# Patient Record
Sex: Male | Born: 1970 | Race: Black or African American | Hispanic: No | Marital: Single | Smoking: Current some day smoker
Health system: Southern US, Community
[De-identification: ages and names within clinical notes are randomized; demographics above are authoritative.]

## PROBLEM LIST (undated history)

## (undated) DIAGNOSIS — F32A Depression, unspecified: Secondary | ICD-10-CM

## (undated) DIAGNOSIS — F329 Major depressive disorder, single episode, unspecified: Secondary | ICD-10-CM

## (undated) DIAGNOSIS — Z789 Other specified health status: Secondary | ICD-10-CM

## (undated) DIAGNOSIS — F1910 Other psychoactive substance abuse, uncomplicated: Secondary | ICD-10-CM

## (undated) DIAGNOSIS — F209 Schizophrenia, unspecified: Secondary | ICD-10-CM

## (undated) DIAGNOSIS — I4891 Unspecified atrial fibrillation: Secondary | ICD-10-CM

## (undated) DIAGNOSIS — F319 Bipolar disorder, unspecified: Secondary | ICD-10-CM

## (undated) DIAGNOSIS — J449 Chronic obstructive pulmonary disease, unspecified: Secondary | ICD-10-CM

## (undated) DIAGNOSIS — F419 Anxiety disorder, unspecified: Secondary | ICD-10-CM

## (undated) HISTORY — PX: NO PAST SURGERIES: SHX2092

---

## 2006-06-27 ENCOUNTER — Emergency Department: Payer: Self-pay | Admitting: Internal Medicine

## 2006-09-30 ENCOUNTER — Other Ambulatory Visit: Payer: Self-pay

## 2006-09-30 ENCOUNTER — Emergency Department: Payer: Self-pay | Admitting: Emergency Medicine

## 2008-07-23 ENCOUNTER — Emergency Department (HOSPITAL_COMMUNITY): Admission: EM | Admit: 2008-07-23 | Discharge: 2008-07-23 | Payer: Self-pay | Admitting: Emergency Medicine

## 2008-10-29 ENCOUNTER — Emergency Department (HOSPITAL_COMMUNITY): Admission: EM | Admit: 2008-10-29 | Discharge: 2008-10-29 | Payer: Self-pay | Admitting: Emergency Medicine

## 2008-12-30 ENCOUNTER — Emergency Department (HOSPITAL_COMMUNITY): Admission: EM | Admit: 2008-12-30 | Discharge: 2008-12-30 | Payer: Self-pay | Admitting: Emergency Medicine

## 2009-02-02 ENCOUNTER — Ambulatory Visit: Payer: Self-pay | Admitting: Infectious Diseases

## 2009-02-02 ENCOUNTER — Observation Stay (HOSPITAL_COMMUNITY): Admission: EM | Admit: 2009-02-02 | Discharge: 2009-02-03 | Payer: Self-pay | Admitting: Emergency Medicine

## 2009-02-20 ENCOUNTER — Ambulatory Visit: Payer: Self-pay | Admitting: Psychiatry

## 2009-02-20 ENCOUNTER — Emergency Department (HOSPITAL_COMMUNITY): Admission: EM | Admit: 2009-02-20 | Discharge: 2009-02-20 | Payer: Self-pay | Admitting: Emergency Medicine

## 2009-02-20 ENCOUNTER — Inpatient Hospital Stay (HOSPITAL_COMMUNITY): Admission: RE | Admit: 2009-02-20 | Discharge: 2009-02-24 | Payer: Self-pay | Admitting: Psychiatry

## 2009-04-30 ENCOUNTER — Emergency Department (HOSPITAL_COMMUNITY): Admission: EM | Admit: 2009-04-30 | Discharge: 2009-04-30 | Payer: Self-pay | Admitting: Emergency Medicine

## 2009-05-06 ENCOUNTER — Emergency Department (HOSPITAL_COMMUNITY): Admission: EM | Admit: 2009-05-06 | Discharge: 2009-05-06 | Payer: Self-pay | Admitting: Emergency Medicine

## 2009-07-24 ENCOUNTER — Emergency Department (HOSPITAL_COMMUNITY): Admission: EM | Admit: 2009-07-24 | Discharge: 2009-07-25 | Payer: Self-pay | Admitting: Emergency Medicine

## 2009-07-25 ENCOUNTER — Inpatient Hospital Stay (HOSPITAL_COMMUNITY): Admission: EM | Admit: 2009-07-25 | Discharge: 2009-07-28 | Payer: Self-pay | Admitting: Psychiatry

## 2009-07-25 ENCOUNTER — Ambulatory Visit: Payer: Self-pay | Admitting: Psychiatry

## 2009-08-02 ENCOUNTER — Emergency Department: Payer: Self-pay | Admitting: Internal Medicine

## 2009-08-02 ENCOUNTER — Other Ambulatory Visit: Payer: Self-pay

## 2009-08-13 ENCOUNTER — Emergency Department (HOSPITAL_COMMUNITY): Admission: EM | Admit: 2009-08-13 | Discharge: 2009-08-14 | Payer: Self-pay | Admitting: Emergency Medicine

## 2009-08-14 ENCOUNTER — Inpatient Hospital Stay (HOSPITAL_COMMUNITY): Admission: AD | Admit: 2009-08-14 | Discharge: 2009-08-18 | Payer: Self-pay | Admitting: Psychiatry

## 2009-09-22 ENCOUNTER — Emergency Department: Payer: Self-pay | Admitting: Unknown Physician Specialty

## 2009-10-04 ENCOUNTER — Inpatient Hospital Stay (HOSPITAL_COMMUNITY): Admission: AD | Admit: 2009-10-04 | Discharge: 2009-10-07 | Payer: Self-pay | Admitting: Psychiatry

## 2009-10-04 ENCOUNTER — Ambulatory Visit: Payer: Self-pay | Admitting: Psychiatry

## 2009-10-04 ENCOUNTER — Emergency Department (HOSPITAL_COMMUNITY): Admission: EM | Admit: 2009-10-04 | Discharge: 2009-10-04 | Payer: Self-pay | Admitting: Emergency Medicine

## 2009-11-12 ENCOUNTER — Ambulatory Visit: Payer: Self-pay | Admitting: Psychiatry

## 2009-11-12 ENCOUNTER — Emergency Department (HOSPITAL_COMMUNITY): Admission: EM | Admit: 2009-11-12 | Discharge: 2009-11-12 | Payer: Self-pay | Admitting: Emergency Medicine

## 2009-11-12 ENCOUNTER — Inpatient Hospital Stay (HOSPITAL_COMMUNITY): Admission: EM | Admit: 2009-11-12 | Discharge: 2009-11-17 | Payer: Self-pay | Admitting: Psychiatry

## 2009-12-18 ENCOUNTER — Emergency Department (HOSPITAL_COMMUNITY)
Admission: EM | Admit: 2009-12-18 | Discharge: 2009-12-19 | Payer: Self-pay | Source: Home / Self Care | Admitting: Emergency Medicine

## 2009-12-30 ENCOUNTER — Inpatient Hospital Stay: Payer: Self-pay | Admitting: Psychiatry

## 2010-01-31 ENCOUNTER — Inpatient Hospital Stay: Payer: Self-pay | Admitting: Psychiatry

## 2010-02-17 ENCOUNTER — Emergency Department (HOSPITAL_COMMUNITY)
Admission: EM | Admit: 2010-02-17 | Discharge: 2010-02-18 | Disposition: A | Discharge status: Other Acute Inpatient Hospital | Payer: Self-pay | Attending: Emergency Medicine | Admitting: Emergency Medicine

## 2010-02-17 DIAGNOSIS — R45851 Suicidal ideations: Secondary | ICD-10-CM | POA: Insufficient documentation

## 2010-02-17 LAB — URINALYSIS, ROUTINE W REFLEX MICROSCOPIC
Bilirubin Urine: NEGATIVE
Hgb urine dipstick: NEGATIVE
Nitrite: NEGATIVE
Protein, ur: NEGATIVE mg/dL
Specific Gravity, Urine: 1.02 (ref 1.005–1.030)
Urine Glucose, Fasting: NEGATIVE mg/dL
Urobilinogen, UA: 1 mg/dL (ref 0.0–1.0)
pH: 5 (ref 5.0–8.0)

## 2010-02-17 LAB — CBC
HCT: 40.8 % (ref 39.0–52.0)
Hemoglobin: 14.3 g/dL (ref 13.0–17.0)
MCH: 29.9 pg (ref 26.0–34.0)
MCHC: 35 g/dL (ref 30.0–36.0)
MCV: 85.2 fL (ref 78.0–100.0)
Platelets: 336 10*3/uL (ref 150–400)
RBC: 4.79 MIL/uL (ref 4.22–5.81)
RDW: 14.5 % (ref 11.5–15.5)
WBC: 8.3 10*3/uL (ref 4.0–10.5)

## 2010-02-17 LAB — DIFFERENTIAL
Basophils Absolute: 0.1 10*3/uL (ref 0.0–0.1)
Basophils Relative: 1 % (ref 0–1)
Eosinophils Absolute: 0.8 10*3/uL — ABNORMAL HIGH (ref 0.0–0.7)
Eosinophils Relative: 10 % — ABNORMAL HIGH (ref 0–5)
Lymphocytes Relative: 34 % (ref 12–46)
Lymphs Abs: 2.8 10*3/uL (ref 0.7–4.0)
Monocytes Absolute: 0.5 10*3/uL (ref 0.1–1.0)
Monocytes Relative: 6 % (ref 3–12)
Neutro Abs: 4 10*3/uL (ref 1.7–7.7)
Neutrophils Relative %: 49 % (ref 43–77)

## 2010-02-17 LAB — BASIC METABOLIC PANEL
BUN: 13 mg/dL (ref 6–23)
CO2: 25 mEq/L (ref 19–32)
Calcium: 9 mg/dL (ref 8.4–10.5)
Chloride: 104 mEq/L (ref 96–112)
Creatinine, Ser: 0.83 mg/dL (ref 0.4–1.5)
GFR calc Af Amer: >60 mL/min (ref >60)
GFR calc non Af Amer: >60 mL/min (ref >60)
Glucose, Bld: 79 mg/dL (ref 70–99)
Potassium: 3.9 mEq/L (ref 3.5–5.1)
Sodium: 141 mEq/L (ref 135–145)

## 2010-02-17 LAB — RAPID URINE DRUG SCREEN, HOSP PERFORMED
Amphetamines: NOT DETECTED
Barbiturates: NOT DETECTED
Benzodiazepines: NOT DETECTED
Cocaine: POSITIVE — AB
Opiates: NOT DETECTED
Tetrahydrocannabinol: POSITIVE — AB

## 2010-02-17 LAB — ETHANOL: Alcohol, Ethyl (B): 134 mg/dL — ABNORMAL HIGH (ref 0–10)

## 2010-02-18 ENCOUNTER — Inpatient Hospital Stay (HOSPITAL_COMMUNITY)
Admission: AD | Admit: 2010-02-18 | Discharge: 2010-02-23 | DRG: 897 | Disposition: A | Discharge status: Home / Self Care | Payer: PRIVATE HEALTH INSURANCE | Source: Ambulatory Visit | Attending: Psychiatry | Admitting: Psychiatry

## 2010-02-18 DIAGNOSIS — F29 Unspecified psychosis not due to a substance or known physiological condition: Secondary | ICD-10-CM

## 2010-02-18 DIAGNOSIS — R45851 Suicidal ideations: Secondary | ICD-10-CM

## 2010-02-18 DIAGNOSIS — F1020 Alcohol dependence, uncomplicated: Secondary | ICD-10-CM

## 2010-02-18 DIAGNOSIS — Z5900 Homelessness unspecified: Secondary | ICD-10-CM

## 2010-02-18 DIAGNOSIS — F319 Bipolar disorder, unspecified: Secondary | ICD-10-CM

## 2010-02-18 DIAGNOSIS — F1410 Cocaine abuse, uncomplicated: Secondary | ICD-10-CM

## 2010-02-18 DIAGNOSIS — F1994 Other psychoactive substance use, unspecified with psychoactive substance-induced mood disorder: Secondary | ICD-10-CM

## 2010-02-19 DIAGNOSIS — F1020 Alcohol dependence, uncomplicated: Secondary | ICD-10-CM

## 2010-02-24 NOTE — H&P (Signed)
NAMEJACQUISE, RARICK               ACCOUNT NO.:  [id number]  MEDICAL RECORD NO.:  [id number]           PATIENT TYPE:  I  LOCATION:  0305                          FACILITY:  [facility]  PHYSICIAN:  Anselm Jungling, MD  DATE OF BIRTH:  [birth year]  DATE OF ADMISSION:  02/18/2010 DATE OF DISCHARGE:                      PSYCHIATRIC ADMISSION ASSESSMENT   IDENTIFYING INFORMATION:  A 40 year old African American male, voluntary admission.  HISTORY OF PRESENT ILLNESS:  This is one of several [facility] admissions for Skylor, who was last with [place] in November of 2011, this is his fifth admission this year, presents having suicidal thoughts and auditory hallucinations, which he is attributing to the cocaine that he has been using.  He initially presented in the emergency room with some chest fullness and was medically cleared with no acute cardiac findings.  He endorses using cocaine and last used about an hour before presentation. He reports that he has been feeling suicidal thoughts about overdosing on a bottle of Seroquel that he has at home.  He also used alcohol this morning and has been drinking fairly regularly.  He reports drinking about a 12-pack of beer and using 100 dollars worth of crack cocaine several days a week for several weeks.  He had also stopped all of his medications for at least a month, which included previous prescriptions for Seroquel and Lexapro.  PAST PSYCHIATRIC HISTORY:  The fifth or so [facility] admission within the past year, persistent use of alcohol and cocaine.  Has been referred in the past to [facility].  SOCIAL HISTORY:  This is a single [place] American male, who had previously been living with his family.  No known legal problems.  He is unemployed.  A previous discharge plan of going to an [facility], but it is not clear if he followed through.  He does endorse a history of suicide attempts, which he will not detail.  FAMILY HISTORY:  Negative  for mental illness or substance abuse.  MEDICAL HISTORY:  No regular primary care provider.  MEDICAL PROBLEMS:  Chest pain associated with cocaine abuse with negative medical assessment.  MEDICATIONS:  None.  DRUG ALLERGIES:  None.  Physical exam was done in the emergency room and as noted in the record. Notably, his alcohol level was 134 mg/dL.  Basic metabolic panel normal, BUN 13, creatinine 0.83.  CBC is normal with a hemoglobin of 14.3. Urine drug screen positive for cocaine.  MENTAL STATUS EXAM:  Fully alert male, pleasant, cooperative, in full contact with reality today.  Endorsing auditory hallucinations associated with the cocaine.  He is disheveled and tremulous in appearance.  No overt evidence of a thought disorder or psychosis, asking for help, memory intact, fully oriented.  DIAGNOSES:  AXIS I: 1. Alcohol dependence. 2. Cocaine abuse, rule out dependence. 3. Rule out substance-induced psychosis.  AXIS II:  No diagnosis.  AXIS III:  No diagnosis.  AXIS IV:  Deferred. AXIS V:  Current 40, past year not known.  Plan is to voluntarily admit him with the goal of a safe detox and alleviating his psychotic symptoms.  We have placed him on  Risperdal 1 mg t.i.d. to address his auditory hallucinations and he is on a Librium detox protocol.  He is assigned to our detox unit.     Margaret A. Lorin Picket, N.P.   ______________________________ Anselm Jungling, MD    MAS/MEDQ  D:  02/19/2010  T:  02/19/2010  Job:  [id number]  Electronically Signed by Kari Baars N.P. on 02/23/2010 12:11:57 PM Electronically Signed by Geralyn Flash MD on 02/24/2010 08:31:27 AM

## 2010-02-25 NOTE — Discharge Summary (Signed)
  [name], [name]               ACCOUNT NO.:  [id number]  MEDICAL RECORD NO.:  [id number]           PATIENT TYPE:  I  LOCATION:  0305                          FACILITY:  BH  PHYSICIAN:  Franklin Jungling, MD  DATE OF BIRTH:  Oct 12, 1970  DATE OF ADMISSION:  02/18/2010 DATE OF DISCHARGE:  02/23/2010                              DISCHARGE SUMMARY   IDENTIFYING DATA AND REASON FOR ADMISSION:  This was one of several [facility] admissions for Franklin [name], a 40 year old single, homeless African American male.  He was admitted for treatment of alcohol dependence and cocaine abuse.  Please refer to the admission note for further details pertaining to the symptoms, circumstances, and history that led to his hospitalization.  He was given an initial Axis I diagnosis of alcohol dependence, cocaine abuse, and substance-induced mood disorder.  MEDICAL AND LABORATORY:  He was medically and physically assessed by the psychiatric nurse practitioner.  He was in good health without any active or chronic medical problems.  There were no significant medical issues during his stay.  HOSPITAL COURSE:  The patient was admitted to the Adult Inpatient Psychiatric Service.  He presented as a well-nourished, normally- developed adult male who was in alcohol withdrawal.  He was detoxified using standard alcohol withdrawal protocol based upon a Librium taper. He had previously done well on a regimen of Geodon, and this was restarted at 40 mg b.i.d.  He participated in therapeutic groups and activities, including those geared towards 12-step recovery.  He participated in discharge and after-care planning in conjunction with the case manager.  He completed his detoxification protocol, was absent any suicide risk factors, and on the sixth hospital day was appropriate for discharge. He agreed to the following after-care plan.  AFTER-CARE:  The patient was to follow up at [facility], and was also  referred to the __________  Program.  His plan was to stay with his mother in the interim while these treatment options were being accessed.  DISCHARGE MEDICATIONS:  Geodon 40 mg b.i.d.  DISCHARGE DIAGNOSES:  Axis I:  1.  Alcohol dependence.  2.  Cocaine abuse.  3.  Substance-induced mood disorder.  Rule out bipolar disorder not otherwise specified. Axis II:  Deferred. Axis III:  No acute or chronic illnesses. Axis IV:  Stressors severe. Axis V:  Global assessment of functioning on discharge 50.     Franklin Jungling, MD     SPB/MEDQ  D:  02/24/2010  T:  02/24/2010  Job:  [id number]  Electronically Signed by Franklin Flash MD on 02/25/2010 01:24:35 PM

## 2010-03-15 ENCOUNTER — Emergency Department (HOSPITAL_COMMUNITY)
Admission: EM | Admit: 2010-03-15 | Discharge: 2010-03-17 | Disposition: A | Discharge status: Home / Self Care | Payer: PRIVATE HEALTH INSURANCE | Attending: Emergency Medicine | Admitting: Emergency Medicine

## 2010-03-15 DIAGNOSIS — R45851 Suicidal ideations: Secondary | ICD-10-CM | POA: Insufficient documentation

## 2010-03-15 DIAGNOSIS — F1910 Other psychoactive substance abuse, uncomplicated: Secondary | ICD-10-CM | POA: Insufficient documentation

## 2010-03-15 DIAGNOSIS — F29 Unspecified psychosis not due to a substance or known physiological condition: Secondary | ICD-10-CM | POA: Insufficient documentation

## 2010-03-15 DIAGNOSIS — F3289 Other specified depressive episodes: Secondary | ICD-10-CM | POA: Insufficient documentation

## 2010-03-15 DIAGNOSIS — F329 Major depressive disorder, single episode, unspecified: Secondary | ICD-10-CM | POA: Insufficient documentation

## 2010-03-15 LAB — COMPREHENSIVE METABOLIC PANEL
ALT: 23 U/L (ref 0–53)
AST: 22 U/L (ref 0–37)
Albumin: 4.4 g/dL (ref 3.5–5.2)
Alkaline Phosphatase: 60 U/L (ref 39–117)
BUN: 6 mg/dL (ref 6–23)
CO2: 30 mEq/L (ref 19–32)
Calcium: 9.7 mg/dL (ref 8.4–10.5)
Chloride: 101 mEq/L (ref 96–112)
Creatinine, Ser: 0.9 mg/dL (ref 0.4–1.5)
GFR calc Af Amer: >60 mL/min (ref >60)
GFR calc non Af Amer: >60 mL/min (ref >60)
Glucose, Bld: 111 mg/dL — ABNORMAL HIGH (ref 70–99)
Potassium: 4.1 mEq/L (ref 3.5–5.1)
Sodium: 141 mEq/L (ref 135–145)
Total Bilirubin: 1.1 mg/dL (ref 0.3–1.2)
Total Protein: 7.8 g/dL (ref 6.0–8.3)

## 2010-03-15 LAB — DIFFERENTIAL
Basophils Absolute: 0.1 10*3/uL (ref 0.0–0.1)
Basophils Relative: 1 % (ref 0–1)
Eosinophils Absolute: 0.1 10*3/uL (ref 0.0–0.7)
Eosinophils Relative: 1 % (ref 0–5)
Lymphocytes Relative: 19 % (ref 12–46)
Lymphs Abs: 1.6 10*3/uL (ref 0.7–4.0)
Monocytes Absolute: 0.5 10*3/uL (ref 0.1–1.0)
Monocytes Relative: 6 % (ref 3–12)
Neutro Abs: 6 10*3/uL (ref 1.7–7.7)
Neutrophils Relative %: 73 % (ref 43–77)

## 2010-03-15 LAB — URINALYSIS, ROUTINE W REFLEX MICROSCOPIC
Bilirubin Urine: NEGATIVE
Glucose, UA: NEGATIVE mg/dL
Hgb urine dipstick: NEGATIVE
Ketones, ur: NEGATIVE mg/dL
Nitrite: NEGATIVE
Protein, ur: NEGATIVE mg/dL
Specific Gravity, Urine: 1.015 (ref 1.005–1.030)
Urobilinogen, UA: 0.2 mg/dL (ref 0.0–1.0)
pH: 6.5 (ref 5.0–8.0)

## 2010-03-15 LAB — CBC
HCT: 43.6 % (ref 39.0–52.0)
Hemoglobin: 14.3 g/dL (ref 13.0–17.0)
MCH: 29 pg (ref 26.0–34.0)
MCHC: 32.8 g/dL (ref 30.0–36.0)
MCV: 88.4 fL (ref 78.0–100.0)
Platelets: 321 10*3/uL (ref 150–400)
RBC: 4.93 MIL/uL (ref 4.22–5.81)
RDW: 14.5 % (ref 11.5–15.5)
WBC: 8.2 10*3/uL (ref 4.0–10.5)

## 2010-03-15 LAB — RAPID URINE DRUG SCREEN, HOSP PERFORMED
Amphetamines: NOT DETECTED
Barbiturates: NOT DETECTED
Benzodiazepines: NOT DETECTED
Cocaine: POSITIVE — AB
Opiates: NOT DETECTED
Tetrahydrocannabinol: NOT DETECTED

## 2010-03-15 LAB — ACETAMINOPHEN LEVEL: Acetaminophen (Tylenol), Serum: <10 ug/mL — ABNORMAL LOW (ref 10–30)

## 2010-03-15 LAB — SALICYLATE LEVEL: Salicylate Lvl: <4 mg/dL (ref 2.8–20.0)

## 2010-03-15 LAB — ETHANOL: Alcohol, Ethyl (B): 58 mg/dL — ABNORMAL HIGH (ref 0–10)

## 2010-03-16 DIAGNOSIS — F329 Major depressive disorder, single episode, unspecified: Secondary | ICD-10-CM

## 2010-03-16 LAB — RAPID URINE DRUG SCREEN, HOSP PERFORMED
Amphetamines: NOT DETECTED
Barbiturates: NOT DETECTED
Benzodiazepines: NOT DETECTED
Cocaine: POSITIVE — AB
Opiates: NOT DETECTED
Tetrahydrocannabinol: NOT DETECTED

## 2010-03-16 LAB — DIFFERENTIAL
Basophils Absolute: 0.1 10*3/uL (ref 0.0–0.1)
Basophils Relative: 1 % (ref 0–1)
Eosinophils Absolute: 1.1 10*3/uL — ABNORMAL HIGH (ref 0.0–0.7)
Eosinophils Relative: 11 % — ABNORMAL HIGH (ref 0–5)
Lymphocytes Relative: 25 % (ref 12–46)
Lymphs Abs: 2.3 10*3/uL (ref 0.7–4.0)
Monocytes Absolute: 0.7 10*3/uL (ref 0.1–1.0)
Monocytes Relative: 8 % (ref 3–12)
Neutro Abs: 5.1 10*3/uL (ref 1.7–7.7)
Neutrophils Relative %: 55 % (ref 43–77)

## 2010-03-16 LAB — CBC
HCT: 44.8 % (ref 39.0–52.0)
Hemoglobin: 15.3 g/dL (ref 13.0–17.0)
MCH: 29.9 pg (ref 26.0–34.0)
MCHC: 34.2 g/dL (ref 30.0–36.0)
MCV: 87.5 fL (ref 78.0–100.0)
Platelets: 308 10*3/uL (ref 150–400)
RBC: 5.12 MIL/uL (ref 4.22–5.81)
RDW: 13.6 % (ref 11.5–15.5)
WBC: 9.3 10*3/uL (ref 4.0–10.5)

## 2010-03-16 LAB — POCT CARDIAC MARKERS
CKMB, poc: <1 ng/mL — ABNORMAL LOW (ref 1.0–8.0)
Myoglobin, poc: 41.5 ng/mL (ref 12–200)
Troponin i, poc: <0.05 ng/mL (ref 0.00–0.09)

## 2010-03-16 LAB — BASIC METABOLIC PANEL
BUN: 4 mg/dL — ABNORMAL LOW (ref 6–23)
CO2: 29 mEq/L (ref 19–32)
Calcium: 9.9 mg/dL (ref 8.4–10.5)
Chloride: 102 mEq/L (ref 96–112)
Creatinine, Ser: 0.83 mg/dL (ref 0.4–1.5)
GFR calc Af Amer: >60 mL/min (ref >60)
GFR calc non Af Amer: >60 mL/min (ref >60)
Glucose, Bld: 80 mg/dL (ref 70–99)
Potassium: 4.3 mEq/L (ref 3.5–5.1)
Sodium: 140 mEq/L (ref 135–145)

## 2010-03-16 LAB — TRICYCLICS SCREEN, URINE: TCA Scrn: NOT DETECTED

## 2010-03-16 LAB — ETHANOL: Alcohol, Ethyl (B): 45 mg/dL — ABNORMAL HIGH (ref 0–10)

## 2010-03-17 DIAGNOSIS — F329 Major depressive disorder, single episode, unspecified: Secondary | ICD-10-CM

## 2010-03-17 LAB — CBC
HCT: 41.6 % (ref 39.0–52.0)
Hemoglobin: 14.1 g/dL (ref 13.0–17.0)
MCH: 30.7 pg (ref 26.0–34.0)
MCHC: 33.9 g/dL (ref 30.0–36.0)
MCV: 90.4 fL (ref 78.0–100.0)
Platelets: 278 10*3/uL (ref 150–400)
RBC: 4.6 MIL/uL (ref 4.22–5.81)
RDW: 14.6 % (ref 11.5–15.5)
WBC: 6.8 10*3/uL (ref 4.0–10.5)

## 2010-03-17 LAB — BASIC METABOLIC PANEL
BUN: 7 mg/dL (ref 6–23)
CO2: 27 mEq/L (ref 19–32)
Calcium: 9.4 mg/dL (ref 8.4–10.5)
Chloride: 105 mEq/L (ref 96–112)
Creatinine, Ser: 1.01 mg/dL (ref 0.4–1.5)
GFR calc Af Amer: >60 mL/min (ref >60)
GFR calc non Af Amer: >60 mL/min (ref >60)
Glucose, Bld: 73 mg/dL (ref 70–99)
Potassium: 4 mEq/L (ref 3.5–5.1)
Sodium: 142 mEq/L (ref 135–145)

## 2010-03-17 LAB — RAPID URINE DRUG SCREEN, HOSP PERFORMED
Amphetamines: NOT DETECTED
Barbiturates: NOT DETECTED
Benzodiazepines: NOT DETECTED
Cocaine: POSITIVE — AB
Opiates: NOT DETECTED
Tetrahydrocannabinol: POSITIVE — AB

## 2010-03-17 LAB — ETHANOL: Alcohol, Ethyl (B): <5 mg/dL (ref 0–10)

## 2010-03-17 LAB — DIFFERENTIAL
Basophils Absolute: 0.1 10*3/uL (ref 0.0–0.1)
Basophils Relative: 1 % (ref 0–1)
Eosinophils Absolute: 0.5 10*3/uL (ref 0.0–0.7)
Eosinophils Relative: 7 % — ABNORMAL HIGH (ref 0–5)
Lymphocytes Relative: 24 % (ref 12–46)
Lymphs Abs: 1.6 10*3/uL (ref 0.7–4.0)
Monocytes Absolute: 0.4 10*3/uL (ref 0.1–1.0)
Monocytes Relative: 6 % (ref 3–12)
Neutro Abs: 4.2 10*3/uL (ref 1.7–7.7)
Neutrophils Relative %: 62 % (ref 43–77)

## 2010-03-17 LAB — TRICYCLICS SCREEN, URINE: TCA Scrn: NOT DETECTED

## 2010-03-19 LAB — CBC
HCT: 43.6 % (ref 39.0–52.0)
Hemoglobin: 14.8 g/dL (ref 13.0–17.0)
MCH: 30.4 pg (ref 26.0–34.0)
MCHC: 33.9 g/dL (ref 30.0–36.0)
MCV: 89.7 fL (ref 78.0–100.0)
Platelets: 274 10*3/uL (ref 150–400)
RBC: 4.86 MIL/uL (ref 4.22–5.81)
RDW: 16.3 % — ABNORMAL HIGH (ref 11.5–15.5)
WBC: 5.2 10*3/uL (ref 4.0–10.5)

## 2010-03-19 LAB — COMPREHENSIVE METABOLIC PANEL
ALT: 20 U/L (ref 0–53)
AST: 28 U/L (ref 0–37)
Albumin: 4.2 g/dL (ref 3.5–5.2)
Alkaline Phosphatase: 68 U/L (ref 39–117)
BUN: 6 mg/dL (ref 6–23)
CO2: 31 mEq/L (ref 19–32)
Calcium: 9.4 mg/dL (ref 8.4–10.5)
Chloride: 103 mEq/L (ref 96–112)
Creatinine, Ser: 0.86 mg/dL (ref 0.4–1.5)
GFR calc Af Amer: >60 mL/min (ref >60)
GFR calc non Af Amer: >60 mL/min (ref >60)
Glucose, Bld: 109 mg/dL — ABNORMAL HIGH (ref 70–99)
Potassium: 4 mEq/L (ref 3.5–5.1)
Sodium: 142 mEq/L (ref 135–145)
Total Bilirubin: 0.6 mg/dL (ref 0.3–1.2)
Total Protein: 7.5 g/dL (ref 6.0–8.3)

## 2010-03-19 LAB — RAPID URINE DRUG SCREEN, HOSP PERFORMED
Amphetamines: NOT DETECTED
Barbiturates: NOT DETECTED
Benzodiazepines: POSITIVE — AB
Cocaine: POSITIVE — AB
Opiates: NOT DETECTED
Tetrahydrocannabinol: NOT DETECTED

## 2010-03-19 LAB — DIFFERENTIAL
Basophils Absolute: 0 10*3/uL (ref 0.0–0.1)
Basophils Relative: 1 % (ref 0–1)
Eosinophils Absolute: 0.3 10*3/uL (ref 0.0–0.7)
Eosinophils Relative: 6 % — ABNORMAL HIGH (ref 0–5)
Lymphocytes Relative: 27 % (ref 12–46)
Lymphs Abs: 1.4 10*3/uL (ref 0.7–4.0)
Monocytes Absolute: 0.4 10*3/uL (ref 0.1–1.0)
Monocytes Relative: 7 % (ref 3–12)
Neutro Abs: 3.1 10*3/uL (ref 1.7–7.7)
Neutrophils Relative %: 60 % (ref 43–77)

## 2010-03-19 LAB — ETHANOL: Alcohol, Ethyl (B): 9 mg/dL (ref 0–10)

## 2010-03-20 LAB — URINALYSIS, ROUTINE W REFLEX MICROSCOPIC
Bilirubin Urine: NEGATIVE
Glucose, UA: NEGATIVE mg/dL
Hgb urine dipstick: NEGATIVE
Ketones, ur: NEGATIVE mg/dL
Nitrite: NEGATIVE
Protein, ur: NEGATIVE mg/dL
Specific Gravity, Urine: 1.024 (ref 1.005–1.030)
Urobilinogen, UA: 1 mg/dL (ref 0.0–1.0)
pH: 5 (ref 5.0–8.0)

## 2010-03-20 LAB — CBC
HCT: 40 % (ref 39.0–52.0)
Hemoglobin: 13.8 g/dL (ref 13.0–17.0)
MCH: 30.8 pg (ref 26.0–34.0)
MCHC: 34.5 g/dL (ref 30.0–36.0)
MCV: 89.1 fL (ref 78.0–100.0)
Platelets: 338 10*3/uL (ref 150–400)
RBC: 4.49 MIL/uL (ref 4.22–5.81)
RDW: 14.6 % (ref 11.5–15.5)
WBC: 7.6 10*3/uL (ref 4.0–10.5)

## 2010-03-20 LAB — RAPID URINE DRUG SCREEN, HOSP PERFORMED
Amphetamines: NOT DETECTED
Barbiturates: NOT DETECTED
Benzodiazepines: NOT DETECTED
Cocaine: POSITIVE — AB
Opiates: NOT DETECTED
Tetrahydrocannabinol: NOT DETECTED

## 2010-03-20 LAB — COMPREHENSIVE METABOLIC PANEL
ALT: 19 U/L (ref 0–53)
AST: 22 U/L (ref 0–37)
Albumin: 4.1 g/dL (ref 3.5–5.2)
Alkaline Phosphatase: 60 U/L (ref 39–117)
BUN: 8 mg/dL (ref 6–23)
CO2: 25 mEq/L (ref 19–32)
Calcium: 9.2 mg/dL (ref 8.4–10.5)
Chloride: 103 mEq/L (ref 96–112)
Creatinine, Ser: 0.87 mg/dL (ref 0.4–1.5)
GFR calc Af Amer: >60 mL/min (ref >60)
GFR calc non Af Amer: >60 mL/min (ref >60)
Glucose, Bld: 144 mg/dL — ABNORMAL HIGH (ref 70–99)
Potassium: 3.4 mEq/L — ABNORMAL LOW (ref 3.5–5.1)
Sodium: 139 mEq/L (ref 135–145)
Total Bilirubin: 1.5 mg/dL — ABNORMAL HIGH (ref 0.3–1.2)
Total Protein: 6.8 g/dL (ref 6.0–8.3)

## 2010-03-20 LAB — DIFFERENTIAL
Basophils Absolute: 0.1 K/uL (ref 0.0–0.1)
Basophils Relative: 1 % (ref 0–1)
Eosinophils Absolute: 0.4 K/uL (ref 0.0–0.7)
Eosinophils Relative: 5 % (ref 0–5)
Lymphocytes Relative: 39 % (ref 12–46)
Lymphs Abs: 2.9 K/uL (ref 0.7–4.0)
Monocytes Absolute: 0.5 K/uL (ref 0.1–1.0)
Monocytes Relative: 6 % (ref 3–12)
Neutro Abs: 3.8 K/uL (ref 1.7–7.7)
Neutrophils Relative %: 49 % (ref 43–77)

## 2010-03-20 LAB — POCT CARDIAC MARKERS
CKMB, poc: <1 ng/mL — ABNORMAL LOW (ref 1.0–8.0)
Myoglobin, poc: 27.5 ng/mL (ref 12–200)
Troponin i, poc: <0.05 ng/mL (ref 0.00–0.09)

## 2010-03-20 LAB — CK TOTAL AND CKMB (NOT AT ARMC)
CK, MB: 2.9 ng/mL (ref 0.3–4.0)
Relative Index: 1.5 (ref 0.0–2.5)
Total CK: 197 U/L (ref 7–232)

## 2010-03-20 LAB — TROPONIN I: Troponin I: 0.02 ng/mL (ref 0.00–0.06)

## 2010-03-20 LAB — ETHANOL

## 2010-03-21 LAB — LIPID PANEL
Cholesterol: 172 mg/dL (ref 0–200)
HDL: 58 mg/dL (ref >39)
LDL Cholesterol: 83 mg/dL (ref 0–99)
Total CHOL/HDL Ratio: 3 RATIO
Triglycerides: 155 mg/dL — ABNORMAL HIGH (ref <150)
VLDL: 31 mg/dL (ref 0–40)

## 2010-03-21 LAB — CBC
HCT: 43.4 % (ref 39.0–52.0)
Hemoglobin: 14.8 g/dL (ref 13.0–17.0)
MCH: 30.3 pg (ref 26.0–34.0)
MCHC: 34.1 g/dL (ref 30.0–36.0)
MCV: 89 fL (ref 78.0–100.0)
Platelets: 297 10*3/uL (ref 150–400)
RBC: 4.88 MIL/uL (ref 4.22–5.81)
RDW: 13.9 % (ref 11.5–15.5)
WBC: 5.6 10*3/uL (ref 4.0–10.5)

## 2010-03-21 LAB — RAPID URINE DRUG SCREEN, HOSP PERFORMED
Amphetamines: NOT DETECTED
Barbiturates: NOT DETECTED
Benzodiazepines: NOT DETECTED
Cocaine: POSITIVE — AB
Opiates: NOT DETECTED
Tetrahydrocannabinol: NOT DETECTED

## 2010-03-21 LAB — DIFFERENTIAL
Basophils Absolute: 0.1 10*3/uL (ref 0.0–0.1)
Basophils Relative: 2 % — ABNORMAL HIGH (ref 0–1)
Eosinophils Absolute: 0.2 10*3/uL (ref 0.0–0.7)
Eosinophils Relative: 3 % (ref 0–5)
Lymphocytes Relative: 31 % (ref 12–46)
Lymphs Abs: 1.8 10*3/uL (ref 0.7–4.0)
Monocytes Absolute: 0.4 10*3/uL (ref 0.1–1.0)
Monocytes Relative: 8 % (ref 3–12)
Neutro Abs: 3.2 10*3/uL (ref 1.7–7.7)
Neutrophils Relative %: 56 % (ref 43–77)

## 2010-03-21 LAB — BASIC METABOLIC PANEL
BUN: 9 mg/dL (ref 6–23)
CO2: 30 mEq/L (ref 19–32)
Calcium: 9.1 mg/dL (ref 8.4–10.5)
Chloride: 107 mEq/L (ref 96–112)
Creatinine, Ser: 1.07 mg/dL (ref 0.4–1.5)
GFR calc Af Amer: >60 mL/min (ref >60)
GFR calc non Af Amer: >60 mL/min (ref >60)
Glucose, Bld: 105 mg/dL — ABNORMAL HIGH (ref 70–99)
Potassium: 4.1 mEq/L (ref 3.5–5.1)
Sodium: 142 mEq/L (ref 135–145)

## 2010-03-21 LAB — HEPATIC FUNCTION PANEL
ALT: 16 U/L (ref 0–53)
AST: 23 U/L (ref 0–37)
Albumin: 3.4 g/dL — ABNORMAL LOW (ref 3.5–5.2)
Alkaline Phosphatase: 54 U/L (ref 39–117)
Bilirubin, Direct: 0.1 mg/dL (ref 0.0–0.3)
Indirect Bilirubin: 0.6 mg/dL (ref 0.3–0.9)
Total Bilirubin: 0.7 mg/dL (ref 0.3–1.2)
Total Protein: 6 g/dL (ref 6.0–8.3)

## 2010-03-21 LAB — TSH: TSH: 2.677 u[IU]/mL (ref 0.350–4.500)

## 2010-03-21 LAB — ETHANOL: Alcohol, Ethyl (B): 6 mg/dL (ref 0–10)

## 2010-03-21 LAB — TRICYCLICS SCREEN, URINE: TCA Scrn: NOT DETECTED

## 2010-03-22 LAB — BASIC METABOLIC PANEL
BUN: 11 mg/dL (ref 6–23)
BUN: 14 mg/dL (ref 6–23)
CO2: 25 mEq/L (ref 19–32)
CO2: 30 mEq/L (ref 19–32)
Calcium: 9 mg/dL (ref 8.4–10.5)
Calcium: 9.1 mg/dL (ref 8.4–10.5)
Chloride: 101 mEq/L (ref 96–112)
Chloride: 105 mEq/L (ref 96–112)
Creatinine, Ser: 0.91 mg/dL (ref 0.4–1.5)
Creatinine, Ser: 1.01 mg/dL (ref 0.4–1.5)
GFR calc Af Amer: >60 mL/min (ref >60)
GFR calc Af Amer: >60 mL/min (ref >60)
GFR calc non Af Amer: >60 mL/min (ref >60)
GFR calc non Af Amer: >60 mL/min (ref >60)
Glucose, Bld: 111 mg/dL — ABNORMAL HIGH (ref 70–99)
Glucose, Bld: 87 mg/dL (ref 70–99)
Potassium: 3.7 mEq/L (ref 3.5–5.1)
Potassium: 3.7 mEq/L (ref 3.5–5.1)
Sodium: 136 mEq/L (ref 135–145)
Sodium: 141 mEq/L (ref 135–145)

## 2010-03-22 LAB — COMPREHENSIVE METABOLIC PANEL
ALT: 15 U/L (ref 0–53)
AST: 18 U/L (ref 0–37)
Albumin: 4 g/dL (ref 3.5–5.2)
Alkaline Phosphatase: 55 U/L (ref 39–117)
BUN: 8 mg/dL (ref 6–23)
CO2: 26 mEq/L (ref 19–32)
Calcium: 8.5 mg/dL (ref 8.4–10.5)
Chloride: 100 mEq/L (ref 96–112)
Creatinine, Ser: 0.73 mg/dL (ref 0.4–1.5)
GFR calc Af Amer: >60 mL/min (ref >60)
GFR calc non Af Amer: >60 mL/min (ref >60)
Glucose, Bld: 78 mg/dL (ref 70–99)
Potassium: 3.8 mEq/L (ref 3.5–5.1)
Sodium: 135 mEq/L (ref 135–145)
Total Bilirubin: 0.7 mg/dL (ref 0.3–1.2)
Total Protein: 6.5 g/dL (ref 6.0–8.3)

## 2010-03-22 LAB — CBC
HCT: 41 % (ref 39.0–52.0)
HCT: 45.6 % (ref 39.0–52.0)
Hemoglobin: 13.8 g/dL (ref 13.0–17.0)
Hemoglobin: 15.3 g/dL (ref 13.0–17.0)
MCHC: 33.6 g/dL (ref 30.0–36.0)
MCHC: 33.7 g/dL (ref 30.0–36.0)
MCV: 92.8 fL (ref 78.0–100.0)
MCV: 92.9 fL (ref 78.0–100.0)
Platelets: 232 10*3/uL (ref 150–400)
Platelets: 284 10*3/uL (ref 150–400)
RBC: 4.42 MIL/uL (ref 4.22–5.81)
RBC: 4.91 MIL/uL (ref 4.22–5.81)
RDW: 13.7 % (ref 11.5–15.5)
RDW: 14 % (ref 11.5–15.5)
WBC: 5.8 10*3/uL (ref 4.0–10.5)
WBC: 7.6 10*3/uL (ref 4.0–10.5)

## 2010-03-22 LAB — LIPID PANEL
Cholesterol: 133 mg/dL (ref 0–200)
HDL: 62 mg/dL (ref >39)
LDL Cholesterol: 60 mg/dL (ref 0–99)
Total CHOL/HDL Ratio: 2.1 RATIO
Triglycerides: 53 mg/dL (ref <150)
VLDL: 11 mg/dL (ref 0–40)

## 2010-03-22 LAB — DIFFERENTIAL
Basophils Absolute: 0 10*3/uL (ref 0.0–0.1)
Basophils Relative: 0 % (ref 0–1)
Eosinophils Absolute: 0.1 10*3/uL (ref 0.0–0.7)
Eosinophils Relative: 1 % (ref 0–5)
Lymphocytes Relative: 33 % (ref 12–46)
Lymphs Abs: 2.5 10*3/uL (ref 0.7–4.0)
Monocytes Absolute: 0.4 10*3/uL (ref 0.1–1.0)
Monocytes Relative: 5 % (ref 3–12)
Neutro Abs: 4.7 10*3/uL (ref 1.7–7.7)
Neutrophils Relative %: 61 % (ref 43–77)

## 2010-03-22 LAB — CARDIAC PANEL(CRET KIN+CKTOT+MB+TROPI)
CK, MB: 0.9 ng/mL (ref 0.3–4.0)
CK, MB: 1.3 ng/mL (ref 0.3–4.0)
Relative Index: INVALID (ref 0.0–2.5)
Relative Index: INVALID (ref 0.0–2.5)
Total CK: 56 U/L (ref 7–232)
Total CK: 67 U/L (ref 7–232)
Troponin I: 0.02 ng/mL (ref 0.00–0.06)
Troponin I: <0.01 ng/mL (ref 0.00–0.06)

## 2010-03-22 LAB — CK TOTAL AND CKMB (NOT AT ARMC)
CK, MB: 1.7 ng/mL (ref 0.3–4.0)
Relative Index: INVALID (ref 0.0–2.5)
Total CK: 79 U/L (ref 7–232)

## 2010-03-22 LAB — TSH: TSH: 0.801 u[IU]/mL (ref 0.350–4.500)

## 2010-03-22 LAB — RAPID URINE DRUG SCREEN, HOSP PERFORMED
Amphetamines: NOT DETECTED
Barbiturates: NOT DETECTED
Benzodiazepines: NOT DETECTED
Cocaine: POSITIVE — AB
Opiates: NOT DETECTED
Tetrahydrocannabinol: NOT DETECTED

## 2010-03-22 LAB — HEMOGLOBIN A1C
Hgb A1c MFr Bld: 5.7 % (ref 4.6–6.1)
Mean Plasma Glucose: 117 mg/dL

## 2010-03-22 LAB — POCT CARDIAC MARKERS
CKMB, poc: <1 ng/mL — ABNORMAL LOW (ref 1.0–8.0)
Myoglobin, poc: 20.7 ng/mL (ref 12–200)
Troponin i, poc: <0.05 ng/mL (ref 0.00–0.09)

## 2010-03-22 LAB — HEPATITIS C ANTIBODY: HCV Ab: NEGATIVE

## 2010-03-22 LAB — PROTIME-INR
INR: 1 (ref 0.00–1.49)
Prothrombin Time: 13.1 seconds (ref 11.6–15.2)

## 2010-03-22 LAB — APTT: aPTT: 26 seconds (ref 24–37)

## 2010-03-22 LAB — TROPONIN I: Troponin I: <0.01 ng/mL (ref 0.00–0.06)

## 2010-03-22 LAB — HEPATITIS B SURFACE ANTIBODY,QUALITATIVE: Hep B S Ab: NEGATIVE

## 2010-03-22 LAB — RPR: RPR Ser Ql: NONREACTIVE

## 2010-03-22 LAB — MAGNESIUM: Magnesium: 2.1 mg/dL (ref 1.5–2.5)

## 2010-03-22 LAB — ETHANOL: Alcohol, Ethyl (B): 71 mg/dL — ABNORMAL HIGH (ref 0–10)

## 2010-03-25 LAB — URINALYSIS, ROUTINE W REFLEX MICROSCOPIC
Bilirubin Urine: NEGATIVE
Glucose, UA: NEGATIVE mg/dL
Hgb urine dipstick: NEGATIVE
Nitrite: NEGATIVE
Protein, ur: NEGATIVE mg/dL
Specific Gravity, Urine: 1.022 (ref 1.005–1.030)
Urobilinogen, UA: 0.2 mg/dL (ref 0.0–1.0)
pH: 5.5 (ref 5.0–8.0)

## 2010-03-25 LAB — CBC
HCT: 41.1 % (ref 39.0–52.0)
Hemoglobin: 14.1 g/dL (ref 13.0–17.0)
MCHC: 34.2 g/dL (ref 30.0–36.0)
MCV: 89.5 fL (ref 78.0–100.0)
Platelets: 293 10*3/uL (ref 150–400)
RBC: 4.59 MIL/uL (ref 4.22–5.81)
RDW: 14 % (ref 11.5–15.5)
WBC: 6.9 10*3/uL (ref 4.0–10.5)

## 2010-03-25 LAB — DIFFERENTIAL
Basophils Absolute: 0 10*3/uL (ref 0.0–0.1)
Basophils Relative: 1 % (ref 0–1)
Eosinophils Absolute: 0.2 10*3/uL (ref 0.0–0.7)
Eosinophils Relative: 2 % (ref 0–5)
Lymphocytes Relative: 38 % (ref 12–46)
Lymphs Abs: 2.6 10*3/uL (ref 0.7–4.0)
Monocytes Absolute: 0.5 10*3/uL (ref 0.1–1.0)
Monocytes Relative: 8 % (ref 3–12)
Neutro Abs: 3.6 10*3/uL (ref 1.7–7.7)
Neutrophils Relative %: 51 % (ref 43–77)

## 2010-03-25 LAB — COMPREHENSIVE METABOLIC PANEL
ALT: 17 U/L (ref 0–53)
AST: 21 U/L (ref 0–37)
Albumin: 3.9 g/dL (ref 3.5–5.2)
Alkaline Phosphatase: 60 U/L (ref 39–117)
BUN: 10 mg/dL (ref 6–23)
CO2: 26 mEq/L (ref 19–32)
Calcium: 9.1 mg/dL (ref 8.4–10.5)
Chloride: 105 mEq/L (ref 96–112)
Creatinine, Ser: 0.77 mg/dL (ref 0.4–1.5)
GFR calc Af Amer: >60 mL/min (ref >60)
GFR calc non Af Amer: >60 mL/min (ref >60)
Glucose, Bld: 84 mg/dL (ref 70–99)
Potassium: 3.5 mEq/L (ref 3.5–5.1)
Sodium: 139 mEq/L (ref 135–145)
Total Bilirubin: 1 mg/dL (ref 0.3–1.2)
Total Protein: 6.7 g/dL (ref 6.0–8.3)

## 2010-03-25 LAB — RAPID URINE DRUG SCREEN, HOSP PERFORMED
Amphetamines: NOT DETECTED
Barbiturates: NOT DETECTED
Benzodiazepines: POSITIVE — AB
Cocaine: POSITIVE — AB
Opiates: NOT DETECTED
Tetrahydrocannabinol: NOT DETECTED

## 2010-03-25 LAB — ETHANOL: Alcohol, Ethyl (B): 10 mg/dL (ref 0–10)

## 2010-04-06 LAB — CBC
HCT: 44.3 % (ref 39.0–52.0)
Hemoglobin: 14.9 g/dL (ref 13.0–17.0)
MCHC: 33.6 g/dL (ref 30.0–36.0)
MCV: 91 fL (ref 78.0–100.0)
Platelets: 237 10*3/uL (ref 150–400)
RBC: 4.86 MIL/uL (ref 4.22–5.81)
RDW: 13.9 % (ref 11.5–15.5)
WBC: 10.1 10*3/uL (ref 4.0–10.5)

## 2010-04-06 LAB — DIFFERENTIAL
Basophils Absolute: 0 10*3/uL (ref 0.0–0.1)
Basophils Relative: 0 % (ref 0–1)
Eosinophils Absolute: 0.1 10*3/uL (ref 0.0–0.7)
Eosinophils Relative: 1 % (ref 0–5)
Lymphocytes Relative: 24 % (ref 12–46)
Lymphs Abs: 2.4 10*3/uL (ref 0.7–4.0)
Monocytes Absolute: 0.6 10*3/uL (ref 0.1–1.0)
Monocytes Relative: 6 % (ref 3–12)
Neutro Abs: 6.8 10*3/uL (ref 1.7–7.7)
Neutrophils Relative %: 68 % (ref 43–77)

## 2010-04-06 LAB — BASIC METABOLIC PANEL
BUN: 9 mg/dL (ref 6–23)
CO2: 29 mEq/L (ref 19–32)
Calcium: 9.4 mg/dL (ref 8.4–10.5)
Chloride: 101 mEq/L (ref 96–112)
Creatinine, Ser: 0.85 mg/dL (ref 0.4–1.5)
GFR calc Af Amer: >60 mL/min (ref >60)
GFR calc non Af Amer: >60 mL/min (ref >60)
Glucose, Bld: 94 mg/dL (ref 70–99)
Potassium: 4 mEq/L (ref 3.5–5.1)
Sodium: 137 mEq/L (ref 135–145)

## 2010-04-06 LAB — POCT CARDIAC MARKERS
CKMB, poc: 1.9 ng/mL (ref 1.0–8.0)
CKMB, poc: 2 ng/mL (ref 1.0–8.0)
Myoglobin, poc: 90.9 ng/mL (ref 12–200)
Myoglobin, poc: 93.6 ng/mL (ref 12–200)
Troponin i, poc: <0.05 ng/mL (ref 0.00–0.09)
Troponin i, poc: <0.05 ng/mL (ref 0.00–0.09)

## 2010-04-09 ENCOUNTER — Emergency Department (HOSPITAL_COMMUNITY): Payer: Self-pay

## 2010-04-09 ENCOUNTER — Emergency Department (HOSPITAL_COMMUNITY)
Admission: EM | Admit: 2010-04-09 | Discharge: 2010-04-09 | Disposition: A | Discharge status: Other Acute Inpatient Hospital | Payer: Self-pay | Attending: Emergency Medicine | Admitting: Emergency Medicine

## 2010-04-09 ENCOUNTER — Inpatient Hospital Stay (HOSPITAL_COMMUNITY)
Admission: AD | Admit: 2010-04-09 | Discharge: 2010-04-13 | DRG: 897 | Disposition: A | Discharge status: Home / Self Care | Payer: PRIVATE HEALTH INSURANCE | Source: Ambulatory Visit | Attending: Psychiatry | Admitting: Psychiatry

## 2010-04-09 DIAGNOSIS — F341 Dysthymic disorder: Secondary | ICD-10-CM | POA: Insufficient documentation

## 2010-04-09 DIAGNOSIS — F1010 Alcohol abuse, uncomplicated: Secondary | ICD-10-CM | POA: Insufficient documentation

## 2010-04-09 DIAGNOSIS — F1410 Cocaine abuse, uncomplicated: Secondary | ICD-10-CM

## 2010-04-09 DIAGNOSIS — R45851 Suicidal ideations: Secondary | ICD-10-CM

## 2010-04-09 DIAGNOSIS — F1910 Other psychoactive substance abuse, uncomplicated: Secondary | ICD-10-CM

## 2010-04-09 DIAGNOSIS — F329 Major depressive disorder, single episode, unspecified: Secondary | ICD-10-CM

## 2010-04-09 DIAGNOSIS — R079 Chest pain, unspecified: Secondary | ICD-10-CM | POA: Insufficient documentation

## 2010-04-09 DIAGNOSIS — F3289 Other specified depressive episodes: Secondary | ICD-10-CM

## 2010-04-09 DIAGNOSIS — F1020 Alcohol dependence, uncomplicated: Principal | ICD-10-CM

## 2010-04-09 LAB — DIFFERENTIAL
Basophils Absolute: 0.1 10*3/uL (ref 0.0–0.1)
Basophils Absolute: 0 10*3/uL (ref 0.0–0.1)
Basophils Relative: 1 % (ref 0–1)
Basophils Relative: 1 % (ref 0–1)
Eosinophils Absolute: 0.4 10*3/uL (ref 0.0–0.7)
Eosinophils Absolute: 0.1 10*3/uL (ref 0.0–0.7)
Eosinophils Relative: 4 % (ref 0–5)
Eosinophils Relative: 2 % (ref 0–5)
Lymphocytes Relative: 18 % (ref 12–46)
Lymphocytes Relative: 37 % (ref 12–46)
Lymphs Abs: 1.9 10*3/uL (ref 0.7–4.0)
Lymphs Abs: 2.7 10*3/uL (ref 0.7–4.0)
Monocytes Absolute: 0.9 10*3/uL (ref 0.1–1.0)
Monocytes Absolute: 0.5 10*3/uL (ref 0.1–1.0)
Monocytes Relative: 8 % (ref 3–12)
Monocytes Relative: 7 % (ref 3–12)
Neutro Abs: 7 10*3/uL (ref 1.7–7.7)
Neutro Abs: 3.9 10*3/uL (ref 1.7–7.7)
Neutrophils Relative %: 69 % (ref 43–77)
Neutrophils Relative %: 54 % (ref 43–77)

## 2010-04-09 LAB — RAPID URINE DRUG SCREEN, HOSP PERFORMED
Amphetamines: NOT DETECTED
Amphetamines: NOT DETECTED
Barbiturates: NOT DETECTED
Barbiturates: NOT DETECTED
Benzodiazepines: NOT DETECTED
Benzodiazepines: NOT DETECTED
Cocaine: POSITIVE — AB
Cocaine: POSITIVE — AB
Opiates: NOT DETECTED
Opiates: NOT DETECTED
Tetrahydrocannabinol: POSITIVE — AB
Tetrahydrocannabinol: POSITIVE — AB

## 2010-04-09 LAB — BASIC METABOLIC PANEL
BUN: 11 mg/dL (ref 6–23)
BUN: 7 mg/dL (ref 6–23)
CO2: 26 mEq/L (ref 19–32)
CO2: 29 mEq/L (ref 19–32)
Calcium: 9.1 mg/dL (ref 8.4–10.5)
Calcium: 9.5 mg/dL (ref 8.4–10.5)
Chloride: 105 mEq/L (ref 96–112)
Chloride: 100 mEq/L (ref 96–112)
Creatinine, Ser: 0.92 mg/dL (ref 0.4–1.5)
Creatinine, Ser: 0.88 mg/dL (ref 0.4–1.5)
GFR calc Af Amer: >60 mL/min (ref >60)
GFR calc Af Amer: >60 mL/min (ref >60)
GFR calc non Af Amer: >60 mL/min (ref >60)
GFR calc non Af Amer: >60 mL/min (ref >60)
Glucose, Bld: 92 mg/dL (ref 70–99)
Glucose, Bld: 96 mg/dL (ref 70–99)
Potassium: 4.2 mEq/L (ref 3.5–5.1)
Potassium: 3.4 mEq/L — ABNORMAL LOW (ref 3.5–5.1)
Sodium: 138 mEq/L (ref 135–145)
Sodium: 137 mEq/L (ref 135–145)

## 2010-04-09 LAB — URINALYSIS, ROUTINE W REFLEX MICROSCOPIC
Bilirubin Urine: NEGATIVE
Glucose, UA: NEGATIVE mg/dL
Hgb urine dipstick: NEGATIVE
Ketones, ur: NEGATIVE mg/dL
Nitrite: NEGATIVE
Protein, ur: NEGATIVE mg/dL
Specific Gravity, Urine: 1.021 (ref 1.005–1.030)
Urobilinogen, UA: 1 mg/dL (ref 0.0–1.0)
pH: 6 (ref 5.0–8.0)

## 2010-04-09 LAB — CBC
HCT: 43.2 % (ref 39.0–52.0)
HCT: 44.8 % (ref 39.0–52.0)
Hemoglobin: 14.7 g/dL (ref 13.0–17.0)
Hemoglobin: 15.1 g/dL (ref 13.0–17.0)
MCH: 30.1 pg (ref 26.0–34.0)
MCHC: 34 g/dL (ref 30.0–36.0)
MCHC: 33.6 g/dL (ref 30.0–36.0)
MCV: 88.5 fL (ref 78.0–100.0)
MCV: 91.3 fL (ref 78.0–100.0)
Platelets: 290 10*3/uL (ref 150–400)
Platelets: 261 10*3/uL (ref 150–400)
RBC: 4.88 MIL/uL (ref 4.22–5.81)
RBC: 4.9 MIL/uL (ref 4.22–5.81)
RDW: 13.8 % (ref 11.5–15.5)
RDW: 14.7 % (ref 11.5–15.5)
WBC: 10.2 10*3/uL (ref 4.0–10.5)
WBC: 7.3 10*3/uL (ref 4.0–10.5)

## 2010-04-09 LAB — POCT CARDIAC MARKERS
CKMB, poc: 2.4 ng/mL (ref 1.0–8.0)
CKMB, poc: 2.2 ng/mL (ref 1.0–8.0)
Myoglobin, poc: 60.4 ng/mL (ref 12–200)
Myoglobin, poc: 34.8 ng/mL (ref 12–200)
Troponin i, poc: <0.05 ng/mL (ref 0.00–0.09)
Troponin i, poc: <0.05 ng/mL (ref 0.00–0.09)

## 2010-04-09 LAB — ETHANOL
Alcohol, Ethyl (B): 29 mg/dL — ABNORMAL HIGH (ref 0–10)
Alcohol, Ethyl (B): <5 mg/dL (ref 0–10)

## 2010-04-10 DIAGNOSIS — F1994 Other psychoactive substance use, unspecified with psychoactive substance-induced mood disorder: Secondary | ICD-10-CM

## 2010-04-10 DIAGNOSIS — F1920 Other psychoactive substance dependence, uncomplicated: Secondary | ICD-10-CM

## 2010-04-12 LAB — POCT I-STAT, CHEM 8
BUN: 6 mg/dL (ref 6–23)
Calcium, Ion: 1.18 mmol/L (ref 1.12–1.32)
Chloride: 104 mEq/L (ref 96–112)
Creatinine, Ser: 1 mg/dL (ref 0.4–1.5)
Glucose, Bld: 124 mg/dL — ABNORMAL HIGH (ref 70–99)
HCT: 50 % (ref 39.0–52.0)
Hemoglobin: 17 g/dL (ref 13.0–17.0)
Potassium: 3.9 mEq/L (ref 3.5–5.1)
Sodium: 141 mEq/L (ref 135–145)
TCO2: 26 mmol/L (ref 0–100)

## 2010-04-12 LAB — CBC
HCT: 46.8 % (ref 39.0–52.0)
Hemoglobin: 15.6 g/dL (ref 13.0–17.0)
MCHC: 33.3 g/dL (ref 30.0–36.0)
MCV: 90.8 fL (ref 78.0–100.0)
Platelets: 275 10*3/uL (ref 150–400)
RBC: 5.16 MIL/uL (ref 4.22–5.81)
RDW: 13.8 % (ref 11.5–15.5)
WBC: 5.9 10*3/uL (ref 4.0–10.5)

## 2010-04-12 LAB — DIFFERENTIAL
Basophils Absolute: 0 10*3/uL (ref 0.0–0.1)
Basophils Relative: 0 % (ref 0–1)
Eosinophils Absolute: 0.1 10*3/uL (ref 0.0–0.7)
Eosinophils Relative: 1 % (ref 0–5)
Lymphocytes Relative: 23 % (ref 12–46)
Lymphs Abs: 1.3 10*3/uL (ref 0.7–4.0)
Monocytes Absolute: 0.4 10*3/uL (ref 0.1–1.0)
Monocytes Relative: 7 % (ref 3–12)
Neutro Abs: 4 10*3/uL (ref 1.7–7.7)
Neutrophils Relative %: 69 % (ref 43–77)

## 2010-04-12 LAB — URINE MICROSCOPIC-ADD ON

## 2010-04-12 LAB — RAPID URINE DRUG SCREEN, HOSP PERFORMED
Amphetamines: NOT DETECTED
Barbiturates: NOT DETECTED
Benzodiazepines: NOT DETECTED
Cocaine: POSITIVE — AB
Opiates: NOT DETECTED
Tetrahydrocannabinol: NOT DETECTED

## 2010-04-12 LAB — URINALYSIS, ROUTINE W REFLEX MICROSCOPIC
Glucose, UA: NEGATIVE mg/dL
Ketones, ur: NEGATIVE mg/dL
Leukocytes, UA: NEGATIVE
Nitrite: NEGATIVE
Protein, ur: NEGATIVE mg/dL
Specific Gravity, Urine: 1.026 (ref 1.005–1.030)
Urobilinogen, UA: 1 mg/dL (ref 0.0–1.0)
pH: 5.5 (ref 5.0–8.0)

## 2010-04-12 LAB — ETHANOL: Alcohol, Ethyl (B): <5 mg/dL (ref 0–10)

## 2010-04-24 NOTE — H&P (Signed)
NAMEMARKELL, [name] NO.:  [id number]  MEDICAL RECORD NO.:  [id number]           PATIENT TYPE:  I  LOCATION:  0304                          FACILITY:  BH  PHYSICIAN:  Franklin Jungling, MD  DATE OF BIRTH:  June 20, 1970  DATE OF ADMISSION:  04/09/2010 DATE OF DISCHARGE:                      PSYCHIATRIC ADMISSION ASSESSMENT   The patient is a 40 year old African American male who presented to the [facility] emergency room requesting detox.  The patient states that he had been drinking a case of beer a day for the last 2-3 weeks as well as using a gram of crack cocaine for the last 2-3 weeks.  The patient was recently admitted to [facility] and discharged in February of this year.  The patient states he relapsed.  He has had multiple admissions in the past, this is one of many currently.  He lives with his mother.  He is single.  He has had 2 years of college, is unemployed, and he has a court date this month currently pending for larceny.  ALCOHOL HISTORY:  Again, 1 case of beer a day for the last 2-3 weeks and at least 1 gram of crack every day for the last 2-3 weeks.  Currently he has no primary care provider and denies any ongoing health problems.  MEDICATIONS ARE:  Lexapro and Geodon.  He has no known drug allergies.  He was evaluated in the emergency room and review of systems was negative.  Listed medications included trazodone, Geodon and Lexapro. Examination was unremarkable done by Dr. Emeline [name].  EKG and cardiac markers are well within normal limits.  Chest x-ray:  No evidence of acute pulmonary disease.  CBC with differential unremarkable.  Alcohol:  Blood alcohol level was 29.  Basic metabolic profile was normal.  The patient was given an urine drug screen, which was positive for cocaine and cannabis.  CURRENT PSYCHIATRIC EVALUATION:  He is alert and oriented x3.  He is covered up in bed, stating that he does not feel well and  is suffering from alcohol withdrawal.  Says he has occasional suicidal ideation on and off.  He has last attempted suicide a year ago by hanging.  He is a well-developed well-nourished 40 year old African American male covered up in bed.  Speech is clear.  Fair eye contact.  Mood is depressed. Affect is congruent.  Thought process is linear without auditory or visual hallucinations.  Cognition is average.  ASSESSMENT:  Axis I:  Polysubstance abuse with alcohol dependency. Axis II:  None. Axis III:  None. Axis IV:  Legal issue. Axis V:  Current GAF 45, past year unknown.  PLAN:  The patient will be detoxed via the Librium protocol.  He will be restarted on previous psychiatric medications including Geodon and Lexapro.  Careful watchful waiting.  Expected length of stay 2 to 3 days.    ______________________________ Franklin Spurr, PA   ______________________________ Franklin Jungling, MD    [name]  D:  04/10/2010  T:  04/10/2010  Job:  [id number]  Electronically Signed by Franklin [name]  on 04/20/2010 04:02:41 PM Electronically Signed by Franklin Flash MD  on 04/24/2010 11:37:48 AM

## 2010-06-13 NOTE — Discharge Summary (Signed)
  Franklin, [name]               ACCOUNT NO.:  [id number]  MEDICAL RECORD NO.:  [id number]           PATIENT TYPE:  I  LOCATION:  0304                          FACILITY:  BH  PHYSICIAN:  Anselm Jungling, MD  DATE OF BIRTH:  Jan 16, 1970  DATE OF ADMISSION:  04/09/2010 DATE OF DISCHARGE:  04/13/2010                              DISCHARGE SUMMARY   IDENTIFYING INFORMATION:  This is a 40 year old African American male. This is a voluntary admission.  HISTORY OF PRESENT ILLNESS:  Franklin [name] presented by way of our emergency room requesting detox from alcohol, drinking about a case of beer most days of the week for the last 2-3 weeks as well as using a gram of cocaine for the last 2-3 weeks.  He had been at [facility] previously in February of this year and had been sober for an unclear period of time and subsequently relapsed.  He is not currently involved in any outpatient treatment or in AA support groups.  MEDICAL EVALUATION AND DIAGNOSTIC STUDIES:  He was medically cleared in our emergency room. An  EKG and cardiac markers were within normal limits.  Chest x-ray negative.  CBC unremarkable.  Chemistries normal with normal renal function.  Blood alcohol level 29 mg/dL. A urine drug screen positive for cocaine and cannabis.  COURSE OF HOSPITALIZATION:  He was admitted to our detox unit and gradually assimilated into the milieu.  He was started on a Librium protocol with a goal of a safe detox from alcohol and substances within 4 days.  He was started back on his Lexapro 10 mg daily for depression and Geodon 40 mg b.i.d. for augmentation of his antidepressant.  Group participation was good.  His interaction with peers and staff was appropriate while here.  He identified the [facility] as a resource that helped him to maintain sobriety and expressed his desire to return there.  His detox was marked by withdrawal symptoms of nausea which gradually subsided without complication.  He  gave [place] permission to speak with his family who accepted suicide prevention information.  By the 9th he had no dangerous thoughts,  no further withdrawal symptoms and felt ready to return with his mother home with a follow-up at the [facility].  PLAN:  Discharge plan was to follow up at the [facility] on April 9 at 2:11 a.m.  DISCHARGE MEDICATIONS:  Geodon 40 mg b.i.d. Lexapro 20 mg daily, trazodone 100 mg bedtime  p.r.n. insomnia.  DISCHARGE DIAGNOSIS:  AXIS I:  Polysubstance abuse and dependence. Depressive disorder NOS, rule out substance-induced mood disorder. AXIS II: No diagnosis. AXIS III: No diagnosis. AXIS IV: Deferred. AXIS V: Current 58. Past year not known.  DISCHARGE CONDITION:  Stable.     Margaret A. Lorin Picket, N.P.   ______________________________ Anselm Jungling, MD    MAS/MEDQ  D:  06/05/2010  T:  06/05/2010  Job:  [id number]  Electronically Signed by Kari Baars N.P. on 06/05/2010 12:06:14 PM Electronically Signed by Eulogio Ditch  on 06/13/2010 05:53:40 PM

## 2011-01-05 IMAGING — CR DG CHEST 2V
2 series · 2 of 2 positions shown · non-contrast
Comparison: None

CLINICAL DATA: Chest pain, shortness of breath, smoker

CHEST - 2 VIEW

[w chest pa]
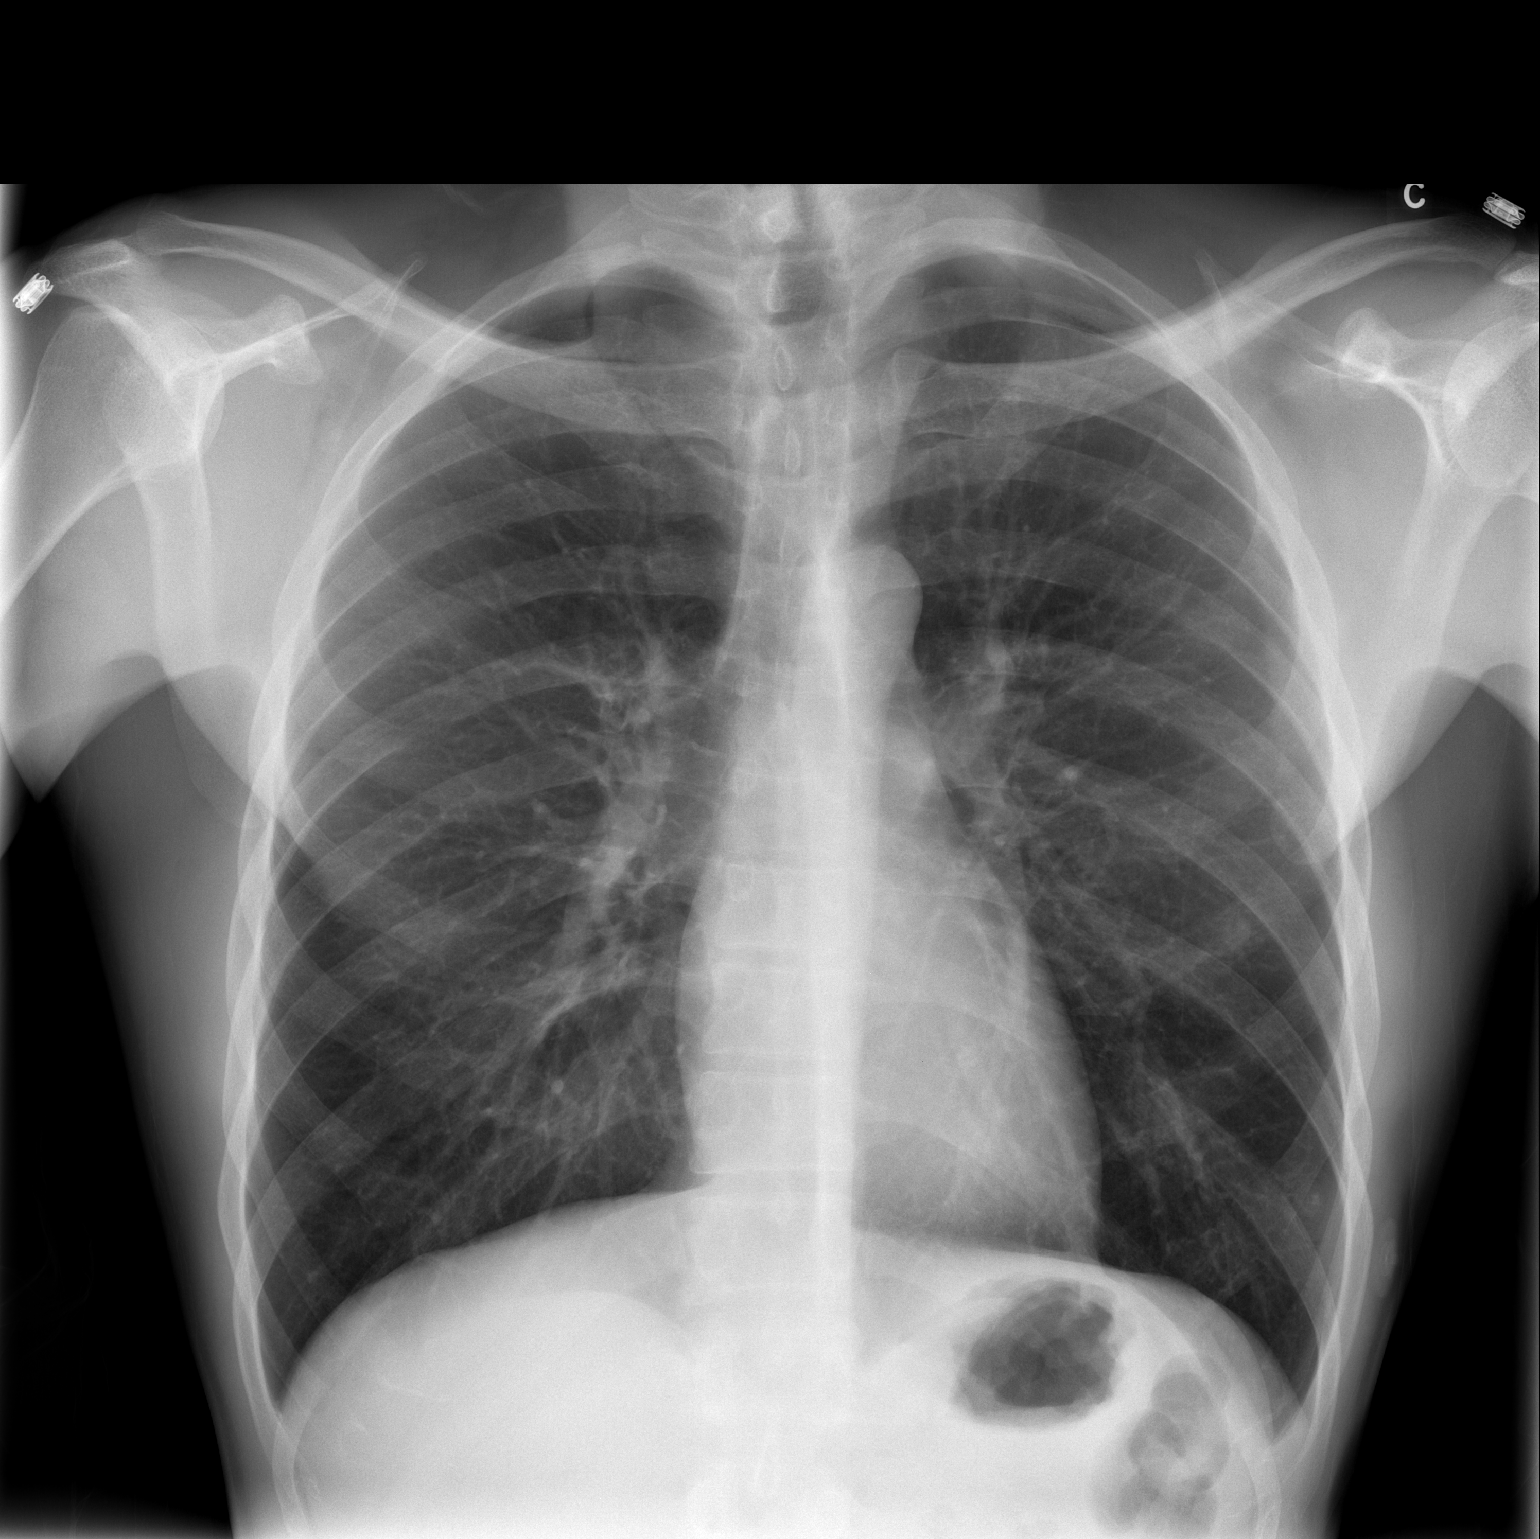

[w chest lat]
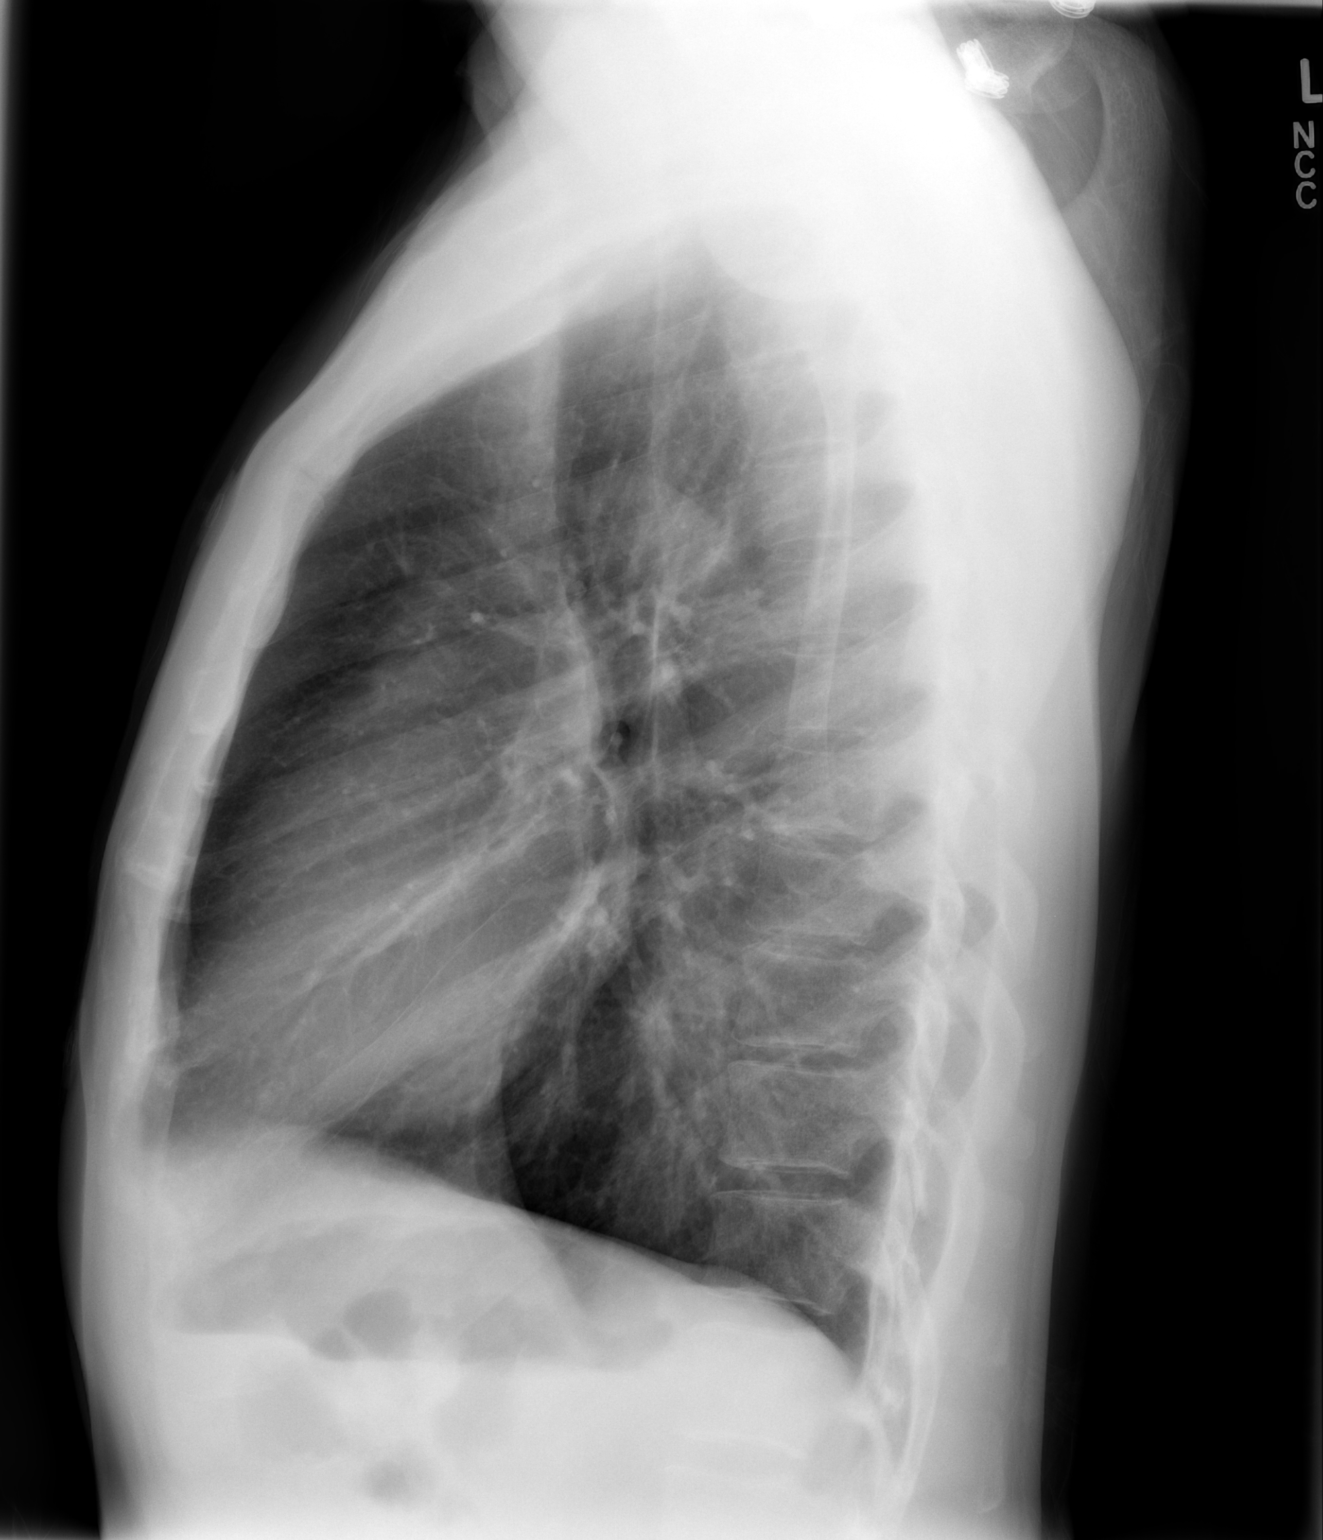

[2 of 2 positions shown; findings below may reference images not displayed]

FINDINGS: Normal heart size, mediastinal contours, and pulmonary vascularity.
Hyperexpanded lungs with minimal peribronchial thickening, question
asthma.
Potential left nipple shadow.
Lungs otherwise clear.
Bones unremarkable.
IMPRESSION: Peribronchial thickening and hyperexpanded lungs question asthma.
Question left nipple shadow, repeat PA chest radiograph nipple
markers recommended to exclude pulmonary nodule.

## 2011-03-23 ENCOUNTER — Emergency Department (HOSPITAL_COMMUNITY)
Admission: EM | Admit: 2011-03-23 | Discharge: 2011-03-23 | Disposition: A | Discharge status: Other Acute Inpatient Hospital | Payer: Self-pay | Attending: Emergency Medicine | Admitting: Emergency Medicine

## 2011-03-23 ENCOUNTER — Encounter (HOSPITAL_COMMUNITY): Payer: Self-pay | Admitting: Emergency Medicine

## 2011-03-23 ENCOUNTER — Encounter (HOSPITAL_COMMUNITY): Payer: Self-pay | Admitting: *Deleted

## 2011-03-23 ENCOUNTER — Inpatient Hospital Stay (HOSPITAL_COMMUNITY)
Admission: AD | Admit: 2011-03-23 | Discharge: 2011-03-27 | DRG: 897 | Disposition: A | Discharge status: Home / Self Care | Payer: PRIVATE HEALTH INSURANCE | Source: Ambulatory Visit | Attending: Psychiatry | Admitting: Psychiatry

## 2011-03-23 DIAGNOSIS — T510X4A Toxic effect of ethanol, undetermined, initial encounter: Secondary | ICD-10-CM

## 2011-03-23 DIAGNOSIS — F1320 Sedative, hypnotic or anxiolytic dependence, uncomplicated: Secondary | ICD-10-CM

## 2011-03-23 DIAGNOSIS — F1010 Alcohol abuse, uncomplicated: Secondary | ICD-10-CM

## 2011-03-23 DIAGNOSIS — R45851 Suicidal ideations: Secondary | ICD-10-CM | POA: Insufficient documentation

## 2011-03-23 DIAGNOSIS — T405X1A Poisoning by cocaine, accidental (unintentional), initial encounter: Secondary | ICD-10-CM

## 2011-03-23 DIAGNOSIS — T6592XA Toxic effect of unspecified substance, intentional self-harm, initial encounter: Secondary | ICD-10-CM

## 2011-03-23 DIAGNOSIS — F319 Bipolar disorder, unspecified: Secondary | ICD-10-CM | POA: Insufficient documentation

## 2011-03-23 DIAGNOSIS — F1020 Alcohol dependence, uncomplicated: Principal | ICD-10-CM

## 2011-03-23 DIAGNOSIS — F411 Generalized anxiety disorder: Secondary | ICD-10-CM

## 2011-03-23 DIAGNOSIS — F19921 Other psychoactive substance use, unspecified with intoxication with delirium: Secondary | ICD-10-CM

## 2011-03-23 DIAGNOSIS — T50992A Poisoning by other drugs, medicaments and biological substances, intentional self-harm, initial encounter: Secondary | ICD-10-CM

## 2011-03-23 DIAGNOSIS — F1910 Other psychoactive substance abuse, uncomplicated: Secondary | ICD-10-CM

## 2011-03-23 DIAGNOSIS — Z79899 Other long term (current) drug therapy: Secondary | ICD-10-CM

## 2011-03-23 DIAGNOSIS — R454 Irritability and anger: Secondary | ICD-10-CM

## 2011-03-23 DIAGNOSIS — F39 Unspecified mood [affective] disorder: Secondary | ICD-10-CM

## 2011-03-23 HISTORY — DX: Depression, unspecified: F32.A

## 2011-03-23 HISTORY — DX: Major depressive disorder, single episode, unspecified: F32.9

## 2011-03-23 HISTORY — DX: Other specified health status: Z78.9

## 2011-03-23 HISTORY — DX: Anxiety disorder, unspecified: F41.9

## 2011-03-23 HISTORY — DX: Bipolar disorder, unspecified: F31.9

## 2011-03-23 LAB — URINALYSIS, ROUTINE W REFLEX MICROSCOPIC
Bilirubin Urine: NEGATIVE
Glucose, UA: NEGATIVE mg/dL
Hgb urine dipstick: NEGATIVE
Ketones, ur: NEGATIVE mg/dL
Leukocytes, UA: NEGATIVE
Nitrite: NEGATIVE
Protein, ur: NEGATIVE mg/dL
Specific Gravity, Urine: 1.018 (ref 1.005–1.030)
Urobilinogen, UA: 0.2 mg/dL (ref 0.0–1.0)
pH: 5.5 (ref 5.0–8.0)

## 2011-03-23 LAB — DIFFERENTIAL
Basophils Absolute: 0.1 10*3/uL (ref 0.0–0.1)
Basophils Relative: 1 % (ref 0–1)
Eosinophils Absolute: 0.3 10*3/uL (ref 0.0–0.7)
Eosinophils Relative: 3 % (ref 0–5)
Lymphocytes Relative: 22 % (ref 12–46)
Lymphs Abs: 1.9 10*3/uL (ref 0.7–4.0)
Monocytes Absolute: 0.7 10*3/uL (ref 0.1–1.0)
Monocytes Relative: 8 % (ref 3–12)
Neutro Abs: 5.9 10*3/uL (ref 1.7–7.7)
Neutrophils Relative %: 66 % (ref 43–77)

## 2011-03-23 LAB — COMPREHENSIVE METABOLIC PANEL
ALT: 18 U/L (ref 0–53)
AST: 23 U/L (ref 0–37)
Albumin: 4.4 g/dL (ref 3.5–5.2)
Alkaline Phosphatase: 70 U/L (ref 39–117)
BUN: 7 mg/dL (ref 6–23)
CO2: 29 mEq/L (ref 19–32)
Calcium: 9.9 mg/dL (ref 8.4–10.5)
Chloride: 97 mEq/L (ref 96–112)
Creatinine, Ser: 0.91 mg/dL (ref 0.50–1.35)
GFR calc Af Amer: >90 mL/min (ref >90)
GFR calc non Af Amer: >90 mL/min (ref >90)
Glucose, Bld: 94 mg/dL (ref 70–99)
Potassium: 3.7 mEq/L (ref 3.5–5.1)
Sodium: 137 mEq/L (ref 135–145)
Total Bilirubin: 0.8 mg/dL (ref 0.3–1.2)
Total Protein: 8.5 g/dL — ABNORMAL HIGH (ref 6.0–8.3)

## 2011-03-23 LAB — CBC
HCT: 42.6 % (ref 39.0–52.0)
Hemoglobin: 14.4 g/dL (ref 13.0–17.0)
MCH: 28.7 pg (ref 26.0–34.0)
MCHC: 33.8 g/dL (ref 30.0–36.0)
MCV: 85 fL (ref 78.0–100.0)
Platelets: 320 10*3/uL (ref 150–400)
RBC: 5.01 MIL/uL (ref 4.22–5.81)
RDW: 13.5 % (ref 11.5–15.5)
WBC: 8.9 10*3/uL (ref 4.0–10.5)

## 2011-03-23 LAB — RAPID URINE DRUG SCREEN, HOSP PERFORMED
Amphetamines: NOT DETECTED
Barbiturates: NOT DETECTED
Benzodiazepines: NOT DETECTED
Cocaine: POSITIVE — AB
Opiates: NOT DETECTED
Tetrahydrocannabinol: NOT DETECTED

## 2011-03-23 LAB — ETHANOL: Alcohol, Ethyl (B): <11 mg/dL (ref 0–11)

## 2011-03-23 MED ORDER — HYDROXYZINE HCL 25 MG PO TABS
25.0000 mg | ORAL_TABLET | Freq: Four times a day (QID) | ORAL | Status: DC | PRN
  Administered 2011-03-23: 25 mg via ORAL

## 2011-03-23 MED ORDER — ZOLPIDEM TARTRATE 5 MG PO TABS: 5.0000 mg | ORAL_TABLET | Freq: Every evening | ORAL | Status: DC | PRN

## 2011-03-23 MED ORDER — CHLORDIAZEPOXIDE HCL 25 MG PO CAPS: 25.0000 mg | ORAL_CAPSULE | Freq: Every day | ORAL | Status: DC

## 2011-03-23 MED ORDER — IBUPROFEN 600 MG PO TABS: 600.0000 mg | ORAL_TABLET | Freq: Three times a day (TID) | ORAL | Status: DC | PRN

## 2011-03-23 MED ORDER — CHLORDIAZEPOXIDE HCL 25 MG PO CAPS
25.0000 mg | ORAL_CAPSULE | ORAL | Status: AC
  Administered 2011-03-26 – 2011-03-27 (×2): 25 mg via ORAL
  Filled 2011-03-23 (×2): qty 1

## 2011-03-23 MED ORDER — CHLORDIAZEPOXIDE HCL 25 MG PO CAPS: 25.0000 mg | ORAL_CAPSULE | Freq: Four times a day (QID) | ORAL | Status: DC | PRN

## 2011-03-23 MED ORDER — CHLORDIAZEPOXIDE HCL 25 MG PO CAPS
25.0000 mg | ORAL_CAPSULE | Freq: Four times a day (QID) | ORAL | Status: AC
  Administered 2011-03-24 – 2011-03-25 (×4): 25 mg via ORAL
  Filled 2011-03-23 (×5): qty 1

## 2011-03-23 MED ORDER — ACETAMINOPHEN 325 MG PO TABS: 650.0000 mg | ORAL_TABLET | Freq: Four times a day (QID) | ORAL | Status: DC | PRN

## 2011-03-23 MED ORDER — VITAMIN B-1 100 MG PO TABS
100.0000 mg | ORAL_TABLET | Freq: Every day | ORAL | Status: DC
  Administered 2011-03-24 – 2011-03-27 (×4): 100 mg via ORAL
  Filled 2011-03-23 (×6): qty 1

## 2011-03-23 MED ORDER — LOPERAMIDE HCL 2 MG PO CAPS: 2.0000 mg | ORAL_CAPSULE | ORAL | Status: AC | PRN

## 2011-03-23 MED ORDER — THIAMINE HCL 100 MG/ML IJ SOLN: 100.0000 mg | Freq: Once | INTRAMUSCULAR | Status: DC

## 2011-03-23 MED ORDER — ONDANSETRON HCL 4 MG PO TABS: 4.0000 mg | ORAL_TABLET | Freq: Three times a day (TID) | ORAL | Status: DC | PRN

## 2011-03-23 MED ORDER — ACETAMINOPHEN 325 MG PO TABS: 650.0000 mg | ORAL_TABLET | ORAL | Status: DC | PRN

## 2011-03-23 MED ORDER — CHLORDIAZEPOXIDE HCL 25 MG PO CAPS
50.0000 mg | ORAL_CAPSULE | Freq: Once | ORAL | Status: AC
  Administered 2011-03-23: 50 mg via ORAL
  Filled 2011-03-23: qty 2

## 2011-03-23 MED ORDER — NICOTINE 21 MG/24HR TD PT24: 21.0000 mg | MEDICATED_PATCH | Freq: Every day | TRANSDERMAL | Status: DC | PRN

## 2011-03-23 MED ORDER — ONDANSETRON 4 MG PO TBDP
4.0000 mg | ORAL_TABLET | Freq: Four times a day (QID) | ORAL | Status: AC | PRN
Start: 2011-03-23 — End: 2011-03-26
  Administered 2011-03-24: 4 mg via ORAL

## 2011-03-23 MED ORDER — ALUM & MAG HYDROXIDE-SIMETH 200-200-20 MG/5ML PO SUSP: 30.0000 mL | ORAL | Status: DC | PRN

## 2011-03-23 MED ORDER — CHLORDIAZEPOXIDE HCL 25 MG PO CAPS
25.0000 mg | ORAL_CAPSULE | Freq: Three times a day (TID) | ORAL | Status: AC
  Administered 2011-03-25 – 2011-03-26 (×3): 25 mg via ORAL
  Filled 2011-03-23 (×3): qty 1

## 2011-03-23 MED ORDER — MAGNESIUM HYDROXIDE 400 MG/5ML PO SUSP: 30.0000 mL | Freq: Every day | ORAL | Status: DC | PRN

## 2011-03-23 MED ORDER — ADULT MULTIVITAMIN W/MINERALS CH
1.0000 | ORAL_TABLET | Freq: Every day | ORAL | Status: DC
  Administered 2011-03-23 – 2011-03-27 (×5): 1 via ORAL
  Filled 2011-03-23 (×7): qty 1

## 2011-03-23 NOTE — [facility] Counselor (Signed)
Dr. Wonda Olds requested a telepsych. The telepsych was completed and treatment recommendations state: Admit to Psych Unit.

## 2011-03-23 NOTE — Tx Team (Signed)
Initial Interdisciplinary Treatment Plan  PATIENT STRENGTHS: (choose at least two) Average or above average intelligence Communication skills Physical Health Supportive family/friends  PATIENT STRESSORS: Medication change or noncompliance Substance abuse   PROBLEM LIST: Problem List/Patient Goals Date to be addressed Date deferred Reason deferred Estimated date of resolution  Substance Abuse 03/24/11     Medication non compliance 03/24/11     Suicidal Thoughts 03/24/11                                          DISCHARGE CRITERIA:  Ability to meet basic life and health needs Adequate post-discharge living arrangements Improved stabilization in mood, thinking, and/or behavior Motivation to continue treatment in a less acute level of care Verbal commitment to aftercare and medication compliance Withdrawal symptoms are absent or subacute and managed without 24-hour nursing intervention  PRELIMINARY DISCHARGE PLAN: Attend aftercare/continuing care group Attend PHP/IOP Attend 12-step recovery group Outpatient therapy Participate in family therapy Placement in alternative living arrangements  PATIENT/FAMIILY INVOLVEMENT: This treatment plan has been presented to and reviewed with the patient, Franklin [name], and/or family member.  The patient and family have been given the opportunity to ask questions and make suggestions.  Franklin [name] [facility] 03/23/2011, 7:39 PM

## 2011-03-23 NOTE — ED Provider Notes (Addendum)
History     CSN: [id number]  Arrival date & time 03/23/11  0430   First MD Initiated Contact with Patient 03/23/11 [id number]      Chief Complaint  Patient presents with  . Medical Clearance    (Consider location/radiation/quality/duration/timing/severity/associated sxs/prior treatment) HPI Comments: Doc Mandala is a 41 y.o. male who presents with suicidal ideation. He was attempted to take some pills and sisters when she removed them from him so he did not take them. He thinks his depression is what is causing him to feel suicidal. He is off his Geodon and Seroquel for several months by his choice. Instead, using cocaine. He complains of anxiousness. He lives with his mother and apparently they're getting along well. His psychiatric care is through the [facility]. He has no medical illnesses including fever, chills, nausea, vomiting, cough, chest pain, back pain, weakness, dizziness.  The history is provided by the patient.    Past Medical History  Diagnosis Date  . Depression   . Anxiety   . Bipolar 1 disorder   . No pertinent past medical history     Past Surgical History  Procedure Date  . No past surgeries     Family History  Problem Relation Age of Onset  . Hypertension Other   . Diabetes Other     History  Substance Use Topics  . Smoking status: Current Everyday Smoker -- 0.5 packs/day    Types: Cigarettes  . Smokeless tobacco: Not on file  . Alcohol Use: 7.2 oz/week    12 Cans of beer per week     heavy 12 pk/day      Review of Systems  All other systems reviewed and are negative.    Allergies  Review of patient's allergies indicates no known allergies.  Home Medications  No current outpatient prescriptions on file.  BP 133/66  Pulse 91  Temp(Src) 99.1 F (37.3 C) (Oral)  Resp 16  SpO2 96%  Physical Exam  Nursing note and vitals reviewed. Constitutional: He is oriented to person, place, and time. He appears well-developed and  well-nourished.  HENT:  Head: Normocephalic and atraumatic.  Right Ear: External ear normal.  Left Ear: External ear normal.  Eyes: Conjunctivae and EOM are normal. Pupils are equal, round, and reactive to light.  Neck: Normal range of motion and phonation normal. Neck supple.  Cardiovascular: Normal rate, regular rhythm, normal heart sounds and intact distal pulses.   Pulmonary/Chest: Effort normal and breath sounds normal. He exhibits no bony tenderness.  Abdominal: Soft. Normal appearance. There is no tenderness.  Musculoskeletal: Normal range of motion.  Neurological: He is alert and oriented to person, place, and time. He has normal strength. No cranial nerve deficit or sensory deficit. He exhibits normal muscle tone. Coordination normal.  Skin: Skin is warm, dry and intact.  Psychiatric: He has a normal mood and affect. His behavior is normal. Judgment and thought content normal.    ED Course  Procedures (including critical care time)  We'll initiate a psychiatric consult- 07:30  Labs Reviewed  COMPREHENSIVE METABOLIC PANEL - Abnormal; Notable for the following:    Total Protein 8.5 (*)    All other components within normal limits  URINE RAPID DRUG SCREEN (HOSP PERFORMED) - Abnormal; Notable for the following:    Cocaine POSITIVE (*)    All other components within normal limits  CBC  DIFFERENTIAL  URINALYSIS, ROUTINE W REFLEX MICROSCOPIC  ETHANOL  LAB REPORT - SCANNED   No results found.  1. Suicidal ideations       MDM  Suicidal ideation; cough medicines for depression and psychosis. Medically cleared for psychiatric treatment.        Flint Melter, MD 03/24/11 1914  Flint Melter, MD 03/24/11 [phone]

## 2011-03-23 NOTE — ED Notes (Signed)
Pt states he has been depressed and not taking his meds x several months.  Says he has SI on and off with plan to OD.  Denies HI.

## 2011-03-23 NOTE — ED Notes (Signed)
Pt states he has been having audible hallucinations and has been under a lot of stress lately  Pt states tonight he was going to take a bunch of pills but his sister took them away from him  Pt states he is feeling suicidal  Pt states he has been off all of his meds for the past 3 to 4 months   Pt has hx of depression and bipolar

## 2011-03-23 NOTE — [facility] Counselor (Signed)
Patient accepted to [facility].  **Dr. Wallis Mart to Dr. Audry Pili. Room 302-2 at 6pm.

## 2011-03-23 NOTE — Tx Team (Signed)
Initial Interdisciplinary Treatment Plan  PATIENT STRENGTHS: (choose at least two) Average or above average intelligence Communication skills Motivation for treatment/growth Physical Health Supportive family/friends  PATIENT STRESSORS: Medication change or noncompliance Substance abuse   PROBLEM LIST: Problem List/Patient Goals Date to be addressed Date deferred Reason deferred Estimated date of resolution  Substance Abuse 03/24/11     Suicidal thoughts 03/24/11     Medication non compliant 03/24/11                                          DISCHARGE CRITERIA:  Ability to meet basic life and health needs Adequate post-discharge living arrangements Improved stabilization in mood, thinking, and/or behavior Medical problems require only outpatient monitoring Motivation to continue treatment in a less acute level of care Need for constant or close observation no longer present Verbal commitment to aftercare and medication compliance Withdrawal symptoms are absent or subacute and managed without 24-hour nursing intervention  PRELIMINARY DISCHARGE PLAN:   PATIENT/FAMIILY INVOLVEMENT: This treatment plan has been presented to and reviewed with the patient, Franklin [name], and/or family member.  The patient and family have been given the opportunity to ask questions and make suggestions.  Franklin [name] [facility] 03/23/2011, 7:32 PM

## 2011-03-23 NOTE — Progress Notes (Signed)
This is a 41 years old African American male admitted to the unit for Substance Abuse. Patient reported having suicide thoughts. Patient tearfully stated; "I have so many stressors"; but unable to discuss what his stressors were". He reported that he has been drinking and abusing cocaine since he was 41 years old. Stated  he drinks 12 cans of beer daily and 1 gram off Cocaine every other day. His mood and affect sad and depressed. He contracted for safety. Patient denied any medical problem, endorsed medication non- compliance for about 6 months. Blood pressure and pulse, slightly elevated on admission. He also has rash; (hives ) on left hand. He requested for Benadryl for that. Patient oriented to the unit and Q 15 minute check initiated.

## 2011-03-23 NOTE — [facility] Counselor (Signed)
Dr Effie Shy requesting a telepsych for patient. Writer initiated the telepsych. Pending completion the telepsych at this time.

## 2011-03-23 NOTE — BH Assessment (Signed)
Assessment Note   Franklin [name] is an 41 y.o. male who presents with suicidal ideations. He attempted to take some pills and sister stopped him before he was able to OD. He complains of increased depression and anxiety.  He thinks his depression is what is causing him to feel suicidal. Patients has no stressors and sts his suicidal gesture/attempt was triggered by nothing other than depression. He has a history of 1 prior attempt 1 yr ago and he tried to hang himself. Patient hospitalized 2x's at [facility] 1 yr ago and the yr prior. He has been off his Geodon and Seroquel for several months by his choice.  Instead, using cocaine. He reports cocaine use 2-3x's per week. Patient also drinking a 12 pack of beer daily. His psychiatric care is typically through the [facility]. Patient also reports auditory hallucinations. Sts he often hears voices telling him to harm himself. He says that the "voices told me to kill myself last night". Patient is unable to contract for safety and does not feel safe to discharge home. Patient referred to [facility] for in-pt psychiatric treatment.  Dr. Wonda Olds requested a telepsych. The telepsych was completed and treatment recommendations state: Admit to Psych Unit.   Axis I: Major Depression, Recurrent severe with psychotic feature; alcohol dependence; cocaine dependence Axis II: Deferred Axis III:  Past Medical History  Diagnosis Date  . Depression   . Anxiety   . Bipolar 1 disorder    Axis IV: other psychosocial or environmental problems, problems related to social environment and problems with access to health care services Axis V: 31-40 impairment in reality testing  Past Medical History:  Past Medical History  Diagnosis Date  . Depression   . Anxiety   . Bipolar 1 disorder     History reviewed. No pertinent past surgical history.  Family History:  Family History  Problem Relation Age of Onset  . Hypertension Other   . Diabetes Other     Social  History:  reports that he has been smoking Cigarettes.  He does not have any smokeless tobacco history on file. He reports that he drinks alcohol. He reports that he uses illicit drugs (Cocaine).  Additional Social History:  Alcohol / Drug Use Pain Medications: see home meds or ED notes  Prescriptions: see home meds or ED notes  Over the Counter: see home meds or ED notes  History of alcohol / drug use?: Yes Substance #1 Name of Substance 1: Alcohol 1 - Age of First Use: 41 yrs old 1 - Amount (size/oz): 12 pack per day 1 - Frequency: daily 1 - Duration: on-going 1 - Last Use / Amount: 03/22/2011; 12 pack of beer Substance #2 Name of Substance 2: Cocaine 2 - Age of First Use: 41 yrs old 2 - Amount (size/oz): 1 gram 2 - Frequency: 2-3x's per month 2 - Duration: on-going 2 - Last Use / Amount: "2 days ago" Allergies: No Known Allergies  Home Medications:  Medications Prior to Admission  Medication Dose Route Frequency Provider Last Rate Last Dose  . acetaminophen (TYLENOL) tablet 650 mg  650 mg Oral Q4H PRN Flint Melter, MD      . ibuprofen (ADVIL,MOTRIN) tablet 600 mg  600 mg Oral Q8H PRN Flint Melter, MD      . nicotine (NICODERM CQ - dosed in mg/24 hours) patch 21 mg  21 mg Transdermal Daily PRN Flint Melter, MD      . ondansetron [facility]) tablet 4 mg  4 mg Oral Q8H PRN Flint Melter, MD      . zolpidem [facility]) tablet 5 mg  5 mg Oral QHS PRN Flint Melter, MD       No current outpatient prescriptions on file as of 03/23/2011.    OB/GYN Status:  No LMP for male patient.  General Assessment Data Location of Assessment: [facility] ED ACT Assessment:  (Yes) Living Arrangements:  (lives with mother) Can pt return to current living arrangement?: Yes Admission Status: Voluntary Is patient capable of signing voluntary admission?: Yes Transfer from: Acute Hospital Referral Source: Self/Family/Friend  Education Status Is patient currently in school?: No  Risk to  self Suicidal Ideation: Yes-Currently Present Suicidal Intent: Yes-Currently Present Is patient at risk for suicide?: Yes Suicidal Plan?: Yes-Currently Present Specify Current Suicidal Plan:  (overdose is pt's plan; attempted but stopped by sister) Access to Means: Yes Specify Access to Suicidal Means:  (pills) What has been your use of drugs/alcohol within the last 12 months?:  (cocaine and alcohol use) Previous Attempts/Gestures: Yes How many times?:  (1x pt attempted to hang self and overdose) Other Self Harm Risks:  (none reported) Triggers for Past Attempts:  (no triggers notes) Intentional Self Injurious Behavior: None (none reported) Family Suicide History: Unknown Recent stressful life event(s):  (pt does not identify any stressors) Persecutory voices/beliefs?: No Depression: Yes Depression Symptoms: Despondent;Fatigue;Loss of interest in usual pleasures;Feeling worthless/self pity Substance abuse history and/or treatment for substance abuse?: Yes Suicide prevention information given to non-admitted patients: Not applicable  Risk to Others Homicidal Ideation: No Thoughts of Harm to Others: No Current Homicidal Intent: No Current Homicidal Plan: No Access to Homicidal Means: No Identified Victim:  (n/a) History of harm to others?: No Assessment of Violence: None Noted Violent Behavior Description:  (pt calm and cooperative at this time) Does patient have access to weapons?: No Criminal Charges Pending?: No Does patient have a court date: No  Psychosis Hallucinations: None noted Delusions: None noted  Mental Status Report Appear/Hygiene: Disheveled Eye Contact: Poor Motor Activity: Unremarkable;Freedom of movement Speech: Logical/coherent Level of Consciousness: Alert Mood: Depressed;Anxious;Helpless;Preoccupied;Sad Affect: Appropriate to circumstance Anxiety Level: Moderate Thought Processes: Relevant Judgement: Unimpaired Orientation:  Person;Place;Time;Situation Obsessive Compulsive Thoughts/Behaviors: None  Cognitive Functioning Concentration: Decreased Memory: Recent Intact;Remote Intact IQ: Average Insight: Poor Impulse Control: Poor Appetite: Good Weight Loss:  (0) Weight Gain:  (0) Sleep: No Change Total Hours of Sleep:  (6-7 hrs per night) Vegetative Symptoms: None  Prior Inpatient Therapy Prior Inpatient Therapy: Yes Prior Therapy Dates:  ([facility]-1 yr ago; [facility]2 yrs ago) Prior Therapy Facilty/Provider(s):  [facility]) Reason for Treatment:  (depression, suicidal, psychosis, substance abuse)  Prior Outpatient Therapy Prior Outpatient Therapy: Yes Prior Therapy Dates:  (current) Prior Therapy Facilty/Provider(s):  (psychiatrist and med management) Reason for Treatment:  (depression)  ADL Screening (condition at time of admission) Patient's cognitive ability adequate to safely complete daily activities?: Yes Patient able to express need for assistance with ADLs?: Yes Independently performs ADLs?: Yes Weakness of Legs: None Weakness of Arms/Hands: None  Home Assistive Devices/Equipment Home Assistive Devices/Equipment: None    Abuse/Neglect Assessment (Assessment to be complete while patient is alone) Physical Abuse: Denies Verbal Abuse: Denies Sexual Abuse: Denies Exploitation of patient/patient's resources: Denies Self-Neglect: Denies Values / Beliefs Cultural Requests During Hospitalization: None Spiritual Requests During Hospitalization: None   Advance Directives (For Healthcare) Advance Directive: Patient does not have advance directive Nutrition Screen Diet: Regular Unintentional weight loss greater than 10lbs within the last month: No Problems chewing  or swallowing foods and/or liquids: No Home Tube Feeding or Total Parenteral Nutrition (TPN): No Patient appears severely malnourished: No  Additional Information 1:1 In Past 12 Months?: No CIRT Risk: No Elopement Risk: No Does patient  have medical clearance?: Yes     Disposition:  Disposition Disposition of Patient: Inpatient treatment program Type of inpatient treatment program: Adult The telepsych was completed and treatment recommendations state: Admit to Psych Unit.  On Site Evaluation by:   Reviewed with Physician:     Melynda Ripple [facility] 03/23/2011 10:46 AM

## 2011-03-24 MED ORDER — MIRTAZAPINE 15 MG PO TABS
15.0000 mg | ORAL_TABLET | Freq: Every day | ORAL | Status: DC
  Administered 2011-03-25 – 2011-03-26 (×2): 15 mg via ORAL
  Filled 2011-03-24 (×2): qty 1
  Filled 2011-03-24: qty 14
  Filled 2011-03-24 (×2): qty 1

## 2011-03-24 MED ORDER — MIRTAZAPINE 15 MG PO TABS
7.5000 mg | ORAL_TABLET | Freq: Every day | ORAL | Status: DC
  Filled 2011-03-24 (×2): qty 0.5

## 2011-03-24 MED ORDER — QUETIAPINE FUMARATE 50 MG PO TABS
50.0000 mg | ORAL_TABLET | Freq: Three times a day (TID) | ORAL | Status: DC
  Administered 2011-03-24 – 2011-03-25 (×3): 50 mg via ORAL
  Filled 2011-03-24 (×9): qty 1

## 2011-03-24 NOTE — Discharge Planning (Signed)
New patient came to AM group even though he was feeling bad.  Admitted to drug and alcohol use, staying with mom and working here and there.  States he has heard of a program at the [facility] in [place] and would like more information about getting in there.  Will get that for him.

## 2011-03-24 NOTE — Progress Notes (Signed)
Pt was in bed upon first assessment and he was not very forth-coming at that time.  He was irritable and was getting agitated at the time asking for "godson"  He did get some zofran at [phone] with his am meds but he would not get up and come to the medication window.  Encouraged him to stay up for the first group which is discharge planning group.  Informed him that group was very important so the case manager would know what he needed for aftercare and what his intentions were for coming into the hospital.  He did go but left after his turn.  He remained in bed until lunch.  He did fill out his self-inventory and rated his depression a 9 hopelessness an 8 and anxiety an 8 too.  He denied any A/V hallucinations but stated,"not now but I have in the past"  He also was having some passive S/I without any plans or intentions and could contract to come to staff.  He was started on seroquel 50 mg TID with the first dose due at noon.  By 1700 med pass, he was questioning the dosage of the seroquel and was upset that he was prescribed 50 mg.  Informed him he had the right to refuse any medications but was encouraged to speak with the doctor and maybe she would decrease his dosage.  Again, he asked about the godson and we discussed that he would be able to talk with the doctor about that tomorrow and was given a journal to write down things he wanted to discuss with them tomorrow.  Pt's mood much better by 1700 and he was even apologetic about his earlier behaviors.  He denies he is homeless and stated,"I can live with my mom if I can't get into treatment from here"  He did say he wanted to go to the [facility] in [place] and he would be there from 6 months to a year.  We discussed the fact that he may have problems sleeping tonight since he slept most of the day.  He was ordered remeron 15 mg at bedtime for sleep and voiced understanding.

## 2011-03-24 NOTE — [facility] Suicide Risk Assessment (Signed)
Suicide Risk Assessment  Admission Assessment     Demographic factors:  See chart.  Current Mental Status:  Current Mental Status: Suicidal ideation indicated by patient;Self-harm thoughts per nursing upon admission.  Patient seen and evaluated in his room.  Pt c/o N/V. Chart reviewed. Patient stated that his mood was "not good".  C/o anxiety and difficulty sleeping. His affect was mood congruent and anxious. He denied any current thoughts of self injurious behavior, suicidal ideation or homicidal ideation. Contracted for safety on the unit.  There were no auditory or visual hallucinations reported at this time, paranoia, or delusional thought processes noted.  Thought process was linear and goal directed.  No psychomotor agitation or retardation was noted. His speech was normal rate, tone and volume. Eye contact was good. Judgment and insight are fair.  No acute safety concerns reported from team.  Loss Factors:  Loss Factors:  (Denies)  Historical Factors:  Historical Factors: Prior suicide attempts; attempted OD prior to admission; Hx reported depressive and anxiety s/s; Hx trying to hang himself; currently off Geodon and Seroquel  Risk Reduction Factors:  Risk Reduction Factors: Positive therapeutic relationship (supportive sister); willing to restart meds  CLINICAL FACTORS: Alcohol Dependence and W/D; Cocaine Abuse v. Dependence; Mood Disorder NOS; Hx MDD with psychotic features vs. BPAD   COGNITIVE FEATURES THAT CONTRIBUTE TO RISK: limited insight; impulsivity.  SUICIDE RISK: Pt viewed as a chronic increased risk of harm to self in light of his past hx and risk factors.  No acute safety concerns on the unit.  Pt contracting for safety and in need of crisis stabilization, detox & Tx.  PLAN OF CARE: Pt admitted for crisis stabilization and treatment.  Please see orders.   Medications reviewed with pt and medication education provided.  Pt willing to restart meds, collateral pending. Will  continue q15 minute checks per unit protocol.  No clinical indication for one on one level of observation at this time.  Pt contracting for safety.  Mental health treatment, medication management and continued sobriety will mitigate against the increased risk of harm to self and/or others.  Discussed the importance of recovery with pt, as well as, tools to move forward in a healthy & safe manner.  Pt agreeable with the plan.  Discussed with the team.   Lupe Carney 03/24/2011, 9:48 AM

## 2011-03-24 NOTE — Progress Notes (Signed)
Sleeping at long intervals.  Exhibiting normal sleep behavior.  Safety maintained via Q 15 minute safety checks.  No withdrawal symptome reported or observed.
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
## 2011-03-24 NOTE — Progress Notes (Signed)
[facility] Group Notes:  (Counselor/Nursing/MHT/Case Management/Adjunct)  03/24/2011 12:39 PM  Type of Therapy:  Group Therapy at 11 and 1:15  Participation Level:  Did Not Attend  Franklin [name] 03/24/2011, 4:48 PM

## 2011-03-25 MED ORDER — DIVALPROEX SODIUM ER 500 MG PO TB24
500.0000 mg | ORAL_TABLET | Freq: Every day | ORAL | Status: DC
  Administered 2011-03-25 – 2011-03-27 (×3): 500 mg via ORAL
  Filled 2011-03-25 (×2): qty 1
  Filled 2011-03-25: qty 14
  Filled 2011-03-25 (×2): qty 1

## 2011-03-25 MED ORDER — METHOCARBAMOL 500 MG PO TABS
500.0000 mg | ORAL_TABLET | Freq: Three times a day (TID) | ORAL | Status: DC | PRN
  Administered 2011-03-26: 500 mg via ORAL
  Filled 2011-03-25: qty 1

## 2011-03-25 MED ORDER — QUETIAPINE FUMARATE 25 MG PO TABS
25.0000 mg | ORAL_TABLET | Freq: Two times a day (BID) | ORAL | Status: DC
  Administered 2011-03-26: 25 mg via ORAL
  Filled 2011-03-25 (×3): qty 1

## 2011-03-25 NOTE — Progress Notes (Signed)
[facility] Group Notes:  (Counselor/Nursing/MHT/Case Management/Adjunct)  03/25/2011 12:45 PM  Type of Therapy:  Group Therapy  Participation Level:  Active  Participation Quality:  Appropriate, Attentive and Sharing  Affect:  Appropriate  Cognitive:  Alert and Appropriate  Insight:  Good  Engagement in Group:  Good  Engagement in Therapy:  Good  Modes of Intervention:  Clarification, Education, Support and Exploration  Summary of Progress/Problems: Patient reported he often finds it difficult to prioritize his responsibilities. He stated he understands that he can be self-centered and he has learned that being self-centered has effected him and the others around him. Patient seemed to relate well to the group discussing of life and balance and recognizing what constitutes and imbalanced life.    Franklin [name], Franklin [name] 03/25/2011, 12:45 PM

## 2011-03-25 NOTE — Progress Notes (Signed)
Patient ID: Franklin [name], male   DOB: 10/30/1970, 41 y.o.   MRN: [id number] Pt. In bed, avoids eye contact, says "they ain't got my meds right." pt. Reports "my  Seroquel."  Writer encouraged pt. To speak with physician in am. Pt. Reports depression as "8" out of 10. He denies SHI. Pt. Refuses group. Staff will continue to monitor q76min for safety.

## 2011-03-25 NOTE — Progress Notes (Signed)
Pt. Pleasant and cooperative.  Attended Karaoke.  Reports that he is feeling better today.  No issues voiced.

## 2011-03-25 NOTE — Progress Notes (Signed)
[facility] Group Notes:  (Counselor/Nursing/MHT/Case Management/Adjunct)  03/25/2011 7:20 PM  Type of Therapy:  Group Therapy at 1:15  Participation Level:  Did Not Attend  Clide Dales 03/25/2011, 7:20 PM

## 2011-03-25 NOTE — Progress Notes (Signed)
Pt reports mild anxiety with no hallucinations. He refused morning seroquel stating that it makes him too sleepy. MD aware. Offered support, encouragement, medications and 15 minute checks. Safety maintained on unit.

## 2011-03-25 NOTE — Progress Notes (Signed)
Signed for team.

## 2011-03-25 NOTE — Treatment Plan (Addendum)
Interdisciplinary Treatment Plan Update (Adult)  Date: 03/25/2011  Time Reviewed: 8:00 AM   Progress in Treatment: Attending groups: Yes Participating in groups: Yes Taking medication as prescribed: Yes Tolerating medication: Yes   Family/Significant other contact made: None identified  Patient understands diagnosis:  Yes  As evidenced by asking for help with substance abuse Discussing patient identified problems/goals with staff:  Yes  See below Medical problems stabilized or resolved:  Yes Denies suicidal/homicidal ideation: Yes  On self inventory and in tx team Issues/concerns per patient self-inventory:  Depression and hopelessness are 5s,  Chilling and dizziness Other:  New problem(s) identified: N/A  Reason for Continuation of Hospitalization: Depression Medication stabilization Withdrawal symptoms  Interventions implemented related to continuation of hospitalization: Medication tweaks and stabilization,  Monitor for side effects and effectiveness, encourage group attendance and participation  Additional comments:  Estimated length of stay: 2-3 days  Discharge Plan:Move to [place], live in [place]  New goal(s): N/A  Review of initial/current patient goals per problem list:   1.  Goal(s): Safely detox from alcohol  Met:  No  Target date:3/24  As evidenced by: Decrease in CIWA to 0, stable vitals  2.  Goal (s): Eliminate AH  Met:  No  Target date:3/23  As evidenced YN:WGNF report  3.  Goal(s): Identify comprehensive sobriety plan  Met:  Yes  Target date:3/21  As evidenced AO:ZHYQM to [facility] in [place], meetings, mental health  4.  Goal(s):Eliminate SI  Met:  Yes  Target date:3/21  As evidenced VH:QION report in treatment team  Attendees: Patient:  [place] [name] 03/25/2011 8:00 AM  Family:     Physician:  Lupe Carney 03/25/2011 8:00 AM   Nursing: Waynetta Sandy   03/25/2011 8:00 AM   Case Manager:  Richelle Ito, LCSW 03/25/2011  8:00 AM   Counselor:  Ronda Fairly, LCSWA 03/25/2011 8:00 AM   Other:     Other:     Other:     Other:      Scribe for Treatment Team:   Daryel Gerald B, 03/25/2011 8:00 AM

## 2011-03-25 NOTE — Progress Notes (Signed)
[facility] MD Progress Note  03/25/2011 2:42 PM  S/O: Current Mental Status: Patient seen and evaluated in treatment team.  Chart reviewed. Patient stated that his mood was "better". C/o anxiety, irritability, mood swings and difficulty sleeping. His affect was mood congruent and anxious. He denied any current thoughts of self injurious behavior, suicidal ideation or homicidal ideation. Contracted for safety on the unit. There were no auditory or visual hallucinations reported at this time, paranoia, or delusional thought processes noted. Thought process was linear and goal directed. No psychomotor agitation or retardation was noted. His speech was normal rate, tone and volume. Eye contact was good. Judgment and insight are fair. No acute safety concerns reported from team. Sedation reported with 50 tid Seroquel.  Sleep:  Number of Hours: 6.5    Vital Signs:Blood pressure 130/70, pulse 108, temperature 98.5 F (36.9 C), temperature source Oral, resp. rate 20, height 5\' 10"  (1.778 m), weight 85.73 kg (189 lb).  Lab Results: No results found for this or any previous visit (from the past 48 hour(s)).  Physical Findings: CIWA:  CIWA-Ar Total: 0   A/P: Alcohol Dependence and W/D; Cocaine Abuse v. 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  Open to trying mood stabilizer for c/o irritability, isolation and mood swings. Medication education completed.  Pros, cons, risks, potential side effects and benefits were discussed with pt.  Pt agreeable with the plan.  See orders.  Initiated Depakote for mood stability, decreased Seroquel s/p sedation, and started Robaxin prn for c/o pain and muscle spasms.  Pt used Flexeril in past. Discussed with team.  Lupe Carney 03/25/2011, 2:42 PM

## 2011-03-25 NOTE — Discharge Planning (Signed)
Pt present in morning group, good participation. Pt plans to relocate to [place] after discharge and hopes to get into an [facility] house there. Pt given list of [facility] houses to call. Pt states that his probation officer is supportive of him moving to [place] and they both feel it will be beneficial for him to be away from familiar surroundings. Pt scheduled for follow up for mental health in [place] with [facility]. Pt reports that his mother and sister are good supports and will be able to provide transportation for him to [place].

## 2011-03-25 NOTE — BHH Counselor (Signed)
Adult Comprehensive Assessment  Patient ID: [place] [name], male   DOB: November 23, 1970, 41 y.o.   MRN: [id number]  Information Source: Information source: Patient  Current Stressors:  Educational / Learning stressors: no porblems reported Employment / Job issues: unemployed Family Relationships: fair support from family Surveyor, quantity / Lack of resources (include bankruptcy): no income Housing / Lack of housing: living with mother Physical health (include injuries & life threatening diseases): no problems reported Social relationships: lacks social support Substance abuse: alcohol 12 pk daily, crack-1 gram daily Bereavement / Loss: none reported  Living/Environment/Situation:  Living Arrangements: Family members (mother) Living conditions (as described by patient or guardian): okay, positive How long has patient lived in current situation?: 1 year What is atmosphere in current home: Comfortable  Family History:  Marital status: Single Does patient have children?: Yes How many children?: 1  (son age 50) How is patient's relationship with their children?: good  Childhood History:  By whom was/is the patient raised?: Both parents Description of patient's relationship with caregiver when they were a child: positive with both Patient's description of current relationship with people who raised him/her: positive with mother, father deceased in 51. Does patient have siblings?: Yes Number of Siblings: 9  (8 sisters and 1 brother) Description of patient's current relationship with siblings: good Did patient suffer any verbal/emotional/physical/sexual abuse as a child?: No Did patient suffer from severe childhood neglect?: No Has patient ever been sexually abused/assaulted/raped as an adolescent or adult?: No Was the patient ever a victim of a crime or a disaster?: No Witnessed domestic violence?: No Has patient been effected by domestic violence as an adult?: No  Education:  Highest grade of  school patient has completed: 2 years of college at [employer] in Religion and Philsophy Currently a student?: No Learning disability?: No  Employment/Work Situation:   Employment situation: Unemployed Patient's job has been impacted by current illness: Yes Describe how patient's job has been implacted: hasn't been able to work in 3-4 years What is the longest time patient has a held a job?: 2 years 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 Has patient ever been in the [employer]?: No Has patient ever served in Buyer, retail?: No  Financial Resources:   Financial resources: No income Does patient have a Lawyer or guardian?: No  Alcohol/Substance Abuse:   What has been your use of drugs/alcohol within the last 12 months?: alcohol - 12 pkg daily, crack - 1 gram daily If attempted suicide, did drugs/alcohol play a role in this?: No Alcohol/Substance Abuse Treatment Hx: Past Tx, Inpatient If yes, describe treatment: [facility] Has alcohol/substance abuse ever caused legal problems?: Yes (shoplifting)  Social Support System:   Patient's Community Support System: Fair Museum/gallery exhibitions officer System: mother, sister Type of faith/religion: none How does patient's faith help to cope with current illness?: N/A  Leisure/Recreation:   Leisure and Hobbies: nothing, isolates  Strengths/Needs:   What things does the patient do well?: none identified In what areas does patient struggle / problems for patient: everything  Discharge Plan:   Does patient have access to transportation?: Yes (sister) Will patient be returning to same living situation after discharge?: Yes (mother's) Currently receiving community mental health services: Yes (From Whom) (mental health in [place] but hasn't seen in 4-5 months) If no, would patient like referral for services when discharged?: Yes (What county[place] Medical sales representative ) Does patient have financial barriers related  to discharge medications?: Yes Patient description of barriers related  to discharge medications: No income  Summary/Recommendations:   Summary and Recommendations (to be completed by the evaluator): Patient is 41 YO single unemployed [place] American male admitted with diagnosis of Major Depression, Recurrent severe with psychotic feature; alcohol dependence; cocaine dependence. Patient reports after being off psychotropic medications for several months he attempted to overdose prior to admission and was stopped by sister. Patient will benefit from crisis stabilization,  medication evaluation, group therapy and psychoeducation in addition to case management for discharge planning.   Clide Dales. 03/25/2011

## 2011-03-26 DIAGNOSIS — F339 Major depressive disorder, recurrent, unspecified: Secondary | ICD-10-CM

## 2011-03-26 MED ORDER — QUETIAPINE FUMARATE 25 MG PO TABS
12.5000 mg | ORAL_TABLET | Freq: Two times a day (BID) | ORAL | Status: DC
  Administered 2011-03-26: 12.5 mg via ORAL
  Filled 2011-03-26 (×4): qty 1
  Filled 2011-03-26 (×2): qty 7

## 2011-03-26 MED ORDER — CITALOPRAM HYDROBROMIDE 20 MG PO TABS
20.0000 mg | ORAL_TABLET | Freq: Every day | ORAL | Status: DC
  Administered 2011-03-26 – 2011-03-27 (×3): 20 mg via ORAL
  Filled 2011-03-26 (×3): qty 1
  Filled 2011-03-26: qty 14
  Filled 2011-03-26: qty 1

## 2011-03-26 NOTE — Progress Notes (Signed)
Pt laying in bed resting with eyes closed. Respirations even and unlabored. No distress noted.  

## 2011-03-26 NOTE — Progress Notes (Signed)
Pt restless interacting on unit. Pt reports that Seroquel made him too sleepy this morning and he continues to report anxiety. Pt requested information on depakote. Gave pt mosby notes on depakote. Offered support and encouragement. Safety maintained on unit.

## 2011-03-26 NOTE — Progress Notes (Signed)
[facility] Group Notes:  (Counselor/Nursing/MHT/Case Management/Adjunct)  03/26/2011 3:50 PM  Type of Therapy:  1:15PM Group Therapy  Participation Level:  Active  Participation Quality:  Appropriate, Attentive and Sharing  Affect:  Appropriate  Cognitive:  Alert and Appropriate  Insight:  Good  Engagement in Group:  Good  Engagement in Therapy:  Good  Modes of Intervention:  Clarification, Education, Support and Exploration  Summary of Progress/Problems: Patient reported having a difficult time being able to radically accept the past and try to move forward by living in the present moment. Patient seemed to relate well with peers in discussing radical acceptance. Patient stated in recognizing his triggers often motivates him to use substances. He stated he believes "triggers" are overrated, and recognizing them does not typically work for him in seeking recovery. Patient actively engaged in group discussion.   [name], [name] 03/26/2011, 3:50 PM

## 2011-03-26 NOTE — Discharge Planning (Signed)
Pt present in morning group. Pt verbalized the importance of being able to leave the hospital today. Pt concerned about not being able to get in touch with his probation officer and fears that this will result in his arrest when he leaves the hospital. Pt was eventually able to get in touch with his probation officer and get approval to finish his treatment in the hospital. Pt agreed to stay an extra day to get stabilized on meds. Will f/u with [facility] in [place] for meds and plans to interview with an [facility] in [place] on Sunday. Pt is scheduled to d/c tomorrow 3/22.

## 2011-03-26 NOTE — Progress Notes (Signed)
[facility] Adult Inpatient Family/Significant Other Suicide Prevention Education  Suicide Prevention Education:  Education Completed; Ms [place] [name], sister, at [phone] has been identified by the patient as the family member who will aid the patient in the event of a mental health crisis (suicidal ideations/suicide attempt).  With written consent from the patient, the family member/significant other has been provided the following suicide prevention education, prior to the and/or following the discharge of the patient.  The suicide prevention education provided includes the following:  Suicide risk factors  Suicide prevention and interventions  National Suicide Hotline telephone number  [facility] assessment telephone number  [facility] Emergency Assistance 911  [facility] and/or Residential Mobile Crisis Unit telephone number  Request made of family/significant other to:  Remove weapons (e.g., guns, rifles, knives), all items previously/currently identified as safety concern.    Remove drugs/medications (over-the-counter, prescriptions, illicit drugs), all items previously/currently identified as a safety concern.  Ms [place] [name] states the patient will have no access to narcotics nor firearms in the event he comes to families home before making move to [place], [place]  The family member/significant other verbalizes understanding of the suicide prevention education information provided.  The family member/significant other agrees to remove the items of safety concern listed above.  Clide Dales 03/26/2011, 6:23 PM

## 2011-03-26 NOTE — Progress Notes (Signed)
[facility] MD Progress Note  03/26/2011 2:33 PM  S/O: Patient seen and evaluated in treatment team.  Chart reviewed. Patient stated that his mood was "better". C/o anxiety, irritability, mood swings and difficulty sleeping. His affect was mood congruent and anxious. Originally, wanted to go home but agreed to stay another day for continued Tx. He denied any current thoughts of self injurious behavior, suicidal ideation or homicidal ideation. Contracted for safety on the unit. There were no auditory or visual hallucinations reported at this time, paranoia, or delusional thought processes noted. Thought process was linear and goal directed. No psychomotor agitation or retardation was noted. His speech was normal rate, tone and volume. Eye contact was good. Judgment and insight are fair. No acute safety concerns reported from team.   Sleep:  Number of Hours: 6    Vital Signs:Blood pressure 121/73, pulse 78, temperature 97 F (36.1 C), temperature source Oral, resp. rate 18, height 5\' 10"  (1.778 m), weight 85.73 kg (189 lb).  Lab Results: No results found for this or any previous visit (from the past 48 hour(s)).  Physical Findings: CIWA:  CIWA-Ar Total: 0   A/P: Alcohol Dependence and W/D; Cocaine Abuse v. Dependence; Mood Disorder NOS; r/o BPAD with co-morbid Anxiety  Pt on Seroquel, Geodon and Celexa in past.  Open to trying mood stabilizer for c/o irritability, isolation and mood swings. Depakote started yesterday.  Sedation with 25 mg bid of Seroquel, decreased to 12.5 mg bid today. Added back citalopram for c/o continued anxiety s/s.  Medication education completed.  Pros, cons, risks, potential side effects and benefits were discussed with pt.  Pt agreeable with the plan.  See orders.  Discussed with team.  [place] [name] 03/26/2011, 2:33 PM

## 2011-03-26 NOTE — Progress Notes (Signed)
[facility] Group Notes:  (Counselor/Nursing/MHT/Case Management/Adjunct)  03/26/2011 5:31 PM  Type of Therapy:  Group Therapy at 11:00  Participation Level:  Did Not Attend   Clide Dales 03/26/2011, 5:31 PM

## 2011-03-26 NOTE — Progress Notes (Signed)
Cosigned by Carney Bern, LCSWA 3/22/20136:53 PM

## 2011-03-27 MED ORDER — QUETIAPINE 12.5 MG HALF TABLET: 12.5000 mg | ORAL_TABLET | Freq: Two times a day (BID) | ORAL | Status: DC

## 2011-03-27 MED ORDER — DIVALPROEX SODIUM ER 500 MG PO TB24: 500.0000 mg | ORAL_TABLET | Freq: Every day | ORAL | Status: DC

## 2011-03-27 MED ORDER — CITALOPRAM HYDROBROMIDE 20 MG PO TABS: 20.0000 mg | ORAL_TABLET | Freq: Every day | ORAL | Status: DC

## 2011-03-27 MED ORDER — MIRTAZAPINE 15 MG PO TABS: 15.0000 mg | ORAL_TABLET | Freq: Every day | ORAL | Status: AC

## 2011-03-27 NOTE — BHH Suicide Risk Assessment (Signed)
Suicide Risk Assessment  Discharge Assessment     Demographic factors:  Male;Low socioeconomic status;Unemployed    Current Mental Status Per Nursing Assessment::   On Admission:  Suicidal ideation indicated by patient;Self-harm thoughts At Discharge:     Current Mental Status Per Physician:  Loss Factors:  (Denies)  Historical Factors: Prior suicide attempts  Risk Reduction Factors:      Continued Clinical Symptoms:  Depression:   Recent sense of peace/wellbeing  Discharge Diagnoses:   AXIS I:  Depressive Disorder NOS AXIS II:  deferred AXIS III:   Past Medical History  Diagnosis Date  . Depression   . Anxiety   . Bipolar 1 disorder   . No pertinent past medical history    AXIS IV:  economic problems, housing problems, other psychosocial or environmental problems and problems related to social environment AXIS V:  51-60 moderate symptoms  Cognitive Features That Contribute To Risk:  Loss of executive function    Suicide Risk:  Minimal: No identifiable suicidal ideation.  Patients presenting with no risk factors but with morbid ruminations; may be classified as minimal risk based on the severity of the depressive symptoms  Plan Of Care/Follow-up recommendations:  Activity:  Unrestricted Diet:  Regular Other:  Referral to AA meetings, referral to psychiatrist and therapy  Zulay Corrie 03/27/2011, 10:28 AM

## 2011-03-27 NOTE — Progress Notes (Signed)
[facility] Case Management Discharge Plan:  Will you be returning to the same living situation after discharge: No. pt is going to his mothers home tonight and then to  [place] to interview at an [facility] Sunday. His sister will transport him to [employer]. At discharge, do you have transportation home?:Yes,  Pt was given a bus ticket to get to [facility] his mother will pick him up there tonight. Do you have the ability to pay for your medications:Yes,    Interagency Information:     Release of information consent forms completed and in the chart;  Patient's signature needed at discharge.  Patient to Follow up at:  Follow-up Information    Follow up with [facility]. (Next week: Walk-in clinic between 9:00am and 3:00pm. Should arrive as early as possible to decrease prolonged wait time)    Contact information:   [address], [place], [place] [address] [phone]         Patient denies SI/HI:   Yes,      Safety Planning and Suicide Prevention discussed:  Yes,  with pt and with family  Barrier to discharge identified:No.  Summary and Recommendations:Pt was cleared for D/C and denies any and all S/I and H/I.  Pt will go to [facility] today and get his records transferred to [place]. He will go to [place] tomorrow via his sister to interview with an [facility] house.   [facility] 03/27/2011, 9:36 AM

## 2011-03-27 NOTE — Discharge Summary (Signed)
Physician Discharge Summary Note  Patient:  [place] [name] is an 41 y.o., male MRN:  [id number] DOB:  [birth year] Patient phone:  [phone] (home)  Patient address:   [address] [place] [address],   Date of Admission:  03/23/2011 Date of Discharge: 03/27/2011 Reason for Admission: Suicide attempt by OD, with substance abuse, (cocaine) and alcohol.  Discharge Diagnoses: Active Problems:  * No active hospital problems. *    Axis Diagnosis:  Axis l. Alcohol Dependence and W/D; Cocaine Abuse v. 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 Deferred Axis lll.  Axis lV. Axis V.  Level of Care:  OP  Hospital Course:  [place] [name] was admitted for crisis management and stabilization due to his recent increase in depression and suicidal ideation related to his alcohol and cocaine abuse.  He was treated with the standard Librium protocol for alcohol withdrawal.  He was restarted on a lower dose of seroquel for anxiety and then depakote for mood stabilization and management of his anger issues as well.  [name] met with the treatment team and planned to follow up with the [facility] center for his meds upon discharge.  He also had a great deal of anxiety related to missing an appointment with his probation officer and our staff attempted contact with the officer to alleviate some of his anxiety.  [place] [name] did well and responded quickly to the medication and support he received on the unit.  He attended groups and met with the therapist.  It was felt that he could benefit by completing the detox before being discharged and he agreed to do so.   Consults:  None  Significant Diagnostic Studies:  None  Discharge Vitals:   Blood pressure 153/98, pulse 99, temperature 97.5 F (36.4 C), temperature source Oral, resp. rate 18, height 5\' 10"  (1.778 m), weight 85.73 kg (189 lb).  Mental Status Exam: See Mental Status Examination and Suicide Risk Assessment completed by  Attending Physician prior to discharge.  Discharge destination:  Home  Is patient on multiple antipsychotic therapies at discharge:  No   Has Patient had three or more failed trials of antipsychotic monotherapy by history:  No  Recommended Plan for Multiple Antipsychotic Therapies: Not applicable Discharge Orders    Future Orders Please Complete By Expires   Diet - low sodium heart healthy      Increase activity slowly      Discharge instructions      Comments:   Take all medication as prescribed.  Avoid all alcohol.  Keep follow up appointments as scheduled.     Medication List  As of 03/27/2011 10:12 AM   TAKE these medications      Indication    citalopram 20 MG tablet   Commonly known as: CELEXA   Take 1 tablet (20 mg total) by mouth daily.    Indication: Depression, Social Anxiety Disorder      divalproex 500 MG 24 hr tablet   Commonly known as: DEPAKOTE ER   Take 1 tablet (500 mg total) by mouth daily.    Indication: mood stabilization      mirtazapine 15 MG tablet   Commonly known as: REMERON   Take 1 tablet (15 mg total) by mouth at bedtime.    Indication: Trouble Sleeping      QUEtiapine 12.5 mg Tabs   Commonly known as: SEROQUEL   Take 0.5 tablets (12.5 mg total) by mouth 2 (two) times daily.    Indication:  anxiety           Follow-up Information    Follow up with [facility]. (Next week: Walk-in clinic between 9:00am and 3:00pm. Should arrive as early as possible to decrease prolonged wait time)    Contact information:   [address], [place], [place] [address] [phone]         Follow-up recommendations:  Activity:  as tolerated. Diet:  heart healthy Tests:  follow up with [facility] as scheduled.  Comments:  Keep all scheduled appointments, avoid all alcohol and recreational drugs.  Signed: Rona Ravens. Brinn Westby PAC For Dr. Lupe Carney 03/27/2011, 10:12 AM

## 2011-03-27 NOTE — Progress Notes (Signed)
Patient ID: [place] [name], male   DOB: 22-Jun-1970, 41 y.o.   MRN: [id number] The patient is pleasant and cooperative. He attended the evening substance abuse group. Retired to bed early. Stated that he is probably being discharged tomorrow and wanted a good night sleep. No symptoms of withdrawal.

## 2011-03-27 NOTE — Progress Notes (Addendum)
03-27-11 @ 1143 nursing discharge note; pt denied any si/hi and a/v hallucinations. He stated he was ready for d/c and understood his d/c instructions. He was given his personal belongings, bus tickets, sample meds and prescriptions. He was escorted to the lobby. He stated he would be meeting his family.

## 2011-03-27 NOTE — Progress Notes (Signed)
Patient ID: [name], male   DOB: [birth year], 41 y.o.   MRN: [id number]  [facility] Group Notes:  (Counselor/Nursing/MHT/Case Management/Adjunct)  03/27/2011 1:15 PM  Type of Therapy:  Group Therapy, Dance/Movement Therapy   Participation Level:  Minimal  Participation Quality:  Appropriate  Affect:  Appropriate  Cognitive:  Appropriate  Insight:  Limited  Engagement in Group:  Limited  Engagement in Therapy:  Limited  Modes of Intervention:  Clarification, Problem-solving, Role-play, Socialization and Support  Summary of Progress/Problems:  Therapist discussed with group the term self-sabotage and enabling. Group identified reasons for turning to self sabotaging behaviors. Therapist processed with group ways to develop positive and healthy alternatives.  Therapist asked group to reflect on one positive characteristic about themselves in order to take the place of negative thoughts which can lead to self-sabotaging behaviors.     Rhunette Croft

## 2011-04-01 NOTE — Progress Notes (Signed)
Patient Discharge Instructions:  Psychiatric Admission Assessment Note Provided,  03/31/2011 After Visit Summary (AVS) Provided,  03/31/2011 Face Sheet Provided, 03/31/2011 Faxed/Sent to the Next Level Care provider:  03/31/2011 Sent Suicide Risk Assessment - Discharge Assessment 03/31/2011  Faxed to [facility]) @ [phone]  Wandra Scot, 04/01/2011, 10:10 AM

## 2011-06-28 ENCOUNTER — Emergency Department (HOSPITAL_COMMUNITY)
Admission: EM | Admit: 2011-06-28 | Discharge: 2011-06-28 | Discharge status: Against Medical Advice | Payer: Self-pay | Attending: Emergency Medicine | Admitting: Emergency Medicine

## 2011-06-28 DIAGNOSIS — Z0389 Encounter for observation for other suspected diseases and conditions ruled out: Secondary | ICD-10-CM | POA: Insufficient documentation

## 2011-06-28 NOTE — ED Notes (Signed)
Pt called for triage  Pt in triage room 2 and refused to be triaged until his sister comes here and brings him some clothes  Pt back to lobby to wait for his sister

## 2011-06-28 NOTE — ED Notes (Signed)
Pt never returned for triage

## 2012-03-11 IMAGING — CR DG CHEST 2V
2 series · 2 of 2 positions shown · non-contrast
Comparison: 12/18/2009

CLINICAL DATA: Chest pain

CHEST - 2 VIEW

[w chest pa]
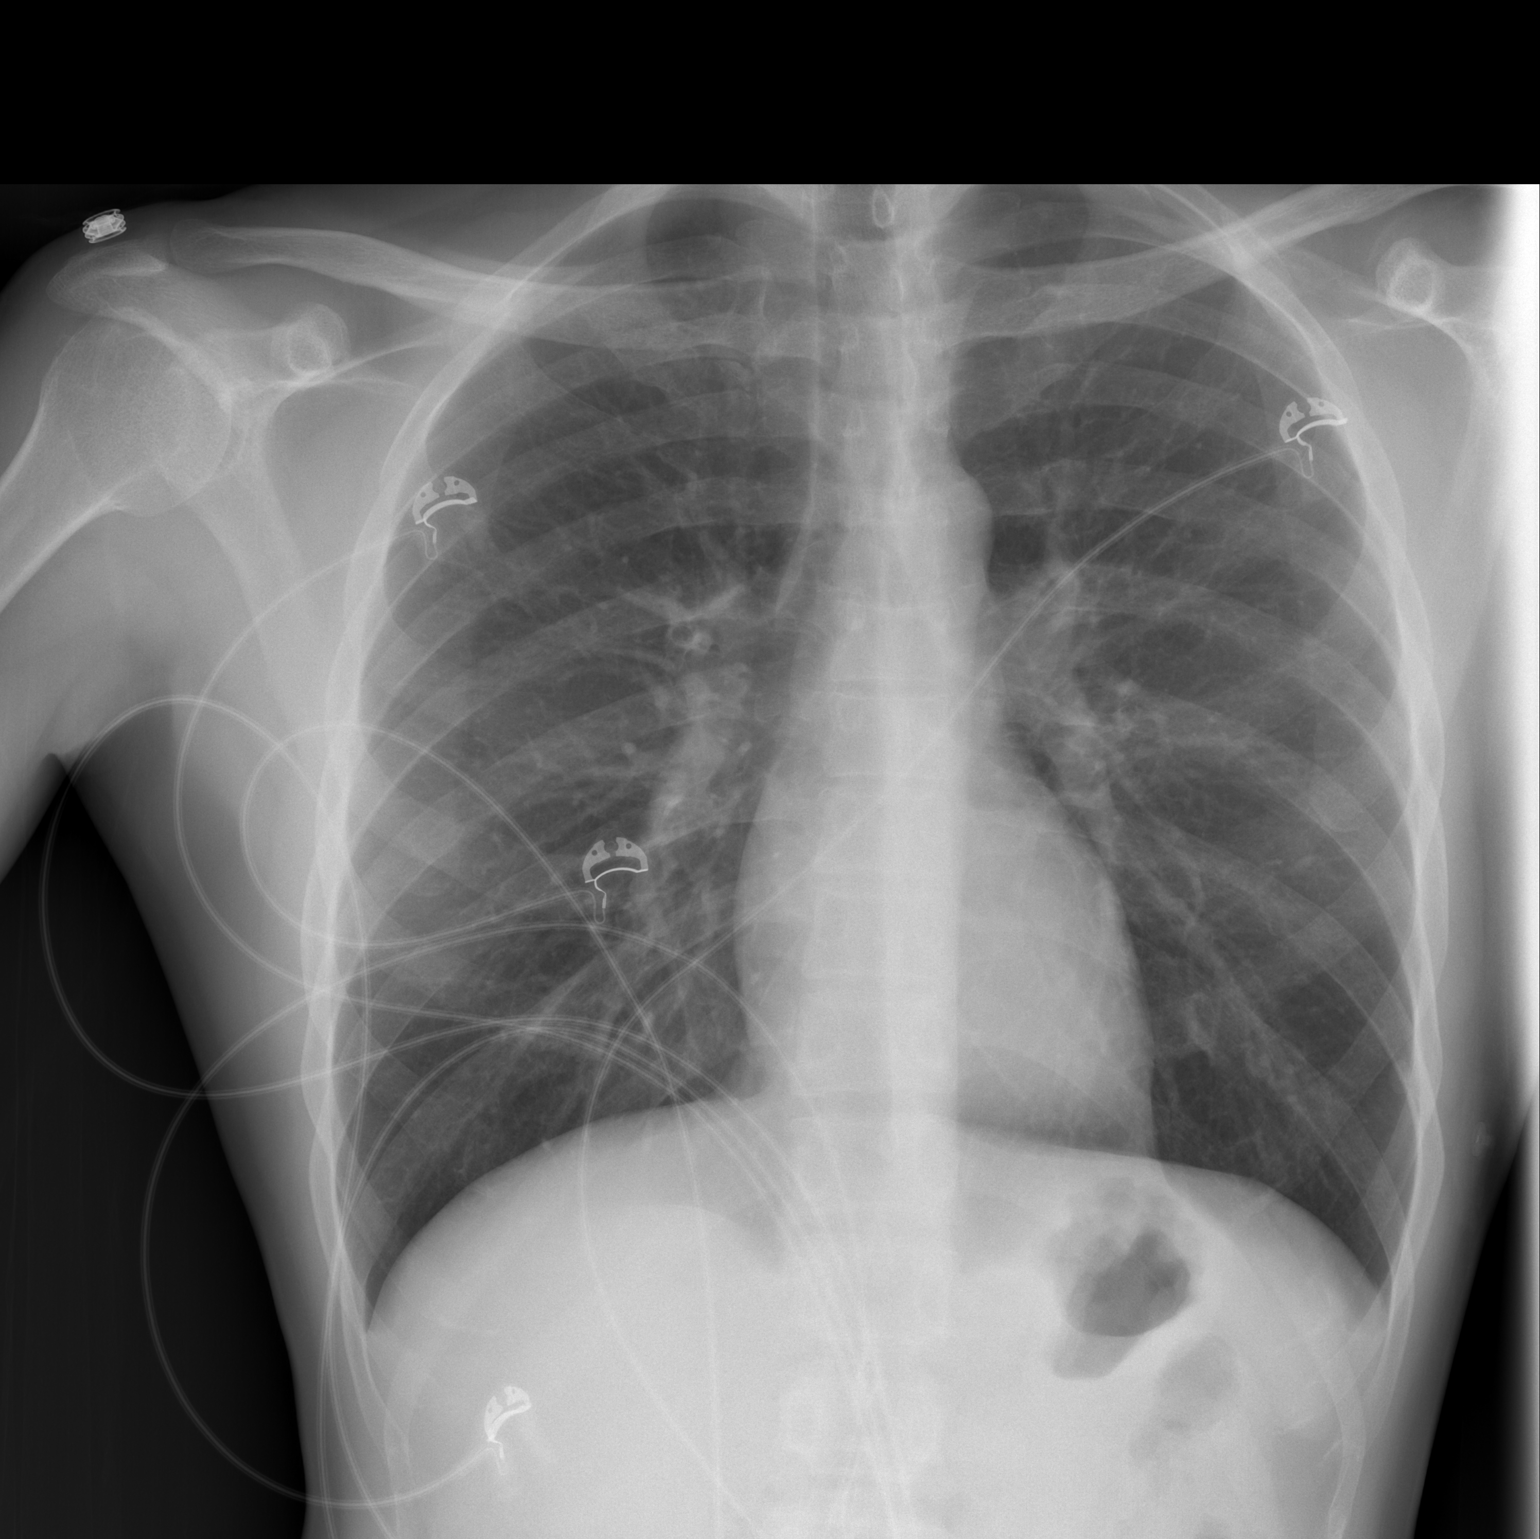

[w chest lat]
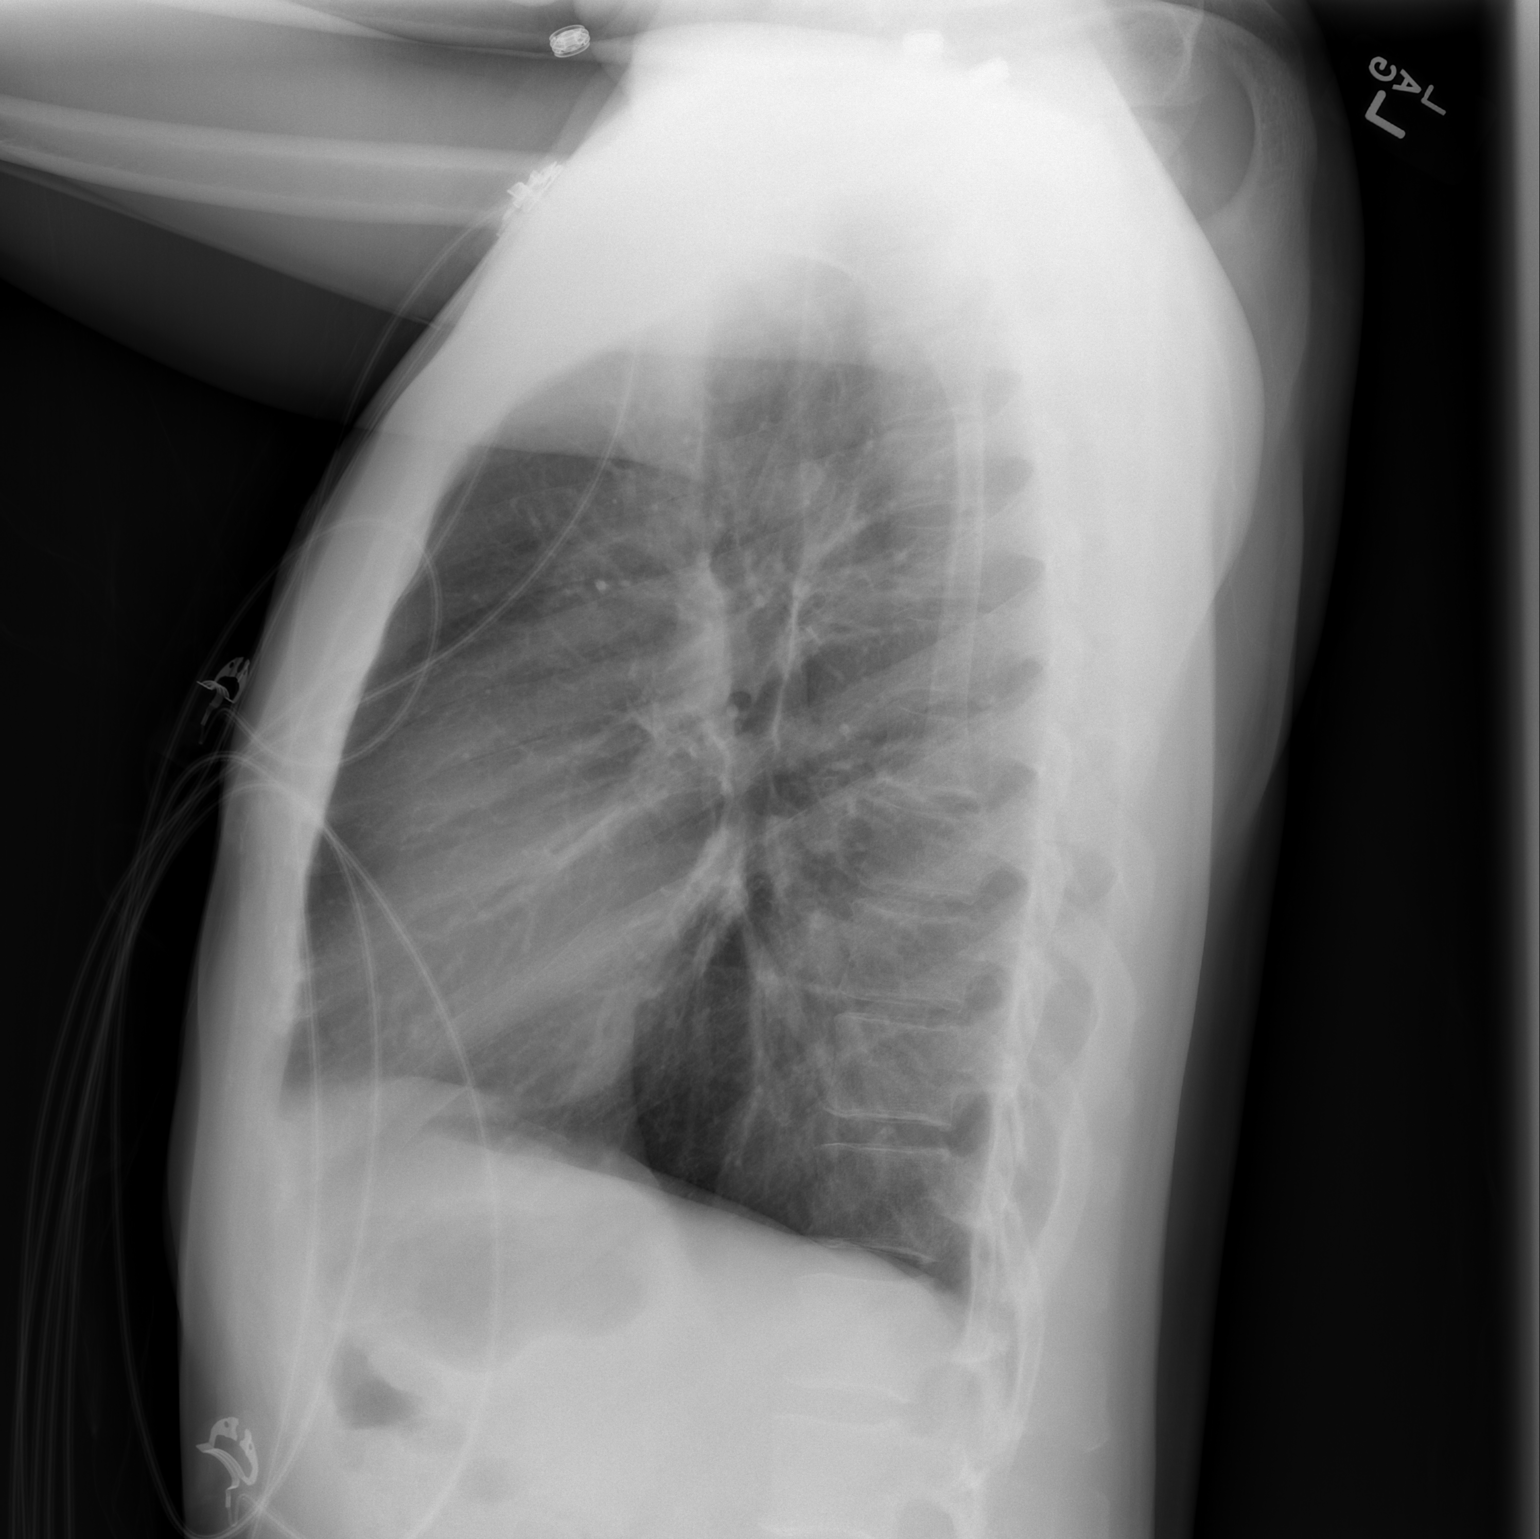

[2 of 2 positions shown; findings below may reference images not displayed]

FINDINGS: The heart size and pulmonary vascularity are normal. The
lungs appear clear and expanded without focal air space disease or
consolidation. No blunting of the costophrenic angles.
IMPRESSION: No evidence of active pulmonary disease.  No significant change
since previous study.

## 2012-03-26 ENCOUNTER — Emergency Department (HOSPITAL_COMMUNITY)
Admission: EM | Admit: 2012-03-26 | Discharge: 2012-03-27 | Disposition: A | Discharge status: Home / Self Care | Payer: Self-pay | Attending: Emergency Medicine | Admitting: Emergency Medicine

## 2012-03-26 ENCOUNTER — Encounter (HOSPITAL_COMMUNITY): Payer: Self-pay | Admitting: Emergency Medicine

## 2012-03-26 DIAGNOSIS — F319 Bipolar disorder, unspecified: Secondary | ICD-10-CM | POA: Insufficient documentation

## 2012-03-26 DIAGNOSIS — F1010 Alcohol abuse, uncomplicated: Secondary | ICD-10-CM | POA: Insufficient documentation

## 2012-03-26 DIAGNOSIS — F17200 Nicotine dependence, unspecified, uncomplicated: Secondary | ICD-10-CM | POA: Insufficient documentation

## 2012-03-26 DIAGNOSIS — Z8659 Personal history of other mental and behavioral disorders: Secondary | ICD-10-CM | POA: Insufficient documentation

## 2012-03-26 DIAGNOSIS — F1410 Cocaine abuse, uncomplicated: Secondary | ICD-10-CM | POA: Insufficient documentation

## 2012-03-26 DIAGNOSIS — F1910 Other psychoactive substance abuse, uncomplicated: Secondary | ICD-10-CM

## 2012-03-26 DIAGNOSIS — R443 Hallucinations, unspecified: Secondary | ICD-10-CM

## 2012-03-26 LAB — CBC
HCT: 46.5 % (ref 39.0–52.0)
Hemoglobin: 16.2 g/dL (ref 13.0–17.0)
MCH: 29.9 pg (ref 26.0–34.0)
MCHC: 34.8 g/dL (ref 30.0–36.0)
MCV: 86 fL (ref 78.0–100.0)
Platelets: 330 10*3/uL (ref 150–400)
RBC: 5.41 MIL/uL (ref 4.22–5.81)
RDW: 14.1 % (ref 11.5–15.5)
WBC: 9 10*3/uL (ref 4.0–10.5)

## 2012-03-26 LAB — COMPREHENSIVE METABOLIC PANEL
ALT: 28 U/L (ref 0–53)
AST: 32 U/L (ref 0–37)
Albumin: 4.1 g/dL (ref 3.5–5.2)
Alkaline Phosphatase: 72 U/L (ref 39–117)
BUN: 12 mg/dL (ref 6–23)
CO2: 27 mEq/L (ref 19–32)
Calcium: 9.7 mg/dL (ref 8.4–10.5)
Chloride: 97 mEq/L (ref 96–112)
Creatinine, Ser: 0.92 mg/dL (ref 0.50–1.35)
GFR calc Af Amer: >90 mL/min (ref >90)
GFR calc non Af Amer: >90 mL/min (ref >90)
Glucose, Bld: 83 mg/dL (ref 70–99)
Potassium: 3.9 mEq/L (ref 3.5–5.1)
Sodium: 135 mEq/L (ref 135–145)
Total Bilirubin: 0.8 mg/dL (ref 0.3–1.2)
Total Protein: 7.8 g/dL (ref 6.0–8.3)

## 2012-03-26 LAB — RAPID URINE DRUG SCREEN, HOSP PERFORMED
Amphetamines: NOT DETECTED
Barbiturates: NOT DETECTED
Benzodiazepines: NOT DETECTED
Cocaine: POSITIVE — AB
Opiates: NOT DETECTED
Tetrahydrocannabinol: NOT DETECTED

## 2012-03-26 LAB — ETHANOL: Alcohol, Ethyl (B): 11 mg/dL (ref 0–11)

## 2012-03-26 LAB — ACETAMINOPHEN LEVEL: Acetaminophen (Tylenol), Serum: <15 ug/mL (ref 10–30)

## 2012-03-26 LAB — SALICYLATE LEVEL: Salicylate Lvl: <2 mg/dL — ABNORMAL LOW (ref 2.8–20.0)

## 2012-03-26 MED ORDER — IBUPROFEN 600 MG PO TABS: 600.0000 mg | ORAL_TABLET | Freq: Three times a day (TID) | ORAL | Status: DC | PRN

## 2012-03-26 MED ORDER — ACETAMINOPHEN 325 MG PO TABS: 650.0000 mg | ORAL_TABLET | ORAL | Status: DC | PRN

## 2012-03-26 MED ORDER — LORAZEPAM 2 MG/ML IJ SOLN: 1.0000 mg | Freq: Four times a day (QID) | INTRAMUSCULAR | Status: DC | PRN

## 2012-03-26 MED ORDER — THIAMINE HCL 100 MG/ML IJ SOLN: 100.0000 mg | Freq: Every day | INTRAMUSCULAR | Status: DC

## 2012-03-26 MED ORDER — ONDANSETRON HCL 4 MG PO TABS: 4.0000 mg | ORAL_TABLET | Freq: Three times a day (TID) | ORAL | Status: DC | PRN

## 2012-03-26 MED ORDER — FOLIC ACID 1 MG PO TABS
1.0000 mg | ORAL_TABLET | Freq: Every day | ORAL | Status: DC
  Administered 2012-03-26 – 2012-03-27 (×2): 1 mg via ORAL
  Filled 2012-03-26 (×2): qty 1

## 2012-03-26 MED ORDER — LORAZEPAM 1 MG PO TABS: 1.0000 mg | ORAL_TABLET | Freq: Four times a day (QID) | ORAL | Status: DC | PRN

## 2012-03-26 MED ORDER — VITAMIN B-1 100 MG PO TABS
100.0000 mg | ORAL_TABLET | Freq: Every day | ORAL | Status: DC
  Administered 2012-03-26 – 2012-03-27 (×2): 100 mg via ORAL
  Filled 2012-03-26 (×2): qty 1

## 2012-03-26 MED ORDER — ADULT MULTIVITAMIN W/MINERALS CH
1.0000 | ORAL_TABLET | Freq: Every day | ORAL | Status: DC
  Administered 2012-03-26 – 2012-03-27 (×2): 1 via ORAL
  Filled 2012-03-26 (×2): qty 1

## 2012-03-26 NOTE — ED Notes (Signed)
ACT in w/ pt 

## 2012-03-26 NOTE — BH Assessment (Signed)
Assessment Note   Franklin [name] is an 42 y.o. male.   Pt is a cooperative and well mannered pt he presents in ED with desire for tx for depression, AVH and SA.  Pt has hx of schizophrenia, has been off meds, relapsed using alcohol 4 days ago and verbalizes depression with life stressors  Pt has put together 10 mths of sobriety prior to this relapse and 3 years before that.  Pt has ability to recover.  Pt has plan to hang self.  Pt reports he has tried this before and believes it is the best way to end his life.  Pt has had prior inptx placement with the most recent 2012 with [facility].    Pt made fair eye contact, judgement impaired, speech soft, affect flat, tearful, cooperative.  Recommend inptx placement.  Pt can't contract for safety.  He has voices given commands but not to hurt self or others. Pt has active plan to end life.  Axis I: Schizoaffective Disorder and alcohol dep Axis II: Deferred Axis III:  Past Medical History  Diagnosis Date  . Depression   . Anxiety   . Bipolar 1 disorder   . No pertinent past medical history    Axis IV: educational problems, housing problems, occupational problems, other psychosocial or environmental problems, problems related to social environment and problems with primary support group Axis V: 31-40 impairment in reality testing  Past Medical History:  Past Medical History  Diagnosis Date  . Depression   . Anxiety   . Bipolar 1 disorder   . No pertinent past medical history     Past Surgical History  Procedure Laterality Date  . No past surgeries      Family History:  Family History  Problem Relation Age of Onset  . Hypertension Other   . Diabetes Other     Social History:  reports that he has been smoking Cigarettes.  He has been smoking about 0.50 packs per day. He does not have any smokeless tobacco history on file. He reports that he drinks about 7.2 ounces of alcohol per week. He reports that he uses illicit drugs  (Cocaine).  Additional Social History:  Alcohol / Drug Use Pain Medications: na Prescriptions: na Over the Counter: na History of alcohol / drug use?: Yes Longest period of sobriety (when/how long): 3 yrs Negative Consequences of Use: Financial;Personal relationships;Work / Mining engineer #1 Name of Substance 1: alcohol 1 - Age of First Use: teen 1 - Amount (size/oz): case of beer 1 - Frequency: daily 1 - Duration: 4 day binge 1 - Last Use / Amount: 03-26-12 Substance #2 Name of Substance 2: crack cocaine 2 - Age of First Use: 20s 2 - Amount (size/oz): varies 2 - Frequency: daily 2 - Duration: 4 day binge 2 - Last Use / Amount: 03-26-12  CIWA: CIWA-Ar BP: 125/75 mmHg Pulse Rate: 90 Nausea and Vomiting: no nausea and no vomiting Tactile Disturbances: none Tremor: no tremor Auditory Disturbances: not present Paroxysmal Sweats: no sweat visible Visual Disturbances: not present Anxiety: no anxiety, at ease Headache, Fullness in Head: none present Agitation: normal activity Orientation and Clouding of Sensorium: oriented and can do serial additions CIWA-Ar Total: 0 COWS:    Allergies: No Known Allergies  Home Medications:  (Not in a hospital admission)  OB/GYN Status:  No LMP for male patient.  General Assessment Data [facility] of [facility]: [facility] Living Arrangements: Parent Can pt return to current living arrangement?: Yes Admission Status: Voluntary Is patient  capable of signing voluntary admission?: Yes Transfer from: Acute Hospital Referral Source: MD  Education Status Is patient currently in school?: No  Risk to self Suicidal Ideation: Yes-Currently Present Suicidal Intent: Yes-Currently Present Is patient at risk for suicide?: Yes Suicidal Plan?: Yes-Currently Present Specify Current Suicidal Plan: hang self - have tried it before per pt Access to Means: Yes Specify Access to Suicidal Means: has rope, tree or options, past experience What has been  your use of drugs/alcohol within the last 12 months?: yes Previous Attempts/Gestures: Yes How many times?: 3 Other Self Harm Risks: none Triggers for Past Attempts: Unpredictable Intentional Self Injurious Behavior: None Family Suicide History: No Recent stressful life event(s): Job Loss;Financial Problems (sa use) Persecutory voices/beliefs?: Yes Depression: Yes Depression Symptoms: Tearfulness;Isolating;Fatigue;Guilt;Loss of interest in usual pleasures;Feeling worthless/self pity;Feeling angry/irritable Substance abuse history and/or treatment for substance abuse?: Yes Suicide prevention information given to non-admitted patients: Not applicable  Risk to Others Homicidal Ideation: No Thoughts of Harm to Others: No Current Homicidal Intent: No Current Homicidal Plan: No Access to Homicidal Means: No Identified Victim: na History of harm to others?: No Assessment of Violence: None Noted Violent Behavior Description: cooperative Does patient have access to weapons?: No Criminal Charges Pending?: No Does patient have a court date: No  Psychosis Hallucinations: Auditory;With command ("I am a bad person") Delusions: None noted  Mental Status Report Appear/Hygiene: Disheveled Eye Contact: Fair Motor Activity: Unremarkable Speech: Logical/coherent Level of Consciousness: Alert Mood: Depressed;Anxious;Suspicious;Worthless, low self-esteem Affect: Anxious;Depressed Anxiety Level: Severe Thought Processes: Tangential;Coherent Judgement: Impaired Orientation: Person;Situation Obsessive Compulsive Thoughts/Behaviors: Moderate  Cognitive Functioning Concentration: Decreased Memory: Recent Intact;Remote Intact IQ: Average Insight: Poor Impulse Control: Poor Appetite: Fair Weight Loss: 0 Weight Gain: 0 Sleep: Decreased Total Hours of Sleep: 3 Vegetative Symptoms: None  ADLScreening [name] Assessment Services) Patient's cognitive ability adequate to safely complete daily  activities?: Yes Patient able to express need for assistance with ADLs?: Yes Independently performs ADLs?: Yes (appropriate for developmental age)  Abuse/Neglect [facility]) Physical Abuse: Denies Verbal Abuse: Denies Sexual Abuse: Denies  Prior Inpatient Therapy Prior Inpatient Therapy: Yes Prior Therapy Dates: 2012 Prior Therapy Facilty/Provider(s): [facility] Reason for Treatment: SI and SA  Prior Outpatient Therapy Prior Outpatient Therapy: No Prior Therapy Dates: na Prior Therapy Facilty/Provider(s): na Reason for Treatment: na  ADL Screening (condition at time of admission) Patient's cognitive ability adequate to safely complete daily activities?: Yes Patient able to express need for assistance with ADLs?: Yes Independently performs ADLs?: Yes (appropriate for developmental age) Weakness of Legs: None Weakness of Arms/Hands: None  Home Assistive Devices/Equipment Home Assistive Devices/Equipment: None  Therapy Consults (therapy consults require a physician order) PT Evaluation Needed: No OT Evalulation Needed: No SLP Evaluation Needed: No Abuse/Neglect Assessment (Assessment to be complete while patient is alone) Physical Abuse: Denies Verbal Abuse: Denies Sexual Abuse: Denies Exploitation of patient/patient's resources: Denies Self-Neglect: Denies Values / Beliefs Cultural Requests During Hospitalization: None Spiritual Requests During Hospitalization: None Consults Spiritual Care Consult Needed: No Social Work Consult Needed: No Merchant navy officer (For Healthcare) Advance Directive: Patient does not have advance directive Pre-existing out of facility DNR order (yellow form or pink MOST form): No Nutrition Screen- [facility] Patient's home diet: NPO Have you recently lost weight without trying?: No Have you been eating poorly because of a decreased appetite?: No Malnutrition Screening Tool Score: 0  Additional Information 1:1 In Past 12 Months?: No CIRT Risk:  No Elopement Risk: No Does patient have medical clearance?: Yes     Disposition:  Disposition Initial Assessment Completed for this Encounter: Yes Disposition of Patient: Inpatient treatment program Type of inpatient treatment program: Adult  On Site Evaluation by:   Reviewed with Physician:     Titus Mould, Eppie [name] 03/26/2012 2:38 PM

## 2012-03-26 NOTE — ED Provider Notes (Signed)
History     CSN: [id number]  Arrival date & time 03/26/12  5621   First MD Initiated Contact with Patient 03/26/12 1008      Chief Complaint  Patient presents with  . Medical Clearance    (Consider location/radiation/quality/duration/timing/severity/associated sxs/prior treatment) HPI Comments: Patient presents with complaint of hearing voices. He states he's heard the voices for about last to 3 weeks. At times the voices tell him to hurt himself. He has a history of prior alcohol and crack cocaine abuse however he recently started using again 3 days ago. He states his last alcohol intake was last night about midnight and his last crack cocaine use was yesterday. He denies any other drug use. He denies any physical complaints or recent illnesses. He does have a history of bipolar disorder and has had hallucinations in the past. He states he's been off his medicines for about 6 months.   Past Medical History  Diagnosis Date  . Depression   . Anxiety   . Bipolar 1 disorder   . No pertinent past medical history     Past Surgical History  Procedure Laterality Date  . No past surgeries      Family History  Problem Relation Age of Onset  . Hypertension Other   . Diabetes Other     History  Substance Use Topics  . Smoking status: Current Every Day Smoker -- 0.50 packs/day    Types: Cigarettes  . Smokeless tobacco: Not on file  . Alcohol Use: 7.2 oz/week    12 Cans of beer per week     Comment: heavy 12 pk/day      Review of Systems  Constitutional: Negative for fever, chills, diaphoresis and fatigue.  HENT: Negative for congestion, rhinorrhea and sneezing.   Eyes: Negative.   Respiratory: Negative for cough, chest tightness and shortness of breath.   Cardiovascular: Negative for chest pain and leg swelling.  Gastrointestinal: Negative for nausea, vomiting, abdominal pain, diarrhea and blood in stool.  Genitourinary: Negative for frequency, hematuria, flank pain and  difficulty urinating.  Musculoskeletal: Negative for back pain and arthralgias.  Skin: Negative for rash.  Neurological: Negative for dizziness, speech difficulty, weakness, numbness and headaches.  Psychiatric/Behavioral: Positive for suicidal ideas and hallucinations. The patient is nervous/anxious.     Allergies  Review of patient's allergies indicates no known allergies.  Home Medications  No current outpatient prescriptions on file.  BP 142/78  Pulse 94  Temp(Src) 98.3 F (36.8 C) (Oral)  Resp 18  SpO2 98%  Physical Exam  Constitutional: He is oriented to person, place, and time. He appears well-developed and well-nourished.  HENT:  Head: Normocephalic and atraumatic.  Eyes: Pupils are equal, round, and reactive to light.  Neck: Normal range of motion. Neck supple.  Cardiovascular: Normal rate, regular rhythm and normal heart sounds.   Pulmonary/Chest: Effort normal and breath sounds normal. No respiratory distress. He has no wheezes. He has no rales. He exhibits no tenderness.  Abdominal: Soft. Bowel sounds are normal. There is no tenderness. There is no rebound and no guarding.  Musculoskeletal: Normal range of motion. He exhibits no edema.  Lymphadenopathy:    He has no cervical adenopathy.  Neurological: He is alert and oriented to person, place, and time.  Skin: Skin is warm and dry. No rash noted.  Psychiatric: He has a normal mood and affect.    ED Course  Procedures (including critical care time)  Results for orders placed during the hospital encounter of  03/26/12  ACETAMINOPHEN LEVEL      Result Value Range   Acetaminophen (Tylenol), Serum <15.0  10 - 30 ug/mL  CBC      Result Value Range   WBC 9.0  4.0 - 10.5 K/uL   RBC 5.41  4.22 - 5.81 MIL/uL   Hemoglobin 16.2  13.0 - 17.0 g/dL   HCT 16.1  09.6 - 04.5 %   MCV 86.0  78.0 - 100.0 fL   MCH 29.9  26.0 - 34.0 pg   MCHC 34.8  30.0 - 36.0 g/dL   RDW 40.9  81.1 - 91.4 %   Platelets 330  150 - 400 K/uL   COMPREHENSIVE METABOLIC PANEL      Result Value Range   Sodium 135  135 - 145 mEq/L   Potassium 3.9  3.5 - 5.1 mEq/L   Chloride 97  96 - 112 mEq/L   CO2 27  19 - 32 mEq/L   Glucose, Bld 83  70 - 99 mg/dL   BUN 12  6 - 23 mg/dL   Creatinine, Ser 7.82  0.50 - 1.35 mg/dL   Calcium 9.7  8.4 - 95.6 mg/dL   Total Protein 7.8  6.0 - 8.3 g/dL   Albumin 4.1  3.5 - 5.2 g/dL   AST 32  0 - 37 U/L   ALT 28  0 - 53 U/L   Alkaline Phosphatase 72  39 - 117 U/L   Total Bilirubin 0.8  0.3 - 1.2 mg/dL   GFR calc non Af Amer >90  >90 mL/min   GFR calc Af Amer >90  >90 mL/min  ETHANOL      Result Value Range   Alcohol, Ethyl (B) 11  0 - 11 mg/dL  SALICYLATE LEVEL      Result Value Range   Salicylate Lvl <2.0 (*) 2.8 - 20.0 mg/dL  URINE RAPID DRUG SCREEN (HOSP PERFORMED)      Result Value Range   Opiates NONE DETECTED  NONE DETECTED   Cocaine POSITIVE (*) NONE DETECTED   Benzodiazepines NONE DETECTED  NONE DETECTED   Amphetamines NONE DETECTED  NONE DETECTED   Tetrahydrocannabinol NONE DETECTED  NONE DETECTED   Barbiturates NONE DETECTED  NONE DETECTED   No results found.    1. Hallucination   2. Substance abuse       MDM  Pt moved back to psych ed, will have ACT assess.        Rolan Bucco, MD 03/26/12 1110

## 2012-03-26 NOTE — BHH Counselor (Signed)
Franklin [name], assessment counselor or [facility], submitted Pt for admission to [facility]. Per Franklin [name], [facility] there are no beds available. Franklin Found, FNP reviewed clinical information and accepted Pt to the service of M. Akintayo, MD pending 400-hall bed availability.  Franklin [name], LPC, [facility] Assessment Counselor

## 2012-03-26 NOTE — ED Notes (Signed)
Pt states he has been hearing voices x 1 week telling him to hurt himself. States he has been sober for 10 months and started using crack and ETOH on Thursday.

## 2012-03-26 NOTE — ED Notes (Signed)
Up to the bathroom 

## 2012-03-26 NOTE — ED Notes (Signed)
Sandwich/soda given 

## 2012-03-26 NOTE — ED Notes (Signed)
Report given to Marcelino Duster, RN in psych ED.

## 2012-03-26 NOTE — ED Notes (Signed)
Patient withdrawn and with dull, flat affect. No inappropriate behaviors.

## 2012-03-27 NOTE — BHH Counselor (Signed)
Patient accepted to [facility] by Dr. Forrestine Him. The call report # is [phone].

## 2012-03-27 NOTE — ED Provider Notes (Addendum)
Filed Vitals:   03/27/12 0652  BP: 135/66  Pulse: 71  Temp: 98.2 F (36.8 C)  Resp: 18   Pt seen and assessed. Cooperative. Pending placement possibly at [facility].   Raeford Razor, MD 03/27/12 [phone]   1:37 PM Pt accepted at [facility]. He does not want to go there. He only wants to go to [facility] which currently does not have beds. He would rather be discharged. He is here voluntarily.   Raeford Razor, MD 03/27/12 [phone]

## 2012-03-27 NOTE — BHH Counselor (Signed)
Writer met with patient to inform him that he was accepted to [facility]. Patient stated, "I don't want to go anywhere but [facility]". Writer informed patient that [facility] did not have a bed available for him at this time. Patient stated, "I want to go home". Writer informed patient that this information would be relayed to the EDP-Dr. Juleen China. Writer informed Dr. Juleen China that patient was refusing to go to [facility] and would need to be made IVC or discharged home. Dr. Juleen China was ok with discharging patient home. Writer met with patient and he sts that he is under the care of [facility] and will follow up. Writer also provided patient with several additional referrals for [facility], therapist, support groups, etc.

## 2012-04-07 ENCOUNTER — Inpatient Hospital Stay (HOSPITAL_COMMUNITY)
Admission: AD | Admit: 2012-04-07 | Discharge: 2012-04-10 | DRG: 885 | Disposition: A | Discharge status: Home / Self Care | Payer: No Typology Code available for payment source | Source: Intra-hospital | Attending: Psychiatry | Admitting: Psychiatry

## 2012-04-07 ENCOUNTER — Emergency Department (HOSPITAL_COMMUNITY)
Admission: EM | Admit: 2012-04-07 | Discharge: 2012-04-07 | Disposition: A | Discharge status: Other Acute Inpatient Hospital | Payer: Self-pay | Attending: Emergency Medicine | Admitting: Emergency Medicine

## 2012-04-07 ENCOUNTER — Encounter (HOSPITAL_COMMUNITY): Payer: Self-pay | Admitting: *Deleted

## 2012-04-07 DIAGNOSIS — F1910 Other psychoactive substance abuse, uncomplicated: Secondary | ICD-10-CM

## 2012-04-07 DIAGNOSIS — R45851 Suicidal ideations: Secondary | ICD-10-CM

## 2012-04-07 DIAGNOSIS — Z0289 Encounter for other administrative examinations: Secondary | ICD-10-CM | POA: Insufficient documentation

## 2012-04-07 DIAGNOSIS — F17200 Nicotine dependence, unspecified, uncomplicated: Secondary | ICD-10-CM | POA: Insufficient documentation

## 2012-04-07 DIAGNOSIS — R443 Hallucinations, unspecified: Secondary | ICD-10-CM | POA: Insufficient documentation

## 2012-04-07 DIAGNOSIS — F319 Bipolar disorder, unspecified: Secondary | ICD-10-CM | POA: Insufficient documentation

## 2012-04-07 DIAGNOSIS — F1010 Alcohol abuse, uncomplicated: Secondary | ICD-10-CM | POA: Insufficient documentation

## 2012-04-07 DIAGNOSIS — F1410 Cocaine abuse, uncomplicated: Secondary | ICD-10-CM | POA: Insufficient documentation

## 2012-04-07 DIAGNOSIS — F3130 Bipolar disorder, current episode depressed, mild or moderate severity, unspecified: Principal | ICD-10-CM | POA: Diagnosis present

## 2012-04-07 LAB — COMPREHENSIVE METABOLIC PANEL
ALT: 19 U/L (ref 0–53)
AST: 21 U/L (ref 0–37)
Albumin: 4.1 g/dL (ref 3.5–5.2)
Alkaline Phosphatase: 60 U/L (ref 39–117)
BUN: 9 mg/dL (ref 6–23)
CO2: 27 mEq/L (ref 19–32)
Calcium: 9.5 mg/dL (ref 8.4–10.5)
Chloride: 101 mEq/L (ref 96–112)
Creatinine, Ser: 0.87 mg/dL (ref 0.50–1.35)
GFR calc Af Amer: >90 mL/min (ref >90)
GFR calc non Af Amer: >90 mL/min (ref >90)
Glucose, Bld: 80 mg/dL (ref 70–99)
Potassium: 3.9 mEq/L (ref 3.5–5.1)
Sodium: 139 mEq/L (ref 135–145)
Total Bilirubin: 0.8 mg/dL (ref 0.3–1.2)
Total Protein: 7.5 g/dL (ref 6.0–8.3)

## 2012-04-07 LAB — CBC WITH DIFFERENTIAL/PLATELET
Basophils Absolute: 0.1 10*3/uL (ref 0.0–0.1)
Basophils Relative: 2 % — ABNORMAL HIGH (ref 0–1)
Eosinophils Absolute: 0.5 10*3/uL (ref 0.0–0.7)
Eosinophils Relative: 8 % — ABNORMAL HIGH (ref 0–5)
HCT: 43.5 % (ref 39.0–52.0)
Hemoglobin: 15 g/dL (ref 13.0–17.0)
Lymphocytes Relative: 39 % (ref 12–46)
Lymphs Abs: 2.6 10*3/uL (ref 0.7–4.0)
MCH: 29.7 pg (ref 26.0–34.0)
MCHC: 34.5 g/dL (ref 30.0–36.0)
MCV: 86.1 fL (ref 78.0–100.0)
Monocytes Absolute: 0.4 10*3/uL (ref 0.1–1.0)
Monocytes Relative: 6 % (ref 3–12)
Neutro Abs: 3 10*3/uL (ref 1.7–7.7)
Neutrophils Relative %: 45 % (ref 43–77)
Platelets: 360 10*3/uL (ref 150–400)
RBC: 5.05 MIL/uL (ref 4.22–5.81)
RDW: 14 % (ref 11.5–15.5)
WBC: 6.6 10*3/uL (ref 4.0–10.5)

## 2012-04-07 LAB — RAPID URINE DRUG SCREEN, HOSP PERFORMED
Amphetamines: NOT DETECTED
Barbiturates: NOT DETECTED
Benzodiazepines: NOT DETECTED
Cocaine: POSITIVE — AB
Opiates: NOT DETECTED
Tetrahydrocannabinol: POSITIVE — AB

## 2012-04-07 LAB — ETHANOL: Alcohol, Ethyl (B): 50 mg/dL — ABNORMAL HIGH (ref 0–11)

## 2012-04-07 MED ORDER — ADULT MULTIVITAMIN W/MINERALS CH
1.0000 | ORAL_TABLET | Freq: Every day | ORAL | Status: DC
  Administered 2012-04-07: 1 via ORAL
  Filled 2012-04-07: qty 1

## 2012-04-07 MED ORDER — CHLORDIAZEPOXIDE HCL 25 MG PO CAPS
25.0000 mg | ORAL_CAPSULE | Freq: Four times a day (QID) | ORAL | Status: DC | PRN
  Administered 2012-04-08: 25 mg via ORAL
  Filled 2012-04-07: qty 1

## 2012-04-07 MED ORDER — ONDANSETRON HCL 4 MG PO TABS
4.0000 mg | ORAL_TABLET | Freq: Three times a day (TID) | ORAL | Status: DC | PRN
  Administered 2012-04-07: 4 mg via ORAL
  Filled 2012-04-07: qty 1

## 2012-04-07 MED ORDER — LORAZEPAM 1 MG PO TABS
0.0000 mg | ORAL_TABLET | Freq: Four times a day (QID) | ORAL | Status: DC
  Administered 2012-04-07 (×2): 1 mg via ORAL
  Filled 2012-04-07 (×2): qty 1

## 2012-04-07 MED ORDER — MAGNESIUM HYDROXIDE 400 MG/5ML PO SUSP: 30.0000 mL | Freq: Every day | ORAL | Status: DC | PRN

## 2012-04-07 MED ORDER — LOPERAMIDE HCL 2 MG PO CAPS: 2.0000 mg | ORAL_CAPSULE | ORAL | Status: DC | PRN

## 2012-04-07 MED ORDER — ALUM & MAG HYDROXIDE-SIMETH 200-200-20 MG/5ML PO SUSP: 30.0000 mL | ORAL | Status: DC | PRN

## 2012-04-07 MED ORDER — THIAMINE HCL 100 MG/ML IJ SOLN
100.0000 mg | Freq: Once | INTRAMUSCULAR | Status: AC
  Administered 2012-04-07: 100 mg via INTRAMUSCULAR

## 2012-04-07 MED ORDER — ADULT MULTIVITAMIN W/MINERALS CH
1.0000 | ORAL_TABLET | Freq: Every day | ORAL | Status: DC
  Administered 2012-04-08 – 2012-04-09 (×2): 1 via ORAL
  Filled 2012-04-07 (×4): qty 1

## 2012-04-07 MED ORDER — ACETAMINOPHEN 325 MG PO TABS: 650.0000 mg | ORAL_TABLET | ORAL | Status: DC | PRN

## 2012-04-07 MED ORDER — HYDROXYZINE HCL 25 MG PO TABS
25.0000 mg | ORAL_TABLET | Freq: Four times a day (QID) | ORAL | Status: DC | PRN
  Administered 2012-04-08 – 2012-04-09 (×2): 25 mg via ORAL
  Filled 2012-04-07: qty 1

## 2012-04-07 MED ORDER — THIAMINE HCL 100 MG/ML IJ SOLN: 100.0000 mg | Freq: Every day | INTRAMUSCULAR | Status: DC

## 2012-04-07 MED ORDER — VITAMIN B-1 100 MG PO TABS
100.0000 mg | ORAL_TABLET | Freq: Every day | ORAL | Status: DC
  Administered 2012-04-08 – 2012-04-09 (×2): 100 mg via ORAL
  Filled 2012-04-07 (×4): qty 1

## 2012-04-07 MED ORDER — ACETAMINOPHEN 325 MG PO TABS: 650.0000 mg | ORAL_TABLET | Freq: Four times a day (QID) | ORAL | Status: DC | PRN

## 2012-04-07 MED ORDER — CHLORDIAZEPOXIDE HCL 25 MG PO CAPS: 25.0000 mg | ORAL_CAPSULE | Freq: Every day | ORAL | Status: DC

## 2012-04-07 MED ORDER — VITAMIN B-1 100 MG PO TABS
100.0000 mg | ORAL_TABLET | Freq: Every day | ORAL | Status: DC
  Administered 2012-04-07: 100 mg via ORAL
  Filled 2012-04-07: qty 1

## 2012-04-07 MED ORDER — ONDANSETRON 4 MG PO TBDP: 4.0000 mg | ORAL_TABLET | Freq: Four times a day (QID) | ORAL | Status: DC | PRN

## 2012-04-07 MED ORDER — CHLORDIAZEPOXIDE HCL 25 MG PO CAPS
25.0000 mg | ORAL_CAPSULE | ORAL | Status: DC
  Filled 2012-04-07: qty 1

## 2012-04-07 MED ORDER — LORAZEPAM 1 MG PO TABS: 0.0000 mg | ORAL_TABLET | Freq: Two times a day (BID) | ORAL | Status: DC

## 2012-04-07 MED ORDER — FOLIC ACID 1 MG PO TABS
1.0000 mg | ORAL_TABLET | Freq: Every day | ORAL | Status: DC
  Administered 2012-04-07: 1 mg via ORAL
  Filled 2012-04-07: qty 1

## 2012-04-07 MED ORDER — CHLORDIAZEPOXIDE HCL 25 MG PO CAPS
25.0000 mg | ORAL_CAPSULE | Freq: Three times a day (TID) | ORAL | Status: AC
  Administered 2012-04-09 (×3): 25 mg via ORAL
  Filled 2012-04-07 (×3): qty 1

## 2012-04-07 MED ORDER — CHLORDIAZEPOXIDE HCL 25 MG PO CAPS
25.0000 mg | ORAL_CAPSULE | Freq: Four times a day (QID) | ORAL | Status: AC
  Administered 2012-04-07 – 2012-04-08 (×4): 25 mg via ORAL
  Filled 2012-04-07 (×3): qty 1

## 2012-04-07 MED ORDER — IBUPROFEN 600 MG PO TABS: 600.0000 mg | ORAL_TABLET | Freq: Three times a day (TID) | ORAL | Status: DC | PRN

## 2012-04-07 MED ORDER — CHLORDIAZEPOXIDE HCL 25 MG PO CAPS
25.0000 mg | ORAL_CAPSULE | Freq: Once | ORAL | Status: AC
  Administered 2012-04-07: 25 mg via ORAL
  Filled 2012-04-07: qty 1

## 2012-04-07 NOTE — ED Notes (Signed)
Patient has 1 bag of belongings at nurses station.

## 2012-04-07 NOTE — ED Provider Notes (Signed)
History     CSN: [id number]  Arrival date & time 04/07/12  0816   First MD Initiated Contact with Patient 04/07/12 [phone]      Chief Complaint  Patient presents with  . Medical Clearance    (Consider location/radiation/quality/duration/timing/severity/associated sxs/prior treatment) HPI Patient is requesting detox off of alcohol and cocaine. He states he drinks about a fifth of liquor and a 12 pack a day. He states he does about $50 of crack 3 times a week. He last used yesterday. He also is having hallucinations. He states the voices sometimes telling him to kill himself. He states he is mildly suicidal. He was in the ER a week or 2 ago and was [facility] however he stated he did not want to go anywhere except behavioral health. Patient now states he feels he needs help and will go wherever he needs to go. He denies other complaints.   Past Medical History  Diagnosis Date  . Depression   . Anxiety   . Bipolar 1 disorder   . No pertinent past medical history     Past Surgical History  Procedure Laterality Date  . No past surgeries      Family History  Problem Relation Age of Onset  . Hypertension Other   . Diabetes Other     History  Substance Use Topics  . Smoking status: Current Every Day Smoker -- 0.50 packs/day    Types: Cigarettes  . Smokeless tobacco: Never Used  . Alcohol Use: 7.2 oz/week    12 Cans of beer per week     Comment: heavy 12 pk/day      Review of Systems  Constitutional: Negative for activity change and appetite change.  HENT: Negative for neck stiffness.   Eyes: Negative for pain.  Respiratory: Negative for chest tightness and shortness of breath.   Cardiovascular: Negative for chest pain and leg swelling.  Gastrointestinal: Negative for nausea, vomiting, abdominal pain and diarrhea.  Genitourinary: Negative for flank pain.  Musculoskeletal: Negative for back pain.  Skin: Negative for rash.  Neurological: Negative for  weakness, numbness and headaches.  Psychiatric/Behavioral: Positive for suicidal ideas and hallucinations. Negative for behavioral problems.    Allergies  Review of patient's allergies indicates no known allergies.  Home Medications  No current outpatient prescriptions on file.  BP 114/57  Pulse 92  Temp(Src) 98.5 F (36.9 C) (Oral)  Resp 16  SpO2 95%  Physical Exam  Nursing note and vitals reviewed. Constitutional: He is oriented to person, place, and time. He appears well-developed and well-nourished.  HENT:  Head: Normocephalic and atraumatic.  Eyes: EOM are normal. Pupils are equal, round, and reactive to light.  Neck: Normal range of motion. Neck supple.  Cardiovascular: Normal rate, regular rhythm and normal heart sounds.   No murmur heard. Pulmonary/Chest: Effort normal and breath sounds normal.  Abdominal: Soft. Bowel sounds are normal. He exhibits no distension and no mass. There is no tenderness. There is no rebound and no guarding.  Musculoskeletal: Normal range of motion. He exhibits no edema.  Neurological: He is alert and oriented to person, place, and time. No cranial nerve deficit.  Skin: Skin is warm and dry.  Psychiatric: He has a normal mood and affect.    ED Course  Procedures (including critical care time)  Labs Reviewed  CBC WITH DIFFERENTIAL - Abnormal; Notable for the following:    Eosinophils Relative 8 (*)    Basophils Relative 2 (*)    All  other components within normal limits  URINE RAPID DRUG SCREEN (HOSP PERFORMED) - Abnormal; Notable for the following:    Cocaine POSITIVE (*)    Tetrahydrocannabinol POSITIVE (*)    All other components within normal limits  ETHANOL - Abnormal; Notable for the following:    Alcohol, Ethyl (B) 50 (*)    All other components within normal limits  COMPREHENSIVE METABOLIC PANEL   No results found.   1. Substance abuse   2. Suicidal ideations       MDM  Patient presents with substance abuse and  suicidal ideations. He appears to medically cleared at this time and will be seen by ACT team.        Juliet Rude. Rubin Payor, MD 04/07/12 [phone]

## 2012-04-07 NOTE — ED Notes (Signed)
Patient in blue scrubs and red socks. Patient and belongings both wanded by security.  

## 2012-04-07 NOTE — BH Assessment (Signed)
Assessment Note   Franklin [name] is an 42 y.o. male who presents in the ED requesting detox from alcohol and crack. CSW met with pt at bedside to complete [facility] assessment. Pt reports Suicidal ideations. Pt reports always looking to see where he could hang himself from. Pt denies HI. Pt reports auditory hallucinations, voices telling him to kill himself. Patient denies any VH. Pt UDS positive for cocaine and thc. Pt BAL 50, with last drink last night in which he drank 1/5 of liquor.   Pt reports that he has been drinking a case and 1/5 daily for the past 2/3 weeks. Patient reports he also has been using $50 worth of cocaine 2 or 3 times per week. Pt also reprots occasional marijuana use.   Pt has history of bipolar disorder and states, "I'm supposed to be on meds but I dont knw what they are." Pt reports he hasn't seen a psychiatrist in over a year. Pt reports [facility] to [facility] int he past. Pt states, he doesn't know why he just stopped going and taking his meds.   Pt reports sympstoms of depression including: despondent, increased sleep (14 hrs per day or more), fluctuating appetite, feelings of worthlessness, feeling increased anger and irritability, guilt, and isolating self.   Axis I: Bipolar, Depressed, with psychotic features , poly substance abuse Axis II: Deferred Axis III:  Past Medical History  Diagnosis Date  . Depression   . Anxiety   . Bipolar 1 disorder   . No pertinent past medical history    Axis IV: other psychosocial or environmental problems, problems related to social environment, problems with access to health care services and problems with primary support group Axis V: 21-30 behavior considerably influenced by delusions or hallucinations OR serious impairment in judgment, communication OR inability to function in almost all areas  Past Medical History:  Past Medical History  Diagnosis Date  . Depression   . Anxiety   . Bipolar 1 disorder   . No pertinent past medical  history     Past Surgical History  Procedure Laterality Date  . No past surgeries      Family History:  Family History  Problem Relation Age of Onset  . Hypertension Other   . Diabetes Other     Social History:  reports that he has been smoking Cigarettes.  He has been smoking about 0.50 packs per day. He has never used smokeless tobacco. He reports that he drinks about 7.2 ounces of alcohol per week. He reports that he uses illicit drugs (Cocaine and Marijuana).  Additional Social History:  Alcohol / Drug Use History of alcohol / drug use?: Yes Substance #1 Name of Substance 1: Etoh  1 - Age of First Use: 17 1 - Amount (size/oz): case and 1/5  1 - Frequency: daily  1 - Duration: months 1 - Last Use / Amount: last night 1/5 of liquor  Substance #2 Name of Substance 2: thc  2 - Age of First Use: 17  2 - Amount (size/oz): unknown 2 - Frequency: 2/3 months  2 - Duration: years 2 - Last Use / Amount: 2 weeks Substance #3 Name of Substance 3: crack cocaine 3 - Age of First Use: 17 3 - Amount (size/oz): $50  3 - Frequency: 2/3 per week  3 - Duration: years 3 - Last Use / Amount: last night   CIWA: CIWA-Ar BP: 114/57 mmHg Pulse Rate: 92 Nausea and Vomiting: 2 Tactile Disturbances: none Tremor: not visible, but  can be felt fingertip to fingertip Auditory Disturbances: not present Paroxysmal Sweats: no sweat visible Visual Disturbances: not present Anxiety: mildly anxious Headache, Fullness in Head: very mild Agitation: normal activity Orientation and Clouding of Sensorium: oriented and can do serial additions CIWA-Ar Total: 5 COWS:    Allergies: No Known Allergies  Home Medications:  (Not in a hospital admission)  OB/GYN Status:  No LMP for male patient.  General Assessment Data Location of Assessment: [facility] Living Arrangements: Parent Can pt return to current living arrangement?: Yes Admission Status: Voluntary Is patient capable of signing voluntary  admission?: Yes Transfer from: Home Referral Source: Self/Family/Friend  Education Status Is patient currently in school?: No  Risk to self Suicidal Ideation: Yes-Currently Present Suicidal Intent: Yes-Currently Present Is patient at risk for suicide?: Yes Suicidal Plan?: Yes-Currently Present Specify Current Suicidal Plan: hang self  Access to Means: Yes Specify Access to Suicidal Means: rope  What has been your use of drugs/alcohol within the last 12 months?: yes Previous Attempts/Gestures: Yes How many times?: 1 Other Self Harm Risks: none Triggers for Past Attempts: Unpredictable Intentional Self Injurious Behavior: None Family Suicide History: No Recent stressful life event(s): Other (Comment) (pt states IDK) Persecutory voices/beliefs?: No Depression: Yes Depression Symptoms: Despondent;Isolating;Guilt;Loss of interest in usual pleasures;Feeling worthless/self pity;Feeling angry/irritable Substance abuse history and/or treatment for substance abuse?: Yes  Risk to Others Homicidal Ideation: No Thoughts of Harm to Others: No Current Homicidal Intent: No Current Homicidal Plan: No Access to Homicidal Means: No Identified Victim: n/a  History of harm to others?: No Assessment of Violence: None Noted Violent Behavior Description: none Does patient have access to weapons?: No Criminal Charges Pending?: No Does patient have a court date: No  Psychosis Hallucinations: Auditory;With command Delusions: None noted  Mental Status Report Appear/Hygiene: Disheveled Eye Contact: Fair Motor Activity: Freedom of movement Speech: Logical/coherent Level of Consciousness: Alert Mood: Irritable Affect: Irritable Thought Processes: Coherent;Relevant Judgement: Impaired Orientation: Person;Place;Time;Situation Obsessive Compulsive Thoughts/Behaviors: None  Cognitive Functioning Concentration: Decreased Memory: Recent Intact;Remote Intact IQ: Average Insight:  Poor Impulse Control: Poor Appetite: Fair Sleep: Increased Total Hours of Sleep: 14 Vegetative Symptoms: None  ADLScreening [facility] Assessment Services) Patient's cognitive ability adequate to safely complete daily activities?: Yes Patient able to express need for assistance with ADLs?: Yes Independently performs ADLs?: Yes (appropriate for developmental age)  Abuse/Neglect [facility]) Physical Abuse: Denies Verbal Abuse: Denies Sexual Abuse: Denies  Prior Inpatient Therapy Prior Inpatient Therapy: Yes Prior Therapy Dates: 2012  [facility]Provider([facility] Reason for Treatment: Si and SA  Prior Outpatient Therapy Prior Outpatient Therapy: No Prior Therapy Facilty/Provider(s): [facility]  Reason for Treatment: bipolar depression  ADL Screening (condition at time of admission) Patient's cognitive ability adequate to safely complete daily activities?: Yes Patient able to express need for assistance with ADLs?: Yes Independently performs ADLs?: Yes (appropriate for developmental age)       Abuse/Neglect Assessment (Assessment to be complete while patient is alone) Physical Abuse: Denies Verbal Abuse: Denies Sexual Abuse: Denies Values / Beliefs Cultural Requests During Hospitalization: None Spiritual Requests During Hospitalization: None     Nutrition Screen- [facility] Patient's home diet: Regular  Additional Information 1:1 In Past 12 Months?: No CIRT Risk: No Elopement Risk: No Does patient have medical clearance?: No     Disposition:  Disposition Initial Assessment Completed for this Encounter: Yes Disposition of Patient: Inpatient treatment program Type of inpatient treatment program: Adult  On Site Evaluation by:   Reviewed with Physician:  Catha Gosselin A 04/07/2012 3:54 PM

## 2012-04-07 NOTE — ED Notes (Signed)
Pt from home with reports of SI as well as requesting detox from crack/cocaine and alcohol. Pt reports last use of alcohol and drugs was yesterday. Pt also endorses hearing voices but denies hallucinations.

## 2012-04-08 ENCOUNTER — Encounter (HOSPITAL_COMMUNITY): Payer: Self-pay | Admitting: Registered Nurse

## 2012-04-08 DIAGNOSIS — F1910 Other psychoactive substance abuse, uncomplicated: Secondary | ICD-10-CM | POA: Diagnosis present

## 2012-04-08 DIAGNOSIS — R45851 Suicidal ideations: Secondary | ICD-10-CM

## 2012-04-08 DIAGNOSIS — F3130 Bipolar disorder, current episode depressed, mild or moderate severity, unspecified: Secondary | ICD-10-CM | POA: Diagnosis present

## 2012-04-08 MED ORDER — TRAZODONE HCL 50 MG PO TABS
50.0000 mg | ORAL_TABLET | Freq: Every evening | ORAL | Status: DC | PRN
  Administered 2012-04-09: 50 mg via ORAL
  Filled 2012-04-08: qty 1

## 2012-04-08 NOTE — [facility] Suicide Risk Assessment (Signed)
Suicide Risk Assessment  Admission Assessment     Nursing information obtained from:  Patient Demographic factors:  Male;Unemployed;Access to firearms Current Mental Status:  Suicidal ideation indicated by patient Loss Factors:  Decrease in vocational status;Financial problems / change in socioeconomic status Historical Factors:  Prior suicide attempts;Family history of mental illness or substance abuse Risk Reduction Factors:  Living with another person, especially a relative  CLINICAL FACTORS:   Alcohol/Substance Abuse/Dependencies  COGNITIVE FEATURES THAT CONTRIBUTE TO RISK:  Closed-mindedness    SUICIDE RISK:   Moderate:  Frequent suicidal ideation with limited intensity, and duration, some specificity in terms of plans, no associated intent, good self-control, limited dysphoria/symptomatology, some risk factors present, and identifiable protective factors, including available and accessible social support.  PLAN OF CARE:  Continue detox  I certify that inpatient services furnished can reasonably be expected to improve the patient's condition.  Wonda Cerise 04/08/2012, 3:43 PM
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
## 2012-04-08 NOTE — Progress Notes (Signed)
D.  Pt pleasant on approach, states that he is experiencing some anxiety related to withdrawal.  Positive for evening group, interacting appropriately within milieu.  Denies SI/HI/hallucinations at this time.  A.  Support and encouragement offered, medication given as ordered for withdrawal  R.  Pt remains safe on unit, will continue to monitor.

## 2012-04-08 NOTE — [facility] Suicide Risk Assessment (Deleted)
Suicide Risk Assessment  Admission Assessment     Nursing information obtained from:  Patient Demographic factors:  Male;Unemployed;Access to firearms Current Mental Status:  Suicidal ideation indicated by patient Loss Factors:  Decrease in vocational status;Financial problems / change in socioeconomic status Historical Factors:  Prior suicide attempts;Family history of mental illness or substance abuse Risk Reduction Factors:  Living with another person, especially a relative  CLINICAL FACTORS:   Alcohol/Substance Abuse/Dependencies  COGNITIVE FEATURES THAT CONTRIBUTE TO RISK:  Closed-mindedness    SUICIDE RISK:   Moderate:  Frequent suicidal ideation with limited intensity, and duration, some specificity in terms of plans, no associated intent, good self-control, limited dysphoria/symptomatology, some risk factors present, and identifiable protective factors, including available and accessible social support.  PLAN OF CARE:   Continue detox. Will consider other if needed later  I certify that inpatient services furnished can reasonably be expected to improve the patient's condition.  Wonda Cerise 04/08/2012, 5:44 PM

## 2012-04-08 NOTE — [facility] Group Notes (Signed)
[facility] LCSW Group Therapy  04/08/2012  11:15 AM  Did not attend  Sarina Ser 04/08/2012, 12:14 PM

## 2012-04-08 NOTE — Progress Notes (Signed)
Patient ID: Franklin [name], male   DOB: [birth year], 42 y.o.   MRN: [id number] This is a 42 y.o. S/AA/M with a Dx of Bipolar D/O and Polysubstance Abuse. The patient reports that he stopped taking his medications about 6 months ago. He has auditory hallucinations and self medicates with alcohol, drinking about 1/5 th of alcohol per day or a 12 pack of beer. He reports that he also does about $50 of crack cocaine weekly and occasionally THC. Admits that the voices usually get worse with the drugs and wants to detox to get clean and sober.  States that he is depressed and feeling suicidal. Prior h/o of hanging himself in a suicide attempt. He is able to verbally contract for safety at this time. Denies any significant medical history. Fall risk plan reviewed with the patient.

## 2012-04-08 NOTE — H&P (Signed)
  Pt was seen by me today and I agree with the key elements documented in H&P.  

## 2012-04-08 NOTE — Progress Notes (Signed)
D    Pt has been cooperative but irritable and demanding   He is requesting to be moved to the 300 hall for treatment of his substance abuse issues    And has been determined to be more appropriate for 300 hall programing and placement in room   He has been in his room in bed most of the day and has very little interaction with others   He has suicidal ideation but contracts for safety A   Verbal support given  Medications administered and effectiveness monitored  Q 15 min checks  Pt moved to 300 hall for more effective treatment  R   Pt safe at present

## 2012-04-08 NOTE — Tx Team (Signed)
Initial Interdisciplinary Treatment Plan  PATIENT STRENGTHS: (choose at least two) Average or above average intelligence General fund of knowledge Physical Health Supportive family/friends  PATIENT STRESSORS: Financial difficulties Medication change or noncompliance Substance abuse   PROBLEM LIST: Problem List/Patient Goals Date to be addressed Date deferred Reason deferred Estimated date of resolution  Substance abuse 04/07/12     Depression with suicidal ideation 04/07/12     Auditory hallucinations 04/07/12                                          DISCHARGE CRITERIA:  Improved stabilization in mood, thinking, and/or behavior Need for constant or close observation no longer present Verbal commitment to aftercare and medication compliance Withdrawal symptoms are absent or subacute and managed without 24-hour nursing intervention  PRELIMINARY DISCHARGE PLAN: Attend 12-step recovery group Outpatient therapy Return to previous living arrangement  PATIENT/FAMIILY INVOLVEMENT: This treatment plan has been presented to and reviewed with the patient, Franklin [name], Franklin [name] 04/08/2012, 12:38 AM

## 2012-04-09 DIAGNOSIS — F1910 Other psychoactive substance abuse, uncomplicated: Secondary | ICD-10-CM

## 2012-04-09 DIAGNOSIS — F3130 Bipolar disorder, current episode depressed, mild or moderate severity, unspecified: Principal | ICD-10-CM

## 2012-04-09 MED ORDER — BENZTROPINE MESYLATE 0.5 MG PO TABS: 0.5000 mg | ORAL_TABLET | Freq: Two times a day (BID) | ORAL | Status: DC | PRN

## 2012-04-09 MED ORDER — FLUOXETINE HCL 20 MG PO CAPS
20.0000 mg | ORAL_CAPSULE | Freq: Every day | ORAL | Status: DC
  Filled 2012-04-09 (×5): qty 1

## 2012-04-09 MED ORDER — HALOPERIDOL 1 MG PO TABS
1.0000 mg | ORAL_TABLET | Freq: Every day | ORAL | Status: DC
  Filled 2012-04-09 (×3): qty 1

## 2012-04-09 NOTE — Progress Notes (Signed)
[facility] Group Notes:  (Nursing/MHT/Case Management/Adjunct)  Date:  04/08/2012  Time:  2100  Type of Therapy:  wrap up group  Participation Level:  Minimal  Participation Quality:  Resistant  Affect:  Flat  Cognitive:  Appropriate  Insight:  Appropriate  Engagement in Group:  Lacking  Modes of Intervention:  Clarification, Education and Support  Summary of Progress/Problems: Pt entered towards the end of group.  Pt did answer this writers questions but not forthcoming. Group encouraged pt to speak and pt gave short nondescript answers. Pt stated "yall ask too many questions".  Shelah Lewandowsky 04/09/2012, 3:52 AM

## 2012-04-09 NOTE — Progress Notes (Signed)
D.  Pt anxious about medication change, refused to take Prozac and Haldol that was scheduled for tonight.  PT states that no one discussed this with him and he will speak to the doctor before taking anything new.  Pt states that he is not experiencing any signs or symptoms of withdrawal and that he would like to be discharged as soon as possible.  Denies SI/HI/hallucinations at this time, although it was reported early that he had been having auditory hallucinations.  Pt did not attend evening AA group.  A.  Support and encouragement offered  R.  Pt remains safe on unit, will continue to monitor.

## 2012-04-09 NOTE — Progress Notes (Signed)
[facility] MD Progress Note  04/09/2012 2:55 PM Franklin [name]  MRN:  [id number]  Subjective:  "I don't feel good. I'm detoxing from alcohol and crack. I have headaches, stomach cramps and I'm depressed too. I need depression medicine".  Diagnosis:   Axis I: Bipolar I disorder, most recent episode (or current) depressed, unspecified, Polysubstance absue Axis II: Deferred Axis III:  Past Medical History  Diagnosis Date  . Depression   . Anxiety   . Bipolar 1 disorder   . No pertinent past medical history    Axis IV: other psychosocial or environmental problems Axis V: 41-50 serious symptoms  ADL's:  Intact  Sleep: Good  Appetite:  Good  Suicidal Ideation:  Yes, without plans and intent Homicidal Ideation:  NA  AEB (as evidenced by):  Psychiatric Specialty Exam: Review of Systems  Constitutional: Negative.   HENT: Negative.   Eyes: Negative.   Respiratory: Negative.   Cardiovascular: Negative.   Gastrointestinal: Negative.   Genitourinary: Negative.   Musculoskeletal: Negative.   Skin: Negative.   Neurological: Negative.   Endo/Heme/Allergies: Negative.   Psychiatric/Behavioral: Positive for depression, suicidal ideas and substance abuse. Negative for hallucinations and memory loss. The patient has insomnia. The patient is not nervous/anxious.     Blood pressure 136/75, pulse 80, temperature 98 F (36.7 C), temperature source Oral, resp. rate 20, height 5\' 10"  (1.778 m), weight 79.379 kg (175 lb).Body mass index is 25.11 kg/(m^2).  General Appearance: Casual  Eye Contact::  Good  Speech:  Clear and Coherent  Volume:  Normal  Mood:  Depressed  Affect:  Flat  Thought Process:  Coherent and Intact  Orientation:  Full (Time, Place, and Person)  Thought Content:  Hallucinations: Auditory and Rumination  Suicidal Thoughts:  Yes.  without intent/plan  Homicidal Thoughts:  No  Memory:  Immediate;   Good Recent;   Good Remote;   Good  Judgement:  Fair  Insight:  Fair   Psychomotor Activity:  Normal  Concentration:  Good  Recall:  Good  Akathisia:  No  Handed:  Right  AIMS (if indicated):     Assets:  Desire for Improvement  Sleep:  Number of Hours: 6   Current Medications: Current Facility-Administered Medications  Medication Dose Route Frequency Provider Last Rate Last Dose  . acetaminophen (TYLENOL) tablet 650 mg  650 mg Oral Q6H PRN Shuvon Rankin, NP      . alum & mag hydroxide-simeth (MAALOX/MYLANTA) 200-200-20 MG/5ML suspension 30 mL  30 mL Oral Q4H PRN Shuvon Rankin, NP      . chlordiazePOXIDE (LIBRIUM) capsule 25 mg  25 mg Oral Q6H PRN Shuvon Rankin, NP   25 mg at 04/08/12 2118  . chlordiazePOXIDE (LIBRIUM) capsule 25 mg  25 mg Oral TID Shuvon Rankin, NP   25 mg at 04/09/12 1412   Followed by  . [START ON 04/10/2012] chlordiazePOXIDE (LIBRIUM) capsule 25 mg  25 mg Oral BH-qamhs Shuvon Rankin, NP       Followed by  . [START ON 04/11/2012] chlordiazePOXIDE (LIBRIUM) capsule 25 mg  25 mg Oral Daily Shuvon Rankin, NP      . hydrOXYzine (ATARAX/VISTARIL) tablet 25 mg  25 mg Oral Q6H PRN Shuvon Rankin, NP   25 mg at 04/08/12 1204  . loperamide (IMODIUM) capsule 2-4 mg  2-4 mg Oral PRN Shuvon Rankin, NP      . magnesium hydroxide (MILK OF MAGNESIA) suspension 30 mL  30 mL Oral Daily PRN Shuvon Rankin, NP      .  multivitamin with minerals tablet 1 tablet  1 tablet Oral Daily Shuvon Rankin, NP   1 tablet at 04/09/12 0730  . ondansetron (ZOFRAN-ODT) disintegrating tablet 4 mg  4 mg Oral Q6H PRN Shuvon Rankin, NP      . thiamine (VITAMIN B-1) tablet 100 mg  100 mg Oral Daily Shuvon Rankin, NP   100 mg at 04/09/12 0730  . traZODone (DESYREL) tablet 50 mg  50 mg Oral QHS PRN,MR X 1 [name], NP        Lab Results: No results found for this or any previous visit (from the past 48 hour(s)).  Physical Findings: AIMS: Facial and Oral Movements Muscles of Facial Expression: None, normal Lips and Perioral Area: None, normal Jaw: None, normal Tongue:  None, normal,Extremity Movements Upper (arms, wrists, hands, fingers): None, normal Lower (legs, knees, ankles, toes): None, normal, Trunk Movements Neck, shoulders, hips: None, normal, Overall Severity Severity of abnormal movements (highest score from questions above): None, normal Incapacitation due to abnormal movements: None, normal Patient's awareness of abnormal movements (rate only patient's report): No Awareness, Dental Status Current problems with teeth and/or dentures?: No Does patient usually wear dentures?: No  CIWA:  CIWA-Ar Total: 3 COWS:  COWS Total Score: 2  Treatment Plan Summary: Daily contact with patient to assess and evaluate symptoms and progress in treatment Medication management  Plan: Supportive approach/coping skills/relapse prevention. Will initiate Fluoxetine 10 mg daily for symptoms of depression. Add haldol 1 mg daily for hallucinations. Encouraged out of room, participation in group sessions and application of coping skills when distressed. Will continue to monitor response to/adverse effects of medications in use to assure effectiveness. Continue to monitor mood, behavior and interaction with staff and other patients. Continue current plan of care.  Medical Decision Making Problem Points:  Established problem, worsening (2) Data Points:  Review of medication regiment & side effects (2) Review of new medications or change in dosage (2)  I certify that inpatient services furnished can reasonably be expected to improve the patient's condition.   [name] I 04/09/2012, 2:55 PM

## 2012-04-09 NOTE — Progress Notes (Signed)
D Pt is seen out in the halls, pacing and talking about " when can I go home". HE does not want to attend his groups, he is not engaged in his recovery and he says he is " ready to go home".   A He refused 1 dose of librium today, but took the other 2 dose. He told the NP that he is hearing voices, but refused to take prn medication for this. HE will be started on prozac 20 mg today, per NP instruction.   R Safety is in place and POC contd.

## 2012-04-09 NOTE — [facility] Group Notes (Signed)
[facility] LCSW Group Therapy  04/09/2012 10am  Did not attend - came to group, but left before it started.  Sarina Ser 04/09/2012, 12:19 PM

## 2012-04-09 NOTE — Progress Notes (Signed)
Psychoeducational Group Note  Date:  04/09/2012 Time: 1015 Group Topic/Focus:  Making Healthy Choices:   The focus of this group is to help patients identify negative/unhealthy choices they were using prior to admission and identify positive/healthier coping strategies to replace them upon discharge.  Participation Level:  Active  Participation Quality:  Appropriate  Affect:  Depressed  Cognitive:  Appropriate  Insight:  Engaged  Engagement in Group:  Engaged  Additional Comments:    Rich Brave 4:14 PM. 04/09/2012

## 2012-04-09 NOTE — Progress Notes (Signed)
Thankfulness  Group Note  Date: 04/09/2012 Time: 0900  Group Topic/Focus:  Thankfulness Group :   The focus of this group is to help patients identify  Things/ people / events in their lives they are thankful for..  Participation Level:  Did Not Attend  PAdditional Comments:    04/09/2012,9:41 AM Rich Brave

## 2012-04-09 NOTE — Progress Notes (Signed)
Thankfulness  Group Note  Date:  04/09/2012 Time:  0900 Group Topic/Focus:  Identifying things to be thankful for :   The focus of this group is to help patients identify  People / things / events patients are thankful for..  Participation Level:  Did Not Attend   Additional Comments:    Rich Brave 9:39 AM. 04/09/2012

## 2012-04-10 MED ORDER — NICOTINE 21 MG/24HR TD PT24
21.0000 mg | MEDICATED_PATCH | Freq: Every day | TRANSDERMAL | Status: DC
  Filled 2012-04-10: qty 1

## 2012-04-10 NOTE — Tx Team (Signed)
Interdisciplinary Treatment Plan Update (Adult)  Date: 04/10/2012  Time Reviewed: 9:30 AM   Progress in Treatment: Attending groups: Yes Participating in groups: Yes Taking medication as prescribed:  Yes Tolerating medication:  Yes Family/Significant othe contact made: No Patient understands diagnosis: Yes Discussing patient identified problems/goals with staff: Yes Medical problems stabilized or resolved:  Yes Denies suicidal/homicidal ideation: Yes Patient has not harmed self or Others: Yes  New problem(s) identified: None Identified  Discharge Plan or Barriers:  Patient will return to halfway house in [place] and [employer]; has contacted sponsor whom he plans to see tonight  Additional comments: N/A  Reason for Continuation of Hospitalization: NA  Estimated length of stay: Discharging now  For review of initial/current patient goals, please see plan of care.  Attendees: Patient:     Family:     Physician:  Franklin [name] 04/10/2012 9:30 AM  Nursing:      Clinical Social Worker Franklin [name] 04/10/2012 9:30 AM               Scribe for Treatment Team:   Franklin [name], LCSWA  04/10/2012 9:30 AM

## 2012-04-10 NOTE — [facility] Suicide Risk Assessment (Signed)
[facility] INPATIENT:  Family/Significant Other Suicide Prevention Education  Suicide Prevention Education:  Patient Refusal for Family/Significant Other Suicide Prevention Education: The patient Franklin [name] has refused to provide written consent for family/significant other to be provided Family/Significant Other Suicide Prevention Education during admission and/or prior to discharge.  Physician notified.  Writer provided suicide prevention education directly to patient; conversation included risk factors, warning signs and resources to contact for help. Mobile crisis services explained and contact card placed in chart for pt to receive at discharge.  Clide Dales 04/10/2012, 9:48 AM

## 2012-04-10 NOTE — [facility] Group Notes (Signed)
[facility] LCSW Aftercare Discharge Planning Group Note   04/10/2012  8:45 AM  Participation Quality:  Appropriate  Mood/Affect:  Appropriate  Depression Rating:  2  Anxiety Rating:  2  Thoughts of Suicide: None  Will you contract for safety?   NA  Current AVH: Negative  Plan for Discharge/Comments:  Patient reports psychiatrist will be discharging him today as soon as possible. Patient\'s plan is to return to [place] via bus and return to [employer] he currently lives in. Patient refuses followup appointment.   Transportation Means: Bus voucher to [place] where patient will use his own funds to get to [place] via bus.   Supports: [employer], sponsor and AA friends in addition to family member  Franklin [name], Franklin [name]

## 2012-04-10 NOTE — Progress Notes (Signed)
Patient did not  attend the evening speaker AA meeting. Pt was notified that group was starting and pt remained in bed.

## 2012-04-10 NOTE — Discharge Summary (Signed)
Physician Discharge Summary Note  Patient:  Franklin [name] is an 42 y.o., male MRN:  [id number] DOB:  25-Jun-1970 Patient phone:  [phone] (home)  Patient address:   [address] [place] [address],   Date of Admission:  04/07/2012 Date of Discharge: 04/10/2012  Reason for Admission:  Depression with suicidal ideations, alcohol/cocaine detox  Discharge Diagnoses: Principal Problem:   Bipolar I disorder, most recent episode (or current) depressed, unspecified Active Problems:   Polysubstance abuse  Review of Systems  Constitutional: Negative.   HENT: Negative.   Eyes: Negative.   Respiratory: Negative.   Cardiovascular: Negative.   Gastrointestinal: Negative.   Genitourinary: Negative.   Musculoskeletal: Negative.   Skin: Negative.   Neurological: Negative.   Endo/Heme/Allergies: Negative.   Psychiatric/Behavioral: Positive for substance abuse.   Axis Diagnosis:   AXIS I:  Bipolar, Depressed and Substance Abuse AXIS II:  Deferred AXIS III:   Past Medical History  Diagnosis Date  . Depression   . Anxiety   . Bipolar 1 disorder   . No pertinent past medical history    AXIS IV:  other psychosocial or environmental problems, problems related to social environment and problems with primary support group AXIS V:  61-70 mild symptoms  Level of Care:  OP  Hospital Course:  On admission:  He presented in the ED requesting detox from alcohol and crack. CSW met with pt at bedside to complete [facility] assessment. Pt reports Suicidal ideations. Pt reports always looking to see where he could hang himself from. Pt denies HI. Pt reports auditory hallucinations, voices telling him to kill himself. Patient denies any VH. Pt UDS positive for cocaine and thc. Pt BAL 50, with last drink last night in which he drank 1/5 of liquor. Pt reports that he has been drinking a case and 1/5 daily for the past 2/3 weeks. Patient reports he also has been using $50 worth of cocaine 2 or 3 times per week. Pt  also reprots occasional marijuana use. Pt has history of bipolar disorder and states, "I'm supposed to be on meds but I dont knw what they are." Pt reports he hasn't seen a psychiatrist in over a year. Pt reports going to [facility] int he past. Pt states, he doesn't know why he just stopped going and taking his meds. Pt reports sympstoms of depression including: despondent, increased sleep (14 hrs per day or more), fluctuating appetite, feelings of worthlessness, feeling increased anger and irritability, guilt, and isolating self.   During hospitalization:  He was not engaged in groups, attended a couple of groups with minimal participation.  Franklin [name] refused most of his medications.  He remained irritable and demanding at times.  Franklin [name] is not vested for treatment and stated he feels his detox is completed and wants to return to his half-way house in [place] today, wants to be on the bus by 12 noon.  Patient denied suicidal/homicidal ideations and auditory/visual hallucinations, follow-up appointments encouraged to attend, outside support groups encouraged also.  He will return to his half-way house and continue his psychiatric care at [facility] in [place].  Franklin [name] is mentally and physically stable for discharge.  Consults:  None  Significant Diagnostic Studies:  labs: Completed and reviewed, stable  Discharge Vitals:   Blood pressure 124/53, pulse 108, temperature 97.7 F (36.5 C), temperature source Oral, resp. rate 20, height 5\' 10"  (1.778 m), weight 79.379 kg (175 lb). Body mass index is 25.11 kg/(m^2). Lab Results:   Results for orders placed during the hospital  encounter of 04/07/12 (from the past 72 hour(s))  CBC WITH DIFFERENTIAL     Status: Abnormal   Collection Time    04/07/12  8:51 AM      Result Value Range   WBC 6.6  4.0 - 10.5 K/uL   RBC 5.05  4.22 - 5.81 MIL/uL   Hemoglobin 15.0  13.0 - 17.0 g/dL   HCT 16.1  09.6 - 04.5 %   MCV 86.1  78.0 - 100.0 fL   MCH 29.7  26.0 - 34.0 pg    MCHC 34.5  30.0 - 36.0 g/dL   RDW 40.9  81.1 - 91.4 %   Platelets 360  150 - 400 K/uL   Neutrophils Relative 45  43 - 77 %   Neutro Abs 3.0  1.7 - 7.7 K/uL   Lymphocytes Relative 39  12 - 46 %   Lymphs Abs 2.6  0.7 - 4.0 K/uL   Monocytes Relative 6  3 - 12 %   Monocytes Absolute 0.4  0.1 - 1.0 K/uL   Eosinophils Relative 8 (*) 0 - 5 %   Eosinophils Absolute 0.5  0.0 - 0.7 K/uL   Basophils Relative 2 (*) 0 - 1 %   Basophils Absolute 0.1  0.0 - 0.1 K/uL  COMPREHENSIVE METABOLIC PANEL     Status: None   Collection Time    04/07/12  8:51 AM      Result Value Range   Sodium 139  135 - 145 mEq/L   Potassium 3.9  3.5 - 5.1 mEq/L   Chloride 101  96 - 112 mEq/L   CO2 27  19 - 32 mEq/L   Glucose, Bld 80  70 - 99 mg/dL   BUN 9  6 - 23 mg/dL   Creatinine, Ser 7.82  0.50 - 1.35 mg/dL   Calcium 9.5  8.4 - 95.6 mg/dL   Total Protein 7.5  6.0 - 8.3 g/dL   Albumin 4.1  3.5 - 5.2 g/dL   AST 21  0 - 37 U/L   ALT 19  0 - 53 U/L   Alkaline Phosphatase 60  39 - 117 U/L   Total Bilirubin 0.8  0.3 - 1.2 mg/dL   GFR calc non Af Amer >90  >90 mL/min   GFR calc Af Amer >90  >90 mL/min   Comment:            The eGFR has been calculated     using the CKD EPI equation.     This calculation has not been     validated in all clinical     situations.     eGFR's persistently     <90 mL/min signify     possible Chronic Kidney Disease.  ETHANOL     Status: Abnormal   Collection Time    04/07/12  8:51 AM      Result Value Range   Alcohol, Ethyl (B) 50 (*) 0 - 11 mg/dL   Comment:            LOWEST DETECTABLE LIMIT FOR     SERUM ALCOHOL IS 11 mg/dL     FOR MEDICAL PURPOSES ONLY  URINE RAPID DRUG SCREEN (HOSP PERFORMED)     Status: Abnormal   Collection Time    04/07/12  9:07 AM      Result Value Range   Opiates NONE DETECTED  NONE DETECTED   Cocaine POSITIVE (*) NONE DETECTED   Benzodiazepines NONE DETECTED  NONE DETECTED  Amphetamines NONE DETECTED  NONE DETECTED   Tetrahydrocannabinol POSITIVE  (*) NONE DETECTED   Barbiturates NONE DETECTED  NONE DETECTED   Comment:            DRUG SCREEN FOR MEDICAL PURPOSES     ONLY.  IF CONFIRMATION IS NEEDED     FOR ANY PURPOSE, NOTIFY LAB     WITHIN 5 DAYS.                LOWEST DETECTABLE LIMITS     FOR URINE DRUG SCREEN     Drug Class       Cutoff (ng/mL)     Amphetamine      1000     Barbiturate      200     Benzodiazepine   200     Tricyclics       300     Opiates          300     Cocaine          300     THC              50    Physical Findings: AIMS: Facial and Oral Movements Muscles of Facial Expression: None, normal Lips and Perioral Area: None, normal Jaw: None, normal Tongue: None, normal,Extremity Movements Upper (arms, wrists, hands, fingers): None, normal Lower (legs, knees, ankles, toes): None, normal, Trunk Movements Neck, shoulders, hips: None, normal, Overall Severity Severity of abnormal movements (highest score from questions above): None, normal Incapacitation due to abnormal movements: None, normal Patient's awareness of abnormal movements (rate only patient's report): No Awareness, Dental Status Current problems with teeth and/or dentures?: No Does patient usually wear dentures?: No  CIWA:  CIWA-Ar Total: 0 COWS:  COWS Total Score: 2  Psychiatric Specialty Exam: See Psychiatric Specialty Exam and Suicide Risk Assessment completed by Attending Physician prior to discharge.  Discharge destination:  Other:  Half-way house  Is patient on multiple antipsychotic therapies at discharge:  No   Has Patient had three or more failed trials of antipsychotic monotherapy by history:  No Recommended Plan for Multiple Antipsychotic Therapies:  N/A  Discharge Orders   Future Orders Complete By Expires     Activity as tolerated - No restrictions  As directed     Diet - low sodium heart healthy  As directed         Medication List     As of 04/10/2012  8:47 AM    Notice      You have not been prescribed any  medications.         Follow-up recommendations:  Activity:  As tolerated Diet:  Low-sodium heart healthy diet Continue to work a relapse prevention plan Comments:  Patient will return to his half-way house and continue his care at [facility].  Total Discharge Time:  Less than 30 minutes.  SignedNanine Means, PMH-NP 04/10/2012, 8:47 AM

## 2012-04-10 NOTE — Progress Notes (Signed)
Patient ID: Franklin [name], male   DOB: [birth year], 42 y.o.   MRN: [id number] He has been discharge and stated he was going to [place] to a shelter . He denies thoughts of SI and all his belonging were taken home with him.

## 2012-04-10 NOTE — Progress Notes (Signed)
Patient ID: Franklin [name], male   DOB: 1970-02-03, 42 y.o.   MRN: [id number] Patient's admit date was noted as  Friday 04/07/12.  It appears he came on unit at or soon after midnight.  Patient's PSA was not completed over weekend or today before discharge as CSW was informed of his discharge 30 minutes before he was discharged today.  Carney Bern, LCSWA  04/10/2012

## 2012-04-10 NOTE — [facility] Suicide Risk Assessment (Signed)
Suicide Risk Assessment  Discharge Assessment     Demographic Factors:  Male  Mental Status Per Nursing Assessment::   On Admission:  Suicidal ideation indicated by patient  Current Mental Status by Physician: In full contact with reality. There are no suicidal ideas, plans or intent. States he had maintained sobriety living at  a half way house in [place]. States he relapsed after going to court and his son's mother talking bad about him about how he was an addict, and that he was in out of the son's life. He was not granted visitation as he requested but the case will be seen again in six months. States that his symptoms are drug related. States that the 10 months he was sober his mood was stable. Does not want to take medications as says he does not need them and he has multiple side effects.He stated al his symptoms are secondary to his drug use. His mood is euthymic, his affect is appropriate. States he needs to leave this AM as he could loose his bed at the half way house if he is not there by 2 PM Claris Gower) Loss Factors: Loss of significant relationship  Historical Factors: NA  Risk Reduction Factors:   Sense of responsibility to family, Employed, Living with another person, especially a relative and Positive social support  Continued Clinical Symptoms:  Alcohol/Substance Abuse/Dependencies  Cognitive Features That Contribute To Risk: None identified   Suicide Risk:  Minimal: No identifiable suicidal ideation.  Patients presenting with no risk factors but with morbid ruminations; may be classified as minimal risk based on the severity of the depressive symptoms  Discharge Diagnoses:   AXIS I:  Alcohol, Cocaine Dependence, Marijuana Abuse, Substance Induced Mood Disorder AXIS II:  Deferred AXIS III:   Past Medical History  Diagnosis Date  . Depression   . Anxiety   . Bipolar 1 disorder   . No pertinent past medical history    AXIS IV:  problems with primary  support group AXIS V:  61-70 mild symptoms  Plan Of Care/Follow-up recommendations:  Activity:  As tolerated Diet:  regular Go back to the half way house, go to AA/stay in touch with sponsor Is patient on multiple antipsychotic therapies at discharge:  No   Has Patient had three or more failed trials of antipsychotic monotherapy by history:  No  Recommended Plan for Multiple Antipsychotic Therapies: N/A   Miyonna Ormiston A 04/10/2012, 8:43 AM

## 2012-04-10 NOTE — Progress Notes (Signed)
[facility] Adult Case Management Discharge Plan :  Will you be returning to the same living situation after discharge: Yes,  Halfway house in [place] At discharge, do you have transportation home?:Yes,  has fare for bus to [place]; given bus voucher to [place] Do you have the ability to pay for your medications:Not applicable  Release of information consent forms completed and in the chart;  Patient's signature needed at discharge.  Patient to Follow up at: Follow-up Information   Follow up with None - Patient refused.   Contact information:   No followup w threapy nor medication. Patient intends to follow uyp with AA and NA Meetings in [place] where he lives in a halfway house      Patient denies SI/HI:   Yes,  denies both    Aeronautical engineer and Suicide Prevention discussed:  Yes,  with patient  Clide Dales 04/10/2012, 9:44 AM

## 2012-04-13 NOTE — Progress Notes (Signed)
Patient Discharge Instructions:  No documentation sent.  The patient refused to sign the ROI.  Franklin [name], 04/13/2012, 2:57 PM

## 2012-08-02 ENCOUNTER — Emergency Department (HOSPITAL_COMMUNITY)
Admission: EM | Admit: 2012-08-02 | Discharge: 2012-08-03 | Disposition: A | Discharge status: Other Acute Inpatient Hospital | Payer: Self-pay | Attending: Emergency Medicine | Admitting: Emergency Medicine

## 2012-08-02 ENCOUNTER — Encounter (HOSPITAL_COMMUNITY): Payer: Self-pay | Admitting: *Deleted

## 2012-08-02 DIAGNOSIS — R443 Hallucinations, unspecified: Secondary | ICD-10-CM | POA: Insufficient documentation

## 2012-08-02 DIAGNOSIS — F411 Generalized anxiety disorder: Secondary | ICD-10-CM

## 2012-08-02 DIAGNOSIS — F1020 Alcohol dependence, uncomplicated: Secondary | ICD-10-CM

## 2012-08-02 DIAGNOSIS — Z8659 Personal history of other mental and behavioral disorders: Secondary | ICD-10-CM | POA: Insufficient documentation

## 2012-08-02 DIAGNOSIS — G479 Sleep disorder, unspecified: Secondary | ICD-10-CM | POA: Insufficient documentation

## 2012-08-02 DIAGNOSIS — F32A Depression, unspecified: Secondary | ICD-10-CM

## 2012-08-02 DIAGNOSIS — F329 Major depressive disorder, single episode, unspecified: Secondary | ICD-10-CM

## 2012-08-02 DIAGNOSIS — F419 Anxiety disorder, unspecified: Secondary | ICD-10-CM

## 2012-08-02 DIAGNOSIS — F3289 Other specified depressive episodes: Secondary | ICD-10-CM

## 2012-08-02 DIAGNOSIS — R45851 Suicidal ideations: Secondary | ICD-10-CM | POA: Insufficient documentation

## 2012-08-02 DIAGNOSIS — F319 Bipolar disorder, unspecified: Secondary | ICD-10-CM | POA: Insufficient documentation

## 2012-08-02 DIAGNOSIS — F1420 Cocaine dependence, uncomplicated: Secondary | ICD-10-CM

## 2012-08-02 DIAGNOSIS — F17200 Nicotine dependence, unspecified, uncomplicated: Secondary | ICD-10-CM | POA: Insufficient documentation

## 2012-08-02 DIAGNOSIS — F1910 Other psychoactive substance abuse, uncomplicated: Secondary | ICD-10-CM

## 2012-08-02 DIAGNOSIS — R450 Nervousness: Secondary | ICD-10-CM | POA: Insufficient documentation

## 2012-08-02 DIAGNOSIS — F919 Conduct disorder, unspecified: Secondary | ICD-10-CM | POA: Insufficient documentation

## 2012-08-02 HISTORY — DX: Schizophrenia, unspecified: F20.9

## 2012-08-02 LAB — RAPID URINE DRUG SCREEN, HOSP PERFORMED
Amphetamines: NOT DETECTED
Barbiturates: NOT DETECTED
Benzodiazepines: NOT DETECTED
Cocaine: POSITIVE — AB
Opiates: NOT DETECTED
Tetrahydrocannabinol: NOT DETECTED

## 2012-08-02 LAB — CBC
HCT: 45.3 % (ref 39.0–52.0)
Hemoglobin: 15.4 g/dL (ref 13.0–17.0)
MCH: 29.2 pg (ref 26.0–34.0)
MCHC: 34 g/dL (ref 30.0–36.0)
MCV: 85.8 fL (ref 78.0–100.0)
Platelets: 330 10*3/uL (ref 150–400)
RBC: 5.28 MIL/uL (ref 4.22–5.81)
RDW: 14 % (ref 11.5–15.5)
WBC: 7.3 10*3/uL (ref 4.0–10.5)

## 2012-08-02 LAB — COMPREHENSIVE METABOLIC PANEL
ALT: 14 U/L (ref 0–53)
AST: 22 U/L (ref 0–37)
Albumin: 4 g/dL (ref 3.5–5.2)
Alkaline Phosphatase: 70 U/L (ref 39–117)
BUN: 10 mg/dL (ref 6–23)
CO2: 26 mEq/L (ref 19–32)
Calcium: 9.6 mg/dL (ref 8.4–10.5)
Chloride: 100 mEq/L (ref 96–112)
Creatinine, Ser: 0.95 mg/dL (ref 0.50–1.35)
GFR calc Af Amer: >90 mL/min (ref >90)
GFR calc non Af Amer: >90 mL/min (ref >90)
Glucose, Bld: 141 mg/dL — ABNORMAL HIGH (ref 70–99)
Potassium: 4.2 mEq/L (ref 3.5–5.1)
Sodium: 141 mEq/L (ref 135–145)
Total Bilirubin: 1.4 mg/dL — ABNORMAL HIGH (ref 0.3–1.2)
Total Protein: 7.2 g/dL (ref 6.0–8.3)

## 2012-08-02 LAB — SALICYLATE LEVEL: Salicylate Lvl: <2 mg/dL — ABNORMAL LOW (ref 2.8–20.0)

## 2012-08-02 LAB — ETHANOL: Alcohol, Ethyl (B): <11 mg/dL (ref 0–11)

## 2012-08-02 LAB — ACETAMINOPHEN LEVEL: Acetaminophen (Tylenol), Serum: <15 ug/mL (ref 10–30)

## 2012-08-02 MED ORDER — LORAZEPAM 1 MG PO TABS: 0.0000 mg | ORAL_TABLET | Freq: Two times a day (BID) | ORAL | Status: DC

## 2012-08-02 MED ORDER — LORAZEPAM 1 MG PO TABS
0.0000 mg | ORAL_TABLET | Freq: Four times a day (QID) | ORAL | Status: DC
  Administered 2012-08-02 – 2012-08-03 (×2): 1 mg via ORAL
  Filled 2012-08-02 (×2): qty 1

## 2012-08-02 NOTE — ED Provider Notes (Signed)
CSN: [id number]     Arrival date & time 08/02/12  1211 History     First MD Initiated Contact with Patient 08/02/12 1220     Chief Complaint  Patient presents with  . Medical Clearance   (Consider location/radiation/quality/duration/timing/severity/associated sxs/prior Treatment) HPI Franklin [name] is a 42 y/o M with PMHx of depression, anxiety, bipolar disorder presenting to the ED with increased anxiety and depression over the past couple of weeks - denied be on medication for control. Patient reported that he has been hearing voices throughout the day, reported that these voices are telling him to hurt himself, suicidal voices. Patient reported that he has been having suicidal ideation without a plan for the past couple of weeks - patient stated that he has history of suicidal attempt with attempt approximately a year ago when patient tried to hang himself. Stated that he has been having trouble sleeping, decreased appetite. Stated that he has been resorting to alcohol, drank a lot this weekend - reported that he drank approximately 20-30 beers yesterday, denied use of liquor. Patient reported that he has been resorting to cocaine, approximately $500 per day - smoke the cocaine from Friday to yesterday. Denied fever, chills, shakes, tremors, gi symptoms, headaches, dizziness. HI.  PCP none    Past Medical History  Diagnosis Date  . Depression   . Anxiety   . Bipolar 1 disorder   . No pertinent past medical history   . Schizophrenia    Past Surgical History  Procedure Laterality Date  . No past surgeries     Family History  Problem Relation Age of Onset  . Hypertension Other   . Diabetes Other    History  Substance Use Topics  . Smoking status: Current Every Day Smoker -- 1.00 packs/day    Types: Cigarettes  . Smokeless tobacco: Never Used  . Alcohol Use: 7.2 oz/week    12 Cans of beer per week     Comment: heavy 12 pk/day    Review of Systems  Constitutional: Negative for  fever and chills.  Respiratory: Negative for chest tightness and shortness of breath.   Cardiovascular: Negative for chest pain.  Gastrointestinal: Negative for nausea, vomiting, abdominal pain and diarrhea.  Neurological: Negative for dizziness, weakness and headaches.  Psychiatric/Behavioral: Positive for suicidal ideas, hallucinations (auditory), behavioral problems, sleep disturbance and dysphoric mood. Negative for self-injury. The patient is nervous/anxious.   All other systems reviewed and are negative.    Allergies  Review of patient's allergies indicates no known allergies.  Home Medications  No current outpatient prescriptions on file. BP 147/77  Pulse 72  Temp(Src) 98.2 F (36.8 C) (Oral)  Resp 16  SpO2 97% Physical Exam  Nursing note and vitals reviewed. Constitutional: He is oriented to person, place, and time. He appears well-developed and well-nourished. No distress.  HENT:  Head: Normocephalic and atraumatic.  Mouth/Throat: Oropharynx is clear and moist. No oropharyngeal exudate.  Eyes: Conjunctivae and EOM are normal. Pupils are equal, round, and reactive to light. Right eye exhibits no discharge. Left eye exhibits no discharge.  Neck: Normal range of motion. Neck supple.  Cardiovascular: Normal rate, regular rhythm and normal heart sounds.  Exam reveals no friction rub.   No murmur heard. Pulses:      Radial pulses are 2+ on the right side, and 2+ on the left side.       Dorsalis pedis pulses are 2+ on the right side, and 2+ on the left side.  Pulmonary/Chest: Effort normal  and breath sounds normal. No respiratory distress. He has no wheezes. He has no rales.  Musculoskeletal: Normal range of motion. He exhibits no edema and no tenderness.  Lymphadenopathy:    He has no cervical adenopathy.  Neurological: He is alert and oriented to person, place, and time. No cranial nerve deficit. He exhibits normal muscle tone.  Cranial nerves III-XII grossly  intact Strength 5+/5+ with resistance  Skin: Skin is warm and dry. No rash noted. He is not diaphoretic. No erythema.  Psychiatric: He has a normal mood and affect. His behavior is normal. Thought content normal.    ED Course   Procedures (including critical care time)  Spoke with Taika at 1:22PM - ACT consult performed.   Labs Reviewed  COMPREHENSIVE METABOLIC PANEL - Abnormal; Notable for the following:    Glucose, Bld 141 (*)    Total Bilirubin 1.4 (*)    All other components within normal limits  SALICYLATE LEVEL - Abnormal; Notable for the following:    Salicylate Lvl <2.0 (*)    All other components within normal limits  URINE RAPID DRUG SCREEN (HOSP PERFORMED) - Abnormal; Notable for the following:    Cocaine POSITIVE (*)    All other components within normal limits  ACETAMINOPHEN LEVEL  CBC  ETHANOL   No results found. 1. Suicidal ideation   2. Hallucination   3. Depression   4. Anxiety   5. Bipolar 1 disorder   6. Polysubstance abuse     MDM  Patient presenting to the ED with medical clearance for increased depression and anxiety, auditory hallucinations with a suicidal theme, SI without a plan, increased alcohol and cocaine use. Patient has history of suicidal attempts with attempting suicide one year ago - tried to hang himself. Stated that he is not currently taking medications for maintenance.  Patient medically cleared. Psych orders placed. Telepsych and ACT consult ordered. Moved to psych ED. Discussed case with Dr. Marylen Ponto - transfer of care to Dr. Marylen Ponto at change in shift.   Raymon Mutton, PA-C 08/02/12 2238

## 2012-08-02 NOTE — ED Notes (Signed)
Pt reports hx of bipolar, depression, anxiety, schizophrenia. Pt reports SI attempt 2 years ago by hanging. Reports he has had SI thoughts and heard voices telling him SI in the last few weeks. Pt at present does not take any medications for schizophrenia or bipolar. Pt knows when he starts hearing voices with SI that he needs to get help because that is when it is getting bad. Pt drinks 12 beers/ day. Last ETOH use yesterday, used cocaine yesterday. Pt denies pain at present. Pt calm and cooperative.

## 2012-08-02 NOTE — Consult Note (Addendum)
Reason for Consult:  Suicidal ideations, cocaine and alcohol abuse Referring Physician: Dr. Joeseph [name] is an 42 y.o. male.  HPI: Patient requesting assistance with his depression, anxiety, cocaine and alcohol abuse.  He states he is suicidal but does not have a plan, denies homicidal ideation.  Franklin [name] states he hears voices to kill himself but does not appear to be responding to internal stimuli, denies visual hallucinations.  His last drink was yesterday, 12 beers daily-no withdrawal symptoms noted; abused cocaine over the week-end.  Franklin [name] was unable to state any recent stressors and has not been taking any medications.  He has been to multiple hospitals and [address] [facility] [facility], [place], [place],.[facility]).  Past Medical History  Diagnosis Date  . Depression   . Anxiety   . Bipolar 1 disorder   . No pertinent past medical history   . Schizophrenia     Past Surgical History  Procedure Laterality Date  . No past surgeries      Family History  Problem Relation Age of Onset  . Hypertension Other   . Diabetes Other     Social History:  reports that he has been smoking Cigarettes.  He has been smoking about 1.00 pack per day. He has never used smokeless tobacco. He reports that he drinks about 7.2 ounces of alcohol per week. He reports that he uses illicit drugs (Marijuana and "Crack" cocaine).  Allergies: No Known Allergies  Medications: I have reviewed the patient's current medications.  Results for orders placed during the hospital encounter of 08/02/12 (from the past 48 hour(s))  ACETAMINOPHEN LEVEL     Status: None   Collection Time    08/02/12 12:30 PM      Result Value Range   Acetaminophen (Tylenol), Serum <15.0  10 - 30 ug/mL   Comment:            THERAPEUTIC CONCENTRATIONS VARY     SIGNIFICANTLY. A RANGE OF 10-30     ug/mL MAY BE AN EFFECTIVE     CONCENTRATION FOR MANY PATIENTS.     HOWEVER, SOME ARE BEST TREATED     AT CONCENTRATIONS OUTSIDE THIS     RANGE.      ACETAMINOPHEN CONCENTRATIONS     >150 ug/mL AT 4 HOURS AFTER     INGESTION AND >50 ug/mL AT 12     HOURS AFTER INGESTION ARE     OFTEN ASSOCIATED WITH TOXIC     REACTIONS.  CBC     Status: None   Collection Time    08/02/12 12:30 PM      Result Value Range   WBC 7.3  4.0 - 10.5 K/uL   RBC 5.28  4.22 - 5.81 MIL/uL   Hemoglobin 15.4  13.0 - 17.0 g/dL   HCT 78.2  95.6 - 21.3 %   MCV 85.8  78.0 - 100.0 fL   MCH 29.2  26.0 - 34.0 pg   MCHC 34.0  30.0 - 36.0 g/dL   RDW 08.6  57.8 - 46.9 %   Platelets 330  150 - 400 K/uL  COMPREHENSIVE METABOLIC PANEL     Status: Abnormal   Collection Time    08/02/12 12:30 PM      Result Value Range   Sodium 141  135 - 145 mEq/L   Potassium 4.2  3.5 - 5.1 mEq/L   Chloride 100  96 - 112 mEq/L   CO2 26  19 - 32 mEq/L   Glucose, Bld 141 (*)  70 - 99 mg/dL   BUN 10  6 - 23 mg/dL   Creatinine, Ser 1.61  0.50 - 1.35 mg/dL   Calcium 9.6  8.4 - 09.6 mg/dL   Total Protein 7.2  6.0 - 8.3 g/dL   Albumin 4.0  3.5 - 5.2 g/dL   AST 22  0 - 37 U/L   ALT 14  0 - 53 U/L   Alkaline Phosphatase 70  39 - 117 U/L   Total Bilirubin 1.4 (*) 0.3 - 1.2 mg/dL   GFR calc non Af Amer >90  >90 mL/min   GFR calc Af Amer >90  >90 mL/min   Comment:            The eGFR has been calculated     using the CKD EPI equation.     This calculation has not been     validated in all clinical     situations.     eGFR's persistently     <90 mL/min signify     possible Chronic Kidney Disease.  ETHANOL     Status: None   Collection Time    08/02/12 12:30 PM      Result Value Range   Alcohol, Ethyl (B) <11  0 - 11 mg/dL   Comment:            LOWEST DETECTABLE LIMIT FOR     SERUM ALCOHOL IS 11 mg/dL     FOR MEDICAL PURPOSES ONLY  SALICYLATE LEVEL     Status: Abnormal   Collection Time    08/02/12 12:30 PM      Result Value Range   Salicylate Lvl <2.0 (*) 2.8 - 20.0 mg/dL  URINE RAPID DRUG SCREEN (HOSP PERFORMED)     Status: Abnormal   Collection Time    08/02/12 12:56 PM       Result Value Range   Opiates NONE DETECTED  NONE DETECTED   Cocaine POSITIVE (*) NONE DETECTED   Benzodiazepines NONE DETECTED  NONE DETECTED   Amphetamines NONE DETECTED  NONE DETECTED   Tetrahydrocannabinol NONE DETECTED  NONE DETECTED   Barbiturates NONE DETECTED  NONE DETECTED   Comment:            DRUG SCREEN FOR MEDICAL PURPOSES     ONLY.  IF CONFIRMATION IS NEEDED     FOR ANY PURPOSE, NOTIFY LAB     WITHIN 5 DAYS.                LOWEST DETECTABLE LIMITS     FOR URINE DRUG SCREEN     Drug Class       Cutoff (ng/mL)     Amphetamine      1000     Barbiturate      200     Benzodiazepine   200     Tricyclics       300     Opiates          300     Cocaine          300     THC              50    No results found.  Review of Systems  Constitutional: Negative.   HENT: Negative.   Eyes: Negative.   Respiratory: Negative.   Cardiovascular: Negative.   Gastrointestinal: Negative.   Genitourinary: Negative.   Musculoskeletal: Negative.   Skin: Negative.   Neurological: Negative.   Endo/Heme/Allergies: Negative.  Psychiatric/Behavioral: Positive for depression, suicidal ideas and substance abuse. The patient is nervous/anxious.    Blood pressure 147/77, pulse 72, temperature 98.2 F (36.8 C), temperature source Oral, resp. rate 16, SpO2 97.00%. Physical Exam Completed by primary MD, reviewed  Family History:  No family history on file.  Mental Status Examination/Evaluation: Patient is well-groomed and irritable with congruent affect.  He answers questions but does not elaborate on the answers, vague.  Suicidal ideations but no plan, denies homicidal ideations, hearing voices but does not appear to be responding to internal stimuli.  His thoughts are organized and answers questions appropriately.  There is no paranoia or delusions present at this time. His attention and concentration are fair.  His insight and judgment are fair, impulse control intact.  DIAGNOSIS:   AXIS I  Alcohol detox/dependency, Cocaine abuse, depression, anxiety  AXIS II  Deferred   AXIS III  None  AXIS IV  other psychosocial or environmental problems, homeless, unemployed-financial issues, problems with access to health care services and problems with primary support group   AXIS V  45   Assessment/Plan:  Patient's story is inconsistent at this point.  He told his nurse two hours after this assessment that he was not suicidal/homicidal and no hallucinations.  Further monitoring will continue before further plans made.  Nanine Means, PMH-NP 08/02/2012, 1:57 PM    I agreed with the findings, treatment plan and disposition of this patient. Kathryne Sharper, MD  08/03/2012  Franklin [name] says he is still suicidal.  Quit his job because he is "overwhelmed".  Relapsed on alcohol and cocaine.  Stopped meds 6 months ago to see if he could do without them,he said and did OK until a few weeks ago.  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  The voices he thinks he can deal with.  Consequently, recommend inpatient admission to [facility] .

## 2012-08-02 NOTE — BH Assessment (Signed)
Assessment Note   Franklin [name] is an 42 y.o. male who presents to the ED with SI with auditory voices with command. CSW and NP met with pt at bedside to complete assessment. Patient reports SI, with voices telling patient, "You might as well die, you should try again". Patient reports history of SI attempt by hanging self 2 years ago. Patient reports that sometimes he has an plan of harming himself but other times he doesn't. Patient reports thinking about hanging himself to try again. Pt reports that he wants to get back on medication. Patient reports that he would like to go to an inpatient stay and participate in treatment and get back on medication. Patient unable to contract for safety at this time. Pt referred to further assessment and evaluation. Patient reports that he is feeling overwhelmed and lost his job due to feeling overwhelmed. Patient unable to identify current stressors besides feeling overwhelmed. Patient reports he currently lives with his mother, and has a strong support system in mother and sister. Pt reports drinking 12 beers per day for the past few months, and cocaine $200-$300 per day over the weekend. Patient reports that he has not taken medications and not followed up with [facility].   Axis I: Psychotic disorder nos, alcohol abuse, cocaine abuse  Axis II: Deferred Axis III:  Past Medical History  Diagnosis Date  . Depression   . Anxiety   . Bipolar 1 disorder   . No pertinent past medical history   . Schizophrenia    Axis IV: occupational problems, other psychosocial or environmental problems, problems related to social environment, problems with access to health care services and problems with primary support group Axis V: 21-30 behavior considerably influenced by delusions or hallucinations OR serious impairment in judgment, communication OR inability to function in almost all areas  Past Medical History:  Past Medical History  Diagnosis Date  . Depression   .  Anxiety   . Bipolar 1 disorder   . No pertinent past medical history   . Schizophrenia     Past Surgical History  Procedure Laterality Date  . No past surgeries      Family History:  Family History  Problem Relation Age of Onset  . Hypertension Other   . Diabetes Other     Social History:  reports that he has been smoking Cigarettes.  He has been smoking about 1.00 pack per day. He has never used smokeless tobacco. He reports that he drinks about 7.2 ounces of alcohol per week. He reports that he uses illicit drugs (Marijuana and "Crack" cocaine).  Additional Social History:  Alcohol / Drug Use History of alcohol / drug use?: Yes Substance #1 Name of Substance 1: alcohol  1 - Age of First Use: 15 1 - Amount (size/oz): 12 beers  1 - Frequency: daily  1 - Duration: months 1 - Last Use / Amount: yesterday 12 beers Substance #2 Name of Substance 2: cocaine 2 - Age of First Use: teens 2 - Amount (size/oz): 200-300 per day 2 - Frequency: daily  2 - Duration: this weekend 2 - Last Use / Amount: yesterday   CIWA: CIWA-Ar BP: 147/77 mmHg Pulse Rate: 72 COWS:    Allergies: No Known Allergies  Home Medications:  (Not in a hospital admission)  OB/GYN Status:  No LMP for male patient.  General Assessment Data Location of Assessment: [facility] Living Arrangements: Parent Can pt return to current living arrangement?: Yes Admission Status: Voluntary Is patient capable  of signing voluntary admission?: Yes Transfer from: Home Referral Source: Self/Family/Friend  Education Status Is patient currently in school?: No Highest grade of school patient has completed: 3 years college  Risk to self Suicidal Ideation: Yes-Currently Present Suicidal Intent: No Is patient at risk for suicide?: Yes Suicidal Plan?: No Access to Means: Yes Specify Access to Suicidal Means: history of attempting to hang self, access to rope What has been your use of drugs/alcohol within the last 12  months?: alcohol and cocaine Previous Attempts/Gestures: Yes How many times?: 1 Other Self Harm Risks: no Triggers for Past Attempts: Unknown Intentional Self Injurious Behavior: None Family Suicide History: No Recent stressful life event(s):  (pt reports feeling overwhelmed) Persecutory voices/beliefs?: No Depression: Yes Depression Symptoms: Despondent Substance abuse history and/or treatment for substance abuse?: Yes  Risk to Others Homicidal Ideation: No Thoughts of Harm to Others: No Current Homicidal Intent: No Current Homicidal Plan: No Access to Homicidal Means: No Identified Victim: n/a History of harm to others?: No Assessment of Violence: None Noted Violent Behavior Description: none Does patient have access to weapons?: No Criminal Charges Pending?: No Does patient have a court date: No  Psychosis Hallucinations: Auditory;With command Delusions: None noted  Mental Status Report Appear/Hygiene:  (calm and casual) Eye Contact: Good Motor Activity: Freedom of movement Speech: Logical/coherent Level of Consciousness: Alert Mood: Ambivalent;Irritable Affect: Irritable Anxiety Level: None Thought Processes: Coherent;Relevant Judgement: Unimpaired Orientation: Person;Place;Time;Situation Obsessive Compulsive Thoughts/Behaviors: None  Cognitive Functioning Concentration: Normal Memory: Recent Intact;Remote Intact IQ: Average Insight: Fair Impulse Control: Fair Appetite: Poor Sleep: Decreased Vegetative Symptoms: None  ADLScreening [facility]) Patient's cognitive ability adequate to safely complete daily activities?: Yes Patient able to express need for assistance with ADLs?: Yes Independently performs ADLs?: Yes (appropriate for developmental age)  Abuse/Neglect [facility]) Physical Abuse: Denies Verbal Abuse: Denies Sexual Abuse: Denies  Prior Inpatient Therapy Prior Inpatient Therapy: Yes Prior Therapy Dates: 2014 Prior Therapy  Facilty[facility]Provider(s): [facility] Reason for Treatment: depression, substance abuse   Prior Outpatient Therapy Prior Outpatient Therapy: No  ADL Screening (condition at time of admission) Patient's cognitive ability adequate to safely complete daily activities?: Yes Patient able to express need for assistance with ADLs?: Yes Independently performs ADLs?: Yes (appropriate for developmental age)       Abuse/Neglect Assessment (Assessment to be complete while patient is alone) Physical Abuse: Denies Verbal Abuse: Denies Sexual Abuse: Denies Values / Beliefs Cultural Requests During Hospitalization: None Spiritual Requests During Hospitalization: None        Additional Information 1:1 In Past 12 Months?: No CIRT Risk: No Elopement Risk: No Does patient have medical clearance?: Yes     Disposition:  Disposition Initial Assessment Completed for this Encounter: Yes Disposition of Patient: Inpatient treatment program;Outpatient treatment Type of inpatient treatment program: Adult Type of outpatient treatment: Adult  On Site Evaluation by:   Reviewed with Physician:     Catha Gosselin A 08/02/2012 1:58 PM

## 2012-08-03 ENCOUNTER — Encounter (HOSPITAL_COMMUNITY): Payer: Self-pay | Admitting: Intensive Care

## 2012-08-03 ENCOUNTER — Inpatient Hospital Stay (HOSPITAL_COMMUNITY)
Admission: AD | Admit: 2012-08-03 | Discharge: 2012-08-04 | DRG: 897 | Disposition: A | Discharge status: Home / Self Care | Payer: No Typology Code available for payment source | Source: Intra-hospital | Attending: Psychiatry | Admitting: Psychiatry

## 2012-08-03 DIAGNOSIS — F1910 Other psychoactive substance abuse, uncomplicated: Secondary | ICD-10-CM

## 2012-08-03 DIAGNOSIS — F3130 Bipolar disorder, current episode depressed, mild or moderate severity, unspecified: Secondary | ICD-10-CM | POA: Diagnosis present

## 2012-08-03 DIAGNOSIS — F1920 Other psychoactive substance dependence, uncomplicated: Principal | ICD-10-CM | POA: Diagnosis present

## 2012-08-03 DIAGNOSIS — F209 Schizophrenia, unspecified: Secondary | ICD-10-CM | POA: Diagnosis present

## 2012-08-03 DIAGNOSIS — Z79899 Other long term (current) drug therapy: Secondary | ICD-10-CM

## 2012-08-03 DIAGNOSIS — F411 Generalized anxiety disorder: Secondary | ICD-10-CM | POA: Diagnosis present

## 2012-08-03 MED ORDER — GABAPENTIN 100 MG PO CAPS
200.0000 mg | ORAL_CAPSULE | Freq: Three times a day (TID) | ORAL | Status: DC
  Administered 2012-08-03 – 2012-08-04 (×2): 200 mg via ORAL
  Filled 2012-08-03: qty 84
  Filled 2012-08-03 (×3): qty 2
  Filled 2012-08-03: qty 84
  Filled 2012-08-03: qty 2
  Filled 2012-08-03: qty 84
  Filled 2012-08-03: qty 2

## 2012-08-03 MED ORDER — ACETAMINOPHEN 325 MG PO TABS: 650.0000 mg | ORAL_TABLET | Freq: Four times a day (QID) | ORAL | Status: DC | PRN

## 2012-08-03 MED ORDER — CHLORDIAZEPOXIDE HCL 25 MG PO CAPS
25.0000 mg | ORAL_CAPSULE | Freq: Once | ORAL | Status: AC
  Administered 2012-08-03: 25 mg via ORAL

## 2012-08-03 MED ORDER — BUPROPION HCL ER (XL) 150 MG PO TB24
150.0000 mg | ORAL_TABLET | Freq: Every day | ORAL | Status: DC
  Filled 2012-08-03 (×2): qty 1

## 2012-08-03 MED ORDER — NICOTINE 21 MG/24HR TD PT24
21.0000 mg | MEDICATED_PATCH | Freq: Every day | TRANSDERMAL | Status: DC
  Administered 2012-08-04: 21 mg via TRANSDERMAL
  Filled 2012-08-03 (×4): qty 1

## 2012-08-03 MED ORDER — ADULT MULTIVITAMIN W/MINERALS CH
1.0000 | ORAL_TABLET | Freq: Every day | ORAL | Status: DC
  Administered 2012-08-03 – 2012-08-04 (×2): 1 via ORAL
  Filled 2012-08-03 (×4): qty 1

## 2012-08-03 MED ORDER — ONDANSETRON 4 MG PO TBDP: 4.0000 mg | ORAL_TABLET | Freq: Four times a day (QID) | ORAL | Status: DC | PRN

## 2012-08-03 MED ORDER — CHLORDIAZEPOXIDE HCL 25 MG PO CAPS: 25.0000 mg | ORAL_CAPSULE | Freq: Every day | ORAL | Status: DC

## 2012-08-03 MED ORDER — ALUM & MAG HYDROXIDE-SIMETH 200-200-20 MG/5ML PO SUSP: 30.0000 mL | ORAL | Status: DC | PRN

## 2012-08-03 MED ORDER — CHLORDIAZEPOXIDE HCL 25 MG PO CAPS
25.0000 mg | ORAL_CAPSULE | Freq: Four times a day (QID) | ORAL | Status: DC
  Administered 2012-08-03 – 2012-08-04 (×3): 25 mg via ORAL
  Filled 2012-08-03 (×4): qty 1

## 2012-08-03 MED ORDER — THIAMINE HCL 100 MG/ML IJ SOLN
100.0000 mg | Freq: Once | INTRAMUSCULAR | Status: AC
  Administered 2012-08-03: 100 mg via INTRAMUSCULAR

## 2012-08-03 MED ORDER — LOPERAMIDE HCL 2 MG PO CAPS: 2.0000 mg | ORAL_CAPSULE | ORAL | Status: DC | PRN

## 2012-08-03 MED ORDER — CHLORDIAZEPOXIDE HCL 25 MG PO CAPS: 25.0000 mg | ORAL_CAPSULE | ORAL | Status: DC

## 2012-08-03 MED ORDER — TRAZODONE HCL 100 MG PO TABS
100.0000 mg | ORAL_TABLET | Freq: Every evening | ORAL | Status: DC | PRN
  Filled 2012-08-03: qty 14

## 2012-08-03 MED ORDER — CHLORDIAZEPOXIDE HCL 25 MG PO CAPS: 25.0000 mg | ORAL_CAPSULE | Freq: Four times a day (QID) | ORAL | Status: DC | PRN

## 2012-08-03 MED ORDER — HYDROXYZINE HCL 25 MG PO TABS: 25.0000 mg | ORAL_TABLET | Freq: Four times a day (QID) | ORAL | Status: DC | PRN

## 2012-08-03 MED ORDER — VITAMIN B-1 100 MG PO TABS
100.0000 mg | ORAL_TABLET | Freq: Every day | ORAL | Status: DC
  Administered 2012-08-04: 100 mg via ORAL
  Filled 2012-08-03 (×3): qty 1

## 2012-08-03 MED ORDER — CHLORDIAZEPOXIDE HCL 25 MG PO CAPS: 25.0000 mg | ORAL_CAPSULE | Freq: Three times a day (TID) | ORAL | Status: DC

## 2012-08-03 MED ORDER — BUPROPION HCL ER (XL) 150 MG PO TB24
150.0000 mg | ORAL_TABLET | Freq: Every day | ORAL | Status: DC
  Administered 2012-08-04: 150 mg via ORAL
  Filled 2012-08-03: qty 14
  Filled 2012-08-03 (×2): qty 1

## 2012-08-03 MED ORDER — MAGNESIUM HYDROXIDE 400 MG/5ML PO SUSP: 30.0000 mL | Freq: Every day | ORAL | Status: DC | PRN

## 2012-08-03 NOTE — Progress Notes (Signed)
[facility] provided patient with a [employer] Orange Card application, highlighting [facility].  °

## 2012-08-03 NOTE — Progress Notes (Signed)
Patient admitted to [facility] from [facility]. Patient states that he is here for alcohol detox. Patient reports drinking a "12 pack of beer daily" and also states that he uses about "$200-300 worth of cocaine per day." The patient presents in no distress and speaks calmly but states that he is "doing bad." The patient is requesting meals to to be brought to his room and states that he has "been here before" and that he can "do what he wants." The patient was given education regarding unit rules and regulations. The patient was given education regarding his fall risk status (low). The patient was escorted to unit by RN and MHT. Patient currently denies SI/HI and auditory and visual hallucinations. NP Nwoko was mad aware about need for orders. Will continue to monitor patient for safety.

## 2012-08-03 NOTE — ED Provider Notes (Signed)
Patient accepted at [facility] for suicidal ideation and hallucinations. Stable for transfer.   Richardean Canal, MD 08/03/12 [phone]

## 2012-08-03 NOTE — ED Notes (Signed)
Pt is pleasant and cooperative report given to [facility] RN Lupita Leash. discarged vitals BP123/72 temp 97.4 67 HR RR 16 unlabored Escorted via [employer] and [facility] transported to [facility]. Safety monitor and maintained.

## 2012-08-03 NOTE — H&P (Signed)
Psychiatric Admission Assessment Adult  Patient Identification:  Franklin [name] Date of Evaluation:  08/03/2012 Chief Complaint:  ETOH DEPENDENCE; COCAINE ABUSE, DEPRESSIVE DISRODER  ANXIETY MIXED History of Present Illness:: 42 Y/O male who was admitted to our unit in April of this year. States after he left he did well for a while. 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 "just colapsed" Got overhwlemed, with loneliness and the depression, got worst ,anxiety kicked in started hearing voices. Voices told him to hurt himself. He states this past Friday he got so down with the voices that he relapsed on crack. Using every day. Also drinking 12 to 20 beers a day. States bieng n the truck by himself did not help. He was not going to meetings the only interaction was with hs son over the phone. Elements:  Location:  in patient. Quality:  unable to fucntion. Severity:  severe. Timing:  every day. Duration:  building up for the last several weeks. Context:  recurrence of the depression with psychotic features with relapse on alcohol, cocaine. Associated Signs/Synptoms: Depression Symptoms:  depressed mood, anhedonia, insomnia, fatigue, feelings of worthlessness/guilt, difficulty concentrating, suicidal thoughts without plan, anxiety, panic attacks, loss of energy/fatigue, disturbed sleep, decreased appetite, (Hypo) Manic Symptoms:  Hallucinations, Impulsivity, Irritable Mood, Labiality of Mood, Anxiety Symptoms:  Excessive Worry, Panic Symptoms, Psychotic Symptoms:  Hallucinations: Auditory Command:  kill himself Paranoia, PTSD Symptoms: Had a traumatic exposure:  1993-1997 in prison for something he did not do Re-experiencing:  Flashbacks Intrusive Thoughts Nightmares  Psychiatric Specialty Exam: Physical Exam  Review of Systems  Constitutional: Positive for malaise/fatigue and diaphoresis.  Eyes: Negative.   Respiratory: Negative.   Cardiovascular: Negative.    Gastrointestinal: Positive for nausea.  Genitourinary: Negative.   Musculoskeletal: Negative.   Skin: Negative.   Neurological: Positive for tremors and weakness.  Endo/Heme/Allergies: Negative.   Psychiatric/Behavioral: Positive for depression, suicidal ideas, hallucinations and substance abuse. The patient is nervous/anxious and has insomnia.     Blood pressure 122/84, pulse 92, temperature 98.2 F (36.8 C), temperature source Oral, resp. rate 18, height 5\' 10"  (1.778 m), weight 72.576 kg (160 lb), SpO2 94.00%.Body mass index is 22.96 kg/(m^2).  General Appearance: Disheveled  Eye Solicitor::  Fair  Speech:  Clear and Coherent, Slow and not spontaneous  Volume:  fluctuates  Mood:  Anxious, Depressed and worried  Affect:  Restricted  Thought Process:  Coherent and Goal Directed  Orientation:  Full (Time, Place, and Person)  Thought Content:  Hallucinations: Auditory Command:  hurt himself and negative comments  Suicidal Thoughts:  Yes.  without intent/plan  Homicidal Thoughts:  No  Memory:  Immediate;   Fair Recent;   Fair Remote;   Fair  Judgement:  Fair  Insight:  Present  Psychomotor Activity:  Restlessness  Concentration:  Fair  Recall:  Fair  Akathisia:  No  Handed:  Right  AIMS (if indicated):     Assets:  Desire for Improvement Vocational/Educational  Sleep:       Past Psychiatric History: Diagnosis:  Hospitalizations: [facility], [facility] ( 3 years ago) [place] (2 years ago)  Outpatient Care:  Substance Abuse Care: [facility], [facility], [facility], [facility]  Self-Mutilation: Denies  Suicidal Attempts: Yes  Violent Behaviors: Denies   Past Medical History:   Past Medical History  Diagnosis Date  . Depression   . Anxiety   . Bipolar 1 disorder   . No pertinent past medical history   . Schizophrenia     Allergies:  No Known Allergies PTA Medications: No prescriptions prior to admission    Previous Psychotropic Medications:  Medication/Dose  States he is not taking  any. 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  He has been smoking about 1.00 pack per day. He has never used smokeless tobacco. He reports that he drinks about 7.2 ounces of alcohol per week. He reports that he uses illicit drugs (Marijuana and "Crack" cocaine). 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 Legal History: 4 years in prison, for something he did not do Hobbies/Interests:  Family History:   Family History  Problem Relation Age of Onset  . Hypertension Other   . Diabetes Other     Results for orders placed during the hospital encounter of 08/02/12 (from the past 72 hour(s))  ACETAMINOPHEN LEVEL     Status: None   Collection Time    08/02/12 12:30 PM      Result Value Range   Acetaminophen (Tylenol), Serum <15.0  10 - 30 ug/mL   Comment:            THERAPEUTIC CONCENTRATIONS VARY     SIGNIFICANTLY. A RANGE OF 10-30     ug/mL MAY BE AN EFFECTIVE     CONCENTRATION FOR MANY PATIENTS.     HOWEVER, SOME ARE BEST TREATED     AT CONCENTRATIONS OUTSIDE THIS     RANGE.     ACETAMINOPHEN CONCENTRATIONS     >150 ug/mL AT 4 HOURS AFTER     INGESTION AND >50 ug/mL AT 12     HOURS AFTER INGESTION ARE     OFTEN ASSOCIATED WITH  TOXIC     REACTIONS.  CBC     Status: None   Collection Time    08/02/12 12:30 PM      Result Value Range   WBC 7.3  4.0 - 10.5 K/uL   RBC 5.28  4.22 - 5.81 MIL/uL   Hemoglobin 15.4  13.0 - 17.0 g/dL   HCT 45.4  09.8 - 11.9 %   MCV 85.8  78.0 - 100.0 fL   MCH 29.2  26.0 - 34.0 pg   MCHC 34.0  30.0 - 36.0 g/dL   RDW 14.7  82.9 - 56.2 %   Platelets 330  150 - 400 K/uL  COMPREHENSIVE METABOLIC PANEL     Status: Abnormal   Collection Time    08/02/12 12:30 PM      Result Value Range   Sodium 141  135 - 145 mEq/L   Potassium 4.2  3.5 - 5.1 mEq/L   Chloride 100  96 - 112 mEq/L   CO2 26  19 - 32 mEq/L   Glucose, Bld  141 (*) 70 - 99 mg/dL   BUN 10  6 - 23 mg/dL   Creatinine, Ser 8.29  0.50 - 1.35 mg/dL   Calcium 9.6  8.4 - 56.2 mg/dL   Total Protein 7.2  6.0 - 8.3 g/dL   Albumin 4.0  3.5 - 5.2 g/dL   AST 22  0 - 37 U/L   ALT 14  0 - 53 U/L   Alkaline Phosphatase 70  39 - 117 U/L   Total Bilirubin 1.4 (*) 0.3 - 1.2 mg/dL   GFR calc non Af Amer >90  >90 mL/min   GFR calc Af Amer >90  >90 mL/min   Comment:            The eGFR has been calculated     using the CKD EPI equation.     This calculation has not been     validated in all clinical     situations.     eGFR's persistently     <90 mL/min signify     possible Chronic Kidney Disease.  ETHANOL     Status: None   Collection Time    08/02/12 12:30 PM      Result Value Range   Alcohol, Ethyl (B) <11  0 - 11 mg/dL   Comment:            LOWEST DETECTABLE LIMIT FOR     SERUM ALCOHOL IS 11 mg/dL     FOR MEDICAL PURPOSES ONLY  SALICYLATE LEVEL     Status: Abnormal   Collection Time    08/02/12 12:30 PM      Result Value Range   Salicylate Lvl <2.0 (*) 2.8 - 20.0 mg/dL  URINE RAPID DRUG SCREEN (HOSP PERFORMED)     Status: Abnormal   Collection Time    08/02/12 12:56 PM      Result Value Range   Opiates NONE DETECTED  NONE DETECTED   Cocaine POSITIVE (*) NONE DETECTED   Benzodiazepines NONE DETECTED  NONE DETECTED    Amphetamines NONE DETECTED  NONE DETECTED   Tetrahydrocannabinol NONE DETECTED  NONE DETECTED   Barbiturates NONE DETECTED  NONE DETECTED   Comment:            DRUG SCREEN FOR MEDICAL PURPOSES     ONLY.  IF CONFIRMATION IS NEEDED     FOR ANY PURPOSE, NOTIFY LAB     WITHIN 5 DAYS.                LOWEST DETECTABLE LIMITS     FOR URINE DRUG SCREEN     Drug Class       Cutoff (ng/mL)     Amphetamine      1000     Barbiturate      200     Benzodiazepine   200     Tricyclics       300     Opiates          300     Cocaine          300     THC              50   Psychological Evaluations:  Assessment:   AXIS I:  Major Depression with psychotic features vs Bipolar Depression, Alcohol, Cocaine Dependence AXIS II:  Deferred AXIS III:   Past Medical History  Diagnosis Date  . Depression   . Anxiety   . Bipolar 1 disorder   . No  pertinent past medical history   . Schizophrenia    AXIS IV:  occupational problems, other psychosocial or environmental problems and problems with primary support group AXIS V:  41-50 serious symptoms  Treatment Plan/Recommendations:  Supportive approach/coping skills/relapse prevention                                                                 Reassess and address the co morbidities  Treatment Plan Summary: Daily contact with patient to assess and evaluate symptoms and progress in treatment Medication management Current Medications:  Current Facility-Administered Medications  Medication Dose Route Frequency Provider Last Rate Last Dose  . acetaminophen (TYLENOL) tablet 650 mg  650 mg Oral Q6H PRN [name], NP      . alum & mag hydroxide-simeth (MAALOX/MYLANTA) 200-200-20 MG/5ML suspension 30 mL  30 mL Oral Q4H PRN [name], NP      . magnesium hydroxide (MILK OF MAGNESIA) suspension 30 mL  30 mL Oral Daily PRN [name], NP      . [name] ON 08/04/2012] nicotine (NICODERM CQ - dosed in mg/24 hours) patch 21 mg  21 mg Transdermal Q0600  [name], NP      . traZODone (DESYREL) tablet 100 mg  100 mg Oral QHS PRN [name], NP        Observation Level/Precautions:  15 minute checks  Laboratory:  As per the ED  Psychotherapy:  Individual/group  Medications:  Librium detox/Wellbutrin/Neurontin  Consultations:    Discharge Concerns:    Estimated LOS: 3-5 days  Other:     I certify that inpatient services furnished can reasonably be expected to improve the patient's condition.   Deyani Hegarty A 7/31/20142:03 PM

## 2012-08-03 NOTE — Progress Notes (Addendum)
Addendum: Patient accepted to [facility] to Dr. Dub Mikes [phone]. Patient to be transported by security as patient is voluntary. EDP and RN informed of patient transfer to [facility].   Patient reassessed by Dr. [name] who is accepting patient to [facility] pending bed.   Catha Gosselin, LCSWA  [phone] .08/03/2012 9:27am

## 2012-08-03 NOTE — ED Provider Notes (Signed)
  Medical screening examination/treatment/procedure(s) were performed by non-physician practitioner and as supervising physician I was immediately available for consultation/collaboration.   Jabes Primo, MD 08/03/12 0722 

## 2012-08-03 NOTE — Progress Notes (Signed)
Patient did not attend the evening karaoke group. Pt remained in bed during group.  

## 2012-08-03 NOTE — ED Notes (Signed)
Pt is awake and alert, denies HI or SI. Pt is pleasant and cooperative. Pt is medication compliant will continue to monitor for safety.

## 2012-08-03 NOTE — [facility] Group Notes (Signed)
[facility] LCSW Group Therapy  08/03/2012 4:54 PM  Type of Therapy:  Group Therapy  Participation Level:  Did Not Attend  [name], Franklin Benegas 08/03/2012, 4:54 PM  

## 2012-08-03 NOTE — [facility] Suicide Risk Assessment (Signed)
Suicide Risk Assessment  Admission Assessment     Nursing information obtained from:    Demographic factors:    Current Mental Status:    Loss Factors:    Historical Factors:    Risk Reduction Factors:     CLINICAL FACTORS:   Bipolar Disorder:   Depressive phase Alcohol/Substance Abuse/Dependencies  COGNITIVE FEATURES THAT CONTRIBUTE TO RISK:  Closed-mindedness Polarized thinking Thought constriction (tunnel vision)    SUICIDE RISK:   Moderate:  Frequent suicidal ideation with limited intensity, and duration, some specificity in terms of plans, no associated intent, good self-control, limited dysphoria/symptomatology, some risk factors present, and identifiable protective factors, including available and accessible social support.  PLAN OF CARE: Supportive approach/coping skills/relapse prevention                               Librium Detox protocol                               Reassess and address the co morbidities  I certify that inpatient services furnished can reasonably be expected to improve the patient's condition.  Nova Schmuhl A 08/03/2012, 2:41 PM

## 2012-08-03 NOTE — Progress Notes (Signed)
Recreation Therapy Notes   Date: 07.31.2014 Time: 3:00pm Location: [address] Dayroom  Group Topic: Leisure Education  Goal Area(s) Addresses:  Patient will verbalize activity of interest by end of group session. Patient will verbalize the ability to use positive leisure/recreation as a coping mechanism.  Behavioral Response: Did not attend.  Marykay Lex Makaylah Oddo, LRT/CTRS  Jearl Klinefelter 08/03/2012 4:13 PM

## 2012-08-04 DIAGNOSIS — F3130 Bipolar disorder, current episode depressed, mild or moderate severity, unspecified: Secondary | ICD-10-CM

## 2012-08-04 MED ORDER — TRAZODONE HCL 100 MG PO TABS: 100.0000 mg | ORAL_TABLET | Freq: Every evening | ORAL | Status: DC | PRN

## 2012-08-04 MED ORDER — BUPROPION HCL ER (XL) 150 MG PO TB24: 150.0000 mg | ORAL_TABLET | Freq: Every day | ORAL | Status: DC

## 2012-08-04 MED ORDER — GABAPENTIN 100 MG PO CAPS: 200.0000 mg | ORAL_CAPSULE | Freq: Three times a day (TID) | ORAL | Status: DC

## 2012-08-04 NOTE — [facility] Group Notes (Signed)
[facility] LCSW Aftercare Discharge Planning Group Note   08/04/2012 9:57 AM  Participation Quality:  Appropriate  Mood/Affect:  Anxious  Depression Rating:  0  Anxiety Rating:  5  Thoughts of Suicide:  No Will you contract for safety?   NA  Current AVH:  No  Plan for Discharge/Comments:  Pt plans to follow up at [facility] for med management and therpay. He currently sees therapist once monthly. Pt is adamant that he must d/c today or he risks going to jail. "I have my boss's car keys and work keys. He is giving me until 1PM to get them back to him in [place]. Pt scheduled to d/c today. No PSA needed. Pt at [facility] less than 24 hours. PA/MD notified that pt must d/c by 10:30AM.   Transportation Means: sister  Supports: sister/some Radio producer at [employer], [employer]

## 2012-08-04 NOTE — Progress Notes (Signed)
Pt is resting in bed with eyes closed. RR WNL, even and unlabored. Level III obs in place. Pt remains safe. Franklin [name]

## 2012-08-04 NOTE — Progress Notes (Signed)
D) Pt being discharged to his home accompanied by his girlfriend. Pt was pressured to leave so that he could return keys to his boss by 1300 today. Affect and mood are appropriate. Denies SI and HI A) Belongings returned to Pt. Pt given support, reassurance and praise. Is aware of his follow up appointments at [facility]. All medications explained to Pt. R) Denies SI and HI. States "I am leaving in here in the best shape I have ever left in. I am glad for my medications. I need to be on them".

## 2012-08-04 NOTE — Progress Notes (Signed)
[facility] Adult Case Management Discharge Plan :  Will you be returning to the same living situation after discharge: Yes,  home with sister At discharge, do you have transportation home?:Yes,  sister Do you have the ability to pay for your medications:Yes,  mental health  Release of information consent forms completed and in the chart;  Patient's signature needed at discharge.  Patient to Follow up at: Follow-up Information   Follow up with [facility]. (Walk in between 8am-9am for hospital followup/medication management.)    Contact information:   [address], [place] [address] phone: [phone] fax: [phone]      Patient denies SI/HI:   Yes,  in group/self report.    Safety Planning and Suicide Prevention discussed:  Yes,  SPE completed with pt's sister. SPI pamphlet provided to pt and he was encouraged to ask questions/talk about any concerns.   Smart, Ulis Kaps 08/04/2012, 10:15 AM

## 2012-08-04 NOTE — Progress Notes (Signed)
Patient ID: Franklin [name], male   DOB: [birth year], 42 y.o.   MRN: [id number] D: Patient has been in his room most of the evening. Pt stated he is "doing well". Pt was disoriented when he came by to get his medications. Pt mood/affect is depressed with a flat affect. Pt denies any s/s of withdrawals. Pt denies SI/HI/AVH and pain. Pt attended evening karaoke group and was supportive of peers. Pt denies any needs or concerns.  Cooperative with assessment. No acute distressed noted at this time.   A: Met with pt 1:1. Medications administered as prescribed. Writer encouraged pt to discuss feelings. Pt encouraged to come to staff with any question or concerns. 15 minutes checks for safety.  R: Patient remains safe. He is complaint with medications and denies any adverse reaction. Continue current POC.

## 2012-08-04 NOTE — [facility] Suicide Risk Assessment (Signed)
Suicide Risk Assessment  Discharge Assessment     Demographic Factors:  Male, Adolescent or young adult, Low socioeconomic status and Unemployed  Mental Status Per Nursing Assessment::   On Admission:     Current Mental Status by Physician: NA  Loss Factors: Financial problems/change in socioeconomic status  Historical Factors: Family history of mental illness or substance abuse and Impulsivity  Risk Reduction Factors:   Sense of responsibility to family, Religious beliefs about death, Living with another person, especially a relative, Positive social support and Positive therapeutic relationship  Continued Clinical Symptoms:  Alcohol/Substance Abuse/Dependencies  Cognitive Features That Contribute To Risk:  Closed-mindedness Polarized thinking    Suicide Risk:  Minimal: No identifiable suicidal ideation.  Patients presenting with no risk factors but with morbid ruminations; may be classified as minimal risk based on the severity of the depressive symptoms  Discharge Diagnoses:   AXIS I:  Substance Induced Mood Disorder and Polysubstance dependence AXIS II:  Deferred AXIS III:   Past Medical History  Diagnosis Date  . Depression   . Anxiety   . Bipolar 1 disorder   . No pertinent past medical history   . Schizophrenia    AXIS IV:  economic problems, occupational problems, other psychosocial or environmental problems and problems related to social environment AXIS V:  61-70 mild symptoms  Plan Of Care/Follow-up recommendations:  Activity:  as tolerated Diet:  regular  Is patient on multiple antipsychotic therapies at discharge:  No   Has Patient had three or more failed trials of antipsychotic monotherapy by history:  No  Recommended Plan for Multiple Antipsychotic Therapies: Not applicable  Neema Barreira,JANARDHAHA R. 08/04/2012, 9:49 AM

## 2012-08-04 NOTE — Tx Team (Signed)
Interdisciplinary Treatment Plan Update (Adult)  Date: 08/04/2012   Time Reviewed: 10:12 AM  Progress in Treatment:  Attending groups: Yes Participating in groups:   Yes Taking medication as prescribed: Yes  Tolerating medication: Yes  Family/Significant othe contact made: Yes, SPE completed with pt's sister.  Patient understands diagnosis: Yes, AEB seeking treatment for SI, AH, ETOH detox, and medication stabilization.  Discussing patient identified problems/goals with staff: Yes  Medical problems stabilized or resolved: Yes  Denies suicidal/homicidal ideation: Yes, in group/self report. Pt no longer endorsing AH.  Patient has not harmed self or Others: Yes  New problem(s) identified: n/a  Discharge Plan or Barriers: Pt plans to followup at [facility] for med management and therapy (once monthly). He is d/cing today in order to go to [place] for work obligations. Per MD/PA, pt stable for d/c.  Additional comments: Franklin [name] is an 42 y.o. male who presents to the ED with SI with auditory voices with command. CSW and NP met with pt at bedside to complete assessment. Patient reports SI, with voices telling patient, "You might as well die, you should try again". Patient reports history of SI attempt by hanging self 2 years ago. Patient reports that sometimes he has an plan of harming himself but other times he doesn't. Patient reports thinking about hanging himself to try again. Pt reports that he wants to get back on medication. Patient reports that he would like to go to an inpatient stay and participate in treatment and get back on medication. Patient unable to contract for safety at this time. Pt referred to further assessment and evaluation. Patient reports that he is feeling overwhelmed and lost his job due to feeling overwhelmed. Patient unable to identify current stressors besides feeling overwhelmed. Patient reports he currently lives with his mother, and has a strong support system in mother  and sister. Pt reports drinking 12 beers per day for the past few months, and cocaine $200-$300 per day over the weekend. Patient reports that he has not taken medications and not followed up with [facility].  Reason for Continuation of Hospitalization: none. d/c today Estimated length of stay: d/c today For review of initial/current patient goals, please see plan of care.  Attendees:  Patient:    Family:    Physician: Aggie PA 08/04/2012 10:12 AM   Nursing: Philippa Chester RN 08/04/2012 10:11 AM   Clinical Social Worker [employer], LCSWA  08/04/2012 10:12 AM   Other:    Other:    Other: Darden Dates Nurse CM 08/04/2012 10:12 AM   Other:    Scribe for Treatment Team:  [employer] LCSWA 08/04/2012 10:12 AM

## 2012-08-04 NOTE — [facility] Suicide Risk Assessment (Signed)
[facility] INPATIENT:  Family/Significant Other Suicide Prevention Education  Suicide Prevention Education:  Education Completed; Seward Grater (patient's sister) has been identified by the patient as the family member/significant other with whom the patient will be residing, and identified as the person(s) who will aid the patient in the event of a mental health crisis (suicidal ideations/suicide attempt).  With written consent from the patient, the family member/significant other has been provided the following suicide prevention education, prior to the and/or following the discharge of the patient.  The suicide prevention education provided includes the following:  Suicide risk factors  Suicide prevention and interventions  National Suicide Hotline telephone number  [facility] assessment telephone number  [facility] Emergency Assistance 911  [facility] and/or Residential Mobile Crisis Unit telephone number  Request made of family/significant other to:  Remove weapons (e.g., guns, rifles, knives), all items previously/currently identified as safety concern.    Remove drugs/medications (over-the-counter, prescriptions, illicit drugs), all items previously/currently identified as a safety concern.  The family member/significant other verbalizes understanding of the suicide prevention education information provided.  The family member/significant other agrees to remove the items of safety concern listed above.  Smart, Rasheedah Reis 08/04/2012, 10:08 AM

## 2012-08-04 NOTE — Discharge Summary (Signed)
Physician Discharge Summary Note  Patient:  Franklin [name] is an 42 y.o., male MRN:  [id number] DOB:  [birth year] Patient phone:  [phone] (home)  Patient address:   [address] [place] [address],   Date of Admission:  08/03/2012 Date of Discharge: 08/04/12  Reason for Admission:  Hallucinations, increased depression  Discharge Diagnoses: Active Problems:   * No active hospital problems. *  Review of Systems  Constitutional: Negative.   HENT: Negative.   Eyes: Negative.   Respiratory: Negative.   Cardiovascular: Negative.   Gastrointestinal: Negative.   Genitourinary: Negative.   Musculoskeletal: Negative.   Skin: Negative.   Neurological: Negative.   Endo/Heme/Allergies: Negative.   Psychiatric/Behavioral: Positive for depression (Currently on medication to stabilize) and substance abuse (Polysbstance abuse/dependence). Negative for suicidal ideas, hallucinations and memory loss. The patient is nervous/anxious (Currently on medication to stabilize symptoms) and has insomnia (Currently on medication to stabilize).    Axis Diagnosis:   AXIS I:  Polysubstance dependence, Bipolar I disorder, most recent episode (or current) depressed, unspecified AXIS II:  Deferred AXIS III:   Past Medical History  Diagnosis Date  . Depression   . Anxiety   . Bipolar 1 disorder   . No pertinent past medical history   . Schizophrenia    AXIS IV:  Substance dependence, chronic mental illness AXIS V:  60  Level of Care:  OP  Hospital Course:  42 Y/O male who was admitted to our unit in April of this year. States after he left he did well for a while. 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 "just colapsed" Got overhwlemed, with loneliness and the depression, got worst ,anxiety kicked in started hearing voices. Voices told him to hurt himself. He states this past Friday he got so down with the voices that he relapsed on crack. Using every day. Also drinking 12 to 20 beers a day.  States bieng n the truck by himself did not help. He was not going to meetings the only interaction was with Franklin [name] over the phone.  Franklin [name] stay in this hospital was rather very brief. Although with toxicology reports showing blood alcohol level of 0 and (+) cocaine on the UDS reports, patient was showing some withdrawal symptoms upon arrival on the unit . As a result, he was ordered to receive Librium detoxification treatment protocol to combat any withdrawal symptoms being presented. However, he did require mood stabilization and crisis management to bring him back to his most possible stable state. He was then ordered and received, Wellbutrin XL 150 mg daily for depression, Neurontin 100 mg three time daily for anxiety and Trazodone 100 mg Q bedtime for sleep. He presented no other medical issues and or concerns that require management and or monitoring.  Patient came to the providers this am and asked to be discharged to his home. He was informed that he is yet to complete his detoxification treatment protocol. Adds adds that she is feeling better emotionally and physically, and stable to be discharged to his home. He denies any withdrawal symptoms of alcohol and or any other substances. He adamantly denies any suicidal homicidal ideations, auditory, visual hallucinations, delusional thoughts and or or feelings of paranoia. He will follow-up at the [facility] here in [place], [place]. He is instructed that this is a walk-in appointment between the hours of 08:00 and 09:00 am. The address, date, time and contact information for this clinic provided for patient in writing. He left [facility]  with all personal belongings family transport in no apparent distress.   Consults:  psychiatry  Significant Diagnostic Studies:  labs: CBC with diff, CMP, UDS, Toxicology tests, U/A  Discharge Vitals:   Blood pressure 124/86, pulse 71, temperature 97.2 F (36.2 C), temperature source Oral, resp. rate 18, height 5'  10" (1.778 m), weight 72.576 kg (160 lb), SpO2 94.00%. Body mass index is 22.96 kg/(m^2). Lab Results:   No results found for this or any previous visit (from the past 72 hour(s)).  Physical Findings: AIMS: Facial and Oral Movements Muscles of Facial Expression: None, normal Lips and Perioral Area: None, normal Jaw: None, normal Tongue: None, normal,Extremity Movements Upper (arms, wrists, hands, fingers): None, normal Lower (legs, knees, ankles, toes): None, normal, Trunk Movements Neck, shoulders, hips: None, normal, Overall Severity Severity of abnormal movements (highest score from questions above): None, normal Incapacitation due to abnormal movements: None, normal Patient's awareness of abnormal movements (rate only patient's report): No Awareness, Dental Status Current problems with teeth and/or dentures?: No Does patient usually wear dentures?: No  CIWA:  CIWA-Ar Total: 1 COWS:  COWS Total Score: 2  Psychiatric Specialty Exam: See Psychiatric Specialty Exam and Suicide Risk Assessment completed by Attending Physician prior to discharge.  Discharge destination:  Home  Is patient on multiple antipsychotic therapies at discharge:  No   Has Patient had three or more failed trials of antipsychotic monotherapy by history:  No  Recommended Plan for Multiple Antipsychotic Therapies: NA     Medication List       Indication   buPROPion 150 MG 24 hr tablet  Commonly known as:  WELLBUTRIN XL  Take 1 tablet (150 mg total) by mouth daily. For depression   Indication:  Major Depressive Disorder     gabapentin 100 MG capsule  Commonly known as:  NEURONTIN  Take 2 capsules (200 mg total) by mouth 3 (three) times daily. For anxiety/pain management   Indication:  Agitation, Alcohol Withdrawal Syndrome, Pain, Anxiety     traZODone 100 MG tablet  Commonly known as:  DESYREL  Take 1 tablet (100 mg total) by mouth at bedtime as needed for sleep. For sleep   Indication:  Trouble  Sleeping       Follow-up Information   Follow up with [facility]. (Walk in between 8am-9am for hospital followup/medication management.)    Contact information:   [address], [place] [address] phone: [phone] fax: [phone]    Follow-up recommendations: Activity:  As tolerated Diet: As recommended by your primary care doctor. Keep all scheduled follow-up appointments as recommended.  Comments:  Take all your medications as prescribed by your mental healthcare provider. Report any adverse effects and or reactions from your medicines to your outpatient provider promptly. Patient is instructed and cautioned to not engage in alcohol and or illegal drug use while on prescription medicines. In the event of worsening symptoms, patient is instructed to call the crisis hotline, 911 and or go to the nearest ED for appropriate evaluation and treatment of symptoms. Follow-up with your primary care provider for your other medical issues, concerns and or health care needs.   Total Discharge Time:  Greater than 30 minutes.  SignedSanjuana Kava, PMHNP-BC 08/06/2012, 3:24 PM  Patient was seen and evaluated for suicide risk assessment, formulated discharge plans and case discussed with physician extender.Reviewed the information documented and agree with the treatment plan.  Nehemiah Settle., M.D. 08/08/2012 12:41 PM

## 2012-08-09 NOTE — Progress Notes (Signed)
Patient Discharge Instructions:  After Visit Summary (AVS):   Faxed to:  08/09/12 Discharge Summary Note:   Faxed to:  08/09/12 Psychiatric Admission Assessment Note:   Faxed to:  08/09/12 Suicide Risk Assessment - Discharge Assessment:   Faxed to:  08/09/12 Faxed/Sent to the Next Level Care provider:  08/09/12 Faxed to [facility] @ [phone]  Jerelene Redden, 08/09/2012, 3:14 PM

## 2012-08-25 ENCOUNTER — Emergency Department: Payer: Self-pay | Admitting: Emergency Medicine

## 2012-08-25 LAB — CBC
HCT: 45.6 % (ref 40.0–52.0)
HGB: 15.3 g/dL (ref 13.0–18.0)
MCH: 30 pg (ref 26.0–34.0)
MCHC: 33.5 g/dL (ref 32.0–36.0)
MCV: 89 fL (ref 80–100)
Platelet: 320 10*3/uL (ref 150–440)
RBC: 5.1 10*6/uL (ref 4.40–5.90)
RDW: 15 % — ABNORMAL HIGH (ref 11.5–14.5)
WBC: 6.3 10*3/uL (ref 3.8–10.6)

## 2012-08-25 LAB — COMPREHENSIVE METABOLIC PANEL
Albumin: 4.2 g/dL (ref 3.4–5.0)
Alkaline Phosphatase: 81 U/L (ref 50–136)
Anion Gap: 4 — ABNORMAL LOW (ref 7–16)
BUN: 10 mg/dL (ref 7–18)
Bilirubin,Total: 0.7 mg/dL (ref 0.2–1.0)
Calcium, Total: 8.9 mg/dL (ref 8.5–10.1)
Chloride: 104 mmol/L (ref 98–107)
Co2: 30 mmol/L (ref 21–32)
Creatinine: 0.83 mg/dL (ref 0.60–1.30)
EGFR (African American): >60
EGFR (Non-African Amer.): >60
Glucose: 127 mg/dL — ABNORMAL HIGH (ref 65–99)
Osmolality: 276 (ref 275–301)
Potassium: 3.7 mmol/L (ref 3.5–5.1)
SGOT(AST): 34 U/L (ref 15–37)
SGPT (ALT): 30 U/L (ref 12–78)
Sodium: 138 mmol/L (ref 136–145)
Total Protein: 7.7 g/dL (ref 6.4–8.2)

## 2012-08-25 LAB — URINALYSIS, COMPLETE
Bilirubin,UR: NEGATIVE
Blood: NEGATIVE
Glucose,UR: NEGATIVE mg/dL (ref 0–75)
Leukocyte Esterase: NEGATIVE
Nitrite: NEGATIVE
Ph: 5 (ref 4.5–8.0)
Protein: 30
RBC,UR: 17 /HPF (ref 0–5)
Specific Gravity: 1.025 (ref 1.003–1.030)
Squamous Epithelial: NONE SEEN
WBC UR: 1 /HPF (ref 0–5)

## 2012-08-25 LAB — DRUG SCREEN, URINE
Amphetamines, Ur Screen: NEGATIVE (ref ?–1000)
Barbiturates, Ur Screen: NEGATIVE (ref ?–200)
Benzodiazepine, Ur Scrn: NEGATIVE (ref ?–200)
Cannabinoid 50 Ng, Ur ~~LOC~~: NEGATIVE (ref ?–50)
Cocaine Metabolite,Ur ~~LOC~~: POSITIVE (ref ?–300)
MDMA (Ecstasy)Ur Screen: NEGATIVE (ref ?–500)
Methadone, Ur Screen: NEGATIVE (ref ?–300)
Opiate, Ur Screen: NEGATIVE (ref ?–300)
Phencyclidine (PCP) Ur S: NEGATIVE (ref ?–25)
Tricyclic, Ur Screen: NEGATIVE (ref ?–1000)

## 2012-08-25 LAB — ETHANOL
Ethanol %: 0.019 % (ref 0.000–0.080)
Ethanol: 19 mg/dL

## 2012-08-25 LAB — TSH: Thyroid Stimulating Horm: 0.776 u[IU]/mL

## 2013-03-13 ENCOUNTER — Emergency Department (HOSPITAL_COMMUNITY)
Admission: EM | Admit: 2013-03-13 | Discharge: 2013-03-14 | Disposition: A | Discharge status: Other Acute Inpatient Hospital | Payer: Self-pay | Attending: Emergency Medicine | Admitting: Emergency Medicine

## 2013-03-13 ENCOUNTER — Encounter (HOSPITAL_COMMUNITY): Payer: Self-pay | Admitting: Emergency Medicine

## 2013-03-13 DIAGNOSIS — N489 Disorder of penis, unspecified: Secondary | ICD-10-CM | POA: Insufficient documentation

## 2013-03-13 DIAGNOSIS — F3130 Bipolar disorder, current episode depressed, mild or moderate severity, unspecified: Secondary | ICD-10-CM | POA: Insufficient documentation

## 2013-03-13 DIAGNOSIS — R109 Unspecified abdominal pain: Secondary | ICD-10-CM | POA: Insufficient documentation

## 2013-03-13 DIAGNOSIS — Z79899 Other long term (current) drug therapy: Secondary | ICD-10-CM | POA: Insufficient documentation

## 2013-03-13 DIAGNOSIS — R443 Hallucinations, unspecified: Secondary | ICD-10-CM | POA: Insufficient documentation

## 2013-03-13 DIAGNOSIS — F1010 Alcohol abuse, uncomplicated: Secondary | ICD-10-CM | POA: Insufficient documentation

## 2013-03-13 DIAGNOSIS — F17200 Nicotine dependence, unspecified, uncomplicated: Secondary | ICD-10-CM | POA: Insufficient documentation

## 2013-03-13 DIAGNOSIS — F1910 Other psychoactive substance abuse, uncomplicated: Secondary | ICD-10-CM

## 2013-03-13 DIAGNOSIS — R32 Unspecified urinary incontinence: Secondary | ICD-10-CM | POA: Insufficient documentation

## 2013-03-13 DIAGNOSIS — Z8659 Personal history of other mental and behavioral disorders: Secondary | ICD-10-CM | POA: Insufficient documentation

## 2013-03-13 DIAGNOSIS — F1410 Cocaine abuse, uncomplicated: Secondary | ICD-10-CM | POA: Insufficient documentation

## 2013-03-13 DIAGNOSIS — F411 Generalized anxiety disorder: Secondary | ICD-10-CM | POA: Insufficient documentation

## 2013-03-13 LAB — URINALYSIS, ROUTINE W REFLEX MICROSCOPIC
Glucose, UA: NEGATIVE mg/dL
Hgb urine dipstick: NEGATIVE
Ketones, ur: NEGATIVE mg/dL
Leukocytes, UA: NEGATIVE
Nitrite: NEGATIVE
Protein, ur: NEGATIVE mg/dL
Specific Gravity, Urine: 1.026 (ref 1.005–1.030)
Urobilinogen, UA: 0.2 mg/dL (ref 0.0–1.0)
pH: 5 (ref 5.0–8.0)

## 2013-03-13 LAB — COMPREHENSIVE METABOLIC PANEL
ALT: 16 U/L (ref 0–53)
AST: 24 U/L (ref 0–37)
Albumin: 4 g/dL (ref 3.5–5.2)
Alkaline Phosphatase: 85 U/L (ref 39–117)
BUN: 9 mg/dL (ref 6–23)
CO2: 24 mEq/L (ref 19–32)
Calcium: 9.3 mg/dL (ref 8.4–10.5)
Chloride: 99 mEq/L (ref 96–112)
Creatinine, Ser: 0.82 mg/dL (ref 0.50–1.35)
GFR calc Af Amer: >90 mL/min (ref >90)
GFR calc non Af Amer: >90 mL/min (ref >90)
Glucose, Bld: 180 mg/dL — ABNORMAL HIGH (ref 70–99)
Potassium: 4.3 mEq/L (ref 3.7–5.3)
Sodium: 140 mEq/L (ref 137–147)
Total Bilirubin: 0.3 mg/dL (ref 0.3–1.2)
Total Protein: 7.4 g/dL (ref 6.0–8.3)

## 2013-03-13 LAB — RAPID URINE DRUG SCREEN, HOSP PERFORMED
Amphetamines: NOT DETECTED
Barbiturates: NOT DETECTED
Benzodiazepines: NOT DETECTED
Cocaine: POSITIVE — AB
Opiates: NOT DETECTED
Tetrahydrocannabinol: NOT DETECTED

## 2013-03-13 LAB — ETHANOL: Alcohol, Ethyl (B): 11 mg/dL (ref 0–11)

## 2013-03-13 LAB — CBC
HCT: 46.3 % (ref 39.0–52.0)
Hemoglobin: 16.5 g/dL (ref 13.0–17.0)
MCH: 30.4 pg (ref 26.0–34.0)
MCHC: 35.6 g/dL (ref 30.0–36.0)
MCV: 85.3 fL (ref 78.0–100.0)
Platelets: 309 10*3/uL (ref 150–400)
RBC: 5.43 MIL/uL (ref 4.22–5.81)
RDW: 14.1 % (ref 11.5–15.5)
WBC: 5.8 10*3/uL (ref 4.0–10.5)

## 2013-03-13 MED ORDER — VITAMIN B-1 100 MG PO TABS
100.0000 mg | ORAL_TABLET | Freq: Every day | ORAL | Status: DC
  Administered 2013-03-13: 100 mg via ORAL
  Filled 2013-03-13: qty 1

## 2013-03-13 MED ORDER — LORAZEPAM 1 MG PO TABS: 0.0000 mg | ORAL_TABLET | Freq: Two times a day (BID) | ORAL | Status: DC

## 2013-03-13 MED ORDER — ONDANSETRON HCL 4 MG PO TABS
4.0000 mg | ORAL_TABLET | Freq: Three times a day (TID) | ORAL | Status: DC | PRN
  Administered 2013-03-13: 4 mg via ORAL
  Filled 2013-03-13: qty 1

## 2013-03-13 MED ORDER — NICOTINE 21 MG/24HR TD PT24
21.0000 mg | MEDICATED_PATCH | Freq: Every day | TRANSDERMAL | Status: DC
  Administered 2013-03-13: 21 mg via TRANSDERMAL
  Filled 2013-03-13: qty 1

## 2013-03-13 MED ORDER — THIAMINE HCL 100 MG/ML IJ SOLN: 100.0000 mg | Freq: Every day | INTRAMUSCULAR | Status: DC

## 2013-03-13 MED ORDER — IBUPROFEN 200 MG PO TABS
600.0000 mg | ORAL_TABLET | Freq: Three times a day (TID) | ORAL | Status: DC | PRN
  Administered 2013-03-13: 600 mg via ORAL
  Filled 2013-03-13: qty 3

## 2013-03-13 MED ORDER — LORAZEPAM 1 MG PO TABS
0.0000 mg | ORAL_TABLET | Freq: Four times a day (QID) | ORAL | Status: DC
Start: 2013-03-13 — End: 2013-03-14
  Administered 2013-03-13: 2 mg via ORAL
  Filled 2013-03-13: qty 2

## 2013-03-13 MED ORDER — ALUM & MAG HYDROXIDE-SIMETH 200-200-20 MG/5ML PO SUSP: 30.0000 mL | ORAL | Status: DC | PRN

## 2013-03-13 NOTE — ED Notes (Addendum)
Pt requesting detox from etoh and crack.  States he is hearing voices and this happens when he goes through detox.  No hx of detox seizures.  Last drink was about 0600 this morning.  In a normal day, pt drinks a 1/5 of liquor and a case of beer.  Last did crack about 0600 this morning.  Denies other drugs.  Denies SI/HI.  Also c/o low abd pain, penis pain.  States it hurts when he urinates x 1 month.  States that sometimes "there's tissue in it".

## 2013-03-13 NOTE — ED Provider Notes (Signed)
CSN: [id number]     Arrival date & time 03/13/13  1332 History  This chart was scribed for non-physician practitioner working with Ethelda Chick, MD by Ashley Jacobs, ED scribe. This patient was seen in room WTR4/WLPT4 and the patient's care was started at 2:05 PM.   First MD Initiated Contact with Patient 03/13/13 1403     Chief Complaint  Patient presents with  . Medical Clearance     (Consider location/radiation/quality/duration/timing/severity/associated sxs/prior Treatment) The history is provided by the patient and medical records. No language interpreter was used.   HPI Comments: Franklin [name] is a 43 y.o. male who presents to the Emergency Department for Medical Clearance. Pt requests detox from alcohol and crack cocaine. He uses drugs intermittently and recently started using again. He typically drinks 1/5 of liquor and 12 pk of beer per day. In addition he uses $100 worth of crack cocaine a day. Denies vomiting and diarrhea. His last use was this morning. He complains of penis/abdominal pain and urinary incontinence when he urinates ("unable to turn it off"). Denies penile discharge and dysuria. Pt is currently sexually active. He states that he is hearing voices but it is non directive. Pt has mental "cloudiness" and reports feeling depressed. He has hx of depression and "mood disorder". This is not pt first time undergoing detox. Denies hx of seizures with detox. Denies SI/HI.   Past Medical History  Diagnosis Date  . Depression   . Anxiety   . Bipolar 1 disorder   . No pertinent past medical history   . Schizophrenia    Past Surgical History  Procedure Laterality Date  . No past surgeries     Family History  Problem Relation Age of Onset  . Hypertension Other   . Diabetes Other    History  Substance Use Topics  . Smoking status: Current Every Day Smoker -- 1.00 packs/day    Types: Cigarettes  . Smokeless tobacco: Never Used  . Alcohol Use: 7.2 oz/week    12  Cans of beer per week     Comment: heavy 12 pk/day    Review of Systems  Gastrointestinal: Positive for abdominal pain. Negative for vomiting and diarrhea.  Genitourinary: Negative for dysuria, discharge and penile pain.       Urinary incontinence    Neurological: Negative for seizures.  Psychiatric/Behavioral: Positive for hallucinations (hearing voices) and dysphoric mood.      Allergies  Review of patient's allergies indicates no known allergies.  Home Medications   Current Outpatient Rx  Name  Route  Sig  Dispense  Refill  . buPROPion (WELLBUTRIN XL) 150 MG 24 hr tablet   Oral   Take 150 mg by mouth daily.         Marland Kitchen gabapentin (NEURONTIN) 100 MG capsule   Oral   Take 2 capsules (200 mg total) by mouth 3 (three) times daily. For anxiety/pain management   180 capsule   0   . traZODone (DESYREL) 100 MG tablet   Oral   Take 1 tablet (100 mg total) by mouth at bedtime as needed for sleep. For sleep   30 tablet   0    BP 122/79  Pulse 99  Temp(Src) 98.4 F (36.9 C) (Oral)  Resp 16  SpO2 100% Physical Exam  Nursing note and vitals reviewed. Constitutional: He appears well-developed and well-nourished.  HENT:  Head: Normocephalic and atraumatic.  Eyes: Conjunctivae are normal. Right eye exhibits no discharge. Left eye exhibits no discharge.  Neck: Normal range of motion. Neck supple.  Cardiovascular: Normal rate, regular rhythm and normal heart sounds.   Pulmonary/Chest: Effort normal and breath sounds normal.  Abdominal: Soft. There is no tenderness.  Neurological: He is alert.  Skin: Skin is warm and dry.  Psychiatric: He exhibits a depressed mood.    ED Course  Procedures (including critical care time) DIAGNOSTIC STUDIES: Oxygen Saturation is 100% on room air, normal by my interpretation.    COORDINATION OF CARE:  2:11 PM Discussed course of care with pt . Pt understands and agrees.   Labs Review Labs Reviewed  COMPREHENSIVE METABOLIC PANEL -  Abnormal; Notable for the following:    Glucose, Bld 180 (*)    All other components within normal limits  URINE RAPID DRUG SCREEN (HOSP PERFORMED) - Abnormal; Notable for the following:    Cocaine POSITIVE (*)    All other components within normal limits  URINALYSIS, ROUTINE W REFLEX MICROSCOPIC - Abnormal; Notable for the following:    Bilirubin Urine SMALL (*)    All other components within normal limits  GC/CHLAMYDIA PROBE AMP  CBC  ETHANOL   Imaging Review No results found.   EKG Interpretation None      Vital signs reviewed and are as follows: Filed Vitals:   03/13/13 1759  BP: 115/68  Pulse: 100  Temp:   Resp:   BP 115/68  Pulse 100  Temp(Src) 98 F (36.7 C) (Oral)  Resp 16  SpO2 99%  Holding orders completed. CIWA ordered. Awaiting TTS eval.   Pt is medically cleared. UA neg. Urine sent for GC/chlamydia probe.   MDM   Final diagnoses:  Polysubstance abuse    Pending TTS eval.   I personally performed the services described in this documentation, which was scribed in my presence. The recorded information has been reviewed and is accurate.     Renne CriglerJoshua Maclean Foister, PA-C 03/13/13 2027

## 2013-03-13 NOTE — Progress Notes (Signed)
[facility] by Karleen HampshireSpencer PA to the services of Dr. Addison NaegeliJonalagadda; room 506.2.

## 2013-03-13 NOTE — ED Notes (Signed)
Called EDP r/t pt.'s b/p.  EDP to order pt.'s home b/p meds.

## 2013-03-13 NOTE — Progress Notes (Signed)
[facility] provided pt with a [facility] Orange Card application, highlighting [facility], to help patient establish primary care.  °

## 2013-03-13 NOTE — ED Notes (Signed)
Patient observed resting in bed. Denies SI, HI, AVH. Reports anxiety and depression around 8/10. States that he is having abdominal pain/cramping with clamminess.  Encouragement offered. Given Motrin, Zofran, and Ativan.  Patient safety maintained, Q 15 checks continue.

## 2013-03-13 NOTE — ED Notes (Signed)
Pt. States that he came to ED for ETOH/drug detox, states that he drank a 5th and 1 case of beer last night and last used cocaine last night.  Pt. Also states that he is having prostrate problems with urine freq at night. When asked if pt. Has a hx. Of seizures with detox, he states that he does not.

## 2013-03-13 NOTE — BH Assessment (Signed)
Assessment Note  Franklin [name] is an 43 y.o. male. Patient presents to [facility] seeking detox from alcohol and help with his cocaine abuse, increasing depression and to be restarted on medications.  Patient states that he has been sober for about a year until he fell off the wagon 2 weeks ago due to the increased pressures of "life." Patient endorses passive SI with feelings of hopelessness, feelings of worthlessness, self-pity, insomnia, decreased appetite and guilt. Patient has a history of suicide attempt via hanging several years ago and continues to have bouts of depression with SI. Patient also states that he needs to be started back on medications to help him with his auditory hallucinations (currently mumbling). Patient has been off medications for 2 months.  Patient is in need of inpatient hospitalization.  Axis I: Alcohol Abuse, Bipolar, Depressed and Cocaine Abuse Axis II: Deferred Axis III:  Past Medical History  Diagnosis Date  . Depression   . Anxiety   . Bipolar 1 disorder   . No pertinent past medical history   . Schizophrenia    Axis IV: economic problems, housing problems, other psychosocial or environmental problems and problems related to social environment Axis V: 41-50 serious symptoms  Past Medical History:  Past Medical History  Diagnosis Date  . Depression   . Anxiety   . Bipolar 1 disorder   . No pertinent past medical history   . Schizophrenia     Past Surgical History  Procedure Laterality Date  . No past surgeries      Family History:  Family History  Problem Relation Age of Onset  . Hypertension Other   . Diabetes Other     Social History:  reports that he has been smoking Cigarettes.  He has been smoking about 1.00 pack per day. He has never used smokeless tobacco. He reports that he drinks about 7.2 ounces of alcohol per week. He reports that he uses illicit drugs (Marijuana and "Crack" cocaine).  Additional Social History:  Alcohol / Drug  Use Pain Medications: Na Prescriptions: Na Over the Counter: Na History of alcohol / drug use?: Yes Longest period of sobriety (when/how long): 7 years of sobriety 5 years ago Negative Consequences of Use: Financial;Legal;Personal relationships;Work / School Withdrawal Symptoms: Anorexia;Irritability;Tremors;Change in blood pressure;Patient aware of relationship between substance abuse and physical/medical complications Substance #1 Name of Substance 1: Alcohol 1 - Age of First Use: 15 1 - Amount (size/oz): 1/5th of liquor/ Case of beer 1 - Frequency: Daily 1 - Duration: 2 weeks 1 - Last Use / Amount: 0600 this AM/ 1/5th liquor Substance #2 Name of Substance 2: Cocaine 2 - Age of First Use: 19 2 - Amount (size/oz): $100 2 - Frequency: Daily 2 - Duration: 2 weeks 2 - Last Use / Amount: This AM  CIWA: CIWA-Ar BP: 115/68 mmHg Pulse Rate: 100 (apical) Nausea and Vomiting: mild nausea with no vomiting Tactile Disturbances: none Tremor: no tremor Auditory Disturbances: not present Paroxysmal Sweats: barely perceptible sweating, palms moist Visual Disturbances: not present Anxiety: no anxiety, at ease Headache, Fullness in Head: none present Agitation: normal activity Orientation and Clouding of Sensorium: oriented and can do serial additions CIWA-Ar Total: 2 COWS:    Allergies: No Known Allergies  Home Medications:  (Not in a hospital admission)  OB/GYN Status:  No LMP for male patient.  General Assessment Data Location of Assessment: [facility] ED Is this a Tele or Face-to-Face Assessment?: Face-to-Face Is this an Initial Assessment or a Re-assessment for this encounter?:  Initial Assessment Living Arrangements: Parent (Mother) Can pt return to current living arrangement?: Yes Admission Status: Voluntary Is patient capable of signing voluntary admission?: Yes Transfer from: Home Referral Source: MD  Medical Screening Exam [facility] Walk-in ONLY) Medical Exam completed:  Yes  [facility] Crisis Care Plan Living Arrangements: Parent (Mother) Name of Psychiatrist: None Name of Therapist: None  Education Status Is patient currently in school?: No Current Grade: Na Highest grade of school patient has completed: 2 years college Name of school: [employer] person: [place] Plan Mother  Risk to self Suicidal Ideation: No Suicidal Intent: No Is patient at risk for suicide?: No Suicidal Plan?: No Access to Means: No What has been your use of drugs/alcohol within the last 12 months?: Daily Previous Attempts/Gestures: Yes How many times?:  (Once) Other Self Harm Risks:  (None) Triggers for Past Attempts: Other (Comment) (Depression) Intentional Self Injurious Behavior: None Family Suicide History: No Recent stressful life event(s): Other (Comment) ("Life") Persecutory voices/beliefs?: No Depression: Yes Depression Symptoms: Insomnia;Feeling worthless/self pity (hopelessness) Substance abuse history and/or treatment for substance abuse?: Yes Suicide prevention information given to non-admitted patients: Not applicable  Risk to Others Homicidal Ideation: No Thoughts of Harm to Others: No Current Homicidal Intent: No Current Homicidal Plan: No Access to Homicidal Means: No Identified Victim: Na History of harm to others?: No Assessment of Violence: None Noted Violent Behavior Description: Na Does patient have access to weapons?: No Criminal Charges Pending?: No Does patient have a court date: No  Psychosis Hallucinations: Auditory (mumbling) Delusions: None noted  Mental Status Report Appear/Hygiene: Other (Comment) (WNL) Eye Contact: Good Motor Activity: Freedom of movement;Unremarkable Speech: Logical/coherent Level of Consciousness: Alert Mood: Depressed;Helpless;Worthless, low self-esteem Affect: Appropriate to circumstance Anxiety Level: None Thought Processes: Coherent;Relevant Judgement: Unimpaired Orientation:  Person;Place;Time;Situation Obsessive Compulsive Thoughts/Behaviors: None  Cognitive Functioning Concentration: Normal Memory: Recent Intact;Remote Intact IQ: Average Insight: Good Impulse Control: Fair Appetite: Fair Weight Loss:  (None oted) Weight Gain:  (None noted) Sleep: No Change Total Hours of Sleep:  (4 hours) Vegetative Symptoms: None  ADLScreening [facility] Assessment Services) Patient's cognitive ability adequate to safely complete daily activities?: Yes Patient able to express need for assistance with ADLs?: Yes Independently performs ADLs?: Yes (appropriate for developmental age)  Prior Inpatient Therapy Prior Inpatient Therapy: Yes Prior Therapy Dates: 7/14 Prior Therapy Facilty/Provider([facility]): [facility] Reason for Treatment: SI; Detox  Prior Outpatient Therapy Prior Outpatient Therapy: No Prior Therapy Dates: Na Prior Therapy Facilty/Provider(s): Na Reason for Treatment: Na  ADL Screening (condition at time of admission) Patient's cognitive ability adequate to safely complete daily activities?: Yes Is the patient deaf or have difficulty hearing?: No Does the patient have difficulty seeing, even when wearing glasses/contacts?: No Does the patient have difficulty concentrating, remembering, or making decisions?: No Patient able to express need for assistance with ADLs?: Yes Does the patient have difficulty dressing or bathing?: No Independently performs ADLs?: Yes (appropriate for developmental age) Does the patient have difficulty walking or climbing stairs?: No Weakness of Legs: None Weakness of Arms/Hands: None  Home Assistive Devices/Equipment Home Assistive Devices/Equipment: None  Therapy Consults (therapy consults require a physician order) PT Evaluation Needed: No OT Evalulation Needed: No SLP Evaluation Needed: No Abuse/Neglect Assessment (Assessment to be complete while patient is alone) Physical Abuse: Denies Verbal Abuse: Denies Sexual Abuse:  Denies Exploitation of patient/patient's resources: Denies Self-Neglect: Denies Values / Beliefs Cultural Requests During Hospitalization: None Spiritual Requests During Hospitalization: None Consults Spiritual Care Consult Needed: No Social Work Consult Needed: No Advance  Directives (For Healthcare) Advance Directive: Patient does not have advance directive;Patient would not like information    Additional Information 1:1 In Past 12 Months?: No CIRT Risk: No Elopement Risk: No Does patient have medical clearance?: Yes     Disposition:  Disposition Initial Assessment Completed for this Encounter: Yes Disposition of Patient: Inpatient treatment program Type of inpatient treatment program: Adult  On Site Evaluation by:   Reviewed with Physician:    Rudi CocoMILLER, Casimiro Lienhard M 03/13/2013 8:40 PM

## 2013-03-14 ENCOUNTER — Inpatient Hospital Stay (HOSPITAL_COMMUNITY)
Admission: AD | Admit: 2013-03-14 | Discharge: 2013-03-18 | DRG: 885 | Disposition: A | Discharge status: Home / Self Care | Payer: Federal, State, Local not specified - Other | Source: Intra-hospital | Attending: Psychiatry | Admitting: Psychiatry

## 2013-03-14 ENCOUNTER — Encounter (HOSPITAL_COMMUNITY): Payer: Self-pay | Admitting: Behavioral Health

## 2013-03-14 DIAGNOSIS — R45851 Suicidal ideations: Secondary | ICD-10-CM

## 2013-03-14 DIAGNOSIS — F209 Schizophrenia, unspecified: Secondary | ICD-10-CM | POA: Diagnosis present

## 2013-03-14 DIAGNOSIS — F1410 Cocaine abuse, uncomplicated: Secondary | ICD-10-CM | POA: Diagnosis present

## 2013-03-14 DIAGNOSIS — F1210 Cannabis abuse, uncomplicated: Secondary | ICD-10-CM | POA: Diagnosis present

## 2013-03-14 DIAGNOSIS — F411 Generalized anxiety disorder: Secondary | ICD-10-CM | POA: Diagnosis present

## 2013-03-14 DIAGNOSIS — F3130 Bipolar disorder, current episode depressed, mild or moderate severity, unspecified: Principal | ICD-10-CM | POA: Diagnosis present

## 2013-03-14 DIAGNOSIS — G4700 Insomnia, unspecified: Secondary | ICD-10-CM | POA: Diagnosis present

## 2013-03-14 DIAGNOSIS — F1910 Other psychoactive substance abuse, uncomplicated: Secondary | ICD-10-CM

## 2013-03-14 DIAGNOSIS — F1994 Other psychoactive substance use, unspecified with psychoactive substance-induced mood disorder: Secondary | ICD-10-CM | POA: Diagnosis present

## 2013-03-14 DIAGNOSIS — Z833 Family history of diabetes mellitus: Secondary | ICD-10-CM

## 2013-03-14 DIAGNOSIS — Z9119 Patient's noncompliance with other medical treatment and regimen: Secondary | ICD-10-CM

## 2013-03-14 DIAGNOSIS — F17200 Nicotine dependence, unspecified, uncomplicated: Secondary | ICD-10-CM | POA: Diagnosis present

## 2013-03-14 DIAGNOSIS — F1020 Alcohol dependence, uncomplicated: Secondary | ICD-10-CM | POA: Diagnosis present

## 2013-03-14 DIAGNOSIS — Z91199 Patient's noncompliance with other medical treatment and regimen due to unspecified reason: Secondary | ICD-10-CM

## 2013-03-14 MED ORDER — BUPROPION HCL ER (XL) 150 MG PO TB24
150.0000 mg | ORAL_TABLET | Freq: Every day | ORAL | Status: DC
  Administered 2013-03-15 – 2013-03-18 (×4): 150 mg via ORAL
  Filled 2013-03-14 (×7): qty 1

## 2013-03-14 MED ORDER — ALUM & MAG HYDROXIDE-SIMETH 200-200-20 MG/5ML PO SUSP: 30.0000 mL | ORAL | Status: DC | PRN

## 2013-03-14 MED ORDER — TRAZODONE HCL 100 MG PO TABS
100.0000 mg | ORAL_TABLET | Freq: Every evening | ORAL | Status: DC | PRN
  Administered 2013-03-16: 100 mg via ORAL
  Filled 2013-03-14: qty 1

## 2013-03-14 MED ORDER — CHLORDIAZEPOXIDE HCL 25 MG PO CAPS
25.0000 mg | ORAL_CAPSULE | Freq: Three times a day (TID) | ORAL | Status: DC | PRN
  Administered 2013-03-14 (×2): 25 mg via ORAL
  Filled 2013-03-14 (×2): qty 1

## 2013-03-14 MED ORDER — HYDROXYZINE HCL 25 MG PO TABS
25.0000 mg | ORAL_TABLET | Freq: Four times a day (QID) | ORAL | Status: DC | PRN
  Administered 2013-03-14: 25 mg via ORAL
  Filled 2013-03-14: qty 1

## 2013-03-14 MED ORDER — ACETAMINOPHEN 325 MG PO TABS
650.0000 mg | ORAL_TABLET | Freq: Four times a day (QID) | ORAL | Status: DC | PRN
  Administered 2013-03-14 – 2013-03-16 (×3): 650 mg via ORAL
  Filled 2013-03-14 (×3): qty 2

## 2013-03-14 MED ORDER — NICOTINE 21 MG/24HR TD PT24
21.0000 mg | MEDICATED_PATCH | Freq: Every day | TRANSDERMAL | Status: DC
  Administered 2013-03-15 – 2013-03-16 (×2): 21 mg via TRANSDERMAL
  Filled 2013-03-14 (×7): qty 1

## 2013-03-14 MED ORDER — MAGNESIUM HYDROXIDE 400 MG/5ML PO SUSP
30.0000 mL | Freq: Every day | ORAL | Status: DC | PRN
Start: 2013-03-14 — End: 2013-03-18

## 2013-03-14 MED ORDER — GABAPENTIN 100 MG PO CAPS
200.0000 mg | ORAL_CAPSULE | Freq: Three times a day (TID) | ORAL | Status: DC
  Administered 2013-03-14 – 2013-03-18 (×13): 200 mg via ORAL
  Filled 2013-03-14 (×19): qty 2

## 2013-03-14 NOTE — Progress Notes (Signed)
D: Patient in the bed on approach.  Patient states he ahs been in bed all day.  Patient states he is going though alcohol withdrawal.  Patient states he thinks he will be able to get up tomorrow.  Patient states he us depressed.  Patient denies SI/HI and denies AVH. A: Staff to monitor Q 15 mins for safety.  Encouragement and support offered.  No scheduled medications administered per orders.  Librium administered prn for withdrawal symptoms. R: Patient remains safe on the unit.  Patient did not attend group tonight.  Patient taking administered medications.  Patient not visible on the unit and not interacting with peers.

## 2013-03-14 NOTE — Progress Notes (Signed)
D:Pt is in his bed refusing groups today and refusing to go to cafeteria. Pt reports that he has generalized pain with detox and mild nausea. Pt reports that he plans to go to groups tomorrow when he feels better. Pt reports that he has auditory hallucination and cannot understand what they are saying. A:Offered support and 15 minute checks. Gave Gingerale for nausea and prn tylenol for generalized pain. Dinner being served to pt on the unit. R:Pt denies si and hi. Safety maintained on the unit.

## 2013-03-14 NOTE — Progress Notes (Signed)
Adult Psychoeducational Group Note  Date:  03/14/2013 Time:  10:00am Group Topic/Focus:  Personal Choices and Values:   The focus of this group is to help patients assess and explore the importance of values in their lives, how their values affect their decisions, how they express their values and what opposes their expression.  Participation Level:  Did Not Attend  Participation Quality:    Affect:   Cognitive:    Insight:   Engagement in Group:    Modes of Intervention:    Additional Comments:  Pt did not attend  Pryor CuriaGarner, Avaree Gilberti D 03/14/2013, 6:54 PM

## 2013-03-14 NOTE — Tx Team (Signed)
Interdisciplinary Treatment Plan Update   Date Reviewed:  03/14/2013  Time Reviewed:  9:31 AM  Progress in Treatment:   Attending groups: Yes Participating in groups: Yes Taking medication as prescribed: Yes  Tolerating medication: Yes Family/Significant other contact made:  No, but will ask patient for consent for collateral contact Patient understands diagnosis: Yes  Discussing patient identified problems/goals with staff: Yes Medical problems stabilized or resolved: Yes Denies suicidal/homicidal ideation: Yes Patient has not harmed self or others: Yes  For review of initial/current patient goals, please see plan of care.  Estimated Length of Stay:  3-5 days  Reasons for Continued Hospitalization:  Anxiety Depression Medication stabilization Suicidal ideation  New Problems/Goals identified:    Discharge Plan or Barriers:   Home with outpatient follow up to be determined  Additional Comments:  Franklin [name] is an 43 y.o. male. [facility] detox from alcohol and help with his cocaine abuse, increasing depression and to be restarted on medications. Patient states that he has been sober for about a year until he fell off the wagon 2 weeks ago due to the increased pressures of "life." Patient endorses passive SI with feelings of hopelessness, feelings of worthlessness, self-pity, insomnia, decreased appetite and guilt. Patient has a history of suicide attempt via hanging several years ago and continues to have bouts of depression with SI. Patient also states that he needs to be started back on medications to help him with his auditory hallucinations (currently mumbling). Patient has been off medications for 2 months.   Attendees:  Patient:  03/14/2013 9:31 AM   Signature: Mervyn GayJ. Jonnalagadda, MD 03/14/2013 9:31 AM  Signature:   03/14/2013 9:31 AM  Signature:  Claudette Headonrad Withrow, NP 03/14/2013 9:31 AM  Signature:   03/14/2013 9:31 AM  Signature:  Robbie LouisVivian Kent, RN 03/14/2013  9:31 AM  Signature:  Juline PatchQuylle Armon Orvis, LCSW 03/14/2013 9:31 AM  Signature:  Reyes Ivanhelsea Horton, LCSW 03/14/2013 9:31 AM  Signature:  Leisa LenzValerie Enoch, Care Coordinator [facility] 03/14/2013 9:31 AM  Signature:  03/14/2013 9:31 AM  Signature: Leighton ParodyBritney Tyson, RN 03/14/2013  9:31 AM  Signature:   Onnie BoerJennifer Clark, RN [facility] 03/14/2013  9:31 AM  Signature:   03/14/2013  9:31 AM    Scribe for Treatment Team:   Juline PatchQuylle Tamirah George,  03/14/2013 9:31 AM

## 2013-03-14 NOTE — Progress Notes (Signed)
Patient states that he is upset to be aroused at an early hour to be transferred. Reports this is the first time in two weeks he has been able to get sleep. States that he feels it rude to make transfers at this time.  Encouragement offered. Patient informed that he needs to be transferred and assessed at this time. Lavena StanfordKeneshia, [facility] made aware of patient concerns.  Patient safety maintained, Q 15 checks continue.

## 2013-03-14 NOTE — H&P (Signed)
Psychiatric Admission Assessment Adult  Patient Identification:  Franklin [name] Date of Evaluation:  03/14/2013 Chief Complaint:  BIPOLAR DISORDER  History of Present Illness: Patient was seen and chart reviewed. Patient has multiple psychiatric hospitalization for substance abuse including alcohol and cocaine and bipolar disorder. 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 Patient requested detox from alcohol and help with his cocaine abuse, increasing depression and to be restarted on medications which he has been discontinued more than 6 months ago since he was discharged from the lithium at the hospital. Patient reported he did not have any followup with her medical provider. Patient states that he has been sober for about a year until he fell off the wagon 2 weeks ago due to the increased pressures of "life." Patient endorses passive SI with feelings of hopelessness, feelings of worthlessness, self-pity, insomnia, decreased appetite and guilt. Patient has a history of suicide attempt via hanging several years ago and continues to have bouts of depression with suicide ideation. Patient has been seeking for medication management for detox treatment and depression and anxiety. Patient reported he has been having auditory hallucinations (currently mumbling) secondary to cocaine intoxication.   Elements:  Location:  Depression, hallucinations and substance abuse. Quality:  Suicidal ideation. Severity:  Noncompliant with medication and relapsed after drug of abuse. Timing:  2 weeks. Duration:  Unable to tolerate his life stressors. Associated Signs/Synptoms: Depression Symptoms:  depressed mood, anhedonia, psychomotor retardation, fatigue, feelings of worthlessness/guilt, hopelessness, recurrent thoughts of death, anxiety, loss of  energy/fatigue, decreased labido, decreased appetite, (Hypo) Manic Symptoms:  Distractibility, Impulsivity, Irritable Mood, Anxiety Symptoms:  Excessive Worry, Psychotic Symptoms:  Hallucinations: Auditory PTSD Symptoms: NA Total Time spent with patient: 45 minutes  Psychiatric Specialty Exam: Physical Exam Full physical performed in Emergency Department. I have reviewed this assessment and concur with its findings.   Review of Systems  Musculoskeletal: Positive for myalgias.  Psychiatric/Behavioral: Positive for depression, suicidal ideas and substance abuse. The patient is nervous/anxious and has insomnia.   All other systems reviewed and are negative.    Blood pressure 112/68, pulse 66, temperature 97.5 F (36.4 C), temperature source Oral, resp. rate 18, height $RemoveBe'5\' 10"'QVixPzoUP$  (1.778 m), weight 75.297 kg (166 lb), SpO2 98.00%.Body mass index is 23.82 kg/(m^2).  General Appearance: Guarded  Eye Contact::  Fair  Speech:  Clear and Coherent  Volume:  Decreased  Mood:  Anxious, Depressed, Hopeless, Irritable and Worthless  Affect:  Depressed and Flat  Thought Process:  Goal Directed and Intact  Orientation:  Full (Time, Place, and Person)  Thought Content:  Hallucinations: Auditory, Paranoid Ideation and Rumination  Suicidal Thoughts:  Yes.  without intent/plan  Homicidal Thoughts:  No  Memory:  Immediate;   Fair  Judgement:  Fair  Insight:  Fair  Psychomotor Activity:  Psychomotor Retardation and Restlessness  Concentration:  NA  Recall:  Good  Fund of Knowledge:Fair  Language: Good  Akathisia:  NA  Handed:  Right  AIMS (if indicated):     Assets:  Communication Skills Desire for Improvement Financial Resources/Insurance Leisure Time Physical Health Resilience Social Support Transportation  Sleep:  Number of Hours: 4.5    Musculoskeletal: Strength & Muscle Tone: within normal limits Gait & Station: normal Patient leans: N/A  Past Psychiatric History: Diagnosis:   Hospitalizations:  Outpatient Care:  Substance Abuse Care:  Self-Mutilation:  Suicidal Attempts:  Violent  Behaviors:   Past Medical History:   Past Medical History  Diagnosis Date  . Depression   . Anxiety   . Bipolar 1 disorder   . No pertinent past medical history   . Schizophrenia    None. Allergies:  No Known Allergies PTA Medications: No prescriptions prior to admission    Previous Psychotropic Medications:  Medication/Dose                 Substance Abuse History in the last 12 months:  yes  Consequences of Substance Abuse: NA  Social History:  reports that he has been smoking Cigarettes.  He has been smoking about 1.00 pack per day. He has never used smokeless tobacco. He reports that he drinks about 7.2 ounces of alcohol per week. He reports that he uses illicit drugs (Marijuana and "Crack" cocaine). Additional Social History: Pain Medications: na Prescriptions: na Over the Counter: na History of alcohol / drug use?: Yes Longest period of sobriety (when/how long): 7 years  Negative Consequences of Use: Legal;Financial;Work / School;Personal relationships Withdrawal Symptoms: Tremors;Patient aware of relationship between substance abuse and physical/medical complications;Cramps Name of Substance 1: etoh 1 - Age of First Use: 15 1 - Amount (size/oz): i case of beer and 1/5 of liquor 1 - Frequency: daily 1 - Duration: 2 weeks 1 - Last Use / Amount: 03/13/2013 at 0600 1/5 of liquor and a 12 pack of beer Name of Substance 2: cocaine 2 - Age of First Use: 19 2 - Amount (size/oz): 100 dollars worth 2 - Frequency: daily 2 - Duration: 2 weeks 2 - Last Use / Amount: 03/13/2013 at 0600                Current Place of Residence:   Place of Birth:   Family Members: Marital Status:  Single Children:  Sons:  Daughters: Relationships: Education:  [employer] Problems/Performance: Religious Beliefs/Practices: History of Abuse  (Emotional/Phsycial/Sexual) Occupational Experiences; Military History:  None. Legal History: Hobbies/Interests:  Family History:   Family History  Problem Relation Age of Onset  . Hypertension Other   . Diabetes Other     Results for orders placed during the hospital encounter of 03/13/13 (from the past 72 hour(s))  CBC     Status: None   Collection Time    03/13/13  1:51 PM      Result Value Ref Range   WBC 5.8  4.0 - 10.5 K/uL   RBC 5.43  4.22 - 5.81 MIL/uL   Hemoglobin 16.5  13.0 - 17.0 g/dL   HCT 09.2  88.0 - 13.2 %   MCV 85.3  78.0 - 100.0 fL   MCH 30.4  26.0 - 34.0 pg   MCHC 35.6  30.0 - 36.0 g/dL   RDW 72.0  50.8 - 46.1 %   Platelets 309  150 - 400 K/uL  COMPREHENSIVE METABOLIC PANEL     Status: Abnormal   Collection Time    03/13/13  1:51 PM      Result Value Ref Range   Sodium 140  137 - 147 mEq/L   Potassium 4.3  3.7 - 5.3 mEq/L   Chloride 99  96 - 112 mEq/L   CO2 24  19 - 32 mEq/L   Glucose, Bld 180 (*) 70 - 99 mg/dL   BUN 9  6 - 23 mg/dL   Creatinine, Ser 7.82  0.50 - 1.35 mg/dL   Calcium 9.3  8.4 - 09.3 mg/dL   Total Protein 7.4  6.0 - 8.3 g/dL   Albumin 4.0  3.5 - 5.2 g/dL   AST 24  0 - 37 U/L   ALT 16  0 - 53 U/L   Alkaline Phosphatase 85  39 - 117 U/L   Total Bilirubin 0.3  0.3 - 1.2 mg/dL   GFR calc non Af Amer >90  >90 mL/min   GFR calc Af Amer >90  >90 mL/min   Comment: (NOTE)     The eGFR has been calculated using the CKD EPI equation.     This calculation has not been validated in all clinical situations.     eGFR's persistently <90 mL/min signify possible Chronic Kidney     Disease.  ETHANOL     Status: None   Collection Time    03/13/13  1:51 PM      Result Value Ref Range   Alcohol, Ethyl (B) 11  0 - 11 mg/dL   Comment:            LOWEST DETECTABLE LIMIT FOR     SERUM ALCOHOL IS 11 mg/dL     FOR MEDICAL PURPOSES ONLY  URINE RAPID DRUG SCREEN (HOSP PERFORMED)     Status: Abnormal   Collection Time    03/13/13  1:59 PM      Result  Value Ref Range   Opiates NONE DETECTED  NONE DETECTED   Cocaine POSITIVE (*) NONE DETECTED   Benzodiazepines NONE DETECTED  NONE DETECTED   Amphetamines NONE DETECTED  NONE DETECTED   Tetrahydrocannabinol NONE DETECTED  NONE DETECTED   Barbiturates NONE DETECTED  NONE DETECTED   Comment:            DRUG SCREEN FOR MEDICAL PURPOSES     ONLY.  IF CONFIRMATION IS NEEDED     FOR ANY PURPOSE, NOTIFY LAB     WITHIN 5 DAYS.                LOWEST DETECTABLE LIMITS     FOR URINE DRUG SCREEN     Drug Class       Cutoff (ng/mL)     Amphetamine      1000     Barbiturate      200     Benzodiazepine   476     Tricyclics       546     Opiates          300     Cocaine          300     THC              50  URINALYSIS, ROUTINE W REFLEX MICROSCOPIC     Status: Abnormal   Collection Time    03/13/13  2:20 PM      Result Value Ref Range   Color, Urine YELLOW  YELLOW   APPearance CLEAR  CLEAR   Specific Gravity, Urine 1.026  1.005 - 1.030   pH 5.0  5.0 - 8.0   Glucose, UA NEGATIVE  NEGATIVE mg/dL   Hgb urine dipstick NEGATIVE  NEGATIVE   Bilirubin Urine SMALL (*) NEGATIVE   Ketones, ur NEGATIVE  NEGATIVE mg/dL   Protein, ur NEGATIVE  NEGATIVE mg/dL   Urobilinogen, UA 0.2  0.0 - 1.0 mg/dL   Nitrite NEGATIVE  NEGATIVE   Leukocytes, UA NEGATIVE  NEGATIVE   Comment: MICROSCOPIC NOT DONE ON URINES WITH NEGATIVE PROTEIN, BLOOD, LEUKOCYTES, NITRITE, OR GLUCOSE <1000 mg/dL.   Psychological Evaluations:  Assessment:   DSM5:  Schizophrenia Disorders:   Obsessive-Compulsive Disorders:   Trauma-Stressor Disorders:   Substance/Addictive Disorders:  Alcohol Related Disorder - Severe (303.90) Depressive Disorders:  Major Depressive Disorder - Unspecified (296.20)  AXIS I:  Bipolar, Depressed, Substance Induced Mood Disorder and Cocaine intoxication and alcohol abuse AXIS II:  Deferred AXIS III:   Past Medical History  Diagnosis Date  . Depression   . Anxiety   . Bipolar 1 disorder   . No  pertinent past medical history   . Schizophrenia    AXIS IV:  other psychosocial or environmental problems, problems related to social environment and problems with primary support group AXIS V:  41-50 serious symptoms  Treatment Plan/Recommendations:   admitted for crisis stabilization in the safety monitoring and medication management   Treatment Plan Summary: Daily contact with patient to assess and evaluate symptoms and progress in treatment Medication management Current Medications:  Current Facility-Administered Medications  Medication Dose Route Frequency Provider Last Rate Last Dose  . acetaminophen (TYLENOL) tablet 650 mg  650 mg Oral Q6H PRN Laverle Hobby, PA-C   650 mg at 03/14/13 0603  . alum & mag hydroxide-simeth (MAALOX/MYLANTA) 200-200-20 MG/5ML suspension 30 mL  30 mL Oral Q4H PRN Laverle Hobby, PA-C      . buPROPion (WELLBUTRIN XL) 24 hr tablet 150 mg  150 mg Oral Daily Laverle Hobby, PA-C      . chlordiazePOXIDE (LIBRIUM) capsule 25 mg  25 mg Oral TID PRN Durward Parcel, MD      . gabapentin (NEURONTIN) capsule 200 mg  200 mg Oral TID Laverle Hobby, PA-C      . hydrOXYzine (ATARAX/VISTARIL) tablet 25 mg  25 mg Oral Q6H PRN Laverle Hobby, PA-C   25 mg at 03/14/13 0603  . magnesium hydroxide (MILK OF MAGNESIA) suspension 30 mL  30 mL Oral Daily PRN Laverle Hobby, PA-C      . nicotine (NICODERM CQ - dosed in mg/24 hours) patch 21 mg  21 mg Transdermal Daily Durward Parcel, MD      . traZODone (DESYREL) tablet 100 mg  100 mg Oral QHS PRN Laverle Hobby, PA-C        Observation Level/Precautions:  15 minute checks  Laboratory:  Review admission labs  Psychotherapy:  substance abuse counseling, supportive therapy and cognitive behavior therapy and a skill building training.    Medications:  Wellbutrin XL 150 mg daily for depression and trazodone 100 mg at bedtime for insomnia and hydroxyzine 25 mg every 6 hours for when necessary for anxiety  and Librium 25 mg 3 times a day as needed for alcohol withdrawal symptoms as per CIWA protocol    Consultations:   none   Discharge Concerns:   safety concerns  Estimated LOS: 4-7 days   Other:     I certify that inpatient services furnished can reasonably be expected to improve the patient's condition.   Jayleah Garbers,JANARDHAHA R. 3/11/201512:13 PM

## 2013-03-14 NOTE — Progress Notes (Signed)
Pt has been in bed all day today.  He refused his 0800 medications but was able to get up around 1230 took his gabapentin and requested a prn librium for his anxiety.  He rated his depression 9 hopelessness 9 and his anxiety 9 on his self-inventory.  Pt did not fill out his self-inventory but did answer the questions verbally.  He denied any S/H ideation or V/H he does admit to hearing voices "I don't know what they are saying"

## 2013-03-14 NOTE — ED Provider Notes (Signed)
Medical screening examination/treatment/procedure(s) were performed by non-physician practitioner and as supervising physician I was immediately available for consultation/collaboration.   EKG Interpretation None       Janathan Bribiesca K Linker, MD 03/14/13 1509 

## 2013-03-14 NOTE — Progress Notes (Signed)
43 y/o male who presents for depression, SI with no plan,insomnia and alcohol detox.  Patient states his last drink was 03/13/2013 at 0600.  Patient states drank (1) 12 pack of beer and 1/5th of liquor.  Patient state she lost his job 2 weeks ago and has been drinking heavily.  Patient states he was drunk before going to the hospital but states he slept out in the parking lot to be respectful and not to come into the hospital drunk. Patient states he is currently homeless living out of his car or staying with people at time.  Patient states he is passive SI but verbally contracts for safety.  Patient denies HI.  Patient states he has auditory hallucinations but states it is mumbles. Patient states he has a history of bipolar, schizophrenia.  Patient states he has been noncompliant with his medications for months.  Skin assessed and patient has tattoos on his neck.  Consents obtained, fall safety plan explained and patient verbalized understanding.  Patient escorted and oriented to the unit.  Fluids offered and patient accepted. Patient offered no additional questions or concerns.

## 2013-03-14 NOTE — [facility] Suicide Risk Assessment (Signed)
   Nursing information obtained from:  Patient Demographic factors:  Male;Unemployed;Low socioeconomic status Current Mental Status:  Suicidal ideation indicated by patient Loss Factors:  Financial problems / change in socioeconomic status;Legal issues Historical Factors:  Prior suicide attempts Risk Reduction Factors:  NA Total Time spent with patient: 45 minutes  CLINICAL FACTORS:   Depression:   Anhedonia Comorbid alcohol abuse/dependence Hopelessness Impulsivity Insomnia Recent sense of peace/wellbeing Severe Alcohol/Substance Abuse/Dependencies Previous Psychiatric Diagnoses and Treatments  Psychiatric Specialty Exam: Physical Exam  ROS  Blood pressure 112/68, pulse 66, temperature 97.5 F (36.4 C), temperature source Oral, resp. rate 18, height 5\' 10"  (1.778 m), weight 75.297 kg (166 lb), SpO2 98.00%.Body mass index is 23.82 kg/(m^2).  General Appearance: Disheveled  Eye SolicitorContact::  Fair  Speech:  Clear and Coherent  Volume:  Decreased  Mood:  Anxious, Depressed, Hopeless, Irritable and Worthless  Affect:  Depressed  Thought Process:  Goal Directed and Intact  Orientation:  Full (Time, Place, and Person)  Thought Content:  Hallucinations: Auditory  Suicidal Thoughts:  Yes.  without intent/plan  Homicidal Thoughts:  No  Memory:  Immediate;   Fair  Judgement:  Impaired  Insight:  Lacking  Psychomotor Activity:  Psychomotor Retardation and Restlessness  Concentration:  Fair  Recall:  F[employer]ervFair  Fund of Knowledge:Fair  Language: Good  Akathisia:  NA  Handed:  Right  AIMS (if indicated):     Assets:  Communication Skills Desire for Improvement Financial Resources/Insurance Housing Leisure Time Physical Health Resilience Social Support Transportation  Sleep:  Number of Hours: 4.5   Musculoskeletal: Strength & Muscle Tone: within normal limits Gait & Station: normal Patient leans: N/A  COGNITIVE FEATURES THAT CONTRIBUTE TO RISK:  Closed-mindedness Loss of  executive function Polarized thinking Thought constriction (tunnel vision)    SUICIDE RISK:   Moderate:  Frequent suicidal ideation with limited intensity, and duration, some specificity in terms of plans, no associated intent, good self-control, limited dysphoria/symptomatology, some risk factors present, and identifiable protective factors, including available and accessible social support.  PLAN OF CARE: Admitted for substance induced mood disorder with suicidal ideation, cocaine intoxication with auditory hallucinations and a history of bipolar disorder required crisis stabilization, safety monitoring and medication management  I certify that inpatient services furnished can reasonably be expected to improve the patient's condition.  Devorah Givhan,JANARDHAHA R. 03/14/2013, 12:10 PM

## 2013-03-14 NOTE — [facility] Group Notes (Signed)
[facility]   03/14/2013 10:36 AM  Participation Quality:  Did not attend group.  Hodnett, Franklin [name]

## 2013-03-14 NOTE — Progress Notes (Signed)
Psychoeducational Group Note  Date:  03/14/2013 Time:  8:00 p.m.   Group Topic/Focus:  Wrap-Up Group:   The focus of this group is to help patients review their daily goal of treatment and discuss progress on daily workbooks.  Participation Level: Did Not Attend  Participation Quality:  Not Applicable  Affect:  Not Applicable  Cognitive:  Not Applicable  Insight:  Not Applicable  Engagement in Group: Not Applicable  Additional Comments:  The patient refused to attend group this evening.   Hazle CocaGOODMAN, Marce Charlesworth S 03/14/2013, 10:54 PM

## 2013-03-14 NOTE — Tx Team (Signed)
Initial Interdisciplinary Treatment Plan  PATIENT STRENGTHS: (choose at least two) Communication skills General fund of knowledge  PATIENT STRESSORS: Financial difficulties Occupational concerns Substance abuse   PROBLEM LIST: Problem List/Patient Goals Date to be addressed Date deferred Reason deferred Estimated date of resolution  Substance abuse etoh, crack cocaine      Depression      Medication noncompliance                                           DISCHARGE CRITERIA:    PRELIMINARY DISCHARGE PLAN: Attend aftercare/continuing care group Placement in alternative living arrangements  PATIENT/FAMIILY INVOLVEMENT: This treatment plan has been presented to and reviewed with the patient, Prince SolianSteven Hietala, .  The patient and family have been given the opportunity to ask questions and make suggestions.  Angeline SlimHill, Theran Vandergrift M 03/14/2013, 6:16 AM

## 2013-03-14 NOTE — Clinical Social Work Note (Signed)
Writer attempted to meet with patient to complete PSA.  Patient was sleeping and asked if writer could follow up with him later.

## 2013-03-15 DIAGNOSIS — R45851 Suicidal ideations: Secondary | ICD-10-CM

## 2013-03-15 DIAGNOSIS — F1994 Other psychoactive substance use, unspecified with psychoactive substance-induced mood disorder: Secondary | ICD-10-CM

## 2013-03-15 DIAGNOSIS — F1410 Cocaine abuse, uncomplicated: Secondary | ICD-10-CM

## 2013-03-15 LAB — GC/CHLAMYDIA PROBE AMP
CT Probe RNA: NEGATIVE
GC Probe RNA: NEGATIVE

## 2013-03-15 NOTE — Progress Notes (Signed)
[facility] MD Progress Note  03/15/2013 2:11 PM Franklin [name]  MRN:  [id number] Subjective:  Patient has multiple psychiatric hospitalization for substance abuse including alcohol and cocaine and bipolar disorder.   Patient has been minimizing his symptoms of depression anxiety and substance abuse today. Patient reported he has been taking medication and has been participating in the unit activities including group therapies. Patient currently minimizes suicidal and homicidal ideation. Patient does not endorses again with the symptoms or craving for drugs. Patient does not have a disposition plan I'm not able to spend time with the case manager regarding psychosocial evaluation at this time. Patient has a history of suicide attempt via hanging several years ago and continues to have bouts of depression with suicide ideation. Patient has been seeking for medication management for detox treatment and depression and anxiety. Patient reported he has been having auditory hallucinations (currently mumbling) secondary to cocaine intoxication.   Diagnosis:   DSM5: Schizophrenia Disorders:   Obsessive-Compulsive Disorders:   Trauma-Stressor Disorders:   Substance/Addictive Disorders:   Depressive Disorders:   Total Time spent with patient: 30 minutes  Axis I: Bipolar, Depressed, Substance Induced Mood Disorder and Cocaine intoxication and alcohol abuse  ADL's:  Intact  Sleep: Fair  Appetite:  Fair  Suicidal Ideation:  Patient endorses suicidal ideation but contracts for safety while in the hospital Homicidal Ideation:  Denied AEB (as evidenced by):  Psychiatric Specialty Exam: Physical Exam  ROS  Blood pressure 128/86, pulse 94, temperature 97.9 F (36.6 C), temperature source Oral, resp. rate 20, height 5\' 10"  (1.778 m), weight 75.297 kg (166 lb), SpO2 98.00%.Body mass index is 23.82 kg/(m^2).  General Appearance: Guarded  Eye Contact::  Fair  Speech:  Clear and Coherent  Volume:  Decreased   Mood:  Anxious and Depressed  Affect:  Depressed and Flat  Thought Process:  Goal Directed and Intact  Orientation:  Full (Time, Place, and Person)  Thought Content:  Rumination  Suicidal Thoughts:  Yes.  without intent/plan  Homicidal Thoughts:  No  Memory:  Immediate;   Fair  Judgement:  Impaired  Insight:  Lacking  Psychomotor Activity:  Psychomotor Retardation  Concentration:  Fair  Recall:  Fair  Fund of Knowledge:Fair  Language: Good  Akathisia:  NA  Handed:  Right  AIMS (if indicated):     Assets:  Communication Skills Desire for Improvement Physical Health Resilience Social Support  Sleep:  Number of Hours: 6.25   Musculoskeletal: Strength & Muscle Tone: within normal limits Gait & Station: normal Patient leans: N/A  Current Medications: Current Facility-Administered Medications  Medication Dose Route Frequency Provider Last Rate Last Dose  . acetaminophen (TYLENOL) tablet 650 mg  650 mg Oral Q6H PRN Kerry Hough, PA-C   650 mg at 03/14/13 1716  . alum & mag hydroxide-simeth (MAALOX/MYLANTA) 200-200-20 MG/5ML suspension 30 mL  30 mL Oral Q4H PRN Kerry Hough, PA-C      . buPROPion (WELLBUTRIN XL) 24 hr tablet 150 mg  150 mg Oral Daily Kerry Hough, PA-C   150 mg at 03/15/13 0829  . chlordiazePOXIDE (LIBRIUM) capsule 25 mg  25 mg Oral TID PRN Nehemiah Settle, MD   25 mg at 03/14/13 2257  . gabapentin (NEURONTIN) capsule 200 mg  200 mg Oral TID Kerry Hough, PA-C   200 mg at 03/15/13 1152  . hydrOXYzine (ATARAX/VISTARIL) tablet 25 mg  25 mg Oral Q6H PRN Kerry Hough, PA-C   25 mg at 03/14/13 0603  .  magnesium hydroxide (MILK OF MAGNESIA) suspension 30 mL  30 mL Oral Daily PRN Kerry HoughSpencer E Simon, PA-C      . nicotine (NICODERM CQ - dosed in mg/24 hours) patch 21 mg  21 mg Transdermal Daily Nehemiah SettleJanardhaha R Chauntae Hults, MD   21 mg at 03/15/13 0827  . traZODone (DESYREL) tablet 100 mg  100 mg Oral QHS PRN Kerry HoughSpencer E Simon, PA-C        Lab Results:   Results for orders placed during the hospital encounter of 03/13/13 (from the past 48 hour(s))  URINALYSIS, ROUTINE W REFLEX MICROSCOPIC     Status: Abnormal   Collection Time    03/13/13  2:20 PM      Result Value Ref Range   Color, Urine YELLOW  YELLOW   APPearance CLEAR  CLEAR   Specific Gravity, Urine 1.026  1.005 - 1.030   pH 5.0  5.0 - 8.0   Glucose, UA NEGATIVE  NEGATIVE mg/dL   Hgb urine dipstick NEGATIVE  NEGATIVE   Bilirubin Urine SMALL (*) NEGATIVE   Ketones, ur NEGATIVE  NEGATIVE mg/dL   Protein, ur NEGATIVE  NEGATIVE mg/dL   Urobilinogen, UA 0.2  0.0 - 1.0 mg/dL   Nitrite NEGATIVE  NEGATIVE   Leukocytes, UA NEGATIVE  NEGATIVE   Comment: MICROSCOPIC NOT DONE ON URINES WITH NEGATIVE PROTEIN, BLOOD, LEUKOCYTES, NITRITE, OR GLUCOSE <1000 mg/dL.  GC/CHLAMYDIA PROBE AMP     Status: None   Collection Time    03/13/13  2:32 PM      Result Value Ref Range   CT Probe RNA NEGATIVE  NEGATIVE   GC Probe RNA NEGATIVE  NEGATIVE   Comment: (NOTE)                                                                                               **Normal Reference Range: Negative**          Assay performed using the Gen-Probe APTIMA COMBO2 (R) Assay.     Acceptable specimen types for this assay include APTIMA Swabs (Unisex,     endocervical, urethral, or vaginal), first void urine, and ThinPrep     liquid based cytology samples.     Performed at [facility]    Physical Findings: AIMS: Facial and Oral Movements Muscles of Facial Expression: None, normal Lips and Perioral Area: None, normal Jaw: None, normal Tongue: None, normal,Extremity Movements Upper (arms, wrists, hands, fingers): None, normal Lower (legs, knees, ankles, toes): None, normal, Trunk Movements Neck, shoulders, hips: None, normal, Overall Severity Severity of abnormal movements (highest score from questions above): None, normal Incapacitation due to abnormal movements: None, normal Patient's awareness of  abnormal movements (rate only patient's report): No Awareness, Dental Status Current problems with teeth and/or dentures?: No Does patient usually wear dentures?: No  CIWA:  CIWA-Ar Total: 1 COWS:  COWS Total Score: 1  Treatment Plan Summary: Daily contact with patient to assess and evaluate symptoms and progress in treatment Medication management  Plan: Treatment Plan/Recommendations:   1. Admit for crisis management and stabilization. 2. Medication management to reduce current symptoms to base line and improve  the patient's overall level of functioning. Continue Wellbutrin XL 150 mg daily for depression Continue Neurontin 200 mg 3 times daily for mood stabilization Continue nicotine patch 21 mg daily for tobacco smoking 3. Treat health problems as indicated. 4. Develop treatment plan to decrease risk of relapse upon discharge and to reduce the need for readmission. 5. Psycho-social education regarding relapse prevention and self care. 6. Health care follow up as needed for medical problems. 7. Restart home medications where appropriate. 8. this case will be discussed with in the morning with the treatment team and disposition plans will be made   Medical Decision Making Problem Points:  Established problem, worsening (2), New problem, with no additional work-up planned (3), Review of psycho-social stressors (1) and Self-limited or minor (1) Data Points:  Review or order clinical lab tests (1) Review or order medicine tests (1) Review of medication regiment & side effects (2) Review of new medications or change in dosage (2)  I certify that inpatient services furnished can reasonably be expected to improve the patient's condition.   Tonatiuh Mallon,JANARDHAHA R. 03/15/2013, 2:11 PM

## 2013-03-15 NOTE — Progress Notes (Signed)
D:  Patient's self inventory sheet, patient sleeps well, good appetite, normal energy level, good attention span.  Rated depression and anxiety #6, hopeless #3.  Denied withdrawals.   Denied SI.  Denied physical problems.  Plans to discharge home.  [facility]. A:  Medications administered per MD orders.  Emotional support and encouragement given patient. R:  Denied SI and HI.  Denied A/V hallucinations.  Will continue to monitor patient for safety with 15 minute checks.  Safety maintained.

## 2013-03-15 NOTE — [facility] Suicide Risk Assessment (Signed)
[facility] INPATIENT:  Family/Significant Other Suicide Prevention Education  Suicide Prevention Education:  Patient Refusal for Family/Significant Other Suicide Prevention Education: The patient Franklin [name] has refused to provide written consent for family/significant other to be provided Family/Significant Other Suicide Prevention Education during admission and/or prior to discharge.  Physician notified.  Wynn BankerHodnett, Dajana Gehrig Hairston 03/15/2013, 10:33 AM

## 2013-03-15 NOTE — [facility] Group Notes (Signed)
[facility]  Living a Balanced Life 1:15 - 2:30 PM  03/15/2013 4:17 PM  Type of Therapy:  Group Therapy  Participation Level:  Did Not Attend   Wynn BankerHodnett, Kaelan Amble Hairston 03/15/2013, 4:17 PM

## 2013-03-15 NOTE — Progress Notes (Signed)
Adult Psychoeducational Group Note  Date:  03/15/2013 Time:  10:21 AM  Group Topic/Focus:  Self Esteem Action Plan:   The focus of this group is to help patients create a plan to continue to build self-esteem after discharge.  Participation Level:  Did Not Attend  Elijio MilesMercer, Gayatri Teasdale N 03/15/2013, 10:21 AM

## 2013-03-15 NOTE — Progress Notes (Signed)
Patient ID: Franklin [name], male   DOB: [birth year], 43 y.o.   MRN: [id number] D: pt. In bed most this evening, except to make a phone call to sister. Pt. Reports the reason he's here "I fell back" (making reference to drinking). Pt. Notes he had been sober for a year before relapse. 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 Argumentative when encouraged to attend group. 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 Encouraged pt. To make the best of his stay by attending groups in hopes of gaining information that will help him to maintain sobriety. Staff will monitor q7515min for safety. R: Pt. Is safe on the unit, he did not attend group. Pt. Isolates to his room.

## 2013-03-15 NOTE — Progress Notes (Signed)
The focus of this group is to educate the patient on the purpose and policies of crisis stabilization and provide a format to answer questions about their admission.  The group details unit policies and expectations of patients while admitted.  Patient attended 0900 nurse education orientation group this morning.  Patient actively participated, appropriate affect, alert, appropriate insight and engagement.  Today patient will work on 3 goals for discharge.  

## 2013-03-15 NOTE — [facility] Counselor (Signed)
Adult Comprehensive Assessment  Patient ID: Franklin [name], male   DOB: [birth year], 43 y.o.   MRN: [id number]  Information Source: Information source: Patient  Current Stressors:  Educational / Learning stressors: None Employment / Job issues: None Family Relationships: None Surveyor, quantityinancial / Lack of resources (include bankruptcy): None Housing / Lack of housing: None Physical health (include injuries & life threatening diseases): None Social relationships: None Substance abuse: Patient reports going on alcohol and crack cocaine binges Bereavement / Loss: None  Living/Environment/Situation:  Living Arrangements: Alone Living conditions (as described by patient or guardian): Good How long has patient lived in current situation?: one year What is atmosphere in current home: Comfortable  Family History:  Marital status: Single Does patient have children?: Yes How many children?: 1 How is patient's relationship with their children?: Patient advised of having a good relationship with his 10843 year old soh  Childhood History:  By whom was/is the patient raised?: Both parents Additional childhood history information: Patient reports have a good childhood Description of patient's relationship with caregiver when they were a child: Good Patient's description of current relationship with people who raised him/her: Good with mother.  Father is deceased Does patient have siblings?: Yes Number of Siblings: 439 Description of patient's current relationship with siblings: Good relationships Did patient suffer any verbal/emotional/physical/sexual abuse as a child?: No Did patient suffer from severe childhood neglect?: No Has patient ever been sexually abused/assaulted/raped as an adolescent or adult?: No Was the patient ever a victim of a crime or a disaster?: No Witnessed domestic violence?: No Has patient been effected by domestic violence as an adult?: No  Education:  Learning disability?:  No  Employment/Work Situation:   Employment situation: Employed Where is patient currently employed?: [employer] How long has patient been employed?: Eight months Patient's job has been impacted by current illness: No What is the longest time patient has a held a job?: Two years 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  He reports a two week binge of drinking 12 pack of beer and a fifth of liquor daily.  He also reports using up to a gram of cocaine. If attempted suicide, did drugs/alcohol play a role in this?: No Alcohol/Substance Abuse Treatment Hx: Denies past history;Past Tx, Inpatient If yes, describe treatment: [facility] [place]  Has alcohol/substance abuse ever caused legal problems?: No  Social Support System:   Forensic psychologistatient's Community Support System: None Describe Community Support System: N/A Type of faith/religion: Ephriam KnucklesChristian How does patient's faith help to cope with current illness?: Chief Operating Officerrays  Leisure/Recreation:   Leisure and Hobbies: Time with son  Strengths/Needs:   What things does the patient do well?: Treats people the way he wants to be treated In what areas does patient struggle / problems for patient: Responsibility  Discharge Plan:   Does patient have access to transportation?: Yes Will patient be returning to same living situation after discharge?: Yes Currently receiving community mental health services: Yes (From Whom) Vesta Mixer([facility]  [place]) If no, would patient like referral for services when discharged?: No Does patient have financial barriers related to discharge  medications?: No  Summary/Recommendations:  Franklin [name] is a 43 year old African American male admitted with Substance Induced Mood Disorder.  He will benefit from crisis stabilization, evaluation for medication, psycho-education groups for coping skills development, group therapy and case management for discharge planning.     Franklin [name], Franklin [name]

## 2013-03-16 MED ORDER — ADULT MULTIVITAMIN W/MINERALS CH
1.0000 | ORAL_TABLET | Freq: Every day | ORAL | Status: DC
  Administered 2013-03-16 – 2013-03-18 (×3): 1 via ORAL
  Filled 2013-03-16 (×5): qty 1

## 2013-03-16 MED ORDER — ENSURE COMPLETE PO LIQD
237.0000 mL | Freq: Two times a day (BID) | ORAL | Status: DC
  Administered 2013-03-16 – 2013-03-18 (×2): 237 mL via ORAL

## 2013-03-16 NOTE — [facility] Group Notes (Signed)
[facility] LCSW Group Therapy  Feelings Around Relapse 1:15 - 2:30 PM  03/16/2013 12:57 PM  Type of Therapy:  Group Therapy  Participation Level:  Did Not Attend

## 2013-03-16 NOTE — [facility] Group Notes (Signed)
[facility]   03/16/2013 9:42 AM    Participation Quality:  Appropraite  Mood/Affect:  Appropriate  Depression Rating:  3  Anxiety Rating:  3  Thoughts of Suicide:  No  Will you contract for safety?   NA  Current AVH:  No  Plan for Discharge/Comments:  Patient attended discharge planning group and actively participated in group.  [facility]. CSW provided all participants with daily workbook.   Transportation Means: Patient has transportation.   Supports:  Patient has a support system.   Rylyn Zawistowski, Joesph JulyQuylle Hairston

## 2013-03-16 NOTE — Progress Notes (Signed)
Patient has been up in the dayroom watching tv and interacting with select peers. He attended group this evening and his only complaint was not being discharged today. He is concerned about his job and possibly losing it d/t being here Patient currently denies having pain, -si/hi/a/v hall. Support and encouragement offered, safety maintained on unit, will continue to monitor.

## 2013-03-16 NOTE — Tx Team (Addendum)
Interdisciplinary Treatment Plan Update   Date Reviewed:  03/16/2013  Time Reviewed:  8:35 AM  Progress in Treatment:   Attending groups: Yes Participating in groups: Yes Taking medication as prescribed: Yes  Tolerating medication: Yes Family/Significant other contact made:  No, patient declined collateral contact Patient understands diagnosis: Yes  Discussing patient identified problems/goals with staff: Yes Medical problems stabilized or resolved: Yes Denies suicidal/homicidal ideation: Yes Patient has not harmed self or others: Yes  For review of initial/current patient goals, please see plan of care.  Estimated Length of Stay:  2 - days  Discharge Sunday  Reasons for Continued Hospitalization:   New Problems/Goals identified:    Discharge Plan or Barriers:   Home with [facility]  Additional Comments:  Patient to program on [address]  Attendees:  Patient:  03/16/2013 8:35 AM   Signature: Mervyn GayJ. Jonnalagadda, MD 03/16/2013 8:35 AM  Signature:   03/16/2013 8:35 AM  Signature:  Lamount Crankerhris Judge, RN  03/16/2013 8:35 AM  Signature:   03/16/2013 8:35 AM  Signature:  03/16/2013 8:35 AM  Signature:  Juline PatchQuylle Maisen Schmit, LCSW 03/16/2013 8:35 AM  Signature:  Reyes Ivanhelsea Horton, LCSW 03/16/2013 8:35 AM  Signature:   03/16/2013 8:35 AM  Signature:  03/16/2013 8:35 AM  Signature:  03/16/2013  8:35 AM  Signature:    03/16/2013  8:35 AM  Signature:   03/16/2013  8:35 AM    Scribe for Treatment Team:   Juline PatchQuylle Amar Sippel,  03/16/2013 8:35 AM

## 2013-03-16 NOTE — Progress Notes (Signed)
NUTRITION ASSESSMENT  Pt identified as at risk on the Malnutrition Screen Tool  INTERVENTION: 1. Educated patient on the importance of nutrition and encouraged intake of food and beverages. 2. Multivitamin 1 tablet PO daily 3. Supplements: Ensure Complete BID  NUTRITION DIAGNOSIS: Altered GI function related to alcohol abuse as evidenced by pt report  Goal: Pt to meet >/= 90% of their estimated nutrition needs.  Monitor:  PO intake  Assessment:  Patient has multiple psychiatric hospitalization for substance abuse including alcohol and cocaine and bipolar disorder. 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 Patient requested detox from alcohol and help with his cocaine abuse and increasing depression.  Met with pt who reports fair appetite PTA, eating 2 meals/day. States his weight has been stable. Nausea he had earlier in admission has resolved. Requested Ensure Complete.    43 y.o. male  Height: Ht Readings from Last 1 Encounters:  03/14/13 _0  (1.778 m)    Weight: Wt Readings from Last 1 Encounters:  03/14/13 166 lb (75.297 kg)    Weight Hx: Wt Readings from Last 10 Encounters:  03/14/13 166 lb (75.297 kg)  08/03/12 160 lb (72.576 kg)  04/07/12 175 lb (79.379 kg)  03/23/11 189 lb (85.73 kg)    BMI:  Body mass index is 23.82 kg/(m^2). Pt meets criteria for normal based on current BMI.  Estimated Nutritional Needs: Kcal: 25-30 kcal/kg Protein: > 1 gram protein/kg Fluid: 1 ml/kcal  Diet Order: General Pt is also offered choice of unit snacks mid-morning and mid-afternoon.  Pt is eating as desired.   Lab results and medications reviewed.   Mikey College MS, [place], [employer] Pager [phone] After Hours Pager

## 2013-03-16 NOTE — Progress Notes (Signed)
Patient ID: Franklin [name], male   DOB: 1970-07-23, 43 y.o.   MRN: [id number] [facility] Note  03/16/2013 11:43 AM Franklin [name]  MRN:  [id number] Subjective:  Patient has multiple psychiatric hospitalization for substance abuse including alcohol and cocaine and bipolar disorder.   Patient has minimum participation in the unit and did not participate in group therapies and counseling. Patient asking to be discharged but not able to show clinical improvement. Patient seems to be craving for drugs and he has a high chance of relapsing prematurity discharged. Patient showing less motivation to work with his abuse problems and defiant with his treatment program. Patient was advised his treatment program includes inpatient groups and counseling for substance abuse. Patient will be programming and substance abuse unit for this weekend. Patient has been minimizing his symptoms of depression, anxiety and substance abuse today. Patient currently minimizes suicidal and homicidal ideation. Patient does not have a disposition plan I'm not able to spend time with the case manager regarding psychosocial evaluation at this time. Patient has a history of suicide attempt via hanging several years ago and continues to have bouts of depression with suicide ideation. Patient has been seeking for medication management for detox treatment and depression and anxiety. Patient reported he has been having auditory hallucinations (currently mumbling) secondary to cocaine intoxication.   Diagnosis:   DSM5: Schizophrenia Disorders:   Obsessive-Compulsive Disorders:   Trauma-Stressor Disorders:   Substance/Addictive Disorders:   Depressive Disorders:   Total Time spent with patient: 30 minutes  Axis I: Bipolar, Depressed, Substance Induced Mood Disorder and Cocaine intoxication and alcohol abuse  ADL's:  Intact  Sleep: Fair  Appetite:  Fair  Suicidal Ideation:  Patient endorses suicidal ideation but contracts for  safety while in the hospital Homicidal Ideation:  Denied AEB (as evidenced by):  Psychiatric Specialty Exam: Physical Exam  ROS  Blood pressure 112/78, pulse 96, temperature 97.6 F (36.4 C), temperature source Oral, resp. rate 16, height 5\' 10"  (1.778 m), weight 75.297 kg (166 lb), SpO2 98.00%.Body mass index is 23.82 kg/(m^2).  General Appearance: Guarded  Eye Contact::  Fair  Speech:  Clear and Coherent  Volume:  Decreased  Mood:  Anxious and Depressed  Affect:  Depressed and Flat  Thought Process:  Goal Directed and Intact  Orientation:  Full (Time, Place, and Person)  Thought Content:  Rumination  Suicidal Thoughts:  Yes.  without intent/plan  Homicidal Thoughts:  No  Memory:  Immediate;   Fair  Judgement:  Impaired  Insight:  Lacking  Psychomotor Activity:  Psychomotor Retardation  Concentration:  Fair  Recall:  Fair  Fund of Knowledge:Fair  Language: Good  Akathisia:  NA  Handed:  Right  AIMS (if indicated):     Assets:  Communication Skills Desire for Improvement Physical Health Resilience Social Support  Sleep:  Number of Hours: 6.25   Musculoskeletal: Strength & Muscle Tone: within normal limits Gait & Station: normal Patient leans: N/A  Current Medications: Current Facility-Administered Medications  Medication Dose Route Frequency Provider Last Rate Last Dose  . acetaminophen (TYLENOL) tablet 650 mg  650 mg Oral Q6H PRN Kerry HoughSpencer E Simon, PA-C   650 mg at 03/14/13 1716  . alum & mag hydroxide-simeth (MAALOX/MYLANTA) 200-200-20 MG/5ML suspension 30 mL  30 mL Oral Q4H PRN Kerry HoughSpencer E Simon, PA-C      . buPROPion (WELLBUTRIN XL) 24 hr tablet 150 mg  150 mg Oral Daily Kerry HoughSpencer E Simon, PA-C   150 mg at 03/16/13 [id number]  .  chlordiazePOXIDE (LIBRIUM) capsule 25 mg  25 mg Oral TID PRN Nehemiah Settle, MD   25 mg at 03/14/13 2257  . gabapentin (NEURONTIN) capsule 200 mg  200 mg Oral TID Kerry Hough, PA-C   200 mg at 03/16/13 0856  . hydrOXYzine  (ATARAX/VISTARIL) tablet 25 mg  25 mg Oral Q6H PRN Kerry Hough, PA-C   25 mg at 03/14/13 0603  . magnesium hydroxide (MILK OF MAGNESIA) suspension 30 mL  30 mL Oral Daily PRN Kerry Hough, PA-C      . nicotine (NICODERM CQ - dosed in mg/24 hours) patch 21 mg  21 mg Transdermal Daily Nehemiah Settle, MD   21 mg at 03/16/13 0857  . traZODone (DESYREL) tablet 100 mg  100 mg Oral QHS PRN Kerry Hough, PA-C        Lab Results:  No results found for this or any previous visit (from the past 48 hour(s)).  Physical Findings: AIMS: Facial and Oral Movements Muscles of Facial Expression: None, normal Lips and Perioral Area: None, normal Jaw: None, normal Tongue: None, normal,Extremity Movements Upper (arms, wrists, hands, fingers): None, normal Lower (legs, knees, ankles, toes): None, normal, Trunk Movements Neck, shoulders, hips: None, normal, Overall Severity Severity of abnormal movements (highest score from questions above): None, normal Incapacitation due to abnormal movements: None, normal Patient's awareness of abnormal movements (rate only patient's report): No Awareness, Dental Status Current problems with teeth and/or dentures?: No Does patient usually wear dentures?: No  CIWA:  CIWA-Ar Total: 1 COWS:  COWS Total Score: 1  Treatment Plan Summary: Daily contact with patient to assess and evaluate symptoms and progress in treatment Medication management  Plan: Treatment Plan/Recommendations:  1. Patient was encouraged to participate in unit program for substance abuse counseling at [address]  2. Medication management to reduce current symptoms to base line and improve the patient's overall level of functioning. Continue Wellbutrin XL 150 mg daily for depression Continue Neurontin 200 mg 3 times daily for mood stabilization Continue nicotine patch 21 mg daily for tobacco smoking 3. Treat health problems as indicated. 4. Develop treatment plan to decrease risk of  relapse upon discharge and to reduce the need for readmission. 5. Psycho-social education regarding relapse prevention and self care. 6. Health care follow up as needed for medical problems. 7. Restart home medications where appropriate. 8. Disposition plans are in progress and case discussed with the treatment team and encouraged patient to make use of substance abuse counseling and with medication management before may be appropriate disposition plan. Patient may discharged on Sunday if he continued to show clinical improvement and contracts for safety.   Medical Decision Making Problem Points:  Established problem, worsening (2), New problem, with no additional work-up planned (3), Review of psycho-social stressors (1) and Self-limited or minor (1) Data Points:  Review or order clinical lab tests (1) Review or order medicine tests (1) Review of medication regiment & side effects (2) Review of new medications or change in dosage (2)  I certify that inpatient services furnished can reasonably be expected to improve the patient's condition.   Krikor Willet,JANARDHAHA R. 03/16/2013, 11:43 AM

## 2013-03-16 NOTE — Progress Notes (Signed)
D) Pt upset today, due to not being allowed to go home. Has a job that he is working on on Monday and doesnot want anyone or anything to interfere with his job. Pt states "I have been herer a couple of times and do not, and will not go to the groups". Defiant with Dr. Shela CommonsJ today. States that only reason he comes here is to detox. "Detox to me is resting and not taking any drugs. I don't want to go to groups and I won't go. Denies SI and HI. Rates his depression and hopelessness both at a 5. A) Encouraged Pt to attend a group if he seems able to do it. R) Pt denies SI and HI. States that "I have detoxed before. I do not want to go to ay groups, because I have been to the groups in the past, I just want to be able to rest and eat my meals. I have done that and now I am ready to leave.

## 2013-03-17 NOTE — Progress Notes (Signed)
Psychoeducational Group Note  Date: 03/17/2013 Time:  1015  Group Topic/Focus:  Identifying Needs:   The focus of this group is to help patients identify their personal needs that have been historically problematic and identify healthy behaviors to address their needs.  Participation Level:  Did not attend Alexzandra Bilton A  

## 2013-03-17 NOTE — Progress Notes (Addendum)
.  Psychoeducational Group Note    Date: 03/17/2013 Time:  0930  Goal Setting Purpose of Group: To be able to set [name] goal that is measurable and that can be accomplished in one day Participation Level:  Did not attend Franklin [name] 

## 2013-03-17 NOTE — Progress Notes (Signed)
D) Pt continues to stay in his room and in his bed sleeping much of the day. Will not attend the programs and comes out of his room after the groups are done. Pt states, "when I detox I sleep. I don't go to  The groups because I have been to the groups many times. What I need to do is sleep and leave here so I can go to my job. Rates his depression at a 4 and his hopelessness at a 3. Denies SI and HI A) Given support. Encouraged to come to the groups just to try them again. Praised when appropriate. R) Denies SI and HI. Wants to be discharged so that he can go to his job on Monday morning.

## 2013-03-17 NOTE — Progress Notes (Addendum)
Patient ID: Franklin [name], male   DOB: 22-Oct-1970, 43 y.o.   MRN: [id number] [facility] MD Progress Note  03/17/2013 2:01 PM Franklin [name]  MRN:  [id number] Subjective:  Patient has multiple psychiatric hospitalization for substance abuse including alcohol and cocaine and bipolar disorder.   Patient continues to not participate in groups, assessment done at bedside.  He verbalizes his constant craving for drugs, "I'm always at risk for relaspe", "I was clean for severn years and relapsed"  He tells me his paln once dc is to go to [place] when he has a better connection to AA/NA, and is considering a program  Called [facility]?/his sister is looking into it.  He is encouraged to talk with SW here at [facility] to assist with options  Rates depression 4/10 Anxiety 4/10  Appetite is good  Diagnosis:   DSM5: Schizophrenia Disorders:   Obsessive-Compulsive Disorders:   Trauma-Stressor Disorders:   Substance/Addictive Disorders:   Depressive Disorders:   Total Time spent with patient: 30 minutes  Axis I: Bipolar, Depressed, Substance Induced Mood Disorder and Cocaine intoxication and alcohol abuse  ADL's:  Intact  Sleep: Fair  Appetite:  Fair  Suicidal Ideation:  Patient endorses suicidal ideation but contracts for safety while in the hospital Homicidal Ideation:  Denied AEB (as evidenced by):  Psychiatric Specialty Exam: Physical Exam  ROS  Blood pressure 133/82, pulse 89, temperature 98.2 F (36.8 C), temperature source Oral, resp. rate 16, height 5\' 10"  (1.778 m), weight 75.297 kg (166 lb), SpO2 98.00%.Body mass index is 23.82 kg/(m^2).  General Appearance: Casual, in bed  Eye Contact::  Good  Speech:  Clear and Coherent  Volume:  Decreased  Mood:  Calm, cooperative  Affect:  Deprressed  Thought Process:  Goal Directed and Intact  Orientation:  Full (Time, Place, and Person)  Thought Content:  Rumination  Suicidal Thoughts:  Yes.  without intent/plan  Homicidal Thoughts:  No  Memory:   Immediate;   Fair  Judgement:  Impaired  Insight:  Lacking  Psychomotor Activity:  Psychomotor Retardation  Concentration:  Fair  Recall:  Fair  Fund of Knowledge:Fair  Language: Good  Akathisia:  NA  Handed:  Right  AIMS (if indicated):     Assets:  Communication Skills Desire for Improvement Physical Health Resilience Social Support  Sleep:  Number of Hours: 6   Musculoskeletal: Strength & Muscle Tone: within normal limits Gait & Station: normal Patient leans: N/A  Current Medications: Current Facility-Administered Medications  Medication Dose Route Frequency Provider Last Rate Last Dose  . acetaminophen (TYLENOL) tablet 650 mg  650 mg Oral Q6H PRN Kerry Hough, PA-C   650 mg at 03/16/13 2238  . alum & mag hydroxide-simeth (MAALOX/MYLANTA) 200-200-20 MG/5ML suspension 30 mL  30 mL Oral Q4H PRN Kerry Hough, PA-C      . buPROPion (WELLBUTRIN XL) 24 hr tablet 150 mg  150 mg Oral Daily Kerry Hough, PA-C   150 mg at 03/17/13 0914  . chlordiazePOXIDE (LIBRIUM) capsule 25 mg  25 mg Oral TID PRN Nehemiah Settle, MD   25 mg at 03/14/13 2257  . feeding supplement (ENSURE COMPLETE) (ENSURE COMPLETE) liquid 237 mL  237 mL Oral BID BM Lavena Bullion, RD   237 mL at 03/16/13 1327  . gabapentin (NEURONTIN) capsule 200 mg  200 mg Oral TID Kerry Hough, PA-C   200 mg at 03/17/13 5621  . hydrOXYzine (ATARAX/VISTARIL) tablet 25 mg  25 mg Oral Q6H PRN Kerry Hough,  PA-C   25 mg at 03/14/13 0603  . magnesium hydroxide (MILK OF MAGNESIA) suspension 30 mL  30 mL Oral Daily PRN Kerry HoughSpencer E Simon, PA-C      . multivitamin with minerals tablet 1 tablet  1 tablet Oral Daily Lavena BullionHeather W Baron, RD   1 tablet at 03/17/13 0914  . nicotine (NICODERM CQ - dosed in mg/24 hours) patch 21 mg  21 mg Transdermal Daily Nehemiah SettleJanardhaha R Jonnalagadda, MD   21 mg at 03/16/13 0857  . traZODone (DESYREL) tablet 100 mg  100 mg Oral QHS PRN Kerry HoughSpencer E Simon, PA-C   100 mg at 03/16/13 2238    Lab  Results:  No results found for this or any previous visit (from the past 48 hour(s)).  Physical Findings: AIMS: Facial and Oral Movements Muscles of Facial Expression: None, normal Lips and Perioral Area: None, normal Jaw: None, normal Tongue: None, normal,Extremity Movements Upper (arms, wrists, hands, fingers): None, normal Lower (legs, knees, ankles, toes): None, normal, Trunk Movements Neck, shoulders, hips: None, normal, Overall Severity Severity of abnormal movements (highest score from questions above): None, normal Incapacitation due to abnormal movements: None, normal Patient's awareness of abnormal movements (rate only patient's report): No Awareness, Dental Status Current problems with teeth and/or dentures?: No Does patient usually wear dentures?: No  CIWA:  CIWA-Ar Total: 1 COWS:  COWS Total Score: 1  Treatment Plan Summary: Daily contact with patient to assess and evaluate symptoms and progress in treatment Medication management  Plan: Treatment Plan/Recommendations:  1. Patient was encouraged to participate in unit program for substance abuse counseling at [address]  2. Medication management to reduce current symptoms to base line and improve the patient's overall level of functioning. Continue Wellbutrin XL 150 mg daily for depression Continue Neurontin 200 mg 3 times daily for mood stabilization Continue nicotine patch 21 mg daily for tobacco smoking 3. Treat health problems as indicated. 4. Develop treatment plan to decrease risk of relapse upon discharge and to reduce the need for readmission. 5. Psycho-social education regarding relapse prevention and self care. **discussed alternative ways of coping and self responsibility in healthcare 6. Health care follow up as needed for medical problems. 7. Restart home medications where appropriate. 8. Disposition plans are in progress and case discussed with the treatment team and encouraged patient to make use of  substance abuse counseling and with medication management before may be appropriate disposition plan. Patient may discharged on Sunday if he continued to show clinical improvement and contracts for safety.   Medical Decision Making Problem Points:  Established problem, worsening (2), New problem, with no additional work-up planned (3), Review of psycho-social stressors (1) and Self-limited or minor (1) Data Points:  Review or order clinical lab tests (1) Review or order medicine tests (1) Review of medication regiment & side effects (2) Review of new medications or change in dosage (2)  I certify that inpatient services furnished can reasonably be expected to improve the patient's condition.   Lorinda CreedLARACH, MARY PMHNP 03/17/2013, 2:01 PM  Patient seen, evaluated and I agree with notes by Nurse Practitioner. Thedore MinsMojeed Akaya Proffit, MD

## 2013-03-17 NOTE — Progress Notes (Signed)
Adult Psychoeducational Group Note  Date:  03/17/2013 Time:  6:57 PM  Group Topic/Focus:  Healthy Communication:   The focus of this group is to discuss communication, barriers to communication, as well as healthy ways to communicate with others.  Participation Level:  Active  Participation Quality:  Appropriate and Attentive  Affect:  Appropriate  Cognitive:  Appropriate  Insight: Appropriate  Engagement in Group:  Engaged  Modes of Intervention:  Activity and Discussion    Franklin MilesMercer, Franklin [name] N 03/17/2013, 6:57 PM

## 2013-03-17 NOTE — Progress Notes (Signed)
[facility] Group Notes:  (Nursing/MHT/Case Management/Adjunct)  Date:  03/17/2013  Time:  12:18 AM  Type of Therapy:  Group Therapy  Participation Level:  Minimal  Participation Quality:  Appropriate  Affect:  Appropriate  Cognitive:  Appropriate  Insight:  Appropriate  Engagement in Group:  Engaged  Modes of Intervention:  Socialization and Support  Summary of Progress/Problems: Pt. Participated in group discussion on relapse prevention.  Pt. Stated he would use his support system as a coping skill to prevent relapse.   [name], Desira Alessandrini J 03/17/2013, 12:18 AM

## 2013-03-17 NOTE — Progress Notes (Signed)
Writer observed patient up in the dayroom interacting appropriately with peers. He attended group this evening and writer spoke with him 1:1. He voiced no complaints and is looking forward to discharge hopefully tomorrow. He currently denies si/hi/a/v hallucinations. Safety maintained with 15 min checks.

## 2013-03-17 NOTE — [facility] Group Notes (Signed)
[facility] Group Notes: (Clinical Social Work)   03/17/2013      Type of Therapy:  Group Therapy   Participation Level:  Did Not Attend    Ambrose MantleMareida Grossman-Orr, LCSW 03/17/2013, 6:00 PM

## 2013-03-18 DIAGNOSIS — F3130 Bipolar disorder, current episode depressed, mild or moderate severity, unspecified: Principal | ICD-10-CM

## 2013-03-18 DIAGNOSIS — F1010 Alcohol abuse, uncomplicated: Secondary | ICD-10-CM

## 2013-03-18 MED ORDER — BUPROPION HCL ER (XL) 150 MG PO TB24: 150.0000 mg | ORAL_TABLET | Freq: Every day | ORAL | Status: DC

## 2013-03-18 MED ORDER — GABAPENTIN 100 MG PO CAPS: 200.0000 mg | ORAL_CAPSULE | Freq: Three times a day (TID) | ORAL | Status: DC

## 2013-03-18 MED ORDER — TRAZODONE HCL 100 MG PO TABS: 100.0000 mg | ORAL_TABLET | Freq: Every evening | ORAL | Status: DC | PRN

## 2013-03-18 NOTE — Discharge Summary (Signed)
Physician Discharge Summary Note  Patient:  Franklin [name] is an 43 y.o., male MRN:  [id number] DOB:  Dec 21, 1970 Patient phone:  [phone] (home)  Patient address:   [address] [address],  Total Time spent with patient: 30 minutes  Date of Admission:  03/14/2013 Date of Discharge: 03-18-13  Reason for Admission:  This was another  hospitalization for substance abuse including alcohol and cocaine and bipolar disorder. He has had multiple.  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 Patient requested detox from alcohol and help with his cocaine abuse, increasing depression and to be restarted on medications which he has been discontinued more than 6 months ago since he was discharged from the lithium at the hospital. Patient reported he did not have any followup with her medical provider. Patient states that he has been sober for about a year until he fell off the wagon 2 weeks prior to admission due to the increased pressures of "life." Patient endorses passive SI with feelings of hopelessness, feelings of worthlessness, self-pity, insomnia, decreased appetite and guilt. Patient has a history of suicide attempt via hanging several years ago and continues to have bouts of depression with suicide ideation. Patient has been seeking for medication management for detox treatment and depression and anxiety. Patient reported he has been having auditory hallucinations (currently mumbling) secondary to cocaine intoxication.   Patient was started on Wellbutrin XL 150 mg po daily                                       Neurontin 200 mg po tid                                       Trazadone 100 mg qhs prn                                       Librium 25 mg po prn will not be continued on discharge  He denies SI/HI, hallucinations.  Reports good  sleep and appetite   Patient has not participated fullt in groups during his hospital stay.  He reports he plans to visit with his son once discharged and get back to work.  Long term hopes to continue his sobriety and treatment plan closer to [name] area where he has more support. He speaks to a long term program named [facility].   We discussed self responsibility in his treatment plan and its success   Discharge Diagnoses: Principal Problem:   Bipolar I disorder, most recent episode (or current) depressed, unspecified Active Problems:   Substance induced mood disorder   Psychiatric Specialty Exam: Physical Exam  Constitutional: He is oriented to person, place, and time. He appears well-developed and well-nourished.  HENT:  Head: Normocephalic.  Neck: Normal range of motion. Neck supple.  Respiratory: Effort normal.  Musculoskeletal: Normal range of motion.  Neurological: He is alert and oriented to person, place, and time.  Skin: Skin is warm and dry.    ROS  Blood pressure 111/81, pulse 74, temperature 97.4 F (36.3 C), temperature source Oral, resp. rate 17, height 5'  10" (1.778 m), weight 75.297 kg (166 lb), SpO2 98.00%.Body mass index is 23.82 kg/(m^2).  General Appearance: Casual  Eye Contact::  Good  Speech:  Clear and Coherent  Volume:  Normal  Mood:  Euthymic  Affect:  Appropriate  Thought Process:  Negative  Orientation:  Full (Time, Place, and Person)  Thought Content:  Negative  Suicidal Thoughts:  No  Homicidal Thoughts:  No  Memory:  Immediate;   Good Recent;   Good Remote;   Good  Judgement:  Fair  Insight:  Fair  Psychomotor Activity:  Normal  Concentration:  Good  Recall:  Good  Fund of Knowledge:Good  Language: Good  Akathisia:  No  Handed:  Right  AIMS (if indicated):     Assets:  Communication Skills Desire for Improvement Resilience  Sleep:  Number of Hours: 6    Past Psychiatric History: Diagnosis:  Hospitalizations:  Outpatient Care:   Substance Abuse Care:  Self-Mutilation:  Suicidal Attempts:  Violent Behaviors:   Musculoskeletal: Strength & Muscle Tone: within normal limits Gait & Station: normal Patient leans: N/A  DSM5:  Substance/Addictive Disorders:  Alcohol Related Disorder - Severe (303.90) Depressive Disorders:  Major Depressive Disorder - Severe (296.23)  Axis Diagnosis:   AXIS I:  Alcohol Abuse, Bipolar, Depressed and Major Depression, Recurrent severe AXIS II:  Deferred AXIS III:   Past Medical History  Diagnosis Date  . Depression   . Anxiety   . Bipolar 1 disorder   . No pertinent past medical history   . Schizophrenia    AXIS IV:  other psychosocial or environmental problems and problems with primary support group AXIS V:  61-70 mild symptoms  Level of Care:  OP  Hospital Course:  Patient was started on Wellbutrin XL 150 mg po daily                                       Neurontin 200 mg po tid                                       Trazadone 100 mg qhs prn                                       Librium 25 mg po prn will not be continued on discharge Patient has not participated in groups during his hospital stay.  He reports he plans to visit with his son once discharged and hopes to continue his sobriety and treatmetn plan closer to [place] area where he has more support.  We discussed self responsibility his his treatment plan and success    Consults:  None  Significant Diagnostic Studies:  None  Discharge Vitals:   Blood pressure 111/81, pulse 74, temperature 97.4 F (36.3 C), temperature source Oral, resp. rate 17, height 5\' 10"  (1.778 m), weight 75.297 kg (166 lb), SpO2 98.00%. Body mass index is 23.82 kg/(m^2). Lab Results:   No results found for this or any previous visit (from the past 72 hour(s)).  Physical Findings: AIMS: Facial and Oral Movements Muscles of Facial Expression: None, normal Lips and Perioral Area: None, normal Jaw: None, normal Tongue: None,  normal,Extremity Movements Upper (arms, wrists, hands, fingers): None, normal Lower (legs, knees, ankles,  toes): None, normal, Trunk Movements Neck, shoulders, hips: None, normal, Overall Severity Severity of abnormal movements (highest score from questions above): None, normal Incapacitation due to abnormal movements: None, normal Patient's awareness of abnormal movements (rate only patient's report): No Awareness, Dental Status Current problems with teeth and/or dentures?: No Does patient usually wear dentures?: No  CIWA:  CIWA-Ar Total: 1 COWS:  COWS Total Score: 1  Psychiatric Specialty Exam: See Psychiatric Specialty Exam and Suicide Risk Assessment completed by Attending Physician prior to discharge.  Discharge destination:  Other:  [facility] he is already connected  Is patient on multiple antipsychotic therapies at discharge:  No   Has Patient had three or more failed trials of antipsychotic monotherapy by history:  No  Recommended Plan for Multiple Antipsychotic Therapies: NA     Medication List       Indication   buPROPion 150 MG 24 hr tablet  Commonly known as:  WELLBUTRIN XL  Take 1 tablet (150 mg total) by mouth daily.   Indication:  Major Depressive Disorder     gabapentin 100 MG capsule  Commonly known as:  NEURONTIN  Take 2 capsules (200 mg total) by mouth 3 (three) times daily.   Indication:  Agitation, Alcohol Withdrawal Syndrome, Pain, Anxiety     traZODone 100 MG tablet  Commonly known as:  DESYREL  Take 1 tablet (100 mg total) by mouth at bedtime as needed for sleep.   Indication:  Trouble Sleeping           Follow-up Information   Follow up with [facility] On 03/20/2013. (Please go to [facility]'s walk in clinic on Tuesday, March 20, 2013 or any weekday between 8AM - 3PM.)    Contact information:   [address] [place], [place]   [address]  [phone]      Follow-up recommendations:  Activity:  as tolreated Diet:  Heart healthy  Comments:   Patient has his own transplantation and is ready to go when discharge complete  Total Discharge Time:  Greater than 30 minutes.  SignedLorinda Creed: LARACH, MARY  PMHNP 03/18/2013, 10:27 AM  Patient seen, evaluated and I agree with notes by Nurse Practitioner. Thedore MinsMojeed Jacek Colson, MD

## 2013-03-18 NOTE — Progress Notes (Signed)
[facility] :  Will you be returning to the same living situation after discharge: Yes,  home At discharge, do you have transportation home?:Yes,  coming to pick up Do you have the ability to pay for your medications:Yes,  no issues indicated  Release of information consent forms completed and in the chart;  Patient's signature needed at discharge.  Patient to Follow up at: Follow-up Information   Follow up with [facility] On 03/20/2013. (Please go to [facility]'s walk in clinic on Tuesday, March 20, 2013 or any weekday between 8AM - 3PM.)    Contact information:   [address] [place], [place]   [address]  [phone]      Patient denies SI/HI:   Yes,  per MD assessment    Safety Planning and Suicide Prevention discussed:  Yes,  per CSW handoff  Sarina SerGrossman-Orr, Emberlyn Burlison Jo 03/18/2013, 5:41 PM

## 2013-03-18 NOTE — [facility] Group Notes (Signed)
Psychoeducational Group Note  Date:  03/18/2013 Time:  0930  Group Topic/Focus:  Making Healthy Choices:   The focus of this group is to help patients identify negative/unhealthy choices they were using prior to admission and identify positive/healthier coping strategies to replace them upon discharge.  Participation Level: Did Not Attend  Participation Quality:  Not Applicable  Affect:  Not Applicable  Cognitive:  Not Applicable  Insight:  Not Applicable  Engagement in Group: Not Applicable  Additional Comments:    Jule SerKent, Eban Weick Gail 03/18/2013, 11:24 AM

## 2013-03-18 NOTE — Progress Notes (Signed)
[facility] Group Notes:  (Nursing/MHT/Case Management/Adjunct)  Date:  03/18/2013  Time:  12:12 AM  Type of Therapy:  Group Therapy  Participation Level:  Active  Participation Quality:  Appropriate  Affect:  Appropriate  Cognitive:  Appropriate  Insight:  Appropriate  Engagement in Group:  Engaged  Modes of Intervention:  Socialization and Support  Summary of Progress/Problems: Pt. Rated his energy level as a 6.  Pt. Stated he was admitted because of a relapse in drugs and alcohol.  Pt. Stated he plans to go to AA meetings after he is discharged.  [name], [name] 03/18/2013, 12:12 AM

## 2013-03-18 NOTE — Discharge Instructions (Signed)
Drug Abuse and Addiction in Sports  There are many types of drugs that one may become addicted to including illegal drugs (marijuana, cocaine, amphetamines, hallucinogens, and narcotics), prescription drugs (hydrocodone, codeine, and alprazolam), and other chemicals such as alcohol or nicotine.  Two types of addiction exist: physical and emotional. Physical addiction usually occurs after prolonged use of a drug. However, some drugs may only take a couple uses before addiction can occur. Physical addiction is marked by withdrawal symptoms, in which the person experiences negative symptoms such as sweat, anxiety, tremors, hallucinations, or cravings in the absence of using the drug. Emotional dependence is the psychological desire for the "high" that the drugs produce when taken.  SYMPTOMS   · Inattentiveness.  · Negligence.  · Forgetfulness.  · Insomnia.  · Mood swings.  RISK INCREASES WITH:   · Family history of addiction.  · Personal history of addictive personality.  Studies have shown that risktakers, which many athletes are, have a higher risk of addiction.  PREVENTION  The only adequate prevention of drug abuse is abstinence from drugs.  TREATMENT   The first step in quitting substance abuse is recognizing the problem and realizing that one has the power to change. Quitting requires a plan and support from others. It is often necessary to seek medical assistance. Caregivers are available to offer counseling, and for certain cases, medicine to diminish the physical symptoms of withdrawal. Many organizations exist such as Alcoholics Anonymous, Narcotics Anonymous, or the National Council on Alcoholism that offer support for individuals who have chosen to quit their habits.  Document Released: 12/21/2004 Document Revised: 03/15/2011 Document Reviewed: 04/04/2008  ExitCare® Patient Information ©2014 [employer].

## 2013-03-18 NOTE — Progress Notes (Signed)
Psychoeducational Group Note  Date:  03/18/2013 Time:  1015  Group Topic/Focus:  Making Healthy Choices:   The focus of this group is to help patients identify negative/unhealthy choices they were using prior to admission and identify positive/healthier coping strategies to replace them upon discharge.  Participation Level:  Did not attend Additional Comments:    Dione HousekeeperJudge, Kenderick Kobler A 03/18/2013

## 2013-03-18 NOTE — Progress Notes (Signed)
D) Pt being discharged to home. Mood and affect are appropriate. Pt denies SI and HI. Feels that he is doing well and states "I will come back here if I need to. I really needed to come this time". Pt rates his depression and hopelessness both at a 1. A) Pt given support, reassurance and praise. Encouraged to follow his plan of discharge and to take the medication as planned. All belongings returned to Pt. Medications explained along with medications. R) Pt denies SI and HI.

## 2013-03-18 NOTE — [facility] Suicide Risk Assessment (Signed)
   Demographic Factors:  Male and employed  Total Time spent with patient: 20 minutes  Psychiatric Specialty Exam: Physical Exam  Psychiatric: He has a normal mood and affect. His speech is normal and behavior is normal. Judgment and thought content normal. Cognition and memory are normal.    Review of Systems  Constitutional: Negative.   HENT: Negative.   Eyes: Negative.   Respiratory: Negative.   Cardiovascular: Negative.   Gastrointestinal: Negative.   Genitourinary: Negative.   Musculoskeletal: Negative.   Skin: Negative.   Neurological: Negative.   Endo/Heme/Allergies: Negative.   Psychiatric/Behavioral: Negative.     Blood pressure 111/81, pulse 74, temperature 97.4 F (36.3 C), temperature source Oral, resp. rate 17, height 5\' 10"  (1.778 m), weight 75.297 kg (166 lb), SpO2 98.00%.Body mass index is 23.82 kg/(m^2).  General Appearance: Fairly Groomed  Patent attorneyye Contact::  Good  Speech:  Clear and Coherent and Normal Rate  Volume:  Normal  Mood:  Euthymic  Affect:  Appropriate  Thought Process:  Goal Directed  Orientation:  Full (Time, Place, and Person)  Thought Content:  Negative  Suicidal Thoughts:  No  Homicidal Thoughts:  No  Memory:  Immediate;   Fair Recent;   Fair Remote;   Fair  Judgement:  Fair  Insight:  Fair  Psychomotor Activity:  Normal  Concentration:  Fair  Recall:  FiservFair  Fund of Knowledge:Fair  Language: Good  Akathisia:  No  Handed:  Right  AIMS (if indicated):     Assets:  Communication Skills Desire for Improvement Physical Health  Sleep:  Number of Hours: 6    Musculoskeletal: Strength & Muscle Tone: within normal limits Gait & Station: normal Patient leans: N/A   Mental Status Per Nursing Assessment::   On Admission:  Suicidal ideation indicated by patient  Current Mental Status by Physician: patient denies suicidal ideation,intent or plan  Loss Factors: NA  Historical Factors: Impulsivity  Risk Reduction Factors:   Sense  of responsibility to family, Living with another person, especially a relative and Positive social support  Continued Clinical Symptoms:  Alcohol/Substance Abuse/Dependencies  Cognitive Features That Contribute To Risk:  Closed-mindedness    Suicide Risk:  Minimal: No identifiable suicidal ideation.  Patients presenting with no risk factors but with morbid ruminations; may be classified as minimal risk based on the severity of the depressive symptoms  Discharge Diagnoses:   AXIS I:  Bipolar I disorder, most recent episode (or current) depressed, unspecified              Polysubstance abuse AXIS II:  Deferred AXIS III:   Past Medical History  Diagnosis Date  . No pertinent past medical history    AXIS IV:  other psychosocial or environmental problems and problems related to social environment AXIS V:  61-70 mild symptoms  Plan Of Care/Follow-up recommendations:  Activity:  as tolerated Diet:  healthy Tests:  routine Other:  patient to keep his after care appointment  Is patient on multiple antipsychotic therapies at discharge:  No   Has Patient had three or more failed trials of antipsychotic monotherapy by history:  No  Recommended Plan for Multiple Antipsychotic Therapies: NA    Thedore MinsAkintayo, Lelon Ikard, MD 03/18/2013, 9:18 AM

## 2013-03-23 NOTE — Progress Notes (Signed)
Patient Discharge Instructions:  After Visit Summary (AVS):   Faxed to:  03/23/13 Discharge Summary Note:   Faxed to:  03/23/13 Psychiatric Admission Assessment Note:   Faxed to:  03/23/13 Suicide Risk Assessment - Discharge Assessment:   Faxed to:  03/23/13 Faxed/Sent to the Next Level Care provider:  03/23/13 [facility] @ [phone]  Jerelene ReddenSheena E [place], 03/23/2013, 1:31 PM

## 2013-05-21 ENCOUNTER — Encounter (HOSPITAL_COMMUNITY): Payer: Self-pay | Admitting: Emergency Medicine

## 2013-05-21 ENCOUNTER — Emergency Department (HOSPITAL_COMMUNITY)
Admission: EM | Admit: 2013-05-21 | Discharge: 2013-05-22 | Disposition: A | Discharge status: Home / Self Care | Payer: Self-pay | Attending: Emergency Medicine | Admitting: Emergency Medicine

## 2013-05-21 DIAGNOSIS — F329 Major depressive disorder, single episode, unspecified: Secondary | ICD-10-CM

## 2013-05-21 DIAGNOSIS — F1410 Cocaine abuse, uncomplicated: Secondary | ICD-10-CM | POA: Insufficient documentation

## 2013-05-21 DIAGNOSIS — F10988 Alcohol use, unspecified with other alcohol-induced disorder: Secondary | ICD-10-CM | POA: Insufficient documentation

## 2013-05-21 DIAGNOSIS — Z8659 Personal history of other mental and behavioral disorders: Secondary | ICD-10-CM | POA: Insufficient documentation

## 2013-05-21 DIAGNOSIS — F17200 Nicotine dependence, unspecified, uncomplicated: Secondary | ICD-10-CM | POA: Insufficient documentation

## 2013-05-21 DIAGNOSIS — F1020 Alcohol dependence, uncomplicated: Secondary | ICD-10-CM | POA: Insufficient documentation

## 2013-05-21 DIAGNOSIS — F1029 Alcohol dependence with unspecified alcohol-induced disorder: Secondary | ICD-10-CM | POA: Diagnosis present

## 2013-05-21 DIAGNOSIS — F339 Major depressive disorder, recurrent, unspecified: Secondary | ICD-10-CM | POA: Insufficient documentation

## 2013-05-21 DIAGNOSIS — F1010 Alcohol abuse, uncomplicated: Secondary | ICD-10-CM

## 2013-05-21 DIAGNOSIS — F3130 Bipolar disorder, current episode depressed, mild or moderate severity, unspecified: Secondary | ICD-10-CM | POA: Diagnosis present

## 2013-05-21 DIAGNOSIS — F1994 Other psychoactive substance use, unspecified with psychoactive substance-induced mood disorder: Secondary | ICD-10-CM | POA: Diagnosis present

## 2013-05-21 DIAGNOSIS — F1910 Other psychoactive substance abuse, uncomplicated: Secondary | ICD-10-CM | POA: Diagnosis present

## 2013-05-21 LAB — COMPREHENSIVE METABOLIC PANEL
ALT: 15 U/L (ref 0–53)
AST: 20 U/L (ref 0–37)
Albumin: 4.1 g/dL (ref 3.5–5.2)
Alkaline Phosphatase: 67 U/L (ref 39–117)
BUN: 8 mg/dL (ref 6–23)
CO2: 25 mEq/L (ref 19–32)
Calcium: 9.6 mg/dL (ref 8.4–10.5)
Chloride: 99 mEq/L (ref 96–112)
Creatinine, Ser: 0.86 mg/dL (ref 0.50–1.35)
GFR calc Af Amer: >90 mL/min (ref >90)
GFR calc non Af Amer: >90 mL/min (ref >90)
Glucose, Bld: 112 mg/dL — ABNORMAL HIGH (ref 70–99)
Potassium: 3.7 mEq/L (ref 3.7–5.3)
Sodium: 140 mEq/L (ref 137–147)
Total Bilirubin: 1 mg/dL (ref 0.3–1.2)
Total Protein: 7.4 g/dL (ref 6.0–8.3)

## 2013-05-21 LAB — RAPID URINE DRUG SCREEN, HOSP PERFORMED
Amphetamines: NOT DETECTED
Barbiturates: NOT DETECTED
Benzodiazepines: NOT DETECTED
Cocaine: POSITIVE — AB
Opiates: NOT DETECTED
Tetrahydrocannabinol: NOT DETECTED

## 2013-05-21 LAB — CBC
HCT: 43 % (ref 39.0–52.0)
Hemoglobin: 15.1 g/dL (ref 13.0–17.0)
MCH: 30.1 pg (ref 26.0–34.0)
MCHC: 35.1 g/dL (ref 30.0–36.0)
MCV: 85.8 fL (ref 78.0–100.0)
Platelets: 278 10*3/uL (ref 150–400)
RBC: 5.01 MIL/uL (ref 4.22–5.81)
RDW: 14.3 % (ref 11.5–15.5)
WBC: 7.4 10*3/uL (ref 4.0–10.5)

## 2013-05-21 LAB — ETHANOL: Alcohol, Ethyl (B): 18 mg/dL — ABNORMAL HIGH (ref 0–11)

## 2013-05-21 MED ORDER — BUPROPION HCL 75 MG PO TABS: 150.0000 mg | ORAL_TABLET | Freq: Every day | ORAL | Status: DC

## 2013-05-21 MED ORDER — LORAZEPAM 1 MG PO TABS
0.0000 mg | ORAL_TABLET | Freq: Four times a day (QID) | ORAL | Status: DC
  Administered 2013-05-21: 4 mg via ORAL
  Filled 2013-05-21: qty 4

## 2013-05-21 MED ORDER — BUPROPION HCL ER (XL) 150 MG PO TB24
150.0000 mg | ORAL_TABLET | Freq: Every day | ORAL | Status: DC
Start: 2013-05-21 — End: 2013-05-22
  Administered 2013-05-22: 150 mg via ORAL
  Filled 2013-05-21 (×2): qty 1

## 2013-05-21 MED ORDER — GABAPENTIN 100 MG PO CAPS
200.0000 mg | ORAL_CAPSULE | Freq: Three times a day (TID) | ORAL | Status: DC
  Administered 2013-05-22: 200 mg via ORAL
  Filled 2013-05-21 (×5): qty 2

## 2013-05-21 MED ORDER — LORAZEPAM 1 MG PO TABS: 0.0000 mg | ORAL_TABLET | Freq: Two times a day (BID) | ORAL | Status: DC

## 2013-05-21 MED ORDER — TRAZODONE HCL 100 MG PO TABS
100.0000 mg | ORAL_TABLET | Freq: Every day | ORAL | Status: DC
  Filled 2013-05-21: qty 1

## 2013-05-21 NOTE — ED Notes (Signed)
In to assess patient .  On stretcher with eyes closed and resp even and unlabored.

## 2013-05-21 NOTE — ED Notes (Signed)
Pt given sandwich and drink.

## 2013-05-21 NOTE — ED Notes (Signed)
Pt refused his meds.

## 2013-05-21 NOTE — BH Assessment (Signed)
Assessment Note  Franklin [name] is an 43 y.o. male presenting to [facility] ED requesting detox from alcohol and crack. Pt stated "I have poor coping skills". Pt reported that this is the first time that he has been"drinking and drugging in 3-4 months". Pt is sleeping on and off however he is responsive to his name. Pt denies SI, HI, AH and VH at the present time.  Pt reported that he had a suicide attempt about 5 years ago when he attempted to hang himself. Pt also reported that he has completed detox in the past however he has "poor coping skills".  Pt reported Pt is endorsing depressive symptoms such as isolation, fatigue, loss of interest in usual pleasures, feeling worthless and feeling irritable. Pt did not report any issues with his appetite; however he did report issues with his sleep. Pt reported that he gets approximately 4-5 hours of sleep at night. Pt reported that he has been using crack/cocaine since the age of 53 and uses and "8 ball". Pt also reported that he has been drinking alcohol since the age 70 and now he currently drinks a 12 pack to a case a day. Pt denies any withdrawal symptoms at the present time other than a headache. Pt denied any physical, sexual or emotional abuse at the present time. Pt reported that he lives with his mother and counts on his mother and sisters for support.   Axis I: Bipolar, Depressed and Polysubstance abuse Axis II: Deferred Axis III:  Past Medical History  Diagnosis Date  . Depression   . Anxiety   . Bipolar 1 disorder   . No pertinent past medical history   . Schizophrenia    Axis IV: other psychosocial or environmental problems Axis V: 41-50 serious symptoms  Past Medical History:  Past Medical History  Diagnosis Date  . Depression   . Anxiety   . Bipolar 1 disorder   . No pertinent past medical history   . Schizophrenia     Past Surgical History  Procedure Laterality Date  . No past surgeries      Family History:  Family History   Problem Relation Age of Onset  . Hypertension Other   . Diabetes Other     Social History:  reports that he has been smoking Cigarettes.  He has been smoking about 1.00 pack per day. He has never used smokeless tobacco. He reports that he drinks about 7.2 ounces of alcohol per week. He reports that he uses illicit drugs ("Crack" cocaine).  Additional Social History:  Alcohol / Drug Use Pain Medications: denies abuse  Prescriptions: denies abuse  Over the Counter: denies abuse  History of alcohol / drug use?: Yes Negative Consequences of Use: Financial Substance #1 Name of Substance 1: Crack/cocaine  1 - Age of First Use: 19  1 - Amount (size/oz): 8 ball 1 - Frequency: daily  1 - Duration: ongoing  1 - Last Use / Amount: 05-20-13 8 ball Substance #2 Name of Substance 2: Alcohol  2 - Age of First Use: 15 2 - Amount (size/oz): 12 pk to a case 2 - Frequency: 5 days a week 2 - Duration: ongoing  2 - Last Use / Amount: 05-20-13 12 pk   CIWA: CIWA-Ar BP: 120/71 mmHg Pulse Rate: 95 COWS:    Allergies: No Known Allergies  Home Medications:  (Not in a hospital admission)  OB/GYN Status:  No LMP for male patient.  General Assessment Data Location of Assessment: [facility] ED Is  this a Tele or Face-to-Face Assessment?: Face-to-Face Is this an Initial Assessment or a Re-assessment for this encounter?: Initial Assessment Living Arrangements: Parent Can pt return to current living arrangement?: Yes Admission Status: Voluntary Is patient capable of signing voluntary admission?: Yes Transfer from: Acute Hospital Referral Source: [facility]Family/Friend     [facility] Living Arrangements: Parent  Education Status Is patient currently in school?: No  Risk to self Suicidal Ideation: No Suicidal Intent: No Is patient at risk for suicide?: No Suicidal Plan?: No Access to Means: No What has been your use of drugs/alcohol within the last 12 months?: daily  Previous  Attempts/Gestures: Yes How many times?: 1 Other Self Harm Risks: none identified at this time Triggers for Past Attempts: Unknown Intentional Self Injurious Behavior: None Family Suicide History: No Recent stressful life event(s): Other (Comment) (Work related stress) Persecutory voices/beliefs?: No Depression: Yes Depression Symptoms: Isolating;Fatigue;Loss of interest in usual pleasures;Feeling worthless/self pity;Feeling angry/irritable Substance abuse history and/or treatment for substance abuse?: Yes Suicide prevention information given to non-admitted patients: Not applicable  Risk to Others Homicidal Ideation: No Thoughts of Harm to Others: No Current Homicidal Intent: No Current Homicidal Plan: No Access to Homicidal Means: No Identified Victim: N/A History of harm to others?: No Assessment of Violence: None Noted Violent Behavior Description: No violent behavior reported Does patient have access to weapons?: No Criminal Charges Pending?: No Does patient have a court date: No  Psychosis Hallucinations: None noted Delusions: None noted  Mental Status Report Appear/Hygiene: Other (Comment) (Appropriate) Eye Contact: Fair Motor Activity: Freedom of movement Speech: Logical/coherent Level of Consciousness: Quiet/awake Mood: Depressed Affect: Appropriate to circumstance Anxiety Level: None Thought Processes: Coherent;Relevant Judgement: Unimpaired Orientation: Appropriate for developmental age Obsessive Compulsive Thoughts/Behaviors: None  Cognitive Functioning Concentration: Normal Memory: Recent Intact;Remote Intact IQ: Average Insight: Fair Impulse Control: Fair Appetite: Good Weight Loss: 0 Weight Gain: 0 Sleep: No Change Total Hours of Sleep: 5 Vegetative Symptoms: None  ADLScreening [facility]) Patient's cognitive ability adequate to safely complete daily activities?: Yes Patient able to express need for assistance with ADLs?:  Yes Independently performs ADLs?: Yes (appropriate for developmental age)  Prior Inpatient Therapy Prior Inpatient Therapy: Yes Prior Therapy Dates: 2015 [facility] Reason for Treatment: Detox, SI  Prior Outpatient Therapy Prior Outpatient Therapy: No  ADL Screening (condition at time of admission) Patient's cognitive ability adequate to safely complete daily activities?: Yes Is the patient deaf or have difficulty hearing?: No Does the patient have difficulty seeing, even when wearing glasses/contacts?: No Does the patient have difficulty concentrating, remembering, or making decisions?: No Patient able to express need for assistance with ADLs?: Yes Does the patient have difficulty dressing or bathing?: No Independently performs ADLs?: Yes (appropriate for developmental age) Does the patient have difficulty walking or climbing stairs?: No       Abuse/Neglect Assessment (Assessment to be complete while patient is alone) Physical Abuse: Denies Verbal Abuse: Denies Sexual Abuse: Denies Exploitation of patient/patient's resources: Denies Self-Neglect: Denies Values / Beliefs Cultural Requests During Hospitalization: None Spiritual Requests During Hospitalization: None        Additional Information 1:1 In Past 12 Months?: No CIRT Risk: No Elopement Risk: No Does patient have medical clearance?: Yes     Disposition: Consulted with Franklin SamFran Hobson, NP who agrees that patient meets inpatient criteria. Dr. Wilkie AyeHorton has been notified of recommendations.  Disposition Initial Assessment Completed for this Encounter: Yes Disposition of Patient: Inpatient treatment program Type of inpatient treatment program: Adult  On Site Evaluation by:   Reviewed with Physician:    Lahoma RockerLaquesta S Sacha [name] 05/21/2013 6:40 AM

## 2013-05-21 NOTE — BH Assessment (Signed)
Assessment completed. Consulted with Alberteen SamFran Hobson, NP who agrees that patient meets inpatient criteria. Dr. Wilkie AyeHorton was notified about recommendation. [facility] at capacity. TTS will seek treatment at other facilities.

## 2013-05-21 NOTE — ED Provider Notes (Signed)
CSN: [id number]     Arrival date & time 05/21/13  0424 History   First MD Initiated Contact with Patient 05/21/13 [phone]     Chief Complaint  Patient presents with  . Medical Clearance     (Consider location/radiation/quality/duration/timing/severity/associated sxs/prior Treatment) HPI  This is a 43 year old male with a history of depression, bipolar disorder, schizophrenia, alcohol abuse, and crack cocaine use who presents requesting detox. He states "I've got an alcohol problem."  He reports daily drinking; 1 case of beer daily. Last alcohol was approximately 2 hours prior to arrival. He reports crack cocaine use 3-4 hours prior to arrival. He denies any heroine, marijuana, or other illicit drug use. He reports history of alcohol withdrawal symptoms including DTs. He denies any physical complaints at this time.  Past Medical History  Diagnosis Date  . Depression   . Anxiety   . Bipolar 1 disorder   . No pertinent past medical history   . Schizophrenia    Past Surgical History  Procedure Laterality Date  . No past surgeries     Family History  Problem Relation Age of Onset  . Hypertension Other   . Diabetes Other    History  Substance Use Topics  . Smoking status: Current Every Day Smoker -- 1.00 packs/day    Types: Cigarettes  . Smokeless tobacco: Never Used  . Alcohol Use: 7.2 oz/week    12 Cans of beer per week     Comment: heavy 12 pk/day    Review of Systems  Constitutional: Negative.  Negative for fever.  Respiratory: Negative.  Negative for chest tightness and shortness of breath.   Cardiovascular: Negative.  Negative for chest pain.  Gastrointestinal: Negative.  Negative for abdominal pain.  All other systems reviewed and are negative.     Allergies  Review of patient's allergies indicates no known allergies.  Home Medications   Prior to Admission medications   Not on File   BP 120/71  Pulse 95  Temp(Src) 98.5 F (36.9 C) (Oral)  Resp 22  Ht 5'  10" (1.778 m)  Wt 170 lb (77.111 kg)  BMI 24.39 kg/m2  SpO2 96% Physical Exam  Nursing note and vitals reviewed. Constitutional: He is oriented to person, place, and time. No distress.  HENT:  Head: Normocephalic and atraumatic.  Eyes: Pupils are equal, round, and reactive to light.  Cardiovascular: Normal rate and regular rhythm.   Pulmonary/Chest: Effort normal. No respiratory distress.  Neurological: He is alert and oriented to person, place, and time.  Skin: Skin is warm and dry.  Psychiatric: He has a normal mood and affect.    ED Course  Procedures (including critical care time) Labs Review Labs Reviewed  COMPREHENSIVE METABOLIC PANEL - Abnormal; Notable for the following:    Glucose, Bld 112 (*)    All other components within normal limits  ETHANOL - Abnormal; Notable for the following:    Alcohol, Ethyl (B) 18 (*)    All other components within normal limits  URINE RAPID DRUG SCREEN (HOSP PERFORMED) - Abnormal; Notable for the following:    Cocaine POSITIVE (*)    All other components within normal limits  CBC    Imaging Review No results found.   EKG Interpretation None      MDM   Final diagnoses:  None    Patient presents requesting detox from alcohol and crack cocaine. He is nontoxic on exam. He has no physical complaints. Psych labs ordered. Patient placed on CIWA protocol.  TTS to evaluate.    Shon Batonourtney F Horton, MD 05/21/13 [phone]

## 2013-05-21 NOTE — Consult Note (Signed)
[facility] Face-to-Face Psychiatry Consult   Reason for Consult:  Alcohol/cocaine relapse Referring Physician:  EDP  Franklin [name] is an 43 y.o. male. Total Time spent with patient: 20 minutes  Assessment: AXIS I:  Alcohol Abuse, Substance Abuse and Substance Induced Mood Disorder AXIS II:  Deferred AXIS III:   Past Medical History  Diagnosis Date  . Depression   . Anxiety   . Bipolar 1 disorder   . No pertinent past medical history   . Schizophrenia    AXIS IV:  other psychosocial or environmental problems, problems related to social environment and problems with primary support group AXIS V:  51-60 moderate symptoms  Plan:  Supportive therapy provided about ongoing stressors.  Observation until tomorrow and discharge per Dr. De Nurse.  Subjective:   Franklin [name] is a 44 y.o. male patient does not warrant admission.  HPI:  Patient came to the ED after lapsing on alcohol and cocaine Saturday.  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  Denies suicidal/homicidal ideations and hallucinations.  Rx given to get him to his next appointment and observe over night. HPI Elements:   Location:  generalized. Quality:  acute. Severity:  moderate. Timing:  constant. Duration:  two days. Context:  stressors.  Past Psychiatric History: Past Medical History  Diagnosis Date  . Depression   . Anxiety   . Bipolar 1 disorder   . No pertinent past medical history   . Schizophrenia     reports that he has been smoking Cigarettes.  He has been smoking about 1.00 pack per day. He has never used smokeless tobacco. He reports that he drinks about 7.2 ounces of alcohol per week. He reports that he uses illicit drugs ("Crack" cocaine). Family History  Problem Relation Age of Onset  . Hypertension Other   . Diabetes Other    Family History Substance Abuse:  (Sisters) Family Supports:  Yes, List: (Mother and sisters) Living Arrangements: Parent Can pt return to current living arrangement?: Yes Abuse/Neglect [facility]) Physical Abuse: Denies Verbal Abuse: Denies Sexual Abuse: Denies Allergies:  No Known Allergies  ACT Assessment Complete:  Yes:    Educational Status    Risk to Self: Risk to self Suicidal Ideation: No Suicidal Intent: No Is patient at risk for suicide?: No Suicidal Plan?: No Access to Means: No What has been your use of drugs/alcohol within the last 12 months?: daily  Previous Attempts/Gestures: Yes How many times?: 1 Other Self Harm Risks: none identified at this time Triggers for Past Attempts: Unknown Intentional Self Injurious Behavior: None Family Suicide History: No Recent stressful life event(s): Other (Comment) (Work related stress) Persecutory voices/beliefs?: No Depression: Yes Depression Symptoms: Isolating;Fatigue;Loss of interest in usual pleasures;Feeling worthless/self pity;Feeling angry/irritable Substance abuse history and/or treatment for substance abuse?: Yes Suicide prevention information given to non-admitted patients: Not applicable  Risk to Others: Risk to Others Homicidal Ideation: No Thoughts of Harm to Others: No Current Homicidal Intent: No Current Homicidal Plan: No Access to Homicidal Means: No Identified Victim: N/A History of harm to others?: No Assessment of Violence: None Noted Violent Behavior Description: No violent behavior reported Does patient have access to weapons?: No Criminal Charges Pending?: No Does patient have a court date: No  Abuse: Abuse/Neglect Assessment (Assessment to be complete while patient is alone) Physical Abuse: Denies Verbal Abuse: Denies Sexual Abuse: Denies Exploitation of patient/patient's resources: Denies Self-Neglect: Denies  Prior Inpatient Therapy:  Prior Inpatient Therapy Prior Inpatient Therapy: Yes Prior Therapy Dates: 2015 Prior Therapy Facilty/Provider(s):  [facility] Reason for Treatment: Detox, SI  Prior Outpatient Therapy: Prior Outpatient Therapy Prior Outpatient Therapy: No  Additional Information: Additional Information 1:1 In Past 12 Months?: No CIRT Risk: No Elopement Risk: No Does patient have medical clearance?: Yes                  Objective: Blood pressure 126/78, pulse 82, temperature 98.5 F (36.9 C), temperature source Oral, resp. rate 20, height $RemoveBe'5\' 10"'EYSfjtvBF$  (1.778 m), weight 170 lb (77.111 kg), SpO2 98.00%.Body mass index is 24.39 kg/(m^2). Results for orders placed during the hospital encounter of 05/21/13 (from the past 72 hour(s))  URINE RAPID DRUG SCREEN (HOSP PERFORMED)     Status: Abnormal   Collection Time    05/21/13  4:47 AM      Result Value Ref Range   Opiates NONE DETECTED  NONE DETECTED   Cocaine POSITIVE (*) NONE DETECTED   Benzodiazepines NONE DETECTED  NONE DETECTED   Amphetamines NONE DETECTED  NONE DETECTED   Tetrahydrocannabinol NONE DETECTED  NONE DETECTED   Barbiturates NONE DETECTED  NONE DETECTED   Comment:            DRUG SCREEN FOR MEDICAL PURPOSES     ONLY.  IF CONFIRMATION IS NEEDED     FOR ANY PURPOSE, NOTIFY LAB     WITHIN 5 DAYS.                LOWEST DETECTABLE LIMITS     FOR URINE DRUG SCREEN     Drug Class       Cutoff (ng/mL)     Amphetamine      1000     Barbiturate      200     Benzodiazepine   017     Tricyclics       793     Opiates          300     Cocaine          300     THC              50  CBC     Status: None   Collection Time    05/21/13  5:00 AM      Result Value Ref Range   WBC 7.4  4.0 - 10.5 K/uL   RBC 5.01  4.22 - 5.81 MIL/uL   Hemoglobin 15.1  13.0 - 17.0 g/dL   HCT 43.0  39.0 - 52.0 %   MCV 85.8  78.0 - 100.0 fL   MCH 30.1  26.0 - 34.0 pg   MCHC 35.1  30.0 - 36.0 g/dL   RDW 14.3  11.5 - 15.5 %   Platelets 278  150 - 400 K/uL  COMPREHENSIVE METABOLIC PANEL     Status: Abnormal   Collection Time    05/21/13  5:00 AM      Result Value Ref Range    Sodium 140  137 - 147 mEq/L   Potassium 3.7  3.7 - 5.3 mEq/L   Chloride 99  96 - 112 mEq/L   CO2 25  19 - 32 mEq/L   Glucose, Bld 112 (*) 70 - 99 mg/dL   BUN 8  6 - 23 mg/dL   Creatinine, Ser 0.86  0.50 - 1.35 mg/dL   Calcium 9.6  8.4 - 10.5 mg/dL   Total Protein 7.4  6.0 - 8.3  g/dL   Albumin 4.1  3.5 - 5.2 g/dL   AST 20  0 - 37 U/L   ALT 15  0 - 53 U/L   Alkaline Phosphatase 67  39 - 117 U/L   Total Bilirubin 1.0  0.3 - 1.2 mg/dL   GFR calc non Af Amer >90  >90 mL/min   GFR calc Af Amer >90  >90 mL/min   Comment: (NOTE)     The eGFR has been calculated using the CKD EPI equation.     This calculation has not been validated in all clinical situations.     eGFR's persistently <90 mL/min signify possible Chronic Kidney     Disease.  ETHANOL     Status: Abnormal   Collection Time    05/21/13  5:00 AM      Result Value Ref Range   Alcohol, Ethyl (B) 18 (*) 0 - 11 mg/dL   Comment:            LOWEST DETECTABLE LIMIT FOR     SERUM ALCOHOL IS 11 mg/dL     FOR MEDICAL PURPOSES ONLY   Labs are reviewed and are pertinent for no medical issues noted.  Current Facility-Administered Medications  Medication Dose Route Frequency Provider Last Rate Last Dose  . buPROPion (WELLBUTRIN XL) 24 hr tablet 150 mg  150 mg Oral Daily Waylan Boga, NP      . gabapentin (NEURONTIN) capsule 200 mg  200 mg Oral TID Waylan Boga, NP      . LORazepam (ATIVAN) tablet 0-4 mg  0-4 mg Oral 4 times per day Merryl Hacker, MD   4 mg at 05/21/13 1200   Followed by  . [START ON 05/23/2013] LORazepam (ATIVAN) tablet 0-4 mg  0-4 mg Oral Q12H Merryl Hacker, MD      . traZODone (DESYREL) tablet 100 mg  100 mg Oral QHS Waylan Boga, NP       No current outpatient prescriptions on file.    Psychiatric Specialty Exam:     Blood pressure 126/78, pulse 82, temperature 98.5 F (36.9 C), temperature source Oral, resp. rate 20, height $RemoveBe'5\' 10"'sgZceuNez$  (1.778 m), weight 170 lb (77.111 kg), SpO2 98.00%.Body mass index is  24.39 kg/(m^2).  General Appearance: Casual  Eye Contact::  Good  Speech:  Normal Rate  Volume:  Normal  Mood:  Euthymic  Affect:  Congruent  Thought Process:  Coherent  Orientation:  Full (Time, Place, and Person)  Thought Content:  WDL  Suicidal Thoughts:  No  Homicidal Thoughts:  No  Memory:  Immediate;   Good Recent;   Good Remote;   Good  Judgement:  Fair  Insight:  Fair  Psychomotor Activity:  Normal  Concentration:  Good  Recall:  Good  Fund of Knowledge:Good  Language: Good  Akathisia:  No  Handed:  Right  AIMS (if indicated):     Assets:  Financial Resources/Insurance Physical Health Resilience Social Support Vocational/Educational  Sleep:      Musculoskeletal: Strength & Muscle Tone: within normal limits Gait & Station: normal Patient leans: N/A  Treatment Plan Summary: Observe over night and manage withdrawal, discharge with Rx to get to his next appointment.  Waylan Boga, PMH-NP 05/21/2013 3:36 PM I have personally seen the patient and agreed with the findings and involved in the treatment plan. Monitor vitals and withdrawals. Merian Capron, MD

## 2013-05-21 NOTE — ED Notes (Signed)
Pt states he is here for detox off of alcohol and drugs  Pt last used alcohol about 1 hr ago and last used crack about 3 to 4 hrs ago  Pt states he normally drinks about a 12 pk to a case a day  Pt states he used crack this weekend for the first time in about 2 mths

## 2013-05-21 NOTE — ED Notes (Signed)
Charge spoke to [facility], waiting for placement 

## 2013-05-21 NOTE — Progress Notes (Signed)
[facility] provided pt with a list of primary care resources and a [facility] [employer] Card application, highlighting [facility], to help patient establish primary care.  °

## 2013-05-21 NOTE — ED Notes (Signed)
Belongings in Locker 30 

## 2013-05-21 NOTE — ED Notes (Signed)
Patient has a pair of jeans,collared shirt, pair of flip flops, and underclothing in locker 32

## 2013-05-21 NOTE — ED Notes (Signed)
Bed: [id number] Expected date:  Expected time:  Means of arrival:  Comments: [place]

## 2013-05-21 NOTE — BH Assessment (Signed)
Spoke with Dr. Wilkie AyeHorton who stated that patient is requesting detox. Patient has a history of alcohol abuse and drinks a case of beer daily. Pt used crack/cocaine several days ago. UDS positive for cocaine. No SI/HI. Assessment will be initiated.

## 2013-05-22 ENCOUNTER — Encounter (HOSPITAL_COMMUNITY): Payer: Self-pay | Admitting: Psychiatry

## 2013-05-22 DIAGNOSIS — F411 Generalized anxiety disorder: Secondary | ICD-10-CM

## 2013-05-22 DIAGNOSIS — F1029 Alcohol dependence with unspecified alcohol-induced disorder: Secondary | ICD-10-CM | POA: Diagnosis present

## 2013-05-22 MED ORDER — HYDROXYZINE HCL 25 MG PO TABS: 25.0000 mg | ORAL_TABLET | Freq: Three times a day (TID) | ORAL | Status: DC | PRN

## 2013-05-22 MED ORDER — BUPROPION HCL ER (XL) 150 MG PO TB24: 150.0000 mg | ORAL_TABLET | Freq: Every day | ORAL | Status: DC

## 2013-05-22 MED ORDER — HYDROXYZINE PAMOATE 25 MG PO CAPS: 25.0000 mg | ORAL_CAPSULE | Freq: Three times a day (TID) | ORAL | Status: DC | PRN

## 2013-05-22 MED ORDER — GABAPENTIN 100 MG PO CAPS: 200.0000 mg | ORAL_CAPSULE | Freq: Three times a day (TID) | ORAL | Status: DC

## 2013-05-22 NOTE — Consult Note (Signed)
[facility] Face-to-Face Psychiatry Consult   Reason for Consult:  Alcohol/cocaine relapse Referring Physician:  EDP  Franklin [name] is an 43 y.o. male. Total Time spent with patient: 20 minutes  Assessment: AXIS I:  Alcohol Abuse, Substance Abuse and Substance Induced Mood Disorder, anxiety AXIS II:  Deferred AXIS III:   Past Medical History  Diagnosis Date  . Depression   . Anxiety   . Bipolar 1 disorder   . No pertinent past medical history   . Schizophrenia    AXIS IV:  other psychosocial or environmental problems, problems related to social environment and problems with primary support group AXIS V:  70; mild symptoms  Plan:  Discharge home with follow-up with AA support groups and [facility].  Dr. [name] assessed the patient and concurs with the plan.  Subjective:   Franklin [name] is a 43 y.o. male patient does not warrant admission.  HPI:  Patient is ready to go home and get back to work.  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  He has decided to plan better in the future to maintain his appointments.  [name] also plans to attend AA support groups when he is in town to help him stay away from alcohol and drugs. HPI Elements:   Location:  generalized. Quality:  acute. Severity:  moderate. Timing:  constant. Duration:  two days. Context:  stressors.  Past Psychiatric History: Past Medical History  Diagnosis Date  . Depression   . Anxiety   . Bipolar 1 disorder   . No pertinent past medical history   . Schizophrenia     reports that he has been smoking Cigarettes.  He has been smoking about 1.00 pack per day. He has never used smokeless tobacco. He reports that he drinks about 7.2 ounces of alcohol per week. He reports that he uses illicit drugs ("Crack" cocaine). Family History  Problem Relation Age of Onset  . Hypertension Other   . Diabetes Other    Family History Substance  Abuse:  (Sisters) Family Supports: Yes, List: (Mother and sisters) Living Arrangements: Parent Can pt return to current living arrangement?: Yes Abuse/Neglect [facility]) Physical Abuse: Denies Verbal Abuse: Denies Sexual Abuse: Denies Allergies:  No Known Allergies  ACT Assessment Complete:  Yes:    Educational Status    Risk to Self: Risk to self Suicidal Ideation: No Suicidal Intent: No Is patient at risk for suicide?: No Suicidal Plan?: No Access to Means: No What has been your use of drugs/alcohol within the last 12 months?: daily  Previous Attempts/Gestures: Yes How many times?: 1 Other Self Harm Risks: none identified at this time Triggers for Past Attempts: Unknown Intentional Self Injurious Behavior: None Family Suicide History: No Recent stressful life event(s): Other (Comment) (Work related stress) Persecutory voices/beliefs?: No Depression: Yes Depression Symptoms: Isolating;Fatigue;Loss of interest in usual pleasures;Feeling worthless/self pity;Feeling angry/irritable Substance abuse history and/or treatment for substance abuse?: Yes Suicide prevention information given to non-admitted patients: Not applicable  Risk to Others: Risk to Others Homicidal Ideation: No Thoughts of Harm to Others: No Current Homicidal Intent: No Current Homicidal Plan: No Access to Homicidal Means: No Identified Victim: N/A History of harm to others?: No Assessment of Violence: None Noted Violent Behavior Description: No violent behavior reported Does patient have access to weapons?: No Criminal Charges Pending?: No Does patient have a court date: No  Abuse: Abuse/Neglect Assessment (Assessment to be complete while patient is  alone) Physical Abuse: Denies Verbal Abuse: Denies Sexual Abuse: Denies Exploitation of patient/patient's resources: Denies Self-Neglect: Denies  Prior Inpatient Therapy: Prior Inpatient Therapy Prior Inpatient Therapy: Yes Prior Therapy Dates: 2015 Prior  Therapy Facilty/Provider(s): [facility] Reason for Treatment: Detox, SI  Prior Outpatient Therapy: Prior Outpatient Therapy Prior Outpatient Therapy: No  Additional Information: Additional Information 1:1 In Past 12 Months?: No CIRT Risk: No Elopement Risk: No Does patient have medical clearance?: Yes                  Objective: Blood pressure 108/66, pulse 74, temperature 97.8 F (36.6 C), temperature source Oral, resp. rate 18, height $RemoveBe'5\' 10"'HvbimTunQ$  (1.778 m), weight 170 lb (77.111 kg), SpO2 97.00%.Body mass index is 24.39 kg/(m^2). Results for orders placed during the hospital encounter of 05/21/13 (from the past 72 hour(s))  URINE RAPID DRUG SCREEN (HOSP PERFORMED)     Status: Abnormal   Collection Time    05/21/13  4:47 AM      Result Value Ref Range   Opiates NONE DETECTED  NONE DETECTED   Cocaine POSITIVE (*) NONE DETECTED   Benzodiazepines NONE DETECTED  NONE DETECTED   Amphetamines NONE DETECTED  NONE DETECTED   Tetrahydrocannabinol NONE DETECTED  NONE DETECTED   Barbiturates NONE DETECTED  NONE DETECTED   Comment:            DRUG SCREEN FOR MEDICAL PURPOSES     ONLY.  IF CONFIRMATION IS NEEDED     FOR ANY PURPOSE, NOTIFY LAB     WITHIN 5 DAYS.                LOWEST DETECTABLE LIMITS     FOR URINE DRUG SCREEN     Drug Class       Cutoff (ng/mL)     Amphetamine      1000     Barbiturate      200     Benzodiazepine   638     Tricyclics       466     Opiates          300     Cocaine          300     THC              50  CBC     Status: None   Collection Time    05/21/13  5:00 AM      Result Value Ref Range   WBC 7.4  4.0 - 10.5 K/uL   RBC 5.01  4.22 - 5.81 MIL/uL   Hemoglobin 15.1  13.0 - 17.0 g/dL   HCT 43.0  39.0 - 52.0 %   MCV 85.8  78.0 - 100.0 fL   MCH 30.1  26.0 - 34.0 pg   MCHC 35.1  30.0 - 36.0 g/dL   RDW 14.3  11.5 - 15.5 %   Platelets 278  150 - 400 K/uL  COMPREHENSIVE METABOLIC PANEL     Status: Abnormal   Collection Time    05/21/13  5:00 AM       Result Value Ref Range   Sodium 140  137 - 147 mEq/L   Potassium 3.7  3.7 - 5.3 mEq/L   Chloride 99  96 - 112 mEq/L   CO2 25  19 - 32 mEq/L   Glucose, Bld 112 (*) 70 - 99 mg/dL   BUN 8  6 - 23 mg/dL   Creatinine, Ser 0.86  0.50 -  1.35 mg/dL   Calcium 9.6  8.4 - 10.5 mg/dL   Total Protein 7.4  6.0 - 8.3 g/dL   Albumin 4.1  3.5 - 5.2 g/dL   AST 20  0 - 37 U/L   ALT 15  0 - 53 U/L   Alkaline Phosphatase 67  39 - 117 U/L   Total Bilirubin 1.0  0.3 - 1.2 mg/dL   GFR calc non Af Amer >90  >90 mL/min   GFR calc Af Amer >90  >90 mL/min   Comment: (NOTE)     The eGFR has been calculated using the CKD EPI equation.     This calculation has not been validated in all clinical situations.     eGFR's persistently <90 mL/min signify possible Chronic Kidney     Disease.  ETHANOL     Status: Abnormal   Collection Time    05/21/13  5:00 AM      Result Value Ref Range   Alcohol, Ethyl (B) 18 (*) 0 - 11 mg/dL   Comment:            LOWEST DETECTABLE LIMIT FOR     SERUM ALCOHOL IS 11 mg/dL     FOR MEDICAL PURPOSES ONLY   Labs are reviewed and are pertinent for no medical issues noted.  Current Facility-Administered Medications  Medication Dose Route Frequency Provider Last Rate Last Dose  . buPROPion (WELLBUTRIN XL) 24 hr tablet 150 mg  150 mg Oral Daily Waylan Boga, NP      . gabapentin (NEURONTIN) capsule 200 mg  200 mg Oral TID Waylan Boga, NP      . hydrOXYzine (ATARAX/VISTARIL) tablet 25 mg  25 mg Oral TID PRN Waylan Boga, NP      . LORazepam (ATIVAN) tablet 0-4 mg  0-4 mg Oral 4 times per day Merryl Hacker, MD   4 mg at 05/21/13 1200   Followed by  . [START ON 05/23/2013] LORazepam (ATIVAN) tablet 0-4 mg  0-4 mg Oral Q12H Merryl Hacker, MD       Current Outpatient Prescriptions  Medication Sig Dispense Refill  . buPROPion (WELLBUTRIN XL) 150 MG 24 hr tablet Take 1 tablet (150 mg total) by mouth daily.  30 tablet  1  . gabapentin (NEURONTIN) 100 MG capsule Take 2 capsules  (200 mg total) by mouth 3 (three) times daily.  180 capsule  1  . hydrOXYzine (ATARAX/VISTARIL) 25 MG tablet Take 1 tablet (25 mg total) by mouth 3 (three) times daily as needed for anxiety.  30 tablet  0  . hydrOXYzine (VISTARIL) 25 MG capsule Take 1 capsule (25 mg total) by mouth 3 (three) times daily as needed.  30 capsule  0    Psychiatric Specialty Exam:     Blood pressure 108/66, pulse 74, temperature 97.8 F (36.6 C), temperature source Oral, resp. rate 18, height $RemoveBe'5\' 10"'cUUnAoaYc$  (1.778 m), weight 170 lb (77.111 kg), SpO2 97.00%.Body mass index is 24.39 kg/(m^2).  General Appearance: Casual  Eye Contact::  Good  Speech:  Normal Rate  Volume:  Normal  Mood:  Euthymic  Affect:  Congruent  Thought Process:  Coherent  Orientation:  Full (Time, Place, and Person)  Thought Content:  WDL  Suicidal Thoughts:  No  Homicidal Thoughts:  No  Memory:  Immediate;   Good Recent;   Good Remote;   Good  Judgement:  Fair  Insight:  Fair  Psychomotor Activity:  Normal  Concentration:  Good  Recall:  Good  Fund of Knowledge:Good  Language: Good  Akathisia:  No  Handed:  Right  AIMS (if indicated):     Assets:  Financial Resources/Insurance Physical Health Resilience Social Support Vocational/Educational  Sleep:      Musculoskeletal: Strength & Muscle Tone: within normal limits Gait & Station: normal Patient leans: N/A  Treatment Plan Summary: Discharge home with follow-up with AA support groups and [facility].  Waylan Boga, PMH-NP 05/22/2013 9:26 AM

## 2013-05-22 NOTE — Consult Note (Signed)
Face to face evaluation and I agree with this note 

## 2013-05-22 NOTE — [facility] Suicide Risk Assessment (Signed)
Suicide Risk Assessment  Discharge Assessment     Demographic Factors:  Male and Living alone  Total Time spent with patient: 15 minutes  Psychiatric Specialty Exam:     Blood pressure 108/66, pulse 74, temperature 97.8 F (36.6 C), temperature source Oral, resp. rate 18, height 5\' 10"  (1.778 m), weight 170 lb (77.111 kg), SpO2 97.00%.Body mass index is 24.39 kg/(m^2).  General Appearance: Casual  Eye Contact::  Good  Speech:  Normal Rate  Volume:  Normal  Mood:  Euthymic  Affect:  Congruent  Thought Process:  Coherent  Orientation:  Full (Time, Place, and Person)  Thought Content:  WDL  Suicidal Thoughts:  No  Homicidal Thoughts:  No  Memory:  Immediate;   Good Recent;   Good Remote;   Good  Judgement:  Fair  Insight:  Fair  Psychomotor Activity:  Normal  Concentration:  Good  Recall:  Good  Fund of Knowledge:Good  Language: Good  Akathisia:  No  Handed:  Right  AIMS (if indicated):     Assets:  Financial Resources/Insurance Physical Health Resilience Social Support Vocational/Educational  Sleep:      Musculoskeletal: Strength & Muscle Tone: within normal limits Gait & Station: normal Patient leans: N/A  Mental Status Per Nursing Assessment::   On Admission:   Patient relapsed on alcohol and cocaine on Saturday and came for assistance with detox.  Current Mental Status by Physician: NA  Loss Factors: NA  Historical Factors: NA  Risk Reduction Factors:   Sense of responsibility to family, Employed, Positive social support and Positive coping skills or problem solving skills  Continued Clinical Symptoms:  Alcohol dependency, Major depression  Cognitive Features That Contribute To Risk:  None  Suicide Risk:  Minimal: No identifiable suicidal ideation.  Patients presenting with no risk factors but with morbid ruminations; may be classified as minimal risk based on the severity of the depressive symptoms  Discharge Diagnoses:   AXIS I:  Alcohol  Abuse, Anxiety Disorder NOS, Substance Abuse and Substance Induced Mood Disorder AXIS II:  Deferred AXIS III:   Past Medical History  Diagnosis Date  . Depression   . Anxiety   . Bipolar 1 disorder   . No pertinent past medical history   . Schizophrenia    AXIS IV:  other psychosocial or environmental problems, problems related to social environment and problems with primary support group AXIS V:  61-70 mild symptoms  Plan Of Care/Follow-up recommendations:  Activity:  as tolerated Diet:  low-sodium heart healthy diet  Is patient on multiple antipsychotic therapies at discharge:  No   Has Patient had three or more failed trials of antipsychotic monotherapy by history:  No  Recommended Plan for Multiple Antipsychotic Therapies: NA    Nanine MeansJamison Danai Gotto, PMH-NP 05/22/2013, 9:32 AM

## 2013-12-16 ENCOUNTER — Emergency Department: Payer: Self-pay | Admitting: Emergency Medicine

## 2013-12-22 ENCOUNTER — Encounter (HOSPITAL_COMMUNITY): Payer: Self-pay | Admitting: *Deleted

## 2013-12-22 ENCOUNTER — Emergency Department (HOSPITAL_COMMUNITY)
Admission: EM | Admit: 2013-12-22 | Discharge: 2013-12-22 | Disposition: A | Discharge status: Home / Self Care | Payer: Self-pay | Attending: Emergency Medicine | Admitting: Emergency Medicine

## 2013-12-22 DIAGNOSIS — Z79899 Other long term (current) drug therapy: Secondary | ICD-10-CM | POA: Insufficient documentation

## 2013-12-22 DIAGNOSIS — Z792 Long term (current) use of antibiotics: Secondary | ICD-10-CM | POA: Insufficient documentation

## 2013-12-22 DIAGNOSIS — F209 Schizophrenia, unspecified: Secondary | ICD-10-CM | POA: Insufficient documentation

## 2013-12-22 DIAGNOSIS — L0201 Cutaneous abscess of face: Secondary | ICD-10-CM | POA: Insufficient documentation

## 2013-12-22 DIAGNOSIS — F419 Anxiety disorder, unspecified: Secondary | ICD-10-CM | POA: Insufficient documentation

## 2013-12-22 DIAGNOSIS — Z7689 Persons encountering health services in other specified circumstances: Secondary | ICD-10-CM

## 2013-12-22 DIAGNOSIS — F319 Bipolar disorder, unspecified: Secondary | ICD-10-CM | POA: Insufficient documentation

## 2013-12-22 DIAGNOSIS — Z720 Tobacco use: Secondary | ICD-10-CM | POA: Insufficient documentation

## 2013-12-22 DIAGNOSIS — Z0189 Encounter for other specified special examinations: Secondary | ICD-10-CM

## 2013-12-22 MED ORDER — DOXYCYCLINE HYCLATE 100 MG PO TABS
100.0000 mg | ORAL_TABLET | Freq: Once | ORAL | Status: AC
  Administered 2013-12-22: 100 mg via ORAL
  Filled 2013-12-22: qty 1

## 2013-12-22 MED ORDER — LIDOCAINE-EPINEPHRINE 2 %-1:100000 IJ SOLN
20.0000 mL | Freq: Once | INTRAMUSCULAR | Status: AC
  Administered 2013-12-22: 20 mL via INTRADERMAL
  Filled 2013-12-22: qty 1

## 2013-12-22 MED ORDER — DOXYCYCLINE HYCLATE 100 MG PO CAPS: 100.0000 mg | ORAL_CAPSULE | Freq: Two times a day (BID) | ORAL | Status: DC

## 2013-12-22 NOTE — ED Notes (Signed)
Pt reports cyst to R cheek x2 weeks. [facility]. Did not drain, given abx x1 week, minimal improvement.

## 2013-12-22 NOTE — ED Provider Notes (Signed)
CSN: [id number]     Arrival date & time 12/22/13  0803 History   First MD Initiated Contact with Patient 12/22/13 [phone]     Chief Complaint  Patient presents with  . Cyst     (Consider location/radiation/quality/duration/timing/severity/associated sxs/prior Treatment) HPI... Abscess on right cheek for approximately 2 weeks. Patient was initially placed on a unknown antibiotic.  Abscess has worsened. No fever or chills. No stiff neck. Patient has no chronic medical problems. He does have a psychiatric history that is noted.   Past Medical History  Diagnosis Date  . Depression   . Anxiety   . Bipolar 1 disorder   . No pertinent past medical history   . Schizophrenia    Past Surgical History  Procedure Laterality Date  . No past surgeries     Family History  Problem Relation Age of Onset  . Hypertension Other   . Diabetes Other    History  Substance Use Topics  . Smoking status: Current Every Day Smoker -- 1.00 packs/day    Types: Cigarettes  . Smokeless tobacco: Never Used  . Alcohol Use: 7.2 oz/week    12 Cans of beer per week     Comment: heavy 12 pk/day- as of 12/22/13, nothing in a month    Review of Systems  All other systems reviewed and are negative.     Allergies  Review of patient's allergies indicates no known allergies.  Home Medications   Prior to Admission medications   Medication Sig Start Date End Date Taking? Authorizing Provider  Multiple Vitamin (MULTIVITAMIN WITH MINERALS) TABS tablet Take 1 tablet by mouth daily.   Yes Historical Provider, MD  buPROPion (WELLBUTRIN XL) 150 MG 24 hr tablet Take 1 tablet (150 mg total) by mouth daily. Patient not taking: Reported on 12/22/2013 05/22/13   Nanine MeansJamison Lord, NP  doxycycline (VIBRAMYCIN) 100 MG capsule Take 1 capsule (100 mg total) by mouth 2 (two) times daily. One po bid x 7 days 12/22/13   Donnetta HutchingBrian Mycala Warshawsky, MD  gabapentin (NEURONTIN) 100 MG capsule Take 2 capsules (200 mg total) by mouth 3 (three) times  daily. Patient not taking: Reported on 12/22/2013 05/22/13   Nanine MeansJamison Lord, NP  hydrOXYzine (ATARAX/VISTARIL) 25 MG tablet Take 1 tablet (25 mg total) by mouth 3 (three) times daily as needed for anxiety. Patient not taking: Reported on 12/22/2013 05/22/13   Nanine MeansJamison Lord, NP  hydrOXYzine (VISTARIL) 25 MG capsule Take 1 capsule (25 mg total) by mouth 3 (three) times daily as needed. Patient not taking: Reported on 12/22/2013 05/22/13   Nanine MeansJamison Lord, NP   BP 140/87 mmHg  Pulse 64  Temp(Src) 97.8 F (36.6 C) (Oral)  Resp 15  SpO2 100% Physical Exam  Constitutional: He is oriented to person, place, and time. He appears well-nourished.  HENT:  Head: Normocephalic and atraumatic.  Pulmonary/Chest: Effort normal.  Neurological: He is alert and oriented to person, place, and time.  Skin:  Right cheek:  2.5 cm abscess protruding from cheek  Psychiatric: He has a normal mood and affect. His behavior is normal.  Nursing note and vitals reviewed.   ED Course  INCISION AND DRAINAGE Date/Time: 12/22/2013 11:04 AM Performed by: Donnetta HutchingOOK, Daniah Zaldivar Authorized by: Donnetta HutchingOOK, Marymargaret Kirker Comments: Wound was anesthetized with 2% Xylocaine with epinephrine via a field block. #11 stab blade made to central core of abscess. Small incision made. Copious amount of pus expressed. Wound was irrigated. 1/4 inch iodoform gauze place. Sterile dressing.   (including critical care time) Labs Review  Labs Reviewed - No data to display  Imaging Review No results found.   EKG Interpretation None      MDM   Final diagnoses:  Encounter for incision and drainage procedure    Incision and drainage of right cheek abscess with good success. Patient tolerated procedure well. Discharge medication doxycycline 100 mg twice a day.    Donnetta HutchingBrian Beau Vanduzer, MD 12/22/13 1106

## 2013-12-22 NOTE — ED Notes (Signed)
Bed: [facility] Expected date:  Expected time:  Means of arrival:  Comments: 

## 2013-12-22 NOTE — Discharge Instructions (Signed)
Recheck in 2 days. If packing falls out, that is okay. Can take a shower. Rinse with soap and water. Simple dressing. Prescription for antibiotic.

## 2014-04-26 NOTE — Consult Note (Signed)
Brief Consult Note: Diagnosis: alcohol and cocaine dep, depression nos.   Patient was seen by consultant.   Consult note dictated.   Recommend further assessment or treatment.   Orders entered.   Comments: Psychiatry: Patient completed detox. Vital signs are stable. He was restarted on [facility] as [facility] and tolerated them well. he is not suicidal or homicidal.   PLAN: 1. The patient does not meet criteria for IVC. Please discharge as appropriate.  2. He will continue Bupriprion, Neurontin and Trazodone for sleep/depresion. Rx given.  3. [facility].  4. His sister will pick him up. He will stay with his mother.  Electronic Signatures: Kristine LineaPucilowska, Chesnee Floren (MD)  (Signed 25-Aug-14 10:55)  Authored: Brief Consult Note   Last Updated: 25-Aug-14 10:55 by Kristine LineaPucilowska, Titilayo Hagans (MD)

## 2014-04-26 NOTE — Consult Note (Signed)
Brief Consult Note: Diagnosis: alcohol and cocaine dep, depression nos.   Patient was seen by consultant.   Consult note dictated.   Recommend further assessment or treatment.   Orders entered.   Comments: Psychiatry: Patient seen. Alcohol and cocaine dependence and depression. In need of detox treatment. No current beds availible on the unit. PAtient aware. CIWA orders done. Restarted antidepresssants. Will follow.  Electronic Signatures: Audery Amellapacs, John T (MD)  (Signed 22-Aug-14 18:27)  Authored: Brief Consult Note   Last Updated: 22-Aug-14 18:27 by Audery Amellapacs, John T (MD)

## 2014-04-26 NOTE — Consult Note (Signed)
PATIENT NAME:  Franklin [name], Franklin MR#:  [id number] DATE OF BIRTH:  10-Mar-1970  DATE OF CONSULTATION:  08/25/2012  REFERRING PHYSICIAN:   CONSULTING PHYSICIAN:  Audery AmelJohn T. Clapacs, MD  IDENTIFYING INFORMATION AND REASON FOR CONSULTATION:  A 44 year old man who came to the Emergency Room stating "anxiety and depression pretty bad."  Also abusing alcohol and cocaine and requesting detox.  Consultation for psychiatric treatment.   HISTORY OF PRESENT ILLNESS:  Information obtained from the patient and the chart.  He came to the Emergency Room stating that he has been feeling anxious and depressed and his depression has been getting worse and worse.  His mood feels down most of the time.  Feels very negative about himself.  Feels sedated much of the time.  Feels a lot of guilt and has started to have thoughts about wishing he would die.  He relapsed into using cocaine and alcohol about a month ago.  He says he has been drinking about a case a day of beer and using up to $100 a day of cocaine.  He has been unable to stop on his own.  He is not currently taking medication for depression or anxiety.  He lives with his sister.  Not working.   PAST PSYCHIATRIC HISTORY:  The patient has had previous inpatient admissions at our hospital at [facility] and at [facility].  He has been in substance abuse treatment several times in the past.  He says that he has been able to maintain sobriety sometimes for up to years at a time, but recently has had a hard time getting sober.  He has some paperwork with him from a previous hospitalization that indicates his prior medicine was Wellbutrin, Neurontin and trazodone.  He does not remember any other medication, but he thinks those were helpful for his mood.  The patient says that he did try to kill himself by hanging a couple years ago.   PAST MEDICAL HISTORY:  He has no significant known ongoing medical problems.   SOCIAL HISTORY:  Lives with his sister.  Not working.  No income.   Separated and estranged from his children.  Minimal support.   FAMILY HISTORY:  He says that his grandmother and several aunts had schizophrenia diagnoses.   CURRENT MEDICATIONS:  None.   ALLERGIES:  No known drug allergies.   SUBSTANCE ABUSE HISTORY:  He denies ever having had a seizure from withdrawal and is vague about whether he has had DTs.  Primarily uses crack cocaine and alcohol.  Infrequently uses any other drugs of abuse by his claim.   REVIEW OF SYSTEMS:  Depression, low mood, negative thinking about himself.  Fatigue.  Lack of interest in usual activities.  Suicidal ideation.  Denies psychotic symptoms.  Denies any specific physical symptoms currently.   MENTAL STATUS EXAMINATION:  Reasonably well-groomed gentleman, looks his stated age.  Passive, but cooperative with the interview.  Eye contact adequate.  Psychomotor activity slow and sluggish.  He stays lying down on his back for most of the interview.  Speech is quiet and decreased in amount.  Affect blunted and dysphoric, but not tearful.  Mood stated as being depressed.  Thoughts lucid, but slow.  No sign of delusional thinking or loosening of associations.  Denies suicidal or homicidal intent, but says he has recently had some thoughts of wishing he were dead.  Insight and judgment adequate to the situation.  Normal intelligence.  Alert and oriented x 4.   PHYSICAL EXAMINATION: Full physical  not completed for the consult, but the patient does not appear to be in any acute physical distress.  He was able to stand and walk normally and appeared to have full range of motion at all extremities.  He denied any acute physical symptoms.  Stated that his eyesight was adequate and symmetric.  His most recent vital signs are documented as a temperature of 97.3, pulse 65, respirations 18, blood pressure 113/57.   LABORATORY RESULTS:  Drug screen positive for cocaine.  Urinalysis positive for red blood, but does not appear infected.  Positive  for ketones.  TSH normal.  Alcohol level was 19.  Chemistry panel, slightly elevated glucose 127.  CBC normal.   ASSESSMENT:  A 44 year old man with alcohol and cocaine dependence and symptoms of depression and anxiety.  Multiple social stresses, poor functioning.  He was offered the opportunity of going to [facility] from the Emergency Room, but declines it stating he has had bad experiences there in the past.  Because of his suicidal ideation and possible history of delirium tremens as well as multiple stresses, is appropriate for inpatient level treatment.   TREATMENT PLAN:  Currently, the behavioral health unit is full and beds are not anticipated to open up this weekend.  The patient was educated about this.  He will be put on the standard alcohol withdrawal protocol.  He will be monitored regularly for signs of withdrawal.  I went ahead and put him back on bupropion 150 mg extended release a day as well as trazodone and Neurontin 300 mg 3 times a day.  The patient eventually could be admitted to the hospital or if his condition improves could ultimately be discharged from the Emergency Room if appropriate.  Right now he is under IVC, I believe.   DIAGNOSIS, PRINCIPAL AND PRIMARY:  AXIS I:  Alcohol dependence.   SECONDARY DIAGNOSES: AXIS I:   1.  Cocaine dependence.  2.  Depression, not otherwise specified, rule out substance-induced mood disorder versus major depression.  AXIS II:  Deferred.  AXIS III:  No diagnosis.  AXIS IV:  Severe from poor social functioning, lack of resources.  AXIS V:  Functioning at time of evaluation 35.     ____________________________ Audery Amel, MD jtc:ea D: 08/25/2012 23:50:44 ET T: 08/26/2012 00:25:04 ET JOB#: [id number]  cc: Audery Amel, MD, <Dictator> Audery Amel MD ELECTRONICALLY SIGNED 08/28/2012 9:25

## 2014-06-17 ENCOUNTER — Emergency Department (HOSPITAL_COMMUNITY)
Admission: EM | Admit: 2014-06-17 | Discharge: 2014-06-18 | Disposition: A | Discharge status: Other Acute Inpatient Hospital | Payer: Self-pay | Attending: Emergency Medicine | Admitting: Emergency Medicine

## 2014-06-17 ENCOUNTER — Encounter (HOSPITAL_COMMUNITY): Payer: Self-pay | Admitting: Emergency Medicine

## 2014-06-17 DIAGNOSIS — F209 Schizophrenia, unspecified: Secondary | ICD-10-CM | POA: Insufficient documentation

## 2014-06-17 DIAGNOSIS — R45851 Suicidal ideations: Secondary | ICD-10-CM

## 2014-06-17 DIAGNOSIS — F1910 Other psychoactive substance abuse, uncomplicated: Secondary | ICD-10-CM

## 2014-06-17 DIAGNOSIS — Z720 Tobacco use: Secondary | ICD-10-CM | POA: Insufficient documentation

## 2014-06-17 DIAGNOSIS — F329 Major depressive disorder, single episode, unspecified: Secondary | ICD-10-CM

## 2014-06-17 DIAGNOSIS — F419 Anxiety disorder, unspecified: Secondary | ICD-10-CM | POA: Insufficient documentation

## 2014-06-17 DIAGNOSIS — F1210 Cannabis abuse, uncomplicated: Secondary | ICD-10-CM | POA: Insufficient documentation

## 2014-06-17 DIAGNOSIS — F32A Depression, unspecified: Secondary | ICD-10-CM

## 2014-06-17 DIAGNOSIS — F1410 Cocaine abuse, uncomplicated: Secondary | ICD-10-CM | POA: Insufficient documentation

## 2014-06-17 DIAGNOSIS — R443 Hallucinations, unspecified: Secondary | ICD-10-CM

## 2014-06-17 DIAGNOSIS — Z9119 Patient's noncompliance with other medical treatment and regimen: Secondary | ICD-10-CM | POA: Insufficient documentation

## 2014-06-17 DIAGNOSIS — F319 Bipolar disorder, unspecified: Secondary | ICD-10-CM | POA: Insufficient documentation

## 2014-06-17 DIAGNOSIS — Z79899 Other long term (current) drug therapy: Secondary | ICD-10-CM | POA: Insufficient documentation

## 2014-06-17 LAB — ETHANOL: Alcohol, Ethyl (B): <5 mg/dL (ref <5)

## 2014-06-17 LAB — RAPID URINE DRUG SCREEN, HOSP PERFORMED
Amphetamines: NOT DETECTED
Barbiturates: NOT DETECTED
Benzodiazepines: NOT DETECTED
Cocaine: POSITIVE — AB
Opiates: NOT DETECTED
Tetrahydrocannabinol: POSITIVE — AB

## 2014-06-17 LAB — CBC
HCT: 46.6 % (ref 39.0–52.0)
Hemoglobin: 16 g/dL (ref 13.0–17.0)
MCH: 30.6 pg (ref 26.0–34.0)
MCHC: 34.3 g/dL (ref 30.0–36.0)
MCV: 89.1 fL (ref 78.0–100.0)
Platelets: 333 10*3/uL (ref 150–400)
RBC: 5.23 MIL/uL (ref 4.22–5.81)
RDW: 14 % (ref 11.5–15.5)
WBC: 6.9 10*3/uL (ref 4.0–10.5)

## 2014-06-17 LAB — SALICYLATE LEVEL: Salicylate Lvl: <4 mg/dL (ref 2.8–30.0)

## 2014-06-17 LAB — COMPREHENSIVE METABOLIC PANEL
ALT: 29 U/L (ref 17–63)
AST: 25 U/L (ref 15–41)
Albumin: 4.6 g/dL (ref 3.5–5.0)
Alkaline Phosphatase: 60 U/L (ref 38–126)
Anion gap: 8 (ref 5–15)
BUN: 10 mg/dL (ref 6–20)
CO2: 26 mmol/L (ref 22–32)
Calcium: 9.4 mg/dL (ref 8.9–10.3)
Chloride: 102 mmol/L (ref 101–111)
Creatinine, Ser: 0.91 mg/dL (ref 0.61–1.24)
GFR calc Af Amer: >60 mL/min (ref >60)
GFR calc non Af Amer: >60 mL/min (ref >60)
Glucose, Bld: 181 mg/dL — ABNORMAL HIGH (ref 65–99)
Potassium: 3.6 mmol/L (ref 3.5–5.1)
Sodium: 136 mmol/L (ref 135–145)
Total Bilirubin: 1.8 mg/dL — ABNORMAL HIGH (ref 0.3–1.2)
Total Protein: 8.1 g/dL (ref 6.5–8.1)

## 2014-06-17 LAB — ACETAMINOPHEN LEVEL: Acetaminophen (Tylenol), Serum: <10 ug/mL — ABNORMAL LOW (ref 10–30)

## 2014-06-17 MED ORDER — LORAZEPAM 1 MG PO TABS
1.0000 mg | ORAL_TABLET | Freq: Three times a day (TID) | ORAL | Status: DC | PRN
  Administered 2014-06-17 – 2014-06-18 (×3): 1 mg via ORAL
  Filled 2014-06-17 (×3): qty 1

## 2014-06-17 MED ORDER — ONDANSETRON HCL 4 MG PO TABS
4.0000 mg | ORAL_TABLET | Freq: Three times a day (TID) | ORAL | Status: DC | PRN
  Administered 2014-06-17: 4 mg via ORAL
  Filled 2014-06-17: qty 1

## 2014-06-17 MED ORDER — HYDROXYZINE HCL 25 MG PO TABS
25.0000 mg | ORAL_TABLET | Freq: Three times a day (TID) | ORAL | Status: DC | PRN
  Administered 2014-06-18 (×2): 25 mg via ORAL
  Filled 2014-06-17 (×2): qty 1

## 2014-06-17 MED ORDER — ZOLPIDEM TARTRATE 5 MG PO TABS: 5.0000 mg | ORAL_TABLET | Freq: Every evening | ORAL | Status: DC | PRN

## 2014-06-17 MED ORDER — ALUM & MAG HYDROXIDE-SIMETH 200-200-20 MG/5ML PO SUSP: 30.0000 mL | ORAL | Status: DC | PRN

## 2014-06-17 MED ORDER — ACETAMINOPHEN 325 MG PO TABS: 650.0000 mg | ORAL_TABLET | ORAL | Status: DC | PRN

## 2014-06-17 MED ORDER — IBUPROFEN 200 MG PO TABS: 600.0000 mg | ORAL_TABLET | Freq: Three times a day (TID) | ORAL | Status: DC | PRN

## 2014-06-17 MED ORDER — NICOTINE 21 MG/24HR TD PT24
21.0000 mg | MEDICATED_PATCH | Freq: Every day | TRANSDERMAL | Status: DC
  Administered 2014-06-17 – 2014-06-18 (×2): 21 mg via TRANSDERMAL
  Filled 2014-06-17 (×2): qty 1

## 2014-06-17 MED ORDER — ADULT MULTIVITAMIN W/MINERALS CH
1.0000 | ORAL_TABLET | Freq: Every day | ORAL | Status: DC
  Administered 2014-06-17 – 2014-06-18 (×2): 1 via ORAL
  Filled 2014-06-17 (×2): qty 1

## 2014-06-17 NOTE — ED Notes (Signed)
Pt belongings bag placed under nurses station outside of rm 4

## 2014-06-17 NOTE — Progress Notes (Signed)
Pt noted to be in room with bathroom door closed and not willing to have door remain open per [facility]/TCU rules  Security present

## 2014-06-17 NOTE — BH Assessment (Signed)
Franklin [name] (TTS Therapist) has been informed of the consult.

## 2014-06-17 NOTE — ED Notes (Signed)
Bed: WA27 Expected date:  Expected time:  Means of arrival:  Comments: Margo Aye A @3p 

## 2014-06-17 NOTE — BH Assessment (Addendum)
Assessment Note   Franklin [name] is an 44 y.o. male who came to the Emergency Department with thoughts of suicide by hanging self. Pt states that he attempted to hang himself 5 years ago but the branch broke. He states that he has been dealing with depression for years and has been inpatient several times with the last admission being in 2015. He states that he has also been using crack/cocaine and alcohol since he was in his early 20's. He states that he is using almost a case of beer daily and is using around $200 worth of crack every other day. He reports having nausea and anxiety in the ED from withdrawal. Pt states that he has been off his medication for over 6 months but was receiving services from [facility]. He endorses depression and states he has been isolating and sleeping all day lately. When he gets up he goes on crack binges and stays up for days. Pt states that he is hearing voices but "can not tell what they are saying". He states that he is also having some mild visual hallucinations of "lines of light". Pt is paranoid and asks "why is that nurse staring at me". He states that he is becoming more paranoid and feels that he needs to get back on his medications. Pt reports having a history of being in prison for a few years in his early 20's which was a traumatic experience for him. No childhood abuse noted. He states that his grandfather committed suicide when he was younger and has a family history of substance abuse and mental health issues. He is currently living with his mom in [place] but feels that he needs to be closer to resources and a bus route. Pt denies HI at this time.   Disposition: Per Claudette Head NP inpatient recommended.   Axis I: 304.20 Cocaine use disorder severe, 303.90 Alcohol Use Disorder severe, 296.33 Major Depressive Disorder, Recurrent Severe  Axis II: Deferred Axis III:  Past Medical History  Diagnosis Date  . Depression   . Anxiety   . Bipolar 1 disorder    . No pertinent past medical history   . Schizophrenia    Axis IV: economic problems and other psychosocial or environmental problems Axis V: 31-40 impairment in reality testing  Past Medical History:  Past Medical History  Diagnosis Date  . Depression   . Anxiety   . Bipolar 1 disorder   . No pertinent past medical history   . Schizophrenia     Past Surgical History  Procedure Laterality Date  . No past surgeries      Family History:  Family History  Problem Relation Age of Onset  . Hypertension Other   . Diabetes Other     Social History:  reports that he has been smoking Cigarettes.  He has been smoking about 1.00 pack per day. He has never used smokeless tobacco. He reports that he drinks about 7.2 oz of alcohol per week. He reports that he uses illicit drugs ("Crack" cocaine).  Additional Social History:  Alcohol / Drug Use History of alcohol / drug use?: Yes Substance #1 Name of Substance 1: Alcohol  1 - Age of First Use: 15 1 - Amount (size/oz): 24 beers  1 - Frequency: daily  1 - Duration: off and on since 15 1 - Last Use / Amount: 3:00am this morning  Substance #2 Name of Substance 2: Crack/Cocaine 2 - Age of First Use: 20 2 - Amount (size/oz): $200.00 at  a time 2 - Frequency: every 3 days  2 - Duration: unspecified 2 - Last Use / Amount: yesterday   CIWA: CIWA-Ar BP: 157/81 mmHg Pulse Rate: 69 COWS:    PATIENT STRENGTHS: (choose at least two) Average or above average intelligence General fund of knowledge Physical Health  Allergies: No Known Allergies  Home Medications:  (Not in a hospital admission)  OB/GYN Status:  No LMP for male patient.  General Assessment Data Location of Assessment: [facility] TTS Assessment: In system Is this a Tele or Face-to-Face Assessment?: Face-to-Face Is this an Initial Assessment or a Re-assessment for this encounter?: Initial Assessment Marital status: Single Is patient pregnant?: No Pregnancy Status:  No Living Arrangements: Parent Can pt return to current living arrangement?: Yes Admission Status: Voluntary Is patient capable of signing voluntary admission?: No Referral Source: Self/Family/Friend Insurance type:  (None)     Crisis Care Plan Living Arrangements: Parent Name of Psychiatrist: Vesta Mixer Name of Therapist: None  Education Status Is patient currently in school?: No Highest grade of school patient has completed: 12th  Risk to self with the past 6 months Suicidal Ideation: Yes-Currently Present Has patient been a risk to self within the past 6 months prior to admission? : Yes Suicidal Intent: Yes-Currently Present Has patient had any suicidal intent within the past 6 months prior to admission? : Yes Is patient at risk for suicide?: Yes Suicidal Plan?: Yes-Currently Present Has patient had any suicidal plan within the past 6 months prior to admission? : Yes Specify Current Suicidal Plan: hang self- history of attempting to hang self Access to Means: Yes What has been your use of drugs/alcohol within the last 12 months?: uses crack and alcohol  Previous Attempts/Gestures: Yes How many times?: 1 Other Self Harm Risks:  (drug use) Triggers for Past Attempts: Unpredictable Intentional Self Injurious Behavior: None Family Suicide History: Yes (grandfather) Recent stressful life event(s): Other (Comment) (decline in mental health, depression) Persecutory voices/beliefs?: Yes Depression: Yes Depression Symptoms: Despondent, Isolating, Feeling worthless/self pity Substance abuse history and/or treatment for substance abuse?: Yes Suicide prevention information given to non-admitted patients: Not applicable  Risk to Others within the past 6 months Homicidal Ideation: No Does patient have any lifetime risk of violence toward others beyond the six months prior to admission? : No Thoughts of Harm to Others: No Current Homicidal Intent: No Current Homicidal Plan:  No Access to Homicidal Means: No Identified Victim:  (None) History of harm to others?: No Assessment of Violence: None Noted Violent Behavior Description: none Does patient have access to weapons?: No Criminal Charges Pending?: No Does patient have a court date: No Is patient on probation?: No  Psychosis Hallucinations: Auditory, Visual Delusions: Unspecified  Mental Status Report Appearance/Hygiene: In scrubs Eye Contact: Good Motor Activity: Freedom of movement Speech: Logical/coherent Level of Consciousness: Quiet/awake Mood: Suspicious Affect: Appropriate to circumstance Anxiety Level: Moderate Thought Processes: Coherent Judgement: Partial Orientation: Person, Place, Time, Situation Obsessive Compulsive Thoughts/Behaviors: None  Cognitive Functioning Concentration: Decreased Memory: Recent Intact, Remote Intact IQ: Average Insight: Fair Impulse Control: Fair Appetite: Fair Weight Loss: 0 Weight Gain: 0 Sleep:  ("up and down") Total Hours of Sleep:  (unspecified) Vegetative Symptoms: None  ADLScreening [facility] Assessment Services) Patient's cognitive ability adequate to safely complete daily activities?: Yes Patient able to express need for assistance with ADLs?: Yes Independently performs ADLs?: Yes (appropriate for developmental age)  Prior Inpatient Therapy Prior Inpatient Therapy: Yes Prior Therapy Dates: 2015 Prior Therapy Facilty/Provider(s): [facility] Reason for Treatment: Depression  Prior Outpatient Therapy Prior Outpatient Therapy: Yes Prior Therapy Dates: ongoing Prior Therapy Facilty/Provider(s): [facility] Reason for Treatment: Depression, substance abuse Does patient have an ACCT team?: No Does patient have Intensive In-House Services?  : No Does patient have [facility] services? : Yes Does patient have P4[facility] services?: No  ADL Screening (condition at time of admission) Patient's cognitive ability adequate to safely complete daily activities?:  Yes Is the patient deaf or have difficulty hearing?: No Does the patient have difficulty seeing, even when wearing glasses/contacts?: No Does the patient have difficulty concentrating, remembering, or making decisions?: No Patient able to express need for assistance with ADLs?: Yes Does the patient have difficulty dressing or bathing?: No Independently performs ADLs?: Yes (appropriate for developmental age) Does the patient have difficulty walking or climbing stairs?: No Weakness of Legs: None Weakness of Arms/Hands: None  Home Assistive Devices/Equipment Home Assistive Devices/Equipment: None  Therapy Consults (therapy consults require a physician order) PT Evaluation Needed: No OT Evalulation Needed: No SLP Evaluation Needed: No Abuse/Neglect Assessment (Assessment to be complete while patient is alone) Physical Abuse: Denies Verbal Abuse: Denies Sexual Abuse: Denies Exploitation of patient/patient's resources: Denies Self-Neglect: Denies Values / Beliefs Cultural Requests During Hospitalization: None Spiritual Requests During Hospitalization: None Consults Spiritual Care Consult Needed: No Social Work Consult Needed: No Merchant navy officer (For Healthcare) Does patient have an advance directive?: No Would patient like information on creating an advanced directive?: No - patient declined information    Additional Information 1:1 In Past 12 Months?: No CIRT Risk: No Elopement Risk: No Does patient have medical clearance?: Yes     Disposition:  Disposition Initial Assessment Completed for this Encounter: Yes Disposition of Patient: Inpatient treatment program Type of inpatient treatment program: Adult  Arlisha Patalano 06/17/2014 3:29 PM

## 2014-06-17 NOTE — ED Notes (Addendum)
Pt wants detox from etoh and crack, last use yesterday. Pt has thought of SI with plan of hanging self.

## 2014-06-17 NOTE — ED Provider Notes (Signed)
CSN: [id number]     Arrival date & time 06/17/14  1244 History   First MD Initiated Contact with Patient 06/17/14 1331     Chief Complaint  Patient presents with  . detox   . Suicidal     (Consider location/radiation/quality/duration/timing/severity/associated sxs/prior Treatment) HPI Comments: Franklin [name] is a 44 y.o. male with a PMHx of depression, anxiety, bipolar 1, and schizophrenia, who presents to the ED with complaints of suicidal ideations with a plan to hang himself, auditory and visual hallucinations, and crack and alcohol use yesterday. He has a history of prior suicide attempts "many years ago" by hanging, and multiple prior hospitalizations for mental health. He reports that he is hallucinating hearing voices without direct messages, and seeing human outlines. He drink a 24 pack of beer yesterday, last sip at 3 AM, and endorses crack use yesterday but none today. He has not been on any psychoactive medications in more than one month, previously on Wellbutrin and gabapentin as well as Vistaril. He smokes 1 pack per day. He is here voluntarily. He denies any medical complaints at this time.  Patient is a 44 y.o. male presenting with mental health disorder. The history is provided by the patient. No language interpreter was used.  Mental Health Problem Presenting symptoms: depression, hallucinations and suicidal thoughts   Presenting symptoms: no homicidal ideas   Patient accompanied by:  Law enforcement Onset quality:  Gradual Timing:  Constant Progression:  Unchanged Chronicity:  Chronic Context: noncompliance   Treatment compliance:  Untreated Time since last psychoactive medication taken:  1 month Relieved by:  None tried Worsened by:  Drugs and alcohol Ineffective treatments:  None tried Associated symptoms: no abdominal pain and no chest pain   Risk factors: hx of mental illness and hx of suicide attempts     Past Medical History  Diagnosis Date  . Depression   .  Anxiety   . Bipolar 1 disorder   . No pertinent past medical history   . Schizophrenia    Past Surgical History  Procedure Laterality Date  . No past surgeries     Family History  Problem Relation Age of Onset  . Hypertension Other   . Diabetes Other    History  Substance Use Topics  . Smoking status: Current Every Day Smoker -- 1.00 packs/day    Types: Cigarettes  . Smokeless tobacco: Never Used  . Alcohol Use: 7.2 oz/week    12 Cans of beer per week     Comment: heavy 12 pk/day- as of 12/22/13, nothing in a month    Review of Systems  Constitutional: Negative for fever and chills.  Respiratory: Negative for shortness of breath.   Cardiovascular: Negative for chest pain.  Gastrointestinal: Negative for nausea, vomiting, abdominal pain, diarrhea and constipation.  Genitourinary: Negative for dysuria and hematuria.  Musculoskeletal: Negative for myalgias and arthralgias.  Skin: Negative for rash.  Allergic/Immunologic: Negative for immunocompromised state.  Neurological: Negative for weakness and numbness.  Psychiatric/Behavioral: Positive for suicidal ideas and hallucinations. Negative for homicidal ideas and confusion.   10 Systems reviewed and are negative for acute change except as noted in the HPI.    Allergies  Review of patient's allergies indicates no known allergies.  Home Medications   Prior to Admission medications   Medication Sig Start Date End Date Taking? Authorizing Provider  buPROPion (WELLBUTRIN XL) 150 MG 24 hr tablet Take 1 tablet (150 mg total) by mouth daily. Patient not taking: Reported on 12/22/2013 05/22/13  Charm Rings, NP  gabapentin (NEURONTIN) 100 MG capsule Take 2 capsules (200 mg total) by mouth 3 (three) times daily. Patient not taking: Reported on 12/22/2013 05/22/13   Charm Rings, NP  hydrOXYzine (ATARAX/VISTARIL) 25 MG tablet Take 1 tablet (25 mg total) by mouth 3 (three) times daily as needed for anxiety. Patient not taking:  Reported on 12/22/2013 05/22/13   Charm Rings, NP  Multiple Vitamin (MULTIVITAMIN WITH MINERALS) TABS tablet Take 1 tablet by mouth daily.    Historical Provider, MD   BP 154/83 mmHg  Pulse 70  Temp(Src) 98.2 F (36.8 C) (Oral)  Resp 16  SpO2 97% Physical Exam  Constitutional: He is oriented to person, place, and time. Vital signs are normal. He appears well-developed and well-nourished.  Non-toxic appearance. No distress.  Afebrile, nontoxic, NAD  HENT:  Head: Normocephalic and atraumatic.  Mouth/Throat: Oropharynx is clear and moist and mucous membranes are normal.  Eyes: Conjunctivae and EOM are normal. Right eye exhibits no discharge. Left eye exhibits no discharge.  Neck: Normal range of motion. Neck supple.  Cardiovascular: Normal rate, regular rhythm, normal heart sounds and intact distal pulses.  Exam reveals no gallop and no friction rub.   No murmur heard. Pulmonary/Chest: Effort normal and breath sounds normal. No respiratory distress. He has no decreased breath sounds. He has no wheezes. He has no rhonchi. He has no rales.  Abdominal: Soft. Normal appearance and bowel sounds are normal. He exhibits no distension. There is no tenderness. There is no rigidity, no rebound, no guarding, no CVA tenderness, no tenderness at McBurney's point and negative Murphy's sign.  Musculoskeletal: Normal range of motion.  Neurological: He is alert and oriented to person, place, and time. He has normal strength. No sensory deficit. Gait normal.  Goal oriented speech, gait steady, no tremors, no focal neuro deficits  Skin: Skin is warm, dry and intact. No rash noted.  Psychiatric: He is actively hallucinating. He exhibits a depressed mood. He expresses suicidal ideation. He expresses no homicidal ideation. He expresses suicidal plans. He expresses no homicidal plans.  Endorsing SI with plan, and auditory and visual hallucinations. Depressed affect.  Nursing note and vitals reviewed.   ED  Course  Procedures (including critical care time) Labs Review Labs Reviewed  ACETAMINOPHEN LEVEL - Abnormal; Notable for the following:    Acetaminophen (Tylenol), Serum <10 (*)    All other components within normal limits  COMPREHENSIVE METABOLIC PANEL - Abnormal; Notable for the following:    Glucose, Bld 181 (*)    Total Bilirubin 1.8 (*)    All other components within normal limits  CBC  ETHANOL  SALICYLATE LEVEL  URINE RAPID DRUG SCREEN, HOSP PERFORMED    Imaging Review No results found.   EKG Interpretation None      MDM   Final diagnoses:  Depression  Suicidal ideations  Polysubstance abuse  Hallucinations    44 y.o. male here with SI with a plan to hang himself. Prior suicide attempt many yrs ago. EtOH yesterday, and crack yesterday. No medical complaints. +Auditory and visual hallucinations. Noncompliant on meds for >1 month. Labs showing APAP level WNL, CBC WNL, CMP with mildly elevated glucose and bili which have been present before. EtOH level <5. ASA level WNL. Awaiting UDS. Medically cleared otherwise. Will consult TTS and place psych hold orders. Reordered home meds except wellbutrin and gabapentin in order to avoid severe side effects from starting them back at prior dosing, will let psych team evaluate  his need for meds. Pt here voluntarily, if he attempted to leave he would need IVC papers. Please see [facility] notes for ongoing documentation of care.   BP 154/83 mmHg  Pulse 70  Temp(Src) 98.2 F (36.8 C) (Oral)  Resp 16  SpO2 97%  Meds ordered this encounter  Medications  . hydrOXYzine (ATARAX/VISTARIL) tablet 25 mg    Sig:   . multivitamin with minerals tablet 1 tablet    Sig:   . alum & mag hydroxide-simeth (MAALOX/MYLANTA) 200-200-20 MG/5ML suspension 30 mL    Sig:   . ondansetron (ZOFRAN) tablet 4 mg    Sig:   . nicotine (NICODERM CQ - dosed in mg/24 hours) patch 21 mg    Sig:   . zolpidem (AMBIEN) tablet 5 mg    Sig:   . ibuprofen  (ADVIL,MOTRIN) tablet 600 mg    Sig:   . acetaminophen (TYLENOL) tablet 650 mg    Sig:   . LORazepam (ATIVAN) tablet 1 mg    Sig:      Allen Derry, PA-C 06/17/14 1417  Richardean Canal, MD 06/17/14 [phone]

## 2014-06-18 ENCOUNTER — Inpatient Hospital Stay (HOSPITAL_COMMUNITY)
Admission: AD | Admit: 2014-06-18 | Discharge: 2014-06-19 | DRG: 885 | Disposition: A | Discharge status: Home / Self Care | Payer: Federal, State, Local not specified - Other | Source: Intra-hospital | Attending: Psychiatry | Admitting: Psychiatry

## 2014-06-18 ENCOUNTER — Encounter (HOSPITAL_COMMUNITY): Payer: Self-pay | Admitting: *Deleted

## 2014-06-18 DIAGNOSIS — R45851 Suicidal ideations: Secondary | ICD-10-CM | POA: Diagnosis present

## 2014-06-18 DIAGNOSIS — Z833 Family history of diabetes mellitus: Secondary | ICD-10-CM | POA: Diagnosis not present

## 2014-06-18 DIAGNOSIS — G4700 Insomnia, unspecified: Secondary | ICD-10-CM | POA: Diagnosis present

## 2014-06-18 DIAGNOSIS — F332 Major depressive disorder, recurrent severe without psychotic features: Principal | ICD-10-CM | POA: Insufficient documentation

## 2014-06-18 DIAGNOSIS — F17210 Nicotine dependence, cigarettes, uncomplicated: Secondary | ICD-10-CM | POA: Diagnosis present

## 2014-06-18 DIAGNOSIS — F410 Panic disorder [episodic paroxysmal anxiety] without agoraphobia: Secondary | ICD-10-CM | POA: Diagnosis present

## 2014-06-18 DIAGNOSIS — F1490 Cocaine use, unspecified, uncomplicated: Secondary | ICD-10-CM | POA: Diagnosis not present

## 2014-06-18 DIAGNOSIS — Z8249 Family history of ischemic heart disease and other diseases of the circulatory system: Secondary | ICD-10-CM | POA: Diagnosis not present

## 2014-06-18 DIAGNOSIS — F1410 Cocaine abuse, uncomplicated: Secondary | ICD-10-CM | POA: Diagnosis present

## 2014-06-18 DIAGNOSIS — F1420 Cocaine dependence, uncomplicated: Secondary | ICD-10-CM | POA: Diagnosis not present

## 2014-06-18 DIAGNOSIS — F1099 Alcohol use, unspecified with unspecified alcohol-induced disorder: Secondary | ICD-10-CM

## 2014-06-18 DIAGNOSIS — F1020 Alcohol dependence, uncomplicated: Secondary | ICD-10-CM | POA: Diagnosis present

## 2014-06-18 DIAGNOSIS — F1994 Other psychoactive substance use, unspecified with psychoactive substance-induced mood disorder: Secondary | ICD-10-CM | POA: Diagnosis present

## 2014-06-18 MED ORDER — HYDROXYZINE HCL 25 MG PO TABS
25.0000 mg | ORAL_TABLET | Freq: Three times a day (TID) | ORAL | Status: DC | PRN
  Filled 2014-06-18: qty 10

## 2014-06-18 MED ORDER — THIAMINE HCL 100 MG/ML IJ SOLN: 100.0000 mg | Freq: Once | INTRAMUSCULAR | Status: DC

## 2014-06-18 MED ORDER — LORAZEPAM 1 MG PO TABS: 1.0000 mg | ORAL_TABLET | Freq: Two times a day (BID) | ORAL | Status: DC

## 2014-06-18 MED ORDER — LORAZEPAM 1 MG PO TABS: 1.0000 mg | ORAL_TABLET | Freq: Every day | ORAL | Status: DC

## 2014-06-18 MED ORDER — ALUM & MAG HYDROXIDE-SIMETH 200-200-20 MG/5ML PO SUSP: 30.0000 mL | ORAL | Status: DC | PRN

## 2014-06-18 MED ORDER — ZOLPIDEM TARTRATE 5 MG PO TABS: 5.0000 mg | ORAL_TABLET | Freq: Every evening | ORAL | Status: DC | PRN

## 2014-06-18 MED ORDER — ZOLPIDEM TARTRATE 10 MG PO TABS
10.0000 mg | ORAL_TABLET | Freq: Every evening | ORAL | Status: DC | PRN
  Administered 2014-06-18: 10 mg via ORAL
  Filled 2014-06-18: qty 1

## 2014-06-18 MED ORDER — IBUPROFEN 600 MG PO TABS
600.0000 mg | ORAL_TABLET | Freq: Three times a day (TID) | ORAL | Status: DC | PRN
  Administered 2014-06-18: 600 mg via ORAL
  Filled 2014-06-18: qty 1

## 2014-06-18 MED ORDER — NICOTINE 21 MG/24HR TD PT24
21.0000 mg | MEDICATED_PATCH | Freq: Every day | TRANSDERMAL | Status: DC
  Administered 2014-06-18 – 2014-06-19 (×2): 21 mg via TRANSDERMAL
  Filled 2014-06-18: qty 1
  Filled 2014-06-18: qty 14
  Filled 2014-06-18 (×3): qty 1

## 2014-06-18 MED ORDER — ONDANSETRON 4 MG PO TBDP: 4.0000 mg | ORAL_TABLET | Freq: Four times a day (QID) | ORAL | Status: DC | PRN

## 2014-06-18 MED ORDER — ADULT MULTIVITAMIN W/MINERALS CH
1.0000 | ORAL_TABLET | Freq: Every day | ORAL | Status: DC
  Administered 2014-06-19: 1 via ORAL
  Filled 2014-06-18 (×2): qty 1
  Filled 2014-06-18: qty 14
  Filled 2014-06-18 (×2): qty 1

## 2014-06-18 MED ORDER — VITAMIN B-1 100 MG PO TABS
100.0000 mg | ORAL_TABLET | Freq: Every day | ORAL | Status: DC
  Administered 2014-06-19: 100 mg via ORAL
  Filled 2014-06-18 (×3): qty 1

## 2014-06-18 MED ORDER — ACETAMINOPHEN 325 MG PO TABS: 650.0000 mg | ORAL_TABLET | ORAL | Status: DC | PRN

## 2014-06-18 MED ORDER — ONDANSETRON HCL 4 MG PO TABS: 4.0000 mg | ORAL_TABLET | Freq: Three times a day (TID) | ORAL | Status: DC | PRN

## 2014-06-18 MED ORDER — LOPERAMIDE HCL 2 MG PO CAPS: 2.0000 mg | ORAL_CAPSULE | ORAL | Status: DC | PRN

## 2014-06-18 MED ORDER — LORAZEPAM 1 MG PO TABS: 1.0000 mg | ORAL_TABLET | Freq: Four times a day (QID) | ORAL | Status: DC | PRN

## 2014-06-18 MED ORDER — LORAZEPAM 1 MG PO TABS
1.0000 mg | ORAL_TABLET | Freq: Four times a day (QID) | ORAL | Status: DC
  Administered 2014-06-18 – 2014-06-19 (×4): 1 mg via ORAL
  Filled 2014-06-18 (×3): qty 1

## 2014-06-18 MED ORDER — LORAZEPAM 1 MG PO TABS: 1.0000 mg | ORAL_TABLET | Freq: Three times a day (TID) | ORAL | Status: DC | PRN

## 2014-06-18 MED ORDER — LORAZEPAM 1 MG PO TABS
1.0000 mg | ORAL_TABLET | Freq: Three times a day (TID) | ORAL | Status: DC
  Filled 2014-06-18: qty 1
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
## 2014-06-18 NOTE — BHH Suicide Risk Assessment (Signed)
[facility] Admission Suicide Risk Assessment   Nursing information obtained from:  Patient Demographic factors:  Male Current Mental Status:  Suicidal ideation indicated by patient Loss Factors:  Legal issues Historical Factors:  Prior suicide attempts Risk Reduction Factors:  Responsible for children under 45 years of age, Employed, Living with another person, especially a relative Total Time spent with patient: 45 minutes Principal Problem: Alcohol use disorder, severe, dependence Diagnosis:   Patient Active Problem List   Diagnosis Date Noted  . Alcohol use disorder, severe, dependence [F10.20] 06/18/2014  . Alcohol dependence with alcohol-induced disorder [F10.29] 05/22/2013  . Major depression [F32.2] 05/22/2013  . Substance induced mood disorder [F19.94] 03/14/2013  . Polysubstance abuse [F19.10] 04/08/2012     Continued Clinical Symptoms:  Alcohol Use Disorder Identification Test Final Score (AUDIT): 20 The "Alcohol Use Disorders Identification Test", Guidelines for Use in Primary Care, Second Edition.  World Science writer [facility]). Score between 0-7:  no or low risk or alcohol related problems. Score between 8-15:  moderate risk of alcohol related problems. Score between 16-19:  high risk of alcohol related problems. Score 20 or above:  warrants further diagnostic evaluation for alcohol dependence and treatment.   CLINICAL FACTORS:   Depression:   Comorbid alcohol abuse/dependence Impulsivity Alcohol/Substance Abuse/Dependencies   Musculoskeletal:  Psychiatric Specialty Exam: Physical Exam  ROS  Blood pressure 127/85, pulse 102, temperature 98 F (36.7 C), temperature source Oral, resp. rate 18, height 5' 9.5" (1.765 m).There is no weight on file to calculate BMI.   COGNITIVE FEATURES THAT CONTRIBUTE TO RISK:  Closed-mindedness, Polarized thinking and Thought constriction (tunnel vision)    SUICIDE RISK:   Moderate:  Frequent suicidal ideation with limited  intensity, and duration, some specificity in terms of plans, no associated intent, good self-control, limited dysphoria/symptomatology, some risk factors present, and identifiable protective factors, including available and accessible social support.  PLAN OF CARE: Supportive approach/coping skills                               Alcohol Dependence/cocaine abuse; detox accordingly work a relapse prevention plan                              Mood disorder reassess the nature of the disorder and determine need for psychotropic agents                               Explore residential treatment options  Medical Decision Making:  Review of Psycho-Social Stressors (1), Review or order clinical lab tests (1), Review of Medication Regimen & Side Effects (2) and Review of New Medication or Change in Dosage (2)  I certify that inpatient services furnished can reasonably be expected to improve the patient's condition.   Franklin [name] A 06/18/2014, 6:25 PM

## 2014-06-18 NOTE — Progress Notes (Signed)
Recreation Therapy Notes  Animal-Assisted Activity (AAA) Program Checklist/Progress Notes Patient Eligibility Criteria Checklist & Daily Group note for Rec Tx Intervention  Date: 06.14.16 Time: 2:30 pm Location: [address] Dayroom   AAA/T Program Assumption of Risk Form signed by Patient/ or Parent Legal Guardian yes  Patient is free of allergies or sever asthma yes  Patient reports no fear of animals yes  Patient reports no history of cruelty to animals yes  Patient understands his/her participation is voluntary yes  Patient washes hands before animal contact yes  Patient washes hands after animal contact yes  Education: Hand Washing, Appropriate Animal Interaction   Education Outcome: Acknowledges understanding/In group clarification offered/Needs additional education.   Clinical Observations/Feedback:  Patient did not attend group.   Jayleah Garbers, LRT/CTRS         Chay Mazzoni A 06/18/2014 3:58 PM 

## 2014-06-18 NOTE — BHH Group Notes (Addendum)
[facility] Group Therapy 06/18/2014 1:15pm   Pt did not attend, sleeping in room; experiencing withdrawal symptoms.   Chad Cordial, LCSWA 06/18/2014 1:25 PM

## 2014-06-18 NOTE — BH Assessment (Signed)
[facility] Assessment Progress Note  Thurman Coyer, RN, [facility] reports that pt has been accepted to [facility], Rm 303-2.  Pt has signed Voluntary Admission and Consent for Treatment, as well as Consent to Release Information, and a notification call has been placed.  Signed forms have been faxed to [facility].  Pt's nurse, Marylu Lund, has been notified, and agrees to send original paperwork along with pt via Juel Burrow, and to call report to [phone].  Doylene Canning, MA Triage Specialist [phone]

## 2014-06-18 NOTE — ED Notes (Addendum)
Pt is asleep with resp regular and nonlabored. He does not appear in any distress at this time. Will continue to monitor closely. Pt does appear comfortable with call bell within reach. 9:30am- Pt stated he still feels suicidal and wants to be admitted to [facility] to get his medications squared away Per social worker pt will be admitted to [facility]. Pt ate 100% of his breakfast . He was given 1mg  of ativan and 25 mg of visteral for his nerves. He requested nicotine patch after he showers. He does contract for safety at this time. Phoned [facility] -pt will be transported to room 303-bed 2. Report given

## 2014-06-18 NOTE — Discharge Instructions (Signed)
Pt will go to room 303-bed 2.

## 2014-06-18 NOTE — Progress Notes (Signed)
Pt admitted voluntary and reports that he wants help with depression, anxiety and drug/alcohol addiction. Pt has had si thoughts to hang himself. He denies thoughts at this current time. Pt sees images that are "floating" and "outlines." Pt reports that he drinks approx 12 beer a day and uses $200 crack cocaine daily. Pt has a prior hx of attempt to hang 5 yrs ago. He lives with his mother part of the time and on the streets. Pt reports that he was in prison for four years in his twenties. He went to college and [employer] school. 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 Pt has a 45 yr old son that lives with his mother. Pt rates depression as a 10 and anxiety as an 8 on 1-10 scale with 10 being the most. Pt oriented to unit.

## 2014-06-19 ENCOUNTER — Encounter (HOSPITAL_COMMUNITY): Payer: Self-pay | Admitting: Registered Nurse

## 2014-06-19 DIAGNOSIS — F1420 Cocaine dependence, uncomplicated: Secondary | ICD-10-CM

## 2014-06-19 DIAGNOSIS — F332 Major depressive disorder, recurrent severe without psychotic features: Secondary | ICD-10-CM | POA: Insufficient documentation

## 2014-06-19 MED ORDER — ZOLPIDEM TARTRATE 10 MG PO TABS: 10.0000 mg | ORAL_TABLET | Freq: Every evening | ORAL | Status: DC | PRN

## 2014-06-19 MED ORDER — ADULT MULTIVITAMIN W/MINERALS CH: 1.0000 | ORAL_TABLET | Freq: Every day | ORAL | Status: DC

## 2014-06-19 MED ORDER — BUPROPION HCL ER (XL) 150 MG PO TB24
150.0000 mg | ORAL_TABLET | Freq: Every day | ORAL | Status: DC
  Administered 2014-06-19: 150 mg via ORAL
  Filled 2014-06-19: qty 1
  Filled 2014-06-19: qty 14
  Filled 2014-06-19 (×3): qty 1

## 2014-06-19 MED ORDER — GABAPENTIN 100 MG PO CAPS
200.0000 mg | ORAL_CAPSULE | Freq: Three times a day (TID) | ORAL | Status: DC
  Administered 2014-06-19: 200 mg via ORAL
  Filled 2014-06-19 (×3): qty 2
  Filled 2014-06-19 (×2): qty 84
  Filled 2014-06-19 (×4): qty 2
  Filled 2014-06-19: qty 84

## 2014-06-19 MED ORDER — BUPROPION HCL ER (XL) 150 MG PO TB24: 150.0000 mg | ORAL_TABLET | Freq: Every day | ORAL | Status: DC

## 2014-06-19 MED ORDER — HYDROXYZINE HCL 25 MG PO TABS: 25.0000 mg | ORAL_TABLET | Freq: Three times a day (TID) | ORAL | Status: DC | PRN

## 2014-06-19 MED ORDER — GABAPENTIN 100 MG PO CAPS: 200.0000 mg | ORAL_CAPSULE | Freq: Three times a day (TID) | ORAL | Status: DC

## 2014-06-19 NOTE — Discharge Summary (Signed)
Physician Discharge Summary Note  Patient:  Franklin [name] is an 44 y.o., male MRN:  [id number] DOB:  [birth year] Patient phone:  [phone] (home)  Patient address:   [address] [address],  Total Time spent with patient: Greater than 30 minutes  Date of Admission:  06/18/2014 Date of Discharge: 06/19/2014  Reason for Admission:  Per H&P Admission:  44 Y/o male who states he stop taking his medications. States he ha started having mood swings, started drinking and using drugs about a year ago. A case a day every 3 days, crack 3 times a week. 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 States that a lot of his episodes are not alcohol or drug induced. States things have gotten to a point he has been thinking about killing himself. The initial assessment is as follows: Franklin [name] is an 44 y.o. male who came to the Emergency Department with thoughts of suicide by hanging self. Pt states that he attempted to hang himself 5 years ago but the branch broke. He states that he has been dealing with depression for years and has been inpatient several times with the last admission being in 2015. He states that he has also been using crack/cocaine and alcohol since he was in his early 20's. He states that he is using almost a case of beer daily and is using around $200 worth of crack every other day. He reports having nausea and anxiety in the ED from withdrawal. Pt states that he has been off his medication for over 6 months but was receiving services from [facility]. He endorses depression and states he has been isolating and sleeping all day lately. When he gets up he goes on crack binges and stays up for days. Pt states that he is hearing voices but "can not tell what they are saying". He states that he is also having some mild visual  hallucinations of "lines of light". Pt is paranoid and asks "why is that nurse staring at me". He states that he is becoming more paranoid and feels that he needs to get back on his medications. Pt reports having a history of being in prison for a few years in his early 20's which was a traumatic experience for him. No childhood abuse noted. He states that his grandfather committed suicide when he was younger and has a family history of substance abuse and mental health issues.  Principal Problem: Alcohol use disorder, severe, dependence Discharge Diagnoses: Patient Active Problem List   Diagnosis Date Noted  . Major depressive disorder, recurrent, severe without psychotic features [F33.2]   . Alcohol use disorder, severe, dependence [F10.20] 06/18/2014  . Alcohol dependence with alcohol-induced disorder [F10.29] 05/22/2013  . Major depression [F32.2] 05/22/2013  . Substance induced mood disorder [F19.94] 03/14/2013  . Polysubstance abuse [F19.10] 04/08/2012    Musculoskeletal: Strength & Muscle Tone: within normal limits Gait & Station: normal Patient leans: N/A  Psychiatric Specialty Exam:  See Suicide Risk Assessment Physical Exam  Nursing note and vitals reviewed. Constitutional: He is oriented to person, place, and time.  Neck: Normal range of motion.  Respiratory: Effort normal.  Musculoskeletal: Normal range of motion.  Neurological: He is oriented to person, place, and time.    Review of Systems  Psychiatric/Behavioral: Negative  for suicidal ideas and hallucinations. Depression: Stable. Nervous/anxious: Stable. Insomnia: Stable.   All other systems reviewed and are negative.   Blood pressure 129/92, pulse 94, temperature 97.8 F (36.6 C), temperature source Oral, resp. rate 18, height 5' 9.5" (1.765 m).There is no weight on file to calculate BMI.  Have you used any form of tobacco in the last 30 days? (Cigarettes, Smokeless Tobacco, Cigars, and/or Pipes): Yes  Has this  patient used any form of tobacco in the last 30 days? (Cigarettes, Smokeless Tobacco, Cigars, and/or Pipes) Yes, A prescription for an FDA-approved tobacco cessation medication was offered at discharge and the patient refused  Past Medical History:  Past Medical History  Diagnosis Date  . Depression   . Anxiety   . Bipolar 1 disorder   . No pertinent past medical history   . Schizophrenia     Past Surgical History  Procedure Laterality Date  . No past surgeries     Family History:  Family History  Problem Relation Age of Onset  . Hypertension Other   . Diabetes Other    Social History:  History  Alcohol Use  . 7.2 oz/week  . 12 Cans of beer per week    Comment: heavy 12 pk/day- as of 12/22/13, nothing in a month     History  Drug Use  . Yes  . Special: "Crack" cocaine    Comment: crack- as of 12/22/13, clean for a month    History   Social History  . Marital Status: Single    Spouse Name: N/A  . Number of Children: N/A  . Years of Education: N/A   Social History Main Topics  . Smoking status: Current Every Day Smoker -- 1.00 packs/day    Types: Cigarettes  . Smokeless tobacco: Never Used  . Alcohol Use: 7.2 oz/week    12 Cans of beer per week     Comment: heavy 12 pk/day- as of 12/22/13, nothing in a month  . Drug Use: Yes    Special: "Crack" cocaine     Comment: crack- as of 12/22/13, clean for a month  . Sexual Activity: Yes   Other Topics Concern  . None   Social History Narrative   Risk to Self: Is patient at risk for suicide?: Yes Risk to Others:   Prior Inpatient Therapy:   Prior Outpatient Therapy:    Level of Care:  OP  Hospital Course:  Dawud Mays was admitted for Alcohol use disorder, severe, dependence and crisis management.  He was treated discharged with the medications listed below under Medication List.  Medical problems were identified and treated as needed.  Home medications were restarted as appropriate.  Improvement was  monitored by observation and Scot Dock daily report of symptom reduction.  Emotional and mental status was monitored by daily self-inventory reports completed by Scot Dock and clinical staff.         Scot Dock was evaluated by the treatment team for stability and plans for continued recovery upon discharge.  Scot Dock motivation was an integral factor for scheduling further treatment.  Employment, transportation, bed availability, health status, family support, and any pending legal issues were also considered during his hospital stay.  He was offered further treatment options upon discharge including but not limited to Residential, Intensive Outpatient, and Outpatient treatment.  Tracker Mance will follow up with the services as listed below under Follow Up Information.     Upon completion of this admission the patient was both mentally  and medically stable for discharge denying suicidal/homicidal ideation, auditory/visual/tactile hallucinations, delusional thoughts and paranoia.      Consults:  psychiatry  Significant Diagnostic Studies:  labs: UDS, ETOH, CBC, CMET  Discharge Vitals:   Blood pressure 129/92, pulse 94, temperature 97.8 F (36.6 C), temperature source Oral, resp. rate 18, height 5' 9.5" (1.765 m). There is no weight on file to calculate BMI. Lab Results:   Results for orders placed or performed during the hospital encounter of 06/17/14 (from the past 72 hour(s))  Acetaminophen level     Status: Abnormal   Collection Time: 06/17/14  1:13 PM  Result Value Ref Range   Acetaminophen (Tylenol), Serum <10 (L) 10 - 30 ug/mL    Comment:        THERAPEUTIC CONCENTRATIONS VARY SIGNIFICANTLY. A RANGE OF 10-30 ug/mL MAY BE AN EFFECTIVE CONCENTRATION FOR MANY PATIENTS. HOWEVER, SOME ARE BEST TREATED AT CONCENTRATIONS OUTSIDE THIS RANGE. ACETAMINOPHEN CONCENTRATIONS >150 ug/mL AT 4 HOURS AFTER INGESTION AND >50 ug/mL AT 12 HOURS AFTER INGESTION ARE OFTEN ASSOCIATED  WITH TOXIC REACTIONS.   CBC     Status: None   Collection Time: 06/17/14  1:13 PM  Result Value Ref Range   WBC 6.9 4.0 - 10.5 K/uL   RBC 5.23 4.22 - 5.81 MIL/uL   Hemoglobin 16.0 13.0 - 17.0 g/dL   HCT 46.6 39.0 - 52.0 %   MCV 89.1 78.0 - 100.0 fL   MCH 30.6 26.0 - 34.0 pg   MCHC 34.3 30.0 - 36.0 g/dL   RDW 14.0 11.5 - 15.5 %   Platelets 333 150 - 400 K/uL  Comprehensive metabolic panel     Status: Abnormal   Collection Time: 06/17/14  1:13 PM  Result Value Ref Range   Sodium 136 135 - 145 mmol/L   Potassium 3.6 3.5 - 5.1 mmol/L   Chloride 102 101 - 111 mmol/L   CO2 26 22 - 32 mmol/L   Glucose, Bld 181 (H) 65 - 99 mg/dL   BUN 10 6 - 20 mg/dL   Creatinine, Ser 0.91 0.61 - 1.24 mg/dL   Calcium 9.4 8.9 - 10.3 mg/dL   Total Protein 8.1 6.5 - 8.1 g/dL   Albumin 4.6 3.5 - 5.0 g/dL   AST 25 15 - 41 U/L   ALT 29 17 - 63 U/L   Alkaline Phosphatase 60 38 - 126 U/L   Total Bilirubin 1.8 (H) 0.3 - 1.2 mg/dL   GFR calc non Af Amer >60 >60 mL/min   GFR calc Af Amer >60 >60 mL/min    Comment: (NOTE) The eGFR has been calculated using the CKD EPI equation. This calculation has not been validated in all clinical situations. eGFR's persistently <60 mL/min signify possible Chronic Kidney Disease.    Anion gap 8 5 - 15  Ethanol (ETOH)     Status: None   Collection Time: 06/17/14  1:13 PM  Result Value Ref Range   Alcohol, Ethyl (B) <5 <5 mg/dL    Comment:        LOWEST DETECTABLE LIMIT FOR SERUM ALCOHOL IS 5 mg/dL FOR MEDICAL PURPOSES ONLY   Salicylate level     Status: None   Collection Time: 06/17/14  1:13 PM  Result Value Ref Range   Salicylate Lvl <9.2 2.8 - 30.0 mg/dL  Urine rapid drug screen (hosp performed)not at [facility]     Status: Abnormal   Collection Time: 06/17/14  1:57 PM  Result Value Ref Range   Opiates NONE DETECTED  NONE DETECTED   Cocaine POSITIVE (A) NONE DETECTED   Benzodiazepines NONE DETECTED NONE DETECTED   Amphetamines NONE DETECTED NONE DETECTED    Tetrahydrocannabinol POSITIVE (A) NONE DETECTED   Barbiturates NONE DETECTED NONE DETECTED    Comment:        DRUG SCREEN FOR MEDICAL PURPOSES ONLY.  IF CONFIRMATION IS NEEDED FOR ANY PURPOSE, NOTIFY LAB WITHIN 5 DAYS.        LOWEST DETECTABLE LIMITS FOR URINE DRUG SCREEN Drug Class       Cutoff (ng/mL) Amphetamine      1000 Barbiturate      200 Benzodiazepine   259 Tricyclics       563 Opiates          300 Cocaine          300 THC              50     Physical Findings: AIMS: Facial and Oral Movements Muscles of Facial Expression: None, normal Lips and Perioral Area: None, normal Jaw: None, normal Tongue: None, normal,Extremity Movements Upper (arms, wrists, hands, fingers): None, normal Lower (legs, knees, ankles, toes): None, normal, Trunk Movements Neck, shoulders, hips: None, normal, Overall Severity Severity of abnormal movements (highest score from questions above): None, normal Incapacitation due to abnormal movements: None, normal Patient's awareness of abnormal movements (rate only patient's report): No Awareness,    CIWA:  CIWA-Ar Total: 6 COWS:      See Psychiatric Specialty Exam and Suicide Risk Assessment completed by Attending Physician prior to discharge.  Discharge destination:  Home  Is patient on multiple antipsychotic therapies at discharge:  No   Has Patient had three or more failed trials of antipsychotic monotherapy by history:  No    Recommended Plan for Multiple Antipsychotic Therapies: NA      Discharge Instructions    Activity as tolerated - No restrictions    Complete by:  As directed      Diet - low sodium heart healthy    Complete by:  As directed      Discharge instructions    Complete by:  As directed   Take all of you medications as prescribed by your mental healthcare provider.  Report any adverse effects and reactions from your medications to your outpatient provider promptly. Do not engage in alcohol and or illegal drug use  while on prescription medicines. In the event of worsening symptoms call the crisis hotline, 911, and or go to the nearest emergency department for appropriate evaluation and treatment of symptoms. Follow-up with your primary care provider for your medical issues, concerns and or health care needs.   Keep all scheduled appointments.  If you are unable to keep an appointment call to reschedule.  Let the nurse know if you will need medications before next scheduled appointment.            Medication List    TAKE these medications      Indication   buPROPion 150 MG 24 hr tablet  Commonly known as:  WELLBUTRIN XL  Take 1 tablet (150 mg total) by mouth daily.   Indication:  Major Depressive Disorder     gabapentin 100 MG capsule  Commonly known as:  NEURONTIN  Take 2 capsules (200 mg total) by mouth 3 (three) times daily.   Indication:  Neuropathic Pain, neuropathic pain     hydrOXYzine 25 MG tablet  Commonly known as:  ATARAX/VISTARIL  Take 1 tablet (25 mg total) by mouth  3 (three) times daily as needed for anxiety.   Indication:  anxiety     multivitamin with minerals Tabs tablet  Take 1 tablet by mouth daily.   Indication:  Nutritional Support     zolpidem 10 MG tablet  Commonly known as:  AMBIEN  Take 1 tablet (10 mg total) by mouth at bedtime as needed for sleep.   Indication:  Trouble Sleeping       Follow-up Information    Follow up with [facility].   Specialty:  Behavioral Health   Why:  You may walk-in between 8am-3pm Monday-Friday to be seen by a doctor and a therapist.    Contact information:   [place] [address] [phone]       Follow-up recommendations:  Activity:  As tolerated Diet:  As tolerated  Comments:   Patient has been instructed to take medications as prescribed; and report adverse effects to outpatient provider.  Follow up with primary doctor for any medical issues and If symptoms recur report to nearest emergency or crisis hot  line.    Total Discharge Time: Greater than 30 minutes  Signed: Earleen Newport, FNP-BC 06/19/2014, 4:24 PM  I personally assessed the patient and formulated the plan Geralyn Flash A. Sabra Heck, M.D.

## 2014-06-19 NOTE — BHH Suicide Risk Assessment (Signed)
[facility] Discharge Suicide Risk Assessment   Demographic Factors:  Male  Total Time spent with patient: 30 minutes  Musculoskeletal: Strength & Muscle Tone: within normal limits Gait & Station: normal Patient leans: N/A  Psychiatric Specialty Exam: Physical Exam  Review of Systems  Constitutional: Negative.   HENT: Negative.   Eyes: Negative.   Respiratory: Negative.   Cardiovascular: Negative.   Gastrointestinal: Negative.   Genitourinary: Negative.   Musculoskeletal: Negative.   Skin: Negative.   Endo/Heme/Allergies: Negative.   Psychiatric/Behavioral: Positive for depression and substance abuse. The patient is nervous/anxious.     Blood pressure 129/92, pulse 94, temperature 97.8 F (36.6 C), temperature source Oral, resp. rate 18, height 5' 9.5" (1.765 m).There is no weight on file to calculate BMI.  General Appearance: Fairly Groomed  Patent attorney::  Fair  Speech:  Clear and Coherent409  Volume:  Normal  Mood:  Euthymic  Affect:  Appropriate  Thought Process:  Coherent and Goal Directed  Orientation:  Full (Time, Place, and Person)  Thought Content:  plans as he moves on relapse prevention plan  Suicidal Thoughts:  No  Homicidal Thoughts:  No  Memory:  Immediate;   Fair Recent;   Fair Remote;   Fair  Judgement:  Fair  Insight:  Present  Psychomotor Activity:  Restlessness  Concentration:  Fair  Recall:  [employer] of Knowledge:Fair  Language: Fair  Akathisia:  No  Handed:  Right  AIMS (if indicated):     Assets:  Desire for Improvement Social Support  Sleep:     Cognition: WNL  ADL's:  Intact   Have you used any form of tobacco in the last 30 days? (Cigarettes, Smokeless Tobacco, Cigars, and/or Pipes): Yes  Has this patient used any form of tobacco in the last 30 days? (Cigarettes, Smokeless Tobacco, Cigars, and/or Pipes) Yes, Prescription not provided because: will be given nicotine patches  Mental Status Per Nursing Assessment::   On Admission:   Suicidal ideation indicated by patient  Current Mental Status by Physician: In full contact with reality. There are no active S/S of withdrawal. There are no active SI plans or intent. He states he was wanting to be back on his medications and have the follow up appointments. He wants to be D/C today. Her sister is going to pick him up. He states he wants to initiate the disability application. States that due to how unpredictable his mood is he has not been able to keep a job.    Loss Factors: Financial problems/change in socioeconomic status  Historical Factors: Impulsivity  Risk Reduction Factors:   Sense of responsibility to family and Positive social support  Continued Clinical Symptoms:  Depression:   Comorbid alcohol abuse/dependence Alcohol/Substance Abuse/Dependencies  Cognitive Features That Contribute To Risk:  Closed-mindedness, Polarized thinking and Thought constriction (tunnel vision)    Suicide Risk:  Minimal: No identifiable suicidal ideation.  Patients presenting with no risk factors but with morbid ruminations; may be classified as minimal risk based on the severity of the depressive symptoms  Principal Problem: Alcohol use disorder, severe, dependence Discharge Diagnoses:  Patient Active Problem List   Diagnosis Date Noted  . Alcohol use disorder, severe, dependence [F10.20] 06/18/2014  . Alcohol dependence with alcohol-induced disorder [F10.29] 05/22/2013  . Major depression [F32.2] 05/22/2013  . Substance induced mood disorder [F19.94] 03/14/2013  . Polysubstance abuse [F19.10] 04/08/2012    Follow-up Information    Follow up with [facility]   Specialty:  Behavioral Health   Why:  You may walk-in between 8am-3pm Monday-Friday to be seen by a doctor and a therapist.    Contact information:   [address] [place] [address] [phone]       Plan Of Care/Follow-up recommendations:  Activity:  as tolerated Diet:  regular Follow up [facility] as  above/AA Is patient on multiple antipsychotic therapies at discharge:  No   Has Patient had three or more failed trials of antipsychotic monotherapy by history:  No  Recommended Plan for Multiple Antipsychotic Therapies: NA    Mariadelaluz Guggenheim A 06/19/2014, 12:54 PM

## 2014-06-19 NOTE — Progress Notes (Signed)
Pt discharged within 24 hours of admission; PSA not completed.  Chad Cordial, Theresia Majors [phone]

## 2014-06-19 NOTE — BHH Group Notes (Signed)
[facility] LCSW Aftercare Discharge Planning Group Note  06/19/2014 8:45 AM  Participation Quality: Alert, Appropriate and Oriented  Mood/Affect: Appropriate  Depression Rating: 7  Anxiety Rating: 8  Thoughts of Suicide: Pt denies SI/HI  Will you contract for safety? Yes  Current AVH: Pt denies  Plan for Discharge/Comments: Pt attended discharge planning group and actively participated in group. CSW discussed suicide prevention education with the group and encouraged them to discuss discharge planning and any relevant barriers. Pt stayed briefly in group, requesting disability application and housing; when informed that disability is completed through social services and the social security administration, Pt stated, "I've come to the wrong place." Pt then left group.  Transportation Means: Pt reports access to transportation  Supports: No supports mentioned at this time  Chad Cordial, Theresia Majors 06/19/2014 9:39 AM

## 2014-06-19 NOTE — BHH Suicide Risk Assessment (Signed)
[facility] INPATIENT:  Family/Significant Other Suicide Prevention Education  Suicide Prevention Education:  Patient Refusal for Family/Significant Other Suicide Prevention Education: The patient Franklin [name] has refused to provide written consent for family/significant other to be provided Family/Significant Other Suicide Prevention Education during admission and/or prior to discharge.  Physician notified.  Elaina Hoops 06/19/2014, 2:40 PM

## 2014-06-19 NOTE — Progress Notes (Signed)
  [facility] Adult Case Management Discharge Plan :  Will you be returning to the same living situation after discharge:  Yes,  will return to mother's house At discharge, do you have transportation home?: Yes,  Pt provided with bus pass Do you have the ability to pay for your medications: Yes,  Pt provided with medication supply and prescriptions  Release of information consent forms completed and in the chart;  Patient's signature needed at discharge.  Patient to Follow up at: Follow-up Information    Follow up with [facility].   Specialty:  Behavioral Health   Why:  You may walk-in between 8am-3pm Monday-Friday to be seen by a doctor and a therapist.    Contact information:   [address] [place] [address] [phone]       Patient denies SI/HI: Yes,  Pt denies    Safety Planning and Suicide Prevention discussed: Yes,  with Pt. Declined family contact  Have you used any form of tobacco in the last 30 days? (Cigarettes, Smokeless Tobacco, Cigars, and/or Pipes): Yes  Has patient been referred to the Quitline?: Patient refused referral  Elaina Hoops 06/19/2014, 2:41 PM

## 2014-06-19 NOTE — Progress Notes (Signed)
Pt was asked at the beginning of group if he would be attending the NA meeting, pt stated he wasn't going and remained in his room asleep through the duration of group.   Payslie Mccaig, MHT  

## 2014-06-19 NOTE — Progress Notes (Signed)
D. Pt had been in bed much of the evening, did not attend evening group activity. Pt reported feeling tired and depressed and needing to get some rest tonight and also complained of anxiety. Pt did mention about wanting to get back on his medications and did receive evening medications without incident. A. Support and encouragement provided. R. Safety maintained, will continue to monitor.

## 2014-06-19 NOTE — Progress Notes (Signed)
Pt is D/C home. Pt denies SI/HI/AV. Pt follow-up and medications were reviewed and pt verbalized understanding. Pt pleasant and cooperative. Pt belongings were returned.   

## 2014-06-19 NOTE — Tx Team (Signed)
Initial Interdisciplinary Treatment Plan   PATIENT STRESSORS: Medication change or noncompliance Substance abuse   PATIENT STRENGTHS: Ability for insight Average or above average intelligence Capable of independent living General fund of knowledge Motivation for treatment/growth   PROBLEM LIST: Problem List/Patient Goals Date to be addressed Date deferred Reason deferred Estimated date of resolution  "been depressed" 06/18/14     Substance Abuse 06/18/14     Suicidal Ideation 06/18/14                                          DISCHARGE CRITERIA:  Ability to meet basic life and health needs Improved stabilization in mood, thinking, and/or behavior Verbal commitment to aftercare and medication compliance Withdrawal symptoms are absent or subacute and managed without 24-hour nursing intervention  PRELIMINARY DISCHARGE PLAN: Attend aftercare/continuing care group  PATIENT/FAMIILY INVOLVEMENT: This treatment plan has been presented to and reviewed with the patient, Franklin [name], and/or family member, .  The patient and family have been given the opportunity to ask questions and make suggestions.  Daryl Beehler, [place] 06/19/2014, 3:12 AM

## 2014-06-19 NOTE — Progress Notes (Signed)
Recreation Therapy Notes  Date: 06.15.16 Time: 9:30 am Location: 300 Hall Group Room  Group Topic: Stress Management  Goal Area(s) Addresses:  Patient will verbalize importance of using healthy stress management.  Patient will identify positive emotions associated with healthy stress management.   Intervention: Stress Management  Activity :  Guided Imagery.  LRT introduced and educated patients on stress management technique of guided imagery.  A script was used to deliver the technique to patients.  Patients were asked to follow the script read aloud by LRT to engage in practicing the technique.  Education:  Stress Management, Discharge Planning.   Clinical Observations/Feedback: Patient did not attend group.  Caroll Rancher, LRT/CTRS         Lillia Abed, Malayja Freund A 06/19/2014 2:16 PM

## 2014-08-11 ENCOUNTER — Encounter (HOSPITAL_COMMUNITY): Payer: Self-pay

## 2014-08-11 ENCOUNTER — Emergency Department (HOSPITAL_COMMUNITY)
Admission: EM | Admit: 2014-08-11 | Discharge: 2014-08-11 | Disposition: A | Discharge status: Home / Self Care | Payer: Self-pay | Attending: Emergency Medicine | Admitting: Emergency Medicine

## 2014-08-11 DIAGNOSIS — Z79899 Other long term (current) drug therapy: Secondary | ICD-10-CM | POA: Insufficient documentation

## 2014-08-11 DIAGNOSIS — Z720 Tobacco use: Secondary | ICD-10-CM | POA: Insufficient documentation

## 2014-08-11 DIAGNOSIS — F1210 Cannabis abuse, uncomplicated: Secondary | ICD-10-CM | POA: Insufficient documentation

## 2014-08-11 DIAGNOSIS — F32A Depression, unspecified: Secondary | ICD-10-CM

## 2014-08-11 DIAGNOSIS — F419 Anxiety disorder, unspecified: Secondary | ICD-10-CM | POA: Insufficient documentation

## 2014-08-11 DIAGNOSIS — F1910 Other psychoactive substance abuse, uncomplicated: Secondary | ICD-10-CM

## 2014-08-11 DIAGNOSIS — F1410 Cocaine abuse, uncomplicated: Secondary | ICD-10-CM | POA: Insufficient documentation

## 2014-08-11 DIAGNOSIS — F209 Schizophrenia, unspecified: Secondary | ICD-10-CM | POA: Insufficient documentation

## 2014-08-11 DIAGNOSIS — F329 Major depressive disorder, single episode, unspecified: Secondary | ICD-10-CM | POA: Insufficient documentation

## 2014-08-11 LAB — COMPREHENSIVE METABOLIC PANEL
ALT: 32 U/L (ref 17–63)
AST: 30 U/L (ref 15–41)
Albumin: 4.8 g/dL (ref 3.5–5.0)
Alkaline Phosphatase: 53 U/L (ref 38–126)
Anion gap: 11 (ref 5–15)
BUN: 14 mg/dL (ref 6–20)
CO2: 23 mmol/L (ref 22–32)
Calcium: 9.2 mg/dL (ref 8.9–10.3)
Chloride: 102 mmol/L (ref 101–111)
Creatinine, Ser: 0.98 mg/dL (ref 0.61–1.24)
GFR calc Af Amer: >60 mL/min (ref >60)
GFR calc non Af Amer: >60 mL/min (ref >60)
Glucose, Bld: 73 mg/dL (ref 65–99)
Potassium: 4.2 mmol/L (ref 3.5–5.1)
Sodium: 136 mmol/L (ref 135–145)
Total Bilirubin: 1.6 mg/dL — ABNORMAL HIGH (ref 0.3–1.2)
Total Protein: 8.2 g/dL — ABNORMAL HIGH (ref 6.5–8.1)

## 2014-08-11 LAB — CBC
HCT: 44.8 % (ref 39.0–52.0)
Hemoglobin: 15.5 g/dL (ref 13.0–17.0)
MCH: 30.8 pg (ref 26.0–34.0)
MCHC: 34.6 g/dL (ref 30.0–36.0)
MCV: 88.9 fL (ref 78.0–100.0)
Platelets: 317 10*3/uL (ref 150–400)
RBC: 5.04 MIL/uL (ref 4.22–5.81)
RDW: 14.1 % (ref 11.5–15.5)
WBC: 7.8 10*3/uL (ref 4.0–10.5)

## 2014-08-11 LAB — RAPID URINE DRUG SCREEN, HOSP PERFORMED
Amphetamines: NOT DETECTED
Barbiturates: NOT DETECTED
Benzodiazepines: NOT DETECTED
Cocaine: POSITIVE — AB
Opiates: NOT DETECTED
Tetrahydrocannabinol: POSITIVE — AB

## 2014-08-11 LAB — ETHANOL: Alcohol, Ethyl (B): <5 mg/dL (ref <5)

## 2014-08-11 MED ORDER — ZOLPIDEM TARTRATE 5 MG PO TABS: 5.0000 mg | ORAL_TABLET | Freq: Every evening | ORAL | Status: DC | PRN

## 2014-08-11 MED ORDER — IBUPROFEN 200 MG PO TABS: 600.0000 mg | ORAL_TABLET | Freq: Three times a day (TID) | ORAL | Status: DC | PRN

## 2014-08-11 MED ORDER — ONDANSETRON HCL 4 MG PO TABS: 4.0000 mg | ORAL_TABLET | Freq: Three times a day (TID) | ORAL | Status: DC | PRN

## 2014-08-11 MED ORDER — ACETAMINOPHEN 325 MG PO TABS: 650.0000 mg | ORAL_TABLET | ORAL | Status: DC | PRN

## 2014-08-11 NOTE — ED Notes (Signed)
Patient belongings are jeans, shoes, socks, red shirt. Items are in 1 patient belonging bag. Patient has been wanded by security.

## 2014-08-11 NOTE — ED Notes (Addendum)
Pt stated he went back to [place] to live with his mother. He stated he tool all the samples we gave him and then ran out of his medication. Pt has been on a drinking binge times one week and did cocaine at 5am today. He does appear a little anxious and nervous. Pt does contract for safety and denies Si and HI. Report to [place] Pt for discharge.
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
## 2014-08-11 NOTE — ED Notes (Signed)
Pt is awake and alert, denies SI/HI.  family at bedside. Pt is requesting to speak with Social worker regarding discharge. AVS printed. Escorted to lobby without incident. Safety monitored and maintained. tlewis RN

## 2014-08-11 NOTE — ED Provider Notes (Signed)
CSN: [id number]     Arrival date & time 08/11/14  0902 History   First MD Initiated Contact with Patient 08/11/14 1005     Chief Complaint  Patient presents with  . Hallucinations  . Polysubstance Abuse      (Consider location/radiation/quality/duration/timing/severity/associated sxs/prior Treatment) HPI Franklin [name] is a 44 y.o. male With history of depression, schizophrenia, bipolar disorder, presents to emergency department complaining of hearing voices, worsening depression, polysubstance abuse. Patient was seen a month ago for the same. States he spent a few days at behavioral health. Since then he has not been able to fill his prescriptions so he has not been taking his medications. He states he relapsed a week ago and started drinking and using cocaine again. He states his voices and depression is worsening. He is requesting long-term treatment. He last used cocaine and alcohol at 5 AM this morning. Patient denies suicidal or homicidal ideations. He denies any medical problems or medical complaints.  Past Medical History  Diagnosis Date  . Depression   . Anxiety   . Bipolar 1 disorder   . No pertinent past medical history   . Schizophrenia    Past Surgical History  Procedure Laterality Date  . No past surgeries     Family History  Problem Relation Age of Onset  . Hypertension Other   . Diabetes Other    History  Substance Use Topics  . Smoking status: Current Every Day Smoker -- 1.00 packs/day    Types: Cigarettes  . Smokeless tobacco: Never Used  . Alcohol Use: 7.2 oz/week    12 Cans of beer per week     Comment: heavy 12 pk/day- as of 12/22/13    Review of Systems  Constitutional: Negative for fever and chills.  Respiratory: Negative for cough, chest tightness and shortness of breath.   Cardiovascular: Negative for chest pain, palpitations and leg swelling.  Musculoskeletal: Negative for myalgias, arthralgias, neck pain and neck stiffness.  Skin: Negative for  rash.  Allergic/Immunologic: Negative for immunocompromised state.  Neurological: Negative for dizziness, weakness, light-headedness, numbness and headaches.  Psychiatric/Behavioral: Positive for hallucinations. Negative for suicidal ideas, confusion and self-injury. The patient is nervous/anxious.   All other systems reviewed and are negative.     Allergies  Review of patient's allergies indicates no known allergies.  Home Medications   Prior to Admission medications   Medication Sig Start Date End Date Taking? Authorizing Provider  buPROPion (WELLBUTRIN XL) 150 MG 24 hr tablet Take 1 tablet (150 mg total) by mouth daily. 06/19/14  Yes Shuvon B Rankin, NP  gabapentin (NEURONTIN) 100 MG capsule Take 2 capsules (200 mg total) by mouth 3 (three) times daily. 06/19/14  Yes Shuvon B Rankin, NP  hydrOXYzine (ATARAX/VISTARIL) 25 MG tablet Take 1 tablet (25 mg total) by mouth 3 (three) times daily as needed for anxiety. 06/19/14  Yes Shuvon B Rankin, NP  Multiple Vitamin (MULTIVITAMIN WITH MINERALS) TABS tablet Take 1 tablet by mouth daily. 06/19/14  Yes Shuvon B Rankin, NP  zolpidem (AMBIEN) 10 MG tablet Take 1 tablet (10 mg total) by mouth at bedtime as needed for sleep. 06/19/14 07/19/14  Shuvon B Rankin, NP   BP 151/85 mmHg  Pulse 80  Temp(Src) 97.5 F (36.4 C) (Oral)  Resp 18  SpO2 100% Physical Exam  Constitutional: He appears well-developed and well-nourished. No distress.  HENT:  Head: Normocephalic and atraumatic.  Eyes: Conjunctivae are normal.  Neck: Neck supple.  Cardiovascular: Normal rate, regular rhythm and normal  heart sounds.   Pulmonary/Chest: Effort normal. No respiratory distress. He has no wheezes. He has no rales.  Musculoskeletal: He exhibits no edema.  Neurological: He is alert.  Skin: Skin is warm and dry.  Psychiatric:  Calm, cooperative  Nursing note and vitals reviewed.   ED Course  Procedures (including critical care time) Labs Review Labs Reviewed   COMPREHENSIVE METABOLIC PANEL - Abnormal; Notable for the following:    Total Protein 8.2 (*)    Total Bilirubin 1.6 (*)    All other components within normal limits  URINE RAPID DRUG SCREEN, HOSP PERFORMED - Abnormal; Notable for the following:    Cocaine POSITIVE (*)    Tetrahydrocannabinol POSITIVE (*)    All other components within normal limits  ETHANOL  CBC    Imaging Review No results found.   EKG Interpretation None      MDM   Final diagnoses:  Depression  Polysubstance abuse    Patient with history of schizophrenia presents with hallucinations and relapsed on cocaine and alcohol. Will get labs for medical clearance. Patient's vital signs are normal other than mild hypertension. He is in no distress, calm and cooperative.He denies SI or HI.  12:10 PM Pt medically cleared. Waiting for TTS assessment.   Filed Vitals:   08/11/14 0915  BP: 151/85  Pulse: 80  Temp: 97.5 F (36.4 C)  Resp: 18     Patient was evaluated by TTS. He does not meet admission criteria.He was cleared for discharge, he was given outpatient resources for follow-up. We'll discharge him home.  Filed Vitals:   08/11/14 0915 08/11/14 1343 08/11/14 1802 08/11/14 1934  BP: 151/85 127/77 128/71 153/73  Pulse: 80 76 66 73  Temp: 97.5 F (36.4 C) 97.6 F (36.4 C) 98.6 F (37 C) 98.5 F (36.9 C)  TempSrc: Oral Oral Oral Oral  Resp: SpO2: 100%  99% 100%     Jaynie Crumble, PA-C 08/11/14 1948  Vanetta Mulders, MD 08/15/14 [phone]

## 2014-08-11 NOTE — ED Notes (Signed)
Pt has been changed into scrubs and wanded by security.  

## 2014-08-11 NOTE — ED Notes (Signed)
Pt c/o auditory hallucinations x 2 weeks and ETOH and cocaine abuse x 1 week.  Pt reports that he is a newly diagnosed schizophrenic and recently spent several days at [facility].  Sts "the voices just wont stop talking."  Pt sts he lives in [place] and was unable to get to the pharmacy to get his medications filled.  Last substance use, 1 case of beer and $50 worth of cocaine, around 0500.  Denies SI/HI.

## 2014-08-11 NOTE — Discharge Instructions (Signed)
Please follow up with resources provided. Avoid using illicit substances.    Polysubstance Abuse When people abuse more than one drug or type of drug it is called polysubstance or polydrug abuse. For example, many smokers also drink alcohol. This is one form of polydrug abuse. Polydrug abuse also refers to the use of a drug to counteract an unpleasant effect produced by another drug. It may also be used to help with withdrawal from another drug. People who take stimulants may become agitated. Sometimes this agitation is countered with a tranquilizer. This helps protect against the unpleasant side effects. Polydrug abuse also refers to the use of different drugs at the same time.  Anytime drug use is interfering with normal living activities, it has become abuse. This includes problems with family and friends. Psychological dependence has developed when your mind tells you that the drug is needed. This is usually followed by physical dependence which has developed when continuing increases of drug are required to get the same feeling or "high". This is known as addiction or chemical dependency. A person's risk is much higher if there is a history of chemical dependency in the family. SIGNS OF CHEMICAL DEPENDENCY  You have been told by friends or family that drugs have become a problem.  You fight when using drugs.  You are having blackouts (not remembering what you do while using).  You feel sick from using drugs but continue using.  You lie about use or amounts of drugs (chemicals) used.  You need chemicals to get you going.  You are suffering in work performance or in school because of drug use.  You get sick from use of drugs but continue to use anyway.  You need drugs to relate to people or feel comfortable in social situations.  You use drugs to forget problems. "Yes" answered to any of the above signs of chemical dependency indicates there are problems. The longer the use of drugs  continues, the greater the problems will become. If there is a family history of drug or alcohol use, it is best not to experiment with these drugs. Continual use leads to tolerance. After tolerance develops more of the drug is needed to get the same feeling. This is followed by addiction. With addiction, drugs become the most important part of life. It becomes more important to take drugs than participate in the other usual activities of life. This includes relating to friends and family. Addiction is followed by dependency. Dependency is a condition where drugs are now needed not just to get high, but to feel normal. Addiction cannot be cured but it can be stopped. This often requires outside help and the care of professionals. Treatment centers are listed in the yellow pages under: Cocaine, Narcotics, and Alcoholics Anonymous. Most hospitals and clinics can refer you to a specialized care center. Talk to your caregiver if you need help. Document Released: 08/12/2004 Document Revised: 03/15/2011 Document Reviewed: 12/21/2004 [facility] Patient Information 2015 [place], [place]. This information is not intended to replace advice given to you by your health care provider. Make sure you discuss any questions you have with your health care provider.

## 2014-10-01 ENCOUNTER — Encounter (HOSPITAL_COMMUNITY): Payer: Self-pay | Admitting: Behavioral Health

## 2014-10-01 ENCOUNTER — Observation Stay (HOSPITAL_COMMUNITY)
Admission: AD | Admit: 2014-10-01 | Discharge: 2014-10-02 | Disposition: A | Discharge status: Home / Self Care | Payer: Federal, State, Local not specified - Other | Source: Intra-hospital | Attending: Psychiatry | Admitting: Psychiatry

## 2014-10-01 ENCOUNTER — Encounter (HOSPITAL_COMMUNITY): Payer: Self-pay | Admitting: Emergency Medicine

## 2014-10-01 ENCOUNTER — Emergency Department (HOSPITAL_COMMUNITY)
Admission: EM | Admit: 2014-10-01 | Discharge: 2014-10-01 | Disposition: A | Discharge status: Other Acute Inpatient Hospital | Payer: Self-pay | Attending: Emergency Medicine | Admitting: Emergency Medicine

## 2014-10-01 DIAGNOSIS — F1414 Cocaine abuse with cocaine-induced mood disorder: Principal | ICD-10-CM | POA: Insufficient documentation

## 2014-10-01 DIAGNOSIS — F1420 Cocaine dependence, uncomplicated: Secondary | ICD-10-CM | POA: Diagnosis present

## 2014-10-01 DIAGNOSIS — F1410 Cocaine abuse, uncomplicated: Secondary | ICD-10-CM

## 2014-10-01 DIAGNOSIS — F1994 Other psychoactive substance use, unspecified with psychoactive substance-induced mood disorder: Secondary | ICD-10-CM | POA: Diagnosis present

## 2014-10-01 DIAGNOSIS — F419 Anxiety disorder, unspecified: Secondary | ICD-10-CM | POA: Insufficient documentation

## 2014-10-01 DIAGNOSIS — F332 Major depressive disorder, recurrent severe without psychotic features: Secondary | ICD-10-CM | POA: Insufficient documentation

## 2014-10-01 DIAGNOSIS — F250 Schizoaffective disorder, bipolar type: Secondary | ICD-10-CM | POA: Insufficient documentation

## 2014-10-01 DIAGNOSIS — F329 Major depressive disorder, single episode, unspecified: Secondary | ICD-10-CM | POA: Insufficient documentation

## 2014-10-01 DIAGNOSIS — F17210 Nicotine dependence, cigarettes, uncomplicated: Secondary | ICD-10-CM | POA: Insufficient documentation

## 2014-10-01 DIAGNOSIS — F32A Depression, unspecified: Secondary | ICD-10-CM

## 2014-10-01 DIAGNOSIS — Z720 Tobacco use: Secondary | ICD-10-CM | POA: Insufficient documentation

## 2014-10-01 DIAGNOSIS — F1020 Alcohol dependence, uncomplicated: Secondary | ICD-10-CM | POA: Insufficient documentation

## 2014-10-01 LAB — ETHANOL: Alcohol, Ethyl (B): <5 mg/dL (ref <5)

## 2014-10-01 LAB — COMPREHENSIVE METABOLIC PANEL
ALT: 21 U/L (ref 17–63)
AST: 22 U/L (ref 15–41)
Albumin: 4.7 g/dL (ref 3.5–5.0)
Alkaline Phosphatase: 54 U/L (ref 38–126)
Anion gap: 11 (ref 5–15)
BUN: 16 mg/dL (ref 6–20)
CO2: 25 mmol/L (ref 22–32)
Calcium: 9.5 mg/dL (ref 8.9–10.3)
Chloride: 99 mmol/L — ABNORMAL LOW (ref 101–111)
Creatinine, Ser: 1.2 mg/dL (ref 0.61–1.24)
GFR calc Af Amer: >60 mL/min (ref >60)
GFR calc non Af Amer: >60 mL/min (ref >60)
Glucose, Bld: 98 mg/dL (ref 65–99)
Potassium: 3.7 mmol/L (ref 3.5–5.1)
Sodium: 135 mmol/L (ref 135–145)
Total Bilirubin: 1.4 mg/dL — ABNORMAL HIGH (ref 0.3–1.2)
Total Protein: 7.8 g/dL (ref 6.5–8.1)

## 2014-10-01 LAB — RAPID URINE DRUG SCREEN, HOSP PERFORMED
Amphetamines: NOT DETECTED
Barbiturates: NOT DETECTED
Benzodiazepines: NOT DETECTED
Cocaine: POSITIVE — AB
Opiates: NOT DETECTED
Tetrahydrocannabinol: NOT DETECTED

## 2014-10-01 LAB — CBC
HCT: 44.3 % (ref 39.0–52.0)
Hemoglobin: 15.2 g/dL (ref 13.0–17.0)
MCH: 30.3 pg (ref 26.0–34.0)
MCHC: 34.3 g/dL (ref 30.0–36.0)
MCV: 88.4 fL (ref 78.0–100.0)
Platelets: 295 10*3/uL (ref 150–400)
RBC: 5.01 MIL/uL (ref 4.22–5.81)
RDW: 14.1 % (ref 11.5–15.5)
WBC: 9.4 10*3/uL (ref 4.0–10.5)

## 2014-10-01 LAB — ACETAMINOPHEN LEVEL: Acetaminophen (Tylenol), Serum: <10 ug/mL — ABNORMAL LOW (ref 10–30)

## 2014-10-01 LAB — SALICYLATE LEVEL: Salicylate Lvl: <4 mg/dL (ref 2.8–30.0)

## 2014-10-01 MED ORDER — LOPERAMIDE HCL 2 MG PO CAPS
4.0000 mg | ORAL_CAPSULE | Freq: Once | ORAL | Status: AC
  Administered 2014-10-01: 4 mg via ORAL
  Filled 2014-10-01: qty 2

## 2014-10-01 MED ORDER — IBUPROFEN 200 MG PO TABS: 600.0000 mg | ORAL_TABLET | Freq: Three times a day (TID) | ORAL | Status: DC | PRN

## 2014-10-01 MED ORDER — ADULT MULTIVITAMIN W/MINERALS CH
1.0000 | ORAL_TABLET | Freq: Every day | ORAL | Status: DC
  Administered 2014-10-02: 1 via ORAL
  Filled 2014-10-01: qty 1

## 2014-10-01 MED ORDER — GABAPENTIN 100 MG PO CAPS
200.0000 mg | ORAL_CAPSULE | Freq: Three times a day (TID) | ORAL | Status: DC
  Administered 2014-10-02 (×3): 200 mg via ORAL
  Filled 2014-10-01 (×4): qty 2

## 2014-10-01 MED ORDER — NICOTINE 21 MG/24HR TD PT24
21.0000 mg | MEDICATED_PATCH | Freq: Every day | TRANSDERMAL | Status: DC
  Administered 2014-10-01: 21 mg via TRANSDERMAL
  Filled 2014-10-01: qty 1

## 2014-10-01 MED ORDER — NICOTINE 21 MG/24HR TD PT24
21.0000 mg | MEDICATED_PATCH | Freq: Every day | TRANSDERMAL | Status: DC
Start: 2014-10-02 — End: 2014-10-02
  Filled 2014-10-01: qty 1

## 2014-10-01 MED ORDER — ADULT MULTIVITAMIN W/MINERALS CH
1.0000 | ORAL_TABLET | Freq: Every day | ORAL | Status: DC
  Administered 2014-10-01: 1 via ORAL
  Filled 2014-10-01: qty 1

## 2014-10-01 MED ORDER — PANTOPRAZOLE SODIUM 40 MG PO TBEC
40.0000 mg | DELAYED_RELEASE_TABLET | Freq: Every day | ORAL | Status: DC
  Administered 2014-10-02: 40 mg via ORAL
  Filled 2014-10-01: qty 1

## 2014-10-01 MED ORDER — LORAZEPAM 1 MG PO TABS: 0.0000 mg | ORAL_TABLET | Freq: Two times a day (BID) | ORAL | Status: DC

## 2014-10-01 MED ORDER — HYDROXYZINE HCL 25 MG PO TABS: 25.0000 mg | ORAL_TABLET | Freq: Three times a day (TID) | ORAL | Status: DC | PRN

## 2014-10-01 MED ORDER — PANTOPRAZOLE SODIUM 40 MG PO TBEC
40.0000 mg | DELAYED_RELEASE_TABLET | Freq: Every day | ORAL | Status: DC
  Administered 2014-10-01: 40 mg via ORAL
  Filled 2014-10-01: qty 1

## 2014-10-01 MED ORDER — ACETAMINOPHEN 325 MG PO TABS: 650.0000 mg | ORAL_TABLET | ORAL | Status: DC | PRN

## 2014-10-01 MED ORDER — ACETAMINOPHEN 325 MG PO TABS
650.0000 mg | ORAL_TABLET | ORAL | Status: DC | PRN
Start: 2014-10-01 — End: 2014-10-02

## 2014-10-01 MED ORDER — GABAPENTIN 100 MG PO CAPS
200.0000 mg | ORAL_CAPSULE | Freq: Three times a day (TID) | ORAL | Status: DC
  Administered 2014-10-01: 200 mg via ORAL
  Filled 2014-10-01: qty 2

## 2014-10-01 MED ORDER — LORAZEPAM 1 MG PO TABS: 0.0000 mg | ORAL_TABLET | Freq: Four times a day (QID) | ORAL | Status: DC

## 2014-10-01 MED ORDER — HYDROXYZINE HCL 25 MG PO TABS
25.0000 mg | ORAL_TABLET | Freq: Three times a day (TID) | ORAL | Status: DC | PRN
  Administered 2014-10-01: 25 mg via ORAL
  Filled 2014-10-01: qty 1

## 2014-10-01 MED ORDER — BUPROPION HCL ER (XL) 150 MG PO TB24
150.0000 mg | ORAL_TABLET | Freq: Every day | ORAL | Status: DC
  Administered 2014-10-02: 150 mg via ORAL
  Filled 2014-10-01: qty 1

## 2014-10-01 MED ORDER — BUPROPION HCL ER (XL) 150 MG PO TB24
150.0000 mg | ORAL_TABLET | Freq: Every day | ORAL | Status: DC
Start: 2014-10-01 — End: 2014-10-01
  Administered 2014-10-01: 150 mg via ORAL
  Filled 2014-10-01: qty 1

## 2014-10-01 MED ORDER — LORAZEPAM 1 MG PO TABS
0.0000 mg | ORAL_TABLET | Freq: Four times a day (QID) | ORAL | Status: DC
  Administered 2014-10-01: 1 mg via ORAL
  Filled 2014-10-01: qty 1

## 2014-10-01 MED ORDER — IBUPROFEN 600 MG PO TABS: 600.0000 mg | ORAL_TABLET | Freq: Three times a day (TID) | ORAL | Status: DC | PRN
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
## 2014-10-01 NOTE — ED Provider Notes (Signed)
CSN: [id number]     Arrival date & time 10/01/14  0507 History   First MD Initiated Contact with Patient 10/01/14 0700     Chief Complaint  Patient presents with  . Suicidal     (Consider location/radiation/quality/duration/timing/severity/associated sxs/prior Treatment) HPI  44 year old male with a history of depression, bipolar disorder, and schizophrenia presents with increasing auditory hallucination/voices and suicidal thoughts over the past 1 week. He states he has had these symptoms for "a while" but it is definitely worsening. Used to be on psychiatric medicines but has not taken them in at least 2 months. Patient states the voices are not his own and tell him to kill himself. He does have increasing thoughts of suicide and has attempted suicide in the past, several years ago. No homicidal thoughts. Patient denies a other illness such as fever, cough, chest pain, or vomiting.  Past Medical History  Diagnosis Date  . Depression   . Anxiety   . Bipolar 1 disorder   . No pertinent past medical history   . Schizophrenia    Past Surgical History  Procedure Laterality Date  . No past surgeries     Family History  Problem Relation Age of Onset  . Hypertension Other   . Diabetes Other    Social History  Substance Use Topics  . Smoking status: Current Every Day Smoker -- 1.00 packs/day    Types: Cigarettes  . Smokeless tobacco: Never Used  . Alcohol Use: 7.2 oz/week    12 Cans of beer per week     Comment: heavy 12 pk/day- as of 12/22/13    Review of Systems  Constitutional: Negative for fever.  Respiratory: Negative for cough.   Gastrointestinal: Negative for vomiting.  Psychiatric/Behavioral: Positive for suicidal ideas, hallucinations and dysphoric mood.  All other systems reviewed and are negative.     Allergies  Review of patient's allergies indicates no known allergies.  Home Medications   Prior to Admission medications   Medication Sig Start Date End Date  Taking? Authorizing Provider  acetaminophen (TYLENOL) 325 MG tablet Take 650 mg by mouth every 6 (six) hours as needed for mild pain.   Yes Historical Provider, MD  buPROPion (WELLBUTRIN XL) 150 MG 24 hr tablet Take 1 tablet (150 mg total) by mouth daily. Patient not taking: Reported on 10/01/2014 06/19/14   Shuvon B Rankin, NP  gabapentin (NEURONTIN) 100 MG capsule Take 2 capsules (200 mg total) by mouth 3 (three) times daily. Patient not taking: Reported on 10/01/2014 06/19/14   Shuvon B Rankin, NP  hydrOXYzine (ATARAX/VISTARIL) 25 MG tablet Take 1 tablet (25 mg total) by mouth 3 (three) times daily as needed for anxiety. Patient not taking: Reported on 10/01/2014 06/19/14   Shuvon B Rankin, NP  Multiple Vitamin (MULTIVITAMIN WITH MINERALS) TABS tablet Take 1 tablet by mouth daily. Patient not taking: Reported on 10/01/2014 06/19/14   Shuvon B Rankin, NP  zolpidem (AMBIEN) 10 MG tablet Take 1 tablet (10 mg total) by mouth at bedtime as needed for sleep. 06/19/14 07/19/14  Shuvon B Rankin, NP   BP 143/86 mmHg  Pulse 97  Temp(Src) 98.5 F (36.9 C) (Oral)  Resp 20  Ht  (1.778 m)  Wt 185 lb (83.915 kg)  BMI 26.54 kg/m2  SpO2 99% Physical Exam  Constitutional: He is oriented to person, place, and time. He appears well-developed and well-nourished. No distress.  HENT:  Head: Normocephalic and atraumatic.  Right Ear: External ear normal.  Left Ear: External ear  normal.  Nose: Nose normal.  Eyes: Right eye exhibits no discharge. Left eye exhibits no discharge.  Neck: Neck supple.  Cardiovascular: Normal rate, regular rhythm, normal heart sounds and intact distal pulses.   Pulmonary/Chest: Effort normal and breath sounds normal.  Abdominal: Soft. There is no tenderness.  Musculoskeletal: He exhibits no edema.  Neurological: He is alert and oriented to person, place, and time.  Skin: Skin is warm and dry. He is not diaphoretic.  Psychiatric: He has a normal mood and affect. His speech is  normal and behavior is normal. He expresses suicidal ideation.  Calm, goal oriented. Does not appear to be responding to internal stimuli.   Nursing note and vitals reviewed.   ED Course  Procedures (including critical care time) Labs Review Labs Reviewed  COMPREHENSIVE METABOLIC PANEL - Abnormal; Notable for the following:    Chloride 99 (*)    Total Bilirubin 1.4 (*)    All other components within normal limits  ACETAMINOPHEN LEVEL - Abnormal; Notable for the following:    Acetaminophen (Tylenol), Serum <10 (*)    All other components within normal limits  URINE RAPID DRUG SCREEN, HOSP PERFORMED - Abnormal; Notable for the following:    Cocaine POSITIVE (*)    All other components within normal limits  ETHANOL  SALICYLATE LEVEL  CBC    Imaging Review No results found. I have personally reviewed and evaluated these images and lab results as part of my medical decision-making.   EKG Interpretation None      MDM   Final diagnoses:  Depression    Patient has been medically cleared. He is well-appearing and his vital signs show no significant abnormalities. Plan to consult psychiatry and await their disposition.    Pricilla Loveless, MD 10/01/14 [phone]

## 2014-10-01 NOTE — ED Notes (Signed)
Pt states he is having suicidal thoughts, needs detox from alcohol and crack, and having hallucinations  Pt states his plan is to hang himself and states he has the means to do so  Pt states he uses drugs and alcohol daily and last used both last night  PT states he has not been on his meds for the past 3 to 4 months  Pt states he lives in [place] and does not have the means for transportation to go to counceling  Pt states he is sometimes homeless and sometimes stays with his mother

## 2014-10-01 NOTE — Progress Notes (Addendum)
CM spoke with pt who confirms uninsured [employer] resident with no pcp.  CM discussed and provided written information for uninsured accepting pcps, discussed the importance of pcp vs EDP services for f/u care, www.needymeds.org, www.goodrx.com, discounted pharmacies and other [employer] such as [facility] , [employer], affordable care act, financial assistance, uninsured dental services,  med assist, DSS and  health department  Reviewed resources for [employer] uninsured accepting pcps like Franklin [name], family medicine at [facility], [facility], [facility], local urgent care centers, [facility], [facility] family practice, general medical clinics, family services of the [place], [facility] urgent care plus others, medication resources, [facility] out patient pharmacies and housing Pt voiced understanding and appreciation of resources provided   Provided [facility] contact information Pt seen by [facility] clinical liason

## 2014-10-01 NOTE — Progress Notes (Signed)
Pt A & O X 4. Pt presents to [facility] with flat affect and depressed mood.  Denied SI, HI, AVH and pain when assessed on initial contact, stated "I need help with getting sober from drugs and ETOH". Per report and as confirmed by pt he's been having SI with plan to hang himself as well as AH. Pt has been noncompliant with ordered medications X 4 months and has been using ETOH and cocaine daily. Pt reported that he last used cocaine and ETOH last night prior to this admission. Pt states he lives in [place] and has no means of transportation to his appointments. Vitals WNL at time of assessment and pt denied active withdrawal symptoms at this time. Q 15 minutes checks maintained for safety without gestures or event of self injurious behavior to report at present.

## 2014-10-01 NOTE — Progress Notes (Addendum)
Pt is scheduled to be d/c to [facility] (Observation unit) after 8:00 pm. Will inform oncoming shift via nursing report. Q 15 minutes checks maintained for safety as ordered. No distress evident thus far this shift.

## 2014-10-01 NOTE — Progress Notes (Signed)
[facility] INPATIENT:  Family/Significant Other Suicide Prevention Education  Suicide Prevention Education:  Patient Refusal for Family/Significant Other Suicide Prevention Education: The patient Franklin [name] has refused to provide written consent for family/significant other to be provided Family/Significant Other Suicide Prevention Education during admission and/or prior to discharge.    Angeline Slim M 10/01/2014, 9:54 PM

## 2014-10-01 NOTE — Progress Notes (Signed)
[facility] Observation Crisis Plan  Reason for Crisis Plan:  Crisis Stabilization   Plan of Care:  Referral for Substance Abuse  Family Support:      Current Living Environment:  Living Arrangements: Parent  Insurance:   Hospital Account    Name Acct ID Class Status Primary Coverage   Franklin [name], Franklin [name] [id number] BEHAVIORAL HEALTH OBSERVATION [facility] FOR MH/DD/SAS - [place] [address]        Guarantor Account (for Hospital Account [id number])    Name Relation to Pt Service Area Active? Acct Type   Franklin [name] Self [facility] Yes Behavioral Health   Address Phone       [address] [place], [place] [address] [phone](H)          Coverage Information (for Hospital Account [id number])    F/O Payor/Plan Precert #   [facility] FOR MH/DD/SAS/[place] [address]    Subscriber Subscriber #   Franklin [name] [id number]   Address Phone   [address] [place], [place] [address] [phone]      Legal Guardian:     Primary Care Provider:  No PCP Per Patient  Current Outpatient Providers:  Franklin [name]  Psychiatrist:     Counselor/Therapist:     Compliant with Medications:  No  Additional Information:   Franklin [name] 9/27/201610:29 PM
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
## 2014-10-01 NOTE — Consult Note (Signed)
[facility] Face-to-Face Psychiatry Consult     Patient Identification: Franklin [name] MRN:  [id number] Principal Diagnosis: Bipolar one schizoaffective type with substance induced mood disorder Patient Active Problem List   Diagnosis Date Noted  . Major depressive disorder, recurrent, severe without psychotic features [F33.2]   . Alcohol use disorder, severe, dependence [F10.20] 06/18/2014  . Alcohol dependence with alcohol-induced disorder [F10.29] 05/22/2013  . Major depression [F32.2] 05/22/2013  . Substance induced mood disorder [F19.94] 03/14/2013  . Polysubstance abuse [F19.10] 04/08/2012    Total Time spent with patient: 45 minutes  Subjective:   Franklin [name] is a 44 y.o. male patient  HPI:  Patient 44 y/o AAM with long standing hx of polysubstance abuse and schizoaffective disorder. Patient was doing well after his admission back in June 2016 and had been doing well, until 1 1/2 months ago when he ran out of his medications. Patient is endorsing SI in setting of increasing derogatory comments via [place] and seeing shadowy figures. Patient is also endorsing anhedonia, internal stimuli and racing thoughts. Patient is denying any paranoia and or delusional thoughts at this current time. Patient has a history of prior SA.  Past Psychiatric History: GAD, Bipolar 1, Schizophrenia  Risk to Self:  yes Risk to Others:  no Prior Inpatient Therapy:  yes Prior Outpatient Therapy:  yes  Past Medical History:  Past Medical History  Diagnosis Date  . Depression   . Anxiety   . Bipolar 1 disorder   . No pertinent past medical history   . Schizophrenia     Past Surgical History  Procedure Laterality Date  . No past surgeries     Family History:  Family History  Problem Relation Age of Onset  . Hypertension Other   . Diabetes Other    Family Psychiatric  History: unknown Social History:  History  Alcohol Use  . 7.2 oz/week  . 12 Cans of beer per week    Comment: heavy 12 pk/day- as  of 12/22/13     History  Drug Use  . Yes  . Special: "Crack" cocaine, Cocaine    Comment: crack    Social History   Social History  . Marital Status: Single    Spouse Name: N/A  . Number of Children: N/A  . Years of Education: N/A   Social History Main Topics  . Smoking status: Current Every Day Smoker -- 1.00 packs/day    Types: Cigarettes  . Smokeless tobacco: Never Used  . Alcohol Use: 7.2 oz/week    12 Cans of beer per week     Comment: heavy 12 pk/day- as of 12/22/13  . Drug Use: Yes    Special: "Crack" cocaine, Cocaine     Comment: crack  . Sexual Activity: Yes   Other Topics Concern  . None   Social History Narrative   Additional Social History:    Pain Medications: pt denies abuse - see PTA meds list Prescriptions: pt denies abuse - see PTA meds list Over the Counter: pt denies abuse - see PTA meds list Negative Consequences of Use: Financial, Personal relationships, Scientist, research (physical sciences), Work / School Name of Substance 1: alcohol 1 - Age of First Use: 15 1 - Amount (size/oz): twenty four 12 oz beers 1 - Frequency: daily 1 - Duration: for past month 1 - Last Use / Amount: 09/30/14 - finished case of beer  Name of Substance 2: crack cocaine 2 - Age of First Use: 20 2 - Amount (size/oz): $100 2 - Frequency: twice  a week 2 - Duration: for  a month 2 - Last Use / Amount: 09/30/14 - $100                 Allergies:  No Known Allergies  Labs:  Results for orders placed or performed during the hospital encounter of 10/01/14 (from the past 48 hour(s))  Urine rapid drug screen (hosp performed) (Not at [facility])     Status: Abnormal   Collection Time: 10/01/14  5:49 AM  Result Value Ref Range   Opiates NONE DETECTED NONE DETECTED   Cocaine POSITIVE (A) NONE DETECTED   Benzodiazepines NONE DETECTED NONE DETECTED   Amphetamines NONE DETECTED NONE DETECTED   Tetrahydrocannabinol NONE DETECTED NONE DETECTED   Barbiturates NONE DETECTED NONE DETECTED    Comment:         DRUG SCREEN FOR MEDICAL PURPOSES ONLY.  IF CONFIRMATION IS NEEDED FOR ANY PURPOSE, NOTIFY LAB WITHIN 5 DAYS.        LOWEST DETECTABLE LIMITS FOR URINE DRUG SCREEN Drug Class       Cutoff (ng/mL) Amphetamine      1000 Barbiturate      200 Benzodiazepine   580 Tricyclics       998 Opiates          300 Cocaine          300 THC              50   Comprehensive metabolic panel     Status: Abnormal   Collection Time: 10/01/14  5:58 AM  Result Value Ref Range   Sodium 135 135 - 145 mmol/L   Potassium 3.7 3.5 - 5.1 mmol/L   Chloride 99 (L) 101 - 111 mmol/L   CO2 25 22 - 32 mmol/L   Glucose, Bld 98 65 - 99 mg/dL   BUN 16 6 - 20 mg/dL   Creatinine, Ser 1.20 0.61 - 1.24 mg/dL   Calcium 9.5 8.9 - 10.3 mg/dL   Total Protein 7.8 6.5 - 8.1 g/dL   Albumin 4.7 3.5 - 5.0 g/dL   AST 22 15 - 41 U/L   ALT 21 17 - 63 U/L   Alkaline Phosphatase 54 38 - 126 U/L   Total Bilirubin 1.4 (H) 0.3 - 1.2 mg/dL   GFR calc non Af Amer >60 >60 mL/min   GFR calc Af Amer >60 >60 mL/min    Comment: (NOTE) The eGFR has been calculated using the CKD EPI equation. This calculation has not been validated in all clinical situations. eGFR's persistently <60 mL/min signify possible Chronic Kidney Disease.    Anion gap 11 5 - 15  Ethanol (ETOH)     Status: None   Collection Time: 10/01/14  5:58 AM  Result Value Ref Range   Alcohol, Ethyl (B) <5 <5 mg/dL    Comment:        LOWEST DETECTABLE LIMIT FOR SERUM ALCOHOL IS 5 mg/dL FOR MEDICAL PURPOSES ONLY   Salicylate level     Status: None   Collection Time: 10/01/14  5:58 AM  Result Value Ref Range   Salicylate Lvl <3.3 2.8 - 30.0 mg/dL  Acetaminophen level     Status: Abnormal   Collection Time: 10/01/14  5:58 AM  Result Value Ref Range   Acetaminophen (Tylenol), Serum <10 (L) 10 - 30 ug/mL    Comment:        THERAPEUTIC CONCENTRATIONS VARY SIGNIFICANTLY. A RANGE OF 10-30 ug/mL MAY BE AN EFFECTIVE CONCENTRATION FOR MANY PATIENTS.  HOWEVER, SOME ARE  BEST TREATED AT CONCENTRATIONS OUTSIDE THIS RANGE. ACETAMINOPHEN CONCENTRATIONS >150 ug/mL AT 4 HOURS AFTER INGESTION AND >50 ug/mL AT 12 HOURS AFTER INGESTION ARE OFTEN ASSOCIATED WITH TOXIC REACTIONS.   CBC     Status: None   Collection Time: 10/01/14  5:58 AM  Result Value Ref Range   WBC 9.4 4.0 - 10.5 K/uL   RBC 5.01 4.22 - 5.81 MIL/uL   Hemoglobin 15.2 13.0 - 17.0 g/dL   HCT 44.3 39.0 - 52.0 %   MCV 88.4 78.0 - 100.0 fL   MCH 30.3 26.0 - 34.0 pg   MCHC 34.3 30.0 - 36.0 g/dL   RDW 14.1 11.5 - 15.5 %   Platelets 295 150 - 400 K/uL    Current Facility-Administered Medications  Medication Dose Route Frequency Provider Last Rate Last Dose  . acetaminophen (TYLENOL) tablet 650 mg  650 mg Oral Q4H PRN Delfin Gant, NP      . Derrill Memo ON 10/02/2014] buPROPion (WELLBUTRIN XL) 24 hr tablet 150 mg  150 mg Oral Daily Delfin Gant, NP      . Derrill Memo ON 10/02/2014] gabapentin (NEURONTIN) capsule 200 mg  200 mg Oral TID Delfin Gant, NP      . hydrOXYzine (ATARAX/VISTARIL) tablet 25 mg  25 mg Oral TID PRN Delfin Gant, NP   25 mg at 10/01/14 2332  . ibuprofen (ADVIL,MOTRIN) tablet 600 mg  600 mg Oral Q8H PRN Delfin Gant, NP      . Derrill Memo ON 10/02/2014] LORazepam (ATIVAN) tablet 0-4 mg  0-4 mg Oral 4 times per day Delfin Gant, NP   0 mg at 10/01/14 2348   Followed by  . [START ON 10/03/2014] LORazepam (ATIVAN) tablet 0-4 mg  0-4 mg Oral Q12H Delfin Gant, NP      . Derrill Memo ON 10/02/2014] multivitamin with minerals tablet 1 tablet  1 tablet Oral Daily Delfin Gant, NP      . Derrill Memo ON 10/02/2014] nicotine (NICODERM CQ - dosed in mg/24 hours) patch 21 mg  21 mg Transdermal Daily Delfin Gant, NP      . Derrill Memo ON 10/02/2014] pantoprazole (PROTONIX) EC tablet 40 mg  40 mg Oral Daily Delfin Gant, NP        Musculoskeletal: Strength & Muscle Tone: within normal limits Gait & Station: normal Patient leans: N/A  Psychiatric Specialty  Exam: Review of Systems  Unable to perform ROS   Blood pressure 130/95, pulse 77, temperature 98.3 F (36.8 C), temperature source Oral, resp. rate 20, height $RemoveBe'5\' 10"'[id number]$  (1.778 m), weight 77.111 kg (170 lb), SpO2 100 %.Body mass index is 24.39 kg/(m^2).  General Appearance: Disheveled  Eye Contact::  Good  Speech:  Clear and Coherent  Volume:  Normal  Mood:  Anxious and Hopeless  Affect:  Congruent  Thought Process:  Circumstantial  Orientation:  Full (Time, Place, and Person)  Thought Content:  Negative  Suicidal Thoughts:  Yes.  with intent/plan  Homicidal Thoughts:  No  Memory:  Immediate;   Good  Judgement:  Poor  Insight:  Fair  Psychomotor Activity:  Negative  Concentration:  Good  Recall:  Good  Fund of Knowledge:Fair  Language: Good  Akathisia:  Negative  Handed:  Right  AIMS (if indicated):     Assets:  Desire for Improvement  ADL's:  Intact  Cognition: WNL  Sleep:      Treatment Plan Summary: Plan Continue observation status overnight, seek placement as  in-patient status for continued mgmt i.e.  deescalation, stabilization and psychoptropic intervention of dual diagnosis bipolar schizoaffective disorder and polysubstance abuse.  Disposition: Transfer to in-patient bed   SIMON,SPENCER E 10/01/2014 11:56 PM

## 2014-10-01 NOTE — ED Notes (Signed)
Pt has sandals, shirt, jeans, underwear, hat and a pack of cigarettes.  Pt seen and wanded by security.

## 2014-10-01 NOTE — BH Assessment (Signed)
[facility] Assessment Progress Note  Per Thedore Mins, MD, this pt would benefit from admission to the [facility] Observation Unit at this time.  Berneice Heinrich, RN, [facility] has assigned pt to Obs 6.  Pt has signed Voluntary Admission and Consent for Treatment, as well as Consent to Release Information to no one, and signed forms have been faxed to [facility].  Pt's nurse, Lincoln Maxin, has been notified, and agrees to send original paperwork along with pt via Juel Burrow, and to call report to [phone] or [phone].  Doylene Canning, MA Triage Specialist [phone]

## 2014-10-02 DIAGNOSIS — F1994 Other psychoactive substance use, unspecified with psychoactive substance-induced mood disorder: Secondary | ICD-10-CM | POA: Diagnosis not present

## 2014-10-02 DIAGNOSIS — F1410 Cocaine abuse, uncomplicated: Secondary | ICD-10-CM | POA: Diagnosis not present

## 2014-10-02 DIAGNOSIS — F1420 Cocaine dependence, uncomplicated: Secondary | ICD-10-CM | POA: Diagnosis present

## 2014-10-02 MED ORDER — ADULT MULTIVITAMIN W/MINERALS CH: 1.0000 | ORAL_TABLET | Freq: Every day | ORAL | Status: DC

## 2014-10-02 MED ORDER — GABAPENTIN 100 MG PO CAPS: 200.0000 mg | ORAL_CAPSULE | Freq: Three times a day (TID) | ORAL | Status: DC

## 2014-10-02 MED ORDER — LORAZEPAM 1 MG PO TABS: 1.0000 mg | ORAL_TABLET | Freq: Four times a day (QID) | ORAL | Status: DC | PRN

## 2014-10-02 MED ORDER — HYDROXYZINE HCL 25 MG PO TABS: 25.0000 mg | ORAL_TABLET | Freq: Three times a day (TID) | ORAL | Status: DC | PRN

## 2014-10-02 MED ORDER — BUPROPION HCL ER (XL) 150 MG PO TB24: 150.0000 mg | ORAL_TABLET | Freq: Every day | ORAL | Status: DC

## 2014-10-02 NOTE — Progress Notes (Signed)
NSG D/C Note:Pt denies si/hi at this time. States that he will comply with outpt services and take his meds as prescribed. D/C to lobby with [employer]. . 

## 2014-10-02 NOTE — Progress Notes (Signed)
Nursing DAR Note:  Pateint is resting in bed with eyes closed mostly, is cooperative when awakened and compliant with medications. Patient denies any SI/HI/AVH, and states "not right now" when specifically answering question of AH. Nursing providing continuous q 15 minute checks for safety and support and therapeutic communication. Patient remains safe on unit.

## 2014-10-02 NOTE — BHH Counselor (Addendum)
[facility] Assessment Progress Note  Per Nanine Means, DNP, pt will be d/c with OP resources. Counselor spoke with pt who indicated that he wants to go to [facility] on Friday, 9/30th, after his court date for treatment and then relocate to [facility] or [place]. He requested counselor provide a list of ACTT teams in those regions. Counselor constructed the list to give to pt upon d/c.  Pt called [facility] and left message w/ [name], about pt possibly being admitted there on Friday, 9/30. Waiting on call back.   Franklin Shock. [name], MS, NCC, [facility] Counselor     [phone] Couselor called [facility] back and spoke to [name] concerning pt going there for treatment. [name] indicated that due to pt being there "several times", she would have to staff his case with her supervisor. She advised that pt call her after his court date on Friday and she would know something then.

## 2014-10-02 NOTE — BHH Counselor (Signed)
Pt reports that he is currently homeless and has no where to go. Pt was provided with a list of homeless men's shelter's located here in the [place] area. Pt was receptive of the information provided and will be reassessed by an AM extender. This Clinical research associate will continue to provide pt with encouragement and support. Pt could benefit from being linked up with a local ACTT team upon discharge to further assist him with becoming mentally stable. It is recommended by Donell Sievert, PA that the pt needs to be observed throughout the night, seek placement as in-patient status for continued mgmt i.e. deescalation, stabilization and psychoptropic intervention of dual diagnosis bipolar schizoaffective disorder and polysubstance abuse.  Zenaida Deed OBS Counselor

## 2014-10-02 NOTE — H&P (Signed)
[facility] Observation Unit Admission  Patient Identification: Franklin [name] MRN:  [id number] Date of Evaluation:  10/02/2014 Chief Complaint:  COCAINE USE DISORDER,SEVERE ALCOHOL USE DISORDER,SEVERE Principal Diagnosis: Substance induced mood disorder Diagnosis:   Patient Active Problem List   Diagnosis Date Noted  . Cocaine abuse [F14.10] 10/02/2014    Priority: High  . Alcohol dependence with alcohol-induced disorder [F10.29] 05/22/2013    Priority: High  . Major depression [F32.2] 05/22/2013    Priority: High  . Substance induced mood disorder [F19.94] 03/14/2013    Priority: High  . Polysubstance abuse [F19.10] 04/08/2012    Priority: High  . Major depressive disorder, recurrent, severe without psychotic features [F33.2]   . Alcohol use disorder, severe, dependence [F10.20] 06/18/2014   History of Present Illness:: 44 yo male presented to the hospital with some suicidal ideations and auditory hallucinations, no suicide plan--well known to this facility and providers.  History of depression, alcohol and cocaine abuse.  He currently is living with his mother but this is "not going so well."  Stated on admission to the ED that he was hearing voices, most likely cocaine induced.  He is not responding to internal stimuli today.  Reports drinking a case of beer prior to admission but negative for alcohol.  Reports using cocaine and positive on labs, no withdrawal symptoms.  Franklin [name] has a court date on Friday for a DWI and then would like to go to [name].  He has had multiple psychiatric and substance abuse rehab admissions.   Associated Signs/Symptoms: Depression Symptoms:  depressed mood, (Hypo) Manic Symptoms:  none Anxiety Symptoms:  none Psychotic Symptoms:  none PTSD Symptoms: none Total Time spent with patient: 45 minutes  Past Psychiatric History: Major depression, alcohol and cocaine abuse  Risk to Self:  None Risk to Others:  None Prior Inpatient Therapy:  Multiple admissions to  [facility] and [facility] and rehabs Prior Outpatient Therapy:  [facility]  Alcohol Screening: 1. How often do you have a drink containing alcohol?: 4 or more times a week 2. How many drinks containing alcohol do you have on a typical day when you are drinking?: 10 or more 3. How often do you have six or more drinks on one occasion?: Daily or almost daily Preliminary Score: 8 4. How often during the last year have you found that you were not able to stop drinking once you had started?: Daily or almost daily 5. How often during the last year have you failed to do what was normally expected from you becasue of drinking?: Weekly 6. How often during the last year have you needed a first drink in the morning to get yourself going after a heavy drinking session?: Weekly 7. How often during the last year have you had a feeling of guilt of remorse after drinking?: Weekly 8. How often during the last year have you been unable to remember what happened the night before because you had been drinking?: Weekly 9. Have you or someone else been injured as a result of your drinking?: Yes, but not in the last year 10. Has a relative or friend or a doctor or another health worker been concerned about your drinking or suggested you cut down?: Yes, during the last year Alcohol Use Disorder Identification Test Final Score (AUDIT): 34 Substance Abuse History in the last 12 months:  Yes.   Consequences of Substance Abuse: Legal Consequences:  DWI charges, court on Friday Previous Psychotropic Medications: No  Psychological Evaluations: Yes  Past  Medical History:  Past Medical History  Diagnosis Date  . Depression   . Anxiety   . Bipolar 1 disorder   . No pertinent past medical history   . Schizophrenia     Past Surgical History  Procedure Laterality Date  . No past surgeries     Family History:  Family History  Problem Relation Age of Onset  . Hypertension Other   . Diabetes Other     Family Psychiatric  History: Depression Social History:  History  Alcohol Use  . 7.2 oz/week  . 12 Cans of beer per week    Comment: heavy 12 pk/day- as of 12/22/13     History  Drug Use  . Yes  . Special: "Crack" cocaine, Cocaine    Comment: crack    Social History   Social History  . Marital Status: Single    Spouse Name: N/A  . Number of Children: N/A  . Years of Education: N/A   Social History [name] Topics  . Smoking status: Current Every Day Smoker -- 1.00 packs/day    Types: Cigarettes  . Smokeless tobacco: Never Used  . Alcohol Use: 7.2 oz/week    12 Cans of beer per week     Comment: heavy 12 pk/day- as of 12/22/13  . Drug Use: Yes    Special: "Crack" cocaine, Cocaine     Comment: crack  . Sexual Activity: Yes   Other Topics Concern  . None   Social History Narrative   Additional Social History:    Pain Medications: pt denies abuse - see PTA meds list Prescriptions: pt denies abuse - see PTA meds list Over the Counter: pt denies abuse - see PTA meds list Negative Consequences of Use: Financial, Personal relationships, Scientist, research (physical sciences), Work / School Name of Substance 1: alcohol 1 - Age of First Use: 15 1 - Amount (size/oz): twenty four 12 oz beers 1 - Frequency: daily 1 - Duration: for past month 1 - Last Use / Amount: 09/30/14 - finished case of beer  Name of Substance 2: crack cocaine 2 - Age of First Use: 20 2 - Amount (size/oz): $100 2 - Frequency: twice a week 2 - Duration: for  a month 2 - Last Use / Amount: 09/30/14 - $100                Allergies:  No Known Allergies Lab Results:  Results for orders placed or performed during the hospital encounter of 10/01/14 (from the past 48 hour(s))  Urine rapid drug screen (hosp performed) (Not at [facility]     Status: Abnormal   Collection Time: 10/01/14  5:49 AM  Result Value Ref Range   Opiates NONE DETECTED NONE DETECTED   Cocaine POSITIVE (A) NONE DETECTED   Benzodiazepines NONE DETECTED NONE  DETECTED   Amphetamines NONE DETECTED NONE DETECTED   Tetrahydrocannabinol NONE DETECTED NONE DETECTED   Barbiturates NONE DETECTED NONE DETECTED    Comment:        DRUG SCREEN FOR MEDICAL PURPOSES ONLY.  IF CONFIRMATION IS NEEDED FOR ANY PURPOSE, NOTIFY LAB WITHIN 5 DAYS.        LOWEST DETECTABLE LIMITS FOR URINE DRUG SCREEN Drug Class       Cutoff (ng/mL) Amphetamine      1000 Barbiturate      200 Benzodiazepine   767 Tricyclics       209 Opiates          300 Cocaine  300 THC              50   Comprehensive metabolic panel     Status: Abnormal   Collection Time: 10/01/14  5:58 AM  Result Value Ref Range   Sodium 135 135 - 145 mmol/L   Potassium 3.7 3.5 - 5.1 mmol/L   Chloride 99 (L) 101 - 111 mmol/L   CO2 25 22 - 32 mmol/L   Glucose, Bld 98 65 - 99 mg/dL   BUN 16 6 - 20 mg/dL   Creatinine, Ser 1.20 0.61 - 1.24 mg/dL   Calcium 9.5 8.9 - 10.3 mg/dL   Total Protein 7.8 6.5 - 8.1 g/dL   Albumin 4.7 3.5 - 5.0 g/dL   AST 22 15 - 41 U/L   ALT 21 17 - 63 U/L   Alkaline Phosphatase 54 38 - 126 U/L   Total Bilirubin 1.4 (H) 0.3 - 1.2 mg/dL   GFR calc non Af Amer >60 >60 mL/min   GFR calc Af Amer >60 >60 mL/min    Comment: (NOTE) The eGFR has been calculated using the CKD EPI equation. This calculation has not been validated in all clinical situations. eGFR's persistently <60 mL/min signify possible Chronic Kidney Disease.    Anion gap 11 5 - 15  Ethanol (ETOH)     Status: None   Collection Time: 10/01/14  5:58 AM  Result Value Ref Range   Alcohol, Ethyl (B) <5 <5 mg/dL    Comment:        LOWEST DETECTABLE LIMIT FOR SERUM ALCOHOL IS 5 mg/dL FOR MEDICAL PURPOSES ONLY   Salicylate level     Status: None   Collection Time: 10/01/14  5:58 AM  Result Value Ref Range   Salicylate Lvl <4.9 2.8 - 30.0 mg/dL  Acetaminophen level     Status: Abnormal   Collection Time: 10/01/14  5:58 AM  Result Value Ref Range   Acetaminophen (Tylenol), Serum <10 (L) 10 - 30 ug/mL     Comment:        THERAPEUTIC CONCENTRATIONS VARY SIGNIFICANTLY. A RANGE OF 10-30 ug/mL MAY BE AN EFFECTIVE CONCENTRATION FOR MANY PATIENTS. HOWEVER, SOME ARE BEST TREATED AT CONCENTRATIONS OUTSIDE THIS RANGE. ACETAMINOPHEN CONCENTRATIONS >150 ug/mL AT 4 HOURS AFTER INGESTION AND >50 ug/mL AT 12 HOURS AFTER INGESTION ARE OFTEN ASSOCIATED WITH TOXIC REACTIONS.   CBC     Status: None   Collection Time: 10/01/14  5:58 AM  Result Value Ref Range   WBC 9.4 4.0 - 10.5 K/uL   RBC 5.01 4.22 - 5.81 MIL/uL   Hemoglobin 15.2 13.0 - 17.0 g/dL   HCT 44.3 39.0 - 52.0 %   MCV 88.4 78.0 - 100.0 fL   MCH 30.3 26.0 - 34.0 pg   MCHC 34.3 30.0 - 36.0 g/dL   RDW 14.1 11.5 - 15.5 %   Platelets 295 150 - 201 K/uL    Metabolic Disorder Labs:  Lab Results  Component Value Date   HGBA1C  02/02/2009    5.7 (NOTE) The ADA recommends the following therapeutic goal for glycemic control related to Hgb A1c measurement: Goal of therapy: <6.5 Hgb A1c  Reference: American Diabetes Association: Clinical Practice Recommendations 2010, Diabetes Care, 2010, 33: (Suppl  1).   MPG 117 02/02/2009   No results found for: PROLACTIN Lab Results  Component Value Date   CHOL  07/27/2009    172        ATP III CLASSIFICATION:  <200     mg/dL  Desirable  200-239  mg/dL   Borderline High  >=240    mg/dL   High          TRIG 155* 07/27/2009   HDL 58 07/27/2009   CHOLHDL 3.0 07/27/2009   VLDL 31 07/27/2009   LDLCALC  07/27/2009    83        Total Cholesterol/HDL:CHD Risk Coronary Heart Disease Risk Table                     Men   Women  1/2 Average Risk   3.4   3.3  Average Risk       5.0   4.4  2 X Average Risk   9.6   7.1  3 X Average Risk  23.4   11.0        Use the calculated Patient Ratio above and the CHD Risk Table to determine the patient's CHD Risk.        ATP III CLASSIFICATION (LDL):  <100     mg/dL   Optimal  100-129  mg/dL   Near or Above                    Optimal  130-159  mg/dL    Borderline  160-189  mg/dL   High  >190     mg/dL   Very High   LDLCALC  02/02/2009    60        Total Cholesterol/HDL:CHD Risk Coronary Heart Disease Risk Table                     Men   Women  1/2 Average Risk   3.4   3.3  Average Risk       5.0   4.4  2 X Average Risk   9.6   7.1  3 X Average Risk  23.4   11.0        Use the calculated Patient Ratio above and the CHD Risk Table to determine the patient's CHD Risk.        ATP III CLASSIFICATION (LDL):  <100     mg/dL   Optimal  100-129  mg/dL   Near or Above                    Optimal  130-159  mg/dL   Borderline  160-189  mg/dL   High  >190     mg/dL   Very High    Current Medications: Current Facility-Administered Medications  Medication Dose Route Frequency Provider Last Rate Last Dose  . acetaminophen (TYLENOL) tablet 650 mg  650 mg Oral Q4H PRN Delfin Gant, NP      . buPROPion (WELLBUTRIN XL) 24 hr tablet 150 mg  150 mg Oral Daily Delfin Gant, NP   150 mg at 10/02/14 7017  . gabapentin (NEURONTIN) capsule 200 mg  200 mg Oral TID Delfin Gant, NP   200 mg at 10/02/14 7939  . hydrOXYzine (ATARAX/VISTARIL) tablet 25 mg  25 mg Oral TID PRN Delfin Gant, NP   25 mg at 10/01/14 2332  . ibuprofen (ADVIL,MOTRIN) tablet 600 mg  600 mg Oral Q8H PRN Delfin Gant, NP      . LORazepam (ATIVAN) tablet 0-4 mg  0-4 mg Oral 4 times per day Delfin Gant, NP   0 mg at 10/01/14 2348   Followed by  . [START ON 10/03/2014] LORazepam (ATIVAN) tablet 0-4  mg  0-4 mg Oral Q12H Delfin Gant, NP      . multivitamin with minerals tablet 1 tablet  1 tablet Oral Daily Delfin Gant, NP   1 tablet at 10/02/14 [phone]  . nicotine (NICODERM CQ - dosed in mg/24 hours) patch 21 mg  21 mg Transdermal Daily Delfin Gant, NP   21 mg at 10/02/14 0833  . pantoprazole (PROTONIX) EC tablet 40 mg  40 mg Oral Daily Delfin Gant, NP   40 mg at 10/02/14 0626   PTA Medications: Prescriptions prior to  admission  Medication Sig Dispense Refill Last Dose  . buPROPion (WELLBUTRIN XL) 150 MG 24 hr tablet Take 1 tablet (150 mg total) by mouth daily. 30 tablet 0 10/01/2014 at Unknown time  . gabapentin (NEURONTIN) 100 MG capsule Take 2 capsules (200 mg total) by mouth 3 (three) times daily. 180 capsule 1 10/01/2014 at Unknown time  . Multiple Vitamin (MULTIVITAMIN WITH MINERALS) TABS tablet Take 1 tablet by mouth daily. 30 tablet 0 10/01/2014 at Unknown time  . acetaminophen (TYLENOL) 325 MG tablet Take 650 mg by mouth every 6 (six) hours as needed for mild pain.   Past Month at Unknown time  . hydrOXYzine (ATARAX/VISTARIL) 25 MG tablet Take 1 tablet (25 mg total) by mouth 3 (three) times daily as needed for anxiety. (Patient not taking: Reported on 10/01/2014) 40 tablet 0 Not Taking at Unknown time  . zolpidem (AMBIEN) 10 MG tablet Take 1 tablet (10 mg total) by mouth at bedtime as needed for sleep. 30 tablet 0     Musculoskeletal: Strength & Muscle Tone: within normal limits Gait & Station: normal Patient leans: N/A  Psychiatric Specialty Exam: Physical Exam  Review of Systems  Constitutional: Negative.   HENT: Negative.   Eyes: Negative.   Respiratory: Negative.   Cardiovascular: Negative.   Gastrointestinal: Negative.   Genitourinary: Negative.   Musculoskeletal: Negative.   Skin: Negative.   Neurological: Negative.   Endo/Heme/Allergies: Negative.   Psychiatric/Behavioral: Positive for depression and substance abuse.    Blood pressure 130/95, pulse 77, temperature 98.3 F (36.8 C), temperature source Oral, resp. rate 20, height _0  (1.778 m), weight 77.111 kg (170 lb), SpO2 100 %.Body mass index is 24.39 kg/(m^2).  General Appearance: Casual  Eye Contact::  Good  Speech:  Normal Rate  Volume:  Normal  Mood:  Depressed  Affect:  Congruent  Thought Process:  Coherent  Orientation:  Full (Time, Place, and Person)  Thought Content:  WDL  Suicidal Thoughts:  No but suicidal  ideations on admission to ED  Homicidal Thoughts:  No  Memory:  Immediate;   Fair Recent;   Fair Remote;   Fair  Judgement:  Good  Insight:  Fair  Psychomotor Activity:  Normal  Concentration:  Fair  Recall:  [employer] of Knowledge:Fair  Language: Good  Akathisia:  No  Handed:  Right  AIMS (if indicated):     Assets:  Housing Leisure Time Physical Health Resilience Social Support  ADL's:  Intact  Cognition: WNL  Sleep:  Number of Hours: 4.5     Treatment Plan Summary: Daily contact with patient to assess and evaluate symptoms and progress in treatment, Medication management and Plan Substance induced mood disorder  Observation Level/Precautions:  Detox 15 minute checks  Laboratory:  completed, reviewed, stable  Psychotherapy:  Individual therapy  Medications:  Wellbutrin, vistaril  Consultations:  None  Discharge Concerns:  None  Estimated LOS:  Less than  24 hours  Other:       Waylan Boga, PMH-NP 9/28/201610:16 AM

## 2014-10-02 NOTE — [facility] Suicide Risk Assessment (Addendum)
Suicide Risk Assessment  Discharge Assessment   [facility] Discharge Suicide Risk Assessment   Demographic Factors:  Male  Total Time spent with patient: 30 minutes    Musculoskeletal: Strength & Muscle Tone: within normal limits Gait & Station: normal Patient leans: N/A  Psychiatric Specialty Exam: Physical Exam  Review of Systems  Constitutional: Negative.  HENT: Negative.  Eyes: Negative.  Respiratory: Negative.  Cardiovascular: Negative.  Gastrointestinal: Negative.  Genitourinary: Negative.  Musculoskeletal: Negative.  Skin: Negative.  Neurological: Negative.  Endo/Heme/Allergies: Negative.  Psychiatric/Behavioral: Positive for depression and substance abuse.    Blood pressure 130/95, pulse 77, temperature 98.3 F (36.8 C), temperature source Oral, resp. rate 20, height  (1.778 m), weight 77.111 kg (170 lb), SpO2 100 %.Body mass index is 24.39 kg/(m^2).  General Appearance: Casual  Eye Contact:: Good  Speech: Normal Rate  Volume: Normal  Mood: Depressed, low level  Affect: Congruent  Thought Process: Coherent  Orientation: Full (Time, Place, and Person)  Thought Content: WDL  Suicidal Thoughts: No  Homicidal Thoughts: No  Memory: Immediate; Good Recent; Good Remote; Good  Judgement: Good  Insight: Fair  Psychomotor Activity: Normal  Concentration: Good  Recall: Good  Fund of Knowledge:Fair  Language: Good  Akathisia: No  Handed: Right  AIMS (if indicated):    Assets: Housing Leisure Time Physical Health Resilience Social Support  ADL's: Intact  Cognition: WNL  Sleep: Number of Hours: 4.5     Have you used any form of tobacco in the last 30 days? (Cigarettes, Smokeless Tobacco, Cigars, and/or Pipes): Yes  Has this patient used any form of tobacco in the last 30 days? (Cigarettes, Smokeless Tobacco, Cigars, and/or Pipes) Yes, A prescription for an FDA-approved tobacco cessation  medication was offered at discharge and the patient refused  Mental Status Per Nursing Assessment::   On Admission:  Suicidal ideation indicated by patient, Self-harm thoughts  Current Mental Status by Physician: NA  Loss Factors: NA  Historical Factors: NA  Risk Reduction Factors:   Sense of responsibility to family, Living with another person, especially a relative and Positive social support  Continued Clinical Symptoms:  Depression, mild  Cognitive Features That Contribute To Risk:  None    Suicide Risk:  Minimal: No identifiable suicidal ideation.  Patients presenting with no risk factors but with morbid ruminations; may be classified as minimal risk based on the severity of the depressive symptoms  Principal Problem: Substance induced mood disorder Discharge Diagnoses:  Patient Active Problem List   Diagnosis Date Noted  . Cocaine abuse [F14.10] 10/02/2014    Priority: High  . Alcohol dependence with alcohol-induced disorder [F10.29] 05/22/2013    Priority: High  . Major depression [F32.2] 05/22/2013    Priority: High  . Substance induced mood disorder [F19.94] 03/14/2013    Priority: High  . Polysubstance abuse [F19.10] 04/08/2012    Priority: High  . Major depressive disorder, recurrent, severe without psychotic features [F33.2]   . Alcohol use disorder, severe, dependence [F10.20] 06/18/2014      Plan Of Care/Follow-up recommendations:  Activity:  as tolerated Diet:  heart healthy diet  Is patient on multiple antipsychotic therapies at discharge:  No   Has Patient had three or more failed trials of antipsychotic monotherapy by history:  No  Recommended Plan for Multiple Antipsychotic Therapies: NA    LORD, JAMISON, PMH-NP 10/02/2014, 1:20 PM

## 2014-10-02 NOTE — Progress Notes (Signed)
D: Patient resting in bed with eyes closed.  Respirations even and unlabored.  Patient appears to be in no apparent distress. A: Staff to monitor Q 15 mins for safety.   R:Patient remains safe on the unit.  

## 2014-10-02 NOTE — Discharge Instructions (Signed)
Cocaine °Cocaine stimulates the central nervous system. As a stimulant, cocaine has the ability to improve athletic performance through increasing speed, endurance, and concentration, as well as decreasing fatigue. Although cocaine may seem to be beneficial for athletics, it is highly addicting and has many debilitating side effects. Cocaine has caused the deaths of many athletes, and its use is banned by every major athletic organization in the world. The clinical effect of cocaine (the high) is very short in duration. Cocaine works in the brain by altering the normal concentrations of chemicals that stimulate the brain cells.  °WHY ATHLETES USE IT  °Many athletes use cocaine for its central nervous system stimulating properties. It is also used as a recreational drug due to the euphoric felling it produces.  °ADVERSE EFFECTS  °· Sleep disturbances. °· Abnormal heart rhythms. °· Stroke. °· Heart attack. °· Seizures. °· Elevated blood pressure. °· Death. °· Paranoia (feeling that people want to hurt you). °· Panic attacks (sudden feelings of anxiety or shortness of breath). °· Suicidal behavior (wanting to kill yourself). °· Homicidal behavior (wanting to kill other people). °· Depression (feeling very sad, having decreased energy for activities). °· Poor athletic performance. °PHARMACOLOGY  °Cocaine acts on the body for a short period of time; the clinical effects may last less than1 hour. Since most athletic competitions last for more than 1 hour, cocaine use may not improve athletic performance. The use of cocaine makes individuals much more susceptible for serious conditions such as seizures, arrhythmia (irregular heart beat), and strokes. Even a single dose of cocaine can be detected on a drug test for up to about 30 hours.  °PREVENTION °Most athletes use cocaine as a recreational drug and not for the purpose of enhancing athletic performance. To prevent the use of cocaine, athletes must be educated on its side  effects and the risk of addiction. If an athlete is found using cocaine, counseling and treatment are almost always required.  °Document Released: 12/21/2004 Document Revised: 03/15/2011 Document Reviewed: 04/04/2008 °[employer]® Patient Information ©2015 [employer]. This information is not intended to replace advice given to you by your health care provider. Make sure you discuss any questions you have with your health care provider. ° °

## 2014-10-02 NOTE — Discharge Summary (Signed)
[facility] Observation Unit Discharge Summary *Due to short LOS, only evaluated today  Patient Identification: Franklin [name] MRN: [id number] Date of Evaluation: 10/02/2014 Chief Complaint: COCAINE USE DISORDER,SEVERE ALCOHOL USE DISORDER,SEVERE Principal Diagnosis: Substance induced mood disorder Diagnosis:  Patient Active Problem List   Diagnosis Date Noted  . Cocaine abuse [F14.10] 10/02/2014    Priority: High  . Alcohol dependence with alcohol-induced disorder [F10.29] 05/22/2013    Priority: High  . Major depression [F32.2] 05/22/2013    Priority: High  . Substance induced mood disorder [F19.94] 03/14/2013    Priority: High  . Polysubstance abuse [F19.10] 04/08/2012    Priority: High  . Major depressive disorder, recurrent, severe without psychotic features [F33.2]   . Alcohol use disorder, severe, dependence [F10.20] 06/18/2014   Subjective:  Franklin [name] was admitted to the [facility] Observation Unit, rested quietly and had no issues.  Individual therapy provided with connection to resources.  Patient has stabilized and had no auditory hallucinations, cocaine appears to have cleared his system.  No withdrawal symptoms.  No suicidal/homicidal ideations.  History of Present Illness on Admission:: 44 yo male presented to the hospital with some suicidal ideations and auditory hallucinations, no suicide plan--well known to this facility and providers. History of depression, alcohol and cocaine abuse. He currently is living with his mother but this is "not going so well." Stated on admission to the ED that he was hearing voices, most likely cocaine induced. He is not responding to internal stimuli today. Reports drinking a case of beer prior to admission but negative for alcohol. Reports using cocaine and positive on labs, no withdrawal symptoms. Franklin [name] has a court date on Friday for a DWI and then would like to go to [facility]. He has had multiple psychiatric  and substance abuse rehab admissions.    Associated Signs/Symptoms: Depression Symptoms: depressed mood, (Hypo) Manic Symptoms: none Anxiety Symptoms: none Psychotic Symptoms: none PTSD Symptoms: none Total Time spent with patient: 30 minutes  Past Psychiatric History: Major depression, alcohol and cocaine abuse  Risk to Self:  None Risk to Others:  None Prior Inpatient Therapy:  Multiple admissions to [facility] and rehabs Prior Outpatient Therapy:  [facility]  Alcohol Screening: 1. How often do you have a drink containing alcohol?: 4 or more times a week 2. How many drinks containing alcohol do you have on a typical day when you are drinking?: 10 or more 3. How often do you have six or more drinks on one occasion?: Daily or almost daily Preliminary Score: 8 4. How often during the last year have you found that you were not able to stop drinking once you had started?: Daily or almost daily 5. How often during the last year have you failed to do what was normally expected from you becasue of drinking?: Weekly 6. How often during the last year have you needed a first drink in the morning to get yourself going after a heavy drinking session?: Weekly 7. How often during the last year have you had a feeling of guilt of remorse after drinking?: Weekly 8. How often during the last year have you been unable to remember what happened the night before because you had been drinking?: Weekly 9. Have you or someone else been injured as a result of your drinking?: Yes, but not in the last year 10. Has a relative or friend or a doctor or another health worker been concerned about your drinking or suggested you cut down?: Yes, during the last  year Alcohol Use Disorder Identification Test Final Score (AUDIT): 34 Substance Abuse History in the last 12 months: Yes.  Consequences of Substance Abuse: Legal Consequences: DWI charges, court on Friday Previous  Psychotropic Medications: No  Psychological Evaluations: Yes  Past Medical History:  Past Medical History  Diagnosis Date  . Depression   . Anxiety   . Bipolar 1 disorder   . No pertinent past medical history   . Schizophrenia     Past Surgical History  Procedure Laterality Date  . No past surgeries     Family History:  Family History  Problem Relation Age of Onset  . Hypertension Other   . Diabetes Other    Family Psychiatric History: Depression Social History:  History  Alcohol Use  . 7.2 oz/week  . 12 Cans of beer per week    Comment: heavy 12 pk/day- as of 12/22/13    History  Drug Use  . Yes  . Special: "Crack" cocaine, Cocaine    Comment: crack    Social History   Social History  . Marital Status: Single    Spouse Name: N/A  . Number of Children: N/A  . Years of Education: N/A   Social History [name] Topics  . Smoking status: Current Every Day Smoker -- 1.00 packs/day    Types: Cigarettes  . Smokeless tobacco: Never Used  . Alcohol Use: 7.2 oz/week    12 Cans of beer per week     Comment: heavy 12 pk/day- as of 12/22/13  . Drug Use: Yes    Special: "Crack" cocaine, Cocaine     Comment: crack  . Sexual Activity: Yes   Other Topics Concern  . None   Social History Narrative   Additional Social History:   Pain Medications: pt denies abuse - see PTA meds list Prescriptions: pt denies abuse - see PTA meds list Over the Counter: pt denies abuse - see PTA meds list Negative Consequences of Use: Financial, Personal relationships, [employer] forces operational [employer], Work / School Name of Substance 1: alcohol 1 - Age of First Use: 15 1 - Amount (size/oz): twenty four 12 oz beers 1 - Frequency: daily 1 - Duration: for past month 1 - Last Use / Amount: 09/30/14 - finished case of beer  Name of Substance 2: crack cocaine 2 - Age of First Use: 20 2 - Amount  (size/oz): $100 2 - Frequency: twice a week 2 - Duration: for a month 2 - Last Use / Amount: 09/30/14 - $100                Allergies: No Known Allergies Lab Results:   Lab Results Last 48 Hours    Results for orders placed or performed during the hospital encounter of 10/01/14 (from the past 48 hour(s))  Urine rapid drug screen (hosp performed) (Not at [facility]) Status: Abnormal   Collection Time: 10/01/14 5:49 AM  Result Value Ref Range   Opiates NONE DETECTED NONE DETECTED   Cocaine POSITIVE (A) NONE DETECTED   Benzodiazepines NONE DETECTED NONE DETECTED   Amphetamines NONE DETECTED NONE DETECTED   Tetrahydrocannabinol NONE DETECTED NONE DETECTED   Barbiturates NONE DETECTED NONE DETECTED    Comment:   DRUG SCREEN FOR MEDICAL PURPOSES ONLY. IF CONFIRMATION IS NEEDED FOR ANY PURPOSE, NOTIFY LAB WITHIN 5 DAYS.   LOWEST DETECTABLE LIMITS FOR URINE DRUG SCREEN Drug Class Cutoff (ng/mL) Amphetamine 1000 Barbiturate 200 Benzodiazepine 200 Tricyclics 300 Opiates 300 Cocaine 300 THC 50   Comprehensive metabolic panel Status: Abnormal  Collection Time: 10/01/14 5:58 AM  Result Value Ref Range   Sodium 135 135 - 145 mmol/L   Potassium 3.7 3.5 - 5.1 mmol/L   Chloride 99 (L) 101 - 111 mmol/L   CO2 25 22 - 32 mmol/L   Glucose, Bld 98 65 - 99 mg/dL   BUN 16 6 - 20 mg/dL   Creatinine, Ser 1.20 0.61 - 1.24 mg/dL   Calcium 9.5 8.9 - 10.3 mg/dL   Total Protein 7.8 6.5 - 8.1 g/dL   Albumin 4.7 3.5 - 5.0 g/dL   AST 22 15 - 41 U/L   ALT 21 17 - 63 U/L   Alkaline Phosphatase 54 38 - 126 U/L   Total Bilirubin 1.4 (H) 0.3 - 1.2 mg/dL   GFR calc non Af Amer >60 >60 mL/min   GFR calc Af Amer >60 >60 mL/min    Comment: (NOTE) The eGFR has been calculated using the CKD EPI equation. This  calculation has not been validated in all clinical situations. eGFR's persistently <60 mL/min signify possible Chronic Kidney Disease.    Anion gap 11 5 - 15  Ethanol (ETOH) Status: None   Collection Time: 10/01/14 5:58 AM  Result Value Ref Range   Alcohol, Ethyl (B) <5 <5 mg/dL    Comment:   LOWEST DETECTABLE LIMIT FOR SERUM ALCOHOL IS 5 mg/dL FOR MEDICAL PURPOSES ONLY   Salicylate level Status: None   Collection Time: 10/01/14 5:58 AM  Result Value Ref Range   Salicylate Lvl <3.0 2.8 - 30.0 mg/dL  Acetaminophen level Status: Abnormal   Collection Time: 10/01/14 5:58 AM  Result Value Ref Range   Acetaminophen (Tylenol), Serum <10 (L) 10 - 30 ug/mL    Comment:   THERAPEUTIC CONCENTRATIONS VARY SIGNIFICANTLY. A RANGE OF 10-30 ug/mL MAY BE AN EFFECTIVE CONCENTRATION FOR MANY PATIENTS. HOWEVER, SOME ARE BEST TREATED AT CONCENTRATIONS OUTSIDE THIS RANGE. ACETAMINOPHEN CONCENTRATIONS >150 ug/mL AT 4 HOURS AFTER INGESTION AND >50 ug/mL AT 12 HOURS AFTER INGESTION ARE OFTEN ASSOCIATED WITH TOXIC REACTIONS.   CBC Status: None   Collection Time: 10/01/14 5:58 AM  Result Value Ref Range   WBC 9.4 4.0 - 10.5 K/uL   RBC 5.01 4.22 - 5.81 MIL/uL   Hemoglobin 15.2 13.0 - 17.0 g/dL   HCT 44.3 39.0 - 52.0 %   MCV 88.4 78.0 - 100.0 fL   MCH 30.3 26.0 - 34.0 pg   MCHC 34.3 30.0 - 36.0 g/dL   RDW 14.1 11.5 - 15.5 %   Platelets 295 150 - 400 K/uL      Metabolic Disorder Labs:   Recent Labs    Lab Results  Component Value Date   HGBA1C  02/02/2009    5.7 (NOTE) The ADA recommends the following therapeutic goal for glycemic control related to Hgb A1c measurement: Goal of therapy: <6.5 Hgb A1c Reference: American Diabetes Association: Clinical Practice Recommendations 2010, Diabetes Care, 2010, 33: (Suppl 1).   MPG 117 02/02/2009      Recent  Labs    No results found for: PROLACTIN    Recent Labs    Lab Results  Component Value Date   CHOL  07/27/2009    172  ATP III CLASSIFICATION: <200 mg/dL Desirable 200-239 mg/dL Borderline High >=240 mg/dL High     TRIG 155* 07/27/2009   HDL 58 07/27/2009   CHOLHDL 3.0 07/27/2009   VLDL 31 07/27/2009   LDLCALC  07/27/2009    83  Total Cholesterol/HDL:CHD Risk Coronary Heart Disease Risk Table  Men Women 1/2 Average Risk 3.4 3.3 Average Risk 5.0 4.4 2 X Average Risk 9.6 7.1 3 X Average Risk 23.4 11.0   Use the calculated Patient Ratio above and the CHD Risk Table to determine the patient's CHD Risk.   ATP III CLASSIFICATION (LDL): <100 mg/dL Optimal 100-129 mg/dL Near or Above  Optimal 130-159 mg/dL Borderline 160-189 mg/dL High >190 mg/dL Very High   LDLCALC  02/02/2009    60  Total Cholesterol/HDL:CHD Risk Coronary Heart Disease Risk Table  Men Women 1/2 Average Risk 3.4 3.3 Average Risk 5.0 4.4 2 X Average Risk 9.6 7.1 3 X Average Risk 23.4 11.0   Use the calculated Patient Ratio above and the CHD Risk Table to determine the patient's CHD Risk.   ATP III CLASSIFICATION (LDL): <100 mg/dL Optimal 100-129 mg/dL Near or Above  Optimal 130-159 mg/dL Borderline 160-189 mg/dL High >190 mg/dL Very High      Current Medications: Current Facility-Administered Medications  Medication Dose Route Frequency Provider Last Rate Last Dose  . acetaminophen (TYLENOL) tablet 650 mg 650 mg Oral Q4H PRN Delfin Gant, NP    . buPROPion (WELLBUTRIN XL) 24 hr tablet 150 mg 150 mg Oral Daily Delfin Gant, NP  150 mg at 10/02/14 0932  . gabapentin  (NEURONTIN) capsule 200 mg 200 mg Oral TID Delfin Gant, NP  200 mg at 10/02/14 3557  . hydrOXYzine (ATARAX/VISTARIL) tablet 25 mg 25 mg Oral TID PRN Delfin Gant, NP  25 mg at 10/01/14 2332  . ibuprofen (ADVIL,MOTRIN) tablet 600 mg 600 mg Oral Q8H PRN Delfin Gant, NP    . LORazepam (ATIVAN) tablet 0-4 mg 0-4 mg Oral 4 times per day Delfin Gant, NP  0 mg at 10/01/14 2348   Followed by  . [START ON 10/03/2014] LORazepam (ATIVAN) tablet 0-4 mg 0-4 mg Oral Q12H Delfin Gant, NP    . multivitamin with minerals tablet 1 tablet 1 tablet Oral Daily Delfin Gant, NP  1 tablet at 10/02/14 [phone]  . nicotine (NICODERM CQ - dosed in mg/24 hours) patch 21 mg 21 mg Transdermal Daily Delfin Gant, NP  21 mg at 10/02/14 0833  . pantoprazole (PROTONIX) EC tablet 40 mg 40 mg Oral Daily Delfin Gant, NP  40 mg at 10/02/14 2542   PTA Medications: Prescriptions prior to admission  Medication Sig Dispense Refill Last Dose  . buPROPion (WELLBUTRIN XL) 150 MG 24 hr tablet Take 1 tablet (150 mg total) by mouth daily. 30 tablet 0 10/01/2014 at Unknown time  . gabapentin (NEURONTIN) 100 MG capsule Take 2 capsules (200 mg total) by mouth 3 (three) times daily. 180 capsule 1 10/01/2014 at Unknown time  . Multiple Vitamin (MULTIVITAMIN WITH MINERALS) TABS tablet Take 1 tablet by mouth daily. 30 tablet 0 10/01/2014 at Unknown time  . acetaminophen (TYLENOL) 325 MG tablet Take 650 mg by mouth every 6 (six) hours as needed for mild pain.   Past Month at Unknown time  . hydrOXYzine (ATARAX/VISTARIL) 25 MG tablet Take 1 tablet (25 mg total) by mouth 3 (three) times daily as needed for anxiety. (Patient not taking: Reported on 10/01/2014) 40 tablet 0 Not Taking at Unknown time  . zolpidem (AMBIEN) 10 MG tablet Take 1 tablet (10 mg total) by mouth at bedtime as needed for  sleep. 30 tablet 0     Musculoskeletal: Strength & Muscle Tone: within normal limits Gait & Station: normal Patient leans: N/A  Psychiatric Specialty Exam:  Physical Exam  Review of Systems  Constitutional: Negative.  HENT: Negative.  Eyes: Negative.  Respiratory: Negative.  Cardiovascular: Negative.  Gastrointestinal: Negative.  Genitourinary: Negative.  Musculoskeletal: Negative.  Skin: Negative.  Neurological: Negative.  Endo/Heme/Allergies: Negative.  Psychiatric/Behavioral: Positive for depression and substance abuse.    Blood pressure 130/95, pulse 77, temperature 98.3 F (36.8 C), temperature source Oral, resp. rate 20, height $RemoveBe'5\' 10"'[id number]$  (1.778 m), weight 77.111 kg (170 lb), SpO2 100 %.Body mass index is 24.39 kg/(m^2).  General Appearance: Casual  Eye Contact:: Good  Speech: Normal Rate  Volume: Normal  Mood: Depressed, low level  Affect: Congruent  Thought Process: Coherent  Orientation: Full (Time, Place, and Person)  Thought Content: WDL  Suicidal Thoughts: No  Homicidal Thoughts: No  Memory: Immediate; Good Recent; Good Remote; Good  Judgement: Good  Insight: Fair  Psychomotor Activity: Normal  Concentration: Good  Recall: Good  Fund of Knowledge:Fair  Language: Good  Akathisia: No  Handed: Right  AIMS (if indicated):    Assets: Housing Leisure Time Physical Health Resilience Social Support  ADL's: Intact  Cognition: WNL  Sleep: Number of Hours: 4.5    Treatment Plan Summary: Daily contact with patient to assess and evaluate symptoms and progress in treatment, Medication management and Plan Substance induced mood disorder -Stabilized -Resources provided  Observation Level/Precautions: Detox 15 minute checks  Laboratory: completed, reviewed, stable  Psychotherapy: Individual therapy  Medications: Wellbutrin, vistaril  Consultations: None  Discharge Concerns:  None  Estimated LOS: Less than 24 hours  Other:     Waylan Boga, PMH-NP 9/28/201610:16 AM

## 2014-10-05 ENCOUNTER — Encounter: Payer: Self-pay | Admitting: Emergency Medicine

## 2014-10-05 ENCOUNTER — Emergency Department
Admission: EM | Admit: 2014-10-05 | Discharge: 2014-10-05 | Disposition: A | Discharge status: Other Acute Inpatient Hospital | Payer: Self-pay | Attending: Emergency Medicine | Admitting: Emergency Medicine

## 2014-10-05 ENCOUNTER — Inpatient Hospital Stay
Admission: EM | Admit: 2014-10-05 | Discharge: 2014-10-09 | DRG: 885 | Disposition: A | Discharge status: Home / Self Care | Payer: No Typology Code available for payment source | Source: Intra-hospital | Attending: Psychiatry | Admitting: Psychiatry

## 2014-10-05 ENCOUNTER — Encounter: Payer: Self-pay | Admitting: *Deleted

## 2014-10-05 DIAGNOSIS — F333 Major depressive disorder, recurrent, severe with psychotic symptoms: Secondary | ICD-10-CM | POA: Diagnosis present

## 2014-10-05 DIAGNOSIS — F411 Generalized anxiety disorder: Secondary | ICD-10-CM | POA: Diagnosis present

## 2014-10-05 DIAGNOSIS — J449 Chronic obstructive pulmonary disease, unspecified: Secondary | ICD-10-CM | POA: Diagnosis present

## 2014-10-05 DIAGNOSIS — F1420 Cocaine dependence, uncomplicated: Secondary | ICD-10-CM | POA: Diagnosis present

## 2014-10-05 DIAGNOSIS — Z833 Family history of diabetes mellitus: Secondary | ICD-10-CM

## 2014-10-05 DIAGNOSIS — Z9119 Patient's noncompliance with other medical treatment and regimen: Secondary | ICD-10-CM

## 2014-10-05 DIAGNOSIS — Z915 Personal history of self-harm: Secondary | ICD-10-CM

## 2014-10-05 DIAGNOSIS — Z818 Family history of other mental and behavioral disorders: Secondary | ICD-10-CM | POA: Diagnosis not present

## 2014-10-05 DIAGNOSIS — F1020 Alcohol dependence, uncomplicated: Secondary | ICD-10-CM | POA: Diagnosis present

## 2014-10-05 DIAGNOSIS — F332 Major depressive disorder, recurrent severe without psychotic features: Principal | ICD-10-CM | POA: Diagnosis present

## 2014-10-05 DIAGNOSIS — Z8249 Family history of ischemic heart disease and other diseases of the circulatory system: Secondary | ICD-10-CM | POA: Diagnosis not present

## 2014-10-05 DIAGNOSIS — F17200 Nicotine dependence, unspecified, uncomplicated: Secondary | ICD-10-CM | POA: Diagnosis present

## 2014-10-05 DIAGNOSIS — Z79899 Other long term (current) drug therapy: Secondary | ICD-10-CM | POA: Insufficient documentation

## 2014-10-05 DIAGNOSIS — F32A Depression, unspecified: Secondary | ICD-10-CM

## 2014-10-05 DIAGNOSIS — R45851 Suicidal ideations: Secondary | ICD-10-CM | POA: Diagnosis present

## 2014-10-05 DIAGNOSIS — E781 Pure hyperglyceridemia: Secondary | ICD-10-CM | POA: Diagnosis present

## 2014-10-05 DIAGNOSIS — F1410 Cocaine abuse, uncomplicated: Secondary | ICD-10-CM | POA: Insufficient documentation

## 2014-10-05 DIAGNOSIS — F1994 Other psychoactive substance use, unspecified with psychoactive substance-induced mood disorder: Secondary | ICD-10-CM | POA: Diagnosis present

## 2014-10-05 DIAGNOSIS — G4700 Insomnia, unspecified: Secondary | ICD-10-CM | POA: Diagnosis present

## 2014-10-05 DIAGNOSIS — Z720 Tobacco use: Secondary | ICD-10-CM | POA: Diagnosis present

## 2014-10-05 DIAGNOSIS — K219 Gastro-esophageal reflux disease without esophagitis: Secondary | ICD-10-CM | POA: Diagnosis present

## 2014-10-05 DIAGNOSIS — Z9049 Acquired absence of other specified parts of digestive tract: Secondary | ICD-10-CM

## 2014-10-05 DIAGNOSIS — Z590 Homelessness: Secondary | ICD-10-CM | POA: Diagnosis not present

## 2014-10-05 DIAGNOSIS — F1010 Alcohol abuse, uncomplicated: Secondary | ICD-10-CM | POA: Insufficient documentation

## 2014-10-05 DIAGNOSIS — F1290 Cannabis use, unspecified, uncomplicated: Secondary | ICD-10-CM | POA: Diagnosis present

## 2014-10-05 DIAGNOSIS — F329 Major depressive disorder, single episode, unspecified: Secondary | ICD-10-CM | POA: Insufficient documentation

## 2014-10-05 LAB — COMPREHENSIVE METABOLIC PANEL
ALT: 39 U/L (ref 17–63)
AST: 37 U/L (ref 15–41)
Albumin: 4.6 g/dL (ref 3.5–5.0)
Alkaline Phosphatase: 60 U/L (ref 38–126)
Anion gap: 7 (ref 5–15)
BUN: 15 mg/dL (ref 6–20)
CO2: 26 mmol/L (ref 22–32)
Calcium: 9.1 mg/dL (ref 8.9–10.3)
Chloride: 103 mmol/L (ref 101–111)
Creatinine, Ser: 0.98 mg/dL (ref 0.61–1.24)
GFR calc Af Amer: >60 mL/min (ref >60)
GFR calc non Af Amer: >60 mL/min (ref >60)
Glucose, Bld: 149 mg/dL — ABNORMAL HIGH (ref 65–99)
Potassium: 3.6 mmol/L (ref 3.5–5.1)
Sodium: 136 mmol/L (ref 135–145)
Total Bilirubin: 1.9 mg/dL — ABNORMAL HIGH (ref 0.3–1.2)
Total Protein: 7.8 g/dL (ref 6.5–8.1)

## 2014-10-05 LAB — CBC
HCT: 47.3 % (ref 40.0–52.0)
Hemoglobin: 16 g/dL (ref 13.0–18.0)
MCH: 29.7 pg (ref 26.0–34.0)
MCHC: 33.8 g/dL (ref 32.0–36.0)
MCV: 87.7 fL (ref 80.0–100.0)
Platelets: 313 10*3/uL (ref 150–440)
RBC: 5.4 MIL/uL (ref 4.40–5.90)
RDW: 13.8 % (ref 11.5–14.5)
WBC: 8.2 10*3/uL (ref 3.8–10.6)

## 2014-10-05 LAB — ETHANOL: Alcohol, Ethyl (B): <5 mg/dL (ref <5)

## 2014-10-05 LAB — ACETAMINOPHEN LEVEL: Acetaminophen (Tylenol), Serum: <10 ug/mL — ABNORMAL LOW (ref 10–30)

## 2014-10-05 LAB — SALICYLATE LEVEL: Salicylate Lvl: <4 mg/dL (ref 2.8–30.0)

## 2014-10-05 MED ORDER — BUPROPION HCL ER (XL) 150 MG PO TB24
150.0000 mg | ORAL_TABLET | Freq: Every day | ORAL | Status: DC
  Administered 2014-10-05: 150 mg via ORAL
  Filled 2014-10-05: qty 1

## 2014-10-05 MED ORDER — LORAZEPAM 2 MG/ML IJ SOLN
0.0000 mg | Freq: Four times a day (QID) | INTRAMUSCULAR | Status: DC
Start: 2014-10-05 — End: 2014-10-05

## 2014-10-05 MED ORDER — MAGNESIUM HYDROXIDE 400 MG/5ML PO SUSP: 30.0000 mL | Freq: Every day | ORAL | Status: DC | PRN

## 2014-10-05 MED ORDER — TRAZODONE HCL 50 MG PO TABS
50.0000 mg | ORAL_TABLET | Freq: Every evening | ORAL | Status: DC | PRN
  Filled 2014-10-05: qty 1

## 2014-10-05 MED ORDER — LORAZEPAM 2 MG/ML IJ SOLN: 0.0000 mg | Freq: Two times a day (BID) | INTRAMUSCULAR | Status: DC

## 2014-10-05 MED ORDER — VITAMIN B-1 100 MG PO TABS
100.0000 mg | ORAL_TABLET | Freq: Every day | ORAL | Status: DC
  Administered 2014-10-05: 100 mg via ORAL
  Filled 2014-10-05: qty 1

## 2014-10-05 MED ORDER — HYDROXYZINE HCL 50 MG/ML IM SOLN
50.0000 mg | Freq: Four times a day (QID) | INTRAMUSCULAR | Status: DC | PRN
  Filled 2014-10-05: qty 1

## 2014-10-05 MED ORDER — LORAZEPAM 2 MG PO TABS: 0.0000 mg | ORAL_TABLET | Freq: Four times a day (QID) | ORAL | Status: DC

## 2014-10-05 MED ORDER — GABAPENTIN 600 MG PO TABS
300.0000 mg | ORAL_TABLET | Freq: Three times a day (TID) | ORAL | Status: DC
  Administered 2014-10-05: 300 mg via ORAL
  Filled 2014-10-05: qty 1

## 2014-10-05 MED ORDER — ALUM & MAG HYDROXIDE-SIMETH 200-200-20 MG/5ML PO SUSP: 30.0000 mL | ORAL | Status: DC | PRN

## 2014-10-05 MED ORDER — LORAZEPAM 2 MG PO TABS: 0.0000 mg | ORAL_TABLET | Freq: Two times a day (BID) | ORAL | Status: DC

## 2014-10-05 MED ORDER — THIAMINE HCL 100 MG/ML IJ SOLN: 100.0000 mg | Freq: Every day | INTRAMUSCULAR | Status: DC

## 2014-10-05 MED ORDER — ACETAMINOPHEN 325 MG PO TABS
650.0000 mg | ORAL_TABLET | Freq: Four times a day (QID) | ORAL | Status: DC | PRN
  Administered 2014-10-06: 650 mg via ORAL
  Filled 2014-10-05: qty 2

## 2014-10-05 NOTE — BH Assessment (Signed)
Assessment Note  Franklin [name] is an 44 y.o. male who presents to the ER via his sister due to voicing SI with a plan of hanging himself and reporting he was having A/H. Patient states, he has an going history of psychosis. Patient states, he doesn't feel safe at this time and unsure of what he may he was at home. Patient was recently at [facility] ER and transferred to [facility], discharged on 10/01/2014. Patient states, he was unable to follow up with his outpatient appointment due to a lack of transportation. Patient lives in [place], [place] with his mother. Due to having no insurance, he qualifies for [employer] and [employer] is the agency with the contract for [facility], . Patient is unable to get to [facility], for his ongoing medication management.  Per the report of the patient, he went approximately a month without his medications. He received it while at [facility]. He was their for approximately 24 hours.  He admits to Alcohol, Cocaine & THC use. He has insight that his ongoing use increase, his mental health symptoms. Prior to moving back home with his mother, he lived in [place] for two years. During that time, he was living in an [place] house and taking his medications, as prescribed. He's been with his mother for approximately 4 months.  Patient currently denies HI and V/H. He also denies any current involvement with the legal system.  He endorses SI with plan and intent.  Patient reports of having no history of violence and aggressive behaviors.   Diagnosis: Major Depression, Recurrent Severe, Substance Induced Mood Disorder &Unspecified schizophrenia spectrum and other psychotic disorder  Past Medical History:  Past Medical History  Diagnosis Date  . Depression   . Anxiety   . Bipolar 1 disorder (HCC)   . No pertinent past medical history   . Schizophrenia [facility])     Past Surgical History  Procedure Laterality Date  . No past surgeries      Family History:  Family  History  Problem Relation Age of Onset  . Hypertension Other   . Diabetes Other     Social History:  reports that he has been smoking Cigarettes.  He has been smoking about 1.00 pack per day. He has never used smokeless tobacco. He reports that he drinks about 7.2 oz of alcohol per week. He reports that he uses illicit drugs ("Crack" cocaine and Cocaine).  Additional Social History:  Alcohol / Drug Use Pain Medications: Patient denies use Prescriptions: Patient denies use Over the Counter: Patient denies use History of alcohol / drug use?: Yes Longest period of sobriety (when/how long):  (None reported) Negative Consequences of Use: Personal relationships Withdrawal Symptoms: Sweats, Nausea / Vomiting, Weakness Substance #1 Name of Substance 1: Alcohol  1 - Age of First Use: 6 1 - Amount (size/oz): 12, 12oz beers 1 - Frequency: Daily 1 - Duration: 2 months 1 - Last Use / Amount: 10/04/2014 Substance #2 Name of Substance 2: Crack Cocaine 2 - Age of First Use: 22 2 - Amount (size/oz): $50-$100 2 - Frequency: 2x a week 2 - Duration: for  a month 2 - Last Use / Amount: 09/30/14 - $100 Substance #3 Name of Substance 3: THC 3 - Age of First Use: 15 3 - Amount (size/oz): "A joint" 3 - Frequency: 2x Month 3 - Duration: Several Years 3 - Last Use / Amount: 09/23/2014  CIWA: CIWA-Ar BP: (!) 157/83 mmHg Pulse Rate: 80 Nausea and Vomiting: no nausea  and no vomiting Tactile Disturbances: none Tremor: no tremor Auditory Disturbances: not present Paroxysmal Sweats: no sweat visible Visual Disturbances: not present Anxiety: no anxiety, at ease Headache, Fullness in Head: none present Agitation: normal activity Orientation and Clouding of Sensorium: oriented and can do serial additions CIWA-Ar Total: 0 COWS:    Allergies: No Known Allergies  Home Medications:  (Not in a hospital admission)  OB/GYN Status:  No LMP for male patient.  General Assessment Data Location of  Assessment: [facility] TTS Assessment: In system Is this a Tele or Face-to-Face Assessment?: Face-to-Face Is this an Initial Assessment or a Re-assessment for this encounter?: Initial Assessment Marital status: Single Maiden name: n/a Is patient pregnant?: No Pregnancy Status: No Living Arrangements: Parent Can pt return to current living arrangement?: Yes Admission Status: Voluntary Is patient capable of signing voluntary admission?: Yes Referral Source: Self/Family/Friend Insurance type: IPRS  Medical Screening Exam [facility] Walk-in ONLY) Medical Exam completed: Yes  Crisis Care Plan Living Arrangements: Parent Name of Psychiatrist: Transport planner Name of Therapist: [facility]  Education Status Is patient currently in school?: No Current Grade: n/a Highest grade of school patient has completed: Some College Name of school: n/a Contact person: n/a  Risk to self with the past 6 months Suicidal Ideation: Yes-Currently Present Has patient been a risk to self within the past 6 months prior to admission? : Yes Suicidal Intent: No-Not Currently/Within Last 6 Months Has patient had any suicidal intent within the past 6 months prior to admission? : Yes Is patient at risk for suicide?: Yes Suicidal Plan?: Yes-Currently Present Has patient had any suicidal plan within the past 6 months prior to admission? : Yes Specify Current Suicidal Plan: Hang self Access to Means: Yes Specify Access to Suicidal Means: Have access to rope What has been your use of drugs/alcohol within the last 12 months?: Alcohol, Crack Cocaine & THC Previous Attempts/Gestures: Yes How many times?: 1 ((2012)) Other Self Harm Risks: None Reported Triggers for Past Attempts: Hallucinations, Other (Comment) (Off of medictions) Intentional Self Injurious Behavior: None Family Suicide History: Yes (grandfather committed suicide) Recent stressful life event(s): Financial Problems, Job Loss, Trauma (Comment) Persecutory  voices/beliefs?: Yes Depression: Yes Depression Symptoms: Insomnia, Tearfulness, Isolating, Fatigue, Guilt, Loss of interest in usual pleasures, Feeling worthless/self pity Substance abuse history and/or treatment for substance abuse?: Yes (Alcohol, Crack Cocaine & THC) Suicide prevention information given to non-admitted patients: Yes  Risk to Others within the past 6 months Homicidal Ideation: No Does patient have any lifetime risk of violence toward others beyond the six months prior to admission? : No Thoughts of Harm to Others: No Current Homicidal Intent: No Current Homicidal Plan: No Access to Homicidal Means: No Identified Victim: None Reported History of harm to others?: No Assessment of Violence: None Noted Violent Behavior Description: None Reported Does patient have access to weapons?: No Criminal Charges Pending?: No Describe Pending Criminal Charges: None Reported Does patient have a court date: No Court Date:  (None Reported) Is patient on probation?: No  Psychosis Hallucinations: Auditory Delusions: Persecutory  Mental Status Report Appearance/Hygiene: In scrubs, Unremarkable, In hospital gown Eye Contact: Fair Motor Activity: Freedom of movement, Unremarkable Speech: Logical/coherent, Soft Level of Consciousness: Alert Mood: Depressed, Anxious, Suspicious, Helpless, Sad, Pleasant Affect: Appropriate to circumstance, Anxious, Sad Anxiety Level: Moderate Thought Processes: Coherent, Relevant Judgement: Impaired Orientation: Person, Place, Time, Situation, Appropriate for developmental age Obsessive Compulsive Thoughts/Behaviors: None  Cognitive Functioning Concentration: Normal Memory: Recent Intact, Remote Intact IQ: Average Insight: Fair Impulse Control:  Poor Appetite: Fair Weight Loss: 0 Weight Gain: 0 Sleep: No Change Total Hours of Sleep: 8 Vegetative Symptoms: None  ADLScreening [facility] Assessment Services) Patient's cognitive ability adequate  to safely complete daily activities?: Yes Patient able to express need for assistance with ADLs?: Yes Independently performs ADLs?: Yes (appropriate for developmental age)  Prior Inpatient Therapy Prior Inpatient Therapy: Yes Prior Therapy Dates: [facility](2011-2016), other years Prior Therapy Facilty/Provider(s): [facility], [facility], [facility] in [place], [facility] in [place], [place], [place] Reason for Treatment: MDD, substance abuse  Prior Outpatient Therapy Prior Outpatient Therapy: Yes Prior Therapy Dates: Currently Prior Therapy Facilty/Provider(s): [facility] Reason for Treatment: med management Does patient have an ACCT team?: No Does patient have Intensive In-House Services?  : No Does patient have [facility] services? : Yes Does patient have P4[facility] services?: No  ADL Screening (condition at time of admission) Patient's cognitive ability adequate to safely complete daily activities?: Yes Patient able to express need for assistance with ADLs?: Yes Independently performs ADLs?: Yes (appropriate for developmental age)       Abuse/Neglect Assessment (Assessment to be complete while patient is alone) Physical Abuse: Denies Verbal Abuse: Denies Sexual Abuse: Denies Exploitation of patient/patient's resources: Denies Self-Neglect: Denies Values / Beliefs Cultural Requests During Hospitalization: None Spiritual Requests During Hospitalization: None Consults Spiritual Care Consult Needed: No Social Work Consult Needed: No Merchant navy officer (For Healthcare) Does patient have an advance directive?: No Would patient like information on creating an advanced directive?: No - patient declined information    Additional Information 1:1 In Past 12 Months?: No CIRT Risk: No Elopement Risk: No Does patient have medical clearance?: Yes  Child/Adolescent Assessment Running Away Risk: Denies (Patient is an adult)  Disposition:  Disposition Initial Assessment Completed for this Encounter:  Yes Disposition of Patient: Inpatient treatment program Type of inpatient treatment program: Adult Type of outpatient treatment: Adult  On Site Evaluation by:   Reviewed with Physician:    [name], MS, LCAS, LPC, NCC, CCSI 10/05/2014 2:31 PM

## 2014-10-05 NOTE — Consult Note (Signed)
[facility] Consult   Reason for Consult:  Follow up Referring Physician:  Er Patient Identification: Franklin [name] MRN:  [id number] Principal Diagnosis: MDD recurrent with SI and alcohol and crack abuse in relapse Diagnosis:   Patient Active Problem List   Diagnosis Date Noted  . Cocaine abuse [F14.10] 10/02/2014  . Major depressive disorder, recurrent, severe without psychotic features [F33.2]   . Alcohol use disorder, severe, dependence [F10.20] 06/18/2014  . Alcohol dependence with alcohol-induced disorder [F10.29] 05/22/2013  . Major depression [F32.9] 05/22/2013  . Substance induced mood disorder [F19.94] 03/14/2013  . Polysubstance abuse [F19.10] 04/08/2012    Total Time spent with patient: 1 hour  Subjective:   Franklin [name] is a 44 y.o. male patient admitted with a long H/O depression and Poly substance abuse - alcohol and crack.  HPI:  Pt reports that he was non complaint with his meds for depression for no reason and started drinking 12 beers a day and smoking crack after being sober for 2 yrs for no reason. Started having suicidal thoughts and wanting to hang himself and called sister who brought him here for help.  Past Psychiatric History: H/O Inpt to psychiatry about 3 times so far for similar problems of depression and substance abuse.  Risk to Self: Is patient at risk for suicide?: Yes Risk to Others:   Prior Inpatient Therapy:   Prior Outpatient Therapy:    Past Medical History:  Past Medical History  Diagnosis Date  . Depression   . Anxiety   . Bipolar 1 disorder ([place])   . No pertinent past medical history   . Schizophrenia [facility])     Past Surgical History  Procedure Laterality Date  . No past surgeries     Family History:  Family History  Problem Relation Age of Onset  . Hypertension Other   . Diabetes Other    Family Psychiatric  History: None known Social History:  History  Alcohol Use  . 7.2 oz/week  . 12 Cans of beer per  week    Comment: heavy 12 pk/day- as of 12/22/13     History  Drug Use  . Yes  . Special: "Crack" cocaine, Cocaine    Comment: crack    Social History   Social History  . Marital Status: Single    Spouse Name: N/A  . Number of Children: N/A  . Years of Education: N/A   Social History Main Topics  . Smoking status: Current Every Day Smoker -- 1.00 packs/day    Types: Cigarettes  . Smokeless tobacco: Never Used  . Alcohol Use: 7.2 oz/week    12 Cans of beer per week     Comment: heavy 12 pk/day- as of 12/22/13  . Drug Use: Yes    Special: "Crack" cocaine, Cocaine     Comment: crack  . Sexual Activity: Yes   Other Topics Concern  . None   Social History Narrative   Additional Social History:                          Allergies:  No Known Allergies  Labs:  Results for orders placed or performed during the hospital encounter of 10/05/14 (from the past 48 hour(s))  Ethanol (ETOH)     Status: None   Collection Time: 10/05/14  9:20 AM  Result Value Ref Range   Alcohol, Ethyl (B) <5 <5 mg/dL    Comment:        LOWEST  DETECTABLE LIMIT FOR SERUM ALCOHOL IS 5 mg/dL FOR MEDICAL PURPOSES ONLY   Salicylate level     Status: None   Collection Time: 10/05/14  9:20 AM  Result Value Ref Range   Salicylate Lvl <3.9 2.8 - 30.0 mg/dL  Acetaminophen level     Status: Abnormal   Collection Time: 10/05/14  9:20 AM  Result Value Ref Range   Acetaminophen (Tylenol), Serum <10 (L) 10 - 30 ug/mL    Comment:        THERAPEUTIC CONCENTRATIONS VARY SIGNIFICANTLY. A RANGE OF 10-30 ug/mL MAY BE AN EFFECTIVE CONCENTRATION FOR MANY PATIENTS. HOWEVER, SOME ARE BEST TREATED AT CONCENTRATIONS OUTSIDE THIS RANGE. ACETAMINOPHEN CONCENTRATIONS >150 ug/mL AT 4 HOURS AFTER INGESTION AND >50 ug/mL AT 12 HOURS AFTER INGESTION ARE OFTEN ASSOCIATED WITH TOXIC REACTIONS.   CBC     Status: None   Collection Time: 10/05/14  9:20 AM  Result Value Ref Range   WBC 8.2 3.8 - 10.6 K/uL    RBC 5.40 4.40 - 5.90 MIL/uL   Hemoglobin 16.0 13.0 - 18.0 g/dL   HCT 47.3 40.0 - 52.0 %   MCV 87.7 80.0 - 100.0 fL   MCH 29.7 26.0 - 34.0 pg   MCHC 33.8 32.0 - 36.0 g/dL   RDW 13.8 11.5 - 14.5 %   Platelets 313 150 - 440 K/uL  Comprehensive metabolic panel     Status: Abnormal   Collection Time: 10/05/14  9:20 AM  Result Value Ref Range   Sodium 136 135 - 145 mmol/L   Potassium 3.6 3.5 - 5.1 mmol/L   Chloride 103 101 - 111 mmol/L   CO2 26 22 - 32 mmol/L   Glucose, Bld 149 (H) 65 - 99 mg/dL   BUN 15 6 - 20 mg/dL   Creatinine, Ser 0.98 0.61 - 1.24 mg/dL   Calcium 9.1 8.9 - 10.3 mg/dL   Total Protein 7.8 6.5 - 8.1 g/dL   Albumin 4.6 3.5 - 5.0 g/dL   AST 37 15 - 41 U/L   ALT 39 17 - 63 U/L   Alkaline Phosphatase 60 38 - 126 U/L   Total Bilirubin 1.9 (H) 0.3 - 1.2 mg/dL   GFR calc non Af Amer >60 >60 mL/min   GFR calc Af Amer >60 >60 mL/min    Comment: (NOTE) The eGFR has been calculated using the CKD EPI equation. This calculation has not been validated in all clinical situations. eGFR's persistently <60 mL/min signify possible Chronic Kidney Disease.    Anion gap 7 5 - 15    Current Facility-Administered Medications  Medication Dose Route Frequency Provider Last Rate Last Dose  . LORazepam (ATIVAN) injection 0-4 mg  0-4 mg Intravenous 4 times per day Schuyler Amor, MD      . LORazepam (ATIVAN) injection 0-4 mg  0-4 mg Intravenous Q12H Schuyler Amor, MD   0 mg at 10/05/14 1207  . LORazepam (ATIVAN) tablet 0-4 mg  0-4 mg Oral 4 times per day Schuyler Amor, MD      . LORazepam (ATIVAN) tablet 0-4 mg  0-4 mg Oral Q12H Schuyler Amor, MD   0 mg at 10/05/14 1206  . thiamine (B-1) injection 100 mg  100 mg Intravenous Daily Schuyler Amor, MD   100 mg at 10/05/14 1206  . thiamine (VITAMIN B-1) tablet 100 mg  100 mg Oral Daily Schuyler Amor, MD   100 mg at 10/05/14 1205   Current Outpatient Prescriptions  Medication Sig  Dispense Refill  . acetaminophen (TYLENOL) 325 MG  tablet Take 650 mg by mouth every 6 (six) hours as needed for mild pain.    Marland Kitchen buPROPion (WELLBUTRIN XL) 150 MG 24 hr tablet Take 1 tablet (150 mg total) by mouth daily. 30 tablet 0  . gabapentin (NEURONTIN) 100 MG capsule Take 2 capsules (200 mg total) by mouth 3 (three) times daily. 180 capsule 1  . hydrOXYzine (ATARAX/VISTARIL) 25 MG tablet Take 1 tablet (25 mg total) by mouth 3 (three) times daily as needed for anxiety. 40 tablet 0  . Multiple Vitamin (MULTIVITAMIN WITH MINERALS) TABS tablet Take 1 tablet by mouth daily. 30 tablet 0    Musculoskeletal: Strength & Muscle Tone: within normal limits Gait & Station: normal Patient leans: N/A  Psychiatric Specialty Exam: Review of Systems  All other systems reviewed and are negative.   Blood pressure 157/83, pulse 80, temperature 98.5 F (36.9 C), temperature source Oral, resp. rate 18, height _0  (1.778 m), weight 165 lb (74.844 kg), SpO2 95 %.Body mass index is 23.68 kg/(m^2).  General Appearance: Casual  Eye Contact::  Fair  Speech:  Clear and Coherent  Volume:  Normal  Mood:  Anxious, Depressed, Dysphoric, Hopeless and low and down  Affect:  Constricted  Thought Process:  Circumstantial  Orientation:  Full (Time, Place, and Person)  Thought Content:  Rumination  Suicidal Thoughts:  Yes.  without intent/plan  Homicidal Thoughts:  No  Memory:  Immediate;   Fair Recent;   Fair Remote;   Fair adequate  Judgement:  Fair  Insight:  Fair  Psychomotor Activity:  Normal  Concentration:  Fair  Recall:  [employer] of Knowledge:Fair  Language: Fair  Akathisia:  No  Handed:  Right  AIMS (if indicated):     Assets:  Communication Skills Housing Social Support Others:  sister  ADL's:  Intact  Cognition: WNL  Sleep:      Treatment Plan Summary: Plan npt to [facility] after being medically cleared and stable and bed is available  Disposition: Recommend psychiatric Inpatient admission when medically cleared.  Camie Patience  K 10/05/2014 1:47 PM

## 2014-10-05 NOTE — ED Notes (Signed)
Per dr Mathews Argyle pt changed from 1v1 sitter to q 15 min checks.

## 2014-10-05 NOTE — ED Notes (Signed)
BEHAVIORAL HEALTH ROUNDING Patient sleeping: No. Patient alert and oriented: yes Behavior appropriate: Yes.  ;  Nutrition and fluids offered: Yes  Toileting and hygiene offered: Yes  Sitter present: yes Law enforcement present: Yes  

## 2014-10-05 NOTE — ED Notes (Signed)
Pt states that he is here for alcohol and crack detox. Pt states that he is having auditory hallucination that are just noises and not distinguishable. Pt states that he has not been taking his psychiatric medications. Pt states that he is also having suicidal ideation with a plan to hang himself. Pt calm and cooperative with staff at this time.

## 2014-10-05 NOTE — ED Notes (Signed)
BEHAVIORAL HEALTH ROUNDING Patient sleeping: No. Patient alert and oriented: yes Behavior appropriate: Yes.  ; If no, describe: cooperative Nutrition and fluids offered: Yes  Toileting and hygiene offered: Yes  Sitter present: yes Law enforcement present: Yes  

## 2014-10-05 NOTE — ED Notes (Signed)
BEHAVIORAL HEALTH ROUNDING Patient sleeping: No. Patient alert and oriented: yes Behavior appropriate: Yes.  ; If no, describe:  Nutrition and fluids offered: Yes  Toileting and hygiene offered: Yes  Sitter present: yes Law enforcement present: Yes  

## 2014-10-05 NOTE — ED Notes (Signed)
BEHAVIORAL HEALTH ROUNDING Patient sleeping: No. Patient alert and oriented: yes Behavior appropriate: Yes.  ; If no, describe: calm, cooperative, friendly to staff Nutrition and fluids offered: Yes  Toileting and hygiene offered: Yes  Sitter present: yes Law enforcement present: Yes

## 2014-10-05 NOTE — ED Notes (Signed)

## 2014-10-05 NOTE — ED Provider Notes (Signed)
[facility] Emergency Department Provider Note  ____________________________________________   I have reviewed the triage vital signs and the nursing notes.   HISTORY  Chief Complaint Suicidal and Alcohol Problem    HPI Geno Sydnor is a 44 y.o. male with a history of what he describes as schizophrenia, as well as alcohol and polysubstance abuse including cocaine. Has had prior suicide attempt. Has a prior alcohol withdrawal. Presents today complaining of suicidal thoughts and desire for rehabilitation. Has been taking cocaine as well as alcohol. Last alcohol was yesterday. No detox symptoms at this time. He has not actually tried to hurt himself this time. He has not taken an overdose. He is however not taking his psychiatric medications. He has chronic auditory hallucinations are getting worse. He cannot hear any command hallucinations. He denies HI.  Past Medical History  Diagnosis Date  . Depression   . Anxiety   . Bipolar 1 disorder (HCC)   . No pertinent past medical history   . Schizophrenia [facility])     Patient Active Problem List   Diagnosis Date Noted  . Cocaine abuse 10/02/2014  . Major depressive disorder, recurrent, severe without psychotic features   . Alcohol use disorder, severe, dependence 06/18/2014  . Alcohol dependence with alcohol-induced disorder 05/22/2013  . Major depression 05/22/2013  . Substance induced mood disorder 03/14/2013  . Polysubstance abuse 04/08/2012    Past Surgical History  Procedure Laterality Date  . No past surgeries      Current Outpatient Rx  Name  Route  Sig  Dispense  Refill  . acetaminophen (TYLENOL) 325 MG tablet   Oral   Take 650 mg by mouth every 6 (six) hours as needed for mild pain.         Marland Kitchen buPROPion (WELLBUTRIN XL) 150 MG 24 hr tablet   Oral   Take 1 tablet (150 mg total) by mouth daily.   30 tablet   0   . gabapentin (NEURONTIN) 100 MG capsule   Oral   Take 2 capsules (200 mg  total) by mouth 3 (three) times daily.   180 capsule   1   . hydrOXYzine (ATARAX/VISTARIL) 25 MG tablet   Oral   Take 1 tablet (25 mg total) by mouth 3 (three) times daily as needed for anxiety.   40 tablet   0   . Multiple Vitamin (MULTIVITAMIN WITH MINERALS) TABS tablet   Oral   Take 1 tablet by mouth daily.   30 tablet   0     May purchase over the counter     Allergies Review of patient's allergies indicates no known allergies.  Family History  Problem Relation Age of Onset  . Hypertension Other   . Diabetes Other     Social History Social History  Substance Use Topics  . Smoking status: Current Every Day Smoker -- 1.00 packs/day    Types: Cigarettes  . Smokeless tobacco: Never Used  . Alcohol Use: 7.2 oz/week    12 Cans of beer per week     Comment: heavy 12 pk/day- as of 12/22/13    Review of Systems Constitutional: No fever/chills Eyes: No visual changes. ENT: No sore throat. No stiff neck no neck pain Cardiovascular: Denies chest pain. Respiratory: Denies shortness of breath. Gastrointestinal:   no vomiting.  No diarrhea.  No constipation. Genitourinary: Negative for dysuria. Musculoskeletal: Negative lower extremity swelling Skin: Negative for rash. Neurological: Negative for headaches, focal weakness or numbness. 10-point ROS otherwise negative.  ____________________________________________   PHYSICAL EXAM:  VITAL SIGNS: ED Triage Vitals  Enc Vitals Group     BP 10/05/14 0913 145/84 mmHg     Pulse Rate 10/05/14 0913 91     Resp 10/05/14 0913 18     Temp 10/05/14 0913 98.5 F (36.9 C)     Temp Source 10/05/14 0913 Oral     SpO2 10/05/14 0913 95 %     Weight 10/05/14 0913 165 lb (74.844 kg)     Height 10/05/14 0913  (1.778 m)     Head Cir --      Peak Flow --      Pain Score --      Pain Loc --      Pain Edu? --      Excl. in GC? --     Constitutional: Alert and oriented. Well appearing and in no acute distress. Eyes:  Conjunctivae are normal. PERRL. EOMI. Head: Atraumatic. Nose: No congestion/rhinnorhea. Mouth/Throat: Mucous membranes are moist.  Oropharynx non-erythematous. Neck: No stridor.   Nontender with no meningismus Cardiovascular: Normal rate, regular rhythm. Grossly normal heart sounds.  Good peripheral circulation. Respiratory: Normal respiratory effort.  No retractions. Lungs CTAB. Gastrointestinal: Soft and nontender. No distention. No guarding no rebound Back:  There is no focal tenderness or step off there is no midline tenderness there are no lesions noted. there is no CVA tenderness  Musculoskeletal: No lower extremity tenderness. No joint effusions, no DVT signs strong distal pulses no edema Neurologic:  Normal speech and language. No gross focal neurologic deficits are appreciated.  Skin:  Skin is warm, dry and intact. No rash noted. Psychiatric: Mood and affect are somewhat flat. Speech and behavior are normal.  ____________________________________________   LABS (all labs ordered are listed, but only abnormal results are displayed)  Labs Reviewed  ACETAMINOPHEN LEVEL - Abnormal; Notable for the following:    Acetaminophen (Tylenol), Serum <10 (*)    All other components within normal limits  COMPREHENSIVE METABOLIC PANEL - Abnormal; Notable for the following:    Glucose, Bld 149 (*)    Total Bilirubin 1.9 (*)    All other components within normal limits  ETHANOL  SALICYLATE LEVEL  CBC  URINE DRUG SCREEN, QUALITATIVE ([facility] ONLY)   ____________________________________________  EKG   ____________________________________________  RADIOLOGY   ____________________________________________   PROCEDURES  Procedure(s) performed: None  Critical Care performed: None  ____________________________________________   INITIAL IMPRESSION / ASSESSMENT AND PLAN / ED COURSE  Pertinent labs & imaging results that were available during my care of the patient were reviewed by  me and considered in my medical decision making (see chart for details).  Patient here for medical clearance for psychiatric reasons has a history of SI and mental illness with depression, not taking his medications, has thoughts of harming himself, also ongoing substance abuse. Likely require IVC. ____________________________________________   FINAL CLINICAL IMPRESSION(S) / ED DIAGNOSES  Final diagnoses:  None     Jeanmarie Plant, MD 10/05/14 1035

## 2014-10-05 NOTE — Progress Notes (Signed)
Patient admitted to behavioral med unit from ED. Skin assessment completed. No contraband found. Patient states he is here because of problems with "alcohol, drugs, hearing voices and suicidal." States his goal for treatment is to "get regulated on my medicine and find a long-term treatment program." He reports that he was discharged from [facility] on Wednesday. Had prescriptions in his pocket but had not been able to get them filled. States he drinks a 12 pack daily and last drink was yesterday. Also reports smoking about 2 grams of cocaine twice a week. Oriented to unit. Continue to monitor.

## 2014-10-05 NOTE — Tx Team (Signed)
Initial Interdisciplinary Treatment Plan   PATIENT STRESSORS: Financial difficulties Substance abuse   PATIENT STRENGTHS: Wellsite geologist fund of knowledge   PROBLEM LIST: Problem List/Patient Goals Date to be addressed Date deferred Reason deferred Estimated date of resolution  Suicidal thoughts      Substance abuse                                                 DISCHARGE CRITERIA:  Improved stabilization in mood, thinking, and/or behavior Reduction of life-threatening or endangering symptoms to within safe limits  PRELIMINARY DISCHARGE PLAN: Attend aftercare/continuing care group Attend 12-step recovery group  PATIENT/FAMIILY INVOLVEMENT: This treatment plan has been presented to and reviewed with the patient, Franklin [name].  The patient and family have been given the opportunity to ask questions and make suggestions.  Franklin [name], Sarajane Marek 10/05/2014, 7:02 PM

## 2014-10-05 NOTE — Plan of Care (Signed)
Problem: Consults Goal: [facility] Treatment Patient Education Outcome: Progressing Oriented to unit policies and procedures.

## 2014-10-05 NOTE — [facility] Counselor (Signed)
Pt. is to be admitted to [facility] by Dr. Guss Bunde. Attending Physician will be Dr. Jennet Maduro.  Pt. has been assigned to room 320, by [facility] Charge Nurse Cliff P.  Intake Paper Work has been signed and placed on pt. chart. ER staff [facility].; Dr. Lenard Lance, ER MD; Darl Pikes Patient's Nurse & Ellyn Hack Patient Access) have been made aware of the admission.

## 2014-10-05 NOTE — B[facility] Counselor (Signed)
Authorization Request for Psych Inpt. Treatment from [employer] completed and submitted. [id number].

## 2014-10-06 ENCOUNTER — Encounter: Payer: Self-pay | Admitting: Psychiatry

## 2014-10-06 DIAGNOSIS — F17200 Nicotine dependence, unspecified, uncomplicated: Secondary | ICD-10-CM | POA: Diagnosis present

## 2014-10-06 DIAGNOSIS — Z720 Tobacco use: Secondary | ICD-10-CM | POA: Diagnosis present

## 2014-10-06 MED ORDER — QUETIAPINE FUMARATE 25 MG PO TABS
50.0000 mg | ORAL_TABLET | Freq: Every day | ORAL | Status: DC
  Administered 2014-10-06: 50 mg via ORAL
  Filled 2014-10-06: qty 2

## 2014-10-06 MED ORDER — HYDROXYZINE HCL 25 MG PO TABS
25.0000 mg | ORAL_TABLET | Freq: Three times a day (TID) | ORAL | Status: DC | PRN
  Administered 2014-10-06 – 2014-10-08 (×3): 25 mg via ORAL
  Filled 2014-10-06 (×3): qty 1

## 2014-10-06 MED ORDER — THIAMINE HCL 100 MG/ML IJ SOLN
100.0000 mg | Freq: Once | INTRAMUSCULAR | Status: AC
  Administered 2014-10-06: 100 mg via INTRAMUSCULAR
  Filled 2014-10-06: qty 2

## 2014-10-06 MED ORDER — ADULT MULTIVITAMIN W/MINERALS CH
1.0000 | ORAL_TABLET | Freq: Every day | ORAL | Status: DC
  Administered 2014-10-06 – 2014-10-09 (×4): 1 via ORAL
  Filled 2014-10-06 (×4): qty 1

## 2014-10-06 MED ORDER — LORAZEPAM 1 MG PO TABS: 1.0000 mg | ORAL_TABLET | Freq: Four times a day (QID) | ORAL | Status: DC | PRN

## 2014-10-06 MED ORDER — LOPERAMIDE HCL 2 MG PO CAPS: 2.0000 mg | ORAL_CAPSULE | ORAL | Status: DC | PRN

## 2014-10-06 MED ORDER — PANTOPRAZOLE SODIUM 40 MG PO TBEC
40.0000 mg | DELAYED_RELEASE_TABLET | Freq: Every day | ORAL | Status: DC
  Administered 2014-10-06 – 2014-10-09 (×4): 40 mg via ORAL
  Filled 2014-10-06 (×4): qty 1

## 2014-10-06 MED ORDER — BUPROPION HCL ER (XL) 150 MG PO TB24
150.0000 mg | ORAL_TABLET | Freq: Every day | ORAL | Status: DC
  Administered 2014-10-07 – 2014-10-09 (×3): 150 mg via ORAL
  Filled 2014-10-06 (×3): qty 1

## 2014-10-06 MED ORDER — VITAMIN B-1 100 MG PO TABS
100.0000 mg | ORAL_TABLET | Freq: Every day | ORAL | Status: DC
  Administered 2014-10-07 – 2014-10-09 (×3): 100 mg via ORAL
  Filled 2014-10-06 (×3): qty 1

## 2014-10-06 MED ORDER — HALOPERIDOL 2 MG PO TABS: 2.0000 mg | ORAL_TABLET | Freq: Four times a day (QID) | ORAL | Status: DC | PRN

## 2014-10-06 MED ORDER — ONDANSETRON 4 MG PO TBDP
4.0000 mg | ORAL_TABLET | Freq: Four times a day (QID) | ORAL | Status: DC | PRN
  Filled 2014-10-06: qty 1

## 2014-10-06 NOTE — [facility] Group Notes (Signed)
[facility] LCSW Group Therapy  10/06/2014 2:43 PM  Type of Therapy:  Group Therapy  Participation Level:  Active   Participation Quality:  Attentive  Affect:  Depressed   Cognitive:  Alert  Insight:  Improving  Engagement in Therapy:  Improving  Modes of Intervention:  Discussion, Education, Socialization and Support  Summary of Progress/Problems: Pt will identify unhealthy thoughts and how they impact their emotions and behavior. Pt will be encouraged to discuss these thoughts, emotions and behaviors with the group. Pt attended group and stayed the entire time. He discussed thoughts of suicide and feeling hopeless and guilty.   [employer] MSW, LCSWA  10/06/2014, 2:43 PM

## 2014-10-06 NOTE — Plan of Care (Signed)
Problem: Ineffective individual coping Goal: LTG: Patient will report a decrease in negative feelings Outcome: Progressing Patient reports no complaints and  Is interactive in the milieu.

## 2014-10-06 NOTE — Plan of Care (Signed)
Problem: Alteration in mood Goal: LTG-Pt's behavior demonstrates decreased signs of depression (Patient's behavior demonstrates decreased signs of depression to the point the patient is safe to return home and continue treatment in an outpatient setting)  Outcome: Progressing Patient states that he is wanting to interact with staff and other patients to stay positive.
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
## 2014-10-06 NOTE — [facility] Suicide Risk Assessment (Signed)
[facility] Admission Suicide Risk Assessment   Nursing information obtained from:  Patient Demographic factors:  Male, Low socioeconomic status, Unemployed Current Mental Status:  Suicidal ideation indicated by patient, Self-harm thoughts Loss Factors:  Legal issues, Financial problems / change in socioeconomic status Historical Factors:  NA Risk Reduction Factors:  Responsible for children under 45 years of age, Religious beliefs about death, Positive social support Total Time spent with patient: 1 hour Principal Problem:  Substance Induced Mood Disorder    Diagnosis:   Patient Active Problem List   Diagnosis Date Noted  . MDD (major depressive disorder), recurrent episode, severe (HCC) [F33.2] 10/05/2014  . Cocaine abuse [F14.10] 10/02/2014  . 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 Alcohol use disorder, severe, dependence (HCC) [F10.20] 06/18/2014  . Alcohol dependence with alcohol-induced disorder (HCC) [F10.29] 05/22/2013  . Major depression (HCC) [F32.9] 05/22/2013  . Substance induced mood disorder (HCC) [F19.94] 03/14/2013  . Polysubstance abuse [F19.10] 04/08/2012     Continued Clinical Symptoms:  Alcohol Use Disorder Identification Test Final Score (AUDIT): 33 The "Alcohol Use Disorders Identification Test", Guidelines for Use in Primary Care, Second Edition.  World Science writer [facility]). Score between 0-7:  no or low risk or alcohol related problems. Score between 8-15:  moderate risk of alcohol related problems. Score between 16-19:  high risk of alcohol related problems. Score 20 or above:  warrants further diagnostic evaluation for alcohol dependence and treatment.   CLINICAL FACTORS:   Depression:   Hopelessness   Musculoskeletal: Strength & Muscle Tone: within normal limits Gait & Station: normal Patient leans: N/A  Psychiatric Specialty Exam: Physical Exam  Constitutional: He is oriented to person, place,  and time. No distress.  Thin appearing  HENT:  Head: Normocephalic and atraumatic.  Nose: Nose normal.  Mouth/Throat: Oropharynx is clear and moist.  Eyes: Conjunctivae and EOM are normal. Pupils are equal, round, and reactive to light.  Neck: Normal range of motion. Neck supple. No tracheal deviation present. No thyromegaly present.  Cardiovascular: Normal rate, regular rhythm and normal heart sounds.  Exam reveals no gallop and no friction rub.   No murmur heard. Respiratory: Effort normal and breath sounds normal. No respiratory distress. He has no wheezes. He exhibits no tenderness.  GI: Soft. Bowel sounds are normal. He exhibits no distension and no mass. There is no tenderness. There is no rebound.  Musculoskeletal: Normal range of motion. He exhibits no edema or tenderness.  Lymphadenopathy:    He has no cervical adenopathy.  Neurological: He is alert and oriented to person, place, and time. He has normal reflexes. He displays normal reflexes. No cranial nerve deficit. He exhibits normal muscle tone. Coordination normal.  Skin: Skin is warm and dry. No rash noted. He is not diaphoretic. No erythema.    Review of Systems  Constitutional: Negative for fever, chills, weight loss, malaise/fatigue and diaphoresis.  HENT: Negative for congestion, ear discharge, hearing loss, sore throat and tinnitus.   Eyes: Negative for blurred vision, double vision, photophobia and pain.  Respiratory: Negative for cough, hemoptysis, shortness of breath and wheezing.   Cardiovascular: Negative for chest pain, palpitations and leg swelling.  Gastrointestinal: Negative for heartburn, nausea, vomiting, abdominal pain, diarrhea and constipation.  Genitourinary: Negative for dysuria, urgency, frequency and hematuria.  Musculoskeletal: Negative for myalgias, back pain, joint pain and neck pain.  Skin: Negative for itching and rash.  Neurological: Negative for dizziness, tremors, sensory change, focal  weakness, seizures, weakness and headaches.  Endo/Heme/Allergies: Does not bruise/bleed easily.    Blood pressure 151/90, pulse 64, temperature 98.3 F (36.8 C), temperature source Oral, resp. rate 20, height  (1.778 m), weight 72.576 kg (160 lb).Body mass index is 22.96 kg/(m^2).  General Appearance: Casual  Eye Contact::  Good  Speech:  Clear and Coherent  Volume:  Normal  Mood:  Depressed  Affect:  Depressed  Thought Process:  Goal Directed, Linear and Logical  Orientation:  Full (Time, Place, and Person)  Thought Content:  Negative  Suicidal Thoughts:  Yes, passive suicidal thoughts, no intent or plan  Homicidal Thoughts:  No  Memory:  Immediate;   Fair Recent;   Fair Remote;   Fair  Judgement:  Poor  Insight:  Fair  Psychomotor Activity:  Negative  Concentration:  Fair  Recall:  Negative  Fund of Knowledge:Good  Language: Negative  Akathisia:  Negative  Handed:  Right  AIMS (if indicated):     Assets:  Communication Skills Housing  Sleep:  Number of Hours: 7  Cognition: Intact  ADL's:  Intact     COGNITIVE FEATURES THAT CONTRIBUTE TO RISK:  None    SUICIDE RISK:   Mild:  Suicidal ideation of limited frequency, intensity, duration, and specificity.  There are no identifiable plans, no associated intent, mild dysphoria and related symptoms, good self-control (both objective and subjective assessment), few other risk factors, and identifiable protective factors, including available and accessible social support.  PLAN OF CARE:   Diagnosis Substance-induced mood disorder Alcohol use disorder, severe Cocaine use disorder, moderate  Franklin [name] is a 44 year old single African-American male with a history of recurrent depression and polysubstance use as well as a substance-induced mood disorder. It is unclear whether not psychotic symptoms are secondary to substance use or true psychosis associated with depression. The patient was endorsing passive suicidal  thoughts in the context of using cocaine.   Substance-induced mood disorder: The patient wants to remain on Wellbutrin XL 150 mg by mouth daily as he says this helped with him staying clean and sober for 2 years in the past. We'll start Seroquel 50 mg by mouth nightly for psychosis and depression. We'll need to check hemoglobin A1c and lipid panel. The patient also has hydroxyzine 25 mg by mouth 3 times a day for anxiety. Will avoid all benzodiazepine Su to history of alcohol and cocaine use.   Alcohol use disorder, severe, cocaine use disorder, moderate: The patient was advised to abstain from alcohol and all illicit drugs as they may worsen mood symptoms. Will place the patient on Ativan per CIWA. Alcohol level was less than 5 and admission and the patient was just at [facility] 2 days ago. He will most likely not need a longer detox. At this time, the patient is interested in residential substance abuse treatment.   Will continue supportive psychotherapy on the unit and he was encouraged to attend groups on a regular basis  Disposition: The patient wishes to seek residential substance abuse treatment. He does have a stable living situation with his mother currently. Will need psychotropic medication management follow-up appointment after discharge but has no psychiatrist currently and no insurance.  Medical Decision Making:  Review of Psycho-Social Stressors (1), Review or order clinical lab tests (1), Order AIMS Test (2), Review or order medicine tests (1) and Review of Medication Regimen & Side Effects (2)  I certify that inpatient services furnished can reasonably be expected to improve the patient's condition.   Franklin [name] 10/06/2014, 1:37 PM

## 2014-10-06 NOTE — Progress Notes (Signed)
D: patient still experiencing withdrawals.  Patient states that anxiety level is high and depression is high.  Offered patient anxiety medications and patient denied.  Patient seen in the milieu several times.  Patient denies any SI or HI at this time.  Patient denies any auditory or visual hallucinations at this time.  Patient in scrubs.  Patient compliant with medications.  Patient in no distress at this time. A: encouragement and support provided.  Medications given as prescribed.  Encouraged patient went to let staff know if symptoms worsen.   R: patient receptive of care.

## 2014-10-07 DIAGNOSIS — F333 Major depressive disorder, recurrent, severe with psychotic symptoms: Secondary | ICD-10-CM

## 2014-10-07 MED ORDER — PANTOPRAZOLE SODIUM 40 MG PO TBEC: 40.0000 mg | DELAYED_RELEASE_TABLET | Freq: Every day | ORAL | Status: DC

## 2014-10-07 MED ORDER — GABAPENTIN 100 MG PO CAPS: 200.0000 mg | ORAL_CAPSULE | Freq: Three times a day (TID) | ORAL | Status: DC

## 2014-10-07 MED ORDER — HYDROXYZINE HCL 25 MG PO TABS: 25.0000 mg | ORAL_TABLET | Freq: Three times a day (TID) | ORAL | Status: DC | PRN

## 2014-10-07 MED ORDER — QUETIAPINE FUMARATE 100 MG PO TABS
100.0000 mg | ORAL_TABLET | Freq: Every day | ORAL | Status: DC
  Administered 2014-10-07 – 2014-10-08 (×2): 100 mg via ORAL
  Filled 2014-10-07 (×2): qty 1

## 2014-10-07 MED ORDER — QUETIAPINE FUMARATE 100 MG PO TABS: 100.0000 mg | ORAL_TABLET | Freq: Every day | ORAL | Status: DC

## 2014-10-07 MED ORDER — BUPROPION HCL ER (XL) 150 MG PO TB24: 150.0000 mg | ORAL_TABLET | Freq: Every day | ORAL | Status: DC

## 2014-10-07 NOTE — Progress Notes (Signed)
D: Pt denies SI/HI/AV. Pt is pleasant and cooperative. Pt stated he was doing better. Pt observed interacting on the unit for a little while.   A: Pt was offered support and encouragement. Pt was given scheduled medications. Pt was encourage to attend groups. Q 15 minute checks were done for safety.   R:Pt attends groups and interacts well with peers and staff. Pt is taking medication. Pt has no complaints at this time .Pt receptive to treatment and safety maintained on unit.

## 2014-10-07 NOTE — [facility] Group Notes (Signed)
[facility] LCSW Aftercare Discharge Planning Group Note   10/07/2014 3:11 PM  Participation Quality:  Did Not Attend  Mood/Affect:  Did Not Attend  Depression Rating:    Anxiety Rating:    Thoughts of Suicide:  Did Not Attend Will you contract for safety?   Did Not Attend  Current AVH:  Did Not Attend  Plan for Discharge/Comments:    Transportation Means:   Supports:  [name]

## 2014-10-07 NOTE — Plan of Care (Signed)
Problem: Ineffective individual coping Goal: LTG: Patient will report a decrease in negative feelings Outcome: Not Progressing Patient reports being anxious and concerned about the changes coming to his life and his ability to make the appropriate decisions over the next week.  Goal: STG: Pt will be able to identify effective and ineffective STG: Pt will be able to identify effective and ineffective coping patterns  Outcome: Progressing Med compliant, interactive in the milieu, willing to participate in the assessment process.

## 2014-10-07 NOTE — [facility] Group Notes (Signed)
[facility] LCSW Group Therapy  10/07/2014 2:29 PM  Type of Therapy:  Group Therapy  Participation Level:  Minimal  Participation Quality:  Appropriate and Attentive  Affect:  Appropriate  Cognitive:  Alert, Appropriate and Oriented  Insight:  Engaged  Engagement in Therapy:  Engaged  Modes of Intervention:  Discussion, Socialization and Support  Summary of Progress/Problems: Patient attended group and introduced himself and sharing that he likes "fishing" as a self care activity. Patient shared that his sister does not listen to him and related to other group members that she cares about him but he is "never right in her eyes". Patient talked about how this brings about feelings of insignificance.   Lulu Riding, MSW, LCSWA 10/07/2014, 2:29 PM

## 2014-10-07 NOTE — Progress Notes (Signed)
D: patient remained in the bed for most of the shift. Went to 1 group this shift. Patient denies any SI of HI at this time.  Patient energy level is low. Appetite is good. Patient in no distress at this time. Patient remains compliant with medications  A: medications given as prescribed. Support and encouragement given. Encouraged patient to attend groups.  R: patient receptive of care.

## 2014-10-07 NOTE — Progress Notes (Signed)
[facility] MD Progress Note  10/07/2014 10:18 AM Franklin [name]  MRN:  [id number]  Subjective:  Franklin [name] still feels very depressed, anxious, and suicidal. He still hallucinates but is unable to make the voices out. He is really disappointed in himself as she relapsed on substances after 2 years of sobriety when he stayed at the [place] [facility]. He hopes to go to [facility] for substance abuse treatment and then go back to [facility]. He seems to tolerate medications well. He has no somatic complaints. Sleep and appetite are good. This morning he was in bed not participating in activities. Of note he was recently discharged from [facility] after a brief stay. He was discharge to attend his court for DUI.  Principal Problem: <principal problem not specified> Diagnosis:   Patient Active Problem List   Diagnosis Date Noted  . Tobacco use disorder [F17.200] 10/06/2014  . Major depressive disorder, recurrent episode, severe, with psychotic behavior (HCC) [F33.3] 10/05/2014  . Cocaine use disorder, severe, dependence (HCC) [F14.20] 10/02/2014  . 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 Alcohol use disorder, severe, dependence (HCC) [F10.20] 06/18/2014  . Alcohol dependence with alcohol-induced disorder (HCC) [F10.29] 05/22/2013  . Substance induced mood disorder (HCC) [F19.94] 03/14/2013  . Polysubstance abuse [F19.10] 04/08/2012   Total Time spent with patient: 20 minutes  Past Psychiatric History: There is a long history of depression and substance use with multiple hospitalizations and suicide attempts.  Past Medical History:  Past Medical History  Diagnosis Date  . Depression   . Anxiety   . Bipolar 1 disorder (HCC)   . No pertinent past medical history   . Schizophrenia [facility])     Past Surgical History  Procedure Laterality Date  . No past surgeries     Family History:  Family History  Problem Relation Age of Onset  . Hypertension  Other   . Diabetes Other    Family Psychiatric  History: None reported.  Social History:  History  Alcohol Use  . 7.2 oz/week  . 12 Cans of beer per week    Comment: He drinks one 12 pack of beer per day. There is a long history of alcohol use.     History  Drug Use  . 2.00 per week  . Special: "Crack" cocaine, Cocaine    Comment: He smokes cocaine twice a week    Social History   Social History  . Marital Status: Single    Spouse Name: N/A  . Number of Children: N/A  . Years of Education: N/A   Social History Main Topics  . Smoking status: Current Every Day Smoker -- 1.00 packs/day    Types: Cigarettes  . Smokeless tobacco: Never Used  . Alcohol Use: 7.2 oz/week    12 Cans of beer per week     Comment: He drinks one 12 pack of beer per day. There is a long history of alcohol use.  . Drug Use: 2.00 per week    Special: "Crack" cocaine, Cocaine     Comment: He smokes cocaine twice a week  . Sexual Activity: Yes   Other Topics Concern  . None   Social History Narrative   The patient was born and raised in with that by both his biological parents. His father's past way but his mother still living. He denies any history of any physical or sexual abuse. He says he completed 2 years of college at [employer]. 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 He has never been married but has a 38 year old son who lives with his mother. He says he does get to see his son and has a relationship with him. He is not currently dating or in a relationship.      The patient does have a history of a DUI and had a court date last Friday for a DUI. He denies any other pending charges.               Additional Social History:                         Sleep: Good  Appetite:  Good  Current Medications: Current Facility-Administered Medications  Medication Dose Route Frequency Provider Last Rate Last  Dose  . acetaminophen (TYLENOL) tablet 650 mg  650 mg Oral Q6H PRN Beau Fanny, MD   650 mg at 10/06/14 1258  . alum & mag hydroxide-simeth (MAALOX/MYLANTA) 200-200-20 MG/5ML suspension 30 mL  30 mL Oral Q4H PRN Beau Fanny, MD      . buPROPion (WELLBUTRIN XL) 24 hr tablet 150 mg  150 mg Oral Daily Darliss Ridgel, MD   150 mg at 10/07/14 0946  . haloperidol (HALDOL) tablet 2 mg  2 mg Oral Q6H PRN Darliss Ridgel, MD      . hydrOXYzine (ATARAX/VISTARIL) tablet 25 mg  25 mg Oral TID PRN Darliss Ridgel, MD   25 mg at 10/06/14 2131  . loperamide (IMODIUM) capsule 2-4 mg  2-4 mg Oral PRN Darliss Ridgel, MD      . LORazepam (ATIVAN) tablet 1 mg  1 mg Oral Q6H PRN Darliss Ridgel, MD      . magnesium hydroxide (MILK OF MAGNESIA) suspension 30 mL  30 mL Oral Daily PRN Beau Fanny, MD      . multivitamin with minerals tablet 1 tablet  1 tablet Oral Daily Darliss Ridgel, MD   1 tablet at 10/07/14 0946  . ondansetron (ZOFRAN-ODT) disintegrating tablet 4 mg  4 mg Oral Q6H PRN Darliss Ridgel, MD      . pantoprazole (PROTONIX) EC tablet 40 mg  40 mg Oral QAC breakfast Darliss Ridgel, MD   40 mg at 10/07/14 0946  . QUEtiapine (SEROQUEL) tablet 50 mg  50 mg Oral QHS Darliss Ridgel, MD   50 mg at 10/06/14 2130  . thiamine (VITAMIN B-1) tablet 100 mg  100 mg Oral Daily Darliss Ridgel, MD   100 mg at 10/07/14 4098    Lab Results: No results found for this or any previous visit (from the past 48 hour(s)).  Physical Findings: AIMS: Facial and Oral Movements Muscles of Facial Expression: None, normal Lips and Perioral Area: None, normal Jaw: None, normal Tongue: None, normal,Extremity Movements Upper (arms, wrists, hands, fingers): None, normal Lower (legs, knees, ankles, toes): None, normal, Trunk Movements Neck, shoulders, hips: None, normal, Overall Severity Severity of abnormal movements (highest score from questions above): None, normal Incapacitation due to abnormal movements: None, normal Patient's  awareness of abnormal movements (rate only patient's report): No Awareness, Dental Status Current problems with teeth and/or dentures?: No Does patient usually wear dentures?: No  CIWA:  CIWA-Ar Total: 0 COWS:     Musculoskeletal: Strength & Muscle Tone: within normal limits Gait & Station: normal Patient leans: N/A  Psychiatric Specialty Exam: Review  of Systems  All other systems reviewed and are negative.   Blood pressure 121/85, pulse 67, temperature 98.3 F (36.8 C), temperature source Oral, resp. rate 20, height  (1.778 m), weight 72.576 kg (160 lb).Body mass index is 22.96 kg/(m^2).  General Appearance: Disheveled  Eye Solicitor::  Fair  Speech:  Clear and Coherent  Volume:  Normal  Mood:  Dysphoric  Affect:  Flat  Thought Process:  Goal Directed  Orientation:  Full (Time, Place, and Person)  Thought Content:  Hallucinations: Auditory  Suicidal Thoughts:  Yes.  with intent/plan  Homicidal Thoughts:  No  Memory:  Immediate;   Fair Recent;   Fair Remote;   Fair  Judgement:  Impaired  Insight:  Shallow  Psychomotor Activity:  Decreased  Concentration:  Fair  Recall:  [employer] of Knowledge:Fair  Language: Fair  Akathisia:  No  Handed:  Right  AIMS (if indicated):     Assets:  Communication Skills Desire for Improvement Physical Health Resilience Social Support  ADL's:  Intact  Cognition: WNL  Sleep:  Number of Hours: 7.45   Treatment Plan Summary: Daily contact with patient to assess and evaluate symptoms and progress in treatment and Medication management   Franklin [name] is a 44 year old single African-American male with a history of recurrent depression and polysubstance use as well as a substance-induced mood disorder. It is unclear whether or not psychotic symptoms are secondary to substance use or true psychosis associated with depression. The patient was endorsing passive suicidal thoughts in the context of using cocaine.   1. Suicidal ideation. The  patient is still suicidal and hopeless in the face of multiple social stressors.   2. Mood. The patient wants to remain on Wellbutrin XL for depression as he says this helped with him staying clean and sober for 2 years in the past.   3. Psychosis. We started Seroquel for psychosis and depression. We'll need to check hemoglobin A1c and lipid panel.  4. Anxiety. The patient also has hydroxyzine for anxiety. Will avoid all benzodiazepine due to history of alcohol and cocaine use.  5. Alcohol use disorder, severe, cocaine use disorder, moderate: The patient was advised to abstain from alcohol and all illicit drugs as they may worsen mood symptoms. Will  monitor for symptoms of withdrawal.  6. Substance abuse treatment. The patient wants to go to [facility] for residential substance abuse treatment.  7. GERD. We'll continue Protonix.  8. Smoking. Nicotine patch is available.  9. Disposition. To be established.   Will continue supportive psychotherapy on the unit and he was encouraged to attend groups on a regular basis   Franklin [name] 10/07/2014, 10:18 AM

## 2014-10-08 LAB — LIPID PANEL
Cholesterol: 159 mg/dL (ref 0–200)
HDL: 45 mg/dL (ref >40)
LDL Cholesterol: 63 mg/dL (ref 0–99)
Total CHOL/HDL Ratio: 3.5 RATIO
Triglycerides: 255 mg/dL — ABNORMAL HIGH (ref <150)
VLDL: 51 mg/dL — ABNORMAL HIGH (ref 0–40)

## 2014-10-08 LAB — HEMOGLOBIN A1C: Hgb A1c MFr Bld: 5.5 % (ref 4.0–6.0)

## 2014-10-08 LAB — TSH: TSH: 0.837 u[IU]/mL (ref 0.350–4.500)

## 2014-10-08 NOTE — Progress Notes (Signed)
D: Patient's mood has been calm and cooperative today. Rates depression 6/10 and anxiety 4/10. Denies SI/HI/AVH. No evidence of alcohol withdrawal. Reports good appetite but low energy level. A: On q 15 minute checks, given meds, offered emotional support. R: No acute distress.

## 2014-10-08 NOTE — Progress Notes (Signed)
[facility] MD Progress Note  10/08/2014 11:36 AM Franklin [name]  MRN:  [id number]  Subjective:  Franklin [name] still feels very depressed and anxious. He is disappointed that there are no beds available at [facility] for substance abuse treatment. He reports suicidal ideation but is able to contract for safety in the hospital. He still has auditory hallucinations but has not been able to make them out. He has no somatic complaints. Sleep and appetite are okay. He does not have symptoms of alcohol withdrawal. He tolerates medications well. He participates in programming.  Principal Problem: Major depressive disorder, recurrent episode, severe, with psychotic behavior (HCC) Diagnosis:   Patient Active Problem List   Diagnosis Date Noted  . Tobacco use disorder [F17.200] 10/06/2014  . Major depressive disorder, recurrent episode, severe, with psychotic behavior (HCC) [F33.3] 10/05/2014  . Cocaine use disorder, severe, dependence (HCC) [F14.20] 10/02/2014  . 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 Alcohol use disorder, severe, dependence (HCC) [F10.20] 06/18/2014  . Alcohol dependence with alcohol-induced disorder (HCC) [F10.29] 05/22/2013  . Substance induced mood disorder (HCC) [F19.94] 03/14/2013  . Polysubstance abuse [F19.10] 04/08/2012   Total Time spent with patient: 20 minutes  Past Psychiatric History: Long history of depression and mood instability and substance use.  Past Medical History:  Past Medical History  Diagnosis Date  . Depression   . Anxiety   . Bipolar 1 disorder (HCC)   . No pertinent past medical history   . Schizophrenia [facility])     Past Surgical History  Procedure Laterality Date  . No past surgeries     Family History:  Family History  Problem Relation Age of Onset  . Hypertension Other   . Diabetes Other    Family Psychiatric  History: None reported. Social History:  History  Alcohol Use  . 7.2 oz/week  . 12 Cans of  beer per week    Comment: He drinks one 12 pack of beer per day. There is a long history of alcohol use.     History  Drug Use  . 2.00 per week  . Special: "Crack" cocaine, Cocaine    Comment: He smokes cocaine twice a week    Social History   Social History  . Marital Status: Single    Spouse Name: N/A  . Number of Children: N/A  . Years of Education: N/A   Social History Main Topics  . Smoking status: Current Every Day Smoker -- 1.00 packs/day    Types: Cigarettes  . Smokeless tobacco: Never Used  . Alcohol Use: 7.2 oz/week    12 Cans of beer per week     Comment: He drinks one 12 pack of beer per day. There is a long history of alcohol use.  . Drug Use: 2.00 per week    Special: "Crack" cocaine, Cocaine     Comment: He smokes cocaine twice a week  . Sexual Activity: Yes   Other Topics Concern  . None   Social History Narrative   The patient was born and raised in with that by both his biological parents. His father's past way but his mother still living. He denies any history of any physical or sexual abuse. He says he completed 2 years of college at [employer]. 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 He has never been married but has a 63 year old son who lives with his mother.  He says he does get to see his son and has a relationship with him. He is not currently dating or in a relationship.      The patient does have a history of a DUI and had a court date last Friday for a DUI. He denies any other pending charges.               Additional Social History:                         Sleep: Good  Appetite:  Good  Current Medications: Current Facility-Administered Medications  Medication Dose Route Frequency Provider Last Rate Last Dose  . acetaminophen (TYLENOL) tablet 650 mg  650 mg Oral Q6H PRN Beau Fanny, MD   650 mg at 10/06/14 1258  . buPROPion (WELLBUTRIN XL) 24 hr  tablet 150 mg  150 mg Oral Daily Darliss Ridgel, MD   150 mg at 10/08/14 0940  . hydrOXYzine (ATARAX/VISTARIL) tablet 25 mg  25 mg Oral TID PRN Darliss Ridgel, MD   25 mg at 10/07/14 2127  . magnesium hydroxide (MILK OF MAGNESIA) suspension 30 mL  30 mL Oral Daily PRN Beau Fanny, MD      . multivitamin with minerals tablet 1 tablet  1 tablet Oral Daily Darliss Ridgel, MD   1 tablet at 10/08/14 0940  . pantoprazole (PROTONIX) EC tablet 40 mg  40 mg Oral QAC breakfast Darliss Ridgel, MD   40 mg at 10/08/14 0940  . QUEtiapine (SEROQUEL) tablet 100 mg  100 mg Oral QHS Shari Prows, MD   100 mg at 10/07/14 2126  . thiamine (VITAMIN B-1) tablet 100 mg  100 mg Oral Daily Darliss Ridgel, MD   100 mg at 10/08/14 0940    Lab Results:  Results for orders placed or performed during the hospital encounter of 10/05/14 (from the past 48 hour(s))  Lipid panel     Status: Abnormal   Collection Time: 10/08/14  9:40 AM  Result Value Ref Range   Cholesterol 159 0 - 200 mg/dL   Triglycerides 161 (H) <150 mg/dL   HDL 45 >09 mg/dL   Total CHOL/HDL Ratio 3.5 RATIO   VLDL 51 (H) 0 - 40 mg/dL   LDL Cholesterol 63 0 - 99 mg/dL    Comment:        Total Cholesterol/HDL:CHD Risk Coronary Heart Disease Risk Table                     Men   Women  1/2 Average Risk   3.4   3.3  Average Risk       5.0   4.4  2 X Average Risk   9.6   7.1  3 X Average Risk  23.4   11.0        Use the calculated Patient Ratio above and the CHD Risk Table to determine the patient's CHD Risk.        ATP III CLASSIFICATION (LDL):  <100     mg/dL   Optimal  604-540  mg/dL   Near or Above                    Optimal  130-159  mg/dL   Borderline  981-191  mg/dL   High  >478     mg/dL   Very High   TSH     Status: None  Collection Time: 10/08/14  9:40 AM  Result Value Ref Range   TSH 0.837 0.350 - 4.500 uIU/mL    Physical Findings: AIMS: Facial and Oral Movements Muscles of Facial Expression: None, normal Lips and  Perioral Area: None, normal Jaw: None, normal Tongue: None, normal,Extremity Movements Upper (arms, wrists, hands, fingers): None, normal Lower (legs, knees, ankles, toes): None, normal, Trunk Movements Neck, shoulders, hips: None, normal, Overall Severity Severity of abnormal movements (highest score from questions above): None, normal Incapacitation due to abnormal movements: None, normal Patient's awareness of abnormal movements (rate only patient's report): No Awareness, Dental Status Current problems with teeth and/or dentures?: No Does patient usually wear dentures?: No  CIWA:  CIWA-Ar Total: 0 COWS:     Musculoskeletal: Strength & Muscle Tone: within normal limits Gait & Station: normal Patient leans: N/A  Psychiatric Specialty Exam: Review of Systems  All other systems reviewed and are negative.   Blood pressure 133/85, pulse 60, temperature 98.3 F (36.8 C), temperature source Oral, resp. rate 20, height 5\' 10"  (1.778 m), weight 72.576 kg (160 lb), SpO2 98 %.Body mass index is 22.96 kg/(m^2).  General Appearance: Casual  Eye Contact::  Good  Speech:  Clear and Coherent  Volume:  Normal  Mood:  Depressed  Affect:  Flat  Thought Process:  Goal Directed  Orientation:  Full (Time, Place, and Person)  Thought Content:  Hallucinations: Auditory  Suicidal Thoughts:  Yes.  with intent/plan  Homicidal Thoughts:  No  Memory:  Immediate;   Fair Recent;   Fair Remote;   Fair  Judgement:  Impaired  Insight:  Fair and Shallow  Psychomotor Activity:  Normal  Concentration:  Fair  Recall:  [employer] of Knowledge:Fair  Language: Fair  Akathisia:  No  Handed:  Right  AIMS (if indicated):     Assets:  Communication Skills Desire for Improvement Physical Health Resilience Social Support  ADL's:  Intact  Cognition: WNL  Sleep:  Number of Hours: 7.25   Treatment Plan Summary: Daily contact with patient to assess and evaluate symptoms and progress in treatment and  Medication management   Franklin [name] is a 44 year old single African-American male with a history of recurrent depression and polysubstance use as well as a substance-induced mood disorder. It is unclear whether or not psychotic symptoms are secondary to substance use or true psychosis associated with depression. The patient was endorsing passive suicidal thoughts in the context of using cocaine.  1. Suicidal ideation. The patient is still suicidal and hopeless in the face of multiple social stressors.   2. Mood. The patient wants to remain on Wellbutrin XL for depression as he says this helped with him staying clean and sober for 2 years in the past.   3. Psychosis. We started Seroquel for psychosis and depression. Cholesterol 159, triglycerides are elevated. Hemoglobin A1c pending.   4. Anxiety. The patient also has hydroxyzine for anxiety. Will avoid all benzodiazepine due to history of alcohol and cocaine use.  5. Alcohol use disorder, severe, cocaine use disorder, moderate: The patient was advised to abstain from alcohol and all illicit drugs as they may worsen mood symptoms. Will monitor for symptoms of withdrawal.  6. Substance abuse treatment. The patient desires residential substance abuse treatment.  7. GERD. We'll continue Protonix.  8. Smoking. Nicotine patch is available.  9. Disposition. To be established.    Simcha Speir 10/08/2014, 11:36 AM

## 2014-10-08 NOTE — Plan of Care (Signed)
Problem: Alteration in mood Goal: STG-Pt Able to Identify Plan For Continuing Care at D/C Pt. Will be able to identify a plan for continuing care at discharge  Outcome: Progressing Patient hoping to go to [facility] or substance abuse center in [place].

## 2014-10-08 NOTE — Plan of Care (Signed)
Problem: Alteration in mood Goal: LTG-Patient reports reduction in suicidal thoughts (Patient reports reduction in suicidal thoughts and is able to verbalize a safety plan for whenever patient is feeling suicidal)  Outcome: Progressing Pt denies SI at this time     

## 2014-10-08 NOTE — BHH Counselor (Signed)
Adult Comprehensive Assessment  Patient ID: Franklin [name], male   DOB: 06-12-1970, 44 y.o.   MRN: [id number]  Information Source: Information source: Patient  Current Stressors:  Employment / Job issues: unemployed Housing / Lack of housing: homeless Social relationships: limited due to drug use Substance abuse: cocaine and alcohol  Living/Environment/Situation:  Living Arrangements: Parent (mother or sister) Living conditions (as described by patient or guardian): homeless right now How long has patient lived in current situation?: since May after leaving [facility] due to relapse.  Family History:  Marital status: Single Does patient have children?: Yes How many children?: 1 How is patient's relationship with their children?: 79 yo son, good relationship, lives with his mother and pt gets to see him.  Childhood History:  By whom was/is the patient raised?: Both parents Description of patient's relationship with caregiver when they were a child: good Patient's description of current relationship with people who raised him/her: good Does patient have siblings?: Yes Number of Siblings: 9 Description of patient's current relationship with siblings: 8 sisters and 1 brother Did patient suffer any verbal/emotional/physical/sexual abuse as a child?: No Did patient suffer from severe childhood neglect?: No Has patient ever been sexually abused/assaulted/raped as an adolescent or adult?: No Was the patient ever a victim of a crime or a disaster?: No Witnessed domestic violence?: No Has patient been effected by domestic violence as an adult?: No  Education:  Highest grade of school patient has completed: 12th and some college, 2 years at [employer] studying sociology Currently a Consulting civil engineer?: No Learning disability?: No  Employment/Work Situation:   Employment situation: Unemployed Patient's job has been impacted by current illness: Yes Describe how patient's job has been  impacted: substance use has prevented gaining employment and maintaining  What is the longest time patient has a held a job?: 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  Some college education.  Supportive sister.  Has history of 2 yrs sober before relapsing 4 months ago.  Discharge Plan:   Does patient have access to transportation?: Yes Will patient be returning to same living situation after discharge?: Yes Plan for living situation after discharge: with sister until can get into transitional housing, find work and then get [facility] Currently receiving community mental health services: No If no, would patient like referral for services when discharged?: Yes (What county?) (depends on where he goes, he is uninsured so will need IPRS provider.) Does patient have financial  barriers related to discharge medications?: Yes Patient description of barriers related to discharge  medications: unemployed  Summary/Recommendations:    Pt verbalizes history of alcohol and drug use being at the heart of depressive symptoms.  Describes wanting to get life on track and plans to do so by going to treatment for a period to also save money in order to save for initial fee for [facility].  Pt seems to have some insight into his addiction and realistic expectations.  Recommend residential SA treatment/or transitional sober housing program and 12 step support group.  While on the unit, Pt will have the opportunity to participate in groups and therapeutic milieu.  He will have medications managed and assistance with discharge planning.  Pt will follow up with [facility] if [facility] or [facility] are unavailable.  [name] 10/08/2014, MSW LCSW

## 2014-10-08 NOTE — BHH Group Notes (Signed)
BHH Group Notes:  (Nursing/MHT/Case Management/Adjunct)  Date:  10/08/2014  Time:  4:57 PM  Type of Therapy:  Psychoeducational Skills  Participation Level:  Active  Participation Quality:  Appropriate, Attentive and Sharing  Affect:  Appropriate  Cognitive:  Alert and Appropriate  Insight:  Appropriate and Good  Engagement in Group:  Engaged  Modes of Intervention:  Discussion, Education and Support  Summary of Progress/Problems:  Lynelle Smoke Neco Kling 10/08/2014, 4:57 PM

## 2014-10-09 NOTE — BHH Suicide Risk Assessment (Signed)
[facility] Discharge Suicide Risk Assessment   Demographic Factors:  Male, Low socioeconomic status and Unemployed  Total Time spent with patient: 30 minutes  Musculoskeletal: Strength & Muscle Tone: within normal limits Gait & Station: normal Patient leans: N/A  Psychiatric Specialty Exam: Physical Exam  Nursing note and vitals reviewed.   Review of Systems  All other systems reviewed and are negative.   Blood pressure 150/85, pulse 52, temperature 98.3 F (36.8 C), temperature source Oral, resp. rate 20, height  (1.778 m), weight 72.576 kg (160 lb), SpO2 98 %.Body mass index is 22.96 kg/(m^2).  General Appearance: Casual  Eye Contact::  Good  Speech:  Clear and Coherent409  Volume:  Normal  Mood:  Euthymic  Affect:  Appropriate  Thought Process:  Goal Directed  Orientation:  Full (Time, Place, and Person)  Thought Content:  WDL  Suicidal Thoughts:  No  Homicidal Thoughts:  No  Memory:  Immediate;   Fair Recent;   Fair Remote;   Fair  Judgement:  Fair  Insight:  Fair  Psychomotor Activity:  Normal  Concentration:  Fair  Recall:  [employer] of Knowledge:Fair  Language: Fair  Akathisia:  No  Handed:  Right  AIMS (if indicated):     Assets:  Communication Skills Desire for Improvement Physical Health Resilience Social Support  Sleep:  Number of Hours: 7.75  Cognition: WNL  ADL's:  Intact   Have you used any form of tobacco in the last 30 days? (Cigarettes, Smokeless Tobacco, Cigars, and/or Pipes): Yes  Has this patient used any form of tobacco in the last 30 days? (Cigarettes, Smokeless Tobacco, Cigars, and/or Pipes) Yes, A prescription for an FDA-approved tobacco cessation medication was offered at discharge and the patient refused  Mental Status Per Nursing Assessment::   On Admission:  Suicidal ideation indicated by patient, Self-harm thoughts  Current Mental Status by Physician: NA  Loss Factors: Financial problems/change in socioeconomic  status  Historical Factors: Impulsivity  Risk Reduction Factors:   Sense of responsibility to family and Positive social support  Continued Clinical Symptoms:  Depression:   Comorbid alcohol abuse/dependence Severe Alcohol/Substance Abuse/Dependencies  Cognitive Features That Contribute To Risk:  None    Suicide Risk:  Minimal: No identifiable suicidal ideation.  Patients presenting with no risk factors but with morbid ruminations; may be classified as minimal risk based on the severity of the depressive symptoms  Principal Problem: Major depressive disorder, recurrent episode, severe, with psychotic behavior [facility]) Discharge Diagnoses:  Patient Active Problem List   Diagnosis Date Noted  . Tobacco use disorder [F17.200] 10/06/2014  . Major depressive disorder, recurrent episode, severe, with psychotic behavior (HCC) [F33.3] 10/05/2014  . Cocaine use disorder, severe, dependence (HCC) [F14.20] 10/02/2014  . 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 Alcohol use disorder, severe, dependence (HCC) [F10.20] 06/18/2014  . Alcohol dependence with alcohol-induced disorder (HCC) [F10.29] 05/22/2013  . Substance induced mood disorder (HCC) [F19.94] 03/14/2013  . Polysubstance abuse [F19.10] 04/08/2012      Plan Of Care/Follow-up recommendations:  Activity:  As tolerated. Diet:  Low sodium heart healthy. Other:  Keep follow-up appointments.  Is patient on multiple antipsychotic therapies at discharge:  No   Has Patient had three or more failed trials of antipsychotic monotherapy by history:  No  Recommended Plan for Multiple Antipsychotic Therapies: NA    Bodhi Moradi 10/09/2014, 10:44 AM

## 2014-10-09 NOTE — Progress Notes (Signed)
D:Patient aware of discharge this shift . Patient returning home . Patient received all belonging locked up . Patient denies  Suicidal  And homicidal ideations  .  A: Writer instructed on discharge criteria  . Informed of 7 day supply  Of medication  prescriptions  given to patient  . Aware  Of follow up appointment . R: Patient left unit with no questions  Or concerns  With sister

## 2014-10-09 NOTE — Discharge Summary (Signed)
Physician Discharge Summary Note  Patient:  Franklin [name] is an 44 y.o., male MRN:  [id number] DOB:  1970/04/15 Patient phone:  [phone] (home)  Patient address:   [address] [place] [address],  Total Time spent with patient: 30 minutes  Date of Admission:  10/05/2014 Date of Discharge: 10/09/2014  Reason for Admission:  Suicidal ideation.  Identifying data. Franklin [name] is a 44 year old male with a history of depression, mood instability and substance abuse.  Chief complaint. "I am trying to prevent suicide."  History of present illness. Franklin [name] is a long history of depression and substance use. He reports having been clean alcohol and cocaine for the past 2 years. He relapsed recently. He became depressed with poor sleep, decreased appetite, anhedonia, feeling of guilt and hopelessness worthlessness, poor energy and concentration, social isolation, crying spells, and suicidal thinking. His plan was to use cocaine to kill himself. His anxiety has increased. He also reports auditory command hallucinations urging him to hurt himself. He denies symptoms suggestive of bipolar mania. He reports heavy alcohol and cocaine use. Of note the patient was just discharged from [facility] following a brief hospitalization.   Past psychiatric history. Multiple hospital admission for depression and substance. He reports prior suicide attempts.   Family psychiatric history none reported.  Social history. He is homeless, unemployed and uninsured. He has supportive sister.  Principal Problem: Major depressive disorder, recurrent episode, severe, with psychotic behavior [facility]) Discharge Diagnoses: Patient Active Problem List   Diagnosis Date Noted  . Tobacco use disorder [F17.200] 10/06/2014  . Major depressive disorder, recurrent episode, severe, with psychotic behavior (HCC) [F33.3] 10/05/2014  . Cocaine use disorder, severe, dependence (HCC) [F14.20] 10/02/2014  . Major depressive disorder,  recurrent, severe without psychotic features (HCC) [F33.2]   . Alcohol use disorder, severe, dependence (HCC) [F10.20] 06/18/2014  . Alcohol dependence with alcohol-induced disorder (HCC) [F10.29] 05/22/2013  . Substance induced mood disorder (HCC) [F19.94] 03/14/2013  . Polysubstance abuse [F19.10] 04/08/2012    Musculoskeletal: Strength & Muscle Tone: within normal limits Gait & Station: normal Patient leans: N/A  Psychiatric Specialty Exam: Physical Exam  Nursing note and vitals reviewed.   Review of Systems  All other systems reviewed and are negative.   Blood pressure 150/85, pulse 52, temperature 98.3 F (36.8 C), temperature source Oral, resp. rate 20, height  (1.778 m), weight 72.576 kg (160 lb), SpO2 98 %.Body mass index is 22.96 kg/(m^2).  See SRA.                                                  Sleep:  Number of Hours: 7.75   Have you used any form of tobacco in the last 30 days? (Cigarettes, Smokeless Tobacco, Cigars, and/or Pipes): Yes  Has this patient used any form of tobacco in the last 30 days? (Cigarettes, Smokeless Tobacco, Cigars, and/or Pipes) Yes, A prescription for an FDA-approved tobacco cessation medication was offered at discharge and the patient refused  Past Medical History:  Past Medical History  Diagnosis Date  . Depression   . Anxiety   . Bipolar 1 disorder (HCC)   . No pertinent past medical history   . Schizophrenia [facility])     Past Surgical History  Procedure Laterality Date  . No past surgeries     Family History:  Family History  Problem  Relation Age of Onset  . Hypertension Other   . Diabetes Other    Social History:  History  Alcohol Use  . 7.2 oz/week  . 12 Cans of beer per week    Comment: He drinks one 12 pack of beer per day. There is a long history of alcohol use.     History  Drug Use  . 2.00 per week  . Special: "Crack" cocaine, Cocaine    Comment: He smokes cocaine twice a week     Social History   Social History  . Marital Status: Single    Spouse Name: N/A  . Number of Children: N/A  . Years of Education: N/A   Social History Main Topics  . Smoking status: Current Every Day Smoker -- 1.00 packs/day    Types: Cigarettes  . Smokeless tobacco: Never Used  . Alcohol Use: 7.2 oz/week    12 Cans of beer per week     Comment: He drinks one 12 pack of beer per day. There is a long history of alcohol use.  . Drug Use: 2.00 per week    Special: "Crack" cocaine, Cocaine     Comment: He smokes cocaine twice a week  . Sexual Activity: Yes   Other Topics Concern  . None   Social History Narrative   The patient was born and raised in with that by both his biological parents. His father's past way but his mother still living. He denies any history of any physical or sexual abuse. He says he completed 2 years of college at [employer]. 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 He has never been married but has a 69 year old son who lives with his mother. He says he does get to see his son and has a relationship with him. He is not currently dating or in a relationship.      The patient does have a history of a DUI and had a court date last Friday for a DUI. He denies any other pending charges.                Past Psychiatric History: Hospitalizations:  Outpatient Care:  Substance Abuse Care:  Self-Mutilation:  Suicidal Attempts:  Violent Behaviors:   Risk to Self: Is patient at risk for suicide?: Yes What has been your use of drugs/alcohol within the last 12 months?: cocaine and alcohol-alcohol daily and cocaine 2x week, marajuana Risk to Others:   Prior Inpatient Therapy:   Prior Outpatient Therapy:    Level of Care:  OP  Hospital Course:    Franklin [name] is a 44 year old single African-American male with a history of recurrent depression and polysubstance use as well as a  substance-induced mood disorder. It is unclear whether or not psychotic symptoms are secondary to substance use or true psychosis associated with depression. The patient was endorsing passive suicidal thoughts in the context of using cocaine.  1. Suicidal ideation. This has resolved. The patient is able to contract for safety.    2. Mood. The patient wants to remain on Wellbutrin XL for depression as he says this helped with him staying clean and sober for 2 years in the past.   3. Psychosis. We started Seroquel for psychosis and depression. Cholesterol 159, triglycerides are elevated. Hemoglobin A1c pending.   4. Anxiety. We started hydroxyzine for anxiety.  5. Substance abuse. The patient did not require alcohol detox. Vital  signs were stable. The patient was advised to abstain from alcohol and all illicit drugs as they may worsen mood symptoms.   6. Substance abuse treatment. The patient desires residential substance abuse treatment. He was referred to [facility].  7. GERD. We continued Protonix.  8. Smoking. Nicotine patch was available.  9. Disposition. He was discharged to a homeless shelter. He will follow up with [facility].     Consults:  None  Significant Diagnostic Studies:  None  Discharge Vitals:   Blood pressure 150/85, pulse 52, temperature 98.3 F (36.8 C), temperature source Oral, resp. rate 20, height 5\' 10"  (1.778 m), weight 72.576 kg (160 lb), SpO2 98 %. Body mass index is 22.96 kg/(m^2). Lab Results:   Results for orders placed or performed during the hospital encounter of 10/05/14 (from the past 72 hour(s))  Lipid panel     Status: Abnormal   Collection Time: 10/08/14  9:40 AM  Result Value Ref Range   Cholesterol 159 0 - 200 mg/dL   Triglycerides 960 (H) <150 mg/dL   HDL 45 >45 mg/dL   Total CHOL/HDL Ratio 3.5 RATIO   VLDL 51 (H) 0 - 40 mg/dL   LDL Cholesterol 63 0 - 99 mg/dL    Comment:        Total Cholesterol/HDL:CHD Risk Coronary Heart Disease Risk  Table                     Men   Women  1/2 Average Risk   3.4   3.3  Average Risk       5.0   4.4  2 X Average Risk   9.6   7.1  3 X Average Risk  23.4   11.0        Use the calculated Patient Ratio above and the CHD Risk Table to determine the patient's CHD Risk.        ATP III CLASSIFICATION (LDL):  <100     mg/dL   Optimal  409-811  mg/dL   Near or Above                    Optimal  130-159  mg/dL   Borderline  914-782  mg/dL   High  >956     mg/dL   Very High   Hemoglobin A1c     Status: None   Collection Time: 10/08/14  9:40 AM  Result Value Ref Range   Hgb A1c MFr Bld 5.5 4.0 - 6.0 %  TSH     Status: None   Collection Time: 10/08/14  9:40 AM  Result Value Ref Range   TSH 0.837 0.350 - 4.500 uIU/mL    Physical Findings: AIMS: Facial and Oral Movements Muscles of Facial Expression: None, normal Lips and Perioral Area: None, normal Jaw: None, normal Tongue: None, normal,Extremity Movements Upper (arms, wrists, hands, fingers): None, normal Lower (legs, knees, ankles, toes): None, normal, Trunk Movements Neck, shoulders, hips: None, normal, Overall Severity Severity of abnormal movements (highest score from questions above): None, normal Incapacitation due to abnormal movements: None, normal Patient's awareness of abnormal movements (rate only patient's report): No Awareness, Dental Status Current problems with teeth and/or dentures?: No Does patient usually wear dentures?: No  CIWA:  CIWA-Ar Total: 0 COWS:      See Psychiatric Specialty Exam and Suicide Risk Assessment completed by Attending Physician prior to discharge.  Discharge destination:  Home  Is patient on multiple antipsychotic therapies at discharge:  No  Has Patient had three or more failed trials of antipsychotic monotherapy by history:  No    Recommended Plan for Multiple Antipsychotic Therapies: NA  Discharge Instructions    Diet - low sodium heart healthy    Complete by:  As directed       Increase activity slowly    Complete by:  As directed             Medication List    TAKE these medications      Indication   acetaminophen 325 MG tablet  Commonly known as:  TYLENOL  Take 650 mg by mouth every 6 (six) hours as needed for mild pain.      buPROPion 150 MG 24 hr tablet  Commonly known as:  WELLBUTRIN XL  Take 1 tablet (150 mg total) by mouth daily.   Indication:  Major Depressive Disorder     gabapentin 100 MG capsule  Commonly known as:  NEURONTIN  Take 2 capsules (200 mg total) by mouth 3 (three) times daily.   Indication:  Neuropathic Pain, neuropathic pain     hydrOXYzine 25 MG tablet  Commonly known as:  ATARAX/VISTARIL  Take 1 tablet (25 mg total) by mouth 3 (three) times daily as needed for anxiety.   Indication:  Anxiety Neurosis     multivitamin with minerals Tabs tablet  Take 1 tablet by mouth daily.   Indication:  Nutritional Support     pantoprazole 40 MG tablet  Commonly known as:  PROTONIX  Take 1 tablet (40 mg total) by mouth daily before breakfast.   Indication:  Gastroesophageal Reflux Disease     QUEtiapine 100 MG tablet  Commonly known as:  SEROQUEL  Take 1 tablet (100 mg total) by mouth at bedtime.   Indication:  psychosis         Follow-up recommendations:  Activity:  As tolerated. Diet:  Low sodium heart healthy. Other:  Keep follow-up appointments.  Comments:    Total Discharge Time: 35 min.  Signed: Tyrell Brereton 10/09/2014, 10:48 AM

## 2014-10-09 NOTE — Progress Notes (Signed)
  [facility] Adult Case Management Discharge Plan :  Will you be returning to the same living situation after discharge:  No. At discharge, do you have transportation home?: Yes,  Sister  Do you have the ability to pay for your medications: Yes,  Mental health   Release of information consent forms completed and in the chart;  Patient's signature needed at discharge.  Patient to Follow up at: Follow-up Information    Follow up with [facility] on 10/11/2014.   Why:  For hospital follow-up appt on Friday 10/11/14 between 8:00 AM-10:00 AM during walk-in hours   Contact information:   [address] [place], [place]  Ph [phone] Fax [phone]      Patient denies SI/HI: Yes,  Yes    Safety Planning and Suicide Prevention discussed: Yes,  With patient.   Have you used any form of tobacco in the last 30 days? (Cigarettes, Smokeless Tobacco, Cigars, and/or Pipes): Yes  Has patient been referred to the Quitline?: Patient refused referral  Rondall Allegra MSW, LCSWA  10/09/2014, 2:37 PM

## 2014-10-09 NOTE — [facility] Group Notes (Signed)
[facility] Group Notes:  (Nursing/MHT/Case Management/Adjunct)  Date:  10/09/2014  Time:  2:18 PM  Type of Therapy:  Psychoeducational Skills  Participation Level:  Active  Participation Quality:  Appropriate  Affect:  Appropriate  Cognitive:  Appropriate  Insight:  Appropriate  Engagement in Group:  Engaged  Modes of Intervention:  Discussion, Education and Support  Summary of Progress/Problems:  Darrow Bussing 10/09/2014, 2:18 PM

## 2014-10-09 NOTE — [facility] Group Notes (Signed)
[facility] Group Notes:  (Nursing/MHT/Case Management/Adjunct)  Date:  10/09/2014  Time:  5:14 AM  Type of Therapy:  Group Therapy  Participation Level:  Active  Participation Quality:  Appropriate  Affect:  Appropriate  Cognitive:  Appropriate  Insight:  Appropriate  Engagement in Group:  Engaged  Modes of Intervention:  n/a  Summary of Progress/Problems:  Franklin [name] 10/09/2014, 5:14 AM

## 2014-10-09 NOTE — [facility] Group Notes (Signed)
[facility] LCSW Aftercare Discharge Planning Group Note   10/09/2014 11:29 AM  Participation Quality:  Patient attended, introduced himself and shared his SMART is to "go to [facility] and get treatment". Patient was attentive throughout group discussion AEB by body language and eye contact.   Mood/Affect:  Appropriate   Thoughts of Suicide:  No Will you contract for safety?   NA  Current AVH:  No  Plan for Discharge/Comments:  Patient plans to go to [facility] for treatment    Lulu Riding, MSW, [employer]

## 2014-10-09 NOTE — Plan of Care (Signed)
Problem: Ineffective individual coping Goal: STG: Patient will remain free from self harm Outcome: Progressing Medications (Seroquel 100 mg) administered as ordered  by the physician, medications Therapeutic Effects, SEs and Adverse effects discussed, questions encouraged; Hydroxyzine 25 mg PO PRN given for anxiety, labs reviewed per patient request, 15 minute checks maintained for safety, clinical and moral support provided, patient encouraged to continue to express feelings and demonstrate safe care. Patient remain free from harm, will continue to monitor.

## 2014-10-09 NOTE — Progress Notes (Signed)
A&Ox3, denied pain, denied SIB/SI/HI, denied AV/H, interacting well with peers, no behavioral problems, will continue with POC. 

## 2014-10-10 NOTE — Tx Team (Signed)
Interdisciplinary Treatment Plan Update (Adult)  Date:  10/10/2014 (Late entry for 10/09/2014)  Time Reviewed:  8:48 AM  Progress in Treatment: Attending groups: Yes. Participating in groups:  Yes. Taking medication as prescribed:  Yes. Tolerating medication:  Yes. Family/Significant othe contact made:  No, will contact:  Pt refused  Patient understands diagnosis:  Yes. Discussing patient identified problems/goals with staff:  Yes. Medical problems stabilized or resolved:  Yes. Denies suicidal/homicidal ideation: Yes. Issues/concerns per patient self-inventory:  Yes. Other:  New problem(s) identified: No, Describe:  NA  Discharge Plan or Barriers: Pt plan to stay with sister until he can get into the [facility] program. He plans to follow up with outpatient,   Reason for Continuation of Hospitalization: Depression Medication stabilization Suicidal ideation Withdrawal symptoms  Comments: Mr. Vanderloop is a long history of depression and substance use. He reports having been clean alcohol and cocaine for the past 2 years. He relapsed recently. He became depressed with poor sleep, decreased appetite, anhedonia, feeling of guilt and hopelessness worthlessness, poor energy and concentration, social isolation, crying spells, and suicidal thinking. His plan was to use cocaine to kill himself. His anxiety has increased. He also reports auditory command hallucinations urging him to hurt himself. He denies symptoms suggestive of bipolar mania. He reports heavy alcohol and cocaine use.  Estimated length of stay: Pt will discharge 10/09/2014  New goal(s):  Review of initial/current patient goals per problem list:   1.  Goal(s): Patient will participate in aftercare plan * Met:  * Target date: at discharge * As evidenced by: Patient will participate within aftercare plan AEB aftercare provider and housing plan at discharge being identified.   2.  Goal (s): Patient will exhibit decreased depressive  symptoms and suicidal ideations. * Met:  *  Target date: at discharge * As evidenced by: Patient will utilize self rating of depression at 3 or below and demonstrate decreased signs of depression or be deemed stable for discharge by MD.   3.  Goal(s): Patient will demonstrate decreased signs and symptoms of anxiety. * Met:  * Target date: at discharge * As evidenced by: Patient will utilize self rating of anxiety at 3 or below and demonstrated decreased signs of anxiety, or be deemed stable for discharge by MD   4.  Goal(s): Patient will demonstrate decreased signs of withdrawal due to substance abuse * Met:  * Target date: at discharge * As evidenced by: Patient will produce a CIWA/COWS score of 0, have stable vitals signs, and no symptoms of withdrawal.  Attendees: Patient:  Franklin [name] 10/6/20168:48 AM  Family:   10/6/20168:48 AM  Physician:  Dr. Pucilowska  10/6/20168:48 AM  Nursing:    10/6/20168:48 AM  Case Manager:   10/6/20168:48 AM  Counselor:   10/6/20168:48 AM  Other:  Wray Kearns, LCSWA 10/6/20168:48 AM  Other:   10/6/20168:48 AM  Other:   10/6/20168:48 AM  Other:  10/6/20168:48 AM  Other:  10/6/20168:48 AM  Other:  10/6/20168:48 AM  Other:  10/6/20168:48 AM  Other:  10/6/20168:48 AM  Other:  10/6/20168:48 AM  Other:   10/6/20168:48 AM   Scribe for Treatment Team:   Wray Kearns MSW, [place] , 10/10/2014, 8:48 AM

## 2014-10-10 NOTE — [facility] Suicide Risk Assessment (Signed)
[facility] INPATIENT:  Family/Significant Other Suicide Prevention Education  Suicide Prevention Education:  Patient Refusal for Family/Significant Other Suicide Prevention Education: The patient Franklin [name] has refused to provide written consent for family/significant other to be provided Family/Significant Other Suicide Prevention Education during admission and/or prior to discharge.  Physician notified.  SPE completed with pt and he was encouraged to share information provided in SPI pamphlet with support network, ask questions, and talk about any concerns relating to SPE. Pt denies SI/HI/AVH and verbalized understanding of information presented.    Ahava Kissoon L Shadasia Oldfield MSW, LCSWA  10/10/2014, 8:47 AM

## 2015-03-22 ENCOUNTER — Encounter (HOSPITAL_COMMUNITY): Payer: Self-pay

## 2015-03-22 ENCOUNTER — Emergency Department (HOSPITAL_COMMUNITY)
Admission: EM | Admit: 2015-03-22 | Discharge: 2015-03-22 | Disposition: A | Discharge status: Other Acute Inpatient Hospital | Payer: Self-pay | Attending: Physician Assistant | Admitting: Physician Assistant

## 2015-03-22 ENCOUNTER — Observation Stay (HOSPITAL_COMMUNITY)
Admission: EM | Admit: 2015-03-22 | Discharge: 2015-03-24 | Disposition: A | Discharge status: Home / Self Care | Payer: Federal, State, Local not specified - Other | Source: Intra-hospital | Attending: Psychiatry | Admitting: Psychiatry

## 2015-03-22 DIAGNOSIS — F411 Generalized anxiety disorder: Secondary | ICD-10-CM | POA: Diagnosis not present

## 2015-03-22 DIAGNOSIS — G629 Polyneuropathy, unspecified: Secondary | ICD-10-CM | POA: Diagnosis not present

## 2015-03-22 DIAGNOSIS — F1910 Other psychoactive substance abuse, uncomplicated: Secondary | ICD-10-CM | POA: Diagnosis present

## 2015-03-22 DIAGNOSIS — F17210 Nicotine dependence, cigarettes, uncomplicated: Secondary | ICD-10-CM | POA: Insufficient documentation

## 2015-03-22 DIAGNOSIS — G4700 Insomnia, unspecified: Secondary | ICD-10-CM | POA: Diagnosis not present

## 2015-03-22 DIAGNOSIS — F332 Major depressive disorder, recurrent severe without psychotic features: Principal | ICD-10-CM | POA: Diagnosis present

## 2015-03-22 DIAGNOSIS — F410 Panic disorder [episodic paroxysmal anxiety] without agoraphobia: Secondary | ICD-10-CM | POA: Insufficient documentation

## 2015-03-22 DIAGNOSIS — F1494 Cocaine use, unspecified with cocaine-induced mood disorder: Secondary | ICD-10-CM | POA: Diagnosis not present

## 2015-03-22 DIAGNOSIS — F209 Schizophrenia, unspecified: Secondary | ICD-10-CM | POA: Diagnosis not present

## 2015-03-22 DIAGNOSIS — F319 Bipolar disorder, unspecified: Secondary | ICD-10-CM | POA: Insufficient documentation

## 2015-03-22 DIAGNOSIS — Z79899 Other long term (current) drug therapy: Secondary | ICD-10-CM | POA: Insufficient documentation

## 2015-03-22 DIAGNOSIS — F1410 Cocaine abuse, uncomplicated: Secondary | ICD-10-CM | POA: Insufficient documentation

## 2015-03-22 DIAGNOSIS — K219 Gastro-esophageal reflux disease without esophagitis: Secondary | ICD-10-CM | POA: Insufficient documentation

## 2015-03-22 DIAGNOSIS — F419 Anxiety disorder, unspecified: Secondary | ICD-10-CM | POA: Insufficient documentation

## 2015-03-22 DIAGNOSIS — F1210 Cannabis abuse, uncomplicated: Secondary | ICD-10-CM | POA: Insufficient documentation

## 2015-03-22 DIAGNOSIS — F1994 Other psychoactive substance use, unspecified with psychoactive substance-induced mood disorder: Secondary | ICD-10-CM | POA: Diagnosis present

## 2015-03-22 DIAGNOSIS — R45851 Suicidal ideations: Secondary | ICD-10-CM

## 2015-03-22 LAB — COMPREHENSIVE METABOLIC PANEL
ALT: 14 U/L — ABNORMAL LOW (ref 17–63)
AST: 16 U/L (ref 15–41)
Albumin: 3.8 g/dL (ref 3.5–5.0)
Alkaline Phosphatase: 43 U/L (ref 38–126)
Anion gap: 11 (ref 5–15)
BUN: 8 mg/dL (ref 6–20)
CO2: 24 mmol/L (ref 22–32)
Calcium: 9.1 mg/dL (ref 8.9–10.3)
Chloride: 105 mmol/L (ref 101–111)
Creatinine, Ser: 0.85 mg/dL (ref 0.61–1.24)
GFR calc Af Amer: >60 mL/min (ref >60)
GFR calc non Af Amer: >60 mL/min (ref >60)
Glucose, Bld: 147 mg/dL — ABNORMAL HIGH (ref 65–99)
Potassium: 3.9 mmol/L (ref 3.5–5.1)
Sodium: 140 mmol/L (ref 135–145)
Total Bilirubin: 1.4 mg/dL — ABNORMAL HIGH (ref 0.3–1.2)
Total Protein: 6.3 g/dL — ABNORMAL LOW (ref 6.5–8.1)

## 2015-03-22 LAB — RAPID URINE DRUG SCREEN, HOSP PERFORMED
Amphetamines: NOT DETECTED
Barbiturates: NOT DETECTED
Benzodiazepines: NOT DETECTED
Cocaine: POSITIVE — AB
Opiates: NOT DETECTED
Tetrahydrocannabinol: POSITIVE — AB

## 2015-03-22 LAB — CBC WITH DIFFERENTIAL/PLATELET
Basophils Absolute: 0.1 10*3/uL (ref 0.0–0.1)
Basophils Relative: 1 %
Eosinophils Absolute: 0.2 10*3/uL (ref 0.0–0.7)
Eosinophils Relative: 3 %
HCT: 43.3 % (ref 39.0–52.0)
Hemoglobin: 15.3 g/dL (ref 13.0–17.0)
Lymphocytes Relative: 20 %
Lymphs Abs: 1.5 10*3/uL (ref 0.7–4.0)
MCH: 29.7 pg (ref 26.0–34.0)
MCHC: 35.3 g/dL (ref 30.0–36.0)
MCV: 83.9 fL (ref 78.0–100.0)
Monocytes Absolute: 0.6 10*3/uL (ref 0.1–1.0)
Monocytes Relative: 8 %
Neutro Abs: 5.2 10*3/uL (ref 1.7–7.7)
Neutrophils Relative %: 68 %
Platelets: 251 10*3/uL (ref 150–400)
RBC: 5.16 MIL/uL (ref 4.22–5.81)
RDW: 13.7 % (ref 11.5–15.5)
WBC: 7.5 10*3/uL (ref 4.0–10.5)

## 2015-03-22 LAB — ETHANOL: Alcohol, Ethyl (B): <5 mg/dL (ref <5)

## 2015-03-22 MED ORDER — HYDROXYZINE HCL 25 MG PO TABS
25.0000 mg | ORAL_TABLET | Freq: Three times a day (TID) | ORAL | Status: DC | PRN
  Administered 2015-03-23 – 2015-03-24 (×2): 25 mg via ORAL
  Filled 2015-03-22 (×2): qty 1
  Filled 2015-03-22: qty 10

## 2015-03-22 MED ORDER — ADULT MULTIVITAMIN W/MINERALS CH
1.0000 | ORAL_TABLET | Freq: Every day | ORAL | Status: DC
  Administered 2015-03-22 – 2015-03-24 (×3): 1 via ORAL
  Filled 2015-03-22: qty 1
  Filled 2015-03-22: qty 7
  Filled 2015-03-22 (×2): qty 1

## 2015-03-22 MED ORDER — QUETIAPINE FUMARATE 100 MG PO TABS
100.0000 mg | ORAL_TABLET | Freq: Every day | ORAL | Status: DC
  Administered 2015-03-23: 100 mg via ORAL
  Filled 2015-03-22: qty 1
  Filled 2015-03-22: qty 7

## 2015-03-22 MED ORDER — GABAPENTIN 100 MG PO CAPS
200.0000 mg | ORAL_CAPSULE | Freq: Three times a day (TID) | ORAL | Status: DC
  Administered 2015-03-22 – 2015-03-24 (×6): 200 mg via ORAL
  Filled 2015-03-22: qty 42
  Filled 2015-03-22 (×7): qty 2

## 2015-03-22 MED ORDER — PANTOPRAZOLE SODIUM 40 MG PO TBEC
40.0000 mg | DELAYED_RELEASE_TABLET | Freq: Every day | ORAL | Status: DC
  Administered 2015-03-23 – 2015-03-24 (×2): 40 mg via ORAL
  Filled 2015-03-22 (×2): qty 1

## 2015-03-22 MED ORDER — MAGNESIUM HYDROXIDE 400 MG/5ML PO SUSP: 30.0000 mL | Freq: Every day | ORAL | Status: DC | PRN

## 2015-03-22 MED ORDER — ALUM & MAG HYDROXIDE-SIMETH 200-200-20 MG/5ML PO SUSP: 30.0000 mL | ORAL | Status: DC | PRN

## 2015-03-22 MED ORDER — ACETAMINOPHEN 325 MG PO TABS: 650.0000 mg | ORAL_TABLET | Freq: Four times a day (QID) | ORAL | Status: DC | PRN

## 2015-03-22 NOTE — ED Notes (Signed)
Belongings given to pelham.

## 2015-03-22 NOTE — Progress Notes (Addendum)
Admission note: Franklin [name] was admitted to obs unit because he cannot go back to his sober living house unless he has proof that he has detoxed. He reports last using crack and pot yesterday. UDS positive for same. Pt also was sober from alcohol for five months before drinking a six pack yesterday. Pt expressed SI without a plan but contracts for safety while on unit. He denies HI/AVH/pain. Pt is polite and cooperative upon admission. Skin assessment unremarkable. Pt's belongings inventoried and pt was oriented to obs unit. Pt expressed remorse for relapse but said he was hopeful that he could again find sobriety. Will continue to monitor for needs/safety.

## 2015-03-22 NOTE — BH Assessment (Signed)
Consulted with Dr. Jannifer FranklinAkintayo and Dahlia ByesJosephine Onuoha, PMH-NP who recommend treatment in observation at this time. Contacted Donnald GarreShaletta Forrest, RN, [facility] to request [facility] bed.   Davina PokeJoVea Teleshia Lemere, LCSW Therapeutic Triage Specialist [facility] 03/22/2015 1:01 PM

## 2015-03-22 NOTE — ED Provider Notes (Addendum)
CSN: [id number]     Arrival date & time 03/22/15  1003 History   First MD Initiated Contact with Patient 03/22/15 1032     Chief Complaint  Patient presents with  . detox    . Suicidal     (Consider location/radiation/quality/duration/timing/severity/associated sxs/prior Treatment) HPI   She is a 45 year old male presenting here for detox. Patient has been clean since October. He reports 1 day of drinking and using crack cocaine. He has no somatic symptoms at this time. Patient is here for detox.  When told that we do not offer detox he said "you calling me a liar?". He says he got detox here previously. I informed him that it was likely because he was suicidal or depressed and her required inpatient admission for that. He states now that "I will be suicidal depressed if not able to get detox". Patient requesting to see psychiatry now. I talked to patient about how he is only been non-sober for 1 day so he is not likely withdrawl will likely not need with treatment for withdrawal, detox. He states that his program in [place] we'll let him back in.  Past Medical History  Diagnosis Date  . Depression   . Anxiety   . Bipolar 1 disorder (HCC)   . No pertinent past medical history   . Schizophrenia [facility])    Past Surgical History  Procedure Laterality Date  . No past surgeries     Family History  Problem Relation Age of Onset  . Hypertension Other   . Diabetes Other    Social History  Substance Use Topics  . Smoking status: Current Every Day Smoker -- 1.00 packs/day    Types: Cigarettes  . Smokeless tobacco: Never Used  . Alcohol Use: 7.2 oz/week    12 Cans of beer per week     Comment: He drinks one 12 pack of beer per day. There is a long history of alcohol use.    Review of Systems  Constitutional: Negative for activity change.  HENT: Negative for congestion.   Respiratory: Negative for shortness of breath.   Cardiovascular: Negative for chest pain.  Gastrointestinal:  Negative for abdominal pain.  Psychiatric/Behavioral: Positive for agitation. The patient is nervous/anxious.       Allergies  Review of patient's allergies indicates no known allergies.  Home Medications   Prior to Admission medications   Medication Sig Start Date End Date Taking? Authorizing Provider  acetaminophen (TYLENOL) 325 MG tablet Take 650 mg by mouth every 6 (six) hours as needed for mild pain.   Yes Historical Provider, MD  buPROPion (WELLBUTRIN XL) 150 MG 24 hr tablet Take 1 tablet (150 mg total) by mouth daily. Patient not taking: Reported on 03/22/2015 10/07/14   Shari Prows, MD  gabapentin (NEURONTIN) 100 MG capsule Take 2 capsules (200 mg total) by mouth 3 (three) times daily. 10/07/14   Shari Prows, MD  hydrOXYzine (ATARAX/VISTARIL) 25 MG tablet Take 1 tablet (25 mg total) by mouth 3 (three) times daily as needed for anxiety. 10/07/14   Shari Prows, MD  Multiple Vitamin (MULTIVITAMIN WITH MINERALS) TABS tablet Take 1 tablet by mouth daily. Patient not taking: Reported on 03/22/2015 10/02/14   Charm Rings, NP  pantoprazole (PROTONIX) 40 MG tablet Take 1 tablet (40 mg total) by mouth daily before breakfast. 10/07/14   Shari Prows, MD  QUEtiapine (SEROQUEL) 100 MG tablet Take 1 tablet (100 mg total) by mouth at bedtime. 10/07/14   Jolanta  B Pucilowska, MD   BP 147/87 mmHg  Pulse 88  Temp(Src) 97.7 F (36.5 C) (Oral)  Resp 16  SpO2 98% Physical Exam  Constitutional: He is oriented to person, place, and time. He appears well-nourished.  HENT:  Head: Normocephalic.  Mouth/Throat: Oropharynx is clear and moist.  Eyes: Conjunctivae are normal.  Neck: No tracheal deviation present.  Cardiovascular: Normal rate.   Pulmonary/Chest: Effort normal. No stridor. No respiratory distress.  Abdominal: Soft. There is no tenderness. There is no guarding.  Musculoskeletal: Normal range of motion. He exhibits no edema.  Neurological: He is oriented  to person, place, and time. No cranial nerve deficit.  Skin: Skin is warm and dry. No rash noted. He is not diaphoretic.  Psychiatric:  Endorsing suicide ideation.  Nursing note and vitals reviewed.   ED Course  Procedures (including critical care time) Labs Review Labs Reviewed  CBC WITH DIFFERENTIAL/PLATELET  COMPREHENSIVE METABOLIC PANEL  ETHANOL  URINE RAPID DRUG SCREEN, HOSP PERFORMED    Imaging Review No results found. I have personally reviewed and evaluated these images and lab results as part of my medical decision-making.   EKG Interpretation   Date/Time:  Saturday March 22 2015 11:04:39 EDT Ventricular Rate:  77 PR Interval:  143 QRS Duration: 87 QT Interval:  363 QTC Calculation: 411 R Axis:   75 Text Interpretation:  Sinus rhythm No significant change since last  tracing Confirmed by Kandis MannanMACKUEN, COURTNEY ([id number]) on 03/22/2015 11:14:53 AM      MDM   Final diagnoses:  None    Patient is a 45 year old male presenting here for detox. As discussed in history of present illness he is told to detox is not offered here. He now reports that he might be suicidal. We'll consult TTS. Likely patient could be given outpatient resources however now endorsing SI.  He has had serious SI in the past, so I want to take his endorsement seriously.  Will consult TTS.   Bradly Sangiovanni Randall AnLyn Klint Lezcano, MD 03/22/15 1039  Chasity Outten Randall AnLyn Taura Lamarre, MD 03/22/15 1115

## 2015-03-22 NOTE — ED Notes (Signed)
Pt reports [facility] has [facility] [place] for his etoh abuse. Since he relapsed, they told him to get detox again.

## 2015-03-22 NOTE — BH Assessment (Signed)
Patient has been accepted to Observation per Simonne ComeLeo. Informed EDp contact nurse for report at [phone] or [phone].  Davina PokeJoVea Ticara Waner, LCSW Therapeutic Triage Specialist [facility] 03/22/2015 1:30 PM

## 2015-03-22 NOTE — BH Assessment (Signed)
[facility] Assessment Progress Note  Patient was seen on admission by Mahati Vajda LCAS. Patient stated they have been residing in a sober living house since October in [place] and came up to [place] yesterday to visit a girlfriend. Patient stated upon arrival the person he was coming to visit decided not to see patient. Patient stated he was so frustrated that he relapsed on cocaine. Patient reported using over $200.00 dollars on crack/cocaine. Patient is very remorseful in reference to relapsing. Patient reports some thoughts of self-harm but no plan. [facility] with the last one on 10/05/14. Patient is not currently on any MH medications but is open to medication interventions to reduce depression. Patient will be seen later this date by extender on call

## 2015-03-22 NOTE — ED Notes (Signed)
MD at bedside. 

## 2015-03-22 NOTE — Progress Notes (Signed)
[facility] INPATIENT:  Family/Significant Other Suicide Prevention Education  Suicide Prevention Education:  Patient Refusal for Family/Significant Other Suicide Prevention Education: The patient Franklin [name] has refused to provide written consent for family/significant other to be provided Family/Significant Other Suicide Prevention Education during admission and/or prior to discharge.  Physician notified.  Franklin [name], Franklin [name] M 03/22/2015, 5:19 PM

## 2015-03-22 NOTE — ED Notes (Signed)
EDP went in to speak with pt regarding detox, pt states he has only drinking one day, when EDP explained the detox program he proceeded to state he is suicidal without a plan. EDP ordering TTS.

## 2015-03-22 NOTE — [facility] Counselor (Signed)
Patient reviewed and signed voluntary consent to treat and ROI to no one. Patient denies any questions or concerns. [facility]. Patients paperwork given to nurse for transport.   Davina PokeJoVea Karthik Whittinghill, LCSW Therapeutic Triage Specialist [facility] 03/22/2015 2:45 PM

## 2015-03-22 NOTE — BH Assessment (Addendum)
Assessment Note  Franklin SolianSteven [name] is an 45 y.o. male presenting to [facility] for detox. Patient states that he lives [facility] which is a sober living house in [place]. Patient states that he has lived there since October but relapsed yesterday. Patient states that he is not able to go back unless he has proof that he has had detox.  Patient endorses Si with no plan after the EDP told him that we no longer offer detox.  Patient states that he is suicidal with no plan during the assessment. Patient denies self-injurious behavior. Patient states that he has attempted suicide in the past and the last time was about two years ago. Patient will not disclose his recent stressors but states that he is "stressed." Patient endorses symptoms of depression as insomnia, fatigue, loss of interest in pleasurable activities, guilt, and isolating. Patient denies HI and history of being violent towards others.  Patient denies AVH but states that he has "heard random voices" in the past. Patient states that he is not hearing voices at this time.  Patient states that he uses "about $100" of "crack" two to three times per week and last used yesterday.  He states that he was sober from alcohol for five months and relapsed yesterday drinking a six pack. Patient states that he smokes THC "from time to time" and last smoked yesterday.  Patient UDS + cocaine and THC. BAL <5.    Consulted with Dr. Jannifer FranklinAkintayo who recommends treatment in the observation unit at this time.    Diagnosis: Cocaine-induced depressive disorder, With mild use disorder  Past Medical History:  Past Medical History  Diagnosis Date  . Depression   . Anxiety   . Bipolar 1 disorder (HCC)   . No pertinent past medical history   . Schizophrenia [facility](HCC)     Past Surgical History  Procedure Laterality Date  . No past surgeries      Family History:  Family History  Problem Relation Age of Onset  . Hypertension Other   . Diabetes Other     Social History:   reports that he has been smoking Cigarettes.  He has been smoking about 1.00 pack per day. He has never used smokeless tobacco. He reports that he drinks about 7.2 oz of alcohol per week. He reports that he uses illicit drugs ("Crack" cocaine and Cocaine) about twice per week.  Additional Social History:  Alcohol / Drug Use Pain Medications: See PTA Prescriptions: See PTA Over the Counter: See PTA History of alcohol / drug use?: Yes Longest period of sobriety (when/how long): 5 months Substance #1 Name of Substance 1: "crack" 1 - Age of First Use: 18 1 - Amount (size/oz): $100 worth 1 - Frequency: 2-3 times per week 1 - Duration: ongoing 1 - Last Use / Amount: yesterday $100 Substance #2 Name of Substance 2: Alcohol 2 - Age of First Use: 18 2 - Amount (size/oz): 1 case of beer 2 - Frequency: yesterday (sober for five months) 2 - Duration: relapsed yesterday after 5 months 2 - Last Use / Amount: yesterday Substance #3 Name of Substance 3: THC 3 - Age of First Use: 18 3 - Amount (size/oz): "not a lot" 3 - Frequency: "not often" 3 - Duration: ongoing 3 - Last Use / Amount: yesterday one blunt  CIWA: CIWA-Ar BP: 147/87 mmHg Pulse Rate: 88 Nausea and Vomiting: mild nausea with no vomiting Tactile Disturbances: none Tremor: no tremor Auditory Disturbances: very mild harshness or ability to frighten Paroxysmal Sweats:  no sweat visible Visual Disturbances: very mild sensitivity Anxiety: two Headache, Fullness in Head: none present Agitation: normal activity Orientation and Clouding of Sensorium: oriented and can do serial additions CIWA-Ar Total: 5 COWS:    Allergies: No Known Allergies  Home Medications:  (Not in a hospital admission)  OB/GYN Status:  No LMP for male patient.  General Assessment Data [facility] of [facility]: [facility] TTS Assessment: In system Is this a Tele or Face-to-Face Assessment?: Face-to-Face Is this an Initial Assessment or a Re-assessment for  this encounter?: Initial Assessment Marital status: Single Is patient pregnant?: No Pregnancy Status: No Living Arrangements: Non-relatives/Friends (treatment center roommates) Can pt return to current living arrangement?: Yes ("but I have to have proof of detox") Admission Status: Voluntary Is patient capable of signing voluntary admission?: Yes Referral Source: Self/Family/Friend     Crisis Care Plan Living Arrangements: Non-relatives/Friends (treatment center roommates) Name of Psychiatrist: None Name of Therapist: None  Education Status Is patient currently in school?: No Highest grade of school patient has completed: 2 years of college  Risk to self with the past 6 months Suicidal Ideation: Yes-Currently Present Has patient been a risk to self within the past 6 months prior to admission? : No Suicidal Intent: No Has patient had any suicidal intent within the past 6 months prior to admission? : No Is patient at risk for suicide?: No Suicidal Plan?: No Has patient had any suicidal plan within the past 6 months prior to admission? : No Access to Means: No What has been your use of drugs/alcohol within the last 12 months?: Alcohol, THC, Crack Previous Attempts/Gestures: Yes How many times?: 1 (2 years ago) Other Self Harm Risks: Denies Triggers for Past Attempts: Other (Comment) ("depression") Intentional Self Injurious Behavior: None Family Suicide History: No Recent stressful life event(s): Other (Comment) ("just a lot") Persecutory voices/beliefs?: No Depression: Yes Depression Symptoms: Insomnia, Isolating, Fatigue, Guilt, Loss of interest in usual pleasures Substance abuse history and/or treatment for substance abuse?: Yes (last year) Suicide prevention information given to non-admitted patients: Not applicable  Risk to Others within the past 6 months Homicidal Ideation: No Does patient have any lifetime risk of violence toward others beyond the six months prior to  admission? : No Thoughts of Harm to Others: No Current Homicidal Intent: No Current Homicidal Plan: No Access to Homicidal Means: No Identified Victim: Denies History of harm to others?: No Assessment of Violence: None Noted Violent Behavior Description: Denies Does patient have access to weapons?: No Criminal Charges Pending?: No Does patient have a court date: No Is patient on probation?: No  Psychosis Hallucinations:  (voices "random stuff" in the past _it varies) Delusions: None noted  Mental Status Report Appearance/Hygiene: Unremarkable Eye Contact: Fair Motor Activity: Unable to assess Speech: Logical/coherent Level of Consciousness: Alert Mood: Pleasant Affect: Appropriate to circumstance Anxiety Level: None Thought Processes: Coherent, Relevant Judgement: Unimpaired Orientation: Person, Place, Time, Situation, Appropriate for developmental age Obsessive Compulsive Thoughts/Behaviors: None  Cognitive Functioning Concentration: Decreased Memory: Recent Intact, Remote Intact IQ: Average Insight: Fair Impulse Control: Fair Appetite:  ("it varies") Sleep: Decreased Total Hours of Sleep:  ("it varies")  ADLScreening [facility]) Patient's cognitive ability adequate to safely complete daily activities?: Yes Patient able to express need for assistance with ADLs?: Yes Independently performs ADLs?: Yes (appropriate for developmental age)  Prior Inpatient Therapy Prior Inpatient Therapy: Yes Prior Therapy Dates: 2016 Prior Therapy Facilty/Provider(s): [facility] Reason for Treatment: Depression  Prior Outpatient Therapy Prior Outpatient Therapy: Yes Prior Therapy  Dates: 2016 Prior Therapy Facilty/Provider(s): [facility] Reason for Treatment: Depression Does patient have an ACCT team?: No Does patient have Intensive In-House Services?  : No Does patient have [facility] services? : No Does patient have P4CC services?: No  ADL Screening (condition at time of  admission) Patient's cognitive ability adequate to safely complete daily activities?: Yes Is the patient deaf or have difficulty hearing?: No Does the patient have difficulty seeing, even when wearing glasses/contacts?: No Does the patient have difficulty concentrating, remembering, or making decisions?: No Patient able to express need for assistance with ADLs?: Yes Does the patient have difficulty dressing or bathing?: No Independently performs ADLs?: Yes (appropriate for developmental age) Does the patient have difficulty walking or climbing stairs?: No Weakness of Legs: None Weakness of Arms/Hands: None  Home Assistive Devices/Equipment Home Assistive Devices/Equipment: None    Abuse/Neglect Assessment (Assessment to be complete while patient is alone) Physical Abuse: Denies Verbal Abuse: Denies Sexual Abuse: Denies Exploitation of patient/patient's resources: Denies Self-Neglect: Denies Values / Beliefs Cultural Requests During Hospitalization: None Spiritual Requests During Hospitalization: None Consults Spiritual Care Consult Needed: No Social Work Consult Needed: No Merchant navy officer (For Healthcare) Does patient have an advance directive?: No Would patient like information on creating an advanced directive?: No - patient declined information    Additional Information 1:1 In Past 12 Months?: No CIRT Risk: No Elopement Risk: No Does patient have medical clearance?: Yes     Disposition:  Disposition Initial Assessment Completed for this Encounter: Yes Disposition of Patient: Other dispositions (Observation unit per Dr. Jannifer Franklin) Other disposition(s): Other (Comment) (Observation unit per Dr. Jannifer Franklin)  On Site Evaluation by:   Reviewed with Physician:    Janaiyah Blackard 03/22/2015 1:09 PM

## 2015-03-22 NOTE — ED Notes (Signed)
Pt requests detox from etoh and drugs. Last drink etoh last night. Pt takes crack and marijuana. Last use of crack yesterday. Denies SI/HI.

## 2015-03-23 ENCOUNTER — Encounter (HOSPITAL_COMMUNITY): Payer: Self-pay | Admitting: Registered Nurse

## 2015-03-23 DIAGNOSIS — F411 Generalized anxiety disorder: Secondary | ICD-10-CM | POA: Diagnosis not present

## 2015-03-23 DIAGNOSIS — F1994 Other psychoactive substance use, unspecified with psychoactive substance-induced mood disorder: Secondary | ICD-10-CM

## 2015-03-23 DIAGNOSIS — F332 Major depressive disorder, recurrent severe without psychotic features: Secondary | ICD-10-CM | POA: Diagnosis not present

## 2015-03-23 MED ORDER — NICOTINE POLACRILEX 2 MG MT GUM
2.0000 mg | CHEWING_GUM | OROMUCOSAL | Status: DC | PRN
  Administered 2015-03-23: 2 mg via ORAL
  Filled 2015-03-23: qty 1

## 2015-03-23 MED ORDER — SERTRALINE HCL 25 MG PO TABS
25.0000 mg | ORAL_TABLET | Freq: Every day | ORAL | Status: DC
  Administered 2015-03-23 – 2015-03-24 (×2): 25 mg via ORAL
  Filled 2015-03-23: qty 1
  Filled 2015-03-23: qty 7
  Filled 2015-03-23: qty 1

## 2015-03-23 NOTE — H&P (Signed)
Psychiatric Admission Assessment Adult  Patient Identification: Franklin [name] MRN:  [id number] Date of Evaluation:  03/23/2015 Chief Complaint:  Cocaine Induced Depressive Disorder Principal Diagnosis: Major depressive disorder, recurrent, severe without psychotic features ([place]) Diagnosis:   Patient Active Problem List   Diagnosis Date Noted  . Substance abuse [F19.10] 03/22/2015  . Tobacco use disorder [F17.200] 10/06/2014  . Major depressive disorder, recurrent episode, severe, with psychotic behavior ([place]) [F33.3] 10/05/2014  . Cocaine use disorder, severe, dependence ([place]) [F14.20] 10/02/2014  . Major depressive disorder, recurrent, severe without psychotic features ([place]) [F33.2]   . Alcohol use disorder, severe, dependence ([place]) [F10.20] 06/18/2014  . Alcohol dependence with alcohol-induced disorder ([place]) [F10.29] 05/22/2013  . Substance induced mood disorder ([place]) [F19.94] 03/14/2013  . Polysubstance abuse [F19.10] 04/08/2012   History of Present Illness:: Patient admitted to [facility] Observation unit after his presentation to [facility] requesting detox and complaints of worsening anxiety and depression.  Patient states that he resides in the [facility] but is able to come and go.  States that he has a history of depression but has always neglected to take medications; reports that he has been doing well since he has been in the [facility] but at all times has the recurring thoughts of death or feeling that things would be better if he wasn't here; "on a daily basis I have to fight to see life; at first I thought it was because I was on drugs and stuff but now I have a place to stay, car, and job; but I'm still having the same feelings."  Patient also reports that he wants to start medication now for his depression/anxiety.  At this time patient denies suicidal/homicidal ideation, psychosis, and paranoia.  States that he will be going back to the [facility].   Informed that we would start on  medication; observer over night and discharge in the morning to follow up with [facility] who also offers therapy and can arrange medication management.   Associated Signs/Symptoms: Depression Symptoms:  depressed mood, anhedonia, fatigue, recurrent thoughts of death, anxiety, (Hypo) Manic Symptoms:  Distractibility, Impulsivity, Anxiety Symptoms:  Excessive Worry, Psychotic Symptoms:  Denies PTSD Symptoms: Denies Total Time spent with patient: 30 minutes  Past Psychiatric History: Prior history of Depression, Anxiety, Substance abuse.  Has been prescribed psychotropics in past but states that he never really took them Wellbutrin; Neurontin; and Vistaril.  Currently living at the [facility] in [place].    Is the patient at risk to self? No.  Has the patient been a risk to self in the past 6 months? No.  Has the patient been a risk to self within the distant past? No.  Is the patient a risk to others? No.  Has the patient been a risk to others in the past 6 months? No.  Has the patient been a risk to others within the distant past? No.   Prior Inpatient Therapy:  Last [facility] 06/16 inpatient and OBS 09/2015; [facility] 10/16 Prior Outpatient Therapy:  yes  Alcohol Screening: 1. How often do you have a drink containing alcohol?: 2 to 4 times a month 2. How many drinks containing alcohol do you have on a typical day when you are drinking?: 5 or 6 3. How often do you have six or more drinks on one occasion?: Weekly Preliminary Score: 5 4. How often during the last year have you found that you were not able to stop drinking once you had started?: Daily or almost  daily 5. How often during the last year have you failed to do what was normally expected from you becasue of drinking?: Weekly 6. How often during the last year have you needed a first drink in the morning to get yourself going after a heavy drinking session?: Weekly 7. How often during the last year have you had a feeling of guilt  of remorse after drinking?: Weekly 8. How often during the last year have you been unable to remember what happened the night before because you had been drinking?: Weekly 9. Have you or someone else been injured as a result of your drinking?: No 10. Has a relative or friend or a doctor or another health worker been concerned about your drinking or suggested you cut down?: Yes, but not in the last year Alcohol Use Disorder Identification Test Final Score (AUDIT): 25 Brief Intervention: Patient declined brief intervention Substance Abuse History in the last 12 months:  No. Consequences of Substance Abuse: Family Consequences:  Family discord Withdrawal Symptoms:   Diaphoresis Nausea Tremors Previous Psychotropic Medications: Yes  Psychological Evaluations: No  Past Medical History:  Past Medical History  Diagnosis Date  . Depression   . Anxiety   . Bipolar 1 disorder ([place])   . No pertinent past medical history   . Schizophrenia [facility])     Past Surgical History  Procedure Laterality Date  . No past surgeries     Family History:  Family History  Problem Relation Age of Onset  . Hypertension Other   . Diabetes Other    Family Psychiatric  History: Denies Tobacco Screening: _0 [phone])::1)@ Social History:  History  Alcohol Use  . 7.2 oz/week  . 12 Cans of beer per week    Comment: He drinks one 12 pack of beer per day. There is a long history of alcohol use.     History  Drug Use  . 2.00 per week  . Special: "Crack" cocaine, Cocaine    Comment: He smokes cocaine twice a week    Additional Social History:  Allergies:  No Known Allergies Lab Results:  Results for orders placed or performed during the hospital encounter of 03/22/15 (from the past 48 hour(s))  Comprehensive metabolic panel     Status: Abnormal   Collection Time: 03/22/15 10:47 AM  Result Value Ref Range   Sodium 140 135 - 145 mmol/L   Potassium 3.9 3.5 - 5.1 mmol/L   Chloride 105 101 - 111  mmol/L   CO2 24 22 - 32 mmol/L   Glucose, Bld 147 (H) 65 - 99 mg/dL   BUN 8 6 - 20 mg/dL   Creatinine, Ser 0.85 0.61 - 1.24 mg/dL   Calcium 9.1 8.9 - 10.3 mg/dL   Total Protein 6.3 (L) 6.5 - 8.1 g/dL   Albumin 3.8 3.5 - 5.0 g/dL   AST 16 15 - 41 U/L   ALT 14 (L) 17 - 63 U/L   Alkaline Phosphatase 43 38 - 126 U/L   Total Bilirubin 1.4 (H) 0.3 - 1.2 mg/dL   GFR calc non Af Amer >60 >60 mL/min   GFR calc Af Amer >60 >60 mL/min    Comment: (NOTE) The eGFR has been calculated using the CKD EPI equation. This calculation has not been validated in all clinical situations. eGFR's persistently <60 mL/min signify possible Chronic Kidney Disease.    Anion gap 11 5 - 15  Ethanol     Status: None   Collection Time: 03/22/15 10:47 AM  Result  Value Ref Range   Alcohol, Ethyl (B) <5 <5 mg/dL    Comment:        LOWEST DETECTABLE LIMIT FOR SERUM ALCOHOL IS 5 mg/dL FOR MEDICAL PURPOSES ONLY   CBC with Diff     Status: None   Collection Time: 03/22/15 10:47 AM  Result Value Ref Range   WBC 7.5 4.0 - 10.5 K/uL   RBC 5.16 4.22 - 5.81 MIL/uL   Hemoglobin 15.3 13.0 - 17.0 g/dL   HCT 43.3 39.0 - 52.0 %   MCV 83.9 78.0 - 100.0 fL   MCH 29.7 26.0 - 34.0 pg   MCHC 35.3 30.0 - 36.0 g/dL   RDW 13.7 11.5 - 15.5 %   Platelets 251 150 - 400 K/uL   Neutrophils Relative % 68 %   Neutro Abs 5.2 1.7 - 7.7 K/uL   Lymphocytes Relative 20 %   Lymphs Abs 1.5 0.7 - 4.0 K/uL   Monocytes Relative 8 %   Monocytes Absolute 0.6 0.1 - 1.0 K/uL   Eosinophils Relative 3 %   Eosinophils Absolute 0.2 0.0 - 0.7 K/uL   Basophils Relative 1 %   Basophils Absolute 0.1 0.0 - 0.1 K/uL  Urine rapid drug screen (hosp performed)not at [facility]     Status: Abnormal   Collection Time: 03/22/15 11:00 AM  Result Value Ref Range   Opiates NONE DETECTED NONE DETECTED   Cocaine POSITIVE (A) NONE DETECTED   Benzodiazepines NONE DETECTED NONE DETECTED   Amphetamines NONE DETECTED NONE DETECTED   Tetrahydrocannabinol POSITIVE (A)  NONE DETECTED   Barbiturates NONE DETECTED NONE DETECTED    Comment:        DRUG SCREEN FOR MEDICAL PURPOSES ONLY.  IF CONFIRMATION IS NEEDED FOR ANY PURPOSE, NOTIFY LAB WITHIN 5 DAYS.        LOWEST DETECTABLE LIMITS FOR URINE DRUG SCREEN Drug Class       Cutoff (ng/mL) Amphetamine      1000 Barbiturate      200 Benzodiazepine   092 Tricyclics       330 Opiates          300 Cocaine          300 THC              50     Blood Alcohol level:  Lab Results  Component Value Date   ETH <5 03/22/2015   ETH <5 07/62/2633    Metabolic Disorder Labs:  Lab Results  Component Value Date   HGBA1C 5.5 10/08/2014   MPG 117 02/02/2009   No results found for: PROLACTIN Lab Results  Component Value Date   CHOL 159 10/08/2014   TRIG 255* 10/08/2014   HDL 45 10/08/2014   CHOLHDL 3.5 10/08/2014   VLDL 51* 10/08/2014   LDLCALC 63 10/08/2014   LDLCALC  07/27/2009    83        Total Cholesterol/HDL:CHD Risk Coronary Heart Disease Risk Table                     Men   Women  1/2 Average Risk   3.4   3.3  Average Risk       5.0   4.4  2 X Average Risk   9.6   7.1  3 X Average Risk  23.4   11.0        Use the calculated Patient Ratio above and the CHD Risk Table to determine the patient's CHD Risk.  ATP III CLASSIFICATION (LDL):  <100     mg/dL   Optimal  100-129  mg/dL   Near or Above                    Optimal  130-159  mg/dL   Borderline  160-189  mg/dL   High  >190     mg/dL   Very High    Current Medications: Current Facility-Administered Medications  Medication Dose Route Frequency Provider Last Rate Last Dose  . acetaminophen (TYLENOL) tablet 650 mg  650 mg Oral Q6H PRN Kerrie Buffalo, NP      . alum & mag hydroxide-simeth (MAALOX/MYLANTA) 200-200-20 MG/5ML suspension 30 mL  30 mL Oral Q4H PRN Kerrie Buffalo, NP      . gabapentin (NEURONTIN) capsule 200 mg  200 mg Oral TID Shuvon B Rankin, NP   200 mg at 03/23/15 1417  . hydrOXYzine (ATARAX/VISTARIL) tablet 25  mg  25 mg Oral TID PRN Shuvon B Rankin, NP   25 mg at 03/23/15 1417  . magnesium hydroxide (MILK OF MAGNESIA) suspension 30 mL  30 mL Oral Daily PRN Kerrie Buffalo, NP      . multivitamin with minerals tablet 1 tablet  1 tablet Oral Daily Shuvon B Rankin, NP   1 tablet at 03/23/15 0828  . pantoprazole (PROTONIX) EC tablet 40 mg  40 mg Oral QAC breakfast Shuvon B Rankin, NP   40 mg at 03/23/15 0621  . QUEtiapine (SEROQUEL) tablet 100 mg  100 mg Oral QHS Shuvon B Rankin, NP   100 mg at 03/22/15 2101  . sertraline (ZOLOFT) tablet 25 mg  25 mg Oral Daily Shuvon B Rankin, NP       PTA Medications: Prescriptions prior to admission  Medication Sig Dispense Refill Last Dose  . acetaminophen (TYLENOL) 325 MG tablet Take 650 mg by mouth every 6 (six) hours as needed for mild pain.   Past Week at Unknown time  . buPROPion (WELLBUTRIN XL) 150 MG 24 hr tablet Take 1 tablet (150 mg total) by mouth daily. (Patient not taking: Reported on 03/22/2015) 30 tablet 0 Not Taking  . gabapentin (NEURONTIN) 100 MG capsule Take 2 capsules (200 mg total) by mouth 3 (three) times daily. 180 capsule 0   . hydrOXYzine (ATARAX/VISTARIL) 25 MG tablet Take 1 tablet (25 mg total) by mouth 3 (three) times daily as needed for anxiety. 60 tablet 0   . Multiple Vitamin (MULTIVITAMIN WITH MINERALS) TABS tablet Take 1 tablet by mouth daily. (Patient not taking: Reported on 03/22/2015) 30 tablet 0 Not Taking at Unknown time  . pantoprazole (PROTONIX) 40 MG tablet Take 1 tablet (40 mg total) by mouth daily before breakfast. 30 tablet 0   . QUEtiapine (SEROQUEL) 100 MG tablet Take 1 tablet (100 mg total) by mouth at bedtime. 30 tablet 0     Musculoskeletal: Strength & Muscle Tone: within normal limits Gait & Station: normal Patient leans: N/A  Psychiatric Specialty Exam: Physical Exam  Constitutional: He is oriented to person, place, and time.  Neck: Normal range of motion.  Respiratory: Effort normal.  Musculoskeletal: Normal  range of motion.  Neurological: He is alert and oriented to person, place, and time. Coordination and gait normal.  Skin: Skin is warm and dry.  Psychiatric: His speech is normal and behavior is normal. His mood appears anxious. Thought content is not paranoid and not delusional. Cognition and memory are normal. He expresses impulsivity. He exhibits a depressed mood. He expresses  no homicidal and no suicidal ideation.    Review of Systems  Psychiatric/Behavioral: Positive for depression and substance abuse. Negative for hallucinations. Suicidal ideas: Denies at this time. The patient is nervous/anxious. The patient does not have insomnia.   All other systems reviewed and are negative.   Blood pressure 118/66, pulse 66, temperature 98.4 F (36.9 C), temperature source Oral, resp. rate 14, height _0  (1.778 m), weight 79.379 kg (175 lb), SpO2 95 %.Body mass index is 25.11 kg/(m^2).  General Appearance: Casual  Eye Contact::  Good  Speech:  Clear and Coherent and Normal Rate  Volume:  Normal  Mood:  Anxious and Depressed  Affect:  Congruent  Thought Process:  Circumstantial and Goal Directed  Orientation:  Full (Time, Place, and Person)  Thought Content:  Denies hallucinations; delusions, and paranoia  Suicidal Thoughts:  Denies at this time  Homicidal Thoughts:  No  Memory:  Immediate;   Good Recent;   Fair Remote;   Fair  Judgement:  Fair  Insight:  Present  Psychomotor Activity:  Normal  Concentration:  Fair  Recall:  Good  Fund of Knowledge:Good  Language: Good  Akathisia:  No  Handed:  Right  AIMS (if indicated):     Assets:  Communication Skills Desire for Improvement Housing Physical Health Social Support  ADL's:  Intact  Cognition: WNL  Sleep:        Treatment Plan Summary: Medication management and Plan 24 hours Observation  Observation Level/Precautions:  15 minute checks for safety  Laboratory:  CBC Chemistry Profile UDS UA ED labs reviewed   Psychotherapy:  As appropriate  Medications:  Depression/Anxiety:  Will start Zoloft 25 mg daily; Anxiety:  Will start Vistaril 25 mg Tid prn; Insomnia/Mood control: Seroquel 100 mg Q hs;   Consultations:  As appropriate  Discharge Concerns:  Safety, stabilization, and access to medication  Estimated LOS: 24 hours  Other:     Disposition:  TTS/SW to contact [facility] to set up outpatient services for medication management/therapy.  Patient to be discharged Monday morning once follow up has been done.    I certify that inpatient services furnished can reasonably be expected to improve the patient's condition.    Earleen Newport, NP 3/19/20173:49 PM

## 2015-03-23 NOTE — Discharge Instructions (Signed)
[facility]

## 2015-03-23 NOTE — Progress Notes (Signed)
Patient has been extremely sleepy throughout day. He denies any pain, SI, HI, or AVH.   Patient safety maintained through direct observation; support and encouragement.   Patient compliant with treatment, will continue to monitor.

## 2015-03-23 NOTE — Progress Notes (Signed)
Patient refused his bedtime medication - Seroquel stated "I don't take that anymore. I feel better off without taking them". Watched TV most of the night and went to bed. Patient made no new complaint.  Patient encouraged to discuss his medication with the dr. tomorrow. Contininous observation for safety maintained. Will continue to monitor patient for safety and stability.

## 2015-03-24 DIAGNOSIS — F332 Major depressive disorder, recurrent severe without psychotic features: Secondary | ICD-10-CM | POA: Diagnosis not present

## 2015-03-24 DIAGNOSIS — F1910 Other psychoactive substance abuse, uncomplicated: Secondary | ICD-10-CM

## 2015-03-24 MED ORDER — GABAPENTIN 100 MG PO CAPS: 200.0000 mg | ORAL_CAPSULE | Freq: Three times a day (TID) | ORAL | Status: DC

## 2015-03-24 MED ORDER — LORATADINE 10 MG PO TABS
10.0000 mg | ORAL_TABLET | Freq: Every day | ORAL | Status: DC
  Administered 2015-03-24: 10 mg via ORAL
  Filled 2015-03-24: qty 1

## 2015-03-24 MED ORDER — ADULT MULTIVITAMIN W/MINERALS CH: 1.0000 | ORAL_TABLET | Freq: Every day | ORAL | Status: DC

## 2015-03-24 MED ORDER — SERTRALINE HCL 25 MG PO TABS: 25.0000 mg | ORAL_TABLET | Freq: Every day | ORAL | Status: DC

## 2015-03-24 MED ORDER — QUETIAPINE FUMARATE 100 MG PO TABS: 100.0000 mg | ORAL_TABLET | Freq: Every day | ORAL | Status: DC

## 2015-03-24 NOTE — [facility] Counselor (Signed)
[place] [facility] [place]. When called, was directed to speak with director.  Was given direct phone to Boston ScientificDirector Courtney Stokes [phone]. Left message, to call back to OBS unit. Roe Koffman K. Sherlon HandingHarris, LCAS-A, LPC-A, [facility]  Counselor 03/24/2015 9:28 AM

## 2015-03-24 NOTE — Progress Notes (Signed)
Discharge note: Clinical research associatewriter reviewed with pt sample meds and prescriptions. Pt verbalized understanding of med regimen. Pt then given sample meds and prescriptions. AVS reviewed with pt by Amada JupiterShean, Counselor. Pt received belongings from locker. Pt safely discharged to the lobby and picked up by a friend.

## 2015-03-24 NOTE — Progress Notes (Signed)
Met patient sleeping during the shift change. Continues to sleep at this time. Staff woke patient up for bedtime meds. Went back to bed. Patient responding "no" to every question  when this writer tried to engage patient in a conversation. Unwilling to cooperate.  Staff offered support as needed. Encouraged patient to verbalize needs and concerns to staff. Safety maintained. Will continue to monitor and observe patient for safety and stability.

## 2015-03-24 NOTE — Discharge Summary (Signed)
[facility] Discharge Summary Note  Patient:  Franklin [name] is an 45 y.o., male MRN:  [id number] DOB:  [birth year] Patient phone:  [phone] (home)  Patient address:   [address] [address],  Total Time spent with patient: Greater than 30 minutes  Date of Admission:  03/22/2015 Date of Discharge: 03/24/2015  Reason for Admission: Depressive symptoms, relapse on cocaine  Principal Problem: Major depressive disorder, recurrent, severe without psychotic features [facility]) Discharge Diagnoses: Patient Active Problem List   Diagnosis Date Noted  . Generalized anxiety disorder [F41.1]   . Substance abuse [F19.10] 03/22/2015  . Tobacco use disorder [F17.200] 10/06/2014  . Major depressive disorder, recurrent episode, severe, with psychotic behavior ([place]) [F33.3] 10/05/2014  . Cocaine use disorder, severe, dependence ([place]) [F14.20] 10/02/2014  . Major depressive disorder, recurrent, severe without psychotic features ([place]) [F33.2]   . Alcohol use disorder, severe, dependence ([place]) [F10.20] 06/18/2014  . Alcohol dependence with alcohol-induced disorder ([place]) [F10.29] 05/22/2013  . Substance induced mood disorder ([place]) [F19.94] 03/14/2013  . Polysubstance abuse [F19.10] 04/08/2012    Musculoskeletal: Strength & Muscle Tone: within normal limits Gait & Station: normal Patient leans: N/A  Physical Exam  Nursing note and vitals reviewed. Constitutional: He is oriented to person, place, and time.  Neck: Normal range of motion.  Respiratory: Effort normal.  Musculoskeletal: Normal range of motion.  Neurological: He is oriented to person, place, and time.    Review of Systems  Psychiatric/Behavioral: Negative for suicidal ideas and hallucinations. Depression: Stable. Nervous/anxious: Stable. Insomnia: Stable.   All other systems reviewed and are negative.   Blood pressure 122/74, pulse 62, temperature 98.2 F (36.8 C), temperature source Oral, resp. rate 18, height 5'  10" (1.778 m), weight 79.379 kg (175 lb), SpO2 95 %.Body mass index is 25.11 kg/(m^2).  Have you used any form of tobacco in the last 30 days? (Cigarettes, Smokeless Tobacco, Cigars, and/or Pipes): Yes  Has this patient used any form of tobacco in the last 30 days? (Cigarettes, Smokeless Tobacco, Cigars, and/or Pipes) Yes, A prescription for an FDA-approved tobacco cessation medication was offered at discharge and the patient refused  Past Medical History:  Past Medical History  Diagnosis Date  . Depression   . Anxiety   . Bipolar 1 disorder ([place])   . No pertinent past medical history   . Schizophrenia [facility])     Past Surgical History  Procedure Laterality Date  . No past surgeries     Family History:  Family History  Problem Relation Age of Onset  . Hypertension Other   . Diabetes Other    Social History:  History  Alcohol Use  . 7.2 oz/week  . 12 Cans of beer per week    Comment: He drinks one 12 pack of beer per day. There is a long history of alcohol use.     History  Drug Use  . 2.00 per week  . Special: "Crack" cocaine, Cocaine    Comment: He smokes cocaine twice a week    Social History   Social History  . Marital Status: Single    Spouse Name: N/A  . Number of Children: N/A  . Years of Education: N/A   Social History Main Topics  . Smoking status: Current Every Day Smoker -- 0.50 packs/day    Types: Cigarettes  . Smokeless tobacco: Never Used  . Alcohol Use: 7.2 oz/week    12 Cans of beer per week     Comment: He drinks one  12 pack of beer per day. There is a long history of alcohol use.  . Drug Use: 2.00 per week    Special: "Crack" cocaine, Cocaine     Comment: He smokes cocaine twice a week  . Sexual Activity: Yes   Other Topics Concern  . None   Social History Narrative   The patient was born and raised in with that by both his biological parents. His father's past way but his mother still living. He denies any history of any physical or sexual  abuse. He says he completed 2 years of college at [employer]. 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 He has never been married but has a 74 year old son who lives with his mother. He says he does get to see his son and has a relationship with him. He is not currently dating or in a relationship.      The patient does have a history of a DUI and had a court date last Friday for a DUI. He denies any other pending charges.               Risk to Self: Is patient at risk for suicide?: Yes Risk to Others:   Prior Inpatient Therapy:   Prior Outpatient Therapy:    Level of Care:  OP  Hospital Course:    Franklin [name] was admitted to [facility] after his presentation to [facility] requesting detox and complaints of worsening anxiety and depression. Patient states that he resides in the [facility] but is able to come and go. States that he has a history of depression but has always neglected to take medications; reports that he has been doing well since he has been in the [facility] but at all times has the recurring thoughts of death or feeling that things would be better if he wasn't here; "on a daily basis I have to fight to see life; at first I thought it was because I was on drugs and stuff but now I have a place to stay, car, and job; but I'm still having the same feelings." Patient also reports that he wants to start medication now for his depression/anxiety. At this time patient denies suicidal/homicidal ideation, psychosis, and paranoia.Reported that he will be going back to the [facility].   Improvement was monitored by observation and Franklin [name] daily report of symptom reduction.  Emotional and mental status was monitored by daily self-inventory reports completed by Franklin [name] and clinical staff.         Franklin [name] was evaluated by the treatment team for stability and plans for  continued recovery upon discharge.  Franklin [name] motivation was an integral factor for scheduling further treatment.  Employment, transportation, bed availability, health status, family support, and any pending legal issues were also considered during his hospital stay. Franklin [name] will follow up with the services as listed below under Follow Up Information. Patient was encouraged to abstain from illicit drug use such as cocaine and to remain compliant with his psychotropic medications. His urine drug screen on admission was positive for cocaine and marijuana.  He was started on Zoloft 25 mg daily to help with his depressive symptoms along with Seroquel 100 mg hs for insomnia and Neurontin 200 mg TID for anxiety.   Upon completion of this admission the patient was both mentally and medically stable for discharge denying suicidal/homicidal ideation, auditory/visual/tactile  hallucinations, delusional thoughts and paranoia.   Patient was provided with prescriptions and samples for his psychotropic medications.   Consults:  None  Significant Diagnostic Studies:  labs: UDS, ETOH, CBC, CMET  Discharge Vitals:   Blood pressure 122/74, pulse 62, temperature 98.2 F (36.8 C), temperature source Oral, resp. rate 18, height _0  (1.778 m), weight 79.379 kg (175 lb), SpO2 95 %. Body mass index is 25.11 kg/(m^2). Lab Results:   Results for orders placed or performed during the hospital encounter of 03/22/15 (from the past 72 hour(s))  Comprehensive metabolic panel     Status: Abnormal   Collection Time: 03/22/15 10:47 AM  Result Value Ref Range   Sodium 140 135 - 145 mmol/L   Potassium 3.9 3.5 - 5.1 mmol/L   Chloride 105 101 - 111 mmol/L   CO2 24 22 - 32 mmol/L   Glucose, Bld 147 (H) 65 - 99 mg/dL   BUN 8 6 - 20 mg/dL   Creatinine, Ser 0.85 0.61 - 1.24 mg/dL   Calcium 9.1 8.9 - 10.3 mg/dL   Total Protein 6.3 (L) 6.5 - 8.1 g/dL   Albumin 3.8 3.5 - 5.0 g/dL   AST 16 15 - 41 U/L   ALT 14 (L) 17 - 63  U/L   Alkaline Phosphatase 43 38 - 126 U/L   Total Bilirubin 1.4 (H) 0.3 - 1.2 mg/dL   GFR calc non Af Amer >60 >60 mL/min   GFR calc Af Amer >60 >60 mL/min    Comment: (NOTE) The eGFR has been calculated using the CKD EPI equation. This calculation has not been validated in all clinical situations. eGFR's persistently <60 mL/min signify possible Chronic Kidney Disease.    Anion gap 11 5 - 15  Ethanol     Status: None   Collection Time: 03/22/15 10:47 AM  Result Value Ref Range   Alcohol, Ethyl (B) <5 <5 mg/dL    Comment:        LOWEST DETECTABLE LIMIT FOR SERUM ALCOHOL IS 5 mg/dL FOR MEDICAL PURPOSES ONLY   CBC with Diff     Status: None   Collection Time: 03/22/15 10:47 AM  Result Value Ref Range   WBC 7.5 4.0 - 10.5 K/uL   RBC 5.16 4.22 - 5.81 MIL/uL   Hemoglobin 15.3 13.0 - 17.0 g/dL   HCT 43.3 39.0 - 52.0 %   MCV 83.9 78.0 - 100.0 fL   MCH 29.7 26.0 - 34.0 pg   MCHC 35.3 30.0 - 36.0 g/dL   RDW 13.7 11.5 - 15.5 %   Platelets 251 150 - 400 K/uL   Neutrophils Relative % 68 %   Neutro Abs 5.2 1.7 - 7.7 K/uL   Lymphocytes Relative 20 %   Lymphs Abs 1.5 0.7 - 4.0 K/uL   Monocytes Relative 8 %   Monocytes Absolute 0.6 0.1 - 1.0 K/uL   Eosinophils Relative 3 %   Eosinophils Absolute 0.2 0.0 - 0.7 K/uL   Basophils Relative 1 %   Basophils Absolute 0.1 0.0 - 0.1 K/uL  Urine rapid drug screen (hosp performed)not at [facility]     Status: Abnormal   Collection Time: 03/22/15 11:00 AM  Result Value Ref Range   Opiates NONE DETECTED NONE DETECTED   Cocaine POSITIVE (A) NONE DETECTED   Benzodiazepines NONE DETECTED NONE DETECTED   Amphetamines NONE DETECTED NONE DETECTED   Tetrahydrocannabinol POSITIVE (A) NONE DETECTED   Barbiturates NONE DETECTED NONE DETECTED    Comment:  DRUG SCREEN FOR MEDICAL PURPOSES ONLY.  IF CONFIRMATION IS NEEDED FOR ANY PURPOSE, NOTIFY LAB WITHIN 5 DAYS.        LOWEST DETECTABLE LIMITS FOR URINE DRUG SCREEN Drug Class       Cutoff  (ng/mL) Amphetamine      1000 Barbiturate      200 Benzodiazepine   845 Tricyclics       364 Opiates          300 Cocaine          300 THC              50     Physical Findings: AIMS: Facial and Oral Movements Muscles of Facial Expression: None, normal Lips and Perioral Area: None, normal Jaw: None, normal Tongue: None, normal,Extremity Movements Upper (arms, wrists, hands, fingers): None, normal Lower (legs, knees, ankles, toes): None, normal, Trunk Movements Neck, shoulders, hips: None, normal, Overall Severity Severity of abnormal movements (highest score from questions above): None, normal Incapacitation due to abnormal movements: None, normal Patient's awareness of abnormal movements (rate only patient's report): No Awareness, Dental Status Current problems with teeth and/or dentures?: No Does patient usually wear dentures?: No  CIWA:  CIWA-Ar Total: 5 COWS:      See Psychiatric Specialty Exam and Suicide Risk Assessment completed by Attending Physician prior to discharge.  Discharge destination:  Home  Is patient on multiple antipsychotic therapies at discharge:  No   Has Patient had three or more failed trials of antipsychotic monotherapy by history:  No    Recommended Plan for Multiple Antipsychotic Therapies: NA     Medication List    STOP taking these medications        buPROPion 150 MG 24 hr tablet  Commonly known as:  WELLBUTRIN XL      TAKE these medications      Indication   acetaminophen 325 MG tablet  Commonly known as:  TYLENOL  Take 650 mg by mouth every 6 (six) hours as needed for mild pain.      gabapentin 100 MG capsule  Commonly known as:  NEURONTIN  Take 2 capsules (200 mg total) by mouth 3 (three) times daily.   Indication:  Neuropathic Pain, neuropathic pain     hydrOXYzine 25 MG tablet  Commonly known as:  ATARAX/VISTARIL  Take 1 tablet (25 mg total) by mouth 3 (three) times daily as needed for anxiety.   Indication:  Anxiety  Neurosis     multivitamin with minerals Tabs tablet  Take 1 tablet by mouth daily.   Indication:  Nutritional Support, Vitamin Supplementation     pantoprazole 40 MG tablet  Commonly known as:  PROTONIX  Take 1 tablet (40 mg total) by mouth daily before breakfast.   Indication:  Gastroesophageal Reflux Disease     QUEtiapine 100 MG tablet  Commonly known as:  SEROQUEL  Take 1 tablet (100 mg total) by mouth at bedtime.   Indication:  Insomnia     sertraline 25 MG tablet  Commonly known as:  ZOLOFT  Take 1 tablet (25 mg total) by mouth daily.   Indication:  Major Depressive Disorder, Panic Disorder, anxiety       Follow-up Information    Follow up with [facility]. Go today.   Why:  continuation of care/ medication management   Contact information:   [address] [place], [place] [address] [phone] Loreli Dollar Counselor [phone]       Follow-up recommendations:  Activity:  As tolerated Diet:  As tolerated  Comments:   Patient has been instructed to take medications as prescribed; and report adverse effects to outpatient provider.  Follow up with primary doctor for any medical issues and If symptoms recur report to nearest emergency or crisis hot line.    Total Discharge Time: Greater than 30 minutes  Signed: Elmarie Shiley, NP-C 03/24/2015, 3:33 PM

## 2015-03-24 NOTE — Progress Notes (Signed)
D: Pt presents with flat affect and depressed mood. Pt reported decreased depression today. Pt denies suicidal/homicidal thoughts this morning. Pt rated anxiety 6/10. No contributing stressors verbalized by pt. Writer offered pt prn Vistaril at that time. Med administered to pt. Pt c/o nasal congestion and requested cortisone. Writer suggested obtaining an order for an allergy pill. Pt agreed to suggestion. Pt verbalized that he plans to discharge today back to the [facility] for tx. A: Orders reviewed by Clinical research associatewriter. Medications administered as ordered per MD. Verbal support provided.  R: Pt verbalized readiness for discharge today. Pt denied any questions pertaining to discharge.

## 2015-10-15 ENCOUNTER — Encounter (HOSPITAL_COMMUNITY): Payer: Self-pay

## 2015-10-15 ENCOUNTER — Emergency Department (HOSPITAL_COMMUNITY)
Admission: EM | Admit: 2015-10-15 | Discharge: 2015-10-15 | Disposition: A | Discharge status: Home / Self Care | Payer: Self-pay | Attending: Dermatology | Admitting: Dermatology

## 2015-10-15 DIAGNOSIS — F329 Major depressive disorder, single episode, unspecified: Secondary | ICD-10-CM | POA: Insufficient documentation

## 2015-10-15 DIAGNOSIS — F1490 Cocaine use, unspecified, uncomplicated: Secondary | ICD-10-CM | POA: Insufficient documentation

## 2015-10-15 DIAGNOSIS — Z5321 Procedure and treatment not carried out due to patient leaving prior to being seen by health care provider: Secondary | ICD-10-CM | POA: Insufficient documentation

## 2015-10-15 DIAGNOSIS — Z79899 Other long term (current) drug therapy: Secondary | ICD-10-CM | POA: Insufficient documentation

## 2015-10-15 NOTE — ED Triage Notes (Signed)
Pt states that he is suffering from depression. About a month ago he lost his job. Has a hx of bipolar depression, anxiety, and schizophrenia. Pt is requesting to go to behavioral health. Pt denies wanting to harm or kill himself. States that he is trying to avoid all that. A&Ox4. Ambulatory.

## 2015-10-15 NOTE — ED Notes (Signed)
Urine sample in triage 

## 2015-10-15 NOTE — ED Notes (Signed)
Pt left without signing out 

## 2015-10-15 NOTE — ED Notes (Signed)
Bed: WTR8 Expected date:  Expected time:  Means of arrival:  Comments: 

## 2015-10-15 NOTE — ED Notes (Signed)
Pt LWBS after triage.

## 2015-10-15 NOTE — ED Notes (Signed)
Pt states that he will go to [facility] tomorrow Pt does not want to be seen. Pt still denies any thoughts of harming himself.

## 2015-11-23 ENCOUNTER — Encounter (HOSPITAL_COMMUNITY): Payer: Self-pay | Admitting: Emergency Medicine

## 2015-11-23 ENCOUNTER — Emergency Department (HOSPITAL_COMMUNITY)
Admission: EM | Admit: 2015-11-23 | Discharge: 2015-11-23 | Disposition: A | Discharge status: Home / Self Care | Payer: Self-pay | Attending: Emergency Medicine | Admitting: Emergency Medicine

## 2015-11-23 DIAGNOSIS — F1010 Alcohol abuse, uncomplicated: Secondary | ICD-10-CM | POA: Insufficient documentation

## 2015-11-23 DIAGNOSIS — F1910 Other psychoactive substance abuse, uncomplicated: Secondary | ICD-10-CM

## 2015-11-23 DIAGNOSIS — Z79899 Other long term (current) drug therapy: Secondary | ICD-10-CM | POA: Insufficient documentation

## 2015-11-23 DIAGNOSIS — F32A Depression, unspecified: Secondary | ICD-10-CM

## 2015-11-23 DIAGNOSIS — F329 Major depressive disorder, single episode, unspecified: Secondary | ICD-10-CM | POA: Insufficient documentation

## 2015-11-23 DIAGNOSIS — F17210 Nicotine dependence, cigarettes, uncomplicated: Secondary | ICD-10-CM | POA: Insufficient documentation

## 2015-11-23 LAB — CBC WITH DIFFERENTIAL/PLATELET
Basophils Absolute: 0 10*3/uL (ref 0.0–0.1)
Basophils Relative: 1 %
Eosinophils Absolute: 0.3 10*3/uL (ref 0.0–0.7)
Eosinophils Relative: 3 %
HCT: 42.4 % (ref 39.0–52.0)
Hemoglobin: 14.7 g/dL (ref 13.0–17.0)
Lymphocytes Relative: 28 %
Lymphs Abs: 2.4 10*3/uL (ref 0.7–4.0)
MCH: 29.9 pg (ref 26.0–34.0)
MCHC: 34.7 g/dL (ref 30.0–36.0)
MCV: 86.2 fL (ref 78.0–100.0)
Monocytes Absolute: 0.5 10*3/uL (ref 0.1–1.0)
Monocytes Relative: 6 %
Neutro Abs: 5.4 10*3/uL (ref 1.7–7.7)
Neutrophils Relative %: 62 %
Platelets: 321 10*3/uL (ref 150–400)
RBC: 4.92 MIL/uL (ref 4.22–5.81)
RDW: 14.3 % (ref 11.5–15.5)
WBC: 8.7 10*3/uL (ref 4.0–10.5)

## 2015-11-23 NOTE — ED Triage Notes (Signed)
Pt reports feeling depressed and wants help with alcohol abuse. Pt states that he wants to be placed on a list for treatment. Currently denies any SI/HI. Pt reports that he drinks a 6pack a day of beer.

## 2015-11-23 NOTE — ED Notes (Signed)
PA at bedside.

## 2015-11-23 NOTE — Discharge Instructions (Signed)
[facility]

## 2015-11-23 NOTE — ED Provider Notes (Signed)
[facility]-EMERGENCY DEPT Provider Note   CSN: [id number] Arrival date & time: 11/23/15  2302     History   Chief Complaint Chief Complaint  Patient presents with  . Alcohol Problem  . Depression    HPI Franklin [name] is a 45 y.o. male.  Patient presents to the ED with a chief complaint of alcohol abuse and depression.  He states that he would like to get his screening labs and exam done so that [facility].  He states that he drinks a 6 pack per day.  He reports last Etoh today and also reports using crack.  He denies any chest pain today, but has had some in the past after using crack.  He denies any fever, chills, cough, n/v/d.  He denies any SI or HI.  There are no other associate symptoms.   The history is provided by the patient. No language interpreter was used.    Past Medical History:  Diagnosis Date  . Anxiety   . Bipolar 1 disorder ([facility])   . Depression   . No pertinent past medical history   . Schizophrenia [facility](HCC)     Patient Active Problem List   Diagnosis Date Noted  . Generalized anxiety disorder   . Substance abuse 03/22/2015  . Tobacco use disorder 10/06/2014  . Major depressive disorder, recurrent episode, severe, with psychotic behavior ([facility]) 10/05/2014  . Cocaine use disorder, severe, dependence ([facility]) 10/02/2014  . Major depressive disorder, recurrent, severe without psychotic features ([facility])   . Alcohol use disorder, severe, dependence ([facility]) 06/18/2014  . Alcohol dependence with alcohol-induced disorder ([facility]) 05/22/2013  . Substance induced mood disorder ([facility]) 03/14/2013  . Polysubstance abuse 04/08/2012    Past Surgical History:  Procedure Laterality Date  . NO PAST SURGERIES         Home Medications    Prior to Admission medications   Medication Sig Start Date End Date Taking? Authorizing Provider  acetaminophen (TYLENOL) 325 MG tablet Take 650 mg by mouth every 6 (six) hours as needed for mild pain.    Historical  Provider, MD  gabapentin (NEURONTIN) 100 MG capsule Take 2 capsules (200 mg total) by mouth 3 (three) times daily. 03/24/15   Thermon LeylandLaura A Davis, NP  hydrOXYzine (ATARAX/VISTARIL) 25 MG tablet Take 1 tablet (25 mg total) by mouth 3 (three) times daily as needed for anxiety. 10/07/14   Shari ProwsJolanta B Pucilowska, MD  Multiple Vitamin (MULTIVITAMIN WITH MINERALS) TABS tablet Take 1 tablet by mouth daily. 03/24/15   Thermon LeylandLaura A Davis, NP  pantoprazole (PROTONIX) 40 MG tablet Take 1 tablet (40 mg total) by mouth daily before breakfast. 10/07/14   Jolanta B Pucilowska, MD  QUEtiapine (SEROQUEL) 100 MG tablet Take 1 tablet (100 mg total) by mouth at bedtime. 03/24/15   Thermon LeylandLaura A Davis, NP  sertraline (ZOLOFT) 25 MG tablet Take 1 tablet (25 mg total) by mouth daily. 03/24/15   Thermon LeylandLaura A Davis, NP    Family History Family History  Problem Relation Age of Onset  . Hypertension Other   . Diabetes Other     Social History Social History  Substance Use Topics  . Smoking status: Current Every Day Smoker    Packs/day: 0.50    Types: Cigarettes  . Smokeless tobacco: Never Used  . Alcohol use 7.2 oz/week    12 Cans of beer per week     Comment: He drinks one 12 pack of beer per day. There is a long history  of alcohol use.     Allergies   Patient has no known allergies.   Review of Systems Review of Systems  All other systems reviewed and are negative.    Physical Exam Updated Vital Signs BP 135/96 (BP Location: Left Arm)   Pulse 75   Temp 97.5 F (36.4 C) (Oral)   Resp 16   Ht 5\' 6"  (1.676 m)   Wt 72.6 kg   SpO2 100%   BMI 25.82 kg/m   Physical Exam  Constitutional: He is oriented to person, place, and time. He appears well-developed and well-nourished.  HENT:  Head: Normocephalic and atraumatic.  Eyes: Conjunctivae and EOM are normal. Pupils are equal, round, and reactive to light. Right eye exhibits no discharge. Left eye exhibits no discharge. No scleral icterus.  Neck: Normal range of motion.  Neck supple. No JVD present.  Cardiovascular: Normal rate, regular rhythm and normal heart sounds.  Exam reveals no gallop and no friction rub.   No murmur heard. Pulmonary/Chest: Effort normal and breath sounds normal. No respiratory distress. He has no wheezes. He has no rales. He exhibits no tenderness.  Abdominal: Soft. He exhibits no distension and no mass. There is no tenderness. There is no rebound and no guarding.  Musculoskeletal: Normal range of motion. He exhibits no edema or tenderness.  Neurological: He is alert and oriented to person, place, and time.  Skin: Skin is warm and dry.  Psychiatric: He has a normal mood and affect. His behavior is normal. Judgment and thought content normal.  Nursing note and vitals reviewed.    ED Treatments / Results  Labs (all labs ordered are listed, but only abnormal results are displayed) Labs Reviewed  COMPREHENSIVE METABOLIC PANEL  ETHANOL  CBC WITH DIFFERENTIAL/PLATELET  RAPID URINE DRUG SCREEN, HOSP PERFORMED    EKG  EKG Interpretation None       Radiology No results found.  Procedures Procedures (including critical care time)  Medications Ordered in ED Medications - No data to display   Initial Impression / Assessment and Plan / ED Course  I have reviewed the triage vital signs and the nursing notes.  Pertinent labs & imaging results that were available during my care of the patient were reviewed by me and considered in my medical decision making (see chart for details).  Clinical Course     Patient here for screening exam so [facility].  Medically clear pending normal labs.  11:54 PM Patient requests to be discharged at this time.  He states he will seek follow-up in the morning.  Final Clinical Impressions(s) / ED Diagnoses   Final diagnoses:  Depression, unspecified depression type  Substance abuse    New Prescriptions New Prescriptions   No medications on file     Roxy HorsemanRobert  Najib Colmenares, PA-C 11/23/15 2354    Shaune Pollackameron Isaacs, MD 11/25/15 [phone]

## 2015-11-24 LAB — COMPREHENSIVE METABOLIC PANEL
ALT: 17 U/L (ref 17–63)
AST: 21 U/L (ref 15–41)
Albumin: 4.4 g/dL (ref 3.5–5.0)
Alkaline Phosphatase: 53 U/L (ref 38–126)
Anion gap: 8 (ref 5–15)
BUN: 7 mg/dL (ref 6–20)
CO2: 28 mmol/L (ref 22–32)
Calcium: 9.6 mg/dL (ref 8.9–10.3)
Chloride: 101 mmol/L (ref 101–111)
Creatinine, Ser: 0.83 mg/dL (ref 0.61–1.24)
GFR calc Af Amer: >60 mL/min (ref >60)
GFR calc non Af Amer: >60 mL/min (ref >60)
Glucose, Bld: 117 mg/dL — ABNORMAL HIGH (ref 65–99)
Potassium: 3.5 mmol/L (ref 3.5–5.1)
Sodium: 137 mmol/L (ref 135–145)
Total Bilirubin: 0.6 mg/dL (ref 0.3–1.2)
Total Protein: 7.8 g/dL (ref 6.5–8.1)

## 2015-11-24 LAB — RAPID URINE DRUG SCREEN, HOSP PERFORMED
Amphetamines: NOT DETECTED
Barbiturates: NOT DETECTED
Benzodiazepines: NOT DETECTED
Cocaine: POSITIVE — AB
Opiates: NOT DETECTED
Tetrahydrocannabinol: NOT DETECTED

## 2015-11-24 LAB — ETHANOL: Alcohol, Ethyl (B): 54 mg/dL — ABNORMAL HIGH (ref <5)

## 2016-02-02 ENCOUNTER — Encounter (HOSPITAL_COMMUNITY): Payer: Self-pay | Admitting: Emergency Medicine

## 2016-02-02 ENCOUNTER — Emergency Department (HOSPITAL_COMMUNITY)
Admission: EM | Admit: 2016-02-02 | Discharge: 2016-02-02 | Disposition: A | Discharge status: Other Acute Inpatient Hospital | Payer: Self-pay | Attending: Emergency Medicine | Admitting: Emergency Medicine

## 2016-02-02 ENCOUNTER — Observation Stay (HOSPITAL_COMMUNITY)
Admission: AD | Admit: 2016-02-02 | Discharge: 2016-02-04 | Payer: Self-pay | Source: Intra-hospital | Attending: Psychiatry | Admitting: Psychiatry

## 2016-02-02 DIAGNOSIS — Z8249 Family history of ischemic heart disease and other diseases of the circulatory system: Secondary | ICD-10-CM | POA: Insufficient documentation

## 2016-02-02 DIAGNOSIS — Z833 Family history of diabetes mellitus: Secondary | ICD-10-CM | POA: Insufficient documentation

## 2016-02-02 DIAGNOSIS — F332 Major depressive disorder, recurrent severe without psychotic features: Secondary | ICD-10-CM | POA: Diagnosis present

## 2016-02-02 DIAGNOSIS — F1910 Other psychoactive substance abuse, uncomplicated: Secondary | ICD-10-CM | POA: Insufficient documentation

## 2016-02-02 DIAGNOSIS — F411 Generalized anxiety disorder: Secondary | ICD-10-CM | POA: Insufficient documentation

## 2016-02-02 DIAGNOSIS — F209 Schizophrenia, unspecified: Secondary | ICD-10-CM | POA: Insufficient documentation

## 2016-02-02 DIAGNOSIS — Z79899 Other long term (current) drug therapy: Secondary | ICD-10-CM | POA: Insufficient documentation

## 2016-02-02 DIAGNOSIS — F319 Bipolar disorder, unspecified: Secondary | ICD-10-CM | POA: Insufficient documentation

## 2016-02-02 DIAGNOSIS — F17210 Nicotine dependence, cigarettes, uncomplicated: Secondary | ICD-10-CM | POA: Insufficient documentation

## 2016-02-02 DIAGNOSIS — F1014 Alcohol abuse with alcohol-induced mood disorder: Principal | ICD-10-CM | POA: Diagnosis present

## 2016-02-02 DIAGNOSIS — F1994 Other psychoactive substance use, unspecified with psychoactive substance-induced mood disorder: Secondary | ICD-10-CM | POA: Diagnosis present

## 2016-02-02 DIAGNOSIS — F1420 Cocaine dependence, uncomplicated: Secondary | ICD-10-CM | POA: Insufficient documentation

## 2016-02-02 DIAGNOSIS — K219 Gastro-esophageal reflux disease without esophagitis: Secondary | ICD-10-CM | POA: Insufficient documentation

## 2016-02-02 DIAGNOSIS — F339 Major depressive disorder, recurrent, unspecified: Secondary | ICD-10-CM | POA: Insufficient documentation

## 2016-02-02 DIAGNOSIS — R45851 Suicidal ideations: Secondary | ICD-10-CM | POA: Insufficient documentation

## 2016-02-02 DIAGNOSIS — R440 Auditory hallucinations: Secondary | ICD-10-CM | POA: Insufficient documentation

## 2016-02-02 LAB — CBC
HCT: 44 % (ref 39.0–52.0)
Hemoglobin: 15.1 g/dL (ref 13.0–17.0)
MCH: 29.5 pg (ref 26.0–34.0)
MCHC: 34.3 g/dL (ref 30.0–36.0)
MCV: 86.1 fL (ref 78.0–100.0)
Platelets: 290 10*3/uL (ref 150–400)
RBC: 5.11 MIL/uL (ref 4.22–5.81)
RDW: 14.2 % (ref 11.5–15.5)
WBC: 6.7 10*3/uL (ref 4.0–10.5)

## 2016-02-02 LAB — ACETAMINOPHEN LEVEL: Acetaminophen (Tylenol), Serum: <10 ug/mL — ABNORMAL LOW (ref 10–30)

## 2016-02-02 LAB — SALICYLATE LEVEL: Salicylate Lvl: <7 mg/dL (ref 2.8–30.0)

## 2016-02-02 LAB — COMPREHENSIVE METABOLIC PANEL
ALT: 22 U/L (ref 17–63)
AST: 22 U/L (ref 15–41)
Albumin: 4.9 g/dL (ref 3.5–5.0)
Alkaline Phosphatase: 55 U/L (ref 38–126)
Anion gap: 10 (ref 5–15)
BUN: 10 mg/dL (ref 6–20)
CO2: 26 mmol/L (ref 22–32)
Calcium: 9.4 mg/dL (ref 8.9–10.3)
Chloride: 105 mmol/L (ref 101–111)
Creatinine, Ser: 1 mg/dL (ref 0.61–1.24)
GFR calc Af Amer: >60 mL/min (ref >60)
GFR calc non Af Amer: >60 mL/min (ref >60)
Glucose, Bld: 85 mg/dL (ref 65–99)
Potassium: 3.8 mmol/L (ref 3.5–5.1)
Sodium: 141 mmol/L (ref 135–145)
Total Bilirubin: 1.9 mg/dL — ABNORMAL HIGH (ref 0.3–1.2)
Total Protein: 8.1 g/dL (ref 6.5–8.1)

## 2016-02-02 LAB — ETHANOL: Alcohol, Ethyl (B): 71 mg/dL — ABNORMAL HIGH (ref <5)

## 2016-02-02 MED ORDER — LOPERAMIDE HCL 2 MG PO CAPS: 2.0000 mg | ORAL_CAPSULE | ORAL | Status: DC | PRN

## 2016-02-02 MED ORDER — METHOCARBAMOL 500 MG PO TABS: 500.0000 mg | ORAL_TABLET | Freq: Three times a day (TID) | ORAL | Status: DC | PRN

## 2016-02-02 MED ORDER — METHOCARBAMOL 500 MG PO TABS
500.0000 mg | ORAL_TABLET | Freq: Three times a day (TID) | ORAL | Status: DC | PRN
  Administered 2016-02-03: 500 mg via ORAL
  Filled 2016-02-02: qty 1

## 2016-02-02 MED ORDER — CLONIDINE HCL 0.1 MG PO TABS: 0.1000 mg | ORAL_TABLET | ORAL | Status: DC

## 2016-02-02 MED ORDER — CLONIDINE HCL 0.1 MG PO TABS
0.1000 mg | ORAL_TABLET | Freq: Four times a day (QID) | ORAL | Status: DC
  Administered 2016-02-02: 0.1 mg via ORAL
  Filled 2016-02-02: qty 1

## 2016-02-02 MED ORDER — NAPROXEN 500 MG PO TABS: 500.0000 mg | ORAL_TABLET | Freq: Two times a day (BID) | ORAL | Status: DC | PRN

## 2016-02-02 MED ORDER — DICYCLOMINE HCL 20 MG PO TABS: 20.0000 mg | ORAL_TABLET | Freq: Four times a day (QID) | ORAL | Status: DC | PRN

## 2016-02-02 MED ORDER — DICYCLOMINE HCL 20 MG PO TABS
20.0000 mg | ORAL_TABLET | Freq: Four times a day (QID) | ORAL | Status: DC | PRN
  Administered 2016-02-02: 20 mg via ORAL
  Filled 2016-02-02: qty 1

## 2016-02-02 MED ORDER — ACETAMINOPHEN 325 MG PO TABS: 650.0000 mg | ORAL_TABLET | Freq: Four times a day (QID) | ORAL | Status: DC | PRN

## 2016-02-02 MED ORDER — ONDANSETRON 4 MG PO TBDP: 4.0000 mg | ORAL_TABLET | Freq: Four times a day (QID) | ORAL | Status: DC | PRN

## 2016-02-02 MED ORDER — ALUM & MAG HYDROXIDE-SIMETH 200-200-20 MG/5ML PO SUSP: 30.0000 mL | ORAL | Status: DC | PRN

## 2016-02-02 MED ORDER — HYDROXYZINE HCL 25 MG PO TABS: 25.0000 mg | ORAL_TABLET | Freq: Four times a day (QID) | ORAL | Status: DC | PRN

## 2016-02-02 MED ORDER — CLONIDINE HCL 0.1 MG PO TABS: 0.1000 mg | ORAL_TABLET | Freq: Every day | ORAL | Status: DC

## 2016-02-02 MED ORDER — NAPROXEN 500 MG PO TABS
500.0000 mg | ORAL_TABLET | Freq: Two times a day (BID) | ORAL | Status: DC | PRN
  Administered 2016-02-02: 500 mg via ORAL
  Filled 2016-02-02: qty 1

## 2016-02-02 MED ORDER — MAGNESIUM HYDROXIDE 400 MG/5ML PO SUSP: 30.0000 mL | Freq: Every day | ORAL | Status: DC | PRN

## 2016-02-02 MED ORDER — CLONIDINE HCL 0.1 MG PO TABS
0.1000 mg | ORAL_TABLET | Freq: Four times a day (QID) | ORAL | Status: DC
  Administered 2016-02-02 – 2016-02-03 (×2): 0.1 mg via ORAL
  Filled 2016-02-02 (×3): qty 1

## 2016-02-02 MED ORDER — HYDROXYZINE HCL 25 MG PO TABS
25.0000 mg | ORAL_TABLET | Freq: Four times a day (QID) | ORAL | Status: DC | PRN
  Administered 2016-02-03 (×2): 25 mg via ORAL
  Filled 2016-02-02: qty 1
  Filled 2016-02-02: qty 10
  Filled 2016-02-02 (×2): qty 1

## 2016-02-02 MED ORDER — HYDROXYZINE HCL 25 MG PO TABS
25.0000 mg | ORAL_TABLET | Freq: Four times a day (QID) | ORAL | Status: DC | PRN
  Administered 2016-02-02: 25 mg via ORAL
  Filled 2016-02-02: qty 1

## 2016-02-02 MED ORDER — NAPROXEN 500 MG PO TABS
500.0000 mg | ORAL_TABLET | Freq: Two times a day (BID) | ORAL | Status: DC | PRN
  Administered 2016-02-03: 500 mg via ORAL
  Filled 2016-02-02: qty 1

## 2016-02-02 NOTE — ED Notes (Signed)
Pt oriented to room and unit.  Pt is pleasant and cooperative.  He contracts for safety.  Denies H/I and AVH.  15 minute checks and video monitoring in place.

## 2016-02-02 NOTE — BH Assessment (Signed)
[facility] Assessment Progress Note   Case was staffed with Lord DNP who recommended patient be re-evaluated in the a.m.    

## 2016-02-02 NOTE — BH Assessment (Signed)
Assessment Note  Franklin [name] is an 46 y.o. male that presents this date under IVC. IVC states: "Patient presents with auditory hallucinations and thoughts of suicide with a plan to hang himself." Patient reports he has currently been residing with his mother and has started to experience increased AH stating he hears voices that are telling him to "go ahead and kill yourself." Patient denies any current medication regimen although reports he has been seen at [facility] "a few months ago." Patient's last inpatient admission was in 2017 at [facility] and failed to follow up with his discharge plan to receive OP services at [facility] to assist with medication management and SA issues. Patient states he does not have transportation and went there "only once." Patient is vague in reference to history and is a poor historian. Patient presents with appropriate affect but speaks in a low voice. Patient is drowsy as this Clinical research associate attempts to ask assessment questions several times. Patient is difficult to redirect and slow to respond to this writer's questions. Patient does report ongoing SA use stating he uses Cocaine (Crack) two or three times a month (up to 1 gram or more). Patient reports last use sometime "last week." Patient is vague in reference to use/history. Patient also reports daily Opiate use (different Opiates) sating he "snorts" all he can get. Patient reports last use on 02/01/16 (unknown amount) along with 4 12 oz beers. Patient reports consuming 3 or 4 12 oz beers every day. Patient reports current withdrawals to include tremors and agitation. Patient also reports current AH stating he is hearing voices (command) that are telling him to harm himself. Patient stated he had a plan to hang himself but was vague in reference to details. Patient reports one prior attempt at self harm but cannot recall details/dates. Patient is oriented to time/place and denies any VH or HI. Patient does report ongoing depression with  symptoms to include: feelings of guilt and hopelessness. Patient will not disclose his recent stressors but states that he is "stressed over everything." Case was staffed with Shaune Pollack DNP who recommended patient be re-evaluated in the a.m.  Diagnosis: Bipolar 1 most recent episode depressed with psychotic features, severe Polysubstance abuse severe  Past Medical History:  Past Medical History:  Diagnosis Date  . Anxiety   . Bipolar 1 disorder (HCC)   . Depression   . No pertinent past medical history   . Schizophrenia [facility])     Past Surgical History:  Procedure Laterality Date  . NO PAST SURGERIES      Family History:  Family History  Problem Relation Age of Onset  . Hypertension Other   . Diabetes Other     Social History:  reports that he has been smoking Cigarettes.  He has been smoking about 0.50 packs per day. He has never used smokeless tobacco. He reports that he drinks about 7.2 oz of alcohol per week . He reports that he uses drugs, including "Crack" cocaine and Cocaine, about 2 times per week.  Additional Social History:  Alcohol / Drug Use Pain Medications: See PTA Prescriptions: See PTA Over the Counter: See PTA History of alcohol / drug use?: Yes Longest period of sobriety (when/how long): 5 months Negative Consequences of Use:  (Denies) Withdrawal Symptoms: Agitation, Tremors Substance #1 Name of Substance 1: Opiates 1 - Age of First Use: 34 1 - Amount (size/oz): Unknown amounts 1 - Frequency: Daily 1 - Duration: Last year 1 - Last Use / Amount: 02/01/16 "snorted" unknown amount  Substance #2 Name of Substance 2: ETOH 2 - Age of First Use: 18 2 - Amount (size/oz): 10 to 12 oz beers  2 - Frequency: 02/01/16 2 - Duration: Last year 2 - Last Use / Amount: yesterday 3 or 4 12 oz beers Substance #3 Name of Substance 3: THC 3 - Age of First Use: 18 3 - Amount (size/oz): Unknown amounts 3 - Frequency: daily  3 - Duration: Last year 3 - Last Use / Amount: "a  few days ago" client reports unknown amount Substance #4 Name of Substance 4:  (Crack) Cocaine 4 - Age of First Use: Unknown "A long time ago" per patient 4 - Amount (size/oz): 1 to 2 grams 4 - Frequency: 3 to 4 times a week 4 - Duration: Last "few" years per patient 4 - Last Use / Amount: 02/01/16 1 gram  CIWA: CIWA-Ar BP: 132/98 Pulse Rate: 78 Nausea and Vomiting: no nausea and no vomiting Tactile Disturbances: none Tremor: no tremor Auditory Disturbances: not present Paroxysmal Sweats: no sweat visible Visual Disturbances: not present Anxiety: no anxiety, at ease Headache, Fullness in Head: none present Agitation: normal activity Orientation and Clouding of Sensorium: oriented and can do serial additions CIWA-Ar Total: 0 COWS: Clinical Opiate Withdrawal Scale (COWS) Resting Pulse Rate: Pulse Rate 80 or below Sweating: No report of chills or flushing Restlessness: Able to sit still Pupil Size: Pupils pinned or normal size for room light Bone or Joint Aches: Not present Runny Nose or Tearing: Not present GI Upset: No GI symptoms Tremor: No tremor Yawning: No yawning Anxiety or Irritability: None Gooseflesh Skin: Skin is smooth COWS Total Score: 0  Allergies: No Known Allergies  Home Medications:  (Not in a hospital admission)  OB/GYN Status:  No LMP for male patient.  General Assessment Data Location of Assessment: [facility] TTS Assessment: In system Is this a Tele or Face-to-Face Assessment?: Face-to-Face Is this an Initial Assessment or a Re-assessment for this encounter?: Initial Assessment Marital status: Single Maiden name: na Is patient pregnant?: No Pregnancy Status: No Living Arrangements: Parent Can pt return to current living arrangement?: Yes Admission Status: Involuntary Is patient capable of signing voluntary admission?: Yes Referral Source: Self/Family/Friend Insurance type: Self pay  Medical Screening Exam [facility] Walk-in ONLY) Medical Exam  completed: Yes  Crisis Care Plan Living Arrangements: Parent Legal Guardian:  (na) Name of Psychiatrist: None Name of Therapist: None  Education Status Is patient currently in school?: No Current Grade: na Highest grade of school patient has completed:  (2 years college) Name of school: na Contact person: na  Risk to self with the past 6 months Suicidal Ideation: Yes-Currently Present Has patient been a risk to self within the past 6 months prior to admission? : No Suicidal Intent: Yes-Currently Present Has patient had any suicidal intent within the past 6 months prior to admission? : No Is patient at risk for suicide?: Yes Suicidal Plan?: Yes-Currently Present Has patient had any suicidal plan within the past 6 months prior to admission? : No Specify Current Suicidal Plan: hanging Access to Means: No What has been your use of drugs/alcohol within the last 12 months?: Current use Previous Attempts/Gestures: Yes How many times?: 1 Other Self Harm Risks: na Triggers for Past Attempts: Unknown Intentional Self Injurious Behavior: None Family Suicide History: No Recent stressful life event(s): Other (Comment) (family issues and lack of employment) Persecutory voices/beliefs?: No Depression: Yes Depression Symptoms: Guilt, Feeling worthless/self pity Substance abuse history and/or treatment for substance abuse?: Yes  Suicide prevention information given to non-admitted patients: Not applicable  Risk to Others within the past 6 months Homicidal Ideation: No Does patient have any lifetime risk of violence toward others beyond the six months prior to admission? : No Thoughts of Harm to Others: No Current Homicidal Intent: No Current Homicidal Plan: No Access to Homicidal Means: No Identified Victim: na History of harm to others?: No Assessment of Violence: None Noted Violent Behavior Description: na Does patient have access to weapons?: No Criminal Charges Pending?: No Does  patient have a court date: No Is patient on probation?: No  Psychosis Hallucinations: Auditory Delusions: None noted  Mental Status Report Appearance/Hygiene: In scrubs Eye Contact: Fair Motor Activity: Unremarkable Speech: Soft Level of Consciousness: Drowsy Mood: Depressed Affect: Appropriate to circumstance Anxiety Level: Minimal Thought Processes: Coherent, Relevant Judgement: Unimpaired Orientation: Person, Place, Time Obsessive Compulsive Thoughts/Behaviors: None  Cognitive Functioning Concentration: Decreased Memory: Recent Intact IQ: Average Insight: Fair Impulse Control: Poor Appetite: Fair Weight Loss: 0 Weight Gain: 0 Sleep: No Change Total Hours of Sleep: 6 Vegetative Symptoms: None  ADLScreening [facility] Assessment Services) Patient's cognitive ability adequate to safely complete daily activities?: Yes Patient able to express need for assistance with ADLs?: Yes Independently performs ADLs?: Yes (appropriate for developmental age)  Prior Inpatient Therapy Prior Inpatient Therapy: Yes Prior Therapy Dates: 2017 Prior Therapy Facilty/Provider(s): [facility] Reason for Treatment: AH, SI, SA issues  Prior Outpatient Therapy Prior Outpatient Therapy: Yes Prior Therapy Dates: 2016 Prior Therapy Facilty/Provider(s): [facility] Reason for Treatment: MH issues, med mang Does patient have an ACCT team?: No Does patient have Intensive In-House Services?  : No Does patient have [facility] services? : Yes Does patient have P4CC services?: No  ADL Screening (condition at time of admission) Patient's cognitive ability adequate to safely complete daily activities?: Yes Is the patient deaf or have difficulty hearing?: No Does the patient have difficulty seeing, even when wearing glasses/contacts?: No Does the patient have difficulty concentrating, remembering, or making decisions?: No Patient able to express need for assistance with ADLs?: Yes Does the patient have difficulty  dressing or bathing?: No Independently performs ADLs?: Yes (appropriate for developmental age) Does the patient have difficulty walking or climbing stairs?: No Weakness of Legs: None Weakness of Arms/Hands: None  Home Assistive Devices/Equipment Home Assistive Devices/Equipment: None  Therapy Consults (therapy consults require a physician order) PT Evaluation Needed: No OT Evalulation Needed: No SLP Evaluation Needed: No Abuse/Neglect Assessment (Assessment to be complete while patient is alone) Physical Abuse: Denies Verbal Abuse: Denies Sexual Abuse: Denies Exploitation of patient/patient's resources: Denies Self-Neglect: Denies Values / Beliefs Cultural Requests During Hospitalization: None Spiritual Requests During Hospitalization: None Consults Spiritual Care Consult Needed: No Social Work Consult Needed: No Merchant navy officer (For Healthcare) Does Patient Have a Medical Advance Directive?: No Would patient like information on creating a medical advance directive?: No - Patient declined    Additional Information 1:1 In Past 12 Months?: No CIRT Risk: No Elopement Risk: No Does patient have medical clearance?: Yes     Disposition: Case was staffed with Shaune Pollack DNP who recommended patient be re-evaluated in the a.m.  Disposition Initial Assessment Completed for this Encounter: Yes Disposition of Patient: Other dispositions Other disposition(s): Other (Comment) (pt will be re-evaluated in the a.m.)  On Site Evaluation by:   Reviewed with Physician:    Alfredia Ferguson 02/02/2016 12:15 PM

## 2016-02-02 NOTE — ED Notes (Signed)
Pt discharged ambulatory with [place] driver.  All belongings were sent with pt. 

## 2016-02-02 NOTE — ED Provider Notes (Signed)
Emergency Department Provider Note   I have reviewed the triage vital signs and the nursing notes.   HISTORY  Chief Complaint Suicidal (IVC) and Hallucinations (Auditory)   HPI Franklin [name] is a 46 y.o. male with PMH of bipolar disorder, depression, and polysubstance abuse presents to the emergency department for evaluation of increased auditory hallucinations and suicidal thinking over the past week. Patient states that he has been off of his psychiatry medications for the past year because they were difficult to get. In the past he has gone to [facility] for care. He notes associated alcohol, oxycodone, cocaine abuse with increased use over the last week. He reports hearing voices that are degrading to him and telling him to kill himself. He has a history of attempted hanging in the past she's been thinking more and more about doing this recently. He denies any homicidal ideation. Denies any visual hallucination. He has no medical complaints at this time   Past Medical History:  Diagnosis Date  . Anxiety   . Bipolar 1 disorder (HCC)   . Depression   . No pertinent past medical history   . Schizophrenia [facility])     Patient Active Problem List   Diagnosis Date Noted  . Alcohol abuse with alcohol-induced mood disorder (HCC) 02/02/2016  . Generalized anxiety disorder   . Substance abuse 03/22/2015  . Tobacco use disorder 10/06/2014  . Major depressive disorder, recurrent episode, severe, with psychotic behavior (HCC) 10/05/2014  . Cocaine use disorder, severe, dependence (HCC) 10/02/2014  . Major depressive disorder, recurrent, severe without psychotic features (HCC)   . Alcohol use disorder, severe, dependence (HCC) 06/18/2014  . Substance induced mood disorder (HCC) 03/14/2013  . Polysubstance abuse 04/08/2012    Past Surgical History:  Procedure Laterality Date  . NO PAST SURGERIES        Allergies Patient has no known allergies.  Family History  Problem Relation  Age of Onset  . Hypertension Other   . Diabetes Other     Social History Social History  Substance Use Topics  . Smoking status: Current Every Day Smoker    Packs/day: 0.50    Types: Cigarettes  . Smokeless tobacco: Never Used  . Alcohol use 7.2 oz/week    12 Cans of beer per week     Comment: He drinks one 12 pack of beer per day. There is a Kamillah Didonato history of alcohol use.    Review of Systems  Constitutional: No fever/chills Eyes: No visual changes. ENT: No sore throat. Cardiovascular: Denies chest pain. Respiratory: Denies shortness of breath. Gastrointestinal: No abdominal pain.  No nausea, no vomiting.  No diarrhea.  No constipation. Genitourinary: Negative for dysuria. Musculoskeletal: Negative for back pain. Skin: Negative for rash. Neurological: Negative for headaches, focal weakness or numbness. Psychiatric:Increased auditory hallucinations and suicidal thinking.   10-point ROS otherwise negative.  ____________________________________________   PHYSICAL EXAM:  VITAL SIGNS: ED Triage Vitals  Enc Vitals Group     BP 02/02/16 0719 132/98     Pulse Rate 02/02/16 0719 78     Resp 02/02/16 0719 15     Temp 02/02/16 0719 98.2 F (36.8 C)     SpO2 02/02/16 0719 93 %     Weight 02/02/16 0714 160 lb (72.6 kg)     Height 02/02/16 0714 5\' 10"  (1.778 m)     Pain Score 02/02/16 0715 0   Constitutional: Alert and oriented. Well appearing and in no acute distress. Eyes: Conjunctivae are normal.  Head:  Atraumatic. Nose: No congestion/rhinnorhea. Mouth/Throat: Mucous membranes are moist.   Neck: No stridor.   Cardiovascular: Normal rate, regular rhythm. Good peripheral circulation. Grossly normal heart sounds.   Respiratory: Normal respiratory effort.  No retractions. Lungs CTAB. Gastrointestinal: Soft and nontender. No distention.  Musculoskeletal: No lower extremity tenderness nor edema. No gross deformities of extremities. Neurologic:  Normal speech and language.  No gross focal neurologic deficits are appreciated.  Skin:  Skin is warm, dry and intact. No rash noted. Psychiatric: Mood and affect are flat. Speech and behavior are normal. Not obviously responding to internal stimulus.   ____________________________________________   LABS (all labs ordered are listed, but only abnormal results are displayed)  Labs Reviewed  COMPREHENSIVE METABOLIC PANEL - Abnormal; Notable for the following:       Result Value   Total Bilirubin 1.9 (*)    All other components within normal limits  ETHANOL - Abnormal; Notable for the following:    Alcohol, Ethyl (B) 71 (*)    All other components within normal limits  ACETAMINOPHEN LEVEL - Abnormal; Notable for the following:    Acetaminophen (Tylenol), Serum <10 (*)    All other components within normal limits  SALICYLATE LEVEL  CBC  HIV ANTIBODY (ROUTINE TESTING)   ____________________________________________  RADIOLOGY  None ____________________________________________   PROCEDURES  Procedure(s) performed:   Procedures  None ____________________________________________   INITIAL IMPRESSION / ASSESSMENT AND PLAN / ED COURSE  Pertinent labs & imaging results that were available during my care of the patient were reviewed by me and considered in my medical decision making (see chart for details).  Patient resents to the emergency department for evaluation of auditory hallucinations and increased suicidal thinking. He has not been on his medication for the last year and has not [facility] as he is done in the past. No medical complaints at this time. He does endorse polysubstance abuse including alcohol, cocaine, oxycodone. No documented history of complicated alcohol withdrawal. Plan to obtain baseline labs. Patient does seem to meet inpatient criteria at this time. Plan to place IVC although the patient is cooperative currently. I explained this to him and he is in agreement.   08:30  AM Completed IVC paperwork and filed. Placed order for HIV test as patient thinks that he may have had an exposure in the last 2 months.   Patient is medically clear for psychiatry evaluation. Anticipate inpatient treatment.  ____________________________________________  FINAL CLINICAL IMPRESSION(S) / ED DIAGNOSES  Final diagnoses:  Alcohol abuse with alcohol-induced mood disorder (HCC)     MEDICATIONS GIVEN DURING THIS VISIT:  None  NEW OUTPATIENT MEDICATIONS STARTED DURING THIS VISIT:  None   Note:  This document was prepared using Dragon voice recognition software and may include unintentional dictation errors.  Alona BeneJoshua Eleno Weimar, MD Emergency Medicine   Maia PlanJoshua G Lizzeth Meder, MD 02/03/16 [phone]

## 2016-02-02 NOTE — Progress Notes (Signed)
[facility]. During assessment pt endorse SI with a plan to hang himself. Pt hopeless over his crack cocaine use and inability to stop using drugs. His mood is flat, guarded and depressed. [facility]. At this time pt was able to contract for safety.

## 2016-02-02 NOTE — ED Triage Notes (Addendum)
Patient reports suicidal ideations. Patient states he has a plan to hang himself. Patient states he tried to hang himself before, "but it didn't work." Reports cocaine (last used today) and oxycodone (last used last Monday) use. Patient reports auditory hallucinations. States he is hearing voices telling him to kill himself.

## 2016-02-02 NOTE — Progress Notes (Signed)
02/02/16 1358:  LRT went to pt room to offer activities, pt was sleep.  Caroll RancherMarjette Airabella Barley, LRT/CTRS

## 2016-02-02 NOTE — Progress Notes (Signed)
Pt awake and oriented eating a snack.  Pt is cooperative and friendly.  Pt makes good eye contact and denies any pain or discomfort.  Pt denies SI, HI and AVH and contracts for safety. Pt offered support and encouragement Pt remains safe on unit

## 2016-02-02 NOTE — BH Assessment (Signed)
[facility] Assessment Progress Note  Per Franklin MinsMojeed Akintayo, MD, this pt would benefit [facility] at this [facility].  Franklin Heinrichina Tate, RN, [facility] Obs [facility].  Pt presents under IVC initiated by EDP Alona BeneJoshua Long, MD, which EDP Donnetta HutchingBrian Cook, MD has rescinded.  Pt has signed Voluntary Admission and Consent for Treatment, as well as Consent to Release Information to no one, and signed [facility]  Pt's nurse, Franklin [name], has been notified, and agrees to send original paperwork along with pt via [facility], and to call report to [phone] or [phone].  Franklin Canninghomas Oneta Sigman, MA Triage Specialist [phone]

## 2016-02-03 DIAGNOSIS — Z8249 Family history of ischemic heart disease and other diseases of the circulatory system: Secondary | ICD-10-CM

## 2016-02-03 DIAGNOSIS — Z833 Family history of diabetes mellitus: Secondary | ICD-10-CM

## 2016-02-03 DIAGNOSIS — F411 Generalized anxiety disorder: Secondary | ICD-10-CM

## 2016-02-03 DIAGNOSIS — Z79899 Other long term (current) drug therapy: Secondary | ICD-10-CM

## 2016-02-03 DIAGNOSIS — F1994 Other psychoactive substance use, unspecified with psychoactive substance-induced mood disorder: Secondary | ICD-10-CM

## 2016-02-03 DIAGNOSIS — F332 Major depressive disorder, recurrent severe without psychotic features: Secondary | ICD-10-CM

## 2016-02-03 DIAGNOSIS — R45851 Suicidal ideations: Secondary | ICD-10-CM

## 2016-02-03 DIAGNOSIS — F17210 Nicotine dependence, cigarettes, uncomplicated: Secondary | ICD-10-CM

## 2016-02-03 DIAGNOSIS — F1014 Alcohol abuse with alcohol-induced mood disorder: Secondary | ICD-10-CM

## 2016-02-03 LAB — HIV ANTIBODY (ROUTINE TESTING W REFLEX): HIV Screen 4th Generation wRfx: NONREACTIVE

## 2016-02-03 MED ORDER — SERTRALINE HCL 50 MG PO TABS
50.0000 mg | ORAL_TABLET | Freq: Every day | ORAL | Status: DC
  Administered 2016-02-03 – 2016-02-04 (×2): 50 mg via ORAL
  Filled 2016-02-03 (×2): qty 7
  Filled 2016-02-03 (×2): qty 1

## 2016-02-03 MED ORDER — THIAMINE HCL 100 MG/ML IJ SOLN: 100.0000 mg | Freq: Once | INTRAMUSCULAR | Status: DC

## 2016-02-03 MED ORDER — LORAZEPAM 1 MG PO TABS
1.0000 mg | ORAL_TABLET | Freq: Four times a day (QID) | ORAL | Status: DC | PRN
  Administered 2016-02-03: 1 mg via ORAL
  Filled 2016-02-03: qty 1

## 2016-02-03 MED ORDER — GABAPENTIN 100 MG PO CAPS
200.0000 mg | ORAL_CAPSULE | Freq: Three times a day (TID) | ORAL | Status: DC
  Administered 2016-02-03 – 2016-02-04 (×3): 200 mg via ORAL
  Filled 2016-02-03: qty 2
  Filled 2016-02-03: qty 42
  Filled 2016-02-03 (×2): qty 2

## 2016-02-03 MED ORDER — LORAZEPAM 1 MG PO TABS: 1.0000 mg | ORAL_TABLET | Freq: Every day | ORAL | Status: DC

## 2016-02-03 MED ORDER — ADULT MULTIVITAMIN W/MINERALS CH
1.0000 | ORAL_TABLET | Freq: Every day | ORAL | Status: DC
  Administered 2016-02-03 – 2016-02-04 (×2): 1 via ORAL
  Filled 2016-02-03 (×2): qty 1

## 2016-02-03 MED ORDER — LORAZEPAM 1 MG PO TABS: 1.0000 mg | ORAL_TABLET | Freq: Four times a day (QID) | ORAL | Status: DC

## 2016-02-03 MED ORDER — LORAZEPAM 1 MG PO TABS: 1.0000 mg | ORAL_TABLET | Freq: Two times a day (BID) | ORAL | Status: DC

## 2016-02-03 MED ORDER — LORAZEPAM 1 MG PO TABS: 1.0000 mg | ORAL_TABLET | Freq: Three times a day (TID) | ORAL | Status: DC

## 2016-02-03 MED ORDER — VITAMIN B-1 100 MG PO TABS
100.0000 mg | ORAL_TABLET | Freq: Every day | ORAL | Status: DC
  Administered 2016-02-04: 100 mg via ORAL
  Filled 2016-02-03: qty 1

## 2016-02-03 MED ORDER — PANTOPRAZOLE SODIUM 20 MG PO TBEC
20.0000 mg | DELAYED_RELEASE_TABLET | Freq: Every day | ORAL | Status: DC
  Administered 2016-02-03 – 2016-02-04 (×2): 20 mg via ORAL
  Filled 2016-02-03 (×2): qty 1
  Filled 2016-02-03: qty 7

## 2016-02-03 MED ORDER — QUETIAPINE FUMARATE 100 MG PO TABS
100.0000 mg | ORAL_TABLET | Freq: Every day | ORAL | Status: DC
  Administered 2016-02-03: 100 mg via ORAL
  Filled 2016-02-03: qty 1
  Filled 2016-02-03: qty 7

## 2016-02-03 NOTE — Progress Notes (Signed)
Pt blood pressure 92/49 with pulse 63. Pt denies dizziness and is asymptomatic. Re-checked and pt's b/p 94/49. NP Conrad notified. Clonidine held.

## 2016-02-03 NOTE — Progress Notes (Signed)
Pt spent most of the day sleeping between intervals. Had complaints of anxiety and is somewhat agitated when he has to be woken up to receive meds. Pt continues to endorse passive SI without a plan. Denies HI/AVH. Appetite good. In no apparent distress.

## 2016-02-03 NOTE — Progress Notes (Signed)
This Clinical research associatewriter proceeded to call [facility] after pt provided this Clinical research associatewriter with written and verbal consent to release information. An [facility] representative informed this Clinical research associatewriter that there were no available beds today or tomorrow. An [facility] intake specialist informed this Clinical research associatewriter to still fax information and inform the pt to call at 9:00 am tomorrow morning (02/04/16) to speak with someone. The [facility] representative informed this writer that the pt needs to try and call at 9:00 am every morning until a bed becomes available. This Clinical research associatewriter informed the pt about the conversation with [facility]. Pt was receptive to this information and gave this Clinical research associatewriter further permission to fax over information. This pt is also aware that he needs to call [facility] at 9:00.

## 2016-02-03 NOTE — H&P (Signed)
[facility] OBS UNIT H&P   Patient Identification: Demetres Prochnow MRN:  [id number] Principal Diagnosis: Alcohol abuse with alcohol-induced mood disorder [facility]) Diagnosis:   Patient Active Problem List   Diagnosis Date Noted  . Alcohol abuse with alcohol-induced mood disorder ([place]) [F10.14] 02/02/2016    Priority: High  . Major depressive disorder, recurrent, severe without psychotic features ([place]) [F33.2]     Priority: High  . Generalized anxiety disorder [F41.1]   . Substance abuse [F19.10] 03/22/2015  . Tobacco use disorder [F17.200] 10/06/2014  . Major depressive disorder, recurrent episode, severe, with psychotic behavior ([place]) [F33.3] 10/05/2014  . Cocaine use disorder, severe, dependence ([place]) [F14.20] 10/02/2014  . Alcohol use disorder, severe, dependence ([place]) [F10.20] 06/18/2014  . Substance induced mood disorder ([place]) [F19.94] 03/14/2013  . Polysubstance abuse [F19.10] 04/08/2012    Total Time spent with patient: 45 minutes  Subjective:   Merlen Gurry is a 46 y.o. male patient admitted with reports of suicidal ideation, polysubstance abuse with dependence with alcohol/cocaine/opiates, and depression. Pt seen and chart reviewed. Pt is alert/oriented x4, calm, cooperative, and appropriate to situation. Pt denies homicidal ideation and does not appear to be responding to internal stimuli. However, he does report that he hears voices sometimes at night, worse when he is using drugs. Pt reports that he would like long-term alcohol rehab and to be restarted on his medications.   HPI:  I have reviewed and concur with HPI elements below, modified as follows:  "Dandrae Kustra is an 46 y.o. male that presents this date under IVC. IVC states: "Patient presents with auditory hallucinations and thoughts of suicide with a plan to hang himself." Patient reports he has currently been residing with his mother and has started to experience increased DeBary stating he hears voices that are telling him to "go  ahead and kill yourself." Patient denies any current medication regimen although reports he has been seen at [facility] "a few months ago." Patient's last inpatient admission was in 2017 at [facility] and failed to follow up with his discharge plan to receive OP services at [facility] to assist with medication management and SA issues. Patient states he does not have transportation and went there "only once." Patient is vague in reference to history and is a poor historian. Patient presents with appropriate affect but speaks in a low voice. Patient is drowsy as this Probation officer attempts to ask assessment questions several times. Patient is difficult to redirect and slow to respond to this writer's questions. Patient does report ongoing SA use stating he uses Cocaine (Crack) two or three times a month (up to 1 gram or more). Patient reports last use sometime "last week." Patient is vague in reference to use/history. Patient also reports daily Opiate use (different Opiates) sating he "snorts" all he can get. Patient reports last use on 02/01/16 (unknown amount) along with 4 12 oz beers. Patient reports consuming 3 or 4 12 oz beers every day. Patient reports current withdrawals to include tremors and agitation. Patient also reports current AH stating he is hearing voices (command) that are telling him to harm himself. Patient stated he had a plan to hang himself but was vague in reference to details. Patient reports one prior attempt at self harm but cannot recall details/dates. Patient is oriented to time/place and denies any VH or HI. Patient does report ongoing depression with symptoms to include: feelings of guilt and hopelessness. Patient will not disclose his recent stressors but states that he is "stressed over everything." Case was staffed  with Reita Cliche DNP who recommended patient be re-evaluated in the a.m."  Pt arrived late last night to [place]. Seen today on 02/03/2016 as above.   Past Psychiatric History: polysubstance  abuse, MDD, GAD, insomnia  Risk to Self: Is patient at risk for suicide?: Yes Risk to Others:   Prior Inpatient Therapy:   Prior Outpatient Therapy:    Past Medical History:  Past Medical History:  Diagnosis Date  . Anxiety   . Bipolar 1 disorder ([place])   . Depression   . No pertinent past medical history   . Schizophrenia [facility])     Past Surgical History:  Procedure Laterality Date  . NO PAST SURGERIES     Family History:  Family History  Problem Relation Age of Onset  . Hypertension Other   . Diabetes Other    Family Psychiatric  History: denies Social History:  History  Alcohol Use  . 7.2 oz/week  . 12 Cans of beer per week    Comment: He drinks one 12 pack of beer per day. There is a long history of alcohol use.     History  Drug Use  . Frequency: 2.0 times per week  . Types: "Crack" cocaine, Cocaine    Comment: last use today    Social History   Social History  . Marital status: Single    Spouse name: N/A  . Number of children: N/A  . Years of education: N/A   Occupational History  . [employer]    Social History Main Topics  . Smoking status: Current Every Day Smoker    Packs/day: 0.50    Types: Cigarettes  . Smokeless tobacco: Never Used  . Alcohol use 7.2 oz/week    12 Cans of beer per week     Comment: He drinks one 12 pack of beer per day. There is a long history of alcohol use.  . Drug use: Yes    Frequency: 2.0 times per week    Types: "Crack" cocaine, Cocaine     Comment: last use today  . Sexual activity: Yes   Other Topics Concern  . Not on file   Social History Narrative   The patient was born and raised in with that by both his biological parents. His father's past way but his mother still living. He denies any history of any physical or sexual abuse. He says he completed 2 years of college at [employer]. 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 He has never been  married but has a 34 year old son who lives with his mother. He says he does get to see his son and has a relationship with him. He is not currently dating or in a relationship.      The patient does have a history of a DUI and had a court date last Friday for a DUI. He denies any other pending charges.               Additional Social History:    Allergies:  No Known Allergies  Labs:  Results for orders placed or performed during the hospital encounter of 02/02/16 (from the past 48 hour(s))  Comprehensive metabolic panel     Status: Abnormal   Collection Time: 02/02/16  8:43 AM  Result Value Ref Range   Sodium 141 135 - 145 mmol/L   Potassium 3.8 3.5 - 5.1 mmol/L   Chloride 105 101 -  111 mmol/L   CO2 26 22 - 32 mmol/L   Glucose, Bld 85 65 - 99 mg/dL   BUN 10 6 - 20 mg/dL   Creatinine, Ser 1.00 0.61 - 1.24 mg/dL   Calcium 9.4 8.9 - 10.3 mg/dL   Total Protein 8.1 6.5 - 8.1 g/dL   Albumin 4.9 3.5 - 5.0 g/dL   AST 22 15 - 41 U/L   ALT 22 17 - 63 U/L   Alkaline Phosphatase 55 38 - 126 U/L   Total Bilirubin 1.9 (H) 0.3 - 1.2 mg/dL   GFR calc non Af Amer >60 >60 mL/min   GFR calc Af Amer >60 >60 mL/min    Comment: (NOTE) The eGFR has been calculated using the CKD EPI equation. This calculation has not been validated in all clinical situations. eGFR's persistently <60 mL/min signify possible Chronic Kidney Disease.    Anion gap 10 5 - 15  Ethanol     Status: Abnormal   Collection Time: 02/02/16  8:43 AM  Result Value Ref Range   Alcohol, Ethyl (B) 71 (H) <5 mg/dL    Comment:        LOWEST DETECTABLE LIMIT FOR SERUM ALCOHOL IS 5 mg/dL FOR MEDICAL PURPOSES ONLY   Salicylate level     Status: None   Collection Time: 02/02/16  8:43 AM  Result Value Ref Range   Salicylate Lvl <8.3 2.8 - 30.0 mg/dL  Acetaminophen level     Status: Abnormal   Collection Time: 02/02/16  8:43 AM  Result Value Ref Range   Acetaminophen (Tylenol), Serum <10 (L) 10 - 30 ug/mL    Comment:         THERAPEUTIC CONCENTRATIONS VARY SIGNIFICANTLY. A RANGE OF 10-30 ug/mL MAY BE AN EFFECTIVE CONCENTRATION FOR MANY PATIENTS. HOWEVER, SOME ARE BEST TREATED AT CONCENTRATIONS OUTSIDE THIS RANGE. ACETAMINOPHEN CONCENTRATIONS >150 ug/mL AT 4 HOURS AFTER INGESTION AND >50 ug/mL AT 12 HOURS AFTER INGESTION ARE OFTEN ASSOCIATED WITH TOXIC REACTIONS.   cbc     Status: None   Collection Time: 02/02/16  8:43 AM  Result Value Ref Range   WBC 6.7 4.0 - 10.5 K/uL   RBC 5.11 4.22 - 5.81 MIL/uL   Hemoglobin 15.1 13.0 - 17.0 g/dL   HCT 44.0 39.0 - 52.0 %   MCV 86.1 78.0 - 100.0 fL   MCH 29.5 26.0 - 34.0 pg   MCHC 34.3 30.0 - 36.0 g/dL   RDW 14.2 11.5 - 15.5 %   Platelets 290 150 - 400 K/uL  HIV antibody     Status: None   Collection Time: 02/02/16  8:43 AM  Result Value Ref Range   HIV Screen 4th Generation wRfx Non Reactive Non Reactive    Comment: (NOTE) Performed At: [facility] [place], [place] [id number] Lindon Romp MD [id number]     Current Facility-Administered Medications  Medication Dose Route Frequency Provider Last Rate Last Dose  . acetaminophen (TYLENOL) tablet 650 mg  650 mg Oral Q6H PRN Patrecia Pour, NP      . alum & mag hydroxide-simeth (MAALOX/MYLANTA) 200-200-20 MG/5ML suspension 30 mL  30 mL Oral Q4H PRN Patrecia Pour, NP      . dicyclomine (BENTYL) tablet 20 mg  20 mg Oral Q6H PRN Patrecia Pour, NP      . gabapentin (NEURONTIN) capsule 200 mg  200 mg Oral TID Benjamine Mola, FNP      . hydrOXYzine (ATARAX/VISTARIL) tablet 25 mg  25 mg Oral Q6H PRN Patrecia Pour, NP   25 mg at 02/03/16 0768  . loperamide (IMODIUM) capsule 2-4 mg  2-4 mg Oral PRN Patrecia Pour, NP      . LORazepam (ATIVAN) tablet 1 mg  1 mg Oral Q6H PRN Benjamine Mola, FNP      . magnesium hydroxide (MILK OF MAGNESIA) suspension 30 mL  30 mL Oral Daily PRN Patrecia Pour, NP      . methocarbamol (ROBAXIN) tablet 500 mg  500 mg Oral Q8H PRN Patrecia Pour, NP    500 mg at 02/03/16 0881  . multivitamin with minerals tablet 1 tablet  1 tablet Oral Daily Benjamine Mola, FNP      . naproxen (NAPROSYN) tablet 500 mg  500 mg Oral BID PRN Patrecia Pour, NP      . ondansetron (ZOFRAN-ODT) disintegrating tablet 4 mg  4 mg Oral Q6H PRN Patrecia Pour, NP      . pantoprazole (PROTONIX) EC tablet 20 mg  20 mg Oral Daily Benjamine Mola, FNP      . QUEtiapine (SEROQUEL) tablet 100 mg  100 mg Oral QHS Benjamine Mola, FNP      . sertraline (ZOLOFT) tablet 50 mg  50 mg Oral Daily Benjamine Mola, FNP      . thiamine (B-1) injection 100 mg  100 mg Intramuscular Once Benjamine Mola, FNP      . [START ON 02/04/2016] thiamine (VITAMIN B-1) tablet 100 mg  100 mg Oral Daily Benjamine Mola, FNP        Musculoskeletal: Strength & Muscle Tone: within normal limits Gait & Station: normal Patient leans: N/A  Psychiatric Specialty Exam: Physical Exam  Review of Systems  Psychiatric/Behavioral: Positive for depression, hallucinations, substance abuse and suicidal ideas. The patient is nervous/anxious and has insomnia.   All other systems reviewed and are negative.   Blood pressure (!) 92/49, pulse 63, temperature 97.8 F (36.6 C), temperature source Oral, resp. rate 18, height _0  (1.778 m), weight 78.5 kg (173 lb), SpO2 100 %.Body mass index is 24.82 kg/m.  General Appearance: Casual and Fairly Groomed  Eye Contact:  Good  Speech:  Clear and Coherent and Normal Rate  Volume:  Normal  Mood:  Anxious and Depressed  Affect:  Appropriate, Congruent and Depressed  Thought Process:  Coherent, Goal Directed, Linear and Descriptions of Associations: Intact  Orientation:  Full (Time, Place, and Person)  Thought Content:  Focused on rehab placement  Suicidal Thoughts:  Yes.  with intent/plan  Homicidal Thoughts:  No  Memory:  Immediate;   Fair Recent;   Fair Remote;   Fair  Judgement:  Fair  Insight:  Fair  Psychomotor Activity:  Normal  Concentration:  Concentration:  Fair and Attention Span: Fair  Recall:  [employer] of Knowledge:  Fair  Language:  Fair  Akathisia:  No  Handed:    AIMS (if indicated):     Assets:  Communication Skills Desire for Improvement Resilience Social Support  ADL's:  Intact  Cognition:  WNL  Sleep:      Treatment Plan Summary: Alcohol abuse with alcohol-induced mood disorder (HCC) unstable, warrants OBS UNIT treatment and will seek voluntary rehab also, managed as below:  Medications: Restart home meds as follows: -Seroquel 179m po qhs for psychotic features/insomnia -Vistaril 261mpo q6h prn anxiety -CIWA/Ativan protocol (prn only as pt is doing well) -COWS protocol (will only order clonidine if  symptomatic as BP is trending hypotensive) -Zoloft 77m po daily for depression (home med) -Neurontin 2035mpo tid for anxiety/ETOH abuse   Disposition:  -[facility] OBS UNIT overnight, see plans above; rehab referrals in process now.  WiBenjamine MolaFNP 02/03/2016 1:54 PM

## 2016-02-03 NOTE — Progress Notes (Signed)
This writer attempted to give pt some resources on alcohol and substance abuse and pt denied some of the resources. Pt was given a resource sheet of inpatient and out patient  substance abuse facilities around the community. Pt was receptive to information between he and this Clinical research associatewriter.

## 2016-02-03 NOTE — Progress Notes (Signed)
Pt received asleep in bed. Easily aroused, guarded and irritable. Pt CIWA 9. Has complaints of body aches and anxiety. Given PRN Robaxin and Vistaril. Pt sedated on reassessment of PRN meds. Pt continues to endorse passive SI but denies HI/AVH. He was able to contract for safety.

## 2016-02-03 NOTE — Progress Notes (Signed)
D: Patient sleeping at the time of change. Staff woke patient for medication and assessment. Patient verbalizes no concern. Denies pain, SI,/HI, AH/VH at this time. No behavioral issues noted.    A: Staff offered support and encouragement to patient. Due meds given as ordered. Constant observation maintained except when patient is in the bathroom. Will continue to moniotr patient.  R: Patient remains safe. Sleeping at this time.

## 2016-02-03 NOTE — Progress Notes (Signed)
This Clinical research associatewriter presented pt with resources on substance abuse faculties that meet his criteria. Pt asked if this writer could assist him in calling some facilities today. This Clinical research associatewriter agreed to help pt. Call facilities that meet his criteria after given written and verbal consent. This Clinical research associatewriter presented pt with a "consent to release information" form to give this Clinical research associatewriter written and verbal consent to release information to a facility. Pt signed the consent and provided written as well as verbal consent to release any information. Pt was receptive of communication between he and this Clinical research associatewriter.

## 2016-02-04 ENCOUNTER — Encounter (HOSPITAL_COMMUNITY): Payer: Self-pay

## 2016-02-04 MED ORDER — PANTOPRAZOLE SODIUM 20 MG PO TBEC: 20.0000 mg | DELAYED_RELEASE_TABLET | Freq: Every day | ORAL | 0 refills | Status: DC

## 2016-02-04 MED ORDER — GABAPENTIN 100 MG PO CAPS: 200.0000 mg | ORAL_CAPSULE | Freq: Three times a day (TID) | ORAL | 0 refills | Status: DC

## 2016-02-04 MED ORDER — SERTRALINE HCL 50 MG PO TABS: 50.0000 mg | ORAL_TABLET | Freq: Every day | ORAL | 0 refills | Status: DC

## 2016-02-04 MED ORDER — HYDROXYZINE HCL 25 MG PO TABS: 25.0000 mg | ORAL_TABLET | Freq: Four times a day (QID) | ORAL | 0 refills | Status: DC | PRN

## 2016-02-04 MED ORDER — QUETIAPINE FUMARATE 100 MG PO TABS: 100.0000 mg | ORAL_TABLET | Freq: Every day | ORAL | 0 refills | Status: DC

## 2016-02-04 NOTE — Progress Notes (Signed)
Written/verbal discharge instructions,, sample medications and follow-up information given to patient with verbalization of understanding;  Patient denies suicidal and homicidal ideation. Suicide Prevention information/materials given to patient  All patient belongings returned to patient at time of discharge. Discharged home in stable condition with [facility] personnel.

## 2016-02-04 NOTE — Progress Notes (Signed)
[facility] OBSERVATION UNIT:  Family/Significant Other Suicide Prevention Education  Suicide Prevention Education:  Patient Refusal for Family/Significant Other Suicide Prevention Education: The patient Franklin [name] has refused to provide written consent for family/significant other to be provided Family/Significant Other Suicide Prevention Education during admission and/or prior to discharge.  Physician notified.  Camelia EngKaren H Annibelle Brazie 02/04/2016, 9:18 AM   Camelia EngKaren H Caralyn Twining, RN 02/04/16  9:18 AM

## 2016-02-04 NOTE — Discharge Planning (Signed)
[facility] Discharge Plan :  Will you be returning to the same living situation after discharge:  No.Patient going to [facility]  At discharge, do you have transportation home?: Yes,  [facility] will transport patient  Do you have the ability to pay for your medications: No. Sample medications to be given at discharge  Release of information consent forms completed and in the chart;  Patient's signature needed at discharge.  Patient to Follow up at: Follow-up Information    [facility] Go on 02/04/2016.   Specialty:  Addiction Medicine Why:  Pt will be transported here via [facility] for a 21 day stay for residential treatment services. Pt will also have a 21 day supply of medications. Contact information: [address] [place] [address] [phone]           Safety Planning and Suicide Prevention discussed: Yes,  Patient verbalized understanding   Camelia EngKaren H Cissy Galbreath 02/04/2016, 11:40 AM

## 2016-02-04 NOTE — [facility] Counselor (Signed)
This Clinical research associatewriter received [name] was [facility] for residential treatment. This information was reported to the pt. Pt was receptive. Pt will be picked up by 1pm and will have a 21 day supply of medications [facility].

## 2016-02-04 NOTE — Discharge Summary (Signed)
Physician Discharge Summary Note  Patient:  Franklin [name] is an 46 y.o., male MRN:  [id number] DOB:  16-Dec-1970 Patient phone:  [phone] (home)  Patient address:   [address] [address],  Total Time spent with patient: 20 minutes  Date of Admission:  02/02/2016 Date of Discharge: 02/04/2016  Reason for Admission:  Alcohol abuse   Principal Problem: Alcohol abuse with alcohol-induced mood disorder [facility]) Discharge Diagnoses: Patient Active Problem List   Diagnosis Date Noted  . Alcohol abuse with alcohol-induced mood disorder (HCC) [F10.14] 02/02/2016    Priority: High  . Major depressive disorder, recurrent, severe without psychotic features (HCC) [F33.2]     Priority: High  . Substance induced mood disorder (HCC) [F19.94] 03/14/2013    Priority: High  . Generalized anxiety disorder [F41.1]   . Substance abuse [F19.10] 03/22/2015  . Tobacco use disorder [F17.200] 10/06/2014  . Major depressive disorder, recurrent episode, severe, with psychotic behavior (HCC) [F33.3] 10/05/2014  . Cocaine use disorder, severe, dependence (HCC) [F14.20] 10/02/2014  . Alcohol use disorder, severe, dependence (HCC) [F10.20] 06/18/2014  . Polysubstance abuse [F19.10] 04/08/2012    Past Psychiatric History: MDD, Polysubstance abuse,   Past Medical History:  Past Medical History:  Diagnosis Date  . Anxiety   . Bipolar 1 disorder (HCC)   . Depression   . No pertinent past medical history   . Schizophrenia [facility])     Past Surgical History:  Procedure Laterality Date  . NO PAST SURGERIES     Family History:  Family History  Problem Relation Age of Onset  . Hypertension Other   . Diabetes Other    Family Psychiatric  History: Unknown Social History:  History  Alcohol Use  . 7.2 oz/week  . 12 Cans of beer per week    Comment: He drinks one 12 pack of beer per day. There is a long history of alcohol use.     History  Drug Use  . Frequency: 2.0 times per week  . Types:  "Crack" cocaine, Cocaine    Comment: last use today    Social History   Social History  . Marital status: Single    Spouse name: N/A  . Number of children: N/A  . Years of education: N/A   Occupational History  . [employer]    Social History Main Topics  . Smoking status: Current Every Day Smoker    Packs/day: 0.50    Types: Cigarettes  . Smokeless tobacco: Never Used     Comment: Patient refused  . Alcohol use 7.2 oz/week    12 Cans of beer per week     Comment: He drinks one 12 pack of beer per day. There is a long history of alcohol use.  . Drug use: Yes    Frequency: 2.0 times per week    Types: "Crack" cocaine, Cocaine     Comment: last use today  . Sexual activity: Yes   Other Topics Concern  . None   Social History Narrative   The patient was born and raised in with that by both his biological parents. His father's past way but his mother still living. He denies any history of any physical or sexual abuse. He says he completed 2 years of college at [employer]. 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 He has never been married but has a 53 year old son who lives with his mother. He says  he does get to see his son and has a relationship with him. He is not currently dating or in a relationship.      The patient does have a history of a DUI and had a court date last Friday for a DUI. He denies any other pending charges.                Hospital Course:  Franklin SolianSteven Ekdahl is a 46 year old male who [facility] after he was IVC'd for suicidal ideation with a plan to hang himself. Pt's UDS + cocaine and alcohol. Today,  Pt denies suicidal/homicidal ideation, denies auditory/visual hallucinations and does not appear to be responding to internal stimuli. Pt was calm and cooperative, alert & oriented, lying on bed in OBS unit and making a phone call to [facility] to check on a bed for  treatment for his substance abuse problems. Pt states " I want to get help and hopefully they will have a bed for me today." Pt initially was told there were no beds but per notes in chart, [facility] and agreed to [facility] Pt today. Pt will be given medication samples for 21 days [facility]. [facility] today at 1:00 Pm. Pt is interested and motivated to change.   Physical Findings: AIMS: Facial and Oral Movements Muscles of Facial Expression: None, normal Lips and Perioral Area: None, normal Jaw: None, normal Tongue: None, normal,Extremity Movements Upper (arms, wrists, hands, fingers): None, normal Lower (legs, knees, ankles, toes): None, normal, Trunk Movements Neck, shoulders, hips: None, normal, Overall Severity Severity of abnormal movements (highest score from questions above): None, normal Incapacitation due to abnormal movements: None, normal Patient's awareness of abnormal movements (rate only patient's report): No Awareness, Dental Status Current problems with teeth and/or dentures?: No Does patient usually wear dentures?: No  CIWA:  CIWA-Ar Total: 1 COWS:  COWS Total Score: 1  Musculoskeletal: Strength & Muscle Tone: within normal limits Gait & Station: normal Patient leans: N/A  Psychiatric Specialty Exam: Physical Exam  Constitutional: He is oriented to person, place, and time. He appears well-developed and well-nourished.  Musculoskeletal: Normal range of motion.  Neurological: He is alert and oriented to person, place, and time.  Skin: Skin is warm and dry.    Review of Systems  Psychiatric/Behavioral: Positive for depression, hallucinations, substance abuse and suicidal ideas. Negative for memory loss. The patient is nervous/anxious. The patient does not have insomnia.     Blood pressure 104/68, pulse 60, temperature 97.9 F (36.6 C), temperature source Oral, resp. rate 16, height 5\' 10"  (1.778 m), weight 78.5 kg  (173 lb), SpO2 100 %.Body mass index is 24.82 kg/m.  General Appearance: Casual  Eye Contact:  Fair  Speech:  Clear and Coherent and Normal Rate  Volume:  Normal  Mood:  Depressed  Affect:  Congruent  Thought Process:  Coherent, Goal Directed and Linear  Orientation:  Full (Time, Place, and Person)  Thought Content:  Logical  Suicidal Thoughts:  No, denies today  Homicidal Thoughts:  No  Memory:  Immediate;   Good Recent;   Good Remote;   Fair  Judgement:  Good  Insight:  Good  Psychomotor Activity:  Normal  Concentration:  Concentration: Good and Attention Span: Good  Recall:  Good  Fund of Knowledge:  Good  Language:  Good  Akathisia:  No  Handed:  Right  AIMS (  if indicated):     Assets:  Communication Skills Desire for Improvement Housing Physical Health Resilience Social Support  ADL's:  Intact  Cognition:  WNL  Sleep:           Has this patient used any form of tobacco in the last 30 days? (Cigarettes, Smokeless Tobacco, Cigars, and/or Pipes)No  Blood Alcohol level:  Lab Results  Component Value Date   ETH 71 (H) 02/02/2016   ETH 54 (H) 11/23/2015    Metabolic Disorder Labs:  Lab Results  Component Value Date   HGBA1C 5.5 10/08/2014   MPG 117 02/02/2009   No results found for: PROLACTIN Lab Results  Component Value Date   CHOL 159 10/08/2014   TRIG 255 (H) 10/08/2014   HDL 45 10/08/2014   CHOLHDL 3.5 10/08/2014   VLDL 51 (H) 10/08/2014   LDLCALC 63 10/08/2014   LDLCALC  07/27/2009    83        Total Cholesterol/HDL:CHD Risk Coronary Heart Disease Risk Table                     Men   Women  1/2 Average Risk   3.4   3.3  Average Risk       5.0   4.4  2 X Average Risk   9.6   7.1  3 X Average Risk  23.4   11.0        Use the calculated Patient Ratio above and the CHD Risk Table to determine the patient's CHD Risk.        ATP III CLASSIFICATION (LDL):  <100     mg/dL   Optimal  161-096  mg/dL   Near or Above                    Optimal   130-159  mg/dL   Borderline  045-409  mg/dL   High  >811     mg/dL   Very High    See Psychiatric Specialty Exam and Suicide Risk Assessment completed by Attending Physician prior to discharge.  Discharge destination:  [facility]  Is patient on multiple antipsychotic therapies at discharge:  No   Has Patient had three or more failed trials of antipsychotic monotherapy by history:  No  Recommended Plan for Multiple Antipsychotic Therapies: NA   Allergies as of 02/04/2016   No Known Allergies     Medication List    TAKE these medications     Indication  gabapentin 100 MG capsule Commonly known as:  NEURONTIN Take 2 capsules (200 mg total) by mouth 3 (three) times daily.  Indication:  Cocaine Dependence   hydrOXYzine 25 MG tablet Commonly known as:  ATARAX/VISTARIL Take 1 tablet (25 mg total) by mouth every 6 (six) hours as needed for anxiety. What changed:  when to take this  Indication:  Anxiety Neurosis   pantoprazole 20 MG tablet Commonly known as:  PROTONIX Take 1 tablet (20 mg total) by mouth daily. Start taking on:  02/05/2016 What changed:  medication strength  how much to take  when to take this  Indication:  Gastroesophageal Reflux Disease   QUEtiapine 100 MG tablet Commonly known as:  SEROQUEL Take 1 tablet (100 mg total) by mouth at bedtime.  Indication:  Depressive Phase of Manic-Depression   sertraline 50 MG tablet Commonly known as:  ZOLOFT Take 1 tablet (50 mg total) by mouth daily. Start taking on:  02/05/2016 What changed:  medication strength  how  much to take  Indication:  Major Depressive Disorder        Follow-up recommendations:  Activity:  as tolerated Diet:  Regular Other:  Follow up with outpatient resources as provided for substance abuse recovery  Stay well hydrated Get plenty of rest Follow up with a PCP for any new or existing medical problems.  Comments:  Discharge to [facility]  Signed: Laveda Abbe, NP 02/04/2016,  11:39 AM

## 2016-02-04 NOTE — BHH Counselor (Signed)
This Clinical research associatewriter called [facility] and spoke with Shayla (Intake Specialist) who informed this Clinical research associatewriter that there are currently no beds available and to try again on tomorrow. This information was reported to pt.

## 2016-02-04 NOTE — Progress Notes (Addendum)
D:  Patient awake and alert; oriented x 4; he denies suicidal and homicidal ideation and visual hallucinations; he endorses auditory hallucinations; no self-injurious behaviors noted or reported. A:  Medications given as scheduled;  Emotional support provided; encouraged him to seek assistance with needs/concerns. R:  Safety maintained on unit.

## 2016-02-04 NOTE — BHH Counselor (Signed)
This Clinical research associatewriter received verbal/written consent from this pt to submit supporting documentation for treatment at [facility]. This Clinical research associatewriter faxed over information to [employer] @ [facility].

## 2016-02-04 NOTE — BHH Counselor (Addendum)
This Clinical research associatewriter received a call [facility] [place] [facility] in [facility] this [facility]. Shayla informed this Clinical research associatewriter that she may have a bed for this pt and wanted to conduct a phone interview with this pt. Pt was receptive and participated in the interview.  Drema PryShayla reports that she is going to run this referral by the triage nurses and call back with an answer. This Clinical research associatewriter also provided [employer]ks[employer]athawayShayla with pt's most recent set of vitals.

## 2016-10-13 ENCOUNTER — Encounter (HOSPITAL_COMMUNITY): Payer: Self-pay | Admitting: Emergency Medicine

## 2016-10-13 ENCOUNTER — Emergency Department (HOSPITAL_COMMUNITY)
Admission: EM | Admit: 2016-10-13 | Discharge: 2016-10-13 | Disposition: A | Discharge status: Home / Self Care | Payer: Self-pay | Attending: Emergency Medicine | Admitting: Emergency Medicine

## 2016-10-13 DIAGNOSIS — Z79899 Other long term (current) drug therapy: Secondary | ICD-10-CM | POA: Insufficient documentation

## 2016-10-13 DIAGNOSIS — F1010 Alcohol abuse, uncomplicated: Secondary | ICD-10-CM | POA: Insufficient documentation

## 2016-10-13 DIAGNOSIS — F17210 Nicotine dependence, cigarettes, uncomplicated: Secondary | ICD-10-CM | POA: Insufficient documentation

## 2016-10-13 DIAGNOSIS — F319 Bipolar disorder, unspecified: Secondary | ICD-10-CM | POA: Insufficient documentation

## 2016-10-13 DIAGNOSIS — F1490 Cocaine use, unspecified, uncomplicated: Secondary | ICD-10-CM | POA: Insufficient documentation

## 2016-10-13 DIAGNOSIS — F209 Schizophrenia, unspecified: Secondary | ICD-10-CM | POA: Insufficient documentation

## 2016-10-13 LAB — COMPREHENSIVE METABOLIC PANEL
ALT: 65 U/L — ABNORMAL HIGH (ref 17–63)
AST: 47 U/L — ABNORMAL HIGH (ref 15–41)
Albumin: 4.4 g/dL (ref 3.5–5.0)
Alkaline Phosphatase: 65 U/L (ref 38–126)
Anion gap: 12 (ref 5–15)
BUN: 9 mg/dL (ref 6–20)
CO2: 25 mmol/L (ref 22–32)
Calcium: 9.3 mg/dL (ref 8.9–10.3)
Chloride: 100 mmol/L — ABNORMAL LOW (ref 101–111)
Creatinine, Ser: 0.9 mg/dL (ref 0.61–1.24)
GFR calc Af Amer: >60 mL/min (ref >60)
GFR calc non Af Amer: >60 mL/min (ref >60)
Glucose, Bld: 100 mg/dL — ABNORMAL HIGH (ref 65–99)
Potassium: 4 mmol/L (ref 3.5–5.1)
Sodium: 137 mmol/L (ref 135–145)
Total Bilirubin: 1.6 mg/dL — ABNORMAL HIGH (ref 0.3–1.2)
Total Protein: 7.4 g/dL (ref 6.5–8.1)

## 2016-10-13 LAB — RAPID URINE DRUG SCREEN, HOSP PERFORMED
Amphetamines: NOT DETECTED
Barbiturates: NOT DETECTED
Benzodiazepines: NOT DETECTED
Cocaine: POSITIVE — AB
Opiates: NOT DETECTED
Tetrahydrocannabinol: NOT DETECTED

## 2016-10-13 LAB — CBC
HCT: 43.5 % (ref 39.0–52.0)
Hemoglobin: 15.4 g/dL (ref 13.0–17.0)
MCH: 30.4 pg (ref 26.0–34.0)
MCHC: 35.4 g/dL (ref 30.0–36.0)
MCV: 86 fL (ref 78.0–100.0)
Platelets: 327 10*3/uL (ref 150–400)
RBC: 5.06 MIL/uL (ref 4.22–5.81)
RDW: 14.3 % (ref 11.5–15.5)
WBC: 7.5 10*3/uL (ref 4.0–10.5)

## 2016-10-13 LAB — ETHANOL: Alcohol, Ethyl (B): <10 mg/dL (ref <10)

## 2016-10-13 MED ORDER — CHLORDIAZEPOXIDE HCL 25 MG PO CAPS: ORAL_CAPSULE | ORAL | 0 refills | Status: DC

## 2016-10-13 NOTE — ED Provider Notes (Signed)
[facility]-EMERGENCY DEPT Provider Note   CSN: [id number] Arrival date & time: 10/13/16  4098     History   Chief Complaint Chief Complaint  Patient presents with  . Alcohol Problem    HPI Franklin [name] is a 46 y.o. male.  The history is provided by the patient. No language interpreter was used.  Alcohol Problem    Franklin [name] is a 46 y.o. male who presents to the Emergency Department complaining of alcohol problem.  He presents for evaluation of alcohol problem and seeking detox. He has a history of depression and alcohol as well as crack cocaine abuse. He has been drinking alcohol since the age of 16. He drinks beer or wine and liquor regularly. One month ago he completed rehabilitation [facility] and as soon as he left he relapsed. Last use of alcohol was last night. He does have a history of alcohol withdrawal in the past. He denies any medical problems but does endorse feelings of depression with no active SI. He states he is supposed to be on medications for depression but is not currently being treated. He wants help with quitting alcohol and crack cocaine. Symptoms are moderate, constant, worsening. Past Medical History:  Diagnosis Date  . Anxiety   . Bipolar 1 disorder (HCC)   . Depression   . No pertinent past medical history   . Schizophrenia [facility])     Patient Active Problem List   Diagnosis Date Noted  . Alcohol abuse with alcohol-induced mood disorder (HCC) 02/02/2016  . Generalized anxiety disorder   . Substance abuse (HCC) 03/22/2015  . Tobacco use disorder 10/06/2014  . Major depressive disorder, recurrent episode, severe, with psychotic behavior (HCC) 10/05/2014  . Cocaine use disorder, severe, dependence (HCC) 10/02/2014  . Major depressive disorder, recurrent, severe without psychotic features (HCC)   . Alcohol use disorder, severe, dependence (HCC) 06/18/2014  . Substance induced mood disorder (HCC) 03/14/2013  . Polysubstance abuse (HCC) 04/08/2012     Past Surgical History:  Procedure Laterality Date  . NO PAST SURGERIES         Home Medications    Prior to Admission medications   Medication Sig Start Date End Date Taking? Authorizing Provider  gabapentin (NEURONTIN) 100 MG capsule Take 2 capsules (200 mg total) by mouth 3 (three) times daily. 02/04/16  Yes Laveda Abbe, NP  hydrOXYzine (ATARAX/VISTARIL) 25 MG tablet Take 1 tablet (25 mg total) by mouth every 6 (six) hours as needed for anxiety. 02/04/16  Yes Laveda Abbe, NP  Multiple Vitamin (MULTIVITAMIN WITH MINERALS) TABS tablet Take 1 tablet by mouth daily.   Yes [provider]  pantoprazole (PROTONIX) 20 MG tablet Take 1 tablet (20 mg total) by mouth daily. 02/05/16  Yes Laveda Abbe, NP  sertraline (ZOLOFT) 50 MG tablet Take 1 tablet (50 mg total) by mouth daily. 02/05/16  Yes Laveda Abbe, NP  chlordiazePOXIDE (LIBRIUM) 25 MG capsule  PO TID x 1D, then 25-50mg  PO BID X 1D, then 25-50mg  PO QD X 1D 10/13/16   Tilden Fossa, MD  QUEtiapine (SEROQUEL) 100 MG tablet Take 1 tablet (100 mg total) by mouth at bedtime. Patient not taking: Reported on 10/13/2016 02/04/16   Laveda Abbe, NP    Family History Family History  Problem Relation Age of Onset  . Hypertension Other   . Diabetes Other     Social History Social History  Substance Use Topics  . Smoking status: Current Every Day Smoker  Packs/day: 0.50    Types: Cigarettes  . Smokeless tobacco: Never Used     Comment: Patient refused  . Alcohol use 7.2 oz/week    12 Cans of beer per week     Comment: He drinks one 12 pack of beer per day. There is a long history of alcohol use.     Allergies   Patient has no known allergies.   Review of Systems Review of Systems  All other systems reviewed and are negative.    Physical Exam Updated Vital Signs BP (!) 142/89   Pulse 85   Temp 98.1 F (36.7 C)   Resp 14   Ht  (1.778 m)   Wt 79.4 kg  (175 lb)   SpO2 95%   BMI 25.11 kg/m   Physical Exam  Constitutional: He is oriented to person, place, and time. He appears well-developed and well-nourished.  HENT:  Head: Normocephalic and atraumatic.  Cardiovascular: Normal rate and regular rhythm.   No murmur heard. Pulmonary/Chest: Effort normal and breath sounds normal. No respiratory distress.  Abdominal: Soft. There is no tenderness. There is no rebound and no guarding.  Musculoskeletal: He exhibits no edema or tenderness.  Neurological: He is alert and oriented to person, place, and time.  Skin: Skin is warm and dry.  Psychiatric:  Flat affect  Nursing note and vitals reviewed.    ED Treatments / Results  Labs (all labs ordered are listed, but only abnormal results are displayed) Labs Reviewed  COMPREHENSIVE METABOLIC PANEL - Abnormal; Notable for the following:       Result Value   Chloride 100 (*)    Glucose, Bld 100 (*)    AST 47 (*)    ALT 65 (*)    Total Bilirubin 1.6 (*)    All other components within normal limits  RAPID URINE DRUG SCREEN, HOSP PERFORMED - Abnormal; Notable for the following:    Cocaine POSITIVE (*)    All other components within normal limits  ETHANOL  CBC    EKG  EKG Interpretation None       Radiology No results found.  Procedures Procedures (including critical care time)  Medications Ordered in ED Medications - No data to display   Initial Impression / Assessment and Plan / ED Course  I have reviewed the triage vital signs and the nursing notes.  Pertinent labs & imaging results that were available during my care of the patient were reviewed by me and considered in my medical decision making (see chart for details).     Patient with history of alcohol and crack cocaine abuse here seeking detox. He is not actively M withdrawals in the department. He does endorse feelings of depression and not currently on any depression medications. He denies any SI or HI. He has  been medically cleared for alcohol treatment. He's been evaluated with [facility] with plans for outpatient alcohol treatment. We'll provide Librium taper for symptom control. Discussed with patient outpatient follow up and return to percussion.  Final Clinical Impressions(s) / ED Diagnoses   Final diagnoses:  Alcohol abuse    New Prescriptions New Prescriptions   CHLORDIAZEPOXIDE (LIBRIUM) 25 MG CAPSULE     PO TID x 1D, then 25-50mg  PO BID X 1D, then 25-50mg  PO QD X 1D     Tilden Fossa, MD 10/13/16 1426

## 2016-10-13 NOTE — BH Assessment (Signed)
Tele Assessment Note   Patient Name: Franklin [name] MRN: [id number] Referring Physician: Dr. Madilyn Hook Location of Patient: [facility] Location of Provider: Behavioral Health TTS Department  Franklin [name] is an 46 y.o. male. Pt denies SI/HI and AVH. Pt reports crack cocaine and alcohol use. Pt states he is currently withdrawing from alcohol. Pt states he is seeking detox and long-term SA treatment. Pt states he uses a gram of crack cocaine a day and 20-25 beers a day. Pt states he was recently D/C from [facility] 3 weeks ago. Pt states he started using shortly after D/C. Pt states he now feels he needs long-term SA treatment. Pt denies current outpatient treatment. Pt denies current mental health medication.  Pt states he is interested in the [facility] program in [place].  Shuvon, NP recommends outpatient treatment. Pt provided with inpatient SA program resources. The [facility] program was included in the resources.   Diagnosis:  F10.20 Alcohol use; F11.20 Opioid use   Past Medical History:  Past Medical History:  Diagnosis Date  . Anxiety   . Bipolar 1 disorder (HCC)   . Depression   . No pertinent past medical history   . Schizophrenia [facility])     Past Surgical History:  Procedure Laterality Date  . NO PAST SURGERIES      Family History:  Family History  Problem Relation Age of Onset  . Hypertension Other   . Diabetes Other     Social History:  reports that he has been smoking Cigarettes.  He has been smoking about 0.50 packs per day. He has never used smokeless tobacco. He reports that he drinks about 7.2 oz of alcohol per week . He reports that he uses drugs, including "Crack" cocaine and Cocaine, about 2 times per week.  Additional Social History:  Alcohol / Drug Use Pain Medications: please see mar Prescriptions: please see mar Over the Counter: please see mar History of alcohol / drug use?: Yes Longest period of sobriety (when/how long): unknown Negative Consequences of Use: Financial,  Legal, Personal relationships, Work / School Substance #1 Name of Substance 1: crack cocaine 1 - Age of First Use: unknown 1 - Amount (size/oz): gram of crack  1 - Frequency: daily 1 - Duration: ongoing 1 - Last Use / Amount: 10/13/16 Substance #2 Name of Substance 2: alcohol 2 - Age of First Use: unknown 2 - Amount (size/oz): 20-25 2 - Frequency: daily 2 - Duration: ongoing 2 - Last Use / Amount: 10/13/16  CIWA: CIWA-Ar BP: (!) 132/96 Pulse Rate: 83 Nausea and Vomiting: mild nausea with no vomiting Tactile Disturbances: none Tremor: no tremor Auditory Disturbances: not present Paroxysmal Sweats: barely perceptible sweating, palms moist Visual Disturbances: not present Anxiety: mildly anxious Headache, Fullness in Head: very mild Agitation: normal activity Orientation and Clouding of Sensorium: oriented and can do serial additions CIWA-Ar Total: 4 COWS:    PATIENT STRENGTHS: (choose at least two) Average or above average intelligence Communication skills  Allergies: No Known Allergies  Home Medications:  (Not in a hospital admission)  OB/GYN Status:  No LMP for male patient.  General Assessment Data Location of Assessment: [facility] ED TTS Assessment: In system Is this a Tele or Face-to-Face Assessment?: Tele Assessment Is this an Initial Assessment or a Re-assessment for this encounter?: Initial Assessment Marital status: Single Maiden name: NA Is patient pregnant?: No Pregnancy Status: No Living Arrangements: Parent Can pt return to current living arrangement?: Yes Admission Status: Voluntary Is patient capable of signing voluntary admission?: Yes Referral  Source: Self/Family/Friend Insurance type: SP     Crisis Care Plan Living Arrangements: Parent Legal Guardian: Other: (self) Name of Psychiatrist: NA Name of Therapist: NA  Education Status Is patient currently in school?: No Current Grade: some college Highest grade of school patient has completed:  12 Name of school: NA Contact person: NA  Risk to self with the past 6 months Suicidal Ideation: No Has patient been a risk to self within the past 6 months prior to admission? : No Suicidal Intent: No Has patient had any suicidal intent within the past 6 months prior to admission? : No Is patient at risk for suicide?: No Suicidal Plan?: No Has patient had any suicidal plan within the past 6 months prior to admission? : No Access to Means: No What has been your use of drugs/alcohol within the last 12 months?: NA Previous Attempts/Gestures: No How many times?: 0 Other Self Harm Risks: NA Triggers for Past Attempts: None known Intentional Self Injurious Behavior: None Family Suicide History: No Recent stressful life event(s): Other (Comment) (Sa) Persecutory voices/beliefs?: No Depression: Yes Depression Symptoms: Tearfulness, Feeling angry/irritable Substance abuse history and/or treatment for substance abuse?: Yes Suicide prevention information given to non-admitted patients: Not applicable  Risk to Others within the past 6 months Homicidal Ideation: No Does patient have any lifetime risk of violence toward others beyond the six months prior to admission? : No Thoughts of Harm to Others: No Current Homicidal Intent: No Current Homicidal Plan: No Access to Homicidal Means: No Identified Victim: NA History of harm to others?: No Assessment of Violence: None Noted Violent Behavior Description: NA Does patient have access to weapons?: No Criminal Charges Pending?: No Does patient have a court date: No Is patient on probation?: No  Psychosis Hallucinations: None noted Delusions: None noted  Mental Status Report Appearance/Hygiene: Unremarkable Eye Contact: Fair Motor Activity: Freedom of movement Speech: Logical/coherent Level of Consciousness: Alert Mood: Depressed Affect: Depressed Anxiety Level: Minimal Thought Processes: Coherent, Relevant Judgement:  Unimpaired Orientation: Person, Place, Time, Situation Obsessive Compulsive Thoughts/Behaviors: None  Cognitive Functioning Concentration: Normal Memory: Recent Intact, Remote Intact IQ: Average Insight: Fair Impulse Control: Fair Appetite: Fair Weight Loss: 0 Weight Gain: 0 Sleep: Decreased Total Hours of Sleep: 5 Vegetative Symptoms: None  ADLScreening [facility] Assessment Services) Patient's cognitive ability adequate to safely complete daily activities?: Yes Patient able to express need for assistance with ADLs?: Yes Independently performs ADLs?: Yes (appropriate for developmental age)  Prior Inpatient Therapy Prior Inpatient Therapy: Yes Prior Therapy Dates: 2018 Prior Therapy Facilty/Provider(s): [facility] Reason for Treatment: SA  Prior Outpatient Therapy Prior Outpatient Therapy: Yes Prior Therapy Dates: 2018 Prior Therapy Facilty/Provider(s): [facility] Reason for Treatment: SA Does patient have an ACCT team?: No Does patient have Intensive In-House Services?  : No Does patient have [facility] services? : No Does patient have P4CC services?: No  ADL Screening (condition at time of admission) Patient's cognitive ability adequate to safely complete daily activities?: Yes Does the patient have difficulty seeing, even when wearing glasses/contacts?: No Does the patient have difficulty concentrating, remembering, or making decisions?: No Patient able to express need for assistance with ADLs?: Yes Does the patient have difficulty dressing or bathing?: No Independently performs ADLs?: Yes (appropriate for developmental age) Weakness of Legs: None Weakness of Arms/Hands: None       Abuse/Neglect Assessment (Assessment to be complete while patient is alone) Physical Abuse: Denies Verbal Abuse: Denies Sexual Abuse: Denies Exploitation of patient/patient's resources: Denies Self-Neglect: Denies     Advance  Directives (For Healthcare) Does Patient Have a Medical Advance  Directive?: No    Additional Information 1:1 In Past 12 Months?: No CIRT Risk: No Elopement Risk: No Does patient have medical clearance?: Yes     Disposition:  Disposition Initial Assessment Completed for this Encounter: Yes Disposition of Patient: Outpatient treatment Type of outpatient treatment: Adult  This service was provided via telemedicine using a 2-way, interactive audio and video technology.  Names of all persons participating in this telemedicine service and their role in this encounter. Name: Assunta Found Role: NP  Name:  Role:   Name:  Role:   Name:  Role:     Deaire Mcwhirter D 10/13/2016 1:20 PM

## 2016-10-13 NOTE — ED Notes (Signed)
Spoke with TTS, TTS stated he does not meet inpatient criteria, but they will fax over information for outpatient resources.

## 2016-10-13 NOTE — ED Triage Notes (Addendum)
Pt states recent detox from alcohol and crack at [facility]. States they offered him a long term treatment plan but he declined. Dc'd Thursday and has been using since. Last use last night, drank 24 beers. Pt states he realizes he needs to detox and be inpatient. Pt states he is depressed, denies SI but slow to answer question. Please give him further medical clearance for depression. Tearful at triage.

## 2016-10-13 NOTE — ED Notes (Signed)
TTS at bedside. 

## 2016-10-20 ENCOUNTER — Emergency Department (HOSPITAL_COMMUNITY): Payer: Self-pay

## 2016-10-20 ENCOUNTER — Emergency Department (HOSPITAL_COMMUNITY)
Admission: EM | Admit: 2016-10-20 | Discharge: 2016-10-20 | Disposition: A | Discharge status: Home / Self Care | Payer: Self-pay | Attending: Emergency Medicine | Admitting: Emergency Medicine

## 2016-10-20 ENCOUNTER — Encounter (HOSPITAL_COMMUNITY): Payer: Self-pay

## 2016-10-20 DIAGNOSIS — F1410 Cocaine abuse, uncomplicated: Secondary | ICD-10-CM | POA: Insufficient documentation

## 2016-10-20 DIAGNOSIS — F1910 Other psychoactive substance abuse, uncomplicated: Secondary | ICD-10-CM

## 2016-10-20 DIAGNOSIS — M79671 Pain in right foot: Secondary | ICD-10-CM

## 2016-10-20 DIAGNOSIS — F1019 Alcohol abuse with unspecified alcohol-induced disorder: Secondary | ICD-10-CM | POA: Insufficient documentation

## 2016-10-20 DIAGNOSIS — Z79899 Other long term (current) drug therapy: Secondary | ICD-10-CM | POA: Insufficient documentation

## 2016-10-20 DIAGNOSIS — F17210 Nicotine dependence, cigarettes, uncomplicated: Secondary | ICD-10-CM | POA: Insufficient documentation

## 2016-10-20 HISTORY — DX: Other psychoactive substance abuse, uncomplicated: F19.10

## 2016-10-20 MED ORDER — NAPROXEN 500 MG PO TABS: 500.0000 mg | ORAL_TABLET | Freq: Two times a day (BID) | ORAL | 0 refills | Status: DC

## 2016-10-20 NOTE — ED Notes (Signed)
ED Provider at bedside. 

## 2016-10-20 NOTE — Discharge Instructions (Addendum)
Substance Abuse Treatment Programs ° °Intensive Outpatient Programs °[facility]     °[address]      °[place], [place]                   °[phone]      ° °[facility] °[address] °[place], [place] °[phone] ° °[facility]     °(Inpatient and outpatient)     °[address]           °[phone]   ° °[facility] °[phone] (Suboxone and Methadone) ° °[address]      °[place], [place] [address]      °336-882-2125      ° °[address] °[place], [place] °852-3033 ° °[facility] (Outpatient/Inpatient, Chemical)    °(insurance only) [phone]      °       °[facility] (Groups & Residential) °[place], [place] °[phone] ° °   °[facility]     °[address]     °[place], [place]      °[phone]      ° °[facility] (for caregivers and family) °[address] °[place], [place] °336-299-4655 ° ° ° ° ° °Residential Treatment Programs °[facility]      °[address], [place], [place] [address]  °([phone]      ° °[facility] °[address] [place], [place] [address] °[phone] ° °[facility]        °336-248-8914      ° °[facility] °1-800-659-3381 ° °[facility]             °[address]                                         °[place], [place]                                                °877-615-2722 or 336-784-9470                              ° °[facility] [place] °[address] °[place], [address] °[phone] ° °[facility]    °[address]      °, [place]     °[phone]      ° °[facility] °[address] °, [place] °336-285-9073 ° °[facility]   °[address]     °[place], [place] [address]     °[phone]      °Admissions: 8am-3pm M-F ° °[facility] °[address] °[place] °336-227-7417 ° °[facility]: Residential Program (90 Days)   °[place], [place]      °336-725-8389 or 800-758-6077    ° °[facility]: [facility] °[place], [place] °(Walk in Hours over the weekend or by referral) ° °[facility] °[address], [place], [place] [address] °([phone]) [phone] ° °Crisis Mobile: [facility]:  1-877-626-1772 (for crisis response 24 hours a day) °[facility] Hotline:      1-800-256-2452 °Outpatient Psychiatry and Counseling ° °[facility]: Mobile Crisis   Management 24 hours:  1-877-626-1772 ° °[facility] sliding scale fee and walk in schedule: M-F 8am-12pm/1pm-3pm °[address]  °[place], [place] [address] °[phone] ° °[facility] °[address] °[place], [place] [address] °336-703-9650 ° °[facility] (Formerly known [facility])- new patient walk-in appointments available Monday - Friday 8am -3pm.          °[address] °[place], [place] [address] °336-676-6840 or crisis line- 336-676-6905 ° °[facility] Intensive Outpatient Therapy Program °[address] °[place], [place] [address] °336-832-9804 ° °[facility]                  °Crisis Services      °[phone]      °[address]     °[place], [place] [address]                ° °[facility]   °[facility] °[phone] °[address] °[place], [place] [address] ° ° °[facility]          °[address]  °[place], [place] [address]       °([phone] ° °[facility] °[address] °[place], [place] [address] °336-292-1510 ° °[facility]    °[address]    °[place], [place] [address]     °336-632-3505      ° °Parish McKinney, MD     °[address]     °[place] [address]     °336-282-1251     °  °[facility] °[address] °[place] [address] ° °[facility]     °[address]     °[place], [place]      °336-542-2076      ° °[facility] °Shamsher Ahluwalia, MD °[address] °[place], [place] [address] °336-420-9558 ° °[facility]     °[address]     °[place], [place] [address]     °336-274-1237      ° °[facility] °[address] °[place], [place] [address] °336-854-4450 °Resources for Temporary Residential Assistance/Crisis Centers ° °[facility] °[facility] °M-F 8am-3pm   °[address] [place], [place] [address]   336-332-0824 °Services include: laundry, barbering, support groups, case management, phone  & computer access, showers, AA/NA mtgs, mental health/substance abuse nurse, job skills class, disability information, VA assistance, spiritual classes, etc.  ° °HOMELESS SHELTERS ° °[facility]     °[facility]   °[address], [place]     °336.271.5959       °       °[facility] (women and children)       °[address] °[place], [place] [address] °336-275-0820 °[email or web address] for application and process °Application Required ° °[facility]   °[address]    °[place] [address]     °336.886.4922       °             °[facility] °[address] °[place], [place] [address] °336.273.5572 °336-235-0363(schedule application appt.) °Application Required ° °[facility] (women only)    °[address]     °[place], [place] [address]     °336-884-1039      °  Intake starts 6pm daily Need valid ID, SSC, & Police report [facility] [place] [address] [place] [phone] Application Required  [facility] (men only)     [address]      [place], [place]     [phone]       [facility] (Pregnant women only) [address] [place], [place] [phone]  [facility]      [address]      [place], [place] [address]     [phone]             [facility] [address] [place], [place] [phone] 90 day commitment/SA/Application process  [facility](men only)     [address]     [place], [place]     [phone]       Check-in at [facility]7pm            [facility] [address] [place], [place] [address] [phone] Men/Women/Women and Children must be there by 7 pm  [facility] [place] [phone]                   X-ray showed no broken bones. Several references given for outpatient treatment of alcohol and drug problems

## 2016-10-20 NOTE — ED Provider Notes (Signed)
[facility]-EMERGENCY DEPT Provider Note   CSN: [id number] Arrival date & time: 10/20/16  7829     History   Chief Complaint Chief Complaint  Patient presents with  . detox from alcohol and drugs  . Ankle Pain    HPI Franklin [name] is a 46 y.o. male.  Patient presents with concerns of alcohol and crack cocaine usage. This is been a long-standing problem. No obvious suicidal or homicidal ideation. No obvious psychosis.  Past medical history includes anxiety, bipolar, schizophrenia, polysubstance abuse.he also complains of pain in his left medial foot.      Past Medical History:  Diagnosis Date  . Anxiety   . Bipolar 1 disorder (HCC)   . Depression   . No pertinent past medical history   . Schizophrenia (HCC)   . Substance abuse [facility])     Patient Active Problem List   Diagnosis Date Noted  . Alcohol abuse with alcohol-induced mood disorder (HCC) 02/02/2016  . Generalized anxiety disorder   . Substance abuse (HCC) 03/22/2015  . Tobacco use disorder 10/06/2014  . Major depressive disorder, recurrent episode, severe, with psychotic behavior (HCC) 10/05/2014  . Cocaine use disorder, severe, dependence (HCC) 10/02/2014  . Major depressive disorder, recurrent, severe without psychotic features (HCC)   . Alcohol use disorder, severe, dependence (HCC) 06/18/2014  . Substance induced mood disorder (HCC) 03/14/2013  . Polysubstance abuse (HCC) 04/08/2012    Past Surgical History:  Procedure Laterality Date  . NO PAST SURGERIES         Home Medications    Prior to Admission medications   Medication Sig Start Date End Date Taking? Authorizing Provider  chlordiazePOXIDE (LIBRIUM) 25 MG capsule 50mg  PO TID x 1D, then 25-50mg  PO BID X 1D, then 25-50mg  PO QD X 1D 10/13/16   Tilden Fossa, MD  gabapentin (NEURONTIN) 100 MG capsule Take 2 capsules (200 mg total) by mouth 3 (three) times daily. 02/04/16   Laveda Abbe, NP  hydrOXYzine  (ATARAX/VISTARIL) 25 MG tablet Take 1 tablet (25 mg total) by mouth every 6 (six) hours as needed for anxiety. 02/04/16   Laveda Abbe, NP  Multiple Vitamin (MULTIVITAMIN WITH MINERALS) TABS tablet Take 1 tablet by mouth daily.    [provider]  naproxen (NAPROSYN) 500 MG tablet Take 1 tablet (500 mg total) by mouth 2 (two) times daily. 10/20/16   Donnetta Hutching, MD  pantoprazole (PROTONIX) 20 MG tablet Take 1 tablet (20 mg total) by mouth daily. 02/05/16   Laveda Abbe, NP  QUEtiapine (SEROQUEL) 100 MG tablet Take 1 tablet (100 mg total) by mouth at bedtime. Patient not taking: Reported on 10/13/2016 02/04/16   Laveda Abbe, NP  sertraline (ZOLOFT) 50 MG tablet Take 1 tablet (50 mg total) by mouth daily. 02/05/16   Laveda Abbe, NP    Family History Family History  Problem Relation Age of Onset  . Hypertension Other   . Diabetes Other     Social History Social History  Substance Use Topics  . Smoking status: Current Every Day Smoker    Packs/day: 0.50    Types: Cigarettes  . Smokeless tobacco: Never Used     Comment: Patient refused  . Alcohol use 7.2 oz/week    12 Cans of beer per week     Comment: He drinks one 12 pack of beer per day. There is a long history of alcohol use.     Allergies   Patient has no known allergies.  Review of Systems Review of Systems  All other systems reviewed and are negative.    Physical Exam Updated Vital Signs BP (!) 160/98 (BP Location: Left Arm)   Pulse 94   Temp 98.6 F (37 C) (Oral)   Resp 14   Ht 5\' 10"  (1.778 m)   Wt 72.6 kg (160 lb)   SpO2 96%   BMI 22.96 kg/m   Physical Exam  Constitutional: He is oriented to person, place, and time. He appears well-developed and well-nourished.  HENT:  Head: Normocephalic and atraumatic.  Eyes: Conjunctivae are normal.  Neck: Neck supple.  Cardiovascular: Normal rate and regular rhythm.   Pulmonary/Chest: Effort normal and breath sounds normal.   Abdominal: Soft. Bowel sounds are normal.  Musculoskeletal:  Left foot: Minimal swelling and tenderness on medial aspect of mid foot. No obvious erythema.  Neurological: He is alert and oriented to person, place, and time.  Skin: Skin is warm and dry.  Psychiatric:  Flat affect  Nursing note and vitals reviewed.    ED Treatments / Results  Labs (all labs ordered are listed, but only abnormal results are displayed) Labs Reviewed - No data to display  EKG  EKG Interpretation None       Radiology Dg Ankle Complete Left  Result Date: 10/20/2016 CLINICAL DATA:  Worsening left ankle pain EXAM: LEFT ANKLE COMPLETE - 3+ VIEW COMPARISON:  None. FINDINGS: There is no evidence of fracture, dislocation, or joint effusion. There is no evidence of arthropathy or other focal bone abnormality. Soft tissues are unremarkable. IMPRESSION: Negative. Electronically Signed   By: Charlett NoseKevin  Dover M.D.   On: 10/20/2016 08:07    Procedures Procedures (including critical care time)  Medications Ordered in ED Medications - No data to display   Initial Impression / Assessment and Plan / ED Course  I have reviewed the triage vital signs and the nursing notes.  Pertinent labs & imaging results that were available during my care of the patient were reviewed by me and considered in my medical decision making (see chart for details).     Patient is here seeking guidance for alcohol and drug abuse. He is not psychotic. Will offer peer support program and treatment programs in the community. Plain films of left ankle were negative. These included views of his foot. Discharge medications Naprosyn 500 mg.  Final Clinical Impressions(s) / ED Diagnoses   Final diagnoses:  Polysubstance abuse (HCC)  Right foot pain    New Prescriptions New Prescriptions   NAPROXEN (NAPROSYN) 500 MG TABLET    Take 1 tablet (500 mg total) by mouth 2 (two) times daily.     Donnetta Hutchingook, Verenise Moulin, MD 10/20/16 [phone]

## 2016-10-20 NOTE — ED Triage Notes (Signed)
Patient wants detox from alcohol and crack. Patient also c/oleft ankle pain. Patient states he does not know if he injured his left ankle or not. Patient denies HI/SI.

## 2016-10-20 NOTE — ED Notes (Signed)
Peer Support at bedside. 

## 2016-10-20 NOTE — Patient Outreach (Signed)
ED Peer Support Specialist Patient Intake (Complete at intake & 30-60 Day Follow-up)  Name: Franklin SolianSteven [name]  MRN: [id number]  Age: 46 y.o.   Date of Admission: 10/20/2016  Intake: Initial Comments:      Primary Reason Admitted: Alcohol use/withdraws, crack cocaine use/withdraws  Lab values: Alcohol/ETOH: Positive Positive UDS? Yes Amphetamines:   Barbiturates: No Benzodiazepines: No Cocaine: Yes Opiates: No Cannabinoids: No  Demographic information: Gender: Male Ethnicity: African American Marital Status: Single Insurance Status: Uninsured/Self-pay Control and instrumentation engineereceives non-medical governmental assistance (Work Engineer, agriculturalirst/Welfare, Sales executivefood stamps, etc.: No Lives with: Counselling psychologistarent (Mother) Living situation: House/Apartment  Reported Patient History: Patient reported health conditions: None Patient aware of HIV and hepatitis status: No  In past year, has patient visited ED for any reason? Yes  Number of ED visits:    Reason(s) for visit: Alcohol intoxication   In past year, has patient been hospitalized for any reason? No  Number of hospitalizations:    Reason(s) for hospitalization:    In past year, has patient been arrested? No  Number of arrests:    Reason(s) for arrest:    In past year, has patient been incarcerated? No  Number of incarcerations:    Reason(s) for incarceration:    In past year, has patient received medication-assisted treatment?    In past year, patient received the following treatments: [place] (non-hospital[place] [place]  In past year, has patient received any harm reduction services? No  Did this include any of the following?    In past year, has patient received care from a mental health provider for diagnosis other than SUD? No  In past year, is this first time patient has overdosed? No  Number of past overdoses: 0  In past year, is this first time patient has been hospitalized for an overdose? No  Number of  hospitalizations for overdose(s): 0  Is patient currently receiving treatment for a mental health diagnosis? No  Patient reports experiencing difficulty participating in SUD treatment: No    Most important reason(s) for this difficulty?    Has patient received prior services for treatment? Yes [place] [place])  In past, patient has received services from following agencies: Other (comment) (  )  Plan of Care:  Suggested follow up at these agencies/treatment centers: Other (comment) ([facility] for detox. After detox, refer patient to a long-term substance use treatment center like [facility]. )  Other information:    Franklin BoardsJohn Letishia [name], CPSS  10/20/2016 11:39 AM

## 2016-10-20 NOTE — ED Notes (Signed)
Patient transported to X-ray 

## 2016-12-30 ENCOUNTER — Telehealth (HOSPITAL_COMMUNITY): Payer: Self-pay

## 2020-03-27 ENCOUNTER — Emergency Department
Admission: EM | Admit: 2020-03-27 | Discharge: 2020-03-27 | Disposition: A | Discharge status: Home / Self Care | Payer: Self-pay | Attending: Emergency Medicine | Admitting: Emergency Medicine

## 2020-03-27 ENCOUNTER — Other Ambulatory Visit: Payer: Self-pay

## 2020-03-27 ENCOUNTER — Emergency Department: Payer: Self-pay

## 2020-03-27 DIAGNOSIS — R0602 Shortness of breath: Secondary | ICD-10-CM | POA: Insufficient documentation

## 2020-03-27 DIAGNOSIS — R0600 Dyspnea, unspecified: Secondary | ICD-10-CM | POA: Insufficient documentation

## 2020-03-27 DIAGNOSIS — R059 Cough, unspecified: Secondary | ICD-10-CM | POA: Insufficient documentation

## 2020-03-27 DIAGNOSIS — R062 Wheezing: Secondary | ICD-10-CM | POA: Insufficient documentation

## 2020-03-27 DIAGNOSIS — J441 Chronic obstructive pulmonary disease with (acute) exacerbation: Secondary | ICD-10-CM

## 2020-03-27 DIAGNOSIS — F17210 Nicotine dependence, cigarettes, uncomplicated: Secondary | ICD-10-CM | POA: Insufficient documentation

## 2020-03-27 DIAGNOSIS — Z20822 Contact with and (suspected) exposure to covid-19: Secondary | ICD-10-CM | POA: Insufficient documentation

## 2020-03-27 LAB — CBC
HCT: 49.7 % (ref 39.0–52.0)
Hemoglobin: 16.9 g/dL (ref 13.0–17.0)
MCH: 29.8 pg (ref 26.0–34.0)
MCHC: 34 g/dL (ref 30.0–36.0)
MCV: 87.7 fL (ref 80.0–100.0)
Platelets: 298 10*3/uL (ref 150–400)
RBC: 5.67 MIL/uL (ref 4.22–5.81)
RDW: 14.8 % (ref 11.5–15.5)
WBC: 13.8 10*3/uL — ABNORMAL HIGH (ref 4.0–10.5)
nRBC: 0 % (ref 0.0–0.2)

## 2020-03-27 LAB — RESP PANEL BY RT-PCR (FLU A&B, COVID) ARPGX2
Influenza A by PCR: NEGATIVE
Influenza B by PCR: NEGATIVE
SARS Coronavirus 2 by RT PCR: NEGATIVE

## 2020-03-27 LAB — BASIC METABOLIC PANEL
Anion gap: 9 (ref 5–15)
BUN: 8 mg/dL (ref 6–20)
CO2: 22 mmol/L (ref 22–32)
Calcium: 8.7 mg/dL — ABNORMAL LOW (ref 8.9–10.3)
Chloride: 106 mmol/L (ref 98–111)
Creatinine, Ser: 0.84 mg/dL (ref 0.61–1.24)
GFR, Estimated: >60 mL/min (ref >60)
Glucose, Bld: 104 mg/dL — ABNORMAL HIGH (ref 70–99)
Potassium: 4 mmol/L (ref 3.5–5.1)
Sodium: 137 mmol/L (ref 135–145)

## 2020-03-27 LAB — BRAIN NATRIURETIC PEPTIDE: B Natriuretic Peptide: 17.2 pg/mL (ref 0.0–100.0)

## 2020-03-27 LAB — TROPONIN I (HIGH SENSITIVITY): Troponin I (High Sensitivity): 3 ng/L (ref <18)

## 2020-03-27 MED ORDER — IPRATROPIUM-ALBUTEROL 0.5-2.5 (3) MG/3ML IN SOLN
3.0000 mL | Freq: Once | RESPIRATORY_TRACT | Status: AC
  Administered 2020-03-27: 3 mL via RESPIRATORY_TRACT
  Filled 2020-03-27: qty 3

## 2020-03-27 MED ORDER — PREDNISONE 20 MG PO TABS
60.0000 mg | ORAL_TABLET | Freq: Once | ORAL | Status: AC
  Administered 2020-03-27: 60 mg via ORAL
  Filled 2020-03-27: qty 3

## 2020-03-27 MED ORDER — PREDNISONE 10 MG PO TABS: ORAL_TABLET | ORAL | 0 refills | Status: DC

## 2020-03-27 MED ORDER — ALBUTEROL SULFATE HFA 108 (90 BASE) MCG/ACT IN AERS: 2.0000 | INHALATION_SPRAY | Freq: Four times a day (QID) | RESPIRATORY_TRACT | 2 refills | Status: DC | PRN

## 2020-03-27 NOTE — ED Provider Notes (Signed)
[facility] Emergency Department Provider Note  Time seen: 12:58 PM  I have reviewed the triage vital signs and the nursing notes.   HISTORY  Chief Complaint Shortness of Breath   HPI Adrian Dinovo is a 50 y.o. male with a past medical history anxiety, bipolar, schizophrenia, presents to the emergency department for shortness of breath.  According to the patient since November he has been experiencing shortness of breath cough and mucus production.  Patient states he finally came in today because his sister kept bothering him about it.  States he gets short of breath with minimal exertion.  Denies any chest pain.  Denies any fever.  No leg pain or swelling.   Past Medical History:  Diagnosis Date  . Anxiety   . Bipolar 1 disorder (HCC)   . Depression   . No pertinent past medical history   . Schizophrenia (HCC)   . Substance abuse [facility])     Patient Active Problem List   Diagnosis Date Noted  . Alcohol abuse with alcohol-induced mood disorder (HCC) 02/02/2016  . Generalized anxiety disorder   . Substance abuse (HCC) 03/22/2015  . Tobacco use disorder 10/06/2014  . Major depressive disorder, recurrent episode, severe, with psychotic behavior (HCC) 10/05/2014  . Cocaine use disorder, severe, dependence (HCC) 10/02/2014  . Major depressive disorder, recurrent, severe without psychotic features (HCC)   . Alcohol use disorder, severe, dependence (HCC) 06/18/2014  . Substance induced mood disorder (HCC) 03/14/2013  . Polysubstance abuse (HCC) 04/08/2012    Past Surgical History:  Procedure Laterality Date  . NO PAST SURGERIES      Prior to Admission medications   Medication Sig Start Date End Date Taking? Authorizing Provider  chlordiazePOXIDE (LIBRIUM) 25 MG capsule 50mg  PO TID x 1D, then 25-50mg  PO BID X 1D, then 25-50mg  PO QD X 1D 10/13/16   12/13/16, MD  gabapentin (NEURONTIN) 100 MG capsule Take 2 capsules (200 mg total) by mouth 3 (three)  times daily. 02/04/16   02/06/16, NP  hydrOXYzine (ATARAX/VISTARIL) 25 MG tablet Take 1 tablet (25 mg total) by mouth every 6 (six) hours as needed for anxiety. 02/04/16   02/06/16, NP  Multiple Vitamin (MULTIVITAMIN WITH MINERALS) TABS tablet Take 1 tablet by mouth daily.    [provider]  naproxen (NAPROSYN) 500 MG tablet Take 1 tablet (500 mg total) by mouth 2 (two) times daily. 10/20/16   10/22/16, MD  pantoprazole (PROTONIX) 20 MG tablet Take 1 tablet (20 mg total) by mouth daily. 02/05/16   04/04/16, NP  QUEtiapine (SEROQUEL) 100 MG tablet Take 1 tablet (100 mg total) by mouth at bedtime. Patient not taking: Reported on 10/13/2016 02/04/16   02/06/16, NP  sertraline (ZOLOFT) 50 MG tablet Take 1 tablet (50 mg total) by mouth daily. 02/05/16   04/04/16, NP    No Known Allergies  Family History  Problem Relation Age of Onset  . Hypertension Other   . Diabetes Other     Social History Social History   Tobacco Use  . Smoking status: Current Every Day Smoker    Packs/day: 0.50    Types: Cigarettes  . Smokeless tobacco: Never Used  . Tobacco comment: Patient refused  Vaping Use  . Vaping Use: Never used  Substance Use Topics  . Alcohol use: Yes    Alcohol/week: 12.0 standard drinks    Types: 12 Cans of beer per week    Comment:  He drinks one 12 pack of beer per day. There is a long history of alcohol use.  . Drug use: Yes    Types: "Crack" cocaine, Cocaine    Comment: one gram daily    Review of Systems Constitutional: Negative for fever. Cardiovascular: Negative for chest pain. Respiratory: Positive for shortness of breath, worse with exertion. Gastrointestinal: Negative for abdominal pain, vomiting  Musculoskeletal: Negative for musculoskeletal complaints.  No leg pain or swelling.   Neurological: Negative for headache All other ROS  negative  ____________________________________________   PHYSICAL EXAM:  VITAL SIGNS: ED Triage Vitals  Enc Vitals Group     BP 03/27/20 1141 (!) 163/103     Pulse Rate 03/27/20 1141 100     Resp 03/27/20 1141 20     Temp 03/27/20 1141 98.6 F (37 C)     Temp Source 03/27/20 1141 Oral     SpO2 03/27/20 1141 94 %     Weight 03/27/20 1142 165 lb (74.8 kg)     Height 03/27/20 1142 5\' 10"  (1.778 m)     Head Circumference --      Peak Flow --      Pain Score 03/27/20 1155 0     Pain Loc --      Pain Edu? --      Excl. in GC? --     Constitutional: Alert and oriented. Well appearing and in no distress. Eyes: Normal exam ENT      Head: Normocephalic and atraumatic.      Mouth/Throat: Mucous membranes are moist. Cardiovascular: Normal rate, regular rhythm. No murmur Respiratory: Mild tachypnea around 20 to 22 breaths/min.  Patient does have diffuse wheeze, moderate expiratory wheeze in all lung fields.  No obvious rhonchi. Gastrointestinal: Soft and nontender. No distention.   Musculoskeletal: Nontender with normal range of motion in all extremities. No lower extremity tenderness or edema. Neurologic:  Normal speech and language. No gross focal neurologic deficits Skin:  Skin is warm, dry and intact.  Psychiatric: Mood and affect are normal.  ____________________________________________    EKG  EKG viewed and interpreted by myself shows a normal sinus rhythm at 100 bpm with a narrow QRS, normal axis, normal intervals, no concerning ST changes.  ____________________________________________    RADIOLOGY  Chest x-ray is negative.  ____________________________________________   INITIAL IMPRESSION / ASSESSMENT AND PLAN / ED COURSE  Pertinent labs & imaging results that were available during my care of the patient were reviewed by me and considered in my medical decision making (see chart for details).   Patient presents to the emergency department for shortness of  breath worsening over the past 3 months.  On examination patient has diffuse expiratory wheeze.  Patient smokes approximately 1 pack/day times the past 20 years.  Does not have a physician that he follows up with.  Highly suspect a degree of COPD or reactive airway disease such as asthma.  Reassuringly patient's basic labs are normal and chest x-ray is normal.  I have added on a troponin and BNP as a precaution.  Given the patient's diffuse wheeze highly suspect COPD we will start the patient on prednisone and dose 3 DuoNeb's and reassess.  Reassuringly patient is satting in the mid 90s on room air.  Patient is feeling better after breathing treatments.  Patient troponin and BNP have resulted normal.  Again highly suspect COPD as the underlying cause.  We will discharge patient on a prednisone taper, albuterol inhaler to be used as needed and  pulmonary follow-up for further work-up testing and ongoing treatment.  Discussed return precautions.  Patient agreeable to plan of care.  Garey Alleva was evaluated in Emergency Department on 03/27/2020 for the symptoms described in the history of present illness. He was evaluated in the context of the global COVID-19 pandemic, which necessitated consideration that the patient might be at risk for infection with the SARS-CoV-2 virus that causes COVID-19. Institutional protocols and algorithms that pertain to the evaluation of patients at risk for COVID-19 are in a state of rapid change based on information released by regulatory bodies including the CDC and federal and state organizations. These policies and algorithms were followed during the patient's care in the ED.  ____________________________________________   FINAL CLINICAL IMPRESSION(S) / ED DIAGNOSES  Dyspnea Wheezing   Minna Antis, MD 03/27/20 1351

## 2020-03-27 NOTE — ED Triage Notes (Signed)
Pt c/o increased SOB since November, states he has a congested cough. States he gets out of breathe walking across the parking lot. Pt is in NAD on arrival, respirations WNL

## 2020-06-28 ENCOUNTER — Emergency Department: Payer: Self-pay

## 2020-06-28 ENCOUNTER — Encounter: Payer: Self-pay | Admitting: Emergency Medicine

## 2020-06-28 ENCOUNTER — Other Ambulatory Visit: Payer: Self-pay

## 2020-06-28 ENCOUNTER — Emergency Department
Admission: EM | Admit: 2020-06-28 | Discharge: 2020-06-28 | Disposition: A | Discharge status: Home / Self Care | Payer: Self-pay | Attending: Emergency Medicine | Admitting: Emergency Medicine

## 2020-06-28 DIAGNOSIS — F17210 Nicotine dependence, cigarettes, uncomplicated: Secondary | ICD-10-CM | POA: Insufficient documentation

## 2020-06-28 DIAGNOSIS — J441 Chronic obstructive pulmonary disease with (acute) exacerbation: Secondary | ICD-10-CM | POA: Insufficient documentation

## 2020-06-28 HISTORY — DX: Chronic obstructive pulmonary disease, unspecified: J44.9

## 2020-06-28 LAB — CBC WITH DIFFERENTIAL/PLATELET
Abs Immature Granulocytes: 0.02 10*3/uL (ref 0.00–0.07)
Basophils Absolute: 0.1 10*3/uL (ref 0.0–0.1)
Basophils Relative: 1 %
Eosinophils Absolute: 0.4 10*3/uL (ref 0.0–0.5)
Eosinophils Relative: 4 %
HCT: 40.9 % (ref 39.0–52.0)
Hemoglobin: 13.9 g/dL (ref 13.0–17.0)
Immature Granulocytes: 0 %
Lymphocytes Relative: 15 %
Lymphs Abs: 1.3 10*3/uL (ref 0.7–4.0)
MCH: 30.6 pg (ref 26.0–34.0)
MCHC: 34 g/dL (ref 30.0–36.0)
MCV: 90.1 fL (ref 80.0–100.0)
Monocytes Absolute: 0.8 10*3/uL (ref 0.1–1.0)
Monocytes Relative: 9 %
Neutro Abs: 6 10*3/uL (ref 1.7–7.7)
Neutrophils Relative %: 71 %
Platelets: 306 10*3/uL (ref 150–400)
RBC: 4.54 MIL/uL (ref 4.22–5.81)
RDW: 13.2 % (ref 11.5–15.5)
WBC: 8.5 10*3/uL (ref 4.0–10.5)
nRBC: 0 % (ref 0.0–0.2)

## 2020-06-28 LAB — BASIC METABOLIC PANEL
Anion gap: 10 (ref 5–15)
BUN: 7 mg/dL (ref 6–20)
CO2: 26 mmol/L (ref 22–32)
Calcium: 8.8 mg/dL — ABNORMAL LOW (ref 8.9–10.3)
Chloride: 102 mmol/L (ref 98–111)
Creatinine, Ser: 0.98 mg/dL (ref 0.61–1.24)
GFR, Estimated: >60 mL/min (ref >60)
Glucose, Bld: 197 mg/dL — ABNORMAL HIGH (ref 70–99)
Potassium: 3.5 mmol/L (ref 3.5–5.1)
Sodium: 138 mmol/L (ref 135–145)

## 2020-06-28 LAB — TROPONIN I (HIGH SENSITIVITY): Troponin I (High Sensitivity): 4 ng/L (ref <18)

## 2020-06-28 MED ORDER — IPRATROPIUM-ALBUTEROL 0.5-2.5 (3) MG/3ML IN SOLN
3.0000 mL | Freq: Once | RESPIRATORY_TRACT | Status: AC
  Administered 2020-06-28: 3 mL via RESPIRATORY_TRACT
  Filled 2020-06-28: qty 3

## 2020-06-28 MED ORDER — DOXYCYCLINE HYCLATE 100 MG PO TABS: 100.0000 mg | ORAL_TABLET | Freq: Two times a day (BID) | ORAL | 0 refills | Status: AC

## 2020-06-28 MED ORDER — METHYLPREDNISOLONE SODIUM SUCC 125 MG IJ SOLR
125.0000 mg | Freq: Once | INTRAMUSCULAR | Status: AC
  Administered 2020-06-28: 125 mg via INTRAVENOUS
  Filled 2020-06-28: qty 2

## 2020-06-28 MED ORDER — PREDNISONE 50 MG PO TABS: 50.0000 mg | ORAL_TABLET | Freq: Every day | ORAL | 0 refills | Status: AC

## 2020-06-28 MED ORDER — ALBUTEROL SULFATE HFA 108 (90 BASE) MCG/ACT IN AERS: 2.0000 | INHALATION_SPRAY | Freq: Four times a day (QID) | RESPIRATORY_TRACT | 2 refills | Status: DC | PRN

## 2020-06-28 MED ORDER — DOXYCYCLINE HYCLATE 100 MG PO TABS
100.0000 mg | ORAL_TABLET | Freq: Once | ORAL | Status: AC
  Administered 2020-06-28: 100 mg via ORAL
  Filled 2020-06-28: qty 1

## 2020-06-28 NOTE — ED Provider Notes (Signed)
[facility] Emergency Department Provider Note ____________________________________________   Event Date/Time   First MD Initiated Contact with Patient 06/28/20 [id number]     (approximate)  I have reviewed the triage vital signs and the nursing notes.  HISTORY  Chief Complaint Cough and Shortness of Breath   HPI Franklin [name] is a 50 y.o. malewho presents to the ED for evaluation of SOB and cough.   Chart review indicates hx COPD, schizophrenia, polysubstance abuse.   Patient presents to the ED for evaluation of a few days of worsening shortness of breath and cough.  He does report increased sputum production from his baseline, but no fevers.  Further reports of chest tightness sensation that he attributes to his frequent coughing.  Denies syncopal episodes, falls, abdominal pain or emesis.  Reports "enough is enough" of the coughing, particularly at night, so he presents to the ED for evaluation. He continues to smoke, although less.  He has albuterol inhaler but no controller inhalers.  He reports this feels consistent with his previous COPD exacerbations.  Past Medical History:  Diagnosis Date   Anxiety    Bipolar 1 disorder (HCC)    COPD (chronic obstructive pulmonary disease) (HCC)    Depression    No pertinent past medical history    Schizophrenia (HCC)    Substance abuse (HCC)     Patient Active Problem List   Diagnosis Date Noted   Alcohol abuse with alcohol-induced mood disorder (HCC) 02/02/2016   Generalized anxiety disorder    Substance abuse (HCC) 03/22/2015   Tobacco use disorder 10/06/2014   Major depressive disorder, recurrent episode, severe, with psychotic behavior (HCC) 10/05/2014   Cocaine use disorder, severe, dependence (HCC) 10/02/2014   Major depressive disorder, recurrent, severe without psychotic features (HCC)    Alcohol use disorder, severe, dependence (HCC) 06/18/2014   Substance induced mood disorder (HCC) 03/14/2013    Polysubstance abuse (HCC) 04/08/2012    Past Surgical History:  Procedure Laterality Date   NO PAST SURGERIES      Prior to Admission medications   Medication Sig Start Date End Date Taking? Authorizing Provider  albuterol (VENTOLIN HFA) 108 (90 Base) MCG/ACT inhaler Inhale 2 puffs into the lungs every 6 (six) hours as needed for wheezing or shortness of breath. 06/28/20  Yes Delton Prairie, MD  doxycycline (VIBRA-TABS) 100 MG tablet Take 1 tablet (100 mg total) by mouth 2 (two) times daily for 4 days. 06/28/20 07/02/20 Yes Delton Prairie, MD  predniSONE (DELTASONE) 50 MG tablet Take 1 tablet (50 mg total) by mouth daily for 4 days. 06/28/20 07/02/20 Yes Delton Prairie, MD  albuterol (VENTOLIN HFA) 108 (90 Base) MCG/ACT inhaler Inhale 2 puffs into the lungs every 6 (six) hours as needed for wheezing or shortness of breath. 03/27/20   Minna Antis, MD  chlordiazePOXIDE (LIBRIUM) 25 MG capsule 50mg  PO TID x 1D, then 25-50mg  PO BID X 1D, then 25-50mg  PO QD X 1D 10/13/16   12/13/16, MD  gabapentin (NEURONTIN) 100 MG capsule Take 2 capsules (200 mg total) by mouth 3 (three) times daily. 02/04/16   02/06/16, NP  hydrOXYzine (ATARAX/VISTARIL) 25 MG tablet Take 1 tablet (25 mg total) by mouth every 6 (six) hours as needed for anxiety. 02/04/16   02/06/16, NP  Multiple Vitamin (MULTIVITAMIN WITH MINERALS) TABS tablet Take 1 tablet by mouth daily.    [provider]  naproxen (NAPROSYN) 500 MG tablet Take 1 tablet (500 mg total) by mouth 2 (  two) times daily. 10/20/16   Donnetta Hutching, MD  pantoprazole (PROTONIX) 20 MG tablet Take 1 tablet (20 mg total) by mouth daily. 02/05/16   Laveda Abbe, NP  predniSONE (DELTASONE) 10 MG tablet Day 1-3: take 4 tablets PO daily. Day 4-6: take 3 tablets PO daily. Day 7-9: take 2 tablets PO daily. Day 10-12: take 1 tablet PO daily 03/27/20   Minna Antis, MD  QUEtiapine (SEROQUEL) 100 MG tablet Take 1 tablet (100 mg total) by  mouth at bedtime. Patient not taking: Reported on 10/13/2016 02/04/16   Laveda Abbe, NP  sertraline (ZOLOFT) 50 MG tablet Take 1 tablet (50 mg total) by mouth daily. 02/05/16   Laveda Abbe, NP    Allergies Patient has no known allergies.  Family History  Problem Relation Age of Onset   Hypertension Other    Diabetes Other     Social History Social History   Tobacco Use   Smoking status: Every Day    Packs/day: 0.50    Pack years: 0.00    Types: Cigarettes   Smokeless tobacco: Never   Tobacco comments:    Patient refused  Vaping Use   Vaping Use: Never used  Substance Use Topics   Alcohol use: Yes    Alcohol/week: 12.0 standard drinks    Types: 12 Cans of beer per week    Comment: He drinks one 12 pack of beer per day. There is a long history of alcohol use.   Drug use: Yes    Types: "Crack" cocaine, Cocaine    Comment: one gram daily    Review of Systems  Constitutional: No fever/chills Eyes: No visual changes. ENT: No sore throat. Cardiovascular: Denies chest pain. Respiratory: Positive for cough and shortness of breath. Gastrointestinal: No abdominal pain.  No nausea, no vomiting.  No diarrhea.  No constipation. Genitourinary: Negative for dysuria. Musculoskeletal: Negative for back pain. Skin: Negative for rash. Neurological: Negative for headaches, focal weakness or numbness. ____________________________________________  PHYSICAL EXAM:  VITAL SIGNS: Vitals:   06/28/20 0930 06/28/20 0931  BP: 131/82   Pulse: 98   Resp: (!) 21   Temp:  99.2 F (37.3 C)  SpO2: 97%     Constitutional: Alert and oriented. Well appearing and in no acute distress. Eyes: Conjunctivae are normal. PERRL. EOMI. Head: Atraumatic. Nose: No congestion/rhinnorhea. Mouth/Throat: Mucous membranes are moist.  Oropharynx non-erythematous. Neck: No stridor. No cervical spine tenderness to palpation. Cardiovascular: Normal rate, regular rhythm. Grossly normal heart  sounds.  Good peripheral circulation. Respiratory: Minimal tachypnea to the low 20s.  No retractions.  Diffuse expiratory wheezes and slightly decreased air movement throughout. Gastrointestinal: Soft , nondistended, nontender to palpation. No CVA tenderness. Musculoskeletal: No lower extremity tenderness nor edema.  No joint effusions. No signs of acute trauma. Neurologic:  Normal speech and language. No gross focal neurologic deficits are appreciated. No gait instability noted. Skin:  Skin is warm, dry and intact. No rash noted. Psychiatric: Mood and affect are normal. Speech and behavior are normal. ____________________________________________   LABS (all labs ordered are listed, but only abnormal results are displayed)  Labs Reviewed  BASIC METABOLIC PANEL - Abnormal; Notable for the following components:      Result Value   Glucose, Bld 197 (*)    Calcium 8.8 (*)    All other components within normal limits  CBC WITH DIFFERENTIAL/PLATELET  TROPONIN I (HIGH SENSITIVITY)   ____________________________________________  12 Lead EKG  Sinus rhythm, rate of 83 bpm.  Normal  axis and intervals.  No evidence of acute ischemia. ____________________________________________  RADIOLOGY  ED MD interpretation: 1 view CXR reviewed by me without PTX or lobar infiltration  Official radiology report(s): No results found.  ____________________________________________   PROCEDURES and INTERVENTIONS  Procedure(s) performed (including Critical Care):  .1-3 Lead EKG Interpretation  Date/Time: 06/28/2020 10:43 AM Performed by: Delton Prairie, MD Authorized by: Delton Prairie, MD     Interpretation: normal     ECG rate:  92   ECG rate assessment: normal     Rhythm: sinus rhythm     Ectopy: none     Conduction: normal    Medications  ipratropium-albuterol (DUONEB) 0.5-2.5 (3) MG/3ML nebulizer solution 3 mL (3 mLs Nebulization Given 06/28/20 0936)  methylPREDNISolone sodium succinate  (SOLU-MEDROL) 125 mg/2 mL injection 125 mg (125 mg Intravenous Given 06/28/20 0936)  doxycycline (VIBRA-TABS) tablet 100 mg (100 mg Oral Given 06/28/20 0936)    ____________________________________________   MDM / ED COURSE   50 year old smoker presents to the ED with COPD exacerbation and amenable to outpatient management.  Due to his increased sputum production we will deploy antibiotics and start a course of steroids.  Plan to reassess after breathing treatments with anticipation of outpatient management.  No evidence of ACS.  Clinical Course as of 06/28/20 1043  Sat Jun 28, 2020  1028 Reassessed.  Patient reports feeling better.  He looks more comfortable and no longer tachypneic.  Wheezing resolved.  We discussed antibiotics and steroids as an outpatient.  We discussed return precautions. [DS]    Clinical Course User Index [DS] Delton Prairie, MD    ____________________________________________   FINAL CLINICAL IMPRESSION(S) / ED DIAGNOSES  Final diagnoses:  COPD exacerbation [facility])     ED Discharge Orders          Ordered    predniSONE (DELTASONE) 50 MG tablet  Daily        06/28/20 1031    doxycycline (VIBRA-TABS) 100 MG tablet  2 times daily        06/28/20 1031    albuterol (VENTOLIN HFA) 108 (90 Base) MCG/ACT inhaler  Every 6 hours PRN        06/28/20 1042             Mylynn Dinh   Note:  This document was prepared using Conservation officer, historic buildings and may include unintentional dictation errors.    Delton Prairie, MD 06/28/20 [phone]

## 2020-06-28 NOTE — Discharge Instructions (Addendum)
You are being started on a course of doxycycline antibiotic to take twice daily for the next 5 days.  We gave you the first morning pill for today, please pick up prescription and start taking this evening, and twice daily starting tomorrow.  You are also being started on 5 days of prednisone steroids.  We gave the dose already for today, pick this prescription up and start taking tomorrow/Sunday.

## 2020-06-28 NOTE — ED Triage Notes (Signed)
Pt reports has COPD and for the past 2 days he has been more short of breath and coughing non stop. Pt reports some tightness in his chest from coughing.

## 2021-01-21 ENCOUNTER — Emergency Department: Payer: Self-pay

## 2021-01-21 ENCOUNTER — Encounter: Payer: Self-pay | Admitting: Medical Oncology

## 2021-01-21 ENCOUNTER — Emergency Department
Admission: EM | Admit: 2021-01-21 | Discharge: 2021-01-21 | Disposition: A | Discharge status: Home / Self Care | Payer: Self-pay | Attending: Emergency Medicine | Admitting: Emergency Medicine

## 2021-01-21 DIAGNOSIS — Z5321 Procedure and treatment not carried out due to patient leaving prior to being seen by health care provider: Secondary | ICD-10-CM | POA: Diagnosis not present

## 2021-01-21 DIAGNOSIS — R059 Cough, unspecified: Secondary | ICD-10-CM | POA: Diagnosis not present

## 2021-01-21 DIAGNOSIS — R0602 Shortness of breath: Secondary | ICD-10-CM | POA: Diagnosis present

## 2021-01-21 DIAGNOSIS — J449 Chronic obstructive pulmonary disease, unspecified: Secondary | ICD-10-CM | POA: Diagnosis not present

## 2021-01-21 LAB — CBC WITH DIFFERENTIAL/PLATELET
Abs Immature Granulocytes: 0.02 10*3/uL (ref 0.00–0.07)
Basophils Absolute: 0.1 10*3/uL (ref 0.0–0.1)
Basophils Relative: 1 %
Eosinophils Absolute: 0.5 10*3/uL (ref 0.0–0.5)
Eosinophils Relative: 6 %
HCT: 46.8 % (ref 39.0–52.0)
Hemoglobin: 16 g/dL (ref 13.0–17.0)
Immature Granulocytes: 0 %
Lymphocytes Relative: 25 %
Lymphs Abs: 1.9 10*3/uL (ref 0.7–4.0)
MCH: 30.2 pg (ref 26.0–34.0)
MCHC: 34.2 g/dL (ref 30.0–36.0)
MCV: 88.5 fL (ref 80.0–100.0)
Monocytes Absolute: 0.5 10*3/uL (ref 0.1–1.0)
Monocytes Relative: 7 %
Neutro Abs: 4.8 10*3/uL (ref 1.7–7.7)
Neutrophils Relative %: 61 %
Platelets: 321 10*3/uL (ref 150–400)
RBC: 5.29 MIL/uL (ref 4.22–5.81)
RDW: 13.2 % (ref 11.5–15.5)
WBC: 7.8 10*3/uL (ref 4.0–10.5)
nRBC: 0 % (ref 0.0–0.2)

## 2021-01-21 LAB — BASIC METABOLIC PANEL
Anion gap: 3 — ABNORMAL LOW (ref 5–15)
BUN: 12 mg/dL (ref 6–20)
CO2: 23 mmol/L (ref 22–32)
Calcium: 8.3 mg/dL — ABNORMAL LOW (ref 8.9–10.3)
Chloride: 106 mmol/L (ref 98–111)
Creatinine, Ser: 0.73 mg/dL (ref 0.61–1.24)
GFR, Estimated: >60 mL/min (ref >60)
Glucose, Bld: 167 mg/dL — ABNORMAL HIGH (ref 70–99)
Potassium: 3.7 mmol/L (ref 3.5–5.1)
Sodium: 132 mmol/L — ABNORMAL LOW (ref 135–145)

## 2021-01-21 MED ORDER — IPRATROPIUM-ALBUTEROL 0.5-2.5 (3) MG/3ML IN SOLN
RESPIRATORY_TRACT | Status: AC
  Filled 2021-01-21: qty 3

## 2021-01-21 MED ORDER — IPRATROPIUM-ALBUTEROL 0.5-2.5 (3) MG/3ML IN SOLN
3.0000 mL | Freq: Once | RESPIRATORY_TRACT | Status: AC
  Administered 2021-01-21: 3 mL via RESPIRATORY_TRACT

## 2021-01-21 NOTE — ED Notes (Signed)
Pt given breathing treatment by RN Herbert Seta. Pt asking for RN at this time. Pt states he is finished with treatment and is good to go. Pt states he doesn't want to be seen by MD and will be ok until the 1st of the month when he gets his nebulizer. Pt advised to come back if something changes. Pt agrees. Pt ambulatory with steady gait. Respirations even and unlabored.

## 2021-01-21 NOTE — ED Triage Notes (Signed)
Pt reports hx of COPD and for the past 2 days he has been having worsening SOB. Pt reports pain to lung from coughing. Prod cough with white sputum. Denies fever.

## 2021-08-10 ENCOUNTER — Emergency Department: Payer: Self-pay

## 2021-08-10 ENCOUNTER — Emergency Department
Admission: EM | Admit: 2021-08-10 | Discharge: 2021-08-10 | Disposition: A | Discharge status: Home / Self Care | Payer: Self-pay | Attending: Emergency Medicine | Admitting: Emergency Medicine

## 2021-08-10 DIAGNOSIS — J441 Chronic obstructive pulmonary disease with (acute) exacerbation: Secondary | ICD-10-CM | POA: Insufficient documentation

## 2021-08-10 DIAGNOSIS — F1410 Cocaine abuse, uncomplicated: Secondary | ICD-10-CM | POA: Insufficient documentation

## 2021-08-10 LAB — CBC WITH DIFFERENTIAL/PLATELET
Abs Immature Granulocytes: 0.02 10*3/uL (ref 0.00–0.07)
Basophils Absolute: 0.1 10*3/uL (ref 0.0–0.1)
Basophils Relative: 1 %
Eosinophils Absolute: 0.8 10*3/uL — ABNORMAL HIGH (ref 0.0–0.5)
Eosinophils Relative: 10 %
HCT: 44.3 % (ref 39.0–52.0)
Hemoglobin: 14.2 g/dL (ref 13.0–17.0)
Immature Granulocytes: 0 %
Lymphocytes Relative: 25 %
Lymphs Abs: 1.9 10*3/uL (ref 0.7–4.0)
MCH: 30.3 pg (ref 26.0–34.0)
MCHC: 32.1 g/dL (ref 30.0–36.0)
MCV: 94.5 fL (ref 80.0–100.0)
Monocytes Absolute: 0.7 10*3/uL (ref 0.1–1.0)
Monocytes Relative: 8 %
Neutro Abs: 4.3 10*3/uL (ref 1.7–7.7)
Neutrophils Relative %: 56 %
Platelets: 249 10*3/uL (ref 150–400)
RBC: 4.69 MIL/uL (ref 4.22–5.81)
RDW: 15.1 % (ref 11.5–15.5)
WBC: 7.8 10*3/uL (ref 4.0–10.5)
nRBC: 0 % (ref 0.0–0.2)

## 2021-08-10 LAB — COMPREHENSIVE METABOLIC PANEL
ALT: 19 U/L (ref 0–44)
AST: 26 U/L (ref 15–41)
Albumin: 3.7 g/dL (ref 3.5–5.0)
Alkaline Phosphatase: 73 U/L (ref 38–126)
Anion gap: 7 (ref 5–15)
BUN: 14 mg/dL (ref 6–20)
CO2: 27 mmol/L (ref 22–32)
Calcium: 8.8 mg/dL — ABNORMAL LOW (ref 8.9–10.3)
Chloride: 105 mmol/L (ref 98–111)
Creatinine, Ser: 1.02 mg/dL (ref 0.61–1.24)
GFR, Estimated: >60 mL/min (ref >60)
Glucose, Bld: 135 mg/dL — ABNORMAL HIGH (ref 70–99)
Potassium: 3.5 mmol/L (ref 3.5–5.1)
Sodium: 139 mmol/L (ref 135–145)
Total Bilirubin: 1 mg/dL (ref 0.3–1.2)
Total Protein: 6.7 g/dL (ref 6.5–8.1)

## 2021-08-10 LAB — ACETAMINOPHEN LEVEL: Acetaminophen (Tylenol), Serum: <10 ug/mL — ABNORMAL LOW (ref 10–30)

## 2021-08-10 LAB — ETHANOL: Alcohol, Ethyl (B): <10 mg/dL (ref <10)

## 2021-08-10 LAB — BRAIN NATRIURETIC PEPTIDE: B Natriuretic Peptide: 31.2 pg/mL (ref 0.0–100.0)

## 2021-08-10 LAB — SALICYLATE LEVEL: Salicylate Lvl: <7 mg/dL — ABNORMAL LOW (ref 7.0–30.0)

## 2021-08-10 LAB — TROPONIN I (HIGH SENSITIVITY): Troponin I (High Sensitivity): 7 ng/L (ref <18)

## 2021-08-10 MED ORDER — GUAIFENESIN 100 MG/5ML PO LIQD
5.0000 mL | Freq: Once | ORAL | Status: AC
  Administered 2021-08-10: 5 mL via ORAL
  Filled 2021-08-10: qty 10

## 2021-08-10 MED ORDER — METHYLPREDNISOLONE SODIUM SUCC 125 MG IJ SOLR
125.0000 mg | Freq: Once | INTRAMUSCULAR | Status: AC
  Administered 2021-08-10: 125 mg via INTRAVENOUS
  Filled 2021-08-10: qty 2

## 2021-08-10 MED ORDER — ALBUTEROL SULFATE HFA 108 (90 BASE) MCG/ACT IN AERS: 2.0000 | INHALATION_SPRAY | RESPIRATORY_TRACT | 0 refills | Status: DC | PRN

## 2021-08-10 MED ORDER — IPRATROPIUM-ALBUTEROL 0.5-2.5 (3) MG/3ML IN SOLN
3.0000 mL | Freq: Once | RESPIRATORY_TRACT | Status: AC
  Administered 2021-08-10: 3 mL via RESPIRATORY_TRACT
  Filled 2021-08-10: qty 3

## 2021-08-10 MED ORDER — ALBUTEROL SULFATE (2.5 MG/3ML) 0.083% IN NEBU
3.0000 mL | INHALATION_SOLUTION | Freq: Once | RESPIRATORY_TRACT | Status: AC
  Administered 2021-08-10: 3 mL via RESPIRATORY_TRACT
  Filled 2021-08-10: qty 3

## 2021-08-10 MED ORDER — PREDNISONE 20 MG PO TABS: 60.0000 mg | ORAL_TABLET | Freq: Every day | ORAL | 0 refills | Status: DC

## 2021-08-10 NOTE — ED Notes (Signed)
Waiting on inhaler from pharmacy. Pt resting, no acute distress noted.

## 2021-08-10 NOTE — ED Triage Notes (Signed)
Per EMS pt called for SOB and told ems he took 1gm of cocaine. Pt is twitching heavily and biting at air. Pt is answering questions appropriately MD Ward at bedside.

## 2021-08-10 NOTE — ED Notes (Signed)
Pt sleeping, resps unlabored.  

## 2021-08-10 NOTE — ED Notes (Signed)
Ambulatory POX of 95%. Meal tray  and po fluids provided.

## 2021-08-10 NOTE — ED Notes (Signed)
Pt sitting up in stretcher with blanket over face.  Equal chest rise and fall noted.  Pt on cardiac monitor and call bell within reach at this time.

## 2021-08-10 NOTE — Discharge Instructions (Addendum)
You may use your albuterol inhaler 2 to 4 puffs every 2-4 hours as needed for shortness of breath and wheezing.   Steps to find a Primary Care Provider (PCP):  Call [phone] or [phone] to access "[place] Find a Doctor Service."  2.  You may also go on the [facility] website at [email or web address]

## 2021-08-10 NOTE — ED Notes (Signed)
Report to courtney, rn.  

## 2021-08-10 NOTE — ED Provider Notes (Signed)
[facility] Provider Note    Event Date/Time   First MD Initiated Contact with Patient 08/10/21 0144     (approximate)   History   Drug Overdose and Shortness of Breath   HPI  Franklin [name] is a 51 y.o. male with history of COPD, substance abuse, schizophrenia, bipolar disorder who presents to the emergency department with EMS with complaints of shortness of breath.  EMS reports he was wheezing in route and they gave him 1 breathing treatment.  They state wheezing improved.  No Solu-Medrol given.  Patient reports he did smoke a gram of cocaine about 5 hours ago.  He denies SI.  Denies any other ingestions.  He appears intoxicated here.  No chest pain.  No fever.   History provided by patient and EMS.    Past Medical History:  Diagnosis Date   Anxiety    Bipolar 1 disorder ([place])    COPD (chronic obstructive pulmonary disease) (HCC)    Depression    No pertinent past medical history    Schizophrenia ([place])    Substance abuse ([place])     Past Surgical History:  Procedure Laterality Date   NO PAST SURGERIES      MEDICATIONS:  Prior to Admission medications   Medication Sig Start Date End Date Taking? Authorizing Provider  albuterol (VENTOLIN HFA) 108 (90 Base) MCG/ACT inhaler Inhale 2 puffs into the lungs every 6 (six) hours as needed for wheezing or shortness of breath. 03/27/20   Harvest Dark, MD  albuterol (VENTOLIN HFA) 108 (90 Base) MCG/ACT inhaler Inhale 2 puffs into the lungs every 6 (six) hours as needed for wheezing or shortness of breath. 06/28/20   Vladimir Crofts, MD  chlordiazePOXIDE (LIBRIUM) 25 MG capsule 50mg  PO TID x 1D, then 25-50mg  PO BID X 1D, then 25-50mg  PO QD X 1D 10/13/16   Quintella Reichert, MD  gabapentin (NEURONTIN) 100 MG capsule Take 2 capsules (200 mg total) by mouth 3 (three) times daily. 02/04/16   Ethelene Hal, NP  hydrOXYzine (ATARAX/VISTARIL) 25 MG tablet Take 1 tablet (25 mg total) by mouth every 6 (six) hours  as needed for anxiety. 02/04/16   Ethelene Hal, NP  Multiple Vitamin (MULTIVITAMIN WITH MINERALS) TABS tablet Take 1 tablet by mouth daily.    [provider]  naproxen (NAPROSYN) 500 MG tablet Take 1 tablet (500 mg total) by mouth 2 (two) times daily. 10/20/16   Nat Christen, MD  pantoprazole (PROTONIX) 20 MG tablet Take 1 tablet (20 mg total) by mouth daily. 02/05/16   Ethelene Hal, NP  predniSONE (DELTASONE) 10 MG tablet Day 1-3: take 4 tablets PO daily. Day 4-6: take 3 tablets PO daily. Day 7-9: take 2 tablets PO daily. Day 10-12: take 1 tablet PO daily 03/27/20   Harvest Dark, MD  QUEtiapine (SEROQUEL) 100 MG tablet Take 1 tablet (100 mg total) by mouth at bedtime. Patient not taking: Reported on 10/13/2016 02/04/16   Ethelene Hal, NP  sertraline (ZOLOFT) 50 MG tablet Take 1 tablet (50 mg total) by mouth daily. 02/05/16   Ethelene Hal, NP    Physical Exam   Triage Vital Signs: ED Triage Vitals [08/10/21 0140]  Enc Vitals Group     BP 137/79     Pulse Rate 78     Resp (!) 29     Temp 98.2 F (36.8 C)     Temp src      SpO2 96 %  Weight      Height      Head Circumference      Peak Flow      Pain Score      Pain Loc      Pain Edu?      Excl. in GC?     Most recent vital signs: Vitals:   08/10/21 0500 08/10/21 0530  BP: 138/82 117/78  Pulse:    Resp:    Temp:    SpO2:      CONSTITUTIONAL: Alert and oriented x3 but does appear intoxicated. HEAD: Normocephalic, atraumatic EYES: Conjunctivae clear, pupils appear equal, sclera nonicteric ENT: normal nose; moist mucous membranes NECK: Supple, normal ROM CARD: RRR; S1 and S2 appreciated; no murmurs, no clicks, no rubs, no gallops RESP: Patient is slightly tachypneic.  Diffuse inspiratory and expiratory wheezes heard bilaterally without rhonchi or rales.  No respiratory distress.  No hypoxia. ABD/GI: Normal bowel sounds; non-distended; soft, non-tender, no rebound, no guarding,  no peritoneal signs BACK: The back appears normal EXT: Normal ROM in all joints; no deformity noted, no edema; no cyanosis, no calf tenderness or calf swelling SKIN: Normal color for age and race; warm; no rash on exposed skin NEURO: Moves all extremities equally, normal speech PSYCH: The patient's mood and manner are appropriate.   ED Results / Procedures / Treatments   LABS: (all labs ordered are listed, but only abnormal results are displayed) Labs Reviewed  COMPREHENSIVE METABOLIC PANEL - Abnormal; Notable for the following components:      Result Value   Glucose, Bld 135 (*)    Calcium 8.8 (*)    All other components within normal limits  CBC WITH DIFFERENTIAL/PLATELET - Abnormal; Notable for the following components:   Eosinophils Absolute 0.8 (*)    All other components within normal limits  ACETAMINOPHEN LEVEL - Abnormal; Notable for the following components:   Acetaminophen (Tylenol), Serum <10 (*)    All other components within normal limits  SALICYLATE LEVEL - Abnormal; Notable for the following components:   Salicylate Lvl <7.0 (*)    All other components within normal limits  BRAIN NATRIURETIC PEPTIDE  ETHANOL  URINE DRUG SCREEN, QUALITATIVE ([facility] ONLY)  TROPONIN I (HIGH SENSITIVITY)     EKG:  EKG Interpretation  Date/Time:  Monday August 10 2021 01:42:23 EDT Ventricular Rate:  78 PR Interval:  142 QRS Duration: 100 QT Interval:  374 QTC Calculation: 426 R Axis:   69 Text Interpretation: Sinus rhythm Atrial premature complex Confirmed by Rochele Raring [phone]) on 08/10/2021 1:56:40 AM         RADIOLOGY: My personal review and interpretation of imaging: Chest x-ray clear.  I have personally reviewed all radiology reports.   DG Chest Portable 1 View  Result Date: 08/10/2021 CLINICAL DATA:  Shortness of breath EXAM: PORTABLE CHEST 1 VIEW COMPARISON:  None Available. FINDINGS: The heart size and mediastinal contours are within normal limits. Both lungs  are clear. The visualized skeletal structures are unremarkable. IMPRESSION: No active disease. Electronically Signed   By: Deatra Robinson M.D.   On: 08/10/2021 02:34     PROCEDURES:  Critical Care performed: No     .1-3 Lead EKG Interpretation  Performed by: Day Deery, Layla Maw, DO Authorized by: Roxine Whittinghill, Layla Maw, DO     Interpretation: normal     ECG rate:  79   ECG rate assessment: normal     Rhythm: sinus rhythm     Ectopy: none     Conduction: normal  IMPRESSION / MDM / ASSESSMENT AND PLAN / ED COURSE  I reviewed the triage vital signs and the nursing notes.    Patient here with wheezing, shortness of breath.  He called 911 to monitor.  Reports he smoked a gram of cocaine about 5 hours prior to arrival.  Denies any other coingestions.  The patient is on the cardiac monitor to evaluate for evidence of arrhythmia and/or significant heart rate changes.   DIFFERENTIAL DIAGNOSIS (includes but not limited to):   COPD exacerbation, viral URI, CHF, ACS, pneumothorax, PE   Patient's presentation is most consistent with acute presentation with potential threat to life or bodily function.   PLAN: Obtain CBC, BMP, troponin, BNP, chest x-ray, EKG.  Will give breathing treatments, Solu-Medrol.  He is intoxicated but able to answer questions and denies any pain.   MEDICATIONS GIVEN IN ED: Medications  albuterol (VENTOLIN HFA) 108 (90 Base) MCG/ACT inhaler 2 puff (has no administration in time range)  ipratropium-albuterol (DUONEB) 0.5-2.5 (3) MG/3ML nebulizer solution 3 mL (3 mLs Nebulization Given 08/10/21 0150)  methylPREDNISolone sodium succinate (SOLU-MEDROL) 125 mg/2 mL injection 125 mg (125 mg Intravenous Given 08/10/21 0150)  ipratropium-albuterol (DUONEB) 0.5-2.5 (3) MG/3ML nebulizer solution 3 mL (3 mLs Nebulization Given 08/10/21 0358)  guaiFENesin (ROBITUSSIN) 100 MG/5ML liquid 5 mL (5 mLs Oral Given 08/10/21 0358)     ED COURSE: Patient's labs show no leukocytosis, normal  hemoglobin.  Normal electrolytes, renal function and LFTs.  Alcohol, Tylenol and salicylate levels negative.  Troponin negative.  BNP normal.  Chest x-ray reviewed and interpreted by myself and the radiologist and shows no acute abnormality.  EKG is nonischemic.  Patient still wheezing and coughing.  We will give another breathing treatment and cough suppressant.   6:05 AM Patient awake, conversant without any complaints.  His lungs are now clear to auscultation he has no hypoxia with rest or ambulation.  Tolerating p.o.  No psychiatric concerns today.  States he has someone to pick him up at 8 AM.  Will be discharged with a sober driver.  Will be discharged with albuterol inhaler and prednisone burst.   At this time, I do not feel there is any life-threatening condition present. I reviewed all nursing notes, vitals, pertinent previous records.  All lab and urine results, EKGs, imaging ordered have been independently reviewed and interpreted by myself.  I reviewed all available radiology reports from any imaging ordered this visit.  Based on my assessment, I feel the patient is safe to be discharged home without further emergent workup and can continue workup as an outpatient as needed. Discussed all findings, treatment plan as well as usual and customary return precautions.  They verbalize understanding and are comfortable with this plan.  Outpatient follow-up has been provided as needed.  All questions have been answered.   CONSULTS: Patient considered for COPD exacerbation but lungs now clear and patient has no hypoxia at rest or with ambulation and no respiratory distress.   OUTSIDE RECORDS REVIEWED: Reviewed patient's last psychiatric admission in January 2018.       FINAL CLINICAL IMPRESSION(S) / ED DIAGNOSES   Final diagnoses:  Cocaine abuse (HCC)  COPD exacerbation ([place])     Rx / DC Orders   ED Discharge Orders          Ordered    predniSONE (DELTASONE) 20 MG tablet  Daily         08/10/21 0609    albuterol (VENTOLIN HFA) 108 (90 Base) MCG/ACT inhaler  Every 4 hours PRN        08/10/21 0609             Note:  This document was prepared using Dragon voice recognition software and may include unintentional dictation errors.   Kasy Iannacone, Layla Maw, DO 08/10/21 [phone]

## 2021-08-10 NOTE — ED Notes (Signed)
Pt up to restroom without assistance 

## 2021-08-24 ENCOUNTER — Emergency Department
Admission: EM | Admit: 2021-08-24 | Discharge: 2021-08-24 | Disposition: A | Discharge status: Home / Self Care | Payer: Self-pay | Attending: Emergency Medicine | Admitting: Emergency Medicine

## 2021-08-24 ENCOUNTER — Emergency Department: Payer: Self-pay

## 2021-08-24 DIAGNOSIS — Y900 Blood alcohol level of less than 20 mg/100 ml: Secondary | ICD-10-CM | POA: Insufficient documentation

## 2021-08-24 DIAGNOSIS — R0602 Shortness of breath: Secondary | ICD-10-CM | POA: Insufficient documentation

## 2021-08-24 DIAGNOSIS — J449 Chronic obstructive pulmonary disease, unspecified: Secondary | ICD-10-CM | POA: Insufficient documentation

## 2021-08-24 DIAGNOSIS — R0600 Dyspnea, unspecified: Secondary | ICD-10-CM

## 2021-08-24 LAB — TROPONIN I (HIGH SENSITIVITY)
Troponin I (High Sensitivity): 4 ng/L (ref <18)
Troponin I (High Sensitivity): 4 ng/L (ref <18)

## 2021-08-24 LAB — BLOOD GAS, VENOUS
Acid-Base Excess: 9.1 mmol/L — ABNORMAL HIGH (ref 0.0–2.0)
Bicarbonate: 35.2 mmol/L — ABNORMAL HIGH (ref 20.0–28.0)
O2 Saturation: 77.5 %
Patient temperature: 37
pCO2, Ven: 53 mmHg (ref 44–60)
pH, Ven: 7.43 (ref 7.25–7.43)
pO2, Ven: 45 mmHg (ref 32–45)

## 2021-08-24 LAB — COMPREHENSIVE METABOLIC PANEL
ALT: 17 U/L (ref 0–44)
AST: 26 U/L (ref 15–41)
Albumin: 4 g/dL (ref 3.5–5.0)
Alkaline Phosphatase: 70 U/L (ref 38–126)
Anion gap: 8 (ref 5–15)
BUN: 16 mg/dL (ref 6–20)
CO2: 30 mmol/L (ref 22–32)
Calcium: 9.4 mg/dL (ref 8.9–10.3)
Chloride: 104 mmol/L (ref 98–111)
Creatinine, Ser: 0.92 mg/dL (ref 0.61–1.24)
GFR, Estimated: >60 mL/min (ref >60)
Glucose, Bld: 146 mg/dL — ABNORMAL HIGH (ref 70–99)
Potassium: 3.4 mmol/L — ABNORMAL LOW (ref 3.5–5.1)
Sodium: 142 mmol/L (ref 135–145)
Total Bilirubin: 0.7 mg/dL (ref 0.3–1.2)
Total Protein: 7 g/dL (ref 6.5–8.1)

## 2021-08-24 LAB — CBC WITH DIFFERENTIAL/PLATELET
Abs Immature Granulocytes: 0.02 10*3/uL (ref 0.00–0.07)
Basophils Absolute: 0.1 10*3/uL (ref 0.0–0.1)
Basophils Relative: 1 %
Eosinophils Absolute: 0.5 10*3/uL (ref 0.0–0.5)
Eosinophils Relative: 8 %
HCT: 43.4 % (ref 39.0–52.0)
Hemoglobin: 14.4 g/dL (ref 13.0–17.0)
Immature Granulocytes: 0 %
Lymphocytes Relative: 23 %
Lymphs Abs: 1.4 10*3/uL (ref 0.7–4.0)
MCH: 30.4 pg (ref 26.0–34.0)
MCHC: 33.2 g/dL (ref 30.0–36.0)
MCV: 91.6 fL (ref 80.0–100.0)
Monocytes Absolute: 0.5 10*3/uL (ref 0.1–1.0)
Monocytes Relative: 7 %
Neutro Abs: 3.7 10*3/uL (ref 1.7–7.7)
Neutrophils Relative %: 61 %
Platelets: 307 10*3/uL (ref 150–400)
RBC: 4.74 MIL/uL (ref 4.22–5.81)
RDW: 14.6 % (ref 11.5–15.5)
WBC: 6.1 10*3/uL (ref 4.0–10.5)
nRBC: 0 % (ref 0.0–0.2)

## 2021-08-24 LAB — SALICYLATE LEVEL: Salicylate Lvl: <7 mg/dL — ABNORMAL LOW (ref 7.0–30.0)

## 2021-08-24 LAB — ACETAMINOPHEN LEVEL: Acetaminophen (Tylenol), Serum: <10 ug/mL — ABNORMAL LOW (ref 10–30)

## 2021-08-24 LAB — ETHANOL: Alcohol, Ethyl (B): <10 mg/dL (ref <10)

## 2021-08-24 MED ORDER — IPRATROPIUM-ALBUTEROL 0.5-2.5 (3) MG/3ML IN SOLN
3.0000 mL | RESPIRATORY_TRACT | Status: AC
  Administered 2021-08-24 (×2): 3 mL via RESPIRATORY_TRACT
  Filled 2021-08-24: qty 3

## 2021-08-24 NOTE — ED Provider Notes (Signed)
[facility] Provider Note    Event Date/Time   First MD Initiated Contact with Patient 08/24/21 1150     (approximate)   History   Shortness of Breath (Dispatch called for SOB, FIRE reported that patient was wheezing so patient was given Albuterol 5 mg neb; Patient's only complaint upon arrival here is a cough; Patient falls asleep quickly during triage but arouses with verbal stimuli, maintains patent airway; Denies any substance use today)   HPI  Franklin [name] is a 51 y.o. male   Past medical history of COPD, substance use, MDD, schizophrenia and substance-induced mood disorders presents with shortness of breath was reportedly wheezing per EMS and was given albuterol nebulizer with dramatic improvement.  Patient also complaining of a cough for the last several days.  He is intermittently cooperative with examination and interview and is behaving bizarrely, also falls asleep quickly or turns away but is easily arousable to verbal stimulation maintaining airway.  He states that he has some mild shortness of breath, but mostly would like to rest.  He states that he used cocaine yesterday, smokes cocaine regularly, and used alcohol last night.  Denies substance use this morning.  He has no other complaints at this time.  History was obtained via patient and review of external notes      Physical Exam   Triage Vital Signs: ED Triage Vitals  Enc Vitals Group     BP 08/24/21 1147 (!) 107/96     Pulse Rate 08/24/21 1147 87     Resp 08/24/21 1147 17     Temp 08/24/21 1147 98.4 F (36.9 C)     Temp Source 08/24/21 1147 Oral     SpO2 --      Weight 08/24/21 1149 148 lb 9.4 oz (67.4 kg)     Height 08/24/21 1149 5\' 10"  (1.778 m)     Head Circumference --      Peak Flow --      Pain Score 08/24/21 1148 0     Pain Loc --      Pain Edu? --      Excl. in GC? --     Most recent vital signs: Vitals:   08/24/21 1530 08/24/21 1601  BP: 104/70 113/73  Pulse:   71  Resp: (!) 26 20  Temp:  98.5 F (36.9 C)  SpO2:  97%    General: Awake, no distress.  Closes eyes during our conversation, but is redirectable, though needs redirection multiple times to maintain our conversation. CV:  Good peripheral perfusion.  Resp:  Normal effort.  Scant wheezing bilateral apices, no rales or rhonchi, no respiratory distress. Abd:  No distention.  Soft.  Nontender. Other:  Pulls equal round and reactive, small but reactive.  Moving all extremities.  Sensation intact.  No signs of head trauma.   ED Results / Procedures / Treatments   Labs (all labs ordered are listed, but only abnormal results are displayed) Labs Reviewed  ACETAMINOPHEN LEVEL - Abnormal; Notable for the following components:      Result Value   Acetaminophen (Tylenol), Serum <10 (*)    All other components within normal limits  COMPREHENSIVE METABOLIC PANEL - Abnormal; Notable for the following components:   Potassium 3.4 (*)    Glucose, Bld 146 (*)    All other components within normal limits  SALICYLATE LEVEL - Abnormal; Notable for the following components:   Salicylate Lvl <7.0 (*)    All other components within  normal limits  BLOOD GAS, VENOUS - Abnormal; Notable for the following components:   Bicarbonate 35.2 (*)    Acid-Base Excess 9.1 (*)    All other components within normal limits  ETHANOL  CBC WITH DIFFERENTIAL/PLATELET  TROPONIN I (HIGH SENSITIVITY)  TROPONIN I (HIGH SENSITIVITY)     I reviewed labs and they are notable for borderline low potassium, 3.4, flat trops.   EKG  ED ECG REPORT I, Pilar Jarvis, the attending physician, personally viewed and interpreted this ECG.   Date: 08/24/2021  EKG Time: 1149  Rate: 92  Rhythm: normal EKG, normal sinus rhythm, unchanged from previous tracings, PAC's noted  Axis: normal  Intervals:none  ST&T Change: no ischemic changes    RADIOLOGY I dependently reviewed and interpreted cxr -- no overt opacities or  pneumothorax   PROCEDURES:  Critical Care performed: No  Procedures   MEDICATIONS ORDERED IN ED: Medications  ipratropium-albuterol (DUONEB) 0.5-2.5 (3) MG/3ML nebulizer solution 3 mL (3 mLs Nebulization Given 08/24/21 1226)     IMPRESSION / MDM / ASSESSMENT AND PLAN / ED COURSE  I reviewed the triage vital signs and the nursing notes.                              Differential diagnosis includes, but is not limited to PD exacerbation, respiratory infection, substance use, considered though less likely ACS.    MDM: Wheezing noted in this patient with COPD and no respiratory distress, will continue with DuoNebs treatment for symptomatic improvement.  Check troponins and EKG for ACS given risk factors, age, history of cocaine use.  Patient is comfortable at this time and appears intoxicated, will check toxicology labs, basic labs, hold for sobriety and reassessment.  Dispo: After careful consideration of this patient's presentation, medical and social risk factors, and evaluation in the emergency department I engaged in shared decision making with the patient and/or their representative to consider admission or observation and this patient was ultimately discharged because symptoms completely resolved and his work-up was unremarkable as above.  I discussed social risk factors including lack of PCP, and referred the patient to a clinic.  Was given return precautions and will go home to his mother's house today..   Patient's presentation is most consistent with acute presentation with potential threat to life or bodily function.       FINAL CLINICAL IMPRESSION(S) / ED DIAGNOSES   Final diagnoses:  Dyspnea, unspecified type     Rx / DC Orders   ED Discharge Orders     None        Note:  This document was prepared using Dragon voice recognition software and may include unintentional dictation errors.    Pilar Jarvis, MD 08/24/21 [phone]

## 2021-08-24 NOTE — ED Triage Notes (Signed)
Dispatch called for SOB, FIRE reported that patient was wheezing so patient was given Albuterol 5 mg neb; Patient's only complaint upon arrival here is a cough; Patient falls asleep quickly during triage but arouses with verbal stimuli, maintains patent airway; Denies any substance use today

## 2021-08-24 NOTE — Discharge Instructions (Addendum)
[facility] at the number provided to establish care with a primary doctor in for checkup later this week.  You have any new or worsening symptoms, call the clinic or come back to the emergency department right away for reevaluation.

## 2022-02-27 IMAGING — CR DG CHEST 2V
1 series · 2 of 2 positions shown · non-contrast
Comparison: None.

CLINICAL DATA: Shortness of breath

EXAM:
CHEST - 2 VIEW

[Series 1: dg chest 2 view · 0.14mm/px · 2 of 2 slices shown]
[im 1/2]
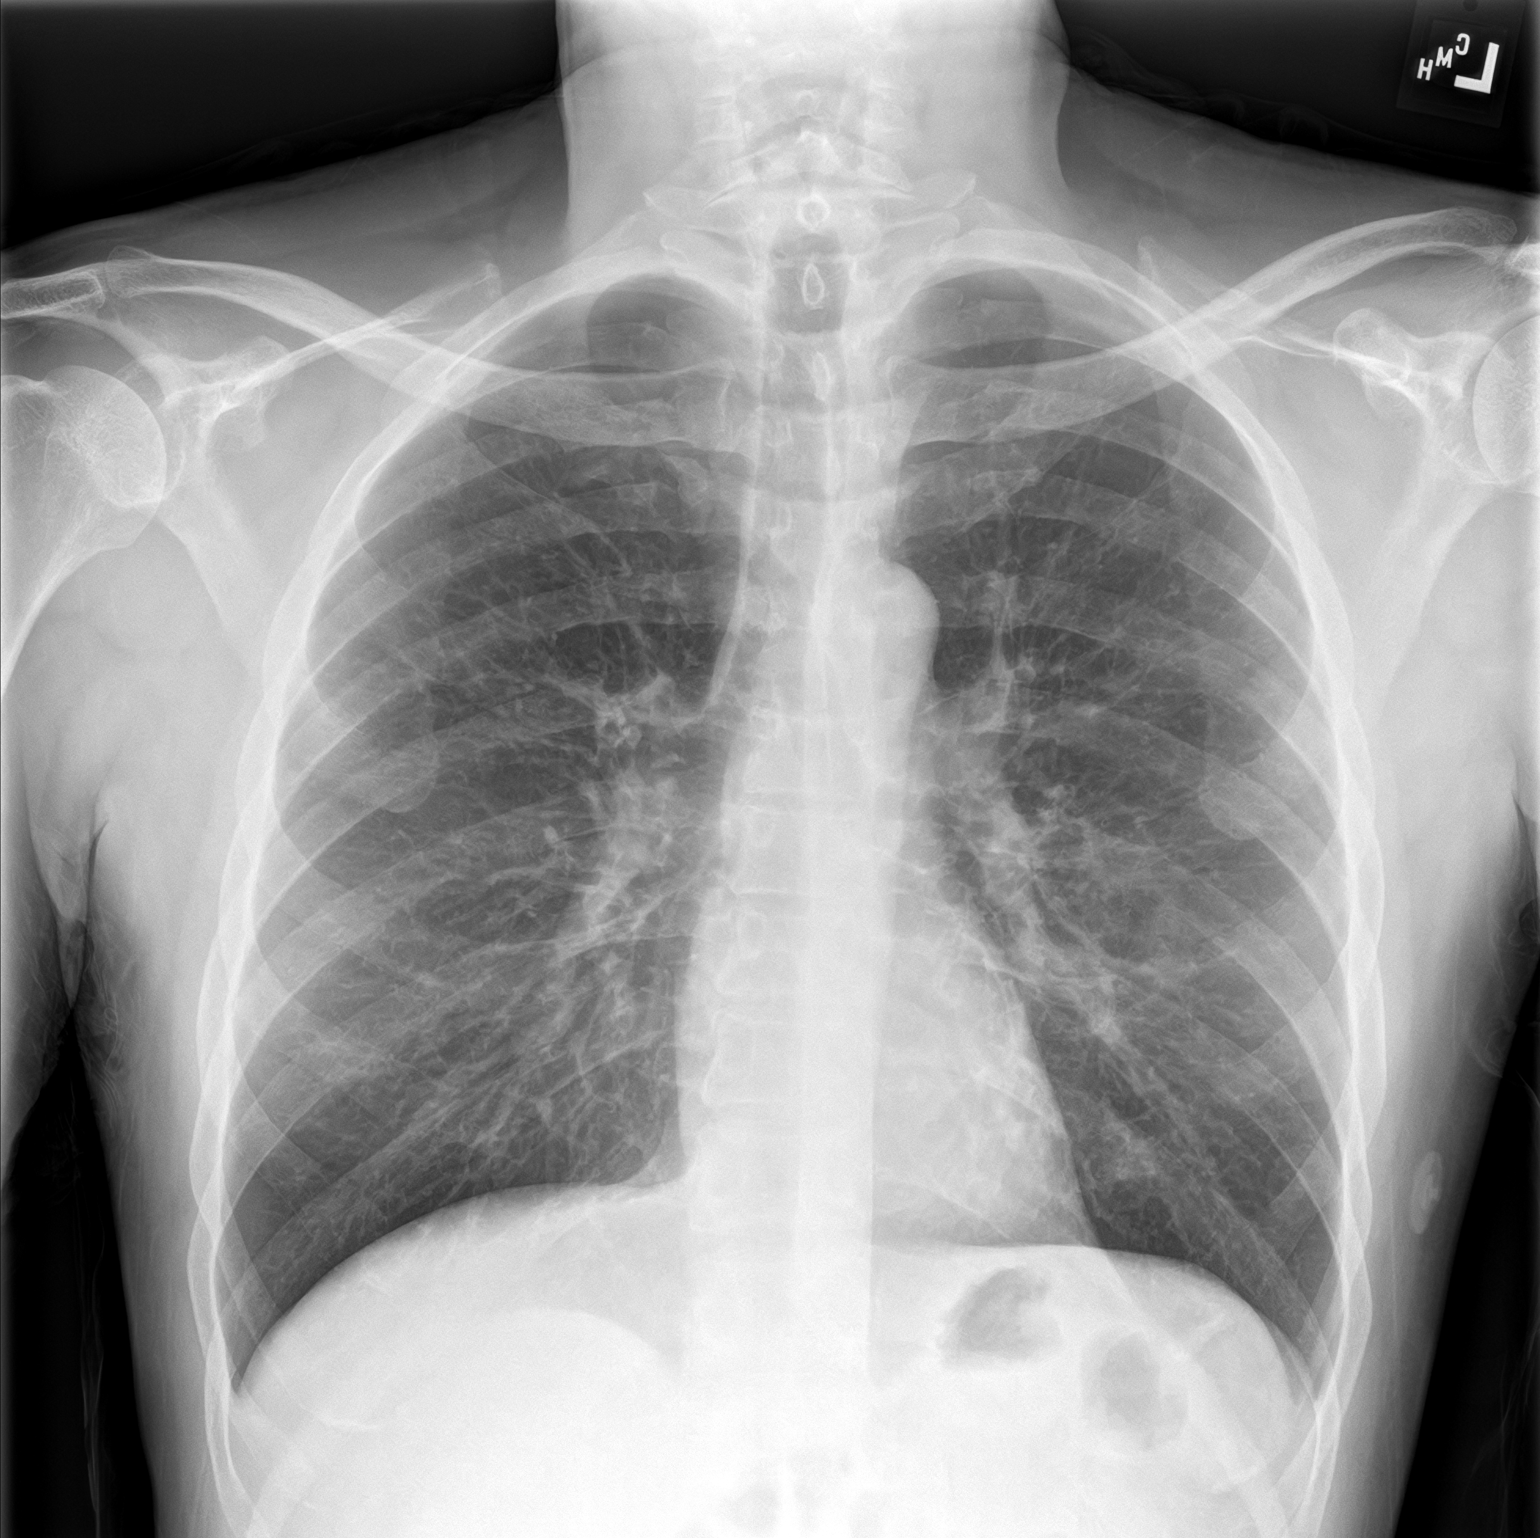
[im 2/2]
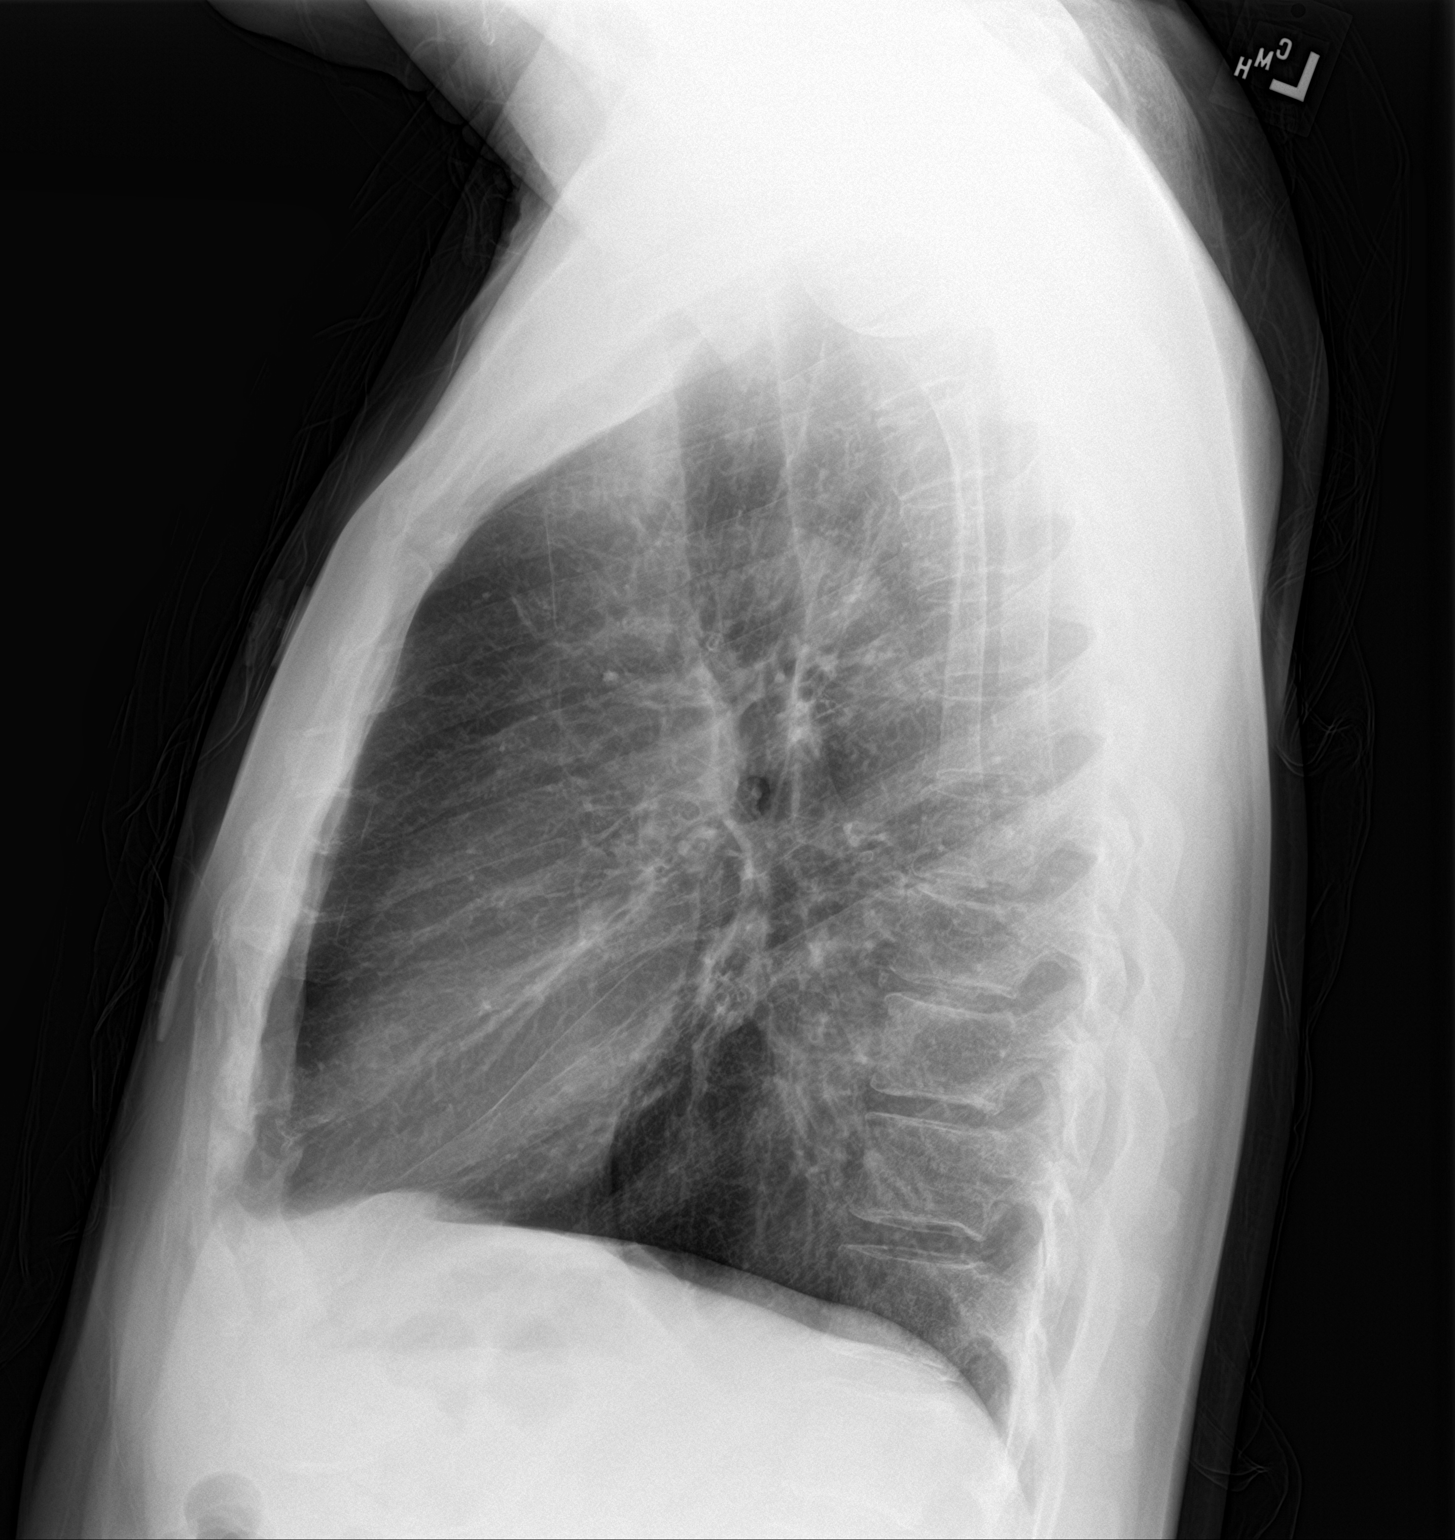

[2 of 2 positions shown; findings below may reference images not displayed]

FINDINGS: The heart size and mediastinal contours are within normal limits.
Mild interstitial changes probably related to smoking history. No
consolidation or edema. No pleural effusion. The visualized skeletal
structures are unremarkable.
IMPRESSION: No acute process in the chest.

## 2022-04-04 ENCOUNTER — Emergency Department (HOSPITAL_COMMUNITY)
Admission: EM | Admit: 2022-04-04 | Discharge: 2022-04-04 | Disposition: A | Discharge status: Home / Self Care | Payer: Medicaid Other | Attending: Emergency Medicine | Admitting: Emergency Medicine

## 2022-04-04 ENCOUNTER — Encounter (HOSPITAL_COMMUNITY): Payer: Self-pay

## 2022-04-04 DIAGNOSIS — R7401 Elevation of levels of liver transaminase levels: Secondary | ICD-10-CM | POA: Diagnosis not present

## 2022-04-04 DIAGNOSIS — Z7951 Long term (current) use of inhaled steroids: Secondary | ICD-10-CM | POA: Diagnosis not present

## 2022-04-04 DIAGNOSIS — Z21 Asymptomatic human immunodeficiency virus [HIV] infection status: Secondary | ICD-10-CM | POA: Diagnosis not present

## 2022-04-04 DIAGNOSIS — F209 Schizophrenia, unspecified: Secondary | ICD-10-CM | POA: Diagnosis not present

## 2022-04-04 DIAGNOSIS — R440 Auditory hallucinations: Secondary | ICD-10-CM | POA: Insufficient documentation

## 2022-04-04 DIAGNOSIS — F1420 Cocaine dependence, uncomplicated: Secondary | ICD-10-CM | POA: Diagnosis present

## 2022-04-04 DIAGNOSIS — J449 Chronic obstructive pulmonary disease, unspecified: Secondary | ICD-10-CM | POA: Diagnosis not present

## 2022-04-04 DIAGNOSIS — F1910 Other psychoactive substance abuse, uncomplicated: Secondary | ICD-10-CM

## 2022-04-04 LAB — CBC WITH DIFFERENTIAL/PLATELET
Abs Immature Granulocytes: 0.02 10*3/uL (ref 0.00–0.07)
Basophils Absolute: 0.1 10*3/uL (ref 0.0–0.1)
Basophils Relative: 1 %
Eosinophils Absolute: 0.3 10*3/uL (ref 0.0–0.5)
Eosinophils Relative: 5 %
HCT: 41.5 % (ref 39.0–52.0)
Hemoglobin: 13.6 g/dL (ref 13.0–17.0)
Immature Granulocytes: 0 %
Lymphocytes Relative: 23 %
Lymphs Abs: 1.5 10*3/uL (ref 0.7–4.0)
MCH: 29.9 pg (ref 26.0–34.0)
MCHC: 32.8 g/dL (ref 30.0–36.0)
MCV: 91.2 fL (ref 80.0–100.0)
Monocytes Absolute: 0.4 10*3/uL (ref 0.1–1.0)
Monocytes Relative: 6 %
Neutro Abs: 4.3 10*3/uL (ref 1.7–7.7)
Neutrophils Relative %: 65 %
Platelets: 368 10*3/uL (ref 150–400)
RBC: 4.55 MIL/uL (ref 4.22–5.81)
RDW: 15.9 % — ABNORMAL HIGH (ref 11.5–15.5)
WBC: 6.7 10*3/uL (ref 4.0–10.5)
nRBC: 0 % (ref 0.0–0.2)

## 2022-04-04 LAB — COMPREHENSIVE METABOLIC PANEL
ALT: 24 U/L (ref 0–44)
AST: 34 U/L (ref 15–41)
Albumin: 4.3 g/dL (ref 3.5–5.0)
Alkaline Phosphatase: 80 U/L (ref 38–126)
Anion gap: 8 (ref 5–15)
BUN: 20 mg/dL (ref 6–20)
CO2: 29 mmol/L (ref 22–32)
Calcium: 9.3 mg/dL (ref 8.9–10.3)
Chloride: 101 mmol/L (ref 98–111)
Creatinine, Ser: 0.89 mg/dL (ref 0.61–1.24)
GFR, Estimated: >60 mL/min (ref >60)
Glucose, Bld: 125 mg/dL — ABNORMAL HIGH (ref 70–99)
Potassium: 3.9 mmol/L (ref 3.5–5.1)
Sodium: 138 mmol/L (ref 135–145)
Total Bilirubin: 1.2 mg/dL (ref 0.3–1.2)
Total Protein: 7.7 g/dL (ref 6.5–8.1)

## 2022-04-04 LAB — RAPID URINE DRUG SCREEN, HOSP PERFORMED
Amphetamines: NOT DETECTED
Barbiturates: NOT DETECTED
Benzodiazepines: NOT DETECTED
Cocaine: POSITIVE — AB
Opiates: NOT DETECTED
Tetrahydrocannabinol: NOT DETECTED

## 2022-04-04 LAB — SALICYLATE LEVEL: Salicylate Lvl: <7 mg/dL — ABNORMAL LOW (ref 7.0–30.0)

## 2022-04-04 LAB — ACETAMINOPHEN LEVEL: Acetaminophen (Tylenol), Serum: <10 ug/mL — ABNORMAL LOW (ref 10–30)

## 2022-04-04 LAB — ETHANOL: Alcohol, Ethyl (B): <10 mg/dL (ref <10)

## 2022-04-04 LAB — CBG MONITORING, ED: Glucose-Capillary: 166 mg/dL — ABNORMAL HIGH (ref 70–99)

## 2022-04-04 LAB — POC OCCULT BLOOD, ED: Fecal Occult Bld: NEGATIVE

## 2022-04-04 MED ORDER — LORAZEPAM 1 MG PO TABS: 0.0000 mg | ORAL_TABLET | Freq: Four times a day (QID) | ORAL | Status: DC

## 2022-04-04 MED ORDER — LORAZEPAM 2 MG/ML IJ SOLN: 0.0000 mg | Freq: Two times a day (BID) | INTRAMUSCULAR | Status: DC

## 2022-04-04 MED ORDER — LORAZEPAM 1 MG PO TABS: 0.0000 mg | ORAL_TABLET | Freq: Two times a day (BID) | ORAL | Status: DC

## 2022-04-04 MED ORDER — THIAMINE HCL 100 MG/ML IJ SOLN: 100.0000 mg | Freq: Every day | INTRAMUSCULAR | Status: DC

## 2022-04-04 MED ORDER — THIAMINE MONONITRATE 100 MG PO TABS
100.0000 mg | ORAL_TABLET | Freq: Every day | ORAL | Status: DC
  Administered 2022-04-04: 100 mg via ORAL
  Filled 2022-04-04: qty 1

## 2022-04-04 MED ORDER — LORAZEPAM 2 MG/ML IJ SOLN: 0.0000 mg | Freq: Four times a day (QID) | INTRAMUSCULAR | Status: DC

## 2022-04-04 NOTE — ED Provider Notes (Addendum)
[facility] Provider Note   CSN: [id number] Arrival date & time: 04/04/22  0732     History  Chief Complaint  Patient presents with   Alcohol Problem    Franklin [name] is a 52 y.o. male history includes schizophrenia, polysubstance abuse, bipolar, COPD, anxiety/depression.  Patient presents to the ER today for several concerns.  His primary concern was requesting to detox from alcohol.  He reports that he drinks around 12 beers a day and his last drink was around 3 hours ago.  He would like to get clean from this.  He also requested detox from crack and his last use was 4 hours ago.  He has been using for several years and thinks it is time to get clean.  He does report occasional auditory hallucinations, he is not unclear what they are saying but reports that he believes this is due to his schizophrenia acting up again.  Patient denies any suicidal or homicidal ideations.  Additionally patient is requesting an HIV test he reports he was exposed to HIV recently, he denies any history of HIV.  Finally patient request that we test his stool for blood he reports he has noticed some black stools occasionally.  Patient denies fever, chills, abdominal pain, nausea, vomiting, chest pain/shortness of breath, suicidal/homicidal ideations or any additional concerns.  HPI     Home Medications Prior to Admission medications   Medication Sig Start Date End Date Taking? Authorizing Provider  albuterol (VENTOLIN HFA) 108 (90 Base) MCG/ACT inhaler Inhale 2 puffs into the lungs every 4 (four) hours as needed for wheezing or shortness of breath. 08/10/21   Ward, Delice Bison, DO  chlordiazePOXIDE (LIBRIUM) 25 MG capsule 50mg  PO TID x 1D, then 25-50mg  PO BID X 1D, then 25-50mg  PO QD X 1D 10/13/16   Quintella Reichert, MD  gabapentin (NEURONTIN) 100 MG capsule Take 2 capsules (200 mg total) by mouth 3 (three) times daily. 02/04/16   Ethelene Hal, NP   hydrOXYzine (ATARAX/VISTARIL) 25 MG tablet Take 1 tablet (25 mg total) by mouth every 6 (six) hours as needed for anxiety. 02/04/16   Ethelene Hal, NP  Multiple Vitamin (MULTIVITAMIN WITH MINERALS) TABS tablet Take 1 tablet by mouth daily.    [provider]  naproxen (NAPROSYN) 500 MG tablet Take 1 tablet (500 mg total) by mouth 2 (two) times daily. 10/20/16   Nat Christen, MD  pantoprazole (PROTONIX) 20 MG tablet Take 1 tablet (20 mg total) by mouth daily. 02/05/16   Ethelene Hal, NP  predniSONE (DELTASONE) 20 MG tablet Take 3 tablets (60 mg total) by mouth daily. 08/10/21   Ward, Delice Bison, DO  QUEtiapine (SEROQUEL) 100 MG tablet Take 1 tablet (100 mg total) by mouth at bedtime. Patient not taking: Reported on 10/13/2016 02/04/16   Ethelene Hal, NP  sertraline (ZOLOFT) 50 MG tablet Take 1 tablet (50 mg total) by mouth daily. 02/05/16   Ethelene Hal, NP      Allergies    Patient has no known allergies.    Review of Systems   Review of Systems Ten systems are reviewed and are negative for acute change except as noted in the HPI  Physical Exam Updated Vital Signs BP (!) 152/82   Pulse 90   Temp 98.2 F (36.8 C) (Oral)   Resp 16   Ht 5\' 10"  (1.778 m)   Wt 68 kg   SpO2 98%   BMI 21.52 kg/m  Physical Exam Constitutional:      General: He is not in acute distress.    Appearance: Normal appearance. He is well-developed. He is not ill-appearing or diaphoretic.  HENT:     Head: Normocephalic and atraumatic.  Eyes:     General: Vision grossly intact. Gaze aligned appropriately.     Pupils: Pupils are equal, round, and reactive to light.  Neck:     Trachea: Trachea and phonation normal.  Pulmonary:     Effort: Pulmonary effort is normal. No respiratory distress.  Abdominal:     General: There is no distension.     Palpations: Abdomen is soft.     Tenderness: There is no abdominal tenderness. There is no guarding or rebound.  Musculoskeletal:         General: Normal range of motion.     Cervical back: Normal range of motion.  Skin:    General: Skin is warm and dry.  Neurological:     Mental Status: He is alert.     GCS: GCS eye subscore is 4. GCS verbal subscore is 5. GCS motor subscore is 6.     Comments: Speech is clear and goal oriented, follows commands Major Cranial nerves without deficit, no facial droop Moves extremities without ataxia, coordination intact  Psychiatric:        Attention and Perception: Attention normal. He perceives auditory hallucinations.        Mood and Affect: Mood normal.        Speech: Speech normal.        Behavior: Behavior normal. Behavior is cooperative.        Thought Content: Thought content does not include homicidal or suicidal ideation.     ED Results / Procedures / Treatments   Labs (all labs ordered are listed, but only abnormal results are displayed) Labs Reviewed  COMPREHENSIVE METABOLIC PANEL - Abnormal; Notable for the following components:      Result Value   Glucose, Bld 125 (*)    All other components within normal limits  RAPID URINE DRUG SCREEN, HOSP PERFORMED - Abnormal; Notable for the following components:   Cocaine POSITIVE (*)    All other components within normal limits  CBC WITH DIFFERENTIAL/PLATELET - Abnormal; Notable for the following components:   RDW 15.9 (*)    All other components within normal limits  SALICYLATE LEVEL - Abnormal; Notable for the following components:   Salicylate Lvl [id number] (*)    All other components within normal limits  ACETAMINOPHEN LEVEL - Abnormal; Notable for the following components:   Acetaminophen (Tylenol), Serum <10 (*)    All other components within normal limits  CBG MONITORING, ED - Abnormal; Notable for the following components:   Glucose-Capillary 166 (*)    All other components within normal limits  ETHANOL  RAPID HIV SCREEN (HIV 1/2 AB+AG)  POC OCCULT BLOOD, ED    EKG EKG Interpretation  Date/Time:  Sunday  April 04 2022 08:37:37 EDT Ventricular Rate:  88 PR Interval:  135 QRS Duration: 113 QT Interval:  401 QTC Calculation: 436 R Axis:   74 Text Interpretation: Sinus rhythm Atrial premature complexes in couplets Biatrial enlargement Borderline intraventricular conduction delay Confirmed by Regan Lemming ([id number]) on 04/04/2022 9:44:50 AM  Radiology No results found.  Procedures Procedures    Medications Ordered in ED Medications  LORazepam (ATIVAN) injection 0-4 mg (has no administration in time range)    Or  LORazepam (ATIVAN) tablet 0-4 mg (has no administration in time range)  LORazepam (ATIVAN) injection 0-4 mg (has no administration in time range)    Or  LORazepam (ATIVAN) tablet 0-4 mg (has no administration in time range)  thiamine (VITAMIN B1) tablet 100 mg (has no administration in time range)    Or  thiamine (VITAMIN B1) injection 100 mg (has no administration in time range)    ED Course/ Medical Decision Making/ A&P Clinical Course as of 04/04/22 1316  Sun Apr 04, 2022  1152 IVC paperwork filled. RN aware [BM]    Clinical Course User Index [BM] Gari Crown                             Medical Decision Making 52 year old male history as above presented for multiple concerns.  His primary request was detox from alcohol and crack which he used a few hours prior to arrival.  Additionally he does report history of schizophrenia and reports that he has been starting to hear things recently but he denies any suicidal or homicidal ideations.  Patient also reports some dark stools recently and requests this to be tested.  Finally he is requesting an HIV test.  On exam patient is well-appearing and in no acute distress, no complaints of pain at this time.  He is attentive and cooperative.  Will obtain medical clearance labs along with HIV test.  Patient brought stool sample we can obtain Hemoccult.  Amount and/or Complexity of Data Reviewed Labs: ordered.     Details: CBC without leukocytosis to suggest acute infectious process.  No anemia or thrombocytopenia. Stool sample was provided by patient and a cup.  This was tested and is Hemoccult negative. UDS positive for cocaine Tylenol and salicylate levels negative Ethanol negative, patient does not appear in withdrawal CMP shows no emergent electrolyte derangement, AKI, LFT elevations or gap CBG 166 HIV test pending ECG/medicine tests:     Details: EKG reviewed and does not show any obvious acute ischemic changes on my interpretation.  Reviewed with Dr. Armandina Gemma.  Risk OTC drugs. Prescription drug management. Risk Details: Patient's labs overall reassuring thus far.  HIV test is pending.  Case was discussed with attending physician Dr. Armandina Gemma who recommends IVC patient now for increased auditory hallucinations in the setting of schizophrenia and polysubstance abuse.  IVC paperwork was completed.  TTS was consulted, CIWA protocol in place.  Med rec pending.     At this time there does not appear to be any evidence of an acute emergency medical condition and the patient appears stable for psychiatric evaluation.  Patient's case discussed with Dr. Armandina Gemma. ==================================================== Addendum 1:35 PM: Patient evaluated by psychiatry, Ricky Ala, NP.  We received message the patient is cleared and does not meet inpatient criteria.  As before patient is medically cleared and encouraged to follow-up with his PCP.  Case discussed with Dr. Armandina Gemma who is discontinuing the IVC paperwork, it had not been faxed yet. Dr. Armandina Gemma agrees with discharge at this time.  Note: Portions of this report may have been transcribed using voice recognition software. Every effort was made to ensure accuracy; however, inadvertent computerized transcription errors may still be present.   Final Clinical Impression(s) / ED Diagnoses Final diagnoses:  Schizophrenia, unspecified type ([place])   Auditory hallucinations  Polysubstance abuse [facility])    Rx / DC Orders ED Discharge Orders     None         Deliah Boston, PA-C 04/04/22 1317  Gari Crown 04/04/22 1337    Regan Lemming, MD 04/04/22 [phone]

## 2022-04-04 NOTE — ED Triage Notes (Addendum)
Pt states he needs detox today, last drink 3 hours ago, drinks 12 beers/day. Pt states this has occurred for several years.  Pt states also wants detox from crack, last use 4 hours ago. Pt states this has been going on for several years.  Pt is concerned for STDs- including HIV.  Pt states that he has had black stools as well.

## 2022-04-04 NOTE — Consult Note (Cosign Needed Addendum)
[facility] ED ASSESSMENT   Reason for Consult:  Detox/Substance Use Referring Physician:  Lennox Laity Patient Identification: Franklin [name] MRN:  [id number] ED Chief Complaint: Cocaine use disorder, severe, dependence  Diagnosis:  Principal Problem:   Cocaine use disorder, severe, dependence [facility])   ED Assessment Time Calculation: Start Time: 1257 Stop Time: 1315 Total Time in Minutes (Assessment Completion): 18   Subjective:   Franklin [name] is a 52 y.o. male presented to [facility] emergency department requesting resources for detox from alcohol and cocaine.  He reports he last used earlier this morning.  He denied suicidal or homicidal ideations.  Does report auditory hallucinations however, he reports voice are chronic in nature and " intensified" with my cocaine use".  Denied that voices are command in nature.  States he feels the voices are worsening with substance abuse.  States he was followed by the [facility] where he was prescribed Seroquel which he felt helped his symptoms. UDS positive for cocaine.   He has a charted history with major depressive disorder with psychotic features, substance-induced mood disorder, generalized anxiety disorder, schizophrenia and major depressive disorder.  He denies that he is currently followed by therapy or psychiatry.  States he has not taken medications in quite some time.  He reports plans to follow-up in [place] on April 15 th to attend a [place] residential treatment.  He provided verbal authorization to follow-up with his sister for additional collateral.  NP spoke to Freddi Starr at [phone] regarding any safety concerns with patient discharging.  He reports previous inpatient admissions event many years ago due to suicide attempt however adamantly denies that he is suicidal at this time.  Patient was offered admission to the [facility]) however declined. Stated " I don't think I am ready to stop using,  I will waite until I get to [place]."   During evaluation Franklin [name] is siting in no acute distress. he is alert/oriented x 4; calm/cooperative; and mood congruent with affect. he is speaking in a clear tone at moderate volume, and normal pace; with good eye contact.  His thought process is coherent and relevant; There is no indication that he is currently responding to internal/external stimuli or experiencing delusional thought content; and he has denied suicidal/self-harm/homicidal ideation, psychosis, and paranoia.  Mild involuntary movements noted throughout the assessment . patient has remained calm throughout assessment and has answered questions appropriately.      Franklin [name] is educated and verbalizes understanding of mental health resources and other crisis services in the community. He is instructed to call 911 and present to the nearest emergency room should he experience any suicidal/homicidal ideation, auditory/visual/hallucinations, or detrimental worsening of her mental health condition. he was a also advised by Probation officer that he could call the toll-free phone on insurance card to assist with identifying in network counselors and agencies or number on back of Medicaid card t speak with care coordinator  HPI:  Per admission assessment note: " Patient presents to the ER today for several concerns. His primary concern was requesting to detox from alcohol. He reports that he drinks around 12 beers a day and his last drink was around 3 hours ago. He would like to get clean from this. He also requested detox from crack and his last use was 4 hours ago. He has been using for several years and thinks it is time to get clean. He does report occasional auditory hallucinations, he is not unclear what they are  saying but reports that he believes this is due to his schizophrenia acting up again."  Past Psychiatric History:   Risk to Self or Others: Is the patient at risk to self? No Has the  patient been a risk to self in the past 6 months? No Has the patient been a risk to self within the distant past? No Is the patient a risk to others? No Has the patient been a risk to others in the past 6 months? Yes Has the patient been a risk to others within the distant past? No  [place] Scale:  [place] [facility] ED from [facility]04/2022 in [facility] at [facility] ED from 08/24/2021 in [facility] Emergency Department at [facility] ED from 08/10/2021 in [facility] Emergency Department at [place] No Risk No Risk No Risk       AIMS:  , , ,  ,   ASAM:    Substance Abuse:     Past Medical History:  Past Medical History:  Diagnosis Date   Anxiety    Bipolar 1 disorder ([place])    COPD (chronic obstructive pulmonary disease) ([place])    Depression    No pertinent past medical history    Schizophrenia ([place])    Substance abuse ([place])     Past Surgical History:  Procedure Laterality Date   NO PAST SURGERIES     Family History:  Family History  Problem Relation Age of Onset   Hypertension Other    Diabetes Other    Family Psychiatric  History:  Social History:  Social History   Substance and Sexual Activity  Alcohol Use Yes   Alcohol/week: 12.0 standard drinks of alcohol   Types: 12 Cans of beer per week   Comment: He drinks one 12 pack of beer per day. There is a long history of alcohol use.     Social History   Substance and Sexual Activity  Drug Use Yes   Types: "Crack" cocaine, Cocaine   Comment: one gram daily    Social History   Socioeconomic History   Marital status: Single    Spouse name: Not on file   Number of children: Not on file   Years of education: Not on file   Highest education level: Not on file  Occupational History   Occupation: [employer]  Tobacco Use   Smoking status: Every Day    Packs/day: .5    Types: Cigarettes   Smokeless tobacco: Never   Tobacco comments:    Patient refused   Vaping Use   Vaping Use: Never used  Substance and Sexual Activity   Alcohol use: Yes    Alcohol/week: 12.0 standard drinks of alcohol    Types: 12 Cans of beer per week    Comment: He drinks one 12 pack of beer per day. There is a long history of alcohol use.   Drug use: Yes    Types: "Crack" cocaine, Cocaine    Comment: one gram daily   Sexual activity: Yes  Other Topics Concern   Not on file  Social History Narrative   The patient was born and raised in with that by both his biological parents. His father's past way but his mother still living. He denies any history of any physical or sexual abuse. He says he completed 2 years of college at [employer]. 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 He has never been married but has a 33 year old son who lives with his mother. He says he does get to see his son and has a relationship with him. He is not currently dating or in a relationship.      The patient does have a history of a DUI and had a court date last Friday for a DUI. He denies any other pending charges.            Social Determinants of Health   Financial Resource Strain: Not on file  Food Insecurity: Not on file  Transportation Needs: Not on file  Physical Activity: Not on file  Stress: Not on file  Social Connections: Not on file   Additional Social History:    Allergies:  No Known Allergies  Labs:  Results for orders placed or performed during the hospital encounter of 04/04/22 (from the past 48 hour(s))  CBG monitoring, ED     Status: Abnormal   Collection Time: 04/04/22  8:43 AM  Result Value Ref Range   Glucose-Capillary 166 (H) 70 - 99 mg/dL    Comment: Glucose reference range applies only to samples taken after fasting for at least 8 hours.  Comprehensive metabolic panel     Status: Abnormal   Collection Time: 04/04/22  9:03 AM  Result Value Ref Range   Sodium 138 135 - 145  mmol/L   Potassium 3.9 3.5 - 5.1 mmol/L   Chloride 101 98 - 111 mmol/L   CO2 29 22 - 32 mmol/L   Glucose, Bld 125 (H) 70 - 99 mg/dL    Comment: Glucose reference range applies only to samples taken after fasting for at least 8 hours.   BUN 20 6 - 20 mg/dL   Creatinine, Ser 0.89 0.61 - 1.24 mg/dL   Calcium 9.3 8.9 - 10.3 mg/dL   Total Protein 7.7 6.5 - 8.1 g/dL   Albumin 4.3 3.5 - 5.0 g/dL   AST 34 15 - 41 U/L   ALT 24 0 - 44 U/L   Alkaline Phosphatase 80 38 - 126 U/L   Total Bilirubin 1.2 0.3 - 1.2 mg/dL   GFR, Estimated >60 >60 mL/min    Comment: (NOTE) Calculated using the CKD-EPI Creatinine Equation (2021)    Anion gap 8 5 - 15    Comment: Performed at [facility], [place] [address], [place], [place] [address]  Ethanol     Status: None   Collection Time: 04/04/22  9:03 AM  Result Value Ref Range   Alcohol, Ethyl (B) <10 <10 mg/dL    Comment: (NOTE) Lowest detectable limit for serum alcohol is 10 mg/dL.  For medical purposes only. Performed at [facility], [place] [address], [place], [place] [address]   Urine rapid drug screen (hosp performed)     Status: Abnormal   Collection Time: 04/04/22  9:03 AM  Result Value Ref Range   Opiates NONE DETECTED NONE DETECTED   Cocaine POSITIVE (A) NONE DETECTED   Benzodiazepines NONE DETECTED NONE DETECTED   Amphetamines NONE DETECTED NONE DETECTED   Tetrahydrocannabinol NONE DETECTED NONE DETECTED   Barbiturates NONE DETECTED NONE DETECTED    Comment: (NOTE) DRUG SCREEN FOR MEDICAL PURPOSES ONLY.  IF CONFIRMATION IS NEEDED FOR ANY PURPOSE, NOTIFY LAB WITHIN 5 DAYS.  LOWEST DETECTABLE LIMITS FOR URINE DRUG SCREEN Drug Class                     Cutoff (ng/mL) Amphetamine  and metabolites    1000 Barbiturate and metabolites    200 Benzodiazepine                 200 Opiates and metabolites        300 Cocaine and metabolites        300 THC                            50 Performed at [facility], [place] [address] [place], [place] [address]   CBC with Diff     Status: Abnormal   Collection Time: 04/04/22  9:03 AM  Result Value Ref Range   WBC 6.7 4.0 - 10.5 K/uL   RBC 4.55 4.22 - 5.81 MIL/uL   Hemoglobin 13.6 13.0 - 17.0 g/dL   HCT 41.5 39.0 - 52.0 %   MCV 91.2 80.0 - 100.0 fL   MCH 29.9 26.0 - 34.0 pg   MCHC 32.8 30.0 - 36.0 g/dL   RDW 15.9 (H) 11.5 - 15.5 %   Platelets 368 150 - 400 K/uL   nRBC 0.0 0.0 - 0.2 %   Neutrophils Relative % 65 %   Neutro Abs 4.3 1.7 - 7.7 K/uL   Lymphocytes Relative 23 %   Lymphs Abs 1.5 0.7 - 4.0 K/uL   Monocytes Relative 6 %   Monocytes Absolute 0.4 0.1 - 1.0 K/uL   Eosinophils Relative 5 %   Eosinophils Absolute 0.3 0.0 - 0.5 K/uL   Basophils Relative 1 %   Basophils Absolute 0.1 0.0 - 0.1 K/uL   Immature Granulocytes 0 %   Abs Immature Granulocytes 0.02 0.00 - 0.07 K/uL    Comment: Performed at [facility], [place] [address] [place], [place] [id number]  Salicylate level     Status: Abnormal   Collection Time: 04/04/22  9:03 AM  Result Value Ref Range   Salicylate Lvl Q000111Q (L) 7.0 - 30.0 mg/dL    Comment: Performed at [facility], [place] [address] [place], [place] [address]  Acetaminophen level     Status: Abnormal   Collection Time: 04/04/22  9:03 AM  Result Value Ref Range   Acetaminophen (Tylenol), Serum <10 (L) 10 - 30 ug/mL    Comment: (NOTE) Therapeutic concentrations vary significantly. A range of 10-30 ug/mL  may be an effective concentration for many patients. However, some  are best treated at concentrations outside of this range. Acetaminophen concentrations >150 ug/mL at 4 hours after ingestion  and >50 ug/mL at 12 hours after ingestion are often associated with  toxic reactions.  Performed at [facility], [place] [address], [place], [place] [address]   POC occult blood, ED RN will collect     Status: None   Collection Time: 04/04/22 10:36 AM   Result Value Ref Range   Fecal Occult Bld NEGATIVE NEGATIVE    Current Facility-Administered Medications  Medication Dose Route Frequency Provider Last Rate Last Admin   LORazepam (ATIVAN) injection 0-4 mg  0-4 mg Intravenous Q6H Morelli, Brandon A, PA-C       Or   LORazepam (ATIVAN) tablet 0-4 mg  0-4 mg Oral Q6H Morelli, Brandon A, PA-C       [START ON 04/06/2022] LORazepam (ATIVAN) injection 0-4 mg  0-4 mg Intravenous Q12H Morelli, Brandon A, PA-C       Or   [START ON 04/06/2022] LORazepam (ATIVAN) tablet 0-4 mg  0-4 mg Oral  Q12H Nuala Alpha A, PA-C       thiamine (VITAMIN B1) tablet 100 mg  100 mg Oral Daily Nuala Alpha A, PA-C       Or   thiamine (VITAMIN B1) injection 100 mg  100 mg Intravenous Daily Nuala Alpha A, PA-C       Current Outpatient Medications  Medication Sig Dispense Refill   albuterol (VENTOLIN HFA) 108 (90 Base) MCG/ACT inhaler Inhale 2 puffs into the lungs every 4 (four) hours as needed for wheezing or shortness of breath. 1 each 0   chlordiazePOXIDE (LIBRIUM) 25 MG capsule 50mg  PO TID x 1D, then 25-50mg  PO BID X 1D, then 25-50mg  PO QD X 1D 10 capsule 0   gabapentin (NEURONTIN) 100 MG capsule Take 2 capsules (200 mg total) by mouth 3 (three) times daily. 126 capsule 0   hydrOXYzine (ATARAX/VISTARIL) 25 MG tablet Take 1 tablet (25 mg total) by mouth every 6 (six) hours as needed for anxiety. 84 tablet 0   Multiple Vitamin (MULTIVITAMIN WITH MINERALS) TABS tablet Take 1 tablet by mouth daily.     naproxen (NAPROSYN) 500 MG tablet Take 1 tablet (500 mg total) by mouth 2 (two) times daily. 20 tablet 0   pantoprazole (PROTONIX) 20 MG tablet Take 1 tablet (20 mg total) by mouth daily. 21 tablet 0   predniSONE (DELTASONE) 20 MG tablet Take 3 tablets (60 mg total) by mouth daily. 15 tablet 0   QUEtiapine (SEROQUEL) 100 MG tablet Take 1 tablet (100 mg total) by mouth at bedtime. (Patient not taking: Reported on 10/13/2016) 21 tablet 0   sertraline (ZOLOFT) 50 MG  tablet Take 1 tablet (50 mg total) by mouth daily. 21 tablet 0    Musculoskeletal: Strength & Muscle Tone: within normal limits Gait & Station: normal Patient leans: N/A   Psychiatric Specialty Exam: Presentation  General Appearance: Appropriate for Environment  Eye Contact:Good  Speech:Clear and Coherent  Speech Volume:Normal  Handedness:Right   Mood and Affect  Mood:Anxious  Affect:Congruent   Thought Process  Thought Processes:Coherent  Descriptions of Associations:Intact  Orientation:Full (Time, Place and Person)  Thought Content:Logical  History of Schizophrenia/Schizoaffective disorder:No data recorded Duration of Psychotic Symptoms:No data recorded Hallucinations:Hallucinations: None  Ideas of Reference:None  Suicidal Thoughts:Suicidal Thoughts: No  Homicidal Thoughts:Homicidal Thoughts: No   Sensorium  Memory:Remote Good; Recent Good  Judgment:Fair  Insight:Fair   Executive Functions  Concentration:Good  Attention Span:Fair  [place] of Knowledge:Good  Language:Good   Psychomotor Activity  Psychomotor Activity:Psychomotor Activity: Normal   Assets  Assets:Social Support    Sleep  Sleep:Sleep: Fair   Physical Exam: Physical Exam Vitals and nursing note reviewed.  Psychiatric:        Mood and Affect: Mood normal.        Behavior: Behavior normal.        Thought Content: Thought content normal.    Review of Systems  Psychiatric/Behavioral:  Positive for hallucinations and substance abuse. The patient is nervous/anxious.   All other systems reviewed and are negative.  Blood pressure (!) 152/82, pulse 90, temperature 98.2 F (36.8 C), temperature source Oral, resp. rate 16, height 5\' 10"  (1.778 m), weight 68 kg, SpO2 98 %. Body mass index is 21.52 kg/m.  Medical Decision Making: Substance-induced mood disorder Alcohol abuse/use -Of note patient was offered inpatient admission to  [facility]  however, he declined at this time  Disposition: No evidence of imminent risk to self or others at present.   Patient does not  meet criteria for psychiatric inpatient admission. Supportive therapy provided about ongoing stressors. Refer to IOP. Discussed crisis plan, support from social network, calling 911, coming to the Emergency Department, and calling Suicide Hotline.  Derrill [place], NP 04/04/2022 1:21 PM

## 2022-04-04 NOTE — Discharge Instructions (Addendum)
At this time there does not appear to be the presence of an emergent medical condition, however there is always the potential for conditions to change. Please read and follow the below instructions.  Please return to the Emergency Department immediately for any new or worsening symptoms. Please be sure to follow up with your Primary Care Provider within one week regarding your visit today; please call their office to schedule an appointment even if you are feeling better for a follow-up visit. Your HIV test is still pending.  It should result in the next 1-2 days.  Please check your MyChart account for those results.  If test are positive please seek further treatment at the health department or by your primary care provider. Your stool test today did not show any evidence for blood.  Please follow-up with your primary care provider this week for recheck.   Please read the additional information packets attached to your discharge summary.  Go to the nearest Emergency Department immediately if: You have fever or chills You feel out of control. You or others notice warning signs of suicide, such as: Using drugs or alcohol more often. Expressing feelings of not having a purpose in life or feeling trapped, guilty, anxious, agitated, or hopeless. Withdrawing from friends and family. Showing uncontrolled anger, recklessness, and dramatic mood changes. Talking about suicide or searching for methods of suicide.      You have any new/concerning or worsening of symptoms.  Do not take your medicine if  develop an itchy rash, swelling in your mouth or lips, or difficulty breathing; call 911 and seek immediate emergency medical attention if this occurs.  You may review your lab tests and imaging results in their entirety on your MyChart account.  Please discuss all results of fully with your primary care provider and other specialist at your follow-up visit.  Note: Portions of this text may have been  transcribed using voice recognition software. Every effort was made to ensure accuracy; however, inadvertent computerized transcription errors may still be present.

## 2022-04-22 ENCOUNTER — Emergency Department: Payer: Medicaid Other

## 2022-04-22 ENCOUNTER — Other Ambulatory Visit: Payer: Self-pay

## 2022-04-22 ENCOUNTER — Emergency Department
Admission: EM | Admit: 2022-04-22 | Discharge: 2022-04-22 | Disposition: A | Discharge status: Home / Self Care | Payer: Medicaid Other | Attending: Emergency Medicine | Admitting: Emergency Medicine

## 2022-04-22 ENCOUNTER — Encounter: Payer: Self-pay | Admitting: Intensive Care

## 2022-04-22 DIAGNOSIS — J441 Chronic obstructive pulmonary disease with (acute) exacerbation: Secondary | ICD-10-CM | POA: Diagnosis not present

## 2022-04-22 DIAGNOSIS — R0602 Shortness of breath: Secondary | ICD-10-CM | POA: Diagnosis present

## 2022-04-22 LAB — CBC WITH DIFFERENTIAL/PLATELET
Abs Immature Granulocytes: 0.02 10*3/uL (ref 0.00–0.07)
Basophils Absolute: 0.1 10*3/uL (ref 0.0–0.1)
Basophils Relative: 1 %
Eosinophils Absolute: 0.9 10*3/uL — ABNORMAL HIGH (ref 0.0–0.5)
Eosinophils Relative: 9 %
HCT: 42.9 % (ref 39.0–52.0)
Hemoglobin: 13.9 g/dL (ref 13.0–17.0)
Immature Granulocytes: 0 %
Lymphocytes Relative: 16 %
Lymphs Abs: 1.4 10*3/uL (ref 0.7–4.0)
MCH: 30.1 pg (ref 26.0–34.0)
MCHC: 32.4 g/dL (ref 30.0–36.0)
MCV: 92.9 fL (ref 80.0–100.0)
Monocytes Absolute: 0.6 10*3/uL (ref 0.1–1.0)
Monocytes Relative: 6 %
Neutro Abs: 6.1 10*3/uL (ref 1.7–7.7)
Neutrophils Relative %: 68 %
Platelets: 337 10*3/uL (ref 150–400)
RBC: 4.62 MIL/uL (ref 4.22–5.81)
RDW: 15.4 % (ref 11.5–15.5)
WBC: 9 10*3/uL (ref 4.0–10.5)
nRBC: 0 % (ref 0.0–0.2)

## 2022-04-22 LAB — COMPREHENSIVE METABOLIC PANEL
ALT: 14 U/L (ref 0–44)
AST: 23 U/L (ref 15–41)
Albumin: 3.9 g/dL (ref 3.5–5.0)
Alkaline Phosphatase: 91 U/L (ref 38–126)
Anion gap: 6 (ref 5–15)
BUN: 17 mg/dL (ref 6–20)
CO2: 27 mmol/L (ref 22–32)
Calcium: 9 mg/dL (ref 8.9–10.3)
Chloride: 108 mmol/L (ref 98–111)
Creatinine, Ser: 0.85 mg/dL (ref 0.61–1.24)
GFR, Estimated: >60 mL/min (ref >60)
Glucose, Bld: 137 mg/dL — ABNORMAL HIGH (ref 70–99)
Potassium: 4.1 mmol/L (ref 3.5–5.1)
Sodium: 141 mmol/L (ref 135–145)
Total Bilirubin: 0.6 mg/dL (ref 0.3–1.2)
Total Protein: 6.9 g/dL (ref 6.5–8.1)

## 2022-04-22 LAB — TROPONIN I (HIGH SENSITIVITY): Troponin I (High Sensitivity): 3 ng/L (ref <18)

## 2022-04-22 MED ORDER — IPRATROPIUM-ALBUTEROL 0.5-2.5 (3) MG/3ML IN SOLN
3.0000 mL | Freq: Once | RESPIRATORY_TRACT | Status: AC
  Administered 2022-04-22: 3 mL via RESPIRATORY_TRACT
  Filled 2022-04-22: qty 3

## 2022-04-22 MED ORDER — PREDNISONE 20 MG PO TABS
60.0000 mg | ORAL_TABLET | Freq: Once | ORAL | Status: AC
  Administered 2022-04-22: 60 mg via ORAL
  Filled 2022-04-22: qty 3

## 2022-04-22 MED ORDER — ALBUTEROL SULFATE HFA 108 (90 BASE) MCG/ACT IN AERS: 2.0000 | INHALATION_SPRAY | Freq: Four times a day (QID) | RESPIRATORY_TRACT | 2 refills | Status: DC | PRN

## 2022-04-22 MED ORDER — ALBUTEROL SULFATE (2.5 MG/3ML) 0.083% IN NEBU
2.5000 mg | INHALATION_SOLUTION | Freq: Once | RESPIRATORY_TRACT | Status: AC
  Administered 2022-04-22: 2.5 mg via RESPIRATORY_TRACT
  Filled 2022-04-22: qty 3

## 2022-04-22 MED ORDER — LACTATED RINGERS IV BOLUS
1000.0000 mL | Freq: Once | INTRAVENOUS | Status: AC
  Administered 2022-04-22: 1000 mL via INTRAVENOUS

## 2022-04-22 MED ORDER — PREDNISONE 50 MG PO TABS: ORAL_TABLET | ORAL | 0 refills | Status: DC

## 2022-04-22 NOTE — Discharge Instructions (Addendum)
Your chest x-ray and blood work are reassuring.  Please take the prednisone once daily for the next 5 days.  Please use your albuterol inhaler 2 to 4 puffs every 4 hours.  If your breathing worsens and please return to the emergency department.

## 2022-04-22 NOTE — ED Provider Notes (Signed)
[facility] Provider Note    Event Date/Time   First MD Initiated Contact with Patient 04/22/22 1839     (approximate)   History   COPD   HPI  Franklin [name] is a 52 y.o. male past medical history of COPD, bipolar disorder, schizophrenia, substance use disorder who presents with shortness of breath.  Patient tells me that he has been short of breath for several days has cough productive of clear sputum no hemoptysis.  He does endorse generalized chest tightness that is nonexertional nonpleuritic.  Denies pain or swelling in his legs or history of DVT/PE.  Does use crack cocaine and drinks alcohol daily.  Last crack cocaine use was yesterday.  Last drink was yesterday.  Denies history of withdrawal or current withdrawal symptoms.  Denies fevers or chills.     Past Medical History:  Diagnosis Date   Anxiety    Bipolar 1 disorder    COPD (chronic obstructive pulmonary disease)    Depression    No pertinent past medical history    Schizophrenia    Substance abuse     Patient Active Problem List   Diagnosis Date Noted   Alcohol abuse with alcohol-induced mood disorder 02/02/2016   Generalized anxiety disorder    Substance abuse 03/22/2015   Tobacco use disorder 10/06/2014   Major depressive disorder, recurrent episode, severe, with psychotic behavior 10/05/2014   Cocaine use disorder, severe, dependence 10/02/2014   Major depressive disorder, recurrent, severe without psychotic features (HCC)    Alcohol use disorder, severe, dependence (HCC) 06/18/2014   Substance induced mood disorder 03/14/2013   Polysubstance abuse 04/08/2012     Physical Exam  Triage Vital Signs: ED Triage Vitals  Enc Vitals Group     BP 04/22/22 1828 (!) 122/100     Pulse Rate 04/22/22 1828 100     Resp 04/22/22 1828 (!) 22     Temp 04/22/22 1828 98.2 F (36.8 C)     Temp Source 04/22/22 1828 Oral     SpO2 04/22/22 1828 95 %     Weight 04/22/22 1829 150 lb (68 kg)      Height 04/22/22 1829  (1.778 m)     Head Circumference --      Peak Flow --      Pain Score 04/22/22 1828 8     Pain Loc --      Pain Edu? --      Excl. in GC? --     Most recent vital signs: Vitals:   04/22/22 1836 04/22/22 2130  BP:  138/84  Pulse: (!) 51 72  Resp: (!) 31 (!) 22  Temp:  98.4 F (36.9 C)  SpO2: 93% 94%     General: Awake, no distress.  CV:  Good peripheral perfusion.  Resp:  Normal effort.  No increased work of breathing does have diffuse wheezing with decreased air movement Abd:  No distention.  Neuro:             Awake, Alert, Oriented x 3  Other:  No peripheral edema or leg asymmetry   ED Results / Procedures / Treatments  Labs (all labs ordered are listed, but only abnormal results are displayed) Labs Reviewed  COMPREHENSIVE METABOLIC PANEL - Abnormal; Notable for the following components:      Result Value   Glucose, Bld 137 (*)    All other components within normal limits  CBC WITH DIFFERENTIAL/PLATELET - Abnormal; Notable for the following components:  Eosinophils Absolute 0.9 (*)    All other components within normal limits  TROPONIN I (HIGH SENSITIVITY)  TROPONIN I (HIGH SENSITIVITY)     EKG  EKG reviewed interpreted myself shows sinus rhythm with multiple PACs, normal axis and intervals, ST depression 3 aVF similar to prior   RADIOLOGY I reviewed and interpreted the CXR which does not show any acute cardiopulmonary process    PROCEDURES:  Critical Care performed: No  .1-3 Lead EKG Interpretation  Performed by: Georga Hacking, MD Authorized by: Georga Hacking, MD     Interpretation: abnormal     ECG rate assessment: tachycardic     Rhythm: sinus tachycardia     Ectopy: none     Conduction: normal     The patient is on the cardiac monitor to evaluate for evidence of arrhythmia and/or significant heart rate changes.   MEDICATIONS ORDERED IN ED: Medications  ipratropium-albuterol (DUONEB) 0.5-2.5 (3)  MG/3ML nebulizer solution 3 mL (3 mLs Nebulization Given 04/22/22 1918)  ipratropium-albuterol (DUONEB) 0.5-2.5 (3) MG/3ML nebulizer solution 3 mL (3 mLs Nebulization Given 04/22/22 1917)  ipratropium-albuterol (DUONEB) 0.5-2.5 (3) MG/3ML nebulizer solution 3 mL (3 mLs Nebulization Given 04/22/22 1917)  predniSONE (DELTASONE) tablet 60 mg (60 mg Oral Given 04/22/22 1916)  lactated ringers bolus 1,000 mL (1,000 mLs Intravenous New Bag/Given 04/22/22 2149)  albuterol (PROVENTIL) (2.5 MG/3ML) 0.083% nebulizer solution 2.5 mg (2.5 mg Nebulization Given 04/22/22 2149)     IMPRESSION / MDM / ASSESSMENT AND PLAN / ED COURSE  I reviewed the triage vital signs and the nursing notes.                              Patient's presentation is most consistent with acute complicated illness / injury requiring diagnostic workup.  Differential diagnosis includes, but is not limited to, COPD exacerbation, pneumonia, pneumothorax, CHF, pulmonary embolism  The patient is a 52 year old male with history of COPD cocaine and alcohol use disorder and bipolar and schizophrenia who presents because of dyspnea times several days.  He endorses cough that is productive of clear sputum and some generalized chest tightness is nonexertional nonpleuritic.  On arrival he is tachypneic nausea but when I evaluate him he has a normal respiratory rate and is not in respiratory distress with normal oxygen sat.  He is mildly tachycardic.  Is wheezing throughout with decreased air movement.  No signs of peripheral edema.  He denies any hemoptysis has no history of DVT/PE.  Suspect COPD exacerbation.  He is using crack cocaine but certainly precipitant.  Will give DuoNebs and steroids obtain chest x-ray labs EKG.  Patient's EKG does have some ST depression inferiorly but no other reciprocal change and he is not having typical chest pain and troponin is negative.  EKG does look similar to prior have low suspicion for ACS at this  time.  Patient's chest x-ray is clear.  The rest of his labs are reassuring.  On repeat assessment he is still complaining of dyspnea although his wheezing is much improved.  Will give additional neb.  Will also give a bolus as he is somewhat tachycardic.  Repeat assessment again after the additional albuterol he feels back to baseline.  Does still have some end expiratory wheezing but is moving good air is not hypoxic not tachypneic with normal work of breathing.  Will discharge and refill his albuterol nebulizer.  Will give 5 days of prednisone.  We discussed return  precautions.       FINAL CLINICAL IMPRESSION(S) / ED DIAGNOSES   Final diagnoses:  COPD exacerbation     Rx / DC Orders   ED Discharge Orders          Ordered    albuterol (VENTOLIN HFA) 108 (90 Base) MCG/ACT inhaler  Every 6 hours PRN        04/22/22 2259    predniSONE (DELTASONE) 50 MG tablet        04/22/22 2259             Note:  This document was prepared using Dragon voice recognition software and may include unintentional dictation errors.   Georga Hacking, MD 04/22/22 2259

## 2022-04-22 NOTE — ED Triage Notes (Signed)
Patient c/o COPD exacerbation for a few days. C/o pain all over. Patient drove to ER. No relief from inhaler

## 2022-04-27 ENCOUNTER — Emergency Department
Admission: EM | Admit: 2022-04-27 | Discharge: 2022-04-28 | Disposition: A | Discharge status: Home / Self Care | Payer: Medicaid Other | Attending: Emergency Medicine | Admitting: Emergency Medicine

## 2022-04-27 ENCOUNTER — Emergency Department: Payer: Medicaid Other

## 2022-04-27 ENCOUNTER — Other Ambulatory Visit: Payer: Self-pay

## 2022-04-27 DIAGNOSIS — F1910 Other psychoactive substance abuse, uncomplicated: Secondary | ICD-10-CM | POA: Diagnosis present

## 2022-04-27 DIAGNOSIS — R0602 Shortness of breath: Secondary | ICD-10-CM | POA: Diagnosis present

## 2022-04-27 DIAGNOSIS — F411 Generalized anxiety disorder: Secondary | ICD-10-CM | POA: Diagnosis present

## 2022-04-27 DIAGNOSIS — J189 Pneumonia, unspecified organism: Secondary | ICD-10-CM | POA: Diagnosis not present

## 2022-04-27 DIAGNOSIS — F1020 Alcohol dependence, uncomplicated: Secondary | ICD-10-CM | POA: Diagnosis present

## 2022-04-27 DIAGNOSIS — F1994 Other psychoactive substance use, unspecified with psychoactive substance-induced mood disorder: Secondary | ICD-10-CM | POA: Diagnosis present

## 2022-04-27 DIAGNOSIS — F332 Major depressive disorder, recurrent severe without psychotic features: Secondary | ICD-10-CM | POA: Diagnosis present

## 2022-04-27 DIAGNOSIS — J449 Chronic obstructive pulmonary disease, unspecified: Secondary | ICD-10-CM | POA: Insufficient documentation

## 2022-04-27 DIAGNOSIS — F333 Major depressive disorder, recurrent, severe with psychotic symptoms: Secondary | ICD-10-CM | POA: Diagnosis present

## 2022-04-27 DIAGNOSIS — Z720 Tobacco use: Secondary | ICD-10-CM | POA: Diagnosis present

## 2022-04-27 DIAGNOSIS — F17200 Nicotine dependence, unspecified, uncomplicated: Secondary | ICD-10-CM | POA: Diagnosis present

## 2022-04-27 DIAGNOSIS — F1420 Cocaine dependence, uncomplicated: Secondary | ICD-10-CM | POA: Diagnosis present

## 2022-04-27 DIAGNOSIS — F1014 Alcohol abuse with alcohol-induced mood disorder: Secondary | ICD-10-CM | POA: Diagnosis present

## 2022-04-27 LAB — COMPREHENSIVE METABOLIC PANEL
ALT: 13 U/L (ref 0–44)
AST: 22 U/L (ref 15–41)
Albumin: 4.3 g/dL (ref 3.5–5.0)
Alkaline Phosphatase: 91 U/L (ref 38–126)
Anion gap: 8 (ref 5–15)
BUN: 12 mg/dL (ref 6–20)
CO2: 30 mmol/L (ref 22–32)
Calcium: 9.7 mg/dL (ref 8.9–10.3)
Chloride: 102 mmol/L (ref 98–111)
Creatinine, Ser: 0.88 mg/dL (ref 0.61–1.24)
GFR, Estimated: >60 mL/min (ref >60)
Glucose, Bld: 196 mg/dL — ABNORMAL HIGH (ref 70–99)
Potassium: 3.9 mmol/L (ref 3.5–5.1)
Sodium: 140 mmol/L (ref 135–145)
Total Bilirubin: 0.7 mg/dL (ref 0.3–1.2)
Total Protein: 8.5 g/dL — ABNORMAL HIGH (ref 6.5–8.1)

## 2022-04-27 LAB — CBC
HCT: 48.9 % (ref 39.0–52.0)
Hemoglobin: 15.6 g/dL (ref 13.0–17.0)
MCH: 29.4 pg (ref 26.0–34.0)
MCHC: 31.9 g/dL (ref 30.0–36.0)
MCV: 92.3 fL (ref 80.0–100.0)
Platelets: 360 10*3/uL (ref 150–400)
RBC: 5.3 MIL/uL (ref 4.22–5.81)
RDW: 14.9 % (ref 11.5–15.5)
WBC: 11.5 10*3/uL — ABNORMAL HIGH (ref 4.0–10.5)
nRBC: 0 % (ref 0.0–0.2)

## 2022-04-27 LAB — TROPONIN I (HIGH SENSITIVITY): Troponin I (High Sensitivity): 6 ng/L (ref <18)

## 2022-04-27 MED ORDER — AZITHROMYCIN 250 MG PO TABS: ORAL_TABLET | ORAL | 0 refills | Status: DC

## 2022-04-27 MED ORDER — CEPHALEXIN 500 MG PO CAPS: 500.0000 mg | ORAL_CAPSULE | Freq: Two times a day (BID) | ORAL | 0 refills | Status: DC

## 2022-04-27 MED ORDER — LORAZEPAM 2 MG/ML IJ SOLN
1.0000 mg | Freq: Once | INTRAMUSCULAR | Status: AC
  Administered 2022-04-27: 1 mg via INTRAVENOUS
  Filled 2022-04-27: qty 1

## 2022-04-27 MED ORDER — PANTOPRAZOLE SODIUM 20 MG PO TBEC
20.0000 mg | DELAYED_RELEASE_TABLET | Freq: Every day | ORAL | Status: DC
  Administered 2022-04-27 – 2022-04-28 (×2): 20 mg via ORAL
  Filled 2022-04-27 (×2): qty 1

## 2022-04-27 MED ORDER — GABAPENTIN 100 MG PO CAPS
200.0000 mg | ORAL_CAPSULE | Freq: Three times a day (TID) | ORAL | Status: DC
  Administered 2022-04-27 – 2022-04-28 (×2): 200 mg via ORAL
  Filled 2022-04-27 (×2): qty 2

## 2022-04-27 MED ORDER — SERTRALINE HCL 50 MG PO TABS
50.0000 mg | ORAL_TABLET | Freq: Every day | ORAL | Status: DC
  Administered 2022-04-27 – 2022-04-28 (×2): 50 mg via ORAL
  Filled 2022-04-27 (×2): qty 1

## 2022-04-27 MED ORDER — ALBUTEROL SULFATE HFA 108 (90 BASE) MCG/ACT IN AERS: 2.0000 | INHALATION_SPRAY | RESPIRATORY_TRACT | Status: DC | PRN

## 2022-04-27 MED ORDER — ALBUTEROL SULFATE (2.5 MG/3ML) 0.083% IN NEBU
3.0000 mL | INHALATION_SOLUTION | RESPIRATORY_TRACT | Status: DC | PRN
  Administered 2022-04-27: 3 mL via RESPIRATORY_TRACT
  Filled 2022-04-27: qty 9

## 2022-04-27 MED ORDER — HYDROXYZINE HCL 25 MG PO TABS: 25.0000 mg | ORAL_TABLET | Freq: Four times a day (QID) | ORAL | Status: DC | PRN

## 2022-04-27 MED ORDER — SODIUM CHLORIDE 0.9 % IV BOLUS
1000.0000 mL | Freq: Once | INTRAVENOUS | Status: AC
  Administered 2022-04-27: 1000 mL via INTRAVENOUS

## 2022-04-27 MED ORDER — IPRATROPIUM-ALBUTEROL 0.5-2.5 (3) MG/3ML IN SOLN: 3.0000 mL | Freq: Once | RESPIRATORY_TRACT | Status: DC

## 2022-04-27 MED ORDER — ADULT MULTIVITAMIN W/MINERALS CH
1.0000 | ORAL_TABLET | Freq: Every day | ORAL | Status: DC
  Administered 2022-04-27 – 2022-04-28 (×2): 1 via ORAL
  Filled 2022-04-27 (×2): qty 1

## 2022-04-27 MED ORDER — QUETIAPINE FUMARATE 25 MG PO TABS
100.0000 mg | ORAL_TABLET | Freq: Every day | ORAL | Status: DC
  Administered 2022-04-27: 100 mg via ORAL
  Filled 2022-04-27: qty 4

## 2022-04-27 MED ORDER — NAPROXEN 500 MG PO TABS
500.0000 mg | ORAL_TABLET | Freq: Two times a day (BID) | ORAL | Status: DC
  Administered 2022-04-27 – 2022-04-28 (×2): 500 mg via ORAL
  Filled 2022-04-27 (×2): qty 1

## 2022-04-27 NOTE — ED Notes (Signed)
Pt. Sleeping in room.

## 2022-04-27 NOTE — ED Triage Notes (Addendum)
Pt was here recently with copd exacerbation, pt states that he isn't any better and feels like he has pneumonia, pt is breathing fast in triage and coughing, sitting semi tripod in a recliner. Pt states that he drank alcohol last pm and smoked crack as well, pt states that he feels a little confused at this time

## 2022-04-27 NOTE — ED Notes (Signed)
Pt. Sleeping on stretcher, positive chest rise and fall. Breathing regular and unlabored, NAD.

## 2022-04-27 NOTE — ED Provider Notes (Signed)
Emergency department handoff note  Care of this patient was signed out to me at the end of the previous provider shift.  All pertinent patient information was conveyed and all questions were answered.  Patient pending IV fluids prior to discharge.  Upon speaking to patient about his discharge plan, patient stated that he has no way of getting these medications as he does not have any money.  Patient is currently homeless.  Patient also states that he is having auditory/visual hallucinations and has not been taking his medication for the symptoms due to being out of money.  Patient requesting evaluation by psychiatry and social work which is appropriate at this time.  Patient pending evaluation by psychiatry and social work.   Merwyn Katos, MD 04/27/22 [phone]

## 2022-04-27 NOTE — Consult Note (Signed)
Franklin [name]   Reason for [name]: Shortness of Breath  Referring Physician: Dr. Lenard Lance Patient Identification: Franklin [name] MRN:  [id number] Principal Diagnosis: <principal problem not specified> Diagnosis:  Active Problems:   Polysubstance abuse   Substance induced mood disorder   Alcohol use disorder, severe, dependence (HCC)   Major depressive disorder, recurrent, severe without psychotic features (HCC)   Cocaine use disorder, severe, dependence   Major depressive disorder, recurrent episode, severe, with psychotic behavior   Tobacco use disorder   Substance abuse   Generalized anxiety disorder   Alcohol abuse with alcohol-induced mood disorder   Total Time spent with patient: 1 hour  Subjective: "I need to be admitted." Franklin [name] is a 52 y.o. male patient presented to  [facility] ED and was evaluated by this provider, with consultation from Dr. Vicente Males regarding his care on 04/27/2022. It was discussed with the EDP that the patient could remain in the ED for metabolizing illicit substances, with potential discharge in the morning if he remains mentally stable. During evaluation, the patient displayed alertness and orientation, along with calm and cooperative behavior. His mood was congruent with affect, and he did not appear to respond to internal or external stimuli. There were no signs of delusional thinking, auditory or visual hallucinations, suicidal, homicidal, or self-harm ideations, nor any psychotic or paranoid behaviors. The patient was able to answer questions appropriately during the encounter.  HPI: Per Dr. Lenard Lance, Franklin [name] is a 52 y.o. male with a past medical history of anxiety, bipolar, COPD, schizophrenia, substance abuse, presents to the emergency department for shortness of breath and cough.  According to the patient for the past 2 weeks or so he has been experiencing "a COPD exacerbation" leading to cough and he is concerned that he  has developed pneumonia per patient.  Patient does have an occasional cough during exam.  He does have rapid speech does admit to alcohol as well as crack cocaine use last night/early morning.  No fever.  No chest pain.   Past Psychiatric History:  Anxiety Bipolar 1 disorder Depression Schizophrenia Substance abuse   Risk to Self:   Risk to Others:   Prior Inpatient Therapy:   Prior Outpatient Therapy:    Past Medical History:  Past Medical History:  Diagnosis Date   Anxiety    Bipolar 1 disorder    COPD (chronic obstructive pulmonary disease)    Depression    No pertinent past medical history    Schizophrenia    Substance abuse     Past Surgical History:  Procedure Laterality Date   NO PAST SURGERIES     Family History:  Family History  Problem Relation Age of Onset   Hypertension Other    Diabetes Other    Family Psychiatric  History: History reviewed. No pertinent family psychiatric history. Social History:  Social History   Substance and Sexual Activity  Alcohol Use Yes   Alcohol/week: 12.0 standard drinks of alcohol   Types: 12 Cans of beer per week   Comment: He drinks one 12 pack of beer per day. There is a long history of alcohol use.     Social History   Substance and Sexual Activity  Drug Use Yes   Types: "Crack" cocaine, Cocaine   Comment: one gram daily    Social History   Socioeconomic History   Marital status: Single    Spouse name: Not on file   Number of children: Not on file   Years of education:  Not on file   Highest education level: Not on file  Occupational History   Occupation: [employer]  Tobacco Use   Smoking status: Every Day    Packs/day: .5    Types: Cigarettes   Smokeless tobacco: Never   Tobacco comments:    Patient refused  Vaping Use   Vaping Use: Never used  Substance and Sexual Activity   Alcohol use: Yes    Alcohol/week: 12.0 standard drinks of alcohol    Types: 12 Cans of beer per week    Comment: He drinks one 12  pack of beer per day. There is a long history of alcohol use.   Drug use: Yes    Types: "Crack" cocaine, Cocaine    Comment: one gram daily   Sexual activity: Yes  Other Topics Concern   Not on file  Social History Narrative   The patient was born and raised in with that by both his biological parents. His father's past way but his mother still living. He denies any history of any physical or sexual abuse. He says he completed 2 years of college at [employer]. 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 He has never been married but has a 84 year old son who lives with his mother. He says he does get to see his son and has a relationship with him. He is not currently dating or in a relationship.      The patient does have a history of a DUI and had a court date last Friday for a DUI. He denies any other pending charges.            Social Determinants of Health   Financial Resource Strain: Not on file  Food Insecurity: Not on file  Transportation Needs: Not on file  Physical Activity: Not on file  Stress: Not on file  Social Connections: Not on file   Additional Social History:    Allergies:  No Known Allergies  Labs:  Results for orders placed or performed during the hospital encounter of 04/27/22 (from the past 48 hour(s))  CBC     Status: Abnormal   Collection Time: 04/27/22 11:59 AM  Result Value Ref Range   WBC 11.5 (H) 4.0 - 10.5 K/uL   RBC 5.30 4.22 - 5.81 MIL/uL   Hemoglobin 15.6 13.0 - 17.0 g/dL   HCT 16.1 09.6 - 04.5 %   MCV 92.3 80.0 - 100.0 fL   MCH 29.4 26.0 - 34.0 pg   MCHC 31.9 30.0 - 36.0 g/dL   RDW 40.9 81.1 - 91.4 %   Platelets 360 150 - 400 K/uL   nRBC 0.0 0.0 - 0.2 %    Comment: Performed at [facility], [address] [place], [place] [address]  Comprehensive metabolic panel     Status: Abnormal   Collection Time: 04/27/22 11:59 AM  Result Value Ref Range    Sodium 140 135 - 145 mmol/L   Potassium 3.9 3.5 - 5.1 mmol/L   Chloride 102 98 - 111 mmol/L   CO2 30 22 - 32 mmol/L   Glucose, Bld 196 (H) 70 - 99 mg/dL    Comment: Glucose reference range applies only to samples taken after fasting for at least 8 hours.   BUN 12 6 - 20 mg/dL   Creatinine, Ser 6.21 0.61 - 1.24 mg/dL   Calcium 9.7 8.9 - 30.8 mg/dL   Total Protein 8.5 (  H) 6.5 - 8.1 g/dL   Albumin 4.3 3.5 - 5.0 g/dL   AST 22 15 - 41 U/L   ALT 13 0 - 44 U/L   Alkaline Phosphatase 91 38 - 126 U/L   Total Bilirubin 0.7 0.3 - 1.2 mg/dL   GFR, Estimated >69 >62 mL/min    Comment: (NOTE) Calculated using the CKD-EPI Creatinine Equation (2021)    Anion gap 8 5 - 15    Comment: Performed at [facility], [address] [place], [place] [address]  Troponin I (High Sensitivity)     Status: None   Collection Time: 04/27/22 11:59 AM  Result Value Ref Range   Troponin I (High Sensitivity) 6 <18 ng/L    Comment: (NOTE) Elevated high sensitivity troponin I (hsTnI) values and significant  changes across serial measurements may suggest ACS but many other  chronic and acute conditions are known to elevate hsTnI results.  Refer to the "Links" section for chest pain algorithms and additional  guidance. Performed at [facility], [address] [place], [place] [address]     Current Facility-Administered Medications  Medication Dose Route Frequency Provider Last Rate Last Admin   albuterol (PROVENTIL) (2.5 MG/3ML) 0.083% nebulizer solution 3 mL  3 mL Inhalation Q2H PRN Minna Antis, MD   3 mL at 04/27/22 1200   albuterol (VENTOLIN HFA) 108 (90 Base) MCG/ACT inhaler 2 puff  2 puff Inhalation Q4H PRN Merwyn Katos, MD       gabapentin (NEURONTIN) capsule 200 mg  200 mg Oral TID Merwyn Katos, MD       hydrOXYzine (ATARAX) tablet 25 mg  25 mg Oral Q6H PRN Merwyn Katos, MD       ipratropium-albuterol (DUONEB) 0.5-2.5 (3) MG/3ML nebulizer solution 3 mL  3 mL  Nebulization Once Minna Antis, MD       multivitamin with minerals tablet 1 tablet  1 tablet Oral Daily Bradler, Clent Jacks, MD       naproxen (NAPROSYN) tablet 500 mg  500 mg Oral BID Merwyn Katos, MD       pantoprazole (PROTONIX) EC tablet 20 mg  20 mg Oral Daily Bradler, Clent Jacks, MD       QUEtiapine (SEROQUEL) tablet 100 mg  100 mg Oral QHS Merwyn Katos, MD       sertraline (ZOLOFT) tablet 50 mg  50 mg Oral Daily Merwyn Katos, MD       Current Outpatient Medications  Medication Sig Dispense Refill   albuterol (VENTOLIN HFA) 108 (90 Base) MCG/ACT inhaler Inhale 2 puffs into the lungs every 4 (four) hours as needed for wheezing or shortness of breath. 1 each 0   azithromycin (ZITHROMAX Z-PAK) 250 MG tablet Take 2 tablets (500 mg) on  Day 1,  followed by 1 tablet (250 mg) once daily on Days 2 through 5. 6 each 0   cephALEXin (KEFLEX) 500 MG capsule Take 1 capsule (500 mg total) by mouth 2 (two) times daily. 14 capsule 0   predniSONE (DELTASONE) 50 MG tablet Take 1 pill daily for the next 5 days 5 tablet 0   chlordiazePOXIDE (LIBRIUM) 25 MG capsule 50mg  PO TID x 1D, then 25-50mg  PO BID X 1D, then 25-50mg  PO QD X 1D (Patient not taking: Reported on 04/27/2022) 10 capsule 0   gabapentin (NEURONTIN) 100 MG capsule Take 2 capsules (200 mg total) by mouth 3 (three) times daily. (Patient not taking: Reported on 04/27/2022) 126 capsule 0  hydrOXYzine (ATARAX/VISTARIL) 25 MG tablet Take 1 tablet (25 mg total) by mouth every 6 (six) hours as needed for anxiety. (Patient not taking: Reported on 04/27/2022) 84 tablet 0   Multiple Vitamin (MULTIVITAMIN WITH MINERALS) TABS tablet Take 1 tablet by mouth daily. (Patient not taking: Reported on 04/27/2022)     naproxen (NAPROSYN) 500 MG tablet Take 1 tablet (500 mg total) by mouth 2 (two) times daily. (Patient not taking: Reported on 04/27/2022) 20 tablet 0   pantoprazole (PROTONIX) 20 MG tablet Take 1 tablet (20 mg total) by mouth daily. (Patient not  taking: Reported on 04/27/2022) 21 tablet 0   predniSONE (DELTASONE) 20 MG tablet Take 3 tablets (60 mg total) by mouth daily. (Patient not taking: Reported on 04/27/2022) 15 tablet 0   QUEtiapine (SEROQUEL) 100 MG tablet Take 1 tablet (100 mg total) by mouth at bedtime. (Patient not taking: Reported on 10/13/2016) 21 tablet 0   sertraline (ZOLOFT) 50 MG tablet Take 1 tablet (50 mg total) by mouth daily. (Patient not taking: Reported on 04/27/2022) 21 tablet 0    Musculoskeletal: Strength & Muscle Tone: within normal limits Gait & Station: normal Patient leans: Left  Psychiatric Specialty Exam:  Presentation  General Appearance:  Appropriate for Environment  Eye Contact: Good  Speech: Clear and Coherent  Speech Volume: Normal  Handedness: Right   Mood and Affect  Mood: Euthymic  Affect: Congruent   Thought Process  Thought Processes: Coherent  Descriptions of Associations:Intact  Orientation:Full (Time, Place and Person)  Thought Content:Logical  History of Schizophrenia/Schizoaffective disorder:No data recorded Duration of Psychotic Symptoms:No data recorded Hallucinations:Hallucinations: None  Ideas of Reference:None  Suicidal Thoughts:Suicidal Thoughts: Yes, Active SI Active Intent and/or Plan: With Plan  Homicidal Thoughts:Homicidal Thoughts: No   Sensorium  Memory: Immediate Good; Recent Good; Remote Good  Judgment: Fair  Insight: Fair   Chartered certified accountant: Fair  Attention Span: Fair  Recall: Fair  Fund of Knowledge: Fair  Language: Good   Psychomotor Activity  Psychomotor Activity: Psychomotor Activity: Normal   Assets  Assets: Social Support; Physical Health   Sleep  Sleep: Sleep: Good   Physical Exam: Physical Exam Vitals and nursing note reviewed.  Constitutional:      Appearance: Normal appearance. He is normal weight.  HENT:     Head: Normocephalic and atraumatic.     Right Ear:  External ear normal.     Left Ear: External ear normal.     Nose: Nose normal.     Mouth/Throat:     Mouth: Mucous membranes are moist.  Eyes:     Conjunctiva/sclera: Conjunctivae normal.  Cardiovascular:     Rate and Rhythm: Tachycardia present.  Pulmonary:     Effort: Pulmonary effort is normal.  Musculoskeletal:        General: Normal range of motion.     Cervical back: Normal range of motion and neck supple.  Neurological:     General: No focal deficit present.     Mental Status: He is alert and oriented to person, place, and time.  Psychiatric:        Attention and Perception: Attention and perception normal.        Mood and Affect: Mood is anxious and depressed.        Speech: Speech normal.        Behavior: Behavior normal. Behavior is cooperative.        Thought Content: Thought content includes suicidal ideation.        Cognition  and Memory: Cognition and memory normal.        Judgment: Judgment is inappropriate.   Review of Systems  Psychiatric/Behavioral:  Positive for depression, substance abuse and suicidal ideas. The patient is nervous/anxious.   All other systems reviewed and are negative.  Blood pressure (!) 167/89, pulse (!) 109, temperature 98.3 F (36.8 C), temperature source Oral, resp. rate (!) 26, height 5\' 10"  (1.778 m), weight 68 kg, SpO2 97 %. Body mass index is 21.52 kg/m.  Treatment Plan Summary: Plan   It was discussed with the EDP that the patient may remain in the ED to metabolize illicit substances, and he can be discharged in the morning considering he remains mentally stable.   Disposition: Supportive therapy provided about ongoing stressors. It was discussed with the EDP that the patient may remain in the ED to metabolize illicit substances, and he can be discharged in the morning considering he remains mentally stable.   Franklin Murdoch, NP 04/27/2022 10:37 PM

## 2022-04-27 NOTE — ED Notes (Signed)
ED Provider at bedside. 

## 2022-04-27 NOTE — ED Notes (Signed)
Pt. Sleeping on stretcher, positive chest rise and fall. Breathing regular and unlabored, NAD. 

## 2022-04-27 NOTE — ED Notes (Signed)
This RN to bedside to discharge pt. Pt. Woke up and begins talking loudly and fast, states he will not leave until he speaks with the MD again. Dr. Vicente Males notified, goes in to speak with pt. See orders.

## 2022-04-27 NOTE — ED Notes (Signed)
Pt. Provided sandwich tray, and drink, ate entire meal and went back to sleep. Pt. Sleeping on stretcher, positive chest rise and fall. Breathing regular and unlabored, NAD.

## 2022-04-27 NOTE — ED Provider Notes (Addendum)
[facility] Provider Note    Event Date/Time   First MD Initiated Contact with Patient 04/27/22 1153     (approximate)  History   Chief Complaint: Shortness of Breath  HPI  Franklin [name] is a 52 y.o. male with a past medical history of anxiety, bipolar, COPD, schizophrenia, substance abuse, presents to the emergency department for shortness of breath and cough.  According to the patient for the past 2 weeks or so he has been experiencing "a COPD exacerbation" leading to cough and he is concerned that he has developed pneumonia per patient.  Patient does have an occasional cough during exam.  He does have rapid speech does admit to alcohol as well as crack cocaine use last night/early morning.  No fever.  No chest pain.  Physical Exam   Triage Vital Signs: ED Triage Vitals  Enc Vitals Group     BP 04/27/22 1144 (!) 200/117     Pulse Rate 04/27/22 1144 95     Resp 04/27/22 1144 (!) 30     Temp 04/27/22 1144 98.3 F (36.8 C)     Temp Source 04/27/22 1144 Oral     SpO2 04/27/22 1144 97 %     Weight 04/27/22 1146 150 lb (68 kg)     Height 04/27/22 1146  (1.778 m)     Head Circumference --      Peak Flow --      Pain Score 04/27/22 1146 10     Pain Loc --      Pain Edu? --      Excl. in GC? --     Most recent vital signs: Vitals:   04/27/22 1215 04/27/22 1230  BP:  (!) 145/83  Pulse: (!) 115 (!) 107  Resp: (!) 24 (!) 22  Temp:    SpO2:      General: Awake, no distress.  Very active, rapid speech. CV:  Good peripheral perfusion.  Regular rate and rhythm  Resp:  Normal effort.  Equal breath sounds bilaterally.  No obvious wheeze rales or rhonchi.  Good air movement. Abd:  No distention.  Soft, nontender.  No rebound or guarding.   ED Results / Procedures / Treatments   EKG  EKG viewed and interpreted by myself shows sinus tachycardia 108 bpm with a narrow QRS, normal axis, normal intervals, nonspecific ST changes.  RADIOLOGY  Chest  x-ray viewed and interpreted by myself shows no obvious consolidation on my evaluation. Radiology has read the x-ray as emphysema with superimposed midlung pneumonia.   MEDICATIONS ORDERED IN ED: Medications  albuterol (PROVENTIL) (2.5 MG/3ML) 0.083% nebulizer solution 3 mL (3 mLs Inhalation Given 04/27/22 1200)  ipratropium-albuterol (DUONEB) 0.5-2.5 (3) MG/3ML nebulizer solution 3 mL (has no administration in time range)  LORazepam (ATIVAN) injection 1 mg (1 mg Intravenous Given 04/27/22 1207)  sodium chloride 0.9 % bolus 1,000 mL (1,000 mLs Intravenous New Bag/Given 04/27/22 1200)     IMPRESSION / MDM / ASSESSMENT AND PLAN / ED COURSE  I reviewed the triage vital signs and the nursing notes.  Patient's presentation is most consistent with acute presentation with potential threat to life or bodily function.  Patient presents emergency department for shortness of breath and cough.  States has been ongoing for nearly 2 weeks.  He does admit to crack cocaine use last night as well as alcohol use.  Patient has an occasional cough during exam.  Patient's workup shows a CBC with a white blood cell count  of 11,500, chemistry is reassuring.  Troponin reassuringly negative.  Patient's chest x-ray shows possible left midlung pneumonia.  Patient has been given fluids and a small dose of Ativan as patient appeared very anxious upon arrival.  He is much more calm now.  Given the chest x-ray findings with persistent cough we will cover with antibiotics and Zithromax and Keflex and have the patient follow-up with his doctor in the next several days for recheck/reevaluation.   FINAL CLINICAL IMPRESSION(S) / ED DIAGNOSES   Community-acquired pneumonia    Note:  This document was prepared using Dragon voice recognition software and may include unintentional dictation errors.   Minna Antis, MD 04/27/22 1431    Minna Antis, MD 04/27/22 [phone]

## 2022-04-28 ENCOUNTER — Other Ambulatory Visit: Payer: Self-pay

## 2022-04-28 DIAGNOSIS — F1910 Other psychoactive substance abuse, uncomplicated: Secondary | ICD-10-CM

## 2022-04-28 MED ORDER — AZITHROMYCIN 250 MG PO TABS
ORAL_TABLET | ORAL | 0 refills | Status: AC
  Filled 2022-04-28: qty 6, 5d supply, fill #0

## 2022-04-28 MED ORDER — CEPHALEXIN 500 MG PO CAPS
500.0000 mg | ORAL_CAPSULE | Freq: Two times a day (BID) | ORAL | 0 refills | Status: DC
  Filled 2022-04-28: qty 14, 7d supply, fill #0

## 2022-04-28 NOTE — ED Notes (Signed)
Spoke with Social worker Karma Greaser, whom informed me that pharmacy takes care of making sure the patient is able to get medication now. Pharmacist Jill Alexanders added to secure chat conversation.

## 2022-04-28 NOTE — ED Notes (Signed)
Pt given a sandwich tray ?

## 2022-04-28 NOTE — ED Notes (Signed)
Pharmacy at bedside assisting pt with medication.

## 2022-04-28 NOTE — ED Notes (Signed)
Patient resting with eyes closed, no needs verbalized at this time.

## 2022-04-28 NOTE — ED Notes (Signed)
Sent message to Social worker [place] via secure chat, to verify that someone will be coming by to speak with patient about him getting assistance to get his medications.

## 2022-04-28 NOTE — BH Assessment (Signed)
Comprehensive Clinical Assessment (CCA) Screening, Triage and Referral Note  04/28/2022 Franklin [name] [id number] Recommendations for Services/Supports/Treatments: Consulted with Franklin [name]. NP, who determined pt. needs continued observation and can be discharged in the AM when medically cleared. Franklin [name] is a 52 year old, English speaking, Black male with psych history of MDD, Alcohol Use Disorder, severe, Cocaine use disorder, severe, chronic polysubstance abuse GAD, Pt presented to [facility] ED voluntarily for a mental health evaluation. Per patient report, his main stressors are having to sleep in his car, unstable housing, financial problems, an inability to access his psych medicines, and feelings of hopelessness. Pt endorsed having depression that impacts his ability to function. Pt had good insight and impaired judgement. Pt had rapid speech and his thoughts were relevant. Pt was cooperative throughout the assessment. Pt presented with a depressed mood and a congruent affect. Pt had an unremarkable appearance. Pt admitted to cocaine and alcohol use 04/26/22. Pt reported that his thoughts of SI vacillates. Pt denied HI/AV/H. Pt's BAL is unremarkable and UDS +for cocaine.   Chief Complaint:  Chief Complaint  Patient presents with   Shortness of Breath   Visit Diagnosis:  Polysubstance abuse   Substance induced mood disorder   Alcohol use disorder, severe, dependence (HCC)   Major depressive disorder, recurrent, severe without psychotic features (HCC)   Cocaine use disorder, severe, dependence   Major depressive disorder, recurrent episode, severe, with psychotic behavior   Tobacco use disorder   Substance abuse   Generalized anxiety disorder   Alcohol abuse with alcohol-induced mood disorder    Patient Reported Information How did you hear about [place]? Self  What Is the Reason for Your Visit/Call Today? Pt was here recently with copd exacerbation, pt states that he isn't any better and feels  like he has pneumonia, pt is breathing fast in triage and coughing, sitting semi tripod in a recliner. Pt states that he drank alcohol last pm and smoked crack as well, pt states that he feels a little confused at this time  How Long Has This Been Causing You Problems? 1-6 months  What Do You Feel Would Help You the Most Today? Medication(s); Treatment for Depression or other mood problem   Have You Recently Had Any Thoughts About Hurting Yourself? Yes  Are You Planning to Commit Suicide/Harm Yourself At This time? No   Have you Recently Had Thoughts About Hurting Someone Franklin [name]? No  Are You Planning to Harm Someone at This Time? No  Explanation: Pt exhibits manipulative and goal directed behavior.   Have You Used Any Alcohol or Drugs in the Past 24 Hours? No  How Long Ago Did You Use Drugs or Alcohol? No data recorded What Did You Use and How Much? Pt admits to ETOH and crack cocaine use.   Do You Currently Have a Therapist/Psychiatrist? No  Name of Therapist/Psychiatrist: n/a   Have You Been Recently Discharged From Any Office Practice or Programs? No  Explanation of Discharge From Practice/Program: n/a    CCA Screening Triage Referral Assessment Type of Contact: Face-to-Face  Telemedicine Service Delivery:   Is this Initial or Reassessment?   Date Telepsych consult ordered in [facility]:    Time Telepsych consult ordered in [facility]:    Location of Assessment: [facility] ED  Provider Location: [facility] ED    Collateral Involvement: None provided   Does Patient Have a Court Appointed Legal Guardian? No data recorded Name and Contact of Legal Guardian: No data recorded If Minor and Not Living with Parent(s), Who  has Custody? n/a  Is CPS involved or ever been involved? Never  Is APS involved or ever been involved? Never   Patient Determined To Be At Risk for Harm To Self or Others Based on Review of Patient Reported Information or Presenting Complaint? No  Method: No  Plan  Availability of Means: No access or NA  Intent: Vague intent or NA  Notification Required: No need or identified person  Additional Information for Danger to Others Potential: -- (n/a)  Additional Comments for Danger to Others Potential: n/a  Are There Guns or Other Weapons in Your Home? No  Types of Guns/Weapons: n/a  Are These Weapons Safely Secured?                            No  Who Could Verify You Are Able To Have These Secured: n/a  Do You Have any Outstanding Charges, Pending Court Dates, Parole/Probation? None reported  Contacted To Inform of Risk of Harm To Self or Others: -- (n/a)   Does Patient Present under Involuntary Commitment? No    [place] of Residence: Franklin [name]   Patient Currently Receiving the Following Services: Not Receiving Services   Determination of Need: Emergent (2 hours)   Options For Referral: ED Visit   Discharge Disposition:     [place] Zou R Ariell Gunnels, LCAS

## 2022-04-28 NOTE — ED Notes (Signed)
Patient given breakfast tray.

## 2022-04-28 NOTE — ED Notes (Signed)
Pt given a lunch tray, and to be discharged to the lobby after meal completed.

## 2022-04-28 NOTE — ED Notes (Signed)
Patient given sandwich box and water. Patient resting in bed with eyes closed.

## 2022-04-28 NOTE — TOC CM/SW Note (Signed)
Pharmacy is aware of medication assistance needs.  Charlynn Court, CSW [phone]

## 2022-05-05 ENCOUNTER — Emergency Department (HOSPITAL_COMMUNITY)
Admission: EM | Admit: 2022-05-05 | Discharge: 2022-05-07 | Disposition: A | Discharge status: Home / Self Care | Payer: Medicaid Other | Attending: Emergency Medicine | Admitting: Emergency Medicine

## 2022-05-05 ENCOUNTER — Other Ambulatory Visit: Payer: Self-pay

## 2022-05-05 ENCOUNTER — Emergency Department (HOSPITAL_COMMUNITY): Payer: Medicaid Other

## 2022-05-05 ENCOUNTER — Encounter (HOSPITAL_COMMUNITY): Payer: Self-pay

## 2022-05-05 DIAGNOSIS — F1414 Cocaine abuse with cocaine-induced mood disorder: Secondary | ICD-10-CM | POA: Insufficient documentation

## 2022-05-05 DIAGNOSIS — F333 Major depressive disorder, recurrent, severe with psychotic symptoms: Secondary | ICD-10-CM | POA: Diagnosis present

## 2022-05-05 DIAGNOSIS — F1994 Other psychoactive substance use, unspecified with psychoactive substance-induced mood disorder: Secondary | ICD-10-CM | POA: Diagnosis present

## 2022-05-05 DIAGNOSIS — R443 Hallucinations, unspecified: Secondary | ICD-10-CM

## 2022-05-05 DIAGNOSIS — J441 Chronic obstructive pulmonary disease with (acute) exacerbation: Secondary | ICD-10-CM | POA: Diagnosis not present

## 2022-05-05 DIAGNOSIS — F17210 Nicotine dependence, cigarettes, uncomplicated: Secondary | ICD-10-CM | POA: Diagnosis not present

## 2022-05-05 DIAGNOSIS — R0602 Shortness of breath: Secondary | ICD-10-CM | POA: Diagnosis present

## 2022-05-05 DIAGNOSIS — Z20822 Contact with and (suspected) exposure to covid-19: Secondary | ICD-10-CM | POA: Insufficient documentation

## 2022-05-05 LAB — RAPID URINE DRUG SCREEN, HOSP PERFORMED
Amphetamines: NOT DETECTED
Barbiturates: NOT DETECTED
Benzodiazepines: NOT DETECTED
Cocaine: POSITIVE — AB
Opiates: NOT DETECTED
Tetrahydrocannabinol: NOT DETECTED

## 2022-05-05 LAB — SALICYLATE LEVEL: Salicylate Lvl: <7 mg/dL — ABNORMAL LOW (ref 7.0–30.0)

## 2022-05-05 LAB — CBC WITH DIFFERENTIAL/PLATELET
Abs Immature Granulocytes: 0.02 10*3/uL (ref 0.00–0.07)
Basophils Absolute: 0 10*3/uL (ref 0.0–0.1)
Basophils Relative: 1 %
Eosinophils Absolute: 0.2 10*3/uL (ref 0.0–0.5)
Eosinophils Relative: 2 %
HCT: 41.4 % (ref 39.0–52.0)
Hemoglobin: 13.5 g/dL (ref 13.0–17.0)
Immature Granulocytes: 0 %
Lymphocytes Relative: 9 %
Lymphs Abs: 0.8 10*3/uL (ref 0.7–4.0)
MCH: 30.1 pg (ref 26.0–34.0)
MCHC: 32.6 g/dL (ref 30.0–36.0)
MCV: 92.4 fL (ref 80.0–100.0)
Monocytes Absolute: 0.1 10*3/uL (ref 0.1–1.0)
Monocytes Relative: 1 %
Neutro Abs: 7.7 10*3/uL (ref 1.7–7.7)
Neutrophils Relative %: 87 %
Platelets: 351 10*3/uL (ref 150–400)
RBC: 4.48 MIL/uL (ref 4.22–5.81)
RDW: 14.8 % (ref 11.5–15.5)
WBC: 8.8 10*3/uL (ref 4.0–10.5)
nRBC: 0 % (ref 0.0–0.2)

## 2022-05-05 LAB — COMPREHENSIVE METABOLIC PANEL
ALT: 19 U/L (ref 0–44)
AST: 24 U/L (ref 15–41)
Albumin: 3.5 g/dL (ref 3.5–5.0)
Alkaline Phosphatase: 108 U/L (ref 38–126)
Anion gap: 11 (ref 5–15)
BUN: 21 mg/dL — ABNORMAL HIGH (ref 6–20)
CO2: 23 mmol/L (ref 22–32)
Calcium: 9 mg/dL (ref 8.9–10.3)
Chloride: 105 mmol/L (ref 98–111)
Creatinine, Ser: 1.06 mg/dL (ref 0.61–1.24)
GFR, Estimated: >60 mL/min (ref >60)
Glucose, Bld: 198 mg/dL — ABNORMAL HIGH (ref 70–99)
Potassium: 4.3 mmol/L (ref 3.5–5.1)
Sodium: 139 mmol/L (ref 135–145)
Total Bilirubin: 0.4 mg/dL (ref 0.3–1.2)
Total Protein: 6.8 g/dL (ref 6.5–8.1)

## 2022-05-05 LAB — ACETAMINOPHEN LEVEL: Acetaminophen (Tylenol), Serum: <10 ug/mL — ABNORMAL LOW (ref 10–30)

## 2022-05-05 LAB — TROPONIN I (HIGH SENSITIVITY): Troponin I (High Sensitivity): 5 ng/L (ref <18)

## 2022-05-05 LAB — ETHANOL: Alcohol, Ethyl (B): <10 mg/dL (ref <10)

## 2022-05-05 MED ORDER — IPRATROPIUM-ALBUTEROL 0.5-2.5 (3) MG/3ML IN SOLN
3.0000 mL | RESPIRATORY_TRACT | Status: DC | PRN
  Administered 2022-05-06: 3 mL via RESPIRATORY_TRACT
  Filled 2022-05-05 (×3): qty 3

## 2022-05-05 MED ORDER — LORAZEPAM 1 MG PO TABS: 0.0000 mg | ORAL_TABLET | Freq: Two times a day (BID) | ORAL | Status: DC

## 2022-05-05 MED ORDER — LORAZEPAM 1 MG PO TABS
0.0000 mg | ORAL_TABLET | Freq: Four times a day (QID) | ORAL | Status: DC
  Administered 2022-05-06: 1 mg via ORAL
  Filled 2022-05-05: qty 1

## 2022-05-05 MED ORDER — IPRATROPIUM-ALBUTEROL 0.5-2.5 (3) MG/3ML IN SOLN
6.0000 mL | Freq: Once | RESPIRATORY_TRACT | Status: AC
  Administered 2022-05-05: 6 mL via RESPIRATORY_TRACT
  Filled 2022-05-05: qty 6

## 2022-05-05 MED ORDER — LORAZEPAM 2 MG/ML IJ SOLN: 0.0000 mg | Freq: Four times a day (QID) | INTRAMUSCULAR | Status: DC

## 2022-05-05 MED ORDER — LORAZEPAM 2 MG/ML IJ SOLN: 0.0000 mg | Freq: Two times a day (BID) | INTRAMUSCULAR | Status: DC

## 2022-05-05 NOTE — ED Provider Notes (Signed)
[facility] EMERGENCY DEPARTMENT AT [facility] Provider Note   CSN: [id number] Arrival date & time: 05/05/22  1912     History  Chief Complaint  Patient presents with   Shortness of Breath    Franklin [name] is a 52 y.o. male.  52 year old male with past medical history significant for COPD, schizophrenia presents today for worsening, shortness of breath and wheezing that has been worsening over the past 2 days.  He received multiple medications including DuoNebs, Solu-Medrol, magnesium and route with significant improvement in his symptoms.  He states he is also hearing voices, and is occasionally suicidal however not currently.  He states he typically hears the voices which occasionally tell him to hurt himself, hurt someone else when he is alone.  He states he was previously recommended to go to behavioral health facility however at that time he was not ready and he lied to the psychiatrist to get out of being placed there and voluntarily.  He states now he is ready and will be honest and is requesting a psych eval.  The history is provided by the patient. No language interpreter was used.       Home Medications Prior to Admission medications   Medication Sig Start Date End Date Taking? Authorizing Provider  albuterol (VENTOLIN HFA) 108 (90 Base) MCG/ACT inhaler Inhale 2 puffs into the lungs every 4 (four) hours as needed for wheezing or shortness of breath. 08/10/21   Ward, Layla Maw, DO  cephALEXin (KEFLEX) 500 MG capsule Take 1 capsule (500 mg total) by mouth 2 (two) times daily. 04/28/22   Minna Antis, MD  chlordiazePOXIDE (LIBRIUM) 25 MG capsule 50mg  PO TID x 1D, then 25-50mg  PO BID X 1D, then 25-50mg  PO QD X 1D Patient not taking: Reported on 04/27/2022 10/13/16   Tilden Fossa, MD  gabapentin (NEURONTIN) 100 MG capsule Take 2 capsules (200 mg total) by mouth 3 (three) times daily. Patient not taking: Reported on 04/27/2022 02/04/16   Laveda Abbe, NP   hydrOXYzine (ATARAX/VISTARIL) 25 MG tablet Take 1 tablet (25 mg total) by mouth every 6 (six) hours as needed for anxiety. Patient not taking: Reported on 04/27/2022 02/04/16   Laveda Abbe, NP  Multiple Vitamin (MULTIVITAMIN WITH MINERALS) TABS tablet Take 1 tablet by mouth daily. Patient not taking: Reported on 04/27/2022    [provider]  naproxen (NAPROSYN) 500 MG tablet Take 1 tablet (500 mg total) by mouth 2 (two) times daily. Patient not taking: Reported on 04/27/2022 10/20/16   Donnetta Hutching, MD  pantoprazole (PROTONIX) 20 MG tablet Take 1 tablet (20 mg total) by mouth daily. Patient not taking: Reported on 04/27/2022 02/05/16   Laveda Abbe, NP  predniSONE (DELTASONE) 20 MG tablet Take 3 tablets (60 mg total) by mouth daily. Patient not taking: Reported on 04/27/2022 08/10/21   Ward, Layla Maw, DO  predniSONE (DELTASONE) 50 MG tablet Take 1 pill daily for the next 5 days 04/22/22   Georga Hacking, MD  QUEtiapine (SEROQUEL) 100 MG tablet Take 1 tablet (100 mg total) by mouth at bedtime. Patient not taking: Reported on 10/13/2016 02/04/16   Laveda Abbe, NP  sertraline (ZOLOFT) 50 MG tablet Take 1 tablet (50 mg total) by mouth daily. Patient not taking: Reported on 04/27/2022 02/05/16   Laveda Abbe, NP      Allergies    Patient has no known allergies.    Review of Systems   Review of Systems  Constitutional:  Negative  for chills and fever.  Respiratory:  Positive for shortness of breath and wheezing.   Cardiovascular:  Negative for chest pain.  Gastrointestinal:  Negative for abdominal pain.  Psychiatric/Behavioral:  Positive for hallucinations. Negative for suicidal ideas.   All other systems reviewed and are negative.   Physical Exam Updated Vital Signs BP (!) 146/89   Pulse 76   Temp 98.1 F (36.7 C) (Oral)   Resp 19   Ht 5\' 10"  (1.778 m)   Wt 72.6 kg   SpO2 98%   BMI 22.96 kg/m  Physical Exam Vitals and nursing note reviewed.   Constitutional:      General: He is not in acute distress.    Appearance: Normal appearance. He is not ill-appearing.  HENT:     Head: Normocephalic and atraumatic.     Nose: Nose normal.  Eyes:     General: No scleral icterus.    Extraocular Movements: Extraocular movements intact.     Conjunctiva/sclera: Conjunctivae normal.  Cardiovascular:     Rate and Rhythm: Normal rate and regular rhythm.     Pulses: Normal pulses.  Pulmonary:     Effort: Pulmonary effort is normal. No respiratory distress.     Breath sounds: Wheezing (Mild scattered wheezing) present. No rales.  Abdominal:     General: There is no distension.     Tenderness: There is no abdominal tenderness.  Musculoskeletal:        General: Normal range of motion.     Cervical back: Normal range of motion.  Skin:    General: Skin is warm and dry.  Neurological:     General: No focal deficit present.     Mental Status: He is alert and oriented to person, place, and time. Mental status is at baseline.  Psychiatric:        Speech: Speech normal.        Thought Content: Thought content does not include suicidal ideation. Thought content does not include suicidal plan.     ED Results / Procedures / Treatments   Labs (all labs ordered are listed, but only abnormal results are displayed) Labs Reviewed  ACETAMINOPHEN LEVEL  CBC WITH DIFFERENTIAL/PLATELET  COMPREHENSIVE METABOLIC PANEL  ETHANOL  RAPID URINE DRUG SCREEN, HOSP PERFORMED  SALICYLATE LEVEL  TROPONIN I (HIGH SENSITIVITY)    EKG None  Radiology No results found.  Procedures Procedures    Medications Ordered in ED Medications  ipratropium-albuterol (DUONEB) 0.5-2.5 (3) MG/3ML nebulizer solution 6 mL (has no administration in time range)    ED Course/ Medical Decision Making/ A&P                             Medical Decision Making Amount and/or Complexity of Data Reviewed Labs: ordered. Radiology: ordered.  Risk Prescription drug  management.    Medical Decision Making / ED Course   This patient presents to the ED for concern of dyspnea, hallucinations, this involves an extensive number of treatment options, and is a complaint that carries with it a high risk of complications and morbidity.  The differential diagnosis includes COPD exacerbation, pneumonia, ACS, psychiatric emergency  MDM: 52 year old male presents today for evaluation of shortness of breath.  He has history of COPD.  Improvement after medications received in the ambulance.  Will give additional DuoNeb's given he does still have scattered wheezing.  X-ray, blood work.  He is also requesting psych eval due to occasional suicidal ideations, and hallucinations.  Additional 2 DuoNebs given.  Reports significant improvement.  Blood work is overall reassuring.  CBC is unremarkable, CMP shows glucose of 198 otherwise without acute concerns.  Salicylate, acetaminophen, ethanol levels within normal limits.  Troponin negative.  EKG without acute ischemic changes. PACs noted.  UDS positive for cocaine.  Chest x-ray without acute cardiopulmonary process.  Patient medically cleared for psychiatric evaluation.  Lab Tests: -I ordered, reviewed, and interpreted labs.   The pertinent results include:   Labs Reviewed  ACETAMINOPHEN LEVEL - Abnormal; Notable for the following components:      Result Value   Acetaminophen (Tylenol), Serum <10 (*)    All other components within normal limits  COMPREHENSIVE METABOLIC PANEL - Abnormal; Notable for the following components:   Glucose, Bld 198 (*)    BUN 21 (*)    All other components within normal limits  RAPID URINE DRUG SCREEN, HOSP PERFORMED - Abnormal; Notable for the following components:   Cocaine POSITIVE (*)    All other components within normal limits  SALICYLATE LEVEL - Abnormal; Notable for the following components:   Salicylate Lvl <7.0 (*)    All other components within normal limits  CBC WITH  DIFFERENTIAL/PLATELET  ETHANOL  TROPONIN I (HIGH SENSITIVITY)  TROPONIN I (HIGH SENSITIVITY)      EKG  EKG Interpretation  Date/Time:    Ventricular Rate:    PR Interval:    QRS Duration:   QT Interval:    QTC Calculation:   R Axis:     Text Interpretation:           Imaging Studies ordered: I ordered imaging studies including cxr I independently visualized and interpreted imaging. I agree with the radiologist interpretation   Medicines ordered and prescription drug management: Meds ordered this encounter  Medications   ipratropium-albuterol (DUONEB) 0.5-2.5 (3) MG/3ML nebulizer solution 6 mL   OR Linked Order Group    LORazepam (ATIVAN) injection 0-4 mg     Order Specific Question:   CIWA-AR < 5 =     Answer:   0 mg     Order Specific Question:   CIWA-AR 5 -10 =     Answer:   1 mg     Order Specific Question:   CIWA-AR 11 -15 =     Answer:   2 mg     Order Specific Question:   CIWA-AR 16 -20 =     Answer:   3 mg     Order Specific Question:   CIWA-AR 16 -20 =     Answer:   Recheck CIWA-AR in 1 hour; if > 20 notify MD     Order Specific Question:   CIWA-AR > 20 =     Answer:   4 mg     Order Specific Question:   CIWA-AR > 20 =     Answer:   Notify MD    LORazepam (ATIVAN) tablet 0-4 mg     Order Specific Question:   CIWA-AR < 5 =     Answer:   0 mg     Order Specific Question:   CIWA-AR 5 -10 =     Answer:   1 mg     Order Specific Question:   CIWA-AR 11 -15 =     Answer:   2 mg     Order Specific Question:   CIWA-AR 16 -20 =     Answer:   3 mg     Order Specific Question:  CIWA-AR 16 -20 =     Answer:   Recheck CIWA-AR in 1 hour; if > 20 notify MD     Order Specific Question:   CIWA-AR > 20 =     Answer:   4 mg     Order Specific Question:   CIWA-AR > 20 =     Answer:   Notify MD   OR Linked Order Group    LORazepam (ATIVAN) injection 0-4 mg     Order Specific Question:   CIWA-AR < 5 =     Answer:   0 mg     Order Specific Question:   CIWA-AR 5  -10 =     Answer:   1 mg     Order Specific Question:   CIWA-AR 11 -15 =     Answer:   2 mg     Order Specific Question:   CIWA-AR 16 -20 =     Answer:   3 mg     Order Specific Question:   CIWA-AR 16 -20 =     Answer:   Recheck CIWA-AR in 1 hour; if > 20 notify MD     Order Specific Question:   CIWA-AR > 20 =     Answer:   4 mg     Order Specific Question:   CIWA-AR > 20 =     Answer:   Notify MD    LORazepam (ATIVAN) tablet 0-4 mg     Order Specific Question:   CIWA-AR < 5 =     Answer:   0 mg     Order Specific Question:   CIWA-AR 5 -10 =     Answer:   1 mg     Order Specific Question:   CIWA-AR 11 -15 =     Answer:   2 mg     Order Specific Question:   CIWA-AR 16 -20 =     Answer:   3 mg     Order Specific Question:   CIWA-AR 16 -20 =     Answer:   Recheck CIWA-AR in 1 hour; if > 20 notify MD     Order Specific Question:   CIWA-AR > 20 =     Answer:   4 mg     Order Specific Question:   CIWA-AR > 20 =     Answer:   Notify MD    -I have reviewed the patients home medicines and have made adjustments as needed   Reevaluation: After the interventions noted above, I reevaluated the patient and found that they have :improved  Co morbidities that complicate the patient evaluation  Past Medical History:  Diagnosis Date   Anxiety    Bipolar 1 disorder (HCC)    COPD (chronic obstructive pulmonary disease) (HCC)    Depression    No pertinent past medical history    Schizophrenia (HCC)    Substance abuse (HCC)       Dispostion: Patient signed out to default provider for psychiatric evaluation.   Final Clinical Impression(s) / ED Diagnoses Final diagnoses:  COPD exacerbation (HCC)  Hallucinations    Rx / DC Orders ED Discharge Orders     None         Marita [place], PA-C 05/05/22 2259    Elayne Snare K, DO 05/05/22 2340

## 2022-05-05 NOTE — ED Triage Notes (Signed)
Pt BIB EMS for SOB that started about 45 minutes ago. Per pt, he has been out of his inhaler. Pt also endorses hearing voices. Pt arrived on CPAP and received 50mg  of albuterol, 05 of atrovent, 2mg  of mag, and 125mg  of solumedrol enroute. Pt has hx of COPD.

## 2022-05-05 NOTE — ED Notes (Signed)
Blood redrawn due to lab stating they did not have it when this nurse called to check on it. JRPRN

## 2022-05-05 NOTE — ED Notes (Signed)
Pt. In burgundy scrubs and wanded by security. Pt has 1 belongings bag. Pt belongings locked up in cabinet 19-22 on the RES A and B side behind the nurses station. Pt. Has 1 blue jean pant, 1 black belt, 1 pr socks, 1 cell phone, 1 black jacket, and 1 gray shirt.

## 2022-05-06 ENCOUNTER — Other Ambulatory Visit: Payer: Self-pay

## 2022-05-06 DIAGNOSIS — F333 Major depressive disorder, recurrent, severe with psychotic symptoms: Secondary | ICD-10-CM

## 2022-05-06 LAB — TROPONIN I (HIGH SENSITIVITY): Troponin I (High Sensitivity): 4 ng/L (ref <18)

## 2022-05-06 LAB — SARS CORONAVIRUS 2 BY RT PCR: SARS Coronavirus 2 by RT PCR: NEGATIVE

## 2022-05-06 NOTE — BH Assessment (Signed)
Secure message sent to pt's RN re: TTS consult

## 2022-05-06 NOTE — Progress Notes (Signed)
At 9:58pm this CSW communicated with Night [facility] AC Ervin Knack) Hart Rochester, RN requested that pt be reviewed for [facility] observation per request from psych provider Alona Bene, PMHNP as listed in pt's "Medical Decision Making: Recommend [facility] for continuous observation, until collateral information is obtained. Home medication restarted. Once collateral information is obtained, patient can be discharged, patient wants to walk-in to [facility] for medication management."  Night [facility] AC Ervin Knack) Hart Rochester, RN informed CSW and care team that there is currently no availability within the [facility] observation area at this time, however Night [facility] AC Ervin Knack) Hart Rochester, RN reported that she would re-assess availability throughout shift and update care team.   Care Team notified:Night [facility] Ervin Knack) [place], RN, Night [facility] Fransico Michael, RN, Sindy Guadeloupe, NP, and [facility], PMHNP   Maryjean Ka, MSW, [facility] 05/06/2022 11:32 PM

## 2022-05-06 NOTE — ED Notes (Signed)
Per Loree Fee RN dayshift pt is to go to [facility] in the AM hopefully 0700. Will call safe transport at 0600.

## 2022-05-06 NOTE — Consult Note (Addendum)
[facility] ED ASSESSMENT    Reason for Consult:  Psych Consult Referring Physician: Marita Kansas, PA-C Patient Identification: Franklin [name] MRN:  [id number] ED Chief Complaint: Major depressive disorder, recurrent episode, severe, with psychotic behavior (HCC)  Diagnosis:  Principal Problem:   Major depressive disorder, recurrent episode, severe, with psychotic behavior (HCC) Active Problems:   Substance induced mood disorder [facility])   ED Assessment Time Calculation: Start Time: 1300 Stop Time: 1340 Total Time in Minutes (Assessment Completion): 40   HPI: Per Triage Note: Pt BIB EMS for SOB that started about 45 minutes ago. Per pt, he has been out of his inhaler. Pt also endorses hearing voices. Pt arrived on CPAP and received 50mg  of albuterol, 05 of atrovent, 2mg  of mag, and 125mg  of solumedrol enroute. Pt has hx of COPD.    Subjective: Franklin [name], 52 y.o., male patient seen face to face by this provider, consulted with Dr. Lucianne Muss; and chart reviewed on 05/06/22.  On evaluation Franklin [name] reports that he has a history of major depressive mood disorder, and sometimes he hears voices to harm himself, he states currently he still hears voices but not telling him to harm himself. Does report auditory hallucinations however, he reports voice are chronic in nature and " intensified" with my cocaine use". Denied that voices are command in nature.  Patient denies SI/HI/VH, states that he has been off his medications for about 6 months, except when he was admitted into hospitals, he will get them for the day but when he left he would is not able to afford them.  Patient endorses using cocaine occasionally and alcohol 12 pack of beer 3 times a week.  Patient UDS positive for cocaine and BAL less than 10.  States he was followed by [facility] where they prescribed his medications, but he does no go there anymore.   During evaluation [name] is siting in no acute distress. he is  alert/oriented x 4; calm/cooperative; and mood congruent with affect. He is speaking in a clear tone at moderate volume, and normal pace; with good eye contact.  His thought process is coherent and relevant; There is no indication that he is currently responding to internal/external stimuli or experiencing delusional thought content; and he has denied suicidal/self-harm/homicidal ideation, psychosis, and paranoia. Patient has been  calm and appropriate throughout assessment.    Patient gave permission to speak with his sister Hazael Olveda [phone], for collateral information, no answer.   Past Psychiatric History: major depressive disorder with psychotic features, substance-induced mood disorder, generalized anxiety disorder, schizophrenia    Risk to Self or Others: Risk to Self: No Risk to Others:  No  Prior Inpatient Therapy:  Yes Prior Outpatient Therapy: Yes   [place] Scale:  Flowsheet Row ED from 05/05/2022 in [facility] Emergency Department at [facility] ED from 04[facility]27/2022 in [facility] Emergency Department at [facility] ED from 04/22/2022 in [facility] Emergency Department at [facility]  C-SSRS RISK CATEGORY No Risk No Risk No Risk       AIMS:  , , ,  ,   ASAM:    Substance Abuse:     Past Medical History:  Past Medical History:  Diagnosis Date   Anxiety    Bipolar 1 disorder (HCC)    COPD (chronic obstructive pulmonary disease) (HCC)    Depression    No pertinent past medical history    Schizophrenia (HCC)    Substance abuse (HCC)  Past Surgical History:  Procedure Laterality Date   NO PAST SURGERIES     Family History:  Family History  Problem Relation Age of Onset   Hypertension Other    Diabetes Other     Social History:  Social History   Substance and Sexual Activity  Alcohol Use Yes   Alcohol/week: 12.0 standard drinks of alcohol   Types: 12 Cans of beer per week   Comment: He drinks one 12 pack of beer per day. There  is a long history of alcohol use.     Social History   Substance and Sexual Activity  Drug Use Yes   Types: "Crack" cocaine, Cocaine   Comment: one gram daily    Social History   Socioeconomic History   Marital status: Single    Spouse name: Not on file   Number of children: Not on file   Years of education: Not on file   Highest education level: Not on file  Occupational History   Occupation: [employer]  Tobacco Use   Smoking status: Every Day    Packs/day: .5    Types: Cigarettes   Smokeless tobacco: Never   Tobacco comments:    Patient refused  Vaping Use   Vaping Use: Never used  Substance and Sexual Activity   Alcohol use: Yes    Alcohol/week: 12.0 standard drinks of alcohol    Types: 12 Cans of beer per week    Comment: He drinks one 12 pack of beer per day. There is a long history of alcohol use.   Drug use: Yes    Types: "Crack" cocaine, Cocaine    Comment: one gram daily   Sexual activity: Yes  Other Topics Concern   Not on file  Social History Narrative   The patient was born and raised in with that by both his biological parents. His father's past way but his mother still living. He denies any history of any physical or sexual abuse. He says he completed 2 years of college at [employer]. 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 He has never been married but has a 66 year old son who lives with his mother. He says he does get to see his son and has a relationship with him. He is not currently dating or in a relationship.      The patient does have a history of a DUI and had a court date last Friday for a DUI. He denies any other pending charges.            Social Determinants of Health   Financial Resource Strain: Not on file  Food Insecurity: Not on file  Transportation Needs: Not on file  Physical Activity: Not on file  Stress: Not on file  Social Connections: Not on file       Allergies:  No Known Allergies  Labs:  Results for orders placed or performed during the hospital encounter of 05/05/22 (from the past 48 hour(s))  Rapid urine drug screen (hospital performed)     Status: Abnormal   Collection Time: 05/05/22  7:41 PM  Result Value Ref Range   Opiates NONE DETECTED NONE DETECTED   Cocaine POSITIVE (A) NONE DETECTED   Benzodiazepines NONE DETECTED NONE DETECTED   Amphetamines NONE DETECTED NONE DETECTED   Tetrahydrocannabinol NONE DETECTED NONE DETECTED   Barbiturates NONE DETECTED NONE DETECTED    Comment: (NOTE) DRUG SCREEN FOR MEDICAL PURPOSES  ONLY.  IF CONFIRMATION IS NEEDED FOR ANY PURPOSE, NOTIFY LAB WITHIN 5 DAYS.  LOWEST DETECTABLE LIMITS FOR URINE DRUG SCREEN Drug Class                     Cutoff (ng/mL) Amphetamine and metabolites    1000 Barbiturate and metabolites    200 Benzodiazepine                 200 Opiates and metabolites        300 Cocaine and metabolites        300 THC                            50 Performed at [facility], [address] [place], [place] [address]   Acetaminophen level     Status: Abnormal   Collection Time: 05/05/22  9:30 PM  Result Value Ref Range   Acetaminophen (Tylenol), Serum <10 (L) 10 - 30 ug/mL    Comment: (NOTE) Therapeutic concentrations vary significantly. A range of 10-30 ug/mL  may be an effective concentration for many patients. However, some  are best treated at concentrations outside of this range. Acetaminophen concentrations >150 ug/mL at 4 hours after ingestion  and >50 ug/mL at 12 hours after ingestion are often associated with  toxic reactions.  Performed at [facility], [address] [place], [place] [address]   CBC with Differential/Platelet     Status: None   Collection Time: 05/05/22  9:30 PM  Result Value Ref Range   WBC 8.8 4.0 - 10.5 K/uL   RBC 4.48 4.22 - 5.81 MIL/uL   Hemoglobin 13.5 13.0 - 17.0 g/dL   HCT 09.8 11.9 -  14.7 %   MCV 92.4 80.0 - 100.0 fL   MCH 30.1 26.0 - 34.0 pg   MCHC 32.6 30.0 - 36.0 g/dL   RDW 82.9 56.2 - 13.0 %   Platelets 351 150 - 400 K/uL   nRBC 0.0 0.0 - 0.2 %   Neutrophils Relative % 87 %   Neutro Abs 7.7 1.7 - 7.7 K/uL   Lymphocytes Relative 9 %   Lymphs Abs 0.8 0.7 - 4.0 K/uL   Monocytes Relative 1 %   Monocytes Absolute 0.1 0.1 - 1.0 K/uL   Eosinophils Relative 2 %   Eosinophils Absolute 0.2 0.0 - 0.5 K/uL   Basophils Relative 1 %   Basophils Absolute 0.0 0.0 - 0.1 K/uL   Immature Granulocytes 0 %   Abs Immature Granulocytes 0.02 0.00 - 0.07 K/uL    Comment: Performed at [facility], [address] [place], [place] [address]  Comprehensive metabolic panel     Status: Abnormal   Collection Time: 05/05/22  9:30 PM  Result Value Ref Range   Sodium 139 135 - 145 mmol/L   Potassium 4.3 3.5 - 5.1 mmol/L   Chloride 105 98 - 111 mmol/L   CO2 23 22 - 32 mmol/L   Glucose, Bld 198 (H) 70 - 99 mg/dL    Comment: Glucose reference range applies only to samples taken after fasting for at least 8 hours.   BUN 21 (H) 6 - 20 mg/dL   Creatinine, Ser 4.69 0.61 - 1.24 mg/dL   Calcium 9.0 8.9 - 62.9 mg/dL   Total Protein 6.8 6.5 - 8.1 g/dL   Albumin 3.5 3.5 - 5.0 g/dL   AST 24 15 - 41 U/L  ALT 19 0 - 44 U/L   Alkaline Phosphatase 108 38 - 126 U/L   Total Bilirubin 0.4 0.3 - 1.2 mg/dL   GFR, Estimated >16 >10 mL/min    Comment: (NOTE) Calculated using the CKD-EPI Creatinine Equation (2021)    Anion gap 11 5 - 15    Comment: Performed at [facility], [address], [place], [place] [address]  Ethanol     Status: None   Collection Time: 05/05/22  9:30 PM  Result Value Ref Range   Alcohol, Ethyl (B) <10 <10 mg/dL    Comment: (NOTE) Lowest detectable limit for serum alcohol is 10 mg/dL.  For medical purposes only. Performed at [facility], [address], [place], [place] [address]   Salicylate level     Status:  Abnormal   Collection Time: 05/05/22  9:30 PM  Result Value Ref Range   Salicylate Lvl <7.0 (L) 7.0 - 30.0 mg/dL    Comment: Performed at [facility], [address], [place], [place] [address]  Troponin I (High Sensitivity)     Status: None   Collection Time: 05/05/22  9:30 PM  Result Value Ref Range   Troponin I (High Sensitivity) 5 <18 ng/L    Comment: (NOTE) Elevated high sensitivity troponin I (hsTnI) values and significant  changes across serial measurements may suggest ACS but many other  chronic and acute conditions are known to elevate hsTnI results.  Refer to the "Links" section for chest pain algorithms and additional  guidance. Performed at [facility], [address], [place], [place] [address]   Troponin I (High Sensitivity)     Status: None   Collection Time: 05/06/22 12:28 AM  Result Value Ref Range   Troponin I (High Sensitivity) 4 <18 ng/L    Comment: (NOTE) Elevated high sensitivity troponin I (hsTnI) values and significant  changes across serial measurements may suggest ACS but many other  chronic and acute conditions are known to elevate hsTnI results.  Refer to the "Links" section for chest pain algorithms and additional  guidance. Performed at [facility], [address] [place], [place] [address]   SARS Coronavirus 2 by RT PCR (hospital order, performed in [facility] hospital lab) *cepheid single result test* Anterior Nasal Swab     Status: None   Collection Time: 05/06/22 11:20 AM   Specimen: Anterior Nasal Swab  Result Value Ref Range   SARS Coronavirus 2 by RT PCR NEGATIVE NEGATIVE    Comment: (NOTE) SARS-CoV-2 target nucleic acids are NOT DETECTED.  The SARS-CoV-2 RNA is generally detectable in upper and lower respiratory specimens during the acute phase of infection. The lowest concentration of SARS-CoV-2 viral copies this assay can detect is 250 copies / mL. A negative result does not  preclude SARS-CoV-2 infection and should not be used as the sole basis for treatment or other patient management decisions.  A negative result may occur with improper specimen collection / handling, submission of specimen other than nasopharyngeal swab, presence of viral mutation(s) within the areas targeted by this assay, and inadequate number of viral copies (<250 copies / mL). A negative result must be combined with clinical observations, patient history, and epidemiological information.  Fact Sheet for Patients:   RoadLapTop.co.za  Fact Sheet for Healthcare Providers: http://kim-miller.com/  This test is not yet approved or  cleared by the [place] FDA and has been authorized for detection and/or diagnosis of SARS-CoV-2 by FDA under an  Emergency Use Authorization (EUA).  This EUA will remain in effect (meaning this test can be used) for the duration of the COVID-19 declaration under Section 564(b)(1) of the Act, 21 U.S.C. section 360bbb-3(b)(1), unless the authorization is terminated or revoked sooner.  Performed at [facility], [address], [place], [place] [address]     Current Facility-Administered Medications  Medication Dose Route Frequency Provider Last Rate Last Admin   ipratropium-albuterol (DUONEB) 0.5-2.5 (3) MG/3ML nebulizer solution 3 mL  3 mL Nebulization Q4H PRN Marita Kansas, PA-C   3 mL at 05/06/22 0508   LORazepam (ATIVAN) injection 0-4 mg  0-4 mg Intravenous Q6H Ali, Amjad, PA-C       Or   LORazepam (ATIVAN) tablet 0-4 mg  0-4 mg Oral Q6H Ali, Amjad, PA-C   1 mg at 05/06/22 1221   [START ON 05/08/2022] LORazepam (ATIVAN) injection 0-4 mg  0-4 mg Intravenous Q12H Marita Kansas, PA-C       Or   [START ON 05/08/2022] LORazepam (ATIVAN) tablet 0-4 mg  0-4 mg Oral Q12H Marita Kansas, PA-C       Current Outpatient Medications  Medication Sig Dispense Refill   albuterol (VENTOLIN HFA) 108 (90 Base)  MCG/ACT inhaler Inhale 2 puffs into the lungs every 4 (four) hours as needed for wheezing or shortness of breath. (Patient not taking: Reported on 05/05/2022) 1 each 0   cephALEXin (KEFLEX) 500 MG capsule Take 1 capsule (500 mg total) by mouth 2 (two) times daily. (Patient not taking: Reported on 05/05/2022) 14 capsule 0   chlordiazePOXIDE (LIBRIUM) 25 MG capsule 50mg  PO TID x 1D, then 25-50mg  PO BID X 1D, then 25-50mg  PO QD X 1D (Patient not taking: Reported on 04/27/2022) 10 capsule 0   gabapentin (NEURONTIN) 100 MG capsule Take 2 capsules (200 mg total) by mouth 3 (three) times daily. (Patient not taking: Reported on 04/27/2022) 126 capsule 0   hydrOXYzine (ATARAX/VISTARIL) 25 MG tablet Take 1 tablet (25 mg total) by mouth every 6 (six) hours as needed for anxiety. (Patient not taking: Reported on 04/27/2022) 84 tablet 0   naproxen (NAPROSYN) 500 MG tablet Take 1 tablet (500 mg total) by mouth 2 (two) times daily. (Patient not taking: Reported on 04/27/2022) 20 tablet 0   pantoprazole (PROTONIX) 20 MG tablet Take 1 tablet (20 mg total) by mouth daily. (Patient not taking: Reported on 04/27/2022) 21 tablet 0   predniSONE (DELTASONE) 20 MG tablet Take 3 tablets (60 mg total) by mouth daily. (Patient not taking: Reported on 04/27/2022) 15 tablet 0   predniSONE (DELTASONE) 50 MG tablet Take 1 pill daily for the next 5 days (Patient not taking: Reported on 05/05/2022) 5 tablet 0   QUEtiapine (SEROQUEL) 100 MG tablet Take 1 tablet (100 mg total) by mouth at bedtime. (Patient not taking: Reported on 10/13/2016) 21 tablet 0   sertraline (ZOLOFT) 50 MG tablet Take 1 tablet (50 mg total) by mouth daily. (Patient not taking: Reported on 04/27/2022) 21 tablet 0    Musculoskeletal: Strength & Muscle Tone: within normal limits Gait & Station: normal Patient leans: N/A   Psychiatric Specialty Exam: Presentation  General Appearance:  Appropriate for Environment  Eye Contact: Good  Speech: Clear and  Coherent  Speech Volume: Normal  Handedness: Right   Mood and Affect  Mood: Euthymic  Affect: Congruent   Thought Process  Thought Processes: Coherent  Descriptions of Associations:Intact  Orientation:Full (Time, Place and Person)  Thought Content:WDL  History of Schizophrenia/Schizoaffective disorder:No data  recorded Duration of Psychotic Symptoms:No data recorded Hallucinations:Hallucinations: None  Ideas of Reference:None  Suicidal Thoughts:Suicidal Thoughts: No  Homicidal Thoughts:Homicidal Thoughts: No   Sensorium  Memory: Immediate Good; Recent Good  Judgment: Fair  Insight: Good   Executive Functions  Concentration: Fair  Attention Span: Fair  Recall: Fair  Fund of Knowledge: Fair  Language: Good   Psychomotor Activity  Psychomotor Activity: Psychomotor Activity: Normal   Assets  Assets: Communication Skills; Social Support    Sleep  Sleep: Sleep: Fair   Physical Exam: Physical Exam Vitals and nursing note reviewed.  Neurological:     Mental Status: He is alert.  Psychiatric:        Attention and Perception: Attention normal.        Mood and Affect: Mood normal.        Speech: Speech normal.        Behavior: Behavior is cooperative.        Cognition and Memory: Memory normal.        Judgment: Judgment is inappropriate.    Review of Systems  Constitutional: Negative.   HENT: Negative.    Psychiatric/Behavioral:  Positive for hallucinations.    Blood pressure 139/82, pulse 66, temperature 98.1 F (36.7 C), temperature source Oral, resp. rate 18, height 5\' 10"  (1.778 m), weight 72.6 kg, SpO2 99 %. Body mass index is 22.96 kg/m.   Medical Decision Making: Recommend [facility] for continuous observation, until collateral information is obtained. Home medication restarted. Once collateral information is obtained, patient can be discharged, patient wants to walk-in to [facility] for medication management.    Alona Bene, PMHNP 05/06/2022 5:23 PM

## 2022-05-07 ENCOUNTER — Ambulatory Visit (HOSPITAL_COMMUNITY)
Admission: EM | Admit: 2022-05-07 | Discharge: 2022-05-07 | Disposition: A | Discharge status: Home / Self Care | Payer: No Payment, Other | Attending: Behavioral Health | Admitting: Behavioral Health

## 2022-05-07 DIAGNOSIS — R454 Irritability and anger: Secondary | ICD-10-CM | POA: Insufficient documentation

## 2022-05-07 DIAGNOSIS — R443 Hallucinations, unspecified: Secondary | ICD-10-CM | POA: Insufficient documentation

## 2022-05-07 MED ORDER — QUETIAPINE FUMARATE 50 MG PO TABS
50.0000 mg | ORAL_TABLET | Freq: Every day | ORAL | Status: DC
  Filled 2022-05-07: qty 3

## 2022-05-07 MED ORDER — QUETIAPINE FUMARATE 50 MG PO TABS: 50.0000 mg | ORAL_TABLET | Freq: Every day | ORAL | Status: DC

## 2022-05-07 NOTE — Discharge Instructions (Addendum)

## 2022-05-07 NOTE — ED Notes (Signed)
Patient discharged of unit to home and transported to [facility] per provider. Patient alert, cooperative, no s/s of distress at this time. Discharge information given to patient.  Belongings given to patient.  Patient ambulatory off unit, escorted by NT. Patient transported by [employer].

## 2022-05-07 NOTE — ED Notes (Signed)
Lying in bed appears comfortable with no signs of distress or discomfort skin appears WNL and respirations are easy.

## 2022-05-07 NOTE — Progress Notes (Signed)
   05/07/22 0812  [facility] Triage Screening (Walk-ins at [facility] only)  How Did You Hear About [place]? Self  What Is the Reason for Your Visit/Call Today? Pt presents to [facility]. Pt reports he has been without his medication and is in need of a medication refil. Pt reports passive SI, no clear plan or intent. Pt states he is frustrated because he is being moved from hospital to hospital without receiving his medication. Pt states he has difficulty following up with outpatient because he does not have transportation and he lives in [place]. Pt reports chronic auditory hallucinations when he is without medication.Pt denies HI and AVH currently/  How Long Has This Been Causing You Problems? > than 6 months  Have You Recently Had Any Thoughts About Hurting Yourself? Yes  How long ago did you have thoughts about hurting yourself? passive SI today  Are You Planning to Commit Suicide/Harm Yourself At This time? No  Have you Recently Had Thoughts About Hurting Someone [name]? No  Are You Planning To Harm Someone At This Time? No  Are you currently experiencing any auditory, visual or other hallucinations? No  Have You Used Any Alcohol or Drugs in the Past 24 Hours? No  Do you have any current medical co-morbidities that require immediate attention? No  Clinician description of patient physical appearance/behavior: anxious, guarded  What Do You Feel Would Help You the Most Today? Medication(s);Treatment for Depression or other mood problem  If access to [facility] was not available, would you have sought care in the Emergency Department? No  Determination of Need Routine (7 days)  Options For Referral Outpatient Therapy;Medication Management

## 2022-05-07 NOTE — ED Provider Notes (Signed)
Patient being taken by safe transport to [facility] at this time.  Stable for transfer.   Terald Sleeper, MD 05/07/22 [phone]

## 2022-05-07 NOTE — ED Provider Notes (Signed)
Behavioral Health Urgent Care Medical Screening Exam  Patient Name: Franklin [name] MRN: [id number] Date of Evaluation: 05/07/22 Chief Complaint:  Hallucinations  Diagnosis:  Final diagnoses:  Hallucinations    History of Present illness: Franklin [name] is a 52 y.o. male patient with a past psychiatric history significant for polysubstance abuse, cocaine use disorder, alcohol use disorder, MDD, and schizophrenia who presented to the [facility] voluntary after he was discharged from [facility].   On evaluation, patient is alert and oriented x 4. His thought process is linear and speech is clear and coherent at a moderate tone. His mood is irritable and affect is congruent. He has fair eye contact. He is casually dressed. He does not appear to be in acute distress. He states that he was sent here from the [facility] for medications for hallucinations. When asked what has he been diagnosed with in the past, he states, "you can look in the chart and find out. You have it right in front of you." I reviewed patient's prior history and past medications with him per chart review. Patient confirms taking Seroquel in the past with effectiveness for treatment of psychosis. He states that he hasn't been on medications for a long time because he didn't think he needed to be on medications. He denies outpatient psychiatry or therapy. He states that he does not follow up with outpatient services because he does not have transportation in [place] to go the doctor. I discussed with the patient the importance of following up with outpatient psychiatry for medication management and the wellness center for medical problems. He states that he goes to the ED for his medications. I discussed with the patient applying for medicaid to assist with transportation and medical care. Patient states that he has no way to get to social services to apply for medicaid. Patient does not appear to be interested in the community resources provided to him  and is only focused on obtaining medications now for his hallucinations. Patient UDS positive for cocaine. I discussed with the patient that cocaine use can exacerbate psychosis in patient with a history of schizophrenia. Patient appears to minimize his substance use and states that he is not interested in substance abuse treatment. He states that he's been hearing voice for a long time. Patient denies VH. There is no objective evidence that the patient is currently responding to internal or external stimuli. He endorses passive SI with no plan or intent. He verbally contracts for safety. He denies access to weapons. He denies HI. He states that he lives alone in [place]. I discussed with the patient providing him with a 3 day supply of sample medications for Seroquel 50 mg po QHS x 3 doses. I discussed with the patient following up here at the [facility] outpatient clinic for medication management and therapy. Safety planning completed. Patient transported home via taxi.   Flowsheet Row ED from 05/07/2022 in [facility] ED from 05/05/2022 in [facility] Department at [facility] ED from 04/27/2022 in [facility] Emergency Department at [facility]  C-SSRS RISK CATEGORY Low Risk No Risk No Risk       Psychiatric Specialty Exam  Presentation  General Appearance:Appropriate for Environment  Eye Contact:Fair  Speech:Clear and Coherent  Speech Volume:Normal  Handedness:Right   Mood and Affect  Mood: Irritable  Affect: Congruent   Thought Process  Thought Processes: Coherent  Descriptions of Associations:Intact  Orientation:Full (Time, Place and Person)  Thought Content:Logical    Hallucinations:Auditory  Ideas of Reference:None  Suicidal Thoughts:Yes, Passive Without Plan; Without Intent  Homicidal Thoughts:No   Sensorium  Memory: Immediate Fair; Recent Fair; Remote  Fair  Judgment: Intact  Insight: Present   Executive Functions  Concentration: Fair  Attention Span: Fair  Recall: [employer] of Knowledge: Fair  Language: Fair   Psychomotor Activity  Psychomotor Activity: Normal   Assets  Assets: Desire for Improvement; Communication Skills; Housing   Sleep  Sleep: Fair  Number of hours: No data recorded  Physical Exam: Physical Exam HENT:     Nose: Nose normal.  Cardiovascular:     Rate and Rhythm: Normal rate.  Pulmonary:     Effort: Pulmonary effort is normal.  Musculoskeletal:        General: Normal range of motion.     Cervical back: Normal range of motion.  Neurological:     Mental Status: He is alert and oriented to person, place, and time.    Review of Systems  Constitutional: Negative.   HENT: Negative.    Eyes: Negative.   Respiratory:  Positive for cough.   Cardiovascular: Negative.   Gastrointestinal: Negative.   Genitourinary: Negative.   Musculoskeletal: Negative.   Neurological: Negative.   Psychiatric/Behavioral:  Positive for hallucinations.    Blood pressure (!) 143/102, pulse (!) 110, temperature 98 F (36.7 C), temperature source Oral, resp. rate 18, SpO2 98 %. There is no height or weight on file to calculate BMI. Patient is asymptomatic. Patient refused repeat b/p.   Musculoskeletal: Strength & Muscle Tone: within normal limits Gait & Station: normal Patient leans: N/A   [facility] MSE Discharge Disposition for Follow up and Recommendations: Based on my evaluation the patient does not appear to have an emergency medical condition and can be discharged with resources and follow up care in outpatient services for Medication Management, Individual Therapy, and Group Therapy Discharge recommendations:   Medications: Patient is to take medications as prescribed. The patient or patient's guardian is to contact a medical professional and/or outpatient provider to address any new side effects  that develop. The patient or the patient's guardian should update outpatient providers of any new medications and/or medication changes.   Outpatient Follow up: Please review list of outpatient resources for psychiatry and counseling. Please follow up with your primary care provider for all medical related needs.   Therapy: We recommend that patient participate in individual therapy to address mental health concerns.  Atypical antipsychotics: If you are prescribed an atypical antipsychotic, it is recommended that your height, weight, BMI, blood pressure, fasting lipid panel, and fasting blood sugar be monitored by your outpatient providers.  Safety:   The following safety precautions should be taken:   No sharp objects. This includes scissors, razors, scrapers, and putty knives.   Chemicals should be removed and locked up.   Medications should be removed and locked up.   Weapons should be removed and locked up. This includes firearms, knives and instruments that can be used to cause injury.   The patient should abstain from use of illicit substances/drugs and abuse of any medications.  If symptoms worsen or do not continue to improve or if the patient becomes actively suicidal or homicidal then it is recommended that the patient return to the closest hospital emergency department, the [facility], or call 911 for further evaluation and treatment. National Suicide Prevention Lifeline 1-800-SUICIDE or 304-822-2846.  About 988 988 offers 24/7 access to trained crisis counselors who can help people experiencing  mental health-related distress. People can call or text 988 or chat 988lifeline.org for themselves or if they are worried about a loved one who may need crisis support.     Follow-up Information     [facility].   Specialty: Urgent Care Why: Walk-in hours for open access for psychiatry are Mondays, - Fridays 8 am to 11  pm. Please arrive at 7:30 am. Open access for therapy are Mondays, -Thursdays from 7:30 am to 4:00 pm and Fridays from 12 pm to 4 pm. Please arrive at 7:30 am Contact information: [address] [place] [address] [phone]                 Layla Barter, NP 05/07/2022, 9:25 AM

## 2022-05-10 ENCOUNTER — Emergency Department (HOSPITAL_COMMUNITY): Payer: Medicaid Other

## 2022-05-10 ENCOUNTER — Emergency Department (HOSPITAL_COMMUNITY)
Admission: EM | Admit: 2022-05-10 | Discharge: 2022-05-11 | Disposition: A | Discharge status: Home / Self Care | Payer: Medicaid Other | Attending: Emergency Medicine | Admitting: Emergency Medicine

## 2022-05-10 DIAGNOSIS — F333 Major depressive disorder, recurrent, severe with psychotic symptoms: Secondary | ICD-10-CM | POA: Diagnosis present

## 2022-05-10 DIAGNOSIS — F17200 Nicotine dependence, unspecified, uncomplicated: Secondary | ICD-10-CM | POA: Diagnosis not present

## 2022-05-10 DIAGNOSIS — J449 Chronic obstructive pulmonary disease, unspecified: Secondary | ICD-10-CM | POA: Diagnosis not present

## 2022-05-10 DIAGNOSIS — F1020 Alcohol dependence, uncomplicated: Secondary | ICD-10-CM | POA: Insufficient documentation

## 2022-05-10 DIAGNOSIS — R443 Hallucinations, unspecified: Secondary | ICD-10-CM | POA: Diagnosis present

## 2022-05-10 DIAGNOSIS — Y905 Blood alcohol level of 100-119 mg/100 ml: Secondary | ICD-10-CM | POA: Diagnosis not present

## 2022-05-10 DIAGNOSIS — F1420 Cocaine dependence, uncomplicated: Secondary | ICD-10-CM | POA: Diagnosis not present

## 2022-05-10 LAB — CBC WITH DIFFERENTIAL/PLATELET
Abs Immature Granulocytes: 0.03 10*3/uL (ref 0.00–0.07)
Basophils Absolute: 0.1 10*3/uL (ref 0.0–0.1)
Basophils Relative: 1 %
Eosinophils Absolute: 0.3 10*3/uL (ref 0.0–0.5)
Eosinophils Relative: 3 %
HCT: 41.6 % (ref 39.0–52.0)
Hemoglobin: 13.7 g/dL (ref 13.0–17.0)
Immature Granulocytes: 0 %
Lymphocytes Relative: 10 %
Lymphs Abs: 1.1 10*3/uL (ref 0.7–4.0)
MCH: 30 pg (ref 26.0–34.0)
MCHC: 32.9 g/dL (ref 30.0–36.0)
MCV: 91.2 fL (ref 80.0–100.0)
Monocytes Absolute: 0.2 10*3/uL (ref 0.1–1.0)
Monocytes Relative: 2 %
Neutro Abs: 9.2 10*3/uL — ABNORMAL HIGH (ref 1.7–7.7)
Neutrophils Relative %: 84 %
Platelets: 319 10*3/uL (ref 150–400)
RBC: 4.56 MIL/uL (ref 4.22–5.81)
RDW: 14.4 % (ref 11.5–15.5)
WBC: 10.9 10*3/uL — ABNORMAL HIGH (ref 4.0–10.5)
nRBC: 0 % (ref 0.0–0.2)

## 2022-05-10 LAB — COMPREHENSIVE METABOLIC PANEL
ALT: 18 U/L (ref 0–44)
AST: 21 U/L (ref 15–41)
Albumin: 4 g/dL (ref 3.5–5.0)
Alkaline Phosphatase: 71 U/L (ref 38–126)
Anion gap: 11 (ref 5–15)
BUN: 16 mg/dL (ref 6–20)
CO2: 24 mmol/L (ref 22–32)
Calcium: 8.2 mg/dL — ABNORMAL LOW (ref 8.9–10.3)
Chloride: 103 mmol/L (ref 98–111)
Creatinine, Ser: 0.89 mg/dL (ref 0.61–1.24)
GFR, Estimated: >60 mL/min (ref >60)
Glucose, Bld: 115 mg/dL — ABNORMAL HIGH (ref 70–99)
Potassium: 3.7 mmol/L (ref 3.5–5.1)
Sodium: 138 mmol/L (ref 135–145)
Total Bilirubin: 0.6 mg/dL (ref 0.3–1.2)
Total Protein: 7.5 g/dL (ref 6.5–8.1)

## 2022-05-10 LAB — TROPONIN I (HIGH SENSITIVITY)
Troponin I (High Sensitivity): 4 ng/L (ref <18)
Troponin I (High Sensitivity): 4 ng/L (ref <18)

## 2022-05-10 LAB — RAPID URINE DRUG SCREEN, HOSP PERFORMED
Amphetamines: NOT DETECTED
Barbiturates: NOT DETECTED
Benzodiazepines: NOT DETECTED
Cocaine: POSITIVE — AB
Opiates: NOT DETECTED
Tetrahydrocannabinol: NOT DETECTED

## 2022-05-10 LAB — ETHANOL: Alcohol, Ethyl (B): 112 mg/dL — ABNORMAL HIGH (ref <10)

## 2022-05-10 MED ORDER — QUETIAPINE FUMARATE 50 MG PO TABS
50.0000 mg | ORAL_TABLET | Freq: Once | ORAL | Status: AC
  Administered 2022-05-10: 50 mg via ORAL
  Filled 2022-05-10: qty 1

## 2022-05-10 MED ORDER — FLUOXETINE HCL 10 MG PO CAPS
10.0000 mg | ORAL_CAPSULE | Freq: Every day | ORAL | Status: DC
  Administered 2022-05-10 – 2022-05-11 (×2): 10 mg via ORAL
  Filled 2022-05-10 (×2): qty 1

## 2022-05-10 MED ORDER — QUETIAPINE FUMARATE 50 MG PO TABS
50.0000 mg | ORAL_TABLET | Freq: Every day | ORAL | Status: DC
  Administered 2022-05-10: 50 mg via ORAL
  Filled 2022-05-10: qty 1

## 2022-05-10 MED ORDER — ALBUTEROL SULFATE (2.5 MG/3ML) 0.083% IN NEBU
5.0000 mg | INHALATION_SOLUTION | Freq: Once | RESPIRATORY_TRACT | Status: AC
  Administered 2022-05-10: 5 mg via RESPIRATORY_TRACT
  Filled 2022-05-10: qty 6

## 2022-05-10 MED ORDER — ALBUTEROL SULFATE HFA 108 (90 BASE) MCG/ACT IN AERS: 2.0000 | INHALATION_SPRAY | RESPIRATORY_TRACT | Status: DC | PRN

## 2022-05-10 MED ORDER — QUETIAPINE FUMARATE 25 MG PO TABS
25.0000 mg | ORAL_TABLET | Freq: Every day | ORAL | Status: DC
  Administered 2022-05-10 – 2022-05-11 (×2): 25 mg via ORAL
  Filled 2022-05-10 (×2): qty 1

## 2022-05-10 NOTE — Consult Note (Signed)
BH ED ASSESSMENT   Reason for Consult:  Psychiatry evaluation Referring Physician:  ER Physician Patient Identification: Franklin [name] MRN:  [id number] ED Chief Complaint: Major depressive disorder, recurrent episode, severe, with psychotic behavior (HCC)  Diagnosis:  Principal Problem:   Major depressive disorder, recurrent episode, severe, with psychotic behavior (HCC) Active Problems:   Cocaine use disorder, severe, dependence [facility])   ED Assessment Time Calculation: Start Time: 1447 Stop Time: 1515 Total Time in Minutes (Assessment Completion): 28   Subjective:   Franklin [name] is a 52 y.o. male patient admitted in respiratory d.distress, hx if COPD per triage note.  Brought in by EMS.  Patient also c/o Auditory hallucination.  Of note patient was discharged from [facility] on the 3rd of May for same complaint.  HPI:  Patient, AA Male with previous Psychiatry  hx of Depression, Alcohol use disorder and Cocaine use disorder/Dependence came in originally for breathing issues but informed Nursing staff of hearing voices telling him to kill himself.  Patient reports hx of Schizophrenia but added that he has not been taking medications for two years.  Patient states he is depressed due to life stressors.  Patient rates depression 10/10 with 10 being severe depression.  Patient is unemployed stating he cannot work with COPD.  He also is homeless and lives in his car.  Patient reports previous medications as Seroquel, Gabapentin and he used to receive mental healthcare at [facility].  Patient reports six hours of sleep and good appetite.  He remains passively suicidal with no plan.  He reports previous suicide attempt by hanging but rope snapped off.   UDS is positive for Cocaine. We will start Seroquel and reevaluate in am for appropriate disposition.  Past Psychiatric History: Depression, Alcohol use disorder and Cocaine use disorder/Dependence.  Off Medications for two years.  No outpatient Psychiatrist.   Received care at [facility] in the past.  Risk to Self or Others: Is the patient at risk to self? Yes Has the patient been a risk to self in the past 6 months? Yes Has the patient been a risk to self within the distant past? Yes Is the patient a risk to others? No Has the patient been a risk to others in the past 6 months? No Has the patient been a risk to others within the distant past? No  [place] Scale:  Flowsheet Row ED from 05/07/2022 in [facility] ED from 05/05/2022 in [facility] Emergency Department at [facility] ED from 04/27/2022 in [facility] Emergency Department at [facility]  C-SSRS RISK CATEGORY Low Risk No Risk No Risk       AIMS:  , , ,  ,   ASAM:    Substance Abuse:     Past Medical History:  Past Medical History:  Diagnosis Date   Anxiety    Bipolar 1 disorder (HCC)    COPD (chronic obstructive pulmonary disease) (HCC)    Depression    No pertinent past medical history    Schizophrenia (HCC)    Substance abuse (HCC)     Past Surgical History:  Procedure Laterality Date   NO PAST SURGERIES     Family History:  Family History  Problem Relation Age of Onset   Hypertension Other    Diabetes Other    Family Psychiatric  History: MOM-Dementia, Sister-Depression Social History:  Social History   Substance and Sexual Activity  Alcohol Use Yes   Alcohol/week: 12.0 standard drinks of alcohol   Types:  12 Cans of beer per week   Comment: He drinks one 12 pack of beer per day. There is a long history of alcohol use.     Social History   Substance and Sexual Activity  Drug Use Yes   Types: "Crack" cocaine, Cocaine   Comment: one gram daily    Social History   Socioeconomic History   Marital status: Single    Spouse name: Not on file   Number of children: Not on file   Years of education: Not on file   Highest education level: Not on file  Occupational History   Occupation: [employer]  Tobacco Use   Smoking  status: Every Day    Packs/day: .5    Types: Cigarettes   Smokeless tobacco: Never   Tobacco comments:    Patient refused  Vaping Use   Vaping Use: Never used  Substance and Sexual Activity   Alcohol use: Yes    Alcohol/week: 12.0 standard drinks of alcohol    Types: 12 Cans of beer per week    Comment: He drinks one 12 pack of beer per day. There is a long history of alcohol use.   Drug use: Yes    Types: "Crack" cocaine, Cocaine    Comment: one gram daily   Sexual activity: Yes  Other Topics Concern   Not on file  Social History Narrative   The patient was born and raised in with that by both his biological parents. His father's past way but his mother still living. He denies any history of any physical or sexual abuse. He says he completed 2 years of college at [employer]. 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 He has never been married but has a 8 year old son who lives with his mother. He says he does get to see his son and has a relationship with him. He is not currently dating or in a relationship.      The patient does have a history of a DUI and had a court date last Friday for a DUI. He denies any other pending charges.            Social Determinants of Health   Financial Resource Strain: Not on file  Food Insecurity: Not on file  Transportation Needs: Not on file  Physical Activity: Not on file  Stress: Not on file  Social Connections: Not on file   Additional Social History:    Allergies:  No Known Allergies  Labs:  Results for orders placed or performed during the hospital encounter of 05/10/22 (from the past 48 hour(s))  Comprehensive metabolic panel     Status: Abnormal   Collection Time: 05/10/22  3:02 AM  Result Value Ref Range   Sodium 138 135 - 145 mmol/L   Potassium 3.7 3.5 - 5.1 mmol/L   Chloride 103 98 - 111 mmol/L   CO2 24 22 - 32 mmol/L   Glucose, Bld 115 (H) 70  - 99 mg/dL    Comment: Glucose reference range applies only to samples taken after fasting for at least 8 hours.   BUN 16 6 - 20 mg/dL   Creatinine, Ser 1.61 0.61 - 1.24 mg/dL   Calcium 8.2 (L) 8.9 - 10.3 mg/dL   Total Protein 7.5 6.5 - 8.1 g/dL   Albumin 4.0 3.5 - 5.0 g/dL   AST 21 15 - 41 U/L   ALT 18 0 -  44 U/L   Alkaline Phosphatase 71 38 - 126 U/L   Total Bilirubin 0.6 0.3 - 1.2 mg/dL   GFR, Estimated >16 >10 mL/min    Comment: (NOTE) Calculated using the CKD-EPI Creatinine Equation (2021)    Anion gap 11 5 - 15    Comment: Performed at [facility], [address] [place], [place] [address]  CBC with Differential     Status: Abnormal   Collection Time: 05/10/22  3:02 AM  Result Value Ref Range   WBC 10.9 (H) 4.0 - 10.5 K/uL   RBC 4.56 4.22 - 5.81 MIL/uL   Hemoglobin 13.7 13.0 - 17.0 g/dL   HCT 40.9 81.1 - 91.4 %   MCV 91.2 80.0 - 100.0 fL   MCH 30.0 26.0 - 34.0 pg   MCHC 32.9 30.0 - 36.0 g/dL   RDW 78.2 95.6 - 21.3 %   Platelets 319 150 - 400 K/uL   nRBC 0.0 0.0 - 0.2 %   Neutrophils Relative % 84 %   Neutro Abs 9.2 (H) 1.7 - 7.7 K/uL   Lymphocytes Relative 10 %   Lymphs Abs 1.1 0.7 - 4.0 K/uL   Monocytes Relative 2 %   Monocytes Absolute 0.2 0.1 - 1.0 K/uL   Eosinophils Relative 3 %   Eosinophils Absolute 0.3 0.0 - 0.5 K/uL   Basophils Relative 1 %   Basophils Absolute 0.1 0.0 - 0.1 K/uL   Immature Granulocytes 0 %   Abs Immature Granulocytes 0.03 0.00 - 0.07 K/uL    Comment: Performed at [facility], [address] [place], [place] [address]  Troponin I (High Sensitivity)     Status: None   Collection Time: 05/10/22  3:02 AM  Result Value Ref Range   Troponin I (High Sensitivity) 4 <18 ng/L    Comment: (NOTE) Elevated high sensitivity troponin I (hsTnI) values and significant  changes across serial measurements may suggest ACS but many other  chronic and acute conditions are known to elevate hsTnI results.  Refer to  the "Links" section for chest pain algorithms and additional  guidance. Performed at [facility], [address] [place], [place] [address]   Ethanol     Status: Abnormal   Collection Time: 05/10/22  3:02 AM  Result Value Ref Range   Alcohol, Ethyl (B) 112 (H) <10 mg/dL    Comment: (NOTE) Lowest detectable limit for serum alcohol is 10 mg/dL.  For medical purposes only. Performed at [facility], [address], [place], [place] [address]   Troponin I (High Sensitivity)     Status: None   Collection Time: 05/10/22  4:51 AM  Result Value Ref Range   Troponin I (High Sensitivity) 4 <18 ng/L    Comment: (NOTE) Elevated high sensitivity troponin I (hsTnI) values and significant  changes across serial measurements may suggest ACS but many other  chronic and acute conditions are known to elevate hsTnI results.  Refer to the "Links" section for chest pain algorithms and additional  guidance. Performed at [facility], [address], [place], [place] [address]   Urine rapid drug screen (hosp performed)     Status: Abnormal   Collection Time: 05/10/22 11:42 AM  Result Value Ref Range   Opiates NONE DETECTED NONE DETECTED   Cocaine POSITIVE (A) NONE DETECTED   Benzodiazepines NONE DETECTED NONE DETECTED   Amphetamines NONE DETECTED NONE DETECTED   Tetrahydrocannabinol NONE DETECTED NONE DETECTED   Barbiturates  NONE DETECTED NONE DETECTED    Comment: (NOTE) DRUG SCREEN FOR MEDICAL PURPOSES ONLY.  IF CONFIRMATION IS NEEDED FOR ANY PURPOSE, NOTIFY LAB WITHIN 5 DAYS.  LOWEST DETECTABLE LIMITS FOR URINE DRUG SCREEN Drug Class                     Cutoff (ng/mL) Amphetamine and metabolites    1000 Barbiturate and metabolites    200 Benzodiazepine                 200 Opiates and metabolites        300 Cocaine and metabolites        300 THC                            50 Performed at [facility], [address], [place], [place] [address]     Current Facility-Administered Medications  Medication Dose Route Frequency Provider Last Rate Last Admin   albuterol (VENTOLIN HFA) 108 (90 Base) MCG/ACT inhaler 2 puff  2 puff Inhalation Q4H PRN Tilden Fossa, MD       FLUoxetine (PROZAC) capsule 10 mg  10 mg Oral Daily Zandria Woldt C, NP       QUEtiapine (SEROQUEL) tablet 25 mg  25 mg Oral Q1500 Sheniece Ruggles C, NP       QUEtiapine (SEROQUEL) tablet 50 mg  50 mg Oral QHS Dahlia Byes C, NP       Current Outpatient Medications  Medication Sig Dispense Refill   albuterol (VENTOLIN HFA) 108 (90 Base) MCG/ACT inhaler Inhale 2 puffs into the lungs every 4 (four) hours as needed for wheezing or shortness of breath. (Patient not taking: Reported on 05/05/2022) 1 each 0   QUEtiapine (SEROQUEL) 50 MG tablet Take 1 tablet (50 mg total) by mouth at bedtime. (Patient not taking: Reported on 05/10/2022)      Musculoskeletal: Strength & Muscle Tone: within normal limits Gait & Station: normal Patient leans: Front   Psychiatric Specialty Exam: Presentation  General Appearance:  Casual  Eye Contact: Fleeting  Speech: Clear and Coherent; Normal Rate  Speech Volume: Normal  Handedness: Right   Mood and Affect  Mood: Depressed  Affect: Congruent; Depressed   Thought Process  Thought Processes: Coherent; Goal Directed; Linear  Descriptions of Associations:Intact  Orientation:Full (Time, Place and Person)  Thought Content:Logical  History of Schizophrenia/Schizoaffective disorder:No data recorded Duration of Psychotic Symptoms:No data recorded Hallucinations:Hallucinations: Auditory Description of Auditory Hallucinations: Voices telling him to kill himself  Ideas of Reference:None  Suicidal Thoughts:Suicidal Thoughts: Yes, Passive SI Active Intent and/or Plan: Without Plan  Homicidal Thoughts:Homicidal Thoughts: No   Sensorium  Memory: Immediate Good; Recent  Fair; Remote Good  Judgment: Intact  Insight: Shallow   Executive Functions  Concentration: Good  Attention Span: Good  Recall: Good  Fund of Knowledge: Good  Language: Good   Psychomotor Activity  Psychomotor Activity: Psychomotor Activity: Normal   Assets  Assets: Communication Skills; Desire for Improvement    Sleep  Sleep: Sleep: Good   Physical Exam: Physical Exam Vitals and nursing note reviewed.  HENT:     Head: Normocephalic and atraumatic.     Nose: Nose normal.  Cardiovascular:     Rate and Rhythm: Normal rate and regular rhythm.  Pulmonary:     Effort: Pulmonary effort is normal.  Musculoskeletal:        General: Normal range of motion.  Cervical back: Normal range of motion.  Skin:    General: Skin is warm.  Neurological:     General: No focal deficit present.     Mental Status: He is alert and oriented to person, place, and time.  Psychiatric:        Attention and Perception: Attention normal. He perceives auditory hallucinations.        Mood and Affect: Mood is anxious and depressed.        Speech: Speech normal.        Behavior: Behavior normal. Behavior is cooperative.        Thought Content: Thought content includes suicidal ideation.        Cognition and Memory: Cognition and memory normal.    Review of Systems  Constitutional: Negative.   HENT: Negative.    Eyes: Negative.   Respiratory:         Hx COPD  Cardiovascular: Negative.   Gastrointestinal: Negative.   Genitourinary: Negative.   Musculoskeletal: Negative.   Skin: Negative.   Neurological: Negative.   Endo/Heme/Allergies: Negative.   Psychiatric/Behavioral:  Positive for depression, hallucinations, substance abuse and suicidal ideas.    Blood pressure (!) 141/89, pulse 85, temperature 98.8 F (37.1 C), temperature source Oral, resp. rate 17, SpO2 95 %. There is no height or weight on file to calculate BMI.  Medical Decision Making: Patient is  passively suicidal with no plan.  Reports Auditory hallucination with voices telling him to kill himself.  Patient will start Seroquel and Prozac to start now.  Patient will be reevaluated in am for appropriate disposition.  Problem 1: Recurrent Major Depressive disorder, severe with Psychotic features  Problem 2: Cocaine use disorder, severe dependence  Disposition:  Monitor overnight and reevaluate in AM.  Earney Navy, NP-PMHNP-BC 05/10/2022 3:16 PM

## 2022-05-10 NOTE — ED Notes (Signed)
Patient provided with another sandwich, crackers, cheese, and water.

## 2022-05-10 NOTE — ED Notes (Signed)
Patient provided with ginger ale, crackers, cheese, and sandwich per request

## 2022-05-10 NOTE — ED Provider Notes (Signed)
[place] EMERGENCY DEPARTMENT AT [facility] Provider Note   CSN: [id number] Arrival date & time: 05/10/22  0132     History  Chief Complaint  Patient presents with   Respiratory Distress   Hallucinations    Franklin [name] is a 52 y.o. male.  The history is provided by the patient and medical records.  Franklin [name] is a 52 y.o. male who presents to the Emergency Department complaining of difficulty breathing.  He presents to the emergency department by EMS for evaluation of difficulty breathing that has been ongoing for 1 month.  He reports waxing waning chest discomfort as well.  He has associated cough productive of yellow sputum.  He does not use any medications but has previously been on albuterol.  He states that he has been out of this medication for the last month.  EMS treated him with Solu-Medrol, magnesium and DuoNeb in route.  Patient secondary complaint is hallucinations.  He complains of auditory hallucinations with voices telling him to hurt himself or someone else.  He does not currently have a plan to harm anyone.  He was recently seen for similar and [facility].  He states that he was only discharged with a 3 pills and that this is not enough to control his symptoms.  Hx/o COPD  Smokes tobacco, drinks about 12 pack daily, uses crack every other day, last use yesterday.         Home Medications Prior to Admission medications   Medication Sig Start Date End Date Taking? Authorizing Provider  albuterol (VENTOLIN HFA) 108 (90 Base) MCG/ACT inhaler Inhale 2 puffs into the lungs every 4 (four) hours as needed for wheezing or shortness of breath. Patient not taking: Reported on 05/05/2022 08/10/21   Ward, Layla Maw, DO  QUEtiapine (SEROQUEL) 50 MG tablet Take 1 tablet (50 mg total) by mouth at bedtime. Patient not taking: Reported on 05/10/2022 05/07/22   Layla Barter, NP      Allergies    Patient has no known allergies.     Review of Systems   Review of Systems  All other systems reviewed and are negative.   Physical Exam Updated Vital Signs BP 114/62   Pulse (!) 102   Temp 97.9 F (36.6 C) (Oral)   Resp 18   SpO2 95%  Physical Exam Vitals and nursing note reviewed.  Constitutional:      Appearance: He is well-developed.  HENT:     Head: Normocephalic and atraumatic.  Cardiovascular:     Rate and Rhythm: Regular rhythm. Tachycardia present.     Heart sounds: No murmur heard. Pulmonary:     Effort: Pulmonary effort is normal. No respiratory distress.     Comments: Expiratory wheezes bilaterally Abdominal:     Palpations: Abdomen is soft.     Tenderness: There is no abdominal tenderness. There is no guarding or rebound.  Musculoskeletal:        General: No swelling or tenderness.  Skin:    General: Skin is warm and dry.  Neurological:     Mental Status: He is alert and oriented to person, place, and time.  Psychiatric:     Comments: Flat mood, poor eye contact     ED Results / Procedures / Treatments   Labs (all labs ordered are listed, but only abnormal results are displayed) Labs Reviewed  COMPREHENSIVE METABOLIC PANEL - Abnormal; Notable for the following components:      Result Value  Glucose, Bld 115 (*)    Calcium 8.2 (*)    All other components within normal limits  CBC WITH DIFFERENTIAL/PLATELET - Abnormal; Notable for the following components:   WBC 10.9 (*)    Neutro Abs 9.2 (*)    All other components within normal limits  ETHANOL - Abnormal; Notable for the following components:   Alcohol, Ethyl (B) 112 (*)    All other components within normal limits  RAPID URINE DRUG SCREEN, HOSP PERFORMED  TROPONIN I (HIGH SENSITIVITY)  TROPONIN I (HIGH SENSITIVITY)    EKG None  Radiology DG Chest Port 1 View  Result Date: 05/10/2022 CLINICAL DATA:  Shortness of breath EXAM: PORTABLE CHEST 1 VIEW COMPARISON:  05/05/2022 FINDINGS: The heart size and mediastinal contours  are within normal limits. Both lungs are clear. The visualized skeletal structures are unremarkable. IMPRESSION: No active disease. Electronically Signed   By: Charlett Nose M.D.   On: 05/10/2022 02:17    Procedures Procedures    Medications Ordered in ED Medications  albuterol (VENTOLIN HFA) 108 (90 Base) MCG/ACT inhaler 2 puff (has no administration in time range)  albuterol (PROVENTIL) (2.5 MG/3ML) 0.083% nebulizer solution 5 mg (5 mg Nebulization Given 05/10/22 0258)  QUEtiapine (SEROQUEL) tablet 50 mg (50 mg Oral Given 05/10/22 0258)    ED Course/ Medical Decision Making/ A&P                             Medical Decision Making Amount and/or Complexity of Data Reviewed Labs: ordered. Radiology: ordered.  Risk Prescription drug management.  Patient with history of COPD, crack cocaine use and schizoaffective disorder here for evaluation of both difficulty breathing and hallucinations.  In terms of his respiratory status patient is breathing comfortably on evaluation with no respiratory distress.  He does have an occasional wheeze on examination was treated with albuterol with resolution of his wheezes.  Feel he is stable from respiratory standpoint for discharge with as needed albuterol at home.  Do not feel that he needs prednisone at time of discharge.  Chest x-ray is negative for acute abnormality-images personally reviewed and interpreted, agree with radiologist interpretation.  Current clinical picture is not consistent with ACS, PE, dissection, acute CHF, pneumonia.   In terms of his hallucinations, he states that he was not discharged with adequate medications from behavioral health he is concerned for persistent hallucinations and is worried that he might harm himself or others.  He does not currently have a plan.  These are auditory hallucinations.  Consulted TTS regarding his hallucinations.         Final Clinical Impression(s) / ED Diagnoses Final diagnoses:  Chronic  obstructive pulmonary disease, unspecified COPD type (HCC)  Hallucinations    Rx / DC Orders ED Discharge Orders     None         Tilden Fossa, MD 05/10/22 [phone]

## 2022-05-10 NOTE — ED Triage Notes (Signed)
Patient arrived via [facility] with complaints of respiratory distress, hx of COPD. Duoneb, solumedrol and magnesium given en route. Breathing has improved on arrival.  Also has complaints of hearing voices over the last two weeks telling him to harm himself and others. States "we need to get psych in here now"

## 2022-05-11 DIAGNOSIS — F333 Major depressive disorder, recurrent, severe with psychotic symptoms: Secondary | ICD-10-CM

## 2022-05-11 MED ORDER — FLUOXETINE HCL 10 MG PO CAPS: 10.0000 mg | ORAL_CAPSULE | Freq: Every day | ORAL | 0 refills | Status: DC

## 2022-05-11 MED ORDER — ALBUTEROL SULFATE HFA 108 (90 BASE) MCG/ACT IN AERS: 1.0000 | INHALATION_SPRAY | Freq: Four times a day (QID) | RESPIRATORY_TRACT | 6 refills | Status: DC | PRN

## 2022-05-11 MED ORDER — QUETIAPINE FUMARATE 50 MG PO TABS: 50.0000 mg | ORAL_TABLET | Freq: Every day | ORAL | 0 refills | Status: DC

## 2022-05-11 MED ORDER — QUETIAPINE FUMARATE 25 MG PO TABS: 25.0000 mg | ORAL_TABLET | Freq: Every day | ORAL | 0 refills | Status: DC

## 2022-05-11 MED ORDER — ALBUTEROL SULFATE HFA 108 (90 BASE) MCG/ACT IN AERS
2.0000 | INHALATION_SPRAY | Freq: Once | RESPIRATORY_TRACT | Status: AC
  Administered 2022-05-11: 2 via RESPIRATORY_TRACT
  Filled 2022-05-11: qty 6.7

## 2022-05-11 NOTE — ED Provider Notes (Signed)
Emergency Medicine Observation Re-evaluation Note  Franklin [name] is a 52 y.o. male, seen on rounds today.  Pt initially presented to the ED for complaints of Respiratory Distress and Hallucinations Currently, the patient is resting comfortably.  Physical Exam  BP 120/89   Pulse 72   Temp 97.8 F (36.6 C) (Oral)   Resp 18   SpO2 96%  Physical Exam General: NAD Cardiac: Regular HR Lungs: No respiratory distress   ED Course / MDM  EKG:   I have reviewed the labs performed to date as well as medications administered while in observation.    Plan  Current plan is for pending TTS evaluation for auditory hallucinations.  Vitals stable overnight, 96% on room air.   Terald Sleeper, MD 05/11/22 [phone]

## 2022-05-11 NOTE — ED Provider Notes (Signed)
Patient has been reassessed by psychiatry services and felt to be reasonably safe and stable for discharge with outpatient follow-up.  Inhalers were provided at the time of discharge.   Terald Sleeper, MD 05/11/22 [phone]

## 2022-05-11 NOTE — Care Management (Signed)
  [facility] Medication Assistance Card Name: Aldean Kilmer ID (MRN): [id number] Bin: [id number] RX Group: [id number] Discharge Date: 05/11/2022 Expiration Date: 05/19/2022                                           (must be filled within 7 days of discharge)     You have been approved to have the prescriptions written by your discharging physician filled through our [facility] (Medication Assistance Through [facility]) program. This program allows for a one-time (no refills) 34-day supply of selected medications for a low copay amount.  The copay is $3.00 per prescription. For instance, if you have one prescription, you will pay $3.00; for two prescriptions, you pay $6.00; for three prescriptions, you pay $9.00; and so on.  Only certain pharmacies are participating in this program with [facility]. You will need to select one of the pharmacies from the attached list and take your prescriptions, this letter, and your photo ID to one of the [facility] pharmacies, [facility], [facility] at [address], or [facility] [address].   We are excited that you are able to use the [facility] program to get your medications. These prescriptions must be filled within 7 days of hospital discharge or they will no longer be valid for the [facility] program. Should you have any problems with your prescriptions please contact your case management team member at [phone] for [place]/ [place].  Thank you, [facility] Care Management

## 2022-05-11 NOTE — Care Management (Addendum)
Transition of Care [facility]) - Emergency Department Mini Assessment   Patient Details  Name: Franklin [name] MRN: [id number] Date of Birth: [birth year]  Transition of [facility]) CM/SW Contact:    Lavenia Atlas, RN Phone Number: 05/11/2022, 12:51 PM   Clinical Narrative: Received secure chat from [facility] NP for medication assistance. This RNCM spoke with patient at bedside who reports he does not have any medical insurance however plans to apply for Medicaid. This RNCM encouraged patient to call [facility] and attached to AVS to schedule a follow up appt to establish care. Patient is aware he needs to speak with financial counselor for assistance with insurance eligibility. Patient qualifies for [facility] program and reports he can afford the $3 copay per medication. This RNCM provided the patient with [facility] letter. Notified NP, RN.  Notified EDP of need for albuterol, awaiting response.  Transportation at discharge: bus pass.  No additional TOC needs at this time.      ED Mini Assessment: What brought you to the Emergency Department? : complaints of respiratory distress, hx of COPD  Barriers to Discharge: No Barriers Identified  Barrier interventions: medication assistance     Interventions which prevented an admission or readmission: Medication Review    Patient Contact and Communications        ,          Patient states their goals for this hospitalization and ongoing recovery are:: to feel better CMS Medicare.gov Compare Post Acute Care list provided to:: Patient Choice offered to / list presented to : Patient  Admission diagnosis:  Resp Distress/ hearing voices Patient Active Problem List   Diagnosis Date Noted   Alcohol abuse with alcohol-induced mood disorder (HCC) 02/02/2016   Generalized anxiety disorder    Substance abuse (HCC) 03/22/2015   Tobacco use disorder 10/06/2014   Major depressive disorder, recurrent episode, severe, with psychotic behavior (HCC) 10/05/2014    Cocaine use disorder, severe, dependence (HCC) 10/02/2014   Major depressive disorder, recurrent, severe without psychotic features (HCC)    Alcohol use disorder, severe, dependence (HCC) 06/18/2014   Substance induced mood disorder (HCC) 03/14/2013   Polysubstance abuse (HCC) 04/08/2012   PCP:  Patient, No Pcp Per Pharmacy:   [facility] [address] [place] [address] Phone: [phone] Fax: [phone]

## 2022-05-11 NOTE — Discharge Summary (Signed)
[facility] Psych ED Discharge  05/11/2022 12:43 PM Franklin [name]  MRN:  [id number]  Principal Problem: Major depressive disorder, recurrent episode, severe, with psychotic behavior [facility]) Discharge Diagnoses: Principal Problem:   Major depressive disorder, recurrent episode, severe, with psychotic behavior (HCC) Active Problems:   Cocaine use disorder, severe, dependence (HCC)  Clinical Impression:  Final diagnoses:  Chronic obstructive pulmonary disease, unspecified COPD type (HCC)  Hallucinations   Subjective: Franklin [name] is a 52 y.o. male patient admitted in respiratory d.distress, hx if COPD per triage note.  Brought in by EMS.  Patient also c/o Auditory hallucination.  Of note patient was discharged from [facility] on the 3rd of May for same complaint . Today patient was seen in the room awake and watching TV.  He reports improved sleep and feeling less depressed.  He admitted that lack of Medical insurance prevents him from getting his Medications.  He also want assistance with Medications and care.  Patient and provider discussed the need to seek outpatient substance abuse in reference to using Cocaine.  Patient is interested again is worried of not having insurance.  We reviewed safety plan-call 911 or 988 for mental health crisis including but not limited to suicide ideation or thought.  Patient also advised to walk in to [facility] mental health facility for Mental health needs.  Patient verbalizes understanding.  Patient Denies SI/HI/AVH this morning.  He is going home on Seroquel and Prozac.  Patient is alert and oriented x 5 and he denies any pain or discomfort and no breathing issue this morning.  Patient is Psychiatrically cleared.  ED Assessment Time Calculation: Start Time: 1212 Stop Time: 1237 Total Time in Minutes (Assessment Completion): 25   Past Psychiatric History: see initial psychiatry evaluation note  Past Medical History:  Past Medical History:  Diagnosis Date   Anxiety     Bipolar 1 disorder (HCC)    COPD (chronic obstructive pulmonary disease) (HCC)    Depression    No pertinent past medical history    Schizophrenia (HCC)    Substance abuse (HCC)     Past Surgical History:  Procedure Laterality Date   NO PAST SURGERIES     Family History:  Family History  Problem Relation Age of Onset   Hypertension Other    Diabetes Other    Family Psychiatric  History:  see initial psychiatry evaluation note Social History:  Social History   Substance and Sexual Activity  Alcohol Use Yes   Alcohol/week: 12.0 standard drinks of alcohol   Types: 12 Cans of beer per week   Comment: He drinks one 12 pack of beer per day. There is a long history of alcohol use.     Social History   Substance and Sexual Activity  Drug Use Yes   Types: "Crack" cocaine, Cocaine   Comment: one gram daily    Social History   Socioeconomic History   Marital status: Single    Spouse name: Not on file   Number of children: Not on file   Years of education: Not on file   Highest education level: Not on file  Occupational History   Occupation: [employer]  Tobacco Use   Smoking status: Every Day    Packs/day: .5    Types: Cigarettes   Smokeless tobacco: Never   Tobacco comments:    Patient refused  Vaping Use   Vaping Use: Never used  Substance and Sexual Activity   Alcohol use: Yes    Alcohol/week: 12.0 standard drinks  of alcohol    Types: 12 Cans of beer per week    Comment: He drinks one 12 pack of beer per day. There is a long history of alcohol use.   Drug use: Yes    Types: "Crack" cocaine, Cocaine    Comment: one gram daily   Sexual activity: Yes  Other Topics Concern   Not on file  Social History Narrative   The patient was born and raised in with that by both his biological parents. His father's past way but his mother still living. He denies any history of any physical or sexual abuse. He says he completed 2 years of college at [employer]. 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 He has never been married but has a 3 year old son who lives with his mother. He says he does get to see his son and has a relationship with him. He is not currently dating or in a relationship.      The patient does have a history of a DUI and had a court date last Friday for a DUI. He denies any other pending charges.            Social Determinants of Health   Financial Resource Strain: Not on file  Food Insecurity: Not on file  Transportation Needs: Not on file  Physical Activity: Not on file  Stress: Not on file  Social Connections: Not on file    Tobacco Cessation:  N/A, patient does not currently use tobacco products  Current Medications: Current Facility-Administered Medications  Medication Dose Route Frequency Provider Last Rate Last Admin   albuterol (VENTOLIN HFA) 108 (90 Base) MCG/ACT inhaler 2 puff  2 puff Inhalation Q4H PRN Tilden Fossa, MD       FLUoxetine (PROZAC) capsule 10 mg  10 mg Oral Daily Aerabella Galasso C, NP   10 mg at 05/11/22 1202   QUEtiapine (SEROQUEL) tablet 25 mg  25 mg Oral Q1500 Dahlia Byes C, NP   25 mg at 05/10/22 1521   QUEtiapine (SEROQUEL) tablet 50 mg  50 mg Oral QHS Tavares Levinson C, NP   50 mg at 05/10/22 2300   Current Outpatient Medications  Medication Sig Dispense Refill   albuterol (VENTOLIN HFA) 108 (90 Base) MCG/ACT inhaler Inhale 2 puffs into the lungs every 4 (four) hours as needed for wheezing or shortness of breath. (Patient not taking: Reported on 05/05/2022) 1 each 0   [START ON 05/12/2022] FLUoxetine (PROZAC) 10 MG capsule Take 1 capsule (10 mg total) by mouth daily. 30 capsule 0   QUEtiapine (SEROQUEL) 25 MG tablet Take 1 tablet (25 mg total) by mouth daily in the afternoon. 30 tablet 0   QUEtiapine (SEROQUEL) 50 MG tablet Take 1 tablet (50 mg total) by mouth at bedtime. 30 tablet 0   PTA Medications: (Not in a  hospital admission)   [place] Scale:  Flowsheet Row ED from 05/07/2022 in [facility] ED from 05/05/2022 in [facility] Emergency Department at [facility] ED from 04/27/2022 in [facility] Emergency Department at [facility]  C-SSRS RISK CATEGORY Low Risk No Risk No Risk       Musculoskeletal: Strength & Muscle Tone: within normal limits Gait & Station: normal Patient leans: Front  Psychiatric Specialty Exam: Presentation  General Appearance:  Casual; Neat  Eye Contact: Good  Speech: Clear and Coherent; Normal Rate  Speech Volume: Normal  Handedness: Right   Mood and Affect  Mood: Depressed (less depresed)  Affect: Congruent; Appropriate   Thought Process  Thought Processes: Goal Directed; Coherent; Linear  Descriptions of Associations:Intact  Orientation:Full (Time, Place and Person)  Thought Content:Logical  History of Schizophrenia/Schizoaffective disorder:No data recorded Duration of Psychotic Symptoms:No data recorded Hallucinations:Hallucinations: None Description of Auditory Hallucinations: Voices telling him to kill himself  Ideas of Reference:None  Suicidal Thoughts:Suicidal Thoughts: No SI Active Intent and/or Plan: Without Plan  Homicidal Thoughts:Homicidal Thoughts: No   Sensorium  Memory: Immediate Good; Recent Good; Remote Fair  Judgment: Intact  Insight: Present   Executive Functions  Concentration: Good  Attention Span: Good  Recall: Good  Fund of Knowledge: Good  Language: Good   Psychomotor Activity  Psychomotor Activity: Psychomotor Activity: Normal   Assets  Assets: Communication Skills; Desire for Improvement; Social Support   Sleep  Sleep: Sleep: Good    Physical Exam: Physical Exam Vitals and nursing note reviewed.  Constitutional:      Appearance: Normal appearance.  HENT:     Head: Normocephalic and atraumatic.     Nose: Nose normal.   Cardiovascular:     Rate and Rhythm: Normal rate and regular rhythm.  Pulmonary:     Effort: Pulmonary effort is normal.  Musculoskeletal:        General: Normal range of motion.     Cervical back: Normal range of motion.  Skin:    General: Skin is warm and dry.  Neurological:     Mental Status: He is alert and oriented to person, place, and time.  Psychiatric:        Attention and Perception: Attention and perception normal.        Mood and Affect: Mood is depressed.        Speech: Speech normal.        Behavior: Behavior normal.        Thought Content: Thought content normal.        Cognition and Memory: Cognition and memory normal.        Judgment: Judgment normal.    Review of Systems  Constitutional: Negative.   HENT: Negative.    Eyes: Negative.   Respiratory: Negative.    Cardiovascular: Negative.   Gastrointestinal: Negative.   Genitourinary: Negative.   Musculoskeletal: Negative.   Skin: Negative.   Neurological: Negative.   Endo/Heme/Allergies: Negative.   Psychiatric/Behavioral:  Positive for substance abuse.    Blood pressure 120/89, pulse 72, temperature 97.8 F (36.6 C), temperature source Oral, resp. rate 18, SpO2 96 %. There is no height or weight on file to calculate BMI.   Demographic Factors:  Male, Adolescent or young adult, Low socioeconomic status, Living alone, and Unemployed  Loss Factors: Financial problems/change in socioeconomic status  Historical Factors: Prior suicide attempts  Risk Reduction Factors:   Positive coping skills or problem solving skills  Continued Clinical Symptoms:  Depression:   Comorbid alcohol abuse/dependence Impulsivity Alcohol/Substance Abuse/Dependencies Previous Psychiatric Diagnoses and Treatments  Cognitive Features That Contribute To Risk:  None    Suicide Risk:  Minimal: No identifiable suicidal ideation.  Patients presenting with no risk factors but with morbid ruminations; may be classified as  minimal risk based on the severity of the depressive symptoms    Plan Of Care/Follow-up recommendations:  Activity:  as tolerated Diet:  Regular  Medical Decision Making: Patient is stable at this time.  He is back on his medications and SW is trying to assist patient get his medications  at a reduced price.  Patient is Psychiatrically cleared.  Disposition:Psychiatrically cleared.  Earney Navy, NP-PMHNP-BC 05/11/2022, 12:43 PM

## 2022-05-11 NOTE — ED Notes (Signed)
Patient discharged off unit to home per provider. Patient alert, cooperative, calm, no s/s of distress. Patient denied suicidal ideation Patient denied homicidal ideation. No S/S of distress. Discharge information given to the patient with acknowledged understanding. Belongings given to the patient.  Patient ambulatory off unit, escorted by NT. Patient given bus pass for transportation.

## 2022-05-11 NOTE — Progress Notes (Addendum)
TOC consulted for MH resources. This Clinical research associate attached [employer] flyer to AVS. RNCM assisting with medications.

## 2022-05-20 ENCOUNTER — Emergency Department (HOSPITAL_COMMUNITY): Payer: Medicaid Other

## 2022-05-20 ENCOUNTER — Emergency Department (HOSPITAL_COMMUNITY)
Admission: EM | Admit: 2022-05-20 | Discharge: 2022-05-21 | Disposition: A | Discharge status: Home / Self Care | Payer: Medicaid Other | Attending: Emergency Medicine | Admitting: Emergency Medicine

## 2022-05-20 ENCOUNTER — Encounter (HOSPITAL_COMMUNITY): Payer: Self-pay

## 2022-05-20 DIAGNOSIS — F1490 Cocaine use, unspecified, uncomplicated: Secondary | ICD-10-CM | POA: Diagnosis not present

## 2022-05-20 DIAGNOSIS — Z1152 Encounter for screening for COVID-19: Secondary | ICD-10-CM | POA: Insufficient documentation

## 2022-05-20 DIAGNOSIS — F1994 Other psychoactive substance use, unspecified with psychoactive substance-induced mood disorder: Secondary | ICD-10-CM | POA: Insufficient documentation

## 2022-05-20 DIAGNOSIS — J449 Chronic obstructive pulmonary disease, unspecified: Secondary | ICD-10-CM | POA: Insufficient documentation

## 2022-05-20 DIAGNOSIS — R45851 Suicidal ideations: Secondary | ICD-10-CM | POA: Insufficient documentation

## 2022-05-20 LAB — COMPREHENSIVE METABOLIC PANEL
ALT: 19 U/L (ref 0–44)
AST: 21 U/L (ref 15–41)
Albumin: 3.4 g/dL — ABNORMAL LOW (ref 3.5–5.0)
Alkaline Phosphatase: 55 U/L (ref 38–126)
Anion gap: 9 (ref 5–15)
BUN: 14 mg/dL (ref 6–20)
CO2: 25 mmol/L (ref 22–32)
Calcium: 8.7 mg/dL — ABNORMAL LOW (ref 8.9–10.3)
Chloride: 102 mmol/L (ref 98–111)
Creatinine, Ser: 0.95 mg/dL (ref 0.61–1.24)
GFR, Estimated: >60 mL/min (ref >60)
Glucose, Bld: 128 mg/dL — ABNORMAL HIGH (ref 70–99)
Potassium: 3.5 mmol/L (ref 3.5–5.1)
Sodium: 136 mmol/L (ref 135–145)
Total Bilirubin: 0.7 mg/dL (ref 0.3–1.2)
Total Protein: 6.6 g/dL (ref 6.5–8.1)

## 2022-05-20 LAB — CBC
HCT: 37.9 % — ABNORMAL LOW (ref 39.0–52.0)
Hemoglobin: 12.6 g/dL — ABNORMAL LOW (ref 13.0–17.0)
MCH: 30.2 pg (ref 26.0–34.0)
MCHC: 33.2 g/dL (ref 30.0–36.0)
MCV: 90.9 fL (ref 80.0–100.0)
Platelets: 327 10*3/uL (ref 150–400)
RBC: 4.17 MIL/uL — ABNORMAL LOW (ref 4.22–5.81)
RDW: 14.6 % (ref 11.5–15.5)
WBC: 7.8 10*3/uL (ref 4.0–10.5)
nRBC: 0 % (ref 0.0–0.2)

## 2022-05-20 LAB — SALICYLATE LEVEL: Salicylate Lvl: <7 mg/dL — ABNORMAL LOW (ref 7.0–30.0)

## 2022-05-20 LAB — RAPID URINE DRUG SCREEN, HOSP PERFORMED
Amphetamines: NOT DETECTED
Barbiturates: NOT DETECTED
Benzodiazepines: NOT DETECTED
Cocaine: POSITIVE — AB
Opiates: NOT DETECTED
Tetrahydrocannabinol: NOT DETECTED

## 2022-05-20 LAB — ETHANOL: Alcohol, Ethyl (B): <10 mg/dL (ref <10)

## 2022-05-20 LAB — ACETAMINOPHEN LEVEL: Acetaminophen (Tylenol), Serum: <10 ug/mL — ABNORMAL LOW (ref 10–30)

## 2022-05-20 LAB — SARS CORONAVIRUS 2 BY RT PCR: SARS Coronavirus 2 by RT PCR: NEGATIVE

## 2022-05-20 MED ORDER — QUETIAPINE FUMARATE 50 MG PO TABS
50.0000 mg | ORAL_TABLET | Freq: Every day | ORAL | Status: DC
  Administered 2022-05-20: 50 mg via ORAL
  Filled 2022-05-20: qty 1

## 2022-05-20 MED ORDER — FLUOXETINE HCL 10 MG PO CAPS
10.0000 mg | ORAL_CAPSULE | Freq: Every day | ORAL | Status: DC
  Administered 2022-05-20: 10 mg via ORAL
  Filled 2022-05-20: qty 1

## 2022-05-20 MED ORDER — QUETIAPINE FUMARATE 25 MG PO TABS
25.0000 mg | ORAL_TABLET | Freq: Every day | ORAL | Status: DC
  Administered 2022-05-20: 25 mg via ORAL

## 2022-05-20 MED ORDER — ALBUTEROL SULFATE (2.5 MG/3ML) 0.083% IN NEBU
5.0000 mg | INHALATION_SOLUTION | Freq: Once | RESPIRATORY_TRACT | Status: AC
  Administered 2022-05-20: 5 mg via RESPIRATORY_TRACT
  Filled 2022-05-20: qty 6

## 2022-05-20 NOTE — ED Notes (Signed)
Pt wanded by security. 

## 2022-05-20 NOTE — ED Notes (Signed)
2 belongings bags moved to locker #[id number]

## 2022-05-20 NOTE — BH Assessment (Signed)
Comprehensive Clinical Assessment (CCA) Screening, Triage and Referral Note  05/20/2022 [name] [id number]  Disposition: Clinical report given to Sindy Guadeloupe, NP who states patient is psychiatrically cleared, with a recommendation for outpatient mental health follow-up to obtain therapy and medication management. RN Bertram Millard and Dr. Benjiman Core informed of the recommendation.  The patient demonstrates the following risk factors for suicide: Chronic risk factors for suicide include: psychiatric disorder of MDD, anxiety, bipolar and schizophrenia and substance use disorder. Acute risk factors for suicide include: social withdrawal/isolation. Protective factors for this patient include: positive social support. Considering these factors, the overall suicide risk at this point appears to be low. Patient is appropriate for outpatient follow up.  Franklin [name] is a 51 year old single male with a history of anxiety, bipolar, MDD and schizophrenia. Patient presents voluntarily to [facility] ED due to COPD, SI, and audio hallucinations. Patient reports he has been having audio hallucinations for a few years; however, it has recently worsened. Patient states the voices are telling him to hurt himself and other people. Patient says he was on Gabapentin and Seroquel, but he stopped taking it about a year ago. Patient could not provide an explanation on why he stopped taking the medications; however, he says he thinks it helped with his hallucinations. Patient does not have a suicide plan and despite the commanding voices, denies being homicidal. Patient says she has been suicidal for the past two to three days. Patient denies visual hallucinations. Patient reports he did $40 worth of cocaine today and drank a 40oz beer. Patient denies access to guns or weapons.   Patient identifies his stressors as "everything". Patient reports he has been homeless for the past six months. He states does not have  supports, however he later says he spoke with his sister, and she is going to help him pay for needed medications. Patient denies any history of abuse or trauma. Patient denies current legal problems.   Patient is currently not receiving mental health resources. Patient was prescribed Seroquel and Prozac during his ED visit on May 7, however, he states he did not follow-up through on obtaining the medication due to not having money. Patient has a diagnosis of COPD and complains of being short of breath during the assessment.   Patient is dressed in scrubs, alert and oriented x4 with normal speech. Patient is observed laying in bed and complaining of being short of breath and in pain due to his medical diagnosis. Patient makes good eye contact and has a flat affect. There is no indication he is responding to internal stimuli. Patient is cooperative throughout the assessment.    Chief Complaint:  Chief Complaint  Patient presents with   Suicidal   Homicidal   Hallucinations   Visit Diagnosis: Substance induced mood disorder  Patient Reported Information How did you hear about [place]? Self  What Is the Reason for Your Visit/Call Today? Franklin [name] is a 52 year-old single male with a history of anxiety, bipolar, MDD and schizophrenia. Patient presents voluntarily to [facility] ED due to COPD, SI, and audio hallucinations. Patient reports he has been having audio hallucinations for a few years; however, it has recently worsened. . Patient states the voices are telling him to hurt himself and other people. Patient does not have a suicide plan and denies being homicidal, despite the voices. Patient says he has been suicidal the past 2 to 3 days. Patient reports using $40 worth of cocaine and drinking a 40oz of beer  today. Patient is currently homeless. Patient denies access to guns or weapons.  How Long Has This Been Causing You Problems? <Week  What Do You Feel Would Help You the Most Today?  Treatment for Depression or other mood problem   Have You Recently Had Any Thoughts About Hurting Yourself? Yes  Are You Planning to Commit Suicide/Harm Yourself At This time? Yes   Have you Recently Had Thoughts About Hurting Someone [name]? No  Are You Planning to Harm Someone at This Time? No  Explanation: N/A   Have You Used Any Alcohol or Drugs in the Past 24 Hours? Yes  How Long Ago Did You Use Drugs or Alcohol? No data recorded What Did You Use and How Much? $40 worth of cocaine and a 40oz beer   Do You Currently Have a Therapist/Psychiatrist? No  Name of Therapist/Psychiatrist: N/A   Have You Been Recently Discharged From Any Office Practice or Programs? Yes  Explanation of Discharge From Practice/Program: [facility] on 05/11/2022    CCA Screening Triage Referral Assessment Type of Contact: Tele-Assessment  Telemedicine Service Delivery: Telemedicine service delivery: This service was provided via telemedicine using a 2-way, interactive audio and video technology  Is this Initial or Reassessment? Is this Initial or Reassessment?: Initial Assessment  Date Telepsych consult ordered in [facility]:  Date Telepsych consult ordered in [facility]: 05/20/22  Time Telepsych consult ordered in [facility]:  Time Telepsych consult ordered in [facility]: 2034  Location of Assessment: [facility] ED  Provider Location: [facility] Assessment Services    Collateral Involvement: None   Does Patient Have a Automotive engineer Guardian? No data recorded Name and Contact of Legal Guardian: No data recorded If Minor and Not Living with Parent(s), Who has Custody? N/A  Is CPS involved or ever been involved? Never  Is APS involved or ever been involved? Never   Patient Determined To Be At Risk for Harm To Self or Others Based on Review of Patient Reported Information or Presenting Complaint? Yes, for Self-Harm (denies HI)  Method: No Plan (denies HI)  Availability of Means: No access or NA (denies HI)  Intent:  Vague intent or NA (denies HI)  Notification Required: No need or identified person (denies HI)  Additional Information for Danger to Others Potential: Previous attempts (In 2012 or 2013)  Additional Comments for Danger to Others Potential: N/A  Are There Guns or Other Weapons in Your Home? No  Types of Guns/Weapons: N/A  Are These Weapons Safely Secured?                            -- (N/A)  Who Could Verify You Are Able To Have These Secured: N/A  Do You Have any Outstanding Charges, Pending Court Dates, Parole/Probation? Patient denies  Contacted To Inform of Risk of Harm To Self or Others: -- (N/A)   Does Patient Present under Involuntary Commitment? No    [place] of Residence: [place]   Patient Currently Receiving the Following Services: Not Receiving Services   Determination of Need: Routine (7 days)   Options For Referral: Medication Management; Intensive Outpatient Therapy   Discharge Disposition:     Cleda Clarks, LCSW

## 2022-05-20 NOTE — ED Notes (Signed)
Pt belongings were placed in two bags. One bag contains his shoes and jacket. Another bag contains shirt, bottoms and belt. Placed in hall C cabinet with labels

## 2022-05-20 NOTE — ED Provider Notes (Addendum)
[place] EMERGENCY DEPARTMENT AT [facility] Provider Note   CSN: [id number] Arrival date & time: 05/20/22  1745     History  Chief Complaint  Patient presents with   Suicidal   Homicidal   Hallucinations    Franklin [name] is a 52 y.o. male.  HPI Patient presents with shortness of breath and cough.  History of COPD.  Recent visits for same.  States he feels short of breath and is having difficulty walking.  No fevers.  Also states he has hearing voices.  Auditory and visual.  States they are telling him to hurt himself and other people.  States he is worried he is going to hurt himself or someone else.  Also history of substance abuse.  States he has been using cocaine.   Past Medical History:  Diagnosis Date   Anxiety    Bipolar 1 disorder (HCC)    COPD (chronic obstructive pulmonary disease) (HCC)    Depression    No pertinent past medical history    Schizophrenia (HCC)    Substance abuse (HCC)     Home Medications Prior to Admission medications   Medication Sig Start Date End Date Taking? Authorizing Provider  albuterol (VENTOLIN HFA) 108 (90 Base) MCG/ACT inhaler Inhale 2 puffs into the lungs every 4 (four) hours as needed for wheezing or shortness of breath. Patient not taking: Reported on 05/05/2022 08/10/21   Ward, Layla Maw, DO  albuterol (VENTOLIN HFA) 108 (90 Base) MCG/ACT inhaler Inhale 1-2 puffs into the lungs every 6 (six) hours as needed for wheezing or shortness of breath. 05/11/22   Terald Sleeper, MD  FLUoxetine (PROZAC) 10 MG capsule Take 1 capsule (10 mg total) by mouth daily. 05/12/22 06/11/22  Earney Navy, NP  QUEtiapine (SEROQUEL) 25 MG tablet Take 1 tablet (25 mg total) by mouth daily in the afternoon. 05/11/22 06/10/22  Earney Navy, NP  QUEtiapine (SEROQUEL) 50 MG tablet Take 1 tablet (50 mg total) by mouth at bedtime. 05/11/22 06/10/22  Earney Navy, NP      Allergies    Patient has no known allergies.    Review of Systems    Review of Systems  Physical Exam Updated Vital Signs BP (!) 141/66   Pulse (!) 107   Temp 98.7 F (37.1 C)   Resp 18   SpO2 97%  Physical Exam Vitals and nursing note reviewed.  Eyes:     Pupils: Pupils are equal, round, and reactive to light.  Cardiovascular:     Rate and Rhythm: Regular rhythm.  Pulmonary:     Comments: Mild wheezes without focal rales or rhonchi. Abdominal:     Tenderness: There is no abdominal tenderness.  Musculoskeletal:        General: No tenderness.     Cervical back: Neck supple.  Skin:    General: Skin is warm.  Neurological:     Mental Status: He is alert and oriented to person, place, and time.     ED Results / Procedures / Treatments   Labs (all labs ordered are listed, but only abnormal results are displayed) Labs Reviewed  COMPREHENSIVE METABOLIC PANEL - Abnormal; Notable for the following components:      Result Value   Glucose, Bld 128 (*)    Calcium 8.7 (*)    Albumin 3.4 (*)    All other components within normal limits  SALICYLATE LEVEL - Abnormal; Notable for the following components:   Salicylate Lvl <7.0 (*)  All other components within normal limits  ACETAMINOPHEN LEVEL - Abnormal; Notable for the following components:   Acetaminophen (Tylenol), Serum <10 (*)    All other components within normal limits  CBC - Abnormal; Notable for the following components:   RBC 4.17 (*)    Hemoglobin 12.6 (*)    HCT 37.9 (*)    All other components within normal limits  RAPID URINE DRUG SCREEN, HOSP PERFORMED - Abnormal; Notable for the following components:   Cocaine POSITIVE (*)    All other components within normal limits  SARS CORONAVIRUS 2 BY RT PCR  ETHANOL    EKG None  Radiology DG Chest 2 View  Result Date: 05/20/2022 CLINICAL DATA:  sob EXAM: CHEST - 2 VIEW COMPARISON:  May 10, 2022 FINDINGS: The cardiomediastinal silhouette is normal in contour. No pleural effusion. No pneumothorax. No acute pleuroparenchymal  abnormality. Visualized abdomen is unremarkable. No acute osseous abnormality noted. IMPRESSION: No acute cardiopulmonary abnormality. Electronically Signed   By: Meda Klinefelter M.D.   On: 05/20/2022 18:31    Procedures Procedures    Medications Ordered in ED Medications  QUEtiapine (SEROQUEL) tablet 50 mg (has no administration in time range)  QUEtiapine (SEROQUEL) tablet 25 mg (has no administration in time range)  FLUoxetine (PROZAC) capsule 10 mg (has no administration in time range)    ED Course/ Medical Decision Making/ A&P                             Medical Decision Making Amount and/or Complexity of Data Reviewed Labs: ordered. Radiology: ordered.  Risk Prescription drug management.   Patient with shortness of breath cough and psychiatric plaints.  Reviewed notes and history of same.  X-ray reassuring.  Blood work reassuring.  Not hypoxic ambulation.  From a medical standpoint patient is cleared.  Given inhaler.  However also states he is hearing voices and suicidal.  Questionable plan.  Will have patient seen by TTS.   patient has been seen by psychiatry and cleared for discharge. will you inhaler at home no pneumonia        Final Clinical Impression(s) / ED Diagnoses Final diagnoses:  Suicidal ideation  Cocaine use  Chronic obstructive pulmonary disease, unspecified COPD type [facility])    Rx / DC Orders ED Discharge Orders     None         Benjiman Core, MD 05/20/22 2034    Benjiman Core, MD 05/21/22 0004

## 2022-05-20 NOTE — ED Triage Notes (Signed)
BIBA from the street in [place]- c/o [facility] since 1300, hx of COPD, SI/HI auditory/visual hallucinations. DuoNeb PTA 110 HR 140/82 BP 98% room air 18 RAC

## 2022-05-20 NOTE — Progress Notes (Signed)
Per Sindy Guadeloupe, NP pt does not meet criteria for inpatient behavioral health placement. Pt has been psych cleared. This CSW will now remove pt from the [facility] shift report. TOC to assist if there is an identified need for discharge.   Maryjean Ka, MSW, [facility] 05/20/2022 10:39 PM

## 2022-05-20 NOTE — ED Notes (Signed)
Pt O2 sat 96% while ambulating.  

## 2022-05-21 MED ORDER — ALBUTEROL SULFATE HFA 108 (90 BASE) MCG/ACT IN AERS
2.0000 | INHALATION_SPRAY | Freq: Once | RESPIRATORY_TRACT | Status: AC
  Administered 2022-05-21: 2 via RESPIRATORY_TRACT
  Filled 2022-05-21: qty 6.7

## 2022-05-24 ENCOUNTER — Inpatient Hospital Stay
Admission: EM | Admit: 2022-05-24 | Discharge: 2022-05-25 | DRG: 190 | Disposition: A | Discharge status: Home / Self Care | Payer: Medicaid Other | Attending: Internal Medicine | Admitting: Internal Medicine

## 2022-05-24 ENCOUNTER — Emergency Department: Payer: Medicaid Other

## 2022-05-24 ENCOUNTER — Other Ambulatory Visit: Payer: Self-pay

## 2022-05-24 ENCOUNTER — Encounter: Payer: Self-pay | Admitting: Medical Oncology

## 2022-05-24 DIAGNOSIS — F17210 Nicotine dependence, cigarettes, uncomplicated: Secondary | ICD-10-CM | POA: Diagnosis present

## 2022-05-24 DIAGNOSIS — Z833 Family history of diabetes mellitus: Secondary | ICD-10-CM | POA: Diagnosis not present

## 2022-05-24 DIAGNOSIS — F209 Schizophrenia, unspecified: Secondary | ICD-10-CM | POA: Diagnosis present

## 2022-05-24 DIAGNOSIS — J690 Pneumonitis due to inhalation of food and vomit: Secondary | ICD-10-CM | POA: Diagnosis present

## 2022-05-24 DIAGNOSIS — J441 Chronic obstructive pulmonary disease with (acute) exacerbation: Principal | ICD-10-CM | POA: Diagnosis present

## 2022-05-24 DIAGNOSIS — J189 Pneumonia, unspecified organism: Secondary | ICD-10-CM

## 2022-05-24 DIAGNOSIS — Z8249 Family history of ischemic heart disease and other diseases of the circulatory system: Secondary | ICD-10-CM

## 2022-05-24 DIAGNOSIS — F3130 Bipolar disorder, current episode depressed, mild or moderate severity, unspecified: Secondary | ICD-10-CM | POA: Diagnosis present

## 2022-05-24 DIAGNOSIS — F1910 Other psychoactive substance abuse, uncomplicated: Secondary | ICD-10-CM | POA: Diagnosis not present

## 2022-05-24 DIAGNOSIS — Z79899 Other long term (current) drug therapy: Secondary | ICD-10-CM

## 2022-05-24 DIAGNOSIS — F1410 Cocaine abuse, uncomplicated: Secondary | ICD-10-CM | POA: Diagnosis present

## 2022-05-24 DIAGNOSIS — Z1152 Encounter for screening for COVID-19: Secondary | ICD-10-CM | POA: Diagnosis not present

## 2022-05-24 LAB — CBC WITH DIFFERENTIAL/PLATELET
Abs Immature Granulocytes: 0.01 10*3/uL (ref 0.00–0.07)
Basophils Absolute: 0.1 10*3/uL (ref 0.0–0.1)
Basophils Relative: 1 %
Eosinophils Absolute: 0.6 10*3/uL — ABNORMAL HIGH (ref 0.0–0.5)
Eosinophils Relative: 9 %
HCT: 48.3 % (ref 39.0–52.0)
Hemoglobin: 15.2 g/dL (ref 13.0–17.0)
Immature Granulocytes: 0 %
Lymphocytes Relative: 27 %
Lymphs Abs: 1.7 10*3/uL (ref 0.7–4.0)
MCH: 30 pg (ref 26.0–34.0)
MCHC: 31.5 g/dL (ref 30.0–36.0)
MCV: 95.3 fL (ref 80.0–100.0)
Monocytes Absolute: 0.4 10*3/uL (ref 0.1–1.0)
Monocytes Relative: 6 %
Neutro Abs: 3.6 10*3/uL (ref 1.7–7.7)
Neutrophils Relative %: 57 %
Platelets: 331 10*3/uL (ref 150–400)
RBC: 5.07 MIL/uL (ref 4.22–5.81)
RDW: 15 % (ref 11.5–15.5)
WBC: 6.3 10*3/uL (ref 4.0–10.5)
nRBC: 0 % (ref 0.0–0.2)

## 2022-05-24 LAB — CBC
HCT: 43.9 % (ref 39.0–52.0)
Hemoglobin: 14.1 g/dL (ref 13.0–17.0)
MCH: 30.2 pg (ref 26.0–34.0)
MCHC: 32.1 g/dL (ref 30.0–36.0)
MCV: 94 fL (ref 80.0–100.0)
Platelets: 300 10*3/uL (ref 150–400)
RBC: 4.67 MIL/uL (ref 4.22–5.81)
RDW: 14.7 % (ref 11.5–15.5)
WBC: 7.7 10*3/uL (ref 4.0–10.5)
nRBC: 0 % (ref 0.0–0.2)

## 2022-05-24 LAB — BASIC METABOLIC PANEL
Anion gap: 7 (ref 5–15)
BUN: 12 mg/dL (ref 6–20)
CO2: 27 mmol/L (ref 22–32)
Calcium: 9.1 mg/dL (ref 8.9–10.3)
Chloride: 106 mmol/L (ref 98–111)
Creatinine, Ser: 0.92 mg/dL (ref 0.61–1.24)
GFR, Estimated: >60 mL/min (ref >60)
Glucose, Bld: 138 mg/dL — ABNORMAL HIGH (ref 70–99)
Potassium: 4.3 mmol/L (ref 3.5–5.1)
Sodium: 140 mmol/L (ref 135–145)

## 2022-05-24 LAB — SARS CORONAVIRUS 2 BY RT PCR: SARS Coronavirus 2 by RT PCR: NEGATIVE

## 2022-05-24 LAB — BRAIN NATRIURETIC PEPTIDE: B Natriuretic Peptide: 33.4 pg/mL (ref 0.0–100.0)

## 2022-05-24 LAB — CREATININE, SERUM
Creatinine, Ser: 0.86 mg/dL (ref 0.61–1.24)
GFR, Estimated: >60 mL/min (ref >60)

## 2022-05-24 LAB — TROPONIN I (HIGH SENSITIVITY)
Troponin I (High Sensitivity): 5 ng/L (ref <18)
Troponin I (High Sensitivity): 4 ng/L (ref <18)

## 2022-05-24 LAB — HIV ANTIBODY (ROUTINE TESTING W REFLEX): HIV Screen 4th Generation wRfx: NONREACTIVE

## 2022-05-24 MED ORDER — ORAL CARE MOUTH RINSE: 15.0000 mL | OROMUCOSAL | Status: DC | PRN

## 2022-05-24 MED ORDER — PREDNISONE 20 MG PO TABS
40.0000 mg | ORAL_TABLET | Freq: Every day | ORAL | Status: DC
  Administered 2022-05-25: 40 mg via ORAL
  Filled 2022-05-24: qty 2

## 2022-05-24 MED ORDER — METHYLPREDNISOLONE SODIUM SUCC 125 MG IJ SOLR
125.0000 mg | Freq: Once | INTRAMUSCULAR | Status: AC
  Administered 2022-05-24: 125 mg via INTRAVENOUS
  Filled 2022-05-24: qty 2

## 2022-05-24 MED ORDER — IPRATROPIUM-ALBUTEROL 0.5-2.5 (3) MG/3ML IN SOLN
3.0000 mL | Freq: Once | RESPIRATORY_TRACT | Status: AC
  Administered 2022-05-24: 3 mL via RESPIRATORY_TRACT
  Filled 2022-05-24: qty 3

## 2022-05-24 MED ORDER — SODIUM CHLORIDE 0.9 % IV SOLN
500.0000 mg | Freq: Once | INTRAVENOUS | Status: AC
  Administered 2022-05-24: 500 mg via INTRAVENOUS
  Filled 2022-05-24: qty 5

## 2022-05-24 MED ORDER — AMOXICILLIN-POT CLAVULANATE 875-125 MG PO TABS: 1.0000 | ORAL_TABLET | Freq: Two times a day (BID) | ORAL | Status: DC

## 2022-05-24 MED ORDER — IPRATROPIUM-ALBUTEROL 0.5-2.5 (3) MG/3ML IN SOLN
3.0000 mL | Freq: Four times a day (QID) | RESPIRATORY_TRACT | Status: DC
  Administered 2022-05-24 – 2022-05-25 (×3): 3 mL via RESPIRATORY_TRACT
  Filled 2022-05-24 (×5): qty 3

## 2022-05-24 MED ORDER — SODIUM CHLORIDE 0.9 % IV SOLN
1.0000 g | Freq: Once | INTRAVENOUS | Status: AC
  Administered 2022-05-24: 1 g via INTRAVENOUS
  Filled 2022-05-24: qty 10

## 2022-05-24 MED ORDER — ENOXAPARIN SODIUM 40 MG/0.4ML IJ SOSY
40.0000 mg | PREFILLED_SYRINGE | INTRAMUSCULAR | Status: DC
  Administered 2022-05-24: 40 mg via SUBCUTANEOUS
  Filled 2022-05-24: qty 0.4

## 2022-05-24 NOTE — ED Notes (Signed)
Pt provided sandwich tray per request. 

## 2022-05-24 NOTE — H&P (Signed)
History and Physical    Patient: Franklin [name] WUJ:[id number] DOB: 04/07/1970 DOA: 05/24/2022 DOS: the patient was seen and examined on 05/24/2022 PCP: Patient, No Pcp Per  Patient coming from: Home  Chief Complaint:  Chief Complaint  Patient presents with   Shortness of Breath   HPI: Franklin [name] is a 52 y.o. male with medical history significant of uremia, bipolar 1 disorder, COPD, cocaine abuse, tobacco abuse who came to the hospital with increased shortness of breath. Patient continues to smoke, using cocaine.  Last use was 3 days ago.  After each use of cocaine, patient became very confused, he normally has significant cough after waking up.  He has been having worsening short of breath for the past week.  He has been in the hospital every week because of this.  Upon arriving the hospital, chest x-ray showed bilateral lower lobe interstitial changes, negative BNP.  Negative troponin.  Patient was given steroids, antibiotics, inhaled bronchodilators. Review of Systems: As mentioned in the history of present illness. All other systems reviewed and are negative. Past Medical History:  Diagnosis Date   Anxiety    Bipolar 1 disorder (HCC)    COPD (chronic obstructive pulmonary disease) (HCC)    Depression    No pertinent past medical history    Schizophrenia (HCC)    Substance abuse (HCC)    Past Surgical History:  Procedure Laterality Date   NO PAST SURGERIES     Social History:  reports that he has been smoking cigarettes. He has been smoking an average of .5 packs per day. He has never used smokeless tobacco. He reports current alcohol use of about 12.0 standard drinks of alcohol per week. He reports current drug use. Drugs: "Crack" cocaine and Cocaine.  No Known Allergies  Family History  Problem Relation Age of Onset   Hypertension Other    Diabetes Other     Prior to Admission medications   Medication Sig Start Date End Date Taking? Authorizing Provider  albuterol  (VENTOLIN HFA) 108 (90 Base) MCG/ACT inhaler Inhale 2 puffs into the lungs every 4 (four) hours as needed for wheezing or shortness of breath. Patient not taking: Reported on 05/05/2022 08/10/21   Ward, Layla Maw, DO  albuterol (VENTOLIN HFA) 108 (90 Base) MCG/ACT inhaler Inhale 1-2 puffs into the lungs every 6 (six) hours as needed for wheezing or shortness of breath. Patient not taking: Reported on 05/24/2022 05/11/22   Terald Sleeper, MD  FLUoxetine (PROZAC) 10 MG capsule Take 1 capsule (10 mg total) by mouth daily. Patient not taking: Reported on 05/24/2022 05/12/22 06/11/22  Earney Navy, NP  QUEtiapine (SEROQUEL) 25 MG tablet Take 1 tablet (25 mg total) by mouth daily in the afternoon. Patient not taking: Reported on 05/24/2022 05/11/22 06/10/22  Earney Navy, NP  QUEtiapine (SEROQUEL) 50 MG tablet Take 1 tablet (50 mg total) by mouth at bedtime. Patient not taking: Reported on 05/24/2022 05/11/22 06/10/22  Earney Navy, NP    Physical Exam: Vitals:   05/24/22 1034 05/24/22 1200 05/24/22 1209 05/24/22 1300  BP: 137/86 128/78  119/67  Pulse: 67 72  69  Resp: (!) 21 19  18   Temp:      TempSrc:   Oral   SpO2: 99% 98%  94%  Weight:      Height:       Physical Exam Constitutional:      General: He is not in acute distress.    Appearance: He is normal weight.  He is not toxic-appearing.  HENT:     Head: Normocephalic and atraumatic.  Eyes:     Extraocular Movements: Extraocular movements intact.     Pupils: Pupils are equal, round, and reactive to light.  Neck:     Thyroid: No thyromegaly.     Vascular: No hepatojugular reflux or JVD.     Trachea: No tracheal deviation.  Cardiovascular:     Rate and Rhythm: Normal rate and regular rhythm.     Heart sounds: No murmur heard.    No gallop.  Pulmonary:     Effort: Pulmonary effort is normal. No respiratory distress.     Breath sounds: Decreased breath sounds present. No wheezing.  Abdominal:     General: Bowel sounds are  normal.     Palpations: Abdomen is soft. There is no hepatomegaly or mass.     Tenderness: There is no abdominal tenderness.  Musculoskeletal:     Cervical back: Normal range of motion and neck supple.     Right lower leg: No edema.     Left lower leg: No edema.  Skin:    General: Skin is warm and dry.     Coloration: Skin is not cyanotic.  Neurological:     General: No focal deficit present.     Mental Status: He is alert and oriented to person, place, and time.     Cranial Nerves: No cranial nerve deficit.     Motor: No weakness.  Psychiatric:        Mood and Affect: Mood normal.        Behavior: Behavior normal.     Data Reviewed:  Reviewed chest x-ray results, lab results.  Assessment and Plan: COPD exacerbation. Bilateral lower lobe aspiration pneumonia. Patient has a worsening short of breath over the last few days, consistent with COPD exacerbation. Chest x-ray also showed bilateral lower lobe interstitial changes, this is due to aspiration after using cocaine. Patient be treated with steroids, Augmentin, scheduled bronchodilator.  Cocaine abuse. Tobacco abuse. Advised to quit.  Schizophrenia. Bipolar 1 disorder. It does not appear patient was treated with any medicines.  Recommend follow-up with PCP as outpatient.   Advance Care Planning:   Code Status: Full Code patient is a full code.  Consults: None  Family Communication: None  Severity of Illness: The appropriate patient status for this patient is INPATIENT. Inpatient status is judged to be reasonable and necessary in order to provide the required intensity of service to ensure the patient's safety. The patient's presenting symptoms, physical exam findings, and initial radiographic and laboratory data in the context of their chronic comorbidities is felt to place them at high risk for further clinical deterioration. Furthermore, it is not anticipated that the patient will be medically stable for discharge  from the hospital within 2 midnights of admission.   * I certify that at the point of admission it is my clinical judgment that the patient will require inpatient hospital care spanning beyond 2 midnights from the point of admission due to high intensity of service, high risk for further deterioration and high frequency of surveillance required.*  Author: Marrion Coy, MD 05/24/2022 1:29 PM  For on call review [email or web address].

## 2022-05-24 NOTE — ED Provider Notes (Signed)
[facility] Provider Note    Event Date/Time   First MD Initiated Contact with Patient 05/24/22 [phone]     (approximate)   History   Shortness of Breath   HPI  Franklin [name] is a 52 y.o. male patient presented to the emergency department today because of concerns for shortness of breath.  Patient has history of COPD.  Was seen at outside ED 4 days ago for shortness of breath.  Since then he says he has been struggling.  He ran out of his albuterol this morning around 3 AM.  He does have associated tightness and a feeling of pressure in his chest.  He denies any fevers.  He has had productive cough consisting of whitish phlegm.      Physical Exam   Triage Vital Signs: ED Triage Vitals  Enc Vitals Group     BP 05/24/22 0848 (!) 143/109     Pulse Rate 05/24/22 0848 83     Resp 05/24/22 0848 (!) 26     Temp 05/24/22 0848 98 F (36.7 C)     Temp src --      SpO2 05/24/22 0848 96 %     Weight 05/24/22 0849 158 lb 11.7 oz (72 kg)     Height 05/24/22 0849 5\' 10"  (1.778 m)     Head Circumference --      Peak Flow --      Pain Score 05/24/22 0849 8     Pain Loc --      Pain Edu? --      Excl. in GC? --     Most recent vital signs: Vitals:   05/24/22 0848  BP: (!) 143/109  Pulse: 83  Resp: (!) 26  Temp: 98 F (36.7 C)  SpO2: 96%   General: Awake, alert, oriented. CV:  Good peripheral perfusion. Regular rate and rhythm Resp:  Increased work of breathing. Tachypnea. Diffuse expiratory wheezing. Abd:  No distention.    ED Results / Procedures / Treatments   Labs (all labs ordered are listed, but only abnormal results are displayed) Labs Reviewed  CBC WITH DIFFERENTIAL/PLATELET - Abnormal; Notable for the following components:      Result Value   Eosinophils Absolute 0.6 (*)    All other components within normal limits  BASIC METABOLIC PANEL - Abnormal; Notable for the following components:   Glucose, Bld 138 (*)    All other components  within normal limits  SARS CORONAVIRUS 2 BY RT PCR  BRAIN NATRIURETIC PEPTIDE  TROPONIN I (HIGH SENSITIVITY)  TROPONIN I (HIGH SENSITIVITY)     EKG   I, Phineas Semen, attending physician, personally viewed and interpreted this EKG  EKG Time: 0849 Rate: 90 Rhythm: sinus rhythm with PACs Axis: normal Intervals: qtc 452 QRS: narrow, q waves v1, v2 ST changes: no st elevation Impression: abnormal ekg  RADIOLOGY I independently interpreted and visualized the CXR. My interpretation: No pneumonia Radiology interpretation:  IMPRESSION:  Increased interstitial markings are seen in parahilar regions and  lower lung fields suggesting interstitial edema or interstitial  pneumonia. Part of this finding may be due to difference in  techniques. There is no focal pulmonary consolidation. There is no  pleural effusion or pneumothorax.     PROCEDURES:  Critical Care performed: No   MEDICATIONS ORDERED IN ED: Medications - No data to display   IMPRESSION / MDM / ASSESSMENT AND PLAN / ED COURSE  I reviewed the triage vital signs and the nursing  notes.                              Differential diagnosis includes, but is not limited to, COPD, pneumonia, COVID  Patient's presentation is most consistent with acute presentation with potential threat to life or bodily function.   Patient presented to the emergency department today because of concerns for shortness of breath.  In triage patient with hypoxia and placed on nasal cannula. On exam patient has diffuse expiratory wheezing. CXR is concerning for possible pneumonia.  Patient was started on antibiotics.  Will plan on admission.  Discussed with Dr. Chipper Herb with the hospitalist service.  FINAL CLINICAL IMPRESSION(S) / ED DIAGNOSES   Final diagnoses:  COPD exacerbation (HCC)  Community acquired pneumonia, unspecified laterality      Note:  This document was prepared using Conservation officer, historic buildings and may include  unintentional dictation errors.    Phineas Semen, MD 05/24/22 [phone]

## 2022-05-24 NOTE — ED Triage Notes (Signed)
Pt ambulatory to triage with reports of SOB that worsened last night. Pt also reports chest tightness. Hx of COPD. Pt was 88% on RA, placed on 3L [place].

## 2022-05-25 ENCOUNTER — Other Ambulatory Visit: Payer: Self-pay

## 2022-05-25 DIAGNOSIS — J690 Pneumonitis due to inhalation of food and vomit: Secondary | ICD-10-CM | POA: Diagnosis not present

## 2022-05-25 DIAGNOSIS — F1910 Other psychoactive substance abuse, uncomplicated: Secondary | ICD-10-CM | POA: Diagnosis not present

## 2022-05-25 DIAGNOSIS — J441 Chronic obstructive pulmonary disease with (acute) exacerbation: Secondary | ICD-10-CM | POA: Diagnosis not present

## 2022-05-25 MED ORDER — PREDNISONE 20 MG PO TABS
40.0000 mg | ORAL_TABLET | Freq: Every day | ORAL | 0 refills | Status: DC
  Filled 2022-05-25: qty 8, 4d supply, fill #0

## 2022-05-25 MED ORDER — FLUTICASONE-SALMETEROL 250-50 MCG/ACT IN AEPB
1.0000 | INHALATION_SPRAY | Freq: Two times a day (BID) | RESPIRATORY_TRACT | 0 refills | Status: DC
  Filled 2022-05-25: qty 60, 30d supply, fill #0

## 2022-05-25 MED ORDER — COMBIVENT RESPIMAT 20-100 MCG/ACT IN AERS
1.0000 | INHALATION_SPRAY | Freq: Four times a day (QID) | RESPIRATORY_TRACT | 0 refills | Status: DC
  Filled 2022-05-25: qty 4, 30d supply, fill #0

## 2022-05-25 MED ORDER — ALBUTEROL SULFATE HFA 108 (90 BASE) MCG/ACT IN AERS
2.0000 | INHALATION_SPRAY | Freq: Four times a day (QID) | RESPIRATORY_TRACT | 0 refills | Status: DC | PRN
  Filled 2022-05-25: qty 6.7, 25d supply, fill #0

## 2022-05-25 MED ORDER — AMOXICILLIN-POT CLAVULANATE 875-125 MG PO TABS
1.0000 | ORAL_TABLET | Freq: Two times a day (BID) | ORAL | 0 refills | Status: DC
  Filled 2022-05-25: qty 8, 4d supply, fill #0

## 2022-05-25 NOTE — Discharge Instructions (Addendum)
Intensive Outpatient Programs   [facility] [address] [place],  [place], [place] [phone]  [facility] (Inpatient and [phone] (Suboxone and Methadone) [address] [phone]  [facility]: [facility] - Intensive Outpatient [address] [place], [place] [address], [place]  [phone]  [facility] (Outpatient, Inpatient, [facility] (Groups and Residental) (insurance only) [phone] [place], [place] [phone]   [facility] (for caregivers and family) [address] Laurell Josephs [address], [place], [place] [phone]  Residential Treatment Programs  [facility](2 years) Residential: 68 days)[facility] [address] [place], [place], [place] [phone] or [phone]  [facility] [address] [place], [place], [place] [phone]  [facility] ([facility]) [address] [place], [place], [place] [phone] Admissions: 8am-3pm M-F  [facility] Program: Residential Program [phone] Days)             [facility]: [facility]  [place], [place], [place]  [phone] or [phone] in Hours over the weekend or by referral)   [facility] Crisis: [facility]:[phone] (for crisis response 24 hours a day)     Rent/Utility/Housing  Agency Name: [facility] Address: [address] [place], [place] [address] Phone: [phone] Email:  [email or web address] Website: www.alamanceservices.org Service(s) Offered: Housing services, self-sufficiency, congregate meal  program, weatherization program, Field seismologist program, emergency food assistance,  housing counseling, home ownership program, wheels -towork program.  Agency Name: [facility] Address: [address] [place], [place] [address] Phone: [phone] (8a-4p) [phone] (8p- 10p) Email: [email or web address] Website: [email or web address].[email or web address].org Service(s) Offered: A program for homeless and/or needy men that includes one-on-one counseling, life skills training and job rehabilitation.  Agency Name: [facility] Address: [address], [place], [place] [address] Phone: [phone] Website: www.alliedchurches.org Service(s) Offered: Assistance to needy in emergency with utility bills, heating  fuel, and prescriptions. Shelter for homeless 7pm-7am. April 29, 2016 15  Agency Name: [facility] [place] (Developmentally Disabled) Address: [address], [place], [place] [address] Phone: [phone] Contact Person: Cathleen Corti Email: [email or web address] Website: LinkWedding.[email or web address] Service(s) Offered: Helps individuals with developmental disabilities move  from housing that is more restrictive to homes where they  can achieve greater independence and have more  opportunities.  Agency Name: [facility] Address: [address], [place], [place] [address] Phone: [phone] Email: [email or web address] Website: www.burlingtonhousingauthority.org Service(s) Offered: Provides affordable housing for low-income families,  elderly, and disabled individuals. Offer a wide range of  programs and services, from financial planning to afterschool and summer programs.  Agency Name: [facility] Address: [address] [place], [place] [address] Phone:  [phone] Service(s) Offered: Child support services; child welfare services; food stamps;  Medicaid; work first family assistance; and aid with fuel,  rent, food and medicine.  Agency Name: [facility] [place] [employer]. Address: [facility] [address], [place], [place]  [address] Phone: [phone] Website: www.familyabuseservices.org Service(s) Offered: 24 hour Crisis Line: [phone]; 24 hour Emergency Shelter;  Transitional Housing; Support Groups; Scientist, physiological;  [employer]; Hispanic Outreach: [phone];  Visitation Center: [phone]. April 29, 2016 16  Agency Name: [facility] [place]. Address: [address], [place], [place]  [address] Phone: [phone] Service(s) Offered: CAP Services; Home and [employer]; Individual  or Group Supports; Respite Care Non-Institutional Nursing;  Residential Supports; Respite Care and Personal Care  Services; Transportation; Family and Friends Night;  Recreational Activities; Three Nutritious Meals/Snacks;  Consultation with Registered Dietician; Twenty-four hour  Registered Nurse Access; Daily and [employer]; [place]; [place] for the [employer] (During Summer Months) Bingo Night (Every  Wednesday Night); Special Populations Dance Night  (Every Tuesday Night); Professional Hair Care Services.  Agency Name: [facility] Address: [address], [place], [place] [address] Phone: [phone] Contact Person: Jabier Mutton Website: [email or web address].Physicist, medical) Offered: Residential treatment facility for women; food and  clothing, educational & employment development and  transportation to work; Counsellor of financial skills;  parenting and family reunification; emotional and spiritual  support; transitional housing for program graduates.  Agency Name: [facility] Address: [address], [place], [place] [address] Phone: [phone] Email: [email or web address] Website: TaskTown.es Service(s) Offered: Public housing units for elderly, disabled, and low income  people; housing choice vouchers for income eligible  applicants; shelter plus care vouchers; and [employer] program. April 29, 2016 17  Agency Name: [facility] Address: [address] [place], [place] [address] Phone: [phone] Email: habitat1@netzero .net Website: www.habitatalamance.org Service(s) Offered: Build houses for families in need of decent housing. Each  adult in the family must invest 200 hours of labor on  someone else's house, work with volunteers to build their  own house, attend classes on budgeting, home maintenance, yard care, and attend homeowner association  meetings.  Agency Name: [facility] Address: [address] [place], [place] [address] Phone: [phone] Website: www.rsli.org Service(s) Offered: Intermediate care facilities for mentally retarded,  Supervised Living in group homes for adults with  developmental disabilities, Supervised Living for people  who have dual diagnoses (MRMI), Independent Living,  Supported Living, respite and a variety of CAP services,  pre-vocational services, day supports, and [employer].  Agency Name: [place] [employer] Phone: [phone] Website: www.NCForeclosurePrevention.gov Service(s) Offered: Zero-interest, deferred loans to homeowners struggling to  pay their mortgage. Call for more information

## 2022-05-25 NOTE — TOC Progression Note (Signed)
Transition of Care [facility]) - Progression Note    Patient Details  Name: Franklin [name] MRN: [id number] Date of Birth: 29-Oct-1970  [facility]) CM/SW Contact  Marlowe Sax, RN Phone Number: 05/25/2022, 9:56 AM  Clinical Narrative:   drug use resources and housing resources added to AVS, Already getting meds from [facility], has [facility]         Expected Discharge Plan and Services         Expected Discharge Date: 05/25/22                                     Social Determinants of Health (SDOH) Interventions SDOH Screenings   Housing: Patient Declined (05/24/2022)  Tobacco Use: High Risk (05/24/2022)    Readmission Risk Interventions     No data to display

## 2022-05-25 NOTE — Plan of Care (Signed)
  Problem: Clinical Measurements: Goal: Respiratory complications will improve Outcome: Progressing Goal: Cardiovascular complication will be avoided Outcome: Progressing   Problem: Activity: Goal: Risk for activity intolerance will decrease Outcome: Progressing   Problem: Nutrition: Goal: Adequate nutrition will be maintained Outcome: Progressing   Problem: Coping: Goal: Level of anxiety will decrease Outcome: Progressing   Problem: Elimination: Goal: Will not experience complications related to bowel motility Outcome: Progressing Goal: Will not experience complications related to urinary retention Outcome: Progressing   Problem: Pain Managment: Goal: General experience of comfort will improve Outcome: Progressing   Problem: Safety: Goal: Ability to remain free from injury will improve Outcome: Progressing   Problem: Skin Integrity: Goal: Risk for impaired skin integrity will decrease Outcome: Progressing   Problem: Education: Goal: Knowledge of disease or condition will improve Outcome: Progressing Goal: Knowledge of the prescribed therapeutic regimen will improve Outcome: Progressing Goal: Individualized Educational Video(s) Outcome: Progressing   Problem: Activity: Goal: Ability to tolerate increased activity will improve Outcome: Progressing Goal: Will verbalize the importance of balancing activity with adequate rest periods Outcome: Progressing   Problem: Respiratory: Goal: Ability to maintain a clear airway will improve Outcome: Progressing Goal: Levels of oxygenation will improve Outcome: Progressing Goal: Ability to maintain adequate ventilation will improve Outcome: Progressing

## 2022-05-25 NOTE — Discharge Summary (Addendum)
Physician Discharge Summary   Patient: Franklin [name] MRN: [id number] DOB: June 10, 1970  Admit date:     05/24/2022  Discharge date: 05/25/22  Discharge Physician: Marrion Coy   PCP: Patient, No Pcp Per   Recommendations at discharge:   Follow-up with PCP within 1 week.  TOC will try to set up.  Discharge Diagnoses: Principal Problem:   COPD exacerbation (HCC) Active Problems:   Bipolar I disorder, most recent episode depressed (HCC)   Substance abuse (HCC)   Aspiration pneumonia (HCC) Schizophrenia. Resolved Problems:   * No resolved hospital problems. *  Hospital Course: Franklin [name] is a 52 y.o. male with medical history significant of uremia, bipolar 1 disorder, COPD, cocaine abuse, tobacco abuse who came to the hospital with increased shortness of breath.  Patient is diagnosed with COPD exacerbation and possible aspiration pneumonia.  Patient was treated with oral steroids and Augmentin.  He is also treated with bronchodilators.  Condition improved, currently he does not have any complaint.  Medically stable to be discharged. Assessment and Plan: COPD exacerbation. Bilateral lower lobe aspiration pneumonia. Patient has a worsening short of breath over the last few days, consistent with COPD exacerbation. Chest x-ray also showed bilateral lower lobe interstitial changes, this is due to aspiration after using cocaine. Patient be treated with steroids, Augmentin, scheduled bronchodilator. Condition improved, will continue 4 days steroids and 4 additional days of Augmentin.   Cocaine abuse. Tobacco abuse. Advised to quit.   Schizophrenia. Bipolar 1 disorder. It does not appear patient was treated with any medicines.  Recommend follow-up with PCP as outpatient. Patient currently is not taking any psych medications.        Consultants: None Procedures performed: None  Disposition: Home Diet recommendation:  Discharge Diet Orders (From admission, onward)     Start      Ordered   05/25/22 0000  Diet - low sodium heart healthy        05/25/22 0953           Cardiac diet DISCHARGE MEDICATION: Allergies as of 05/25/2022   No Known Allergies      Medication List     STOP taking these medications    albuterol 108 (90 Base) MCG/ACT inhaler Commonly known as: VENTOLIN HFA   FLUoxetine 10 MG capsule Commonly known as: PROZAC   QUEtiapine 25 MG tablet Commonly known as: SEROQUEL   QUEtiapine 50 MG tablet Commonly known as: SEROQUEL       TAKE these medications    amoxicillin-clavulanate 875-125 MG tablet Commonly known as: AUGMENTIN Take 1 tablet by mouth every 12 (twelve) hours for 4 days.   Combivent Respimat 20-100 MCG/ACT Aers respimat Generic drug: Ipratropium-Albuterol Inhale 1 puff into the lungs every 6 (six) hours.   fluticasone-salmeterol 250-50 MCG/ACT Aepb Commonly known as: Advair Diskus Inhale 1 puff into the lungs in the morning and at bedtime.   predniSONE 20 MG tablet Commonly known as: DELTASONE Take 2 tablets (40 mg total) by mouth daily with breakfast for 4 days. Start taking on: May 26, 2022        Discharge Exam: Franklin [name] Weights   05/24/22 0849  Weight: 72 kg   General exam: Appears calm and comfortable  Respiratory system: Clear to auscultation. Respiratory effort normal. Cardiovascular system: S1 & S2 heard, RRR. No JVD, murmurs, rubs, gallops or clicks. No pedal edema. Gastrointestinal system: Abdomen is nondistended, soft and nontender. No organomegaly or masses felt. Normal bowel sounds heard. Central nervous system: Alert and oriented. No  focal neurological deficits. Extremities: Symmetric 5 x 5 power. Skin: No rashes, lesions or ulcers Psychiatry: Judgement and insight appear normal. Mood & affect appropriate.    Condition at discharge: good  The results of significant diagnostics from this hospitalization (including imaging, microbiology, ancillary and laboratory) are listed below for  reference.   Imaging Studies: DG Chest Portable 1 View  Result Date: 05/24/2022 CLINICAL DATA:  Shortness of breath EXAM: PORTABLE CHEST 1 VIEW COMPARISON:  Previous studies including the examination of 05/20/2022 FINDINGS: Cardiac size is within normal limits. There is increase in interstitial markings in the parahilar regions and lower lung fields. There is no focal consolidation. There is no pleural effusion or pneumothorax. IMPRESSION: Increased interstitial markings are seen in parahilar regions and lower lung fields suggesting interstitial edema or interstitial pneumonia. Part of this finding may be due to difference in techniques. There is no focal pulmonary consolidation. There is no pleural effusion or pneumothorax. Electronically Signed   By: Ernie Avena M.D.   On: 05/24/2022 09:36   DG Chest 2 View  Result Date: 05/20/2022 CLINICAL DATA:  sob EXAM: CHEST - 2 VIEW COMPARISON:  May 10, 2022 FINDINGS: The cardiomediastinal silhouette is normal in contour. No pleural effusion. No pneumothorax. No acute pleuroparenchymal abnormality. Visualized abdomen is unremarkable. No acute osseous abnormality noted. IMPRESSION: No acute cardiopulmonary abnormality. Electronically Signed   By: Meda Klinefelter M.D.   On: 05/20/2022 18:31   DG Chest Port 1 View  Result Date: 05/10/2022 CLINICAL DATA:  Shortness of breath EXAM: PORTABLE CHEST 1 VIEW COMPARISON:  05/05/2022 FINDINGS: The heart size and mediastinal contours are within normal limits. Both lungs are clear. The visualized skeletal structures are unremarkable. IMPRESSION: No active disease. Electronically Signed   By: Charlett Nose M.D.   On: 05/10/2022 02:17   DG Chest Portable 1 View  Result Date: 05/05/2022 CLINICAL DATA:  Shortness of breath EXAM: PORTABLE CHEST 1 VIEW COMPARISON:  Radiograph 04/27/2022 FINDINGS: Stable cardiomediastinal silhouette. Hyperinflation. No focal consolidation, pleural effusion, or pneumothorax. No displaced  rib fractures. IMPRESSION: No active disease. Electronically Signed   By: Minerva Fester M.D.   On: 05/05/2022 20:04   DG Chest Port 1 View  Result Date: 04/27/2022 CLINICAL DATA:  Shortness of breath.  COPD exacerbation. EXAM: PORTABLE CHEST 1 VIEW COMPARISON:  04/22/2022 FINDINGS: The cardiac silhouette, mediastinal and hilar contours are within normal limits and stable. Stable emphysematous changes and pulmonary hyperinflation. Vague area of airspace opacification in the left mid lung suspicious for superimposed pneumonia. IMPRESSION: Emphysematous changes with superimposed left midlung pneumonia. Electronically Signed   By: Rudie Meyer M.D.   On: 04/27/2022 12:25    Microbiology: Results for orders placed or performed during the hospital encounter of 05/24/22  SARS Coronavirus 2 by RT PCR (hospital order, performed in [facility] hospital lab) *cepheid single result test* Anterior Nasal Swab     Status: None   Collection Time: 05/24/22  9:26 AM   Specimen: Anterior Nasal Swab  Result Value Ref Range Status   SARS Coronavirus 2 by RT PCR NEGATIVE NEGATIVE Final    Comment: (NOTE) SARS-CoV-2 target nucleic acids are NOT DETECTED.  The SARS-CoV-2 RNA is generally detectable in upper and lower respiratory specimens during the acute phase of infection. The lowest concentration of SARS-CoV-2 viral copies this assay can detect is 250 copies / mL. A negative result does not preclude SARS-CoV-2 infection and should not be used as the sole basis for treatment or other patient management decisions.  A negative result may occur with improper specimen collection / handling, submission of specimen other than nasopharyngeal swab, presence of viral mutation(s) within the areas targeted by this assay, and inadequate number of viral copies (<250 copies / mL). A negative result must be combined with clinical observations, patient history, and epidemiological information.  Fact Sheet for Patients:    RoadLapTop.co.za  Fact Sheet for Healthcare Providers: http://kim-miller.com/  This test is not yet approved or  cleared by the [place] FDA and has been authorized for detection and/or diagnosis of SARS-CoV-2 by FDA under an Emergency Use Authorization (EUA).  This EUA will remain in effect (meaning this test can be used) for the duration of the COVID-19 declaration under Section 564(b)(1) of the Act, 21 U.S.C. section 360bbb-3(b)(1), unless the authorization is terminated or revoked sooner.  Performed at [facility] [address] [place], [place] [address]     Labs: CBC: Recent Labs  Lab 05/20/22 1820 05/24/22 0926 05/24/22 1421  WBC 7.8 6.3 7.7  NEUTROABS  --  3.6  --   HGB 12.6* 15.2 14.1  HCT 37.9* 48.3 43.9  MCV 90.9 95.3 94.0  PLT 327 331 300   Basic Metabolic Panel: Recent Labs  Lab 05/20/22 1820 05/24/22 0926 05/24/22 1421  NA 136 140  --   K 3.5 4.3  --   CL 102 106  --   CO2 25 27  --   GLUCOSE 128* 138*  --   BUN 14 12  --   CREATININE 0.95 0.92 0.86  CALCIUM 8.7* 9.1  --    Liver Function Tests: Recent Labs  Lab 05/20/22 1820  AST 21  ALT 19  ALKPHOS 55  BILITOT 0.7  PROT 6.6  ALBUMIN 3.4*   CBG: No results for input(s): "GLUCAP" in the last 168 hours.  Discharge time spent: greater than 30 minutes.  Signed: Marrion Coy, MD [facility] 05/25/2022

## 2022-05-25 NOTE — Progress Notes (Signed)
Discharge instructions reviewed with patient. He verbalized understanding of instructions. Belongings returned to patient. Discharge papers given to him

## 2022-05-28 ENCOUNTER — Emergency Department (HOSPITAL_COMMUNITY): Payer: Medicaid Other

## 2022-05-28 ENCOUNTER — Other Ambulatory Visit: Payer: Self-pay

## 2022-05-28 ENCOUNTER — Encounter (HOSPITAL_COMMUNITY): Payer: Self-pay

## 2022-05-28 ENCOUNTER — Inpatient Hospital Stay (HOSPITAL_COMMUNITY)
Admission: EM | Admit: 2022-05-28 | Discharge: 2022-05-31 | DRG: 190 | Discharge status: Against Medical Advice | Payer: Medicaid Other | Attending: Family Medicine | Admitting: Family Medicine

## 2022-05-28 DIAGNOSIS — F419 Anxiety disorder, unspecified: Secondary | ICD-10-CM | POA: Diagnosis present

## 2022-05-28 DIAGNOSIS — F1490 Cocaine use, unspecified, uncomplicated: Secondary | ICD-10-CM | POA: Diagnosis present

## 2022-05-28 DIAGNOSIS — J9601 Acute respiratory failure with hypoxia: Secondary | ICD-10-CM | POA: Diagnosis present

## 2022-05-28 DIAGNOSIS — R000 Tachycardia, unspecified: Secondary | ICD-10-CM | POA: Diagnosis present

## 2022-05-28 DIAGNOSIS — Z79899 Other long term (current) drug therapy: Secondary | ICD-10-CM

## 2022-05-28 DIAGNOSIS — F17210 Nicotine dependence, cigarettes, uncomplicated: Secondary | ICD-10-CM | POA: Diagnosis present

## 2022-05-28 DIAGNOSIS — F1020 Alcohol dependence, uncomplicated: Secondary | ICD-10-CM | POA: Diagnosis present

## 2022-05-28 DIAGNOSIS — Z7951 Long term (current) use of inhaled steroids: Secondary | ICD-10-CM

## 2022-05-28 DIAGNOSIS — J441 Chronic obstructive pulmonary disease with (acute) exacerbation: Principal | ICD-10-CM | POA: Diagnosis present

## 2022-05-28 DIAGNOSIS — J440 Chronic obstructive pulmonary disease with acute lower respiratory infection: Secondary | ICD-10-CM | POA: Diagnosis present

## 2022-05-28 DIAGNOSIS — F4381 Prolonged grief disorder: Secondary | ICD-10-CM | POA: Diagnosis present

## 2022-05-28 DIAGNOSIS — Z1152 Encounter for screening for COVID-19: Secondary | ICD-10-CM

## 2022-05-28 DIAGNOSIS — F209 Schizophrenia, unspecified: Secondary | ICD-10-CM | POA: Diagnosis present

## 2022-05-28 DIAGNOSIS — F319 Bipolar disorder, unspecified: Secondary | ICD-10-CM | POA: Diagnosis present

## 2022-05-28 DIAGNOSIS — J189 Pneumonia, unspecified organism: Secondary | ICD-10-CM | POA: Diagnosis present

## 2022-05-28 LAB — CBC
HCT: 41 % (ref 39.0–52.0)
Hemoglobin: 13.5 g/dL (ref 13.0–17.0)
MCH: 29.5 pg (ref 26.0–34.0)
MCHC: 32.9 g/dL (ref 30.0–36.0)
MCV: 89.7 fL (ref 80.0–100.0)
Platelets: 302 10*3/uL (ref 150–400)
RBC: 4.57 MIL/uL (ref 4.22–5.81)
RDW: 14.4 % (ref 11.5–15.5)
WBC: 8.9 10*3/uL (ref 4.0–10.5)
nRBC: 0 % (ref 0.0–0.2)

## 2022-05-28 LAB — I-STAT CHEM 8, ED
BUN: 20 mg/dL (ref 6–20)
Calcium, Ion: 1.08 mmol/L — ABNORMAL LOW (ref 1.15–1.40)
Chloride: 104 mmol/L (ref 98–111)
Creatinine, Ser: 0.8 mg/dL (ref 0.61–1.24)
Glucose, Bld: 103 mg/dL — ABNORMAL HIGH (ref 70–99)
HCT: 42 % (ref 39.0–52.0)
Hemoglobin: 14.3 g/dL (ref 13.0–17.0)
Potassium: 4.2 mmol/L (ref 3.5–5.1)
Sodium: 139 mmol/L (ref 135–145)
TCO2: 27 mmol/L (ref 22–32)

## 2022-05-28 LAB — I-STAT VENOUS BLOOD GAS, ED
Acid-base deficit: 3 mmol/L — ABNORMAL HIGH (ref 0.0–2.0)
Bicarbonate: 23.1 mmol/L (ref 20.0–28.0)
Calcium, Ion: 1.08 mmol/L — ABNORMAL LOW (ref 1.15–1.40)
HCT: 43 % (ref 39.0–52.0)
Hemoglobin: 14.6 g/dL (ref 13.0–17.0)
O2 Saturation: 98 %
Potassium: 3.7 mmol/L (ref 3.5–5.1)
Sodium: 138 mmol/L (ref 135–145)
TCO2: 24 mmol/L (ref 22–32)
pCO2, Ven: 41.7 mmHg — ABNORMAL LOW (ref 44–60)
pH, Ven: 7.35 (ref 7.25–7.43)
pO2, Ven: 111 mmHg — ABNORMAL HIGH (ref 32–45)

## 2022-05-28 LAB — CBC WITH DIFFERENTIAL/PLATELET
Abs Immature Granulocytes: 0.04 10*3/uL (ref 0.00–0.07)
Basophils Absolute: 0.1 10*3/uL (ref 0.0–0.1)
Basophils Relative: 1 %
Eosinophils Absolute: 0.1 10*3/uL (ref 0.0–0.5)
Eosinophils Relative: 1 %
HCT: 39.7 % (ref 39.0–52.0)
Hemoglobin: 13.3 g/dL (ref 13.0–17.0)
Immature Granulocytes: 0 %
Lymphocytes Relative: 6 %
Lymphs Abs: 0.7 10*3/uL (ref 0.7–4.0)
MCH: 30.4 pg (ref 26.0–34.0)
MCHC: 33.5 g/dL (ref 30.0–36.0)
MCV: 90.8 fL (ref 80.0–100.0)
Monocytes Absolute: 0.1 10*3/uL (ref 0.1–1.0)
Monocytes Relative: 1 %
Neutro Abs: 11.1 10*3/uL — ABNORMAL HIGH (ref 1.7–7.7)
Neutrophils Relative %: 91 %
Platelets: 302 10*3/uL (ref 150–400)
RBC: 4.37 MIL/uL (ref 4.22–5.81)
RDW: 14.6 % (ref 11.5–15.5)
WBC: 12.1 10*3/uL — ABNORMAL HIGH (ref 4.0–10.5)
nRBC: 0 % (ref 0.0–0.2)

## 2022-05-28 LAB — CREATININE, SERUM
Creatinine, Ser: 0.88 mg/dL (ref 0.61–1.24)
GFR, Estimated: >60 mL/min (ref >60)

## 2022-05-28 LAB — BASIC METABOLIC PANEL
Anion gap: 12 (ref 5–15)
BUN: 11 mg/dL (ref 6–20)
CO2: 23 mmol/L (ref 22–32)
Calcium: 8.6 mg/dL — ABNORMAL LOW (ref 8.9–10.3)
Chloride: 102 mmol/L (ref 98–111)
Creatinine, Ser: 0.77 mg/dL (ref 0.61–1.24)
GFR, Estimated: >60 mL/min (ref >60)
Glucose, Bld: 123 mg/dL — ABNORMAL HIGH (ref 70–99)
Potassium: 3.6 mmol/L (ref 3.5–5.1)
Sodium: 137 mmol/L (ref 135–145)

## 2022-05-28 LAB — MAGNESIUM: Magnesium: 2.2 mg/dL (ref 1.7–2.4)

## 2022-05-28 LAB — PHOSPHORUS: Phosphorus: 2.3 mg/dL — ABNORMAL LOW (ref 2.5–4.6)

## 2022-05-28 LAB — ETHANOL: Alcohol, Ethyl (B): <10 mg/dL (ref <10)

## 2022-05-28 LAB — BRAIN NATRIURETIC PEPTIDE: B Natriuretic Peptide: 40.1 pg/mL (ref 0.0–100.0)

## 2022-05-28 LAB — SARS CORONAVIRUS 2 BY RT PCR: SARS Coronavirus 2 by RT PCR: NEGATIVE

## 2022-05-28 MED ORDER — LORAZEPAM 1 MG PO TABS: 0.0000 mg | ORAL_TABLET | Freq: Two times a day (BID) | ORAL | Status: DC

## 2022-05-28 MED ORDER — SODIUM CHLORIDE 0.9 % IV SOLN
500.0000 mg | Freq: Once | INTRAVENOUS | Status: AC
  Administered 2022-05-28: 500 mg via INTRAVENOUS
  Filled 2022-05-28: qty 5

## 2022-05-28 MED ORDER — BUDESONIDE 0.5 MG/2ML IN SUSP
0.5000 mg | Freq: Two times a day (BID) | RESPIRATORY_TRACT | Status: DC
  Administered 2022-05-28 – 2022-05-31 (×6): 0.5 mg via RESPIRATORY_TRACT
  Filled 2022-05-28 (×6): qty 2

## 2022-05-28 MED ORDER — SODIUM CHLORIDE 0.9 % IV SOLN
1.0000 g | INTRAVENOUS | Status: DC
  Administered 2022-05-28: 1 g via INTRAVENOUS
  Filled 2022-05-28: qty 10

## 2022-05-28 MED ORDER — ACETAMINOPHEN 650 MG RE SUPP: 650.0000 mg | Freq: Four times a day (QID) | RECTAL | Status: DC | PRN

## 2022-05-28 MED ORDER — IPRATROPIUM-ALBUTEROL 0.5-2.5 (3) MG/3ML IN SOLN
3.0000 mL | Freq: Four times a day (QID) | RESPIRATORY_TRACT | Status: DC
  Administered 2022-05-28 (×3): 3 mL via RESPIRATORY_TRACT
  Filled 2022-05-28 (×3): qty 3

## 2022-05-28 MED ORDER — ACETAMINOPHEN 500 MG PO TABS
1000.0000 mg | ORAL_TABLET | ORAL | Status: AC
  Administered 2022-05-28: 1000 mg via ORAL
  Filled 2022-05-28: qty 2

## 2022-05-28 MED ORDER — ONDANSETRON HCL 4 MG/2ML IJ SOLN: 4.0000 mg | Freq: Four times a day (QID) | INTRAMUSCULAR | Status: DC | PRN

## 2022-05-28 MED ORDER — ALBUTEROL SULFATE (2.5 MG/3ML) 0.083% IN NEBU
15.0000 mg/h | INHALATION_SOLUTION | Freq: Once | RESPIRATORY_TRACT | Status: AC
  Administered 2022-05-28: 15 mg/h via RESPIRATORY_TRACT
  Filled 2022-05-28: qty 3

## 2022-05-28 MED ORDER — LORAZEPAM 1 MG PO TABS
0.0000 mg | ORAL_TABLET | Freq: Four times a day (QID) | ORAL | Status: DC
  Administered 2022-05-28 – 2022-05-29 (×3): 1 mg via ORAL
  Filled 2022-05-28 (×3): qty 1

## 2022-05-28 MED ORDER — DIVALPROEX SODIUM 250 MG PO DR TAB
250.0000 mg | DELAYED_RELEASE_TABLET | Freq: Two times a day (BID) | ORAL | Status: DC
  Administered 2022-05-28 – 2022-05-31 (×7): 250 mg via ORAL
  Filled 2022-05-28 (×7): qty 1

## 2022-05-28 MED ORDER — QUETIAPINE FUMARATE 25 MG PO TABS
25.0000 mg | ORAL_TABLET | Freq: Every day | ORAL | Status: DC
  Administered 2022-05-28 – 2022-05-30 (×3): 25 mg via ORAL
  Filled 2022-05-28 (×3): qty 1

## 2022-05-28 MED ORDER — ENOXAPARIN SODIUM 40 MG/0.4ML IJ SOSY
40.0000 mg | PREFILLED_SYRINGE | INTRAMUSCULAR | Status: DC
  Administered 2022-05-28 – 2022-05-31 (×3): 40 mg via SUBCUTANEOUS
  Filled 2022-05-28 (×3): qty 0.4

## 2022-05-28 MED ORDER — FOLIC ACID 1 MG PO TABS
1.0000 mg | ORAL_TABLET | Freq: Every day | ORAL | Status: DC
  Administered 2022-05-28 – 2022-05-31 (×4): 1 mg via ORAL
  Filled 2022-05-28 (×4): qty 1

## 2022-05-28 MED ORDER — EPINEPHRINE 0.3 MG/0.3ML IJ SOAJ
0.3000 mg | Freq: Once | INTRAMUSCULAR | Status: AC
  Administered 2022-05-28: 0.3 mg via INTRAMUSCULAR
  Filled 2022-05-28: qty 0.3

## 2022-05-28 MED ORDER — METHYLPREDNISOLONE SODIUM SUCC 125 MG IJ SOLR
120.0000 mg | INTRAMUSCULAR | Status: DC
  Administered 2022-05-28: 120 mg via INTRAVENOUS
  Filled 2022-05-28: qty 2

## 2022-05-28 MED ORDER — THIAMINE HCL 100 MG/ML IJ SOLN
100.0000 mg | Freq: Every day | INTRAMUSCULAR | Status: DC
  Filled 2022-05-28 (×2): qty 2

## 2022-05-28 MED ORDER — AMOXICILLIN-POT CLAVULANATE 875-125 MG PO TABS
1.0000 | ORAL_TABLET | Freq: Once | ORAL | Status: AC
  Administered 2022-05-28: 1 via ORAL
  Filled 2022-05-28: qty 1

## 2022-05-28 MED ORDER — THIAMINE MONONITRATE 100 MG PO TABS
100.0000 mg | ORAL_TABLET | Freq: Every day | ORAL | Status: DC
  Administered 2022-05-28 – 2022-05-31 (×4): 100 mg via ORAL
  Filled 2022-05-28 (×4): qty 1

## 2022-05-28 MED ORDER — AZITHROMYCIN 500 MG PO TABS
250.0000 mg | ORAL_TABLET | Freq: Every day | ORAL | Status: DC
  Administered 2022-05-29 – 2022-05-31 (×3): 250 mg via ORAL
  Filled 2022-05-28 (×3): qty 1

## 2022-05-28 MED ORDER — LORAZEPAM 2 MG/ML IJ SOLN
1.0000 mg | Freq: Once | INTRAMUSCULAR | Status: AC
  Administered 2022-05-28: 1 mg via INTRAVENOUS
  Filled 2022-05-28: qty 1

## 2022-05-28 MED ORDER — PREDNISONE 10 MG PO TABS
40.0000 mg | ORAL_TABLET | Freq: Every day | ORAL | Status: DC
  Administered 2022-05-29 – 2022-05-31 (×3): 40 mg via ORAL
  Filled 2022-05-28 (×3): qty 4

## 2022-05-28 MED ORDER — ADULT MULTIVITAMIN W/MINERALS CH
1.0000 | ORAL_TABLET | Freq: Every day | ORAL | Status: DC
  Administered 2022-05-28 – 2022-05-31 (×4): 1 via ORAL
  Filled 2022-05-28 (×4): qty 1

## 2022-05-28 MED ORDER — ONDANSETRON HCL 4 MG PO TABS
4.0000 mg | ORAL_TABLET | Freq: Four times a day (QID) | ORAL | Status: DC | PRN
  Filled 2022-05-28: qty 1

## 2022-05-28 MED ORDER — SODIUM CHLORIDE 0.9% FLUSH
3.0000 mL | Freq: Two times a day (BID) | INTRAVENOUS | Status: DC
  Administered 2022-05-28 – 2022-05-31 (×7): 3 mL via INTRAVENOUS

## 2022-05-28 MED ORDER — ALBUTEROL SULFATE (2.5 MG/3ML) 0.083% IN NEBU
2.5000 mg | INHALATION_SOLUTION | RESPIRATORY_TRACT | Status: DC | PRN
  Filled 2022-05-28 (×2): qty 3

## 2022-05-28 MED ORDER — NICOTINE 14 MG/24HR TD PT24
14.0000 mg | MEDICATED_PATCH | Freq: Every day | TRANSDERMAL | Status: DC
  Administered 2022-05-28 – 2022-05-31 (×4): 14 mg via TRANSDERMAL
  Filled 2022-05-28 (×4): qty 1

## 2022-05-28 MED ORDER — LORAZEPAM 0.5 MG PO TABS
0.5000 mg | ORAL_TABLET | ORAL | Status: AC | PRN
  Administered 2022-05-28: 0.5 mg via ORAL
  Filled 2022-05-28: qty 1

## 2022-05-28 MED ORDER — AMOXICILLIN-POT CLAVULANATE 875-125 MG PO TABS
1.0000 | ORAL_TABLET | Freq: Two times a day (BID) | ORAL | Status: DC
  Administered 2022-05-28 – 2022-05-31 (×6): 1 via ORAL
  Filled 2022-05-28 (×6): qty 1

## 2022-05-28 MED ORDER — ACETAMINOPHEN 325 MG PO TABS
650.0000 mg | ORAL_TABLET | Freq: Four times a day (QID) | ORAL | Status: DC | PRN
  Filled 2022-05-28: qty 2

## 2022-05-28 MED ORDER — LORAZEPAM 1 MG PO TABS: 1.0000 mg | ORAL_TABLET | ORAL | Status: AC | PRN

## 2022-05-28 NOTE — H&P (Signed)
History and Physical    Patient: Franklin [name] ZOX:[id number] DOB: 30-Jun-1970 DOA: 05/28/2022 DOS: the patient was seen and examined on 05/28/2022 PCP: Patient, No Pcp Per  Patient coming from: Home  Chief Complaint:  Chief Complaint  Patient presents with   Shortness of Breath   HPI: Franklin [name] is a 52 y.o. male with medical history significant of COPD and ongoing tobacco use, substance abuse, alcoholism, bipolar 1 disorder with apparent schizophrenia.  Patient presented to the ED via EMS with reports of shortness of breath.  On the date of presentation patient admitted to the triage nurse that he had been drinking heavily and smoking crack.  O2 sats were 84% when checked by EMS.  Patient was given magnesium, albuterol and Atrovent nebs as well as Solu-Medrol 125 mg without any improvement in symptoms.  Upon arrival to the ED patient was initially placed on BiPAP due to degree of increased work of breathing.  Patient reported recently diagnosed with pneumonia and had been prescribed amoxicillin prednisone and albuterol MDI.  As noted above patient had significant increased work of breathing at presentation and required BiPAP.  He was tachycardic, and hypertensive.  His VBG was unremarkable.  His chest x-ray showed evidence of COPD and chronic bronchitis with no pneumonia.  BNP was normal.  Labs were unremarkable.  He has subsequently been weaned down to 1 L nasal cannula oxygen.  He continues to have diffuse expiratory wheezing.  Hospitalist was asked to evaluate the patient for admission.  Review of Systems: As per HPI.  Patient admits to severe episodes of depression related to his son's mother 2 years ago and the subsequent issues with the ongoing trial.  He is very depressed but has no suicidal ideation.  He reports no PCP and no regular psych follow-up.  He states he gets his medication from an outpatient pharmacy at [facility] but his med rec only shows his recent pulmonary medications and  antibiotics and apparently recent urgent care or ED visit.  [place] in [place]. Past Medical History:  Diagnosis Date   Anxiety    Bipolar 1 disorder (HCC)    COPD (chronic obstructive pulmonary disease) (HCC)    Depression    No pertinent past medical history    Schizophrenia (HCC)    Substance abuse (HCC)    Past Surgical History:  Procedure Laterality Date   NO PAST SURGERIES     Social History:  reports that he has been smoking cigarettes. He has been smoking an average of .5 packs per day. He has never used smokeless tobacco. He reports current alcohol use of about 12.0 standard drinks of alcohol per week. He reports current drug use. Drugs: "Crack" cocaine and Cocaine.  No Known Allergies  Family History  Problem Relation Age of Onset   Hypertension Other    Diabetes Other     Prior to Admission medications   Medication Sig Start Date End Date Taking? Authorizing Provider  albuterol (VENTOLIN HFA) 108 (90 Base) MCG/ACT inhaler Inhale 2 puffs into the lungs every 6 (six) hours as needed for wheezing or shortness of breath. 05/25/22   Marrion Coy, MD  amoxicillin-clavulanate (AUGMENTIN) 875-125 MG tablet Take 1 tablet by mouth every 12 (twelve) hours for 4 days. 05/25/22 05/29/22  Marrion Coy, MD  fluticasone-salmeterol (ADVAIR DISKUS) 250-50 MCG/ACT AEPB Inhale 1 puff into the lungs in the morning and at bedtime. 05/25/22   Marrion Coy, MD  predniSONE (DELTASONE) 20  MG tablet Take 2 tablets (40 mg total) by mouth daily with breakfast for 4 days. 05/26/22 05/30/22  Marrion Coy, MD    Physical Exam: Vitals:   05/28/22 0200 05/28/22 0347 05/28/22 0433 05/28/22 0535  BP: (!) 144/90 (!) 150/93 (!) 143/76 129/75  Pulse: (!) 101 91 98 98  Resp: (!) 21 20 19  (!) 21  Temp:  98.2 F (36.8 C)  98 F (36.7 C)  TempSrc:  Oral  Axillary  SpO2: 100% 100% 93% 95%   Constitutional: NAD, calm, comfortable Respiratory: On posterior  exam patient had diffuse expiratory and expiratory wheezing but good air movement to the bases.  1 L nasal cannula O2.  No increased work of breathing. Cardiovascular: Regular rate and rhythm, no murmurs / rubs / gallops. No extremity edema. 2+ pedal pulses. No carotid bruits.  Abdomen: no tenderness, no masses palpated.  Bowel sounds positive.  Musculoskeletal: no clubbing / cyanosis. No joint deformity upper and lower extremities. Good ROM, no contractures. Normal muscle tone.  Skin: no rashes, lesions, ulcers. No induration Neurologic: CN 2-12 grossly intact. Sensation intact,  Strength 5/5 x all 4 extremities.  Psychiatric: Normal judgment and insight. Alert and oriented x 3.  Variable mood.  Initially was quite tearful and admitted severe depression over his son's murder 2 years ago and inability to cope.  After examining and talking to the patient he changed to a more manic type of mood with rapid somewhat pressured speech.     Data Reviewed:  Sodium 137, potassium 3.6, chloride 102, CO2 23, BUN 11, creatinine 0.77, BNP 40.1, white count 12,100 differential not obtained hemoglobin 13.3, platelets 392,000, COVID PCR negative, ethyl alcohol less than 10.  Assessment and Plan: COPD exacerbation with possible exacerbation of chronic bronchitis Suspect pulmonary symptoms exacerbated by recent smoking of crack cocaine Patient does admit to coughing up thick yellow to white sputum which is new from baseline Has been treated for community-acquired pneumonia in the outpatient setting with oral antibiotics and prednisone as well as albuterol MDI For now we will treat as exacerbation of chronic bronchitis and COPD exacerbation so we will begin scheduled DuoNebs, as needed albuterol nebs, scheduled budesonide and Solu-Medrol 60 mg IV every 12 hours Wean O2 as tolerated  Bipolar 1 disorder/schizophrenia-prolonged grief Patient currently having mood swings from severely depressed to somewhat manic  mood-no suicidal ideation therefore no indication for inpatient psych evaluation Med rec does not list any psych medications and patient unable to recall the names of his prior psych medicines In the interim I have initiated Depakote 250 mg every 12 hours and Seroquel 25 mg at bedtime TOC consultation to assist with establishing outpatient PCP likely through one of our clinics since patient has limited funding.  He also needs to establish with outpatient psych follow-up.  He has also asked for a Medicaid application so he can complete.  Ongoing tobacco abuse/polysubstance abuse Patient admits to alcohol and crack CIWA with Ativan protocol initiated UDS ordered Cessation counseling initiated  Advance Care Planning:   Code Status: Full Code   DVT prophylaxis: Lovenox  Consults: None  Family Communication: Patient only  Severity of Illness: The appropriate patient status for this patient is OBSERVATION. Observation status is judged to be reasonable and necessary in order to provide the required intensity of service to ensure the patient's safety. The patient's presenting symptoms, physical exam findings, and initial radiographic and laboratory data in the context of their medical condition is felt to place them at decreased  risk for further clinical deterioration. Furthermore, it is anticipated that the patient will be medically stable for discharge from the hospital within 2 midnights of admission.   Author: Junious Silk, NP 05/28/2022 7:09 AM  For on call review [email or web address].

## 2022-05-28 NOTE — ED Notes (Signed)
Pt with increased anxiety with BiPap. After speaking with RT and provider, will trial d/cing Bipap and nasal cannula.

## 2022-05-28 NOTE — ED Notes (Signed)
Pt given a sandwich and ginger ale

## 2022-05-28 NOTE — ED Notes (Signed)
Pt given sandwich and ginger ale.

## 2022-05-28 NOTE — ED Provider Notes (Signed)
[place] EMERGENCY DEPARTMENT AT [facility] Provider Note   CSN: [id number] Arrival date & time: 05/28/22  0013     History  Chief Complaint  Patient presents with   Shortness of Breath    Franklin [name] is a 52 y.o. male.   Shortness of Breath  Patient is a 52 year old male with past medical history significant for COPD, still daily smoker, polysubstance abuse, alcohol use disorder, bipolar  He presents emergency room today with complaints of dyspnea.  Upon EMS arrival patient was hypoxic 84% he is not on oxygen chronically.  Patient was discharged from [facility] 5/21 with diagnosis of pneumonia he was started on Augmentin and discharged home with 4 additional days of Augmentin and prednisone he states he has been taking his medications.   He has been coughing frequently, no hemoptysis or fevers at home. He continues to smoke and seems that he used cocaine and drank alcohol tonight.  Patient was transported by EMS and given 2 g of magnesium and route as well as 10 mg of albuterol and 1 of of Atrovent.  Solu-Medrol 125 was given and patient was initiated on BiPAP by EMS.     Home Medications Prior to Admission medications   Medication Sig Start Date End Date Taking? Authorizing Provider  albuterol (VENTOLIN HFA) 108 (90 Base) MCG/ACT inhaler Inhale 2 puffs into the lungs every 6 (six) hours as needed for wheezing or shortness of breath. 05/25/22   Marrion Coy, MD  amoxicillin-clavulanate (AUGMENTIN) 875-125 MG tablet Take 1 tablet by mouth every 12 (twelve) hours for 4 days. 05/25/22 05/29/22  Marrion Coy, MD  fluticasone-salmeterol (ADVAIR DISKUS) 250-50 MCG/ACT AEPB Inhale 1 puff into the lungs in the morning and at bedtime. 05/25/22   Marrion Coy, MD  predniSONE (DELTASONE) 20 MG tablet Take 2 tablets (40 mg total) by mouth daily with breakfast for 4 days. 05/26/22 05/30/22  Marrion Coy, MD      Allergies    Patient has no known allergies.     Review of Systems   Review of Systems  Respiratory:  Positive for shortness of breath.     Physical Exam Updated Vital Signs BP (!) 144/90   Pulse (!) 101   Temp 97.8 F (36.6 C) (Tympanic)   Resp (!) 21   SpO2 100%  Physical Exam Vitals and nursing note reviewed.  Constitutional:      General: He is in acute distress.     Comments: Anxious and diaphoretic  HENT:     Head: Normocephalic and atraumatic.     Nose: Nose normal.  Eyes:     General: No scleral icterus. Cardiovascular:     Rate and Rhythm: Normal rate and regular rhythm.     Pulses: Normal pulses.     Heart sounds: Normal heart sounds.  Pulmonary:     Effort: Respiratory distress present.     Comments: Prolonged expiratory phase, poor air movement, faint wheezing auscultated Tachypneic and anxious and diaphoretic Abdominal:     Palpations: Abdomen is soft.     Tenderness: There is no abdominal tenderness. There is no guarding or rebound.  Musculoskeletal:     Cervical back: Normal range of motion.     Right lower leg: No edema.     Left lower leg: No edema.     Comments: Trace symmetric nonpitting ankle edema  Skin:    General: Skin is warm and dry.     Capillary Refill: Capillary refill takes less  than 2 seconds.  Neurological:     Mental Status: He is alert. Mental status is at baseline.  Psychiatric:        Mood and Affect: Mood normal.        Behavior: Behavior normal.     ED Results / Procedures / Treatments   Labs (all labs ordered are listed, but only abnormal results are displayed) Labs Reviewed  I-STAT VENOUS BLOOD GAS, ED - Abnormal; Notable for the following components:      Result Value   pCO2, Ven 41.7 (*)    pO2, Ven 111 (*)    Acid-base deficit 3.0 (*)    Calcium, Ion 1.08 (*)    All other components within normal limits  I-STAT CHEM 8, ED - Abnormal; Notable for the following components:   Glucose, Bld 103 (*)    Calcium, Ion 1.08 (*)    All other components within normal  limits  BRAIN NATRIURETIC PEPTIDE  BASIC METABOLIC PANEL  CBC WITH DIFFERENTIAL/PLATELET  ETHANOL  I-STAT VENOUS BLOOD GAS, ED    EKG EKG Interpretation  Date/Time:  Friday May 28 2022 00:21:33 EDT Ventricular Rate:  100 PR Interval:  130 QRS Duration: 98 QT Interval:  366 QTC Calculation: 473 R Axis:   51 Text Interpretation: Sinus tachycardia Supraventricular bigeminy Right atrial enlargement When compared with ECG of 05/24/2022, No significant change was found Confirmed by Dione Booze ([id number]) on 05/28/2022 12:26:36 AM  Radiology DG Chest Portable 1 View  Result Date: 05/28/2022 CLINICAL DATA:  Shortness of breath EXAM: PORTABLE CHEST 1 VIEW COMPARISON:  Radiograph 05/24/2022 FINDINGS: Hyperinflation and chronic bronchitic change. No focal consolidation, pleural effusion, or pneumothorax. No displaced rib fractures. Stable cardiomediastinal silhouette. IMPRESSION: No active disease.  Emphysema. Electronically Signed   By: Minerva Fester M.D.   On: 05/28/2022 00:40    Procedures .Critical Care  Performed by: Gailen Shelter, PA Authorized by: Gailen Shelter, PA   Critical care provider statement:    Critical care time (minutes):  35   Critical care time was exclusive of:  Separately billable procedures and treating other patients and teaching time   Critical care was necessary to treat or prevent imminent or life-threatening deterioration of the following conditions:  Respiratory failure   Critical care was time spent personally by me on the following activities:  Development of treatment plan with patient or surrogate, review of old charts, re-evaluation of patient's condition, pulse oximetry, ordering and review of radiographic studies, ordering and review of laboratory studies, ordering and performing treatments and interventions, obtaining history from patient or surrogate, examination of patient and evaluation of patient's response to treatment   Care discussed with:  admitting provider       Medications Ordered in ED Medications  albuterol (PROVENTIL) (2.5 MG/3ML) 0.083% nebulizer solution (15 mg/hr Nebulization Given 05/28/22 0040)  EPINEPHrine (EPI-PEN) injection 0.3 mg (0.3 mg Intramuscular Given 05/28/22 0104)  LORazepam (ATIVAN) injection 1 mg (1 mg Intravenous Given 05/28/22 0103)    ED Course/ Medical Decision Making/ A&P                             Medical Decision Making Amount and/or Complexity of Data Reviewed Labs: ordered. Radiology: ordered.  Risk OTC drugs. Prescription drug management. Decision regarding hospitalization.   This patient presents to the ED for concern of SOB, this involves a number of treatment options, and is a complaint that carries with it a high  risk of complications and morbidity. A differential diagnosis was considered for the patient's symptoms which is discussed below:   The causes for shortness of breath include but are not limited to Cardiac (AHF, pericardial effusion and tamponade, arrhythmias, ischemia, etc) Respiratory (COPD, asthma, pneumonia, pneumothorax, primary pulmonary hypertension, PE/VQ mismatch) Hematological (anemia) Neuromuscular (ALS, Guillain-Barr, etc)    Co morbidities: Discussed in HPI   Brief History:   Patient is a 52 year old male with past medical history significant for COPD, still daily smoker, polysubstance abuse, alcohol use disorder, bipolar  He presents emergency room today with complaints of dyspnea.  Upon EMS arrival patient was hypoxic 84% he is not on oxygen chronically.  Patient was discharged from [facility] 5/21 with diagnosis of pneumonia he was started on Augmentin and discharged home with 4 additional days of Augmentin and prednisone he states he has been taking his medications.   He has been coughing frequently, no hemoptysis or fevers at home. He continues to smoke and seems that he used cocaine and drank alcohol tonight.  Patient  was transported by EMS and given 2 g of magnesium and route as well as 10 mg of albuterol and 1 of of Atrovent.  Solu-Medrol 125 was given and patient was initiated on BiPAP by EMS.    EMR reviewed including pt PMHx, past surgical history and past visits to ER.   See HPI for more details   Lab Tests:   I ordered and independently interpreted labs. Labs notable for COVID PCR negative, ethanol undetectable, BMP unremarkable, CBC with leukocytosis with left shift, VBG without hypercarbia, BNP within normal limits.  Imaging Studies:  Chest x-ray personally viewed by me no focal infiltrate.  I reviewed x-ray done at [facility] as well.    Cardiac Monitoring:  The patient was maintained on a cardiac monitor.  I personally viewed and interpreted the cardiac monitored which showed an underlying rhythm of: Sinus tachycardia EKG non-ischemic   Medicines ordered:  I ordered medication including Tylenol, Augmentin, IM epinephrine, Ativan IV, albuterol for treatment of COPD exacerbation Reevaluation of the patient after these medicines showed that the patient improved I have reviewed the patients home medicines and have made adjustments as needed   Critical Interventions:   epinephrine to prevent intubation   Consults/Attending Physician   I discussed this case with my attending physician who cosigned this note including patient's presenting symptoms, physical exam, and planned diagnostics and interventions. Attending physician stated agreement with plan or made changes to plan which were implemented.   Attending physician assessed patient at bedside.    Discussed with Nestor Lewandowsky who will place patient on a waiting list to admit in the morning   Reevaluation:  After the interventions noted above I re-evaluated patient and found that they have :improved   Social Determinants of Health:  The patient's social determinants of health were a factor in the care of this  patient    Problem List / ED Course:  COPD exacerbation in setting of cocaine use and continued smoking.  Required BiPAP and epinephrine given severe dyspnea. Was diagnosed with pneumonia recently and will restart Augmentin since he has not taken this in at least 24 hours. Polysubstance abuse   Dispostion:  After consideration of the diagnostic results and the patients response to treatment, I feel that the patent would benefit from admission  Final Clinical Impression(s) / ED Diagnoses Final diagnoses:  COPD exacerbation (HCC)    Rx / DC Orders ED Discharge Orders  None         Solon Augusta [place], [place] 05/28/22 0458    Dione Booze, MD 05/28/22 [phone]

## 2022-05-28 NOTE — ED Triage Notes (Signed)
Pt with COPD and has increased work of breathing upon EMS arrival. Pt admits to drinking heavy ETOH and smoking crack tonight. O2 sat 84% upon EMS arival. Pt recently dx with PNA and prescribed Amoxicillin, prednisone and albuterol. Pt given Mag+ 2g IV; Albuterol x 2; Atrovent x 1 neb; Solumedrol 125 IV prior to arrival with little relief. DR Preston Fleeting and resp at bedsdie upon arrival and pt placed on Bipap

## 2022-05-28 NOTE — TOC Initial Note (Addendum)
Transition of Care [facility]) - Initial/Assessment Note    Patient Details  Name: Franklin [name] MRN: [id number] Date of Birth: 1970/02/20  Transition [facility]) CM/SW Contact:    Ivannah Zody A [place], Theresia Majors Phone Number: 05/28/2022, 4:33 PM  Clinical Narrative:                  CSW met with pt at bedside.   He stated that he has been living in his car at his mothers house for about the last two months since he passed. 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  He said her house is being sold in the next few weeks and he can receive financial assistance from the sale.   Pt reports use of crack cocaine and alcohol. He stated he started using when his "son was killed two years ago."   Substance use education and resources provided.   Pt stated that he has a history of psychiatric diagnoses and reported having bipolar disorder and history of depression.   He said that he uses Gabapentin, Seroquel and Adderall to assist with mood regulation and anxiety.   Pt is interested in [facility] in [place]. Referral to be sent to facility to assist with admission.   He said that can get his medications for free or reduced cost at the outpatient pharmacy at [facility].   CSW provided pt with resources for DSS [facility] to assist with  Medicaid application and food stamps and Medicaid transportation.  Pt will need assistance with transportation at discharge.   TOC will continue to follow.   Expected Discharge Plan: IP Rehab Facility Barriers to Discharge: Continued Medical Work up (Inpatient rehab referral, pending admission)   Patient Goals and CMS Choice Patient states their goals for this hospitalization and ongoing recovery are:: Get to rehab          Expected Discharge Plan and Services       Living arrangements for the past 2 months: Homeless (Pt living in personal vehicle)                                      Prior  Living Arrangements/Services Living arrangements for the past 2 months: Homeless (Pt living in personal vehicle) Lives with:: Self          Need for Family Participation in Patient Care: No (Comment) Care giver support system in place?: No (comment)   Criminal Activity/Legal Involvement Pertinent to Current Situation/Hospitalization: No - Comment as needed  Activities of Daily Living Home Assistive Devices/Equipment: None ADL Screening (condition at time of admission) Patient's cognitive ability adequate to safely complete daily activities?: Yes Is the patient deaf or have difficulty hearing?: No Does the patient have difficulty seeing, even when wearing glasses/contacts?: No Does the patient have difficulty concentrating, remembering, or making decisions?: Yes Patient able to express need for assistance with ADLs?: Yes Does the patient have difficulty dressing or bathing?: No Independently performs ADLs?: Yes (appropriate for developmental age) Does the patient have difficulty walking or climbing stairs?: No Weakness of Legs: Both (generalized) Weakness of Arms/Hands: None  Permission Sought/Granted                  Emotional Assessment Appearance:: Appears stated age Attitude/Demeanor/Rapport: Engaged Affect (typically observed): Restless Orientation: : Oriented to Self, Oriented to Place, Oriented to  Time, Oriented to Situation  Alcohol / Substance Use: Illicit Drugs, Alcohol Use Psych Involvement: No (comment)  Admission diagnosis:  COPD exacerbation (HCC) [J44.1] COPD with acute exacerbation (HCC) [J44.1] Patient Active Problem List   Diagnosis Date Noted   COPD with acute exacerbation (HCC) 05/28/2022   COPD exacerbation (HCC) 05/24/2022   Aspiration pneumonia (HCC) 05/24/2022   Alcohol abuse with alcohol-induced mood disorder (HCC) 02/02/2016   Generalized anxiety disorder    Substance abuse (HCC) 03/22/2015   Tobacco use disorder 10/06/2014   Major  depressive disorder, recurrent episode, severe, with psychotic behavior (HCC) 10/05/2014   Cocaine use disorder, severe, dependence (HCC) 10/02/2014   Major depressive disorder, recurrent, severe without psychotic features (HCC)    Alcohol use disorder, severe, dependence (HCC) 06/18/2014   Substance induced mood disorder (HCC) 03/14/2013   Bipolar I disorder, most recent episode depressed (HCC) 04/08/2012   Polysubstance abuse (HCC) 04/08/2012   PCP:  Patient, No Pcp Per Pharmacy:   [facility] [address] [place] [address] Phone: [phone] Fax: [phone]     Social Determinants of Health (SDOH) Social History: SDOH Screenings   Housing: Patient Declined (05/24/2022)  Tobacco Use: High Risk (05/28/2022)   SDOH Interventions:     Readmission Risk Interventions     No data to display

## 2022-05-28 NOTE — ED Notes (Signed)
Provider at bedside

## 2022-05-28 NOTE — ED Notes (Addendum)
ED TO INPATIENT HANDOFF REPORT  ED Nurse Name and Phone #: Lenell Antu Name/Age/Gender Franklin [name] 52 y.o. male Room/Bed: RESUSC/RESUSC  Code Status   Code Status: Full Code  Home/SNF/Other Home (homeless) Patient oriented to: self, place, time, and situation Is this baseline? Yes   Triage Complete: Triage complete  Chief Complaint COPD with acute exacerbation (HCC) [J44.1]  Triage Note Pt with COPD and has increased work of breathing upon EMS arrival. Pt admits to drinking heavy ETOH and smoking crack tonight. O2 sat 84% upon EMS arival. Pt recently dx with PNA and prescribed Amoxicillin, prednisone and albuterol. Pt given Mag+ 2g IV; Albuterol x 2; Atrovent x 1 neb; Solumedrol 125 IV prior to arrival with little relief. DR Preston Fleeting and resp at bedsdie upon arrival and pt placed on Bipap   Allergies No Known Allergies  Level of Care/Admitting Diagnosis ED Disposition     ED Disposition  Admit   Condition  --   Comment  Hospital Area: [facility] [[id number]]  Level of Care: Med-Surg [16]  May place patient in observation at [name] or [facility] if equivalent level of care is available:: Yes  Covid Evaluation: Confirmed COVID Negative  Diagnosis: COPD with acute exacerbation [facility]) [[id number]]  Admitting Physician: Narda Bonds [phone]  Attending Physician: Narda Bonds [phone]          B Medical/Surgery History Past Medical History:  Diagnosis Date   Anxiety    Bipolar 1 disorder (HCC)    COPD (chronic obstructive pulmonary disease) (HCC)    Depression    No pertinent past medical history    Schizophrenia (HCC)    Substance abuse (HCC)    Past Surgical History:  Procedure Laterality Date   NO PAST SURGERIES       A IV Location/Drains/Wounds Patient Lines/Drains/Airways Status     Active Line/Drains/Airways     Name Placement date Placement time Site Days   Peripheral IV 05/28/22 18 G Anterior;Proximal;Right Forearm 05/28/22  0003   Forearm  less than 1   Peripheral IV 05/28/22 18 G Anterior;Left Forearm 05/28/22  0034  Forearm  less than 1            Intake/Output Last 24 hours No intake or output data in the 24 hours ending 05/28/22 1914  Labs/Imaging Results for orders placed or performed during the hospital encounter of 05/28/22 (from the past 48 hour(s))  Brain natriuretic peptide     Status: None   Collection Time: 05/28/22 12:17 AM  Result Value Ref Range   B Natriuretic Peptide 40.1 0.0 - 100.0 pg/mL    Comment: Performed at [facility], [address], [place], [place] [address]  I-Stat venous blood gas, [facility] ED, [facility], [facility])     Status: Abnormal   Collection Time: 05/28/22 12:23 AM  Result Value Ref Range   pH, Ven 7.350 7.25 - 7.43   pCO2, Ven 41.7 (L) 44 - 60 mmHg   pO2, Ven 111 (H) 32 - 45 mmHg   Bicarbonate 23.1 20.0 - 28.0 mmol/L   TCO2 24 22 - 32 mmol/L   O2 Saturation 98 %   Acid-base deficit 3.0 (H) 0.0 - 2.0 mmol/L   Sodium 138 135 - 145 mmol/L   Potassium 3.7 3.5 - 5.1 mmol/L   Calcium, Ion 1.08 (L) 1.15 - 1.40 mmol/L   HCT 43.0 39.0 - 52.0 %   Hemoglobin 14.6 13.0 - 17.0 g/dL   Sample type VENOUS  I-stat chem 8, ED (not at [facility], [facility] or [facility])     Status: Abnormal   Collection Time: 05/28/22 12:28 AM  Result Value Ref Range   Sodium 139 135 - 145 mmol/L   Potassium 4.2 3.5 - 5.1 mmol/L   Chloride 104 98 - 111 mmol/L   BUN 20 6 - 20 mg/dL   Creatinine, Ser 1.61 0.61 - 1.24 mg/dL   Glucose, Bld 096 (H) 70 - 99 mg/dL    Comment: Glucose reference range applies only to samples taken after fasting for at least 8 hours.   Calcium, Ion 1.08 (L) 1.15 - 1.40 mmol/L   TCO2 27 22 - 32 mmol/L   Hemoglobin 14.3 13.0 - 17.0 g/dL   HCT 04.5 40.9 - 81.1 %  Basic metabolic panel     Status: Abnormal   Collection Time: 05/28/22  3:05 AM  Result Value Ref Range   Sodium 137 135 - 145 mmol/L   Potassium 3.6 3.5 - 5.1 mmol/L   Chloride 102 98 - 111 mmol/L   CO2 23 22 - 32 mmol/L    Glucose, Bld 123 (H) 70 - 99 mg/dL    Comment: Glucose reference range applies only to samples taken after fasting for at least 8 hours.   BUN 11 6 - 20 mg/dL   Creatinine, Ser 9.14 0.61 - 1.24 mg/dL   Calcium 8.6 (L) 8.9 - 10.3 mg/dL   GFR, Estimated >78 >29 mL/min    Comment: (NOTE) Calculated using the CKD-EPI Creatinine Equation (2021)    Anion gap 12 5 - 15    Comment: Performed at [facility], [address], [place], [place] [address]  Ethanol     Status: None   Collection Time: 05/28/22  3:05 AM  Result Value Ref Range   Alcohol, Ethyl (B) <10 <10 mg/dL    Comment: (NOTE) Lowest detectable limit for serum alcohol is 10 mg/dL.  For medical purposes only. Performed at [facility], [address] [place], [place] [address]   CBC with Differential/Platelet     Status: Abnormal   Collection Time: 05/28/22  3:05 AM  Result Value Ref Range   WBC 12.1 (H) 4.0 - 10.5 K/uL   RBC 4.37 4.22 - 5.81 MIL/uL   Hemoglobin 13.3 13.0 - 17.0 g/dL   HCT 84.6 96.2 - 95.2 %   MCV 90.8 80.0 - 100.0 fL   MCH 30.4 26.0 - 34.0 pg   MCHC 33.5 30.0 - 36.0 g/dL   RDW 84.1 32.4 - 40.1 %   Platelets 302 150 - 400 K/uL   nRBC 0.0 0.0 - 0.2 %   Neutrophils Relative % 91 %   Neutro Abs 11.1 (H) 1.7 - 7.7 K/uL   Lymphocytes Relative 6 %   Lymphs Abs 0.7 0.7 - 4.0 K/uL   Monocytes Relative 1 %   Monocytes Absolute 0.1 0.1 - 1.0 K/uL   Eosinophils Relative 1 %   Eosinophils Absolute 0.1 0.0 - 0.5 K/uL   Basophils Relative 1 %   Basophils Absolute 0.1 0.0 - 0.1 K/uL   Immature Granulocytes 0 %   Abs Immature Granulocytes 0.04 0.00 - 0.07 K/uL    Comment: Performed at [facility], [address], [place], [place] [address]  SARS Coronavirus 2 by RT PCR (hospital order, performed in [facility] hospital lab) *cepheid single result test* Anterior Nasal Swab     Status: None   Collection Time: 05/28/22  3:12 AM  Specimen: Anterior Nasal Swab  Result Value Ref Range   SARS  Coronavirus 2 by RT PCR NEGATIVE NEGATIVE    Comment: Performed at [facility], [address] [place], [place] [address]   DG Chest Portable 1 View  Result Date: 05/28/2022 CLINICAL DATA:  Shortness of breath EXAM: PORTABLE CHEST 1 VIEW COMPARISON:  Radiograph 05/24/2022 FINDINGS: Hyperinflation and chronic bronchitic change. No focal consolidation, pleural effusion, or pneumothorax. No displaced rib fractures. Stable cardiomediastinal silhouette. IMPRESSION: No active disease.  Emphysema. Electronically Signed   By: Minerva Fester M.D.   On: 05/28/2022 00:40    Pending Labs Unresulted Labs (From admission, onward)     Start     Ordered   06/04/22 0500  Creatinine, serum  (enoxaparin (LOVENOX)    CrCl >/= 30 ml/min)  Weekly,   R     Comments: while on enoxaparin therapy    05/28/22 0707   05/29/22 0500  CBC  Tomorrow morning,   R        05/28/22 0707   05/29/22 0500  Comprehensive metabolic panel  Tomorrow morning,   R        05/28/22 0707   05/28/22 0710  Rapid urine drug screen (hospital performed)  ONCE - STAT,   STAT        05/28/22 0709   05/28/22 0707  CBC  (enoxaparin (LOVENOX)    CrCl >/= 30 ml/min)  Once,   R       Comments: Baseline for enoxaparin therapy IF NOT ALREADY DRAWN.  Notify MD if PLT < 100 K.    05/28/22 0707   05/28/22 0707  Creatinine, serum  (enoxaparin (LOVENOX)    CrCl >/= 30 ml/min)  Once,   R       Comments: Baseline for enoxaparin therapy IF NOT ALREADY DRAWN.    05/28/22 0707   05/28/22 0705  Magnesium  Once,   STAT        05/28/22 0705   05/28/22 0705  Phosphorus  Once,   STAT        05/28/22 0705            Vitals/Pain Today's Vitals   05/28/22 0347 05/28/22 0433 05/28/22 0535 05/28/22 0606  BP: (!) 150/93 (!) 143/76 129/75   Pulse: 91 98 98   Resp: 20 19 (!) 21   Temp: 98.2 F (36.8 C)  98 F (36.7 C)   TempSrc: Oral  Axillary   SpO2: 100% 93% 95%   PainSc: 0-No pain 0-No pain 0-No pain Asleep    Isolation  Precautions Airborne and Contact precautions  Medications Medications  azithromycin (ZITHROMAX) 500 mg in sodium chloride 0.9 % 250 mL IVPB (has no administration in time range)  cefTRIAXone (ROCEPHIN) 1 g in sodium chloride 0.9 % 100 mL IVPB (has no administration in time range)  methylPREDNISolone sodium succinate (SOLU-MEDROL) 125 mg/2 mL injection 120 mg (has no administration in time range)  budesonide (PULMICORT) nebulizer solution 0.5 mg (has no administration in time range)  ipratropium-albuterol (DUONEB) 0.5-2.5 (3) MG/3ML nebulizer solution 3 mL (has no administration in time range)  albuterol (PROVENTIL) (2.5 MG/3ML) 0.083% nebulizer solution 2.5 mg (has no administration in time range)  LORazepam (ATIVAN) tablet 1-4 mg (has no administration in time range)    Or  LORazepam (ATIVAN) tablet 0.5 mg (has no administration in time range)  thiamine (VITAMIN B1) tablet 100 mg (has no administration in time range)    Or  thiamine (VITAMIN  B1) injection 100 mg (has no administration in time range)  folic acid (FOLVITE) tablet 1 mg (has no administration in time range)  multivitamin with minerals tablet 1 tablet (has no administration in time range)  LORazepam (ATIVAN) tablet 0-4 mg (has no administration in time range)    Followed by  LORazepam (ATIVAN) tablet 0-4 mg (has no administration in time range)  enoxaparin (LOVENOX) injection 40 mg (has no administration in time range)  sodium chloride flush (NS) 0.9 % injection 3 mL (has no administration in time range)  acetaminophen (TYLENOL) tablet 650 mg (has no administration in time range)    Or  acetaminophen (TYLENOL) suppository 650 mg (has no administration in time range)  ondansetron (ZOFRAN) tablet 4 mg (has no administration in time range)    Or  ondansetron (ZOFRAN) injection 4 mg (has no administration in time range)  albuterol (PROVENTIL) (2.5 MG/3ML) 0.083% nebulizer solution (15 mg/hr Nebulization Given 05/28/22 0040)   EPINEPHrine (EPI-PEN) injection 0.3 mg (0.3 mg Intramuscular Given 05/28/22 0104)  LORazepam (ATIVAN) injection 1 mg (1 mg Intravenous Given 05/28/22 0103)  acetaminophen (TYLENOL) tablet 1,000 mg (1,000 mg Oral Given 05/28/22 0420)  amoxicillin-clavulanate (AUGMENTIN) 875-125 MG per tablet 1 tablet (1 tablet Oral Given 05/28/22 0420)    Mobility walks     Focused Assessments Pulmonary Assessment Handoff:  Lung sounds: Bilateral Breath Sounds: Expiratory wheezes L Breath Sounds: Inspiratory wheezes R Breath Sounds: Expiratory wheezes, Inspiratory wheezes O2 Device: Nasal Cannula O2 Flow Rate (L/min): 1 L/min    R Recommendations: See Admitting Provider Note  Report given to:   Additional Notes:

## 2022-05-29 DIAGNOSIS — F1490 Cocaine use, unspecified, uncomplicated: Secondary | ICD-10-CM | POA: Diagnosis present

## 2022-05-29 DIAGNOSIS — Z7951 Long term (current) use of inhaled steroids: Secondary | ICD-10-CM | POA: Diagnosis not present

## 2022-05-29 DIAGNOSIS — R0602 Shortness of breath: Secondary | ICD-10-CM | POA: Diagnosis present

## 2022-05-29 DIAGNOSIS — J441 Chronic obstructive pulmonary disease with (acute) exacerbation: Secondary | ICD-10-CM | POA: Diagnosis present

## 2022-05-29 DIAGNOSIS — F319 Bipolar disorder, unspecified: Secondary | ICD-10-CM | POA: Diagnosis present

## 2022-05-29 DIAGNOSIS — F1020 Alcohol dependence, uncomplicated: Secondary | ICD-10-CM | POA: Diagnosis present

## 2022-05-29 DIAGNOSIS — F4381 Prolonged grief disorder: Secondary | ICD-10-CM | POA: Diagnosis present

## 2022-05-29 DIAGNOSIS — R000 Tachycardia, unspecified: Secondary | ICD-10-CM | POA: Diagnosis present

## 2022-05-29 DIAGNOSIS — F209 Schizophrenia, unspecified: Secondary | ICD-10-CM | POA: Diagnosis present

## 2022-05-29 DIAGNOSIS — J189 Pneumonia, unspecified organism: Secondary | ICD-10-CM | POA: Diagnosis present

## 2022-05-29 DIAGNOSIS — J9601 Acute respiratory failure with hypoxia: Secondary | ICD-10-CM | POA: Diagnosis present

## 2022-05-29 DIAGNOSIS — F17210 Nicotine dependence, cigarettes, uncomplicated: Secondary | ICD-10-CM | POA: Diagnosis present

## 2022-05-29 DIAGNOSIS — Z1152 Encounter for screening for COVID-19: Secondary | ICD-10-CM | POA: Diagnosis not present

## 2022-05-29 DIAGNOSIS — J440 Chronic obstructive pulmonary disease with acute lower respiratory infection: Secondary | ICD-10-CM | POA: Diagnosis present

## 2022-05-29 DIAGNOSIS — F419 Anxiety disorder, unspecified: Secondary | ICD-10-CM | POA: Diagnosis present

## 2022-05-29 DIAGNOSIS — Z79899 Other long term (current) drug therapy: Secondary | ICD-10-CM | POA: Diagnosis not present

## 2022-05-29 LAB — CBC
HCT: 40.6 % (ref 39.0–52.0)
Hemoglobin: 13.2 g/dL (ref 13.0–17.0)
MCH: 29.1 pg (ref 26.0–34.0)
MCHC: 32.5 g/dL (ref 30.0–36.0)
MCV: 89.6 fL (ref 80.0–100.0)
Platelets: 296 10*3/uL (ref 150–400)
RBC: 4.53 MIL/uL (ref 4.22–5.81)
RDW: 14.4 % (ref 11.5–15.5)
WBC: 12.1 10*3/uL — ABNORMAL HIGH (ref 4.0–10.5)
nRBC: 0 % (ref 0.0–0.2)

## 2022-05-29 LAB — COMPREHENSIVE METABOLIC PANEL
ALT: 22 U/L (ref 0–44)
AST: 23 U/L (ref 15–41)
Albumin: 3 g/dL — ABNORMAL LOW (ref 3.5–5.0)
Alkaline Phosphatase: 69 U/L (ref 38–126)
Anion gap: 7 (ref 5–15)
BUN: 14 mg/dL (ref 6–20)
CO2: 24 mmol/L (ref 22–32)
Calcium: 8.5 mg/dL — ABNORMAL LOW (ref 8.9–10.3)
Chloride: 108 mmol/L (ref 98–111)
Creatinine, Ser: 0.86 mg/dL (ref 0.61–1.24)
GFR, Estimated: >60 mL/min (ref >60)
Glucose, Bld: 143 mg/dL — ABNORMAL HIGH (ref 70–99)
Potassium: 3.7 mmol/L (ref 3.5–5.1)
Sodium: 139 mmol/L (ref 135–145)
Total Bilirubin: 0.2 mg/dL — ABNORMAL LOW (ref 0.3–1.2)
Total Protein: 5.8 g/dL — ABNORMAL LOW (ref 6.5–8.1)

## 2022-05-29 MED ORDER — CHLORDIAZEPOXIDE HCL 25 MG PO CAPS
25.0000 mg | ORAL_CAPSULE | Freq: Once | ORAL | Status: AC
  Administered 2022-05-29: 25 mg via ORAL
  Filled 2022-05-29: qty 1

## 2022-05-29 MED ORDER — CHLORDIAZEPOXIDE HCL 25 MG PO CAPS
25.0000 mg | ORAL_CAPSULE | Freq: Three times a day (TID) | ORAL | Status: AC
  Administered 2022-05-30 – 2022-05-31 (×3): 25 mg via ORAL
  Filled 2022-05-29 (×3): qty 1

## 2022-05-29 MED ORDER — CHLORDIAZEPOXIDE HCL 25 MG PO CAPS: 25.0000 mg | ORAL_CAPSULE | Freq: Every day | ORAL | Status: DC

## 2022-05-29 MED ORDER — CHLORDIAZEPOXIDE HCL 25 MG PO CAPS
25.0000 mg | ORAL_CAPSULE | Freq: Four times a day (QID) | ORAL | Status: AC
  Administered 2022-05-29 – 2022-05-30 (×4): 25 mg via ORAL
  Filled 2022-05-29 (×4): qty 1

## 2022-05-29 MED ORDER — CHLORDIAZEPOXIDE HCL 25 MG PO CAPS: 25.0000 mg | ORAL_CAPSULE | ORAL | Status: DC

## 2022-05-29 NOTE — Progress Notes (Signed)
PROGRESS NOTE    Franklin [name]  ZOX:[id number] DOB: 07/02/1970 DOA: 05/28/2022 PCP: Patient, No Pcp Per   Brief Narrative: Franklin [name] is a 52 y.o. male with a history of tobacco use, COPD, substance abuse, bipolar 1 disorder.  Patient presented secondary to shortness of breath with concern for COPD exacerbation requiring BiPAP on presentation to the emergency department.  Patient was recently diagnosed with pneumonia as well currently on antibiotic therapy.  Patient treated with albuterol and Solu-Medrol.  BiPAP transition to supplemental oxygen.  Symptoms improved.   Assessment and Plan:  COPD exacerbation Appears to be mild exacerbation.  Wheezing significantly improved with steroids and albuterol nebulizer treatments. -Transition to prednisone -Continue scheduled DuoNebs -Continue azithromycin  Recent pneumonia Present prior to admission.  Patient was not hearing with outpatient antibiotics and has not completed therapy. -Continue Augmentin   Acute respiratory failure with hypoxia Respiratory distress Present on presentation requiring BiPAP on and off patient subsequently transitioned to supplemental oxygen via [place] with nasal cannula and now to room air. -Ambulatory pulse ox -Continuous pulse oximetry   Alcohol abuse Patient with a history of alcohol withdrawal in the past.  Patient is interested in quitting. He is hopeful to be able to go to [facility] on discharge.  TOC consulted.  CIWA scores mild to moderately elevated. -Continue CIWA -Librium taper -TOC for potential discharge to [facility]   Tobacco use -Nicotine patch   DVT prophylaxis: Lovenox Code Status:   Code Status: Full Code Family Communication: None at bedside Disposition Plan: Discharge possibly to [facility] for substance abuse rehab if able   Consultants:  None  Procedures:  None  Antimicrobials: Ceftriaxone Augmentin Azithromycin   Subjective: Patient reports no significant issues this  morning but he does state that he had some anxiety issues overnight.  Patient is eager to go to [facility] for rehab.  He is eager to quit alcohol use.  Objective: BP 130/83 (BP Location: Left Arm)   Pulse 83   Temp 98.5 F (36.9 C) (Oral)   Resp 18   Ht 5\' 10"  (1.778 m)   Wt 72 kg   SpO2 94%   BMI 22.78 kg/m   Examination:  General exam: Appears calm and comfortable. Thin appearing. Respiratory system: Clear to auscultation. Respiratory effort normal. Cardiovascular system: S1 & S2 heard, RRR. Gastrointestinal system: Abdomen is nondistended, soft and nontender. Normal bowel sounds heard. Central nervous system: Alert and oriented. Mild upper extremity tremor Musculoskeletal: No edema. No calf tenderness Skin: No cyanosis. No rashes Psychiatry: Judgement and insight appear normal. Mood & affect appropriate.    Data Reviewed: I have personally reviewed following labs and imaging studies  CBC Lab Results  Component Value Date   WBC 12.1 (H) 05/29/2022   RBC 4.53 05/29/2022   HGB 13.2 05/29/2022   HCT 40.6 05/29/2022   MCV 89.6 05/29/2022   MCH 29.1 05/29/2022   PLT 296 05/29/2022   MCHC 32.5 05/29/2022   RDW 14.4 05/29/2022   LYMPHSABS 0.7 05/28/2022   MONOABS 0.1 05/28/2022   EOSABS 0.1 05/28/2022   BASOSABS 0.1 05/28/2022     Last metabolic panel Lab Results  Component Value Date   NA 139 05/29/2022   K 3.7 05/29/2022   CL 108 05/29/2022   CO2 24 05/29/2022   BUN 14 05/29/2022   CREATININE 0.86 05/29/2022   GLUCOSE 143 (H) 05/29/2022   GFRNONAA >60 05/29/2022   GFRAA >60 10/13/2016   CALCIUM 8.5 (L) 05/29/2022   PHOS 2.3 (L) 05/28/2022  PROT 5.8 (L) 05/29/2022   ALBUMIN 3.0 (L) 05/29/2022   BILITOT 0.2 (L) 05/29/2022   ALKPHOS 69 05/29/2022   AST 23 05/29/2022   ALT 22 05/29/2022   ANIONGAP 7 05/29/2022    GFR: Estimated Creatinine Clearance: 102.3 mL/min (by C-G formula based on SCr of 0.86 mg/dL).  Recent Results (from the past 240 hour(s))   SARS Coronavirus 2 by RT PCR (hospital order, performed in [facility] hospital lab) *cepheid single result test* Anterior Nasal Swab     Status: None   Collection Time: 05/20/22  6:45 PM   Specimen: Anterior Nasal Swab  Result Value Ref Range Status   SARS Coronavirus 2 by RT PCR NEGATIVE NEGATIVE Final    Comment: (NOTE) SARS-CoV-2 target nucleic acids are NOT DETECTED.  The SARS-CoV-2 RNA is generally detectable in upper and lower respiratory specimens during the acute phase of infection. The lowest concentration of SARS-CoV-2 viral copies this assay can detect is 250 copies / mL. A negative result does not preclude SARS-CoV-2 infection and should not be used as the sole basis for treatment or other patient management decisions.  A negative result may occur with improper specimen collection / handling, submission of specimen other than nasopharyngeal swab, presence of viral mutation(s) within the areas targeted by this assay, and inadequate number of viral copies (<250 copies / mL). A negative result must be combined with clinical observations, patient history, and epidemiological information.  Fact Sheet for Patients:   RoadLapTop.co.za  Fact Sheet for Healthcare Providers: http://kim-miller.com/  This test is not yet approved or  cleared by the [place] FDA and has been authorized for detection and/or diagnosis of SARS-CoV-2 by FDA under an Emergency Use Authorization (EUA).  This EUA will remain in effect (meaning this test can be used) for the duration of the COVID-19 declaration under Section 564(b)(1) of the Act, 21 U.S.C. section 360bbb-3(b)(1), unless the authorization is terminated or revoked sooner.  Performed at [facility], [address], [place], [place] [address]   SARS Coronavirus 2 by RT PCR (hospital order, performed in [facility] hospital lab) *cepheid single result test* Anterior Nasal  Swab     Status: None   Collection Time: 05/24/22  9:26 AM   Specimen: Anterior Nasal Swab  Result Value Ref Range Status   SARS Coronavirus 2 by RT PCR NEGATIVE NEGATIVE Final    Comment: (NOTE) SARS-CoV-2 target nucleic acids are NOT DETECTED.  The SARS-CoV-2 RNA is generally detectable in upper and lower respiratory specimens during the acute phase of infection. The lowest concentration of SARS-CoV-2 viral copies this assay can detect is 250 copies / mL. A negative result does not preclude SARS-CoV-2 infection and should not be used as the sole basis for treatment or other patient management decisions.  A negative result may occur with improper specimen collection / handling, submission of specimen other than nasopharyngeal swab, presence of viral mutation(s) within the areas targeted by this assay, and inadequate number of viral copies (<250 copies / mL). A negative result must be combined with clinical observations, patient history, and epidemiological information.  Fact Sheet for Patients:   RoadLapTop.co.za  Fact Sheet for Healthcare Providers: http://kim-miller.com/  This test is not yet approved or  cleared by the [place] FDA and has been authorized for detection and/or diagnosis of SARS-CoV-2 by FDA under an Emergency Use Authorization (EUA).  This EUA will remain in effect (meaning this test can be used) for the duration of the COVID-19  declaration under Section 564(b)(1) of the Act, 21 U.S.C. section 360bbb-3(b)(1), unless the authorization is terminated or revoked sooner.  Performed at [facility], [address] [place], [place] [address]   SARS Coronavirus 2 by RT PCR (hospital order, performed in [facility] hospital lab) *cepheid single result test* Anterior Nasal Swab     Status: None   Collection Time: 05/28/22  3:12 AM   Specimen: Anterior Nasal Swab  Result Value Ref Range Status   SARS  Coronavirus 2 by RT PCR NEGATIVE NEGATIVE Final    Comment: Performed at [facility], [address], [place], [place] [address]      Radiology Studies: DG Chest Portable 1 View  Result Date: 05/28/2022 CLINICAL DATA:  Shortness of breath EXAM: PORTABLE CHEST 1 VIEW COMPARISON:  Radiograph 05/24/2022 FINDINGS: Hyperinflation and chronic bronchitic change. No focal consolidation, pleural effusion, or pneumothorax. No displaced rib fractures. Stable cardiomediastinal silhouette. IMPRESSION: No active disease.  Emphysema. Electronically Signed   By: Minerva Fester M.D.   On: 05/28/2022 00:40      LOS: 0 days    Jacquelin Hawking, MD [facility] 05/29/2022, 10:07 AM   If 7PM-7AM, please contact night-coverage [email or web address]

## 2022-05-29 NOTE — Progress Notes (Signed)
Patient refused lab draws this morning. He became agitated with the phlebotomist because he wanted his labs drawn from his PIV. Patient educated on reasons why his PIV can't be used for labs. He stated he wanted to be left alone to sleep. No distress noted. Will continue to monitor.

## 2022-05-29 NOTE — Hospital Course (Signed)
Job Savard is a 52 y.o. male with a history of tobacco use, COPD, substance abuse, bipolar 1 disorder.  Patient presented secondary to shortness of breath with concern for COPD exacerbation requiring BiPAP on presentation to the emergency department.  Patient was recently diagnosed with pneumonia as well currently on antibiotic therapy.  Patient treated with albuterol and Solu-Medrol.  BiPAP transition to supplemental oxygen.  Symptoms improved.

## 2022-05-29 NOTE — Plan of Care (Signed)
  Problem: Education: Goal: Knowledge of General Education information will improve Description: Including pain rating scale, medication(s)/side effects and non-pharmacologic comfort measures Outcome: Progressing Pt has an understanding that he has an admitting dx of COPD exacerbation.  He understands that he will be receiving breathing tx administered by RT.  Per MD's orders pt will be receiving PO abx for his recent hx of PNA.   Problem: Clinical Measurements: Goal: Will remain free from infection Outcome: Progressing S/Sx of infection of infection monitor and assessed q-shift.  Pt has been afebrile thus far.  He is on PO abx for PNA per MD's orders.    Problem: Clinical Measurements: Goal: Respiratory complications will improve Outcome: Progressing Respiratory status monitored and assessed q-shift.  Pt is on room air with O2 saturations at 94-98% and respiration rate of 18 breaths per minute.  Pt is receiving breathing tx for his COPD exacerbation per MD's orders.  He has denied c/o SOB and DOE.   Problem: Activity: Goal: Risk for activity intolerance will decrease Outcome: Progressing Pt is ambulatory and can attend to his ADLs independently.    Problem: Nutrition: Goal: Adequate nutrition will be maintained Outcome: Progressing Pt was observed eating 100% of all his meals and snacks.    Problem: Coping: Goal: Level of anxiety will decrease Outcome: Progressing Pt has a hx of anxiety.  He has denied c/o of anxiousness thus far.   Problem: Elimination: Goal: Will not experience complications related to bowel motility Outcome: Progressing Pt stated his LBM was on 05/29/2022.   Problem: Elimination: Goal: Will not experience complications related to urinary retention Outcome: Progressing Pt has not expressed c/o urinary retention.  He utilizes a urine when he voids.  This Am he voided of urine.   Problem: Pain Managment: Goal: General experience of comfort will  improve Outcome: Progressing Pt has denied pain thus far.   Problem: Safety: Goal: Ability to remain free from injury will improve Outcome: Progressing Pt has remained free from falls thus far.  Instructed pt to utilize RN call light for assistance.  Hourly rounds performed.  Bed in lowest position, locked with two upper side rails engaged.  Belongings and call light within reach.

## 2022-05-30 LAB — PHOSPHORUS: Phosphorus: 3.1 mg/dL (ref 2.5–4.6)

## 2022-05-30 NOTE — Plan of Care (Signed)
Patient AOX4, VSS throughout shift. All meds given on time as ordered. Diminished lungs, IS encouraged. Pt ambulated to bathroom to void. POC maintained, will continue to monitor.  Problem: Education: Goal: Knowledge of General Education information will improve Description: Including pain rating scale, medication(s)/side effects and non-pharmacologic comfort measures Outcome: Progressing   Problem: Health Behavior/Discharge Planning: Goal: Ability to manage health-related needs will improve Outcome: Progressing   Problem: Clinical Measurements: Goal: Ability to maintain clinical measurements within normal limits will improve Outcome: Progressing Goal: Will remain free from infection Outcome: Progressing Goal: Diagnostic test results will improve Outcome: Progressing Goal: Respiratory complications will improve Outcome: Progressing Goal: Cardiovascular complication will be avoided Outcome: Progressing   Problem: Activity: Goal: Risk for activity intolerance will decrease Outcome: Progressing   Problem: Nutrition: Goal: Adequate nutrition will be maintained Outcome: Progressing   Problem: Coping: Goal: Level of anxiety will decrease Outcome: Progressing   Problem: Elimination: Goal: Will not experience complications related to bowel motility Outcome: Progressing Goal: Will not experience complications related to urinary retention Outcome: Progressing   Problem: Pain Managment: Goal: General experience of comfort will improve Outcome: Progressing   Problem: Safety: Goal: Ability to remain free from injury will improve Outcome: Progressing   Problem: Skin Integrity: Goal: Risk for impaired skin integrity will decrease Outcome: Progressing   Problem: Anxiety Goal: LTG: [place] will score less than 5 on the Generalized Anxiety Disorder 7 Scale (GAD-7)  Outcome: Progressing   Problem: OP Depression Goal: LTG: Reduce frequency, intensity, and duration of depression  symptoms so that daily functioning is improved Outcome: Progressing   Problem: Education: Goal: Knowledge of disease or condition will improve Outcome: Progressing Goal: Understanding of discharge needs will improve Outcome: Progressing   Problem: Health Behavior/Discharge Planning: Goal: Ability to identify changes in lifestyle to reduce recurrence of condition will improve Outcome: Progressing Goal: Identification of resources available to assist in meeting health care needs will improve Outcome: Progressing   Problem: Physical Regulation: Goal: Complications related to the disease process, condition or treatment will be avoided or minimized Outcome: Progressing   Problem: Safety: Goal: Ability to remain free from injury will improve Outcome: Progressing   

## 2022-05-30 NOTE — Progress Notes (Signed)
PROGRESS NOTE    Franklin [name]  ZOX:[id number] DOB: November 02, 1970 DOA: 05/28/2022 PCP: Patient, No Pcp Per   Brief Narrative: Franklin [name] is a 52 y.o. male with a history of tobacco use, COPD, substance abuse, bipolar 1 disorder.  Patient presented secondary to shortness of breath with concern for COPD exacerbation requiring BiPAP on presentation to the emergency department.  Patient was recently diagnosed with pneumonia as well currently on antibiotic therapy.  Patient treated with albuterol and Solu-Medrol.  BiPAP transition to supplemental oxygen.  Symptoms improved.   Assessment and Plan:  COPD exacerbation Appears to be mild exacerbation.  Wheezing significantly improved with steroids and albuterol nebulizer treatments. -Continue prednisone -Continue scheduled DuoNebs -Continue azithromycin  Recent pneumonia Present prior to admission.  Patient was not hearing with outpatient antibiotics and has not completed therapy. -Continue Augmentin   Acute respiratory failure with hypoxia Respiratory distress Present on presentation requiring BiPAP on and off patient subsequently transitioned to supplemental oxygen via [place] with nasal cannula and now to room air. -Ambulatory pulse ox -Discontinue continuous pulse oximetry   Alcohol abuse Patient with a history of alcohol withdrawal in the past.  Patient is interested in quitting. He is hopeful to be able to go to [facility] on discharge.  TOC consulted.  CIWA scores mild to moderately elevated. -Continue CIWA -Librium taper -TOC for potential discharge to [facility]   Tobacco use -Nicotine patch   DVT prophylaxis: Lovenox Code Status:   Code Status: Full Code Family Communication: None at bedside Disposition Plan: Discharge possibly to [facility] for substance abuse rehab if able   Consultants:  None  Procedures:  None  Antimicrobials: Ceftriaxone Augmentin Azithromycin   Subjective: No issues noted from overnight.  CIWA  scores low.  Objective: BP (!) 145/75 (BP Location: Left Arm)   Pulse (!) 53   Temp 97.6 F (36.4 C) (Oral)   Resp 18   Ht 5\' 10"  (1.778 m)   Wt 72 kg   SpO2 96%   BMI 22.78 kg/m   Examination:  General exam: Appears calm and comfortable Respiratory system: Respiratory effort normal.   Data Reviewed: I have personally reviewed following labs and imaging studies  CBC Lab Results  Component Value Date   WBC 12.1 (H) 05/29/2022   RBC 4.53 05/29/2022   HGB 13.2 05/29/2022   HCT 40.6 05/29/2022   MCV 89.6 05/29/2022   MCH 29.1 05/29/2022   PLT 296 05/29/2022   MCHC 32.5 05/29/2022   RDW 14.4 05/29/2022   LYMPHSABS 0.7 05/28/2022   MONOABS 0.1 05/28/2022   EOSABS 0.1 05/28/2022   BASOSABS 0.1 05/28/2022     Last metabolic panel Lab Results  Component Value Date   NA 139 05/29/2022   K 3.7 05/29/2022   CL 108 05/29/2022   CO2 24 05/29/2022   BUN 14 05/29/2022   CREATININE 0.86 05/29/2022   GLUCOSE 143 (H) 05/29/2022   GFRNONAA >60 05/29/2022   GFRAA >60 10/13/2016   CALCIUM 8.5 (L) 05/29/2022   PHOS 3.1 05/30/2022   PROT 5.8 (L) 05/29/2022   ALBUMIN 3.0 (L) 05/29/2022   BILITOT 0.2 (L) 05/29/2022   ALKPHOS 69 05/29/2022   AST 23 05/29/2022   ALT 22 05/29/2022   ANIONGAP 7 05/29/2022    GFR: Estimated Creatinine Clearance: 102.3 mL/min (by C-G formula based on SCr of 0.86 mg/dL).  Recent Results (from the past 240 hour(s))  SARS Coronavirus 2 by RT PCR (hospital order, performed in [facility] hospital lab) *cepheid single result  test* Anterior Nasal Swab     Status: None   Collection Time: 05/20/22  6:45 PM   Specimen: Anterior Nasal Swab  Result Value Ref Range Status   SARS Coronavirus 2 by RT PCR NEGATIVE NEGATIVE Final    Comment: (NOTE) SARS-CoV-2 target nucleic acids are NOT DETECTED.  The SARS-CoV-2 RNA is generally detectable in upper and lower respiratory specimens during the acute phase of infection. The lowest concentration of  SARS-CoV-2 viral copies this assay can detect is 250 copies / mL. A negative result does not preclude SARS-CoV-2 infection and should not be used as the sole basis for treatment or other patient management decisions.  A negative result may occur with improper specimen collection / handling, submission of specimen other than nasopharyngeal swab, presence of viral mutation(s) within the areas targeted by this assay, and inadequate number of viral copies (<250 copies / mL). A negative result must be combined with clinical observations, patient history, and epidemiological information.  Fact Sheet for Patients:   RoadLapTop.co.za  Fact Sheet for Healthcare Providers: http://kim-miller.com/  This test is not yet approved or  cleared by the [place] FDA and has been authorized for detection and/or diagnosis of SARS-CoV-2 by FDA under an Emergency Use Authorization (EUA).  This EUA will remain in effect (meaning this test can be used) for the duration of the COVID-19 declaration under Section 564(b)(1) of the Act, 21 U.S.C. section 360bbb-3(b)(1), unless the authorization is terminated or revoked sooner.  Performed at [facility], [address], [place], [place] [address]   SARS Coronavirus 2 by RT PCR (hospital order, performed in [facility] hospital lab) *cepheid single result test* Anterior Nasal Swab     Status: None   Collection Time: 05/24/22  9:26 AM   Specimen: Anterior Nasal Swab  Result Value Ref Range Status   SARS Coronavirus 2 by RT PCR NEGATIVE NEGATIVE Final    Comment: (NOTE) SARS-CoV-2 target nucleic acids are NOT DETECTED.  The SARS-CoV-2 RNA is generally detectable in upper and lower respiratory specimens during the acute phase of infection. The lowest concentration of SARS-CoV-2 viral copies this assay can detect is 250 copies / mL. A negative result does not preclude SARS-CoV-2 infection and  should not be used as the sole basis for treatment or other patient management decisions.  A negative result may occur with improper specimen collection / handling, submission of specimen other than nasopharyngeal swab, presence of viral mutation(s) within the areas targeted by this assay, and inadequate number of viral copies (<250 copies / mL). A negative result must be combined with clinical observations, patient history, and epidemiological information.  Fact Sheet for Patients:   RoadLapTop.co.za  Fact Sheet for Healthcare Providers: http://kim-miller.com/  This test is not yet approved or  cleared by the [place] FDA and has been authorized for detection and/or diagnosis of SARS-CoV-2 by FDA under an Emergency Use Authorization (EUA).  This EUA will remain in effect (meaning this test can be used) for the duration of the COVID-19 declaration under Section 564(b)(1) of the Act, 21 U.S.C. section 360bbb-3(b)(1), unless the authorization is terminated or revoked sooner.  Performed at [facility], [address] [place], [place] [address]   SARS Coronavirus 2 by RT PCR (hospital order, performed in [facility] hospital lab) *cepheid single result test* Anterior Nasal Swab     Status: None   Collection Time: 05/28/22  3:12 AM   Specimen: Anterior Nasal Swab  Result Value Ref Range  Status   SARS Coronavirus 2 by RT PCR NEGATIVE NEGATIVE Final    Comment: Performed at [facility], [address] [place], [place] [address]      Radiology Studies: No results found.    LOS: 1 day    Jacquelin Hawking, MD [facility] 05/30/2022, 12:55 PM   If 7PM-7AM, please contact night-coverage [email or web address]

## 2022-05-31 MED ORDER — AMOXICILLIN-POT CLAVULANATE 875-125 MG PO TABS
1.0000 | ORAL_TABLET | Freq: Two times a day (BID) | ORAL | 0 refills | Status: AC
  Filled 2022-05-31: qty 2, 1d supply, fill #0

## 2022-05-31 MED ORDER — CHLORDIAZEPOXIDE HCL 25 MG PO CAPS
ORAL_CAPSULE | ORAL | 0 refills | Status: AC
  Filled 2022-05-31: qty 3, 2d supply, fill #0

## 2022-05-31 MED ORDER — AZITHROMYCIN 250 MG PO TABS
250.0000 mg | ORAL_TABLET | Freq: Every day | ORAL | 0 refills | Status: AC
  Filled 2022-05-31: qty 1, 1d supply, fill #0

## 2022-05-31 MED ORDER — VITAMIN B-1 100 MG PO TABS
100.0000 mg | ORAL_TABLET | Freq: Every day | ORAL | 0 refills | Status: AC
  Filled 2022-05-31: qty 90, 90d supply, fill #0

## 2022-05-31 MED ORDER — FOLIC ACID 1 MG PO TABS
1.0000 mg | ORAL_TABLET | Freq: Every day | ORAL | 0 refills | Status: AC
  Filled 2022-05-31: qty 90, 90d supply, fill #0

## 2022-05-31 MED ORDER — FLUTICASONE-SALMETEROL 250-50 MCG/ACT IN AEPB
1.0000 | INHALATION_SPRAY | Freq: Two times a day (BID) | RESPIRATORY_TRACT | 0 refills | Status: DC
  Filled 2022-05-31: qty 60, 30d supply, fill #0

## 2022-05-31 MED ORDER — PREDNISONE 20 MG PO TABS
40.0000 mg | ORAL_TABLET | Freq: Every day | ORAL | 0 refills | Status: AC
  Filled 2022-05-31: qty 2, 1d supply, fill #0

## 2022-05-31 MED ORDER — ADULT MULTIVITAMIN W/MINERALS CH
1.0000 | ORAL_TABLET | Freq: Every day | ORAL | 0 refills | Status: AC
  Filled 2022-05-31: qty 90, 90d supply, fill #0

## 2022-05-31 MED ORDER — ALBUTEROL SULFATE HFA 108 (90 BASE) MCG/ACT IN AERS
2.0000 | INHALATION_SPRAY | Freq: Four times a day (QID) | RESPIRATORY_TRACT | 0 refills | Status: DC | PRN
  Filled 2022-05-31: qty 6.7, 25d supply, fill #0

## 2022-05-31 NOTE — Plan of Care (Signed)
Patient Franklin [name], VSS throughout shift. All meds given on time as ordered. Diminished lungs, IS encouraged. Pt ambulated to bathroom to void. POC maintained, will continue to monitor.  Problem: Education: Goal: Knowledge of General Education information will improve Description: Including pain rating scale, medication(s)/side effects and non-pharmacologic comfort measures Outcome: Progressing   Problem: Health Behavior/Discharge Planning: Goal: Ability to manage health-related needs will improve Outcome: Progressing   Problem: Clinical Measurements: Goal: Ability to maintain clinical measurements within normal limits will improve Outcome: Progressing Goal: Will remain free from infection Outcome: Progressing Goal: Diagnostic test results will improve Outcome: Progressing Goal: Respiratory complications will improve Outcome: Progressing Goal: Cardiovascular complication will be avoided Outcome: Progressing   Problem: Activity: Goal: Risk for activity intolerance will decrease Outcome: Progressing   Problem: Nutrition: Goal: Adequate nutrition will be maintained Outcome: Progressing   Problem: Coping: Goal: Level of anxiety will decrease Outcome: Progressing   Problem: Elimination: Goal: Will not experience complications related to bowel motility Outcome: Progressing Goal: Will not experience complications related to urinary retention Outcome: Progressing   Problem: Pain Managment: Goal: General experience of comfort will improve Outcome: Progressing   Problem: Safety: Goal: Ability to remain free from injury will improve Outcome: Progressing   Problem: Skin Integrity: Goal: Risk for impaired skin integrity will decrease Outcome: Progressing   Problem: Anxiety Goal: LTG: [place] will score less than 5 on the Generalized Anxiety Disorder 7 Scale (GAD-7)  Outcome: Progressing   Problem: OP Depression Goal: LTG: Reduce frequency, intensity, and duration of depression  symptoms so that daily functioning is improved Outcome: Progressing   Problem: Education: Goal: Knowledge of disease or condition will improve Outcome: Progressing Goal: Understanding of discharge needs will improve Outcome: Progressing   Problem: Health Behavior/Discharge Planning: Goal: Ability to identify changes in lifestyle to reduce recurrence of condition will improve Outcome: Progressing Goal: Identification of resources available to assist in meeting health care needs will improve Outcome: Progressing   Problem: Physical Regulation: Goal: Complications related to the disease process, condition or treatment will be avoided or minimized Outcome: Progressing   Problem: Safety: Goal: Ability to remain free from injury will improve Outcome: Progressing

## 2022-05-31 NOTE — Discharge Summary (Signed)
Physician Discharge Summary   Patient: Franklin [name] MRN: [id number] DOB: 17-Feb-1970  Admit date:     05/28/2022  Discharge date: 05/31/22  Discharge Physician: Jacquelin Hawking, MD   PCP: Patient, No Pcp Per   Recommendations at discharge:  Follow-up with PCP Follow-up with [facility]  Discharge Diagnoses: Principal Problem:   COPD with acute exacerbation (HCC)  Resolved Problems:   * No resolved hospital problems. *  Hospital Course: Franklin [name] is a 52 y.o. male with a history of tobacco use, COPD, substance abuse, bipolar 1 disorder.  Patient presented secondary to shortness of breath with concern for COPD exacerbation requiring BiPAP on presentation to the emergency department.  Patient was recently diagnosed with pneumonia as well currently on antibiotic therapy.  Patient treated with albuterol and Solu-Medrol.  BiPAP transition to supplemental oxygen.  Symptoms improved.  Assessment and Plan:  COPD exacerbation Appears to be mild exacerbation.  Wheezing significantly improved with steroids and albuterol nebulizer treatments. Recommendation to continue prednisone, breathing treatments and azithromycin, however patient left prior to discharge order and prior to receiving discharge medication.   Recent pneumonia Present prior to admission.  Patient was not adherent with outpatient antibiotics and did not completed therapy prior to admission. Patient resumed on Augmentin during admission with recommendation to continue on discharge, however patient left prior to discharge order and receiving discharge medications.   Acute respiratory failure with hypoxia Respiratory distress Present on presentation requiring BiPAP on and off patient subsequently transitioned to supplemental oxygen via [place] with nasal cannula and now to room air.   Alcohol abuse Patient with a history of alcohol withdrawal in the past.  Patient is interested in quitting. He is hopeful to be able to go to [facility] on  discharge.  TOC consulted.  CIWA scores mild to moderately elevated initially and improved to zero prior to discharge. Librium taper ordered for discharge, however patient did not pick up medications prior to leaving the hospital [facility] gave information for follow-up with [facility]   Tobacco use Nicotine patch provided during admission.   Consultants: None Procedures performed: None  Disposition: Home Diet recommendation: Regular diet   DISCHARGE MEDICATION: Allergies as of 05/31/2022   No Known Allergies      Medication List     TAKE these medications    albuterol 108 (90 Base) MCG/ACT inhaler Commonly known as: VENTOLIN HFA Inhale 2 puffs into the lungs every 6 (six) hours as needed for wheezing or shortness of breath.   amoxicillin-clavulanate 875-125 MG tablet Commonly known as: AUGMENTIN Take 1 tablet by mouth 2 (two) times daily for 1 day. What changed: when to take this   azithromycin 250 MG tablet Commonly known as: ZITHROMAX Take 1 tablet (250 mg total) by mouth daily for 1 day. Start taking on: Jun 01, 2022   chlordiazePOXIDE 25 MG capsule Commonly known as: LIBRIUM Take 1 capsule (25 mg total) by mouth 2 (two) times daily for 1 day, THEN 1 capsule (25 mg total) daily for 1 day. Start taking on: May 31, 2022   fluticasone-salmeterol 250-50 MCG/ACT Aepb Commonly known as: Advair Diskus Inhale 1 puff into the lungs in the morning and at bedtime.   folic acid 1 MG tablet Commonly known as: FOLVITE Take 1 tablet (1 mg total) by mouth daily. Start taking on: Jun 01, 2022   multivitamin with minerals Tabs tablet Take 1 tablet by mouth daily. Start taking on: Jun 01, 2022   predniSONE 20 MG tablet Commonly known as: DELTASONE Take  2 tablets (40 mg total) by mouth daily with breakfast for 1 day.   thiamine 100 MG tablet Commonly known as: Vitamin B-1 Take 1 tablet (100 mg total) by mouth daily. Start taking on: Jun 01, 2022        Discharge Exam: BP  138/75 (BP Location: Right Arm)   Pulse 80   Temp (!) 97.4 F (36.3 C) (Oral)   Resp 18   Ht 5\' 10"  (1.778 m)   Wt 72 kg   SpO2 96%   BMI 22.78 kg/m   General exam: Appears calm and comfortable Respiratory system: Respiratory effort normal. Gastrointestinal system: Abdomen is non-distended Central nervous system: Alert and oriented Psychiatry: Judgement and insight appear normal. Mood & affect appropriate.   Condition at discharge: stable  The results of significant diagnostics from this hospitalization (including imaging, microbiology, ancillary and laboratory) are listed below for reference.   Imaging Studies: DG Chest Portable 1 View  Result Date: 05/28/2022 CLINICAL DATA:  Shortness of breath EXAM: PORTABLE CHEST 1 VIEW COMPARISON:  Radiograph 05/24/2022 FINDINGS: Hyperinflation and chronic bronchitic change. No focal consolidation, pleural effusion, or pneumothorax. No displaced rib fractures. Stable cardiomediastinal silhouette. IMPRESSION: No active disease.  Emphysema. Electronically Signed   By: Minerva Fester M.D.   On: 05/28/2022 00:40   DG Chest Portable 1 View  Result Date: 05/24/2022 CLINICAL DATA:  Shortness of breath EXAM: PORTABLE CHEST 1 VIEW COMPARISON:  Previous studies including the examination of 05/20/2022 FINDINGS: Cardiac size is within normal limits. There is increase in interstitial markings in the parahilar regions and lower lung fields. There is no focal consolidation. There is no pleural effusion or pneumothorax. IMPRESSION: Increased interstitial markings are seen in parahilar regions and lower lung fields suggesting interstitial edema or interstitial pneumonia. Part of this finding may be due to difference in techniques. There is no focal pulmonary consolidation. There is no pleural effusion or pneumothorax. Electronically Signed   By: Ernie Avena M.D.   On: 05/24/2022 09:36   DG Chest 2 View  Result Date: 05/20/2022 CLINICAL DATA:  sob EXAM:  CHEST - 2 VIEW COMPARISON:  May 10, 2022 FINDINGS: The cardiomediastinal silhouette is normal in contour. No pleural effusion. No pneumothorax. No acute pleuroparenchymal abnormality. Visualized abdomen is unremarkable. No acute osseous abnormality noted. IMPRESSION: No acute cardiopulmonary abnormality. Electronically Signed   By: Meda Klinefelter M.D.   On: 05/20/2022 18:31   DG Chest Port 1 View  Result Date: 05/10/2022 CLINICAL DATA:  Shortness of breath EXAM: PORTABLE CHEST 1 VIEW COMPARISON:  05/05/2022 FINDINGS: The heart size and mediastinal contours are within normal limits. Both lungs are clear. The visualized skeletal structures are unremarkable. IMPRESSION: No active disease. Electronically Signed   By: Charlett Nose M.D.   On: 05/10/2022 02:17   DG Chest Portable 1 View  Result Date: 05/05/2022 CLINICAL DATA:  Shortness of breath EXAM: PORTABLE CHEST 1 VIEW COMPARISON:  Radiograph 04/27/2022 FINDINGS: Stable cardiomediastinal silhouette. Hyperinflation. No focal consolidation, pleural effusion, or pneumothorax. No displaced rib fractures. IMPRESSION: No active disease. Electronically Signed   By: Minerva Fester M.D.   On: 05/05/2022 20:04    Microbiology: Results for orders placed or performed during the hospital encounter of 05/28/22  SARS Coronavirus 2 by RT PCR (hospital order, performed in [facility] hospital lab) *cepheid single result test* Anterior Nasal Swab     Status: None   Collection Time: 05/28/22  3:12 AM   Specimen: Anterior Nasal Swab  Result Value Ref Range Status  SARS Coronavirus 2 by RT PCR NEGATIVE NEGATIVE Final    Comment: Performed at [facility], [address], [place], [place] [address]    Labs: CBC: Recent Labs  Lab 05/24/22 [id number] 05/24/22 1421 05/28/22 0023 05/28/22 0028 05/28/22 0305 05/28/22 0942 05/29/22 0708  WBC 6.3 7.7  --   --  12.1* 8.9 12.1*  NEUTROABS 3.6  --   --   --  11.1*  --   --   HGB 15.2 14.1 14.6 14.3 13.3 13.5 13.2   HCT 48.3 43.9 43.0 42.0 39.7 41.0 40.6  MCV 95.3 94.0  --   --  90.8 89.7 89.6  PLT 331 300  --   --  302 302 296   Basic Metabolic Panel: Recent Labs  Lab 05/24/22 0926 05/24/22 1421 05/28/22 0023 05/28/22 0028 05/28/22 0305 05/28/22 0942 05/29/22 0708 05/30/22 0921  NA 140  --  138 139 137  --  139  --   K 4.3  --  3.7 4.2 3.6  --  3.7  --   CL 106  --   --  104 102  --  108  --   CO2 27  --   --   --  23  --  24  --   GLUCOSE 138*  --   --  103* 123*  --  143*  --   BUN 12  --   --  20 11  --  14  --   CREATININE 0.92 0.86  --  0.80 0.77 0.88 0.86  --   CALCIUM 9.1  --   --   --  8.6*  --  8.5*  --   MG  --   --   --   --   --  2.2  --   --   PHOS  --   --   --   --   --  2.3*  --  3.1   Liver Function Tests: Recent Labs  Lab 05/29/22 0708  AST 23  ALT 22  ALKPHOS 69  BILITOT 0.2*  PROT 5.8*  ALBUMIN 3.0*    Discharge time spent: 35 minutes.  Signed: Jacquelin Hawking, MD [facility] 05/31/2022

## 2022-05-31 NOTE — Progress Notes (Signed)
This nurse went to round on patient. Patient no longer in room. Patient had previously stated his ride was here and he had to leave before 12 because his mother drove here from [place]. MD paged.

## 2022-05-31 NOTE — TOC Transition Note (Signed)
Transition of Care [facility]) - CM/SW Discharge Note   Patient Details  Name: Franklin [name] MRN: [id number] Date of Birth: 06-03-1970  [facility] CM/SW Contact:  Carmina Miller, LCSWA Phone Number: 05/31/2022, 10:41 AM   Clinical Narrative:     [name] address placed on AVS, CSW sent email to Financial Counsel[employer] requesting pt be screened for Medicaid.   Final next level of care:  (homeless, living in personal vehicle) Barriers to Discharge: Continued Medical Work up (Inpatient rehab referral, pending admission)   Patient Goals and CMS Choice      Discharge Placement                         Discharge Plan and Services Additional resources added to the After Visit Summary for                                       Social Determinants of Health (SDOH) Interventions SDOH Screenings   Food Insecurity: Food Insecurity Present (05/28/2022)  Housing: Patient Declined (05/28/2022)  Transportation Needs: Unmet Transportation Needs (05/28/2022)  Utilities: Patient Declined (05/28/2022)  Tobacco Use: High Risk (05/28/2022)     Readmission Risk Interventions     No data to display

## 2022-06-01 ENCOUNTER — Other Ambulatory Visit (HOSPITAL_COMMUNITY): Payer: Self-pay

## 2022-06-18 ENCOUNTER — Emergency Department (HOSPITAL_COMMUNITY): Payer: Medicaid Other

## 2022-06-18 ENCOUNTER — Inpatient Hospital Stay (HOSPITAL_COMMUNITY)
Admission: EM | Admit: 2022-06-18 | Discharge: 2022-06-21 | DRG: 191 | Disposition: A | Discharge status: Home / Self Care | Payer: Medicaid Other | Attending: Family Medicine | Admitting: Family Medicine

## 2022-06-18 DIAGNOSIS — F209 Schizophrenia, unspecified: Secondary | ICD-10-CM | POA: Diagnosis present

## 2022-06-18 DIAGNOSIS — F17200 Nicotine dependence, unspecified, uncomplicated: Secondary | ICD-10-CM | POA: Diagnosis present

## 2022-06-18 DIAGNOSIS — J441 Chronic obstructive pulmonary disease with (acute) exacerbation: Secondary | ICD-10-CM | POA: Diagnosis present

## 2022-06-18 DIAGNOSIS — Z79899 Other long term (current) drug therapy: Secondary | ICD-10-CM | POA: Diagnosis not present

## 2022-06-18 DIAGNOSIS — Z7951 Long term (current) use of inhaled steroids: Secondary | ICD-10-CM | POA: Diagnosis not present

## 2022-06-18 DIAGNOSIS — F419 Anxiety disorder, unspecified: Secondary | ICD-10-CM | POA: Diagnosis present

## 2022-06-18 DIAGNOSIS — F319 Bipolar disorder, unspecified: Secondary | ICD-10-CM | POA: Diagnosis present

## 2022-06-18 DIAGNOSIS — F17210 Nicotine dependence, cigarettes, uncomplicated: Secondary | ICD-10-CM | POA: Diagnosis present

## 2022-06-18 DIAGNOSIS — R0603 Acute respiratory distress: Secondary | ICD-10-CM | POA: Diagnosis present

## 2022-06-18 DIAGNOSIS — Z5900 Homelessness unspecified: Secondary | ICD-10-CM

## 2022-06-18 DIAGNOSIS — I472 Ventricular tachycardia, unspecified: Secondary | ICD-10-CM | POA: Diagnosis not present

## 2022-06-18 DIAGNOSIS — J449 Chronic obstructive pulmonary disease, unspecified: Secondary | ICD-10-CM | POA: Diagnosis present

## 2022-06-18 DIAGNOSIS — F1010 Alcohol abuse, uncomplicated: Secondary | ICD-10-CM | POA: Diagnosis present

## 2022-06-18 DIAGNOSIS — Z720 Tobacco use: Secondary | ICD-10-CM

## 2022-06-18 DIAGNOSIS — F1410 Cocaine abuse, uncomplicated: Secondary | ICD-10-CM | POA: Diagnosis present

## 2022-06-18 DIAGNOSIS — R008 Other abnormalities of heart beat: Secondary | ICD-10-CM | POA: Diagnosis not present

## 2022-06-18 LAB — CBC WITH DIFFERENTIAL/PLATELET
Abs Immature Granulocytes: 0.02 10*3/uL (ref 0.00–0.07)
Basophils Absolute: 0.1 10*3/uL (ref 0.0–0.1)
Basophils Relative: 2 %
Eosinophils Absolute: 0.8 10*3/uL — ABNORMAL HIGH (ref 0.0–0.5)
Eosinophils Relative: 10 %
HCT: 42.4 % (ref 39.0–52.0)
Hemoglobin: 14.1 g/dL (ref 13.0–17.0)
Immature Granulocytes: 0 %
Lymphocytes Relative: 37 %
Lymphs Abs: 2.7 10*3/uL (ref 0.7–4.0)
MCH: 30.1 pg (ref 26.0–34.0)
MCHC: 33.3 g/dL (ref 30.0–36.0)
MCV: 90.6 fL (ref 80.0–100.0)
Monocytes Absolute: 0.5 10*3/uL (ref 0.1–1.0)
Monocytes Relative: 7 %
Neutro Abs: 3.2 10*3/uL (ref 1.7–7.7)
Neutrophils Relative %: 44 %
Platelets: 313 10*3/uL (ref 150–400)
RBC: 4.68 MIL/uL (ref 4.22–5.81)
RDW: 14.6 % (ref 11.5–15.5)
WBC: 7.3 10*3/uL (ref 4.0–10.5)
nRBC: 0 % (ref 0.0–0.2)

## 2022-06-18 LAB — I-STAT VENOUS BLOOD GAS, ED
Acid-Base Excess: 3 mmol/L — ABNORMAL HIGH (ref 0.0–2.0)
Bicarbonate: 30.5 mmol/L — ABNORMAL HIGH (ref 20.0–28.0)
Calcium, Ion: 1.1 mmol/L — ABNORMAL LOW (ref 1.15–1.40)
HCT: 44 % (ref 39.0–52.0)
Hemoglobin: 15 g/dL (ref 13.0–17.0)
O2 Saturation: 99 %
Potassium: 5.1 mmol/L (ref 3.5–5.1)
Sodium: 140 mmol/L (ref 135–145)
TCO2: 32 mmol/L (ref 22–32)
pCO2, Ven: 57.9 mmHg (ref 44–60)
pH, Ven: 7.329 (ref 7.25–7.43)
pO2, Ven: 149 mmHg — ABNORMAL HIGH (ref 32–45)

## 2022-06-18 LAB — BASIC METABOLIC PANEL
Anion gap: 12 (ref 5–15)
BUN: 11 mg/dL (ref 6–20)
CO2: 25 mmol/L (ref 22–32)
Calcium: 9 mg/dL (ref 8.9–10.3)
Chloride: 103 mmol/L (ref 98–111)
Creatinine, Ser: 1 mg/dL (ref 0.61–1.24)
GFR, Estimated: >60 mL/min (ref >60)
Glucose, Bld: 124 mg/dL — ABNORMAL HIGH (ref 70–99)
Potassium: 5.2 mmol/L — ABNORMAL HIGH (ref 3.5–5.1)
Sodium: 140 mmol/L (ref 135–145)

## 2022-06-18 LAB — BRAIN NATRIURETIC PEPTIDE: B Natriuretic Peptide: 23.4 pg/mL (ref 0.0–100.0)

## 2022-06-18 MED ORDER — ALBUTEROL SULFATE (2.5 MG/3ML) 0.083% IN NEBU
15.0000 mg | INHALATION_SOLUTION | Freq: Once | RESPIRATORY_TRACT | Status: AC
  Administered 2022-06-18: 15 mg via RESPIRATORY_TRACT
  Filled 2022-06-18: qty 18

## 2022-06-18 MED ORDER — SODIUM CHLORIDE 0.9 % IV SOLN
500.0000 mg | Freq: Once | INTRAVENOUS | Status: AC
  Administered 2022-06-18: 500 mg via INTRAVENOUS
  Filled 2022-06-18: qty 5

## 2022-06-18 MED ORDER — SODIUM CHLORIDE 0.9 % IV BOLUS
1000.0000 mL | Freq: Once | INTRAVENOUS | Status: AC
  Administered 2022-06-18: 1000 mL via INTRAVENOUS

## 2022-06-18 NOTE — ED Triage Notes (Deleted)
Pt BIB [facility] from home for shortness of breath. On EMS arrival patient was tripoding with accessory muscle use. 94% on room air and received 125mg  solumedrol, 2g mag, 0.3 IM epi, 15mg  albuterol, 1 atrovent. 20 L AC. Bp 150/100, HR 104, RR 30 only speaking one word at a time, up to five at a time between breaths.

## 2022-06-18 NOTE — H&P (Signed)
History and Physical      Franklin [name] ZOX:[id number] DOB: 01-Aug-1970 DOA: 06/18/2022; DOS: 06/18/2022  PCP: Patient, No Pcp Per *** Patient coming from: home ***  I have personally briefly reviewed patient's old medical records in [facility]  Chief Complaint: ***  HPI: Franklin [name] is a 52 y.o. male with medical history significant for *** who is admitted to [facility] on 06/18/2022 with *** after presenting from home*** to [facility] ED complaining of ***.    ***       ***   ED Course:  Vital signs in the ED were notable for the following: ***  Labs were notable for the following: ***  Per my interpretation, EKG in ED demonstrated the following:  ***  Imaging and additional notable ED work-up: ***  While in the ED, the following were administered: ***  Subsequently, the patient was admitted  ***  ***red    Review of Systems: As per HPI otherwise 10 point review of systems negative.   Past Medical History:  Diagnosis Date   Anxiety    Bipolar 1 disorder (HCC)    COPD (chronic obstructive pulmonary disease) (HCC)    Depression    No pertinent past medical history    Schizophrenia (HCC)    Substance abuse (HCC)     Past Surgical History:  Procedure Laterality Date   NO PAST SURGERIES      Social History:  reports that he has been smoking cigarettes. He has been smoking an average of .5 packs per day. He has never used smokeless tobacco. He reports current alcohol use of about 12.0 standard drinks of alcohol per week. He reports current drug use. Drugs: "Crack" cocaine and Cocaine.   No Known Allergies  Family History  Problem Relation Age of Onset   Hypertension Other    Diabetes Other     Family history reviewed and not pertinent ***   Prior to Admission medications   Medication Sig Start Date End Date Taking? Authorizing Provider  albuterol (VENTOLIN HFA) 108 (90 Base) MCG/ACT inhaler Inhale 2 puffs into the lungs every 6 (six)  hours as needed for wheezing or shortness of breath. 05/31/22   Narda Bonds, MD  fluticasone-salmeterol (ADVAIR DISKUS) 250-50 MCG/ACT AEPB Inhale 1 puff into the lungs in the morning and at bedtime. 05/31/22   Narda Bonds, MD  folic acid (FOLVITE) 1 MG tablet Take 1 tablet (1 mg total) by mouth daily. 06/01/22 08/30/22  Narda Bonds, MD  Multiple Vitamin (MULTIVITAMIN WITH MINERALS) TABS tablet Take 1 tablet by mouth daily. 06/01/22 08/30/22  Narda Bonds, MD  thiamine (VITAMIN B-1) 100 MG tablet Take 1 tablet (100 mg total) by mouth daily. 06/01/22 08/30/22  Narda Bonds, MD     Objective    Physical Exam: Vitals:   06/18/22 2225 06/18/22 2237 06/18/22 2300 06/18/22 2315  BP:   (!) 141/83 (!) 147/71  Pulse:   (!) 106 (!) 103  Resp:   19 19  SpO2: 100% 100% 100% 100%    General: appears to be stated age; alert, oriented Skin: warm, dry, no rash Head:  AT/[place] Mouth:  Oral mucosa membranes appear moist, normal dentition Neck: supple; trachea midline Heart:  RRR; did not appreciate any M/R/G Lungs: CTAB, did not appreciate any wheezes, rales, or rhonchi Abdomen: + BS; soft, ND, NT Vascular: 2+ pedal pulses b/l; 2+ radial pulses b/l Extremities: no peripheral edema, no muscle wasting Neuro: strength and sensation  intact in upper and lower extremities b/l ***   *** Neuro: 5/5 strength of the proximal and distal flexors and extensors of the upper and lower extremities bilaterally; sensation intact in upper and lower extremities b/l; cranial nerves II through XII grossly intact; no pronator drift; no evidence suggestive of slurred speech, dysarthria, or facial droop; Normal muscle tone. No tremors.  *** Neuro: In the setting of the patient's current mental status and associated inability to follow instructions, unable to perform full neurologic exam at this time.  As such, assessment of strength, sensation, and cranial nerves is limited at this time. Patient noted to  spontaneously move all 4 extremities. No tremors.  ***    Labs on Admission: I have personally reviewed following labs and imaging studies  CBC: Recent Labs  Lab 06/18/22 2226 06/18/22 2246  WBC 7.3  --   NEUTROABS 3.2  --   HGB 14.1 15.0  HCT 42.4 44.0  MCV 90.6  --   PLT 313  --    Basic Metabolic Panel: Recent Labs  Lab 06/18/22 2226 06/18/22 2246  NA 140 140  K 5.2* 5.1  CL 103  --   CO2 25  --   GLUCOSE 124*  --   BUN 11  --   CREATININE 1.00  --   CALCIUM 9.0  --    GFR: CrCl cannot be calculated (Unknown ideal weight.). Liver Function Tests: No results for input(s): "AST", "ALT", "ALKPHOS", "BILITOT", "PROT", "ALBUMIN" in the last 168 hours. No results for input(s): "LIPASE", "AMYLASE" in the last 168 hours. No results for input(s): "AMMONIA" in the last 168 hours. Coagulation Profile: No results for input(s): "INR", "PROTIME" in the last 168 hours. Cardiac Enzymes: No results for input(s): "CKTOTAL", "CKMB", "CKMBINDEX", "TROPONINI" in the last 168 hours. BNP (last 3 results) No results for input(s): "PROBNP" in the last 8760 hours. HbA1C: No results for input(s): "HGBA1C" in the last 72 hours. CBG: No results for input(s): "GLUCAP" in the last 168 hours. Lipid Profile: No results for input(s): "CHOL", "HDL", "LDLCALC", "TRIG", "CHOLHDL", "LDLDIRECT" in the last 72 hours. Thyroid Function Tests: No results for input(s): "TSH", "T4TOTAL", "FREET4", "T3FREE", "THYROIDAB" in the last 72 hours. Anemia Panel: No results for input(s): "VITAMINB12", "FOLATE", "FERRITIN", "TIBC", "IRON", "RETICCTPCT" in the last 72 hours. Urine analysis:    Component Value Date/Time   COLORURINE YELLOW 03/13/2013 1420   APPEARANCEUR CLEAR 03/13/2013 1420   APPEARANCEUR Hazy 08/25/2012 0925   LABSPEC 1.026 03/13/2013 1420   LABSPEC 1.025 08/25/2012 0925   PHURINE 5.0 03/13/2013 1420   GLUCOSEU NEGATIVE 03/13/2013 1420   GLUCOSEU Negative 08/25/2012 0925   HGBUR  NEGATIVE 03/13/2013 1420   BILIRUBINUR SMALL (A) 03/13/2013 1420   BILIRUBINUR Negative 08/25/2012 0925   KETONESUR NEGATIVE 03/13/2013 1420   PROTEINUR NEGATIVE 03/13/2013 1420   UROBILINOGEN 0.2 03/13/2013 1420   NITRITE NEGATIVE 03/13/2013 1420   LEUKOCYTESUR NEGATIVE 03/13/2013 1420   LEUKOCYTESUR Negative 08/25/2012 0925    Radiological Exams on Admission: DG Chest Port 1 View  Result Date: 06/18/2022 CLINICAL DATA:  Shortness of breath. EXAM: PORTABLE CHEST 1 VIEW COMPARISON:  Chest radiograph dated 05/28/2022. FINDINGS: No focal consolidation, pleural effusion, or pneumothorax. The cardiac silhouette is within normal limits. No acute osseous pathology. IMPRESSION: No active disease. Electronically Signed   By: Elgie Collard M.D.   On: 06/18/2022 23:10      Assessment/Plan   Principal Problem:   Acute exacerbation of chronic obstructive pulmonary disease (COPD) (HCC)   ***       ***            ***                ***               ***               ***               ***                ***               ***               ***               ***               ***              ***          ***  DVT prophylaxis: SCD's ***  Code Status: Full code*** Family Communication: none*** Disposition Plan: Per Rounding Team Consults called: none***;  Admission status: ***     I SPENT GREATER THAN 75 *** MINUTES IN CLINICAL CARE TIME/MEDICAL DECISION-MAKING IN COMPLETING THIS ADMISSION.      Chaney Born Geselle Cardosa DO [facility]  From 7PM - 7AM   06/18/2022, 11:59 PM   ***

## 2022-06-18 NOTE — ED Provider Notes (Signed)
[place] EMERGENCY DEPARTMENT AT [facility] Provider Note   CSN: [id number] Arrival date & time: 06/18/22  2219     History {Add pertinent medical, surgical, social history, OB history to HPI:1} No chief complaint on file.   Franklin [name] is a 52 y.o. male.  PMH of COPD, not on home oxygen, still smokes cigarettes daily.  Brought in by EMS for respiratory distress.  EMS reports significant increased work of breathing on arrival, patient tripoding, he received 100 mg of Solu-Medrol, 50 mg of albuterol, milligram of Atrovent, 2 g of magnesium IV and 0.3 mg of epinephrine IM.  They tried CPAP but patient did not tolerate it well, they state he was initially speaking 1 word sentences now up to about 5, he was satting 94% on room air when they arrived.   HPI     Home Medications Prior to Admission medications   Medication Sig Start Date End Date Taking? Authorizing Provider  albuterol (VENTOLIN HFA) 108 (90 Base) MCG/ACT inhaler Inhale 2 puffs into the lungs every 6 (six) hours as needed for wheezing or shortness of breath. 05/31/22   Narda Bonds, MD  fluticasone-salmeterol (ADVAIR DISKUS) 250-50 MCG/ACT AEPB Inhale 1 puff into the lungs in the morning and at bedtime. 05/31/22   Narda Bonds, MD  folic acid (FOLVITE) 1 MG tablet Take 1 tablet (1 mg total) by mouth daily. 06/01/22 08/30/22  Narda Bonds, MD  Multiple Vitamin (MULTIVITAMIN WITH MINERALS) TABS tablet Take 1 tablet by mouth daily. 06/01/22 08/30/22  Narda Bonds, MD  thiamine (VITAMIN B-1) 100 MG tablet Take 1 tablet (100 mg total) by mouth daily. 06/01/22 08/30/22  Narda Bonds, MD      Allergies    Patient has no known allergies.    Review of Systems   Review of Systems  Physical Exam Updated Vital Signs BP (!) 158/91   Pulse (!) 109   Resp 16   SpO2 100%  Physical Exam Constitutional:      Appearance: He is ill-appearing. He is not diaphoretic.  HENT:     Head: Normocephalic and  atraumatic.     Mouth/Throat:     Mouth: Mucous membranes are moist.  Eyes:     Extraocular Movements: Extraocular movements intact.     Pupils: Pupils are equal, round, and reactive to light.  Cardiovascular:     Rate and Rhythm: Tachycardia present.  Pulmonary:     Effort: Respiratory distress and retractions present.     Breath sounds: Decreased air movement present.  Abdominal:     General: Abdomen is flat.     Palpations: Abdomen is soft.  Musculoskeletal:        General: Normal range of motion.     Cervical back: Normal range of motion.  Skin:    General: Skin is warm and dry.  Neurological:     General: No focal deficit present.     Mental Status: He is alert and oriented to person, place, and time.  Psychiatric:        Mood and Affect: Mood normal.        Behavior: Behavior normal.     ED Results / Procedures / Treatments   Labs (all labs ordered are listed, but only abnormal results are displayed) Labs Reviewed  BASIC METABOLIC PANEL  CBC WITH DIFFERENTIAL/PLATELET  BRAIN NATRIURETIC PEPTIDE  I-STAT VENOUS BLOOD GAS, ED    EKG None  Radiology No results found.  Procedures Procedures  {  Document cardiac monitor, telemetry assessment procedure when appropriate:1}  Medications Ordered in ED Medications  albuterol (PROVENTIL) (2.5 MG/3ML) 0.083% nebulizer solution 15 mg (15 mg Nebulization Given 06/18/22 2232)    ED Course/ Medical Decision Making/ A&P Clinical Course as of 06/18/22 2232  Fri Jun 18, 2022  2231 Presents in respiratory distress, has diminished breath sounds throughout, retractions but no longer tripoding, EMS reports some improvement in his status from when they initially saw him.  He is already had steroids, albuterol, Atrovent, magnesium and IM epinephrine and ran out.  Respiratory at bedside is going to apply BiPAP and start another continuous nebulizer treatment.  Patient is agreeable with BiPAP, has had this in the past.  Anticipate  admission, labs pending at this time. [CB]    Clinical Course User Index [CB] Carmel Sacramento A, PA-C   {   Click here for ABCD2, HEART and other calculatorsREFRESH Note before signing :1}                          Medical Decision Making Amount and/or Complexity of Data Reviewed Labs: ordered. Radiology: ordered.  Risk Prescription drug management.   ***  {Document critical care time when appropriate:1} {Document review of labs and clinical decision tools ie heart score, Chads2Vasc2 etc:1}  {Document your independent review of radiology images, and any outside records:1} {Document your discussion with family members, caretakers, and with consultants:1} {Document social determinants of health affecting pt's care:1} {Document your decision making why or why not admission, treatments were needed:1} Final Clinical Impression(s) / ED Diagnoses Final diagnoses:  None    Rx / DC Orders ED Discharge Orders     None

## 2022-06-18 NOTE — ED Triage Notes (Signed)
Pt BIB [facility] from home for shortness of breath. On EMS arrival patient was tripoding with accessory muscle use. 94% on room air and received 125mg  solumedrol, 2g Mag, 0.3 IM epi, 15mg  albuterol, 1 atrovent. 20 LAC BP 150/100, HR 104, RR 30 only speaking on word at a time, up to five at a time between breaths.

## 2022-06-19 ENCOUNTER — Encounter (HOSPITAL_COMMUNITY): Payer: Self-pay | Admitting: Internal Medicine

## 2022-06-19 LAB — COMPREHENSIVE METABOLIC PANEL
ALT: 22 U/L (ref 0–44)
AST: 25 U/L (ref 15–41)
Albumin: 3.7 g/dL (ref 3.5–5.0)
Alkaline Phosphatase: 63 U/L (ref 38–126)
Anion gap: 16 — ABNORMAL HIGH (ref 5–15)
BUN: 9 mg/dL (ref 6–20)
CO2: 22 mmol/L (ref 22–32)
Calcium: 8.6 mg/dL — ABNORMAL LOW (ref 8.9–10.3)
Chloride: 103 mmol/L (ref 98–111)
Creatinine, Ser: 0.92 mg/dL (ref 0.61–1.24)
GFR, Estimated: >60 mL/min (ref >60)
Glucose, Bld: 131 mg/dL — ABNORMAL HIGH (ref 70–99)
Potassium: 3.5 mmol/L (ref 3.5–5.1)
Sodium: 141 mmol/L (ref 135–145)
Total Bilirubin: 0.7 mg/dL (ref 0.3–1.2)
Total Protein: 6.6 g/dL (ref 6.5–8.1)

## 2022-06-19 LAB — I-STAT VENOUS BLOOD GAS, ED
Acid-base deficit: 1 mmol/L (ref 0.0–2.0)
Bicarbonate: 24 mmol/L (ref 20.0–28.0)
Calcium, Ion: 1.07 mmol/L — ABNORMAL LOW (ref 1.15–1.40)
HCT: 42 % (ref 39.0–52.0)
Hemoglobin: 14.3 g/dL (ref 13.0–17.0)
O2 Saturation: 88 %
Potassium: 3.5 mmol/L (ref 3.5–5.1)
Sodium: 141 mmol/L (ref 135–145)
TCO2: 25 mmol/L (ref 22–32)
pCO2, Ven: 41.6 mmHg — ABNORMAL LOW (ref 44–60)
pH, Ven: 7.368 (ref 7.25–7.43)
pO2, Ven: 56 mmHg — ABNORMAL HIGH (ref 32–45)

## 2022-06-19 LAB — CBC WITH DIFFERENTIAL/PLATELET
Abs Immature Granulocytes: 0.04 10*3/uL (ref 0.00–0.07)
Basophils Absolute: 0.1 10*3/uL (ref 0.0–0.1)
Basophils Relative: 1 %
Eosinophils Absolute: 0.1 10*3/uL (ref 0.0–0.5)
Eosinophils Relative: 1 %
HCT: 41.4 % (ref 39.0–52.0)
Hemoglobin: 13.5 g/dL (ref 13.0–17.0)
Immature Granulocytes: 0 %
Lymphocytes Relative: 5 %
Lymphs Abs: 0.5 10*3/uL — ABNORMAL LOW (ref 0.7–4.0)
MCH: 29.9 pg (ref 26.0–34.0)
MCHC: 32.6 g/dL (ref 30.0–36.0)
MCV: 91.6 fL (ref 80.0–100.0)
Monocytes Absolute: 0.1 10*3/uL (ref 0.1–1.0)
Monocytes Relative: 1 %
Neutro Abs: 9.4 10*3/uL — ABNORMAL HIGH (ref 1.7–7.7)
Neutrophils Relative %: 92 %
Platelets: 316 10*3/uL (ref 150–400)
RBC: 4.52 MIL/uL (ref 4.22–5.81)
RDW: 14.5 % (ref 11.5–15.5)
WBC: 10.3 10*3/uL (ref 4.0–10.5)
nRBC: 0 % (ref 0.0–0.2)

## 2022-06-19 LAB — PROCALCITONIN: Procalcitonin: <0.1 ng/mL

## 2022-06-19 LAB — PHOSPHORUS: Phosphorus: 3 mg/dL (ref 2.5–4.6)

## 2022-06-19 LAB — MAGNESIUM: Magnesium: 2.3 mg/dL (ref 1.7–2.4)

## 2022-06-19 MED ORDER — CHLORDIAZEPOXIDE HCL 25 MG PO CAPS
25.0000 mg | ORAL_CAPSULE | Freq: Four times a day (QID) | ORAL | Status: AC
  Administered 2022-06-19 – 2022-06-20 (×3): 25 mg via ORAL
  Filled 2022-06-19 (×3): qty 1

## 2022-06-19 MED ORDER — NICOTINE 14 MG/24HR TD PT24: 14.0000 mg | MEDICATED_PATCH | Freq: Every day | TRANSDERMAL | Status: DC | PRN

## 2022-06-19 MED ORDER — THIAMINE HCL 100 MG/ML IJ SOLN
100.0000 mg | Freq: Every day | INTRAMUSCULAR | Status: DC
  Filled 2022-06-19: qty 2

## 2022-06-19 MED ORDER — LOPERAMIDE HCL 2 MG PO CAPS: 2.0000 mg | ORAL_CAPSULE | ORAL | Status: DC | PRN

## 2022-06-19 MED ORDER — FOLIC ACID 1 MG PO TABS
1.0000 mg | ORAL_TABLET | Freq: Every day | ORAL | Status: DC
  Administered 2022-06-19 – 2022-06-21 (×3): 1 mg via ORAL
  Filled 2022-06-19 (×3): qty 1

## 2022-06-19 MED ORDER — ADULT MULTIVITAMIN W/MINERALS CH
1.0000 | ORAL_TABLET | Freq: Every day | ORAL | Status: DC
  Administered 2022-06-19: 1 via ORAL
  Filled 2022-06-19: qty 1

## 2022-06-19 MED ORDER — SODIUM CHLORIDE 0.9 % IV SOLN
500.0000 mg | INTRAVENOUS | Status: DC
  Administered 2022-06-19 – 2022-06-20 (×2): 500 mg via INTRAVENOUS
  Filled 2022-06-19 (×2): qty 5

## 2022-06-19 MED ORDER — IPRATROPIUM-ALBUTEROL 0.5-2.5 (3) MG/3ML IN SOLN
3.0000 mL | Freq: Four times a day (QID) | RESPIRATORY_TRACT | Status: DC
  Administered 2022-06-19: 3 mL via RESPIRATORY_TRACT
  Filled 2022-06-19: qty 3

## 2022-06-19 MED ORDER — CHLORDIAZEPOXIDE HCL 25 MG PO CAPS: 25.0000 mg | ORAL_CAPSULE | Freq: Every day | ORAL | Status: DC

## 2022-06-19 MED ORDER — ALBUTEROL SULFATE (2.5 MG/3ML) 0.083% IN NEBU: 2.5000 mg | INHALATION_SOLUTION | RESPIRATORY_TRACT | Status: DC | PRN

## 2022-06-19 MED ORDER — ADULT MULTIVITAMIN W/MINERALS CH
1.0000 | ORAL_TABLET | Freq: Every day | ORAL | Status: DC
  Administered 2022-06-19 – 2022-06-21 (×3): 1 via ORAL
  Filled 2022-06-19 (×3): qty 1

## 2022-06-19 MED ORDER — PREDNISONE 20 MG PO TABS
40.0000 mg | ORAL_TABLET | Freq: Every day | ORAL | Status: DC
  Administered 2022-06-20 – 2022-06-21 (×2): 40 mg via ORAL
  Filled 2022-06-19 (×2): qty 2

## 2022-06-19 MED ORDER — LORAZEPAM 1 MG PO TABS
1.0000 mg | ORAL_TABLET | ORAL | Status: DC | PRN
  Administered 2022-06-19: 1 mg via ORAL

## 2022-06-19 MED ORDER — THIAMINE HCL 100 MG/ML IJ SOLN: 100.0000 mg | Freq: Once | INTRAMUSCULAR | Status: DC

## 2022-06-19 MED ORDER — ONDANSETRON HCL 4 MG/2ML IJ SOLN: 4.0000 mg | Freq: Four times a day (QID) | INTRAMUSCULAR | Status: DC | PRN

## 2022-06-19 MED ORDER — IPRATROPIUM-ALBUTEROL 0.5-2.5 (3) MG/3ML IN SOLN
3.0000 mL | Freq: Two times a day (BID) | RESPIRATORY_TRACT | Status: DC
  Administered 2022-06-21: 3 mL via RESPIRATORY_TRACT
  Filled 2022-06-19 (×3): qty 3

## 2022-06-19 MED ORDER — ACETAMINOPHEN 650 MG RE SUPP: 650.0000 mg | Freq: Four times a day (QID) | RECTAL | Status: DC | PRN

## 2022-06-19 MED ORDER — LORAZEPAM 1 MG PO TABS
1.0000 mg | ORAL_TABLET | ORAL | Status: DC | PRN
  Administered 2022-06-19: 2 mg via ORAL
  Filled 2022-06-19: qty 2
  Filled 2022-06-19: qty 1

## 2022-06-19 MED ORDER — CHLORDIAZEPOXIDE HCL 25 MG PO CAPS
25.0000 mg | ORAL_CAPSULE | Freq: Three times a day (TID) | ORAL | Status: AC
  Administered 2022-06-20 – 2022-06-21 (×3): 25 mg via ORAL
  Filled 2022-06-19 (×3): qty 1

## 2022-06-19 MED ORDER — MELATONIN 3 MG PO TABS
3.0000 mg | ORAL_TABLET | Freq: Every evening | ORAL | Status: DC | PRN
  Administered 2022-06-19: 3 mg via ORAL
  Filled 2022-06-19: qty 1

## 2022-06-19 MED ORDER — CHLORDIAZEPOXIDE HCL 25 MG PO CAPS: 25.0000 mg | ORAL_CAPSULE | ORAL | Status: DC

## 2022-06-19 MED ORDER — IPRATROPIUM-ALBUTEROL 0.5-2.5 (3) MG/3ML IN SOLN
3.0000 mL | Freq: Four times a day (QID) | RESPIRATORY_TRACT | Status: DC
  Filled 2022-06-19: qty 3

## 2022-06-19 MED ORDER — THIAMINE MONONITRATE 100 MG PO TABS
100.0000 mg | ORAL_TABLET | Freq: Every day | ORAL | Status: DC
  Administered 2022-06-19 – 2022-06-21 (×3): 100 mg via ORAL
  Filled 2022-06-19 (×3): qty 1

## 2022-06-19 MED ORDER — ONDANSETRON 4 MG PO TBDP: 4.0000 mg | ORAL_TABLET | Freq: Four times a day (QID) | ORAL | Status: DC | PRN

## 2022-06-19 MED ORDER — METHYLPREDNISOLONE SODIUM SUCC 125 MG IJ SOLR
80.0000 mg | Freq: Two times a day (BID) | INTRAMUSCULAR | Status: DC
  Administered 2022-06-19: 80 mg via INTRAVENOUS
  Filled 2022-06-19: qty 2

## 2022-06-19 MED ORDER — HYDROXYZINE HCL 25 MG PO TABS
25.0000 mg | ORAL_TABLET | Freq: Four times a day (QID) | ORAL | Status: DC | PRN
  Administered 2022-06-19: 25 mg via ORAL
  Filled 2022-06-19: qty 1

## 2022-06-19 MED ORDER — ACETAMINOPHEN 325 MG PO TABS: 650.0000 mg | ORAL_TABLET | Freq: Four times a day (QID) | ORAL | Status: DC | PRN

## 2022-06-19 NOTE — Progress Notes (Signed)
Pt asking for Social worker and [facility] placement if poss.

## 2022-06-19 NOTE — ED Notes (Signed)
ED TO INPATIENT HANDOFF REPORT  ED Nurse Name and Phone #:  Gillis Ends [phone]  S Name/Age/Gender Franklin [name] 52 y.o. male Room/Bed: 017C/017C  Code Status   Code Status: Full Code  Home/SNF/Other Home Patient oriented to: self, place, time, and situation Is this baseline? Yes   Triage Complete: Triage complete  Chief Complaint Acute exacerbation of chronic obstructive pulmonary disease (COPD) (HCC) [J44.1]  Triage Note Pt BIB [facility] from home for shortness of breath. On EMS arrival patient was tripoding with accessory muscle use. 94% on room air and received 125mg  solumedrol, 2g Mag, 0.3 IM epi, 15mg  albuterol, 1 atrovent. 20 LAC BP 150/100, HR 104, RR 30 only speaking on word at a time, up to five at a time between breaths.   Allergies No Known Allergies  Level of Care/Admitting Diagnosis ED Disposition     ED Disposition  Admit   Condition  --   Comment  Hospital Area: [facility] [[id number]]  Level of Care: Progressive [102]  Admit to Progressive based on following criteria: MULTISYSTEM THREATS such as stable sepsis, metabolic/electrolyte imbalance with or without encephalopathy that is responding to early treatment.  May admit patient to [facility] or [facility] if equivalent level of care is available:: No  Covid Evaluation: Asymptomatic - no recent exposure (last 10 days) testing not required  Diagnosis: Acute exacerbation of chronic obstructive pulmonary disease (COPD) [facility]) [[id number]]  Admitting Physician: Angie Fava [[id number]]  Attending Physician: Angie Fava [[id number]]  Certification:: I certify this patient will need inpatient services for at least 2 midnights  Estimated Length of Stay: 2          B Medical/Surgery History Past Medical History:  Diagnosis Date   Anxiety    Bipolar 1 disorder (HCC)    COPD (chronic obstructive pulmonary disease) (HCC)    Depression    No pertinent past medical history    Schizophrenia  (HCC)    Substance abuse (HCC)    Past Surgical History:  Procedure Laterality Date   NO PAST SURGERIES       A IV Location/Drains/Wounds Patient Lines/Drains/Airways Status     Active Line/Drains/Airways     Name Placement date Placement time Site Days   Peripheral IV 06/18/22 20 G Left Antecubital 06/18/22  2309  Antecubital  1            Intake/Output Last 24 hours No intake or output data in the 24 hours ending 06/19/22 0106  Labs/Imaging Results for orders placed or performed during the hospital encounter of 06/18/22 (from the past 48 hour(s))  Basic metabolic panel     Status: Abnormal   Collection Time: 06/18/22 10:26 PM  Result Value Ref Range   Sodium 140 135 - 145 mmol/L   Potassium 5.2 (H) 3.5 - 5.1 mmol/L    Comment: HEMOLYSIS AT THIS LEVEL MAY AFFECT RESULT   Chloride 103 98 - 111 mmol/L   CO2 25 22 - 32 mmol/L   Glucose, Bld 124 (H) 70 - 99 mg/dL    Comment: Glucose reference range applies only to samples taken after fasting for at least 8 hours.   BUN 11 6 - 20 mg/dL   Creatinine, Ser 2.13 0.61 - 1.24 mg/dL   Calcium 9.0 8.9 - 08.6 mg/dL   GFR, Estimated >57 >84 mL/min    Comment: (NOTE) Calculated using the CKD-EPI Creatinine Equation (2021)    Anion gap 12 5 - 15    Comment: Performed at [facility]  [facility], [address], [place], [place] [address]  CBC with Differential     Status: Abnormal   Collection Time: 06/18/22 10:26 PM  Result Value Ref Range   WBC 7.3 4.0 - 10.5 K/uL   RBC 4.68 4.22 - 5.81 MIL/uL   Hemoglobin 14.1 13.0 - 17.0 g/dL   HCT 19.1 47.8 - 29.5 %   MCV 90.6 80.0 - 100.0 fL   MCH 30.1 26.0 - 34.0 pg   MCHC 33.3 30.0 - 36.0 g/dL   RDW 62.1 30.8 - 65.7 %   Platelets 313 150 - 400 K/uL   nRBC 0.0 0.0 - 0.2 %   Neutrophils Relative % 44 %   Neutro Abs 3.2 1.7 - 7.7 K/uL   Lymphocytes Relative 37 %   Lymphs Abs 2.7 0.7 - 4.0 K/uL   Monocytes Relative 7 %   Monocytes Absolute 0.5 0.1 - 1.0 K/uL   Eosinophils Relative 10 %    Eosinophils Absolute 0.8 (H) 0.0 - 0.5 K/uL   Basophils Relative 2 %   Basophils Absolute 0.1 0.0 - 0.1 K/uL   Immature Granulocytes 0 %   Abs Immature Granulocytes 0.02 0.00 - 0.07 K/uL    Comment: Performed at [facility], [address], [place], [place] [address]  Brain natriuretic peptide     Status: None   Collection Time: 06/18/22 10:26 PM  Result Value Ref Range   B Natriuretic Peptide 23.4 0.0 - 100.0 pg/mL    Comment: Performed at [facility], [address], [place], [place] [address]  I-Stat venous blood gas, [facility], [facility], [facility])     Status: Abnormal   Collection Time: 06/18/22 10:46 PM  Result Value Ref Range   pH, Ven 7.329 7.25 - 7.43   pCO2, Ven 57.9 44 - 60 mmHg   pO2, Ven 149 (H) 32 - 45 mmHg   Bicarbonate 30.5 (H) 20.0 - 28.0 mmol/L   TCO2 32 22 - 32 mmol/L   O2 Saturation 99 %   Acid-Base Excess 3.0 (H) 0.0 - 2.0 mmol/L   Sodium 140 135 - 145 mmol/L   Potassium 5.1 3.5 - 5.1 mmol/L   Calcium, Ion 1.10 (L) 1.15 - 1.40 mmol/L   HCT 44.0 39.0 - 52.0 %   Hemoglobin 15.0 13.0 - 17.0 g/dL   Sample type VENOUS   CBC with Differential/Platelet     Status: Abnormal   Collection Time: 06/19/22 12:17 AM  Result Value Ref Range   WBC 10.3 4.0 - 10.5 K/uL   RBC 4.52 4.22 - 5.81 MIL/uL   Hemoglobin 13.5 13.0 - 17.0 g/dL   HCT 41.3 24.4 - 01.0 %   MCV 91.6 80.0 - 100.0 fL   MCH 29.9 26.0 - 34.0 pg   MCHC 32.6 30.0 - 36.0 g/dL   RDW 27.2 53.6 - 64.4 %   Platelets 316 150 - 400 K/uL   nRBC 0.0 0.0 - 0.2 %   Neutrophils Relative % 92 %   Neutro Abs 9.4 (H) 1.7 - 7.7 K/uL   Lymphocytes Relative 5 %   Lymphs Abs 0.5 (L) 0.7 - 4.0 K/uL   Monocytes Relative 1 %   Monocytes Absolute 0.1 0.1 - 1.0 K/uL   Eosinophils Relative 1 %   Eosinophils Absolute 0.1 0.0 - 0.5 K/uL   Basophils Relative 1 %   Basophils Absolute 0.1 0.0 - 0.1 K/uL   Immature Granulocytes 0 %   Abs Immature Granulocytes 0.04 0.00 -  0.07 K/uL    Comment: Performed at [facility]  Lab, [address], [place], [place] [address]  I-Stat venous blood gas, ED     Status: Abnormal   Collection Time: 06/19/22 12:22 AM  Result Value Ref Range   pH, Ven 7.368 7.25 - 7.43   pCO2, Ven 41.6 (L) 44 - 60 mmHg   pO2, Ven 56 (H) 32 - 45 mmHg   Bicarbonate 24.0 20.0 - 28.0 mmol/L   TCO2 25 22 - 32 mmol/L   O2 Saturation 88 %   Acid-base deficit 1.0 0.0 - 2.0 mmol/L   Sodium 141 135 - 145 mmol/L   Potassium 3.5 3.5 - 5.1 mmol/L   Calcium, Ion 1.07 (L) 1.15 - 1.40 mmol/L   HCT 42.0 39.0 - 52.0 %   Hemoglobin 14.3 13.0 - 17.0 g/dL   Sample type VENOUS    DG Chest Port 1 View  Result Date: 06/18/2022 CLINICAL DATA:  Shortness of breath. EXAM: PORTABLE CHEST 1 VIEW COMPARISON:  Chest radiograph dated 05/28/2022. FINDINGS: No focal consolidation, pleural effusion, or pneumothorax. The cardiac silhouette is within normal limits. No acute osseous pathology. IMPRESSION: No active disease. Electronically Signed   By: Elgie Collard M.D.   On: 06/18/2022 23:10    Pending Labs Unresulted Labs (From admission, onward)     Start     Ordered   06/19/22 0500  Comprehensive metabolic panel  Tomorrow morning,   R        06/19/22 0001   06/19/22 0500  Magnesium  Tomorrow morning,   R        06/19/22 0001   06/19/22 0500  Phosphorus  Tomorrow morning,   R        06/19/22 0001   06/19/22 0500  Blood gas, venous  Tomorrow morning,   R        06/19/22 0004   06/19/22 0002  Magnesium  Add-on,   AD        06/19/22 0001            Vitals/Pain Today's Vitals   06/18/22 2228 06/18/22 2237 06/18/22 2300 06/18/22 2315  BP:   (!) 141/83 (!) 147/71  Pulse:   (!) 106 (!) 103  Resp:   19 19  SpO2:  100% 100% 100%  PainSc: 8        Isolation Precautions No active isolations  Medications Medications  acetaminophen (TYLENOL) tablet 650 mg (has no administration in time range)    Or  acetaminophen (TYLENOL) suppository 650 mg (has no administration in time range)  melatonin tablet 3 mg  (has no administration in time range)  ondansetron (ZOFRAN) injection 4 mg (has no administration in time range)  methylPREDNISolone sodium succinate (SOLU-MEDROL) 125 mg/2 mL injection 80 mg (has no administration in time range)  ipratropium-albuterol (DUONEB) 0.5-2.5 (3) MG/3ML nebulizer solution 3 mL (has no administration in time range)  azithromycin (ZITHROMAX) 500 mg in sodium chloride 0.9 % 250 mL IVPB (has no administration in time range)  albuterol (PROVENTIL) (2.5 MG/3ML) 0.083% nebulizer solution 2.5 mg (has no administration in time range)  albuterol (PROVENTIL) (2.5 MG/3ML) 0.083% nebulizer solution 15 mg (15 mg Nebulization Given 06/18/22 2232)  sodium chloride 0.9 % bolus 1,000 mL (1,000 mLs Intravenous New Bag/Given 06/18/22 2358)  azithromycin (ZITHROMAX) 500 mg in sodium chloride 0.9 % 250 mL IVPB (500 mg Intravenous New Bag/Given 06/18/22 2359)    Mobility walks     Focused Assessments  R Recommendations: See Admitting Provider Note  Report given to:   Additional Notes:

## 2022-06-19 NOTE — Plan of Care (Signed)
  Problem: Clinical Measurements: Goal: Respiratory complications will improve Outcome: Progressing   Problem: Activity: Goal: Risk for activity intolerance will decrease Outcome: Progressing   Problem: Safety: Goal: Ability to remain free from injury will improve Outcome: Progressing   

## 2022-06-19 NOTE — Progress Notes (Signed)
PROGRESS NOTE    Franklin [name]  QMV:[id number] DOB: [birth year] DOA: 06/18/2022 PCP: Patient, No Pcp Per  No chief complaint on file.   Brief Narrative:   Franklin [name] is Franklin [name] 52 y.o. male with medical history significant for COPD who is admitted to [facility] on 06/18/2022 with acute COPD exacerbation after presenting from home to [facility] ED complaining of shortness of breath.   He's been admitted for Franklin [name] COPD exacerbation  Assessment & Plan:   Principal Problem:   Acute exacerbation of chronic obstructive pulmonary disease (COPD) (HCC) Active Problems:   Tobacco abuse  COPD Exacerbation Currently on RA, but still wheezing Will continue steroids, scheduled and prn nebs, abx Needs prescription for controller med, sounds like he has advil   Alcohol Abuse  Cocaine abuse Sounds like he's at risk for withdrawal (he notes distant hx seizures) Drinks 12 pack [name] day  Scheduled librium, ciwa for ativan He's interested in rehab, will ask SW to discuss with him Does seem like he's withdrawing mildly   Tobacco Abuse Encourage cessation  Homelessness SW for resources    DVT prophylaxis: SCD Code Status: full Family Communication: none Disposition:   Status is: Inpatient Remains inpatient appropriate because: need for continued inpatient care   Consultants:  none  Procedures:  none  Antimicrobials:  Anti-infectives (From admission, onward)    Start     Dose/Rate Route Frequency Ordered Stop   06/19/22 1800  azithromycin (ZITHROMAX) 500 mg in sodium chloride 0.9 % 250 mL IVPB       Note to Pharmacy: (In setting of acute copd exac)   500 mg 250 mL/hr over 60 Minutes Intravenous Every 24 hours 06/19/22 0004     06/18/22 2345  azithromycin (ZITHROMAX) 500 mg in sodium chloride 0.9 % 250 mL IVPB        500 mg 250 mL/hr over 60 Minutes Intravenous  Once 06/18/22 2341 06/19/22 0107       Subjective: No complaints  Objective: Vitals:   06/19/22 0536 06/19/22 0721  06/19/22 0921 06/19/22 1106  BP: (!) 142/78 (!) 141/83  (!) 148/71  Pulse: 96 83  97  Resp:  18  18  Temp: 98 F (36.7 C) 97.8 F (36.6 C)  97.9 F (36.6 C)  TempSrc: Oral Oral  Oral  SpO2: 97% 97% 98% 97%  Weight:      Height:        Intake/Output Summary (Last 24 hours) at 06/19/2022 1303 Last data filed at 06/19/2022 1221 Gross per 24 hour  Intake 1845 ml  Output --  Net 1845 ml   Filed Weights   06/19/22 0233  Weight: 64.8 kg    Examination:  General exam: Appears calm and comfortable  Respiratory system: scattered wheezing on RA Cardiovascular system: RRR Gastrointestinal system: Abdomen is nondistended, soft and nontender. Central nervous system: Alert and oriented. No focal neurological deficits. Extremities: no LEE   Data Reviewed: I have personally reviewed following labs and imaging studies  CBC: Recent Labs  Lab 06/18/22 2226 06/18/22 2246 06/19/22 0017 06/19/22 0022  WBC 7.3  --  10.3  --   NEUTROABS 3.2  --  9.4*  --   HGB 14.1 15.0 13.5 14.3  HCT 42.4 44.0 41.4 42.0  MCV 90.6  --  91.6  --   PLT 313  --  316  --     Basic Metabolic Panel: Recent Labs  Lab 06/18/22 2226 06/18/22 2246 06/19/22 0017 06/19/22 0022  NA 140 140  141 141  K 5.2* 5.1 3.5 3.5  CL 103  --  103  --   CO2 25  --  22  --   GLUCOSE 124*  --  131*  --   BUN 11  --  9  --   CREATININE 1.00  --  0.92  --   CALCIUM 9.0  --  8.6*  --   MG  --   --  2.3  --   PHOS  --   --  3.0  --     GFR: Estimated Creatinine Clearance: 86.1 mL/min (by C-G formula based on SCr of 0.92 mg/dL).  Liver Function Tests: Recent Labs  Lab 06/19/22 0017  AST 25  ALT 22  ALKPHOS 63  BILITOT 0.7  PROT 6.6  ALBUMIN 3.7    CBG: No results for input(s): "GLUCAP" in the last 168 hours.   No results found for this or any previous visit (from the past 240 hour(s)).       Radiology Studies: DG Chest Port 1 View  Result Date: 06/18/2022 CLINICAL DATA:  Shortness of breath.  EXAM: PORTABLE CHEST 1 VIEW COMPARISON:  Chest radiograph dated 05/28/2022. FINDINGS: No focal consolidation, pleural effusion, or pneumothorax. The cardiac silhouette is within normal limits. No acute osseous pathology. IMPRESSION: No active disease. Electronically Signed   By: Elgie Collard M.D.   On: 06/18/2022 23:10        Scheduled Meds:  chlordiazePOXIDE  25 mg Oral QID   Followed by   [name] ON 06/20/2022] chlordiazePOXIDE  25 mg Oral TID   Followed by   [name] ON 06/21/2022] chlordiazePOXIDE  25 mg Oral BH-qamhs   Followed by   [name] ON 06/22/2022] chlordiazePOXIDE  25 mg Oral Daily   folic acid  1 mg Oral Daily   ipratropium-albuterol  3 mL Nebulization Q6H   methylPREDNISolone (SOLU-MEDROL) injection  80 mg Intravenous Q12H   multivitamin with minerals  1 tablet Oral Daily   multivitamin with minerals  1 tablet Oral Daily   thiamine  100 mg Oral Daily   Or   thiamine  100 mg Intravenous Daily   thiamine  100 mg Intramuscular Once   Continuous Infusions:  azithromycin       LOS: 1 day    Time spent: over 30 min    [name], MD [facility]   To contact the attending provider between 7A-7P or the covering provider during after hours 7P-7A, please log into the web site [email or web address] and access using universal [place] password for that web site. If you do not have the password, please call the hospital operator.  06/19/2022, 1:03 PM

## 2022-06-19 NOTE — Progress Notes (Signed)
Pt asking for Social worker and [facility] placement if possible.

## 2022-06-20 ENCOUNTER — Inpatient Hospital Stay (HOSPITAL_COMMUNITY): Payer: Medicaid Other

## 2022-06-20 DIAGNOSIS — R008 Other abnormalities of heart beat: Secondary | ICD-10-CM

## 2022-06-20 LAB — CBC
HCT: 41.4 % (ref 39.0–52.0)
Hemoglobin: 13.5 g/dL (ref 13.0–17.0)
MCH: 29.8 pg (ref 26.0–34.0)
MCHC: 32.6 g/dL (ref 30.0–36.0)
MCV: 91.4 fL (ref 80.0–100.0)
Platelets: 324 10*3/uL (ref 150–400)
RBC: 4.53 MIL/uL (ref 4.22–5.81)
RDW: 14.5 % (ref 11.5–15.5)
WBC: 12.8 10*3/uL — ABNORMAL HIGH (ref 4.0–10.5)
nRBC: 0 % (ref 0.0–0.2)

## 2022-06-20 LAB — BASIC METABOLIC PANEL
Anion gap: 8 (ref 5–15)
BUN: 13 mg/dL (ref 6–20)
CO2: 26 mmol/L (ref 22–32)
Calcium: 9.1 mg/dL (ref 8.9–10.3)
Chloride: 103 mmol/L (ref 98–111)
Creatinine, Ser: 0.93 mg/dL (ref 0.61–1.24)
GFR, Estimated: >60 mL/min (ref >60)
Glucose, Bld: 128 mg/dL — ABNORMAL HIGH (ref 70–99)
Potassium: 3.6 mmol/L (ref 3.5–5.1)
Sodium: 137 mmol/L (ref 135–145)

## 2022-06-20 LAB — ECHOCARDIOGRAM COMPLETE
Area-P 1/2: 5.02 cm2
Height: 70 in
S' Lateral: 3.05 cm
Weight: 2329.6 oz

## 2022-06-20 LAB — PHOSPHORUS: Phosphorus: 3.3 mg/dL (ref 2.5–4.6)

## 2022-06-20 LAB — MAGNESIUM: Magnesium: 2 mg/dL (ref 1.7–2.4)

## 2022-06-20 MED ORDER — MAGNESIUM OXIDE -MG SUPPLEMENT 400 (240 MG) MG PO TABS
200.0000 mg | ORAL_TABLET | Freq: Every day | ORAL | Status: DC
  Administered 2022-06-20 – 2022-06-21 (×2): 200 mg via ORAL
  Filled 2022-06-20 (×2): qty 1

## 2022-06-20 MED ORDER — POTASSIUM CHLORIDE CRYS ER 20 MEQ PO TBCR
60.0000 meq | EXTENDED_RELEASE_TABLET | Freq: Once | ORAL | Status: AC
  Administered 2022-06-20: 60 meq via ORAL
  Filled 2022-06-20: qty 3

## 2022-06-20 NOTE — Progress Notes (Signed)
PROGRESS NOTE    Franklin [name]  [id number]:[id number] DOB: Dec 16, 1970 DOA: 06/18/2022 PCP: Patient, No Pcp Per  No chief complaint on file.   Brief Narrative:   Franklin [name] is Franklin [name] 52 y.o. male with medical history significant for COPD who is admitted to [facility] on 06/18/2022 with acute COPD exacerbation after presenting from home to [facility] ED complaining of shortness of breath.   He's been admitted for Franklin [name] COPD exacerbation  Assessment & Plan:   Principal Problem:   Acute exacerbation of chronic obstructive pulmonary disease (COPD) (HCC) Active Problems:   Tobacco abuse  COPD Exacerbation Currently on RA, but still wheezing Will continue steroids, scheduled and prn nebs, abx Needs prescription for controller med, sounds like he has albuterol   Alcohol Abuse  Cocaine abuse Sounds like he's at risk for withdrawal (he notes distant hx seizures) Drinks 12 pack Franklin [name] day  Scheduled librium, ciwa for ativan He's interested in rehab, will ask SW to discuss with him Does seem like he's withdrawing mildly   NSVT  PAC's Follow echo Mag>2, k>4  Tobacco Abuse Encourage cessation  Homelessness SW for resources    DVT prophylaxis: SCD Code Status: full Family Communication: none Disposition:   Status is: Inpatient Remains inpatient appropriate because: need for continued inpatient care   Consultants:  none  Procedures:  none  Antimicrobials:  Anti-infectives (From admission, onward)    Start     Dose/Rate Route Frequency Ordered Stop   06/19/22 1800  azithromycin (ZITHROMAX) 500 mg in sodium chloride 0.9 % 250 mL IVPB       Note to Pharmacy: (In setting of acute copd exac)   500 mg 250 mL/hr over 60 Minutes Intravenous Every 24 hours 06/19/22 0004     06/18/22 2345  azithromycin (ZITHROMAX) 500 mg in sodium chloride 0.9 % 250 mL IVPB        500 mg 250 mL/hr over 60 Minutes Intravenous  Once 06/18/22 2341 06/19/22 0107       Subjective: No  complaints  Objective: Vitals:   06/19/22 0536 06/19/22 0721 06/19/22 0921 06/19/22 1106  BP: (!) 142/78 (!) 141/83  (!) 148/71  Pulse: 96 83  97  Resp:  18  18  Temp: 98 F (36.7 C) 97.8 F (36.6 C)  97.9 F (36.6 C)  TempSrc: Oral Oral  Oral  SpO2: 97% 97% 98% 97%  Weight:      Height:        Intake/Output Summary (Last 24 hours) at 06/19/2022 1303 Last data filed at 06/19/2022 1221 Gross per 24 hour  Intake 1845 ml  Output --  Net 1845 ml   Filed Weights   06/19/22 0233  Weight: 64.8 kg    Examination:  General: No acute distress. Cardiovascular: RRR Lungs: improved exam today, less wheezing Abdomen: Soft, nontender, nondistended  Neurological: Alert and oriented 3. Moves all extremities 4 with equal strength. Cranial nerves II through XII grossly intact. Extremities: No clubbing or cyanosis. No edema.   Data Reviewed: I have personally reviewed following labs and imaging studies  CBC: Recent Labs  Lab 06/18/22 2226 06/18/22 2246 06/19/22 0017 06/19/22 0022  WBC 7.3  --  10.3  --   NEUTROABS 3.2  --  9.4*  --   HGB 14.1 15.0 13.5 14.3  HCT 42.4 44.0 41.4 42.0  MCV 90.6  --  91.6  --   PLT 313  --  316  --     Basic Metabolic Panel: Recent Labs  Lab 06/18/22 2226 06/18/22 2246 06/19/22 0017 06/19/22 0022  NA 140 140 141 141  K 5.2* 5.1 3.5 3.5  CL 103  --  103  --   CO2 25  --  22  --   GLUCOSE 124*  --  131*  --   BUN 11  --  9  --   CREATININE 1.00  --  0.92  --   CALCIUM 9.0  --  8.6*  --   MG  --   --  2.3  --   PHOS  --   --  3.0  --     GFR: Estimated Creatinine Clearance: 86.1 mL/min (by C-G formula based on SCr of 0.92 mg/dL).  Liver Function Tests: Recent Labs  Lab 06/19/22 0017  AST 25  ALT 22  ALKPHOS 63  BILITOT 0.7  PROT 6.6  ALBUMIN 3.7    CBG: No results for input(s): "GLUCAP" in the last 168 hours.   No results found for this or any previous visit (from the past 240 hour(s)).       Radiology  Studies: DG Chest Port 1 View  Result Date: 06/18/2022 CLINICAL DATA:  Shortness of breath. EXAM: PORTABLE CHEST 1 VIEW COMPARISON:  Chest radiograph dated 05/28/2022. FINDINGS: No focal consolidation, pleural effusion, or pneumothorax. The cardiac silhouette is within normal limits. No acute osseous pathology. IMPRESSION: No active disease. Electronically Signed   By: Elgie Collard M.D.   On: 06/18/2022 23:10        Scheduled Meds:  chlordiazePOXIDE  25 mg Oral QID   Followed by   [name] ON 06/20/2022] chlordiazePOXIDE  25 mg Oral TID   Followed by   [name] ON 06/21/2022] chlordiazePOXIDE  25 mg Oral BH-qamhs   Followed by   [name] ON 06/22/2022] chlordiazePOXIDE  25 mg Oral Daily   folic acid  1 mg Oral Daily   ipratropium-albuterol  3 mL Nebulization Q6H   methylPREDNISolone (SOLU-MEDROL) injection  80 mg Intravenous Q12H   multivitamin with minerals  1 tablet Oral Daily   multivitamin with minerals  1 tablet Oral Daily   thiamine  100 mg Oral Daily   Or   thiamine  100 mg Intravenous Daily   thiamine  100 mg Intramuscular Once   Continuous Infusions:  azithromycin       LOS: 1 day    Time spent: over 30 min    [name], MD [facility]   To contact the attending provider between 7A-7P or the covering provider during after hours 7P-7A, please log into the web site [email or web address] and access using universal [place] password for that web site. If you do not have the password, please call the hospital operator.  06/19/2022, 1:03 PM

## 2022-06-21 ENCOUNTER — Other Ambulatory Visit (HOSPITAL_COMMUNITY): Payer: Self-pay

## 2022-06-21 LAB — PHOSPHORUS: Phosphorus: 3.1 mg/dL (ref 2.5–4.6)

## 2022-06-21 LAB — CBC WITH DIFFERENTIAL/PLATELET
Abs Immature Granulocytes: 0.06 10*3/uL (ref 0.00–0.07)
Basophils Absolute: 0 10*3/uL (ref 0.0–0.1)
Basophils Relative: 0 %
Eosinophils Absolute: 0 10*3/uL (ref 0.0–0.5)
Eosinophils Relative: 0 %
HCT: 42.4 % (ref 39.0–52.0)
Hemoglobin: 13.7 g/dL (ref 13.0–17.0)
Immature Granulocytes: 1 %
Lymphocytes Relative: 19 %
Lymphs Abs: 2.4 10*3/uL (ref 0.7–4.0)
MCH: 30 pg (ref 26.0–34.0)
MCHC: 32.3 g/dL (ref 30.0–36.0)
MCV: 92.8 fL (ref 80.0–100.0)
Monocytes Absolute: 0.8 10*3/uL (ref 0.1–1.0)
Monocytes Relative: 6 %
Neutro Abs: 9 10*3/uL — ABNORMAL HIGH (ref 1.7–7.7)
Neutrophils Relative %: 74 %
Platelets: 342 10*3/uL (ref 150–400)
RBC: 4.57 MIL/uL (ref 4.22–5.81)
RDW: 14.6 % (ref 11.5–15.5)
WBC: 12.3 10*3/uL — ABNORMAL HIGH (ref 4.0–10.5)
nRBC: 0 % (ref 0.0–0.2)

## 2022-06-21 LAB — COMPREHENSIVE METABOLIC PANEL
ALT: 23 U/L (ref 0–44)
AST: 23 U/L (ref 15–41)
Albumin: 3.1 g/dL — ABNORMAL LOW (ref 3.5–5.0)
Alkaline Phosphatase: 64 U/L (ref 38–126)
Anion gap: 15 (ref 5–15)
BUN: 19 mg/dL (ref 6–20)
CO2: 25 mmol/L (ref 22–32)
Calcium: 8.9 mg/dL (ref 8.9–10.3)
Chloride: 100 mmol/L (ref 98–111)
Creatinine, Ser: 1.09 mg/dL (ref 0.61–1.24)
GFR, Estimated: >60 mL/min (ref >60)
Glucose, Bld: 146 mg/dL — ABNORMAL HIGH (ref 70–99)
Potassium: 3.8 mmol/L (ref 3.5–5.1)
Sodium: 140 mmol/L (ref 135–145)
Total Bilirubin: 0.3 mg/dL (ref 0.3–1.2)
Total Protein: 5.8 g/dL — ABNORMAL LOW (ref 6.5–8.1)

## 2022-06-21 LAB — MAGNESIUM: Magnesium: 2 mg/dL (ref 1.7–2.4)

## 2022-06-21 MED ORDER — CHLORDIAZEPOXIDE HCL 25 MG PO CAPS
ORAL_CAPSULE | ORAL | 0 refills | Status: AC
  Filled 2022-06-21: qty 3, 2d supply, fill #0

## 2022-06-21 MED ORDER — PREDNISONE 20 MG PO TABS
40.0000 mg | ORAL_TABLET | Freq: Every day | ORAL | 0 refills | Status: AC
  Filled 2022-06-21 (×2): qty 4, 2d supply, fill #0

## 2022-06-21 MED ORDER — FLUTICASONE-SALMETEROL 250-50 MCG/ACT IN AEPB
1.0000 | INHALATION_SPRAY | Freq: Two times a day (BID) | RESPIRATORY_TRACT | 1 refills | Status: DC
  Filled 2022-06-21 (×2): qty 60, 30d supply, fill #0

## 2022-06-21 MED ORDER — ALBUTEROL SULFATE HFA 108 (90 BASE) MCG/ACT IN AERS
2.0000 | INHALATION_SPRAY | Freq: Four times a day (QID) | RESPIRATORY_TRACT | 2 refills | Status: DC | PRN
  Filled 2022-06-21: qty 6.7, 25d supply, fill #0
  Filled 2022-06-21: qty 8.5, 30d supply, fill #0

## 2022-06-21 MED ORDER — METOPROLOL TARTRATE 25 MG PO TABS
12.5000 mg | ORAL_TABLET | Freq: Two times a day (BID) | ORAL | 11 refills | Status: DC
  Filled 2022-06-21: qty 30, 30d supply, fill #0

## 2022-06-21 NOTE — TOC Transition Note (Signed)
Transition of Care [facility]) - CM/SW Discharge Note   Patient Details  Name: Franklin [name] MRN: [id number] Date of Birth: [birth year]  [facility] CM/SW Contact:  Leone Haven, RN Phone Number: 06/21/2022, 1:29 PM   Clinical Narrative:    For dc ,he has bus pass for transport. TOC to fill meds.   Final next level of care: Home/Self Care Barriers to Discharge: No Barriers Identified   Patient Goals and CMS Choice   Choice offered to / list presented to : NA  Discharge Placement                         Discharge Plan and Services Additional resources added to the After Visit Summary for   In-house Referral: NA Discharge Planning Services: CM Consult Post Acute Care Choice: NA            DME Agency: NA       HH Arranged: NA          Social Determinants of Health (SDOH) Interventions SDOH Screenings   Food Insecurity: Food Insecurity Present (05/28/2022)  Housing: Patient Declined (05/28/2022)  Transportation Needs: Unmet Transportation Needs (05/28/2022)  Utilities: Patient Declined (05/28/2022)  Tobacco Use: High Risk (06/19/2022)     Readmission Risk Interventions     No data to display

## 2022-06-21 NOTE — Progress Notes (Addendum)
Discharge instructions reviewed with pt. Pt in a hurry to go, pt states he has an appointment at 2:30pm.   Copy of instructions given to pt. Pt wanted to walk down, declined wheelchair, pt escorted to [facility] to pick up his scripts. Pt was given a bus pass by TOC NCM. Nurse walked to main entrance with pt. Pt d/c'd with his belongings.    Carman Essick,RN SWOT

## 2022-06-21 NOTE — Progress Notes (Signed)
CSW received a consult regarding resources for homelessness and substance use. CSW met with pt at bedside and discussed the resources listed in the packet given to pt. Pt understood and states that he has no further concerns but want to speak with his doctor. CSW made MD aware.

## 2022-06-21 NOTE — TOC Initial Note (Signed)
Transition of Care [facility]) - Initial/Assessment Note    Patient Details  Name: Franklin [name] MRN: [id number] Date of Birth: [birth year]  Transition of Care [facility]) CM/SW Contact:    Leone Haven, RN Phone Number: 06/21/2022, 1:26 PM  Clinical Narrative:                 Patient is homeless, he states he is going to live with his sister, Franklin [name] at [employer].  He states he needs a bus pass.  This NCM gave him a bus pass, he is set up with a [facility] , he does not have insurance.  He does not have any DME or HH services at this time.  Elease Hashimoto is his support sytem.  He gets medications from [facility]  He was just her in May and had meds filled per pharmacy for flunose, albuterol and prednisone, so for this admit he will need librium and metroprolol which is 15.00, NCM assisted patient with this from petty cash fund.   Expected Discharge Plan: Home/Self Care Barriers to Discharge: No Barriers Identified   Patient Goals and CMS Choice Patient states their goals for this hospitalization and ongoing recovery are:: go to sister's house, Franklin [name]   Choice offered to / list presented to : NA      Expected Discharge Plan and Services In-house Referral: NA Discharge Planning Services: CM Consult Post Acute Care Choice: NA Living arrangements for the past 2 months: Homeless Shelter Expected Discharge Date: 06/21/22                 DME Agency: NA       HH Arranged: NA          Prior Living Arrangements/Services Living arrangements for the past 2 months: Homeless Shelter Lives with:: Other (Comment) (homeless) Patient language and need for interpreter reviewed:: Yes Do you feel safe going back to the place where you live?: Yes        Care giver support system in place?: No (comment)   Criminal Activity/Legal Involvement Pertinent to Current Situation/Hospitalization: No - Comment as needed  Activities of Daily Living      Permission Sought/Granted                   Emotional Assessment Appearance:: Appears stated age Attitude/Demeanor/Rapport: Engaged Affect (typically observed): Appropriate Orientation: : Oriented to Self, Oriented to Place, Oriented to  Time, Oriented to Situation Alcohol / Substance Use: Illicit Drugs Psych Involvement: No (comment)  Admission diagnosis:  Acute exacerbation of chronic obstructive pulmonary disease (COPD) (HCC) [J44.1] COPD with acute exacerbation (HCC) [J44.1] Patient Active Problem List   Diagnosis Date Noted   Acute exacerbation of chronic obstructive pulmonary disease (COPD) (HCC) 06/18/2022   COPD with acute exacerbation (HCC) 05/28/2022   COPD exacerbation (HCC) 05/24/2022   Aspiration pneumonia (HCC) 05/24/2022   Alcohol abuse with alcohol-induced mood disorder (HCC) 02/02/2016   Generalized anxiety disorder    Substance abuse (HCC) 03/22/2015   Tobacco abuse 10/06/2014   Major depressive disorder, recurrent episode, severe, with psychotic behavior (HCC) 10/05/2014   Cocaine use disorder, severe, dependence (HCC) 10/02/2014   Major depressive disorder, recurrent, severe without psychotic features (HCC)    Alcohol use disorder, severe, dependence (HCC) 06/18/2014   Substance induced mood disorder (HCC) 03/14/2013   Bipolar I disorder, most recent episode depressed (HCC) 04/08/2012   Polysubstance abuse (HCC) 04/08/2012   PCP:  Patient, No Pcp Per Pharmacy:   [facility] [place]  [facility] [address] [place] [address] Phone: [phone] Fax: [phone]     Social Determinants of Health (SDOH) Social History: SDOH Screenings   Food Insecurity: Food Insecurity Present (05/28/2022)  Housing: Patient Declined (05/28/2022)  Transportation Needs: Unmet Transportation Needs (05/28/2022)  Utilities: Patient Declined (05/28/2022)  Tobacco Use: High Risk (06/19/2022)   SDOH Interventions:     Readmission Risk Interventions     No data to  display

## 2022-06-21 NOTE — Discharge Summary (Signed)
Physician Discharge Summary  Franklin [name] UJW:[id number] DOB: 03-04-1970 DOA: 06/18/2022  PCP: Patient, No Pcp Per  Admit date: 06/18/2022 Discharge date: 06/21/2022  Time spent: 40 minutes  Recommendations for Outpatient Follow-up:  Follow outpatient CBC/CMP  Encourage drug and alcohol cessation Consider pulm follow up outpatient  Follow abnormal echo outpatient - started on metop for NSVT, PACs   Discharge Diagnoses:  Principal Problem:   Acute exacerbation of chronic obstructive pulmonary disease (COPD) (HCC) Active Problems:   Tobacco abuse   Discharge Condition: stable  Diet recommendation: heart healthy  Filed Weights   06/19/22 0233 06/20/22 0447 06/21/22 0525  Weight: 64.8 kg 66 kg 66.4 kg    History of present illness:   Franklin [name] is Franklin [name] 52 y.o. male with medical history significant for COPD who is admitted to [facility] on 06/18/2022 with acute COPD exacerbation after presenting from home to [facility] ED complaining of shortness of breath.   He's been admitted for Franklin [name] COPD exacerbation   Improved with steroids, abx, nebs.    Stable for discharge, see below.  Hospital Course:  Assessment and Plan:  COPD Exacerbation Improved, discharge with steroids, albuterol, advair - follow outpatient    Alcohol Abuse  Cocaine abuse Sounds like he's at risk for withdrawal (he notes distant hx seizures) Drinks 12 pack Carlon Chaloux day  Scheduled librium, ciwa for ativan - d/c with short taper He's interested in rehab, will ask SW to discuss with him   NSVT  PAC's Follow echo with abnormal septal motion, EF 50-55% - follow outpatient (nothing acute to follow per cards) Mag>2, k>4 Will discharge on metop    Tobacco Abuse Encourage cessation   Homelessness SW for resources    Procedures:  Echo IMPRESSIONS     1. Abnormal septal motion. Left ventricular ejection fraction, by  estimation, is 50 to 55%. The left ventricle has low normal function. The  left ventricle  has no regional wall motion abnormalities. The left  ventricular internal cavity size was mildly  dilated. Left ventricular diastolic parameters were normal.   2. Right ventricular systolic function is normal. The right ventricular  size is normal.   3. The mitral valve is abnormal. Trivial mitral valve regurgitation. No  evidence of mitral stenosis.   4. The aortic valve was not well visualized. There is mild calcification  of the aortic valve. There is mild thickening of the aortic valve. Aortic  valve regurgitation is not visualized. Aortic valve sclerosis is present,  with no evidence of aortic valve   stenosis.   5. The inferior vena cava is normal in size with greater than 50%  respiratory variability, suggesting right atrial pressure of 3 mmHg.   Consultations: none  Discharge Exam: Vitals:   06/21/22 0729 06/21/22 0745  BP: 108/74   Pulse: (!) 105   Resp: 18   Temp: 97.6 F (36.4 C)   SpO2:  99%   No complaints Says he's going to his aunts house  General: No acute distress. Cardiovascular: Heart sounds show Franklin [name] regular rate, and rhythm. Tele with occasional sinus tachy with PAC's Lungs: Clear to auscultation bilaterally Abdomen: Soft, nontender, nondistended  Neurological: Alert and oriented 3. Moves all extremities 4 with equal strength. Cranial nerves II through XII grossly intact. Extremities: No clubbing or cyanosis. No edema.   Discharge Instructions   Discharge Instructions     Diet - low sodium heart healthy   Complete by: As directed    Discharge instructions   Complete by:  As directed    You were seen for Franklin [name] COPD exacerbation.  You've improved. We'll send you home with steroids.  I'll prescribe albuterol and advair for you to continue as needed outpatient.    It's important that you get continued care for your drug and alcohol use.    Your echo showed abnormal septal motion.  This can be followed with outpatient.  I'll start you on metoprolol for  your occasional fast heart rate and early beats.   Return for new, recurrent or worsening symptoms.  Please ask your PCP to request records from this hospitalization so they know what was done and what the next steps will be.   Increase activity slowly   Complete by: As directed       Allergies as of 06/21/2022   No Known Allergies      Medication List     TAKE these medications    albuterol 108 (90 Base) MCG/ACT inhaler Commonly known as: VENTOLIN HFA Inhale 2 puffs into the lungs every 6 (six) hours as needed for wheezing or shortness of breath.   chlordiazePOXIDE 25 MG capsule Commonly known as: LIBRIUM Take 1 capsule (25 mg total) by mouth 2 (two) times daily for 1 day, THEN 1 capsule (25 mg total) at bedtime for 1 day. Start taking on: June 21, 2022   fluticasone-salmeterol 250-50 MCG/ACT Aepb Commonly known as: Advair Diskus Inhale 1 puff into the lungs in the morning and at bedtime.   folic acid 1 MG tablet Commonly known as: FOLVITE Take 1 tablet (1 mg total) by mouth daily.   metoprolol tartrate 25 MG tablet Commonly known as: LOPRESSOR Take 0.5 tablets (12.5 mg total) by mouth 2 (two) times daily.   multivitamin with minerals Tabs tablet Take 1 tablet by mouth daily.   predniSONE 20 MG tablet Commonly known as: DELTASONE Take 2 tablets (40 mg total) by mouth daily with breakfast for 2 days. Start taking on: June 22, 2022   thiamine 100 MG tablet Commonly known as: Vitamin B-1 Take 1 tablet (100 mg total) by mouth daily.       No Known Allergies  Follow-up Information     [facility]. Go on 07/07/2022.   Why: @9 :30am Contact information: [address] [place] [facility] [id number] [phone]                 The results of significant diagnostics from this hospitalization (including imaging, microbiology, ancillary and laboratory) are listed below for reference.    Significant  Diagnostic Studies: ECHOCARDIOGRAM COMPLETE  Result Date: 06/20/2022    ECHOCARDIOGRAM REPORT   Patient Name:   Franklin [name] Date of Exam: 06/20/2022 Medical Rec #:  [id number]     Height:       70.0 in Accession #:    [id number]    Weight:       145.6 lb Date of Birth:  Sep 28, 1970    BSA:          1.824 m Patient Age:    51 years      BP:           132/80 mmHg Patient Gender: M             HR:           80 bpm. Exam Location:  Inpatient Procedure: 2D Echo, Cardiac Doppler and Color Doppler Indications:    Other abn of heart  History:  Patient has no prior history of Echocardiogram examinations.                 COPD.  Sonographer:    Melton Krebs RDCS, FE, PE Referring Phys: [phone] Olof Marcil CALDWELL POWELL JR IMPRESSIONS  1. Abnormal septal motion. Left ventricular ejection fraction, by estimation, is 50 to 55%. The left ventricle has low normal function. The left ventricle has no regional wall motion abnormalities. The left ventricular internal cavity size was mildly dilated. Left ventricular diastolic parameters were normal.  2. Right ventricular systolic function is normal. The right ventricular size is normal.  3. The mitral valve is abnormal. Trivial mitral valve regurgitation. No evidence of mitral stenosis.  4. The aortic valve was not well visualized. There is mild calcification of the aortic valve. There is mild thickening of the aortic valve. Aortic valve regurgitation is not visualized. Aortic valve sclerosis is present, with no evidence of aortic valve  stenosis.  5. The inferior vena cava is normal in size with greater than 50% respiratory variability, suggesting right atrial pressure of 3 mmHg. FINDINGS  Left Ventricle: Abnormal septal motion. Left ventricular ejection fraction, by estimation, is 50 to 55%. The left ventricle has low normal function. The left ventricle has no regional wall motion abnormalities. The left ventricular internal cavity size was mildly dilated. There is no left  ventricular hypertrophy. Left ventricular diastolic parameters were normal. Right Ventricle: The right ventricular size is normal. No increase in right ventricular wall thickness. Right ventricular systolic function is normal. Left Atrium: Left atrial size was normal in size. Right Atrium: Right atrial size was normal in size. Pericardium: There is no evidence of pericardial effusion. Mitral Valve: The mitral valve is abnormal. There is mild thickening of the mitral valve leaflet(s). Trivial mitral valve regurgitation. No evidence of mitral valve stenosis. Tricuspid Valve: The tricuspid valve is normal in structure. Tricuspid valve regurgitation is trivial. No evidence of tricuspid stenosis. Aortic Valve: The aortic valve was not well visualized. There is mild calcification of the aortic valve. There is mild thickening of the aortic valve. Aortic valve regurgitation is not visualized. Aortic valve sclerosis is present, with no evidence of aortic valve stenosis. Pulmonic Valve: The pulmonic valve was normal in structure. Pulmonic valve regurgitation is not visualized. No evidence of pulmonic stenosis. Aorta: The aortic root is normal in size and structure. Venous: The inferior vena cava is normal in size with greater than 50% respiratory variability, suggesting right atrial pressure of 3 mmHg. IAS/Shunts: No atrial level shunt detected by color flow Doppler.  LEFT VENTRICLE PLAX 2D LVIDd:         4.45 cm   Diastology LVIDs:         3.05 cm   LV e' medial:    9.17 cm/s LV PW:         0.95 cm   LV E/e' medial:  6.1 LV IVS:        0.75 cm   LV e' lateral:   13.40 cm/s LVOT diam:     2.40 cm   LV E/e' lateral: 4.2 LV SV:         86 LV SV Index:   47 LVOT Area:     4.52 cm  RIGHT VENTRICLE RV S prime:     12.90 cm/s TAPSE (M-mode): 1.5 cm LEFT ATRIUM             Index        RIGHT ATRIUM  Index LA diam:        2.80 cm 1.54 cm/m   RA Area:     10.40 cm LA Vol (A2C):   30.3 ml 16.61 ml/m  RA Volume:   20.40 ml   11.18 ml/m LA Vol (A4C):   35.6 ml 19.52 ml/m LA Biplane Vol: 34.7 ml 19.03 ml/m  AORTIC VALVE LVOT Vmax:   114.00 cm/s LVOT Vmean:  82.300 cm/s LVOT VTI:    0.190 m  AORTA Ao Root diam: 3.10 cm MITRAL VALVE               TRICUSPID VALVE MV Area (PHT): 5.02 cm    TR Peak grad:   9.2 mmHg MV Decel Time: 151 msec    TR Vmax:        152.00 cm/s MV E velocity: 56.30 cm/s MV Jaleeya Mcnelly velocity: 63.70 cm/s  SHUNTS MV E/Patric Buckhalter ratio:  0.88        Systemic VTI:  0.19 m                            Systemic Diam: 2.40 cm Charlton Haws MD Electronically signed by Charlton Haws MD Signature Date/Time: 06/20/2022/11:59:05 AM    Final    DG Chest Port 1 View  Result Date: 06/18/2022 CLINICAL DATA:  Shortness of breath. EXAM: PORTABLE CHEST 1 VIEW COMPARISON:  Chest radiograph dated 05/28/2022. FINDINGS: No focal consolidation, pleural effusion, or pneumothorax. The cardiac silhouette is within normal limits. No acute osseous pathology. IMPRESSION: No active disease. Electronically Signed   By: Elgie Collard M.D.   On: 06/18/2022 23:10   DG Chest Portable 1 View  Result Date: 05/28/2022 CLINICAL DATA:  Shortness of breath EXAM: PORTABLE CHEST 1 VIEW COMPARISON:  Radiograph 05/24/2022 FINDINGS: Hyperinflation and chronic bronchitic change. No focal consolidation, pleural effusion, or pneumothorax. No displaced rib fractures. Stable cardiomediastinal silhouette. IMPRESSION: No active disease.  Emphysema. Electronically Signed   By: Minerva Fester M.D.   On: 05/28/2022 00:40   DG Chest Portable 1 View  Result Date: 05/24/2022 CLINICAL DATA:  Shortness of breath EXAM: PORTABLE CHEST 1 VIEW COMPARISON:  Previous studies including the examination of 05/20/2022 FINDINGS: Cardiac size is within normal limits. There is increase in interstitial markings in the parahilar regions and lower lung fields. There is no focal consolidation. There is no pleural effusion or pneumothorax. IMPRESSION: Increased interstitial markings are seen in  parahilar regions and lower lung fields suggesting interstitial edema or interstitial pneumonia. Part of this finding may be due to difference in techniques. There is no focal pulmonary consolidation. There is no pleural effusion or pneumothorax. Electronically Signed   By: Ernie Avena M.D.   On: 05/24/2022 09:36    Microbiology: No results found for this or any previous visit (from the past 240 hour(s)).   Labs: Basic Metabolic Panel: Recent Labs  Lab 06/18/22 2226 06/18/22 2246 06/19/22 0017 06/19/22 0022 06/20/22 0106 06/21/22 0127  NA 140 140 141 141 137 140  K 5.2* 5.1 3.5 3.5 3.6 3.8  CL 103  --  103  --  103 100  CO2 25  --  22  --  26 25  GLUCOSE 124*  --  131*  --  128* 146*  BUN 11  --  9  --  13 19  CREATININE 1.00  --  0.92  --  0.93 1.09  CALCIUM 9.0  --  8.6*  --  9.1 8.9  MG  --   --  2.3  --  2.0 2.0  PHOS  --   --  3.0  --  3.3 3.1   Liver Function Tests: Recent Labs  Lab 06/19/22 0017 06/21/22 0127  AST 25 23  ALT 22 23  ALKPHOS 63 64  BILITOT 0.7 0.3  PROT 6.6 5.8*  ALBUMIN 3.7 3.1*   No results for input(s): "LIPASE", "AMYLASE" in the last 168 hours. No results for input(s): "AMMONIA" in the last 168 hours. CBC: Recent Labs  Lab 06/18/22 2226 06/18/22 2246 06/19/22 0017 06/19/22 0022 06/20/22 0106 06/21/22 0127  WBC 7.3  --  10.3  --  12.8* 12.3*  NEUTROABS 3.2  --  9.4*  --   --  9.0*  HGB 14.1 15.0 13.5 14.3 13.5 13.7  HCT 42.4 44.0 41.4 42.0 41.4 42.4  MCV 90.6  --  91.6  --  91.4 92.8  PLT 313  --  316  --  324 342   Cardiac Enzymes: No results for input(s): "CKTOTAL", "CKMB", "CKMBINDEX", "TROPONINI" in the last 168 hours. BNP: BNP (last 3 results) Recent Labs    05/24/22 0926 05/28/22 0017 06/18/22 2226  BNP 33.4 40.1 23.4    ProBNP (last 3 results) No results for input(s): "PROBNP" in the last 8760 hours.  CBG: No results for input(s): "GLUCAP" in the last 168 hours.     Signed:  [name] MD.   [facility] 06/21/2022, 12:33 PM

## 2022-06-30 ENCOUNTER — Encounter (HOSPITAL_COMMUNITY): Payer: Self-pay

## 2022-06-30 ENCOUNTER — Other Ambulatory Visit: Payer: Self-pay

## 2022-06-30 ENCOUNTER — Emergency Department (HOSPITAL_COMMUNITY)
Admission: EM | Admit: 2022-06-30 | Discharge: 2022-07-01 | Disposition: A | Discharge status: Home / Self Care | Payer: Medicaid Other | Attending: Emergency Medicine | Admitting: Emergency Medicine

## 2022-06-30 DIAGNOSIS — R600 Localized edema: Secondary | ICD-10-CM | POA: Diagnosis not present

## 2022-06-30 DIAGNOSIS — M7989 Other specified soft tissue disorders: Secondary | ICD-10-CM | POA: Diagnosis present

## 2022-06-30 DIAGNOSIS — R911 Solitary pulmonary nodule: Secondary | ICD-10-CM | POA: Diagnosis not present

## 2022-06-30 DIAGNOSIS — J449 Chronic obstructive pulmonary disease, unspecified: Secondary | ICD-10-CM | POA: Insufficient documentation

## 2022-06-30 LAB — CBC
HCT: 39.6 % (ref 39.0–52.0)
Hemoglobin: 12.7 g/dL — ABNORMAL LOW (ref 13.0–17.0)
MCH: 29.8 pg (ref 26.0–34.0)
MCHC: 32.1 g/dL (ref 30.0–36.0)
MCV: 93 fL (ref 80.0–100.0)
Platelets: 234 10*3/uL (ref 150–400)
RBC: 4.26 MIL/uL (ref 4.22–5.81)
RDW: 14.8 % (ref 11.5–15.5)
WBC: 8.9 10*3/uL (ref 4.0–10.5)
nRBC: 0 % (ref 0.0–0.2)

## 2022-06-30 LAB — BASIC METABOLIC PANEL
Anion gap: 6 (ref 5–15)
BUN: 28 mg/dL — ABNORMAL HIGH (ref 6–20)
CO2: 27 mmol/L (ref 22–32)
Calcium: 9 mg/dL (ref 8.9–10.3)
Chloride: 107 mmol/L (ref 98–111)
Creatinine, Ser: 1.18 mg/dL (ref 0.61–1.24)
GFR, Estimated: >60 mL/min (ref >60)
Glucose, Bld: 108 mg/dL — ABNORMAL HIGH (ref 70–99)
Potassium: 3.8 mmol/L (ref 3.5–5.1)
Sodium: 140 mmol/L (ref 135–145)

## 2022-06-30 NOTE — ED Triage Notes (Signed)
Left leg swelling from calf to foot for 2 days that has gotten worse. Significant swelling noted. Pt denies use of blood thinners. Denies hx of blood clots. Pt states he has decreased sensation in foot. Pedal pulses present in triage.

## 2022-06-30 NOTE — ED Provider Notes (Signed)
[place] EMERGENCY DEPARTMENT AT [facility] Provider Note   CSN: [id number] Arrival date & time: 06/30/22  1926     History {Add pertinent medical, surgical, social history, OB history to HPI:1} Chief Complaint  Patient presents with   Leg Swelling    Franklin [name] is a 52 y.o. male.  HPI   Patient has a history of depression anxiety bipolar disorder, substance abuse disorder, COPD, schizophrenia.  Patient presents to the ER with complaints of leg swelling.  Patient states he has had left leg swelling the last couple days.  He denies any trauma.  Also having tenderness in his left lower leg and calf.  He denies any history of blood clots.  He states he has having some episodes of chest pain.  Patient thought it was just related to his COPD.  Home Medications Prior to Admission medications   Medication Sig Start Date End Date Taking? Authorizing Provider  albuterol (VENTOLIN HFA) 108 (90 Base) MCG/ACT inhaler Inhale 2 puffs into the lungs every 6 (six) hours as needed for wheezing or shortness of breath. 06/21/22  Yes Zigmund Daniel., MD  fluticasone-salmeterol (ADVAIR DISKUS) 250-50 MCG/ACT AEPB Inhale 1 puff into the lungs in the morning and at bedtime. 06/21/22  Yes Zigmund Daniel., MD  metoprolol tartrate (LOPRESSOR) 25 MG tablet Take 0.5 tablets (12.5 mg total) by mouth 2 (two) times daily. 06/21/22 06/21/23 Yes Zigmund Daniel., MD  folic acid (FOLVITE) 1 MG tablet Take 1 tablet (1 mg total) by mouth daily. Patient not taking: Reported on 06/19/2022 06/01/22 08/30/22  Narda Bonds, MD  Multiple Vitamin (MULTIVITAMIN WITH MINERALS) TABS tablet Take 1 tablet by mouth daily. Patient not taking: Reported on 06/19/2022 06/01/22 08/30/22  Narda Bonds, MD  thiamine (VITAMIN B-1) 100 MG tablet Take 1 tablet (100 mg total) by mouth daily. Patient not taking: Reported on 06/19/2022 06/01/22 08/30/22  Narda Bonds, MD      Allergies    Patient has no known  allergies.    Review of Systems   Review of Systems  Physical Exam Updated Vital Signs BP (!) 150/98 (BP Location: Left Arm)   Pulse 75   Temp 98.3 F (36.8 C) (Oral)   Resp 18   Ht 1.778 m (5\' 10" )   Wt 68 kg   SpO2 97%   BMI 21.52 kg/m  Physical Exam Vitals and nursing note reviewed.  Constitutional:      General: He is not in acute distress.    Appearance: He is well-developed.  HENT:     Head: Normocephalic and atraumatic.     Right Ear: External ear normal.     Left Ear: External ear normal.  Eyes:     General: No scleral icterus.       Right eye: No discharge.        Left eye: No discharge.     Conjunctiva/sclera: Conjunctivae normal.  Neck:     Trachea: No tracheal deviation.  Cardiovascular:     Rate and Rhythm: Normal rate and regular rhythm.  Pulmonary:     Effort: Pulmonary effort is normal. No respiratory distress.     Breath sounds: Normal breath sounds. No stridor. No wheezing or rales.  Abdominal:     General: Bowel sounds are normal. There is no distension.     Palpations: Abdomen is soft.     Tenderness: There is no abdominal tenderness. There is no guarding or rebound.  Musculoskeletal:  General: Tenderness present. No deformity.     Cervical back: Neck supple.     Left lower leg: Edema present.     Comments: Edema and tenderness to the left lower extremity  Skin:    General: Skin is warm and dry.     Findings: No rash.  Neurological:     General: No focal deficit present.     Mental Status: He is alert.     Cranial Nerves: No cranial nerve deficit, dysarthria or facial asymmetry.     Sensory: No sensory deficit.     Motor: No abnormal muscle tone or seizure activity.     Coordination: Coordination normal.  Psychiatric:        Mood and Affect: Mood normal.     ED Results / Procedures / Treatments   Labs (all labs ordered are listed, but only abnormal results are displayed) Labs Reviewed  CBC  BASIC METABOLIC PANEL  I-STAT  CHEM 8, ED    EKG None  Radiology No results found.  Procedures Procedures  {Document cardiac monitor, telemetry assessment procedure when appropriate:1}  Medications Ordered in ED Medications - No data to display  ED Course/ Medical Decision Making/ A&P   {   Click here for ABCD2, HEART and other calculatorsREFRESH Note before signing :1}                          Medical Decision Making Parental diagnosis includes but not limited to peripheral edema, DVT, PE, CHF  Amount and/or Complexity of Data Reviewed Labs: ordered.   ***  {Document critical care time when appropriate:1} {Document review of labs and clinical decision tools ie heart score, Chads2Vasc2 etc:1}  {Document your independent review of radiology images, and any outside records:1} {Document your discussion with family members, caretakers, and with consultants:1} {Document social determinants of health affecting pt's care:1} {Document your decision making why or why not admission, treatments were needed:1} Final Clinical Impression(s) / ED Diagnoses Final diagnoses:  None    Rx / DC Orders ED Discharge Orders     None

## 2022-07-01 ENCOUNTER — Ambulatory Visit (HOSPITAL_COMMUNITY): Admission: RE | Admit: 2022-07-01 | Payer: Self-pay | Source: Ambulatory Visit

## 2022-07-01 ENCOUNTER — Emergency Department (HOSPITAL_BASED_OUTPATIENT_CLINIC_OR_DEPARTMENT_OTHER): Payer: Medicaid Other

## 2022-07-01 ENCOUNTER — Emergency Department (HOSPITAL_COMMUNITY): Payer: Medicaid Other

## 2022-07-01 ENCOUNTER — Emergency Department (HOSPITAL_COMMUNITY)
Admission: EM | Admit: 2022-07-01 | Discharge: 2022-07-01 | Disposition: A | Discharge status: Home / Self Care | Payer: Medicaid Other | Attending: Emergency Medicine | Admitting: Emergency Medicine

## 2022-07-01 ENCOUNTER — Other Ambulatory Visit: Payer: Self-pay

## 2022-07-01 ENCOUNTER — Encounter (HOSPITAL_COMMUNITY): Payer: Self-pay

## 2022-07-01 DIAGNOSIS — J449 Chronic obstructive pulmonary disease, unspecified: Secondary | ICD-10-CM | POA: Diagnosis not present

## 2022-07-01 DIAGNOSIS — Z7951 Long term (current) use of inhaled steroids: Secondary | ICD-10-CM | POA: Diagnosis not present

## 2022-07-01 DIAGNOSIS — M7989 Other specified soft tissue disorders: Secondary | ICD-10-CM

## 2022-07-01 DIAGNOSIS — R600 Localized edema: Secondary | ICD-10-CM | POA: Diagnosis not present

## 2022-07-01 LAB — TROPONIN I (HIGH SENSITIVITY): Troponin I (High Sensitivity): 4 ng/L (ref <18)

## 2022-07-01 LAB — I-STAT CHEM 8, ED
BUN: 28 mg/dL — ABNORMAL HIGH (ref 6–20)
Calcium, Ion: 1.24 mmol/L (ref 1.15–1.40)
Chloride: 105 mmol/L (ref 98–111)
Creatinine, Ser: 1.1 mg/dL (ref 0.61–1.24)
Glucose, Bld: 147 mg/dL — ABNORMAL HIGH (ref 70–99)
HCT: 37 % — ABNORMAL LOW (ref 39.0–52.0)
Hemoglobin: 12.6 g/dL — ABNORMAL LOW (ref 13.0–17.0)
Potassium: 3.7 mmol/L (ref 3.5–5.1)
Sodium: 140 mmol/L (ref 135–145)
TCO2: 28 mmol/L (ref 22–32)

## 2022-07-01 LAB — BRAIN NATRIURETIC PEPTIDE: B Natriuretic Peptide: 47.7 pg/mL (ref 0.0–100.0)

## 2022-07-01 MED ORDER — ENOXAPARIN SODIUM 80 MG/0.8ML IJ SOSY
1.0000 mg/kg | PREFILLED_SYRINGE | Freq: Once | INTRAMUSCULAR | Status: AC
  Administered 2022-07-01: 67.5 mg via SUBCUTANEOUS
  Filled 2022-07-01: qty 0.68

## 2022-07-01 MED ORDER — IOHEXOL 350 MG/ML SOLN
80.0000 mL | Freq: Once | INTRAVENOUS | Status: AC | PRN
  Administered 2022-07-01: 80 mL via INTRAVENOUS

## 2022-07-01 MED ORDER — SODIUM CHLORIDE (PF) 0.9 % IJ SOLN
INTRAMUSCULAR | Status: AC
  Filled 2022-07-01: qty 50

## 2022-07-01 NOTE — ED Provider Notes (Signed)
Patient signed out pending CT angio to rule out PE.  CT does not show any evidence of PE.  There is a pulmonary nodule.  Patient does endorse smoking.  Will likely need follow-up for stability.  I discussed this with the patient and he states understanding.  He was given a dose of 1 mg/kg of Lovenox and will return later today for DVT imaging.  Physical Exam  BP 139/80   Pulse 64   Temp 97.8 F (36.6 C) (Oral)   Resp 19   Ht 1.778 m (5\' 10" )   Wt 68 kg   SpO2 98%   BMI 21.52 kg/m   Physical Exam Awake, alert, no acute distress Procedures  Procedures  ED Course / MDM   Clinical Course as of 07/01/22 0224  Thu Jul 01, 2022  0022 Metabolic panel normal.  CBC normal [JK]    Clinical Course User Index [JK] Linwood Dibbles, MD   Medical Decision Making Amount and/or Complexity of Data Reviewed Labs: ordered. Radiology: ordered.  Risk Prescription drug management.   Problem List Items Addressed This Visit   None Visit Diagnoses     Leg swelling    -  Primary   Pulmonary nodule                 Wilkie Aye Mayer Masker, MD 07/01/22 [id number]

## 2022-07-01 NOTE — Progress Notes (Signed)
LLE venous duplex has been completed.  Preliminary results given to [facility], PA-C.   Results can be found under chart review under CV PROC. 07/01/2022 3:25 PM Uel Davidow RVT, RDMS

## 2022-07-01 NOTE — Discharge Instructions (Addendum)
Your ultrasound does not show any blood clots.  We are placing a compression stocking on your leg.  Elevate your leg at night.  Follow-up with a primary care doctor.  I have given you the information for an office that does not require insurance.  Please call to make an appoint with them

## 2022-07-01 NOTE — Discharge Instructions (Addendum)
You were seen today for concerns for lower extremity swelling and chest discomfort.  Your workup today is reassuring.  You were given a dose of Lovenox.  You need to return later today for ultrasound imaging to rule out blood clot in your leg.  You were noted to have a pulmonary nodule on your CT scan.  Given that you are a smoker, you should have repeat CT imaging to ensure stability.

## 2022-07-01 NOTE — ED Triage Notes (Signed)
Left leg swelling from calf to foot for 2 days per pt.  Significant swelling noted.  Denies hx of blood clots. States was seen lastnight, MD wanted him to go to [facility] but he refused'. Denies SOB, CP,dizziness

## 2022-07-01 NOTE — ED Provider Notes (Signed)
[place] [facility] AT [facility] Provider Note   CSN: [id number] Arrival date & time: 07/01/22  1210     History  Chief Complaint  Patient presents with   Leg Pain    Franklin [name] is a 52 y.o. male.   Leg Pain  Patient is a 52 year old male with past medical history significant for bipolar, substance abuse, COPD, schizophrenia  He presents emergency room today with complaints of left leg swelling he states that has been present for 3 to 4 days.  He was seen in emergency room yesterday and had extensive workup however ultrasound was not available and he returned to the emergency room today for an ultrasound to rule out a DVT.  He denies any trauma to his leg.  He states that he is not having worse pain with his leg elevated versus dependent.  He has had some episodes of chest pain for which she was worked up yesterday however denies any more episodes of chest pain today.      Home Medications Prior to Admission medications   Medication Sig Start Date End Date Taking? Authorizing Provider  albuterol (VENTOLIN HFA) 108 (90 Base) MCG/ACT inhaler Inhale 2 puffs into the lungs every 6 (six) hours as needed for wheezing or shortness of breath. 06/21/22   Zigmund Daniel., MD  fluticasone-salmeterol (ADVAIR DISKUS) 250-50 MCG/ACT AEPB Inhale 1 puff into the lungs in the morning and at bedtime. 06/21/22   Zigmund Daniel., MD  folic acid (FOLVITE) 1 MG tablet Take 1 tablet (1 mg total) by mouth daily. Patient not taking: Reported on 06/19/2022 06/01/22 08/30/22  Narda Bonds, MD  metoprolol tartrate (LOPRESSOR) 25 MG tablet Take 0.5 tablets (12.5 mg total) by mouth 2 (two) times daily. 06/21/22 06/21/23  Zigmund Daniel., MD  Multiple Vitamin (MULTIVITAMIN WITH MINERALS) TABS tablet Take 1 tablet by mouth daily. Patient not taking: Reported on 06/19/2022 06/01/22 08/30/22  Narda Bonds, MD  thiamine (VITAMIN B-1) 100 MG tablet Take 1 tablet (100 mg  total) by mouth daily. Patient not taking: Reported on 06/19/2022 06/01/22 08/30/22  Narda Bonds, MD      Allergies    Patient has no allergy information on record.    Review of Systems   Review of Systems  Physical Exam Updated Vital Signs BP (!) 145/92 (BP Location: Left Arm)   Pulse (!) 55   Temp 97.9 F (36.6 C) (Oral)   Resp 18   Ht 5\' 10"  (1.778 m)   Wt 68 kg   SpO2 100%   BMI 21.52 kg/m  Physical Exam Vitals and nursing note reviewed.  Constitutional:      General: He is not in acute distress. HENT:     Head: Normocephalic and atraumatic.     Nose: Nose normal.  Eyes:     General: No scleral icterus. Cardiovascular:     Rate and Rhythm: Normal rate and regular rhythm.     Pulses: Normal pulses.     Heart sounds: Normal heart sounds.  Pulmonary:     Effort: Pulmonary effort is normal. No respiratory distress.     Breath sounds: No wheezing.  Abdominal:     Palpations: Abdomen is soft.     Tenderness: There is no abdominal tenderness.  Musculoskeletal:     Cervical back: Normal range of motion.     Right lower leg: No edema.     Left lower leg: Edema present.  Comments: Left lower extremity is swollen compared to right.  DP PT pulses 3+ and symmetric, sensation intact in lower extremities walking WO difficulty  Skin:    General: Skin is warm and dry.     Capillary Refill: Capillary refill takes less than 2 seconds.  Neurological:     Mental Status: He is alert. Mental status is at baseline.  Psychiatric:        Mood and Affect: Mood normal.        Behavior: Behavior normal.     ED Results / Procedures / Treatments   Labs (all labs ordered are listed, but only abnormal results are displayed) Labs Reviewed - No data to display  EKG None  Radiology VAS [place] LOWER EXTREMITY VENOUS (DVT) (7a-7p)  Result Date: 07/01/2022  Lower Venous DVT Study Patient Name:  EWARD RUTIGLIANO  Date of Exam:   07/01/2022 Medical Rec #: [id number]      Accession #:     [id number] Date of Birth: [birth year]     Patient Gender: M Patient Age:   26 years Exam Location:  [facility] Procedure:      VAS [place] LOWER EXTREMITY VENOUS (DVT) Referring Phys: Stevphen Meuse Viki Carrera --------------------------------------------------------------------------------  Indications: Swelling.  Comparison Study: No previous exams Performing Technologist: Jody Hill RVT, RDMS  Examination Guidelines: A complete evaluation includes B-mode imaging, spectral Doppler, color Doppler, and power Doppler as needed of all accessible portions of each vessel. Bilateral testing is considered an integral part of a complete examination. Limited examinations for reoccurring indications may be performed as noted. The reflux portion of the exam is performed with the patient in reverse Trendelenburg.  +-----+---------------+---------+-----------+----------+--------------+ RIGHTCompressibilityPhasicitySpontaneityPropertiesThrombus Aging +-----+---------------+---------+-----------+----------+--------------+ CFV  Full           Yes      Yes                                 +-----+---------------+---------+-----------+----------+--------------+   +---------+---------------+---------+-----------+----------+--------------+ LEFT     CompressibilityPhasicitySpontaneityPropertiesThrombus Aging +---------+---------------+---------+-----------+----------+--------------+ CFV      Full           Yes      Yes                                 +---------+---------------+---------+-----------+----------+--------------+ SFJ      Full                                                        +---------+---------------+---------+-----------+----------+--------------+ FV Prox  Full           Yes      Yes                                 +---------+---------------+---------+-----------+----------+--------------+ FV Mid   Full           Yes      Yes                                  +---------+---------------+---------+-----------+----------+--------------+ FV DistalFull           Yes  Yes                                 +---------+---------------+---------+-----------+----------+--------------+ PFV      Full                                                        +---------+---------------+---------+-----------+----------+--------------+ POP      Full           Yes      Yes                                 +---------+---------------+---------+-----------+----------+--------------+ PTV      Full                                                        +---------+---------------+---------+-----------+----------+--------------+ PERO     Full                                                        +---------+---------------+---------+-----------+----------+--------------+     Summary: RIGHT: - No evidence of common femoral vein obstruction.  LEFT: - There is no evidence of deep vein thrombosis in the lower extremity.  - No cystic structure found in the popliteal fossa. Small amount of edema seen in area of distal calf and ankle.  *See table(s) above for measurements and observations.    Preliminary    CT Angio Chest PE W and/or Wo Contrast  Result Date: 07/01/2022 CLINICAL DATA:  Pulmonary embolism (PE) suspected, high prob EXAM: CT ANGIOGRAPHY CHEST WITH CONTRAST TECHNIQUE: Multidetector CT imaging of the chest was performed using the standard protocol during bolus administration of intravenous contrast. Multiplanar CT image reconstructions and MIPs were obtained to evaluate the vascular anatomy. RADIATION DOSE REDUCTION: This exam was performed according to the departmental dose-optimization program which includes automated exposure control, adjustment of the mA and/or kV according to patient size and/or use of iterative reconstruction technique. CONTRAST:  80mL OMNIPAQUE IOHEXOL 350 MG/ML SOLN COMPARISON:  Chest x-ray 06/18/2022 FINDINGS: Cardiovascular:  Satisfactory opacification of the pulmonary arteries to the segmental level. No evidence of pulmonary embolism. Normal heart size. No significant pericardial effusion. The thoracic aorta is normal in caliber. No atherosclerotic plaque of the thoracic aorta. No coronary artery calcifications. Mediastinum/Nodes: No enlarged mediastinal, hilar, or axillary lymph nodes. Thyroid gland, trachea, and esophagus demonstrate no significant findings. Lungs/Pleura: No focal consolidation. 4 mm subpleural right lower lobe pulmonary nodule (13:89). No pulmonary mass. No pleural effusion. No pneumothorax. Upper Abdomen: No acute abnormality. Musculoskeletal: No chest wall abnormality. No suspicious lytic or blastic osseous lesions. No acute displaced fracture. Review of the MIP images confirms the above findings. IMPRESSION: 1. No pulmonary embolus. 2. A 4 mm subpleural right lower lobe pulmonary nodule. No follow-up needed if patient is low-risk.This recommendation follows the consensus statement: Guidelines for Management of Incidental Pulmonary Nodules Detected on CT  Images: From the Fleischner Society 2017; Radiology 2017; [phone]. Electronically Signed   By: Tish Frederickson M.D.   On: 07/01/2022 01:43    Procedures Procedures    Medications Ordered in ED Medications - No data to display  ED Course/ Medical Decision Making/ A&P                             Medical Decision Making  Patient is a 52 year old male with past medical history significant for bipolar, substance abuse, COPD, schizophrenia  He presents emergency room today with complaints of left leg swelling he states that has been present for 3 to 4 days.  He was seen in emergency room yesterday and had extensive workup however ultrasound was not available and he returned to the emergency room today for an ultrasound to rule out a DVT.  He denies any trauma to his leg.  He states that he is not having worse pain with his leg elevated versus  dependent.  He has had some episodes of chest pain for which she was worked up yesterday however denies any more episodes of chest pain today.  Vital signs normal, well-appearing on exam distally neurovascular intact  DVT study negative.  Will place patient on TED hose this was ordered here.  Recommend elevation and follow-up with primary care.  He does not currently have a primary care provider I will provide him with the information for the [place] [facility] which does not require insurance.  Return precautions emergency room were provided to patient.  He is agreeable to plan   Final Clinical Impression(s) / ED Diagnoses Final diagnoses:  Leg swelling    Rx / DC Orders ED Discharge Orders     None         Gailen Shelter, [place] 07/01/22 1541    Glyn Ade, MD 07/01/22 1610

## 2022-07-06 ENCOUNTER — Telehealth (INDEPENDENT_AMBULATORY_CARE_PROVIDER_SITE_OTHER): Payer: Self-pay | Admitting: Primary Care

## 2022-07-06 NOTE — Telephone Encounter (Signed)
Pt had a number that was unavailable

## 2022-07-07 ENCOUNTER — Inpatient Hospital Stay (INDEPENDENT_AMBULATORY_CARE_PROVIDER_SITE_OTHER): Payer: Self-pay | Admitting: Primary Care

## 2022-07-12 ENCOUNTER — Ambulatory Visit: Payer: Medicaid Other | Admitting: Physician Assistant

## 2022-07-12 ENCOUNTER — Encounter: Payer: Self-pay | Admitting: Physician Assistant

## 2022-07-12 VITALS — BP 120/79 | HR 56 | Ht 70.0 in | Wt 157.0 lb

## 2022-07-12 DIAGNOSIS — R911 Solitary pulmonary nodule: Secondary | ICD-10-CM

## 2022-07-12 DIAGNOSIS — R7303 Prediabetes: Secondary | ICD-10-CM

## 2022-07-12 DIAGNOSIS — F1420 Cocaine dependence, uncomplicated: Secondary | ICD-10-CM

## 2022-07-12 DIAGNOSIS — F3130 Bipolar disorder, current episode depressed, mild or moderate severity, unspecified: Secondary | ICD-10-CM | POA: Diagnosis not present

## 2022-07-12 DIAGNOSIS — Z13220 Encounter for screening for lipoid disorders: Secondary | ICD-10-CM

## 2022-07-12 DIAGNOSIS — Z720 Tobacco use: Secondary | ICD-10-CM

## 2022-07-12 DIAGNOSIS — R7309 Other abnormal glucose: Secondary | ICD-10-CM

## 2022-07-12 DIAGNOSIS — F17210 Nicotine dependence, cigarettes, uncomplicated: Secondary | ICD-10-CM | POA: Diagnosis not present

## 2022-07-12 DIAGNOSIS — F1020 Alcohol dependence, uncomplicated: Secondary | ICD-10-CM

## 2022-07-12 DIAGNOSIS — F411 Generalized anxiety disorder: Secondary | ICD-10-CM

## 2022-07-12 DIAGNOSIS — J449 Chronic obstructive pulmonary disease, unspecified: Secondary | ICD-10-CM

## 2022-07-12 DIAGNOSIS — I493 Ventricular premature depolarization: Secondary | ICD-10-CM

## 2022-07-12 DIAGNOSIS — Z125 Encounter for screening for malignant neoplasm of prostate: Secondary | ICD-10-CM

## 2022-07-12 DIAGNOSIS — R931 Abnormal findings on diagnostic imaging of heart and coronary circulation: Secondary | ICD-10-CM

## 2022-07-12 LAB — POCT GLYCOSYLATED HEMOGLOBIN (HGB A1C): Hemoglobin A1C: 5.9 % — AB (ref 4.0–5.6)

## 2022-07-12 MED ORDER — ALBUTEROL SULFATE HFA 108 (90 BASE) MCG/ACT IN AERS
2.0000 | INHALATION_SPRAY | Freq: Four times a day (QID) | RESPIRATORY_TRACT | 2 refills | Status: DC | PRN
Start: 2022-07-12 — End: 2022-11-01

## 2022-07-12 MED ORDER — FLUTICASONE-SALMETEROL 250-50 MCG/ACT IN AEPB
1.0000 | INHALATION_SPRAY | Freq: Two times a day (BID) | RESPIRATORY_TRACT | 1 refills | Status: DC
Start: 2022-07-12 — End: 2022-11-01

## 2022-07-12 MED ORDER — METOPROLOL TARTRATE 25 MG PO TABS
12.5000 mg | ORAL_TABLET | Freq: Two times a day (BID) | ORAL | 1 refills | Status: DC
Start: 2022-07-12 — End: 2022-10-24

## 2022-07-12 NOTE — Progress Notes (Unsigned)
New Patient Office Visit  Subjective    Patient ID: Franklin [name], male    DOB: 05/07/1970  Age: 52 y.o. MRN: [id number]  CC:  Chief Complaint  Patient presents with   Palpitations    Patient was scheduled with his np heart doctor. But he missed his appointment.    Shortness of Breath    HPI Franklin [name] states that he is currently being treated for substance abuse at [facility] residential treatment center.  States that he arrived on July 3.  States that he does plan on going to an [place] [facility] in either [place] or [place] after he completes his program.  States that he has been off of his medications due to being homeless for the previous 2 months and [place] his medications [place] bus station.  States that he was seen in the emergency department on July 01, 2022.  Note from that visit :  Patient is a 52 year old male with past medical history significant for bipolar, substance abuse, COPD, schizophrenia   He presents emergency room today with complaints of left leg swelling he states that has been present for 3 to 4 days.  He was seen in emergency room yesterday and had extensive workup however ultrasound was not available and he returned to the emergency room today for an ultrasound to rule out a DVT.  He denies any trauma to his leg.  He states that he is not having worse pain with his leg elevated versus dependent.  He has had some episodes of chest pain for which she was worked up yesterday however denies any more episodes of chest pain today.  Medical Decision Making   Patient is a 52 year old male with past medical history significant for bipolar, substance abuse, COPD, schizophrenia   He presents emergency room today with complaints of left leg swelling he states that has been present for 3 to 4 days.  He was seen in emergency room yesterday and had extensive workup however ultrasound was not available and he returned to the emergency room today for an ultrasound to rule out  a DVT.  He denies any trauma to his leg.  He states that he is not having worse pain with his leg elevated versus dependent.  He has had some episodes of chest pain for which she was worked up yesterday however denies any more episodes of chest pain today.   Vital signs normal, well-appearing on exam distally neurovascular intact   DVT study negative.  Will place patient on TED hose this was ordered here.  Recommend elevation and follow-up with primary care.  He does not currently have a primary care provider I will provide him with the information for the [place] wellness clinic which does not require insurance.  Return precautions emergency room were provided to patient.  He is agreeable to plan  He was also hospitalized from June 14 through June 21, 2022.  Hospital course:  Hospital Course:  Assessment and Plan:   COPD Exacerbation Improved, discharge with steroids, albuterol, advair - follow outpatient    Alcohol Abuse  Cocaine abuse Sounds like he's at risk for withdrawal (he notes distant hx seizures) Drinks 12 pack a day  Scheduled librium, ciwa for ativan - d/c with short taper He's interested in rehab, will ask SW to discuss with him   NSVT  PAC's Follow echo with abnormal septal motion, EF 50-55% - follow outpatient (nothing acute to follow per cards) Mag>2, k>4 Will discharge on metop  Tobacco Abuse Encourage cessation   Homelessness SW for resources       Procedures:  Echo IMPRESSIONS     1. Abnormal septal motion. Left ventricular ejection fraction, by  estimation, is 50 to 55%. The left ventricle has low normal function. The  left ventricle has no regional wall motion abnormalities. The left  ventricular internal cavity size was mildly  dilated. Left ventricular diastolic parameters were normal.   2. Right ventricular systolic function is normal. The right ventricular  size is normal.   3. The mitral valve is abnormal. Trivial mitral valve  regurgitation. No  evidence of mitral stenosis.   4. The aortic valve was not well visualized. There is mild calcification  of the aortic valve. There is mild thickening of the aortic valve. Aortic  valve regurgitation is not visualized. Aortic valve sclerosis is present,  with no evidence of aortic valve   stenosis.   5. The inferior vena cava is normal in size with greater than 50%  respiratory variability, suggesting right atrial pressure of 3 mmHg.     Also endorses abnormal imaging of lung completed July 01, 2022. 30 pack year smoker  IMPRESSION: 1. No pulmonary embolus. 2. A 4 mm subpleural right lower lobe pulmonary nodule. No follow-up needed if patient is low-risk.This recommendation follows the consensus statement: Guidelines for Management of Incidental Pulmonary Nodules Detected on CT Images: From the Fleischner Society 2017; Radiology 2017; 284:228-243.  States today that he has been having shortness of breath on a daily basis.  States that he was unable to attend his cardiology appointment.  Outpatient Encounter Medications as of 07/12/2022  Medication Sig   albuterol (VENTOLIN HFA) 108 (90 Base) MCG/ACT inhaler Inhale 2 puffs into the lungs every 6 (six) hours as needed for wheezing or shortness of breath.   fluticasone-salmeterol (ADVAIR DISKUS) 250-50 MCG/ACT AEPB Inhale 1 puff into the lungs in the morning and at bedtime.   folic acid (FOLVITE) 1 MG tablet Take 1 tablet (1 mg total) by mouth daily. (Patient not taking: Reported on 06/19/2022)   metoprolol tartrate (LOPRESSOR) 25 MG tablet Take 0.5 tablets (12.5 mg total) by mouth 2 (two) times daily.   Multiple Vitamin (MULTIVITAMIN WITH MINERALS) TABS tablet Take 1 tablet by mouth daily. (Patient not taking: Reported on 06/19/2022)   thiamine (VITAMIN B-1) 100 MG tablet Take 1 tablet (100 mg total) by mouth daily. (Patient not taking: Reported on 06/19/2022)   [DISCONTINUED] albuterol (VENTOLIN HFA) 108 (90 Base) MCG/ACT  inhaler Inhale 2 puffs into the lungs every 6 (six) hours as needed for wheezing or shortness of breath.   [DISCONTINUED] fluticasone-salmeterol (ADVAIR DISKUS) 250-50 MCG/ACT AEPB Inhale 1 puff into the lungs in the morning and at bedtime.   [DISCONTINUED] metoprolol tartrate (LOPRESSOR) 25 MG tablet Take 0.5 tablets (12.5 mg total) by mouth 2 (two) times daily.   No facility-administered encounter medications on file as of 07/12/2022.    Past Medical History:  Diagnosis Date   Anxiety    Bipolar 1 disorder (HCC)    COPD (chronic obstructive pulmonary disease) (HCC)    Depression    Schizophrenia (HCC)    Substance abuse (HCC)     Past Surgical History:  Procedure Laterality Date   NO PAST SURGERIES      Family History  Problem Relation Age of Onset   Hypertension Other    Diabetes Other     Social History   Socioeconomic History   Marital status: Single  Spouse name: Not on file   Number of children: Not on file   Years of education: Not on file   Highest education level: Not on file  Occupational History   Occupation: [employer]  Tobacco Use   Smoking status: Every Day    Packs/day: .5    Types: Cigarettes   Smokeless tobacco: Never  Vaping Use   Vaping Use: Never used  Substance and Sexual Activity   Alcohol use: Yes    Alcohol/week: 12.0 standard drinks of alcohol    Types: 12 Cans of beer per week   Drug use: Yes    Types: "Crack" cocaine, Cocaine    Comment: one gram daily   Sexual activity: Yes  Other Topics Concern   Not on file  Social History Narrative   The patient was born and raised in with that by both his biological parents. His father's past way but his mother still living. He denies any history of any physical or sexual abuse. He says he completed 2 years of college at [employer]. 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 He has never been married but has a 26 year old son  who lives with his mother. He says he does get to see his son and has a relationship with him. He is not currently dating or in a relationship.      The patient does have a history of a DUI and had a court date last Friday for a DUI. He denies any other pending charges.            Social Determinants of Health   Financial Resource Strain: Not on file  Food Insecurity: Food Insecurity Present (05/28/2022)   Hunger Vital Sign    Worried About Running Out of Food in the Last Year: Often true    Ran Out of Food in the Last Year: Often true  Transportation Needs: Unmet Transportation Needs (05/28/2022)   PRAPARE - Administrator, Civil Service (Medical): Yes    Lack of Transportation (Non-Medical): Yes  Physical Activity: Not on file  Stress: Not on file  Social Connections: Not on file  Intimate Partner Violence: Not At Risk (05/28/2022)   Humiliation, Afraid, Rape, and Kick questionnaire    Fear of Current or Ex-Partner: No    Emotionally Abused: No    Physically Abused: No    Sexually Abused: No    Review of Systems  Constitutional:  Negative for chills and fever.  HENT: Negative.    Eyes: Negative.   Respiratory:  Positive for shortness of breath. Negative for cough and wheezing.   Cardiovascular:  Positive for palpitations. Negative for chest pain.  Gastrointestinal: Negative.   Genitourinary: Negative.   Musculoskeletal: Negative.   Skin: Negative.   Neurological: Negative.   Endo/Heme/Allergies: Negative.   Psychiatric/Behavioral: Negative.          Objective    BP 120/79 (BP Location: Left Arm, Patient Position: Sitting, Cuff Size: Large)   Pulse (!) 56   Ht 5\' 10"  (1.778 m)   Wt 157 lb (71.2 kg)   SpO2 98%   BMI 22.53 kg/m   Physical Exam Vitals and nursing note reviewed.  Constitutional:      Appearance: Normal appearance.  HENT:     Head: Normocephalic and atraumatic.     Right Ear: External ear normal.     Left Ear: External ear normal.  Nose: Nose normal.     Mouth/Throat:     Mouth: Mucous membranes are moist.     Pharynx: Oropharynx is clear.  Eyes:     Extraocular Movements: Extraocular movements intact.     Conjunctiva/sclera: Conjunctivae normal.     Pupils: Pupils are equal, round, and reactive to light.  Cardiovascular:     Rate and Rhythm: Rhythm irregular.     Comments: Extra beats noted Pulmonary:     Effort: Pulmonary effort is normal.     Breath sounds: Normal breath sounds.  Musculoskeletal:        General: Normal range of motion.     Cervical back: Normal range of motion and neck supple.  Skin:    General: Skin is warm and dry.  Neurological:     General: No focal deficit present.     Mental Status: He is alert and oriented to person, place, and time.  Psychiatric:        Mood and Affect: Mood normal.        Behavior: Behavior normal.        Thought Content: Thought content normal.        Judgment: Judgment normal.       Assessment & Plan:   Problem List Items Addressed This Visit       Cardiovascular and Mediastinum   PVC (premature ventricular contraction)   Relevant Medications   metoprolol tartrate (LOPRESSOR) 25 MG tablet   Other Relevant Orders   TSH     Respiratory   Chronic obstructive pulmonary disease (HCC)   Relevant Medications   fluticasone-salmeterol (ADVAIR DISKUS) 250-50 MCG/ACT AEPB   albuterol (VENTOLIN HFA) 108 (90 Base) MCG/ACT inhaler     Other   Bipolar I disorder, most recent episode depressed (HCC) - Primary   Relevant Orders   Vitamin D, 25-hydroxy   Alcohol use disorder, severe, dependence (HCC)   Cocaine use disorder, severe, dependence (HCC)   Tobacco abuse   Generalized anxiety disorder   Solitary pulmonary nodule   Prediabetes   Relevant Orders   HgB A1c (Completed)   Abnormal echocardiogram   Relevant Orders   Ambulatory referral to Cardiology   Other Visit Diagnoses     Screening, lipid       Relevant Orders   Lipid panel    Screening PSA (prostate specific antigen)       Relevant Orders   PSA      1. Bipolar I disorder, most recent episode depressed (HCC) Managed by [facility] psychiatry. - Vitamin D, 25-hydroxy; Future  2. Generalized anxiety disorder Managed by [facility] psychiatry.   3. Chronic obstructive pulmonary disease, unspecified COPD type (HCC) Resume regimen.  Patient education given on supportive care - fluticasone-salmeterol (ADVAIR DISKUS) 250-50 MCG/ACT AEPB; Inhale 1 puff into the lungs in the morning and at bedtime.  Dispense: 60 each; Refill: 1 - albuterol (VENTOLIN HFA) 108 (90 Base) MCG/ACT inhaler; Inhale 2 puffs into the lungs every 6 (six) hours as needed for wheezing or shortness of breath.  Dispense: 6.7 g; Refill: 2  4. PVC (premature ventricular contraction) Resume regimen.  Patient encouraged to check blood pressure and pulse on a daily basis, keep a written log and have available for all office visits.  Referral to cardiology created  Patient to follow-up with mobile unit in 2 weeks.  Red flags given for prompt reevaluation.  - metoprolol tartrate (LOPRESSOR) 25 MG tablet; Take 0.5 tablets (12.5 mg total) by mouth 2 (two) times daily.  Dispense: 15 tablet; Refill: 1 - TSH; Future  5. Solitary pulmonary nodule Patient education given on monitoring guidelines.  Patient should have imaging repeated in 6 months to 1 year.  Patient is high risk due to 30-pack-year smoking history.  Patient understands and agrees.  6. Prediabetes A1c 5.9.  Patient encouraged to follow low sugar diet. - HgB A1c  7. Abnormal echocardiogram  - Ambulatory referral to Cardiology  8. Cocaine use disorder, severe, dependence (HCC) Currently in substance abuse treatment program  9. Tobacco abuse   10. Alcohol use disorder, severe, dependence (HCC)   11. Screening, lipid Patient scheduled [facility] - Lipid panel;  Future  12. Screening PSA (prostate specific antigen)  - PSA; Future    I have reviewed the patient's medical history (PMH, PSH, Social History, Family History, Medications, and allergies) , and have been updated if relevant. I spent 40 minutes reviewing chart and  face to face time with patient.    No AVS created, patient does not have access to MyChart at this time, no printer available on screening van. Return in about 2 weeks (around 07/26/2022) for With MMU.   Kasandra Knudsen Mayers, PA-C

## 2022-07-13 ENCOUNTER — Other Ambulatory Visit: Payer: Self-pay

## 2022-07-13 DIAGNOSIS — R931 Abnormal findings on diagnostic imaging of heart and coronary circulation: Secondary | ICD-10-CM | POA: Insufficient documentation

## 2022-07-13 DIAGNOSIS — R911 Solitary pulmonary nodule: Secondary | ICD-10-CM | POA: Insufficient documentation

## 2022-07-13 DIAGNOSIS — I493 Ventricular premature depolarization: Secondary | ICD-10-CM | POA: Insufficient documentation

## 2022-07-13 DIAGNOSIS — R7303 Prediabetes: Secondary | ICD-10-CM | POA: Insufficient documentation

## 2022-07-20 ENCOUNTER — Telehealth: Payer: Self-pay

## 2022-07-20 NOTE — Telephone Encounter (Signed)
Labs not completed on 7.10.2024, Patients residency verified through [facility] recovery center staff, Patient is no longer residing at treatment center as of 07.16.2024.

## 2022-07-28 ENCOUNTER — Other Ambulatory Visit: Payer: Self-pay

## 2022-07-28 ENCOUNTER — Encounter (HOSPITAL_COMMUNITY): Payer: Self-pay | Admitting: Emergency Medicine

## 2022-07-28 ENCOUNTER — Emergency Department (HOSPITAL_COMMUNITY)
Admission: EM | Admit: 2022-07-28 | Discharge: 2022-07-28 | Discharge status: Against Medical Advice | Payer: Medicaid Other | Attending: Emergency Medicine | Admitting: Emergency Medicine

## 2022-07-28 DIAGNOSIS — R0602 Shortness of breath: Secondary | ICD-10-CM | POA: Insufficient documentation

## 2022-07-28 DIAGNOSIS — Z5321 Procedure and treatment not carried out due to patient leaving prior to being seen by health care provider: Secondary | ICD-10-CM | POA: Insufficient documentation

## 2022-07-28 DIAGNOSIS — J449 Chronic obstructive pulmonary disease, unspecified: Secondary | ICD-10-CM | POA: Diagnosis not present

## 2022-07-28 NOTE — ED Triage Notes (Signed)
Per [facility] pt coming from side of road called out for shortness of breath. Hx of asthma and copd. States has not had his medicine for a while. Given 7.5 mg albuterol. Patient ambulatory on arrival and in NAD.

## 2022-07-28 NOTE — ED Notes (Signed)
Patient left stating he fells better and does not want to stay to be evaluated.

## 2022-09-24 ENCOUNTER — Emergency Department (HOSPITAL_COMMUNITY): Payer: MEDICAID

## 2022-09-24 ENCOUNTER — Emergency Department (HOSPITAL_COMMUNITY)
Admission: EM | Admit: 2022-09-24 | Discharge: 2022-09-24 | Disposition: A | Discharge status: Home / Self Care | Payer: MEDICAID | Attending: Emergency Medicine | Admitting: Emergency Medicine

## 2022-09-24 ENCOUNTER — Other Ambulatory Visit: Payer: Self-pay

## 2022-09-24 ENCOUNTER — Encounter (HOSPITAL_COMMUNITY): Payer: Self-pay

## 2022-09-24 DIAGNOSIS — J449 Chronic obstructive pulmonary disease, unspecified: Secondary | ICD-10-CM | POA: Diagnosis not present

## 2022-09-24 DIAGNOSIS — Z1152 Encounter for screening for COVID-19: Secondary | ICD-10-CM | POA: Diagnosis not present

## 2022-09-24 DIAGNOSIS — R0602 Shortness of breath: Secondary | ICD-10-CM | POA: Diagnosis present

## 2022-09-24 LAB — TROPONIN I (HIGH SENSITIVITY): Troponin I (High Sensitivity): 5 ng/L (ref <18)

## 2022-09-24 LAB — BASIC METABOLIC PANEL
Anion gap: 11 (ref 5–15)
BUN: 10 mg/dL (ref 6–20)
CO2: 27 mmol/L (ref 22–32)
Calcium: 9.1 mg/dL (ref 8.9–10.3)
Chloride: 101 mmol/L (ref 98–111)
Creatinine, Ser: 0.98 mg/dL (ref 0.61–1.24)
GFR, Estimated: >60 mL/min (ref >60)
Glucose, Bld: 121 mg/dL — ABNORMAL HIGH (ref 70–99)
Potassium: 4 mmol/L (ref 3.5–5.1)
Sodium: 139 mmol/L (ref 135–145)

## 2022-09-24 LAB — CBC WITH DIFFERENTIAL/PLATELET
Abs Immature Granulocytes: 0.02 10*3/uL (ref 0.00–0.07)
Basophils Absolute: 0.1 10*3/uL (ref 0.0–0.1)
Basophils Relative: 1 %
Eosinophils Absolute: 0.3 10*3/uL (ref 0.0–0.5)
Eosinophils Relative: 3 %
HCT: 42.7 % (ref 39.0–52.0)
Hemoglobin: 14.3 g/dL (ref 13.0–17.0)
Immature Granulocytes: 0 %
Lymphocytes Relative: 27 %
Lymphs Abs: 2.2 10*3/uL (ref 0.7–4.0)
MCH: 29.9 pg (ref 26.0–34.0)
MCHC: 33.5 g/dL (ref 30.0–36.0)
MCV: 89.3 fL (ref 80.0–100.0)
Monocytes Absolute: 0.7 10*3/uL (ref 0.1–1.0)
Monocytes Relative: 8 %
Neutro Abs: 4.8 10*3/uL (ref 1.7–7.7)
Neutrophils Relative %: 61 %
Platelets: 333 10*3/uL (ref 150–400)
RBC: 4.78 MIL/uL (ref 4.22–5.81)
RDW: 14 % (ref 11.5–15.5)
WBC: 8 10*3/uL (ref 4.0–10.5)
nRBC: 0 % (ref 0.0–0.2)

## 2022-09-24 LAB — RESP PANEL BY RT-PCR (RSV, FLU A&B, COVID)  RVPGX2
Influenza A by PCR: NEGATIVE
Influenza B by PCR: NEGATIVE
Resp Syncytial Virus by PCR: NEGATIVE
SARS Coronavirus 2 by RT PCR: NEGATIVE

## 2022-09-24 MED ORDER — ALBUTEROL SULFATE HFA 108 (90 BASE) MCG/ACT IN AERS
2.0000 | INHALATION_SPRAY | Freq: Once | RESPIRATORY_TRACT | Status: AC
  Administered 2022-09-24: 2 via RESPIRATORY_TRACT
  Filled 2022-09-24: qty 6.7

## 2022-09-24 MED ORDER — ALBUTEROL SULFATE HFA 108 (90 BASE) MCG/ACT IN AERS
1.0000 | INHALATION_SPRAY | Freq: Four times a day (QID) | RESPIRATORY_TRACT | 2 refills | Status: DC | PRN
Start: 2022-09-24 — End: 2022-10-24
  Filled 2022-09-24: qty 6.7, 25d supply, fill #0

## 2022-09-24 NOTE — Discharge Instructions (Addendum)
Please continue to use the albuterol inhaler as prescribed. I recommend close follow-up with PCP for reevaluation.  Please do not hesitate to return to emergency department if worrisome signs symptoms we discussed become apparent.

## 2022-09-24 NOTE — ED Provider Notes (Addendum)
[place] EMERGENCY DEPARTMENT AT [facility] Provider Note   CSN: [id number] Arrival date & time: 09/24/22  4098     History  Chief Complaint  Patient presents with   Shortness of Breath    Patient to ED cvia EMS with complaint of shortness of breath x 3 days. EMS reports patient was at the circle K and 96% on room air but wheezing. Patient arrives wioth nebulizer treatment in proccess  atrovent, 5 mg albuterol EMS reports giving Patiernt 125 mg solu medrol IV in route    Franklin [name] is a 52 y.o. male with a past medical history of COPD, substance abuse presents today for evaluation of shortness of breath.  Patient reports shortness of breath 30 minutes upon arrival.  History of COPD and has run out of his albuterol inhaler for 3 days.  He denies any chest pain, fever, nausea, vomiting, abdominal pain, urinary symptoms, bowel changes, rash.  He endorses cough and runny nose.   Shortness of Breath   Past Medical History:  Diagnosis Date   Anxiety    Bipolar 1 disorder (HCC)    COPD (chronic obstructive pulmonary disease) (HCC)    Depression    Schizophrenia (HCC)    Substance abuse (HCC)    Past Surgical History:  Procedure Laterality Date   NO PAST SURGERIES       Home Medications Prior to Admission medications   Medication Sig Start Date End Date Taking? Authorizing Provider  albuterol (VENTOLIN HFA) 108 (90 Base) MCG/ACT inhaler Inhale 2 puffs into the lungs every 6 (six) hours as needed for wheezing or shortness of breath. 07/12/22   Mayers, Cari S, PA-C  fluticasone-salmeterol (ADVAIR DISKUS) 250-50 MCG/ACT AEPB Inhale 1 puff into the lungs in the morning and at bedtime. 07/12/22   Mayers, Cari S, PA-C  metoprolol tartrate (LOPRESSOR) 25 MG tablet Take 0.5 tablets (12.5 mg total) by mouth 2 (two) times daily. 07/12/22 07/12/23  Mayers, Kasandra Knudsen, PA-C      Allergies    Patient has no known allergies.    Review of Systems   Review of Systems  Respiratory:   Positive for shortness of breath.     Physical Exam Updated Vital Signs BP (!) 152/88   Pulse 92   Temp 97.7 F (36.5 C)   Resp 20   Ht 5\' 10"  (1.778 m)   Wt 71 kg   SpO2 100%   BMI 22.46 kg/m  Physical Exam Vitals and nursing note reviewed.  Constitutional:      Appearance: Normal appearance.  HENT:     Head: Normocephalic and atraumatic.     Mouth/Throat:     Mouth: Mucous membranes are moist.  Eyes:     General: No scleral icterus. Cardiovascular:     Rate and Rhythm: Normal rate and regular rhythm.     Pulses: Normal pulses.     Heart sounds: Normal heart sounds.  Pulmonary:     Effort: Pulmonary effort is normal.     Breath sounds: Wheezing (mild) present.  Abdominal:     General: Abdomen is flat.     Palpations: Abdomen is soft.     Tenderness: There is no abdominal tenderness.  Musculoskeletal:        General: No deformity.  Skin:    General: Skin is warm.     Findings: No rash.  Neurological:     General: No focal deficit present.     Mental Status: He is alert.  Psychiatric:  Mood and Affect: Mood normal.     ED Results / Procedures / Treatments   Labs (all labs ordered are listed, but only abnormal results are displayed) Labs Reviewed  RESP PANEL BY RT-PCR (RSV, FLU A&B, COVID)  [id number]  CBC WITH DIFFERENTIAL/PLATELET  BASIC METABOLIC PANEL  TROPONIN I (HIGH SENSITIVITY)    EKG None  Radiology No results found.  Procedures Procedures    Medications Ordered in ED Medications - No data to display  ED Course/ Medical Decision Making/ A&P                                 Medical Decision Making Amount and/or Complexity of Data Reviewed Labs: ordered.  Risk Prescription drug management.   This patient presents to the ED for shortness of breath, this involves an extensive number of treatment options, and is a complaint that carries with a high risk of complications and morbidity.  The differential diagnosis includes CHF/ACS,  COPD asthma, pneumonia, anaphylaxis, PE, pneumothorax, anxiety, COVID-19.  This is not an exhaustive list.  Lab tests: I ordered and personally interpreted labs.  The pertinent results include: WBC unremarkable. Hbg unremarkable. Platelets unremarkable. Electrolytes unremarkable. BUN, creatinine unremarkable.  Viral panel is negative.  Troponin is negative.  Imaging studies: I ordered imaging studies, personally reviewed, interpreted imaging and agree with the radiologist's interpretations. The results include: Negative chest x-ray.  Problem list/ ED course/ Critical interventions/ Medical management: HPI: See above Vital signs within normal range and stable throughout visit. Laboratory/imaging studies significant for: See above. On physical examination, patient is afebrile and appears in no acute distress.  There was mild wheezing on auscultation.  Symptoms are most likely secondary to viral URI versus COPD exacerbation.  Unlikely ACS/MI, EKG without any ischemic changes and troponin was negative.  No evidence of volume overload so doubt CHF.  Chest x-ray showed no pneumonia or pneumothorax.  Unlikely PE, patient is Wells low risk, O2 sat 97% on room air.  He is not tachycardic.  Given albuterol inhaler here.  Reevaluation of patient after this medication showed that the patient improved.  Will send an Rx of albuterol inhaler to the pharmacy. Advised patient to see his primary care physician for reevaluation and management.  Strict ED return precaution discussed.  Patient is stable for discharge. I have reviewed the patient home medicines and have made adjustments as needed.  Cardiac monitoring/EKG: The patient was maintained on a cardiac monitor.  I personally reviewed and interpreted the cardiac monitor which showed an underlying rhythm of: sinus rhythm.  Additional history obtained: External records from outside source obtained and reviewed including: Chart review including previous notes,  labs, imaging.  Consultations obtained:  Disposition Continued outpatient therapy. Follow-up with PCP recommended for reevaluation of symptoms. Treatment plan discussed with patient.  Pt acknowledged understanding was agreeable to the plan. Worrisome signs and symptoms were discussed with patient, and patient acknowledged understanding to return to the ED if they noticed these signs and symptoms. Patient was stable upon discharge.   This chart was dictated using voice recognition software.  Despite best efforts to proofread,  errors can occur which can change the documentation meaning.          Final Clinical Impression(s) / ED Diagnoses Final diagnoses:  Shortness of breath    Rx / DC Orders ED Discharge Orders          Ordered    albuterol (VENTOLIN  HFA) 108 (90 Base) MCG/ACT inhaler  Every 6 hours PRN        09/24/22 0855              Jeanelle Malling, PA 09/24/22 0901    Jeanelle Malling, PA 09/24/22 0901    Rexford Maus, DO 09/24/22 [phone]

## 2022-10-05 ENCOUNTER — Other Ambulatory Visit: Payer: Self-pay

## 2022-10-22 ENCOUNTER — Emergency Department: Payer: MEDICAID

## 2022-10-22 ENCOUNTER — Other Ambulatory Visit: Payer: Self-pay

## 2022-10-22 ENCOUNTER — Encounter: Payer: Self-pay | Admitting: Emergency Medicine

## 2022-10-22 ENCOUNTER — Inpatient Hospital Stay
Admission: EM | Admit: 2022-10-22 | Discharge: 2022-10-24 | DRG: 190 | Disposition: A | Discharge status: Home / Self Care | Payer: MEDICAID | Attending: Internal Medicine | Admitting: Internal Medicine

## 2022-10-22 ENCOUNTER — Inpatient Hospital Stay: Payer: MEDICAID

## 2022-10-22 DIAGNOSIS — F319 Bipolar disorder, unspecified: Secondary | ICD-10-CM | POA: Diagnosis present

## 2022-10-22 DIAGNOSIS — Z8249 Family history of ischemic heart disease and other diseases of the circulatory system: Secondary | ICD-10-CM | POA: Diagnosis not present

## 2022-10-22 DIAGNOSIS — F1020 Alcohol dependence, uncomplicated: Secondary | ICD-10-CM | POA: Diagnosis present

## 2022-10-22 DIAGNOSIS — J441 Chronic obstructive pulmonary disease with (acute) exacerbation: Secondary | ICD-10-CM | POA: Diagnosis not present

## 2022-10-22 DIAGNOSIS — F10939 Alcohol use, unspecified with withdrawal, unspecified: Secondary | ICD-10-CM | POA: Insufficient documentation

## 2022-10-22 DIAGNOSIS — Z79899 Other long term (current) drug therapy: Secondary | ICD-10-CM | POA: Diagnosis not present

## 2022-10-22 DIAGNOSIS — Z1152 Encounter for screening for COVID-19: Secondary | ICD-10-CM | POA: Diagnosis not present

## 2022-10-22 DIAGNOSIS — R451 Restlessness and agitation: Secondary | ICD-10-CM | POA: Diagnosis present

## 2022-10-22 DIAGNOSIS — F1420 Cocaine dependence, uncomplicated: Secondary | ICD-10-CM | POA: Diagnosis present

## 2022-10-22 DIAGNOSIS — R000 Tachycardia, unspecified: Secondary | ICD-10-CM | POA: Diagnosis present

## 2022-10-22 DIAGNOSIS — F10239 Alcohol dependence with withdrawal, unspecified: Secondary | ICD-10-CM | POA: Diagnosis present

## 2022-10-22 DIAGNOSIS — F209 Schizophrenia, unspecified: Secondary | ICD-10-CM | POA: Diagnosis present

## 2022-10-22 DIAGNOSIS — F419 Anxiety disorder, unspecified: Secondary | ICD-10-CM | POA: Diagnosis present

## 2022-10-22 DIAGNOSIS — Z7951 Long term (current) use of inhaled steroids: Secondary | ICD-10-CM | POA: Diagnosis not present

## 2022-10-22 DIAGNOSIS — F1410 Cocaine abuse, uncomplicated: Secondary | ICD-10-CM

## 2022-10-22 DIAGNOSIS — F17200 Nicotine dependence, unspecified, uncomplicated: Secondary | ICD-10-CM | POA: Diagnosis present

## 2022-10-22 DIAGNOSIS — D72829 Elevated white blood cell count, unspecified: Secondary | ICD-10-CM | POA: Diagnosis present

## 2022-10-22 DIAGNOSIS — Z833 Family history of diabetes mellitus: Secondary | ICD-10-CM

## 2022-10-22 DIAGNOSIS — I110 Hypertensive heart disease with heart failure: Secondary | ICD-10-CM | POA: Diagnosis present

## 2022-10-22 DIAGNOSIS — Z720 Tobacco use: Secondary | ICD-10-CM | POA: Diagnosis not present

## 2022-10-22 DIAGNOSIS — J449 Chronic obstructive pulmonary disease, unspecified: Secondary | ICD-10-CM | POA: Insufficient documentation

## 2022-10-22 DIAGNOSIS — J9601 Acute respiratory failure with hypoxia: Secondary | ICD-10-CM | POA: Diagnosis present

## 2022-10-22 DIAGNOSIS — I5022 Chronic systolic (congestive) heart failure: Secondary | ICD-10-CM | POA: Diagnosis present

## 2022-10-22 LAB — BLOOD GAS, VENOUS
Acid-Base Excess: 3.5 mmol/L — ABNORMAL HIGH (ref 0.0–2.0)
Bicarbonate: 30.8 mmol/L — ABNORMAL HIGH (ref 20.0–28.0)
O2 Saturation: 54.4 %
Patient temperature: 37
pCO2, Ven: 57 mm[Hg] (ref 44–60)
pH, Ven: 7.34 (ref 7.25–7.43)
pO2, Ven: 37 mm[Hg] (ref 32–45)

## 2022-10-22 LAB — CBC WITH DIFFERENTIAL/PLATELET
Abs Immature Granulocytes: 0.04 10*3/uL (ref 0.00–0.07)
Basophils Absolute: 0.1 10*3/uL (ref 0.0–0.1)
Basophils Relative: 1 %
Eosinophils Absolute: 0.7 10*3/uL — ABNORMAL HIGH (ref 0.0–0.5)
Eosinophils Relative: 5 %
HCT: 44.3 % (ref 39.0–52.0)
Hemoglobin: 14.4 g/dL (ref 13.0–17.0)
Immature Granulocytes: 0 %
Lymphocytes Relative: 14 %
Lymphs Abs: 1.7 10*3/uL (ref 0.7–4.0)
MCH: 29.7 pg (ref 26.0–34.0)
MCHC: 32.5 g/dL (ref 30.0–36.0)
MCV: 91.3 fL (ref 80.0–100.0)
Monocytes Absolute: 1 10*3/uL (ref 0.1–1.0)
Monocytes Relative: 8 %
Neutro Abs: 8.5 10*3/uL — ABNORMAL HIGH (ref 1.7–7.7)
Neutrophils Relative %: 72 %
Platelets: 279 10*3/uL (ref 150–400)
RBC: 4.85 MIL/uL (ref 4.22–5.81)
RDW: 13.9 % (ref 11.5–15.5)
WBC: 11.9 10*3/uL — ABNORMAL HIGH (ref 4.0–10.5)
nRBC: 0 % (ref 0.0–0.2)

## 2022-10-22 LAB — BLOOD GAS, ARTERIAL
Acid-Base Excess: 0.7 mmol/L (ref 0.0–2.0)
Bicarbonate: 25.4 mmol/L (ref 20.0–28.0)
O2 Content: 4 L/min
O2 Saturation: 99.3 %
Patient temperature: 37
pCO2 arterial: 40 mm[Hg] (ref 32–48)
pH, Arterial: 7.41 (ref 7.35–7.45)
pO2, Arterial: 124 mm[Hg] — ABNORMAL HIGH (ref 83–108)

## 2022-10-22 LAB — TROPONIN I (HIGH SENSITIVITY)
Troponin I (High Sensitivity): 5 ng/L (ref <18)
Troponin I (High Sensitivity): 5 ng/L (ref <18)

## 2022-10-22 LAB — COMPREHENSIVE METABOLIC PANEL
ALT: 19 U/L (ref 0–44)
AST: 22 U/L (ref 15–41)
Albumin: 4.4 g/dL (ref 3.5–5.0)
Alkaline Phosphatase: 69 U/L (ref 38–126)
Anion gap: 8 (ref 5–15)
BUN: 14 mg/dL (ref 6–20)
CO2: 27 mmol/L (ref 22–32)
Calcium: 8.9 mg/dL (ref 8.9–10.3)
Chloride: 104 mmol/L (ref 98–111)
Creatinine, Ser: 0.76 mg/dL (ref 0.61–1.24)
GFR, Estimated: >60 mL/min (ref >60)
Glucose, Bld: 128 mg/dL — ABNORMAL HIGH (ref 70–99)
Potassium: 3.5 mmol/L (ref 3.5–5.1)
Sodium: 139 mmol/L (ref 135–145)
Total Bilirubin: 1.1 mg/dL (ref 0.3–1.2)
Total Protein: 7.8 g/dL (ref 6.5–8.1)

## 2022-10-22 LAB — GLUCOSE, CAPILLARY: Glucose-Capillary: 198 mg/dL — ABNORMAL HIGH (ref 70–99)

## 2022-10-22 MED ORDER — THIAMINE MONONITRATE 100 MG PO TABS
100.0000 mg | ORAL_TABLET | Freq: Every day | ORAL | Status: DC
  Administered 2022-10-22 – 2022-10-24 (×3): 100 mg via ORAL
  Filled 2022-10-22 (×3): qty 1

## 2022-10-22 MED ORDER — MAGNESIUM SULFATE 2 GM/50ML IV SOLN
2.0000 g | Freq: Once | INTRAVENOUS | Status: AC
  Administered 2022-10-22: 2 g via INTRAVENOUS
  Filled 2022-10-22: qty 50

## 2022-10-22 MED ORDER — METHYLPREDNISOLONE SODIUM SUCC 125 MG IJ SOLR
125.0000 mg | Freq: Once | INTRAMUSCULAR | Status: AC
  Administered 2022-10-22: 125 mg via INTRAVENOUS
  Filled 2022-10-22: qty 2

## 2022-10-22 MED ORDER — BUDESONIDE 0.5 MG/2ML IN SUSP
2.0000 mg | Freq: Two times a day (BID) | RESPIRATORY_TRACT | Status: DC
  Administered 2022-10-22 – 2022-10-23 (×4): 2 mg via RESPIRATORY_TRACT
  Filled 2022-10-22 (×4): qty 8

## 2022-10-22 MED ORDER — GUAIFENESIN ER 600 MG PO TB12
1200.0000 mg | ORAL_TABLET | Freq: Two times a day (BID) | ORAL | Status: DC
  Administered 2022-10-22 – 2022-10-24 (×5): 1200 mg via ORAL
  Filled 2022-10-22 (×5): qty 2

## 2022-10-22 MED ORDER — DOXYCYCLINE HYCLATE 100 MG PO TABS
100.0000 mg | ORAL_TABLET | Freq: Two times a day (BID) | ORAL | Status: DC
  Administered 2022-10-22 (×2): 100 mg via ORAL
  Filled 2022-10-22 (×3): qty 1

## 2022-10-22 MED ORDER — LORAZEPAM 2 MG/ML IJ SOLN
1.0000 mg | Freq: Once | INTRAMUSCULAR | Status: AC
  Administered 2022-10-22: 1 mg via INTRAVENOUS
  Filled 2022-10-22: qty 1

## 2022-10-22 MED ORDER — PREDNISONE 20 MG PO TABS
40.0000 mg | ORAL_TABLET | Freq: Every day | ORAL | Status: DC
  Administered 2022-10-22 – 2022-10-24 (×3): 40 mg via ORAL
  Filled 2022-10-22 (×3): qty 2

## 2022-10-22 MED ORDER — ADULT MULTIVITAMIN W/MINERALS CH
1.0000 | ORAL_TABLET | Freq: Every day | ORAL | Status: DC
  Administered 2022-10-23: 1 via ORAL
  Filled 2022-10-22: qty 1

## 2022-10-22 MED ORDER — LORAZEPAM 2 MG PO TABS
2.0000 mg | ORAL_TABLET | Freq: Three times a day (TID) | ORAL | Status: AC
  Filled 2022-10-22: qty 1

## 2022-10-22 MED ORDER — IPRATROPIUM-ALBUTEROL 0.5-2.5 (3) MG/3ML IN SOLN
3.0000 mL | Freq: Four times a day (QID) | RESPIRATORY_TRACT | Status: DC
  Administered 2022-10-22 – 2022-10-24 (×9): 3 mL via RESPIRATORY_TRACT
  Filled 2022-10-22 (×9): qty 3

## 2022-10-22 MED ORDER — ENOXAPARIN SODIUM 40 MG/0.4ML IJ SOSY
40.0000 mg | PREFILLED_SYRINGE | INTRAMUSCULAR | Status: DC
  Filled 2022-10-22 (×3): qty 0.4

## 2022-10-22 MED ORDER — FOLIC ACID 1 MG PO TABS
1.0000 mg | ORAL_TABLET | Freq: Every day | ORAL | Status: DC
  Administered 2022-10-22 – 2022-10-24 (×3): 1 mg via ORAL
  Filled 2022-10-22 (×3): qty 1

## 2022-10-22 MED ORDER — OLANZAPINE 10 MG IM SOLR: 10.0000 mg | Freq: Once | INTRAMUSCULAR | Status: DC | PRN

## 2022-10-22 MED ORDER — IPRATROPIUM-ALBUTEROL 0.5-2.5 (3) MG/3ML IN SOLN
6.0000 mL | Freq: Once | RESPIRATORY_TRACT | Status: AC
  Administered 2022-10-22: 6 mL via RESPIRATORY_TRACT
  Filled 2022-10-22: qty 3

## 2022-10-22 MED ORDER — LORAZEPAM 1 MG PO TABS
1.0000 mg | ORAL_TABLET | ORAL | Status: DC | PRN
  Administered 2022-10-22 – 2022-10-23 (×2): 2 mg via ORAL
  Filled 2022-10-22 (×2): qty 2

## 2022-10-22 MED ORDER — HALOPERIDOL LACTATE 5 MG/ML IJ SOLN
5.0000 mg | Freq: Once | INTRAMUSCULAR | Status: AC
  Administered 2022-10-22: 5 mg via INTRAVENOUS

## 2022-10-22 MED ORDER — IPRATROPIUM-ALBUTEROL 0.5-2.5 (3) MG/3ML IN SOLN
3.0000 mL | Freq: Once | RESPIRATORY_TRACT | Status: AC
  Administered 2022-10-22: 3 mL via RESPIRATORY_TRACT
  Filled 2022-10-22: qty 3

## 2022-10-22 MED ORDER — THIAMINE HCL 100 MG/ML IJ SOLN: 100.0000 mg | Freq: Every day | INTRAMUSCULAR | Status: DC

## 2022-10-22 MED ORDER — ALBUTEROL SULFATE (2.5 MG/3ML) 0.083% IN NEBU
3.0000 mL | INHALATION_SOLUTION | Freq: Four times a day (QID) | RESPIRATORY_TRACT | Status: DC | PRN
  Administered 2022-10-22 – 2022-10-24 (×3): 3 mL via RESPIRATORY_TRACT
  Filled 2022-10-22 (×2): qty 3
  Filled 2022-10-22: qty 9

## 2022-10-22 MED ORDER — METOPROLOL TARTRATE 25 MG PO TABS: 12.5000 mg | ORAL_TABLET | Freq: Two times a day (BID) | ORAL | Status: DC

## 2022-10-22 MED ORDER — LORAZEPAM 2 MG/ML IJ SOLN
1.0000 mg | INTRAMUSCULAR | Status: DC | PRN
  Administered 2022-10-23: 2 mg via INTRAVENOUS
  Filled 2022-10-22: qty 1

## 2022-10-22 MED ORDER — CARVEDILOL 3.125 MG PO TABS
3.1250 mg | ORAL_TABLET | Freq: Two times a day (BID) | ORAL | Status: DC
  Administered 2022-10-22 – 2022-10-24 (×4): 3.125 mg via ORAL
  Filled 2022-10-22 (×5): qty 1

## 2022-10-22 MED ORDER — MOMETASONE FURO-FORMOTEROL FUM 200-5 MCG/ACT IN AERO
2.0000 | INHALATION_SPRAY | Freq: Two times a day (BID) | RESPIRATORY_TRACT | Status: DC
  Administered 2022-10-23 – 2022-10-24 (×2): 2 via RESPIRATORY_TRACT
  Filled 2022-10-22 (×2): qty 8.8

## 2022-10-22 NOTE — Progress Notes (Signed)
RRT was called for altered mentation patient became very agitated tachypneic tachycardia and hypoxic.  Went to see patient at bedside, patient agitated, pulmonary examination showed again diminished breathing sound and diffused wheezing.  Stat ABG showed 7.41/40/124, chest x-ray no acute finding.  Clinical impression is patient is withdrawing from alcohol, given that the patient has severe COPD, we will try to avoid IV Ativan at least today.  Start p.o. Ativan 2 mg 3 times daily, haloperidol x 1 and Zyprexa x 1 given.

## 2022-10-22 NOTE — ED Triage Notes (Signed)
BIBA Per EMS: Pt SHOB w/ wheezing. Hx COPD. Pt also reports coughing up dark yellow sputum recently. Pt given en route:  10mg  albuterol  0.5 Atrovent

## 2022-10-22 NOTE — Progress Notes (Signed)
Patient noted to be sleeping at this time. Response to voice.

## 2022-10-22 NOTE — H&P (Signed)
History and Physical    Franklin [name] ZOX:[id number] DOB: Apr 23, 1970 DOA: 10/22/2022  PCP: Patient, No Pcp Per (Confirm with patient/family/NH records and if not entered, this has to be entered at [facility] point of entry) Patient coming from: Home  I have personally briefly reviewed patient's old medical records in [facility]  Chief Complaint: Wheezing, SOB  HPI: Franklin [name] is a 52 y.o. male with medical history significant of COPD, HTN, chronic HFrEF with LVEF 50-55%/lower normal, bipolar disorder, anxiety/depression, cocaine abuse, presented with worsening of wheezing and shortness of breath.  Symptoms started 2 to 3 days ago, patient started to have wheezing and productive cough with dark yellow thick sputum, worsening overnight and came to ED.  Denied any chest pain, no fever or chills.  In the ED, patient was found to be in severe respiratory distress and BiPAP was ordered however patient did not tolerate BiPAP mask.  VBG showed 7.3 4/57/37.  Patient was given IV Solu-Medrol and multiple rounds of breathing treatment but continued to wheeze.  X-ray showed no acute infiltrates, WBC 11.9, hemoglobin 14, bicarb 27, K3.5.    Review of Systems: As per HPI otherwise 14 point review of systems negative.    Past Medical History:  Diagnosis Date   Anxiety    Bipolar 1 disorder (HCC)    COPD (chronic obstructive pulmonary disease) (HCC)    Depression    Schizophrenia (HCC)    Substance abuse (HCC)     Past Surgical History:  Procedure Laterality Date   NO PAST SURGERIES       reports that he has been smoking cigarettes. He has never used smokeless tobacco. He reports current alcohol use of about 12.0 standard drinks of alcohol per week. He reports current drug use. Drugs: "Crack" cocaine and Cocaine.  No Known Allergies  Family History  Problem Relation Age of Onset   Hypertension Other    Diabetes Other     Prior to Admission medications   Medication Sig Start Date End  Date Taking? Authorizing Provider  albuterol (VENTOLIN HFA) 108 (90 Base) MCG/ACT inhaler Inhale 2 puffs into the lungs every 6 (six) hours as needed for wheezing or shortness of breath. 07/12/22   Mayers, Cari S, PA-C  albuterol (VENTOLIN HFA) 108 (90 Base) MCG/ACT inhaler Inhale 1-2 puffs into the lungs every 6 (six) hours as needed for wheezing or shortness of breath. 09/24/22   Jeanelle Malling, PA  fluticasone-salmeterol (ADVAIR DISKUS) 250-50 MCG/ACT AEPB Inhale 1 puff into the lungs in the morning and at bedtime. 07/12/22   Mayers, Cari S, PA-C  metoprolol tartrate (LOPRESSOR) 25 MG tablet Take 0.5 tablets (12.5 mg total) by mouth 2 (two) times daily. 07/12/22 07/12/23  Mayers, Kasandra Knudsen, PA-C    Physical Exam: Vitals:   10/22/22 0459 10/22/22 0500 10/22/22 0600 10/22/22 0809  BP:   (!) 153/90   Pulse: 90  91   Resp:  (!) 22 (!) 23   Temp:      TempSrc:      SpO2: 98%  93%   Weight:    68.9 kg    Constitutional: NAD, calm, comfortable Vitals:   10/22/22 0459 10/22/22 0500 10/22/22 0600 10/22/22 0809  BP:   (!) 153/90   Pulse: 90  91   Resp:  (!) 22 (!) 23   Temp:      TempSrc:      SpO2: 98%  93%   Weight:    68.9 kg   Eyes: PERRL, lids  and conjunctivae normal ENMT: Mucous membranes are moist. Posterior pharynx clear of any exudate or lesions.Normal dentition.  Neck: normal, supple, no masses, no thyromegaly Respiratory: Diminished breathing sound bilaterally, diffused wheezing, no crackles.  Increasing respiratory effort. No accessory muscle use.  Cardiovascular: Regular rate and rhythm, no murmurs / rubs / gallops. No extremity edema. 2+ pedal pulses. No carotid bruits.  Abdomen: no tenderness, no masses palpated. No hepatosplenomegaly. Bowel sounds positive.  Musculoskeletal: no clubbing / cyanosis. No joint deformity upper and lower extremities. Good ROM, no contractures. Normal muscle tone.  Skin: no rashes, lesions, ulcers. No induration Neurologic: CN 2-12 grossly intact. Sensation  intact, DTR normal. Strength 5/5 in all 4.  Psychiatric: Normal judgment and insight. Alert and oriented x 3. Normal mood.     Labs on Admission: I have personally reviewed following labs and imaging studies  CBC: Recent Labs  Lab 10/22/22 0356  WBC 11.9*  NEUTROABS 8.5*  HGB 14.4  HCT 44.3  MCV 91.3  PLT 279   Basic Metabolic Panel: Recent Labs  Lab 10/22/22 0356  NA 139  K 3.5  CL 104  CO2 27  GLUCOSE 128*  BUN 14  CREATININE 0.76  CALCIUM 8.9   GFR: Estimated Creatinine Clearance: 105.3 mL/min (by C-G formula based on SCr of 0.76 mg/dL). Liver Function Tests: Recent Labs  Lab 10/22/22 0356  AST 22  ALT 19  ALKPHOS 69  BILITOT 1.1  PROT 7.8  ALBUMIN 4.4   No results for input(s): "LIPASE", "AMYLASE" in the last 168 hours. No results for input(s): "AMMONIA" in the last 168 hours. Coagulation Profile: No results for input(s): "INR", "PROTIME" in the last 168 hours. Cardiac Enzymes: No results for input(s): "CKTOTAL", "CKMB", "CKMBINDEX", "TROPONINI" in the last 168 hours. BNP (last 3 results) No results for input(s): "PROBNP" in the last 8760 hours. HbA1C: No results for input(s): "HGBA1C" in the last 72 hours. CBG: No results for input(s): "GLUCAP" in the last 168 hours. Lipid Profile: No results for input(s): "CHOL", "HDL", "LDLCALC", "TRIG", "CHOLHDL", "LDLDIRECT" in the last 72 hours. Thyroid Function Tests: No results for input(s): "TSH", "T4TOTAL", "FREET4", "T3FREE", "THYROIDAB" in the last 72 hours. Anemia Panel: No results for input(s): "VITAMINB12", "FOLATE", "FERRITIN", "TIBC", "IRON", "RETICCTPCT" in the last 72 hours. Urine analysis:    Component Value Date/Time   COLORURINE YELLOW 03/13/2013 1420   APPEARANCEUR CLEAR 03/13/2013 1420   APPEARANCEUR Hazy 08/25/2012 0925   LABSPEC 1.026 03/13/2013 1420   LABSPEC 1.025 08/25/2012 0925   PHURINE 5.0 03/13/2013 1420   GLUCOSEU NEGATIVE 03/13/2013 1420   GLUCOSEU Negative 08/25/2012 0925    HGBUR NEGATIVE 03/13/2013 1420   BILIRUBINUR SMALL (A) 03/13/2013 1420   BILIRUBINUR Negative 08/25/2012 0925   KETONESUR NEGATIVE 03/13/2013 1420   PROTEINUR NEGATIVE 03/13/2013 1420   UROBILINOGEN 0.2 03/13/2013 1420   NITRITE NEGATIVE 03/13/2013 1420   LEUKOCYTESUR NEGATIVE 03/13/2013 1420   LEUKOCYTESUR Negative 08/25/2012 0925    Radiological Exams on Admission: DG Chest Portable 1 View  Result Date: 10/22/2022 CLINICAL DATA:  Shortness of breath and wheezing EXAM: PORTABLE CHEST 1 VIEW COMPARISON:  09/24/2022 FINDINGS: Normal heart size and mediastinal contours. Generous lung volumes. No acute infiltrate or edema. No effusion or pneumothorax. No acute osseous findings. IMPRESSION: No active disease. Electronically Signed   By: Tiburcio Pea M.D.   On: 10/22/2022 04:42    EKG: None  Assessment/Plan Principal Problem:   COPD exacerbation (HCC) Active Problems:   COPD (chronic obstructive pulmonary disease) (HCC)  (  please populate well all problems here in Problem List. (For example, if patient is on BP meds at home and you resume or decide to hold them, it is a problem that needs to be her. Same for CAD, COPD, HLD and so on)  Acute COPD exacerbation Acute hypoxic respiratory failure -Continue prednisone daily for 5 days -Continue Dulera -DuoNebs and as needed albuterol -Incentive spirometry, flutter valve -Given there is a significant increase of sputum production, will treat with doxycycline x 5 days  Uncontrolled hypertension -Probably secondary to cocaine abuse -Change metoprolol to Coreg  Chronic HFrEF -With borderline low LVEF, currently patient appears to be euvolemic. -Blood pressure control as above    DVT prophylaxis: Lovenox Code Status: Full code Family Communication: None at bedside Disposition Plan: Patient sick with severe COPD exacerbation requiring BiPAP, and IV steroid, expect more than 2 midnight hospital stay Consults called: None Admission  status: Telemetry admission   Emeline General MD [facility] Pager [phone]  10/22/2022, 8:42 AM

## 2022-10-22 NOTE — ED Notes (Signed)
Patient given a breakfast tray.

## 2022-10-22 NOTE — Plan of Care (Signed)
  Problem: Education: Goal: Understanding of post-operative needs will improve Outcome: Progressing Goal: Individualized Educational Video(s) Outcome: Progressing   Problem: Clinical Measurements: Goal: Postoperative complications will be avoided or minimized Outcome: Progressing

## 2022-10-22 NOTE — Plan of Care (Signed)
[facility]L Tonsillectomy/Adenoidectomy, Postoperative PEDS care plan entered in error.

## 2022-10-22 NOTE — Progress Notes (Signed)
Patient belongs searched at bedside given acute change with the patient. Security was present during the search, nothing was found.

## 2022-10-22 NOTE — ED Provider Notes (Addendum)
[facility] Provider Note    Event Date/Time   First MD Initiated Contact with Patient 10/22/22 [phone]     (approximate)   History   Shortness of Breath   HPI  Franklin [name] is a 52 y.o. male who presents to the ED for evaluation of Shortness of Breath   Patient history of COPD presents to the ED with shortness of breath and cough for the past 2 or 3 days.   Physical Exam   Triage Vital Signs: ED Triage Vitals  Encounter Vitals Group     BP      Systolic BP Percentile      Diastolic BP Percentile      Pulse      Resp      Temp      Temp src      SpO2      Weight      Height      Head Circumference      Peak Flow      Pain Score      Pain Loc      Pain Education      Exclude from Growth Chart     Most recent vital signs: Vitals:   10/22/22 0459 10/22/22 0500  BP:    Pulse: 90   Resp:  (!) 22  Temp:    SpO2: 98%     General: Awake, no distress.   CV:  Good peripheral perfusion.  Resp:  Minimal tachypnea to the mid 20s, wheezing throughout. Abd:  No distention.  MSK:  No deformity noted.  Neuro:  No focal deficits appreciated. Other:     ED Results / Procedures / Treatments   Labs (all labs ordered are listed, but only abnormal results are displayed) Labs Reviewed  COMPREHENSIVE METABOLIC PANEL - Abnormal; Notable for the following components:      Result Value   Glucose, Bld 128 (*)    All other components within normal limits  CBC WITH DIFFERENTIAL/PLATELET - Abnormal; Notable for the following components:   WBC 11.9 (*)    Neutro Abs 8.5 (*)    Eosinophils Absolute 0.7 (*)    All other components within normal limits  BLOOD GAS, VENOUS - Abnormal; Notable for the following components:   Bicarbonate 30.8 (*)    Acid-Base Excess 3.5 (*)    All other components within normal limits  TROPONIN I (HIGH SENSITIVITY)  TROPONIN I (HIGH SENSITIVITY)    EKG Sinus rhythm, rate of 98bpm, normal axis and intervals. No  stemi  RADIOLOGY CXR interpreted by me without evidence of acute cardiopulmonary pathology.  Official radiology report(s): DG Chest Portable 1 View  Result Date: 10/22/2022 CLINICAL DATA:  Shortness of breath and wheezing EXAM: PORTABLE CHEST 1 VIEW COMPARISON:  09/24/2022 FINDINGS: Normal heart size and mediastinal contours. Generous lung volumes. No acute infiltrate or edema. No effusion or pneumothorax. No acute osseous findings. IMPRESSION: No active disease. Electronically Signed   By: Tiburcio Pea M.D.   On: 10/22/2022 04:42    PROCEDURES and INTERVENTIONS:  .1-3 Lead EKG Interpretation  Performed by: Delton Prairie, MD Authorized by: Delton Prairie, MD     Interpretation: normal     ECG rate:  90   ECG rate assessment: normal     Rhythm: sinus rhythm     Ectopy: none     Conduction: normal   .Critical Care  Performed by: Delton Prairie, MD Authorized by: Delton Prairie, MD   Critical  care provider statement:    Critical care time (minutes):  30   Critical care time was exclusive of:  Separately billable procedures and treating other patients   Critical care was necessary to treat or prevent imminent or life-threatening deterioration of the following conditions:  Respiratory failure   Critical care was time spent personally by me on the following activities:  Development of treatment plan with patient or surrogate, discussions with consultants, evaluation of patient's response to treatment, examination of patient, ordering and review of laboratory studies, ordering and review of radiographic studies, ordering and performing treatments and interventions, pulse oximetry, re-evaluation of patient's condition and review of old charts   Medications  methylPREDNISolone sodium succinate (SOLU-MEDROL) 125 mg/2 mL injection 125 mg (125 mg Intravenous Given 10/22/22 0403)  ipratropium-albuterol (DUONEB) 0.5-2.5 (3) MG/3ML nebulizer solution 6 mL (6 mLs Nebulization Given 10/22/22 0433)   magnesium sulfate IVPB 2 g 50 mL (0 g Intravenous Stopped 10/22/22 0433)  LORazepam (ATIVAN) injection 1 mg (1 mg Intravenous Given 10/22/22 0410)  ipratropium-albuterol (DUONEB) 0.5-2.5 (3) MG/3ML nebulizer solution 3 mL (3 mLs Nebulization Given 10/22/22 0516)     IMPRESSION / MDM / ASSESSMENT AND PLAN / ED COURSE  I reviewed the triage vital signs and the nursing notes.  Differential diagnosis includes, but is not limited to, COPD exacerbation, ACS, PTX, PNA, muscle strain/spasm, PE, dissection, anxiety, pleural effusion  {Patient presents with symptoms of an acute illness or injury that is potentially life-threatening.  52 yo male presents with evidence of a COPD exacerbation. No hypoxia, but symptomatic. Attempt BiPAP but he does not tolerate this well. Provide magnesium , steroids, nebs. Essentially normal cbc, metabolic panel and troponin.     Clinical Course as of 10/22/22 0555  Fri Oct 22, 2022  0407 Reassessed, anxious and fidgety on the BiPAP.  Does acknowledge cocaine use earlier today.  We will provide some benzos [DS]  0428 Reassessed.  Not tolerating the BiPAP despite ativan, but he is comfortably asleep on room air.  Still wheezing [DS]  0509 Reassessed, still wheezing and tachypneic.  We will consult with medicine for admission [DS]  0543 Trying to get an ABG. He is no longer lethargic, now more agitated. Wont tolerated arterial stick. Will start with a VBG [DS]    Clinical Course User Index [DS] Delton Prairie, MD     FINAL CLINICAL IMPRESSION(S) / ED DIAGNOSES   Final diagnoses:  COPD exacerbation (HCC)  Cocaine abuse (HCC)     Rx / DC Orders   ED Discharge Orders     None        Note:  This document was prepared using Dragon voice recognition software and may include unintentional dictation errors.   Delton Prairie, MD 10/22/22 4098    Delton Prairie, MD 10/22/22 [phone]

## 2022-10-22 NOTE — Progress Notes (Signed)
   10/22/22 1116  Charting Type  Charting Type Full Reassessment Changes Noted  Focused Reassessment No Changes Neurological;Respiratory;Cardiac;Psychosocial  Neurological  Neuro (WDL) X  Orientation Level Oriented X4  Cognition Appropriate at baseline;Impulsive  Speech Clear  R Pupil Size (mm) 3  R Pupil Shape Round  R Pupil Reaction Brisk  L Pupil Size (mm) 3  L Pupil Shape Round  L Pupil Reaction Brisk  Neuro Symptoms Anxiety;Agitation  Neuro Additional Assessments Tremors  Respiratory  Respiratory (WDL) X  Cough Productive  Sputum Amount None  Sputum Color Clear  Sputum Consistency Thick  Respiratory Interventions Oral suction  Respiratory Pattern Labored;Irregular;Symmetrical;Accessory muscle use  Bilateral Breath Sounds Expiratory wheezes;Inspiratory wheezes  R Upper  Breath Sounds Inspiratory wheezes;Expiratory wheezes  R Lower Breath Sounds Expiratory wheezes;Inspiratory wheezes  L Upper Breath Sounds Inspiratory wheezes;Expiratory wheezes  L Lower Breath Sounds Expiratory wheezes;Inspiratory wheezes  Chest Assessment Chest expansion symmetrical  Oral Suctioning/Secretions  Suction Type Oral  Suction Device Yankauer  Cardiac  Cardiac (WDL) X  Pulse Irregular  Heart Sounds S1, S2;S3  Psychosocial  Psychosocial (WDL) X  Patient Behaviors Anxious;Agitated;Irritable     Dr. Chipper Herb at bedside, see new orders.

## 2022-10-22 NOTE — Progress Notes (Signed)
   10/22/22 1010  CIWA-Ar  BP (!) 144/68  Pulse Rate 88  Nausea and Vomiting 2  Tactile Disturbances 0  Tremor 1  Auditory Disturbances 1  Paroxysmal Sweats 0  Visual Disturbances 0  Anxiety 7  Headache, Fullness in Head 0  Agitation 1  Orientation and Clouding of Sensorium 0  CIWA-Ar Total 12      Patient verbalizes anxiety, reports 12 beer daily intake last drink yesterday, reports use of "crak" last used yesterday.   Primary team informed.

## 2022-10-23 DIAGNOSIS — F1420 Cocaine dependence, uncomplicated: Secondary | ICD-10-CM | POA: Diagnosis not present

## 2022-10-23 DIAGNOSIS — F1020 Alcohol dependence, uncomplicated: Secondary | ICD-10-CM | POA: Diagnosis not present

## 2022-10-23 DIAGNOSIS — J441 Chronic obstructive pulmonary disease with (acute) exacerbation: Secondary | ICD-10-CM | POA: Diagnosis not present

## 2022-10-23 DIAGNOSIS — Z720 Tobacco use: Secondary | ICD-10-CM | POA: Diagnosis not present

## 2022-10-23 MED ORDER — AZITHROMYCIN 250 MG PO TABS
250.0000 mg | ORAL_TABLET | Freq: Every day | ORAL | Status: DC
  Administered 2022-10-24: 250 mg via ORAL
  Filled 2022-10-23: qty 1

## 2022-10-23 MED ORDER — AZITHROMYCIN 250 MG PO TABS
500.0000 mg | ORAL_TABLET | Freq: Every day | ORAL | Status: AC
  Administered 2022-10-23: 500 mg via ORAL
  Filled 2022-10-23: qty 2

## 2022-10-23 MED ORDER — NICOTINE 14 MG/24HR TD PT24
14.0000 mg | MEDICATED_PATCH | Freq: Every day | TRANSDERMAL | Status: DC
  Filled 2022-10-23 (×2): qty 1

## 2022-10-23 NOTE — Discharge Instructions (Addendum)
Some PCP options in [place] area- not a comprehensive list  [facility]- [phone] [facility]- [phone] [facility]- [phone] [facility]- [phone] [facility]- [phone] Lutricia Horsfall- [phone]  or [facility] Physician Referral Line [phone]  Transportation Resources  Agency Name: [facility] Agency Address: [address] [place], [place] [address] Phone: [phone] Email: [email or web address] Website: [email or web address] Service(s) Offered: Housing services, self-sufficiency, congregate meal program, weatherization program, Field seismologist program, emergency food assistance,  housing counseling, home ownership program, wheels-towork program.  Agency Name: [facility] [employer] [phone]) Address: [address], [place], [place] [address] Phone: [phone] Website: [email or web address] Service(s) Offered: Transportation for [employer], subscription and demand response; Dial-a-Ride for citizens 64 years of age or older.  Agency Name: Department of Social Services Address: [address] [place], [place] [address] Phone: [phone] Service(s) Offered: Child support services; child welfare services; food stamps; Medicaid; work first family assistance; and aid with fuel,  rent, food and medicine, transportation assistance.  Agency Name: Disabled [employer] (DAV) Transportation  Network Phone: [phone] Service(s) Offered: Transports veterans to the [facility] medical center. Call  forty-eight hours in advance and leave the name, telephone  number, date, and time of appointment. Veteran will be  contacted by the driver the day before the appointment to  arrange a pick up point   Transportation Resources  Agency Name: [facility] Agency Address: [address] [place], [place] [address] Phone: [phone] Email:  [email or web address] Website: [email or web address] Service(s) Offered: Housing services, self-sufficiency, congregate meal program, weatherization program, Field seismologist program, emergency food assistance,  housing counseling, home ownership program, wheels-towork program.  Agency Name: [facility] [employer] [phone]) Address: [address] [place], [place] [address] Phone: [phone] Website: [email or web address] Service(s) Offered: Transportation for [employer], subscription and demand response; Dial-a-Ride for citizens 25 years of age or older.  Agency Name: Department of Social Services Address: [address] [place], [place] [address] Phone: [phone] Service(s) Offered: Child support services; child welfare services; food stamps; Medicaid; work first family assistance; and aid with fuel,  rent, food and medicine, transportation assistance.  Agency Name: Disabled [employer] (DAV) Transportation  Network Phone: [phone] Service(s) Offered: Transports veterans to the [facility] medical center. Call  forty-eight hours in advance and leave the name, telephone  number, date, and time of appointment. Veteran will be  contacted by the driver the day before the appointment to  arrange a pick up point    [employer] ACTA currently provides door to door services. ACTA connects with PART daily for services to [facility]. ACTA also performs contract services to [employer] operates 27 vehicles, all but 3 mini-vans are equipped with lifts for special needs as well as the general public. ACTA drivers are each CDL certified and trained in First Aid and CPR. ACTA was established in 2002 by [employer]. An independent Industrial/product designer. ACTA operates via Cytogeneticist with required Research scientist (physical sciences) from [place]. ACTA provides over 80,000 passenger trips each year, including [facility] and [employer] sites.  Call at least by 11 AM one business day prior to needing transportation  [employer].                      [place], [place] [address]     Office Hours: Monday-Friday  8 AM - 5 PM    Food  Resources  Agency Name: [facility] Agency Address: [address] [place], [place] [address] Phone: [phone] Website: [email or web address] Service(s) Offered: Housing services, self-sufficiency, congregate meal program, weatherization program, Event organiser program, emergency food assistance,  housing counseling, home ownership program, wheels - to work program.  [employer] free for 60 and older at various locations from [employer], Monday-Friday:  [employer], [address] [place], [phone] -[facility], [address], [place] [phone]  -[facility], [address], [place] [phone]  -[address], [place], [phone]  Agency Name: [facility] Address: [phone] [address], [place], [place] [address] Phone: [phone] Website: [email or web address] Service(s) Offered: Home delivered hot, frozen, and emergency  meals. Grocery assistance program which matches  volunteers one-on-one with seniors unable to grocery shop  for themselves. Must be 60 years and older; less than 20  hours of in-home aide service, limited or no driving ability;  live alone or with someone with a disability; live in  [place].  Agency Name: [facility]ologist [facility] [employer]) Address: [address] [place], [place] [address] Phone: [phone] Service(s) Offered: Food is served to shut-ins, homeless, elderly, and low income people in the community every Saturday (11:30 am-12:30 pm) and Sunday (12:30 pm-1:30pm). Volunteers also  offer help and encouragement in seeking employment,  and spiritual guidance.  Agency Name: Department of Social Services Address: [address] [place], [place] [address] Phone: [phone] Service(s) Offered: Child support services; child welfare services; food stamps; Medicaid; work first family assistance; and aid with fuel,  rent, food and medicine.  Agency Name: Dietitian Address: [address], [place], [place] Phone: [phone] Website: [email or web address] Services Offered: Monday 10:00am-12:00, 8:00pm-9:00pm, and Friday 10:00am-12:00.  Agency Name: [employer] [facility] [place] Address: [address] [place], [place] [address] Phone: [phone] Website: [email or web address] Service(s) Offered: Serves weekday meals, open from 11:30 am- 1:00 pm., and 6:30-7:30pm, Monday-Wednesday-Friday distributes food 3:30-6pm, Monday-Wednesday-Friday.  Agency Name: [facility] Address: [address] [place], [place] Phone: [phone] Website: [email or web address] Services Offered: Distributes food the 4th Saturday of the month, starting at 8:00 am  Agency Name: [facility] Address: [phone] [address], [place], [place] [address] Phone: [phone] Website: [email or web address] Service(s) Offered: Bread of life, weekly food pantry. Open Wednesdays from 10:00am-noon.  Agency Name: [facility] [employer] Address: [address] [place], [place], [place] Phone: [phone] Services Offered: Distributes food 9am-1pm, Monday-Thursday. Call for details.  Agency Name: [facility] Address: [address], [place], [place] [address] Phone: [phone] Website: [email or web address] Service(s) Offered: Games developer. Call for assistance.  Agency Name: [facility] Address: [address], [place], [place] [address] Phone: [phone] Service  Offered: Emergency Food Pantry. Call for appointment.  Agency Name: [facility] Address: [address], [place], [place] [address] Phone: [phone] Website: [email or web address] Services Offered: Games developer. Call for details  Agency Name: [facility] Address: [address] [place], [place] Phone: [phone] Website: [email or web address] Service(s) Offered: Emergency Food Pantry. Call for details.  Agency Name: Holiday representative Address: [address] [place], [place] [address] Phone: [phone] or [phone] Website: [email or web address] Service(s) Offered: Distribute food 9am-11:30 am, Tuesday-Friday, and 1-3:30pm, Monday-Friday. Food pantry Monday-Friday 1pm-3pm, fresh items, Mon.-Wed.-Fri.  Agency Name: [facility] Address: [address] [place], [place] [address] Phone: [phone] Website: [email or web address] Services Offered: Distribute food Tues and Sats from 9:00am-noon. Closed  1st Saturday of each month. Call for details  Agency Name: [employer] [employer] Address: [facility] [address], [place] [address] Phone: [phone]  Services Offered: Delivers meals every Thursday    Intensive Outpatient Programs   [facility]  [facility] [address]  [address] [place],  [place]  [place], [place] [phone]  [facility]  [facility] (Inpatient and outpatient)    [phone] (Suboxone and Methadone) [address] [phone]  ADS: [facility] Programs - Intensive Outpatient [address]  [address] [place], [place] [address] [place], [place]  [phone]  [facility] (Outpatient, Inpatient, Chemical  Caring Services (Groups and Residental) (insurance only) [phone] [place],  [place]   [phone]   [facility] (for caregivers and family) [address]  [address] [place], [place]  [place], [place] [phone]  Residential Treatment Programs  [facility](2 years) Residential: 90 days)  [facility] ([facility]  [address] [place], [place]  [place], [place] [phone] or [phone]  [facility] [address] [place], [place]  [place], [place] [phone]  [facility] Services (RTS) [address] [place], [place] [address] [place], [place] [phone] Admissions: 8am-3pm M-F  BATS Program: Residential Program [phone] Days)           [facility]ATC: [facility]  [place], [place]  [place], [place]  [phone] or [phone] (Walk in Hours over the weekend or by referral)  [facility] [address] [place], [place] [address] [phone] (Do virtual or phone assessment, offer transportation within 25 miles, have in patient and Outpatient options)   Mobil Crisis: Therapeutic Alternatives:[phone] (for crisis response 24 hours a day)

## 2022-10-23 NOTE — TOC Progression Note (Signed)
Transition of Care [facility]) - Progression Note    Patient Details  Name: Franklin [name] MRN: [id number] Date of Birth: Dec 21, 1970  Transition of [facility]) CM/SW Contact  Bing Quarry, RN Phone Number: 10/23/2022, 4:46 PM  Clinical Narrative:  10/19: [facility] consult for SA resources, patient currently on CIWA monitoring. SA and SODH resources placed in AVS via Care Coordination. Patient shows no PCP or insurance. Htx of SB, HF, COPD.   Gabriel Cirri MSN RN CM  Transitions of Care Department [facility] [phone] Weekends Only           Expected Discharge Plan and Services                                               Social Determinants of Health (SDOH) Interventions SDOH Screenings   Food Insecurity: Food Insecurity Present (10/22/2022)  Housing: Patient Declined (10/22/2022)  Transportation Needs: Unmet Transportation Needs (10/22/2022)  Utilities: Patient Declined (10/22/2022)  Social Connections: Unknown (10/10/2022)   Received from [facility]  Tobacco Use: High Risk (10/22/2022)    Readmission Risk Interventions     No data to display

## 2022-10-23 NOTE — Assessment & Plan Note (Signed)
-

## 2022-10-23 NOTE — Progress Notes (Signed)
Progress Note   Patient: Franklin [name] ZOX:[id number] DOB: 1970-07-25 DOA: 10/22/2022     1 DOS: the patient was seen and examined on 10/23/2022   Brief hospital course: Taken from prior notes.  Franklin [name] is a 52 y.o. male with medical history significant of COPD, HTN, chronic HFrEF with LVEF 50-55%/lower normal, bipolar disorder, anxiety/depression, cocaine abuse, presented with worsening of wheezing and shortness of breath, gradually worsening with associated cough and yellow sputum production over the past 2 to 3 days.  On presentation to ED he was found to be in severe respiratory distress, initially BiPAP was ordered but patient did not tolerated.  Labs with leukocytosis at 11.9, bicarb 27, CXR with no acute infiltrate. VBG showed 7.3 4/57/37.  Patient was given IV Solu-Medrol and multiple rounds of breathing treatment.  Overnight patient developed agitation and worsening wheezing.  Repeat chest x-ray was without any acute abnormality.  It was thought to be due to alcohol withdrawal.  10/19: Vital normal, saturating 100% on 2 L.  No wheezing at the time of exam today.  Will try weaning from oxygen.    Assessment and Plan: * COPD with acute exacerbation (HCC) Acute hypoxic respiratory failure.  No baseline oxygen use, likely due to COPD exacerbation as improving.  Currently on 2 L. -Continue with prednisone -Switching doxycycline with Zithromax  -Try weaning from oxygen. -Continue with bronchodilator -Continue with flutter valve and incentive spirometry  Alcohol use disorder, severe, dependence (HCC) Patient was becoming agitated so concern of alcohol withdrawal. -Started on CIWA protocol  Cocaine use disorder, severe, dependence (HCC) Counseling was provided. Metoprolol was switched with Coreg  Tobacco abuse -Nicotine patch as needed   Subjective: Patient was resting comfortably when seen today.  Received Ativan for agitation and concern of alcohol  withdrawal.  Physical Exam: Vitals:   10/22/22 2357 10/23/22 0421 10/23/22 0824 10/23/22 1149  BP: 117/77 (!) 113/59 119/69 119/77  Pulse: 88 69 65 (!) 44  Resp: 18 18 20 20   Temp: 98.7 F (37.1 C) 97.8 F (36.6 C) 97.9 F (36.6 C) 98.6 F (37 C)  TempSrc:      SpO2: 98% 99% 100% 95%  Weight:      Height:       General.  Well-developed gentleman, in no acute distress. Pulmonary.  Lungs clear bilaterally, normal respiratory effort. CV.  Regular rate and rhythm, no JVD, rub or murmur. Abdomen.  Soft, nontender, nondistended, BS positive. CNS.  Little somnolent but easily arousable.  No focal neurologic deficit. Extremities.  No edema, no cyanosis, pulses intact and symmetrical. Psychiatry.  Judgment and insight appears normal.   Data Reviewed: Prior data reviewed  Family Communication: Discussed with patient  Disposition: Status is: Inpatient Remains inpatient appropriate because: Severity of illness  Planned Discharge Destination: Home  DVT prophylaxis.  Lovenox Time spent: 45 minutes  This record has been created using Conservation officer, historic buildings. Errors have been sought and corrected,but may not always be located. Such creation errors do not reflect on the standard of care.   Author: Arnetha Courser, MD 10/23/2022 3:01 PM  For on call review [email or web address].

## 2022-10-23 NOTE — Plan of Care (Signed)

## 2022-10-23 NOTE — Hospital Course (Addendum)
Taken from prior notes.  Franklin [name] is a 52 y.o. male with medical history significant of COPD, HTN, chronic HFrEF with LVEF 50-55%/lower normal, bipolar disorder, anxiety/depression, cocaine abuse, presented with worsening of wheezing and shortness of breath, gradually worsening with associated cough and yellow sputum production over the past 2 to 3 days.  On presentation to ED he was found to be in severe respiratory distress, initially BiPAP was ordered but patient did not tolerated.  Labs with leukocytosis at 11.9, bicarb 27, CXR with no acute infiltrate. VBG showed 7.3 4/57/37.  Patient was given IV Solu-Medrol and multiple rounds of breathing treatment.  Overnight patient developed agitation and worsening wheezing.  Repeat chest x-ray was without any acute abnormality.  It was thought to be due to alcohol withdrawal.  10/19: Vital normal, saturating 100% on 2 L.  No wheezing at the time of exam today.  Will try weaning from oxygen.  10/20: Patient remained stable on room air now.  No more wheezing and at baseline.  CIWA score low and he does not require any intervention.  Patient is being discharged on 3 more days of Zithromax and prednisone.  He will pick up his medications tomorrow morning from [facility] pharmacy, received today's dose. His home metoprolol was switched with carvedilol based on his cocaine use history. He was also given some supplements which include multivitamin, folic acid and thiamine based on his alcohol abuse history.  Patient was counseled again against smoking, alcohol and illicit drug use.  And he will need continuation of counseling by PCP.

## 2022-10-23 NOTE — Assessment & Plan Note (Signed)
Patient was becoming agitated so concern of alcohol withdrawal. -Started on CIWA protocol

## 2022-10-23 NOTE — Assessment & Plan Note (Signed)
Counseling was provided. Metoprolol was switched with Coreg

## 2022-10-23 NOTE — Assessment & Plan Note (Signed)
Acute hypoxic respiratory failure.  No baseline oxygen use, likely due to COPD exacerbation as improving.  Currently on 2 L. -Continue with prednisone -Switching doxycycline with Zithromax  -Try weaning from oxygen. -Continue with bronchodilator -Continue with flutter valve and incentive spirometry

## 2022-10-24 ENCOUNTER — Other Ambulatory Visit: Payer: Self-pay

## 2022-10-24 DIAGNOSIS — F1020 Alcohol dependence, uncomplicated: Secondary | ICD-10-CM | POA: Diagnosis not present

## 2022-10-24 DIAGNOSIS — Z720 Tobacco use: Secondary | ICD-10-CM | POA: Diagnosis not present

## 2022-10-24 DIAGNOSIS — J441 Chronic obstructive pulmonary disease with (acute) exacerbation: Secondary | ICD-10-CM | POA: Diagnosis not present

## 2022-10-24 DIAGNOSIS — F1410 Cocaine abuse, uncomplicated: Secondary | ICD-10-CM | POA: Diagnosis not present

## 2022-10-24 MED ORDER — VITAMIN B-1 100 MG PO TABS
100.0000 mg | ORAL_TABLET | Freq: Every day | ORAL | 0 refills | Status: DC
  Filled 2022-10-24: qty 90, 90d supply, fill #0

## 2022-10-24 MED ORDER — GUAIFENESIN ER 600 MG PO TB12
1200.0000 mg | ORAL_TABLET | Freq: Two times a day (BID) | ORAL | 0 refills | Status: DC | PRN
  Filled 2022-10-24: qty 20, 5d supply, fill #0

## 2022-10-24 MED ORDER — PREDNISONE 20 MG PO TABS
40.0000 mg | ORAL_TABLET | Freq: Every day | ORAL | 0 refills | Status: DC
  Filled 2022-10-24: qty 6, 3d supply, fill #0

## 2022-10-24 MED ORDER — FOLIC ACID 1 MG PO TABS
1.0000 mg | ORAL_TABLET | Freq: Every day | ORAL | 0 refills | Status: DC
  Filled 2022-10-24: qty 90, 90d supply, fill #0

## 2022-10-24 MED ORDER — CARVEDILOL 3.125 MG PO TABS
3.1250 mg | ORAL_TABLET | Freq: Two times a day (BID) | ORAL | 1 refills | Status: DC
  Filled 2022-10-24: qty 60, 30d supply, fill #0

## 2022-10-24 MED ORDER — AZITHROMYCIN 250 MG PO TABS
ORAL_TABLET | ORAL | 0 refills | Status: DC
  Filled 2022-10-24: qty 3, 3d supply, fill #0

## 2022-10-24 MED ORDER — ADULT MULTIVITAMIN W/MINERALS CH
1.0000 | ORAL_TABLET | Freq: Every day | ORAL | 0 refills | Status: DC
  Filled 2022-10-24: qty 90, 90d supply, fill #0

## 2022-10-24 MED ORDER — BUDESONIDE 0.5 MG/2ML IN SUSP
0.5000 mg | Freq: Two times a day (BID) | RESPIRATORY_TRACT | Status: DC
  Administered 2022-10-24: 0.5 mg via RESPIRATORY_TRACT
  Filled 2022-10-24: qty 2

## 2022-10-24 NOTE — Progress Notes (Signed)
Patient is ready for discharge. I have reviewed discharge instructions with patient. I have answered questions regarding discharge. Patient's IV has been removed without complication. He has been removed from telemetry. Patient will pick up medication from [facility] tomorrow morning. He has refused to be transported downstairs by wheelchair to meet his sister at the front of the hospital to transport him home. He said that he wants to walk himself downstairs to the front of the hospital where he will meet his sister.

## 2022-10-24 NOTE — Discharge Summary (Signed)
Physician Discharge Summary   Patient: Franklin [name] MRN: [id number] DOB: 01-Sep-1970  Admit date:     10/22/2022  Discharge date: 10/24/22  Discharge Physician: Arnetha Courser   PCP: Patient, No Pcp Per   Recommendations at discharge:  Please obtain CBC and BMP on follow-up Please ensure completion of 3 more days of prednisone and Zithromax Follow-up with primary care provider within a week  Discharge Diagnoses: Principal Problem:   COPD with acute exacerbation (HCC) Active Problems:   Alcohol use disorder, severe, dependence (HCC)   Cocaine use disorder, severe, dependence (HCC)   Tobacco abuse   Cocaine abuse [facility])   Hospital Course: Taken from prior notes.  Franklin [name] is a 52 y.o. male with medical history significant of COPD, HTN, chronic HFrEF with LVEF 50-55%/lower normal, bipolar disorder, anxiety/depression, cocaine abuse, presented with worsening of wheezing and shortness of breath, gradually worsening with associated cough and yellow sputum production over the past 2 to 3 days.  On presentation to ED he was found to be in severe respiratory distress, initially BiPAP was ordered but patient did not tolerated.  Labs with leukocytosis at 11.9, bicarb 27, CXR with no acute infiltrate. VBG showed 7.3 4/57/37.  Patient was given IV Solu-Medrol and multiple rounds of breathing treatment.  Overnight patient developed agitation and worsening wheezing.  Repeat chest x-ray was without any acute abnormality.  It was thought to be due to alcohol withdrawal.  10/19: Vital normal, saturating 100% on 2 L.  No wheezing at the time of exam today.  Will try weaning from oxygen.  10/20: Patient remained stable on room air now.  No more wheezing and at baseline.  CIWA score low and he does not require any intervention.  Patient is being discharged on 3 more days of Zithromax and prednisone.  He will pick up his medications tomorrow morning from [facility] outpatient pharmacy, received  today's dose. His home metoprolol was switched with carvedilol based on his cocaine use history. He was also given some supplements which include multivitamin, folic acid and thiamine based on his alcohol abuse history.  Patient was counseled again against smoking, alcohol and illicit drug use.  And he will need continuation of counseling by PCP.  Assessment and Plan: * COPD with acute exacerbation (HCC) Acute hypoxic respiratory failure.  No baseline oxygen use, likely due to COPD exacerbation as improving.  Able to wean back to room air -Continue with prednisone -Continue with Zithromax  -Continue with bronchodilator -Continue with flutter valve and incentive spirometry  Alcohol use disorder, severe, dependence (HCC) Patient was becoming agitated so concern of alcohol withdrawal. -Started on CIWA protocol  Cocaine use disorder, severe, dependence (HCC) Counseling was provided. Metoprolol was switched with Coreg  Tobacco abuse -Nicotine patch as needed   Consultants: None Procedures performed: None Disposition: Home Diet recommendation:  Discharge Diet Orders (From admission, onward)     Start     Ordered   10/24/22 0000  Diet - low sodium heart healthy        10/24/22 1144           Regular diet DISCHARGE MEDICATION: Allergies as of 10/24/2022   No Known Allergies      Medication List     STOP taking these medications    metoprolol tartrate 25 MG tablet Commonly known as: LOPRESSOR       TAKE these medications    albuterol 108 (90 Base) MCG/ACT inhaler Commonly known as: VENTOLIN HFA Inhale 2 puffs into the lungs  every 6 (six) hours as needed for wheezing or shortness of breath. What changed: Another medication with the same name was removed. Continue taking this medication, and follow the directions you see here.   azithromycin 250 MG tablet Commonly known as: ZITHROMAX Take 1 tablet daily for the next 3 days Start taking on: October 25, 2022    carvedilol 3.125 MG tablet Commonly known as: COREG Take 1 tablet (3.125 mg total) by mouth 2 (two) times daily with a meal.   fluticasone-salmeterol 250-50 MCG/ACT Aepb Commonly known as: Advair Diskus Inhale 1 puff into the lungs in the morning and at bedtime.   folic acid 1 MG tablet Commonly known as: FOLVITE Take 1 tablet (1 mg total) by mouth daily. Start taking on: October 25, 2022   guaiFENesin 600 MG 12 hr tablet Commonly known as: MUCINEX Take 2 tablets (1,200 mg total) by mouth 2 (two) times daily as needed for cough or to loosen phlegm.   multivitamin with minerals Tabs tablet Take 1 tablet by mouth daily.   predniSONE 20 MG tablet Commonly known as: DELTASONE Take 2 tablets (40 mg total) by mouth daily with breakfast for 3 days. Start taking on: October 25, 2022   thiamine 100 MG tablet Commonly known as: Vitamin B-1 Take 1 tablet (100 mg total) by mouth daily. Start taking on: October 25, 2022        Discharge Exam: Ceasar Mons Weights   10/22/22 0809  Weight: 68.9 kg   General.  Well-developed gentleman, in no acute distress. Pulmonary.  Lungs clear bilaterally, normal respiratory effort. CV.  Regular rate and rhythm, no JVD, rub or murmur. Abdomen.  Soft, nontender, nondistended, BS positive. CNS.  Alert and oriented .  No focal neurologic deficit. Extremities.  No edema, no cyanosis, pulses intact and symmetrical. Psychiatry.  Judgment and insight appears normal.   Condition at discharge: stable  The results of significant diagnostics from this hospitalization (including imaging, microbiology, ancillary and laboratory) are listed below for reference.   Imaging Studies: DG Chest 1 View  Result Date: 10/22/2022 CLINICAL DATA:  COPD wheezing short of breath EXAM: CHEST  1 VIEW COMPARISON:  10/22/2022, CT 07/01/2022 FINDINGS: The heart size and mediastinal contours are within normal limits. Both lungs are clear. The visualized skeletal structures are  unremarkable. IMPRESSION: No active disease. Electronically Signed   By: Jasmine Pang M.D.   On: 10/22/2022 15:56   DG Chest Portable 1 View  Result Date: 10/22/2022 CLINICAL DATA:  Shortness of breath and wheezing EXAM: PORTABLE CHEST 1 VIEW COMPARISON:  09/24/2022 FINDINGS: Normal heart size and mediastinal contours. Generous lung volumes. No acute infiltrate or edema. No effusion or pneumothorax. No acute osseous findings. IMPRESSION: No active disease. Electronically Signed   By: Tiburcio Pea M.D.   On: 10/22/2022 04:42    Microbiology: Results for orders placed or performed during the hospital encounter of 09/24/22  Resp panel by RT-PCR (RSV, Flu A&B, Covid) Anterior Nasal Swab     Status: None   Collection Time: 09/24/22  7:02 AM   Specimen: Anterior Nasal Swab  Result Value Ref Range Status   SARS Coronavirus 2 by RT PCR NEGATIVE NEGATIVE Final   Influenza A by PCR NEGATIVE NEGATIVE Final   Influenza B by PCR NEGATIVE NEGATIVE Final    Comment: (NOTE) The Xpert Xpress SARS-CoV-2/FLU/RSV plus assay is intended as an aid in the diagnosis of influenza from Nasopharyngeal swab specimens and should not be used as a sole basis for treatment.  Nasal washings and aspirates are unacceptable for Xpert Xpress SARS-CoV-2/FLU/RSV testing.  Fact Sheet for Patients: BloggerCourse.com  Fact Sheet for Healthcare Providers: SeriousBroker.it  This test is not yet approved or cleared by the [place] FDA and has been authorized for detection and/or diagnosis of SARS-CoV-2 by FDA under an Emergency Use Authorization (EUA). This EUA will remain in effect (meaning this test can be used) for the duration of the COVID-19 declaration under Section 564(b)(1) of the Act, 21 U.S.C. section 360bbb-3(b)(1), unless the authorization is terminated or revoked.     Resp Syncytial Virus by PCR NEGATIVE NEGATIVE Final    Comment: (NOTE) Fact Sheet for  Patients: BloggerCourse.com  Fact Sheet for Healthcare Providers: SeriousBroker.it  This test is not yet approved or cleared by the [place] FDA and has been authorized for detection and/or diagnosis of SARS-CoV-2 by FDA under an Emergency Use Authorization (EUA). This EUA will remain in effect (meaning this test can be used) for the duration of the COVID-19 declaration under Section 564(b)(1) of the Act, 21 U.S.C. section 360bbb-3(b)(1), unless the authorization is terminated or revoked.  Performed at [facility], [address], [place], [place] [address]     Labs: CBC: Recent Labs  Lab 10/22/22 0356  WBC 11.9*  NEUTROABS 8.5*  HGB 14.4  HCT 44.3  MCV 91.3  PLT 279   Basic Metabolic Panel: Recent Labs  Lab 10/22/22 0356  NA 139  K 3.5  CL 104  CO2 27  GLUCOSE 128*  BUN 14  CREATININE 0.76  CALCIUM 8.9   Liver Function Tests: Recent Labs  Lab 10/22/22 0356  AST 22  ALT 19  ALKPHOS 69  BILITOT 1.1  PROT 7.8  ALBUMIN 4.4   CBG: Recent Labs  Lab 10/22/22 1015  GLUCAP 198*    Discharge time spent: greater than 30 minutes.  This record has been created using Conservation officer, historic buildings. Errors have been sought and corrected,but may not always be located. Such creation errors do not reflect on the standard of care.   Signed: Arnetha Courser, MD [facility] 10/24/2022

## 2022-10-24 NOTE — TOC Transition Note (Signed)
Transition of Care [facility]) - CM/SW Discharge Note   Patient Details  Name: Franklin [name] MRN: [id number] Date of Birth: 06/16/1970  Transition of [facility] CM/SW Contact:  Bing Quarry, RN Phone Number: 10/24/2022, 11:52 AM   Clinical Narrative: 10/20: DC orders in place. PCP options list for patient added to AVS in addition to SDOH and SA resources added on 10/23/22.   Patient DC to home self care.   Gabriel Cirri MSN RN CM  Transitions of Care Department [facility] [phone] Weekends Only     Final next level of care: Home/Self Care     Patient Goals and CMS Choice      Discharge Placement                         Discharge Plan and Services Additional resources added to the After Visit Summary for                                       Social Determinants of Health (SDOH) Interventions SDOH Screenings   Food Insecurity: Food Insecurity Present (10/22/2022)  Housing: Patient Declined (10/22/2022)  Transportation Needs: Unmet Transportation Needs (10/22/2022)  Utilities: Patient Declined (10/22/2022)  Social Connections: Unknown (10/10/2022)   Received from [facility]  Tobacco Use: High Risk (10/22/2022)     Readmission Risk Interventions     No data to display

## 2022-10-24 NOTE — Plan of Care (Signed)

## 2022-10-25 ENCOUNTER — Other Ambulatory Visit: Payer: Self-pay

## 2022-10-26 ENCOUNTER — Emergency Department
Admission: EM | Admit: 2022-10-26 | Discharge: 2022-10-27 | Disposition: A | Discharge status: Home / Self Care | Payer: MEDICAID | Attending: Emergency Medicine | Admitting: Emergency Medicine

## 2022-10-26 ENCOUNTER — Encounter: Payer: Self-pay | Admitting: Emergency Medicine

## 2022-10-26 ENCOUNTER — Other Ambulatory Visit: Payer: Self-pay

## 2022-10-26 ENCOUNTER — Emergency Department: Payer: MEDICAID

## 2022-10-26 DIAGNOSIS — R079 Chest pain, unspecified: Secondary | ICD-10-CM | POA: Diagnosis not present

## 2022-10-26 DIAGNOSIS — J441 Chronic obstructive pulmonary disease with (acute) exacerbation: Secondary | ICD-10-CM

## 2022-10-26 DIAGNOSIS — F3130 Bipolar disorder, current episode depressed, mild or moderate severity, unspecified: Secondary | ICD-10-CM | POA: Diagnosis present

## 2022-10-26 DIAGNOSIS — Z1152 Encounter for screening for COVID-19: Secondary | ICD-10-CM | POA: Insufficient documentation

## 2022-10-26 DIAGNOSIS — R45851 Suicidal ideations: Secondary | ICD-10-CM

## 2022-10-26 DIAGNOSIS — J449 Chronic obstructive pulmonary disease, unspecified: Secondary | ICD-10-CM | POA: Diagnosis present

## 2022-10-26 DIAGNOSIS — F17210 Nicotine dependence, cigarettes, uncomplicated: Secondary | ICD-10-CM | POA: Insufficient documentation

## 2022-10-26 DIAGNOSIS — F1994 Other psychoactive substance use, unspecified with psychoactive substance-induced mood disorder: Secondary | ICD-10-CM | POA: Diagnosis present

## 2022-10-26 DIAGNOSIS — F1910 Other psychoactive substance abuse, uncomplicated: Secondary | ICD-10-CM | POA: Diagnosis present

## 2022-10-26 DIAGNOSIS — F332 Major depressive disorder, recurrent severe without psychotic features: Secondary | ICD-10-CM | POA: Diagnosis present

## 2022-10-26 DIAGNOSIS — F1914 Other psychoactive substance abuse with psychoactive substance-induced mood disorder: Secondary | ICD-10-CM | POA: Diagnosis not present

## 2022-10-26 LAB — BASIC METABOLIC PANEL
Anion gap: 11 (ref 5–15)
BUN: 15 mg/dL (ref 6–20)
CO2: 23 mmol/L (ref 22–32)
Calcium: 9.2 mg/dL (ref 8.9–10.3)
Chloride: 101 mmol/L (ref 98–111)
Creatinine, Ser: 0.89 mg/dL (ref 0.61–1.24)
GFR, Estimated: >60 mL/min (ref >60)
Glucose, Bld: 93 mg/dL (ref 70–99)
Potassium: 4.1 mmol/L (ref 3.5–5.1)
Sodium: 135 mmol/L (ref 135–145)

## 2022-10-26 LAB — URINE DRUG SCREEN, QUALITATIVE (ARMC ONLY)
Amphetamines, Ur Screen: NOT DETECTED
Barbiturates, Ur Screen: NOT DETECTED
Benzodiazepine, Ur Scrn: POSITIVE — AB
Cannabinoid 50 Ng, Ur ~~LOC~~: NOT DETECTED
Cocaine Metabolite,Ur ~~LOC~~: POSITIVE — AB
MDMA (Ecstasy)Ur Screen: NOT DETECTED
Methadone Scn, Ur: NOT DETECTED
Opiate, Ur Screen: NOT DETECTED
Phencyclidine (PCP) Ur S: NOT DETECTED
Tricyclic, Ur Screen: NOT DETECTED

## 2022-10-26 LAB — CBC
HCT: 44.1 % (ref 39.0–52.0)
Hemoglobin: 14.7 g/dL (ref 13.0–17.0)
MCH: 29.5 pg (ref 26.0–34.0)
MCHC: 33.3 g/dL (ref 30.0–36.0)
MCV: 88.6 fL (ref 80.0–100.0)
Platelets: 378 10*3/uL (ref 150–400)
RBC: 4.98 MIL/uL (ref 4.22–5.81)
RDW: 14.3 % (ref 11.5–15.5)
WBC: 9.6 10*3/uL (ref 4.0–10.5)
nRBC: 0 % (ref 0.0–0.2)

## 2022-10-26 LAB — RESP PANEL BY RT-PCR (RSV, FLU A&B, COVID)  RVPGX2
Influenza A by PCR: NEGATIVE
Influenza B by PCR: NEGATIVE
Resp Syncytial Virus by PCR: NEGATIVE
SARS Coronavirus 2 by RT PCR: NEGATIVE

## 2022-10-26 LAB — TROPONIN I (HIGH SENSITIVITY)
Troponin I (High Sensitivity): 4 ng/L (ref <18)
Troponin I (High Sensitivity): 5 ng/L (ref <18)

## 2022-10-26 LAB — ETHANOL: Alcohol, Ethyl (B): <10 mg/dL (ref <10)

## 2022-10-26 LAB — SALICYLATE LEVEL: Salicylate Lvl: <7 mg/dL — ABNORMAL LOW (ref 7.0–30.0)

## 2022-10-26 LAB — ACETAMINOPHEN LEVEL: Acetaminophen (Tylenol), Serum: <10 ug/mL — ABNORMAL LOW (ref 10–30)

## 2022-10-26 MED ORDER — PREDNISONE 20 MG PO TABS
60.0000 mg | ORAL_TABLET | Freq: Every day | ORAL | Status: DC
  Administered 2022-10-27: 60 mg via ORAL
  Filled 2022-10-26: qty 3

## 2022-10-26 MED ORDER — OLANZAPINE 10 MG PO TABS: 10.0000 mg | ORAL_TABLET | Freq: Two times a day (BID) | ORAL | Status: DC | PRN

## 2022-10-26 MED ORDER — ALBUTEROL SULFATE HFA 108 (90 BASE) MCG/ACT IN AERS
2.0000 | INHALATION_SPRAY | Freq: Four times a day (QID) | RESPIRATORY_TRACT | Status: DC | PRN
  Administered 2022-10-26: 2 via RESPIRATORY_TRACT
  Filled 2022-10-26: qty 6.7

## 2022-10-26 MED ORDER — OLANZAPINE 10 MG IM SOLR: 10.0000 mg | Freq: Two times a day (BID) | INTRAMUSCULAR | Status: DC | PRN

## 2022-10-26 MED ORDER — IPRATROPIUM-ALBUTEROL 0.5-2.5 (3) MG/3ML IN SOLN: 3.0000 mL | Freq: Once | RESPIRATORY_TRACT | Status: DC

## 2022-10-26 MED ORDER — FLUTICASONE FUROATE-VILANTEROL 200-25 MCG/ACT IN AEPB
1.0000 | INHALATION_SPRAY | Freq: Every day | RESPIRATORY_TRACT | Status: DC
  Administered 2022-10-26 – 2022-10-27 (×2): 1 via RESPIRATORY_TRACT
  Filled 2022-10-26: qty 28

## 2022-10-26 MED ORDER — PREDNISONE 20 MG PO TABS
60.0000 mg | ORAL_TABLET | Freq: Once | ORAL | Status: AC
  Administered 2022-10-26: 60 mg via ORAL
  Filled 2022-10-26: qty 3

## 2022-10-26 MED ORDER — THIAMINE MONONITRATE 100 MG PO TABS
100.0000 mg | ORAL_TABLET | Freq: Every day | ORAL | Status: DC
  Administered 2022-10-26 – 2022-10-27 (×2): 100 mg via ORAL
  Filled 2022-10-26 (×2): qty 1

## 2022-10-26 MED ORDER — IPRATROPIUM-ALBUTEROL 0.5-2.5 (3) MG/3ML IN SOLN
3.0000 mL | Freq: Once | RESPIRATORY_TRACT | Status: AC
  Administered 2022-10-26: 3 mL via RESPIRATORY_TRACT
  Filled 2022-10-26: qty 3

## 2022-10-26 MED ORDER — HYDROXYZINE HCL 25 MG PO TABS
25.0000 mg | ORAL_TABLET | Freq: Three times a day (TID) | ORAL | Status: DC | PRN
  Administered 2022-10-26: 25 mg via ORAL
  Filled 2022-10-26: qty 1

## 2022-10-26 MED ORDER — FOLIC ACID 1 MG PO TABS
1.0000 mg | ORAL_TABLET | Freq: Every day | ORAL | Status: DC
  Administered 2022-10-26 – 2022-10-27 (×2): 1 mg via ORAL
  Filled 2022-10-26 (×2): qty 1

## 2022-10-26 MED ORDER — CARVEDILOL 6.25 MG PO TABS
3.1250 mg | ORAL_TABLET | Freq: Two times a day (BID) | ORAL | Status: DC
  Administered 2022-10-26 – 2022-10-27 (×4): 3.125 mg via ORAL
  Filled 2022-10-26 (×4): qty 1

## 2022-10-26 NOTE — ED Notes (Signed)
Pt provided with shower supplies. Pt out of shower with clean scrubs and clean socks. Supplies disposed of appropriately.

## 2022-10-26 NOTE — ED Notes (Addendum)
Pt returns to triage for re-eval after EDT reports pt mentioned SI; pt expresses SI with plan to "jump off a bridge"; st has been off anti-depressants; acuity level changed and add'l protocols added

## 2022-10-26 NOTE — ED Notes (Signed)
Pt speaking with Psych NP in interview room.

## 2022-10-26 NOTE — ED Notes (Signed)
Pt reports after taking breo-elipta inhaler- became very nervous and jittery. Reports his inhaler at home usually does not make him feel like this. Vitals rechecked and entered into chart. Patient reports talking about feelings are already improving symptoms, just made him nervous initially. Patient asked for meal tray to try to improve symptoms- given to patient at this time. Patient reports improvement in symptoms. Encouraged to notify staff if anything changes. Respirations even and unlabored at this time. Pt calm and cooperative with staff

## 2022-10-26 NOTE — ED Provider Notes (Signed)
[facility] Provider Note    Event Date/Time   First MD Initiated Contact with Patient 10/26/22 [phone]     (approximate)   History   Chest Pain   HPI  Franklin [name] is a 52 y.o. male with history of schizophrenia, COPD, bipolar disorder, substance abuse who presents to the emergency department with complaints of chest pain, cough, shortness of breath, wheezing but also having suicidal thoughts.  States he plans to jump off a bridge.  Denies any fever.  No lower extremity swelling or pain.  No history of PE or DVT.   History provided by patient.    Past Medical History:  Diagnosis Date   Anxiety    Bipolar 1 disorder (HCC)    COPD (chronic obstructive pulmonary disease) (HCC)    Depression    Schizophrenia (HCC)    Substance abuse (HCC)     Past Surgical History:  Procedure Laterality Date   NO PAST SURGERIES      MEDICATIONS:  Prior to Admission medications   Medication Sig Start Date End Date Taking? Authorizing Provider  albuterol (VENTOLIN HFA) 108 (90 Base) MCG/ACT inhaler Inhale 2 puffs into the lungs every 6 (six) hours as needed for wheezing or shortness of breath. 07/12/22   Mayers, Cari S, PA-C  azithromycin (ZITHROMAX) 250 MG tablet Take 1 tablet daily for the next 3 days 10/25/22   Arnetha Courser, MD  carvedilol (COREG) 3.125 MG tablet Take 1 tablet (3.125 mg total) by mouth 2 (two) times daily with a meal. 10/24/22   Arnetha Courser, MD  fluticasone-salmeterol (ADVAIR DISKUS) 250-50 MCG/ACT AEPB Inhale 1 puff into the lungs in the morning and at bedtime. 07/12/22   Mayers, Cari S, PA-C  folic acid (FOLVITE) 1 MG tablet Take 1 tablet (1 mg total) by mouth daily. 10/25/22   Arnetha Courser, MD  guaiFENesin (MUCINEX) 600 MG 12 hr tablet Take 2 tablets (1,200 mg total) by mouth 2 (two) times daily as needed for cough or to loosen phlegm. 10/24/22   Arnetha Courser, MD  Multiple Vitamin (MULTIVITAMIN WITH MINERALS) TABS tablet Take 1 tablet by mouth  daily. 10/24/22   Arnetha Courser, MD  predniSONE (DELTASONE) 20 MG tablet Take 2 tablets (40 mg total) by mouth daily with breakfast for 3 days. 10/25/22 10/28/22  Arnetha Courser, MD  thiamine (VITAMIN B-1) 100 MG tablet Take 1 tablet (100 mg total) by mouth daily. 10/25/22   Arnetha Courser, MD    Physical Exam   Triage Vital Signs: ED Triage Vitals  Encounter Vitals Group     BP 10/26/22 0224 134/78     Systolic BP Percentile --      Diastolic BP Percentile --      Pulse Rate 10/26/22 0224 (!) 58     Resp 10/26/22 0224 18     Temp 10/26/22 0224 98.5 F (36.9 C)     Temp Source 10/26/22 0224 Oral     SpO2 10/26/22 0220 95 %     Weight 10/26/22 0222 155 lb (70.3 kg)     Height 10/26/22 0222 5\' 10"  (1.778 m)     Head Circumference --      Peak Flow --      Pain Score 10/26/22 0222 8     Pain Loc --      Pain Education --      Exclude from Growth Chart --     Most recent vital signs: Vitals:   10/26/22 0220 10/26/22 0224  BP:  134/78  Pulse:  (!) 58  Resp:  18  Temp:  98.5 F (36.9 C)  SpO2: 95% 95%    CONSTITUTIONAL: Alert, responds appropriately to questions.  Tearful HEAD: Normocephalic, atraumatic EYES: Conjunctivae clear, pupils appear equal, sclera nonicteric ENT: normal nose; moist mucous membranes NECK: Supple, normal ROM CARD: RRR; S1 and S2 appreciated RESP: Normal chest excursion without splinting or tachypnea; scattered expiratory wheezes no rhonchi, no rales, no hypoxia or respiratory distress, speaking full sentences ABD/GI: Non-distended; soft, non-tender, no rebound, no guarding, no peritoneal signs BACK: The back appears normal EXT: Normal ROM in all joints; no deformity noted, no edema, no calf tenderness or calf swelling SKIN: Normal color for age and race; warm; no rash on exposed skin NEURO: Moves all extremities equally, normal speech PSYCH: Patient is tearful, reports suicidal ideation   ED Results / Procedures / Treatments   LABS: (all labs  ordered are listed, but only abnormal results are displayed) Labs Reviewed  ACETAMINOPHEN LEVEL - Abnormal; Notable for the following components:      Result Value   Acetaminophen (Tylenol), Serum <10 (*)    All other components within normal limits  SALICYLATE LEVEL - Abnormal; Notable for the following components:   Salicylate Lvl <7.0 (*)    All other components within normal limits  RESP PANEL BY RT-PCR (RSV, FLU A&B, COVID)  RVPGX2  CBC  BASIC METABOLIC PANEL  ETHANOL  URINE DRUG SCREEN, QUALITATIVE ([facility] ONLY)  TROPONIN I (HIGH SENSITIVITY)  TROPONIN I (HIGH SENSITIVITY)     EKG:  EKG Interpretation Date/Time:  Tuesday October 26 2022 02:44:18 EDT Ventricular Rate:  110 PR Interval:  128 QRS Duration:  86 QT Interval:  340 QTC Calculation: 460 R Axis:   59  Text Interpretation: Sinus tachycardia with Premature supraventricular complexes and Fusion complexes Right atrial enlargement Borderline ECG When compared with ECG of 24-Sep-2022 06:07, PREVIOUS ECG IS PRESENT Confirmed by Rochele Raring [phone]) on 10/26/2022 3:11:57 AM         RADIOLOGY: My personal review and interpretation of imaging: X-ray clear.  I have personally reviewed all radiology reports.   DG Chest 2 View  Result Date: 10/26/2022 CLINICAL DATA:  52 year old male with chest pain, shortness of breath, cough. Left side pain radiating into the arm. COPD. EXAM: CHEST - 2 VIEW COMPARISON:  Portable chest 10/22/2022 and earlier. FINDINGS: PA and lateral views 0257 hours. Mediastinal contours remain normal. Stable mildly hyperinflated lung volumes. Coarse bilateral interstitial markings have not significantly changed. No pneumothorax, pulmonary edema, pleural effusion or confluent opacity. No acute osseous abnormality identified. Negative visible bowel gas. IMPRESSION: Stable chronic lung disease.  No acute cardiopulmonary abnormality. Electronically Signed   By: Odessa Fleming M.D.   On: 10/26/2022 05:50      PROCEDURES:  Critical Care performed: No      Procedures    IMPRESSION / MDM / ASSESSMENT AND PLAN / ED COURSE  I reviewed the triage vital signs and the nursing notes.    Patient here with chest pain, wheezing and also suicidal thoughts.  The patient is on the cardiac monitor to evaluate for evidence of arrhythmia and/or significant heart rate changes.   DIFFERENTIAL DIAGNOSIS (includes but not limited to):   Mild COPD exacerbation, viral URI, pneumonia, less likely ACS, CHF, dissection, PE.  SI due to depression, anxiety, substance abuse.   Patient's presentation is most consistent with acute presentation with potential threat to life or bodily function.   PLAN: Will  obtain cardiac labs, COVID and flu swab, chest x-ray.  Will give breathing treatments, prednisone.  Will consult psychiatry and TTS.   MEDICATIONS GIVEN IN ED: Medications  albuterol (VENTOLIN HFA) 108 (90 Base) MCG/ACT inhaler 2 puff (has no administration in time range)  carvedilol (COREG) tablet 3.125 mg (has no administration in time range)  fluticasone furoate-vilanterol (BREO ELLIPTA) 200-25 MCG/ACT 1 puff (has no administration in time range)  folic acid (FOLVITE) tablet 1 mg (has no administration in time range)  thiamine (VITAMIN B1) tablet 100 mg (has no administration in time range)  predniSONE (DELTASONE) tablet 60 mg (has no administration in time range)  ipratropium-albuterol (DUONEB) 0.5-2.5 (3) MG/3ML nebulizer solution 3 mL (has no administration in time range)  ipratropium-albuterol (DUONEB) 0.5-2.5 (3) MG/3ML nebulizer solution 3 mL (3 mLs Nebulization Given 10/26/22 0323)  predniSONE (DELTASONE) tablet 60 mg (60 mg Oral Given 10/26/22 0323)     ED COURSE: Patient's labs show no leukocytosis, normal hemoglobin.  Normal electrolytes.  First troponin negative.  Second pending.  Negative Tylenol, salicylate and ethanol level.  EKG nonischemic.   Chest x-ray reviewed and  interpreted by myself and the radiologist and is clear.  Second troponin is negative.  Patient is medically cleared.  CONSULTS: TTS and psychiatry have seen patient and recommend inpatient treatment.  He is here voluntarily at this time.   OUTSIDE RECORDS REVIEWED: Reviewed last family medicine note in 2016.       FINAL CLINICAL IMPRESSION(S) / ED DIAGNOSES   Final diagnoses:  COPD exacerbation (HCC)  Suicidal ideation  Nonspecific chest pain     Rx / DC Orders   ED Discharge Orders     None        Note:  This document was prepared using Dragon voice recognition software and may include unintentional dictation errors.   Malan Werk, Layla Maw, DO 10/26/22 [phone]

## 2022-10-26 NOTE — ED Notes (Signed)
Red short sleeve shirt, blue short sleeve, black jacket, jeans, black cap, brown belt, white socks, black shoes removed and placed in labeled pt belonging bag to be secured on nursing unit; pt changed into behav scrubs

## 2022-10-26 NOTE — ED Notes (Signed)
VOL/pending psych inpatient admission

## 2022-10-26 NOTE — ED Notes (Signed)
Pt asking for additional food, states that he is on steroids and it makes him have an increased appetite, no sandwiches in the refrigerator at this time, pt told that he will be going downstairs soon and they may have sandwich trays there

## 2022-10-26 NOTE — ED Notes (Signed)
Pt taken to interview room to administer duo neb.

## 2022-10-26 NOTE — ED Notes (Signed)
Patient states feels jittery, nervous and sweaty due to his anxiety. PRN medication administered as ordered at this time.

## 2022-10-26 NOTE — ED Notes (Signed)
100% of meal consumed. Tray disposed of appropriately.

## 2022-10-26 NOTE — ED Notes (Signed)
Pt provided snack.

## 2022-10-26 NOTE — Consult Note (Cosign Needed Addendum)
Telepsych Consultation   Reason for Consult:  Psych Evaluation Referring Physician:  Dr. Elesa Massed  Location of Patient: [facility] Location of Provider: Behavioral Health TTS Department  Patient Identification: Franklin [name] MRN:  [id number] Principal Diagnosis: Major depressive disorder, recurrent, severe without psychotic features (HCC) Diagnosis:  Principal Problem:   Major depressive disorder, recurrent, severe without psychotic features (HCC) Active Problems:   Bipolar I disorder, most recent episode depressed (HCC)   Polysubstance abuse (HCC)   Suicidal ideation   Substance induced mood disorder (HCC)   COPD (chronic obstructive pulmonary disease) (HCC)   Total Time spent with patient: 30 minutes This service was provided via telemedicine using a 2-way, interactive audio and video technology. Subjective:  "COPD heart problem, tired of living"   HPI:  Franklin [name], 51 y.o., male patient seen via tele health by this provider; chart reviewed and consulted with Dr. Elesa Massed on 10/26/22.  On evaluation Franklin [name] reports that his depression and thoughts of killing himself has been getting "worst and worst" for the past few weeks.  He admits to  past SA about 5 years ago where he tried to hang himself.  Currently, he says he sleeps for extended periods of time.  He is unfortunately homeless and has been  for the past 2 months. He states that his  mom passed away and sold the house to his sister, he sister then out him out. He admits smoking crack "al the time". He says he doesn't want to feel anything. He admits to prior psych hospitals.  Last admission was about 5years ago.  He also reports past substance abuse treatment.  He says he was sober about 10 years ago for about 8 years. However, he says his was killed 4 years ago and has "been getting high and states he  hasn't been "right since".     During evaluation Franklin [name] is sitting in the assessment chair; he is alert/oriented x 4;  somber/depressed/calm/cooperative; and mood congruent with affect.  Patient is speaking in a clear tone at moderate volume, and normal pace; with good eye contact.  His thought process is coherent and relevant; There is no indication that he is currently responding to internal/external stimuli or experiencing delusional thought content.  As it pertains to AVH, patient states that he cannot tell if he hears voices or if its  in his.  Patient endorses suicidal/self-harm with a plan to jump off of a bridge.  Currently feeling hopeless, worthless,lonely and sad.  He denies homicidal ideation, psychosis, and paranoia.  Patient has remained calm throughout assessment and has answered questions appropriately.      Recommendation:  Psychiatric inpatient hospitalization   Dr. Elesa Massed; informed of above recommendation and disposition    Past Psychiatric History: MDD, Polysubstance use  Risk to Self:   Risk to Others:   Prior Inpatient Therapy:   Prior Outpatient Therapy:    Past Medical History:  Past Medical History:  Diagnosis Date   Anxiety    Bipolar 1 disorder (HCC)    COPD (chronic obstructive pulmonary disease) (HCC)    Depression    Schizophrenia (HCC)    Substance abuse (HCC)     Past Surgical History:  Procedure Laterality Date   NO PAST SURGERIES     Family History:  Family History  Problem Relation Age of Onset   Hypertension Other    Diabetes Other    Family Psychiatric  History: unknown Social History:  Social History   Substance and Sexual Activity  Alcohol Use Yes   Alcohol/week: 12.0 standard drinks of alcohol   Types: 12 Cans of beer per week     Social History   Substance and Sexual Activity  Drug Use Yes   Types: "Crack" cocaine, Cocaine   Comment: one gram daily    Social History   Socioeconomic History   Marital status: Single    Spouse name: Not on file   Number of children: Not on file   Years of education: Not on file   Highest education  level: Not on file  Occupational History   Occupation: [employer]  Tobacco Use   Smoking status: Some Days    Current packs/day: 0.50    Types: Cigarettes   Smokeless tobacco: Never  Vaping Use   Vaping status: Never Used  Substance and Sexual Activity   Alcohol use: Yes    Alcohol/week: 12.0 standard drinks of alcohol    Types: 12 Cans of beer per week   Drug use: Yes    Types: "Crack" cocaine, Cocaine    Comment: one gram daily   Sexual activity: Yes  Other Topics Concern   Not on file  Social History Narrative   The patient was born and raised in with that by both his biological parents. His father's past way but his mother still living. He denies any history of any physical or sexual abuse. He says he completed 2 years of college at [employer]. 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 He has never been married but has a 49 year old son who lives with his mother. He says he does get to see his son and has a relationship with him. He is not currently dating or in a relationship.      The patient does have a history of a DUI and had a court date last Friday for a DUI. He denies any other pending charges.            Social Determinants of Health   Financial Resource Strain: Not on file  Food Insecurity: Food Insecurity Present (10/22/2022)   Hunger Vital Sign    Worried About Running Out of Food in the Last Year: Sometimes true    Ran Out of Food in the Last Year: Sometimes true  Transportation Needs: Unmet Transportation Needs (10/22/2022)   PRAPARE - Administrator, Civil Service (Medical): Yes    Lack of Transportation (Non-Medical): Yes  Physical Activity: Not on file  Stress: Not on file  Social Connections: Unknown (10/10/2022)   Received from [employer]   Social Network    Social Network: Not on file   Additional Social History:    Allergies:  No Known Allergies  Labs:   Results for orders placed or performed during the hospital encounter of 10/26/22 (from the past 48 hour(s))  CBC     Status: None   Collection Time: 10/26/22  2:16 AM  Result Value Ref Range   WBC 9.6 4.0 - 10.5 K/uL   RBC 4.98 4.22 - 5.81 MIL/uL   Hemoglobin 14.7 13.0 - 17.0 g/dL   HCT 84.6 96.2 - 95.2 %   MCV 88.6 80.0 - 100.0 fL   MCH 29.5 26.0 - 34.0 pg   MCHC 33.3 30.0 - 36.0 g/dL   RDW 84.1 32.4 - 40.1 %   Platelets 378 150 - 400 K/uL   nRBC 0.0 0.0 - 0.2 %  Comment: Performed at [facility], [address] [place], [place] [address]  Basic metabolic panel     Status: None   Collection Time: 10/26/22  2:16 AM  Result Value Ref Range   Sodium 135 135 - 145 mmol/L   Potassium 4.1 3.5 - 5.1 mmol/L   Chloride 101 98 - 111 mmol/L   CO2 23 22 - 32 mmol/L   Glucose, Bld 93 70 - 99 mg/dL    Comment: Glucose reference range applies only to samples taken after fasting for at least 8 hours.   BUN 15 6 - 20 mg/dL   Creatinine, Ser 3.32 0.61 - 1.24 mg/dL   Calcium 9.2 8.9 - 95.1 mg/dL   GFR, Estimated >88 >41 mL/min    Comment: (NOTE) Calculated using the CKD-EPI Creatinine Equation (2021)    Anion gap 11 5 - 15    Comment: Performed at [facility], [address] [place], [place] [address]  Troponin I (High Sensitivity)     Status: None   Collection Time: 10/26/22  2:16 AM  Result Value Ref Range   Troponin I (High Sensitivity) 4 <18 ng/L    Comment: (NOTE) Elevated high sensitivity troponin I (hsTnI) values and significant  changes across serial measurements may suggest ACS but many other  chronic and acute conditions are known to elevate hsTnI results.  Refer to the "Links" section for chest pain algorithms and additional  guidance. Performed at [facility], [address] [place], [place] [address]   Acetaminophen level     Status: Abnormal   Collection Time: 10/26/22  2:53 AM  Result Value Ref Range   Acetaminophen (Tylenol),  Serum <10 (L) 10 - 30 ug/mL    Comment: (NOTE) Therapeutic concentrations vary significantly. A range of 10-30 ug/mL  may be an effective concentration for many patients. However, some  are best treated at concentrations outside of this range. Acetaminophen concentrations >150 ug/mL at 4 hours after ingestion  and >50 ug/mL at 12 hours after ingestion are often associated with  toxic reactions.  Performed at [facility], [address] [place], [place] [address]   Salicylate level     Status: Abnormal   Collection Time: 10/26/22  2:53 AM  Result Value Ref Range   Salicylate Lvl <7.0 (L) 7.0 - 30.0 mg/dL    Comment: Performed at [facility], [address] [place], [place] [address]  Ethanol     Status: None   Collection Time: 10/26/22  2:53 AM  Result Value Ref Range   Alcohol, Ethyl (B) <10 <10 mg/dL    Comment: (NOTE) Lowest detectable limit for serum alcohol is 10 mg/dL.  For medical purposes only. Performed at [facility], [address] [place], [place] [address]   Resp panel by RT-PCR (RSV, Flu A&B, Covid) Anterior Nasal Swab     Status: None   Collection Time: 10/26/22  3:21 AM   Specimen: Anterior Nasal Swab  Result Value Ref Range   SARS Coronavirus 2 by RT PCR NEGATIVE NEGATIVE    Comment: (NOTE) SARS-CoV-2 target nucleic acids are NOT DETECTED.  The SARS-CoV-2 RNA is generally detectable in upper respiratory specimens during the acute phase of infection. The lowest concentration of SARS-CoV-2 viral copies this assay can detect is 138 copies/mL. A negative result does not preclude SARS-Cov-2 infection and should not be used as the sole basis for treatment or other patient management decisions. A negative result may occur with  improper  specimen collection/handling, submission of specimen other than nasopharyngeal swab, presence of viral mutation(s) within the areas targeted by this assay, and inadequate number of  viral copies(<138 copies/mL). A negative result must be combined with clinical observations, patient history, and epidemiological information. The expected result is Negative.  Fact Sheet for Patients:  BloggerCourse.com  Fact Sheet for Healthcare Providers:  SeriousBroker.it  This test is no t yet approved or cleared by the [place] FDA and  has been authorized for detection and/or diagnosis of SARS-CoV-2 by FDA under an Emergency Use Authorization (EUA). This EUA will remain  in effect (meaning this test can be used) for the duration of the COVID-19 declaration under Section 564(b)(1) of the Act, 21 U.S.C.section 360bbb-3(b)(1), unless the authorization is terminated  or revoked sooner.       Influenza A by PCR NEGATIVE NEGATIVE   Influenza B by PCR NEGATIVE NEGATIVE    Comment: (NOTE) The Xpert Xpress SARS-CoV-2/FLU/RSV plus assay is intended as an aid in the diagnosis of influenza from Nasopharyngeal swab specimens and should not be used as a sole basis for treatment. Nasal washings and aspirates are unacceptable for Xpert Xpress SARS-CoV-2/FLU/RSV testing.  Fact Sheet for Patients: BloggerCourse.com  Fact Sheet for Healthcare Providers: SeriousBroker.it  This test is not yet approved or cleared by the [place] FDA and has been authorized for detection and/or diagnosis of SARS-CoV-2 by FDA under an Emergency Use Authorization (EUA). This EUA will remain in effect (meaning this test can be used) for the duration of the COVID-19 declaration under Section 564(b)(1) of the Act, 21 U.S.C. section 360bbb-3(b)(1), unless the authorization is terminated or revoked.     Resp Syncytial Virus by PCR NEGATIVE NEGATIVE    Comment: (NOTE) Fact Sheet for Patients: BloggerCourse.com  Fact Sheet for Healthcare  Providers: SeriousBroker.it  This test is not yet approved or cleared by the [place] FDA and has been authorized for detection and/or diagnosis of SARS-CoV-2 by FDA under an Emergency Use Authorization (EUA). This EUA will remain in effect (meaning this test can be used) for the duration of the COVID-19 declaration under Section 564(b)(1) of the Act, 21 U.S.C. section 360bbb-3(b)(1), unless the authorization is terminated or revoked.  Performed at [facility], [address] [place], [place] [address]     Medications:  Current Facility-Administered Medications  Medication Dose Route Frequency Provider Last Rate Last Admin   albuterol (VENTOLIN HFA) 108 (90 Base) MCG/ACT inhaler 2 puff  2 puff Inhalation Q6H PRN Ward, Kristen N, DO       carvedilol (COREG) tablet 3.125 mg  3.125 mg Oral BID WC Ward, Kristen N, DO       fluticasone furoate-vilanterol (BREO ELLIPTA) 200-25 MCG/ACT 1 puff  1 puff Inhalation Daily Ward, Kristen N, DO       folic acid (FOLVITE) tablet 1 mg  1 mg Oral Daily Ward, Kristen N, DO       ipratropium-albuterol (DUONEB) 0.5-2.5 (3) MG/3ML nebulizer solution 3 mL  3 mL Nebulization Once Ward, Kristen N, DO       [START ON 10/27/2022] predniSONE (DELTASONE) tablet 60 mg  60 mg Oral Q breakfast Ward, Kristen N, DO       thiamine (VITAMIN B1) tablet 100 mg  100 mg Oral Daily Ward, Kristen N, DO       Current Outpatient Medications  Medication Sig Dispense Refill   albuterol (VENTOLIN HFA) 108 (90 Base) MCG/ACT inhaler Inhale 2 puffs into the lungs every 6 (six)  hours as needed for wheezing or shortness of breath. 6.7 g 2   azithromycin (ZITHROMAX) 250 MG tablet Take 1 tablet daily for the next 3 days 3 each 0   carvedilol (COREG) 3.125 MG tablet Take 1 tablet (3.125 mg total) by mouth 2 (two) times daily with a meal. 60 tablet 1   fluticasone-salmeterol (ADVAIR DISKUS) 250-50 MCG/ACT AEPB Inhale 1 puff into the lungs in the  morning and at bedtime. 60 each 1   folic acid (FOLVITE) 1 MG tablet Take 1 tablet (1 mg total) by mouth daily. 90 tablet 0   guaiFENesin (MUCINEX) 600 MG 12 hr tablet Take 2 tablets (1,200 mg total) by mouth 2 (two) times daily as needed for cough or to loosen phlegm. 30 tablet 0   Multiple Vitamin (MULTIVITAMIN WITH MINERALS) TABS tablet Take 1 tablet by mouth daily. 90 tablet 0   predniSONE (DELTASONE) 20 MG tablet Take 2 tablets (40 mg total) by mouth daily with breakfast for 3 days. 6 tablet 0   thiamine (VITAMIN B-1) 100 MG tablet Take 1 tablet (100 mg total) by mouth daily. 90 tablet 0    Musculoskeletal: Strength & Muscle Tone: within normal limits Gait & Station: normal Patient leans: N/A   Psychiatric Specialty Exam:  Presentation  General Appearance:  Appropriate for Environment; Casual  Eye Contact: Fair  Speech: Clear and Coherent  Speech Volume: Decreased  Handedness: Right   Mood and Affect  Mood: Anxious; Depressed; Hopeless; Worthless  Affect: Government social research officer; Tearful   Thought Process  Thought Processes: Coherent  Descriptions of Associations:Intact  Orientation:Full (Time, Place and Person)  Thought Content:Illogical; WDL  History of Schizophrenia/Schizoaffective disorder:Yes  Duration of Psychotic Symptoms:Greater than six months  Hallucinations:Hallucinations: None  Ideas of Reference:None  Suicidal Thoughts:Suicidal Thoughts: Yes, Active SI Active Intent and/or Plan: With Intent; With Plan  Homicidal Thoughts:Homicidal Thoughts: No   Sensorium  Memory: Immediate Fair; Remote Fair  Judgment: Impaired  Insight: Fair   Chartered certified accountant: Fair  Attention Span: Fair  Recall: [employer] of Knowledge: Fair  Language: Fair   Psychomotor Activity  Psychomotor Activity:Psychomotor Activity: Normal   Assets  Assets: Manufacturing systems engineer; Desire for Improvement; Resilience   Sleep   Sleep:Sleep: Poor    Physical Exam: Physical Exam Vitals and nursing note reviewed.  HENT:     Head: Normocephalic and atraumatic.  Eyes:     Pupils: Pupils are equal, round, and reactive to light.  Pulmonary:     Effort: Pulmonary effort is normal.  Musculoskeletal:        General: Normal range of motion.     Cervical back: Normal range of motion.  Skin:    General: Skin is dry.  Neurological:     Mental Status: He is alert and oriented to person, place, and time.  Psychiatric:        Attention and Perception: Attention and perception normal.        Mood and Affect: Mood is anxious and depressed.        Speech: Speech normal.        Behavior: Behavior is cooperative.        Thought Content: Thought content includes suicidal ideation. Thought content includes suicidal plan.        Cognition and Memory: Cognition and memory normal.        Judgment: Judgment is impulsive.    Review of Systems  Psychiatric/Behavioral:  Positive for depression, substance abuse and suicidal ideas.   All  other systems reviewed and are negative.  Blood pressure 134/78, pulse (!) 58, temperature 98.5 F (36.9 C), temperature source Oral, resp. rate 18, height 5\' 10"  (1.778 m), weight 70.3 kg, SpO2 95%. Body mass index is 22.24 kg/m.  Treatment Plan Summary: Daily contact with patient to assess and evaluate symptoms and progress in treatment, Medication management, and Plan  Franklin [name] was admitted to [facility] ER for Major depressive disorder, recurrent, severe without psychotic features (HCC), crisis management, and stabilization. Routine labs ordered, which include Lab Orders         Resp panel by RT-PCR (RSV, Flu A&B, Covid) Anterior Nasal Swab         CBC         Basic metabolic panel         Urine Drug Screen, Qualitative ([facility] only)         Acetaminophen level         Salicylate level         Ethanol    Medication Management: Medications started  carvedilol  3.125 mg Oral BID WC    fluticasone furoate-vilanterol  1 puff Inhalation Daily   folic acid  1 mg Oral Daily   ipratropium-albuterol  3 mL Nebulization Once   [START ON 10/27/2022] predniSONE  60 mg Oral Q breakfast   thiamine  100 mg Oral Daily   Will maintain observation checks every 15 minutes for safety. Psychosocial education regarding relapse prevention and self-care; social and communication  Social work will consult with family for collateral information and discuss discharge and follow up plan.  Disposition: Recommend psychiatric Inpatient admission when medically cleared. Supportive therapy provided about ongoing stressors. Discussed crisis plan, support from social network, calling 911, coming to the Emergency Department, and calling Suicide Hotline.  This service was provided via telemedicine using a 2-way, interactive audio and video technology.   Jearld Lesch, NP 10/26/2022 4:54 AM

## 2022-10-26 NOTE — ED Notes (Signed)
pt recieved snack and drink 

## 2022-10-26 NOTE — ED Notes (Signed)
Pt given urine specimen cup at this time and informed of need for urine sample

## 2022-10-26 NOTE — ED Notes (Signed)
Pt provided dinner tray. Pt sitting up eating.

## 2022-10-26 NOTE — ED Triage Notes (Signed)
EMS [place] for c/o [facility] and cough and left sided CP radiating into arm that began several hrs PTA; recently d/c for hx COPD (did not gets rx meds filled); albuterol enroute with some relief

## 2022-10-26 NOTE — ED Notes (Signed)
Pt provided with lunch tray. Pt sitting up eating.

## 2022-10-26 NOTE — ED Notes (Signed)
Pt provided with breakfast tray. Pt sitting up in bed eating meal.

## 2022-10-27 ENCOUNTER — Encounter: Payer: Self-pay | Admitting: Psychiatry

## 2022-10-27 ENCOUNTER — Other Ambulatory Visit: Payer: Self-pay

## 2022-10-27 ENCOUNTER — Inpatient Hospital Stay
Admission: AD | Admit: 2022-10-27 | Discharge: 2022-11-01 | DRG: 885 | Disposition: A | Discharge status: Home / Self Care | Payer: 59 | Source: Intra-hospital | Attending: Psychiatry | Admitting: Psychiatry

## 2022-10-27 DIAGNOSIS — Z7951 Long term (current) use of inhaled steroids: Secondary | ICD-10-CM

## 2022-10-27 DIAGNOSIS — F332 Major depressive disorder, recurrent severe without psychotic features: Principal | ICD-10-CM | POA: Diagnosis present

## 2022-10-27 DIAGNOSIS — Z5982 Transportation insecurity: Secondary | ICD-10-CM | POA: Diagnosis not present

## 2022-10-27 DIAGNOSIS — F1490 Cocaine use, unspecified, uncomplicated: Secondary | ICD-10-CM | POA: Diagnosis present

## 2022-10-27 DIAGNOSIS — I10 Essential (primary) hypertension: Secondary | ICD-10-CM | POA: Diagnosis present

## 2022-10-27 DIAGNOSIS — F17210 Nicotine dependence, cigarettes, uncomplicated: Secondary | ICD-10-CM | POA: Diagnosis present

## 2022-10-27 DIAGNOSIS — F1010 Alcohol abuse, uncomplicated: Secondary | ICD-10-CM | POA: Diagnosis present

## 2022-10-27 DIAGNOSIS — G479 Sleep disorder, unspecified: Secondary | ICD-10-CM | POA: Diagnosis present

## 2022-10-27 DIAGNOSIS — Z79899 Other long term (current) drug therapy: Secondary | ICD-10-CM

## 2022-10-27 DIAGNOSIS — Z7952 Long term (current) use of systemic steroids: Secondary | ICD-10-CM

## 2022-10-27 DIAGNOSIS — F419 Anxiety disorder, unspecified: Secondary | ICD-10-CM | POA: Diagnosis present

## 2022-10-27 DIAGNOSIS — R45851 Suicidal ideations: Secondary | ICD-10-CM | POA: Diagnosis present

## 2022-10-27 DIAGNOSIS — Z5941 Food insecurity: Secondary | ICD-10-CM | POA: Diagnosis not present

## 2022-10-27 DIAGNOSIS — Z634 Disappearance and death of family member: Secondary | ICD-10-CM | POA: Diagnosis not present

## 2022-10-27 DIAGNOSIS — F209 Schizophrenia, unspecified: Secondary | ICD-10-CM | POA: Diagnosis present

## 2022-10-27 DIAGNOSIS — J449 Chronic obstructive pulmonary disease, unspecified: Secondary | ICD-10-CM | POA: Diagnosis present

## 2022-10-27 DIAGNOSIS — Z8249 Family history of ischemic heart disease and other diseases of the circulatory system: Secondary | ICD-10-CM

## 2022-10-27 DIAGNOSIS — Z5900 Homelessness unspecified: Secondary | ICD-10-CM | POA: Diagnosis not present

## 2022-10-27 MED ORDER — PREDNISONE 50 MG PO TABS
60.0000 mg | ORAL_TABLET | Freq: Every day | ORAL | Status: AC
  Administered 2022-10-28 – 2022-10-30 (×3): 60 mg via ORAL
  Filled 2022-10-27 (×3): qty 1

## 2022-10-27 MED ORDER — MAGNESIUM HYDROXIDE 400 MG/5ML PO SUSP: 30.0000 mL | Freq: Every day | ORAL | Status: DC | PRN

## 2022-10-27 MED ORDER — FLUTICASONE FUROATE-VILANTEROL 200-25 MCG/ACT IN AEPB
1.0000 | INHALATION_SPRAY | Freq: Every day | RESPIRATORY_TRACT | Status: DC
  Administered 2022-10-28 – 2022-11-01 (×5): 1 via RESPIRATORY_TRACT
  Filled 2022-10-27: qty 28

## 2022-10-27 MED ORDER — FOLIC ACID 1 MG PO TABS
1.0000 mg | ORAL_TABLET | Freq: Every day | ORAL | Status: DC
  Administered 2022-10-28 – 2022-11-01 (×5): 1 mg via ORAL
  Filled 2022-10-27 (×5): qty 1

## 2022-10-27 MED ORDER — THIAMINE MONONITRATE 100 MG PO TABS
100.0000 mg | ORAL_TABLET | Freq: Every day | ORAL | Status: DC
  Administered 2022-10-28 – 2022-11-01 (×5): 100 mg via ORAL
  Filled 2022-10-27 (×5): qty 1

## 2022-10-27 MED ORDER — HYDROXYZINE HCL 25 MG PO TABS
25.0000 mg | ORAL_TABLET | Freq: Three times a day (TID) | ORAL | Status: DC | PRN
  Administered 2022-10-28 – 2022-10-30 (×3): 25 mg via ORAL
  Filled 2022-10-27 (×3): qty 1

## 2022-10-27 MED ORDER — ALBUTEROL SULFATE HFA 108 (90 BASE) MCG/ACT IN AERS
2.0000 | INHALATION_SPRAY | Freq: Four times a day (QID) | RESPIRATORY_TRACT | Status: DC | PRN
  Administered 2022-10-29: 2 via RESPIRATORY_TRACT

## 2022-10-27 MED ORDER — OLANZAPINE 10 MG PO TABS: 10.0000 mg | ORAL_TABLET | Freq: Two times a day (BID) | ORAL | Status: DC | PRN

## 2022-10-27 MED ORDER — CARVEDILOL 3.125 MG PO TABS
3.1250 mg | ORAL_TABLET | Freq: Two times a day (BID) | ORAL | Status: DC
  Administered 2022-10-28 – 2022-11-01 (×9): 3.125 mg via ORAL
  Filled 2022-10-27 (×11): qty 1

## 2022-10-27 MED ORDER — ALUM & MAG HYDROXIDE-SIMETH 200-200-20 MG/5ML PO SUSP: 30.0000 mL | ORAL | Status: DC | PRN

## 2022-10-27 MED ORDER — OLANZAPINE 10 MG IM SOLR: 10.0000 mg | Freq: Two times a day (BID) | INTRAMUSCULAR | Status: DC | PRN

## 2022-10-27 MED ORDER — ACETAMINOPHEN 325 MG PO TABS: 650.0000 mg | ORAL_TABLET | Freq: Four times a day (QID) | ORAL | Status: DC | PRN

## 2022-10-27 NOTE — Tx Team (Signed)
Initial Treatment Plan 10/27/2022 9:13 PM [name] MWN:[id number]    PATIENT STRESSORS: Financial difficulties   Health problems   Marital or family conflict   Substance abuse     PATIENT STRENGTHS: Motivation for treatment/growth    PATIENT IDENTIFIED PROBLEMS: Family conflict   Substance abuse  Suicidal ideation                  DISCHARGE CRITERIA:  Adequate post-discharge living arrangements Motivation to continue treatment in a less acute level of care  PRELIMINARY DISCHARGE PLAN: Attend aftercare/continuing care group  PATIENT/FAMILY INVOLVEMENT: This treatment plan has been presented to and reviewed with the patient, Franklin [name].  The patient and family have been given the opportunity to ask questions and make suggestions.  Neysa Bonito, RN 10/27/2022, 9:13 PM

## 2022-10-27 NOTE — ED Notes (Signed)
During nursing assessment Mr Debuhr was A/Ox 4 .  He  stated that he has fleeting thoughts about SI, however reports no currently thoughts or feelings of SI/HI.  Mr Vansomeren reported that he is not currently having auditory or visual hallucinations.  Pt affect is congruent with situation , eye contact is good , speech is normal rate and volume  with  appropriate verbiage noted.  Staff addressed any feelings or concerns that have been brought up.  Medications were administered as ordered. Continue to monitor patient as ordered for any changes in behaviors and for continued safety.

## 2022-10-27 NOTE — ED Provider Notes (Signed)
Emergency Medicine Observation Re-evaluation Note  Franklin [name] is a 52 y.o. male, seen on rounds today.  Pt initially presented to the ED for complaints of Chest Pain Currently, the patient is resting, voices no medical complaints.  Physical Exam  BP 132/80 (BP Location: Right Arm)   Pulse 70   Temp 98.6 F (37 C) (Oral)   Resp 17   Ht 5\' 10"  (1.778 m)   Wt 70.3 kg   SpO2 97%   BMI 22.24 kg/m  Physical Exam General: Resting in no acute distress Cardiac: No cyanosis Lungs: Equal rise and fall Psych: Not agitated  ED Course / MDM  EKG:EKG Interpretation Date/Time:  Tuesday October 26 2022 02:44:18 EDT Ventricular Rate:  110 PR Interval:  128 QRS Duration:  86 QT Interval:  340 QTC Calculation: 460 R Axis:   59  Text Interpretation: Sinus tachycardia with Premature supraventricular complexes and Fusion complexes Right atrial enlargement Borderline ECG When compared with ECG of 24-Sep-2022 06:07, PREVIOUS ECG IS PRESENT Confirmed by Rochele Raring [phone]) on 10/26/2022 3:11:57 AM  I have reviewed the labs performed to date as well as medications administered while in observation.  Recent changes in the last 24 hours include no events overnight.  Plan  Current plan is for psychiatric disposition.    Irean Hong, MD 10/27/22 [phone]

## 2022-10-27 NOTE — ED Notes (Signed)
Vol / recommend psych inpatient admission when medically cleared

## 2022-10-27 NOTE — Plan of Care (Signed)
New Admission  Problem: Education: Goal: Knowledge of [place] [facility] Education information/materials will improve Outcome: Not Progressing Goal: Emotional status will improve Outcome: Not Progressing Goal: Mental status will improve Outcome: Not Progressing Goal: Verbalization of understanding the information provided will improve Outcome: Not Progressing   Problem: Coping: Goal: Ability to verbalize frustrations and anger appropriately will improve Outcome: Not Progressing Goal: Ability to demonstrate self-control will improve Outcome: Not Progressing

## 2022-10-27 NOTE — ED Notes (Signed)
Pt given dinner tray and beverage  

## 2022-10-27 NOTE — ED Notes (Signed)
Pt denied shower

## 2022-10-27 NOTE — ED Notes (Signed)
Pt given PM snack at this time.  

## 2022-10-27 NOTE — Group Note (Signed)
Date:  10/27/2022 Time:  10:46 PM  Group Topic/Focus:  Retail banker and activity    Participation Level:  Did Not Attend  Participation Quality:   none  Affect:   none  Cognitive:   none  Insight: None  Engagement in Group:   none  Modes of Intervention:   none  Additional Comments:  none   [name] 10/27/2022, 10:46 PM

## 2022-10-27 NOTE — OR Nursing (Signed)
Admission Note:  51 yr male who presents Vol in due to an acute copd exacerbation but also for the treatment of SI and Depression. Pt appears flat. Pt was calm and cooperative with admission process. Pt presents with passive SI and contracts for safety upon admission. Pt denies AVH . Skin assessment was done with [facility] RN.Patient states that he is here to get help. Patients denies tobacco use and states he drinks about 24 beers a days. He is also a cocaine user.PT searched and no contraband found, POC and unit policies explained and understanding verbalized. Consents obtained. Food and fluids offered, and fluids accepted. Pt had no additional questions or concerns

## 2022-10-27 NOTE — ED Notes (Signed)
VOL inpatient psych

## 2022-10-28 DIAGNOSIS — F332 Major depressive disorder, recurrent severe without psychotic features: Secondary | ICD-10-CM

## 2022-10-28 MED ORDER — DIAZEPAM 5 MG PO TABS
5.0000 mg | ORAL_TABLET | Freq: Two times a day (BID) | ORAL | Status: DC
  Administered 2022-10-28 – 2022-10-30 (×4): 5 mg via ORAL
  Filled 2022-10-28 (×4): qty 1

## 2022-10-28 MED ORDER — RISPERIDONE 1 MG PO TABS
0.5000 mg | ORAL_TABLET | ORAL | Status: DC
  Administered 2022-10-28 – 2022-11-01 (×9): 0.5 mg via ORAL
  Filled 2022-10-28 (×9): qty 1

## 2022-10-28 MED ORDER — TRAZODONE HCL 100 MG PO TABS
100.0000 mg | ORAL_TABLET | Freq: Every day | ORAL | Status: DC
Start: 2022-10-28 — End: 2022-11-01
  Administered 2022-10-28 – 2022-10-31 (×4): 100 mg via ORAL
  Filled 2022-10-28 (×3): qty 1

## 2022-10-28 MED ORDER — FLUOXETINE HCL 20 MG PO CAPS
20.0000 mg | ORAL_CAPSULE | Freq: Every day | ORAL | Status: DC
  Administered 2022-10-28 – 2022-11-01 (×5): 20 mg via ORAL
  Filled 2022-10-28 (×5): qty 1

## 2022-10-28 NOTE — Group Note (Signed)
Recreation Therapy Group Note   Group Topic:Relaxation  Group Date: 10/28/2022 Start Time: 1000 End Time: 1045 Facilitators: Rosina Lowenstein, LRT, CTRS Location:  [facility]  Group Description: PMR (Progressive Muscle Relaxation). LRT asks patients their current level of stress/anxiety from 1-10, with 10 being the highest. LRT educates patients on what PMR is and the benefits that come from it. Patients are asked to sit with their feet flat on the floor while sitting up and all the way back in their chair, if possible. LRT and pts follow a prompt through a speaker that requires you to tense and release different muscles in their body and focus on their breathing. During session, lights are off and soft music is being played. Pts are given a stress ball to use if needed. At the end of the prompt, LRT asks patients to rank their current levels of stress/anxiety from 1-10, 10 being the highest. LRT provides patients with an education handout on PMR.   Goal Area(s) Addressed:  Patients will be able to describe progressive muscle relaxation.  Patient will practice using relaxation technique. Patient will identify a new coping skill.  Patient will follow multistep directions to reduce anxiety and stress.   Affect/Mood: N/A   Participation Level: Did not attend    Clinical Observations/Individualized Feedback: Teng did not attend group.  Plan: Continue to engage patient in RT group sessions 2-3x/week.   Rosina Lowenstein, LRT, CTRS 10/28/2022 11:24 AM

## 2022-10-28 NOTE — H&P (Signed)
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  Franklin [name] has had multiple hospitalizations for substance induced mood disorder the last time was in 2018.  Both his parents are deceased and he is homeless.  His son passed away 3 years ago after being shot and his mom recently passed away a few months ago so he is homeless because he was living with her.  He endorses anhedonia, difficulty sleeping, anxiety, depressed mood, hopelessness and helplessness.  He would like to go to a long-term treatment program.  He is from [name].  He has a history of COPD and hypertension.  He is currently not on any psychiatric medications.  He has been using crack cocaine and alcohol.  Nurse practitioner psychiatric evaluation in the emergency room is as follows: [name], 52 y.o., male patient seen via tele health by this provider; chart reviewed and consulted with Dr. Elesa Massed on 10/26/22.  On evaluation [name] reports that his depression and thoughts of killing himself has been getting "worst and worst" for the past few weeks.  He admits to  past SA about 5 years ago where he tried to hang himself.  Currently, he says he sleeps for extended periods of time.  He is unfortunately homeless and has been  for the past 2 months. He states that his  mom passed away and sold the house to his sister, he sister then out him out. He admits  smoking crack "al the time". He says he doesn't want to feel anything. He admits to prior psych hospitals.  Last admission was about 5years ago.  He also reports past substance abuse treatment.  He says he was sober about 10 years ago for about 8 years. However, he says his was killed 4 years ago and has "been getting high and states he  hasn't been "right since".       He is alert/oriented x 4; somber/depressed/calm/cooperative; and mood congruent with affect.  Patient is speaking in a clear tone at moderate volume, and normal pace; with good eye contact.  His thought process is coherent and relevant; There is no indication that he is currently responding to internal/external stimuli or experiencing delusional thought content.  As it pertains to AVH, patient states that he cannot tell if he hears voices or if its  in his.  Patient endorses suicidal/self-harm with a plan to jump off of a bridge.  Currently feeling hopeless, worthless,lonely and sad.  He denies homicidal ideation, psychosis, and paranoia.  Patient has remained calm throughout assessment and has answered questions appropriately.   Associated Signs/Symptoms: Depression Symptoms:  depressed mood, hopelessness, suicidal thoughts without plan, anxiety, (Hypo) Manic Symptoms:   No Anxiety Symptoms:  Excessive Worry, Panic Symptoms, Social Anxiety, Psychotic Symptoms:   No PTSD Symptoms: NA Total Time spent with patient: 1 hour  Past Psychiatric History: As above  Is the patient at risk to self? Yes.    Has the patient been a risk to self in the past 6 months? Yes.    Has the patient been a risk  to self within the distant past? Yes.    Is the patient a risk to others? No.  Has the patient been a risk to others in the past 6 months? No.  Has the patient been a risk to others within the distant past? No.   [place] Scale:  Flowsheet Row Admission (Current) from 10/27/2022 in [facility] INPATIENT BEH[facility]AL MEDICINE ED from 10/26/2022 in  [facility] Emergency Department at [facility] ED to Hosp-Admission (Discharged) from 10/22/2022 in [facility] CARDIAC MED PCU  C-SSRS RISK CATEGORY Error: Q7 should not be populated when Q6 is No High Risk No Risk        Prior Inpatient Therapy: Yes.   If yes, describe as above Prior Outpatient Therapy: Yes.     Alcohol Screening: 1. How often do you have a drink containing alcohol?: 4 or more times a week 2. How many drinks containing alcohol do you have on a typical day when you are drinking?: 10 or more 3. How often do you have six or more drinks on one occasion?: Daily or almost daily AUDIT-C Score: 12 4. How often during the last year have you found that you were not able to stop drinking once you had started?: Never 5. How often during the last year have you failed to do what was normally expected from you because of drinking?: Less than monthly 6. How often during the last year have you needed a first drink in the morning to get yourself going after a heavy drinking session?: Less than monthly 7. How often during the last year have you had a feeling of guilt of remorse after drinking?: Never 8. How often during the last year have you been unable to remember what happened the night before because you had been drinking?: Never 9. Have you or someone else been injured as a result of your drinking?: No 10. Has a relative or friend or a doctor or another health worker been concerned about your drinking or suggested you cut down?: No Alcohol Use Disorder Identification Test Final Score (AUDIT): 14 Substance Abuse History in the last 12 months:  Yes.   Consequences of Substance Abuse: Withdrawal Symptoms:   Hypertension Previous Psychotropic Medications: Yes  Psychological Evaluations: Yes  Past Medical History:  Past Medical History:  Diagnosis Date   Anxiety    Bipolar 1 disorder (HCC)    COPD (chronic obstructive pulmonary disease) (HCC)    Depression     Schizophrenia (HCC)    Substance abuse (HCC)     Past Surgical History:  Procedure Laterality Date   NO PAST SURGERIES     Family History:  Family History  Problem Relation Age of Onset   Hypertension Other    Diabetes Other    Family Psychiatric  History: Unremarkable Tobacco Screening:  Social History   Tobacco Use  Smoking Status Some Days   Current packs/day: 0.50   Types: Cigarettes  Smokeless Tobacco Never    BH Tobacco Counseling     Are you interested in Tobacco Cessation Medications?  No value filed. Counseled patient on smoking cessation:  No value filed. Reason Tobacco Screening Not Completed: No value filed.       Social History:  Social History   Substance and Sexual Activity  Alcohol Use Yes   Alcohol/week: 12.0 standard drinks of alcohol   Types: 12 Cans of beer per week     Social History   Substance and Sexual Activity  Drug Use Yes   Types: "  Crack" cocaine, Cocaine   Comment: one gram daily    Additional Social History:                           Allergies:  No Known Allergies Lab Results:  Results for orders placed or performed during the hospital encounter of 10/26/22 (from the past 48 hour(s))  Urine Drug Screen, Qualitative ([facility] only)     Status: Abnormal   Collection Time: 10/26/22  1:45 PM  Result Value Ref Range   Tricyclic, Ur Screen NONE DETECTED NONE DETECTED   Amphetamines, Ur Screen NONE DETECTED NONE DETECTED   MDMA (Ecstasy)Ur Screen NONE DETECTED NONE DETECTED   Cocaine Metabolite,Ur [place] POSITIVE (A) NONE DETECTED   Opiate, Ur Screen NONE DETECTED NONE DETECTED   Phencyclidine (PCP) Ur S NONE DETECTED NONE DETECTED   Cannabinoid 50 Ng, Ur [place] NONE DETECTED NONE DETECTED   Barbiturates, Ur Screen NONE DETECTED NONE DETECTED   Benzodiazepine, Ur Scrn POSITIVE (A) NONE DETECTED   Methadone Scn, Ur NONE DETECTED NONE DETECTED    Comment: (NOTE) Tricyclics + metabolites, urine    Cutoff 1000 ng/mL Amphetamines +  metabolites, urine  Cutoff 1000 ng/mL MDMA (Ecstasy), urine              Cutoff 500 ng/mL Cocaine Metabolite, urine          Cutoff 300 ng/mL Opiate + metabolites, urine        Cutoff 300 ng/mL Phencyclidine (PCP), urine         Cutoff 25 ng/mL Cannabinoid, urine                 Cutoff 50 ng/mL Barbiturates + metabolites, urine  Cutoff 200 ng/mL Benzodiazepine, urine              Cutoff 200 ng/mL Methadone, urine                   Cutoff 300 ng/mL  The urine drug screen provides only a preliminary, unconfirmed analytical test result and should not be used for non-medical purposes. Clinical consideration and professional judgment should be applied to any positive drug screen result due to possible interfering substances. A more specific alternate chemical method must be used in order to obtain a confirmed analytical result. Gas chromatography / mass spectrometry (GC/MS) is the preferred confirm atory method. Performed at [facility], [address] [place], [place] [address]     Blood Alcohol level:  Lab Results  Component Value Date   [facility] <10 10/26/2022   ETH <10 05/28/2022    Metabolic Disorder Labs:  Lab Results  Component Value Date   HGBA1C 5.9 (A) 07/12/2022   MPG 117 02/02/2009   No results found for: "PROLACTIN" Lab Results  Component Value Date   CHOL 159 10/08/2014   TRIG 255 (H) 10/08/2014   HDL 45 10/08/2014   CHOLHDL 3.5 10/08/2014   VLDL 51 (H) 10/08/2014   LDLCALC 63 10/08/2014   LDLCALC  07/27/2009    83        Total Cholesterol/HDL:CHD Risk Coronary Heart Disease Risk Table                     Men   Women  1/2 Average Risk   3.4   3.3  Average Risk       5.0   4.4  2 X Average Risk   9.6   7.1  3 X  Average Risk  23.4   11.0        Use the calculated Patient Ratio above and the CHD Risk Table to determine the patient's CHD Risk.        ATP III CLASSIFICATION (LDL):  <100     mg/dL   Optimal  161-096  mg/dL   Near or Above                     Optimal  130-159  mg/dL   Borderline  045-409  mg/dL   High  >811     mg/dL   Very High    Current Medications: Current Facility-Administered Medications  Medication Dose Route Frequency Provider Last Rate Last Admin   acetaminophen (TYLENOL) tablet 650 mg  650 mg Oral Q6H PRN Charm Rings, NP       albuterol (VENTOLIN HFA) 108 (90 Base) MCG/ACT inhaler 2 puff  2 puff Inhalation Q6H PRN Charm Rings, NP       alum & mag hydroxide-simeth (MAALOX/MYLANTA) 200-200-20 MG/5ML suspension 30 mL  30 mL Oral Q4H PRN Charm Rings, NP       carvedilol (COREG) tablet 3.125 mg  3.125 mg Oral BID WC Charm Rings, NP   3.125 mg at 10/28/22 0858   fluticasone furoate-vilanterol (BREO ELLIPTA) 200-25 MCG/ACT 1 puff  1 puff Inhalation Daily Charm Rings, NP   1 puff at 10/28/22 0900   folic acid (FOLVITE) tablet 1 mg  1 mg Oral Daily Charm Rings, NP   1 mg at 10/28/22 9147   hydrOXYzine (ATARAX) tablet 25 mg  25 mg Oral TID PRN Charm Rings, NP   25 mg at 10/28/22 0856   magnesium hydroxide (MILK OF MAGNESIA) suspension 30 mL  30 mL Oral Daily PRN Charm Rings, NP       OLANZapine (ZYPREXA) tablet 10 mg  10 mg Oral BID PRN Charm Rings, NP       Or   OLANZapine (ZYPREXA) injection 10 mg  10 mg Intramuscular BID PRN Charm Rings, NP       predniSONE (DELTASONE) tablet 60 mg  60 mg Oral Q breakfast Charm Rings, NP   60 mg at 10/28/22 8295   thiamine (VITAMIN B1) tablet 100 mg  100 mg Oral Daily Charm Rings, NP   100 mg at 10/28/22 6213   PTA Medications: Medications Prior to Admission  Medication Sig Dispense Refill Last Dose   albuterol (VENTOLIN HFA) 108 (90 Base) MCG/ACT inhaler Inhale 2 puffs into the lungs every 6 (six) hours as needed for wheezing or shortness of breath. 6.7 g 2    azithromycin (ZITHROMAX) 250 MG tablet Take 1 tablet daily for the next 3 days (Patient not taking: Reported on 10/26/2022) 3 each 0    carvedilol (COREG) 3.125 MG tablet  Take 1 tablet (3.125 mg total) by mouth 2 (two) times daily with a meal. 60 tablet 1    fluticasone-salmeterol (ADVAIR DISKUS) 250-50 MCG/ACT AEPB Inhale 1 puff into the lungs in the morning and at bedtime. 60 each 1    folic acid (FOLVITE) 1 MG tablet Take 1 tablet (1 mg total) by mouth daily. 90 tablet 0    guaiFENesin (MUCINEX) 600 MG 12 hr tablet Take 2 tablets (1,200 mg total) by mouth 2 (two) times daily as needed for cough or to loosen phlegm. 30 tablet 0    Multiple Vitamin (MULTIVITAMIN WITH MINERALS) TABS tablet  Take 1 tablet by mouth daily. 90 tablet 0    predniSONE (DELTASONE) 20 MG tablet Take 2 tablets (40 mg total) by mouth daily with breakfast for 3 days. 6 tablet 0    thiamine (VITAMIN B-1) 100 MG tablet Take 1 tablet (100 mg total) by mouth daily. 90 tablet 0     Musculoskeletal: Strength & Muscle Tone: within normal limits Gait & Station: normal Patient leans: N/A            Psychiatric Specialty Exam:  Presentation  General Appearance:  Appropriate for Environment; Casual  Eye Contact: Fair  Speech: Clear and Coherent  Speech Volume: Decreased  Handedness: Right   Mood and Affect  Mood: Anxious; Depressed; Hopeless; Worthless  Affect: Government social research officer; Tearful   Thought Process  Thought Processes: Coherent  Duration of Psychotic Symptoms:N/A Past Diagnosis of Schizophrenia or Psychoactive disorder: Yes  Descriptions of Associations:Intact  Orientation:Full (Time, Place and Person)  Thought Content:Illogical; WDL  Hallucinations:No data recorded Ideas of Reference:None  Suicidal Thoughts:No data recorded Homicidal Thoughts:No data recorded  Sensorium  Memory: Immediate Fair; Remote Fair  Judgment: Impaired  Insight: Fair   Chartered certified accountant: Fair  Attention Span: Fair  Recall: [employer] of Knowledge: Fair  Language: Fair   Psychomotor Activity  Psychomotor Activity:No data  recorded  Assets  Assets: Communication Skills; Desire for Improvement; Resilience   Sleep  Sleep:No data recorded   Physical Exam: Physical Exam Vitals and nursing note reviewed.  Constitutional:      Appearance: Normal appearance. He is normal weight.  HENT:     Head: Normocephalic and atraumatic.     Nose: Nose normal.     Mouth/Throat:     Pharynx: Oropharynx is clear.  Eyes:     Extraocular Movements: Extraocular movements intact.     Pupils: Pupils are equal, round, and reactive to light.  Cardiovascular:     Rate and Rhythm: Normal rate and regular rhythm.     Pulses: Normal pulses.     Heart sounds: Normal heart sounds.  Pulmonary:     Effort: Pulmonary effort is normal.     Breath sounds: Normal breath sounds.  Abdominal:     General: Abdomen is flat. Bowel sounds are normal.     Palpations: Abdomen is soft.  Musculoskeletal:        General: Normal range of motion.     Cervical back: Normal range of motion and neck supple.  Skin:    General: Skin is warm and dry.  Neurological:     General: No focal deficit present.     Mental Status: He is alert and oriented to person, place, and time.  Psychiatric:        Attention and Perception: Attention and perception normal.        Mood and Affect: Mood is depressed. Affect is flat.        Speech: Speech normal.        Behavior: Behavior normal. Behavior is cooperative.        Thought Content: Thought content normal.        Cognition and Memory: Cognition and memory normal.        Judgment: Judgment is impulsive.    Review of Systems  Constitutional: Negative.   HENT: Negative.    Eyes: Negative.   Respiratory: Negative.    Cardiovascular: Negative.   Gastrointestinal: Negative.   Genitourinary: Negative.   Musculoskeletal: Negative.   Skin: Negative.   Neurological: Negative.  Endo/Heme/Allergies: Negative.   Psychiatric/Behavioral:  Positive for depression, substance abuse and suicidal ideas. The  patient is nervous/anxious and has insomnia.    Blood pressure 117/66, pulse 67, temperature 98.3 F (36.8 C), resp. rate 18, height 5\' 10"  (1.778 m), weight 68.9 kg, SpO2 96%. Body mass index is 21.81 kg/m.  Treatment Plan Summary: Daily contact with patient to assess and evaluate symptoms and progress in treatment, Medication management, and Plan see orders  Observation Level/Precautions:  15 minute checks  Laboratory:  CBC Chemistry Profile  Psychotherapy:    Medications:    Consultations:    Discharge Concerns:    Estimated LOS:  Other:     Physician Treatment Plan for Primary Diagnosis: Major depressive disorder, recurrent severe without psychotic features (HCC) Long Term Goal(s): Improvement in symptoms so as ready for discharge  Short Term Goals: Ability to identify changes in lifestyle to reduce recurrence of condition will improve, Ability to verbalize feelings will improve, Ability to disclose and discuss suicidal ideas, Ability to demonstrate self-control will improve, Ability to identify and develop effective coping behaviors will improve, Ability to maintain clinical measurements within normal limits will improve, Compliance with prescribed medications will improve, and Ability to identify triggers associated with substance abuse/mental health issues will improve  Physician Treatment Plan for Secondary Diagnosis: Principal Problem:   Major depressive disorder, recurrent severe without psychotic features (HCC)   I certify that inpatient services furnished can reasonably be expected to improve the patient's condition.    Jamel Dunton Tresea Mall, DO 10/24/202410:41 AM

## 2022-10-28 NOTE — BHH Suicide Risk Assessment (Signed)
[facility] Admission Suicide Risk Assessment   Nursing information obtained from:  Patient Demographic factors:  Male Current Mental Status:  NA Loss Factors:  NA Historical Factors:  NA Risk Reduction Factors:  NA  Total Time spent with patient: 1 hour Principal Problem: Major depressive disorder, recurrent severe without psychotic features (HCC) Diagnosis:  Principal Problem:   Major depressive disorder, recurrent severe without psychotic features (HCC)  Subjective Data: Franklin [name], 52 y.o., male patient seen via tele health by this provider; chart reviewed and consulted with Dr. Elesa Massed on 10/26/22.  On evaluation Franklin [name] reports that his depression and thoughts of killing himself has been getting "worst and worst" for the past few weeks.  He admits to  past SA about 5 years ago where he tried to hang himself.  Currently, he says he sleeps for extended periods of time.  He is unfortunately homeless and has been  for the past 2 months. He states that his  mom passed away and sold the house to his sister, he sister then out him out. He admits smoking crack "al the time". He says he doesn't want to feel anything. He admits to prior psych hospitals.  Last admission was about 5years ago.  He also reports past substance abuse treatment.  He says he was sober about 10 years ago for about 8 years. However, he says his was killed 4 years ago and has "been getting high and states he  hasn't been "right since".       He is alert/oriented x 4; somber/depressed/calm/cooperative; and mood congruent with affect.  Patient is speaking in a clear tone at moderate volume, and normal pace; with good eye contact.  His thought process is coherent and relevant; There is no indication that he is currently responding to internal/external stimuli or experiencing delusional thought content.  As it pertains to AVH, patient states that he cannot tell if he hears voices or if its  in his.  Patient endorses suicidal/self-harm  with a plan to jump off of a bridge.  Currently feeling hopeless, worthless,lonely and sad.  He denies homicidal ideation, psychosis, and paranoia.  Patient has remained calm throughout assessment and has answered questions appropriately.   Continued Clinical Symptoms:  Alcohol Use Disorder Identification Test Final Score (AUDIT): 14 The "Alcohol Use Disorders Identification Test", Guidelines for Use in Primary Care, Second Edition.  World Science writer [facility]). Score between 0-7:  no or low risk or alcohol related problems. Score between 8-15:  moderate risk of alcohol related problems. Score between 16-19:  high risk of alcohol related problems. Score 20 or above:  warrants further diagnostic evaluation for alcohol dependence and treatment.   CLINICAL FACTORS:   Depression:   Anhedonia Alcohol/Substance Abuse/Dependencies   Musculoskeletal: Strength & Muscle Tone: within normal limits Gait & Station: normal Patient leans: N/A  Psychiatric Specialty Exam:  Presentation  General Appearance:  Appropriate for Environment; Casual  Eye Contact: Fair  Speech: Clear and Coherent  Speech Volume: Decreased  Handedness: Right   Mood and Affect  Mood: Anxious; Depressed; Hopeless; Worthless  Affect: Government social research officer; Tearful   Thought Process  Thought Processes: Coherent  Descriptions of Associations:Intact  Orientation:Full (Time, Place and Person)  Thought Content:Illogical; WDL  History of Schizophrenia/Schizoaffective disorder:Yes  Duration of Psychotic Symptoms:Greater than six months  Hallucinations:No data recorded Ideas of Reference:None  Suicidal Thoughts:No data recorded Homicidal Thoughts:No data recorded  Sensorium  Memory: Immediate Fair; Remote Fair  Judgment: Impaired  Insight: Curator  Functions  Concentration: Fair  Attention Span: Fair  Recall: [employer] of Knowledge: Fair  Language: Fair   Psychomotor  Activity  Psychomotor Activity:No data recorded  Assets  Assets: Communication Skills; Desire for Improvement; Resilience   Sleep  Sleep:No data recorded    Blood pressure 117/66, pulse 67, temperature 98.3 F (36.8 C), resp. rate 18, height 5\' 10"  (1.778 m), weight 68.9 kg, SpO2 96%. Body mass index is 21.81 kg/m.   COGNITIVE FEATURES THAT CONTRIBUTE TO RISK:  None    SUICIDE RISK:   Minimal: No identifiable suicidal ideation.  Patients presenting with no risk factors but with morbid ruminations; may be classified as minimal risk based on the severity of the depressive symptoms  PLAN OF CARE: See orders  I certify that inpatient services furnished can reasonably be expected to improve the patient's condition.   Sarina Ill, DO 10/28/2022, 10:39 AM

## 2022-10-28 NOTE — Progress Notes (Signed)
Pt calm and pleasant during assessment denying SI/HI/AVH. Pt observed by this Clinical research associate interacting appropriately with staff and peers on the unit. Pt compliant with medication administration per MD orders. Pt given education, support, and encouragement to be active in his treatment plan. Pt being monitored Q 15 minutes for safety per unit protocol, remains safe on the unit

## 2022-10-28 NOTE — Group Note (Signed)
Date:  10/28/2022 Time:  10:04 AM  Group Topic/Focus:  Early Warning Signs:   The focus of this group is to help patients identify signs or symptoms they exhibit before slipping into an unhealthy state or crisis.    Participation Level:  Did Not Attend   Mary Sella Jiya Kissinger 10/28/2022, 10:04 AM

## 2022-10-28 NOTE — Group Note (Signed)
[facility] LCSW Group Therapy Note   Group Date: 10/28/2022 Start Time: 1315 End Time: 1430  Type of Therapy and Topic:  Group Therapy:  Feelings around Relapse and Recovery  Participation Level:  Did Not Attend   Mood:  Description of Group:    Patients in this group will discuss emotions they experience before and after a relapse. They will process how experiencing these feelings, or avoidance of experiencing them, relates to having a relapse. Facilitator will guide patients to explore emotions they have related to recovery. Patients will be encouraged to process which emotions are more powerful. They will be guided to discuss the emotional reaction significant others in their lives may have to patients' relapse or recovery. Patients will be assisted in exploring ways to respond to the emotions of others without this contributing to a relapse.  Therapeutic Goals: Patient will identify two or more emotions that lead to relapse for them:  Patient will identify two emotions that result when they relapse:  Patient will identify two emotions related to recovery:  Patient will demonstrate ability to communicate their needs through discussion and/or role plays.   Summary of Patient Progress:  X   Therapeutic Modalities:   Cognitive Behavioral Therapy Solution-Focused Therapy Assertiveness Training Relapse Prevention Therapy   Harden Mo, LCSW

## 2022-10-28 NOTE — [facility] Counselor (Signed)
Adult Comprehensive Assessment  Patient ID: Franklin [name], male   DOB: 1970-08-09, 52 y.o.   MRN: [id number]  Information Source: Information source: Patient  Current Stressors:  Patient states their primary concerns and needs for treatment are:: Substance Abuse (cocaine and alcohol) and my mental health Patient states their goals for this hospitilization and ongoing recovery are:: To get myself and my medications together; to complete a packet to apply for medicaid, and to complete disability paperwork Educational / Learning stressors: None reported Employment / Job issues: Yes. Pt reports he has been unable to obtain a job due to being homeless Family Relationships: Pt reports his mother recently passed away and he has no contact with any of his siblings or extended family Financial / Lack of resources (include bankruptcy): Pt. has no Pharmacist, hospital / Lack of housing: Pt is currently homeless Physical health (include injuries & life threatening diseases): Pt is Dx with COPD Social relationships: Pt denies any social relationships or support Substance abuse: Cocaine and Alcohol Bereavement / Loss: Pt's mother passed away 2 months ago and was his only support. Pt also reports that his only son was killed 4 years ago.  Living/Environment/Situation:  Living Arrangements: Other (Comment) (pt is currently homeless) How long has patient lived in current situation?: 2 months  Family History:  Marital status: Single Are you sexually active?: No What is your sexual orientation?: Straight/Heterosexual Has your sexual activity been affected by drugs, alcohol, medication, or emotional stress?: None reported Does patient have children?: No (pt's only son passed away)  Childhood History:  By whom was/is the patient raised?: Both parents Description of patient's relationship with caregiver when they were a child: Pt states he had a good relationship with both parents Patient's  description of current relationship with people who raised him/her: N/a. Pt's parents are deceased How were you disciplined when you got in trouble as a child/adolescent?: "It was just normal" Does patient have siblings?: Yes Number of Siblings: 9 Description of patient's current relationship with siblings: Pt reports he has no contact with any of his siblings Did patient suffer any verbal/emotional/physical/sexual abuse as a child?: No Did patient suffer from severe childhood neglect?: No Has patient ever been sexually abused/assaulted/raped as an adolescent or adult?: No Was the patient ever a victim of a crime or a disaster?: No Witnessed domestic violence?: No Has patient been affected by domestic violence as an adult?: No  Education:  Highest grade of school patient has completed: 2 years of college Currently a student?: No Learning disability?: No  Employment/Work Situation:   Employment Situation: Unemployed Patient's Job has Been Impacted by Current Illness:  (Pt reports he couldn't be stable or responsible enough to keep a job) What is the Longest Time Patient has Held a Job?: 5 years 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?: No  Financial Resources:   Financial resources: No income Does patient have a Lawyer or guardian?: No  Alcohol/Substance Abuse:   What has been your use of drugs/alcohol within the last 12 months?: Cocaine and Alcohol If attempted suicide, did drugs/alcohol play a role in this?: Yes Alcohol/Substance Abuse Treatment Hx: Past Tx, Inpatient If yes, describe treatment: Pt reports he received inpatient tx via [facility] approximately 5 years ago Has alcohol/substance abuse ever caused legal problems?: No  Social Support System:   [employer] Support System: None Type of faith/religion: Ephriam Knuckles How does patient's faith help to cope  with current illness?: "It  doesn't obviously"  Leisure/Recreation:   Do You Have Hobbies?: No  Strengths/Needs:   What is the patient's perception of their strengths?: "I don't have any" Patient states these barriers may affect/interfere with their treatment: Pt states there are no barriears to tx Patient states these barriers may affect their return to the community: Pt is homeless and has no options for housing Other important information patient would like considered in planning for their treatment: Pt requests long-term substance use inpatient tc  Discharge Plan:   Currently receiving community mental health services: No Patient states concerns and preferences for aftercare planning are: Pt requests referral for mental health tx Patient states they will know when they are safe and ready for discharge when: "I don't know how I will know." Does patient have access to transportation?: No Does patient have financial barriers related to discharge medications?: Yes Patient description of barriers related to discharge medications: Pt has no insurance and no income Plan for no access to transportation at discharge: CSW will assist with transportation resources Plan for living situation after discharge: Pt currently has no viable options for housing post discharge. CSW will assist with housing resources Will patient be returning to same living situation after discharge?: No  Summary/Recommendations:   Summary and Recommendations (to be completed by the evaluator): Pt is a 52 y/o male from [place] who presented to the hospital due to SI and depression. Pt reports he was residing with his mother until 2 months ago when she passed away and that he has no other family or social supports. He reports that he has no financial resources and no Aeronautical engineer. He described his plans for discharge to seek long term residential tx for substance abuse. Recommendations Include:crisis stabilization,therapeutic milieu,encourage  group attendance and participation,medication management for mood stabilization and development of comprehensive mental wellness and sobriety plan.  Felecia Shelling Keilany Burnette. 10/28/2022

## 2022-10-28 NOTE — Group Note (Signed)
Date:  10/28/2022 Time:  8:56 PM  Group Topic/Focus:  Goals Group:   The focus of this group is to help patients establish daily goals to achieve during treatment and discuss how the patient can incorporate goal setting into their daily lives to aide in recovery.    Participation Level:  Active  Participation Quality:  Appropriate, Attentive, Sharing, and Supportive  Affect:  Appropriate  Cognitive:  Appropriate  Insight: Appropriate and Good  Engagement in Group:  Supportive  Modes of Intervention:  Support  Additional Comments:     Belva Crome 10/28/2022, 8:56 PM

## 2022-10-28 NOTE — Plan of Care (Signed)
Patient verbalized anxiety this morning. Vistaril given. Patient stated that he feels relaxed at this time. Patient states his goal is " be on the right medications for my mental health and likes to go for the long term rehab for substance abuse." Patient denies SI,HI and AVH. Visible in the milieu. Appropriate with staff & peers. Appetite and energy level good. Support and encouragement given.

## 2022-10-29 DIAGNOSIS — F332 Major depressive disorder, recurrent severe without psychotic features: Secondary | ICD-10-CM | POA: Diagnosis not present

## 2022-10-29 NOTE — Progress Notes (Signed)
[facility] MD Progress Note  10/29/2022 2:23 PM Franklin [name]  MRN:  [id number] Subjective: Franklin [name] is seen on rounds.  He says that he is feeling better.  He has been compliant with medications.  He would like to go to drug and alcohol rehab.  He says that he needs more food on his plate.  He has been homeless since his mom passed away a few months ago.  No side effects from his medications.  Nurses report no issues. Principal Problem: Major depressive disorder, recurrent severe without psychotic features (HCC) Diagnosis: Principal Problem:   Major depressive disorder, recurrent severe without psychotic features (HCC)  Total Time spent with patient: 15 minutes  Past Psychiatric History: Polysubstance abuse and depression  Past Medical History:  Past Medical History:  Diagnosis Date   Anxiety    Bipolar 1 disorder (HCC)    COPD (chronic obstructive pulmonary disease) (HCC)    Depression    Schizophrenia (HCC)    Substance abuse (HCC)     Past Surgical History:  Procedure Laterality Date   NO PAST SURGERIES     Family History:  Family History  Problem Relation Age of Onset   Hypertension Other    Diabetes Other    Family Psychiatric  History: Unremarkable Social History:  Social History   Substance and Sexual Activity  Alcohol Use Yes   Alcohol/week: 12.0 standard drinks of alcohol   Types: 12 Cans of beer per week     Social History   Substance and Sexual Activity  Drug Use Yes   Types: "Crack" cocaine, Cocaine   Comment: one gram daily    Social History   Socioeconomic History   Marital status: Single    Spouse name: Not on file   Number of children: Not on file   Years of education: Not on file   Highest education level: Not on file  Occupational History   Occupation: [employer]  Tobacco Use   Smoking status: Some Days    Current packs/day: 0.50    Types: Cigarettes   Smokeless tobacco: Never  Vaping Use   Vaping status: Never Used  Substance and Sexual Activity    Alcohol use: Yes    Alcohol/week: 12.0 standard drinks of alcohol    Types: 12 Cans of beer per week   Drug use: Yes    Types: "Crack" cocaine, Cocaine    Comment: one gram daily   Sexual activity: Yes  Other Topics Concern   Not on file  Social History Narrative   The patient was born and raised in with that by both his biological parents. His father's past way but his mother still living. He denies any history of any physical or sexual abuse. He says he completed 2 years of college at [employer]. 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 He has never been married but has a 30 year old son who lives with his mother. He says he does get to see his son and has a relationship with him. He is not currently dating or in a relationship.      The patient does have a history of a DUI and had a court date last Friday for a DUI. He denies any other pending charges.            Social Determinants of Health   Financial Resource Strain: Not on file  Food Insecurity: Food Insecurity Present (10/27/2022)   Hunger  Vital Sign    Worried About Programme researcher, broadcasting/film/video in the Last Year: Sometimes true    Ran Out of Food in the Last Year: Sometimes true  Transportation Needs: Unmet Transportation Needs (10/27/2022)   PRAPARE - Administrator, Civil Service (Medical): Yes    Lack of Transportation (Non-Medical): Yes  Physical Activity: Not on file  Stress: Not on file  Social Connections: Unknown (10/10/2022)   Received from [employer]   Social Network    Social Network: Not on file   Additional Social History:                         Sleep: Good  Appetite:  Good  Current Medications: Current Facility-Administered Medications  Medication Dose Route Frequency Provider Last Rate Last Admin   acetaminophen (TYLENOL) tablet 650 mg  650 mg Oral Q6H PRN Charm Rings, NP       albuterol (VENTOLIN  HFA) 108 (90 Base) MCG/ACT inhaler 2 puff  2 puff Inhalation Q6H PRN Charm Rings, NP       alum & mag hydroxide-simeth (MAALOX/MYLANTA) 200-200-20 MG/5ML suspension 30 mL  30 mL Oral Q4H PRN Charm Rings, NP       carvedilol (COREG) tablet 3.125 mg  3.125 mg Oral BID WC Charm Rings, NP   3.125 mg at 10/29/22 0759   diazepam (VALIUM) tablet 5 mg  5 mg Oral Q12H Sarina Ill, DO   5 mg at 10/29/22 1151   FLUoxetine (PROZAC) capsule 20 mg  20 mg Oral Daily Sarina Ill, DO   20 mg at 10/29/22 0759   fluticasone furoate-vilanterol (BREO ELLIPTA) 200-25 MCG/ACT 1 puff  1 puff Inhalation Daily Charm Rings, NP   1 puff at 10/29/22 0800   folic acid (FOLVITE) tablet 1 mg  1 mg Oral Daily Charm Rings, NP   1 mg at 10/29/22 3016   hydrOXYzine (ATARAX) tablet 25 mg  25 mg Oral TID PRN Charm Rings, NP   25 mg at 10/28/22 2007   magnesium hydroxide (MILK OF MAGNESIA) suspension 30 mL  30 mL Oral Daily PRN Charm Rings, NP       OLANZapine (ZYPREXA) tablet 10 mg  10 mg Oral BID PRN Charm Rings, NP       Or   OLANZapine (ZYPREXA) injection 10 mg  10 mg Intramuscular BID PRN Charm Rings, NP       predniSONE (DELTASONE) tablet 60 mg  60 mg Oral Q breakfast Charm Rings, NP   60 mg at 10/29/22 0759   risperiDONE (RISPERDAL) tablet 0.5 mg  0.5 mg Oral BH-[id number] Sarina Ill, DO   0.5 mg at 10/29/22 0109   thiamine (VITAMIN B1) tablet 100 mg  100 mg Oral Daily Charm Rings, NP   100 mg at 10/29/22 0759   traZODone (DESYREL) tablet 100 mg  100 mg Oral QHS Sarina Ill, DO   100 mg at 10/28/22 2101    Lab Results: No results found for this or any previous visit (from the past 48 hour(s)).  Blood Alcohol level:  Lab Results  Component Value Date   [facility] <10 10/26/2022   ETH <10 05/28/2022    Metabolic Disorder Labs: Lab Results  Component Value Date   HGBA1C 5.9 (A) 07/12/2022   MPG 117 02/02/2009   No results found for:  "PROLACTIN" Lab Results  Component Value  Date   CHOL 159 10/08/2014   TRIG 255 (H) 10/08/2014   HDL 45 10/08/2014   CHOLHDL 3.5 10/08/2014   VLDL 51 (H) 10/08/2014   LDLCALC 63 10/08/2014   LDLCALC  07/27/2009    83        Total Cholesterol/HDL:CHD Risk Coronary Heart Disease Risk Table                     Men   Women  1/2 Average Risk   3.4   3.3  Average Risk       5.0   4.4  2 X Average Risk   9.6   7.1  3 X Average Risk  23.4   11.0        Use the calculated Patient Ratio above and the CHD Risk Table to determine the patient's CHD Risk.        ATP III CLASSIFICATION (LDL):  <100     mg/dL   Optimal  161-096  mg/dL   Near or Above                    Optimal  130-159  mg/dL   Borderline  045-409  mg/dL   High  >811     mg/dL   Very High    Physical Findings: AIMS:  , ,  ,  ,    CIWA:    COWS:     Musculoskeletal: Strength & Muscle Tone: within normal limits Gait & Station: normal Patient leans: N/A  Psychiatric Specialty Exam:  Presentation  General Appearance:  Appropriate for Environment; Casual  Eye Contact: Fair  Speech: Clear and Coherent  Speech Volume: Decreased  Handedness: Right   Mood and Affect  Mood: Anxious; Depressed; Hopeless; Worthless  Affect: Government social research officer; Tearful   Thought Process  Thought Processes: Coherent  Descriptions of Associations:Intact  Orientation:Full (Time, Place and Person)  Thought Content:Illogical; WDL  History of Schizophrenia/Schizoaffective disorder:Yes  Duration of Psychotic Symptoms:Greater than six months  Hallucinations:No data recorded Ideas of Reference:None  Suicidal Thoughts:No data recorded Homicidal Thoughts:No data recorded  Sensorium  Memory: Immediate Fair; Remote Fair  Judgment: Impaired  Insight: Fair   Chartered certified accountant: Fair  Attention Span: Fair  Recall: [employer] of Knowledge: Fair  Language: Fair   Psychomotor Activity   Psychomotor Activity:No data recorded  Assets  Assets: Communication Skills; Desire for Improvement; Resilience   Sleep  Sleep:No data recorded    Blood pressure 111/63, pulse 68, temperature (!) 97.3 F (36.3 C), resp. rate 16, height 5\' 10"  (1.778 m), weight 68.9 kg, SpO2 99%. Body mass index is 21.81 kg/m.   Treatment Plan Summary: Daily contact with patient to assess and evaluate symptoms and progress in treatment, Medication management, and Plan continue current medications.  Sarina Ill, DO 10/29/2022, 2:23 PM

## 2022-10-29 NOTE — Group Note (Signed)
Date:  10/29/2022 Time:  5:39 PM  Group Topic/Focus:  Activity Group:  The focus of the group is to promote activity and encourage the patients to go outside to the courtyard and get some fresh air and some exercise.    Participation Level:  Did Not Attend   Mary Sella Merril Nagy 10/29/2022, 5:39 PM

## 2022-10-29 NOTE — Group Note (Signed)
Date:  10/29/2022 Time:  9:35 PM  Group Topic/Focus:  Wrap-Up Group:   The focus of this group is to help patients review their daily goal of treatment and discuss progress on daily workbooks.    Participation Level:  Active  Participation Quality:  Appropriate, Attentive, Sharing, and Supportive  Affect:  Appropriate  Cognitive:  Appropriate  Insight: Appropriate and Good  Engagement in Group:  Supportive  Modes of Intervention:  Support  Additional Comments:     Belva Crome 10/29/2022, 9:35 PM

## 2022-10-29 NOTE — [facility] Suicide Risk Assessment (Signed)
[facility] INPATIENT:  Family/Significant Other Suicide Prevention Education  Suicide Prevention Education:  Patient Refusal for Family/Significant Other Suicide Prevention Education: The patient Franklin [name] has refused to provide written consent for family/significant other to be provided Family/Significant Other Suicide Prevention Education during admission and/or prior to discharge.  Physician notified.  SPE completed with pt, as pt refused to consent to family contact. SPI pamphlet provided to pt and pt was encouraged to share information with support network, ask questions, and talk about any concerns relating to SPE. Pt denies access to guns/firearms and verbalized understanding of information provided. Mobile Crisis information also provided to pt.   Harden Mo 10/29/2022, 4:00 PM

## 2022-10-29 NOTE — Group Note (Signed)
Recreation Therapy Group Note   Group Topic:Leisure Education  Group Date: 10/29/2022 Start Time: 1015 End Time: 1120 Facilitators: Rosina Lowenstein, LRT, CTRS Location:  Craft Room  Group Description: Leisure. Patients were given the option to choose from singing karaoke, coloring mandalas, using oil pastels, journaling, or playing with play-doh. LRT and pts discussed the meaning of leisure, the importance of participating in leisure during their free time/when they're outside of the hospital, as well as how our leisure interests can also serve as coping skills.   Goal Area(s) Addressed:  Patient will identify a current leisure interest.  Patient will learn the definition of "leisure". Patient will practice making a positive decision. Patient will have the opportunity to try a new leisure activity. Patient will communicate with peers and LRT.    Affect/Mood: Appropriate   Participation Level: Active and Engaged   Participation Quality: Independent   Behavior: Calm and Cooperative   Speech/Thought Process: Coherent   Insight: Good   Judgement: Good   Modes of Intervention: Activity   Patient Response to Interventions:  Attentive, Engaged, Interested , and Receptive   Education Outcome:  Acknowledges education   Clinical Observations/Individualized Feedback: Franklin [name] was active in their participation of session activities and group discussion. Pt identified "eat and walk" as things he does in his free time. Pt chose to listen to music while in group. Pt interacted well with LRT and peers duration of session.    Plan: Continue to engage patient in RT group sessions 2-3x/week.   Rosina Lowenstein, LRT, CTRS 10/29/2022 12:25 PM

## 2022-10-29 NOTE — Plan of Care (Signed)
Patient calm and cooperative on approach. Appropriate with staff & peers. Patient stated that he is always hungry. Double portion ordered. Patient denies SI,HI and AVH. Appetite and energy level good. Support and encouragement given.

## 2022-10-29 NOTE — Plan of Care (Signed)
Pt denies SI/HI/AVH, compliant with procedures on the unit   Problem: Education: Goal: Knowledge of [facility] Education information/materials will improve Outcome: Progressing Goal: Emotional status will improve Outcome: Progressing Goal: Mental status will improve Outcome: Progressing Goal: Verbalization of understanding the information provided will improve Outcome: Progressing   Problem: Activity: Goal: Interest or engagement in activities will improve Outcome: Progressing Goal: Sleeping patterns will improve Outcome: Progressing   Problem: Coping: Goal: Ability to verbalize frustrations and anger appropriately will improve Outcome: Progressing Goal: Ability to demonstrate self-control will improve Outcome: Progressing   Problem: Health Behavior/Discharge Planning: Goal: Identification of resources available to assist in meeting health care needs will improve Outcome: Progressing Goal: Compliance with treatment plan for underlying cause of condition will improve Outcome: Progressing   Problem: Physical Regulation: Goal: Ability to maintain clinical measurements within normal limits will improve Outcome: Progressing   Problem: Safety: Goal: Periods of time without injury will increase Outcome: Progressing

## 2022-10-29 NOTE — BH IP Treatment Plan (Signed)
Interdisciplinary Treatment and Diagnostic Plan Update  10/29/2022 Time of Session: 9:18 AM  Franklin [name] MRN: [id number]  Principal Diagnosis: Major depressive disorder, recurrent severe without psychotic features (HCC)  Secondary Diagnoses: Principal Problem:   Major depressive disorder, recurrent severe without psychotic features (HCC)   Current Medications:  Current Facility-Administered Medications  Medication Dose Route Frequency Provider Last Rate Last Admin   acetaminophen (TYLENOL) tablet 650 mg  650 mg Oral Q6H PRN Charm Rings, NP       albuterol (VENTOLIN HFA) 108 (90 Base) MCG/ACT inhaler 2 puff  2 puff Inhalation Q6H PRN Charm Rings, NP       alum & mag hydroxide-simeth (MAALOX/MYLANTA) 200-200-20 MG/5ML suspension 30 mL  30 mL Oral Q4H PRN Charm Rings, NP       carvedilol (COREG) tablet 3.125 mg  3.125 mg Oral BID WC Charm Rings, NP   3.125 mg at 10/29/22 0759   diazepam (VALIUM) tablet 5 mg  5 mg Oral Q12H Sarina Ill, DO   5 mg at 10/28/22 2338   FLUoxetine (PROZAC) capsule 20 mg  20 mg Oral Daily Sarina Ill, DO   20 mg at 10/29/22 0759   fluticasone furoate-vilanterol (BREO ELLIPTA) 200-25 MCG/ACT 1 puff  1 puff Inhalation Daily Charm Rings, NP   1 puff at 10/29/22 0800   folic acid (FOLVITE) tablet 1 mg  1 mg Oral Daily Charm Rings, NP   1 mg at 10/29/22 0759   hydrOXYzine (ATARAX) tablet 25 mg  25 mg Oral TID PRN Charm Rings, NP   25 mg at 10/28/22 2007   magnesium hydroxide (MILK OF MAGNESIA) suspension 30 mL  30 mL Oral Daily PRN Charm Rings, NP       OLANZapine (ZYPREXA) tablet 10 mg  10 mg Oral BID PRN Charm Rings, NP       Or   OLANZapine (ZYPREXA) injection 10 mg  10 mg Intramuscular BID PRN Charm Rings, NP       predniSONE (DELTASONE) tablet 60 mg  60 mg Oral Q breakfast Charm Rings, NP   60 mg at 10/29/22 0759   risperiDONE (RISPERDAL) tablet 0.5 mg  0.5 mg Oral BH-q8a4p Sarina Ill, DO   0.5 mg at 10/29/22 4098   thiamine (VITAMIN B1) tablet 100 mg  100 mg Oral Daily Charm Rings, NP   100 mg at 10/29/22 0759   traZODone (DESYREL) tablet 100 mg  100 mg Oral QHS Sarina Ill, DO   100 mg at 10/28/22 2101   PTA Medications: Medications Prior to Admission  Medication Sig Dispense Refill Last Dose   albuterol (VENTOLIN HFA) 108 (90 Base) MCG/ACT inhaler Inhale 2 puffs into the lungs every 6 (six) hours as needed for wheezing or shortness of breath. 6.7 g 2    azithromycin (ZITHROMAX) 250 MG tablet Take 1 tablet daily for the next 3 days (Patient not taking: Reported on 10/26/2022) 3 each 0    carvedilol (COREG) 3.125 MG tablet Take 1 tablet (3.125 mg total) by mouth 2 (two) times daily with a meal. 60 tablet 1    fluticasone-salmeterol (ADVAIR DISKUS) 250-50 MCG/ACT AEPB Inhale 1 puff into the lungs in the morning and at bedtime. 60 each 1    folic acid (FOLVITE) 1 MG tablet Take 1 tablet (1 mg total) by mouth daily. 90 tablet 0    guaiFENesin (MUCINEX) 600 MG 12 hr tablet Take 2  tablets (1,200 mg total) by mouth 2 (two) times daily as needed for cough or to loosen phlegm. 30 tablet 0    Multiple Vitamin (MULTIVITAMIN WITH MINERALS) TABS tablet Take 1 tablet by mouth daily. 90 tablet 0    [EXPIRED] predniSONE (DELTASONE) 20 MG tablet Take 2 tablets (40 mg total) by mouth daily with breakfast for 3 days. 6 tablet 0    thiamine (VITAMIN B-1) 100 MG tablet Take 1 tablet (100 mg total) by mouth daily. 90 tablet 0     Patient Stressors: Financial difficulties   Health problems   Marital or family conflict   Substance abuse    Patient Strengths: Motivation for treatment/growth   Treatment Modalities: Medication Management, Group therapy, Case management,  1 to 1 session with clinician, Psychoeducation, Recreational therapy.   Physician Treatment Plan for Primary Diagnosis: Major depressive disorder, recurrent severe without psychotic features (HCC) Long  Term Goal(s): Improvement in symptoms so as ready for discharge   Short Term Goals: Ability to identify changes in lifestyle to reduce recurrence of condition will improve Ability to verbalize feelings will improve Ability to disclose and discuss suicidal ideas Ability to demonstrate self-control will improve Ability to identify and develop effective coping behaviors will improve Ability to maintain clinical measurements within normal limits will improve Compliance with prescribed medications will improve Ability to identify triggers associated with substance abuse/mental health issues will improve  Medication Management: Evaluate patient's response, side effects, and tolerance of medication regimen.  Therapeutic Interventions: 1 to 1 sessions, Unit Group sessions and Medication administration.  Evaluation of Outcomes: Progressing  Physician Treatment Plan for Secondary Diagnosis: Principal Problem:   Major depressive disorder, recurrent severe without psychotic features (HCC)  Long Term Goal(s): Improvement in symptoms so as ready for discharge   Short Term Goals: Ability to identify changes in lifestyle to reduce recurrence of condition will improve Ability to verbalize feelings will improve Ability to disclose and discuss suicidal ideas Ability to demonstrate self-control will improve Ability to identify and develop effective coping behaviors will improve Ability to maintain clinical measurements within normal limits will improve Compliance with prescribed medications will improve Ability to identify triggers associated with substance abuse/mental health issues will improve     Medication Management: Evaluate patient's response, side effects, and tolerance of medication regimen.  Therapeutic Interventions: 1 to 1 sessions, Unit Group sessions and Medication administration.  Evaluation of Outcomes: Progressing   RN Treatment Plan for Primary Diagnosis: Major depressive  disorder, recurrent severe without psychotic features (HCC) Long Term Goal(s): Knowledge of disease and therapeutic regimen to maintain health will improve  Short Term Goals: Ability to remain free from injury will improve, Ability to verbalize frustration and anger appropriately will improve, Ability to demonstrate self-control, Ability to participate in decision making will improve, Ability to verbalize feelings will improve, Ability to disclose and discuss suicidal ideas, Ability to identify and develop effective coping behaviors will improve, and Compliance with prescribed medications will improve  Medication Management: RN will administer medications as ordered by provider, will assess and evaluate patient's response and provide education to patient for prescribed medication. RN will report any adverse and/or side effects to prescribing provider.  Therapeutic Interventions: 1 on 1 counseling sessions, Psychoeducation, Medication administration, Evaluate responses to treatment, Monitor vital signs and CBGs as ordered, Perform/monitor CIWA, COWS, AIMS and Fall Risk screenings as ordered, Perform wound care treatments as ordered.  Evaluation of Outcomes: Progressing   LCSW Treatment Plan for Primary Diagnosis: Major depressive disorder, recurrent severe  without psychotic features [facility]) Long Term Goal(s): Safe transition to appropriate next level of care at discharge, Engage patient in therapeutic group addressing interpersonal concerns.  Short Term Goals: Engage patient in aftercare planning with referrals and resources, Increase social support, Increase ability to appropriately verbalize feelings, Increase emotional regulation, Facilitate acceptance of mental health diagnosis and concerns, Facilitate patient progression through stages of change regarding substance use diagnoses and concerns, Identify triggers associated with mental health/substance abuse issues, and Increase skills for wellness and  recovery  Therapeutic Interventions: Assess for all discharge needs, 1 to 1 time with Social worker, Explore available resources and support systems, Assess for adequacy in community support network, Educate family and significant other(s) on suicide prevention, Complete Psychosocial Assessment, Interpersonal group therapy.  Evaluation of Outcomes: Progressing   Progress in Treatment: Attending groups: Yes. and No. Participating in groups: Yes. and No. Taking medication as prescribed: Yes. Toleration medication: Yes. Family/Significant other contact made: No, will contact:  CSW will contact if given permission  Patient understands diagnosis: Yes. Discussing patient identified problems/goals with staff: Yes. Medical problems stabilized or resolved: Yes. Denies suicidal/homicidal ideation: Yes. Issues/concerns per patient self-inventory: No. Other: None   New problem(s) identified: No, Describe:  None identified   New Short Term/Long Term Goal(s): elimination of symptoms of psychosis, medication management for mood stabilization; elimination of SI thoughts; development of comprehensive mental wellness/sobriety plan.   Patient Goals:  "I want to go to rehab"   Discharge Plan or Barriers: CSW will assist with appropriate discharge planning   Reason for Continuation of Hospitalization: Depression Medication stabilization  Estimated Length of Stay: 1 to 7 days   Last 3 [place] Suicide Severity Risk Score: Flowsheet Row Admission (Current) from 10/27/2022 in [facility] INPATIENT BEHAVIORAL MEDICINE ED from 10/26/2022 in [facility] Emergency Department at [facility] ED to Hosp-Admission (Discharged) from 10/22/2022 in [facility] REGIONAL CARDIAC MED PCU  C-SSRS RISK CATEGORY Error: Q7 should not be populated when Q6 is No High Risk No Risk       Last PHQ 2/9 Scores:     No data to display          Scribe for Treatment Team: Elza Rafter, Theresia Majors 10/29/2022 10:35  AM

## 2022-10-30 DIAGNOSIS — F332 Major depressive disorder, recurrent severe without psychotic features: Secondary | ICD-10-CM | POA: Diagnosis not present

## 2022-10-30 MED ORDER — DIAZEPAM 2 MG PO TABS
2.0000 mg | ORAL_TABLET | Freq: Two times a day (BID) | ORAL | Status: DC
  Administered 2022-10-30 – 2022-10-31 (×3): 2 mg via ORAL
  Filled 2022-10-30 (×3): qty 1

## 2022-10-30 NOTE — Group Note (Signed)
Date:  10/30/2022 Time:  1:46 PM  Group Topic/Focus:  Activity Group:  The focus of the group is to promote activity for the patients to encourage exercise to go out in the courtyard and get some exercise.     Participation Level:  Did Not Attend   Franklin [name] 10/30/2022, 1:46 PM

## 2022-10-30 NOTE — Group Note (Signed)
Date:  10/30/2022 Time:  8:47 PM  Group Topic/Focus:  Music therapy    Participation Level:  Active  Participation Quality:  Appropriate and Attentive  Affect:  Appropriate  Cognitive:  Alert and Appropriate  Insight: Appropriate and Good  Engagement in Group:  Developing/Improving  Modes of Intervention:  Limit-setting  Additional Comments:     [name] 10/30/2022, 8:47 PM

## 2022-10-30 NOTE — Plan of Care (Signed)
  Problem: Education: Goal: Verbalization of understanding the information provided will improve Outcome: Progressing   Problem: Activity: Goal: Interest or engagement in activities will improve Outcome: Progressing   

## 2022-10-30 NOTE — Progress Notes (Signed)
[facility] MD Progress Note  10/30/2022 10:29 AM Franklin [name]  MRN:  [id number] Subjective: Franklin [name] is seen on rounds.  He says that he is feeling better.  He has decided on a few drug and alcohol rehab centers but does not have the phone numbers.  I told him that staff will get that to him.  States that he slept well last night.  He denies any suicidal ideation.  He is tolerating the medications without any side effects.  Nurses report no issues. Principal Problem: Major depressive disorder, recurrent severe without psychotic features (HCC) Diagnosis: Principal Problem:   Major depressive disorder, recurrent severe without psychotic features (HCC)  Total Time spent with patient: 15 minutes  Past Psychiatric History: Polysubstance abuse and depression  Past Medical History:  Past Medical History:  Diagnosis Date   Anxiety    Bipolar 1 disorder (HCC)    COPD (chronic obstructive pulmonary disease) (HCC)    Depression    Schizophrenia (HCC)    Substance abuse (HCC)     Past Surgical History:  Procedure Laterality Date   NO PAST SURGERIES     Family History:  Family History  Problem Relation Age of Onset   Hypertension Other    Diabetes Other    Family Psychiatric  History: Unremarkable Social History:  Social History   Substance and Sexual Activity  Alcohol Use Yes   Alcohol/week: 12.0 standard drinks of alcohol   Types: 12 Cans of beer per week     Social History   Substance and Sexual Activity  Drug Use Yes   Types: "Crack" cocaine, Cocaine   Comment: one gram daily    Social History   Socioeconomic History   Marital status: Single    Spouse name: Not on file   Number of children: Not on file   Years of education: Not on file   Highest education level: Not on file  Occupational History   Occupation: [employer]  Tobacco Use   Smoking status: Some Days    Current packs/day: 0.50    Types: Cigarettes   Smokeless tobacco: Never  Vaping Use   Vaping status: Never Used   Substance and Sexual Activity   Alcohol use: Yes    Alcohol/week: 12.0 standard drinks of alcohol    Types: 12 Cans of beer per week   Drug use: Yes    Types: "Crack" cocaine, Cocaine    Comment: one gram daily   Sexual activity: Yes  Other Topics Concern   Not on file  Social History Narrative   The patient was born and raised in with that by both his biological parents. His father's past way but his mother still living. He denies any history of any physical or sexual abuse. He says he completed 2 years of college at [employer]. 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 He has never been married but has a 57 year old son who lives with his mother. He says he does get to see his son and has a relationship with him. He is not currently dating or in a relationship.      The patient does have a history of a DUI and had a court date last Friday for a DUI. He denies any other pending charges.            Social Determinants of Health   Financial Resource Strain: Not on file  Food Insecurity: Food Insecurity Present (  10/27/2022)   Hunger Vital Sign    Worried About Running Out of Food in the Last Year: Sometimes true    Ran Out of Food in the Last Year: Sometimes true  Transportation Needs: Unmet Transportation Needs (10/27/2022)   PRAPARE - Administrator, Civil Service (Medical): Yes    Lack of Transportation (Non-Medical): Yes  Physical Activity: Not on file  Stress: Not on file  Social Connections: Unknown (10/10/2022)   Received from [employer]   Social Network    Social Network: Not on file   Additional Social History:                         Sleep: Good  Appetite:  Good  Current Medications: Current Facility-Administered Medications  Medication Dose Route Frequency Provider Last Rate Last Admin   acetaminophen (TYLENOL) tablet 650 mg  650 mg Oral Q6H PRN Charm Rings, NP       albuterol (VENTOLIN HFA) 108 (90 Base) MCG/ACT inhaler 2 puff  2 puff Inhalation Q6H PRN Charm Rings, NP   2 puff at 10/29/22 1825   alum & mag hydroxide-simeth (MAALOX/MYLANTA) 200-200-20 MG/5ML suspension 30 mL  30 mL Oral Q4H PRN Charm Rings, NP       carvedilol (COREG) tablet 3.125 mg  3.125 mg Oral BID WC Charm Rings, NP   3.125 mg at 10/30/22 0900   diazepam (VALIUM) tablet 2 mg  2 mg Oral Q12H Sharonne Ricketts Edward, DO       FLUoxetine (PROZAC) capsule 20 mg  20 mg Oral Daily Sarina Ill, DO   20 mg at 10/30/22 0900   fluticasone furoate-vilanterol (BREO ELLIPTA) 200-25 MCG/ACT 1 puff  1 puff Inhalation Daily Charm Rings, NP   1 puff at 10/30/22 0900   folic acid (FOLVITE) tablet 1 mg  1 mg Oral Daily Charm Rings, NP   1 mg at 10/30/22 0900   hydrOXYzine (ATARAX) tablet 25 mg  25 mg Oral TID PRN Charm Rings, NP   25 mg at 10/30/22 0041   magnesium hydroxide (MILK OF MAGNESIA) suspension 30 mL  30 mL Oral Daily PRN Charm Rings, NP       OLANZapine (ZYPREXA) tablet 10 mg  10 mg Oral BID PRN Charm Rings, NP       Or   OLANZapine (ZYPREXA) injection 10 mg  10 mg Intramuscular BID PRN Charm Rings, NP       risperiDONE (RISPERDAL) tablet 0.5 mg  0.5 mg Oral BH-q8a4p Sarina Ill, DO   0.5 mg at 10/30/22 0900   thiamine (VITAMIN B1) tablet 100 mg  100 mg Oral Daily Charm Rings, NP   100 mg at 10/30/22 0900   traZODone (DESYREL) tablet 100 mg  100 mg Oral QHS Sarina Ill, DO   100 mg at 10/29/22 2116    Lab Results: No results found for this or any previous visit (from the past 48 hour(s)).  Blood Alcohol level:  Lab Results  Component Value Date   ETH <10 10/26/2022   ETH <10 05/28/2022    Metabolic Disorder Labs: Lab Results  Component Value Date   HGBA1C 5.9 (A) 07/12/2022   MPG 117 02/02/2009   No results found for: "PROLACTIN" Lab Results  Component Value Date   CHOL 159 10/08/2014   TRIG  255 (H) 10/08/2014   HDL 45 10/08/2014   CHOLHDL  3.5 10/08/2014   VLDL 51 (H) 10/08/2014   LDLCALC 63 10/08/2014   LDLCALC  07/27/2009    83        Total Cholesterol/HDL:CHD Risk Coronary Heart Disease Risk Table                     Men   Women  1/2 Average Risk   3.4   3.3  Average Risk       5.0   4.4  2 X Average Risk   9.6   7.1  3 X Average Risk  23.4   11.0        Use the calculated Patient Ratio above and the CHD Risk Table to determine the patient's CHD Risk.        ATP III CLASSIFICATION (LDL):  <100     mg/dL   Optimal  191-478  mg/dL   Near or Above                    Optimal  130-159  mg/dL   Borderline  295-621  mg/dL   High  >308     mg/dL   Very High    Physical Findings: AIMS:  , ,  ,  ,    CIWA:    COWS:     Musculoskeletal: Strength & Muscle Tone: within normal limits Gait & Station: normal Patient leans: N/A  Psychiatric Specialty Exam:  Presentation  General Appearance:  Appropriate for Environment; Casual  Eye Contact: Fair  Speech: Clear and Coherent  Speech Volume: Decreased  Handedness: Right   Mood and Affect  Mood: Anxious; Depressed; Hopeless; Worthless  Affect: Government social research officer; Tearful   Thought Process  Thought Processes: Coherent  Descriptions of Associations:Intact  Orientation:Full (Time, Place and Person)  Thought Content:Illogical; WDL  History of Schizophrenia/Schizoaffective disorder:Yes  Duration of Psychotic Symptoms:Greater than six months  Hallucinations:No data recorded Ideas of Reference:None  Suicidal Thoughts:No data recorded Homicidal Thoughts:No data recorded  Sensorium  Memory: Immediate Fair; Remote Fair  Judgment: Impaired  Insight: Fair   Chartered certified accountant: Fair  Attention Span: Fair  Recall: [employer] of Knowledge: Fair  Language: Fair   Psychomotor Activity  Psychomotor Activity:No data recorded  Assets  Assets: Communication  Skills; Desire for Improvement; Resilience   Sleep  Sleep:No data recorded    Blood pressure (!) 152/73, pulse 80, temperature (!) 97.5 F (36.4 C), temperature source Oral, resp. rate 18, height 5\' 10"  (1.778 m), weight 68.9 kg, SpO2 100%. Body mass index is 21.81 kg/m.   Treatment Plan Summary: Daily contact with patient to assess and evaluate symptoms and progress in treatment, Medication management, and Plan continue current medications.  Decrease Valium to 2 mg twice a day.  Lipid panel in the morning.  Sarina Ill, DO 10/30/2022, 10:29 AM

## 2022-10-30 NOTE — Plan of Care (Signed)
  Problem: Education: Goal: Mental status will improve Outcome: Progressing   Problem: Health Behavior/Discharge Planning: Goal: Compliance with treatment plan for underlying cause of condition will improve Outcome: Progressing   Problem: Safety: Goal: Periods of time without injury will increase Outcome: Progressing   

## 2022-10-30 NOTE — Group Note (Deleted)
Date:  10/30/2022 Time:  2:51 PM  Group Topic/Focus:  Self Care:   The focus of this group is to help patients understand the importance of self-care in order to improve or restore emotional, physical, spiritual, interpersonal, and financial health.     Participation Level:  {BHH PARTICIPATION ZOXWR:60454}  Participation Quality:  {BHH PARTICIPATION QUALITY:22265}  Affect:  {BHH AFFECT:22266}  Cognitive:  {BHH COGNITIVE:22267}  Insight: {BHH Insight2:20797}  Engagement in Group:  {BHH ENGAGEMENT IN UJWJX:91478}  Modes of Intervention:  {BHH MODES OF INTERVENTION:22269}  Additional Comments:  ***  Rella Egelston 10/30/2022, 2:51 PM

## 2022-10-30 NOTE — Progress Notes (Signed)
   10/29/22 2000  Psych Admission Type (Psych Patients Only)  Admission Status Voluntary  Psychosocial Assessment  Patient Complaints Insomnia  Eye Contact Fair  Facial Expression Flat  Affect Appropriate to circumstance  Speech Logical/coherent  Interaction Assertive  Motor Activity Slow  Appearance/Hygiene Unremarkable  Behavior Characteristics Cooperative;Appropriate to situation;Calm  Mood Pleasant  Thought Process  Coherency WDL  Content WDL  Delusions None reported or observed  Perception WDL  Hallucination None reported or observed  Judgment Impaired  Confusion WDL  Danger to Self  Current suicidal ideation? Denies

## 2022-10-30 NOTE — Progress Notes (Signed)
Patient presents appropriately and cooperative in milieu. Patient stated he slept fine last night. Patient demonstrates adequate appetite. Denies any GI issues or constipation. Patient rates his depression 5, anxiety 5, and hopelessness a 5 out of 10. Patient denies SI,HI, and A/V/H with no plan or intent. Patient's goal for today is to work on his "concentration." Patient stated he would try to attend groups and is interested in "[facility], long term, medicaid, and disability." No s/s of current distress.

## 2022-10-31 ENCOUNTER — Encounter: Payer: Self-pay | Admitting: Psychiatry

## 2022-10-31 DIAGNOSIS — F332 Major depressive disorder, recurrent severe without psychotic features: Secondary | ICD-10-CM | POA: Diagnosis not present

## 2022-10-31 LAB — LIPID PANEL
Cholesterol: 203 mg/dL — ABNORMAL HIGH (ref 0–200)
HDL: 84 mg/dL (ref >40)
LDL Cholesterol: 80 mg/dL (ref 0–99)
Total CHOL/HDL Ratio: 2.4 {ratio}
Triglycerides: 196 mg/dL — ABNORMAL HIGH (ref <150)
VLDL: 39 mg/dL (ref 0–40)

## 2022-10-31 NOTE — Plan of Care (Signed)
  Problem: Education: Goal: Mental status will improve Outcome: Progressing   Problem: Activity: Goal: Sleeping patterns will improve Outcome: Progressing   Problem: Safety: Goal: Periods of time without injury will increase Outcome: Progressing

## 2022-10-31 NOTE — Progress Notes (Signed)
   10/30/22 2000  Psych Admission Type (Psych Patients Only)  Admission Status Voluntary  Psychosocial Assessment  Patient Complaints None  Eye Contact Fair  Facial Expression Flat  Affect Appropriate to circumstance  Speech Logical/coherent  Interaction Assertive  Motor Activity Slow  Appearance/Hygiene Unremarkable  Behavior Characteristics Cooperative;Appropriate to situation;Calm  Mood Pleasant  Thought Process  Coherency WDL  Content WDL  Delusions None reported or observed  Perception WDL  Hallucination None reported or observed  Judgment Impaired  Confusion WDL  Danger to Self  Current suicidal ideation? Denies  Danger to Others  Danger to Others None reported or observed   Patient is alert and oriented x 4, affect is flat but brightens upon approach he denies SI/HI/AVH., 15 minutes safety checks maintained,

## 2022-10-31 NOTE — Group Note (Signed)
LCSW Group Therapy Note  Group Date: 10/31/2022 Start Time: 1410 End Time: 1455   Type of Therapy and Topic:  Group Therapy - Coping Skills  Participation Level:  Did not Attend  Description of Group The focus of this group was to determine what unhealthy coping techniques typically are used by group members and what healthy coping techniques would be helpful in coping with various problems. Patients were guided in becoming aware of the differences between healthy and unhealthy coping techniques. Patients were asked to identify 2-3 healthy coping skills they would like to learn to use more effectively.  Therapeutic Goals Patients learned that coping is what human beings do all day long to deal with various situations in their lives Patients defined and discussed healthy vs unhealthy coping techniques Patients identified their preferred coping techniques and identified whether these were healthy or unhealthy Patients determined 2-3 healthy coping skills they would like to become more familiar with and use more often. Patients provided support and ideas to each other   Summary of Patient Progress:  Patient did not attend group.    Marshell Levan, LCSWA 10/31/2022  3:07 PM

## 2022-10-31 NOTE — Plan of Care (Signed)
D- Patient alert and oriented. Patient presented in a pleasant mood on assessment reporting that he slept fair last night and had complaints of generalized pain. Patient rated his pain a "5/10", stating that it's getting better, and did not request any pain medication from this Clinical research associate. Patient endorsed hopelessness, depression, and anxiety stating "my life, being on drugs for so long. I'm trying to get off of them. I wanted to use yesterday". Patient denied SI, HI, AVH at this time. Patient's goal for today is "long-term treatment contacts, apply medicaid/disability", in which he will "speak with social worker", in order to achieve his goal.  A- Scheduled medications administered to patient, per MD orders. Support and encouragement provided. Routine safety checks conducted every 15 minutes. Patient informed to notify staff with problems or concerns.  R- No adverse drug reactions noted. Patient contracts for safety at this time. Patient compliant with medications and treatment plan. Patient receptive, calm, and cooperative. Patient interacts well with others on the unit. Patient remains safe at this time.  Problem: Education: Goal: Knowledge of [facility] Education information/materials will improve Outcome: Progressing Goal: Emotional status will improve Outcome: Progressing Goal: Mental status will improve Outcome: Progressing Goal: Verbalization of understanding the information provided will improve Outcome: Progressing   Problem: Activity: Goal: Interest or engagement in activities will improve Outcome: Progressing Goal: Sleeping patterns will improve Outcome: Progressing   Problem: Coping: Goal: Ability to verbalize frustrations and anger appropriately will improve Outcome: Progressing Goal: Ability to demonstrate self-control will improve Outcome: Progressing   Problem: Health Behavior/Discharge Planning: Goal: Identification of resources available to assist in meeting  health care needs will improve Outcome: Progressing Goal: Compliance with treatment plan for underlying cause of condition will improve Outcome: Progressing   Problem: Physical Regulation: Goal: Ability to maintain clinical measurements within normal limits will improve Outcome: Progressing   Problem: Safety: Goal: Periods of time without injury will increase Outcome: Progressing

## 2022-10-31 NOTE — Group Note (Signed)
Date:  10/31/2022 Time:  10:28 AM  Group Topic/Focus:  Goals Group:   The focus of this group is to help patients establish daily goals to achieve during treatment and discuss how the patient can incorporate goal setting into their daily lives to aide in recovery. Healthy Communication:   The focus of this group is to discuss communication, barriers to communication, as well as healthy ways to communicate with others. Identifying Needs:   The focus of this group is to help patients identify their personal needs that have been historically problematic and identify healthy behaviors to address their needs.    Participation Level:  Did Not Attend   Sowmya Partridge 10/31/2022, 10:28 AM

## 2022-10-31 NOTE — Group Note (Signed)
Date:  10/31/2022 Time:  9:18 PM  Group Topic/Focus:  Wrap-Up Group:   The focus of this group is to help patients review their daily goal of treatment and discuss progress on daily workbooks.    Participation Level:  Active  Participation Quality:  Appropriate and Attentive  Affect:  Appropriate  Cognitive:  Appropriate  Insight: Appropriate and Good  Engagement in Group:  Supportive  Modes of Intervention:  Support  Additional Comments:     Belva Crome 10/31/2022, 9:18 PM

## 2022-10-31 NOTE — Group Note (Signed)
Date:  10/31/2022 Time:  5:08 PM  Group Topic/Focus:  STRUCTURED GROUP ACTIVITY  The focus of the group is to promote activity for the patients to encourage exercise to go out in the courtyard and get some exercise.     Participation Level:  Minimal  Participation Quality:  Appropriate  Affect:  Appropriate  Cognitive:  Appropriate  Insight: Limited  Engagement in Group:  Limited  Modes of Intervention:  Activity  Additional Comments:    Shaune Malacara 10/31/2022, 5:08 PM

## 2022-10-31 NOTE — Progress Notes (Signed)
[facility] MD Progress Note  10/31/2022 11:04 AM Franklin [name]  MRN:  [id number] Subjective: Franklin [name] is seen on rounds.  He states that he slept well.  Has been taking trazodone at night.  He is awaiting social work to give him some phone numbers to contact his rehab places that he is interested in.  He currently denies any suicidal ideation.  He has been compliant with medications and no problems.  He denies any side effects.  Nurses report no issues.  Lipid panel showed a cholesterol of 203 and a triglyceride of 196 otherwise within normal limits. Principal Problem: Major depressive disorder, recurrent severe without psychotic features (HCC) Diagnosis: Principal Problem:   Major depressive disorder, recurrent severe without psychotic features (HCC)  Total Time spent with patient: 15 minutes  Past Psychiatric History: Depression and polysubstance abuse  Past Medical History:  Past Medical History:  Diagnosis Date   Anxiety    Bipolar 1 disorder (HCC)    COPD (chronic obstructive pulmonary disease) (HCC)    Depression    Schizophrenia (HCC)    Substance abuse (HCC)     Past Surgical History:  Procedure Laterality Date   NO PAST SURGERIES     Family History:  Family History  Problem Relation Age of Onset   Hypertension Other    Diabetes Other    Family Psychiatric  History: Unremarkable Social History:  Social History   Substance and Sexual Activity  Alcohol Use Yes   Alcohol/week: 12.0 standard drinks of alcohol   Types: 12 Cans of beer per week     Social History   Substance and Sexual Activity  Drug Use Yes   Types: "Crack" cocaine, Cocaine   Comment: one gram daily    Social History   Socioeconomic History   Marital status: Single    Spouse name: Not on file   Number of children: Not on file   Years of education: Not on file   Highest education level: Not on file  Occupational History   Occupation: [employer]  Tobacco Use   Smoking status: Some Days    Current  packs/day: 0.50    Types: Cigarettes   Smokeless tobacco: Never  Vaping Use   Vaping status: Never Used  Substance and Sexual Activity   Alcohol use: Yes    Alcohol/week: 12.0 standard drinks of alcohol    Types: 12 Cans of beer per week   Drug use: Yes    Types: "Crack" cocaine, Cocaine    Comment: one gram daily   Sexual activity: Yes  Other Topics Concern   Not on file  Social History Narrative   The patient was born and raised in with that by both his biological parents. His father's past way but his mother still living. He denies any history of any physical or sexual abuse. He says he completed 2 years of college at [employer]. 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 He has never been married but has a 65 year old son who lives with his mother. He says he does get to see his son and has a relationship with him. He is not currently dating or in a relationship.      The patient does have a history of a DUI and had a court date last Friday for a DUI. He denies any other pending charges.            Social Determinants of Health  Financial Resource Strain: Not on file  Food Insecurity: Food Insecurity Present (10/27/2022)   Hunger Vital Sign    Worried About Running Out of Food in the Last Year: Sometimes true    Ran Out of Food in the Last Year: Sometimes true  Transportation Needs: Unmet Transportation Needs (10/27/2022)   PRAPARE - Administrator, Civil Service (Medical): Yes    Lack of Transportation (Non-Medical): Yes  Physical Activity: Not on file  Stress: Not on file  Social Connections: Unknown (10/10/2022)   Received from [employer]   Social Network    Social Network: Not on file   Additional Social History:                         Sleep: Good  Appetite:  Good  Current Medications: Current Facility-Administered Medications  Medication Dose Route  Frequency Provider Last Rate Last Admin   acetaminophen (TYLENOL) tablet 650 mg  650 mg Oral Q6H PRN Charm Rings, NP       albuterol (VENTOLIN HFA) 108 (90 Base) MCG/ACT inhaler 2 puff  2 puff Inhalation Q6H PRN Charm Rings, NP   2 puff at 10/29/22 1825   alum & mag hydroxide-simeth (MAALOX/MYLANTA) 200-200-20 MG/5ML suspension 30 mL  30 mL Oral Q4H PRN Charm Rings, NP       carvedilol (COREG) tablet 3.125 mg  3.125 mg Oral BID WC Charm Rings, NP   3.125 mg at 10/31/22 0827   diazepam (VALIUM) tablet 2 mg  2 mg Oral Q12H Sarina Ill, DO   2 mg at 10/30/22 2300   FLUoxetine (PROZAC) capsule 20 mg  20 mg Oral Daily Sarina Ill, DO   20 mg at 10/31/22 0827   fluticasone furoate-vilanterol (BREO ELLIPTA) 200-25 MCG/ACT 1 puff  1 puff Inhalation Daily Charm Rings, NP   1 puff at 10/31/22 8657   folic acid (FOLVITE) tablet 1 mg  1 mg Oral Daily Charm Rings, NP   1 mg at 10/31/22 0827   hydrOXYzine (ATARAX) tablet 25 mg  25 mg Oral TID PRN Charm Rings, NP   25 mg at 10/30/22 0041   magnesium hydroxide (MILK OF MAGNESIA) suspension 30 mL  30 mL Oral Daily PRN Charm Rings, NP       OLANZapine (ZYPREXA) tablet 10 mg  10 mg Oral BID PRN Charm Rings, NP       Or   OLANZapine (ZYPREXA) injection 10 mg  10 mg Intramuscular BID PRN Charm Rings, NP       risperiDONE (RISPERDAL) tablet 0.5 mg  0.5 mg Oral [id number] Sarina Ill, DO   0.5 mg at 10/31/22 0827   thiamine (VITAMIN B1) tablet 100 mg  100 mg Oral Daily Charm Rings, NP   100 mg at 10/31/22 0827   traZODone (DESYREL) tablet 100 mg  100 mg Oral QHS Sarina Ill, DO   100 mg at 10/30/22 2200    Lab Results:  Results for orders placed or performed during the hospital encounter of 10/27/22 (from the past 48 hour(s))  Lipid panel     Status: Abnormal   Collection Time: 10/31/22  9:25 AM  Result Value Ref Range   Cholesterol 203 (H) 0 - 200 mg/dL   Triglycerides 846  (H) <150 mg/dL   HDL 84 >96 mg/dL   Total CHOL/HDL Ratio 2.4 RATIO   VLDL 39 0 -  40 mg/dL   LDL Cholesterol 80 0 - 99 mg/dL    Comment:        Total Cholesterol/HDL:CHD Risk Coronary Heart Disease Risk Table                     Men   Women  1/2 Average Risk   3.4   3.3  Average Risk       5.0   4.4  2 X Average Risk   9.6   7.1  3 X Average Risk  23.4   11.0        Use the calculated Patient Ratio above and the CHD Risk Table to determine the patient's CHD Risk.        ATP III CLASSIFICATION (LDL):  <100     mg/dL   Optimal  161-096  mg/dL   Near or Above                    Optimal  130-159  mg/dL   Borderline  045-409  mg/dL   High  >811     mg/dL   Very High Performed at [facility], [address] [place], [place] [address]     Blood Alcohol level:  Lab Results  Component Value Date   [facility] <10 10/26/2022   ETH <10 05/28/2022    Metabolic Disorder Labs: Lab Results  Component Value Date   HGBA1C 5.9 (A) 07/12/2022   MPG 117 02/02/2009   No results found for: "PROLACTIN" Lab Results  Component Value Date   CHOL 203 (H) 10/31/2022   TRIG 196 (H) 10/31/2022   HDL 84 10/31/2022   CHOLHDL 2.4 10/31/2022   VLDL 39 10/31/2022   LDLCALC 80 10/31/2022   LDLCALC 63 10/08/2014    Physical Findings: AIMS:  , ,  ,  ,    CIWA:    COWS:     Musculoskeletal: Strength & Muscle Tone: within normal limits Gait & Station: normal Patient leans: N/A  Psychiatric Specialty Exam:  Presentation  General Appearance:  Appropriate for Environment; Casual  Eye Contact: Fair  Speech: Clear and Coherent  Speech Volume: Decreased  Handedness: Right   Mood and Affect  Mood: Anxious; Depressed; Hopeless; Worthless  Affect: Government social research officer; Tearful   Thought Process  Thought Processes: Coherent  Descriptions of Associations:Intact  Orientation:Full (Time, Place and Person)  Thought Content:Illogical; WDL  History of  Schizophrenia/Schizoaffective disorder:Yes  Duration of Psychotic Symptoms:Greater than six months  Hallucinations:No data recorded Ideas of Reference:None  Suicidal Thoughts:No data recorded Homicidal Thoughts:No data recorded  Sensorium  Memory: Immediate Fair; Remote Fair  Judgment: Impaired  Insight: Fair   Chartered certified accountant: Fair  Attention Span: Fair  Recall: [employer] of Knowledge: Fair  Language: Fair   Psychomotor Activity  Psychomotor Activity:No data recorded  Assets  Assets: Communication Skills; Desire for Improvement; Resilience   Sleep  Sleep:No data recorded    Blood pressure 120/67, pulse 86, temperature 98.3 F (36.8 C), resp. rate 19, height 5\' 10"  (1.778 m), weight 68.9 kg, SpO2 99%. Body mass index is 21.81 kg/m.   Treatment Plan Summary: Daily contact with patient to assess and evaluate symptoms and progress in treatment, Medication management, and Plan continue current medications.  Sarina Ill, DO 10/31/2022, 11:04 AM

## 2022-11-01 ENCOUNTER — Other Ambulatory Visit: Payer: Self-pay

## 2022-11-01 DIAGNOSIS — F332 Major depressive disorder, recurrent severe without psychotic features: Secondary | ICD-10-CM | POA: Diagnosis not present

## 2022-11-01 MED ORDER — TRAZODONE HCL 100 MG PO TABS
100.0000 mg | ORAL_TABLET | Freq: Every day | ORAL | 3 refills | Status: DC
  Filled 2022-11-01: qty 30, 30d supply, fill #0

## 2022-11-01 MED ORDER — CARVEDILOL 3.125 MG PO TABS
3.1250 mg | ORAL_TABLET | Freq: Two times a day (BID) | ORAL | 1 refills | Status: DC
  Filled 2022-11-01: qty 60, 30d supply, fill #0

## 2022-11-01 MED ORDER — FLUTICASONE-SALMETEROL 250-50 MCG/ACT IN AEPB
1.0000 | INHALATION_SPRAY | Freq: Two times a day (BID) | RESPIRATORY_TRACT | 1 refills | Status: DC
  Filled 2022-11-01: qty 60, 30d supply, fill #0

## 2022-11-01 MED ORDER — FLUOXETINE HCL 20 MG PO CAPS
20.0000 mg | ORAL_CAPSULE | Freq: Every day | ORAL | 3 refills | Status: DC
  Filled 2022-11-01: qty 30, 30d supply, fill #0

## 2022-11-01 MED ORDER — RISPERIDONE 0.5 MG PO TABS
0.5000 mg | ORAL_TABLET | ORAL | 3 refills | Status: DC
  Filled 2022-11-01: qty 60, 30d supply, fill #0

## 2022-11-01 MED ORDER — ALBUTEROL SULFATE HFA 108 (90 BASE) MCG/ACT IN AERS
2.0000 | INHALATION_SPRAY | Freq: Four times a day (QID) | RESPIRATORY_TRACT | 2 refills | Status: DC | PRN
  Filled 2022-11-01: qty 6.7, 25d supply, fill #0

## 2022-11-01 NOTE — Progress Notes (Signed)
   10/31/22 2000  Psych Admission Type (Psych Patients Only)  Admission Status Voluntary  Psychosocial Assessment  Eye Contact Fair  Facial Expression Flat  Affect Appropriate to circumstance  Speech Logical/coherent  Interaction Assertive  Motor Activity Slow  Appearance/Hygiene Unremarkable  Behavior Characteristics Cooperative;Appropriate to situation  Mood Pleasant  Thought Process  Coherency WDL  Content WDL  Delusions None reported or observed  Perception WDL  Hallucination None reported or observed  Judgment Impaired  Confusion WDL  Danger to Self  Current suicidal ideation? Denies  Danger to Others  Danger to Others None reported or observed   Patient alert and oriented x 4, affect is flat brightens upon approach denies SI/HI/AVH will continue to monitor.

## 2022-11-01 NOTE — Group Note (Signed)
Recreation Therapy Group Note   Group Topic:Goal Setting  Group Date: 11/01/2022 Start Time: 1015 End Time: 1115 Facilitators: Clinton Gallant, CTRS Location:  Craft Room  Group Description: Vision Boards. Patients were given many different magazines, a glue stick, markers, and a piece of cardstock paper. LRT and pts discussed the importance of having goals in life. LRT and pts discussed the difference between short-term and long-term goals, as well as what a SMART goal is. LRT encouraged pts to create a vision board, with images they picked and then cut out with safety scissors from the magazine, for themselves, that capture their short and long-term goals. LRT encouraged pts to show and explain their vision board to the group.   Goal Area(s) Addressed:  Patient will gain knowledge of short vs. long term goals.  Patient will identify goals for themselves. Patient will practice setting SMART goals. Patient will verbalize their goals to LRT and peers.   Affect/Mood: N/A   Participation Level: Did not attend    Clinical Observations/Individualized Feedback: Franklin [name] did not attend group.  Plan: Continue to engage patient in RT group sessions 2-3x/week.   Franklin [name], LRT, CTRS 11/01/2022 11:39 AM

## 2022-11-01 NOTE — Group Note (Signed)
Date:  11/01/2022 Time:  9:53 AM  Group Topic/Focus:  Goals Group:   The focus of this group is to help patients establish daily goals to achieve during treatment and discuss how the patient can incorporate goal setting into their daily lives to aide in recovery.    Participation Level:  Did Not Attend   Lynelle Smoke [facility] 11/01/2022, 9:53 AM

## 2022-11-01 NOTE — BHH Suicide Risk Assessment (Signed)
[facility] Discharge Suicide Risk Assessment   Principal Problem: Major depressive disorder, recurrent severe without psychotic features [facility]) Discharge Diagnoses: Principal Problem:   Major depressive disorder, recurrent severe without psychotic features (HCC)   Total Time spent with patient: 1 hour  Musculoskeletal: Strength & Muscle Tone: within normal limits Gait & Station: normal Patient leans: N/A  Psychiatric Specialty Exam  Presentation  General Appearance:  Appropriate for Environment; Casual  Eye Contact: Fair  Speech: Clear and Coherent  Speech Volume: Decreased  Handedness: Right   Mood and Affect  Mood: Anxious; Depressed; Hopeless; Worthless  Duration of Depression Symptoms: Less than two weeks  Affect: Congruent; Flat; Tearful   Thought Process  Thought Processes: Coherent  Descriptions of Associations:Intact  Orientation:Full (Time, Place and Person)  Thought Content:Illogical; WDL  History of Schizophrenia/Schizoaffective disorder:Yes  Duration of Psychotic Symptoms:Greater than six months  Hallucinations:No data recorded Ideas of Reference:None  Suicidal Thoughts:No data recorded Homicidal Thoughts:No data recorded  Sensorium  Memory: Immediate Fair; Remote Fair  Judgment: Impaired  Insight: Fair   Chartered certified accountant: Fair  Attention Span: Fair  Recall: [employer] of Knowledge: Fair  Language: Fair   Psychomotor Activity  Psychomotor Activity:No data recorded  Assets  Assets: Communication Skills; Desire for Improvement; Resilience   Sleep  Sleep:No data recorded   Blood pressure 138/71, pulse 84, temperature 98.1 F (36.7 C), temperature source Oral, resp. rate 16, height 5\' 10"  (1.778 m), weight 68.9 kg, SpO2 99%. Body mass index is 21.81 kg/m.  Mental Status Per Nursing Assessment::   On Admission:  NA  Demographic Factors:  Male  Loss Factors: NA  Historical  Factors: Impulsivity  Risk Reduction Factors:   NA  Continued Clinical Symptoms:  Alcohol/Substance Abuse/Dependencies  Cognitive Features That Contribute To Risk:  Closed-mindedness    Suicide Risk:  Minimal: No identifiable suicidal ideation.  Patients presenting with no risk factors but with morbid ruminations; may be classified as minimal risk based on the severity of the depressive symptoms    Plan Of Care/Follow-up recommendations: See SW Note   Sarina Ill, DO 11/01/2022, 10:53 AM

## 2022-11-01 NOTE — Progress Notes (Signed)
Patient is discharging at this time. Patient is A&Ox4. Stable. Patient denies SI,HI, and A/V/H with no plan/intent. Printed AVS reviewed with and given to patient along with medications and follow up appointments. Suicide safety plan complete with copy provided to patient. Original form in chart. Patient verbalized all understanding. All valuables/belongings returned to patient. Patient is being transported by his sister. Patient denies any pain/discomfort. No s/s of current distress.

## 2022-11-01 NOTE — BHH Counselor (Signed)
CSW met with pt briefly to discuss discharge. Pt planning to discharge to his sister. He stated that his sister is willing to pick him up with his discharge paperwork (to address upcoming court issue) so that pt can go to [facility]. He was agreeable to receiving information for [facility] if he does not gain admission to [facility] immediately. Walk-in hours were added to discharge paperwork as pt is uncertain of where he will go as he is waiting for admission to [facility]. CSW also explained the process for walk-in hours to pt. Pt denied any use of nicotine or need for cessation services despite it being noted in the chart that he does smoke. He endorsed need for substance use services but endorsed plans to go to [facility] for admission. No other concerns expressed. Contact ended without incident.   Vilma Meckel. Algis Greenhouse, MSW, LCSW, LCAS 11/01/2022 11:48 AM

## 2022-11-01 NOTE — Discharge Summary (Signed)
Physician Discharge Summary Note  Patient:  Franklin [name] is an 52 y.o., male MRN:  [id number] DOB:  [birth year] Patient phone:  [phone] (home)  Patient address:   [address] [place] [address],  Total Time spent with patient: 1 hour  Date of Admission:  10/27/2022 Date of Discharge: 11/01/2022  Reason for Admission:  Franklin [name] is a 52 year old African-American male who is voluntarily admitted to inpatient psychiatry for recent substance abuse and depression along with suicidal ideation. Franklin [name] has had multiple hospitalizations for substance induced mood disorder the last time was in 2018. Both his parents are deceased and he is homeless. His son passed away 3 years ago after being shot and his mom recently passed away a few months ago so he is homeless because he was living with her. He endorses anhedonia, difficulty sleeping, anxiety, depressed mood, hopelessness and helplessness. He would like to go to a long-term treatment program. He is from [employer]. He has a history of COPD and hypertension. He is currently not on any psychiatric medications. He has been using crack cocaine and alcohol.   Principal Problem: Major depressive disorder, recurrent severe without psychotic features [facility]) Discharge Diagnoses: Principal Problem:   Major depressive disorder, recurrent severe without psychotic features (HCC)   Past Psychiatric History: Depression and alcohol abuse  Past Medical History:  Past Medical History:  Diagnosis Date   Anxiety    Bipolar 1 disorder (HCC)    COPD (chronic obstructive pulmonary disease) (HCC)    Depression    Schizophrenia (HCC)    Substance abuse (HCC)     Past Surgical History:  Procedure Laterality Date   NO PAST SURGERIES     Family History:  Family History  Problem Relation Age of Onset   Hypertension Other    Diabetes Other    Family Psychiatric  History: Unremarkable Social History:  Social History   Substance and Sexual  Activity  Alcohol Use Yes   Alcohol/week: 12.0 standard drinks of alcohol   Types: 12 Cans of beer per week     Social History   Substance and Sexual Activity  Drug Use Yes   Types: "Crack" cocaine, Cocaine   Comment: one gram daily    Social History   Socioeconomic History   Marital status: Single    Spouse name: Not on file   Number of children: Not on file   Years of education: Not on file   Highest education level: Not on file  Occupational History   Occupation: [employer]  Tobacco Use   Smoking status: Some Days    Current packs/day: 0.50    Types: Cigarettes   Smokeless tobacco: Never  Vaping Use   Vaping status: Never Used  Substance and Sexual Activity   Alcohol use: Yes    Alcohol/week: 12.0 standard drinks of alcohol    Types: 12 Cans of beer per week   Drug use: Yes    Types: "Crack" cocaine, Cocaine    Comment: one gram daily   Sexual activity: Yes  Other Topics Concern   Not on file  Social History Narrative   The patient was born and raised in with that by both his biological parents. His father's past way but his mother still living. He denies any history of any physical or sexual abuse. He says he completed 2 years of college at [employer]. 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 He has  never been married but has a 73 year old son who lives with his mother. He says he does get to see his son and has a relationship with him. He is not currently dating or in a relationship.      The patient does have a history of a DUI and had a court date last Friday for a DUI. He denies any other pending charges.            Social Determinants of Health   Financial Resource Strain: Not on file  Food Insecurity: Food Insecurity Present (10/27/2022)   Hunger Vital Sign    Worried About Running Out of Food in the Last Year: Sometimes true    Ran Out of Food in the Last Year: Sometimes true   Transportation Needs: Unmet Transportation Needs (10/27/2022)   PRAPARE - Administrator, Civil Service (Medical): Yes    Lack of Transportation (Non-Medical): Yes  Physical Activity: Not on file  Stress: Not on file  Social Connections: Unknown (10/10/2022)   Received from [facility]   Social Network    Social Network: Not on file    Hospital Course: Franklin [name] is a 52 year old African-American male who was voluntarily admitted to inpatient psychiatry for detox and depression.  He was successfully detoxed after starting a detox protocol.  He was placed on Risperdal Prozac and trazodone for depression.  His mood and affect improved.  He wanted to go to rehab but then told me that he has a court date would like to be discharged to the care of his sister.  He states that he is going to go to [place] after his court date.  It was felt that he maximize hospitalization he was discharged home.  On the day of discharge she denied suicidal ideation, homicidal ideation, auditory or visual hallucinations.  Judgment and insight were good.  Physical Findings: AIMS:  , ,  ,  ,    CIWA:    COWS:     Musculoskeletal: Strength & Muscle Tone: within normal limits Gait & Station: normal Patient leans: N/A   Psychiatric Specialty Exam:  Presentation  General Appearance:  Appropriate for Environment; Casual  Eye Contact: Fair  Speech: Clear and Coherent  Speech Volume: Decreased  Handedness: Right   Mood and Affect  Mood: Anxious; Depressed; Hopeless; Worthless  Affect: Government social research officer; Tearful   Thought Process  Thought Processes: Coherent  Descriptions of Associations:Intact  Orientation:Full (Time, Place and Person)  Thought Content:Illogical; WDL  History of Schizophrenia/Schizoaffective disorder:Yes  Duration of Psychotic Symptoms:Greater than six months  Hallucinations:No data recorded Ideas of Reference:None  Suicidal Thoughts:No data  recorded Homicidal Thoughts:No data recorded  Sensorium  Memory: Immediate Fair; Remote Fair  Judgment: Impaired  Insight: Fair   Chartered certified accountant: Fair  Attention Span: Fair  Recall: [employer] of Knowledge: Fair  Language: Fair   Psychomotor Activity  Psychomotor Activity:No data recorded  Assets  Assets: Communication Skills; Desire for Improvement; Resilience   Sleep  Sleep:No data recorded   Physical Exam: Physical Exam Vitals and nursing note reviewed.  Constitutional:      Appearance: Normal appearance. He is normal weight.  Neurological:     General: No focal deficit present.     Mental Status: He is alert and oriented to person, place, and time.  Psychiatric:        Attention and Perception: Attention and perception normal.        Mood and Affect: Mood and affect normal.  Speech: Speech normal.        Behavior: Behavior normal. Behavior is cooperative.        Thought Content: Thought content normal.        Cognition and Memory: Cognition and memory normal.        Judgment: Judgment normal.    Review of Systems  Constitutional: Negative.   HENT: Negative.    Eyes: Negative.   Respiratory: Negative.    Cardiovascular: Negative.   Gastrointestinal: Negative.   Genitourinary: Negative.   Musculoskeletal: Negative.   Skin: Negative.   Neurological: Negative.   Endo/Heme/Allergies: Negative.   Psychiatric/Behavioral: Negative.     Blood pressure 138/71, pulse 84, temperature 98.1 F (36.7 C), temperature source Oral, resp. rate 16, height 5\' 10"  (1.778 m), weight 68.9 kg, SpO2 99%. Body mass index is 21.81 kg/m.   Social History   Tobacco Use  Smoking Status Some Days   Current packs/day: 0.50   Types: Cigarettes  Smokeless Tobacco Never   Tobacco Cessation:  A prescription for an FDA-approved tobacco cessation medication was offered at discharge and the patient refused   Blood Alcohol level:  Lab  Results  Component Value Date   [facility] <10 10/26/2022   ETH <10 05/28/2022    Metabolic Disorder Labs:  Lab Results  Component Value Date   HGBA1C 5.9 (A) 07/12/2022   MPG 117 02/02/2009   No results found for: "PROLACTIN" Lab Results  Component Value Date   CHOL 203 (H) 10/31/2022   TRIG 196 (H) 10/31/2022   HDL 84 10/31/2022   CHOLHDL 2.4 10/31/2022   VLDL 39 10/31/2022   LDLCALC 80 10/31/2022   LDLCALC 63 10/08/2014    See Psychiatric Specialty Exam and Suicide Risk Assessment completed by Attending Physician prior to discharge.  Discharge destination:  Home  Is patient on multiple antipsychotic therapies at discharge:  No   Has Patient had three or more failed trials of antipsychotic monotherapy by history:  No  Recommended Plan for Multiple Antipsychotic Therapies: NA   Allergies as of 11/01/2022   No Known Allergies      Medication List     STOP taking these medications    azithromycin 250 MG tablet Commonly known as: ZITHROMAX   folic acid 1 MG tablet Commonly known as: FOLVITE   guaiFENesin 600 MG 12 hr tablet Commonly known as: MUCINEX   multivitamin with minerals Tabs tablet   predniSONE 20 MG tablet Commonly known as: DELTASONE   thiamine 100 MG tablet Commonly known as: Vitamin B-1       TAKE these medications      Indication  albuterol 108 (90 Base) MCG/ACT inhaler Commonly known as: VENTOLIN HFA Inhale 2 puffs into the lungs every 6 (six) hours as needed for wheezing or shortness of breath.  Indication: Chronic Obstructive Lung Disease   carvedilol 3.125 MG tablet Commonly known as: COREG Take 1 tablet (3.125 mg total) by mouth 2 (two) times daily with a meal.  Indication: High Blood Pressure   FLUoxetine 20 MG capsule Commonly known as: PROZAC Take 1 capsule (20 mg total) by mouth daily. Start taking on: November 02, 2022  Indication: Abuse or Misuse of Alcohol, Depression   fluticasone-salmeterol 250-50 MCG/ACT  Aepb Commonly known as: Advair Diskus Inhale 1 puff into the lungs in the morning and at bedtime.  Indication: Chronic Obstructive Lung Disease   risperiDONE 0.5 MG tablet Commonly known as: RISPERDAL Take 1 tablet (0.5 mg total) by mouth 2 (two) times daily  at 8 am and 4 pm.  Indication: Major Depressive Disorder   traZODone 100 MG tablet Commonly known as: DESYREL Take 1 tablet (100 mg total) by mouth at bedtime.  Indication: Abuse or Misuse of Alcohol, Trouble Sleeping, Major Depressive Disorder        Follow-up Information     [facility]. Go to.   Specialty: Behavioral Health Why: They offer walk-in appointments for therapy on Monday, Wednesday, and Thursday at 8am. It is recommended that on the day you plan to walk-in, arrive prior to 7:30am, sign in, and they will begin seeing people at 8am. Contact information: [address] [place] [address] [phone]                Follow-up recommendations:  [facility] and Behavioral Health    Signed: Sarina Ill, DO 11/01/2022, 11:08 AM

## 2022-11-01 NOTE — Plan of Care (Signed)
  Problem: Activity: Goal: Interest or engagement in activities will improve Outcome: Progressing   Problem: Education: Goal: Verbalization of understanding the information provided will improve Outcome: Progressing   Problem: Education: Goal: Mental status will improve Outcome: Progressing

## 2022-11-01 NOTE — Progress Notes (Signed)
  [facility] Adult Case Management Discharge Plan :  Will you be returning to the same living situation after discharge:  No. At discharge, do you have transportation home?: Yes,  sister to provide transportation. Do you have the ability to pay for your medications: No.  Release of information consent forms completed and in the chart;  Patient's signature needed at discharge.  Patient to Follow up at:  Follow-up Information     [facility]. Go to.   Specialty: Behavioral Health Why: They offer walk-in appointments for therapy on Monday, Wednesday, and Thursday at 8am. It is recommended that on the day you plan to walk-in, arrive prior to 7:30am, sign in, and they will begin seeing people at 8am. Contact information: [address] [place] [address] [phone]                Next level of care provider has access to [facility] Link:no  Safety Planning and Suicide Prevention discussed: Yes,  SPE completed with pt.     Has patient been referred to the Quitline?: Patient refused referral for treatment  Patient has been referred for addiction treatment: Yes, the patient will follow up with an outpatient provider for substance use disorder. Psychiatrist/APP: patient to schedule appointment and Therapist: patient to schedule appointment  Glenis Smoker, LCSW 11/01/2022, 11:36 AM

## 2022-11-07 ENCOUNTER — Emergency Department
Admission: EM | Admit: 2022-11-07 | Discharge: 2022-11-07 | Disposition: A | Discharge status: Home / Self Care | Payer: MEDICAID | Attending: Emergency Medicine | Admitting: Emergency Medicine

## 2022-11-07 ENCOUNTER — Emergency Department: Payer: MEDICAID

## 2022-11-07 DIAGNOSIS — S0990XA Unspecified injury of head, initial encounter: Secondary | ICD-10-CM | POA: Diagnosis present

## 2022-11-07 DIAGNOSIS — J449 Chronic obstructive pulmonary disease, unspecified: Secondary | ICD-10-CM | POA: Diagnosis not present

## 2022-11-07 DIAGNOSIS — I10 Essential (primary) hypertension: Secondary | ICD-10-CM | POA: Diagnosis not present

## 2022-11-07 DIAGNOSIS — F19122 Other psychoactive substance abuse with intoxication with perceptual disturbances: Secondary | ICD-10-CM | POA: Diagnosis not present

## 2022-11-07 DIAGNOSIS — W108XXA Fall (on) (from) other stairs and steps, initial encounter: Secondary | ICD-10-CM | POA: Diagnosis not present

## 2022-11-07 DIAGNOSIS — F19922 Other psychoactive substance use, unspecified with intoxication with perceptual disturbance: Secondary | ICD-10-CM

## 2022-11-07 LAB — COMPREHENSIVE METABOLIC PANEL
ALT: 21 U/L (ref 0–44)
AST: 24 U/L (ref 15–41)
Albumin: 3.5 g/dL (ref 3.5–5.0)
Alkaline Phosphatase: 65 U/L (ref 38–126)
Anion gap: 8 (ref 5–15)
BUN: 18 mg/dL (ref 6–20)
CO2: 22 mmol/L (ref 22–32)
Calcium: 8.5 mg/dL — ABNORMAL LOW (ref 8.9–10.3)
Chloride: 107 mmol/L (ref 98–111)
Creatinine, Ser: 0.93 mg/dL (ref 0.61–1.24)
GFR, Estimated: >60 mL/min (ref >60)
Glucose, Bld: 147 mg/dL — ABNORMAL HIGH (ref 70–99)
Potassium: 3.7 mmol/L (ref 3.5–5.1)
Sodium: 137 mmol/L (ref 135–145)
Total Bilirubin: 1 mg/dL (ref 0.3–1.2)
Total Protein: 6.5 g/dL (ref 6.5–8.1)

## 2022-11-07 LAB — CBC WITH DIFFERENTIAL/PLATELET
Abs Immature Granulocytes: 0.04 10*3/uL (ref 0.00–0.07)
Basophils Absolute: 0.1 10*3/uL (ref 0.0–0.1)
Basophils Relative: 1 %
Eosinophils Absolute: 0.2 10*3/uL (ref 0.0–0.5)
Eosinophils Relative: 2 %
HCT: 35.3 % — ABNORMAL LOW (ref 39.0–52.0)
Hemoglobin: 11.8 g/dL — ABNORMAL LOW (ref 13.0–17.0)
Immature Granulocytes: 0 %
Lymphocytes Relative: 7 %
Lymphs Abs: 0.8 10*3/uL (ref 0.7–4.0)
MCH: 30.2 pg (ref 26.0–34.0)
MCHC: 33.4 g/dL (ref 30.0–36.0)
MCV: 90.3 fL (ref 80.0–100.0)
Monocytes Absolute: 0.6 10*3/uL (ref 0.1–1.0)
Monocytes Relative: 6 %
Neutro Abs: 9.1 10*3/uL — ABNORMAL HIGH (ref 1.7–7.7)
Neutrophils Relative %: 84 %
Platelets: 254 10*3/uL (ref 150–400)
RBC: 3.91 MIL/uL — ABNORMAL LOW (ref 4.22–5.81)
RDW: 14.9 % (ref 11.5–15.5)
WBC: 10.9 10*3/uL — ABNORMAL HIGH (ref 4.0–10.5)
nRBC: 0 % (ref 0.0–0.2)

## 2022-11-07 LAB — SALICYLATE LEVEL: Salicylate Lvl: <7 mg/dL — ABNORMAL LOW (ref 7.0–30.0)

## 2022-11-07 LAB — ACETAMINOPHEN LEVEL: Acetaminophen (Tylenol), Serum: <10 ug/mL — ABNORMAL LOW (ref 10–30)

## 2022-11-07 LAB — ETHANOL: Alcohol, Ethyl (B): <10 mg/dL (ref <10)

## 2022-11-07 MED ORDER — MIDAZOLAM HCL (PF) 10 MG/2ML IJ SOLN
INTRAMUSCULAR | Status: AC
  Filled 2022-11-07: qty 2

## 2022-11-07 MED ORDER — MIDAZOLAM 5 MG/ML ADULT INJ FOR INTRANASAL USE (MC USE ONLY)
5.0000 mg | Freq: Once | INTRAMUSCULAR | Status: AC
  Administered 2022-11-07: 5 mg via NASAL

## 2022-11-07 MED ORDER — DIAZEPAM 5 MG/ML IJ SOLN
10.0000 mg | Freq: Once | INTRAMUSCULAR | Status: DC
  Filled 2022-11-07: qty 2

## 2022-11-07 MED ORDER — SODIUM CHLORIDE 0.9 % IV BOLUS
1000.0000 mL | Freq: Once | INTRAVENOUS | Status: AC
  Administered 2022-11-07: 1000 mL via INTRAVENOUS

## 2022-11-07 MED ORDER — PANTOPRAZOLE SODIUM 40 MG IV SOLR
40.0000 mg | Freq: Once | INTRAVENOUS | Status: AC
  Administered 2022-11-07: 40 mg via INTRAVENOUS
  Filled 2022-11-07: qty 10

## 2022-11-07 MED ORDER — DIAZEPAM 5 MG/ML IJ SOLN
5.0000 mg | Freq: Once | INTRAMUSCULAR | Status: AC
  Administered 2022-11-07: 5 mg via INTRAVENOUS

## 2022-11-07 MED ORDER — ONDANSETRON HCL 4 MG/2ML IJ SOLN
4.0000 mg | Freq: Once | INTRAMUSCULAR | Status: AC
  Administered 2022-11-07: 4 mg via INTRAVENOUS
  Filled 2022-11-07: qty 2

## 2022-11-07 NOTE — ED Notes (Signed)
Patient sleeping no acute distress noted, respirations even and unlabored

## 2022-11-07 NOTE — ED Notes (Signed)
Bed alarm on.

## 2022-11-07 NOTE — ED Notes (Signed)
Sister at bedside.

## 2022-11-07 NOTE — ED Notes (Signed)
Patient able to eat and drink without nausea or vomiting.

## 2022-11-07 NOTE — ED Provider Notes (Signed)
[facility] Provider Note    Event Date/Time   First MD Initiated Contact with Patient 11/07/22 1714     (approximate)   History   Chief Complaint: Seizures   HPI  Franklin [name] is a 51 y.o. male with a history of bipolar disorder, cocaine abuse, alcohol abuse, COPD who is brought to the ED due to altered mental status, concern for seizures.  EMS note that patient was agitated, requiring IM Versed during transport.  He was suspected to have fallen down 3 stairs.    Recent psychiatry DC summary: Date of Admission:  10/27/2022 Date of Discharge: 11/01/2022   Reason for Admission:  Jeannett Senior is a 52 year old African-American male who is voluntarily admitted to inpatient psychiatry for recent substance abuse and depression along with suicidal ideation. Jeannett Senior has had multiple hospitalizations for substance induced mood disorder the last time was in 2018. Both his parents are deceased and he is homeless. His son passed away 3 years ago after being shot and his mom recently passed away a few months ago so he is homeless because he was living with her. He endorses anhedonia, difficulty sleeping, anxiety, depressed mood, hopelessness and helplessness. He would like to go to a long-term treatment program. He is from [place]. He has a history of COPD and hypertension. He is currently not on any psychiatric medications. He has been using crack cocaine and alcohol.    Principal Problem: Major depressive disorder, recurrent severe without psychotic features [facility]) Discharge Diagnoses: Principal Problem:   Major depressive disorder, recurrent severe without psychotic features (HCC)     Past Psychiatric History: Depression and alcohol abuse   Past Medical History:      Past Medical History:  Diagnosis Date   Anxiety     Bipolar 1 disorder (HCC)     COPD (chronic obstructive pulmonary disease) (HCC)     Depression     Schizophrenia (HCC)     Substance abuse (HCC)            Physical Exam   Triage Vital Signs: ED Triage Vitals  Encounter Vitals Group     BP --      Systolic BP Percentile --      Diastolic BP Percentile --      Pulse Rate 11/07/22 1724 84     Resp 11/07/22 1724 18     Temp --      Temp src --      SpO2 11/07/22 1721 98 %     Weight 11/07/22 1725 154 lb 15.7 oz (70.3 kg)     Height 11/07/22 1725 5\' 10"  (1.778 m)     Head Circumference --      Peak Flow --      Pain Score --      Pain Loc --      Pain Education --      Exclude from Growth Chart --     Most recent vital signs: Vitals:   11/07/22 2230 11/07/22 2248  BP: (!) 100/56   Pulse: (!) 58   Resp: 13   Temp:  98.1 F (36.7 C)  SpO2: 100%     General: Stuporous.  Not in distress. CV:  Good peripheral perfusion.  Regular rate and rhythm, normal distal pulses Resp:  Normal effort.  Clear to auscultation bilaterally Abd:  No distention.  Soft nontender Other:  No signs of head trauma.  Intact extraocular movements.  No facial asymmetry.  Moves all extremities in somewhat  irregular ways, but purposeful.   ED Results / Procedures / Treatments   Labs (all labs ordered are listed, but only abnormal results are displayed) Labs Reviewed  ACETAMINOPHEN LEVEL - Abnormal; Notable for the following components:      Result Value   Acetaminophen (Tylenol), Serum <10 (*)    All other components within normal limits  COMPREHENSIVE METABOLIC PANEL - Abnormal; Notable for the following components:   Glucose, Bld 147 (*)    Calcium 8.5 (*)    All other components within normal limits  SALICYLATE LEVEL - Abnormal; Notable for the following components:   Salicylate Lvl <7.0 (*)    All other components within normal limits  CBC WITH DIFFERENTIAL/PLATELET - Abnormal; Notable for the following components:   WBC 10.9 (*)    RBC 3.91 (*)    Hemoglobin 11.8 (*)    HCT 35.3 (*)    Neutro Abs 9.1 (*)    All other components within normal limits  ETHANOL  URINE DRUG  SCREEN, QUALITATIVE ([facility] ONLY)     EKG Interpreted by me Sinus rhythm, rate of 85.  Normal axis, normal intervals.  Normal QRS ST segments T waves.   RADIOLOGY CT head interpreted by me, negative for intracranial hemorrhage.  Radiology report reviewed.  CT cervical spine unremarkable   PROCEDURES:  Procedures   MEDICATIONS ORDERED IN ED: Medications  midazolam (VERSED) 5 mg/ml ADULT INJ for INTRANASAL Use ([facility] Use ONLY) (5 mg Nasal Given 11/07/22 1718)  sodium chloride 0.9 % bolus 1,000 mL (0 mLs Intravenous Stopped 11/07/22 2004)  ondansetron (ZOFRAN) injection 4 mg (4 mg Intravenous Given 11/07/22 1745)  pantoprazole (PROTONIX) injection 40 mg (40 mg Intravenous Given 11/07/22 1745)  diazepam (VALIUM) injection 5 mg (5 mg Intravenous Given 11/07/22 1745)     IMPRESSION / MDM / ASSESSMENT AND PLAN / ED COURSE  I reviewed the triage vital signs and the nursing notes.  DDx: Alcohol intoxication, cocaine intoxication, electrolyte abnormality, intracranial hemorrhage, C-spine fracture  Patient's presentation is most consistent with acute presentation with potential threat to life or bodily function.  Patient presents with altered mental status, suspicious for cocaine intoxication.  Vital signs unremarkable.  Patient given intranasal Versed and now call.  Check labs, CT head and cervical spine for trauma evaluation.  ----------------------------------------- 11:14 PM on 11/07/2022 ----------------------------------------- Patient now lucid, normal mental status.  Vital signs normal.  No abnormal muscular movements.  Denies pain or other complaints.  EKG about possible recent cocaine use.  Suspect presentation was due to intoxication, and patient is stable for discharge at this point.  He is clinically sober.       FINAL CLINICAL IMPRESSION(S) / ED DIAGNOSES   Final diagnoses:  Intoxication by drug, with perceptual disturbance (HCC)     Rx / DC Orders   ED Discharge  Orders     None        Note:  This document was prepared using Dragon voice recognition software and may include unintentional dictation errors.   Sharman Cheek, MD 11/07/22 2314

## 2022-11-07 NOTE — ED Notes (Signed)
Pt's sisters left at this time. Marylene Land (emergency contact) requested for updates on patient. Number is in chart. Sister Sudan also left her number for an alternate contact: [place] [phone]

## 2022-11-07 NOTE — ED Triage Notes (Signed)
Pt presents to the ED via [facility] for seizure like activity. Per EMS pt fell down three stairs and was doing dramatic twitching activity. Pt was fighting EMS in the truck and pt was given 5mg  IM versed. Pt arrived in restraints with EMS. Restraints removed upon arrival. MD at bedside. 5mg  intranasal versed given per MD.

## 2022-11-07 NOTE — ED Notes (Signed)
Bed alarm went off and this RN went to bedside. Pt moving around in the bed and attempting to curl up. Blankets provided and patient calmed down. Sister remains at bedside.

## 2022-11-12 ENCOUNTER — Other Ambulatory Visit: Payer: Self-pay

## 2022-11-13 ENCOUNTER — Other Ambulatory Visit: Payer: Self-pay

## 2022-11-13 ENCOUNTER — Emergency Department (HOSPITAL_COMMUNITY)
Admission: EM | Admit: 2022-11-13 | Discharge: 2022-11-13 | Disposition: A | Discharge status: Home / Self Care | Payer: MEDICAID | Attending: Emergency Medicine | Admitting: Emergency Medicine

## 2022-11-13 ENCOUNTER — Encounter (HOSPITAL_COMMUNITY): Payer: Self-pay | Admitting: Emergency Medicine

## 2022-11-13 DIAGNOSIS — J449 Chronic obstructive pulmonary disease, unspecified: Secondary | ICD-10-CM | POA: Diagnosis not present

## 2022-11-13 DIAGNOSIS — F602 Antisocial personality disorder: Secondary | ICD-10-CM | POA: Insufficient documentation

## 2022-11-13 DIAGNOSIS — Z5900 Homelessness unspecified: Secondary | ICD-10-CM | POA: Diagnosis not present

## 2022-11-13 DIAGNOSIS — F1910 Other psychoactive substance abuse, uncomplicated: Secondary | ICD-10-CM

## 2022-11-13 DIAGNOSIS — F1410 Cocaine abuse, uncomplicated: Secondary | ICD-10-CM | POA: Insufficient documentation

## 2022-11-13 DIAGNOSIS — Z765 Malingerer [conscious simulation]: Secondary | ICD-10-CM

## 2022-11-13 DIAGNOSIS — R45851 Suicidal ideations: Secondary | ICD-10-CM | POA: Insufficient documentation

## 2022-11-13 LAB — RAPID URINE DRUG SCREEN, HOSP PERFORMED
Amphetamines: NOT DETECTED
Barbiturates: NOT DETECTED
Benzodiazepines: POSITIVE — AB
Cocaine: POSITIVE — AB
Opiates: NOT DETECTED
Tetrahydrocannabinol: POSITIVE — AB

## 2022-11-13 LAB — CBC
HCT: 43.6 % (ref 39.0–52.0)
Hemoglobin: 14.2 g/dL (ref 13.0–17.0)
MCH: 30.2 pg (ref 26.0–34.0)
MCHC: 32.6 g/dL (ref 30.0–36.0)
MCV: 92.8 fL (ref 80.0–100.0)
Platelets: 308 10*3/uL (ref 150–400)
RBC: 4.7 MIL/uL (ref 4.22–5.81)
RDW: 15 % (ref 11.5–15.5)
WBC: 7.6 10*3/uL (ref 4.0–10.5)
nRBC: 0 % (ref 0.0–0.2)

## 2022-11-13 LAB — COMPREHENSIVE METABOLIC PANEL
ALT: 23 U/L (ref 0–44)
AST: 30 U/L (ref 15–41)
Albumin: 4.1 g/dL (ref 3.5–5.0)
Alkaline Phosphatase: 76 U/L (ref 38–126)
Anion gap: 7 (ref 5–15)
BUN: 14 mg/dL (ref 6–20)
CO2: 26 mmol/L (ref 22–32)
Calcium: 9.1 mg/dL (ref 8.9–10.3)
Chloride: 108 mmol/L (ref 98–111)
Creatinine, Ser: 0.91 mg/dL (ref 0.61–1.24)
GFR, Estimated: >60 mL/min (ref >60)
Glucose, Bld: 126 mg/dL — ABNORMAL HIGH (ref 70–99)
Potassium: 4 mmol/L (ref 3.5–5.1)
Sodium: 141 mmol/L (ref 135–145)
Total Bilirubin: 1.9 mg/dL — ABNORMAL HIGH (ref <1.2)
Total Protein: 7.2 g/dL (ref 6.5–8.1)

## 2022-11-13 LAB — SALICYLATE LEVEL: Salicylate Lvl: <7 mg/dL — ABNORMAL LOW (ref 7.0–30.0)

## 2022-11-13 LAB — ETHANOL: Alcohol, Ethyl (B): <10 mg/dL (ref <10)

## 2022-11-13 LAB — ACETAMINOPHEN LEVEL: Acetaminophen (Tylenol), Serum: <10 ug/mL — ABNORMAL LOW (ref 10–30)

## 2022-11-13 MED ORDER — LORAZEPAM 2 MG/ML IJ SOLN: 0.0000 mg | Freq: Two times a day (BID) | INTRAMUSCULAR | Status: DC

## 2022-11-13 MED ORDER — LORAZEPAM 2 MG/ML IJ SOLN: 0.0000 mg | Freq: Four times a day (QID) | INTRAMUSCULAR | Status: DC

## 2022-11-13 MED ORDER — LORAZEPAM 1 MG PO TABS: 0.0000 mg | ORAL_TABLET | Freq: Four times a day (QID) | ORAL | Status: DC

## 2022-11-13 MED ORDER — LORAZEPAM 1 MG PO TABS: 0.0000 mg | ORAL_TABLET | Freq: Two times a day (BID) | ORAL | Status: DC

## 2022-11-13 NOTE — ED Provider Notes (Addendum)
Case reviewed with psychiatry.  Does not feel patient has a true intent to harm himself.  Pt is homeless has been using drugs.  Seen recently on 11/3 for similar symptoms.  Chronic SI but not felt to have acute suicide risk.   Informed patient that he does not need to be admitted to the psychiatric hospital.  Will discharge home with outpatient resources.    Linwood Dibbles, MD 11/13/22 2311

## 2022-11-13 NOTE — ED Notes (Signed)
Pt dressed in hospital appropriate attire for psych complaint. Belongings placed in triage patient belongings cabinet

## 2022-11-13 NOTE — ED Notes (Signed)
Pt was given a sandwich and a drink.

## 2022-11-13 NOTE — Consult Note (Signed)
[facility] Consult Note  Patient Name: Franklin [name] MRN: [id number] DOB: Apr 15, 1970 DATE OF Consult: 11/13/2022   TELEPSYCHIATRY ATTESTATION & CONSENT  As the provider for this telehealth consult, I attest that I verified the patient's identity using two separate identifiers, introduced myself to the patient, provided my credentials, disclosed my location, and performed this encounter via a HIPAA-compliant, real-time, face-to-face, two-way, interactive audio and video platform and with the full consent and agreement of the patient (or guardian as applicable.)  Patient physical location:  . [place] [facility] at [facility]   Bed: [facility]  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  Non-Medication/therapeutic recommendations: no sitter needed.  Is inpatient psychiatric hospitalization recommended for this patient?:             [x]  NO (Explain why not): no intention of death.. low risk in my eyes Attempted to leverage hospital for shelter and food. 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           Follow-Up Telepsychiatry C/L services:                 [x]  We will sign off for now. Please re-consult our service if needed for                       any concerning changes in the patient's condition, discharge planning, or                      questions.         []  We will continue to follow this patient with you until stabilized or discharged. If                    you have any questions or concerns, please call our TeleCare Coordination                     service at [phone] and ask for myself or the provider on-call.  Communication: Treatment team members (and family members if applicable) who were involved in treatment/care discussions and planning, and with whom we spoke or engaged with via secure text/chat, include the following: ED physician  Thank you for involving [place] in the care of this patient.      CHIEF COMPLAINT/REASON FOR CONSULT  Wants rehab or inpatient psych. Claims suicidal no plan  HISTORY OF PRESENT ILLNESS (HPI)    Franklin [name] is a 52 y.o. male with past medical history of COPD, bipolar 1, anxiety, polysubstance abuse, previous suicide attempt presents to emergency department for evaluation of SI without a plan.  Out of his meds for a week     He reports that he is interested in detox from alcohol and crack. Last use was last evening.  He reports he was discharged from inpatient Psychiatry two weeks ago.  he's been homeless using crack cocaine history of such for many years now. he claims he has a history rehabs many psychiatric hospitalizations. he doesn't appear to be any significant distress,  fairly comfortable laying bed eating a sandwich, claims  he is suicidal no concrete plan,   he has no strong intention for death. he claims he attempted suicide by hanging but the branch broke this is 5 years ago. he claims he's not taking his psychiatric medication he takes risperidone Prozac but he's unaware of the other medications. he is unaware of the dosages. he claims his homeless he lost his medications. he denies any psychosis. no auditory  or visual hallucinations  no Mania, no pressure speech,  no flight of ideas,   his appears comfortable with no sense of agitation  or combativeness. no  aggression.   he is seeking voluntary admission to inpatient psychiatry   versus rehab detox for the length of stay. patients affect and distress are incongruent with his claims of suicide.   There is strong malingering component to his presentation here to the ED\ Strong primary gain for housing reasons and financial He claims it tough out there panhandling   My suggestion he needs rehab and does not need acute inpatient psychiatric care This does not appear to be an emergency Nor is he in need of any emergent care or psych meds Consider discharge from ED with follow up referrals and homeless shelter referrals No sitter is needed    PAST PSYCHIATRIC HISTORY  Claims past psych hospitals [place] tried to hang self but branch broke Homeless Poor compliance Chronic substance use disorder   Otherwise as per HPI above.  PAST MEDICAL HISTORY  Past Medical History:  Diagnosis Date   Anxiety    Bipolar 1 disorder (HCC)    COPD (chronic obstructive pulmonary disease) (HCC)    Depression    Schizophrenia (HCC)    Substance abuse (HCC)       HOME MEDICATIONS  (Not in a hospital admission)       ALLERGIES  No Known Allergies  SOCIAL & SUBSTANCE USE HISTORY  Social History   Socioeconomic History   Marital status: Single    Spouse name: Not on file   Number of children: Not on file   Years of education: Not on file   Highest education level: Not on file  Occupational History   Occupation: [employer]  Tobacco Use   Smoking status: Some Days    Current packs/day: 0.50    Types: Cigarettes   Smokeless tobacco: Never  Vaping Use   Vaping status: Never Used  Substance and Sexual Activity   Alcohol use: Yes    Alcohol/week: 12.0 standard drinks of alcohol    Types: 12 Cans of beer per week   Drug use: Yes    Types: "Crack" cocaine, Cocaine    Comment: one gram daily   Sexual activity: Yes  Other Topics Concern   Not on file  Social History Narrative   The patient was born and  raised in with that by both his biological parents. His father's past way but his mother still living. He denies any history of any physical or sexual abuse. He says he completed 2 years of college at [employer]. 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 He has never been married but has a 66 year old son who lives with his mother. He says he  does get to see his son and has a relationship with him. He is not currently dating or in a relationship.      The patient does have a history of a DUI and had a court date last Friday for a DUI. He denies any other pending charges.            Social Determinants of Health   Financial Resource Strain: Not on file  Food Insecurity: Food Insecurity Present (10/27/2022)   Hunger Vital Sign    Worried About Running Out of Food in the Last Year: Sometimes true    Ran Out of Food in the Last Year: Sometimes true  Transportation Needs: Unmet Transportation Needs (10/27/2022)   PRAPARE - Administrator, Civil Service (Medical): Yes    Lack of Transportation (Non-Medical): Yes  Physical Activity: Not on file  Stress: Not on file  Social Connections: Unknown (10/10/2022)   Received from [facility]   Social Network    Social Network: Not on file   polysub   FAMILY HISTORY  Family History  Problem Relation Age of Onset   Hypertension Other    Diabetes Other    Family Psychiatric History (if known):      MENTAL STATUS EXAM (MSE)  Appearance:  [x]  Appropriately dressed for the context and circumstances      []  Well-groomed       []  Casually dressed  []  Hospital gown       []  Unkempt   Attitude:  [x]  Cooperative      []  Guarded       []  Aggressive      []  Demanding       []  Suspicious       []  Withdrawn  []  Attention-seeking      []  Sullen  Behavior:   [x]  Normal motor activity and eye contact   []  Psychomotor slowing       []  Poor eye contact         []   Tearful []  Restless       []  Fidgeting        []  Psychomotor agitation  Impulse Control  []  Good     [x]  Fair     []  Poor  Speech: [x]  Within normal limits for the context and circumstances  []  Slow       []  Soft       []  Monotone      []  Little spontaneous speech    []  Incoherent     []  Slurred    []  Excessive       []  Rapid        [] Loud       []  Pressured       []  Dramatic      []  Accent noted  Mood:  [x]  Euthymic      []  Dysthymic     []  Depressed        []  Anxious        []  Angry/Irritable   []  Expansive     []  Euphoric/Elated      Affect:  [x]  Congruent and appropriate to content of speech and circumstances  []  Full range    []  Constricted    []  Flat    []  Blunted    []  Exaggerated     []  Labile     []  Inappropriate:  Thought process:  [x]  Linear, logical and goal directed    []  Disorganized    []  Circumstantial    []   Circumferential    []  Flight of ideas  []  Tangential   []  Thought blocking   []  Loose associations  Thought content:  [x]  Denies hallucinations, delusions, or paranoia    []  Hallucinations []  Delusions      []  Paranoia        []  Ideas of reference      []  Obsessions        []  ELOC  Suicidality and Homicidality:  []  Denies any active or passive suicidal or homicidal ideation   [x]  Passive suicidal ideation []  Active suicidal ideation     []  Passive homicidal ideation      []  Active homicidal ideation      []  As per HPI  Insight:   []  Good      [x]  Fair      []  Poor  Judgement:    []  Good      [x]  Fair      []  Poor  Basic Memory:    []  Good       [x]  Fair      []  Poor  Basic Attention/Concentration:    []  Good      [x]  Fair      []  Poor  Orientation:      [x]  Oriented to person, place, and time        []  Disoriented   VITALS (IF TAKEN)   @VSR @   BP 131/70 (BP Location: Left Arm)   Pulse 69   Temp 98.3 F (36.8 C) (Oral)   Resp 18   Ht 5\' 10"  (1.778 m)   Wt 70.3 kg   SpO2 97%   BMI 22.24 kg/m    LABS (IF COLLECTED)  Admission on 11/13/2022   Component Date Value Ref Range Status   Sodium 11/13/2022 141  135 - 145 mmol/L Final   Potassium 11/13/2022 4.0  3.5 - 5.1 mmol/L Final   Chloride 11/13/2022 108  98 - 111 mmol/L Final   CO2 11/13/2022 26  22 - 32 mmol/L Final   Glucose, Bld 11/13/2022 126 (H)  70 - 99 mg/dL Final   Glucose reference range applies only to samples taken after fasting for at least 8 hours.   BUN 11/13/2022 14  6 - 20 mg/dL Final   Creatinine, Ser 11/13/2022 0.91  0.61 - 1.24 mg/dL Final   Calcium 16/10/9602 9.1  8.9 - 10.3 mg/dL Final   Total Protein 54/09/8117 7.2  6.5 - 8.1 g/dL Final   Albumin 14/78/2956 4.1  3.5 - 5.0 g/dL Final   AST 21/30/8657 30  15 - 41 U/L Final   ALT 11/13/2022 23  0 - 44 U/L Final   Alkaline Phosphatase 11/13/2022 76  38 - 126 U/L Final   Total Bilirubin 11/13/2022 1.9 (H)  <1.2 mg/dL Final   GFR, Estimated 11/13/2022 >60  >60 mL/min Final   Comment: (NOTE) Calculated using the CKD-EPI Creatinine Equation (2021)    Anion gap 11/13/2022 7  5 - 15 Final   Performed at [facility], [address] [place], [place] [address]   Alcohol, Ethyl (B) 11/13/2022 <10  <10 mg/dL Final   Comment: (NOTE) Lowest detectable limit for serum alcohol is 10 mg/dL.  For medical purposes only. Performed at [facility], [address], [place], [place] [address]    Salicylate Lvl 11/13/2022 <7.0 (L)  7.0 - 30.0 mg/dL Final   Performed at [facility], [address], [place], [place] [address]  Acetaminophen (Tylenol), Serum 11/13/2022 <10 (L)  10 - 30 ug/mL Final   Comment: (NOTE) Therapeutic concentrations vary significantly. A range of 10-30 ug/mL  may be an effective concentration for many patients. However, some  are best treated at concentrations outside of this range. Acetaminophen concentrations >150 ug/mL at 4 hours after ingestion  and >50 ug/mL at 12 hours after ingestion are often associated with  toxic  reactions.  Performed at [facility], [address], [place], [place] [address]    WBC 11/13/2022 7.6  4.0 - 10.5 K/uL Final   RBC 11/13/2022 4.70  4.22 - 5.81 MIL/uL Final   Hemoglobin 11/13/2022 14.2  13.0 - 17.0 g/dL Final   HCT 46/96/2952 43.6  39.0 - 52.0 % Final   MCV 11/13/2022 92.8  80.0 - 100.0 fL Final   MCH 11/13/2022 30.2  26.0 - 34.0 pg Final   MCHC 11/13/2022 32.6  30.0 - 36.0 g/dL Final   RDW 84/13/2440 15.0  11.5 - 15.5 % Final   Platelets 11/13/2022 308  150 - 400 K/uL Final   nRBC 11/13/2022 0.0  0.0 - 0.2 % Final   Performed at [facility], [address], [place], [place] [address]   Opiates 11/13/2022 NONE DETECTED  NONE DETECTED Final   Cocaine 11/13/2022 POSITIVE (A)  NONE DETECTED Final   Benzodiazepines 11/13/2022 POSITIVE (A)  NONE DETECTED Final   Amphetamines 11/13/2022 NONE DETECTED  NONE DETECTED Final   Tetrahydrocannabinol 11/13/2022 POSITIVE (A)  NONE DETECTED Final   Barbiturates 11/13/2022 NONE DETECTED  NONE DETECTED Final   Comment: (NOTE) DRUG SCREEN FOR MEDICAL PURPOSES ONLY.  IF CONFIRMATION IS NEEDED FOR ANY PURPOSE, NOTIFY LAB WITHIN 5 DAYS.  LOWEST DETECTABLE LIMITS FOR URINE DRUG SCREEN Drug Class                     Cutoff (ng/mL) Amphetamine and metabolites    1000 Barbiturate and metabolites    200 Benzodiazepine                 200 Opiates and metabolites        300 Cocaine and metabolites        300 THC                            50 Performed at [facility], [address], [place], [place] [address]     ROS & ADDITIONAL FINDINGS  ROS: Notable for the following relevant positive findings: Psychiatric: per hpi Other notable positive ROS findings: per hpi  Additional findings:      Musculoskeletal:    [x]  No Abnormal Movements Observed        []  Impaired      Gait & Station:        [x]  Normal        []  Wheelchair/Walker          []  Laying/Sitting       Pain  Screening:   [x]  Denies    []  Present--mild to moderate     []  Present--severe (will                             consider referral for ongoing evaluation and treatment)      Nutrition & Dental Concerns: no eating disorder   RISK ASSESSMENT*  Is the patient experiencing any suicidal or homicidal  ideations:     [x] YES        []  NO       Explain if yes: passive with no intent.  Protective factors considered for safety management: verbal cooperative  Risk factors/concerns considered for safety management: (check all that apply) [x]  Prior attempt                                      []  Hopelessness       []  Family history of suicide                    []  Impulsivity []  Depression                                         []  Aggression [x]  Substance abuse/dependence          []  Isolation []  Physical illness/chronic pain              []  Barriers to accessing treatment []  Recent loss                                        []  Unwillingness to seek help []  Access to lethal means                      [x]  Male gender []  Age over 59                                        [x]  Unmarried  Is there a safety management plan with the patient and treatment team to minimize risk factors and promote protective factors:     [x]  YES      []  NO            Explain: routine nursing obs       Based on my current evaluation and risk assessment of the patient at the time of this encounter, this patient is considered to be at:   [x]    Low Risk                      []   Moderate Risk                     []   High Risk  *RISK ASSESSMENT Risk assessment is a dynamic process; it is possible that this patient's condition, and risk level, may change. This should be re-evaluated and managed over time as appropriate. Please re-consult psychiatric consult services if additional assistance is needed in terms of risk assessment and management. If your team decides to discharge this patient, please advise the patient how to best access  emergency psychiatric services, or to call 911, if their condition worsens or they feel unsafe in any way.   CW Antonietta Breach, M. D., [facility]

## 2022-11-13 NOTE — ED Notes (Signed)
Pt refusing to leave even after this nurse discharged pt. Security requested to assist with redirecting patient to leave as he has been provided discharge information and education.

## 2022-11-13 NOTE — ED Triage Notes (Signed)
Patient arrives ambulatory by POV c/o suicidal ideations and needing help with detox from crack and alcohol. Reports last use of both last night. Does not have a plan for SI but states he has attempted in the past. Patient snoring in triage.

## 2022-11-13 NOTE — BH Assessment (Signed)
 TTS requested tele-psychiatry consult with [facility]. Created secure conversation including EDP, Pt's RN, and [facility] Coordinators to facilitate consult. [facility] Coordinator will message with name of provider and scheduled consult time.    Pamalee Leyden, [facility], [facility] Triage Specialist

## 2022-11-13 NOTE — Discharge Instructions (Addendum)
Please follow-up with the outpatient mental health and substance abuse resources.

## 2022-11-13 NOTE — ED Provider Notes (Signed)
  Physical Exam  BP (!) 141/106 (BP Location: Left Arm)   Pulse 73   Temp (!) 97.5 F (36.4 C) (Oral)   Resp 18   Ht 5\' 10"  (1.778 m)   Wt 70.3 kg   SpO2 100%   BMI 22.24 kg/m     Procedures  Procedures  ED Course / MDM    Medical Decision Making Amount and/or Complexity of Data Reviewed Labs: ordered.  Risk Prescription drug management.   Patient was cleared medically for psych evaluation by previous provider.  Psych hold orders placed.       Marita Kansas, PA-C 11/13/22 1750    Linwood Dibbles, MD 11/13/22 [phone]

## 2022-11-13 NOTE — ED Provider Notes (Signed)
EMERGENCY DEPARTMENT AT [facility] Inpatient Provider Note   CSN: [id number] Arrival date & time: 11/13/22  1024     History  Chief Complaint  Patient presents with   Suicidal   Detox    Franklin [name] is a 52 y.o. male with past medical history of COPD, bipolar 1, anxiety, polysubstance abuse, previous suicide attempt presents to emergency department for evaluation of SI without a plan.  Out of his meds for a week    He reports that he is interested in detox from alcohol and crack. Last use was last evening.  He denies headaches, hallucinations, self-injury, N/V, sweats, anxiety.  HPI     Home Medications Prior to Admission medications   Medication Sig Start Date End Date Taking? Authorizing Provider  albuterol (VENTOLIN HFA) 108 (90 Base) MCG/ACT inhaler Inhale 2 puffs into the lungs every 6 (six) hours as needed for wheezing or shortness of breath. 11/01/22   Sarina Ill, DO  carvedilol (COREG) 3.125 MG tablet Take 1 tablet (3.125 mg total) by mouth 2 (two) times daily with a meal. 11/01/22   Sarina Ill, DO  FLUoxetine (PROZAC) 20 MG capsule Take 1 capsule (20 mg total) by mouth daily. 11/02/22   Sarina Ill, DO  fluticasone-salmeterol (ADVAIR DISKUS) 250-50 MCG/ACT AEPB Inhale 1 puff into the lungs in the morning and at bedtime. 11/01/22   Sarina Ill, DO  risperiDONE (RISPERDAL) 0.5 MG tablet Take 1 tablet (0.5 mg total) by mouth 2 (two) times daily at 8 am and 4 pm. 11/01/22   Sarina Ill, DO  traZODone (DESYREL) 100 MG tablet Take 1 tablet (100 mg total) by mouth at bedtime. 11/01/22   Sarina Ill, DO      Allergies    Patient has no known allergies.    Review of Systems   Review of Systems  Constitutional:  Negative for chills, fatigue and fever.  Respiratory:  Negative for cough, chest tightness, shortness of breath and wheezing.   Cardiovascular:  Negative for chest pain and  palpitations.  Gastrointestinal:  Negative for abdominal pain, constipation, diarrhea, nausea and vomiting.  Neurological:  Negative for dizziness, seizures, weakness, light-headedness, numbness and headaches.  Psychiatric/Behavioral:  Positive for suicidal ideas.     Physical Exam Updated Vital Signs BP (!) 141/106 (BP Location: Left Arm)   Pulse 73   Temp (!) 97.5 F (36.4 C) (Oral)   Resp 18   Ht 5\' 10"  (1.778 m)   Wt 70.3 kg   SpO2 100%   BMI 22.24 kg/m  Physical Exam Vitals and nursing note reviewed.  Constitutional:      General: He is not in acute distress.    Appearance: Normal appearance.  HENT:     Head: Normocephalic and atraumatic.  Eyes:     Conjunctiva/sclera: Conjunctivae normal.  Cardiovascular:     Rate and Rhythm: Normal rate.     Pulses: Normal pulses.     Heart sounds: Normal heart sounds.  Pulmonary:     Effort: Pulmonary effort is normal. No respiratory distress.     Breath sounds: Normal breath sounds.  Skin:    General: Skin is warm.     Capillary Refill: Capillary refill takes less than 2 seconds.     Coloration: Skin is not jaundiced or pale.  Neurological:     General: No focal deficit present.     Mental Status: He is alert and oriented to person, place, and time. Mental status  is at baseline.     Cranial Nerves: No cranial nerve deficit.     Sensory: No sensory deficit.     Motor: No weakness.     Coordination: Coordination normal.     Gait: Gait normal.     Deep Tendon Reflexes: Reflexes normal.     Comments: Follows commands appropriately Ambulated without difficulty     ED Results / Procedures / Treatments   Labs (all labs ordered are listed, but only abnormal results are displayed) Labs Reviewed  COMPREHENSIVE METABOLIC PANEL - Abnormal; Notable for the following components:      Result Value   Glucose, Bld 126 (*)    Total Bilirubin 1.9 (*)    All other components within normal limits  SALICYLATE LEVEL - Abnormal; Notable  for the following components:   Salicylate Lvl <7.0 (*)    All other components within normal limits  ACETAMINOPHEN LEVEL - Abnormal; Notable for the following components:   Acetaminophen (Tylenol), Serum <10 (*)    All other components within normal limits  RAPID URINE DRUG SCREEN, HOSP PERFORMED - Abnormal; Notable for the following components:   Cocaine POSITIVE (*)    Benzodiazepines POSITIVE (*)    Tetrahydrocannabinol POSITIVE (*)    All other components within normal limits  ETHANOL  CBC    EKG None  Radiology No results found.  Procedures Procedures    Medications Ordered in ED Medications - No data to display  ED Course/ Medical Decision Making/ A&P                                 Medical Decision Making Amount and/or Complexity of Data Reviewed Labs: ordered.   Patient presents to the ED for concern of SI and etoh and crack detox, this involves an extensive number of treatment options, and is a complaint that carries with it a high risk of complications and morbidity.    Co morbidities that complicate the patient evaluation  Polysubstance abuse   Additional history obtained:  Additional history obtained from Nursing, Outside Medical Records, and Past Admission   External records from outside source obtained and reviewed   Lab Tests:  I Ordered, and personally interpreted labs.  The pertinent results include: UDS positive for cocaine, THC, and benzos    Consultations Obtained:  I requested consultation with TTS and pending upon sign out   Problem List / ED Course:  SI   Reevaluation:  After the interventions noted above, I reevaluated the patient and found that they have :stayed the same    Dispostion:  Patient is resting comfortably in hospital bed. He has no complaints of anxiety, HA, tremors, hallucinations. CIWA 0. He presents for evaluation of SI without a plan and detox from ETOH and crack. Med clearance labs  pending.  Patient is medically clear. TTS pending  Sign out to Amjad PA pending TTS recommendation        Final Clinical Impression(s) / ED Diagnoses Final diagnoses:  None    Rx / DC Orders ED Discharge Orders     None         Judithann Sheen, PA 11/13/22 1525    Rondel Baton, MD 11/17/22 1404

## 2022-11-17 ENCOUNTER — Inpatient Hospital Stay
Admission: AD | Admit: 2022-11-17 | Discharge: 2022-11-24 | DRG: 885 | Disposition: A | Discharge status: Home / Self Care | Payer: MEDICAID | Source: Intra-hospital | Attending: Psychiatry | Admitting: Psychiatry

## 2022-11-17 ENCOUNTER — Other Ambulatory Visit: Payer: Self-pay

## 2022-11-17 ENCOUNTER — Emergency Department
Admission: EM | Admit: 2022-11-17 | Discharge: 2022-11-17 | Disposition: A | Discharge status: Other Acute Inpatient Hospital | Payer: MEDICAID | Attending: Emergency Medicine | Admitting: Emergency Medicine

## 2022-11-17 ENCOUNTER — Encounter: Payer: Self-pay | Admitting: Psychiatry

## 2022-11-17 DIAGNOSIS — Z8249 Family history of ischemic heart disease and other diseases of the circulatory system: Secondary | ICD-10-CM | POA: Diagnosis not present

## 2022-11-17 DIAGNOSIS — F1410 Cocaine abuse, uncomplicated: Secondary | ICD-10-CM | POA: Diagnosis present

## 2022-11-17 DIAGNOSIS — F332 Major depressive disorder, recurrent severe without psychotic features: Secondary | ICD-10-CM | POA: Diagnosis present

## 2022-11-17 DIAGNOSIS — F333 Major depressive disorder, recurrent, severe with psychotic symptoms: Secondary | ICD-10-CM | POA: Diagnosis present

## 2022-11-17 DIAGNOSIS — F32A Depression, unspecified: Secondary | ICD-10-CM | POA: Diagnosis present

## 2022-11-17 DIAGNOSIS — M545 Low back pain, unspecified: Secondary | ICD-10-CM | POA: Diagnosis not present

## 2022-11-17 DIAGNOSIS — Z5982 Transportation insecurity: Secondary | ICD-10-CM | POA: Diagnosis not present

## 2022-11-17 DIAGNOSIS — Z7951 Long term (current) use of inhaled steroids: Secondary | ICD-10-CM

## 2022-11-17 DIAGNOSIS — F329 Major depressive disorder, single episode, unspecified: Principal | ICD-10-CM | POA: Diagnosis present

## 2022-11-17 DIAGNOSIS — J449 Chronic obstructive pulmonary disease, unspecified: Secondary | ICD-10-CM | POA: Insufficient documentation

## 2022-11-17 DIAGNOSIS — R45851 Suicidal ideations: Secondary | ICD-10-CM | POA: Diagnosis present

## 2022-11-17 DIAGNOSIS — Z79899 Other long term (current) drug therapy: Secondary | ICD-10-CM

## 2022-11-17 DIAGNOSIS — F339 Major depressive disorder, recurrent, unspecified: Secondary | ICD-10-CM | POA: Diagnosis not present

## 2022-11-17 DIAGNOSIS — F322 Major depressive disorder, single episode, severe without psychotic features: Secondary | ICD-10-CM | POA: Diagnosis not present

## 2022-11-17 DIAGNOSIS — Z5941 Food insecurity: Secondary | ICD-10-CM | POA: Diagnosis not present

## 2022-11-17 DIAGNOSIS — F419 Anxiety disorder, unspecified: Secondary | ICD-10-CM | POA: Diagnosis present

## 2022-11-17 DIAGNOSIS — G4700 Insomnia, unspecified: Secondary | ICD-10-CM | POA: Diagnosis present

## 2022-11-17 DIAGNOSIS — F17210 Nicotine dependence, cigarettes, uncomplicated: Secondary | ICD-10-CM | POA: Diagnosis present

## 2022-11-17 DIAGNOSIS — Z833 Family history of diabetes mellitus: Secondary | ICD-10-CM | POA: Diagnosis not present

## 2022-11-17 DIAGNOSIS — Z560 Unemployment, unspecified: Secondary | ICD-10-CM | POA: Diagnosis not present

## 2022-11-17 LAB — CBC
HCT: 46.8 % (ref 39.0–52.0)
Hemoglobin: 15.2 g/dL (ref 13.0–17.0)
MCH: 30 pg (ref 26.0–34.0)
MCHC: 32.5 g/dL (ref 30.0–36.0)
MCV: 92.3 fL (ref 80.0–100.0)
Platelets: 345 10*3/uL (ref 150–400)
RBC: 5.07 MIL/uL (ref 4.22–5.81)
RDW: 14.6 % (ref 11.5–15.5)
WBC: 7.6 10*3/uL (ref 4.0–10.5)
nRBC: 0 % (ref 0.0–0.2)

## 2022-11-17 LAB — ETHANOL: Alcohol, Ethyl (B): <10 mg/dL (ref <10)

## 2022-11-17 LAB — COMPREHENSIVE METABOLIC PANEL
ALT: 20 U/L (ref 0–44)
AST: 22 U/L (ref 15–41)
Albumin: 4.6 g/dL (ref 3.5–5.0)
Alkaline Phosphatase: 61 U/L (ref 38–126)
Anion gap: 10 (ref 5–15)
BUN: 10 mg/dL (ref 6–20)
CO2: 29 mmol/L (ref 22–32)
Calcium: 9 mg/dL (ref 8.9–10.3)
Chloride: 100 mmol/L (ref 98–111)
Creatinine, Ser: 0.68 mg/dL (ref 0.61–1.24)
GFR, Estimated: >60 mL/min (ref >60)
Glucose, Bld: 90 mg/dL (ref 70–99)
Potassium: 4.1 mmol/L (ref 3.5–5.1)
Sodium: 139 mmol/L (ref 135–145)
Total Bilirubin: 1.1 mg/dL (ref <1.2)
Total Protein: 8.1 g/dL (ref 6.5–8.1)

## 2022-11-17 LAB — ACETAMINOPHEN LEVEL: Acetaminophen (Tylenol), Serum: <10 ug/mL — ABNORMAL LOW (ref 10–30)

## 2022-11-17 LAB — URINE DRUG SCREEN, QUALITATIVE (ARMC ONLY)
Amphetamines, Ur Screen: NOT DETECTED
Barbiturates, Ur Screen: NOT DETECTED
Benzodiazepine, Ur Scrn: POSITIVE — AB
Cannabinoid 50 Ng, Ur ~~LOC~~: POSITIVE — AB
Cocaine Metabolite,Ur ~~LOC~~: POSITIVE — AB
MDMA (Ecstasy)Ur Screen: NOT DETECTED
Methadone Scn, Ur: NOT DETECTED
Opiate, Ur Screen: NOT DETECTED
Phencyclidine (PCP) Ur S: NOT DETECTED
Tricyclic, Ur Screen: NOT DETECTED

## 2022-11-17 LAB — SALICYLATE LEVEL: Salicylate Lvl: <7 mg/dL — ABNORMAL LOW (ref 7.0–30.0)

## 2022-11-17 MED ORDER — IBUPROFEN 600 MG PO TABS
600.0000 mg | ORAL_TABLET | Freq: Four times a day (QID) | ORAL | Status: DC | PRN
Start: 2022-11-17 — End: 2022-11-17
  Administered 2022-11-17: 600 mg via ORAL
  Filled 2022-11-17: qty 1

## 2022-11-17 MED ORDER — DIPHENHYDRAMINE HCL 25 MG PO CAPS: 50.0000 mg | ORAL_CAPSULE | Freq: Three times a day (TID) | ORAL | Status: DC | PRN

## 2022-11-17 MED ORDER — TOPIRAMATE 25 MG PO TABS
50.0000 mg | ORAL_TABLET | Freq: Every day | ORAL | Status: DC
  Administered 2022-11-17 – 2022-11-24 (×8): 50 mg via ORAL
  Filled 2022-11-17 (×8): qty 2

## 2022-11-17 MED ORDER — TRAZODONE HCL 100 MG PO TABS
100.0000 mg | ORAL_TABLET | Freq: Every evening | ORAL | Status: DC | PRN
Start: 2022-11-17 — End: 2022-11-24
  Administered 2022-11-19 – 2022-11-22 (×4): 100 mg via ORAL
  Filled 2022-11-17 (×4): qty 1

## 2022-11-17 MED ORDER — HALOPERIDOL LACTATE 5 MG/ML IJ SOLN: 5.0000 mg | Freq: Three times a day (TID) | INTRAMUSCULAR | Status: DC | PRN

## 2022-11-17 MED ORDER — LORAZEPAM 2 MG PO TABS: 0.0000 mg | ORAL_TABLET | Freq: Two times a day (BID) | ORAL | Status: AC

## 2022-11-17 MED ORDER — LORAZEPAM 2 MG PO TABS: 2.0000 mg | ORAL_TABLET | Freq: Three times a day (TID) | ORAL | Status: DC | PRN

## 2022-11-17 MED ORDER — MAGNESIUM HYDROXIDE 400 MG/5ML PO SUSP: 30.0000 mL | Freq: Every day | ORAL | Status: DC | PRN

## 2022-11-17 MED ORDER — ADULT MULTIVITAMIN W/MINERALS CH
1.0000 | ORAL_TABLET | Freq: Every day | ORAL | Status: DC
Start: 2022-11-17 — End: 2022-11-24
  Administered 2022-11-17 – 2022-11-24 (×8): 1 via ORAL
  Filled 2022-11-17 (×8): qty 1

## 2022-11-17 MED ORDER — LORAZEPAM 2 MG PO TABS
0.0000 mg | ORAL_TABLET | Freq: Four times a day (QID) | ORAL | Status: AC
Start: 2022-11-17 — End: 2022-11-19
  Administered 2022-11-17: 2 mg via ORAL
  Filled 2022-11-17: qty 1

## 2022-11-17 MED ORDER — LORAZEPAM 2 MG/ML IJ SOLN: 2.0000 mg | Freq: Three times a day (TID) | INTRAMUSCULAR | Status: DC | PRN

## 2022-11-17 MED ORDER — ACETAMINOPHEN 325 MG PO TABS
650.0000 mg | ORAL_TABLET | Freq: Four times a day (QID) | ORAL | Status: DC | PRN
  Administered 2022-11-21 – 2022-11-23 (×2): 650 mg via ORAL
  Filled 2022-11-17 (×2): qty 2

## 2022-11-17 MED ORDER — HALOPERIDOL 5 MG PO TABS: 5.0000 mg | ORAL_TABLET | Freq: Three times a day (TID) | ORAL | Status: DC | PRN

## 2022-11-17 MED ORDER — LORAZEPAM 1 MG PO TABS
1.0000 mg | ORAL_TABLET | ORAL | Status: AC | PRN
  Administered 2022-11-18: 1 mg via ORAL

## 2022-11-17 MED ORDER — RISPERIDONE 1 MG PO TBDP
1.0000 mg | ORAL_TABLET | Freq: Every day | ORAL | Status: DC
  Administered 2022-11-17: 1 mg via ORAL
  Filled 2022-11-17: qty 1

## 2022-11-17 MED ORDER — FOLIC ACID 1 MG PO TABS
1.0000 mg | ORAL_TABLET | Freq: Every day | ORAL | Status: DC
  Administered 2022-11-17 – 2022-11-24 (×8): 1 mg via ORAL
  Filled 2022-11-17 (×8): qty 1

## 2022-11-17 MED ORDER — DIPHENHYDRAMINE HCL 50 MG/ML IJ SOLN: 50.0000 mg | Freq: Three times a day (TID) | INTRAMUSCULAR | Status: DC | PRN

## 2022-11-17 MED ORDER — THIAMINE MONONITRATE 100 MG PO TABS
100.0000 mg | ORAL_TABLET | Freq: Every day | ORAL | Status: DC
  Administered 2022-11-17 – 2022-11-24 (×7): 100 mg via ORAL
  Filled 2022-11-17 (×8): qty 1

## 2022-11-17 MED ORDER — LORAZEPAM 1 MG PO TABS
1.0000 mg | ORAL_TABLET | ORAL | Status: AC | PRN
  Filled 2022-11-17: qty 1

## 2022-11-17 MED ORDER — CARVEDILOL 3.125 MG PO TABS
3.1250 mg | ORAL_TABLET | Freq: Two times a day (BID) | ORAL | Status: DC
  Administered 2022-11-18 – 2022-11-24 (×12): 3.125 mg via ORAL
  Filled 2022-11-17 (×13): qty 1

## 2022-11-17 MED ORDER — ALUM & MAG HYDROXIDE-SIMETH 200-200-20 MG/5ML PO SUSP
30.0000 mL | ORAL | Status: DC | PRN
  Administered 2022-11-20 – 2022-11-22 (×2): 30 mL via ORAL
  Filled 2022-11-17 (×2): qty 30

## 2022-11-17 MED ORDER — THIAMINE HCL 100 MG/ML IJ SOLN
100.0000 mg | Freq: Every day | INTRAMUSCULAR | Status: DC
  Filled 2022-11-17: qty 2

## 2022-11-17 NOTE — ED Notes (Signed)
Hospital meal provided.  100% consumed, pt tolerated w/o complaints.  Waste discarded appropriately.   

## 2022-11-17 NOTE — Group Note (Signed)
Date:  11/17/2022 Time:  10:04 PM  Group Topic/Focus:  Wrap-Up Group:   The focus of this group is to help patients review their daily goal of treatment and discuss progress on daily workbooks.    Participation Level:  Active  Participation Quality:  Appropriate and Attentive  Affect:  Appropriate  Cognitive:  Appropriate  Insight: Appropriate and Good  Engagement in Group:  Developing/Improving  Modes of Intervention:  Clarification, Rapport Building, and Support  Additional Comments:     Calisa Luckenbaugh 11/17/2022, 10:04 PM

## 2022-11-17 NOTE — Group Note (Signed)
Date:  11/17/2022 Time:  6:06 PM  Group Topic/Focus:  Rediscovering Joy:   The focus of this group is to explore various ways to relieve stress in a positive manner. OUTDOOR RECREATION STRUCTURED ACTIVITY    Participation Level:  Did Not Attend  Lael Pilch 11/17/2022, 6:06 PM

## 2022-11-17 NOTE — Progress Notes (Signed)
Franklin [name] is a 52 year old male admitted voluntarily from [facility] ED to [facility]U due to suicidal ideation with a plan to get hit by a train.   Patient with multiple prior psychiatric admissions. Patient recently discharged from this unit on November 01, 2022. Patient presented A&OX4 with flat affect. Patient noted to have a slow gait due to generalized weakness and stating his feet hurt all the time. Patient stated after discharging he has been homeless with lack of transportation. Patient states he has been drinking alcohol and doing drugs. Patient UDS positive for cocaine, benzos, and cannabis. Patient also claims to have lost his medication so he has not taken any of his prescribed medication. Patient currently endorses passive SI but denies HI and A/V/H. Patient oriented to unit and unit rules. Patient provided with a meal. Diastolic number on BP noted to be elevated but otherwise WDL. No aggressive behavior. Normal speech. Cooperative on unit with no s/s of current distress.   BP (!) 127/95 (BP Location: Left Arm)   Pulse 78   Temp 98.9 F (37.2 C) (Oral)   Resp 18   Ht 5\' 10"  (1.778 m)   Wt 67.1 kg   SpO2 97%   BMI 21.24 kg/m

## 2022-11-17 NOTE — Consult Note (Signed)
[facility] Face-to-Face Psychiatry Consult   Reason for Consult:  Psychiatric Evaluation Referring Physician:  Ginette Pitman MD Patient Identification: Franklin [name] MRN:  [id number] Principal Diagnosis: <principal problem not specified> Diagnosis: Major depressive disorder, severe without psychotic features and suicidal ideation.  Total Time spent with patient: 30 minutes  Subjective:   Franklin [name] is a 52 y.o. male patient admitted with suicidal ideations. "I was sitting on the tracks and I shouldn't be out here feeling like this".  HPI:  Franklin [name] is a 52 y.o. male patient admitted with suicidal ideations.  Patient has a psychiatric history of polysubstance use disorder, major depressive disorder, and generalized anxiety.  He has a psychiatric history of multiple hospitalizations with his last hospitalization being here at [facility], discharging on November 01, 2022.  Patient reports active suicidal ideations, stating that he was on the train tracks last night which is his current plan for suicide.  He reports a plan to walk on the train tracks in front of an oncoming train.  Patient reports a history of crack cocaine use and alcohol use, with most recent use last night.  Patient does have the desire to participate in substance abuse treatment and reports discharging from his last psychiatric hospitalization and only taking his medications for 1 week prior to quitting his medications and restarting crack cocaine use and alcohol use.  Patient reports thoughts of his sons death 3 years ago which he attributes to his suicidal ideations. Patient exhibits the desire for improvement and is agreeing to transition to inpatient psychiatry for continued treatment.  He denies homicidal ideations, auditory or visual hallucinations, paranoia or delusional thought.   Past Psychiatric History: See above  Risk to Self:  Yes Risk to Others:  No Prior Inpatient Therapy:  Yes Prior Outpatient Therapy:  Yes  Past  Medical History:  Past Medical History:  Diagnosis Date   Anxiety    Bipolar 1 disorder (HCC)    COPD (chronic obstructive pulmonary disease) (HCC)    Depression    Schizophrenia (HCC)    Substance abuse (HCC)     Past Surgical History:  Procedure Laterality Date   NO PAST SURGERIES     Family History:  Family History  Problem Relation Age of Onset   Hypertension Other    Diabetes Other    Family Psychiatric  History: None reported Social History:  Social History   Substance and Sexual Activity  Alcohol Use Yes   Alcohol/week: 12.0 standard drinks of alcohol   Types: 12 Cans of beer per week     Social History   Substance and Sexual Activity  Drug Use Yes   Types: "Crack" cocaine, Cocaine   Comment: one gram daily    Social History   Socioeconomic History   Marital status: Single    Spouse name: Not on file   Number of children: Not on file   Years of education: Not on file   Highest education level: Not on file  Occupational History   Occupation: [employer]  Tobacco Use   Smoking status: Some Days    Current packs/day: 0.50    Types: Cigarettes   Smokeless tobacco: Never  Vaping Use   Vaping status: Never Used  Substance and Sexual Activity   Alcohol use: Yes    Alcohol/week: 12.0 standard drinks of alcohol    Types: 12 Cans of beer per week   Drug use: Yes    Types: "Crack" cocaine, Cocaine    Comment: one gram daily  Sexual activity: Yes  Other Topics Concern   Not on file  Social History Narrative   The patient was born and raised in with that by both his biological parents. His father's past way but his mother still living. He denies any history of any physical or sexual abuse. He says he completed 2 years of college at [employer]. 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 He has never been married but has a 30 year old son who lives with his mother. He says he does get to see  his son and has a relationship with him. He is not currently dating or in a relationship.      The patient does have a history of a DUI and had a court date last Friday for a DUI. He denies any other pending charges.            Social Determinants of Health   Financial Resource Strain: Not on file  Food Insecurity: Food Insecurity Present (10/27/2022)   Hunger Vital Sign    Worried About Running Out of Food in the Last Year: Sometimes true    Ran Out of Food in the Last Year: Sometimes true  Transportation Needs: Unmet Transportation Needs (10/27/2022)   PRAPARE - Administrator, Civil Service (Medical): Yes    Lack of Transportation (Non-Medical): Yes  Physical Activity: Not on file  Stress: Not on file  Social Connections: Unknown (10/10/2022)   Received from [employer]   Social Network    Social Network: Not on file   Additional Social History: Homelessness    Allergies:  No Known Allergies  Labs:  Results for orders placed or performed during the hospital encounter of 11/17/22 (from the past 48 hour(s))  Comprehensive metabolic panel     Status: None   Collection Time: 11/17/22  7:56 AM  Result Value Ref Range   Sodium 139 135 - 145 mmol/L   Potassium 4.1 3.5 - 5.1 mmol/L   Chloride 100 98 - 111 mmol/L   CO2 29 22 - 32 mmol/L   Glucose, Bld 90 70 - 99 mg/dL    Comment: Glucose reference range applies only to samples taken after fasting for at least 8 hours.   BUN 10 6 - 20 mg/dL   Creatinine, Ser 3.22 0.61 - 1.24 mg/dL   Calcium 9.0 8.9 - 02.5 mg/dL   Total Protein 8.1 6.5 - 8.1 g/dL   Albumin 4.6 3.5 - 5.0 g/dL   AST 22 15 - 41 U/L   ALT 20 0 - 44 U/L   Alkaline Phosphatase 61 38 - 126 U/L   Total Bilirubin 1.1 <1.2 mg/dL   GFR, Estimated >42 >70 mL/min    Comment: (NOTE) Calculated using the CKD-EPI Creatinine Equation (2021)    Anion gap 10 5 - 15    Comment: Performed at [facility], [address] [place], [place] [address]   Ethanol     Status: None   Collection Time: 11/17/22  7:56 AM  Result Value Ref Range   Alcohol, Ethyl (B) <10 <10 mg/dL    Comment: (NOTE) Lowest detectable limit for serum alcohol is 10 mg/dL.  For medical purposes only. Performed at [facility], [address] [place], [place] [address]   Salicylate level     Status: Abnormal   Collection Time: 11/17/22  7:56 AM  Result Value Ref Range   Salicylate  Lvl <7.0 (L) 7.0 - 30.0 mg/dL    Comment: Performed at [facility] [place], [address] [place], [place] [address]  Acetaminophen level     Status: Abnormal   Collection Time: 11/17/22  7:56 AM  Result Value Ref Range   Acetaminophen (Tylenol), Serum <10 (L) 10 - 30 ug/mL    Comment: (NOTE) Therapeutic concentrations vary significantly. A range of 10-30 ug/mL  may be an effective concentration for many patients. However, some  are best treated at concentrations outside of this range. Acetaminophen concentrations >150 ug/mL at 4 hours after ingestion  and >50 ug/mL at 12 hours after ingestion are often associated with  toxic reactions.  Performed at [facility], [address] [place], [place] [address]   cbc     Status: None   Collection Time: 11/17/22  7:56 AM  Result Value Ref Range   WBC 7.6 4.0 - 10.5 K/uL   RBC 5.07 4.22 - 5.81 MIL/uL   Hemoglobin 15.2 13.0 - 17.0 g/dL   HCT 57.8 46.9 - 62.9 %   MCV 92.3 80.0 - 100.0 fL   MCH 30.0 26.0 - 34.0 pg   MCHC 32.5 30.0 - 36.0 g/dL   RDW 52.8 41.3 - 24.4 %   Platelets 345 150 - 400 K/uL   nRBC 0.0 0.0 - 0.2 %    Comment: Performed at [facility], [address] [place], [place] [address]  Urine Drug Screen, Qualitative     Status: Abnormal   Collection Time: 11/17/22  7:56 AM  Result Value Ref Range   Tricyclic, Ur Screen NONE DETECTED NONE DETECTED   Amphetamines, Ur Screen NONE DETECTED NONE DETECTED   MDMA (Ecstasy)Ur Screen NONE DETECTED NONE DETECTED   Cocaine  Metabolite,Ur [place] POSITIVE (A) NONE DETECTED   Opiate, Ur Screen NONE DETECTED NONE DETECTED   Phencyclidine (PCP) Ur S NONE DETECTED NONE DETECTED   Cannabinoid 50 Ng, Ur [place] POSITIVE (A) NONE DETECTED   Barbiturates, Ur Screen NONE DETECTED NONE DETECTED   Benzodiazepine, Ur Scrn POSITIVE (A) NONE DETECTED   Methadone Scn, Ur NONE DETECTED NONE DETECTED    Comment: (NOTE) Tricyclics + metabolites, urine    Cutoff 1000 ng/mL Amphetamines + metabolites, urine  Cutoff 1000 ng/mL MDMA (Ecstasy), urine              Cutoff 500 ng/mL Cocaine Metabolite, urine          Cutoff 300 ng/mL Opiate + metabolites, urine        Cutoff 300 ng/mL Phencyclidine (PCP), urine         Cutoff 25 ng/mL Cannabinoid, urine                 Cutoff 50 ng/mL Barbiturates + metabolites, urine  Cutoff 200 ng/mL Benzodiazepine, urine              Cutoff 200 ng/mL Methadone, urine                   Cutoff 300 ng/mL  The urine drug screen provides only a preliminary, unconfirmed analytical test result and should not be used for non-medical purposes. Clinical consideration and professional judgment should be applied to any positive drug screen result due to possible interfering substances. A more specific alternate chemical method must be used in order to obtain a confirmed analytical result. Gas chromatography / mass spectrometry (GC/MS) is the preferred confirm atory method. Performed at [facility], [address], [place], [place]  [address]     No current facility-administered medications for this encounter.   No current outpatient medications on file.   Facility-Administered Medications Ordered in Other Encounters  Medication Dose Route Frequency Provider Last Rate Last Admin   acetaminophen (TYLENOL) tablet 650 mg  650 mg Oral Q6H PRN Diahann Guajardo, NP       alum & mag hydroxide-simeth (MAALOX/MYLANTA) 200-200-20 MG/5ML suspension 30 mL  30 mL Oral Q4H PRN Jahron Hunsinger, NP       diphenhydrAMINE  (BENADRYL) capsule 50 mg  50 mg Oral TID PRN Mcneil Sober, NP       Or   diphenhydrAMINE (BENADRYL) injection 50 mg  50 mg Intramuscular TID PRN Yuridia Couts, NP       haloperidol (HALDOL) tablet 5 mg  5 mg Oral TID PRN Mcneil Sober, NP       Or   haloperidol lactate (HALDOL) injection 5 mg  5 mg Intramuscular TID PRN Adryanna Friedt, [place] Neighbor, NP       LORazepam (ATIVAN) tablet 2 mg  2 mg Oral TID PRN Mcneil Sober, NP       Or   LORazepam (ATIVAN) injection 2 mg  2 mg Intramuscular TID PRN Brihanna Devenport, NP       magnesium hydroxide (MILK OF MAGNESIA) suspension 30 mL  30 mL Oral Daily PRN Mcneil Sober, NP        Musculoskeletal: Strength & Muscle Tone: Not assessed Gait & Station: Not assessed during visit Patient leans: N/A            Psychiatric Specialty Exam:  Presentation  General Appearance:  Disheveled  Eye Contact: Minimal  Speech: Clear and Coherent  Speech Volume: Decreased  Handedness: Right   Mood and Affect  Mood: Dysphoric; Hopeless; Depressed; Worthless  Affect: Depressed   Thought Process  Thought Processes: Goal Directed  Descriptions of Associations:Intact  Orientation:Full (Time, Place and Person)  Thought Content:Illogical  History of Schizophrenia/Schizoaffective disorder:No  Duration of Psychotic Symptoms:Greater than six months  Hallucinations:Hallucinations: None  Ideas of Reference:None  Suicidal Thoughts:Suicidal Thoughts: Yes, Active SI Active Intent and/or Plan: With Intent; With Plan; With Access to Means  Homicidal Thoughts:Homicidal Thoughts: No   Sensorium  Memory: Immediate Good; Recent Good; Remote Good  Judgment: Impaired  Insight: Fair   Art therapist  Concentration: Fair  Attention Span: Fair  Recall: Good  Fund of Knowledge: Good  Language: Good   Psychomotor Activity  Psychomotor Activity:Psychomotor Activity: Normal   Assets  Assets: Communication Skills; Desire for  Improvement   Sleep  Sleep:Sleep: Poor   Physical Exam: Physical Exam Vitals and nursing note reviewed.  Neurological:     Mental Status: He is alert and oriented to person, place, and time.    Review of Systems  Psychiatric/Behavioral:  Positive for depression, substance abuse and suicidal ideas.   All other systems reviewed and are negative.  Blood pressure (!) 155/90, pulse 65, temperature 97.6 F (36.4 C), temperature source Oral, resp. rate 17, height 5\' 10"  (1.778 m), weight 70 kg, SpO2 100%. Body mass index is 22.14 kg/m.  Treatment Plan Summary: Patient noted with a history of major depressive disorder, severe, presenting to [facility] ED with suicidal ideations with active plan and intent.  He reports recent medication noncompliance, cocaine and alcohol use. He expresses the desire for improvement and is agreeing to an inpatient psychiatric admission.  Disposition: Recommend psychiatric Inpatient admission when medically cleared.  EDP and team made aware of disposition.  Mcneil Sober, NP 11/17/2022  5:01 PM

## 2022-11-17 NOTE — ED Triage Notes (Signed)
[facility] [place] [facility] [place] for help. Pt reports having thoughts of su cide over the past 2 weeks, increasing over the last few days with thoughts of walking out in front of a train. Reports recent hospital visit addressing this. Pt reports right sided flank pain with no known injury. Pt alert and oriented X4, cooperative, RR even and unlabored, color WNL. Pt in NAD. Denies HI. Contracts for safety while in hospital. Denies SI attempts.

## 2022-11-17 NOTE — Tx Team (Signed)
Initial Treatment Plan 11/17/2022 5:30 PM Franklin [name] WGN:[id number]    PATIENT STRESSORS: Financial difficulties   Medication change or noncompliance   Substance abuse     PATIENT STRENGTHS: Capable of independent living  Work skills    PATIENT IDENTIFIED PROBLEMS: Depression  Substance abuse                   DISCHARGE CRITERIA:  Improved stabilization in mood, thinking, and/or behavior Verbal commitment to aftercare and medication compliance  PRELIMINARY DISCHARGE PLAN: Placement in alternative living arrangements  PATIENT/FAMILY INVOLVEMENT: This treatment plan has been presented to and reviewed with the patient, Franklin [name]. The patient has been given the opportunity to ask questions and make suggestions.  Roseanne Reno, RN 11/17/2022, 5:30 PM

## 2022-11-17 NOTE — ED Notes (Signed)
Dressed into behavioral scrubs by RN and ED [place]. 2 bags of belongings appropriately labeled with patient name that include orange sandals, grey hospital socks, jeans, belt, burgundy paper scrub shirt, long sleeve shirt, jacket, and blanket.

## 2022-11-17 NOTE — Plan of Care (Signed)
Patient has not not had time progress. Problem: Education: Goal: Knowledge of [place] [facility] Education information/materials will improve Outcome: Not Progressing Goal: Emotional status will improve Outcome: Not Progressing Goal: Mental status will improve Outcome: Not Progressing Goal: Verbalization of understanding the information provided will improve Outcome: Not Progressing   Problem: Activity: Goal: Interest or engagement in activities will improve Outcome: Not Progressing Goal: Sleeping patterns will improve Outcome: Not Progressing   Problem: Health Behavior/Discharge Planning: Goal: Identification of resources available to assist in meeting health care needs will improve Outcome: Not Progressing Goal: Compliance with treatment plan for underlying cause of condition will improve Outcome: Not Progressing

## 2022-11-17 NOTE — BH Assessment (Signed)
Comprehensive Clinical Assessment (CCA) Note  11/17/2022 Franklin [name] [id number]  Chief Complaint:  Chief Complaint  Patient presents with   Suicidal   Visit Diagnosis: Major Depression   Franklin [name] is a 52 year old male who presents to the ER due to having thoughts of ending his life by getting hit by a train. Patient states he has felt depressed one to two weeks. Patient reports of having a pass suicide attempt by hanging his self.  During the interview, the patient was calm, cooperative and pleasant. He was able to provide appropriate answers to the questions. Throughout he denied HI and AV/H. He admits to the use of alcohol, cocaine and cannabis.  CCA Screening, Triage and Referral (STR)  Patient Reported Information How did you hear about [place]? Self  What Is the Reason for Your Visit/Call Today? Patient reports of having thoughts of ending his life.  How Long Has This Been Causing You Problems? 1 wk - 1 month  What Do You Feel Would Help You the Most Today? Treatment for Depression or other mood problem; Alcohol or Drug Use Treatment   Have You Recently Had Any Thoughts About Hurting Yourself? Yes  Are You Planning to Commit Suicide/Harm Yourself At This time? Yes   Flowsheet Row ED from 11/17/2022 in [facility] Emergency Department at [facility] ED from 11/13/2022 in [facility] Emergency Department at [facility] Admission (Discharged) from 10/27/2022 in [facility] INPATIENT BEHAVIORAL MEDICINE  C-SSRS RISK CATEGORY High Risk High Risk No Risk       Have you Recently Had Thoughts About Hurting Someone [name]? No  Are You Planning to Harm Someone at This Time? No  Explanation: N/A   Have You Used Any Alcohol or Drugs in the Past 24 Hours? Yes  What Did You Use and How Much? Cocaine, cannabis and alcohol   Do You Currently Have a Therapist/Psychiatrist? No  Name of Therapist/Psychiatrist:    Have You Been Recently Discharged From Any Office  Practice or Programs? No  Explanation of Discharge From Practice/Program: [facility] on 05/11/2022     CCA Screening Triage Referral Assessment Type of Contact: Face-to-Face  Telemedicine Service Delivery:   Is this Initial or Reassessment?   Date Telepsych consult ordered in [facility]:    Time Telepsych consult ordered in [facility]:    Location of Assessment: [facility] ED  Provider Location: [facility] ED   Collateral Involvement: None   Does Patient Have a Court Appointed Legal Guardian? No  Legal Guardian Contact Information: N/A  Copy of Legal Guardianship Form: -- (N/A)  Legal Guardian Notified of Arrival: -- (N/A)  Legal Guardian Notified of Pending Discharge: -- (N/A)  If Minor and Not Living with Parent(s), Who has Custody? N/A  Is CPS involved or ever been involved? Never  Is APS involved or ever been involved? Never   Patient Determined To Be At Risk for Harm To Self or Others Based on Review of Patient Reported Information or Presenting Complaint? Yes, for Self-Harm  Method: No Plan (denies HI)  Availability of Means: No access or NA (denies HI)  Intent: Vague intent or NA (denies HI)  Notification Required: No need or identified person (denies HI)  Additional Information for Danger to Others Potential: Previous attempts (In 2012 or 2013)  Additional Comments for Danger to Others Potential: N/A  Are There Guns or Other Weapons in Your Home? No  Types of Guns/Weapons: N/A  Are These Weapons Safely Secured?                            -- (  N/A)  Who Could Verify You Are Able To Have These Secured: N/A  Do You Have any Outstanding Charges, Pending Court Dates, Parole/Probation? Patient denies  Contacted To Inform of Risk of Harm To Self or Others: -- (N/A)    Does Patient Present under Involuntary Commitment? No    [place] of Residence: [place]   Patient Currently Receiving the Following Services: Not Receiving Services   Determination of Need: Emergent (2  hours)   Options For Referral: Inpatient Hospitalization; ED Visit   CCA Biopsychosocial Patient Reported Schizophrenia/Schizoaffective Diagnosis in Past: No  Strengths: Have some insight, seeking help and polite.  Mental Health Symptoms Depression:   Difficulty Concentrating; Hopelessness; Worthlessness   Duration of Depressive symptoms:  Duration of Depressive Symptoms: Greater than two weeks   Mania:   Recklessness; Change in energy/activity   Anxiety:    N/A   Psychosis:   Hallucinations   Duration of Psychotic symptoms:    Trauma:   N/A   Obsessions:   N/A   Compulsions:   N/A   Inattention:   None   Hyperactivity/Impulsivity:   N/A   Oppositional/Defiant Behaviors:   N/A   Emotional Irregularity:   N/A   Other Mood/Personality Symptoms:   N/A    Mental Status Exam Appearance and self-care  Stature:   Average   Weight:   Average weight   Clothing:   Neat/clean; Age-appropriate   Grooming:   Normal   Cosmetic use:   None   Posture/gait:   Normal   Motor activity:   -- (Within normal range)   Sensorium  Attention:   Normal   Concentration:   Normal   Orientation:   X5   Recall/memory:   Normal   Affect and Mood  Affect:   Appropriate; Depressed   Mood:   Depressed   Relating  Eye contact:   Normal   Facial expression:   Depressed   Attitude toward examiner:   Cooperative   Thought and Language  Speech flow:  Clear and Coherent   Thought content:   Appropriate to Mood and Circumstances   Preoccupation:   None   Hallucinations:   None   Organization:   Coherent; Intact   [employer] of Knowledge:   Fair   Intelligence:   Average   Abstraction:   Functional   Judgement:   Fair; Impaired   Reality Testing:   Realistic   Insight:   Fair   Decision Making:   Normal   Social Functioning  Social Maturity:   Isolates   Social Judgement:   Naive; "Chief of Staff";  Heedless   Stress  Stressors:   Surveyor, quantity; Housing   Coping Ability:   Exhausted   Skill Deficits:   None   Supports:   Support needed     Religion: Religion/Spirituality Are You A Religious Person?: No  Leisure/Recreation: Leisure / Recreation Do You Have Hobbies?: No  Exercise/Diet: Exercise/Diet Do You Exercise?: No Have You Gained or Lost A Significant Amount of Weight in the Past Six Months?: No Do You Follow a Special Diet?: No Do You Have Any Trouble Sleeping?: No   CCA Employment/Education Employment/Work Situation: Employment / Work Situation Employment Situation: Unemployed Patient's Job has Been Impacted by Current Illness: No Has Patient ever Been in Equities trader?: No  Education: Education Is Patient Currently Attending School?: No Did You Product manager?: No Did You Have An Individualized Education Program (IIEP): No Did You Have Any Difficulty At [employer]?: No  Patient's Education Has Been Impacted by Current Illness: No  CCA Family/Childhood History Family and Relationship History: Family history Marital status: Single Does patient have children?: No  Childhood History:  Childhood History By whom was/is the patient raised?: Mother Did patient suffer any verbal/emotional/physical/sexual abuse as a child?: No Did patient suffer from severe childhood neglect?: No Has patient ever been sexually abused/assaulted/raped as an adolescent or adult?: No Was the patient ever a victim of a crime or a disaster?: No Witnessed domestic violence?: No Has patient been affected by domestic violence as an adult?: No  CCA Substance Use Alcohol/Drug Use: Alcohol / Drug Use Pain Medications: See MAR Prescriptions: See MAR Over the Counter: See MAR History of alcohol / drug use?: Yes Longest period of sobriety (when/how long): Unable to quantify Substance #1 Name of Substance 1: Cocaine Substance #2 Name of Substance 2: Cannabis Substance #3 Name of  Substance 3: Alcohol  ASAM's:  Six Dimensions of Multidimensional Assessment  Dimension 1:  Acute Intoxication and/or Withdrawal Potential:      Dimension 2:  Biomedical Conditions and Complications:      Dimension 3:  Emotional, Behavioral, or Cognitive Conditions and Complications:     Dimension 4:  Readiness to Change:     Dimension 5:  Relapse, Continued use, or Continued Problem Potential:     Dimension 6:  Recovery/Living Environment:     ASAM Severity Score:    ASAM Recommended Level of Treatment:     Substance use Disorder (SUD)    Recommendations for Services/Supports/Treatments:    Discharge Disposition:    DSM5 Diagnoses: Patient Active Problem List   Diagnosis Date Noted   Major depressive disorder, recurrent severe without psychotic features (HCC) 10/27/2022   Nonspecific chest pain 10/26/2022   Cocaine abuse (HCC) 10/24/2022   COPD (chronic obstructive pulmonary disease) (HCC) 10/22/2022   Alcohol withdrawal (HCC) 10/22/2022   PVC (premature ventricular contraction) 07/13/2022   Solitary pulmonary nodule 07/13/2022   Prediabetes 07/13/2022   Abnormal echocardiogram 07/13/2022   Chronic obstructive pulmonary disease (HCC) 06/18/2022   COPD with acute exacerbation (HCC) 05/28/2022   COPD exacerbation (HCC) 05/24/2022   Aspiration pneumonia (HCC) 05/24/2022   Alcohol abuse with alcohol-induced mood disorder (HCC) 02/02/2016   Generalized anxiety disorder    Substance abuse (HCC) 03/22/2015   Tobacco abuse 10/06/2014   Major depressive disorder, recurrent episode, severe, with psychotic behavior (HCC) 10/05/2014   Cocaine use disorder, severe, dependence (HCC) 10/02/2014   Major depressive disorder, recurrent, severe without psychotic features (HCC)    Alcohol use disorder, severe, dependence (HCC) 06/18/2014   Substance induced mood disorder (HCC) 03/14/2013   Bipolar I disorder, most recent episode depressed (HCC) 04/08/2012   Polysubstance abuse (HCC)  04/08/2012   Suicidal ideation 04/08/2012    Referrals to Alternative Service(s): Referred to Alternative Service(s):   Place:   Date:   Time:    Referred to Alternative Service(s):   Place:   Date:   Time:    Referred to Alternative Service(s):   Place:   Date:   Time:    Referred to Alternative Service(s):   Place:   Date:   Time:     [name] MS, LCAS, [facility], [facility] Therapeutic Triage Specialist 11/17/2022 12:34 PM

## 2022-11-17 NOTE — ED Provider Notes (Addendum)
[facility] Provider Note   None    (approximate) History  Suicidal  HPI Franklin [name] is a 51 y.o. male with a past medical history of polysubstance abuse, bipolar disorder, COPD, generalized anxiety disorder, and major depressive disorder who presents complaining of suicidal ideation with a plan to jump in front of a train.  Patient also complaining of of right lower lumbar back pain without any complaints of pain radiating down the buttock or leg. ROS: Patient currently denies any vision changes, tinnitus, difficulty speaking, facial droop, sore throat, chest pain, shortness of breath, abdominal pain, nausea/vomiting/diarrhea, dysuria, or weakness/numbness/paresthesias in any extremity   Physical Exam  Triage Vital Signs: ED Triage Vitals  Encounter Vitals Group     BP      Systolic BP Percentile      Diastolic BP Percentile      Pulse      Resp      Temp      Temp src      SpO2      Weight      Height      Head Circumference      Peak Flow      Pain Score      Pain Loc      Pain Education      Exclude from Growth Chart    Most recent vital signs: Vitals:   11/17/22 0804  BP: (!) 155/97  Pulse: 62  Resp: 16  Temp: 97.6 F (36.4 C)  SpO2: 100%   General: Awake, oriented x4. CV:  Good peripheral perfusion.  Resp:  Normal effort.  Abd:  No distention.  Other:  Middle-aged overweight African-American male resting comfortably in no acute distress.  Tenderness with associated muscular spasm over the right lower lumbar paraspinal musculature ED Results / Procedures / Treatments  Labs (all labs ordered are listed, but only abnormal results are displayed) Labs Reviewed  SALICYLATE LEVEL - Abnormal; Notable for the following components:      Result Value   Salicylate Lvl <7.0 (*)    All other components within normal limits  ACETAMINOPHEN LEVEL - Abnormal; Notable for the following components:   Acetaminophen (Tylenol), Serum <10 (*)    All  other components within normal limits  URINE DRUG SCREEN, QUALITATIVE ([facility] ONLY) - Abnormal; Notable for the following components:   Cocaine Metabolite,Ur [place] POSITIVE (*)    Cannabinoid 50 Ng, Ur [place] POSITIVE (*)    Benzodiazepine, Ur [place] POSITIVE (*)    All other components within normal limits  COMPREHENSIVE METABOLIC PANEL  ETHANOL  CBC   PROCEDURES: Critical Care performed: No Procedures MEDICATIONS ORDERED IN ED: Medications  ibuprofen (ADVIL) tablet 600 mg (600 mg Oral Given 11/17/22 0800)   IMPRESSION / MDM / ASSESSMENT AND PLAN / ED COURSE  I reviewed the triage vital signs and the nursing notes.                             The patient is on the cardiac monitor to evaluate for evidence of arrhythmia and/or significant heart rate changes. Patient's presentation is most consistent with acute presentation with potential threat to life or bodily function. Thoughts are linear and organized, and patient has no AH, VH, or HI. Prior suicide attempt: Overdose Prior Psychiatric Hospitalizations: Multiple  Clinically patient displays no overt toxidrome; they are well appearing, with low suspicion for toxic ingestion given history and exam. Thoughts  unlikely 2/2 anemia, hypothyroidism, infection, or ICH. Patient is medically cleared for psychiatric admission Consult: Psychiatry to evaluate patient for potential hold for danger to self. Disposition: Plan admit to psychiatry for further management of symptoms.    FINAL CLINICAL IMPRESSION(S) / ED DIAGNOSES   Final diagnoses:  Suicidal ideation   Rx / DC Orders   ED Discharge Orders     None      Note:  This document was prepared using Dragon voice recognition software and may include unintentional dictation errors.   Merwyn Katos, MD 11/17/22 1159    Merwyn Katos, MD 11/17/22 [phone]

## 2022-11-17 NOTE — OR Nursing (Signed)
D- Patient alert and oriented. Patient presented in a pleasant mood . Denies SI, HI, AVH, and pain.  A- Scheduled medications administered to patient, per MD orders. Support and encouragement provided.  Routine safety checks conducted every 15 minutes.  Patient informed to notify staff with problems or concerns. R- No adverse drug reactions noted. Patient contracts for safety at this time. Patient compliant with medications and treatment plan. Patient receptive, calm, and cooperative. Patient interacts well with others on the unit.  Patient remains safe at this time.

## 2022-11-18 DIAGNOSIS — F322 Major depressive disorder, single episode, severe without psychotic features: Secondary | ICD-10-CM

## 2022-11-18 MED ORDER — NICOTINE POLACRILEX 2 MG MT GUM: 2.0000 mg | CHEWING_GUM | OROMUCOSAL | Status: DC | PRN

## 2022-11-18 MED ORDER — BENZTROPINE MESYLATE 1 MG PO TABS
1.0000 mg | ORAL_TABLET | Freq: Every day | ORAL | Status: DC
  Administered 2022-11-18 – 2022-11-24 (×7): 1 mg via ORAL
  Filled 2022-11-18 (×7): qty 1

## 2022-11-18 MED ORDER — LORAZEPAM 1 MG PO TABS
1.0000 mg | ORAL_TABLET | Freq: Once | ORAL | Status: AC
  Administered 2022-11-18: 1 mg via ORAL
  Filled 2022-11-18: qty 1

## 2022-11-18 MED ORDER — ENSURE ENLIVE PO LIQD
1.0000 | Freq: Two times a day (BID) | ORAL | Status: DC
  Administered 2022-11-19 – 2022-11-22 (×7): 237 mL via ORAL

## 2022-11-18 MED ORDER — RISPERIDONE 1 MG PO TBDP
0.5000 mg | ORAL_TABLET | Freq: Every day | ORAL | Status: DC
  Administered 2022-11-18: 0.5 mg via ORAL
  Filled 2022-11-18: qty 1

## 2022-11-18 NOTE — Group Note (Signed)
LCSW Group Therapy Note  Group Date: 11/18/2022 Start Time: 1100 End Time: 1200   Type of Therapy and Topic:  Group Therapy - Healthy vs Unhealthy Coping Skills  Participation Level:  Did Not Attend   Description of Group The focus of this group was to determine what unhealthy coping techniques typically are used by group members and what healthy coping techniques would be helpful in coping with various problems. Patients were guided in becoming aware of the differences between healthy and unhealthy coping techniques. Patients were asked to identify 2-3 healthy coping skills they would like to learn to use more effectively.  Therapeutic Goals Patients learned that coping is what human beings do all day long to deal with various situations in their lives Patients defined and discussed healthy vs unhealthy coping techniques Patients identified their preferred coping techniques and identified whether these were healthy or unhealthy Patients determined 2-3 healthy coping skills they would like to become more familiar with and use more often. Patients provided support and ideas to each other   Summary of Patient Progress:  Did not attend   Therapeutic Modalities Cognitive Behavioral Therapy Motivational Interviewing  Marinda Elk, [place] 11/18/2022  1:37 PM

## 2022-11-18 NOTE — Group Note (Signed)
Date:  11/18/2022 Time:  8:59 PM  Group Topic/Focus:  Wrap-Up Group:   The focus of this group is to help patients review their daily goal of treatment and discuss progress on daily workbooks.    Participation Level:  Did Not Attend  Participation Quality:   none  Affect:   none  Cognitive:   none  Insight: None  Engagement in Group:   none  Modes of Intervention:   none  Additional Comments:  none  Belva Crome 11/18/2022, 8:59 PM

## 2022-11-18 NOTE — H&P (Signed)
Psychiatric Admission Assessment Adult  Patient Identification: Franklin [name] MRN:  [id number] Date of Evaluation:  11/18/2022 Chief Complaint:  MDD (major depressive disorder) [F32.9] Principal Diagnosis: MDD (major depressive disorder) Diagnosis:  Principal Problem:   MDD (major depressive disorder)  History of Present Illness: 52 year old male admitted for active suicidal ideations. The patient reports that he was [place] train tracks [place], planning to walk in front of an oncoming train to end his life. He attributes his current suicidal ideation to unresolved grief and distress over the death of his son three years ago.The patient has a psychiatric history of major depressive disorder, generalized anxiety disorder, and polysubstance use disorder. He has experienced multiple psychiatric hospitalizations, with the most recent discharge from [facility] on November 01, 2022. After his last hospitalization, he was adherent to his medications for only one week before discontinuing them and relapsing into crack cocaine and alcohol use.He reports using both crack cocaine and alcohol last night. Despite his relapse, the patient expresses a strong desire to engage in substance abuse treatment and improve his mental health. He denies any history of suicidal attempts prior to this admission.The patient denies homicidal ideations, auditory or visual hallucinations, delusions, or paranoia. He acknowledges experiencing tremors in his head and hands, along with feelings of agitation, likely related to substance use.The patient reports being willing to transition to inpatient psychiatric care for further evaluation and treatment. Associated Signs/Symptoms: Depression Symptoms:  depressed mood, psychomotor agitation, feelings of worthlessness/guilt, difficulty concentrating, hopelessness, suicidal thoughts with specific plan, suicidal attempt, anxiety, weight loss, decreased appetite, (Hypo) Manic Symptoms:   Impulsivity, Anxiety Symptoms:   denies Psychotic Symptoms:   none PTSD Symptoms: Negative Total Time spent with patient: 2.5 hours  Past Psychiatric History: Depression Polysubstance Abuse   Is the patient at risk to self? Yes.    Has the patient been a risk to self in the past 6 months? Yes.    Has the patient been a risk to self within the distant past? Yes.    Is the patient a risk to others? No.  Has the patient been a risk to others in the past 6 months? No.  Has the patient been a risk to others within the distant past? No.   [place] Scale:  Flowsheet Row Admission (Current) from 11/17/2022 in [facility] INPATIENT BEHAVIORAL MEDICINE Most recent reading at 11/17/2022  4:49 PM ED from 11/17/2022 in [facility] Emergency Department at [facility] Most recent reading at 11/17/2022 11:46 AM ED from 11/13/2022 in [facility] Emergency Department at [facility] Most recent reading at 11/13/2022 11:09 AM  C-SSRS RISK CATEGORY Moderate Risk High Risk High Risk        Prior Inpatient Therapy: Yes.   If yes, describe previous hospitalization  Prior Outpatient Therapy: Yes.   If yes, describe outpatient    Alcohol Screening: 1. How often do you have a drink containing alcohol?: 4 or more times a week 2. How many drinks containing alcohol do you have on a typical day when you are drinking?: 10 or more 3. How often do you have six or more drinks on one occasion?: Daily or almost daily AUDIT-C Score: 12 4. How often during the last year have you found that you were not able to stop drinking once you had started?: Never 5. How often during the last year have you failed to do what was normally expected from you because of drinking?: Less than monthly 6. How often during the last year have you  needed a first drink in the morning to get yourself going after a heavy drinking session?: Less than monthly 7. How often during the last year have you had a feeling of guilt of remorse after  drinking?: Never 8. How often during the last year have you been unable to remember what happened the night before because you had been drinking?: Never 9. Have you or someone else been injured as a result of your drinking?: No 10. Has a relative or friend or a doctor or another health worker been concerned about your drinking or suggested you cut down?: No Alcohol Use Disorder Identification Test Final Score (AUDIT): 14 Alcohol Brief Interventions/Follow-up: Patient Refused Substance Abuse History in the last 12 months:  Yes.   Consequences of Substance Abuse: Medical Consequences:  head tremors Withdrawal Symptoms:   Tremors Previous Psychotropic Medications: Yes  Psychological Evaluations: No  Past Medical History:  Past Medical History:  Diagnosis Date   Anxiety    Bipolar 1 disorder (HCC)    COPD (chronic obstructive pulmonary disease) (HCC)    Depression    Schizophrenia (HCC)    Substance abuse (HCC)     Past Surgical History:  Procedure Laterality Date   NO PAST SURGERIES     Family History:  Family History  Problem Relation Age of Onset   Hypertension Other    Diabetes Other    Family Psychiatric  History: none reported Tobacco Screening:  Social History   Tobacco Use  Smoking Status Some Days   Current packs/day: 0.50   Types: Cigarettes  Smokeless Tobacco Never    BH Tobacco Counseling     Are you interested in Tobacco Cessation Medications?  N/A, patient does not use tobacco products Counseled patient on smoking cessation:  N/A, patient does not use tobacco products Reason Tobacco Screening Not Completed: No value filed.       Social History:  Social History   Substance and Sexual Activity  Alcohol Use Yes   Alcohol/week: 12.0 standard drinks of alcohol   Types: 12 Cans of beer per week     Social History   Substance and Sexual Activity  Drug Use Yes   Types: "Crack" cocaine, Cocaine   Comment: one gram daily    Additional Social  History: Marital status: Single Are you sexually active?: No What is your sexual orientation?: "straight" Has your sexual activity been affected by drugs, alcohol, medication, or emotional stress?: Pt denies. Does patient have children?: No                         Allergies:  No Known Allergies Lab Results:  Results for orders placed or performed during the hospital encounter of 11/17/22 (from the past 48 hour(s))  Comprehensive metabolic panel     Status: None   Collection Time: 11/17/22  7:56 AM  Result Value Ref Range   Sodium 139 135 - 145 mmol/L   Potassium 4.1 3.5 - 5.1 mmol/L   Chloride 100 98 - 111 mmol/L   CO2 29 22 - 32 mmol/L   Glucose, Bld 90 70 - 99 mg/dL    Comment: Glucose reference range applies only to samples taken after fasting for at least 8 hours.   BUN 10 6 - 20 mg/dL   Creatinine, Ser 6.38 0.61 - 1.24 mg/dL   Calcium 9.0 8.9 - 75.6 mg/dL   Total Protein 8.1 6.5 - 8.1 g/dL   Albumin 4.6 3.5 - 5.0 g/dL  AST 22 15 - 41 U/L   ALT 20 0 - 44 U/L   Alkaline Phosphatase 61 38 - 126 U/L   Total Bilirubin 1.1 <1.2 mg/dL   GFR, Estimated >95 >63 mL/min    Comment: (NOTE) Calculated using the CKD-EPI Creatinine Equation (2021)    Anion gap 10 5 - 15    Comment: Performed at [facility], [address] [place], [place] [address]  Ethanol     Status: None   Collection Time: 11/17/22  7:56 AM  Result Value Ref Range   Alcohol, Ethyl (B) <10 <10 mg/dL    Comment: (NOTE) Lowest detectable limit for serum alcohol is 10 mg/dL.  For medical purposes only. Performed at [facility], [address] [place], [place] [address]   Salicylate level     Status: Abnormal   Collection Time: 11/17/22  7:56 AM  Result Value Ref Range   Salicylate Lvl <7.0 (L) 7.0 - 30.0 mg/dL    Comment: Performed at [facility], [address] [place], [place] [address]  Acetaminophen level     Status: Abnormal   Collection Time: 11/17/22  7:56  AM  Result Value Ref Range   Acetaminophen (Tylenol), Serum <10 (L) 10 - 30 ug/mL    Comment: (NOTE) Therapeutic concentrations vary significantly. A range of 10-30 ug/mL  may be an effective concentration for many patients. However, some  are best treated at concentrations outside of this range. Acetaminophen concentrations >150 ug/mL at 4 hours after ingestion  and >50 ug/mL at 12 hours after ingestion are often associated with  toxic reactions.  Performed at [name], [address] [place], [place] [address]   cbc     Status: None   Collection Time: 11/17/22  7:56 AM  Result Value Ref Range   WBC 7.6 4.0 - 10.5 K/uL   RBC 5.07 4.22 - 5.81 MIL/uL   Hemoglobin 15.2 13.0 - 17.0 g/dL   HCT 01.6 01.0 - 93.2 %   MCV 92.3 80.0 - 100.0 fL   MCH 30.0 26.0 - 34.0 pg   MCHC 32.5 30.0 - 36.0 g/dL   RDW 35.5 73.2 - 20.2 %   Platelets 345 150 - 400 K/uL   nRBC 0.0 0.0 - 0.2 %    Comment: Performed at [facility] [address], [place], [place] [address]  Urine Drug Screen, Qualitative     Status: Abnormal   Collection Time: 11/17/22  7:56 AM  Result Value Ref Range   Tricyclic, Ur Screen NONE DETECTED NONE DETECTED   Amphetamines, Ur Screen NONE DETECTED NONE DETECTED   MDMA (Ecstasy)Ur Screen NONE DETECTED NONE DETECTED   Cocaine Metabolite,Ur [place] POSITIVE (A) NONE DETECTED   Opiate, Ur Screen NONE DETECTED NONE DETECTED   Phencyclidine (PCP) Ur S NONE DETECTED NONE DETECTED   Cannabinoid 50 Ng, Ur [place] POSITIVE (A) NONE DETECTED   Barbiturates, Ur Screen NONE DETECTED NONE DETECTED   Benzodiazepine, Ur Scrn POSITIVE (A) NONE DETECTED   Methadone Scn, Ur NONE DETECTED NONE DETECTED    Comment: (NOTE) Tricyclics + metabolites, urine    Cutoff 1000 ng/mL Amphetamines + metabolites, urine  Cutoff 1000 ng/mL MDMA (Ecstasy), urine              Cutoff 500 ng/mL Cocaine Metabolite, urine          Cutoff 300 ng/mL Opiate + metabolites, urine        Cutoff 300  ng/mL Phencyclidine (PCP), urine  Cutoff 25 ng/mL Cannabinoid, urine                 Cutoff 50 ng/mL Barbiturates + metabolites, urine  Cutoff 200 ng/mL Benzodiazepine, urine              Cutoff 200 ng/mL Methadone, urine                   Cutoff 300 ng/mL  The urine drug screen provides only a preliminary, unconfirmed analytical test result and should not be used for non-medical purposes. Clinical consideration and professional judgment should be applied to any positive drug screen result due to possible interfering substances. A more specific alternate chemical method must be used in order to obtain a confirmed analytical result. Gas chromatography / mass spectrometry (GC/MS) is the preferred confirm atory method. Performed at [facility], [address] [place], [place] [address]     Blood Alcohol level:  Lab Results  Component Value Date   [facility] <10 11/17/2022   ETH <10 11/13/2022    Metabolic Disorder Labs:  Lab Results  Component Value Date   HGBA1C 5.9 (A) 07/12/2022   MPG 117 02/02/2009   No results found for: "PROLACTIN" Lab Results  Component Value Date   CHOL 203 (H) 10/31/2022   TRIG 196 (H) 10/31/2022   HDL 84 10/31/2022   CHOLHDL 2.4 10/31/2022   VLDL 39 10/31/2022   LDLCALC 80 10/31/2022   LDLCALC 63 10/08/2014    Current Medications: Current Facility-Administered Medications  Medication Dose Route Frequency Provider Last Rate Last Admin   acetaminophen (TYLENOL) tablet 650 mg  650 mg Oral Q6H PRN Penn, Cranston Neighbor, NP       alum & mag hydroxide-simeth (MAALOX/MYLANTA) 200-200-20 MG/5ML suspension 30 mL  30 mL Oral Q4H PRN Penn, Cicely, NP       benztropine (COGENTIN) tablet 1 mg  1 mg Oral Daily Myriam Forehand, NP       carvedilol (COREG) tablet 3.125 mg  3.125 mg Oral BID WC Myriam Forehand, NP   3.125 mg at 11/18/22 1711   diphenhydrAMINE (BENADRYL) capsule 50 mg  50 mg Oral TID PRN Mcneil Sober, NP       Or   diphenhydrAMINE (BENADRYL)  injection 50 mg  50 mg Intramuscular TID PRN Mcneil Sober, NP       folic acid (FOLVITE) tablet 1 mg  1 mg Oral Daily Myriam Forehand, NP   1 mg at 11/18/22 0901   haloperidol (HALDOL) tablet 5 mg  5 mg Oral TID PRN Mcneil Sober, NP       Or   haloperidol lactate (HALDOL) injection 5 mg  5 mg Intramuscular TID PRN Mcneil Sober, NP       LORazepam (ATIVAN) tablet 0-4 mg  0-4 mg Oral Q6H Myriam Forehand, NP   2 mg at 11/17/22 2122   Followed by   [name] ON 11/19/2022] LORazepam (ATIVAN) tablet 0-4 mg  0-4 mg Oral Q12H Myriam Forehand, NP       LORazepam (ATIVAN) tablet 1-4 mg  1-4 mg Oral Q1H PRN Myriam Forehand, NP       Or   LORazepam (ATIVAN) tablet 1 mg  1 mg Oral Q1H PRN Myriam Forehand, NP   1 mg at 11/18/22 1838   LORazepam (ATIVAN) tablet 1 mg  1 mg Oral Once Myriam Forehand, NP       magnesium hydroxide (MILK OF MAGNESIA) suspension 30 mL  30 mL Oral Daily PRN Mcneil Sober, NP       multivitamin with minerals tablet 1 tablet  1 tablet Oral Daily Myriam Forehand, NP   1 tablet at 11/18/22 0900   risperiDONE (RISPERDAL M-TABS) disintegrating tablet 0.5 mg  0.5 mg Oral QHS Myriam Forehand, NP       thiamine (VITAMIN B1) tablet 100 mg  100 mg Oral Daily Myriam Forehand, NP   100 mg at 11/18/22 1610   Or   thiamine (VITAMIN B1) injection 100 mg  100 mg Intravenous Daily Myriam Forehand, NP       topiramate (TOPAMAX) tablet 50 mg  50 mg Oral Daily Myriam Forehand, NP   50 mg at 11/18/22 0901   traZODone (DESYREL) tablet 100 mg  100 mg Oral QHS PRN Myriam Forehand, NP       PTA Medications: Medications Prior to Admission  Medication Sig Dispense Refill Last Dose   albuterol (VENTOLIN HFA) 108 (90 Base) MCG/ACT inhaler Inhale 2 puffs into the lungs every 6 (six) hours as needed for wheezing or shortness of breath. 6.7 g 2    carvedilol (COREG) 3.125 MG tablet Take 1 tablet (3.125 mg total) by mouth 2 (two) times daily with a meal. 60 tablet 1    chlordiazePOXIDE (LIBRIUM) 25 MG capsule Take 25 mg by mouth 3 (three)  times daily. (Patient not taking: Reported on 11/17/2022)      FLUoxetine (PROZAC) 20 MG capsule Take 1 capsule (20 mg total) by mouth daily. 30 capsule 3    fluticasone-salmeterol (ADVAIR DISKUS) 250-50 MCG/ACT AEPB Inhale 1 puff into the lungs in the morning and at bedtime. 60 each 1    risperiDONE (RISPERDAL) 0.5 MG tablet Take 1 tablet (0.5 mg total) by mouth 2 (two) times daily at 8 am and 4 pm. 60 tablet 3    traZODone (DESYREL) 100 MG tablet Take 1 tablet (100 mg total) by mouth at bedtime. 30 tablet 3     Musculoskeletal: Strength & Muscle Tone: spastic Gait & Station: broad based Patient leans: N/A            Psychiatric Specialty Exam:  Presentation  General Appearance:  Disheveled  Eye Contact: Poor  Speech: Garbled  Speech Volume: Decreased  Handedness: Right   Mood and Affect  Mood: Depressed; Hopeless  Affect: Flat   Thought Process  Thought Processes: Disorganized  Duration of Psychotic Symptoms:N/A Past Diagnosis of Schizophrenia or Psychoactive disorder: No  Descriptions of Associations:Tangential  Orientation:Partial  Thought Content:WDL  Hallucinations:Hallucinations: None  Ideas of Reference:None  Suicidal Thoughts:Suicidal Thoughts: Yes, Passive SI Active Intent and/or Plan: With Intent; With Plan; Without Means to Carry Out; Without Access to Means SI Passive Intent and/or Plan: With Intent; Without Access to Means  Homicidal Thoughts:Homicidal Thoughts: No   Sensorium  Memory: Immediate Poor; Recent Poor  Judgment: Poor  Insight: Poor   Executive Functions  Concentration: Poor  Attention Span: Poor  Recall: Poor  Fund of Knowledge: Good  Language: Fair   Psychomotor Activity  Psychomotor Activity: Psychomotor Activity: Restlessness; Extrapyramidal Side Effects (EPS) Extrapyramidal Side Effects (EPS): Tardive Dyskinesia AIMS Completed?: No   Assets  Assets: Communication  Skills   Sleep  Sleep: Sleep: Fair Number of Hours of Sleep: 6    Physical Exam: Physical Exam Vitals reviewed.  Constitutional:      Appearance: Normal appearance.  HENT:     Head: Normocephalic and atraumatic.     Nose: Nose  normal.  Pulmonary:     Effort: Pulmonary effort is normal.  Musculoskeletal:        General: Normal range of motion.     Cervical back: Normal range of motion.  Neurological:     General: No focal deficit present.     Mental Status: He is alert. Mental status is at baseline.     Motor: Tremor present.     Gait: Gait abnormal.     Comments: Head and hand tremors  Psychiatric:        Attention and Perception: Perception normal.        Mood and Affect: Mood is anxious and depressed. Affect is blunt and flat.        Speech: Speech is delayed.        Behavior: Behavior is hyperactive.        Thought Content: Thought content includes suicidal ideation. Thought content includes suicidal plan.        Cognition and Memory: Memory is impaired.        Judgment: Judgment is impulsive.    Review of Systems  Neurological:  Positive for tremors.  Psychiatric/Behavioral:  Positive for depression, substance abuse and suicidal ideas. The patient is nervous/anxious.   All other systems reviewed and are negative.  Blood pressure 131/83, pulse 69, temperature 97.8 F (36.6 C), temperature source Oral, resp. rate 18, height 5\' 10"  (1.778 m), weight 67.1 kg, SpO2 100%. Body mass index is 21.24 kg/m.  Treatment Plan Summary: Daily contact with patient to assess and evaluate symptoms and progress in treatment and Medication management Initiate Risperdal 0.5 mg nightly for mood stabilization. Start Cogentin 1 mg daily to address extrapyramidal symptoms or other medication side effects. Implement CIWA protocol to monitor and manage withdrawal symptoms. Administer Ativan as indicated for withdrawal-related agitation or anxiety. Start Coreg 3.25 mg daily for blood  pressure management and cardiovascular stability. Monitor vital signs closely to assess for any complications related to withdrawal or underlying medical issues. Coordinate with LCSW referral to a dual-diagnosis treatment program addressing both mental health and substance use.   Observation Level/Precautions:  Continuous Observation Detox 15 minute checks Seizure  Laboratory:   none   Psychotherapy:    Medications:    Consultations:    Discharge Concerns:    Estimated LOS:  Other:     Physician Treatment Plan for Primary Diagnosis: MDD (major depressive disorder) Long Term Goal(s): Improvement in symptoms so as ready for discharge  Short Term Goals: Ability to identify changes in lifestyle to reduce recurrence of condition will improve, Ability to verbalize feelings will improve, Ability to disclose and discuss suicidal ideas, Ability to demonstrate self-control will improve, Ability to identify and develop effective coping behaviors will improve, Ability to maintain clinical measurements within normal limits will improve, Compliance with prescribed medications will improve, and Ability to identify triggers associated with substance abuse/mental health issues will improve  Physician Treatment Plan for Secondary Diagnosis: Principal Problem:   MDD (major depressive disorder)  Long Term Goal(s): Improvement in symptoms so as ready for discharge  Short Term Goals: Ability to identify changes in lifestyle to reduce recurrence of condition will improve, Ability to verbalize feelings will improve, Ability to disclose and discuss suicidal ideas, Ability to demonstrate self-control will improve, Ability to identify and develop effective coping behaviors will improve, Ability to maintain clinical measurements within normal limits will improve, Compliance with prescribed medications will improve, and Ability to identify triggers associated with substance abuse/mental health issues will improve  I  certify that inpatient services furnished can reasonably be expected to improve the patient's condition.    Myriam Forehand, NP 11/14/20247:17 PM

## 2022-11-18 NOTE — Progress Notes (Signed)
Patient is a voluntary admission to BMU for MDD off his medications and SI thoughts.  Patient has spent most of his day in his room but comes out for meals and medication.  Denies SI, HI, AVH, anxiety or depression.  Is on CIWA with vital signs stable, HR in the 60's.  Held his coreg with morning for borderline bp and hr for a beta blocker.  CIWA has been a 0 for this shift.  Patient does exhibit signs of involuntary muscle movements while walking and facial movements. NP informed and meeting with patient.  Will continue to monitor.

## 2022-11-18 NOTE — [facility] Counselor (Signed)
Adult Comprehensive Assessment  Patient ID: Franklin [name], male   DOB: May 08, 1970, 52 y.o.   MRN: [id number]  Information Source: Information source: Patient  Current Stressors:  Patient states their primary concerns and needs for treatment are:: "alcohol, drugs, suicidal" Patient states their goals for this hospitilization and ongoing recovery are:: "lay off the drugs and alcohol" Educational / Learning stressors: Pt denies. Employment / Job issues: Pt denies. Family Relationships: Pt denies. Financial / Lack of resources (include bankruptcy): Pt denies. Housing / Lack of housing: Pt denies. Physical health (include injuries & life threatening diseases): Pt denies. Social relationships: Pt denies. Substance abuse: "alcohol and crack" Bereavement / Loss: Pt denies.  Living/Environment/Situation:  Living Arrangements: Other (Comment) Living conditions (as described by patient or guardian): "homeless" How long has patient lived in current situation?: "2 months" What is atmosphere in current home: Dangerous, Chaotic, Temporary  Family History:  Marital status: Single Are you sexually active?: No What is your sexual orientation?: "straight" Has your sexual activity been affected by drugs, alcohol, medication, or emotional stress?: Pt denies. Does patient have children?: No  Childhood History:  By whom was/is the patient raised?: Both parents Description of patient's relationship with caregiver when they were a child: "good" Patient's description of current relationship with people who raised him/her: Pt reports that his parents are deceased. How were you disciplined when you got in trouble as a child/adolescent?: "spanking" Does patient have siblings?: Yes Number of Siblings: 8 Description of patient's current relationship with siblings: "doesn't exist" Did patient suffer any verbal/emotional/physical/sexual abuse as a child?: No Did patient suffer from severe childhood neglect?:  No Has patient ever been sexually abused/assaulted/raped as an adolescent or adult?: No Was the patient ever a victim of a crime or a disaster?: No Witnessed domestic violence?: No Has patient been affected by domestic violence as an adult?: No  Education:  Highest grade of school patient has completed: "2nd year of college" Currently a student?: No Learning disability?: No  Employment/Work Situation:   Employment Situation: Unemployed What is the Longest Time Patient has Held a Job?: "3 years" 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?: No  Financial Resources:   Financial resources: No income Does patient have a Lawyer or guardian?: No  Alcohol/Substance Abuse:   What has been your use of drugs/alcohol within the last 12 months?: Alcohol: "daily, a 12 pack"; Crack "daily a gram a day" If attempted suicide, did drugs/alcohol play a role in this?: No Alcohol/Substance Abuse Treatment Hx: Past Tx, Inpatient Has alcohol/substance abuse ever caused legal problems?: No  Social Support System:   Patient's Community Support System: None Describe Community Support System: Pt denies. Type of faith/religion: Pt denies. How does patient's faith help to cope with current illness?: Pt denies.  Leisure/Recreation:   Do You Have Hobbies?: No  Strengths/Needs:   What is the patient's perception of their strengths?: Pt denies. Patient states they can use these personal strengths during their treatment to contribute to their recovery: Pt denies. Patient states these barriers may affect/interfere with their treatment: Pt denies. Patient states these barriers may affect their return to the community: Pt denies. Other important information patient would like considered in planning for their treatment: Pt denies.  Discharge Plan:   Currently receiving community mental health services: No Patient states concerns and  preferences for aftercare planning are: Pt reports that he is seeking residential treatment to address his substance use. Patient states  they will know when they are safe and ready for discharge when: "I don't know" Does patient have access to transportation?: No Does patient have financial barriers related to discharge medications?: No Plan for no access to transportation at discharge: CSW to assist with transportation needs. Plan for living situation after discharge: Pt reports that he is seeking residential treatment. Will patient be returning to same living situation after discharge?: No  Summary/Recommendations:   Summary and Recommendations (to be completed by the evaluator): Patient is a 52 year old male from [place], [place] [facility] [place] Patient presents to the hospital for suicidal ideations.  Initial assessment indicates that patient had a plan to commit suicide via walking in front of a train running on the tracks.  He reports that a trigger to his current mental health state has been his ongoing substance use.  He reports use of alcohol and crack cocaine.  He reports that he is seeking residential treatment to address his substance use.  He also reports that he was triggered by thoughts of his son's death three years ago.  Recommendations include: crisis stabilization, therapeutic milieu, encourage group attendance and participation, medication management for mood stabilization and development of comprehensive mental wellness/sobriety plan.  Harden Mo. 11/18/2022

## 2022-11-18 NOTE — Group Note (Signed)
LCSW Group Therapy Note   Group Date: 11/18/2022 Start Time: 1330 End Time: 1430   Type of Therapy and Topic:  Group Therapy: Challenging Core Beliefs  Participation Level:  Did Not Attend  Description of Group:  Patients were educated about core beliefs and asked to identify one harmful core belief that they have. Patients were asked to explore from where those beliefs originate. Patients were asked to discuss how those beliefs make them feel and the resulting behaviors of those beliefs. They were then be asked if those beliefs are true and, if so, what evidence they have to support them. Lastly, group members were challenged to replace those negative core beliefs with helpful beliefs.   Therapeutic Goals:   1. Patient will identify harmful core beliefs and explore the origins of such beliefs. 2. Patient will identify feelings and behaviors that result from those core beliefs. 3. Patient will discuss whether such beliefs are true. 4.  Patient will replace harmful core beliefs with helpful ones.  Summary of Patient Progress:  Patient did not attend.   Therapeutic Modalities: Cognitive Behavioral Therapy; Solution-Focused Therapy   Lowry Ram, LCSWA 11/18/2022  2:20 PM

## 2022-11-18 NOTE — [facility] Suicide Risk Assessment (Signed)
[facility] INPATIENT:  Family/Significant Other Suicide Prevention Education  Suicide Prevention Education:  Patient Refusal for Family/Significant Other Suicide Prevention Education: The patient Franklin [name] has refused to provide written consent for family/significant other to be provided Family/Significant Other Suicide Prevention Education during admission and/or prior to discharge.  Physician notified.  SPE completed with pt, as pt refused to consent to family contact. SPI pamphlet provided to pt and pt was encouraged to share information with support network, ask questions, and talk about any concerns relating to SPE. Pt denies access to guns/firearms and verbalized understanding of information provided. Mobile Crisis information also provided to pt.   Harden Mo 11/18/2022, 1:24 PM

## 2022-11-18 NOTE — Group Note (Signed)
Recreation Therapy Group Note   Group Topic:Relaxation  Group Date: 11/18/2022 Start Time: 1000 End Time: 1045 Facilitators: Rosina Lowenstein, LRT, CTRS Location:  [facility]  Group Description: PMR (Progressive Muscle Relaxation). LRT asks patients their current level of stress/anxiety from 1-10, with 10 being the highest. LRT educates patients on what PMR is and the benefits that come from it. Patients are asked to sit with their feet flat on the floor while sitting up and all the way back in their chair, if possible. LRT and pts follow a prompt through a speaker that requires you to tense and release different muscles in their body and focus on their breathing. During session, lights are off and soft music is being played. Pts are given a stress ball to use if needed. At the end of the prompt, LRT asks patients to rank their current levels of stress/anxiety from 1-10, 10 being the highest. LRT provides patients with an education handout on PMR.   Goal Area(s) Addressed:  Patients will be able to describe progressive muscle relaxation.  Patient will practice using relaxation technique. Patient will identify a new coping skill.  Patient will follow multistep directions to reduce anxiety and stress.   Affect/Mood: N/A   Participation Level: Did not attend    Clinical Observations/Individualized Feedback: Franklin [name] did not attend group.   Plan: Continue to engage patient in RT group sessions 2-3x/week.   Rosina Lowenstein, LRT, CTRS 11/18/2022 11:24 AM

## 2022-11-18 NOTE — Group Note (Signed)
Date:  11/18/2022 Time:  10:00 AM  Group Topic/Focus:  Goals Group:   The focus of this group is to help patients establish daily goals to achieve during treatment and discuss how the patient can incorporate goal setting into their daily lives to aide in recovery.    Participation Level:  Did Not Attend   Lynelle Smoke [facility] 11/18/2022, 10:00 AM

## 2022-11-18 NOTE — [facility] Suicide Risk Assessment (Signed)
[facility] Admission Suicide Risk Assessment   Nursing information obtained from:  Patient Demographic factors:  Male, Low socioeconomic status, Unemployed Current Mental Status:  Suicidal ideation indicated by patient Loss Factors:  Financial problems / change in socioeconomic status Historical Factors:  Family history of mental illness or substance abuse Risk Reduction Factors:  Religious beliefs about death  Total Time spent with patient: 2.5 hours Principal Problem: MDD (major depressive disorder) Diagnosis:  Principal Problem:   MDD (major depressive disorder)  Subjective Data:  52 year old male admitted for active suicidal ideations. He reports that he was on train tracks last night with a plan to walk in front of an oncoming train to end his life. He attributes his suicidal ideation to thoughts about his son's death three years ago.The patient has a history of polysubstance use disorder and reports using crack cocaine and alcohol last night. He states that after his last hospitalization (discharged November 01, 2022), he only took his prescribed medications for one week before stopping them and relapsing into substance use. He expresses a desire to participate in substance abuse treatment and is willing to transition to inpatient psychiatry for further care.he patient denies homicidal ideations, auditory or visual hallucinations, paranoia, or delusional thinking. He reports feeling overwhelmed by his current situation but expresses hope for improvement.  Continued Clinical Symptoms:  Alcohol Use Disorder Identification Test Final Score (AUDIT): 14 The "Alcohol Use Disorders Identification Test", Guidelines for Use in Primary Care, Second Edition.  World Science writer [facility]). Score between 0-7:  no or low risk or alcohol related problems. Score between 8-15:  moderate risk of alcohol related problems. Score between 16-19:  high risk of alcohol related problems. Score 20 or above:  warrants  further diagnostic evaluation for alcohol dependence and treatment.   CLINICAL FACTORS:   Depression:   Comorbid alcohol abuse/dependence Hopelessness Impulsivity Alcohol/Substance Abuse/Dependencies Medical Diagnoses and Treatments/Surgeries   Musculoskeletal: Strength & Muscle Tone: within normal limits Gait & Station: broad based Patient leans: N/A  Psychiatric Specialty Exam:  Presentation  General Appearance:  Disheveled  Eye Contact: Minimal  Speech: Clear and Coherent  Speech Volume: Decreased  Handedness: Right   Mood and Affect  Mood: Dysphoric; Hopeless; Depressed; Worthless  Affect: Depressed   Thought Process  Thought Processes: Goal Directed  Descriptions of Associations:Intact  Orientation:Full (Time, Place and Person)  Thought Content:Illogical  History of Schizophrenia/Schizoaffective disorder:No  Duration of Psychotic Symptoms:Greater than six months  Hallucinations:Hallucinations: None  Ideas of Reference:None  Suicidal Thoughts:Suicidal Thoughts: Yes, Active SI Active Intent and/or Plan: With Intent; With Plan; With Access to Means  Homicidal Thoughts:Homicidal Thoughts: No   Sensorium  Memory: Immediate Good; Recent Good; Remote Good  Judgment: Impaired  Insight: Fair   Chartered certified accountant: Fair  Attention Span: Fair  Recall: Good  Fund of Knowledge: Good  Language: Good   Psychomotor Activity  Psychomotor Activity: Psychomotor Activity: Normal   Assets  Assets: Communication Skills; Desire for Improvement   Sleep  Sleep: Sleep: Poor    Physical Exam: Physical Exam Vitals and nursing note reviewed.  Constitutional:      Appearance: Normal appearance.  HENT:     Head: Normocephalic and atraumatic.     Nose: Nose normal.  Pulmonary:     Effort: Pulmonary effort is normal.  Musculoskeletal:        General: Normal range of motion.     Cervical back: Normal range of  motion.  Neurological:     General: No focal deficit  present.     Mental Status: He is alert and oriented to person, place, and time.     Motor: Tremor present.     Gait: Gait abnormal.     Comments: Head and hand tremors  Psychiatric:        Attention and Perception: Perception normal. He is inattentive.        Mood and Affect: Affect is blunt and flat.        Speech: Speech is slurred.        Behavior: Behavior is hyperactive. Behavior is cooperative.        Thought Content: Thought content includes suicidal ideation. Thought content includes suicidal plan.        Cognition and Memory: Cognition normal. He exhibits impaired remote memory.        Judgment: Judgment is impulsive.    ROS Blood pressure 131/83, pulse 69, temperature 97.8 F (36.6 C), temperature source Oral, resp. rate 18, height 5\' 10"  (1.778 m), weight 67.1 kg, SpO2 100%. Body mass index is 21.24 kg/m.   COGNITIVE FEATURES THAT CONTRIBUTE TO RISK:  None    SUICIDE RISK:   Mild:  Suicidal ideation of limited frequency, intensity, duration, and specificity.  There are no identifiable plans, no associated intent, mild dysphoria and related symptoms, good self-control (both objective and subjective assessment), few other risk factors, and identifiable protective factors, including available and accessible social support.  PLAN OF CARE:  Initiate Risperdal 0.5 mg nightly for mood stabilization. Start Cogentin 1 mg daily to address extrapyramidal symptoms or other medication side effects. Implement CIWA protocol to monitor and manage withdrawal symptoms. Administer Ativan as indicated for withdrawal-related agitation or anxiety. Start Coreg 3.25 mg daily for blood pressure management and cardiovascular stability. Monitor vital signs closely to assess for any complications related to withdrawal or underlying medical issues. Coordinate with LCSW referral to a dual-diagnosis treatment program addressing both mental health  and substance use.  I certify that inpatient services furnished can reasonably be expected to improve the patient's condition.   Myriam Forehand, NP 11/18/2022, 7:00 PM

## 2022-11-19 MED ORDER — MOMETASONE FURO-FORMOTEROL FUM 200-5 MCG/ACT IN AERO
2.0000 | INHALATION_SPRAY | Freq: Two times a day (BID) | RESPIRATORY_TRACT | Status: DC
  Administered 2022-11-19 – 2022-11-23 (×3): 2 via RESPIRATORY_TRACT
  Filled 2022-11-19: qty 8.8

## 2022-11-19 MED ORDER — RISPERIDONE 1 MG PO TBDP
1.0000 mg | ORAL_TABLET | Freq: Every day | ORAL | Status: DC
  Administered 2022-11-19 – 2022-11-23 (×5): 1 mg via ORAL
  Filled 2022-11-19 (×5): qty 1

## 2022-11-19 MED ORDER — ALBUTEROL SULFATE HFA 108 (90 BASE) MCG/ACT IN AERS: 2.0000 | INHALATION_SPRAY | RESPIRATORY_TRACT | Status: DC | PRN

## 2022-11-19 MED ORDER — FLUOXETINE HCL 20 MG PO CAPS
20.0000 mg | ORAL_CAPSULE | Freq: Every day | ORAL | Status: DC
  Administered 2022-11-19 – 2022-11-24 (×6): 20 mg via ORAL
  Filled 2022-11-19 (×7): qty 1

## 2022-11-19 NOTE — Group Note (Signed)
Date:  11/19/2022 Time:  10:04 AM  Group Topic/Focus:  Spirituality:   The focus of this group is to discuss how one's spirituality can aide in recovery.    Participation Level:  Did Not Attend   Franklin [name] 11/19/2022, 10:04 AM

## 2022-11-19 NOTE — Plan of Care (Signed)
  Problem: Education: Goal: Verbalization of understanding the information provided will improve Outcome: Progressing   Problem: Education: Goal: Mental status will improve Outcome: Progressing   Problem: Education: Goal: Emotional status will improve Outcome: Progressing

## 2022-11-19 NOTE — [facility] Counselor (Signed)
CSW has faxed referrals to [facility] and [facility].  Penni Homans, MSW, LCSW 11/19/2022 1:19 PM

## 2022-11-19 NOTE — Progress Notes (Signed)
[facility] MD Progress Note  11/19/2022 2:29 PM Franklin [name]  MRN:  [id number] Subjective:  52 year old African American male, reports, "Can I have double portions?" He continues to endorse suicidal ideation without a specific plan or intent. The patient denies delusions, hallucinations, or paranoia.he patient continues to present with suicidal ideation but no active plan or intent. Observations of mouth and hand movements, as well as rapid walking, may indicate medication side effects or underlying agitation requiring further evaluation. Nutritional requests ("double portions") suggest increased appetite or a focus on physical needs. Principal Problem:  Principal Problem: MDD (major depressive disorder) Diagnosis: Principal Problem:   MDD (major depressive disorder)  Total Time spent with patient: 1 hour  Past Psychiatric History: Polysubstance Abuse Depression  Past Medical History:  Past Medical History:  Diagnosis Date   Anxiety    Bipolar 1 disorder (HCC)    COPD (chronic obstructive pulmonary disease) (HCC)    Depression    Schizophrenia (HCC)    Substance abuse (HCC)     Past Surgical History:  Procedure Laterality Date   NO PAST SURGERIES     Family History:  Family History  Problem Relation Age of Onset   Hypertension Other    Diabetes Other    Family Psychiatric  History: none reported Social History:  Social History   Substance and Sexual Activity  Alcohol Use Yes   Alcohol/week: 12.0 standard drinks of alcohol   Types: 12 Cans of beer per week     Social History   Substance and Sexual Activity  Drug Use Yes   Types: "Crack" cocaine, Cocaine   Comment: one gram daily    Social History   Socioeconomic History   Marital status: Single    Spouse name: Not on file   Number of children: Not on file   Years of education: Not on file   Highest education level: Not on file  Occupational History   Occupation: [employer]  Tobacco Use   Smoking status: Some Days     Current packs/day: 0.50    Types: Cigarettes   Smokeless tobacco: Never  Vaping Use   Vaping status: Never Used  Substance and Sexual Activity   Alcohol use: Yes    Alcohol/week: 12.0 standard drinks of alcohol    Types: 12 Cans of beer per week   Drug use: Yes    Types: "Crack" cocaine, Cocaine    Comment: one gram daily   Sexual activity: Yes  Other Topics Concern   Not on file  Social History Narrative   The patient was born and raised in with that by both his biological parents. His father's past way but his mother still living. He denies any history of any physical or sexual abuse. He says he completed 2 years of college at [employer]. 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 He has never been married but has a 16 year old son who lives with his mother. He says he does get to see his son and has a relationship with him. He is not currently dating or in a relationship.      The patient does have a history of a DUI and had a court date last Friday for a DUI. He denies any other pending charges.            Social Determinants of Health   Financial Resource Strain: Not on file  Food Insecurity: Food Insecurity Present (11/17/2022)  Hunger Vital Sign    Worried About Running Out of Food in the Last Year: Sometimes true    Ran Out of Food in the Last Year: Sometimes true  Transportation Needs: Unmet Transportation Needs (11/17/2022)   PRAPARE - Administrator, Civil Service (Medical): Yes    Lack of Transportation (Non-Medical): Yes  Physical Activity: Not on file  Stress: Not on file  Social Connections: Unknown (10/10/2022)   Received from [employer]   Social Network    Social Network: Not on file   Additional Social History:                         Sleep: Good  Appetite:  Good  Current Medications: Current Facility-Administered Medications  Medication Dose Route  Frequency Provider Last Rate Last Admin   acetaminophen (TYLENOL) tablet 650 mg  650 mg Oral Q6H PRN Penn, Cranston Neighbor, NP       alum & mag hydroxide-simeth (MAALOX/MYLANTA) 200-200-20 MG/5ML suspension 30 mL  30 mL Oral Q4H PRN Penn, Cicely, NP       benztropine (COGENTIN) tablet 1 mg  1 mg Oral Daily Myriam Forehand, NP   1 mg at 11/19/22 1022   carvedilol (COREG) tablet 3.125 mg  3.125 mg Oral BID WC Myriam Forehand, NP   3.125 mg at 11/19/22 1022   diphenhydrAMINE (BENADRYL) capsule 50 mg  50 mg Oral TID PRN Mcneil Sober, NP       Or   diphenhydrAMINE (BENADRYL) injection 50 mg  50 mg Intramuscular TID PRN Penn, Cranston Neighbor, NP       feeding supplement (ENSURE ENLIVE / ENSURE PLUS) liquid 237 mL  1 Bottle Oral BID BM Myriam Forehand, NP   237 mL at 11/19/22 1439   folic acid (FOLVITE) tablet 1 mg  1 mg Oral Daily Myriam Forehand, NP   1 mg at 11/19/22 1022   haloperidol (HALDOL) tablet 5 mg  5 mg Oral TID PRN Mcneil Sober, NP       Or   haloperidol lactate (HALDOL) injection 5 mg  5 mg Intramuscular TID PRN Mcneil Sober, NP       LORazepam (ATIVAN) tablet 0-4 mg  0-4 mg Oral Q6H Myriam Forehand, NP   2 mg at 11/17/22 2122   Followed by   LORazepam (ATIVAN) tablet 0-4 mg  0-4 mg Oral Q12H Myriam Forehand, NP       LORazepam (ATIVAN) tablet 1-4 mg  1-4 mg Oral Q1H PRN Myriam Forehand, NP       Or   LORazepam (ATIVAN) tablet 1 mg  1 mg Oral Q1H PRN Myriam Forehand, NP   1 mg at 11/18/22 1610   magnesium hydroxide (MILK OF MAGNESIA) suspension 30 mL  30 mL Oral Daily PRN Penn, Cranston Neighbor, NP       multivitamin with minerals tablet 1 tablet  1 tablet Oral Daily Myriam Forehand, NP   1 tablet at 11/19/22 1022   nicotine polacrilex (NICORETTE) gum 2 mg  2 mg Oral PRN Myriam Forehand, NP       risperiDONE (RISPERDAL M-TABS) disintegrating tablet 0.5 mg  0.5 mg Oral QHS Myriam Forehand, NP   0.5 mg at 11/18/22 2122   thiamine (VITAMIN B1) tablet 100 mg  100 mg Oral Daily Myriam Forehand, NP   100 mg at 11/19/22 1022   Or   thiamine  (VITAMIN B1) injection 100  mg  100 mg Intravenous Daily Myriam Forehand, NP       topiramate (TOPAMAX) tablet 50 mg  50 mg Oral Daily Myriam Forehand, NP   50 mg at 11/19/22 1022   traZODone (DESYREL) tablet 100 mg  100 mg Oral QHS PRN Myriam Forehand, NP        Lab Results: No results found for this or any previous visit (from the past 48 hour(s)).  Blood Alcohol level:  Lab Results  Component Value Date   ETH <10 11/17/2022   ETH <10 11/13/2022    Metabolic Disorder Labs: Lab Results  Component Value Date   HGBA1C 5.9 (A) 07/12/2022   MPG 117 02/02/2009   No results found for: "PROLACTIN" Lab Results  Component Value Date   CHOL 203 (H) 10/31/2022   TRIG 196 (H) 10/31/2022   HDL 84 10/31/2022   CHOLHDL 2.4 10/31/2022   VLDL 39 10/31/2022   LDLCALC 80 10/31/2022   LDLCALC 63 10/08/2014      Musculoskeletal: Strength & Muscle Tone: within normal limits Gait & Station: broad based Patient leans: N/A  Psychiatric Specialty Exam:  Presentation  General Appearance:  Disheveled  Eye Contact: Poor  Speech: Garbled  Speech Volume: Decreased  Handedness: Right   Mood and Affect  Mood: Depressed; Hopeless  Affect: Flat   Thought Process  Thought Processes: Disorganized  Descriptions of Associations:Tangential  Orientation:Partial  Thought Content:WDL  History of Schizophrenia/Schizoaffective disorder:No  Duration of Psychotic Symptoms:Greater than six months  Hallucinations:Hallucinations: None  Ideas of Reference:None  Suicidal Thoughts:Suicidal Thoughts: Yes, Passive SI Active Intent and/or Plan: With Intent; With Plan; Without Means to Carry Out; Without Access to Means SI Passive Intent and/or Plan: With Intent; Without Access to Means  Homicidal Thoughts:Homicidal Thoughts: No   Sensorium  Memory: Immediate Poor; Recent Poor  Judgment: Poor  Insight: Poor   Executive Functions  Concentration: Poor  Attention  Span: Poor  Recall: Poor  Fund of Knowledge: Good  Language: Fair   Psychomotor Activity  Psychomotor Activity: Psychomotor Activity: Restlessness; Extrapyramidal Side Effects (EPS) Extrapyramidal Side Effects (EPS): Tardive Dyskinesia AIMS Completed?: No   Assets  Assets: Communication Skills   Sleep  Sleep: Sleep: Fair Number of Hours of Sleep: 6    Physical Exam: Physical Exam Vitals and nursing note reviewed.  Constitutional:      Appearance: Normal appearance.  HENT:     Head: Normocephalic and atraumatic.     Nose: Nose normal.  Pulmonary:     Effort: Pulmonary effort is normal.  Musculoskeletal:        General: Normal range of motion.  Neurological:     General: No focal deficit present.     Mental Status: He is alert and oriented to person, place, and time.  Psychiatric:        Attention and Perception: Attention and perception normal.        Mood and Affect: Mood is anxious. Affect is flat.        Speech: Speech is delayed.        Behavior: Behavior normal. Behavior is cooperative.        Thought Content: Thought content includes suicidal ideation.        Cognition and Memory: Cognition and memory normal.        Judgment: Judgment normal.    ROS Blood pressure (!) 116/56, pulse 78, temperature 98.2 F (36.8 C), resp. rate 19, height 5\' 10"  (1.778 m), weight 67.1 kg, SpO2 96%.  Body mass index is 21.24 kg/m.   Treatment Plan Summary: Daily contact with patient to assess and evaluate symptoms and progress in treatment and Medication management Risperdal 1 mg nightly for mood stabilization. Cogentin 1 mg daily to address extrapyramidal symptoms or other medication side effects. Implement CIWA protocol to monitor and manage withdrawal symptoms. Prozac 20 mg daily for depression Administer Ativan as indicated for withdrawal-related agitation or anxiety. Start Coreg 3.25 mg daily for blood pressure management and cardiovascular  stability. Monitor vital signs closely to assess for any complications related to withdrawal or underlying medical issues. Coordinate with LCSW referral to a dual-diagnosis treatment program addressing both mental health and substance use.   Myriam Forehand, NP 11/19/2022, 4:49 PM

## 2022-11-19 NOTE — Progress Notes (Signed)
   11/19/22 1256  Psych Admission Type (Psych Patients Only)  Admission Status Voluntary  Psychosocial Assessment  Patient Complaints None  Eye Contact Brief  Facial Expression Flat  Affect Flat  Speech Logical/coherent  Interaction Assertive  Motor Activity Slow  Appearance/Hygiene In scrubs  Behavior Characteristics Cooperative  Mood Depressed  Thought Process  Coherency WDL  Content WDL  Delusions None reported or observed  Perception WDL  Hallucination None reported or observed  Judgment WDL  Confusion WDL  Danger to Self  Current suicidal ideation? Denies  Agreement Not to Harm Self Yes  Danger to Others  Danger to Others None reported or observed

## 2022-11-19 NOTE — Group Note (Signed)
Date:  11/19/2022 Time:  4:46 PM  Group Topic/Focus:  Activity Group:  The focus of the group is to encourage patients to go outside to the courtyard to get some fresh air and exercise.    Participation Level:  Active  Participation Quality:  Appropriate  Affect:  Appropriate  Cognitive:  Appropriate  Insight: Appropriate  Engagement in Group:  Engaged  Modes of Intervention:  Activity  Additional Comments:    Franklin [name] Franklin [name] 11/19/2022, 4:46 PM

## 2022-11-19 NOTE — Plan of Care (Signed)
  D- Patient alert and oriented. Patient presented in a pleasant mood . Denies SI, HI, AVH, and pain. Ciwa still being performed per md order. A- Scheduled medications administered to patient, per MD orders. Support and encouragement provided.  Routine safety checks conducted every 15 minutes.  Patient informed to notify staff with problems or concerns. R- No adverse drug reactions noted. Patient contracts for safety at this time. Patient compliant with medications and treatment plan. Patient receptive, calm, and cooperative. Patient interacts well with others on the unit.  Patient remains safe at this time.      Problem: Education: Goal: Knowledge of [place] [facility] Education information/materials will improve Outcome: Not Progressing Goal: Emotional status will improve Outcome: Not Progressing Goal: Mental status will improve Outcome: Not Progressing Goal: Verbalization of understanding the information provided will improve Outcome: Not Progressing

## 2022-11-19 NOTE — Group Note (Signed)
Date:  11/19/2022 Time:  11:34 PM  Group Topic/Focus:  Wrap-Up Group:   The focus of this group is to help patients review their daily goal of treatment and discuss progress on daily workbooks.    Participation Level:  Did Not Attend  11/19/2022, 11:34 PM

## 2022-11-19 NOTE — BH IP Treatment Plan (Signed)
Interdisciplinary Treatment and Diagnostic Plan Update  11/19/2022 Time of Session: 9:47AM Franklin [name] MRN: [id number]  Principal Diagnosis: MDD (major depressive disorder)  Secondary Diagnoses: Principal Problem:   MDD (major depressive disorder)   Current Medications:  Current Facility-Administered Medications  Medication Dose Route Frequency Provider Last Rate Last Admin   acetaminophen (TYLENOL) tablet 650 mg  650 mg Oral Q6H PRN Penn, Cranston Neighbor, NP       alum & mag hydroxide-simeth (MAALOX/MYLANTA) 200-200-20 MG/5ML suspension 30 mL  30 mL Oral Q4H PRN Penn, Cicely, NP       benztropine (COGENTIN) tablet 1 mg  1 mg Oral Daily Myriam Forehand, NP   1 mg at 11/19/22 1022   carvedilol (COREG) tablet 3.125 mg  3.125 mg Oral BID WC Myriam Forehand, NP   3.125 mg at 11/19/22 1022   diphenhydrAMINE (BENADRYL) capsule 50 mg  50 mg Oral TID PRN Mcneil Sober, NP       Or   diphenhydrAMINE (BENADRYL) injection 50 mg  50 mg Intramuscular TID PRN Penn, Cranston Neighbor, NP       feeding supplement (ENSURE ENLIVE / ENSURE PLUS) liquid 237 mL  1 Bottle Oral BID BM Myriam Forehand, NP       folic acid (FOLVITE) tablet 1 mg  1 mg Oral Daily Myriam Forehand, NP   1 mg at 11/19/22 1022   haloperidol (HALDOL) tablet 5 mg  5 mg Oral TID PRN Mcneil Sober, NP       Or   haloperidol lactate (HALDOL) injection 5 mg  5 mg Intramuscular TID PRN Mcneil Sober, NP       LORazepam (ATIVAN) tablet 0-4 mg  0-4 mg Oral Q6H Myriam Forehand, NP   2 mg at 11/17/22 2122   Followed by   LORazepam (ATIVAN) tablet 0-4 mg  0-4 mg Oral Q12H Myriam Forehand, NP       LORazepam (ATIVAN) tablet 1-4 mg  1-4 mg Oral Q1H PRN Myriam Forehand, NP       Or   LORazepam (ATIVAN) tablet 1 mg  1 mg Oral Q1H PRN Myriam Forehand, NP   1 mg at 11/18/22 7829   magnesium hydroxide (MILK OF MAGNESIA) suspension 30 mL  30 mL Oral Daily PRN Penn, Cranston Neighbor, NP       multivitamin with minerals tablet 1 tablet  1 tablet Oral Daily Myriam Forehand, NP   1 tablet at 11/19/22  1022   nicotine polacrilex (NICORETTE) gum 2 mg  2 mg Oral PRN Myriam Forehand, NP       risperiDONE (RISPERDAL M-TABS) disintegrating tablet 0.5 mg  0.5 mg Oral QHS Myriam Forehand, NP   0.5 mg at 11/18/22 2122   thiamine (VITAMIN B1) tablet 100 mg  100 mg Oral Daily Myriam Forehand, NP   100 mg at 11/19/22 1022   Or   thiamine (VITAMIN B1) injection 100 mg  100 mg Intravenous Daily Myriam Forehand, NP       topiramate (TOPAMAX) tablet 50 mg  50 mg Oral Daily Myriam Forehand, NP   50 mg at 11/19/22 1022   traZODone (DESYREL) tablet 100 mg  100 mg Oral QHS PRN Myriam Forehand, NP       PTA Medications: Medications Prior to Admission  Medication Sig Dispense Refill Last Dose   albuterol (VENTOLIN HFA) 108 (90 Base) MCG/ACT inhaler Inhale 2 puffs into the lungs every 6 (six) hours as needed for  wheezing or shortness of breath. 6.7 g 2    carvedilol (COREG) 3.125 MG tablet Take 1 tablet (3.125 mg total) by mouth 2 (two) times daily with a meal. 60 tablet 1    chlordiazePOXIDE (LIBRIUM) 25 MG capsule Take 25 mg by mouth 3 (three) times daily. (Patient not taking: Reported on 11/17/2022)      FLUoxetine (PROZAC) 20 MG capsule Take 1 capsule (20 mg total) by mouth daily. 30 capsule 3    fluticasone-salmeterol (ADVAIR DISKUS) 250-50 MCG/ACT AEPB Inhale 1 puff into the lungs in the morning and at bedtime. 60 each 1    risperiDONE (RISPERDAL) 0.5 MG tablet Take 1 tablet (0.5 mg total) by mouth 2 (two) times daily at 8 am and 4 pm. 60 tablet 3    traZODone (DESYREL) 100 MG tablet Take 1 tablet (100 mg total) by mouth at bedtime. 30 tablet 3     Patient Stressors: Financial difficulties   Medication change or noncompliance   Substance abuse    Patient Strengths: Capable of independent living  Work skills   Treatment Modalities: Medication Management, Group therapy, Case management,  1 to 1 session with clinician, Psychoeducation, Recreational therapy.   Physician Treatment Plan for Primary Diagnosis: MDD  (major depressive disorder) Long Term Goal(s): Improvement in symptoms so as ready for discharge   Short Term Goals: Ability to identify changes in lifestyle to reduce recurrence of condition will improve Ability to verbalize feelings will improve Ability to disclose and discuss suicidal ideas Ability to demonstrate self-control will improve Ability to identify and develop effective coping behaviors will improve Ability to maintain clinical measurements within normal limits will improve Compliance with prescribed medications will improve Ability to identify triggers associated with substance abuse/mental health issues will improve  Medication Management: Evaluate patient's response, side effects, and tolerance of medication regimen.  Therapeutic Interventions: 1 to 1 sessions, Unit Group sessions and Medication administration.  Evaluation of Outcomes: Not Met  Physician Treatment Plan for Secondary Diagnosis: Principal Problem:   MDD (major depressive disorder)  Long Term Goal(s): Improvement in symptoms so as ready for discharge   Short Term Goals: Ability to identify changes in lifestyle to reduce recurrence of condition will improve Ability to verbalize feelings will improve Ability to disclose and discuss suicidal ideas Ability to demonstrate self-control will improve Ability to identify and develop effective coping behaviors will improve Ability to maintain clinical measurements within normal limits will improve Compliance with prescribed medications will improve Ability to identify triggers associated with substance abuse/mental health issues will improve     Medication Management: Evaluate patient's response, side effects, and tolerance of medication regimen.  Therapeutic Interventions: 1 to 1 sessions, Unit Group sessions and Medication administration.  Evaluation of Outcomes: Not Met   RN Treatment Plan for Primary Diagnosis: MDD (major depressive disorder) Long Term  Goal(s): Knowledge of disease and therapeutic regimen to maintain health will improve  Short Term Goals: Ability to demonstrate self-control, Ability to participate in decision making will improve, Ability to verbalize feelings will improve, Ability to disclose and discuss suicidal ideas, Ability to identify and develop effective coping behaviors will improve, and Compliance with prescribed medications will improve  Medication Management: RN will administer medications as ordered by provider, will assess and evaluate patient's response and provide education to patient for prescribed medication. RN will report any adverse and/or side effects to prescribing provider.  Therapeutic Interventions: 1 on 1 counseling sessions, Psychoeducation, Medication administration, Evaluate responses to treatment, Monitor vital signs and CBGs  as ordered, Perform/monitor CIWA, COWS, AIMS and Fall Risk screenings as ordered, Perform wound care treatments as ordered.  Evaluation of Outcomes: Not Met   LCSW Treatment Plan for Primary Diagnosis: MDD (major depressive disorder) Long Term Goal(s): Safe transition to appropriate next level of care at discharge, Engage patient in therapeutic group addressing interpersonal concerns.  Short Term Goals: Engage patient in aftercare planning with referrals and resources, Increase social support, Increase ability to appropriately verbalize feelings, Increase emotional regulation, Facilitate acceptance of mental health diagnosis and concerns, Facilitate patient progression through stages of change regarding substance use diagnoses and concerns, Identify triggers associated with mental health/substance abuse issues, and Increase skills for wellness and recovery  Therapeutic Interventions: Assess for all discharge needs, 1 to 1 time with Social worker, Explore available resources and support systems, Assess for adequacy in community support network, Educate family and significant  other(s) on suicide prevention, Complete Psychosocial Assessment, Interpersonal group therapy.  Evaluation of Outcomes: Not Met   Progress in Treatment: Attending groups: No. Participating in groups: No. Taking medication as prescribed: Yes. Toleration medication: Yes. Family/Significant other contact made: No, will contact:  once permission has been granted Patient understands diagnosis: Yes. Discussing patient identified problems/goals with staff: Yes. Medical problems stabilized or resolved: Yes. Denies suicidal/homicidal ideation: Yes. Issues/concerns per patient self-inventory: No. Other: none   New problem(s) identified: No, Describe:  none  New Short Term/Long Term Goal(s): detox, elimination of symptoms of psychosis, medication management for mood stabilization; elimination of SI thoughts; development of comprehensive mental wellness/sobriety plan.   Patient Goals:  "get back on my meds"  Discharge Plan or Barriers: Patient reports that he is currently homeless.  H e is seeking a referral for a treatment facility to address his substance use.    Reason for Continuation of Hospitalization: Anxiety Depression Medication stabilization Suicidal ideation Withdrawal symptoms  Estimated Length of Stay:  1-7 days  Last 3 [place] Suicide Severity Risk Score: Flowsheet Row Admission (Current) from [facility]17[facility]INE Most recent reading at 11/17/2022  4:49 PM ED from 11/17/2022 in [facility] Emergency Department at [facility] Most recent reading at 11/17/2022 11:46 AM [facility] Most recent reading at 11/13/2022 11:09 AM  C-SSRS RISK CATEGORY Moderate Risk High Risk High Risk       Last PHQ 2/9 Scores:     No data to display          Scribe for Treatment Team: Harden Mo, LCSW 11/19/2022 1:14 PM

## 2022-11-19 NOTE — Group Note (Signed)
Recreation Therapy Group Note   Group Topic:Leisure Education  Group Date: 11/19/2022 Start Time: 1015 End Time: 1115 Facilitators: Rosina Lowenstein, LRT, CTRS Location:  Craft Room  Group Description: Leisure. Patients were given the option to choose from singing karaoke, coloring mandalas, using oil pastels, journaling, or playing with play-doh. LRT and pts discussed the meaning of leisure, the importance of participating in leisure during their free time/when they're outside of the hospital, as well as how our leisure interests can also serve as coping skills.   Goal Area(s) Addressed:  Patient will identify a current leisure interest.  Patient will learn the definition of "leisure". Patient will practice making a positive decision. Patient will have the opportunity to try a new leisure activity. Patient will communicate with peers and LRT.    Affect/Mood: N/A   Participation Level: Did not attend    Clinical Observations/Individualized Feedback: Say did not attend group.   Plan: Continue to engage patient in RT group sessions 2-3x/week.   Rosina Lowenstein, LRT, CTRS 11/19/2022 11:58 AM

## 2022-11-19 NOTE — Group Note (Unsigned)
Date:  11/19/2022 Time:  11:19 PM  Group Topic/Focus:  Watch Movie     Participation Level:  {BHH PARTICIPATION HQION:62952}  Participation Quality:  {BHH PARTICIPATION QUALITY:22265}  Affect:  {BHH AFFECT:22266}  Cognitive:  {BHH COGNITIVE:22267}  Insight: {BHH Insight2:20797}  Engagement in Group:  {BHH ENGAGEMENT IN GROUP:22268}  Modes of Intervention:  {BHH MODES OF INTERVENTION:22269}  Additional Comments:  ***  [name] 11/19/2022, 11:19 PM

## 2022-11-20 NOTE — Group Note (Signed)
Date:  11/20/2022 Time:  8:51 PM  Group Topic/Focus:  Wrap-Up Group:   The focus of this group is to help patients review their daily goal of treatment and discuss progress on daily workbooks.    Participation Level:  Active  Participation Quality:  Appropriate  Affect:  Appropriate  Cognitive:  Appropriate  Insight: Appropriate  Engagement in Group:  Engaged  Modes of Intervention:  Discussion    [name] 11/20/2022, 8:51 PM

## 2022-11-20 NOTE — Progress Notes (Signed)
[facility] MD Progress Note  11/20/2022  7:09 PM Franklin [name]  MRN:  [id number] Subjective:  52 year old African American male "Am I going home soon?"Denies suicidal ideation (SI), homicidal ideation (HI), self-injurious behavior (SIB), and delusions or hallucinations.Denies suicidal ideation (SI), homicidal ideation (HI), self-injurious behavior (SIB), and delusions or hallucinations.  Principal Problem: Major depressive disorder, recurrent severe without psychotic features (HCC) Diagnosis: Principal Problem:   Major depressive disorder, recurrent severe without psychotic features (HCC)  Total Time spent with patient: 45 minutes  Past Psychiatric History: Depression Polysubstance  Past Medical History:  Past Medical History:  Diagnosis Date   Anxiety    COPD (chronic obstructive pulmonary disease) (HCC)    Depression    Substance abuse (HCC)     Past Surgical History:  Procedure Laterality Date   NO PAST SURGERIES     Family History:  Family History  Problem Relation Age of Onset   Hypertension Other    Diabetes Other    Family Psychiatric  History: none reported Social History:  Social History   Substance and Sexual Activity  Alcohol Use Yes   Alcohol/week: 12.0 standard drinks of alcohol   Types: 12 Cans of beer per week     Social History   Substance and Sexual Activity  Drug Use Yes   Types: "Crack" cocaine, Cocaine   Comment: one gram daily    Social History   Socioeconomic History   Marital status: Single    Spouse name: Not on file   Number of children: Not on file   Years of education: Not on file   Highest education level: Not on file  Occupational History   Occupation: [employer]  Tobacco Use   Smoking status: Some Days    Current packs/day: 0.50    Types: Cigarettes   Smokeless tobacco: Never  Vaping Use   Vaping status: Never Used  Substance and Sexual Activity   Alcohol use: Yes    Alcohol/week: 12.0 standard drinks of alcohol    Types: 12 Cans of  beer per week   Drug use: Yes    Types: "Crack" cocaine, Cocaine    Comment: one gram daily   Sexual activity: Yes  Other Topics Concern   Not on file  Social History Narrative   The patient was born and raised in with that by both his biological parents. His father's past way but his mother still living. He denies any history of any physical or sexual abuse. He says he completed 2 years of college at [employer]. 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 He has never been married but has a 74 year old son who lives with his mother. He says he does get to see his son and has a relationship with him. He is not currently dating or in a relationship.      The patient does have a history of a DUI and had a court date last Friday for a DUI. He denies any other pending charges.            Social Determinants of Health   Financial Resource Strain: Not on file  Food Insecurity: Food Insecurity Present (11/17/2022)   Hunger Vital Sign    Worried About Running Out of Food in the Last Year: Sometimes true    Ran Out of Food in the Last Year: Sometimes true  Transportation Needs: Unmet Transportation Needs (11/17/2022)   PRAPARE - Transportation  Lack of Transportation (Medical): Yes    Lack of Transportation (Non-Medical): Yes  Physical Activity: Not on file  Stress: Not on file  Social Connections: Unknown (10/10/2022)   Received from [facility]   Social Network    Social Network: Not on file   Additional Social History:  Specify valuables returned: Pls see belonging sheet                      Sleep: Good  Appetite:  Good  Current Medications: No current facility-administered medications for this encounter.   Current Outpatient Medications  Medication Sig Dispense Refill   benztropine (COGENTIN) 1 MG tablet Take 1 tablet (1 mg total) by mouth daily for 10 days. 10 tablet 0   carvedilol  (COREG) 3.125 MG tablet Take 1 tablet (3.125 mg total) by mouth 2 (two) times daily with a meal. 10 tablet 0   FLUoxetine (PROZAC) 20 MG capsule Take 1 capsule (20 mg total) by mouth daily for 10 days. 10 capsule 0   mometasone-formoterol (DULERA) 200-5 MCG/ACT AERO Inhale 2 puffs into the lungs 2 (two) times daily for 10 days. 1 each 0   risperiDONE (RISPERDAL) 1 MG tablet Take 1 tablet (1 mg total) by mouth at bedtime for 10 days. 10 tablet 0   topiramate (TOPAMAX) 50 MG tablet Take 1 tablet (50 mg total) by mouth daily for 10 days. 10 tablet 0   traZODone (DESYREL) 100 MG tablet Take 1 tablet (100 mg total) by mouth at bedtime as needed for up to 10 days for sleep. 10 tablet 0    Lab Results: No results found for this or any previous visit (from the past 48 hour(s)).  Blood Alcohol level:  Lab Results  Component Value Date   ETH <10 11/17/2022   ETH <10 11/13/2022    Metabolic Disorder Labs: Lab Results  Component Value Date   HGBA1C 5.9 (A) 07/12/2022   MPG 117 02/02/2009   No results found for: "PROLACTIN" Lab Results  Component Value Date   CHOL 203 (H) 10/31/2022   TRIG 196 (H) 10/31/2022   HDL 84 10/31/2022   CHOLHDL 2.4 10/31/2022   VLDL 39 10/31/2022   LDLCALC 80 10/31/2022   LDLCALC 63 10/08/2014    Physical Findings: AIMS:  , ,  ,  ,    CIWA:  CIWA-Ar Total: 5 COWS:  COWS Total Score: 0  Musculoskeletal: Strength & Muscle Tone: within normal limits Gait & Station: normal Patient leans: N/A  Psychiatric Specialty Exam:  Presentation  General Appearance:  Appropriate for Environment; Fairly Groomed  Eye Contact: Good  Speech: Clear and Coherent; Normal Rate  Speech Volume: Normal  Handedness: Right   Mood and Affect  Mood: -- ("frustrated")  Affect: Blunt   Thought Process  Thought Processes: Coherent; Goal Directed; Linear  Descriptions of Associations:Intact  Orientation:Full (Time, Place and Person)  Thought  Content:Logical  History of Schizophrenia/Schizoaffective disorder:No  Duration of Psychotic Symptoms:Greater than six months  Hallucinations:No data recorded  Ideas of Reference:None  Suicidal Thoughts:none   Homicidal Thoughts: none   Sensorium  Memory: Immediate Good; Recent Good  Judgment: Fair  Insight: Fair   Art therapist  Concentration: Good  Attention Span: Good  Recall: Good  Fund of Knowledge: Good  Language: Good   Psychomotor Activity  Psychomotor Activity: No data recorded   Assets  Assets: Communication Skills; Desire for Improvement; Financial Resources/Insurance; Resilience   Sleep  Sleep: No data recorded  Physical Exam: Physical Exam Vitals and nursing note reviewed.  HENT:     Head: Normocephalic and atraumatic.     Nose: Nose normal.  Pulmonary:     Effort: Pulmonary effort is normal.  Musculoskeletal:        General: Normal range of motion.     Cervical back: Normal range of motion.  Neurological:     General: No focal deficit present.     Mental Status: He is alert and oriented to person, place, and time. Mental status is at baseline.  Psychiatric:        Attention and Perception: Attention and perception normal.        Mood and Affect: Mood is anxious. Affect is flat.        Speech: Speech normal.        Behavior: Behavior normal. Behavior is cooperative.        Thought Content: Thought content normal.        Cognition and Memory: Cognition and memory normal.        Judgment: Judgment normal.    Review of Systems  All other systems reviewed and are negative.  Blood pressure 118/73, pulse 65, temperature 98.5 F (36.9 C), resp. rate 19, height 5\' 10"  (1.778 m), weight 67.1 kg, SpO2 95%. Body mass index is 21.24 kg/m.   Treatment Plan Summary: Daily contact with patient to assess and evaluate symptoms and progress in treatment and Medication management Risperdal 1 mg nightly for mood  stabilization. Cogentin 1 mg daily to address extrapyramidal symptoms or other medication side effects. Implement CIWA protocol to monitor and manage withdrawal symptoms. Prozac 20 mg daily for depression Administer Ativan as indicated for withdrawal-related agitation or anxiety. Coreg 3.25 mg daily for blood pressure management and cardiovascular stability. Monitor vital signs closely to assess for any complications related to withdrawal or underlying medical issues. Coordinate with LCSW referral to a dual-diagnosis treatment program addressing both mental health and substance use.  Review the patient's dietary needs and consult with dietary staff to address the request for additional food portions. Ensure dietary intake aligns with health and treatment goals. Myriam Forehand, NP 12/01/2022, 2:35 PM

## 2022-11-20 NOTE — Progress Notes (Signed)
   11/20/22 0900  Psych Admission Type (Psych Patients Only)  Admission Status Voluntary  Psychosocial Assessment  Patient Complaints None  Eye Contact Fair  Facial Expression Flat  Affect Flat  Speech Logical/coherent  Interaction Assertive  Motor Activity Slow  Appearance/Hygiene In scrubs  Behavior Characteristics Cooperative  Mood Depressed  Thought Process  Coherency WDL  Content WDL  Delusions None reported or observed  Perception WDL  Hallucination None reported or observed  Judgment WDL  Confusion None  Danger to Self  Current suicidal ideation? Passive  Agreement Not to Harm Self Yes  Description of Agreement verbal

## 2022-11-20 NOTE — Progress Notes (Signed)
Nursing Shift Note:  1900-0700  Attended Evening Group: No Medication Compliant:  Yes Behavior: cooperative; depressed Sleep Quality: good Significant Changes: none   11/20/22 0600  Psych Admission Type (Psych Patients Only)  Admission Status Voluntary  Psychosocial Assessment  Patient Complaints None  Eye Contact Fair  Facial Expression Flat  Affect Flat  Speech Logical/coherent  Interaction Assertive  Motor Activity Slow  Appearance/Hygiene In scrubs  Behavior Characteristics Cooperative  Mood Depressed  Thought Process  Coherency WDL  Content WDL  Delusions None reported or observed  Perception WDL  Hallucination None reported or observed  Judgment WDL  Confusion WDL  Danger to Self  Current suicidal ideation? Passive  Agreement Not to Harm Self Yes  Danger to Others  Danger to Others None reported or observed

## 2022-11-21 NOTE — Plan of Care (Signed)
  Problem: Education: Goal: Knowledge of [facility] Education information/materials will improve Outcome: Progressing Goal: Emotional status will improve Outcome: Progressing Goal: Mental status will improve Outcome: Progressing Goal: Verbalization of understanding the information provided will improve Outcome: Progressing   Problem: Activity: Goal: Interest or engagement in activities will improve Outcome: Progressing Goal: Sleeping patterns will improve Outcome: Progressing   Problem: Coping: Goal: Ability to verbalize frustrations and anger appropriately will improve Outcome: Progressing Goal: Ability to demonstrate self-control will improve Outcome: Progressing   Problem: Health Behavior/Discharge Planning: Goal: Identification of resources available to assist in meeting health care needs will improve Outcome: Progressing Goal: Compliance with treatment plan for underlying cause of condition will improve Outcome: Progressing

## 2022-11-21 NOTE — Plan of Care (Signed)

## 2022-11-21 NOTE — Plan of Care (Signed)
  Problem: Activity: Goal: Interest or engagement in activities will improve Outcome: Progressing Goal: Sleeping patterns will improve Outcome: Progressing   Problem: Coping: Goal: Ability to verbalize frustrations and anger appropriately will improve Outcome: Progressing Goal: Ability to demonstrate self-control will improve Outcome: Progressing   Problem: Education: Goal: Knowledge of [facility] Education information/materials will improve Outcome: Progressing Goal: Emotional status will improve Outcome: Progressing

## 2022-11-21 NOTE — Group Note (Signed)
Date:  11/21/2022 Time:  4:19 PM  Group Topic/Focus:  OUTDOOR RECREATION STRUCTURED ACTIVITY    Participation Level:  Active  Participation Quality:  Appropriate  Affect:  Appropriate  Cognitive:  Appropriate  Insight: Appropriate  Engagement in Group:  Engaged  Modes of Intervention:  Activity  Additional Comments:    Franklin [name] 11/21/2022, 4:19 PM

## 2022-11-21 NOTE — Progress Notes (Signed)
Patient alert and oriented x4,  affect & mood anxious at times, he denied SI, HI, plan or intent and he also denied AVH, and pain. Pt c/o having anxiety and generalized body aches and requested Ativan but he did not meet criteria for receiving the medication r/t withdrawal sx. Pt pleasant, cooperative and retired to bed early.  A- Scheduled medications administered to patient, per MD orders. Support and encouragement provided.  Routine safety checks conducted every 15 minutes.  Patient informed to notify staff with problems or concerns. R- No adverse drug reactions noted. Patient contracts for safety at this time. Patient compliant with medications and treatment plan. Patient receptive, calm, and .patient interacts well with others on the unit.  Patient remains safe at this time.

## 2022-11-21 NOTE — Group Note (Signed)
LCSW Group Therapy Note   Group Date: 11/21/2022 Start Time: 1310 End Time: 1350   Type of Therapy and Topic:  Group Therapy: Feelings Awareness  Participation Level:  Active    Summary of Patient Progress:  The patient attended group. The patient demonstrated an understanding of the difference between a feeling and a behavior. The patient participated during both the discussion and game of Feelings Charades.     Therapeutic Modalities:   Cognitive behavioral therapy (CBT)  Marshell Levan, LCSWA 11/21/2022  2:07 PM

## 2022-11-21 NOTE — Group Note (Signed)
Date:  11/21/2022 Time:  12:48 PM  Group Topic/Focus:  Goals Group:   The focus of this group is to help patients establish daily goals to achieve during treatment and discuss how the patient can incorporate goal setting into their daily lives to aide in recovery. Self Care:   The focus of this group is to help patients understand the importance of self-care in order to improve or restore emotional, physical, spiritual, interpersonal, and financial health.    Participation Level:  Did Not Attend  Franklin [name] 11/21/2022, 12:48 PM

## 2022-11-21 NOTE — Group Note (Signed)
Date:  11/21/2022 Time:  8:43 PM  Group Topic/Focus:  Wrap-Up Group:   The focus of this group is to help patients review their daily goal of treatment and discuss progress on daily workbooks.    Participation Level:  Active  Participation Quality:  Appropriate  Affect:  Appropriate  Cognitive:  Appropriate  Insight: Appropriate  Engagement in Group:  Engaged  Modes of Intervention:  Discussion, Education, and Support  Additional Comments:    Wilford Corner 11/21/2022, 8:43 PM

## 2022-11-21 NOTE — Progress Notes (Signed)
[facility] MD Progress Note  11/21/2022 11:23 AM Franklin [name]  MRN:  [id number] Subjective:   52 year old African American male "Can I have double portions?" requesting additional food portions.Denies suicidal ideation (SI), homicidal ideation (HI), self-injurious behavior (SIB), and delusions or hallucinations.Denies suicidal ideation (SI), homicidal ideation (HI), self-injurious behavior (SIB), and delusions or hallucinations. Principal Problem: MDD (major depressive disorder) Diagnosis: Principal Problem:   MDD (major depressive disorder)  Total Time spent with patient: 45 minutes  Past Psychiatric History: Anxiety Bipolar  Past Medical History:  Past Medical History:  Diagnosis Date   Anxiety    Bipolar 1 disorder (HCC)    COPD (chronic obstructive pulmonary disease) (HCC)    Depression    Schizophrenia (HCC)    Substance abuse (HCC)     Past Surgical History:  Procedure Laterality Date   NO PAST SURGERIES     Family History:  Family History  Problem Relation Age of Onset   Hypertension Other    Diabetes Other    Family Psychiatric  History: none reported Social History:  Social History   Substance and Sexual Activity  Alcohol Use Yes   Alcohol/week: 12.0 standard drinks of alcohol   Types: 12 Cans of beer per week     Social History   Substance and Sexual Activity  Drug Use Yes   Types: "Crack" cocaine, Cocaine   Comment: one gram daily    Social History   Socioeconomic History   Marital status: Single    Spouse name: Not on file   Number of children: Not on file   Years of education: Not on file   Highest education level: Not on file  Occupational History   Occupation: [employer]  Tobacco Use   Smoking status: Some Days    Current packs/day: 0.50    Types: Cigarettes   Smokeless tobacco: Never  Vaping Use   Vaping status: Never Used  Substance and Sexual Activity   Alcohol use: Yes    Alcohol/week: 12.0 standard drinks of alcohol    Types: 12 Cans of beer  per week   Drug use: Yes    Types: "Crack" cocaine, Cocaine    Comment: one gram daily   Sexual activity: Yes  Other Topics Concern   Not on file  Social History Narrative   The patient was born and raised in with that by both his biological parents. His father's past way but his mother still living. He denies any history of any physical or sexual abuse. He says he completed 2 years of college at [employer]. 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 He has never been married but has a 25 year old son who lives with his mother. He says he does get to see his son and has a relationship with him. He is not currently dating or in a relationship.      The patient does have a history of a DUI and had a court date last Friday for a DUI. He denies any other pending charges.            Social Determinants of Health   Financial Resource Strain: Not on file  Food Insecurity: Food Insecurity Present (11/17/2022)   Hunger Vital Sign    Worried About Running Out of Food in the Last Year: Sometimes true    Ran Out of Food in the Last Year: Sometimes true  Transportation Needs: Unmet Transportation Needs (11/17/2022)  PRAPARE - Administrator, Civil Service (Medical): Yes    Lack of Transportation (Non-Medical): Yes  Physical Activity: Not on file  Stress: Not on file  Social Connections: Unknown (10/10/2022)   Received from [employer]   Social Network    Social Network: Not on file   Additional Social History:                         Sleep: Good  Appetite:  Good  Current Medications: Current Facility-Administered Medications  Medication Dose Route Frequency Provider Last Rate Last Admin   acetaminophen (TYLENOL) tablet 650 mg  650 mg Oral Q6H PRN Penn, Cicely, NP       albuterol (VENTOLIN HFA) 108 (90 Base) MCG/ACT inhaler 2 puff  2 puff Inhalation Q4H PRN Myriam Forehand, NP       alum  & mag hydroxide-simeth (MAALOX/MYLANTA) 200-200-20 MG/5ML suspension 30 mL  30 mL Oral Q4H PRN Penn, Cranston Neighbor, NP   30 mL at 11/20/22 1848   benztropine (COGENTIN) tablet 1 mg  1 mg Oral Daily Myriam Forehand, NP   1 mg at 11/21/22 0912   carvedilol (COREG) tablet 3.125 mg  3.125 mg Oral BID WC Myriam Forehand, NP   3.125 mg at 11/21/22 1713   diphenhydrAMINE (BENADRYL) capsule 50 mg  50 mg Oral TID PRN Mcneil Sober, NP       Or   diphenhydrAMINE (BENADRYL) injection 50 mg  50 mg Intramuscular TID PRN Penn, Cranston Neighbor, NP       feeding supplement (ENSURE ENLIVE / ENSURE PLUS) liquid 237 mL  1 Bottle Oral BID BM Myriam Forehand, NP   237 mL at 11/21/22 1744   FLUoxetine (PROZAC) capsule 20 mg  20 mg Oral Daily Myriam Forehand, NP   20 mg at 11/21/22 4010   folic acid (FOLVITE) tablet 1 mg  1 mg Oral Daily Myriam Forehand, NP   1 mg at 11/21/22 2725   haloperidol (HALDOL) tablet 5 mg  5 mg Oral TID PRN Mcneil Sober, NP       Or   haloperidol lactate (HALDOL) injection 5 mg  5 mg Intramuscular TID PRN Mcneil Sober, NP       LORazepam (ATIVAN) tablet 0-4 mg  0-4 mg Oral Q12H Myriam Forehand, NP       magnesium hydroxide (MILK OF MAGNESIA) suspension 30 mL  30 mL Oral Daily PRN Penn, Cranston Neighbor, NP       mometasone-formoterol (DULERA) 200-5 MCG/ACT inhaler 2 puff  2 puff Inhalation BID Myriam Forehand, NP   2 puff at 11/19/22 2100   multivitamin with minerals tablet 1 tablet  1 tablet Oral Daily Myriam Forehand, NP   1 tablet at 11/21/22 3664   nicotine polacrilex (NICORETTE) gum 2 mg  2 mg Oral PRN Myriam Forehand, NP       risperiDONE (RISPERDAL M-TABS) disintegrating tablet 1 mg  1 mg Oral QHS Myriam Forehand, NP   1 mg at 11/20/22 2054   thiamine (VITAMIN B1) tablet 100 mg  100 mg Oral Daily Myriam Forehand, NP   100 mg at 11/20/22 4034   Or   thiamine (VITAMIN B1) injection 100 mg  100 mg Intravenous Daily Myriam Forehand, NP       topiramate (TOPAMAX) tablet 50 mg  50 mg Oral Daily Myriam Forehand, NP   50 mg at 11/21/22 [phone]  traZODone (DESYREL) tablet 100 mg  100 mg Oral QHS PRN Myriam Forehand, NP   100 mg at 11/20/22 2054    Lab Results: No results found for this or any previous visit (from the past 48 hour(s)).  Blood Alcohol level:  Lab Results  Component Value Date   ETH <10 11/17/2022   ETH <10 11/13/2022    Metabolic Disorder Labs: Lab Results  Component Value Date   HGBA1C 5.9 (A) 07/12/2022   MPG 117 02/02/2009   No results found for: "PROLACTIN" Lab Results  Component Value Date   CHOL 203 (H) 10/31/2022   TRIG 196 (H) 10/31/2022   HDL 84 10/31/2022   CHOLHDL 2.4 10/31/2022   VLDL 39 10/31/2022   LDLCALC 80 10/31/2022   LDLCALC 63 10/08/2014    Physical Findings: AIMS:  , ,  ,  ,    CIWA:  CIWA-Ar Total: 5 COWS:  COWS Total Score: 0  Musculoskeletal: Strength & Muscle Tone: within normal limits Gait & Station: normal Patient leans: N/A  Psychiatric Specialty Exam:  Presentation  General Appearance:  Fairly Groomed  Eye Contact: Good  Speech: Clear and Coherent; Normal Rate  Speech Volume: Normal  Handedness: Right   Mood and Affect  Mood: Anxious  Affect: Flat   Thought Process  Thought Processes: Coherent  Descriptions of Associations:Intact  Orientation:Full (Time, Place and Person)  Thought Content:WDL  History of Schizophrenia/Schizoaffective disorder:No  Duration of Psychotic Symptoms:Greater than six months  Hallucinations:Hallucinations: None  Ideas of Reference:None  Suicidal Thoughts:Suicidal Thoughts: No SI Active Intent and/or Plan: -- (denies) SI Passive Intent and/or Plan: -- (denies)  Homicidal Thoughts:Homicidal Thoughts: No   Sensorium  Memory: Immediate Good; Remote Good  Judgment: Poor  Insight: Fair   Art therapist  Concentration: Good  Attention Span: Fair  Recall: Good  Fund of Knowledge: Good  Language: Good   Psychomotor Activity  Psychomotor Activity:Psychomotor Activity:  Normal Extrapyramidal Side Effects (EPS): -- (none noted) AIMS Completed?: No   Assets  Assets: Manufacturing systems engineer; Desire for Improvement   Sleep  Sleep:Sleep: Good Number of Hours of Sleep: 8    Physical Exam: Physical Exam Vitals and nursing note reviewed.  Constitutional:      Appearance: Normal appearance.  HENT:     Head: Normocephalic and atraumatic.     Nose: Nose normal.  Pulmonary:     Effort: Pulmonary effort is normal.  Musculoskeletal:        General: Normal range of motion.     Cervical back: Normal range of motion.  Neurological:     General: No focal deficit present.     Mental Status: He is alert and oriented to person, place, and time. Mental status is at baseline.  Psychiatric:        Attention and Perception: Attention normal. He perceives auditory hallucinations.        Mood and Affect: Mood is anxious. Affect is flat.        Speech: Speech normal.        Behavior: Behavior normal. Behavior is cooperative.        Thought Content: Thought content normal.        Cognition and Memory: Cognition and memory normal.        Judgment: Judgment normal.    Review of Systems  All other systems reviewed and are negative.  Blood pressure 128/83, pulse 66, temperature 98.5 F (36.9 C), resp. rate 16, height 5\' 10"  (1.778 m), weight 67.1 kg, SpO2 100%.  Body mass index is 21.24 kg/m.   Treatment Plan Summary: Daily contact with patient to assess and evaluate symptoms and progress in treatment and Medication management Risperdal 1 mg nightly for mood stabilization. Cogentin 1 mg daily to address extrapyramidal symptoms or other medication side effects. Implement CIWA protocol to monitor and manage withdrawal symptoms. Prozac 20 mg daily for depression Administer Ativan as indicated for withdrawal-related agitation or anxiety. Start Coreg 3.25 mg daily for blood pressure management and cardiovascular stability. Monitor vital signs closely to assess for  any complications related to withdrawal or underlying medical issues. Coordinate with LCSW referral to a dual-diagnosis treatment program addressing both mental health and substance use.  Review the patient's dietary needs and consult with dietary staff to address the request for additional food portions. Ensure dietary intake aligns with health and treatment goals. Myriam Forehand, NP 11/21/2022, 7:38 PM

## 2022-11-21 NOTE — Progress Notes (Signed)
[facility] MD Progress Note  11/21/2022 1:46 PM Franklin [name]  MRN:  [id number] Subjective:   52 year old African American maleDenies suicidal ideation (SI), homicidal ideation (HI), self-injurious behavior (SIB), and delusions or hallucinations. Principal Problem: MDD (major depressive disorder) Diagnosis: Principal Problem:   MDD (major depressive disorder)  Total Time spent with patient: 45 minutes  Past Psychiatric History: Anxiety Depression  Past Medical History:  Past Medical History:  Diagnosis Date   Anxiety    Bipolar 1 disorder (HCC)    COPD (chronic obstructive pulmonary disease) (HCC)    Depression    Schizophrenia (HCC)    Substance abuse (HCC)     Past Surgical History:  Procedure Laterality Date   NO PAST SURGERIES     Family History:  Family History  Problem Relation Age of Onset   Hypertension Other    Diabetes Other    Family Psychiatric  History: none noted Social History:  Social History   Substance and Sexual Activity  Alcohol Use Yes   Alcohol/week: 12.0 standard drinks of alcohol   Types: 12 Cans of beer per week     Social History   Substance and Sexual Activity  Drug Use Yes   Types: "Crack" cocaine, Cocaine   Comment: one gram daily    Social History   Socioeconomic History   Marital status: Single    Spouse name: Not on file   Number of children: Not on file   Years of education: Not on file   Highest education level: Not on file  Occupational History   Occupation: [employer]  Tobacco Use   Smoking status: Some Days    Current packs/day: 0.50    Types: Cigarettes   Smokeless tobacco: Never  Vaping Use   Vaping status: Never Used  Substance and Sexual Activity   Alcohol use: Yes    Alcohol/week: 12.0 standard drinks of alcohol    Types: 12 Cans of beer per week   Drug use: Yes    Types: "Crack" cocaine, Cocaine    Comment: one gram daily   Sexual activity: Yes  Other Topics Concern   Not on file  Social History Narrative   The  patient was born and raised in with that by both his biological parents. His father's past way but his mother still living. He denies any history of any physical or sexual abuse. He says he completed 2 years of college at [employer]. 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 He has never been married but has a 55 year old son who lives with his mother. He says he does get to see his son and has a relationship with him. He is not currently dating or in a relationship.      The patient does have a history of a DUI and had a court date last Friday for a DUI. He denies any other pending charges.            Social Determinants of Health   Financial Resource Strain: Not on file  Food Insecurity: Food Insecurity Present (11/17/2022)   Hunger Vital Sign    Worried About Running Out of Food in the Last Year: Sometimes true    Ran Out of Food in the Last Year: Sometimes true  Transportation Needs: Unmet Transportation Needs (11/17/2022)   PRAPARE - Administrator, Civil Service (Medical): Yes    Lack of Transportation (Non-Medical): Yes  Physical  Activity: Not on file  Stress: Not on file  Social Connections: Unknown (10/10/2022)   Received from [facility]   Social Network    Social Network: Not on file   Additional Social History:                         Sleep: Good  Appetite:  Good  Current Medications: Current Facility-Administered Medications  Medication Dose Route Frequency Provider Last Rate Last Admin   acetaminophen (TYLENOL) tablet 650 mg  650 mg Oral Q6H PRN Penn, Cicely, NP       albuterol (VENTOLIN HFA) 108 (90 Base) MCG/ACT inhaler 2 puff  2 puff Inhalation Q4H PRN Myriam Forehand, NP       alum & mag hydroxide-simeth (MAALOX/MYLANTA) 200-200-20 MG/5ML suspension 30 mL  30 mL Oral Q4H PRN Penn, Cranston Neighbor, NP   30 mL at 11/20/22 1848   benztropine (COGENTIN) tablet 1 mg  1 mg Oral  Daily Myriam Forehand, NP   1 mg at 11/21/22 0912   carvedilol (COREG) tablet 3.125 mg  3.125 mg Oral BID WC Myriam Forehand, NP   3.125 mg at 11/21/22 1713   diphenhydrAMINE (BENADRYL) capsule 50 mg  50 mg Oral TID PRN Mcneil Sober, NP       Or   diphenhydrAMINE (BENADRYL) injection 50 mg  50 mg Intramuscular TID PRN Penn, Cranston Neighbor, NP       feeding supplement (ENSURE ENLIVE / ENSURE PLUS) liquid 237 mL  1 Bottle Oral BID BM Myriam Forehand, NP   237 mL at 11/21/22 1744   FLUoxetine (PROZAC) capsule 20 mg  20 mg Oral Daily Myriam Forehand, NP   20 mg at 11/21/22 1610   folic acid (FOLVITE) tablet 1 mg  1 mg Oral Daily Myriam Forehand, NP   1 mg at 11/21/22 9604   haloperidol (HALDOL) tablet 5 mg  5 mg Oral TID PRN Mcneil Sober, NP       Or   haloperidol lactate (HALDOL) injection 5 mg  5 mg Intramuscular TID PRN Penn, Cranston Neighbor, NP       magnesium hydroxide (MILK OF MAGNESIA) suspension 30 mL  30 mL Oral Daily PRN Penn, Cranston Neighbor, NP       mometasone-formoterol (DULERA) 200-5 MCG/ACT inhaler 2 puff  2 puff Inhalation BID Myriam Forehand, NP   2 puff at 11/19/22 2100   multivitamin with minerals tablet 1 tablet  1 tablet Oral Daily Myriam Forehand, NP   1 tablet at 11/21/22 5409   nicotine polacrilex (NICORETTE) gum 2 mg  2 mg Oral PRN Myriam Forehand, NP       risperiDONE (RISPERDAL M-TABS) disintegrating tablet 1 mg  1 mg Oral QHS Myriam Forehand, NP   1 mg at 11/20/22 2054   thiamine (VITAMIN B1) tablet 100 mg  100 mg Oral Daily Myriam Forehand, NP   100 mg at 11/20/22 8119   Or   thiamine (VITAMIN B1) injection 100 mg  100 mg Intravenous Daily Myriam Forehand, NP       topiramate (TOPAMAX) tablet 50 mg  50 mg Oral Daily Myriam Forehand, NP   50 mg at 11/21/22 0910   traZODone (DESYREL) tablet 100 mg  100 mg Oral QHS PRN Myriam Forehand, NP   100 mg at 11/20/22 2054    Lab Results: No results found for this or any previous visit (from the past 48 hour(s)).  Blood Alcohol level:  Lab Results  Component Value Date   ETH  <10 11/17/2022   ETH <10 11/13/2022    Metabolic Disorder Labs: Lab Results  Component Value Date   HGBA1C 5.9 (A) 07/12/2022   MPG 117 02/02/2009   No results found for: "PROLACTIN" Lab Results  Component Value Date   CHOL 203 (H) 10/31/2022   TRIG 196 (H) 10/31/2022   HDL 84 10/31/2022   CHOLHDL 2.4 10/31/2022   VLDL 39 10/31/2022   LDLCALC 80 10/31/2022   LDLCALC 63 10/08/2014    Physical Findings: AIMS:  , ,  ,  ,    CIWA:  CIWA-Ar Total: 5 COWS:  COWS Total Score: 0  Musculoskeletal: Strength & Muscle Tone: within normal limits Gait & Station: normal Patient leans: N/A  Psychiatric Specialty Exam:  Presentation  General Appearance:  Fairly Groomed  Eye Contact: Good  Speech: Clear and Coherent; Normal Rate  Speech Volume: Normal  Handedness: Right   Mood and Affect  Mood: Anxious  Affect: Flat   Thought Process  Thought Processes: Coherent  Descriptions of Associations:Intact  Orientation:Full (Time, Place and Person)  Thought Content:WDL  History of Schizophrenia/Schizoaffective disorder:yes Duration of Psychotic Symptoms:Greater than six months  Hallucinations:Hallucinations: None  Ideas of Reference:None  Suicidal Thoughts:Suicidal Thoughts: No SI Active Intent and/or Plan: -- (denies) SI Passive Intent and/or Plan: -- (denies)  Homicidal Thoughts:Homicidal Thoughts: No   Sensorium  Memory: Immediate Good; Remote Good  Judgment: Poor  Insight: Fair   Art therapist  Concentration: Good  Attention Span: Fair  Recall: Good  Fund of Knowledge: Good  Language: Good   Psychomotor Activity  Psychomotor Activity: Psychomotor Activity: Normal Extrapyramidal Side Effects (EPS): -- (none noted) AIMS Completed?: No   Assets  Assets: Manufacturing systems engineer; Desire for Improvement   Sleep  Sleep: Sleep: Good Number of Hours of Sleep: 8    Physical Exam: Physical Exam Vitals and nursing  note reviewed.  Constitutional:      Appearance: Normal appearance.  HENT:     Head: Normocephalic and atraumatic.     Nose: Nose normal.  Pulmonary:     Effort: Pulmonary effort is normal.  Musculoskeletal:        General: Normal range of motion.  Neurological:     Mental Status: He is alert.  Psychiatric:        Attention and Perception: Attention and perception normal.        Mood and Affect: Mood and affect normal.        Speech: Speech normal.        Behavior: Behavior normal. Behavior is cooperative.        Thought Content: Thought content normal.        Cognition and Memory: Cognition and memory normal.        Judgment: Judgment normal.    Review of Systems  All other systems reviewed and are negative.  Blood pressure 128/83, pulse 66, temperature 98.5 F (36.9 C), resp. rate 16, height 5\' 10"  (1.778 m), weight 67.1 kg, SpO2 100%. Body mass index is 21.24 kg/m.   Treatment Plan Summary: Daily contact with patient to assess and evaluate symptoms and progress in treatment and Medication management Risperdal 1 mg nightly for mood stabilization. Cogentin 1 mg daily to address extrapyramidal symptoms or other medication side effects. Implement CIWA protocol to monitor and manage withdrawal symptoms. Prozac 20 mg daily for depression Administer Ativan as indicated for withdrawal-related agitation or anxiety. Start Coreg 3.25 mg  daily for blood pressure management and cardiovascular stability. Monitor vital signs closely to assess for any complications related to withdrawal or underlying medical issues. Coordinate with LCSW referral to a dual-diagnosis treatment program addressing both mental health and substance use.  Review the patient's dietary needs and consult with dietary staff to address the request for additional food portions. Ensure dietary intake aligns with health and treatment goals. Myriam Forehand, NP 11/21/2022, 8:06 PM

## 2022-11-21 NOTE — Progress Notes (Signed)
D- Patient alert and oriented x 4. Affect flat/mood congruent. Denies SI/ HI/ AVH. Patient denies pain. Patient endorses depression and anxiety. His goals are to "speak with social work about discharge and be engaged". A- Scheduled medications administered to patient, per MD orders. Support and encouragement provided.  Routine safety checks conducted every 15 minutes without incident.  Patient informed to notify staff with problems or concerns and verbalizes understanding. R- No adverse drug reactions noted.  Patient compliant with medications and treatment plan. Patient receptive, calm and cooperative. He interacts well with others on the unit.  Patient contracts for safety and  remains safe on the unit at this time.

## 2022-11-22 DIAGNOSIS — F339 Major depressive disorder, recurrent, unspecified: Secondary | ICD-10-CM | POA: Diagnosis not present

## 2022-11-22 NOTE — Plan of Care (Signed)
Patient verbalized passive suicidal ideation but feels some positive energy from the medications this morning. Patient contracts for safety. Patient appropriate with staff & peers.Denies SI,HI and AVH at this time. Compliant with medications. Attended groups. Appetite and energy level good. Support and encouragement given.

## 2022-11-22 NOTE — Group Note (Signed)
Date:  11/22/2022 Time:  10:09 AM  Group Topic/Focus:  Goals Group:   The focus of this group is to help patients establish daily goals to achieve during treatment and discuss how the patient can incorporate goal setting into their daily lives to aide in recovery. Relapse Prevention Planning:   The focus of this group is to define relapse and discuss the need for planning to combat relapse.    Participation Level:  Did Not Attend   Franklin [name] 11/22/2022, 10:09 AM

## 2022-11-22 NOTE — Progress Notes (Signed)
   11/21/22 2000  Psych Admission Type (Psych Patients Only)  Admission Status Involuntary  Psychosocial Assessment  Patient Complaints Anxiety  Eye Contact Fair  Facial Expression Animated  Affect Anxious  Speech Logical/coherent  Interaction Assertive  Motor Activity Slow  Appearance/Hygiene In scrubs  Behavior Characteristics Cooperative  Mood Depressed  Thought Process  Coherency WDL  Content WDL  Delusions None reported or observed  Perception WDL  Hallucination None reported or observed  Judgment WDL  Confusion None  Danger to Self  Current suicidal ideation? Denies  Agreement Not to Harm Self Yes  Description of Agreement Verbal

## 2022-11-22 NOTE — BHH Counselor (Signed)
CSW contacted [facility] about application. CSW left HIPAA compliant voicemail requesting a return call.  CSW called [facility] to follow up on referral.  CSW was informed that pt has court on 12/08/2022 that will need to be resolved, however, patient can call and complete a prescreen.  CSW provided patient with the information.  Penni Homans, MSW, LCSW 11/22/2022 1:18 PM

## 2022-11-22 NOTE — BHH Counselor (Signed)
Patient has been accepted to [facility].  Bed is available Wednesday at 9AM.  Patient will need a 10 day supply of medications.  Penni Homans, MSW, LCSW 11/22/2022 3:17 PM

## 2022-11-22 NOTE — Progress Notes (Signed)
[name] MD Progress Note  11/22/2022 3:22 PM Franklin [name]  MRN:  [id number] Subjective:  Pt chart reviewed, discussed with interdisciplinary team, and seen on rounds. Endorses "frustrated" mood today. Pt is concerned about discharge plans. Denies suicidal, homicidal ideations. Denies auditory visual hallucinations or paranoia. Endorses good sleep and appetite. Feels he can keep himself safe if he is accepted into a substance use residential facility.   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; polysubstance abuse; bipolar 1 disorder; major depressive disorder; cocaine use disorder; tobacco abuse; generlized anxiety disorder  Past Medical History:  Past Medical History:  Diagnosis Date   Anxiety    Bipolar 1 disorder (HCC)    COPD (chronic obstructive pulmonary disease) (HCC)    Depression    Schizophrenia (HCC)    Substance abuse (HCC)     Past Surgical History:  Procedure Laterality Date   NO PAST SURGERIES     Family History:  Family History  Problem Relation Age of Onset   Hypertension Other    Diabetes Other    Family Psychiatric  History: none reported Social History:  Social History   Substance and Sexual Activity  Alcohol Use Yes   Alcohol/week: 12.0 standard drinks of alcohol   Types: 12 Cans of beer per week     Social History   Substance and Sexual Activity  Drug Use Yes   Types: "Crack" cocaine, Cocaine   Comment: one gram daily    Social History   Socioeconomic History   Marital status: Single    Spouse name: Not on file   Number of children: Not on file   Years of education: Not on file   Highest education level: Not on file  Occupational History   Occupation: [employer]  Tobacco Use   Smoking status: Some Days    Current  packs/day: 0.50    Types: Cigarettes   Smokeless tobacco: Never  Vaping Use   Vaping status: Never Used  Substance and Sexual Activity   Alcohol use: Yes    Alcohol/week: 12.0 standard drinks of alcohol    Types: 12 Cans of beer per week   Drug use: Yes    Types: "Crack" cocaine, Cocaine    Comment: one gram daily   Sexual activity: Yes  Other Topics Concern   Not on file  Social History Narrative   The patient was born and raised in with that by both his biological parents. His father's past way but his mother still living. He denies any history of any physical or sexual abuse. He says he completed 2 years of college at [employer]. 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 He has never been married but has a 2 year old son who lives with his mother. He says he does get to see his son and has a relationship with him. He is not currently dating or in a relationship.      The patient does have a history of a DUI and had a court date last Friday for a DUI. He denies any other pending charges.            Social Determinants of Health   Financial Resource Strain: Not on file  Food Insecurity: Food Insecurity Present (11/17/2022)  Hunger Vital Sign    Worried About Running Out of Food in the Last Year: Sometimes true    Ran Out of Food in the Last Year: Sometimes true  Transportation Needs: Unmet Transportation Needs (11/17/2022)   PRAPARE - Administrator, Civil Service (Medical): Yes    Lack of Transportation (Non-Medical): Yes  Physical Activity: Not on file  Stress: Not on file  Social Connections: Unknown (10/10/2022)   Received from [employer]   Social Network    Social Network: Not on file   Sleep: Good  Appetite:  Good  Current Medications: Current Facility-Administered Medications  Medication Dose Route Frequency Provider Last Rate Last Admin   acetaminophen (TYLENOL)  tablet 650 mg  650 mg Oral Q6H PRN Penn, Cranston Neighbor, NP   650 mg at 11/21/22 2042   albuterol (VENTOLIN HFA) 108 (90 Base) MCG/ACT inhaler 2 puff  2 puff Inhalation Q4H PRN Myriam Forehand, NP       alum & mag hydroxide-simeth (MAALOX/MYLANTA) 200-200-20 MG/5ML suspension 30 mL  30 mL Oral Q4H PRN Penn, Cranston Neighbor, NP   30 mL at 11/20/22 1848   benztropine (COGENTIN) tablet 1 mg  1 mg Oral Daily Myriam Forehand, NP   1 mg at 11/22/22 7564   carvedilol (COREG) tablet 3.125 mg  3.125 mg Oral BID WC Myriam Forehand, NP   3.125 mg at 11/22/22 3329   diphenhydrAMINE (BENADRYL) capsule 50 mg  50 mg Oral TID PRN Mcneil Sober, NP       Or   diphenhydrAMINE (BENADRYL) injection 50 mg  50 mg Intramuscular TID PRN Penn, Cranston Neighbor, NP       feeding supplement (ENSURE ENLIVE / ENSURE PLUS) liquid 237 mL  1 Bottle Oral BID BM Myriam Forehand, NP   237 mL at 11/22/22 1038   FLUoxetine (PROZAC) capsule 20 mg  20 mg Oral Daily Myriam Forehand, NP   20 mg at 11/22/22 5188   folic acid (FOLVITE) tablet 1 mg  1 mg Oral Daily Myriam Forehand, NP   1 mg at 11/22/22 4166   haloperidol (HALDOL) tablet 5 mg  5 mg Oral TID PRN Mcneil Sober, NP       Or   haloperidol lactate (HALDOL) injection 5 mg  5 mg Intramuscular TID PRN Penn, Cranston Neighbor, NP       magnesium hydroxide (MILK OF MAGNESIA) suspension 30 mL  30 mL Oral Daily PRN Penn, Cicely, NP       mometasone-formoterol (DULERA) 200-5 MCG/ACT inhaler 2 puff  2 puff Inhalation BID Myriam Forehand, NP   2 puff at 11/19/22 2100   multivitamin with minerals tablet 1 tablet  1 tablet Oral Daily Myriam Forehand, NP   1 tablet at 11/22/22 0630   nicotine polacrilex (NICORETTE) gum 2 mg  2 mg Oral PRN Myriam Forehand, NP       risperiDONE (RISPERDAL M-TABS) disintegrating tablet 1 mg  1 mg Oral QHS Myriam Forehand, NP   1 mg at 11/21/22 2040   thiamine (VITAMIN B1) tablet 100 mg  100 mg Oral Daily Myriam Forehand, NP   100 mg at 11/22/22 1601   Or   thiamine (VITAMIN B1) injection 100 mg  100 mg Intravenous Daily  Myriam Forehand, NP       topiramate (TOPAMAX) tablet 50 mg  50 mg Oral Daily Myriam Forehand, NP   50 mg at 11/22/22 0932   traZODone (DESYREL) tablet  100 mg  100 mg Oral QHS PRN Myriam Forehand, NP   100 mg at 11/21/22 2040    Lab Results: No results found for this or any previous visit (from the past 48 hour(s)).  Blood Alcohol level:  Lab Results  Component Value Date   ETH <10 11/17/2022   ETH <10 11/13/2022    Metabolic Disorder Labs: Lab Results  Component Value Date   HGBA1C 5.9 (A) 07/12/2022   MPG 117 02/02/2009   No results found for: "PROLACTIN" Lab Results  Component Value Date   CHOL 203 (H) 10/31/2022   TRIG 196 (H) 10/31/2022   HDL 84 10/31/2022   CHOLHDL 2.4 10/31/2022   VLDL 39 10/31/2022   LDLCALC 80 10/31/2022   LDLCALC 63 10/08/2014    Physical Findings: AIMS:  , ,  ,  ,    CIWA:  CIWA-Ar Total: 5 COWS:  COWS Total Score: 0  Musculoskeletal: Strength & Muscle Tone: within normal limits Gait & Station: normal Patient leans: N/A  Psychiatric Specialty Exam:  Presentation  General Appearance:  Appropriate for Environment; Fairly Groomed  Eye Contact: Good  Speech: Clear and Coherent; Normal Rate  Speech Volume: Normal  Handedness: Right   Mood and Affect  Mood: -- ("frustrated")  Affect: Blunt   Thought Process  Thought Processes: Coherent; Goal Directed; Linear  Descriptions of Associations:Intact  Orientation:Full (Time, Place and Person)  Thought Content:Logical  History of Schizophrenia/Schizoaffective disorder:No  Duration of Psychotic Symptoms:Greater than six months  Hallucinations:Hallucinations: None  Ideas of Reference:None  Suicidal Thoughts:Suicidal Thoughts: No  Homicidal Thoughts:Homicidal Thoughts: No   Sensorium  Memory: Immediate Good; Recent Good  Judgment: Fair  Insight: Fair   Art therapist  Concentration: Good  Attention Span: Good  Recall: Good  Fund of  Knowledge: Good  Language: Good   Psychomotor Activity  Psychomotor Activity: Psychomotor Activity: Normal  Assets  Assets: Communication Skills; Desire for Improvement; Financial Resources/Insurance; Resilience  Sleep  Sleep: Sleep: Good  Physical Exam: Physical Exam Constitutional:      General: He is not in acute distress.    Appearance: He is not ill-appearing, toxic-appearing or diaphoretic.  Eyes:     General: No scleral icterus. Cardiovascular:     Rate and Rhythm: Normal rate.  Pulmonary:     Effort: Pulmonary effort is normal. No respiratory distress.  Neurological:     Mental Status: He is alert and oriented to person, place, and time.  Psychiatric:        Attention and Perception: Attention and perception normal.        Mood and Affect: Affect is blunt.        Speech: Speech normal.        Behavior: Behavior normal. Behavior is cooperative.        Thought Content: Thought content normal.        Cognition and Memory: Cognition and memory normal.        Judgment: Judgment normal.    Review of Systems  Constitutional:  Negative for chills and fever.  Respiratory:  Negative for shortness of breath.   Cardiovascular:  Negative for chest pain and palpitations.  Gastrointestinal:  Negative for abdominal pain.  Neurological:  Negative for headaches.  Psychiatric/Behavioral: Negative.     Blood pressure 115/72, pulse 62, temperature 97.9 F (36.6 C), resp. rate 16, height 5\' 10"  (1.778 m), weight 67.1 kg, SpO2 98%. Body mass index is 21.24 kg/m.   Treatment Plan Summary: Daily contact with patient to  assess and evaluate symptoms and progress in treatment, Medication management, and Plan    -Continue cogentin 1mg  oral daily -Continue benadryl 50mg  oral or IM 3 times daily PRN agitation -Continue prozac 20mg  oral daily -Continue folvite 1mg  oral daily -Continue haldol 5mg  oral or IM 3 times daily PRN agitation -Continue multivitamin with minerals tablet  1 tablet oral daily -Continue nicorette gum 2 mg oral as needed smoking cessation -Continue risperdal 1mg  oral daily at bedtime -Continue thiamine 100mg  oral or IV daily -Continue topamax 50mg  oral daily -Continue trazodone 100mg  oral at bedtime PRN sleep  Lauree Chandler, NP 11/22/2022, 3:22 PM

## 2022-11-22 NOTE — Group Note (Signed)
Date:  11/22/2022 Time:  5:21 PM  Group Topic/Focus:  Healthy Communication:   The focus of this group is to discuss communication, barriers to communication, as well as healthy ways to communicate with others. Outdoor recreation structured activity Outdoor therapy and exercise   Participation Level:  Active  Participation Quality:  Appropriate  Affect:  Appropriate  Cognitive:  Appropriate  Insight: Appropriate  Engagement in Group:  Developing/Improving  Modes of Intervention:  Activity  Additional Comments:    Cherissa Hook 11/22/2022, 5:21 PM

## 2022-11-22 NOTE — Group Note (Signed)
Recreation Therapy Group Note   Group Topic:Coping Skills  Group Date: 11/22/2022 Start Time: 1000 End Time: 1050 Facilitators: Rosina Lowenstein, LRT, CTRS Location:  Craft Room  Group Description: Mind Map.  Patient was provided a blank template of a diagram with 32 blank boxes in a tiered system, branching from the center (similar to a bubble chart). LRT directed patients to label the middle of the diagram "Coping Skills". LRT and patients then came up with 8 different coping skills as examples. Pt were directed to record their coping skills in the 2nd tier boxes closest to the center.  Patients would then share their coping skills with the group as LRT wrote them out. LRT gave a handout of 99 different coping skills at the end of group.   Goal Area(s) Addressed: Patients will be able to define "coping skills". Patient will identify new coping skills.  Patient will increase communication.   Affect/Mood: N/A   Participation Level: Did not attend    Clinical Observations/Individualized Feedback: Franklin [name] did not attend group.   Plan: Continue to engage patient in RT group sessions 2-3x/week.   Rosina Lowenstein, LRT, CRS 11/22/2022 12:30 PM

## 2022-11-23 ENCOUNTER — Other Ambulatory Visit: Payer: Self-pay

## 2022-11-23 ENCOUNTER — Encounter: Payer: Self-pay | Admitting: Psychiatry

## 2022-11-23 DIAGNOSIS — F332 Major depressive disorder, recurrent severe without psychotic features: Secondary | ICD-10-CM | POA: Diagnosis not present

## 2022-11-23 MED ORDER — TRAZODONE HCL 100 MG PO TABS
100.0000 mg | ORAL_TABLET | Freq: Every evening | ORAL | 0 refills | Status: DC | PRN
  Filled 2022-11-23: qty 10, 10d supply, fill #0

## 2022-11-23 MED ORDER — TOPIRAMATE 50 MG PO TABS
50.0000 mg | ORAL_TABLET | Freq: Every day | ORAL | 0 refills | Status: DC
  Filled 2022-11-23: qty 10, 10d supply, fill #0

## 2022-11-23 MED ORDER — RISPERIDONE 1 MG PO TABS
1.0000 mg | ORAL_TABLET | Freq: Every day | ORAL | 0 refills | Status: DC
  Filled 2022-11-23: qty 10, 10d supply, fill #0

## 2022-11-23 MED ORDER — BENZTROPINE MESYLATE 1 MG PO TABS
1.0000 mg | ORAL_TABLET | Freq: Every day | ORAL | 0 refills | Status: DC
  Filled 2022-11-23: qty 10, 10d supply, fill #0

## 2022-11-23 MED ORDER — CARVEDILOL 3.125 MG PO TABS
3.1250 mg | ORAL_TABLET | Freq: Two times a day (BID) | ORAL | 0 refills | Status: DC
  Filled 2022-11-23: qty 10, 5d supply, fill #0

## 2022-11-23 MED ORDER — MOMETASONE FURO-FORMOTEROL FUM 200-5 MCG/ACT IN AERO
2.0000 | INHALATION_SPRAY | Freq: Two times a day (BID) | RESPIRATORY_TRACT | 0 refills | Status: DC
  Filled 2022-11-23: qty 1, 10d supply, fill #0

## 2022-11-23 MED ORDER — FLUOXETINE HCL 20 MG PO CAPS
20.0000 mg | ORAL_CAPSULE | Freq: Every day | ORAL | 0 refills | Status: DC
  Filled 2022-11-23: qty 10, 10d supply, fill #0

## 2022-11-23 NOTE — Group Note (Signed)
LCSW Group Therapy Note  Group Date: 11/23/2022 Start Time: 1300 End Time: 1400   Type of Therapy and Topic:  Group Therapy: Anger Cues and Responses  Participation Level:  Did Not Attend   Description of Group:   In this group, patients learned how to recognize the physical, cognitive, emotional, and behavioral responses they have to anger-provoking situations.  They identified a recent time they became angry and how they reacted.  They analyzed how their reaction was possibly beneficial and how it was possibly unhelpful.  The group discussed a variety of healthier coping skills that could help with such a situation in the future.  Focus was placed on how helpful it is to recognize the underlying emotions to our anger, because working on those can lead to a more permanent solution as well as our ability to focus on the important rather than the urgent.  Therapeutic Goals: Patients will remember their last incident of anger and how they felt emotionally and physically, what their thoughts were at the time, and how they behaved. Patients will identify how their behavior at that time worked for them, as well as how it worked against them. Patients will explore possible new behaviors to use in future anger situations. Patients will learn that anger itself is normal and cannot be eliminated, and that healthier reactions can assist with resolving conflict rather than worsening situations.  Summary of Patient Progress:  X  Therapeutic Modalities:   Cognitive Behavioral Therapy    Harden Mo, LCSWA 11/23/2022  2:43 PM

## 2022-11-23 NOTE — Progress Notes (Signed)
   11/23/22 1228  Psych Admission Type (Psych Patients Only)  Admission Status Involuntary  Psychosocial Assessment  Patient Complaints None  Eye Contact Fair  Facial Expression Animated  Affect Appropriate to circumstance  Speech Logical/coherent  Interaction Assertive  Motor Activity Slow  Appearance/Hygiene In scrubs  Behavior Characteristics Appropriate to situation  Mood Pleasant  Thought Process  Coherency WDL  Content WDL  Delusions None reported or observed  Perception WDL  Hallucination None reported or observed  Judgment Impaired  Confusion None  Danger to Self  Current suicidal ideation? Passive;Denies  Self-Injurious Behavior No self-injurious ideation or behavior indicators observed or expressed   Agreement Not to Harm Self Yes  Description of Agreement verbal  Danger to Others  Danger to Others None reported or observed

## 2022-11-23 NOTE — Progress Notes (Signed)
[facility] MD Progress Note  11/23/2022 3:36 PM Franklin [name]  MRN:  [id number]  Subjective:  Pt chart reviewed, discussed with interdisciplinary team, and seen on rounds. Patient is preparing to go to [facility] tomorrow to continue his recovery.  Moderate depression with no suicidal ideations.  Mild anxiety with no panic attacks.  Sleep and appetite are "good".  Denies side effects from his medications.  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; polysubstance abuse; major depressive disorder; cocaine use disorder; tobacco abuse; generlized anxiety disorder  Past Medical History:  Past Medical History:  Diagnosis Date   Anxiety    COPD (chronic obstructive pulmonary disease) (HCC)    Depression    Substance abuse (HCC)     Past Surgical History:  Procedure Laterality Date   NO PAST SURGERIES     Family History:  Family History  Problem Relation Age of Onset   Hypertension Other    Diabetes Other    Family Psychiatric  History: none reported Social History:  Social History   Substance and Sexual Activity  Alcohol Use Yes   Alcohol/week: 12.0 standard drinks of alcohol   Types: 12 Cans of beer per week     Social History   Substance and Sexual Activity  Drug Use Yes   Types: "Crack" cocaine, Cocaine   Comment: one gram daily    Social History   Socioeconomic History   Marital status: Single    Spouse name: Not on file   Number of children: Not on file   Years of education: Not on file   Highest education level: Not on file  Occupational History   Occupation: [employer]  Tobacco Use   Smoking status: Some Days    Current packs/day: 0.50    Types: Cigarettes    Smokeless tobacco: Never  Vaping Use   Vaping status: Never Used  Substance and Sexual Activity   Alcohol use: Yes    Alcohol/week: 12.0 standard drinks of alcohol    Types: 12 Cans of beer per week   Drug use: Yes    Types: "Crack" cocaine, Cocaine    Comment: one gram daily   Sexual activity: Yes  Other Topics Concern   Not on file  Social History Narrative   The patient was born and raised in with that by both his biological parents. His father's past way but his mother still living. He denies any history of any physical or sexual abuse. He says he completed 2 years of college at [employer]. 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 He has never been married but has a 19 year old son who lives with his mother. He says he does get to see his son and has a relationship with him. He is not currently dating or in a relationship.      The patient does have a history of a DUI and had a court date last Friday for a DUI. He denies any other pending charges.            Social Determinants of Health   Financial Resource Strain: Not on file  Food Insecurity: Food Insecurity Present (11/17/2022)   Hunger Vital Sign    Worried  About Running Out of Food in the Last Year: Sometimes true    Ran Out of Food in the Last Year: Sometimes true  Transportation Needs: Unmet Transportation Needs (11/17/2022)   PRAPARE - Administrator, Civil Service (Medical): Yes    Lack of Transportation (Non-Medical): Yes  Physical Activity: Not on file  Stress: Not on file  Social Connections: Unknown (10/10/2022)   Received from [employer]   Social Network    Social Network: Not on file   Sleep: Good  Appetite:  Good  Current Medications: Current Facility-Administered Medications  Medication Dose Route Frequency Provider Last Rate Last Admin   acetaminophen (TYLENOL) tablet 650 mg  650 mg Oral Q6H PRN Penn,  Cranston Neighbor, NP   650 mg at 11/21/22 2042   albuterol (VENTOLIN HFA) 108 (90 Base) MCG/ACT inhaler 2 puff  2 puff Inhalation Q4H PRN Myriam Forehand, NP       alum & mag hydroxide-simeth (MAALOX/MYLANTA) 200-200-20 MG/5ML suspension 30 mL  30 mL Oral Q4H PRN Penn, Cranston Neighbor, NP   30 mL at 11/22/22 2121   benztropine (COGENTIN) tablet 1 mg  1 mg Oral Daily Myriam Forehand, NP   1 mg at 11/23/22 0814   carvedilol (COREG) tablet 3.125 mg  3.125 mg Oral BID WC Myriam Forehand, NP   3.125 mg at 11/23/22 4403   diphenhydrAMINE (BENADRYL) capsule 50 mg  50 mg Oral TID PRN Mcneil Sober, NP       Or   diphenhydrAMINE (BENADRYL) injection 50 mg  50 mg Intramuscular TID PRN Penn, Cranston Neighbor, NP       feeding supplement (ENSURE ENLIVE / ENSURE PLUS) liquid 237 mL  1 Bottle Oral BID BM Myriam Forehand, NP   237 mL at 11/22/22 1659   FLUoxetine (PROZAC) capsule 20 mg  20 mg Oral Daily Myriam Forehand, NP   20 mg at 11/23/22 0815   folic acid (FOLVITE) tablet 1 mg  1 mg Oral Daily Myriam Forehand, NP   1 mg at 11/23/22 4742   haloperidol (HALDOL) tablet 5 mg  5 mg Oral TID PRN Mcneil Sober, NP       Or   haloperidol lactate (HALDOL) injection 5 mg  5 mg Intramuscular TID PRN Penn, Cranston Neighbor, NP       magnesium hydroxide (MILK OF MAGNESIA) suspension 30 mL  30 mL Oral Daily PRN Penn, Cranston Neighbor, NP       mometasone-formoterol (DULERA) 200-5 MCG/ACT inhaler 2 puff  2 puff Inhalation BID Myriam Forehand, NP   2 puff at 11/22/22 2114   multivitamin with minerals tablet 1 tablet  1 tablet Oral Daily Myriam Forehand, NP   1 tablet at 11/23/22 5956   nicotine polacrilex (NICORETTE) gum 2 mg  2 mg Oral PRN Myriam Forehand, NP       risperiDONE (RISPERDAL M-TABS) disintegrating tablet 1 mg  1 mg Oral QHS Myriam Forehand, NP   1 mg at 11/22/22 2115   thiamine (VITAMIN B1) tablet 100 mg  100 mg Oral Daily Myriam Forehand, NP   100 mg at 11/23/22 3875   Or   thiamine (VITAMIN B1) injection 100 mg  100 mg Intravenous Daily Myriam Forehand, NP       topiramate  (TOPAMAX) tablet 50 mg  50 mg Oral Daily Myriam Forehand, NP   50 mg at 11/23/22 0814   traZODone (DESYREL) tablet 100 mg  100 mg Oral QHS  PRN Myriam Forehand, NP   100 mg at 11/22/22 2115    Lab Results: No results found for this or any previous visit (from the past 48 hour(s)).  Blood Alcohol level:  Lab Results  Component Value Date   ETH <10 11/17/2022   ETH <10 11/13/2022    Metabolic Disorder Labs: Lab Results  Component Value Date   HGBA1C 5.9 (A) 07/12/2022   MPG 117 02/02/2009   No results found for: "PROLACTIN" Lab Results  Component Value Date   CHOL 203 (H) 10/31/2022   TRIG 196 (H) 10/31/2022   HDL 84 10/31/2022   CHOLHDL 2.4 10/31/2022   VLDL 39 10/31/2022   LDLCALC 80 10/31/2022   LDLCALC 63 10/08/2014    Physical Findings: AIMS:  , ,  ,  ,    CIWA:  CIWA-Ar Total: 5 COWS:  COWS Total Score: 0  Musculoskeletal: Strength & Muscle Tone: within normal limits Gait & Station: normal Patient leans: N/A  Psychiatric Specialty Exam: Physical Exam Constitutional:      General: He is not in acute distress.    Appearance: Normal appearance. He is not ill-appearing, toxic-appearing or diaphoretic.  HENT:     Head: Normocephalic.     Nose: Nose normal.  Eyes:     General: No scleral icterus. Pulmonary:     Effort: Pulmonary effort is normal. No respiratory distress.  Musculoskeletal:        General: Normal range of motion.     Cervical back: Normal range of motion.  Neurological:     General: No focal deficit present.     Mental Status: He is alert and oriented to person, place, and time.  Psychiatric:        Attention and Perception: Attention and perception normal.        Mood and Affect: Affect is blunt.        Speech: Speech normal.        Behavior: Behavior normal. Behavior is cooperative.        Thought Content: Thought content normal.        Cognition and Memory: Cognition and memory normal.        Judgment: Judgment normal.     Review of  Systems  Constitutional:  Negative for chills and fever.  Respiratory:  Negative for shortness of breath.   Cardiovascular:  Negative for chest pain and palpitations.  Gastrointestinal:  Negative for abdominal pain.  Neurological:  Negative for headaches.  Psychiatric/Behavioral:  Positive for depression and substance abuse. The patient is nervous/anxious.   All other systems reviewed and are negative.   Blood pressure 130/83, pulse 64, temperature 98.7 F (37.1 C), resp. rate (!) 23, height 5\' 10"  (1.778 m), weight 67.1 kg, SpO2 98%.Body mass index is 21.24 kg/m.  General Appearance: Casual  Eye Contact:  Good  Speech:  Normal Rate  Volume:  Normal  Mood:  Anxious and Depressed  Affect:  Congruent  Thought Process:  Coherent  Orientation:  Full (Time, Place, and Person)  Thought Content:  WDL and Logical  Suicidal Thoughts:  No  Homicidal Thoughts:  No  Memory:  Immediate;   Good Recent;   Good Remote;   Good  Judgement:  Fair  Insight:  Good  Psychomotor Activity:  Normal  Concentration:  Concentration: Good and Attention Span: Good  Recall:  Good  Fund of Knowledge:  Good  Language:  Good  Akathisia:  No  Handed:  Right  AIMS (if indicated):  Assets:  Leisure Time Physical Health Resilience Social Support  ADL's:  Intact  Cognition:  WNL  Sleep:        Physical Exam: Physical Exam Constitutional:      General: He is not in acute distress.    Appearance: Normal appearance. He is not ill-appearing, toxic-appearing or diaphoretic.  HENT:     Head: Normocephalic.     Nose: Nose normal.  Eyes:     General: No scleral icterus. Pulmonary:     Effort: Pulmonary effort is normal. No respiratory distress.  Musculoskeletal:        General: Normal range of motion.     Cervical back: Normal range of motion.  Neurological:     General: No focal deficit present.     Mental Status: He is alert and oriented to person, place, and time.  Psychiatric:         Attention and Perception: Attention and perception normal.        Mood and Affect: Affect is blunt.        Speech: Speech normal.        Behavior: Behavior normal. Behavior is cooperative.        Thought Content: Thought content normal.        Cognition and Memory: Cognition and memory normal.        Judgment: Judgment normal.    Review of Systems  Constitutional:  Negative for chills and fever.  Respiratory:  Negative for shortness of breath.   Cardiovascular:  Negative for chest pain and palpitations.  Gastrointestinal:  Negative for abdominal pain.  Neurological:  Negative for headaches.  Psychiatric/Behavioral:  Positive for depression and substance abuse. The patient is nervous/anxious.   All other systems reviewed and are negative.  Blood pressure 130/83, pulse 64, temperature 98.7 F (37.1 C), resp. rate (!) 23, height 5\' 10"  (1.778 m), weight 67.1 kg, SpO2 98%. Body mass index is 21.24 kg/m.   Treatment Plan Summary: Daily contact with patient to assess and evaluate symptoms and progress in treatment, Medication management, and Plan   Major depressive disorder, recurrent, severe without issues: Prozac 20 mg daily Risperdal 1 mg daily at bedtime  EPS: Cogentin 1 mg daily  Insomnia: Trazodone 100 mg at bedtime PRN sleep   Nanine Means, NP 11/23/2022, 3:36 PM

## 2022-11-23 NOTE — Plan of Care (Signed)
  Problem: Education: Goal: Emotional status will improve Outcome: Progressing Goal: Mental status will improve Outcome: Progressing Goal: Verbalization of understanding the information provided will improve Outcome: Progressing   Problem: Activity: Goal: Interest or engagement in activities will improve Outcome: Progressing   

## 2022-11-23 NOTE — Progress Notes (Signed)
Patient pleasant and cooperative. Medication compliant. Appropriate with staff and peers. Denies SI, HI, AVH. Smiling socializing on unit in no distress. Encouragement and support provided. Safety checks maintained. Meds given as prescribed. Pt receptive and remains safe on unit with q 15 min checks.

## 2022-11-23 NOTE — Plan of Care (Signed)
  Problem: Education: Goal: Knowledge of  General Education information/materials will improve Outcome: Adequate for Discharge Goal: Emotional status will improve Outcome: Adequate for Discharge Goal: Mental status will improve Outcome: Adequate for Discharge Goal: Verbalization of understanding the information provided will improve Outcome: Adequate for Discharge   Problem: Activity: Goal: Interest or engagement in activities will improve Outcome: Adequate for Discharge Goal: Sleeping patterns will improve Outcome: Adequate for Discharge   Problem: Coping: Goal: Ability to verbalize frustrations and anger appropriately will improve Outcome: Adequate for Discharge Goal: Ability to demonstrate self-control will improve Outcome: Adequate for Discharge   Problem: Safety: Goal: Periods of time without injury will increase Outcome: Adequate for Discharge

## 2022-11-23 NOTE — Group Note (Signed)
Date:  11/23/2022 Time:  2:11 PM  Group Topic/Focus:  Healthy Communication:   The focus of this group is to discuss communication, barriers to communication, as well as healthy ways to communicate with others. OUTDOOR RECREATION STRUCTURED ACTIVITY    Participation Level:  Did Not Attend   Zayed Griffie 11/23/2022, 2:11 PM

## 2022-11-23 NOTE — Group Note (Signed)
Date:  11/23/2022 Time:  12:52 AM  Group Topic/Focus:  Wrap-Up Group:   The focus of this group is to help patients review their daily goal of treatment and discuss progress on daily workbooks.    Participation Level:  Minimal  Participation Quality:  Appropriate and Attentive  Affect:  Appropriate  Cognitive:  Alert and Appropriate  Insight: Appropriate  Engagement in Group:  Limited  Modes of Intervention:  Discussion  Additional Comments:     Maglione,Cindia Hustead E 11/23/2022, 12:52 AM

## 2022-11-23 NOTE — Group Note (Signed)
Date:  11/23/2022 Time:  9:06 PM  Group Topic/Focus:  Managing Feelings:   The focus of this group is to identify what feelings patients have difficulty handling and develop a plan to handle them in a healthier way upon discharge. Wrap-Up Group:   The focus of this group is to help patients review their daily goal of treatment and discuss progress on daily workbooks.    Participation Level:  Active  Participation Quality:  Appropriate and Attentive  Affect:  Appropriate  Cognitive:  Alert and Appropriate  Insight: Appropriate and Good  Engagement in Group:  Developing/Improving and Engaged  Modes of Intervention:  Clarification, Discussion, Education, Rapport Building, Socialization, and Support  Additional Comments:     Franklin [name] 11/23/2022, 9:06 PM

## 2022-11-23 NOTE — Progress Notes (Signed)
Assumed care of patient this pm, he presented A&O x 4, affect/ mood animated, pleasant and focused. Pt out in the milieu, sociable, behavior appropriate to situation and was without complaints, except for "toe pain. Pt denied thoughts, plan or intent to harm self or others and made a safety commitment, Pt focused on.been discharged tomorrow and were compliant with taking medications and following treatment plan  Pt educated on plan of care, medication regimen and he acknowledged an understanding and was without further complaints or concerns.

## 2022-11-23 NOTE — Progress Notes (Signed)
  [facility] Adult Case Management Discharge Plan :  Will you be returning to the same living situation after discharge:  No.  Patient going to treatment facility. At discharge, do you have transportation home?: Yes,  CSW to assist with transportation needs Do you have the ability to pay for your medications: Yes,  [employer] / [employer]  Release of information consent forms completed and in the chart;  Patient's signature needed at discharge.  Patient to Follow up at:  Follow-up Information     [facility], [facility] Follow up.   Why: Bed is available Wednesday, 11/24/2022 at 9AM. Contact information: [address] [place] [address] [phone]                 Next level of care provider has access to [facility] Link:no  Safety Planning and Suicide Prevention discussed: Yes,  SPE completed with the patient.      Has patient been referred to the Quitline?: Patient refused referral for treatment  Patient has been referred for addiction treatment: Yes, referral information provided patient admitted to [facility].   Harden Mo, LCSW 11/23/2022, 3:44 PM

## 2022-11-23 NOTE — Group Note (Signed)
Date:  11/23/2022 Time:  10:17 AM  Group Topic/Focus:  Goals Group:   The focus of this group is to help patients establish daily goals to achieve during treatment and discuss how the patient can incorporate goal setting into their daily lives to aide in recovery.    Participation Level:  Did Not Attend   Franklin [name] [facility] 11/23/2022, 10:17 AM
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
## 2022-11-24 DIAGNOSIS — F332 Major depressive disorder, recurrent severe without psychotic features: Secondary | ICD-10-CM | POA: Diagnosis not present

## 2022-11-24 NOTE — Discharge Summary (Signed)
Physician Discharge Summary Note  Patient:  Franklin [name] is an 52 y.o., male MRN:  [id number] DOB:  [birth year] Patient phone:  [phone] (home)  Patient address:   [address] [place] [address],  Total Time spent with patient: 45 minutes  Date of Admission:  11/17/2022 Date of Discharge: 11/24/2022  Reason for Admission:  substance abuse and suicidal ideations  Principal Problem: Major depressive disorder, recurrent severe without psychotic features (HCC) Discharge Diagnoses: Principal Problem:   Major depressive disorder, recurrent severe without psychotic features (HCC)   Past Psychiatric History: depression, anxiety, substance abuse  Past Medical History:  Past Medical History:  Diagnosis Date   Anxiety    COPD (chronic obstructive pulmonary disease) (HCC)    Depression    Substance abuse (HCC)     Past Surgical History:  Procedure Laterality Date   NO PAST SURGERIES     Family History:  Family History  Problem Relation Age of Onset   Hypertension Other    Diabetes Other    Family Psychiatric  History: none Social History:  Social History   Substance and Sexual Activity  Alcohol Use Yes   Alcohol/week: 12.0 standard drinks of alcohol   Types: 12 Cans of beer per week     Social History   Substance and Sexual Activity  Drug Use Yes   Types: "Crack" cocaine, Cocaine   Comment: one gram daily    Social History   Socioeconomic History   Marital status: Single    Spouse name: Not on file   Number of children: Not on file   Years of education: Not on file   Highest education level: Not on file  Occupational History   Occupation: [employer]  Tobacco Use   Smoking status: Some Days    Current packs/day: 0.50    Types: Cigarettes   Smokeless tobacco: Never  Vaping Use   Vaping status: Never Used  Substance and Sexual Activity   Alcohol use: Yes    Alcohol/week: 12.0 standard drinks of alcohol    Types: 12 Cans of beer per week   Drug use:  Yes    Types: "Crack" cocaine, Cocaine    Comment: one gram daily   Sexual activity: Yes  Other Topics Concern   Not on file  Social History Narrative   The patient was born and raised in with that by both his biological parents. His father's past way but his mother still living. He denies any history of any physical or sexual abuse. He says he completed 2 years of college at [employer]. 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 He has never been married but has a 56 year old son who lives with his mother. He says he does get to see his son and has a relationship with him. He is not currently dating or in a relationship.      The patient does have a history of a DUI and had a court date last Friday for a DUI. He denies any other pending charges.            Social Determinants of Health   Financial Resource Strain: Not on file  Food Insecurity: Food Insecurity Present (11/17/2022)   Hunger Vital Sign    Worried About Running Out of Food in the Last Year: Sometimes true    Ran Out of Food in the Last Year: Sometimes true  Transportation Needs: Unmet Transportation  Needs (11/17/2022)   PRAPARE - Administrator, Civil Service (Medical): Yes    Lack of Transportation (Non-Medical): Yes  Physical Activity: Not on file  Stress: Not on file  Social Connections: Unknown (10/10/2022)   [facility] from [facility]   Social Network    Social Network: Not on file    Hospital Course:   52 yo male admitted for polysubstance abuse and suicidal ideations.  Medications and therapy started along with detox protocol.  The client stabilized and his symptoms resolved.  Denies suicidal/homicidal ideations, hallucinations, and substance abuse.  Franklin [name] has met maximum benefit of hospitalizations.  Discharge instructions provided with explanations along with crisis numbers, Rx, and follow up appointment  information.  Musculoskeletal: Strength & Muscle Tone: within normal limits Gait & Station: normal Patient leans: N/A   Psychiatric Specialty Exam: Physical Exam Vitals and nursing note reviewed.  Constitutional:      Appearance: Normal appearance.  HENT:     Head: Normocephalic.     Nose: Nose normal.  Pulmonary:     Effort: Pulmonary effort is normal.  Musculoskeletal:        General: Normal range of motion.     Cervical back: Normal range of motion.  Neurological:     General: No focal deficit present.     Mental Status: He is alert and oriented to person, place, and time.       Review of Systems  Psychiatric/Behavioral:  Positive for substance abuse. The patient is nervous/anxious.   All other systems reviewed and are negative.    Blood pressure 118/73, pulse 65, temperature 98.5 F (36.9 C), resp. rate 19, height 5\' 10"  (1.778 m), weight 67.1 kg, SpO2 95%.Body mass index is 21.24 kg/m.  General Appearance: Casual  Eye Contact:  Good  Speech:  Normal Rate  Volume:  Normal  Mood:  Anxious  Affect:  Congruent  Thought Process:  Coherent  Orientation:  Full (Time, Place, and Person)  Thought Content:  WDL and Logical  Suicidal Thoughts:  No  Homicidal Thoughts:  No  Memory:  Immediate;   Good Recent;   Good Remote;   Good  Judgement:  Fair  Insight:  Fair  Psychomotor Activity:  Normal  Concentration:  Concentration: Good and Attention Span: Good  Recall:  Good  Fund of Knowledge:  Good  Language:  Good  Akathisia:  No  Handed:  Right  AIMS (if indicated):     Assets:  Leisure Time Physical Health Resilience Social Support  ADL's:  Intact  Cognition:  WNL  Sleep:         Physical Exam: Physical Exam Vitals and nursing note reviewed.  Constitutional:      Appearance: Normal appearance.  HENT:     Head: Normocephalic.     Nose: Nose normal.  Pulmonary:     Effort: Pulmonary effort is normal.  Musculoskeletal:        General: Normal range of  motion.     Cervical back: Normal range of motion.  Neurological:     General: No focal deficit present.     Mental Status: He is alert.    Review of Systems  Psychiatric/Behavioral:  Positive for substance abuse. The patient is nervous/anxious.   All other systems reviewed and are negative.  Blood pressure 118/73, pulse 65, temperature 98.5 F (36.9 C), resp. rate 19, height 5\' 10"  (1.778 m), weight 67.1 kg, SpO2 95%. Body mass index is 21.24 kg/m.   Social History  Tobacco Use  Smoking Status Some Days   Current packs/day: 0.50   Types: Cigarettes  Smokeless Tobacco Never   Tobacco Cessation:  A prescription for an FDA-approved tobacco cessation medication was offered at discharge and the patient refused   Blood Alcohol level:  Lab Results  Component Value Date   [facility] <10 11/17/2022   ETH <10 11/13/2022    Metabolic Disorder Labs:  Lab Results  Component Value Date   HGBA1C 5.9 (A) 07/12/2022   MPG 117 02/02/2009   No results found for: "PROLACTIN" Lab Results  Component Value Date   CHOL 203 (H) 10/31/2022   TRIG 196 (H) 10/31/2022   HDL 84 10/31/2022   CHOLHDL 2.4 10/31/2022   VLDL 39 10/31/2022   LDLCALC 80 10/31/2022   LDLCALC 63 10/08/2014    See Psychiatric Specialty Exam and Suicide Risk Assessment completed by Attending Physician prior to discharge.  Discharge destination:  [facility]  Is patient on multiple antipsychotic therapies at discharge:  No   Has Patient had three or more failed trials of antipsychotic monotherapy by history:  No  Recommended Plan for Multiple Antipsychotic Therapies: NA  Discharge Instructions     Diet - low sodium heart healthy   Complete by: As directed    Discharge instructions   Complete by: As directed    Follow up with [facility]   Increase activity slowly   Complete by: As directed       Allergies as of 11/24/2022   No Known Allergies      Medication List     STOP taking these  medications    albuterol 108 (90 Base) MCG/ACT inhaler Commonly known as: VENTOLIN HFA   chlordiazePOXIDE 25 MG capsule Commonly known as: LIBRIUM   Wixela Inhub 250-50 MCG/ACT Aepb Generic drug: fluticasone-salmeterol       TAKE these medications      Indication  benztropine 1 MG tablet Commonly known as: COGENTIN Take 1 tablet (1 mg total) by mouth daily for 10 days.  Indication: Extrapyramidal Reaction caused by Medications   carvedilol 3.125 MG tablet Commonly known as: COREG Take 1 tablet (3.125 mg total) by mouth 2 (two) times daily with a meal.  Indication: High Blood Pressure   FLUoxetine 20 MG capsule Commonly known as: PROZAC Take 1 capsule (20 mg total) by mouth daily for 10 days.  Indication: Abuse or Misuse of Alcohol, Depression   mometasone-formoterol 200-5 MCG/ACT Aero Commonly known as: DULERA Inhale 2 puffs into the lungs 2 (two) times daily for 10 days.  Indication: Chronic Obstructive Lung Disease   risperiDONE 1 MG tablet Commonly known as: RISPERDAL Take 1 tablet (1 mg total) by mouth at bedtime for 10 days. What changed:  medication strength how much to take when to take this  Indication: Major Depressive Disorder   topiramate 50 MG tablet Commonly known as: TOPAMAX Take 1 tablet (50 mg total) by mouth daily for 10 days.  Indication: Abuse or Misuse of Alcohol   traZODone 100 MG tablet Commonly known as: DESYREL Take 1 tablet (100 mg total) by mouth at bedtime as needed for up to 10 days for sleep. What changed:  when to take this reasons to take this  Indication: Trouble Sleeping        Follow-up Information     Services, [facility] Follow up.   Why: Bed is available Wednesday, 11/24/2022 at 9AM. Contact information: [address] [place] [address] [phone]  Follow-up recommendations:  Activity:  as tolerated Diet:  heart healthy diet Major depressive disorder, recurrent, severe  without issues: Prozac 20 mg daily Risperdal 1 mg daily at bedtime   EPS: Cogentin 1 mg daily   Insomnia: Trazodone 100 mg at bedtime PRN sleep  Comments:  follow up with [facility]  Signed: Nanine Means, NP 11/24/2022, 6:45 AM

## 2022-11-24 NOTE — BHH Suicide Risk Assessment (Signed)
[facility] Discharge Suicide Risk Assessment   Principal Problem: Major depressive disorder, recurrent severe without psychotic features [facility]) Discharge Diagnoses: Principal Problem:   Major depressive disorder, recurrent severe without psychotic features (HCC)   Total Time spent with patient: 45 minutes  Musculoskeletal: Strength & Muscle Tone: within normal limits Gait & Station: normal Patient leans: N/A  Psychiatric Specialty Exam: Physical Exam Vitals and nursing note reviewed.  Constitutional:      Appearance: Normal appearance.  HENT:     Head: Normocephalic.     Nose: Nose normal.  Pulmonary:     Effort: Pulmonary effort is normal.  Musculoskeletal:        General: Normal range of motion.     Cervical back: Normal range of motion.  Neurological:     General: No focal deficit present.     Mental Status: He is alert and oriented to person, place, and time.     Review of Systems  Psychiatric/Behavioral:  Positive for substance abuse. The patient is nervous/anxious.   All other systems reviewed and are negative.   Blood pressure 118/73, pulse 65, temperature 98.5 F (36.9 C), resp. rate 19, height 5\' 10"  (1.778 m), weight 67.1 kg, SpO2 95%.Body mass index is 21.24 kg/m.  General Appearance: Casual  Eye Contact:  Good  Speech:  Normal Rate  Volume:  Normal  Mood:  Anxious  Affect:  Congruent  Thought Process:  Coherent  Orientation:  Full (Time, Place, and Person)  Thought Content:  WDL and Logical  Suicidal Thoughts:  No  Homicidal Thoughts:  No  Memory:  Immediate;   Good Recent;   Good Remote;   Good  Judgement:  Fair  Insight:  Fair  Psychomotor Activity:  Normal  Concentration:  Concentration: Good and Attention Span: Good  Recall:  Good  Fund of Knowledge:  Good  Language:  Good  Akathisia:  No  Handed:  Right  AIMS (if indicated):     Assets:  Leisure Time Physical Health Resilience Social Support  ADL's:  Intact  Cognition:  WNL  Sleep:         Physical Exam: Physical Exam Vitals and nursing note reviewed.  Constitutional:      Appearance: Normal appearance.  HENT:     Head: Normocephalic.     Nose: Nose normal.  Pulmonary:     Effort: Pulmonary effort is normal.  Musculoskeletal:        General: Normal range of motion.     Cervical back: Normal range of motion.  Neurological:     General: No focal deficit present.     Mental Status: He is alert and oriented to person, place, and time.    Review of Systems  Psychiatric/Behavioral:  Positive for substance abuse. The patient is nervous/anxious.   All other systems reviewed and are negative.  Blood pressure 118/73, pulse 65, temperature 98.5 F (36.9 C), resp. rate 19, height 5\' 10"  (1.778 m), weight 67.1 kg, SpO2 95%. Body mass index is 21.24 kg/m.  Mental Status Per Nursing Assessment::   On Admission:  Suicidal ideation indicated by patient  Demographic Factors:  Male and Low socioeconomic status  Loss Factors: Financial problems/change in socioeconomic status  Historical Factors: NA  Risk Reduction Factors:   Sense of responsibility to family and Positive social support  Continued Clinical Symptoms:  Anxiety, mild  Cognitive Features That Contribute To Risk:  None    Suicide Risk:  Minimal: No identifiable suicidal ideation.  Patients presenting with no risk  factors but with morbid ruminations; may be classified as minimal risk based on the severity of the depressive symptoms   Follow-up Information     [facility] Follow up.   Why: Bed is available Wednesday, 11/24/2022 at 9AM. Contact information: [address] [place] [address] [phone]                 Plan Of Care/Follow-up recommendations:  Activity:  as tolerated Diet:  heart healthy diet Major depressive disorder, recurrent, severe without issues: Prozac 20 mg daily Risperdal 1 mg daily at bedtime   EPS: Cogentin 1 mg daily    Insomnia: Trazodone 100 mg at bedtime PRN sleep  Nanine Means, NP 11/24/2022, 6:42 AM

## 2022-11-24 NOTE — Progress Notes (Signed)
Patient pleasant and cooperative on approach. Denies SI,HI and AVH. Verbalized understanding discharge instructions,prescriptions and follow up care. 30 days medicines given to patient. All belongings returned from [employer]. Suicide safety plan filled by patient and placed in chart. Copy given to patient.Patient escorted out by staff and transported by safe transport.

## 2022-12-02 ENCOUNTER — Other Ambulatory Visit: Payer: Self-pay

## 2022-12-02 ENCOUNTER — Emergency Department
Admission: EM | Admit: 2022-12-02 | Discharge: 2022-12-03 | Disposition: A | Discharge status: Other Acute Inpatient Hospital | Payer: MEDICAID | Attending: Emergency Medicine | Admitting: Emergency Medicine

## 2022-12-02 DIAGNOSIS — F32A Depression, unspecified: Secondary | ICD-10-CM | POA: Insufficient documentation

## 2022-12-02 DIAGNOSIS — F1914 Other psychoactive substance abuse with psychoactive substance-induced mood disorder: Secondary | ICD-10-CM | POA: Insufficient documentation

## 2022-12-02 DIAGNOSIS — R45851 Suicidal ideations: Secondary | ICD-10-CM | POA: Insufficient documentation

## 2022-12-02 DIAGNOSIS — F1910 Other psychoactive substance abuse, uncomplicated: Secondary | ICD-10-CM

## 2022-12-02 DIAGNOSIS — F1414 Cocaine abuse with cocaine-induced mood disorder: Secondary | ICD-10-CM | POA: Insufficient documentation

## 2022-12-02 DIAGNOSIS — J449 Chronic obstructive pulmonary disease, unspecified: Secondary | ICD-10-CM | POA: Insufficient documentation

## 2022-12-02 DIAGNOSIS — F1010 Alcohol abuse, uncomplicated: Secondary | ICD-10-CM | POA: Diagnosis not present

## 2022-12-02 LAB — COMPREHENSIVE METABOLIC PANEL
ALT: 19 U/L (ref 0–44)
AST: 29 U/L (ref 15–41)
Albumin: 4 g/dL (ref 3.5–5.0)
Alkaline Phosphatase: 85 U/L (ref 38–126)
Anion gap: 10 (ref 5–15)
BUN: 16 mg/dL (ref 6–20)
CO2: 26 mmol/L (ref 22–32)
Calcium: 9 mg/dL (ref 8.9–10.3)
Chloride: 103 mmol/L (ref 98–111)
Creatinine, Ser: 1.11 mg/dL (ref 0.61–1.24)
GFR, Estimated: >60 mL/min (ref >60)
Glucose, Bld: 162 mg/dL — ABNORMAL HIGH (ref 70–99)
Potassium: 3.9 mmol/L (ref 3.5–5.1)
Sodium: 139 mmol/L (ref 135–145)
Total Bilirubin: 0.8 mg/dL (ref <1.2)
Total Protein: 6.8 g/dL (ref 6.5–8.1)

## 2022-12-02 LAB — URINE DRUG SCREEN, QUALITATIVE (ARMC ONLY)
Amphetamines, Ur Screen: NOT DETECTED
Barbiturates, Ur Screen: NOT DETECTED
Benzodiazepine, Ur Scrn: POSITIVE — AB
Cannabinoid 50 Ng, Ur ~~LOC~~: NOT DETECTED
Cocaine Metabolite,Ur ~~LOC~~: POSITIVE — AB
MDMA (Ecstasy)Ur Screen: NOT DETECTED
Methadone Scn, Ur: NOT DETECTED
Opiate, Ur Screen: NOT DETECTED
Phencyclidine (PCP) Ur S: NOT DETECTED
Tricyclic, Ur Screen: NOT DETECTED

## 2022-12-02 LAB — CBC
HCT: 38.5 % — ABNORMAL LOW (ref 39.0–52.0)
Hemoglobin: 12.8 g/dL — ABNORMAL LOW (ref 13.0–17.0)
MCH: 30.4 pg (ref 26.0–34.0)
MCHC: 33.2 g/dL (ref 30.0–36.0)
MCV: 91.4 fL (ref 80.0–100.0)
Platelets: 311 10*3/uL (ref 150–400)
RBC: 4.21 MIL/uL — ABNORMAL LOW (ref 4.22–5.81)
RDW: 14.4 % (ref 11.5–15.5)
WBC: 5.9 10*3/uL (ref 4.0–10.5)
nRBC: 0 % (ref 0.0–0.2)

## 2022-12-02 LAB — SALICYLATE LEVEL: Salicylate Lvl: <7 mg/dL — ABNORMAL LOW (ref 7.0–30.0)

## 2022-12-02 LAB — ACETAMINOPHEN LEVEL: Acetaminophen (Tylenol), Serum: <10 ug/mL — ABNORMAL LOW (ref 10–30)

## 2022-12-02 LAB — ETHANOL: Alcohol, Ethyl (B): <10 mg/dL (ref <10)

## 2022-12-02 MED ORDER — LORAZEPAM 2 MG/ML IJ SOLN: 1.0000 mg | INTRAMUSCULAR | Status: DC | PRN

## 2022-12-02 MED ORDER — LORAZEPAM 1 MG PO TABS
1.0000 mg | ORAL_TABLET | ORAL | Status: DC | PRN
  Administered 2022-12-02: 1 mg via ORAL
  Filled 2022-12-02: qty 1

## 2022-12-02 MED ORDER — THIAMINE HCL 100 MG/ML IJ SOLN: 100.0000 mg | Freq: Every day | INTRAMUSCULAR | Status: DC

## 2022-12-02 MED ORDER — ADULT MULTIVITAMIN W/MINERALS CH
1.0000 | ORAL_TABLET | Freq: Every day | ORAL | Status: DC
  Administered 2022-12-02 – 2022-12-03 (×2): 1 via ORAL
  Filled 2022-12-02 (×2): qty 1

## 2022-12-02 MED ORDER — FOLIC ACID 1 MG PO TABS
1.0000 mg | ORAL_TABLET | Freq: Every day | ORAL | Status: DC
  Administered 2022-12-02 – 2022-12-03 (×2): 1 mg via ORAL
  Filled 2022-12-02 (×2): qty 1

## 2022-12-02 MED ORDER — THIAMINE MONONITRATE 100 MG PO TABS
100.0000 mg | ORAL_TABLET | Freq: Every day | ORAL | Status: DC
  Administered 2022-12-02 – 2022-12-03 (×2): 100 mg via ORAL
  Filled 2022-12-02 (×2): qty 1

## 2022-12-02 NOTE — ED Triage Notes (Signed)
Pt presents to ER reports he is Detoxing from alcohol and crack. Pt also reports SI, denies HI, denies visual or auditory hallucinations. Pt talks in complete sentences no respiratory distress noted.

## 2022-12-02 NOTE — ED Notes (Signed)
Pt reports he drinks 12 pack of beer a day, reports last drink was yesterday.  Pt uses a gram of crack a day and last use was yesterday.

## 2022-12-02 NOTE — ED Notes (Signed)
Pt given meal tray and drink.

## 2022-12-02 NOTE — ED Provider Notes (Signed)
[facility] Provider Note    Event Date/Time   First MD Initiated Contact with Patient 12/02/22 1912     (approximate)   History   Psychiatric Evaluation   HPI  Franklin [name] is a 52 y.o. male   Past medical history of COPD, depression and anxiety, substance use, who presents to the emergency department with multiple complaints.  First of all he has been using crack cocaine and alcohol and wants to detox.  His last used yesterday.  He also feels depressed and suicidal, with a plan to jump off a bridge.  He is here voluntarily seeking help.  He denies any other acute medical complaints.  No HI or hallucinations.  External Medical Documents Reviewed: Charge summary from 11/24/2022 when he was admitted for depression, substance use, si      Physical Exam   Triage Vital Signs: ED Triage Vitals  Encounter Vitals Group     BP 12/02/22 1847 (!) 152/77     Systolic BP Percentile --      Diastolic BP Percentile --      Pulse Rate 12/02/22 1847 74     Resp 12/02/22 1847 16     Temp 12/02/22 1847 98.3 F (36.8 C)     Temp Source 12/02/22 1847 Oral     SpO2 12/02/22 1847 94 %     Weight 12/02/22 1848 145 lb (65.8 kg)     Height 12/02/22 1848 5\' 10"  (1.778 m)     Head Circumference --      Peak Flow --      Pain Score 12/02/22 1848 0     Pain Loc --      Pain Education --      Exclude from Growth Chart --     Most recent vital signs: Vitals:   12/02/22 1847  BP: (!) 152/77  Pulse: 74  Resp: 16  Temp: 98.3 F (36.8 C)  SpO2: 94%    General: Awake, no distress.  CV:  Good peripheral perfusion.  Resp:  Normal effort.  Abd:  No distention.  Other:  Awake alert comfortable slightly hypertensive otherwise vital signs normal.  Pleasant conversant appropriate.  Clear lungs soft nontender abdomen.  No tremors or tongue fasciculation.  No tachycardia.   ED Results / Procedures / Treatments   Labs (all labs ordered are listed, but only abnormal  results are displayed) Labs Reviewed  CBC - Abnormal; Notable for the following components:      Result Value   RBC 4.21 (*)    Hemoglobin 12.8 (*)    HCT 38.5 (*)    All other components within normal limits  COMPREHENSIVE METABOLIC PANEL  ETHANOL  URINE DRUG SCREEN, QUALITATIVE ([facility] ONLY)  ACETAMINOPHEN LEVEL  SALICYLATE LEVEL     I ordered and reviewed the above labs they are notable for white blood cell count is normal  PROCEDURES:  Critical Care performed: No  Procedures   MEDICATIONS ORDERED IN ED: Medications  LORazepam (ATIVAN) tablet 1-4 mg (has no administration in time range)    Or  LORazepam (ATIVAN) injection 1-4 mg (has no administration in time range)  thiamine (VITAMIN B1) tablet 100 mg (has no administration in time range)    Or  thiamine (VITAMIN B1) injection 100 mg (has no administration in time range)  folic acid (FOLVITE) tablet 1 mg (has no administration in time range)  multivitamin with minerals tablet 1 tablet (has no administration in time range)  IMPRESSION / MDM / ASSESSMENT AND PLAN / ED COURSE  I reviewed the triage vital signs and the nursing notes.                                Patient's presentation is most consistent with acute presentation with potential threat to life or bodily function.  Differential diagnosis includes, but is not limited to, substance-induced mood disorder, acute decompensated psychiatric illness, suicidal ideation, considered but less likely acute intoxication or withdrawal at this time   The patient is on the cardiac monitor to evaluate for evidence of arrhythmia and/or significant heart rate changes.  MDM:    Patient here for detox request, told we do not offer detox programs here and he does not appear to be in acute withdrawal at this time.  Put on CIWA.  Also with depression and suicidal ideation will get basic labs toxicologic labs and psychiatric evaluation.  Voluntary.       FINAL CLINICAL  IMPRESSION(S) / ED DIAGNOSES   Final diagnoses:  Depression, unspecified depression type  Suicidal ideation  Alcohol abuse  Polysubstance abuse (HCC)     Rx / DC Orders   ED Discharge Orders     None        Note:  This document was prepared using Dragon voice recognition software and may include unintentional dictation errors.    Pilar Jarvis, MD 12/02/22 [id number]

## 2022-12-02 NOTE — ED Notes (Signed)
1 pair Joggers, one pair of socks, one hoodie, one jacket, long sleeve shirt, and pair of shoes and underwear. All pt's belongings placed in one bag

## 2022-12-03 ENCOUNTER — Other Ambulatory Visit (HOSPITAL_COMMUNITY)
Admission: EM | Admit: 2022-12-03 | Discharge: 2022-12-09 | Disposition: A | Discharge status: Home / Self Care | Payer: MEDICAID | Attending: Addiction Medicine | Admitting: Addiction Medicine

## 2022-12-03 DIAGNOSIS — F1414 Cocaine abuse with cocaine-induced mood disorder: Secondary | ICD-10-CM | POA: Diagnosis present

## 2022-12-03 DIAGNOSIS — F32A Depression, unspecified: Secondary | ICD-10-CM | POA: Diagnosis not present

## 2022-12-03 MED ORDER — HYDROXYZINE HCL 25 MG PO TABS
25.0000 mg | ORAL_TABLET | Freq: Three times a day (TID) | ORAL | Status: DC | PRN
  Filled 2022-12-03: qty 1

## 2022-12-03 MED ORDER — CARVEDILOL 6.25 MG PO TABS
3.1250 mg | ORAL_TABLET | Freq: Two times a day (BID) | ORAL | Status: DC
Start: 2022-12-03 — End: 2022-12-03

## 2022-12-03 MED ORDER — HALOPERIDOL LACTATE 5 MG/ML IJ SOLN: 5.0000 mg | Freq: Four times a day (QID) | INTRAMUSCULAR | Status: DC | PRN

## 2022-12-03 MED ORDER — THIAMINE MONONITRATE 100 MG PO TABS
100.0000 mg | ORAL_TABLET | Freq: Every day | ORAL | Status: DC
  Administered 2022-12-04 – 2022-12-09 (×6): 100 mg via ORAL
  Filled 2022-12-03 (×6): qty 1

## 2022-12-03 MED ORDER — HYDROXYZINE HCL 25 MG PO TABS: 25.0000 mg | ORAL_TABLET | Freq: Four times a day (QID) | ORAL | Status: AC | PRN

## 2022-12-03 MED ORDER — DIPHENHYDRAMINE HCL 50 MG PO CAPS: 50.0000 mg | ORAL_CAPSULE | Freq: Four times a day (QID) | ORAL | Status: DC | PRN

## 2022-12-03 MED ORDER — THIAMINE HCL 100 MG/ML IJ SOLN
100.0000 mg | Freq: Once | INTRAMUSCULAR | Status: AC
  Administered 2022-12-03: 100 mg via INTRAMUSCULAR
  Filled 2022-12-03: qty 2

## 2022-12-03 MED ORDER — LORAZEPAM 1 MG PO TABS
1.0000 mg | ORAL_TABLET | Freq: Three times a day (TID) | ORAL | Status: AC
Start: 2022-12-05 — End: 2022-12-06
  Administered 2022-12-05 (×3): 1 mg via ORAL
  Filled 2022-12-03 (×3): qty 1

## 2022-12-03 MED ORDER — LORAZEPAM 1 MG PO TABS: 2.0000 mg | ORAL_TABLET | Freq: Four times a day (QID) | ORAL | Status: DC | PRN

## 2022-12-03 MED ORDER — ACETAMINOPHEN 325 MG PO TABS
650.0000 mg | ORAL_TABLET | Freq: Four times a day (QID) | ORAL | Status: DC | PRN
  Administered 2022-12-03: 650 mg via ORAL
  Filled 2022-12-03: qty 2

## 2022-12-03 MED ORDER — LORAZEPAM 1 MG PO TABS
1.0000 mg | ORAL_TABLET | Freq: Every day | ORAL | Status: AC
  Administered 2022-12-07: 1 mg via ORAL
  Filled 2022-12-03: qty 1

## 2022-12-03 MED ORDER — LORAZEPAM 1 MG PO TABS
1.0000 mg | ORAL_TABLET | Freq: Four times a day (QID) | ORAL | Status: AC
  Administered 2022-12-03 – 2022-12-04 (×5): 1 mg via ORAL
  Filled 2022-12-03 (×6): qty 1

## 2022-12-03 MED ORDER — TRAZODONE HCL 50 MG PO TABS
50.0000 mg | ORAL_TABLET | Freq: Every evening | ORAL | Status: DC | PRN
  Administered 2022-12-04 – 2022-12-08 (×4): 50 mg via ORAL
  Filled 2022-12-03 (×4): qty 1
  Filled 2022-12-03: qty 14
  Filled 2022-12-03: qty 1

## 2022-12-03 MED ORDER — LORAZEPAM 1 MG PO TABS
1.0000 mg | ORAL_TABLET | Freq: Two times a day (BID) | ORAL | Status: AC
  Administered 2022-12-06 (×2): 1 mg via ORAL
  Filled 2022-12-03 (×3): qty 1

## 2022-12-03 MED ORDER — FLUOXETINE HCL 20 MG PO CAPS: 20.0000 mg | ORAL_CAPSULE | Freq: Every day | ORAL | Status: DC

## 2022-12-03 MED ORDER — LORAZEPAM 2 MG/ML IJ SOLN: 2.0000 mg | Freq: Four times a day (QID) | INTRAMUSCULAR | Status: DC | PRN

## 2022-12-03 MED ORDER — MAGNESIUM HYDROXIDE 400 MG/5ML PO SUSP: 30.0000 mL | Freq: Every day | ORAL | Status: DC | PRN

## 2022-12-03 MED ORDER — HALOPERIDOL 5 MG PO TABS: 5.0000 mg | ORAL_TABLET | Freq: Four times a day (QID) | ORAL | Status: DC | PRN

## 2022-12-03 MED ORDER — LORAZEPAM 1 MG PO TABS: 1.0000 mg | ORAL_TABLET | Freq: Four times a day (QID) | ORAL | Status: DC | PRN

## 2022-12-03 MED ORDER — DIPHENHYDRAMINE HCL 50 MG/ML IJ SOLN: 50.0000 mg | Freq: Four times a day (QID) | INTRAMUSCULAR | Status: DC | PRN

## 2022-12-03 MED ORDER — ADULT MULTIVITAMIN W/MINERALS CH
1.0000 | ORAL_TABLET | Freq: Every day | ORAL | Status: DC
  Administered 2022-12-04 – 2022-12-09 (×6): 1 via ORAL
  Filled 2022-12-03 (×6): qty 1

## 2022-12-03 MED ORDER — ALUM & MAG HYDROXIDE-SIMETH 200-200-20 MG/5ML PO SUSP: 30.0000 mL | ORAL | Status: DC | PRN

## 2022-12-03 MED ORDER — ONDANSETRON 4 MG PO TBDP: 4.0000 mg | ORAL_TABLET | Freq: Four times a day (QID) | ORAL | Status: AC | PRN

## 2022-12-03 MED ORDER — LOPERAMIDE HCL 2 MG PO CAPS: 2.0000 mg | ORAL_CAPSULE | ORAL | Status: AC | PRN

## 2022-12-03 NOTE — ED Notes (Signed)
Voluntary consent and rider waiver placed in patient chart

## 2022-12-03 NOTE — ED Notes (Signed)
EMTALA 

## 2022-12-03 NOTE — TOC Initial Note (Signed)
Transition of Care [facility]) - Initial/Assessment Note    Patient Details  Name: Franklin [name] MRN: [id number] Date of Birth: 08-Nov-1970  Transition of Care [facility]) CM/SW Contact:    Franklin Palms, LCSW Phone Number: 12/03/2022, 10:48 AM  Clinical Narrative:                  CSW spoke with patient bedside. Patient reported he is currently using drugs and wants to go to [facility] (drug rehabilitation facility) in [place]. Patient reports his sister is suppose to be taking him. Patient questioned CSW about possible transportation to the facility. CSW questioned ED about possible transportation which is not possible. CSW explained that it was not possible to take patient. No other needs at this time.       Patient Goals and CMS Choice            Expected Discharge Plan and Services                                              Prior Living Arrangements/Services                       Activities of Daily Living      Permission Sought/Granted                  Emotional Assessment              Admission diagnosis:  Si;Detox Patient Active Problem List   Diagnosis Date Noted   Cocaine abuse with cocaine-induced mood disorder (HCC) 12/03/2022   Major depressive disorder, 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   PCP:  Patient, No Pcp Per Pharmacy:   [facility] [address] [place] [address] Phone: [phone] Fax: [phone]  [facility] - [place], [place] [address] [place] [address] Phone: [phone] Fax: [phone]     Social Determinants of Health (SDOH) Social History: SDOH Screenings   Food Insecurity: Food Insecurity Present (11/17/2022)  Housing: Patient Declined (11/17/2022)  Transportation Needs: Unmet Transportation Needs (11/17/2022)  Utilities: Not At Risk (11/17/2022)  Alcohol Screen: Medium Risk (11/17/2022)  Social Connections: Unknown (10/10/2022)   Received from [facility]  Tobacco Use: High Risk (11/17/2022)   SDOH Interventions:     Readmission Risk Interventions     No data to display

## 2022-12-03 NOTE — ED Notes (Signed)
Patient admitted from [facility] for detox from cocaine an ETOH.  Patient calm and cooperative with admission process.  No complaint or signs of etoh withdrawal at this time.  Patient denies h/o seizures or DT's.  Patient oriented to unit and room and is now in dayroom getting ready to eat dinner.  Patient given better fitting scrubs.  Patient also advides to make nursing aware is s/s of etoh withdrawal occur.  Skin check done and all WNL.  Will monitor and provide safety while on unit.

## 2022-12-03 NOTE — ED Notes (Signed)
Belongings bag 1/1 sent with patient at time of discharge

## 2022-12-03 NOTE — BH Assessment (Signed)
Comprehensive Clinical Assessment (CCA) Screening, Triage and Referral Note  12/03/2022 Franklin [name] [id number]  Chief Complaint:  Chief Complaint  Patient presents with   Psychiatric Evaluation   Visit Diagnosis: Substance Induced Mood Disorder  Franklin [name] is a 52 year old male who presents to the ER with passive SI. Patient reports of having history of relapse and uses substances when he overwhelmed. Patient was recently inpatient with [facility], upon discharged he did not follow up with the treatment facility. During the interview, the patient was calm, cooperative and pleasant. Throughout the interview, he denies SI/HI and AV/H.  Patient Reported Information How did you hear about [place]? Self  What Is the Reason for Your Visit/Call Today? Patient to the ER due to voicing SI, and substance use.  How Long Has This Been Causing You Problems? 1 wk - 1 month  What Do You Feel Would Help You the Most Today? Alcohol or Drug Use Treatment; Treatment for Depression or other mood problem   Have You Recently Had Any Thoughts About Hurting Yourself? No  Are You Planning to Commit Suicide/Harm Yourself At This time? No   Have you Recently Had Thoughts About Hurting Someone Franklin [name]? No  Are You Planning to Harm Someone at This Time? No  Explanation: N/A   Have You Used Any Alcohol or Drugs in the Past 24 Hours? Yes  How Long Ago Did You Use Drugs or Alcohol? No data recorded What Did You Use and How Much? Cocaine   Do You Currently Have a Therapist/Psychiatrist? No  Name of Therapist/Psychiatrist: N/A   Have You Been Recently Discharged From Any Office Practice or Programs? No  Explanation of Discharge From Practice/Program: [facility] on 05/11/2022    CCA Screening Triage Referral Assessment Type of Contact: Face-to-Face  Telemedicine Service Delivery:   Is this Initial or Reassessment?   Date Telepsych consult ordered in [facility]:    Time Telepsych consult ordered in [facility]:     Location of Assessment: [facility] ED  Provider Location: [facility] ED   Collateral Involvement: None  Does Patient Have a Court Appointed Legal Guardian? No data recorded Name and Contact of Legal Guardian: No data recorded If Minor and Not Living with Parent(s), Who has Custody? N/A  Is CPS involved or ever been involved? Never  Is APS involved or ever been involved? Never  Patient Determined To Be At Risk for Harm To Self or Others Based on Review of Patient Reported Information or Presenting Complaint? No  Method: No Plan (denies HI)  Availability of Means: No access or NA (denies HI)  Intent: Vague intent or NA (denies HI)  Notification Required: No need or identified person (denies HI)  Additional Information for Danger to Others Potential: Previous attempts (In 2012 or 2013)  Additional Comments for Danger to Others Potential: N/A  Are There Guns or Other Weapons in Your Home? No  Types of Guns/Weapons: N/A  Are These Weapons Safely Secured?                            No  Who Could Verify You Are Able To Have These Secured: N/A  Do You Have any Outstanding Charges, Pending Court Dates, Parole/Probation? Patient denies  Contacted To Inform of Risk of Harm To Self or Others: -- (N/A)  Does Patient Present under Involuntary Commitment? No  [place] of Residence: [name]  Patient Currently Receiving the Following Services: Not Receiving Services  Determination of Need: Emergent (2  hours)  Options For Referral: ED Visit  Discharge Disposition:    [name] MS, LCAS, [facility], [facility] Therapeutic Triage Specialist 12/03/2022 10:58 AM

## 2022-12-03 NOTE — ED Notes (Signed)
Called [facility] for transport patient to [address] [place], [place]  [facility]

## 2022-12-03 NOTE — ED Notes (Signed)
Per Franklin [name] patient accepted to [facility]  [address] [place], [place]     Dr. Standley Brooking accepting

## 2022-12-03 NOTE — ED Notes (Signed)
Progress note   D: Pt seen in his room. Pt denies SI, HI, AVH. Pt rates pain  8/10 as chronic pain in his feet. Pt rates anxiety  8/10 and depression  8/10. Denies withdrawal symptoms from cocaine at this time. No other concerns noted at this time.  A: Pt provided support and encouragement. Pt given scheduled medication as prescribed. PRNs as appropriate. Q15 min checks for safety.   R: Pt safe on the unit. Will continue to monitor.

## 2022-12-03 NOTE — ED Notes (Signed)
VOL/pending psych consult 

## 2022-12-03 NOTE — ED Notes (Signed)
Pt was provided dinner.

## 2022-12-03 NOTE — ED Notes (Signed)
Pt resting at this hour. No apparent distress. RR even and unlabored. Monitored for safety.  

## 2022-12-03 NOTE — ED Provider Notes (Signed)
Emergency Medicine Observation Re-evaluation Note  Franklin [name] is a 52 y.o. male, seen on rounds today.  Pt initially presented to the ED for complaints of Psychiatric Evaluation Currently, the patient is resting, voices no medical complaints.  Physical Exam  BP (!) 152/77   Pulse 80   Temp 98.3 F (36.8 C) (Oral)   Resp 16   Ht 5\' 10"  (1.778 m)   Wt 65.8 kg   SpO2 94%   BMI 20.81 kg/m  Physical Exam General: Resting in no acute distress Cardiac: No cyanosis Lungs: Equal rise and fall Psych: Not agitated  ED Course / MDM  EKG:   I have reviewed the labs performed to date as well as medications administered while in observation.  Recent changes in the last 24 hours include no events overnight.  Plan  Current plan is for psychiatric disposition.    Irean Hong, MD 12/03/22 [phone]

## 2022-12-03 NOTE — ED Provider Notes (Signed)
Facility Based Crisis Admission H&P  Date: 12/03/22 Patient Name: Franklin [name] MRN: [id number] Chief Complaint: Cocaine abuse with cocaine induced mood disorder  Diagnoses:  Final diagnoses:  None    HPI:   Franklin [name] is a 52 year old male with a past psychiatric history significant for alcohol use disorder and cocaine abuse with cocaine induced mood disorder who presents to [facility] from [facility] with a chief complaint of cocaine abuse with cocaine induced mood disorder.  Patient presents today stating that he was abusing crack and alcohol.  He reports that he last used yesterday.  Patient reports that he used a gram a day of cocaine and drank a 12 pack of beer per day.  Patient endorses the following withdrawal symptoms: anxiety, jitteriness, and pain.  Patient endorses depression and rates his depression an 8 out of 10.  Patient endorses depressive episodes every day.  Patient endorses the following depressive symptoms: feelings of sadness, lack of motivation, decreased concentration, irritability, decreased energy, feelings of guilt/worthlessness, and hopelessness.  Patient denies any exacerbating factors to his depression.  Patient denies any alleviating factors to his depression.  Patient also endorses anxiety and rates his anxiety an 8 out of 10.  Patient denies any new stressors at this time.  Patient reports that he has a past psychiatric history significant for depression.  Patient reports that he has been on Prozac and Risperdal.  He reports that these medications were helpful and last used these medications last week.  Patient endorses he has a past history of hospitalization due to mental health.  He reports that he was hospitalized 2 weeks ago for suicidal ideations and worsening depressive symptoms.  Patient denies a past history of suicide attempt nor does he endorse a history of self-harm.  Patient is alert and  oriented x 4, calm, cooperative, and fully engaged in conversation during the encounter.  Patient maintains fair eye contact.  Patient's speech is clear, coherent, and with normal rate.  Patient's thought content is logical.  Patient's thought process is coherent, linear, and goal directed.  Patient endorses depression and anxiety with congruent affect.  Patient denies suicidal or homicidal ideation.  He further denies auditory or visual hallucinations and does not appear to be responding to internal/external stimuli.  Patient endorses fair sleep and receives on average 8 hours of sleep per night.  Patient endorses decreased appetite and eats on average 1 meal per day.  Patient has a past history of alcohol consumption.  Patient denies tobacco use.  Patient reports that he last used illicit substances yesterday.  PHQ 2-9:   Flowsheet Row ED from 12/03/2022 in [facility] ED from 12/02/2022 in [facility] Emergency Department at [facility] Admission (Discharged) from 11/17/2022 in [facility] INPATIENT BEHAVIORAL MEDICINE  C-SSRS RISK CATEGORY No Risk Error: Q3, 4, or 5 should not be populated when Q2 is No Moderate Risk        Total Time spent with patient: 15 minutes  Musculoskeletal  Strength & Muscle Tone: within normal limits Gait & Station: normal Patient leans: N/A  Psychiatric Specialty Exam  Presentation General Appearance:  Appropriate for Environment; Casual  Eye Contact: Fair  Speech: Clear and Coherent; Normal Rate  Speech Volume: Normal  Handedness: Right   Mood and Affect  Mood: Depressed; Anxious  Affect: Congruent; Blunt   Thought Process  Thought Processes: Coherent; Goal Directed; Linear  Descriptions of Associations:Intact  Orientation:Full (Time, Place and Person)  Thought Content:Logical  Diagnosis of Schizophrenia or Schizoaffective disorder in past: No  Duration of Psychotic Symptoms: Greater than six  months  Hallucinations:Hallucinations: None  Ideas of Reference:None  Suicidal Thoughts:Suicidal Thoughts: No  Homicidal Thoughts:Homicidal Thoughts: No   Sensorium  Memory: Immediate Good; Recent Good  Judgment: Fair  Insight: Good   Executive Functions  Concentration: Good  Attention Span: Good  Recall: Good  Fund of Knowledge: Good  Language: Good   Psychomotor Activity  Psychomotor Activity: Psychomotor Activity: Normal Extrapyramidal Side Effects (EPS): Other (comment) (Not noted) AIMS Completed?: No   Assets  Assets: Communication Skills; Desire for Improvement; Financial Resources/Insurance; Resilience   Sleep  Sleep: Sleep: Fair Number of Hours of Sleep: 8   Nutritional Assessment (For OBS and FBC admissions only) Has the patient had a weight loss or gain of 10 pounds or more in the last 3 months?: Yes Has the patient had a decrease in food intake/or appetite?: No Does the patient have dental problems?: No Does the patient have eating habits or behaviors that may be indicators of an eating disorder including binging or inducing vomiting?: No Has the patient recently lost weight without trying?: 1 Has the patient been eating poorly because of a decreased appetite?: 0 Malnutrition Screening Tool Score: 1    Physical Exam Constitutional:      Appearance: Normal appearance.  HENT:     Head: Normocephalic and atraumatic.     Nose: Nose normal.  Eyes:     Extraocular Movements: Extraocular movements intact.  Cardiovascular:     Rate and Rhythm: Normal rate and regular rhythm.  Pulmonary:     Effort: Pulmonary effort is normal.  Abdominal:     General: Abdomen is flat.  Musculoskeletal:     Cervical back: Normal range of motion.  Skin:    General: Skin is warm and dry.  Neurological:     General: No focal deficit present.     Mental Status: He is alert and oriented to person, place, and time.  Psychiatric:        Attention and  Perception: Attention and perception normal. He does not perceive auditory or visual hallucinations.        Mood and Affect: Mood is anxious and depressed. Affect is blunt.        Speech: Speech normal.        Behavior: Behavior normal. Behavior is cooperative.        Thought Content: Thought content normal. Thought content is not paranoid or delusional. Thought content does not include homicidal or suicidal ideation. Thought content does not include suicidal plan.        Cognition and Memory: Cognition and memory normal.        Judgment: Judgment normal.    Review of Systems  Constitutional: Negative.   HENT: Negative.    Eyes: Negative.   Respiratory: Negative.    Cardiovascular: Negative.   Gastrointestinal: Negative.   Skin: Negative.   Psychiatric/Behavioral:  Positive for depression and substance abuse. Negative for hallucinations and suicidal ideas. The patient is nervous/anxious. The patient does not have insomnia.     Blood pressure 139/73, pulse 78, temperature 99 F (37.2 C), temperature source Oral, resp. rate 18, SpO2 97%. There is no height or weight on file to calculate BMI.  Past Psychiatric History:  Alcohol use disorder Cocaine use disorder Substance abuse disorder Major depressive disorder, recurrent severe, without psychotic features Generalized anxiety disorder  Is the patient at risk to self? Yes  Has the patient been a risk to self in the past 6 months? No .    Has the patient been a risk to self within the distant past? No   Is the patient a risk to others? No   Has the patient been a risk to others in the past 6 months? No   Has the patient been a risk to others within the distant past? No   Past Medical History:  Chronic obstructive pulmonary disease Premature ventricular contraction Solitary pulmonary nodule Prediabetes  Family History:  Patient has a family history of hypertension and diabetes  Social History:  No pertinent social  history  Last Labs:  Admission on 12/02/2022, Discharged on 12/03/2022  Component Date Value Ref Range Status   Sodium 12/02/2022 139  135 - 145 mmol/L Final   Potassium 12/02/2022 3.9  3.5 - 5.1 mmol/L Final   Chloride 12/02/2022 103  98 - 111 mmol/L Final   CO2 12/02/2022 26  22 - 32 mmol/L Final   Glucose, Bld 12/02/2022 162 (H)  70 - 99 mg/dL Final   Glucose reference range applies only to samples taken after fasting for at least 8 hours.   BUN 12/02/2022 16  6 - 20 mg/dL Final   Creatinine, Ser 12/02/2022 1.11  0.61 - 1.24 mg/dL Final   Calcium 16/10/9602 9.0  8.9 - 10.3 mg/dL Final   Total Protein 54/09/8117 6.8  6.5 - 8.1 g/dL Final   Albumin 14/78/2956 4.0  3.5 - 5.0 g/dL Final   AST 21/30/8657 29  15 - 41 U/L Final   ALT 12/02/2022 19  0 - 44 U/L Final   Alkaline Phosphatase 12/02/2022 85  38 - 126 U/L Final   Total Bilirubin 12/02/2022 0.8  <1.2 mg/dL Final   GFR, Estimated 12/02/2022 >60  >60 mL/min Final   Comment: (NOTE) Calculated using the CKD-EPI Creatinine Equation (2021)    Anion gap 12/02/2022 10  5 - 15 Final   Performed at [facility], [address] [place], [place] [address]   WBC 12/02/2022 5.9  4.0 - 10.5 K/uL Final   RBC 12/02/2022 4.21 (L)  4.22 - 5.81 MIL/uL Final   Hemoglobin 12/02/2022 12.8 (L)  13.0 - 17.0 g/dL Final   HCT 29/52/8413 38.5 (L)  39.0 - 52.0 % Final   MCV 12/02/2022 91.4  80.0 - 100.0 fL Final   MCH 12/02/2022 30.4  26.0 - 34.0 pg Final   MCHC 12/02/2022 33.2  30.0 - 36.0 g/dL Final   RDW 24/40/1027 14.4  11.5 - 15.5 % Final   Platelets 12/02/2022 311  150 - 400 K/uL Final   nRBC 12/02/2022 0.0  0.0 - 0.2 % Final   Performed at [facility], [address] [place], [place] [address]   Tricyclic, Ur Screen 12/02/2022 NONE DETECTED  NONE DETECTED Final   Amphetamines, Ur Screen 12/02/2022 NONE DETECTED  NONE DETECTED Final   MDMA (Ecstasy)Ur Screen 12/02/2022 NONE DETECTED  NONE DETECTED Final   Cocaine  Metabolite,Ur [place] 12/02/2022 POSITIVE (A)  NONE DETECTED Final   Opiate, Ur Screen 12/02/2022 NONE DETECTED  NONE DETECTED Final   Phencyclidine (PCP) Ur S 12/02/2022 NONE DETECTED  NONE DETECTED Final   Cannabinoid 50 Ng, Ur [place] 12/02/2022 NONE DETECTED  NONE DETECTED Final   Barbiturates, Ur Screen 12/02/2022 NONE DETECTED  NONE DETECTED Final   Benzodiazepine, Ur Scrn 12/02/2022 POSITIVE (A)  NONE DETECTED Final   Methadone Scn, Ur 12/02/2022 NONE DETECTED  NONE DETECTED  Final   Comment: (NOTE) Tricyclics + metabolites, urine    Cutoff 1000 ng/mL Amphetamines + metabolites, urine  Cutoff 1000 ng/mL MDMA (Ecstasy), urine              Cutoff 500 ng/mL Cocaine Metabolite, urine          Cutoff 300 ng/mL Opiate + metabolites, urine        Cutoff 300 ng/mL Phencyclidine (PCP), urine         Cutoff 25 ng/mL Cannabinoid, urine                 Cutoff 50 ng/mL Barbiturates + metabolites, urine  Cutoff 200 ng/mL Benzodiazepine, urine              Cutoff 200 ng/mL Methadone, urine                   Cutoff 300 ng/mL  The urine drug screen provides only a preliminary, unconfirmed analytical test result and should not be used for non-medical purposes. Clinical consideration and professional judgment should be applied to any positive drug screen result due to possible interfering substances. A more specific alternate chemical method must be used in order to obtain a confirmed analytical result. Gas chromatography / mass spectrometry (GC/MS) is the preferred confirm                          atory method. Performed at [facility], [address] [place], [place] [address]    Acetaminophen (Tylenol), Serum 12/02/2022 <10 (L)  10 - 30 ug/mL Final   Comment: (NOTE) Therapeutic concentrations vary significantly. A range of 10-30 ug/mL  may be an effective concentration for many patients. However, some  are best treated at concentrations outside of this range. Acetaminophen concentrations  >150 ug/mL at 4 hours after ingestion  and >50 ug/mL at 12 hours after ingestion are often associated with  toxic reactions.  Performed at [facility], [address] [place], [place] [address]    Alcohol, Ethyl (B) 12/02/2022 <10  <10 mg/dL Final   Comment: (NOTE) Lowest detectable limit for serum alcohol is 10 mg/dL.  For medical purposes only. Performed at [facility], [address] [place], [place] [address]    Salicylate Lvl 12/02/2022 <7.0 (L)  7.0 - 30.0 mg/dL Final   Performed at [facility], [address] [place], [place] [address]  Admission on 11/17/2022, Discharged on 11/17/2022  Component Date Value Ref Range Status   Sodium 11/17/2022 139  135 - 145 mmol/L Final   Potassium 11/17/2022 4.1  3.5 - 5.1 mmol/L Final   Chloride 11/17/2022 100  98 - 111 mmol/L Final   CO2 11/17/2022 29  22 - 32 mmol/L Final   Glucose, Bld 11/17/2022 90  70 - 99 mg/dL Final   Glucose reference range applies only to samples taken after fasting for at least 8 hours.   BUN 11/17/2022 10  6 - 20 mg/dL Final   Creatinine, Ser 11/17/2022 0.68  0.61 - 1.24 mg/dL Final   Calcium 69/62/9528 9.0  8.9 - 10.3 mg/dL Final   Total Protein 41/32/4401 8.1  6.5 - 8.1 g/dL Final   Albumin 02/72/5366 4.6  3.5 - 5.0 g/dL Final   AST 44/03/4740 22  15 - 41 U/L Final   ALT 11/17/2022 20  0 - 44 U/L Final   Alkaline Phosphatase 11/17/2022 61  38 - 126 U/L Final   Total  Bilirubin 11/17/2022 1.1  <1.2 mg/dL Final   GFR, Estimated 11/17/2022 >60  >60 mL/min Final   Comment: (NOTE) Calculated using the CKD-EPI Creatinine Equation (2021)    Anion gap 11/17/2022 10  5 - 15 Final   Performed at [facility], [address] [place], [place] [address]   Alcohol, Ethyl (B) 11/17/2022 <10  <10 mg/dL Final   Comment: (NOTE) Lowest detectable limit for serum alcohol is 10 mg/dL.  For medical purposes only. Performed at [facility], [address]  [place], [place] [address]    Salicylate Lvl 11/17/2022 <7.0 (L)  7.0 - 30.0 mg/dL Final   Performed at [facility], [address] [place], [place] [address]   Acetaminophen (Tylenol), Serum 11/17/2022 <10 (L)  10 - 30 ug/mL Final   Comment: (NOTE) Therapeutic concentrations vary significantly. A range of 10-30 ug/mL  may be an effective concentration for many patients. However, some  are best treated at concentrations outside of this range. Acetaminophen concentrations >150 ug/mL at 4 hours after ingestion  and >50 ug/mL at 12 hours after ingestion are often associated with  toxic reactions.  Performed at [facility], [address] [place], [place] [address]    WBC 11/17/2022 7.6  4.0 - 10.5 K/uL Final   RBC 11/17/2022 5.07  4.22 - 5.81 MIL/uL Final   Hemoglobin 11/17/2022 15.2  13.0 - 17.0 g/dL Final   HCT 65/78/4696 46.8  39.0 - 52.0 % Final   MCV 11/17/2022 92.3  80.0 - 100.0 fL Final   MCH 11/17/2022 30.0  26.0 - 34.0 pg Final   MCHC 11/17/2022 32.5  30.0 - 36.0 g/dL Final   RDW 29/52/8413 14.6  11.5 - 15.5 % Final   Platelets 11/17/2022 345  150 - 400 K/uL Final   nRBC 11/17/2022 0.0  0.0 - 0.2 % Final   Performed at [facility], [address] [place], [place] [address]   Tricyclic, Ur Screen 11/17/2022 NONE DETECTED  NONE DETECTED Final   Amphetamines, Ur Screen 11/17/2022 NONE DETECTED  NONE DETECTED Final   MDMA (Ecstasy)Ur Screen 11/17/2022 NONE DETECTED  NONE DETECTED Final   Cocaine Metabolite,Ur [place] 11/17/2022 POSITIVE (A)  NONE DETECTED Final   Opiate, Ur Screen 11/17/2022 NONE DETECTED  NONE DETECTED Final   Phencyclidine (PCP) Ur S 11/17/2022 NONE DETECTED  NONE DETECTED Final   Cannabinoid 50 Ng, Ur [place] 11/17/2022 POSITIVE (A)  NONE DETECTED Final   Barbiturates, Ur Screen 11/17/2022 NONE DETECTED  NONE DETECTED Final   Benzodiazepine, Ur Scrn 11/17/2022 POSITIVE (A)  NONE DETECTED Final   Methadone Scn, Ur 11/17/2022 NONE DETECTED   NONE DETECTED Final   Comment: (NOTE) Tricyclics + metabolites, urine    Cutoff 1000 ng/mL Amphetamines + metabolites, urine  Cutoff 1000 ng/mL MDMA (Ecstasy), urine              Cutoff 500 ng/mL Cocaine Metabolite, urine          Cutoff 300 ng/mL Opiate + metabolites, urine        Cutoff 300 ng/mL Phencyclidine (PCP), urine         Cutoff 25 ng/mL Cannabinoid, urine                 Cutoff 50 ng/mL Barbiturates + metabolites, urine  Cutoff 200 ng/mL Benzodiazepine, urine              Cutoff 200 ng/mL Methadone, urine  Cutoff 300 ng/mL  The urine drug screen provides only a preliminary, unconfirmed analytical test result and should not be used for non-medical purposes. Clinical consideration and professional judgment should be applied to any positive drug screen result due to possible interfering substances. A more specific alternate chemical method must be used in order to obtain a confirmed analytical result. Gas chromatography / mass spectrometry (GC/MS) is the preferred confirm                          atory method. Performed at [facility], [address] [place], [place] [address]   Admission on 11/13/2022, Discharged on 11/13/2022  Component Date Value Ref Range Status   Sodium 11/13/2022 141  135 - 145 mmol/L Final   Potassium 11/13/2022 4.0  3.5 - 5.1 mmol/L Final   Chloride 11/13/2022 108  98 - 111 mmol/L Final   CO2 11/13/2022 26  22 - 32 mmol/L Final   Glucose, Bld 11/13/2022 126 (H)  70 - 99 mg/dL Final   Glucose reference range applies only to samples taken after fasting for at least 8 hours.   BUN 11/13/2022 14  6 - 20 mg/dL Final   Creatinine, Ser 11/13/2022 0.91  0.61 - 1.24 mg/dL Final   Calcium 46/96/2952 9.1  8.9 - 10.3 mg/dL Final   Total Protein 84/13/2440 7.2  6.5 - 8.1 g/dL Final   Albumin 10/31/2534 4.1  3.5 - 5.0 g/dL Final   AST 64/40/3474 30  15 - 41 U/L Final   ALT 11/13/2022 23  0 - 44 U/L Final   Alkaline Phosphatase  11/13/2022 76  38 - 126 U/L Final   Total Bilirubin 11/13/2022 1.9 (H)  <1.2 mg/dL Final   GFR, Estimated 11/13/2022 >60  >60 mL/min Final   Comment: (NOTE) Calculated using the CKD-EPI Creatinine Equation (2021)    Anion gap 11/13/2022 7  5 - 15 Final   Performed at [facility], [address] [place], [place] [address]   Alcohol, Ethyl (B) 11/13/2022 <10  <10 mg/dL Final   Comment: (NOTE) Lowest detectable limit for serum alcohol is 10 mg/dL.  For medical purposes only. Performed at [facility], [address], [place], [place] [address]    Salicylate Lvl 11/13/2022 <7.0 (L)  7.0 - 30.0 mg/dL Final   Performed at [facility], [address] [place], [place] [address]   Acetaminophen (Tylenol), Serum 11/13/2022 <10 (L)  10 - 30 ug/mL Final   Comment: (NOTE) Therapeutic concentrations vary significantly. A range of 10-30 ug/mL  may be an effective concentration for many patients. However, some  are best treated at concentrations outside of this range. Acetaminophen concentrations >150 ug/mL at 4 hours after ingestion  and >50 ug/mL at 12 hours after ingestion are often associated with  toxic reactions.  Performed at [facility], [address] [place], [place] [address]    WBC 11/13/2022 7.6  4.0 - 10.5 K/uL Final   RBC 11/13/2022 4.70  4.22 - 5.81 MIL/uL Final   Hemoglobin 11/13/2022 14.2  13.0 - 17.0 g/dL Final   HCT 16/60/6301 43.6  39.0 - 52.0 % Final   MCV 11/13/2022 92.8  80.0 - 100.0 fL Final   MCH 11/13/2022 30.2  26.0 - 34.0 pg Final   MCHC 11/13/2022 32.6  30.0 - 36.0 g/dL Final   RDW 60/10/9321 15.0  11.5 - 15.5 % Final   Platelets 11/13/2022  308  150 - 400 K/uL Final   nRBC 11/13/2022 0.0  0.0 - 0.2 % Final   Performed at [facility], [address] [place], [place] [address]   Opiates 11/13/2022 NONE DETECTED  NONE DETECTED Final   Cocaine 11/13/2022 POSITIVE  (A)  NONE DETECTED Final   Benzodiazepines 11/13/2022 POSITIVE (A)  NONE DETECTED Final   Amphetamines 11/13/2022 NONE DETECTED  NONE DETECTED Final   Tetrahydrocannabinol 11/13/2022 POSITIVE (A)  NONE DETECTED Final   Barbiturates 11/13/2022 NONE DETECTED  NONE DETECTED Final   Comment: (NOTE) DRUG SCREEN FOR MEDICAL PURPOSES ONLY.  IF CONFIRMATION IS NEEDED FOR ANY PURPOSE, NOTIFY LAB WITHIN 5 DAYS.  LOWEST DETECTABLE LIMITS FOR URINE DRUG SCREEN Drug Class                     Cutoff (ng/mL) Amphetamine and metabolites    1000 Barbiturate and metabolites    200 Benzodiazepine                 200 Opiates and metabolites        300 Cocaine and metabolites        300 THC                            50 Performed at [facility], [address] [place], [place] [address]   Admission on 11/07/2022, Discharged on 11/07/2022  Component Date Value Ref Range Status   Acetaminophen (Tylenol), Serum 11/07/2022 <10 (L)  10 - 30 ug/mL Final   Comment: (NOTE) Therapeutic concentrations vary significantly. A range of 10-30 ug/mL  may be an effective concentration for many patients. However, some  are best treated at concentrations outside of this range. Acetaminophen concentrations >150 ug/mL at 4 hours after ingestion  and >50 ug/mL at 12 hours after ingestion are often associated with  toxic reactions.  Performed at [facility], [address] [place], [place] [address]    Sodium 11/07/2022 137  135 - 145 mmol/L Final   Potassium 11/07/2022 3.7  3.5 - 5.1 mmol/L Final   Chloride 11/07/2022 107  98 - 111 mmol/L Final   CO2 11/07/2022 22  22 - 32 mmol/L Final   Glucose, Bld 11/07/2022 147 (H)  70 - 99 mg/dL Final   Glucose reference range applies only to samples taken after fasting for at least 8 hours.   BUN 11/07/2022 18  6 - 20 mg/dL Final   Creatinine, Ser 11/07/2022 0.93  0.61 - 1.24 mg/dL Final   Calcium 29/56/2130 8.5 (L)  8.9 - 10.3 mg/dL Final    Total Protein 11/07/2022 6.5  6.5 - 8.1 g/dL Final   Albumin 86/57/8469 3.5  3.5 - 5.0 g/dL Final   AST 62/95/2841 24  15 - 41 U/L Final   ALT 11/07/2022 21  0 - 44 U/L Final   Alkaline Phosphatase 11/07/2022 65  38 - 126 U/L Final   Total Bilirubin 11/07/2022 1.0  0.3 - 1.2 mg/dL Final   GFR, Estimated 11/07/2022 >60  >60 mL/min Final   Comment: (NOTE) Calculated using the CKD-EPI Creatinine Equation (2021)    Anion gap 11/07/2022 8  5 - 15 Final   Performed at [facility], [address] [place], [place] [address]   Alcohol, Ethyl (B) 11/07/2022 <10  <10 mg/dL Final   Comment: (NOTE) Lowest detectable limit for serum alcohol is 10 mg/dL.  For medical  purposes only. Performed at [facility], [address] [place], [place] [address]    Salicylate Lvl 11/07/2022 <7.0 (L)  7.0 - 30.0 mg/dL Final   Performed at [facility], [address] [place], [place] [address]   WBC 11/07/2022 10.9 (H)  4.0 - 10.5 K/uL Final   RBC 11/07/2022 3.91 (L)  4.22 - 5.81 MIL/uL Final   Hemoglobin 11/07/2022 11.8 (L)  13.0 - 17.0 g/dL Final   HCT 24/40/1027 35.3 (L)  39.0 - 52.0 % Final   MCV 11/07/2022 90.3  80.0 - 100.0 fL Final   MCH 11/07/2022 30.2  26.0 - 34.0 pg Final   MCHC 11/07/2022 33.4  30.0 - 36.0 g/dL Final   RDW 25/36/6440 14.9  11.5 - 15.5 % Final   Platelets 11/07/2022 254  150 - 400 K/uL Final   nRBC 11/07/2022 0.0  0.0 - 0.2 % Final   Neutrophils Relative % 11/07/2022 84  % Final   Neutro Abs 11/07/2022 9.1 (H)  1.7 - 7.7 K/uL Final   Lymphocytes Relative 11/07/2022 7  % Final   Lymphs Abs 11/07/2022 0.8  0.7 - 4.0 K/uL Final   Monocytes Relative 11/07/2022 6  % Final   Monocytes Absolute 11/07/2022 0.6  0.1 - 1.0 K/uL Final   Eosinophils Relative 11/07/2022 2  % Final   Eosinophils Absolute 11/07/2022 0.2  0.0 - 0.5 K/uL Final   Basophils Relative 11/07/2022 1  % Final   Basophils Absolute 11/07/2022 0.1  0.0 - 0.1 K/uL Final   Immature  Granulocytes 11/07/2022 0  % Final   Abs Immature Granulocytes 11/07/2022 0.04  0.00 - 0.07 K/uL Final   Performed at [facility], [address] [place], [place] [address]  Admission on 10/27/2022, Discharged on 11/01/2022  Component Date Value Ref Range Status   Cholesterol 10/31/2022 203 (H)  0 - 200 mg/dL Final   Triglycerides 59/56/3875 196 (H)  <150 mg/dL Final   HDL 64/33/2951 84  >40 mg/dL Final   Total CHOL/HDL Ratio 10/31/2022 2.4  RATIO Final   VLDL 10/31/2022 39  0 - 40 mg/dL Final   LDL Cholesterol 10/31/2022 80  0 - 99 mg/dL Final   Comment:        Total Cholesterol/HDL:CHD Risk Coronary Heart Disease Risk Table                     Men   Women  1/2 Average Risk   3.4   3.3  Average Risk       5.0   4.4  2 X Average Risk   9.6   7.1  3 X Average Risk  23.4   11.0        Use the calculated Patient Ratio above and the CHD Risk Table to determine the patient's CHD Risk.        ATP III CLASSIFICATION (LDL):  <100     mg/dL   Optimal  884-166  mg/dL   Near or Above                    Optimal  130-159  mg/dL   Borderline  063-016  mg/dL   High  >010     mg/dL   Very High Performed at [facility], [address] [place], [place] [address]   Admission on 10/26/2022, Discharged on 10/27/2022  Component Date Value Ref Range Status   WBC 10/26/2022 9.6  4.0 - 10.5 K/uL  Final   RBC 10/26/2022 4.98  4.22 - 5.81 MIL/uL Final   Hemoglobin 10/26/2022 14.7  13.0 - 17.0 g/dL Final   HCT 23/55/7322 44.1  39.0 - 52.0 % Final   MCV 10/26/2022 88.6  80.0 - 100.0 fL Final   MCH 10/26/2022 29.5  26.0 - 34.0 pg Final   MCHC 10/26/2022 33.3  30.0 - 36.0 g/dL Final   RDW 02/54/2706 14.3  11.5 - 15.5 % Final   Platelets 10/26/2022 378  150 - 400 K/uL Final   nRBC 10/26/2022 0.0  0.0 - 0.2 % Final   Performed at [facility], [address] [place], [place] [address]   Sodium 10/26/2022 135  135 - 145 mmol/L Final   Potassium 10/26/2022 4.1  3.5 - 5.1  mmol/L Final   Chloride 10/26/2022 101  98 - 111 mmol/L Final   CO2 10/26/2022 23  22 - 32 mmol/L Final   Glucose, Bld 10/26/2022 93  70 - 99 mg/dL Final   Glucose reference range applies only to samples taken after fasting for at least 8 hours.   BUN 10/26/2022 15  6 - 20 mg/dL Final   Creatinine, Ser 10/26/2022 0.89  0.61 - 1.24 mg/dL Final   Calcium 83/15/1761 9.2  8.9 - 10.3 mg/dL Final   GFR, Estimated 10/26/2022 >60  >60 mL/min Final   Comment: (NOTE) Calculated using the CKD-EPI Creatinine Equation (2021)    Anion gap 10/26/2022 11  5 - 15 Final   Performed at [facility], [address] [place], [place] [address]   Troponin I (High Sensitivity) 10/26/2022 4  <18 ng/L Final   Comment: (NOTE) Elevated high sensitivity troponin I (hsTnI) values and significant  changes across serial measurements may suggest ACS but many other  chronic and acute conditions are known to elevate hsTnI results.  Refer to the "Links" section for chest pain algorithms and additional  guidance. Performed at [facility], [address] [place], [place] [address]    Acetaminophen (Tylenol), Serum 10/26/2022 <10 (L)  10 - 30 ug/mL Final   Comment: (NOTE) Therapeutic concentrations vary significantly. A range of 10-30 ug/mL  may be an effective concentration for many patients. However, some  are best treated at concentrations outside of this range. Acetaminophen concentrations >150 ug/mL at 4 hours after ingestion  and >50 ug/mL at 12 hours after ingestion are often associated with  toxic reactions.  Performed at [facility], [address] [place], [place] [address]    Salicylate Lvl 10/26/2022 <7.0 (L)  7.0 - 30.0 mg/dL Final   Performed at [facility], [address] [place], [place] [address]   Alcohol, Ethyl (B) 10/26/2022 <10  <10 mg/dL Final   Comment: (NOTE) Lowest detectable limit for serum alcohol is 10 mg/dL.  For medical purposes  only. Performed at [facility], [address] [place], [place] [address]    SARS Coronavirus 2 by RT PCR 10/26/2022 NEGATIVE  NEGATIVE Final   Comment: (NOTE) SARS-CoV-2 target nucleic acids are NOT DETECTED.  The SARS-CoV-2 RNA is generally detectable in upper respiratory specimens during the acute phase of infection. The lowest concentration of SARS-CoV-2 viral copies this assay can detect is 138 copies/mL. A negative result does not preclude SARS-Cov-2 infection and should not be used as the sole basis for treatment or other patient management decisions. A negative result may occur with  improper specimen collection/handling, submission of specimen other than nasopharyngeal swab, presence of viral mutation(s)  within the areas targeted by this assay, and inadequate number of viral copies(<138 copies/mL). A negative result must be combined with clinical observations, patient history, and epidemiological information. The expected result is Negative.  Fact Sheet for Patients:  BloggerCourse.com  Fact Sheet for Healthcare Providers:  SeriousBroker.it  This test is no                          t yet approved or cleared by the [place] FDA and  has been authorized for detection and/or diagnosis of SARS-CoV-2 by FDA under an Emergency Use Authorization (EUA). This EUA will remain  in effect (meaning this test can be used) for the duration of the COVID-19 declaration under Section 564(b)(1) of the Act, 21 U.S.C.section 360bbb-3(b)(1), unless the authorization is terminated  or revoked sooner.       Influenza A by PCR 10/26/2022 NEGATIVE  NEGATIVE Final   Influenza B by PCR 10/26/2022 NEGATIVE  NEGATIVE Final   Comment: (NOTE) The Xpert Xpress SARS-CoV-2/FLU/RSV plus assay is intended as an aid in the diagnosis of influenza from Nasopharyngeal swab specimens and should not be used as a sole basis for treatment.  Nasal washings and aspirates are unacceptable for Xpert Xpress SARS-CoV-2/FLU/RSV testing.  Fact Sheet for Patients: BloggerCourse.com  Fact Sheet for Healthcare Providers: SeriousBroker.it  This test is not yet approved or cleared by the [place] FDA and has been authorized for detection and/or diagnosis of SARS-CoV-2 by FDA under an Emergency Use Authorization (EUA). This EUA will remain in effect (meaning this test can be used) for the duration of the COVID-19 declaration under Section 564(b)(1) of the Act, 21 U.S.C. section 360bbb-3(b)(1), unless the authorization is terminated or revoked.     Resp Syncytial Virus by PCR 10/26/2022 NEGATIVE  NEGATIVE Final   Comment: (NOTE) Fact Sheet for Patients: BloggerCourse.com  Fact Sheet for Healthcare Providers: SeriousBroker.it  This test is not yet approved or cleared by the [place] FDA and has been authorized for detection and/or diagnosis of SARS-CoV-2 by FDA under an Emergency Use Authorization (EUA). This EUA will remain in effect (meaning this test can be used) for the duration of the COVID-19 declaration under Section 564(b)(1) of the Act, 21 U.S.C. section 360bbb-3(b)(1), unless the authorization is terminated or revoked.  Performed at [facility], [address] [place], [place] [address]    Troponin I (High Sensitivity) 10/26/2022 5  <18 ng/L Final   Comment: (NOTE) Elevated high sensitivity troponin I (hsTnI) values and significant  changes across serial measurements may suggest ACS but many other  chronic and acute conditions are known to elevate hsTnI results.  Refer to the "Links" section for chest pain algorithms and additional  guidance. Performed at [facility], [address] [place], [place] [address]    Tricyclic, Ur Screen 10/26/2022 NONE DETECTED  NONE DETECTED Final    Amphetamines, Ur Screen 10/26/2022 NONE DETECTED  NONE DETECTED Final   MDMA (Ecstasy)Ur Screen 10/26/2022 NONE DETECTED  NONE DETECTED Final   Cocaine Metabolite,Ur Crystal 10/26/2022 POSITIVE (A)  NONE DETECTED Final   Opiate, Ur Screen 10/26/2022 NONE DETECTED  NONE DETECTED Final   Phencyclidine (PCP) Ur S 10/26/2022 NONE DETECTED  NONE DETECTED Final   Cannabinoid 50 Ng, Ur Duncan 10/26/2022 NONE DETECTED  NONE DETECTED Final   Barbiturates, Ur Screen 10/26/2022 NONE DETECTED  NONE DETECTED Final   Benzodiazepine, Ur Scrn 10/26/2022 POSITIVE (A)  NONE DETECTED Final   Methadone  Scn, Ur 10/26/2022 NONE DETECTED  NONE DETECTED Final   Comment: (NOTE) Tricyclics + metabolites, urine    Cutoff 1000 ng/mL Amphetamines + metabolites, urine  Cutoff 1000 ng/mL MDMA (Ecstasy), urine              Cutoff 500 ng/mL Cocaine Metabolite, urine          Cutoff 300 ng/mL Opiate + metabolites, urine        Cutoff 300 ng/mL Phencyclidine (PCP), urine         Cutoff 25 ng/mL Cannabinoid, urine                 Cutoff 50 ng/mL Barbiturates + metabolites, urine  Cutoff 200 ng/mL Benzodiazepine, urine              Cutoff 200 ng/mL Methadone, urine                   Cutoff 300 ng/mL  The urine drug screen provides only a preliminary, unconfirmed analytical test result and should not be used for non-medical purposes. Clinical consideration and professional judgment should be applied to any positive drug screen result due to possible interfering substances. A more specific alternate chemical method must be used in order to obtain a confirmed analytical result. Gas chromatography / mass spectrometry (GC/MS) is the preferred confirm                          atory method. Performed at [facility], [address] [place], [place] [address]   Admission on 10/22/2022, Discharged on 10/24/2022  Component Date Value Ref Range Status   Sodium 10/22/2022 139  135 - 145 mmol/L Final   Potassium 10/22/2022 3.5   3.5 - 5.1 mmol/L Final   Chloride 10/22/2022 104  98 - 111 mmol/L Final   CO2 10/22/2022 27  22 - 32 mmol/L Final   Glucose, Bld 10/22/2022 128 (H)  70 - 99 mg/dL Final   Glucose reference range applies only to samples taken after fasting for at least 8 hours.   BUN 10/22/2022 14  6 - 20 mg/dL Final   Creatinine, Ser 10/22/2022 0.76  0.61 - 1.24 mg/dL Final   Calcium 67/20/9470 8.9  8.9 - 10.3 mg/dL Final   Total Protein 96/28/3662 7.8  6.5 - 8.1 g/dL Final   Albumin 94/76/5465 4.4  3.5 - 5.0 g/dL Final   AST 03/54/6568 22  15 - 41 U/L Final   ALT 10/22/2022 19  0 - 44 U/L Final   Alkaline Phosphatase 10/22/2022 69  38 - 126 U/L Final   Total Bilirubin 10/22/2022 1.1  0.3 - 1.2 mg/dL Final   GFR, Estimated 10/22/2022 >60  >60 mL/min Final   Comment: (NOTE) Calculated using the CKD-EPI Creatinine Equation (2021)    Anion gap 10/22/2022 8  5 - 15 Final   Performed at [facility], [address] [place], [place] [address]   WBC 10/22/2022 11.9 (H)  4.0 - 10.5 K/uL Final   RBC 10/22/2022 4.85  4.22 - 5.81 MIL/uL Final   Hemoglobin 10/22/2022 14.4  13.0 - 17.0 g/dL Final   HCT 70/01/7492 44.3  39.0 - 52.0 % Final   MCV 10/22/2022 91.3  80.0 - 100.0 fL Final   MCH 10/22/2022 29.7  26.0 - 34.0 pg Final   MCHC 10/22/2022 32.5  30.0 - 36.0 g/dL Final   RDW 49/67/5916 13.9  11.5 - 15.5 % Final  Platelets 10/22/2022 279  150 - 400 K/uL Final   nRBC 10/22/2022 0.0  0.0 - 0.2 % Final   Neutrophils Relative % 10/22/2022 72  % Final   Neutro Abs 10/22/2022 8.5 (H)  1.7 - 7.7 K/uL Final   Lymphocytes Relative 10/22/2022 14  % Final   Lymphs Abs 10/22/2022 1.7  0.7 - 4.0 K/uL Final   Monocytes Relative 10/22/2022 8  % Final   Monocytes Absolute 10/22/2022 1.0  0.1 - 1.0 K/uL Final   Eosinophils Relative 10/22/2022 5  % Final   Eosinophils Absolute 10/22/2022 0.7 (H)  0.0 - 0.5 K/uL Final   Basophils Relative 10/22/2022 1  % Final   Basophils Absolute 10/22/2022 0.1  0.0 - 0.1 K/uL  Final   Immature Granulocytes 10/22/2022 0  % Final   Abs Immature Granulocytes 10/22/2022 0.04  0.00 - 0.07 K/uL Final   Performed at [facility], [address] [place], [place] [address]   Troponin I (High Sensitivity) 10/22/2022 5  <18 ng/L Final   Comment: (NOTE) Elevated high sensitivity troponin I (hsTnI) values and significant  changes across serial measurements may suggest ACS but many other  chronic and acute conditions are known to elevate hsTnI results.  Refer to the "Links" section for chest pain algorithms and additional  guidance. Performed at [facility], [address] [place], [place] [address]    Troponin I (High Sensitivity) 10/22/2022 5  <18 ng/L Final   Comment: (NOTE) Elevated high sensitivity troponin I (hsTnI) values and significant  changes across serial measurements may suggest ACS but many other  chronic and acute conditions are known to elevate hsTnI results.  Refer to the "Links" section for chest pain algorithms and additional  guidance. Performed at [facility], [address] [place], [place] [address]    pH, Ven 10/22/2022 7.34  7.25 - 7.43 Final   pCO2, Ven 10/22/2022 57  44 - 60 mmHg Final   pO2, Ven 10/22/2022 37  32 - 45 mmHg Final   Bicarbonate 10/22/2022 30.8 (H)  20.0 - 28.0 mmol/L Final   Acid-Base Excess 10/22/2022 3.5 (H)  0.0 - 2.0 mmol/L Final   O2 Saturation 10/22/2022 54.4  % Final   Patient temperature 10/22/2022 37.0   Final   Collection site 10/22/2022 VEIN   Final   Performed at [facility], [address] [place], [place] [address]   Glucose-Capillary 10/22/2022 198 (H)  70 - 99 mg/dL Final   Glucose reference range applies only to samples taken after fasting for at least 8 hours.   O2 Content 10/22/2022 4.0  L/min Final   Delivery systems 10/22/2022 NASAL CANNULA   Final   pH, Arterial 10/22/2022 7.41  7.35 - 7.45 Final   pCO2 arterial 10/22/2022 40  32 - 48 mmHg Final   pO2,  Arterial 10/22/2022 124 (H)  83 - 108 mmHg Final   Bicarbonate 10/22/2022 25.4  20.0 - 28.0 mmol/L Final   Acid-Base Excess 10/22/2022 0.7  0.0 - 2.0 mmol/L Final   O2 Saturation 10/22/2022 99.3  % Final   Patient temperature 10/22/2022 37.0   Final   Collection site 10/22/2022 RIGHT RADIAL   Final   Allens test (pass/fail) 10/22/2022 PASS  PASS Final   Performed at [facility], [address], [place], [place] [address]  Admission on 09/24/2022, Discharged on 09/24/2022  Component Date Value Ref Range Status   Troponin I (High Sensitivity) 09/24/2022 5  <18 ng/L Final  Comment: (NOTE) Elevated high sensitivity troponin I (hsTnI) values and significant  changes across serial measurements may suggest ACS but many other  chronic and acute conditions are known to elevate hsTnI results.  Refer to the "Links" section for chest pain algorithms and additional  guidance. Performed at [facility], [address], [place], [place] [address]    SARS Coronavirus 2 by RT PCR 09/24/2022 NEGATIVE  NEGATIVE Final   Influenza A by PCR 09/24/2022 NEGATIVE  NEGATIVE Final   Influenza B by PCR 09/24/2022 NEGATIVE  NEGATIVE Final   Comment: (NOTE) The Xpert Xpress SARS-CoV-2/FLU/RSV plus assay is intended as an aid in the diagnosis of influenza from Nasopharyngeal swab specimens and should not be used as a sole basis for treatment. Nasal washings and aspirates are unacceptable for Xpert Xpress SARS-CoV-2/FLU/RSV testing.  Fact Sheet for Patients: BloggerCourse.com  Fact Sheet for Healthcare Providers: SeriousBroker.it  This test is not yet approved or cleared by the [place] FDA and has been authorized for detection and/or diagnosis of SARS-CoV-2 by FDA under an Emergency Use Authorization (EUA). This EUA will remain in effect (meaning this test can be used) for the duration of the COVID-19 declaration under Section 564(b)(1)  of the Act, 21 U.S.C. section 360bbb-3(b)(1), unless the authorization is terminated or revoked.     Resp Syncytial Virus by PCR 09/24/2022 NEGATIVE  NEGATIVE Final   Comment: (NOTE) Fact Sheet for Patients: BloggerCourse.com  Fact Sheet for Healthcare Providers: SeriousBroker.it  This test is not yet approved or cleared by the [place] FDA and has been authorized for detection and/or diagnosis of SARS-CoV-2 by FDA under an Emergency Use Authorization (EUA). This EUA will remain in effect (meaning this test can be used) for the duration of the COVID-19 declaration under Section 564(b)(1) of the Act, 21 U.S.C. section 360bbb-3(b)(1), unless the authorization is terminated or revoked.  Performed at [facility], [address] [place], [place] [address]    Sodium 09/24/2022 139  135 - 145 mmol/L Final   Potassium 09/24/2022 4.0  3.5 - 5.1 mmol/L Final   HEMOLYSIS AT THIS LEVEL MAY AFFECT RESULT   Chloride 09/24/2022 101  98 - 111 mmol/L Final   CO2 09/24/2022 27  22 - 32 mmol/L Final   Glucose, Bld 09/24/2022 121 (H)  70 - 99 mg/dL Final   Glucose reference range applies only to samples taken after fasting for at least 8 hours.   BUN 09/24/2022 10  6 - 20 mg/dL Final   Creatinine, Ser 09/24/2022 0.98  0.61 - 1.24 mg/dL Final   Calcium 09/81/1914 9.1  8.9 - 10.3 mg/dL Final   GFR, Estimated 09/24/2022 >60  >60 mL/min Final   Comment: (NOTE) Calculated using the CKD-EPI Creatinine Equation (2021)    Anion gap 09/24/2022 11  5 - 15 Final   Performed at [facility], [address] [place], [place] [address]   WBC 09/24/2022 8.0  4.0 - 10.5 K/uL Final   RBC 09/24/2022 4.78  4.22 - 5.81 MIL/uL Final   Hemoglobin 09/24/2022 14.3  13.0 - 17.0 g/dL Final   HCT 62/13/0865 42.7  39.0 - 52.0 % Final   MCV 09/24/2022 89.3  80.0 - 100.0 fL Final   MCH 09/24/2022 29.9  26.0 - 34.0 pg Final   MCHC 09/24/2022 33.5  30.0 -  36.0 g/dL Final   RDW 78/46/9629 14.0  11.5 - 15.5 % Final   Platelets 09/24/2022 333  150 - 400  K/uL Final   nRBC 09/24/2022 0.0  0.0 - 0.2 % Final   Neutrophils Relative % 09/24/2022 61  % Final   Neutro Abs 09/24/2022 4.8  1.7 - 7.7 K/uL Final   Lymphocytes Relative 09/24/2022 27  % Final   Lymphs Abs 09/24/2022 2.2  0.7 - 4.0 K/uL Final   Monocytes Relative 09/24/2022 8  % Final   Monocytes Absolute 09/24/2022 0.7  0.1 - 1.0 K/uL Final   Eosinophils Relative 09/24/2022 3  % Final   Eosinophils Absolute 09/24/2022 0.3  0.0 - 0.5 K/uL Final   Basophils Relative 09/24/2022 1  % Final   Basophils Absolute 09/24/2022 0.1  0.0 - 0.1 K/uL Final   Immature Granulocytes 09/24/2022 0  % Final   Abs Immature Granulocytes 09/24/2022 0.02  0.00 - 0.07 K/uL Final   Performed at [facility], [address] [place], [place] [address]  Office Visit on 07/12/2022  Component Date Value Ref Range Status   Hemoglobin A1C 07/12/2022 5.9 (A)  4.0 - 5.6 % Final  Admission on 06/30/2022, Discharged on 07/01/2022  Component Date Value Ref Range Status   WBC 06/30/2022 8.9  4.0 - 10.5 K/uL Final   RBC 06/30/2022 4.26  4.22 - 5.81 MIL/uL Final   Hemoglobin 06/30/2022 12.7 (L)  13.0 - 17.0 g/dL Final   HCT 60/45/4098 39.6  39.0 - 52.0 % Final   MCV 06/30/2022 93.0  80.0 - 100.0 fL Final   MCH 06/30/2022 29.8  26.0 - 34.0 pg Final   MCHC 06/30/2022 32.1  30.0 - 36.0 g/dL Final   RDW 11/91/4782 14.8  11.5 - 15.5 % Final   Platelets 06/30/2022 234  150 - 400 K/uL Final   nRBC 06/30/2022 0.0  0.0 - 0.2 % Final   Performed at [facility], [address], [place], [place] [address]   Sodium 06/30/2022 140  135 - 145 mmol/L Final   Potassium 06/30/2022 3.8  3.5 - 5.1 mmol/L Final   Chloride 06/30/2022 107  98 - 111 mmol/L Final   CO2 06/30/2022 27  22 - 32 mmol/L Final   Glucose, Bld 06/30/2022 108 (H)  70 - 99 mg/dL Final   Glucose reference range applies only to samples taken  after fasting for at least 8 hours.   BUN 06/30/2022 28 (H)  6 - 20 mg/dL Final   Creatinine, Ser 06/30/2022 1.18  0.61 - 1.24 mg/dL Final   Calcium 30/86/5784 9.0  8.9 - 10.3 mg/dL Final   GFR, Estimated 06/30/2022 >60  >60 mL/min Final   Comment: (NOTE) Calculated using the CKD-EPI Creatinine Equation (2021)    Anion gap 06/30/2022 6  5 - 15 Final   Performed at [facility], [address], [place], [place] [address]   Sodium 06/30/2022 140  135 - 145 mmol/L Final   Potassium 06/30/2022 3.7  3.5 - 5.1 mmol/L Final   Chloride 06/30/2022 105  98 - 111 mmol/L Final   BUN 06/30/2022 28 (H)  6 - 20 mg/dL Final   Creatinine, Ser 06/30/2022 1.10  0.61 - 1.24 mg/dL Final   Glucose, Bld 52/84/1324 147 (H)  70 - 99 mg/dL Final   Glucose reference range applies only to samples taken after fasting for at least 8 hours.   Calcium, Ion 06/30/2022 1.24  1.15 - 1.40 mmol/L Final   TCO2 06/30/2022 28  22 - 32 mmol/L Final   Hemoglobin 06/30/2022 12.6 (L)  13.0 - 17.0 g/dL  Final   HCT 06/30/2022 37.0 (L)  39.0 - 52.0 % Final   B Natriuretic Peptide 06/30/2022 47.7  0.0 - 100.0 pg/mL Final   Performed at [facility], [address], [place], [place] [address]   Troponin I (High Sensitivity) 07/01/2022 4  <18 ng/L Final   Comment: (NOTE) Elevated high sensitivity troponin I (hsTnI) values and significant  changes across serial measurements may suggest ACS but many other  chronic and acute conditions are known to elevate hsTnI results.  Refer to the "Links" section for chest pain algorithms and additional  guidance. Performed at [facility], [address], [place], [place] [address]   There may be more visits with results that are not included.    Allergies: Patient has no known allergies.  Medications:  PTA Medications  Medication Sig   carvedilol (COREG) 3.125 MG tablet Take 1 tablet (3.125 mg total) by mouth 2 (two) times daily  with a meal.   FLUoxetine (PROZAC) 20 MG capsule Take 1 capsule (20 mg total) by mouth daily for 10 days.   risperiDONE (RISPERDAL) 1 MG tablet Take 1 tablet (1 mg total) by mouth at bedtime for 10 days.   traZODone (DESYREL) 100 MG tablet Take 1 tablet (100 mg total) by mouth at bedtime as needed for up to 10 days for sleep.   benztropine (COGENTIN) 1 MG tablet Take 1 tablet (1 mg total) by mouth daily for 10 days.   topiramate (TOPAMAX) 50 MG tablet Take 1 tablet (50 mg total) by mouth daily for 10 days.   mometasone-formoterol (DULERA) 200-5 MCG/ACT AERO Inhale 2 puffs into the lungs 2 (two) times daily for 10 days.    Long Term Goals: Improvement in symptoms so as ready for discharge  Short Term Goals: Patient will verbalize feelings in meetings with treatment team members., Patient will attend at least of 50% of the groups daily., Pt will complete the PHQ9 on admission, day 3 and discharge., Patient will participate in completing the [place] Suicide Severity Rating Scale, Patient will score a low risk of violence for 24 hours prior to discharge, and Patient will take medications as prescribed daily.  Medical Decision Making  Patient presents to the [facility], after being transferred from [facility], due to cocaine abuse with cocaine induced mood disorder.  Patient reports that he last used crack cocaine and alcohol yesterday.  In addition to his substance abuse, patient endorses ongoing depression and anxiety.  Patient to be admitted to the [facility] for the management of his substance abuse.  Admission labs to be ordered and initiated.   Recommendations  Based on my evaluation the patient does not appear to have an emergency medical condition.  Meta Hatchet, PA 12/03/22  5:12 PM

## 2022-12-03 NOTE — Consult Note (Signed)
[facility] Face-to-Face Psychiatry Consult   Reason for Consult:  cocaine abuse Referring Physician:  EDP Patient Identification: Franklin [name] MRN:  [id number] Principal Diagnosis: Cocaine abuse with cocaine-induced mood disorder (HCC) Diagnosis:  Principal Problem:   Cocaine abuse with cocaine-induced mood disorder (HCC)   Total Time spent with patient: 45 minutes  Subjective:   Franklin [name] is a 52 y.o. male patient admitted with cocaine abuse.  HPI:  52 yo male presented to the ED after using cocaine and having some passive suicidal ideations on admission this am to the ED.  He was transferred from [facility] inpatient last Thursday to [facility] and reported he got there and never checked in because he was "stressed with everything" but could not expand.  Franklin [name] went back to the streets and started using again.  Now he wants to go to [facility] in [place], "I'm working on that", explained the team was looking at facility based crisis in [place] which he was not an opponent.  Discussed he could continue to work on his plan to go to [facility] in [place] from there.  No suicidal ideations or homicidal ideations, hallucinations, or withdrawal symptoms.  He reported drinking alcohol, none on toxicology, positive for cocaine and benzodiazepines, which he received in the ED with the Ativan detox protocol.  Low level of depression and anxiety over his current situation of being homeless and desiring to go to [facility] in [place].    Past Psychiatric History: cocaine and alcohol use d/o, anxiety, depression  Risk to Self:  none Risk to Others:  none Prior Inpatient Therapy:  multiple times Prior Outpatient Therapy:  none  Past Medical History:  Past Medical History:  Diagnosis Date   Anxiety    COPD (chronic obstructive pulmonary disease) (HCC)    Depression    Substance abuse (HCC)     Past Surgical History:  Procedure Laterality Date   NO PAST SURGERIES     Family History:  Family History   Problem Relation Age of Onset   Hypertension Other    Diabetes Other    Family Psychiatric  History: none Social History:  Social History   Substance and Sexual Activity  Alcohol Use Yes   Alcohol/week: 12.0 standard drinks of alcohol   Types: 12 Cans of beer per week     Social History   Substance and Sexual Activity  Drug Use Yes   Types: "Crack" cocaine, Cocaine   Comment: one gram daily    Social History   Socioeconomic History   Marital status: Single    Spouse name: Not on file   Number of children: Not on file   Years of education: Not on file   Highest education level: Not on file  Occupational History   Occupation: [employer]  Tobacco Use   Smoking status: Some Days    Current packs/day: 0.50    Types: Cigarettes   Smokeless tobacco: Never  Vaping Use   Vaping status: Never Used  Substance and Sexual Activity   Alcohol use: Yes    Alcohol/week: 12.0 standard drinks of alcohol    Types: 12 Cans of beer per week   Drug use: Yes    Types: "Crack" cocaine, Cocaine    Comment: one gram daily   Sexual activity: Yes  Other Topics Concern   Not on file  Social History Narrative   The patient was born and raised in with that by both his biological parents. His father's past way but his mother still living. He denies  any history of any physical or sexual abuse. He says he completed 2 years of college at [employer]. 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 He has never been married but has a 60 year old son who lives with his mother. He says he does get to see his son and has a relationship with him. He is not currently dating or in a relationship.      The patient does have a history of a DUI and had a court date last Friday for a DUI. He denies any other pending charges.            Social Determinants of Health   Financial Resource Strain: Not on file  Food Insecurity: Food Insecurity  Present (11/17/2022)   Hunger Vital Sign    Worried About Running Out of Food in the Last Year: Sometimes true    Ran Out of Food in the Last Year: Sometimes true  Transportation Needs: Unmet Transportation Needs (11/17/2022)   PRAPARE - Administrator, Civil Service (Medical): Yes    Lack of Transportation (Non-Medical): Yes  Physical Activity: Not on file  Stress: Not on file  Social Connections: Unknown (10/10/2022)   Received from [employer]   Social Network    Social Network: Not on file   Additional Social History:    Allergies:  No Known Allergies  Labs:  Results for orders placed or performed during the hospital encounter of 12/02/22 (from the past 48 hour(s))  Comprehensive metabolic panel     Status: Abnormal   Collection Time: 12/02/22  6:54 PM  Result Value Ref Range   Sodium 139 135 - 145 mmol/L   Potassium 3.9 3.5 - 5.1 mmol/L   Chloride 103 98 - 111 mmol/L   CO2 26 22 - 32 mmol/L   Glucose, Bld 162 (H) 70 - 99 mg/dL    Comment: Glucose reference range applies only to samples taken after fasting for at least 8 hours.   BUN 16 6 - 20 mg/dL   Creatinine, Ser 8.41 0.61 - 1.24 mg/dL   Calcium 9.0 8.9 - 32.4 mg/dL   Total Protein 6.8 6.5 - 8.1 g/dL   Albumin 4.0 3.5 - 5.0 g/dL   AST 29 15 - 41 U/L   ALT 19 0 - 44 U/L   Alkaline Phosphatase 85 38 - 126 U/L   Total Bilirubin 0.8 <1.2 mg/dL   GFR, Estimated >40 >10 mL/min    Comment: (NOTE) Calculated using the CKD-EPI Creatinine Equation (2021)    Anion gap 10 5 - 15    Comment: Performed at [facility], [address] [place], [place] [address]  cbc     Status: Abnormal   Collection Time: 12/02/22  6:54 PM  Result Value Ref Range   WBC 5.9 4.0 - 10.5 K/uL   RBC 4.21 (L) 4.22 - 5.81 MIL/uL   Hemoglobin 12.8 (L) 13.0 - 17.0 g/dL   HCT 66.4 (L) 40.3 - 47.4 %   MCV 91.4 80.0 - 100.0 fL   MCH 30.4 26.0 - 34.0 pg   MCHC 33.2 30.0 - 36.0 g/dL   RDW 25.9 56.3 - 87.5 %   Platelets 311  150 - 400 K/uL   nRBC 0.0 0.0 - 0.2 %    Comment: Performed at [facility], [address], [place], [place] [address]  Urine Drug Screen, Qualitative  Status: Abnormal   Collection Time: 12/02/22  6:54 PM  Result Value Ref Range   Tricyclic, Ur Screen NONE DETECTED NONE DETECTED   Amphetamines, Ur Screen NONE DETECTED NONE DETECTED   MDMA (Ecstasy)Ur Screen NONE DETECTED NONE DETECTED   Cocaine Metabolite,Ur [place] POSITIVE (A) NONE DETECTED   Opiate, Ur Screen NONE DETECTED NONE DETECTED   Phencyclidine (PCP) Ur S NONE DETECTED NONE DETECTED   Cannabinoid 50 Ng, Ur [place] NONE DETECTED NONE DETECTED   Barbiturates, Ur Screen NONE DETECTED NONE DETECTED   Benzodiazepine, Ur Scrn POSITIVE (A) NONE DETECTED   Methadone Scn, Ur NONE DETECTED NONE DETECTED    Comment: (NOTE) Tricyclics + metabolites, urine    Cutoff 1000 ng/mL Amphetamines + metabolites, urine  Cutoff 1000 ng/mL MDMA (Ecstasy), urine              Cutoff 500 ng/mL Cocaine Metabolite, urine          Cutoff 300 ng/mL Opiate + metabolites, urine        Cutoff 300 ng/mL Phencyclidine (PCP), urine         Cutoff 25 ng/mL Cannabinoid, urine                 Cutoff 50 ng/mL Barbiturates + metabolites, urine  Cutoff 200 ng/mL Benzodiazepine, urine              Cutoff 200 ng/mL Methadone, urine                   Cutoff 300 ng/mL  The urine drug screen provides only a preliminary, unconfirmed analytical test result and should not be used for non-medical purposes. Clinical consideration and professional judgment should be applied to any positive drug screen result due to possible interfering substances. A more specific alternate chemical method must be used in order to obtain a confirmed analytical result. Gas chromatography / mass spectrometry (GC/MS) is the preferred confirm atory method. Performed at [facility], [address], [place], [place] [address]   Acetaminophen level     Status: Abnormal    Collection Time: 12/02/22  8:57 PM  Result Value Ref Range   Acetaminophen (Tylenol), Serum <10 (L) 10 - 30 ug/mL    Comment: (NOTE) Therapeutic concentrations vary significantly. A range of 10-30 ug/mL  may be an effective concentration for many patients. However, some  are best treated at concentrations outside of this range. Acetaminophen concentrations >150 ug/mL at 4 hours after ingestion  and >50 ug/mL at 12 hours after ingestion are often associated with  toxic reactions.  Performed at [facility], [address] [place], [place] [address]   Ethanol     Status: None   Collection Time: 12/02/22  8:57 PM  Result Value Ref Range   Alcohol, Ethyl (B) <10 <10 mg/dL    Comment: (NOTE) Lowest detectable limit for serum alcohol is 10 mg/dL.  For medical purposes only. Performed at [facility], [address] [place], [place] [address]   Salicylate level     Status: Abnormal   Collection Time: 12/02/22  8:57 PM  Result Value Ref Range   Salicylate Lvl <7.0 (L) 7.0 - 30.0 mg/dL    Comment: Performed at [facility], [address] [place], [place] [address]    Current Facility-Administered Medications  Medication Dose Route Frequency Provider Last Rate Last Admin   folic acid (FOLVITE) tablet 1 mg  1 mg Oral Daily Pilar Jarvis, MD   1 mg at 12/03/22  1610   LORazepam (ATIVAN) tablet 1-4 mg  1-4 mg Oral Q1H PRN Pilar Jarvis, MD   1 mg at 12/02/22 1943   Or   LORazepam (ATIVAN) injection 1-4 mg  1-4 mg Intravenous Q1H PRN Pilar Jarvis, MD       multivitamin with minerals tablet 1 tablet  1 tablet Oral Daily Pilar Jarvis, MD   1 tablet at 12/03/22 9604   thiamine (VITAMIN B1) tablet 100 mg  100 mg Oral Daily Pilar Jarvis, MD   100 mg at 12/03/22 5409   Or   thiamine (VITAMIN B1) injection 100 mg  100 mg Intravenous Daily Pilar Jarvis, MD       Current Outpatient Medications  Medication Sig Dispense Refill   benztropine (COGENTIN) 1 MG tablet Take 1  tablet (1 mg total) by mouth daily for 10 days. 10 tablet 0   carvedilol (COREG) 3.125 MG tablet Take 1 tablet (3.125 mg total) by mouth 2 (two) times daily with a meal. 10 tablet 0   FLUoxetine (PROZAC) 20 MG capsule Take 1 capsule (20 mg total) by mouth daily for 10 days. 10 capsule 0   mometasone-formoterol (DULERA) 200-5 MCG/ACT AERO Inhale 2 puffs into the lungs 2 (two) times daily for 10 days. 1 each 0   risperiDONE (RISPERDAL) 1 MG tablet Take 1 tablet (1 mg total) by mouth at bedtime for 10 days. 10 tablet 0   topiramate (TOPAMAX) 50 MG tablet Take 1 tablet (50 mg total) by mouth daily for 10 days. 10 tablet 0   traZODone (DESYREL) 100 MG tablet Take 1 tablet (100 mg total) by mouth at bedtime as needed for up to 10 days for sleep. 10 tablet 0    Musculoskeletal: Strength & Muscle Tone: within normal limits Gait & Station: normal Patient leans: N/A  Psychiatric Specialty Exam: Physical Exam Vitals and nursing note reviewed.  Constitutional:      Appearance: Normal appearance.  HENT:     Head: Normocephalic.     Nose: Nose normal.  Pulmonary:     Effort: Pulmonary effort is normal.  Musculoskeletal:        General: Normal range of motion.     Cervical back: Normal range of motion.  Neurological:     General: No focal deficit present.     Mental Status: He is alert and oriented to person, place, and time.     Review of Systems  Psychiatric/Behavioral:  Positive for depression and substance abuse. The patient is nervous/anxious.   All other systems reviewed and are negative.   Blood pressure (!) 152/77, pulse 80, temperature 98.3 F (36.8 C), temperature source Oral, resp. rate 16, height 5\' 10"  (1.778 m), weight 65.8 kg, SpO2 94%.Body mass index is 20.81 kg/m.  General Appearance: Casual  Eye Contact:  Good  Speech:  Normal Rate  Volume:  Normal  Mood:  Anxious and Depressed  Affect:  Congruent  Thought Process:  Coherent  Orientation:  Full (Time, Place, and  Person)  Thought Content:  WDL and Logical  Suicidal Thoughts:  No  Homicidal Thoughts:  No  Memory:  Immediate;   Good Recent;   Good Remote;   Good  Judgement:  Fair  Insight:  Fair  Psychomotor Activity:  Normal  Concentration:  Concentration: Good and Attention Span: Good  Recall:  Good  Fund of Knowledge:  Fair  Language:  Good  Akathisia:  No  Handed:  Right  AIMS (if indicated):     Assets:  Leisure Time Physical Health Resilience  ADL's:  Intact  Cognition:  WNL  Sleep:        Physical Exam: Physical Exam Vitals and nursing note reviewed.  Constitutional:      Appearance: Normal appearance.  HENT:     Head: Normocephalic.     Nose: Nose normal.  Pulmonary:     Effort: Pulmonary effort is normal.  Musculoskeletal:        General: Normal range of motion.     Cervical back: Normal range of motion.  Neurological:     General: No focal deficit present.     Mental Status: He is alert and oriented to person, place, and time.    Review of Systems  Psychiatric/Behavioral:  Positive for depression and substance abuse. The patient is nervous/anxious.   All other systems reviewed and are negative.  Blood pressure (!) 152/77, pulse 80, temperature 98.3 F (36.8 C), temperature source Oral, resp. rate 16, height 5\' 10"  (1.778 m), weight 65.8 kg, SpO2 94%. Body mass index is 20.81 kg/m.  Treatment Plan Summary: Cocaine abuse with cocaine induced mood disorder: Facility based crisis  Disposition: No evidence of imminent risk to self or others at present.   Patient does not meet criteria for psychiatric inpatient admission. Supportive therapy provided about ongoing stressors.  Nanine Means, NP 12/03/2022 9:47 AM

## 2022-12-03 NOTE — Group Note (Signed)
Group Topic: Change and Accountability  Group Date: 12/03/2022 Start Time: 2015 End Time: 2100 Facilitators: Rae Lips B  Department: [facility]  Number of Participants: 2  Group Focus: abuse issues, activities of daily living skills, coping skills, daily focus, depression, feeling awareness/expression, forgiveness, goals/reality orientation, healthy friendships, individual meeting, problem solving, relapse prevention, safety plan, self-esteem, social skills, and substance abuse education Treatment Modality:  Exposure Therapy Interventions utilized were patient education, problem solving, and support Purpose: express feelings, increase insight, regain self-worth, reinforce self-care, and relapse prevention strategies  Name: Franklin [name] Date of Birth: [birth year]  MR: [id number]    Level of Participation: PT DID NOT ATTEND GROUP  Quality of Participation: cooperative Interactions with others: gave feedback Mood/Affect: appropriate Triggers (if applicable): NA Cognition: coherent/clear Progress: None Response: NA Plan: patient will be encouraged to go to groups.   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

## 2022-12-04 DIAGNOSIS — F1414 Cocaine abuse with cocaine-induced mood disorder: Secondary | ICD-10-CM | POA: Diagnosis not present

## 2022-12-04 DIAGNOSIS — F32A Depression, unspecified: Secondary | ICD-10-CM | POA: Diagnosis not present

## 2022-12-04 MED ORDER — FLUOXETINE HCL 20 MG PO CAPS
20.0000 mg | ORAL_CAPSULE | Freq: Every day | ORAL | Status: DC
  Administered 2022-12-04 – 2022-12-09 (×6): 20 mg via ORAL
  Filled 2022-12-04 (×3): qty 1
  Filled 2022-12-04: qty 14
  Filled 2022-12-04 (×3): qty 1

## 2022-12-04 MED ORDER — RISPERIDONE 1 MG PO TABS
1.0000 mg | ORAL_TABLET | Freq: Every day | ORAL | Status: DC
  Administered 2022-12-04 – 2022-12-08 (×5): 1 mg via ORAL
  Filled 2022-12-04: qty 1
  Filled 2022-12-04: qty 14
  Filled 2022-12-04 (×4): qty 1

## 2022-12-04 NOTE — ED Notes (Signed)
Pt was provided breakfast.

## 2022-12-04 NOTE — Group Note (Signed)
Group Topic: Relapse and Recovery  Group Date: 12/04/2022 Start Time: 2004 End Time: 2100 Facilitators: Rae Lips B  Department: [facility]  Number of Participants: 3  Group Focus: abuse issues, acceptance, chemical dependency education, chemical dependency issues, coping skills, depression, and relapse prevention Treatment Modality:  Leisure Counsellor and Spiritual Interventions utilized were leisure development, patient education, and support Purpose: express feelings, relapse prevention strategies, and trigger / craving management  Name: Franklin [name] Date of Birth: [birth year]  MR: [id number]    Level of Participation: PT DID NOT ATTEND GROUPS  Quality of Participation: cooperative Interactions with others: gave feedback Mood/Affect: appropriate Triggers (if applicable): NA Cognition: coherent/clear Progress: None Response: NA  Plan: patient will be encouraged to come out of his room and attend groups.   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

## 2022-12-04 NOTE — ED Notes (Signed)
Pt calm and composed in his room at this hour. No apparent distress. RR even and unlabored. Monitored for safety.

## 2022-12-04 NOTE — ED Notes (Signed)
   12/04/22 1253  Psych Admission Type (Psych Patients Only)  Admission Status Voluntary  Psychosocial Assessment  Patient Complaints Substance abuse;Depression  Eye Contact Fair  Facial Expression Flat  Affect Flat  Speech Logical/coherent  Interaction Assertive  Motor Activity Slow  Appearance/Hygiene Unremarkable  Behavior Characteristics Cooperative  Mood Depressed;Anxious  Aggressive Behavior  Targets Self  Type of Behavior Verbal  Effect No apparent injury  Thought Process  Coherency WDL  Content WDL  Delusions WDL  Perception WDL  Hallucination None reported or observed  Judgment Limited  Confusion None  Danger to Self  Current suicidal ideation? Denies  Description of Suicide Plan denies  Self-Injurious Behavior No self-injurious ideation or behavior indicators observed or expressed   Agreement Not to Harm Self Yes  Description of Agreement verbal  Danger to Others  Danger to Others None reported or observed

## 2022-12-04 NOTE — ED Provider Notes (Addendum)
Behavioral Health Progress Note  Date and Time: 12/04/2022 12:26 PM Name: Franklin [name] MRN:  [id number]  Subjective: Franklin [name] 52 year old African-American male who was seen and evaluated face-to-face by this provider.  He denied suicidal or homicidal ideations.  Denied auditory or visual hallucinations.  Patient was admitted due to substance abuse with cocaine.  Per chart review patient was transferred from [facility] for stabilization and hopes to attend residential treatment program at [facility].  He reports he has been in contact with [facility] in [place] for admission acceptance.   Franklin [name] reports he is currently prescribed Prozac and Risperdal which he had been taking and tolerating well.  Will restart home medications.  Reports using cocaine on and off for the past 20 years.  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  Rating his depression and anxiety 3 out of 10 with 10 being the worst.  Denied any goals for today as he states " I am just waking up, but I know I will be going to [facility] next week."  Substance-induced mood disorder: Cocaine abuse/misuse  Continue Ativan taper per CIWA protocol Restarted Prozac 20 mg daily Restarted Risperdal 1 mg nightly Continue trazodone 50 mg nightly as needed  CSW to start working on discharge disposition Patient encouraged to participate in therapeutic milieu  During evaluation Franklin [name] is resting in bed; he is alert/oriented x 3; calm/cooperative; and mood congruent with affect.  Patient is speaking in a clear tone at moderate volume, and normal pace; with good eye contact. His thought process is coherent and relevant; There is no indication that he is currently responding to internal/external stimuli or experiencing delusional thought content.  Patient denies suicidal/self-harm/homicidal ideation, psychosis, and paranoia.  Patient has remained calm throughout assessment and has answered questions  appropriately.   Diagnosis:  Final diagnoses:  Cocaine abuse with cocaine-induced mood disorder (HCC)    Total Time spent with patient: 15 minutes  Past Psychiatric History: see HPI Past Medical History:  Family History:  Family Psychiatric  History:  Social History:   Additional Social History:                        Sleep: Fair  Appetite:  Fair  Current Medications:  Current Facility-Administered Medications  Medication Dose Route Frequency Provider Last Rate Last Admin   acetaminophen (TYLENOL) tablet 650 mg  650 mg Oral Q6H PRN Nwoko, Uchenna E, PA   650 mg at 12/03/22 2002   alum & mag hydroxide-simeth (MAALOX/MYLANTA) 200-200-20 MG/5ML suspension 30 mL  30 mL Oral Q4H PRN Nwoko, Uchenna E, PA       diphenhydrAMINE (BENADRYL) capsule 50 mg  50 mg Oral Q6H PRN Nwoko, Uchenna E, PA       Or   diphenhydrAMINE (BENADRYL) injection 50 mg  50 mg Intramuscular Q6H PRN Nwoko, Uchenna E, PA       haloperidol (HALDOL) tablet 5 mg  5 mg Oral Q6H PRN Nwoko, Uchenna E, PA       Or   haloperidol lactate (HALDOL) injection 5 mg  5 mg Intramuscular Q6H PRN Nwoko, Uchenna E, PA       hydrOXYzine (ATARAX) tablet 25 mg  25 mg Oral TID PRN Nwoko, Uchenna E, PA       hydrOXYzine (ATARAX) tablet 25 mg  25 mg Oral Q6H PRN Nwoko, Uchenna E, PA       loperamide (IMODIUM) capsule 2-4 mg  2-4 mg Oral PRN  Nwoko, Uchenna E, PA       LORazepam (ATIVAN) tablet 2 mg  2 mg Oral Q6H PRN Nwoko, Uchenna E, PA       Or   LORazepam (ATIVAN) injection 2 mg  2 mg Intramuscular Q6H PRN Nwoko, Uchenna E, PA       LORazepam (ATIVAN) tablet 1 mg  1 mg Oral QID Nwoko, Uchenna E, PA   1 mg at 12/04/22 0454   Followed by   [name] ON 12/05/2022] LORazepam (ATIVAN) tablet 1 mg  1 mg Oral TID Nwoko, Uchenna E, PA       Followed by   [name] ON 12/06/2022] LORazepam (ATIVAN) tablet 1 mg  1 mg Oral BID Nwoko, Uchenna E, PA       Followed by   [name] ON 12/07/2022] LORazepam (ATIVAN) tablet 1 mg  1 mg Oral Daily  Nwoko, Uchenna E, PA       magnesium hydroxide (MILK OF MAGNESIA) suspension 30 mL  30 mL Oral Daily PRN Nwoko, Uchenna E, PA       multivitamin with minerals tablet 1 tablet  1 tablet Oral Daily Nwoko, Uchenna E, PA   1 tablet at 12/04/22 0922   ondansetron (ZOFRAN-ODT) disintegrating tablet 4 mg  4 mg Oral Q6H PRN Nwoko, Uchenna E, PA       thiamine (VITAMIN B1) tablet 100 mg  100 mg Oral Daily Nwoko, Uchenna E, PA   100 mg at 12/04/22 0981   traZODone (DESYREL) tablet 50 mg  50 mg Oral QHS PRN Nwoko, Uchenna E, PA       Current Outpatient Medications  Medication Sig Dispense Refill   benztropine (COGENTIN) 1 MG tablet Take 1 tablet (1 mg total) by mouth daily for 10 days. 10 tablet 0   carvedilol (COREG) 3.125 MG tablet Take 1 tablet (3.125 mg total) by mouth 2 (two) times daily with a meal. 10 tablet 0   FLUoxetine (PROZAC) 20 MG capsule Take 1 capsule (20 mg total) by mouth daily for 10 days. 10 capsule 0   mometasone-formoterol (DULERA) 200-5 MCG/ACT AERO Inhale 2 puffs into the lungs 2 (two) times daily for 10 days. 1 each 0   risperiDONE (RISPERDAL) 1 MG tablet Take 1 tablet (1 mg total) by mouth at bedtime for 10 days. 10 tablet 0   topiramate (TOPAMAX) 50 MG tablet Take 1 tablet (50 mg total) by mouth daily for 10 days. 10 tablet 0   traZODone (DESYREL) 100 MG tablet Take 1 tablet (100 mg total) by mouth at bedtime as needed for up to 10 days for sleep. 10 tablet 0    Labs  Lab Results:  Admission on 12/02/2022, Discharged on 12/03/2022  Component Date Value Ref Range Status   Sodium 12/02/2022 139  135 - 145 mmol/L Final   Potassium 12/02/2022 3.9  3.5 - 5.1 mmol/L Final   Chloride 12/02/2022 103  98 - 111 mmol/L Final   CO2 12/02/2022 26  22 - 32 mmol/L Final   Glucose, Bld 12/02/2022 162 (H)  70 - 99 mg/dL Final   Glucose reference range applies only to samples taken after fasting for at least 8 hours.   BUN 12/02/2022 16  6 - 20 mg/dL Final   Creatinine, Ser 12/02/2022 1.11   0.61 - 1.24 mg/dL Final   Calcium 19/14/7829 9.0  8.9 - 10.3 mg/dL Final   Total Protein 56/21/3086 6.8  6.5 - 8.1 g/dL Final   Albumin 57/84/6962 4.0  3.5 - 5.0  g/dL Final   AST 32/44/0102 29  15 - 41 U/L Final   ALT 12/02/2022 19  0 - 44 U/L Final   Alkaline Phosphatase 12/02/2022 85  38 - 126 U/L Final   Total Bilirubin 12/02/2022 0.8  <1.2 mg/dL Final   GFR, Estimated 12/02/2022 >60  >60 mL/min Final   Comment: (NOTE) Calculated using the CKD-EPI Creatinine Equation (2021)    Anion gap 12/02/2022 10  5 - 15 Final   Performed at [facility], [address] [place], [place] [address]   WBC 12/02/2022 5.9  4.0 - 10.5 K/uL Final   RBC 12/02/2022 4.21 (L)  4.22 - 5.81 MIL/uL Final   Hemoglobin 12/02/2022 12.8 (L)  13.0 - 17.0 g/dL Final   HCT 64/40/3474 38.5 (L)  39.0 - 52.0 % Final   MCV 12/02/2022 91.4  80.0 - 100.0 fL Final   MCH 12/02/2022 30.4  26.0 - 34.0 pg Final   MCHC 12/02/2022 33.2  30.0 - 36.0 g/dL Final   RDW 25/95/6387 14.4  11.5 - 15.5 % Final   Platelets 12/02/2022 311  150 - 400 K/uL Final   nRBC 12/02/2022 0.0  0.0 - 0.2 % Final   Performed at [facility], [address] [place], [place] [address]   Tricyclic, Ur Screen 12/02/2022 NONE DETECTED  NONE DETECTED Final   Amphetamines, Ur Screen 12/02/2022 NONE DETECTED  NONE DETECTED Final   MDMA (Ecstasy)Ur Screen 12/02/2022 NONE DETECTED  NONE DETECTED Final   Cocaine Metabolite,Ur [place] 12/02/2022 POSITIVE (A)  NONE DETECTED Final   Opiate, Ur Screen 12/02/2022 NONE DETECTED  NONE DETECTED Final   Phencyclidine (PCP) Ur S 12/02/2022 NONE DETECTED  NONE DETECTED Final   Cannabinoid 50 Ng, Ur [place] 12/02/2022 NONE DETECTED  NONE DETECTED Final   Barbiturates, Ur Screen 12/02/2022 NONE DETECTED  NONE DETECTED Final   Benzodiazepine, Ur Scrn 12/02/2022 POSITIVE (A)  NONE DETECTED Final   Methadone Scn, Ur 12/02/2022 NONE DETECTED  NONE DETECTED Final   Comment: (NOTE) Tricyclics + metabolites, urine     Cutoff 1000 ng/mL Amphetamines + metabolites, urine  Cutoff 1000 ng/mL MDMA (Ecstasy), urine              Cutoff 500 ng/mL Cocaine Metabolite, urine          Cutoff 300 ng/mL Opiate + metabolites, urine        Cutoff 300 ng/mL Phencyclidine (PCP), urine         Cutoff 25 ng/mL Cannabinoid, urine                 Cutoff 50 ng/mL Barbiturates + metabolites, urine  Cutoff 200 ng/mL Benzodiazepine, urine              Cutoff 200 ng/mL Methadone, urine                   Cutoff 300 ng/mL  The urine drug screen provides only a preliminary, unconfirmed analytical test result and should not be used for non-medical purposes. Clinical consideration and professional judgment should be applied to any positive drug screen result due to possible interfering substances. A more specific alternate chemical method must be used in order to obtain a confirmed analytical result. Gas chromatography / mass spectrometry (GC/MS) is the preferred confirm                          atory method. Performed at [facility], [address] [place], [place] [address]    Acetaminophen (Tylenol), Serum 12/02/2022 <10 (L)  10 - 30 ug/mL Final   Comment: (NOTE) Therapeutic concentrations vary significantly. A range of 10-30 ug/mL  may be an effective concentration for many patients. However, some  are best treated at concentrations outside of this range. Acetaminophen concentrations >150 ug/mL at 4 hours after ingestion  and >50 ug/mL at 12 hours after ingestion are often associated with  toxic reactions.  Performed at [facility], [address] [place], [place] [address]    Alcohol, Ethyl (B) 12/02/2022 <10  <10 mg/dL Final   Comment: (NOTE) Lowest detectable limit for serum alcohol is 10 mg/dL.  For medical purposes only. Performed at [facility], [address] [place], [place] [address]    Salicylate Lvl 12/02/2022 <7.0 (L)  7.0 - 30.0 mg/dL Final   Performed at [facility], [address] [place], [place] [address]  Admission on 11/17/2022, Discharged on 11/17/2022  Component Date Value Ref Range Status   Sodium 11/17/2022 139  135 - 145 mmol/L Final   Potassium 11/17/2022 4.1  3.5 - 5.1 mmol/L Final   Chloride 11/17/2022 100  98 - 111 mmol/L Final   CO2 11/17/2022 29  22 - 32 mmol/L Final   Glucose, Bld 11/17/2022 90  70 - 99 mg/dL Final   Glucose reference range applies only to samples taken after fasting for at least 8 hours.   BUN 11/17/2022 10  6 - 20 mg/dL Final   Creatinine, Ser 11/17/2022 0.68  0.61 - 1.24 mg/dL Final   Calcium 57/84/6962 9.0  8.9 - 10.3 mg/dL Final   Total Protein 95/28/4132 8.1  6.5 - 8.1 g/dL Final   Albumin 44/01/270 4.6  3.5 - 5.0 g/dL Final   AST 53/66/4403 22  15 - 41 U/L Final   ALT 11/17/2022 20  0 - 44 U/L Final   Alkaline Phosphatase 11/17/2022 61  38 - 126 U/L Final   Total Bilirubin 11/17/2022 1.1  <1.2 mg/dL Final   GFR, Estimated 11/17/2022 >60  >60 mL/min Final   Comment: (NOTE) Calculated using the CKD-EPI Creatinine Equation (2021)    Anion gap 11/17/2022 10  5 - 15 Final   Performed at [facility], [address] [place], [place] [address]   Alcohol, Ethyl (B) 11/17/2022 <10  <10 mg/dL Final   Comment: (NOTE) Lowest detectable limit for serum alcohol is 10 mg/dL.  For medical purposes only. Performed at [facility], [address] [place], [place] [address]    Salicylate Lvl 11/17/2022 <7.0 (L)  7.0 - 30.0 mg/dL Final   Performed at [facility], [address] [place], [place] [address]   Acetaminophen (Tylenol), Serum 11/17/2022 <10 (L)  10 - 30 ug/mL Final   Comment: (NOTE) Therapeutic concentrations vary significantly. A range of 10-30 ug/mL  may be an effective concentration for many patients. However, some  are best treated at concentrations outside of this range. Acetaminophen concentrations >150 ug/mL at 4 hours after ingestion  and >50 ug/mL  at 12 hours after ingestion are often associated with  toxic reactions.  Performed at [facility], [address] [place], [place] [address]    WBC 11/17/2022 7.6  4.0 - 10.5 K/uL Final   RBC 11/17/2022 5.07  4.22 - 5.81 MIL/uL Final   Hemoglobin 11/17/2022 15.2  13.0 - 17.0 g/dL Final   HCT 88/41/6606 46.8  39.0 - 52.0 % Final  MCV 11/17/2022 92.3  80.0 - 100.0 fL Final   MCH 11/17/2022 30.0  26.0 - 34.0 pg Final   MCHC 11/17/2022 32.5  30.0 - 36.0 g/dL Final   RDW 95/62/1308 14.6  11.5 - 15.5 % Final   Platelets 11/17/2022 345  150 - 400 K/uL Final   nRBC 11/17/2022 0.0  0.0 - 0.2 % Final   Performed at [facility], [address] [place], [place] [address]   Tricyclic, Ur Screen 11/17/2022 NONE DETECTED  NONE DETECTED Final   Amphetamines, Ur Screen 11/17/2022 NONE DETECTED  NONE DETECTED Final   MDMA (Ecstasy)Ur Screen 11/17/2022 NONE DETECTED  NONE DETECTED Final   Cocaine Metabolite,Ur [place] 11/17/2022 POSITIVE (A)  NONE DETECTED Final   Opiate, Ur Screen 11/17/2022 NONE DETECTED  NONE DETECTED Final   Phencyclidine (PCP) Ur S 11/17/2022 NONE DETECTED  NONE DETECTED Final   Cannabinoid 50 Ng, Ur [place] 11/17/2022 POSITIVE (A)  NONE DETECTED Final   Barbiturates, Ur Screen 11/17/2022 NONE DETECTED  NONE DETECTED Final   Benzodiazepine, Ur Scrn 11/17/2022 POSITIVE (A)  NONE DETECTED Final   Methadone Scn, Ur 11/17/2022 NONE DETECTED  NONE DETECTED Final   Comment: (NOTE) Tricyclics + metabolites, urine    Cutoff 1000 ng/mL Amphetamines + metabolites, urine  Cutoff 1000 ng/mL MDMA (Ecstasy), urine              Cutoff 500 ng/mL Cocaine Metabolite, urine          Cutoff 300 ng/mL Opiate + metabolites, urine        Cutoff 300 ng/mL Phencyclidine (PCP), urine         Cutoff 25 ng/mL Cannabinoid, urine                 Cutoff 50 ng/mL Barbiturates + metabolites, urine  Cutoff 200 ng/mL Benzodiazepine, urine              Cutoff 200 ng/mL Methadone, urine                    Cutoff 300 ng/mL  The urine drug screen provides only a preliminary, unconfirmed analytical test result and should not be used for non-medical purposes. Clinical consideration and professional judgment should be applied to any positive drug screen result due to possible interfering substances. A more specific alternate chemical method must be used in order to obtain a confirmed analytical result. Gas chromatography / mass spectrometry (GC/MS) is the preferred confirm                          atory method. Performed at [facility], [address] [place], [place] [address]   Admission on 11/13/2022, Discharged on 11/13/2022  Component Date Value Ref Range Status   Sodium 11/13/2022 141  135 - 145 mmol/L Final   Potassium 11/13/2022 4.0  3.5 - 5.1 mmol/L Final   Chloride 11/13/2022 108  98 - 111 mmol/L Final   CO2 11/13/2022 26  22 - 32 mmol/L Final   Glucose, Bld 11/13/2022 126 (H)  70 - 99 mg/dL Final   Glucose reference range applies only to samples taken after fasting for at least 8 hours.   BUN 11/13/2022 14  6 - 20 mg/dL Final   Creatinine, Ser 11/13/2022 0.91  0.61 - 1.24 mg/dL Final   Calcium 52/84/1324 9.1  8.9 - 10.3 mg/dL Final   Total Protein 40/10/2723 7.2  6.5 - 8.1 g/dL Final   Albumin  11/13/2022 4.1  3.5 - 5.0 g/dL Final   AST 16/10/9602 30  15 - 41 U/L Final   ALT 11/13/2022 23  0 - 44 U/L Final   Alkaline Phosphatase 11/13/2022 76  38 - 126 U/L Final   Total Bilirubin 11/13/2022 1.9 (H)  <1.2 mg/dL Final   GFR, Estimated 11/13/2022 >60  >60 mL/min Final   Comment: (NOTE) Calculated using the CKD-EPI Creatinine Equation (2021)    Anion gap 11/13/2022 7  5 - 15 Final   Performed at [facility], [address], [place], [place] [address]   Alcohol, Ethyl (B) 11/13/2022 <10  <10 mg/dL Final   Comment: (NOTE) Lowest detectable limit for serum alcohol is 10 mg/dL.  For medical purposes only. Performed at [facility], [address], [place], [place] [address]    Salicylate Lvl 11/13/2022 <7.0 (L)  7.0 - 30.0 mg/dL Final   Performed at [facility], [address], [place], [place] [address]   Acetaminophen (Tylenol), Serum 11/13/2022 <10 (L)  10 - 30 ug/mL Final   Comment: (NOTE) Therapeutic concentrations vary significantly. A range of 10-30 ug/mL  may be an effective concentration for many patients. However, some  are best treated at concentrations outside of this range. Acetaminophen concentrations >150 ug/mL at 4 hours after ingestion  and >50 ug/mL at 12 hours after ingestion are often associated with  toxic reactions.  Performed at [facility], [address], [place], [place] [address]    WBC 11/13/2022 7.6  4.0 - 10.5 K/uL Final   RBC 11/13/2022 4.70  4.22 - 5.81 MIL/uL Final   Hemoglobin 11/13/2022 14.2  13.0 - 17.0 g/dL Final   HCT 86/57/8469 43.6  39.0 - 52.0 % Final   MCV 11/13/2022 92.8  80.0 - 100.0 fL Final   MCH 11/13/2022 30.2  26.0 - 34.0 pg Final   MCHC 11/13/2022 32.6  30.0 - 36.0 g/dL Final   RDW 62/95/2841 15.0  11.5 - 15.5 % Final   Platelets 11/13/2022 308  150 - 400 K/uL Final   nRBC 11/13/2022 0.0  0.0 - 0.2 % Final   Performed at [facility], [address] [place], [place] [address]   Opiates 11/13/2022 NONE DETECTED  NONE DETECTED Final   Cocaine 11/13/2022 POSITIVE (A)  NONE DETECTED Final   Benzodiazepines 11/13/2022 POSITIVE (A)  NONE DETECTED Final   Amphetamines 11/13/2022 NONE DETECTED  NONE DETECTED Final   Tetrahydrocannabinol 11/13/2022 POSITIVE (A)  NONE DETECTED Final   Barbiturates 11/13/2022 NONE DETECTED  NONE DETECTED Final   Comment: (NOTE) DRUG SCREEN FOR MEDICAL PURPOSES ONLY.  IF CONFIRMATION IS NEEDED FOR ANY PURPOSE, NOTIFY LAB WITHIN 5 DAYS.  LOWEST DETECTABLE LIMITS FOR URINE DRUG SCREEN Drug Class                     Cutoff (ng/mL) Amphetamine and metabolites     1000 Barbiturate and metabolites    200 Benzodiazepine                 200 Opiates and metabolites        300 Cocaine and metabolites        300 THC                            50 Performed at [facility], [address] [place], [place] [address]  Admission on 11/07/2022, Discharged on 11/07/2022  Component Date Value Ref Range Status   Acetaminophen (Tylenol), Serum 11/07/2022 <10 (L)  10 - 30 ug/mL Final   Comment: (NOTE) Therapeutic concentrations vary significantly. A range of 10-30 ug/mL  may be an effective concentration for many patients. However, some  are best treated at concentrations outside of this range. Acetaminophen concentrations >150 ug/mL at 4 hours after ingestion  and >50 ug/mL at 12 hours after ingestion are often associated with  toxic reactions.  Performed at [facility], [address] [place], [place] [address]    Sodium 11/07/2022 137  135 - 145 mmol/L Final   Potassium 11/07/2022 3.7  3.5 - 5.1 mmol/L Final   Chloride 11/07/2022 107  98 - 111 mmol/L Final   CO2 11/07/2022 22  22 - 32 mmol/L Final   Glucose, Bld 11/07/2022 147 (H)  70 - 99 mg/dL Final   Glucose reference range applies only to samples taken after fasting for at least 8 hours.   BUN 11/07/2022 18  6 - 20 mg/dL Final   Creatinine, Ser 11/07/2022 0.93  0.61 - 1.24 mg/dL Final   Calcium 60/45/4098 8.5 (L)  8.9 - 10.3 mg/dL Final   Total Protein 11/91/4782 6.5  6.5 - 8.1 g/dL Final   Albumin 95/62/1308 3.5  3.5 - 5.0 g/dL Final   AST 65/78/4696 24  15 - 41 U/L Final   ALT 11/07/2022 21  0 - 44 U/L Final   Alkaline Phosphatase 11/07/2022 65  38 - 126 U/L Final   Total Bilirubin 11/07/2022 1.0  0.3 - 1.2 mg/dL Final   GFR, Estimated 11/07/2022 >60  >60 mL/min Final   Comment: (NOTE) Calculated using the CKD-EPI Creatinine Equation (2021)    Anion gap 11/07/2022 8  5 - 15 Final   Performed at [facility], [address] [place], [place]  [address]   Alcohol, Ethyl (B) 11/07/2022 <10  <10 mg/dL Final   Comment: (NOTE) Lowest detectable limit for serum alcohol is 10 mg/dL.  For medical purposes only. Performed at [facility], [address] [place], [place] [address]    Salicylate Lvl 11/07/2022 <7.0 (L)  7.0 - 30.0 mg/dL Final   Performed at [facility], [address] [place], [place] [address]   WBC 11/07/2022 10.9 (H)  4.0 - 10.5 K/uL Final   RBC 11/07/2022 3.91 (L)  4.22 - 5.81 MIL/uL Final   Hemoglobin 11/07/2022 11.8 (L)  13.0 - 17.0 g/dL Final   HCT 72/53/6644 35.3 (L)  39.0 - 52.0 % Final   MCV 11/07/2022 90.3  80.0 - 100.0 fL Final   MCH 11/07/2022 30.2  26.0 - 34.0 pg Final   MCHC 11/07/2022 33.4  30.0 - 36.0 g/dL Final   RDW 03/47/4259 14.9  11.5 - 15.5 % Final   Platelets 11/07/2022 254  150 - 400 K/uL Final   nRBC 11/07/2022 0.0  0.0 - 0.2 % Final   Neutrophils Relative % 11/07/2022 84  % Final   Neutro Abs 11/07/2022 9.1 (H)  1.7 - 7.7 K/uL Final   Lymphocytes Relative 11/07/2022 7  % Final   Lymphs Abs 11/07/2022 0.8  0.7 - 4.0 K/uL Final   Monocytes Relative 11/07/2022 6  % Final   Monocytes Absolute 11/07/2022 0.6  0.1 - 1.0 K/uL Final   Eosinophils Relative 11/07/2022 2  % Final   Eosinophils Absolute 11/07/2022 0.2  0.0 - 0.5 K/uL Final   Basophils Relative 11/07/2022  1  % Final   Basophils Absolute 11/07/2022 0.1  0.0 - 0.1 K/uL Final   Immature Granulocytes 11/07/2022 0  % Final   Abs Immature Granulocytes 11/07/2022 0.04  0.00 - 0.07 K/uL Final   Performed at [facility], [address] [place], [place] [address]  Admission on 10/27/2022, Discharged on 11/01/2022  Component Date Value Ref Range Status   Cholesterol 10/31/2022 203 (H)  0 - 200 mg/dL Final   Triglycerides 60/45/4098 196 (H)  <150 mg/dL Final   HDL 11/91/4782 84  >40 mg/dL Final   Total CHOL/HDL Ratio 10/31/2022 2.4  RATIO Final   VLDL 10/31/2022 39  0 - 40 mg/dL Final   LDL Cholesterol  10/31/2022 80  0 - 99 mg/dL Final   Comment:        Total Cholesterol/HDL:CHD Risk Coronary Heart Disease Risk Table                     Men   Women  1/2 Average Risk   3.4   3.3  Average Risk       5.0   4.4  2 X Average Risk   9.6   7.1  3 X Average Risk  23.4   11.0        Use the calculated Patient Ratio above and the CHD Risk Table to determine the patient's CHD Risk.        ATP III CLASSIFICATION (LDL):  <100     mg/dL   Optimal  956-213  mg/dL   Near or Above                    Optimal  130-159  mg/dL   Borderline  086-578  mg/dL   High  >469     mg/dL   Very High Performed at [facility], [address] [place], [place] [address]   Admission on 10/26/2022, Discharged on 10/27/2022  Component Date Value Ref Range Status   WBC 10/26/2022 9.6  4.0 - 10.5 K/uL Final   RBC 10/26/2022 4.98  4.22 - 5.81 MIL/uL Final   Hemoglobin 10/26/2022 14.7  13.0 - 17.0 g/dL Final   HCT 84/13/2440 44.1  39.0 - 52.0 % Final   MCV 10/26/2022 88.6  80.0 - 100.0 fL Final   MCH 10/26/2022 29.5  26.0 - 34.0 pg Final   MCHC 10/26/2022 33.3  30.0 - 36.0 g/dL Final   RDW 10/31/2534 14.3  11.5 - 15.5 % Final   Platelets 10/26/2022 378  150 - 400 K/uL Final   nRBC 10/26/2022 0.0  0.0 - 0.2 % Final   Performed at [facility], [address] [place], [place] [address]   Sodium 10/26/2022 135  135 - 145 mmol/L Final   Potassium 10/26/2022 4.1  3.5 - 5.1 mmol/L Final   Chloride 10/26/2022 101  98 - 111 mmol/L Final   CO2 10/26/2022 23  22 - 32 mmol/L Final   Glucose, Bld 10/26/2022 93  70 - 99 mg/dL Final   Glucose reference range applies only to samples taken after fasting for at least 8 hours.   BUN 10/26/2022 15  6 - 20 mg/dL Final   Creatinine, Ser 10/26/2022 0.89  0.61 - 1.24 mg/dL Final   Calcium 47/42/5956 9.2  8.9 - 10.3 mg/dL Final   GFR, Estimated 10/26/2022 >60  >60 mL/min Final   Comment: (NOTE) Calculated using the CKD-EPI Creatinine Equation (2021)    Anion  gap 10/26/2022 11  5 - 15 Final   Performed at [facility], [address] [place], [place] [address]   Troponin I (High Sensitivity) 10/26/2022 4  <18 ng/L Final   Comment: (NOTE) Elevated high sensitivity troponin I (hsTnI) values and significant  changes across serial measurements may suggest ACS but many other  chronic and acute conditions are known to elevate hsTnI results.  Refer to the "Links" section for chest pain algorithms and additional  guidance. Performed at [facility], [address] [place], [place] [address]    Acetaminophen (Tylenol), Serum 10/26/2022 <10 (L)  10 - 30 ug/mL Final   Comment: (NOTE) Therapeutic concentrations vary significantly. A range of 10-30 ug/mL  may be an effective concentration for many patients. However, some  are best treated at concentrations outside of this range. Acetaminophen concentrations >150 ug/mL at 4 hours after ingestion  and >50 ug/mL at 12 hours after ingestion are often associated with  toxic reactions.  Performed at [facility], [address] [place], [place] [address]    Salicylate Lvl 10/26/2022 <7.0 (L)  7.0 - 30.0 mg/dL Final   Performed at [facility], [address] [place], [place] [address]   Alcohol, Ethyl (B) 10/26/2022 <10  <10 mg/dL Final   Comment: (NOTE) Lowest detectable limit for serum alcohol is 10 mg/dL.  For medical purposes only. Performed at [facility], [address] [place], [place] [address]    SARS Coronavirus 2 by RT PCR 10/26/2022 NEGATIVE  NEGATIVE Final   Comment: (NOTE) SARS-CoV-2 target nucleic acids are NOT DETECTED.  The SARS-CoV-2 RNA is generally detectable in upper respiratory specimens during the acute phase of infection. The lowest concentration of SARS-CoV-2 viral copies this assay can detect is 138 copies/mL. A negative result does not preclude SARS-Cov-2 infection and should not be used as the sole basis for  treatment or other patient management decisions. A negative result may occur with  improper specimen collection/handling, submission of specimen other than nasopharyngeal swab, presence of viral mutation(s) within the areas targeted by this assay, and inadequate number of viral copies(<138 copies/mL). A negative result must be combined with clinical observations, patient history, and epidemiological information. The expected result is Negative.  Fact Sheet for Patients:  BloggerCourse.com  Fact Sheet for Healthcare Providers:  SeriousBroker.it  This test is no                          t yet approved or cleared by the [place] FDA and  has been authorized for detection and/or diagnosis of SARS-CoV-2 by FDA under an Emergency Use Authorization (EUA). This EUA will remain  in effect (meaning this test can be used) for the duration of the COVID-19 declaration under Section 564(b)(1) of the Act, 21 U.S.C.section 360bbb-3(b)(1), unless the authorization is terminated  or revoked sooner.       Influenza A by PCR 10/26/2022 NEGATIVE  NEGATIVE Final   Influenza B by PCR 10/26/2022 NEGATIVE  NEGATIVE Final   Comment: (NOTE) The Xpert Xpress SARS-CoV-2/FLU/RSV plus assay is intended as an aid in the diagnosis of influenza from Nasopharyngeal swab specimens and should not be used as a sole basis for treatment. Nasal washings and aspirates are unacceptable for Xpert Xpress SARS-CoV-2/FLU/RSV testing.  Fact Sheet for Patients: BloggerCourse.com  Fact Sheet for Healthcare Providers: SeriousBroker.it  This test is not yet approved or cleared by the [place] FDA and has been  authorized for detection and/or diagnosis of SARS-CoV-2 by FDA under an Emergency Use Authorization (EUA). This EUA will remain in effect (meaning this test can be used) for the duration of the COVID-19  declaration under Section 564(b)(1) of the Act, 21 U.S.C. section 360bbb-3(b)(1), unless the authorization is terminated or revoked.     Resp Syncytial Virus by PCR 10/26/2022 NEGATIVE  NEGATIVE Final   Comment: (NOTE) Fact Sheet for Patients: BloggerCourse.com  Fact Sheet for Healthcare Providers: SeriousBroker.it  This test is not yet approved or cleared by the [place] FDA and has been authorized for detection and/or diagnosis of SARS-CoV-2 by FDA under an Emergency Use Authorization (EUA). This EUA will remain in effect (meaning this test can be used) for the duration of the COVID-19 declaration under Section 564(b)(1) of the Act, 21 U.S.C. section 360bbb-3(b)(1), unless the authorization is terminated or revoked.  Performed at [facility], [address] [place], [place] [address]    Troponin I (High Sensitivity) 10/26/2022 5  <18 ng/L Final   Comment: (NOTE) Elevated high sensitivity troponin I (hsTnI) values and significant  changes across serial measurements may suggest ACS but many other  chronic and acute conditions are known to elevate hsTnI results.  Refer to the "Links" section for chest pain algorithms and additional  guidance. Performed at [facility], [address] [place], [place] [address]    Tricyclic, Ur Screen 10/26/2022 NONE DETECTED  NONE DETECTED Final   Amphetamines, Ur Screen 10/26/2022 NONE DETECTED  NONE DETECTED Final   MDMA (Ecstasy)Ur Screen 10/26/2022 NONE DETECTED  NONE DETECTED Final   Cocaine Metabolite,Ur [place] 10/26/2022 POSITIVE (A)  NONE DETECTED Final   Opiate, Ur Screen 10/26/2022 NONE DETECTED  NONE DETECTED Final   Phencyclidine (PCP) Ur S 10/26/2022 NONE DETECTED  NONE DETECTED Final   Cannabinoid 50 Ng, Ur [place] 10/26/2022 NONE DETECTED  NONE DETECTED Final   Barbiturates, Ur Screen 10/26/2022 NONE DETECTED  NONE DETECTED Final   Benzodiazepine, Ur Scrn  10/26/2022 POSITIVE (A)  NONE DETECTED Final   Methadone Scn, Ur 10/26/2022 NONE DETECTED  NONE DETECTED Final   Comment: (NOTE) Tricyclics + metabolites, urine    Cutoff 1000 ng/mL Amphetamines + metabolites, urine  Cutoff 1000 ng/mL MDMA (Ecstasy), urine              Cutoff 500 ng/mL Cocaine Metabolite, urine          Cutoff 300 ng/mL Opiate + metabolites, urine        Cutoff 300 ng/mL Phencyclidine (PCP), urine         Cutoff 25 ng/mL Cannabinoid, urine                 Cutoff 50 ng/mL Barbiturates + metabolites, urine  Cutoff 200 ng/mL Benzodiazepine, urine              Cutoff 200 ng/mL Methadone, urine                   Cutoff 300 ng/mL  The urine drug screen provides only a preliminary, unconfirmed analytical test result and should not be used for non-medical purposes. Clinical consideration and professional judgment should be applied to any positive drug screen result due to possible interfering substances. A more specific alternate chemical method must be used in order to obtain a confirmed analytical result. Gas chromatography / mass spectrometry (GC/MS) is the preferred confirm  atory method. Performed at [facility], [address] [place], [place] [address]   Admission on 10/22/2022, Discharged on 10/24/2022  Component Date Value Ref Range Status   Sodium 10/22/2022 139  135 - 145 mmol/L Final   Potassium 10/22/2022 3.5  3.5 - 5.1 mmol/L Final   Chloride 10/22/2022 104  98 - 111 mmol/L Final   CO2 10/22/2022 27  22 - 32 mmol/L Final   Glucose, Bld 10/22/2022 128 (H)  70 - 99 mg/dL Final   Glucose reference range applies only to samples taken after fasting for at least 8 hours.   BUN 10/22/2022 14  6 - 20 mg/dL Final   Creatinine, Ser 10/22/2022 0.76  0.61 - 1.24 mg/dL Final   Calcium 44/03/4740 8.9  8.9 - 10.3 mg/dL Final   Total Protein 59/56/3875 7.8  6.5 - 8.1 g/dL Final   Albumin 64/33/2951 4.4  3.5 - 5.0 g/dL Final   AST  88/41/6606 22  15 - 41 U/L Final   ALT 10/22/2022 19  0 - 44 U/L Final   Alkaline Phosphatase 10/22/2022 69  38 - 126 U/L Final   Total Bilirubin 10/22/2022 1.1  0.3 - 1.2 mg/dL Final   GFR, Estimated 10/22/2022 >60  >60 mL/min Final   Comment: (NOTE) Calculated using the CKD-EPI Creatinine Equation (2021)    Anion gap 10/22/2022 8  5 - 15 Final   Performed at [facility], [address] [place], [place] [address]   WBC 10/22/2022 11.9 (H)  4.0 - 10.5 K/uL Final   RBC 10/22/2022 4.85  4.22 - 5.81 MIL/uL Final   Hemoglobin 10/22/2022 14.4  13.0 - 17.0 g/dL Final   HCT 10/93/2355 44.3  39.0 - 52.0 % Final   MCV 10/22/2022 91.3  80.0 - 100.0 fL Final   MCH 10/22/2022 29.7  26.0 - 34.0 pg Final   MCHC 10/22/2022 32.5  30.0 - 36.0 g/dL Final   RDW 73/22/0254 13.9  11.5 - 15.5 % Final   Platelets 10/22/2022 279  150 - 400 K/uL Final   nRBC 10/22/2022 0.0  0.0 - 0.2 % Final   Neutrophils Relative % 10/22/2022 72  % Final   Neutro Abs 10/22/2022 8.5 (H)  1.7 - 7.7 K/uL Final   Lymphocytes Relative 10/22/2022 14  % Final   Lymphs Abs 10/22/2022 1.7  0.7 - 4.0 K/uL Final   Monocytes Relative 10/22/2022 8  % Final   Monocytes Absolute 10/22/2022 1.0  0.1 - 1.0 K/uL Final   Eosinophils Relative 10/22/2022 5  % Final   Eosinophils Absolute 10/22/2022 0.7 (H)  0.0 - 0.5 K/uL Final   Basophils Relative 10/22/2022 1  % Final   Basophils Absolute 10/22/2022 0.1  0.0 - 0.1 K/uL Final   Immature Granulocytes 10/22/2022 0  % Final   Abs Immature Granulocytes 10/22/2022 0.04  0.00 - 0.07 K/uL Final   Performed at [facility], [address] [place], [place] [address]   Troponin I (High Sensitivity) 10/22/2022 5  <18 ng/L Final   Comment: (NOTE) Elevated high sensitivity troponin I (hsTnI) values and significant  changes across serial measurements may suggest ACS but many other  chronic and acute conditions are known to elevate hsTnI results.  Refer to the "Links" section for  chest pain algorithms and additional  guidance. Performed at [facility], [address] [place], [place] [address]    Troponin I (High Sensitivity) 10/22/2022 5  <18 ng/L Final   Comment: (NOTE)  Elevated high sensitivity troponin I (hsTnI) values and significant  changes across serial measurements may suggest ACS but many other  chronic and acute conditions are known to elevate hsTnI results.  Refer to the "Links" section for chest pain algorithms and additional  guidance. Performed at [facility], [address] [place], [place] [address]    pH, Ven 10/22/2022 7.34  7.25 - 7.43 Final   pCO2, Ven 10/22/2022 57  44 - 60 mmHg Final   pO2, Ven 10/22/2022 37  32 - 45 mmHg Final   Bicarbonate 10/22/2022 30.8 (H)  20.0 - 28.0 mmol/L Final   Acid-Base Excess 10/22/2022 3.5 (H)  0.0 - 2.0 mmol/L Final   O2 Saturation 10/22/2022 54.4  % Final   Patient temperature 10/22/2022 37.0   Final   Collection site 10/22/2022 VEIN   Final   Performed at [facility], [address] [place], [place] [address]   Glucose-Capillary 10/22/2022 198 (H)  70 - 99 mg/dL Final   Glucose reference range applies only to samples taken after fasting for at least 8 hours.   O2 Content 10/22/2022 4.0  L/min Final   Delivery systems 10/22/2022 NASAL CANNULA   Final   pH, Arterial 10/22/2022 7.41  7.35 - 7.45 Final   pCO2 arterial 10/22/2022 40  32 - 48 mmHg Final   pO2, Arterial 10/22/2022 124 (H)  83 - 108 mmHg Final   Bicarbonate 10/22/2022 25.4  20.0 - 28.0 mmol/L Final   Acid-Base Excess 10/22/2022 0.7  0.0 - 2.0 mmol/L Final   O2 Saturation 10/22/2022 99.3  % Final   Patient temperature 10/22/2022 37.0   Final   Collection site 10/22/2022 RIGHT RADIAL   Final   Allens test (pass/fail) 10/22/2022 PASS  PASS Final   Performed at [facility], [address], [place], [place] [address]  Admission on 09/24/2022, Discharged on 09/24/2022  Component Date Value Ref  Range Status   Troponin I (High Sensitivity) 09/24/2022 5  <18 ng/L Final   Comment: (NOTE) Elevated high sensitivity troponin I (hsTnI) values and significant  changes across serial measurements may suggest ACS but many other  chronic and acute conditions are known to elevate hsTnI results.  Refer to the "Links" section for chest pain algorithms and additional  guidance. Performed at [facility], [address] [place], [place] [address]    SARS Coronavirus 2 by RT PCR 09/24/2022 NEGATIVE  NEGATIVE Final   Influenza A by PCR 09/24/2022 NEGATIVE  NEGATIVE Final   Influenza B by PCR 09/24/2022 NEGATIVE  NEGATIVE Final   Comment: (NOTE) The Xpert Xpress SARS-CoV-2/FLU/RSV plus assay is intended as an aid in the diagnosis of influenza from Nasopharyngeal swab specimens and should not be used as a sole basis for treatment. Nasal washings and aspirates are unacceptable for Xpert Xpress SARS-CoV-2/FLU/RSV testing.  Fact Sheet for Patients: BloggerCourse.com  Fact Sheet for Healthcare Providers: SeriousBroker.it  This test is not yet approved or cleared by the [place] FDA and has been authorized for detection and/or diagnosis of SARS-CoV-2 by FDA under an Emergency Use Authorization (EUA). This EUA will remain in effect (meaning this test can be used) for the duration of the COVID-19 declaration under Section 564(b)(1) of the Act, 21 U.S.C. section 360bbb-3(b)(1), unless the authorization is terminated or revoked.     Resp Syncytial Virus by PCR 09/24/2022 NEGATIVE  NEGATIVE Final   Comment: (NOTE) Fact Sheet for Patients: BloggerCourse.com  Fact Sheet for Healthcare Providers: SeriousBroker.it  This test is not yet approved or cleared by the [place] and has been authorized for detection and/or diagnosis of SARS-CoV-2 by FDA under an Emergency Use  Authorization (EUA). This EUA will remain in effect (meaning this test can be used) for the duration of the COVID-19 declaration under Section 564(b)(1) of the Act, 21 U.S.C. section 360bbb-3(b)(1), unless the authorization is terminated or revoked.  Performed at [id number], [address] [place], [place] [address]    Sodium 09/24/2022 139  135 - 145 mmol/L Final   Potassium 09/24/2022 4.0  3.5 - 5.1 mmol/L Final   HEMOLYSIS AT THIS LEVEL MAY AFFECT RESULT   Chloride 09/24/2022 101  98 - 111 mmol/L Final   CO2 09/24/2022 27  22 - 32 mmol/L Final   Glucose, Bld 09/24/2022 121 (H)  70 - 99 mg/dL Final   Glucose reference range applies only to samples taken after fasting for at least 8 hours.   BUN 09/24/2022 10  6 - 20 mg/dL Final   Creatinine, Ser 09/24/2022 0.98  0.61 - 1.24 mg/dL Final   Calcium 32/44/0102 9.1  8.9 - 10.3 mg/dL Final   GFR, Estimated 09/24/2022 >60  >60 mL/min Final   Comment: (NOTE) Calculated using the CKD-EPI Creatinine Equation (2021)    Anion gap 09/24/2022 11  5 - 15 Final   Performed at [facility], [address], [place], [place] [address]   WBC 09/24/2022 8.0  4.0 - 10.5 K/uL Final   RBC 09/24/2022 4.78  4.22 - 5.81 MIL/uL Final   Hemoglobin 09/24/2022 14.3  13.0 - 17.0 g/dL Final   HCT 64/40/3474 42.7  39.0 - 52.0 % Final   MCV 09/24/2022 89.3  80.0 - 100.0 fL Final   MCH 09/24/2022 29.9  26.0 - 34.0 pg Final   MCHC 09/24/2022 33.5  30.0 - 36.0 g/dL Final   RDW 25/95/6387 14.0  11.5 - 15.5 % Final   Platelets 09/24/2022 333  150 - 400 K/uL Final   nRBC 09/24/2022 0.0  0.0 - 0.2 % Final   Neutrophils Relative % 09/24/2022 61  % Final   Neutro Abs 09/24/2022 4.8  1.7 - 7.7 K/uL Final   Lymphocytes Relative 09/24/2022 27  % Final   Lymphs Abs 09/24/2022 2.2  0.7 - 4.0 K/uL Final   Monocytes Relative 09/24/2022 8  % Final   Monocytes Absolute 09/24/2022 0.7  0.1 - 1.0 K/uL Final   Eosinophils Relative 09/24/2022 3  % Final    Eosinophils Absolute 09/24/2022 0.3  0.0 - 0.5 K/uL Final   Basophils Relative 09/24/2022 1  % Final   Basophils Absolute 09/24/2022 0.1  0.0 - 0.1 K/uL Final   Immature Granulocytes 09/24/2022 0  % Final   Abs Immature Granulocytes 09/24/2022 0.02  0.00 - 0.07 K/uL Final   Performed at [name], [address], [place], [place] [address]  Office Visit on 07/12/2022  Component Date Value Ref Range Status   Hemoglobin A1C 07/12/2022 5.9 (A)  4.0 - 5.6 % Final  Admission on 06/30/2022, Discharged on 07/01/2022  Component Date Value Ref Range Status   WBC 06/30/2022 8.9  4.0 - 10.5 K/uL Final   RBC 06/30/2022 4.26  4.22 - 5.81 MIL/uL Final   Hemoglobin 06/30/2022 12.7 (L)  13.0 - 17.0 g/dL Final   HCT 29/51/8841 39.6  39.0 - 52.0 % Final   MCV 06/30/2022 93.0  80.0 - 100.0 fL Final   MCH  06/30/2022 29.8  26.0 - 34.0 pg Final   MCHC 06/30/2022 32.1  30.0 - 36.0 g/dL Final   RDW 08/65/7846 14.8  11.5 - 15.5 % Final   Platelets 06/30/2022 234  150 - 400 K/uL Final   nRBC 06/30/2022 0.0  0.0 - 0.2 % Final   Performed at [facility], [address], [place], [place] [address]   Sodium 06/30/2022 140  135 - 145 mmol/L Final   Potassium 06/30/2022 3.8  3.5 - 5.1 mmol/L Final   Chloride 06/30/2022 107  98 - 111 mmol/L Final   CO2 06/30/2022 27  22 - 32 mmol/L Final   Glucose, Bld 06/30/2022 108 (H)  70 - 99 mg/dL Final   Glucose reference range applies only to samples taken after fasting for at least 8 hours.   BUN 06/30/2022 28 (H)  6 - 20 mg/dL Final   Creatinine, Ser 06/30/2022 1.18  0.61 - 1.24 mg/dL Final   Calcium 28/41/3244 9.0  8.9 - 10.3 mg/dL Final   GFR, Estimated 06/30/2022 >60  >60 mL/min Final   Comment: (NOTE) Calculated using the CKD-EPI Creatinine Equation (2021)    Anion gap 06/30/2022 6  5 - 15 Final   Performed at [facility], [address], [place], [place] [address]   Sodium 06/30/2022 140  135 - 145 mmol/L Final    Potassium 06/30/2022 3.7  3.5 - 5.1 mmol/L Final   Chloride 06/30/2022 105  98 - 111 mmol/L Final   BUN 06/30/2022 28 (H)  6 - 20 mg/dL Final   Creatinine, Ser 06/30/2022 1.10  0.61 - 1.24 mg/dL Final   Glucose, Bld 25/36/6440 147 (H)  70 - 99 mg/dL Final   Glucose reference range applies only to samples taken after fasting for at least 8 hours.   Calcium, Ion 06/30/2022 1.24  1.15 - 1.40 mmol/L Final   TCO2 06/30/2022 28  22 - 32 mmol/L Final   Hemoglobin 06/30/2022 12.6 (L)  13.0 - 17.0 g/dL Final   HCT 34/74/2595 37.0 (L)  39.0 - 52.0 % Final   B Natriuretic Peptide 06/30/2022 47.7  0.0 - 100.0 pg/mL Final   Performed at [facility], [address] [place], [place] [address]   Troponin I (High Sensitivity) 07/01/2022 4  <18 ng/L Final   Comment: (NOTE) Elevated high sensitivity troponin I (hsTnI) values and significant  changes across serial measurements may suggest ACS but many other  chronic and acute conditions are known to elevate hsTnI results.  Refer to the "Links" section for chest pain algorithms and additional  guidance. Performed at [facility], [address] [place], [place] [address]   There may be more visits with results that are not included.    Blood Alcohol level:  Lab Results  Component Value Date   ETH <10 12/02/2022   ETH <10 11/17/2022    Metabolic Disorder Labs: Lab Results  Component Value Date   HGBA1C 5.9 (A) 07/12/2022   MPG 117 02/02/2009   No results found for: "PROLACTIN" Lab Results  Component Value Date   CHOL 203 (H) 10/31/2022   TRIG 196 (H) 10/31/2022   HDL 84 10/31/2022   CHOLHDL 2.4 10/31/2022   VLDL 39 10/31/2022   LDLCALC 80 10/31/2022   LDLCALC 63 10/08/2014    Therapeutic Lab Levels: No results found for: "LITHIUM" No results found for: "VALPROATE" No results found for: "CBMZ"  Physical Findings   AIMS  Flowsheet Row Admission (Discharged) from 02/02/2016 [facility] Admission (Discharged) from 03/22/2015 in [facility] Admission (Discharged) from 10/05/2014 in [facility] INPATIENT BEHAVIORAL MEDICINE Ad[facility] (Discharged[facility]  AIMS Total Score 0 0 0 0      AUDIT    Flowsheet Row Admission (Discharged) from 11/17/2022 in [facility] INPATIENT BEHAVIORAL MEDICINE Admission (Discharged) from 10/27/2022 in [facility] INPATIENT BEHAVIORAL MEDICINE Admission (Discharged) from 03/22/2015 in [facility] Admission (Discharged) from 10/05/2014 in [facility] INPATIENT BEHAVIORAL MEDICINE Admission (Discharged) from 10/01/2014 in [facility] UN[facility]  Alcohol Use Disorder Identification Test Final Score (AUDIT) 14 14 25 18  34      Flowsheet Row ED from 12/03/2022 in [facility] ED from 12/02/2022 in [facility] Emergency Department at [facility] Admission (Discharged) from 11/17/2022 in [facility] INPATIENT BEHAVIORAL MEDICINE  C-SSRS RISK CATEGORY No Risk Error: Q3, 4, or 5 should not be populated when Q2 is No Moderate Risk        Musculoskeletal  Strength & Muscle Tone: within normal limits Gait & Station: normal Patient leans: N/A  Psychiatric Specialty Exam  Presentation  General Appearance:  Appropriate for Environment; Casual  Eye Contact: Fair  Speech: Clear and Coherent; Normal Rate  Speech Volume: Normal  Handedness: Right   Mood and Affect  Mood: Depressed; Anxious  Affect: Congruent; Blunt   Thought Process  Thought Processes: Coherent; Goal Directed; Linear  Descriptions of Associations:Intact  Orientation:Full (Time, Place and Person)  Thought Content:Logical  Diagnosis of Schizophrenia or Schizoaffective disorder in past: No  Duration of Psychotic Symptoms: Greater than six months   Hallucinations:Hallucinations: None  Ideas of Reference:None  Suicidal  Thoughts:Suicidal Thoughts: No  Homicidal Thoughts:Homicidal Thoughts: No   Sensorium  Memory: Immediate Good; Recent Good  Judgment: Fair  Insight: Good   Executive Functions  Concentration: Good  Attention Span: Good  Recall: Good  Fund of Knowledge: Good  Language: Good   Psychomotor Activity  Psychomotor Activity: Psychomotor Activity: Normal Extrapyramidal Side Effects (EPS): Other (comment) (Not noted) AIMS Completed?: No   Assets  Assets: Communication Skills; Desire for Improvement; Financial Resources/Insurance; Resilience   Sleep  Sleep: Sleep: Fair Number of Hours of Sleep: 8   Nutritional Assessment (For OBS and FBC admissions only) Has the patient had a weight loss or gain of 10 pounds or more in the last 3 months?: Yes Has the patient had a decrease in food intake/or appetite?: No Does the patient have dental problems?: No Does the patient have eating habits or behaviors that may be indicators of an eating disorder including binging or inducing vomiting?: No Has the patient recently lost weight without trying?: 1 Has the patient been eating poorly because of a decreased appetite?: 0 Malnutrition Screening Tool Score: 1    Physical Exam  Physical Exam Vitals and nursing note reviewed.  Cardiovascular:     Pulses: Normal pulses.     Heart sounds: Normal heart sounds.  Neurological:     General: No focal deficit present.     Mental Status: He is oriented to person, place, and time.  Psychiatric:        Mood and Affect: Mood normal.        Behavior: Behavior normal.    Review of Systems  Psychiatric/Behavioral:  Positive for substance abuse. Negative for depression and hallucinations. The patient is nervous/anxious.   All other systems reviewed and are negative.  Blood pressure  125/72, pulse 70, temperature 97.7 F (36.5 C), temperature source Oral, resp. rate 18, SpO2 99%. There is no height or weight on file to calculate  BMI.  Treatment Plan Summary: Daily contact with patient to assess and evaluate symptoms and progress in treatment and Medication management  Continue with current treatment plan on 12/04/2022 as listed below except were noted  Continue Ativan taper CIWA protocol Restarted Prozac 20 mg daily Restarted Risperdal 1 mg nightly Continue trazodone 50 mg nightly as needed   CSW to start working on discharge disposition Patient encouraged to participate in therapeutic milieu  Oneta Rack, NP 12/04/2022 12:26 PM

## 2022-12-04 NOTE — ED Notes (Signed)
 Pt was provided lunch

## 2022-12-04 NOTE — ED Notes (Signed)
Pt was provided dinner.

## 2022-12-04 NOTE — ED Notes (Signed)
Pt in the bedroom composed and sleeping. NAD. Respirations even and unlabored. No complaints at the moment. Will keep monitoring for safety.

## 2022-12-04 NOTE — Group Note (Signed)
Group Topic: Recovery Basics  Group Date: 12/04/2022 Start Time: 1600 End Time: 1700 Facilitators: Vernie Shanks, RN  Department: [facility]  Number of Participants: 5  Group Focus: chemical dependency education Treatment Modality:  Psychoeducation Interventions utilized were patient education Purpose: increase insight  Name: Franklin [name] Date of Birth: 09/27/1970  MR: [id number]    Level of Participation:  Quality of Participation:  Pt did not attend     Patients Problems:  Patient Active Problem List   Diagnosis Date Noted   Cocaine abuse with cocaine-induced mood disorder (HCC) 12/03/2022   Major depressive disorder, 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

## 2022-12-04 NOTE — Group Note (Signed)
Group Topic: Identity and Relationships  Group Date: 12/04/2022 Start Time: 1000 End Time: 1050 Facilitators: Londell Moh, NT  Department: [facility]  Number of Participants: 3  Group Focus: check in, clarity of thought, feeling awareness/expression, and forgiveness Treatment Modality:  Psychoeducation Interventions utilized were patient education Purpose: express feelings and increase insight  Name: Franklin [name] Date of Birth: [birth year]  MR: [id number]    Level of Participation: Pt did not attend group. 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

## 2022-12-04 NOTE — ED Notes (Signed)
Pt is awake and alert when out of his room.  Affect flat with depressed mood. Pt denies SI, HI or AVH.  Pt remains in his room except for meals and needs. Declined to attend group.Denies S/S of withdrawal.  Staff will cont to monitor for safety.

## 2022-12-05 DIAGNOSIS — F32A Depression, unspecified: Secondary | ICD-10-CM | POA: Diagnosis not present

## 2022-12-05 DIAGNOSIS — F1414 Cocaine abuse with cocaine-induced mood disorder: Secondary | ICD-10-CM | POA: Diagnosis not present

## 2022-12-05 NOTE — ED Notes (Signed)
Patient in the bedroom calm and sleeping. NAD Respirations even and unlabored. Will continue monitor for safety

## 2022-12-05 NOTE — Group Note (Signed)
Group Topic: Relapse and Recovery  Group Date: 12/05/2022 Start Time: 1340 End Time: 1345 Facilitators: Shaundrea Carrigg, Jacklynn Barnacle, RN  Department: [facility]  Number of Participants: 1  Group Focus: coping skills Treatment Modality:  Individual Therapy Interventions utilized were assignment and exploration Purpose: enhance coping skills and relapse prevention strategies  Name: Franklin [name] Date of Birth: 1970/01/12  MR: [id number]    Level of Participation: active Quality of Participation: attentive, engaged, and initiates communication Interactions with others: assertive and interactive Mood/Affect: appropriate Triggers (if applicable): self identified on worksheet: "homeless, no money, cold weather, hungry, no friends, no interest outside drugs & alcohol" Cognition: coherent/clear Progress: Gaining insight Response: Patient initially reported "gotta find some" under coping skills, with assistance was able to identify "movies, cooking, and music" as additional coping skills Plan: patient will be encouraged to continue exploration of coping skills, exploration of hobbies or other interests outside of substance use, and use of coping skills to prevent relapse  Patients Problems:  Patient Active Problem List   Diagnosis Date Noted   Cocaine abuse with cocaine-induced mood disorder (HCC) 12/03/2022   Major depressive disorder, 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

## 2022-12-05 NOTE — ED Provider Notes (Signed)
Behavioral Health Progress Note  Date and Time: 12/05/2022 11:38 AM Name: Franklin [name] MRN:  [id number]  Subjective:  Franklin [name] stated " I am good."   Franklin [name] was seen and evaluated face-to-face by this provider.  He presents flat, guarded but pleasant.  Denying suicidal or homicidal ideations.  He reports he has been taking his medications as indicated.  Denying any medication side effects.  Patient requested to complete a PHQ-9 for day 3 follow-up." I  already completed this" Per nursing staff, from is at bedside,incomplete.   Denied alcohol cravings/urges.  Denies any alcohol withdrawal symptoms.  Patient reports he is eager to get back to sleep to catch up on his rest. Franklin [name] is minimal engagement throughout this assessment.  States he has plans to follow-up with residential treatment as he has already set up steps to transfer early next week.  Support, encouragement reassurance was provided.  Substance-induced mood disorder: Cocaine abuse/misuse   Continue Ativan taper per CIWA protocol Restarted Prozac 20 mg daily Restarted Risperdal 1 mg nightly Continue trazodone 50 mg nightly as needed  Franklin [name] observed sitting in day room eating breakfast.  He is pleasant, cooperative.  Mood was flat and depressed affect.  Speaking clear and  moderate tone with normal pace.  Patient has fair eye contact.  Thoughts process is clear and coherent and relevant.  No indication that patient is currently responding to internal or external stimuli. Franklin [name] answered all questions appropriately.  Staff to continue to monitor for safety.   Diagnosis:  Final diagnoses:  Cocaine abuse with cocaine-induced mood disorder (HCC)    Total Time spent with patient: 15 minutes  Past Psychiatric History:  Past Medical History:  Family History:  Family Psychiatric  History:  Social History:   Additional Social History:        Sleep: Good  Appetite:  Good  Current Medications:  Current  Facility-Administered Medications  Medication Dose Route Frequency Provider Last Rate Last Admin   acetaminophen (TYLENOL) tablet 650 mg  650 mg Oral Q6H PRN Nwoko, Uchenna E, PA   650 mg at 12/03/22 2002   alum & mag hydroxide-simeth (MAALOX/MYLANTA) 200-200-20 MG/5ML suspension 30 mL  30 mL Oral Q4H PRN Nwoko, Uchenna E, PA       diphenhydrAMINE (BENADRYL) capsule 50 mg  50 mg Oral Q6H PRN Nwoko, Uchenna E, PA       Or   diphenhydrAMINE (BENADRYL) injection 50 mg  50 mg Intramuscular Q6H PRN Nwoko, Uchenna E, PA       FLUoxetine (PROZAC) capsule 20 mg  20 mg Oral Daily Oneta Rack, NP   20 mg at 12/05/22 0943   haloperidol (HALDOL) tablet 5 mg  5 mg Oral Q6H PRN Nwoko, Uchenna E, PA       Or   haloperidol lactate (HALDOL) injection 5 mg  5 mg Intramuscular Q6H PRN Nwoko, Uchenna E, PA       hydrOXYzine (ATARAX) tablet 25 mg  25 mg Oral TID PRN Nwoko, Uchenna E, PA       hydrOXYzine (ATARAX) tablet 25 mg  25 mg Oral Q6H PRN Nwoko, Uchenna E, PA       loperamide (IMODIUM) capsule 2-4 mg  2-4 mg Oral PRN Nwoko, Uchenna E, PA       LORazepam (ATIVAN) tablet 2 mg  2 mg Oral Q6H PRN Nwoko, Uchenna E, PA       Or   LORazepam (ATIVAN) injection 2 mg  2 mg Intramuscular Q6H PRN Nwoko,  Tommas Olp, PA       LORazepam (ATIVAN) tablet 1 mg  1 mg Oral TID Nwoko, Uchenna E, PA   1 mg at 12/05/22 5284   Followed by   [name] ON 12/06/2022] LORazepam (ATIVAN) tablet 1 mg  1 mg Oral BID Nwoko, Uchenna E, PA       Followed by   [name] ON 12/07/2022] LORazepam (ATIVAN) tablet 1 mg  1 mg Oral Daily Nwoko, Uchenna E, PA       magnesium hydroxide (MILK OF MAGNESIA) suspension 30 mL  30 mL Oral Daily PRN Nwoko, Uchenna E, PA       multivitamin with minerals tablet 1 tablet  1 tablet Oral Daily Nwoko, Uchenna E, PA   1 tablet at 12/05/22 0943   ondansetron (ZOFRAN-ODT) disintegrating tablet 4 mg  4 mg Oral Q6H PRN Nwoko, Uchenna E, PA       risperiDONE (RISPERDAL) tablet 1 mg  1 mg Oral QHS Oneta Rack, NP   1  mg at 12/04/22 2123   thiamine (VITAMIN B1) tablet 100 mg  100 mg Oral Daily Nwoko, Uchenna E, PA   100 mg at 12/05/22 0943   traZODone (DESYREL) tablet 50 mg  50 mg Oral QHS PRN Nwoko, Uchenna E, PA   50 mg at 12/04/22 2123   Current Outpatient Medications  Medication Sig Dispense Refill   benztropine (COGENTIN) 1 MG tablet Take 1 tablet (1 mg total) by mouth daily for 10 days. 10 tablet 0   carvedilol (COREG) 3.125 MG tablet Take 1 tablet (3.125 mg total) by mouth 2 (two) times daily with a meal. 10 tablet 0   FLUoxetine (PROZAC) 20 MG capsule Take 1 capsule (20 mg total) by mouth daily for 10 days. 10 capsule 0   mometasone-formoterol (DULERA) 200-5 MCG/ACT AERO Inhale 2 puffs into the lungs 2 (two) times daily for 10 days. 1 each 0   risperiDONE (RISPERDAL) 1 MG tablet Take 1 tablet (1 mg total) by mouth at bedtime for 10 days. 10 tablet 0   topiramate (TOPAMAX) 50 MG tablet Take 1 tablet (50 mg total) by mouth daily for 10 days. 10 tablet 0   traZODone (DESYREL) 100 MG tablet Take 1 tablet (100 mg total) by mouth at bedtime as needed for up to 10 days for sleep. 10 tablet 0    Labs  Lab Results:  Admission on 12/02/2022, Discharged on 12/03/2022  Component Date Value Ref Range Status   Sodium 12/02/2022 139  135 - 145 mmol/L Final   Potassium 12/02/2022 3.9  3.5 - 5.1 mmol/L Final   Chloride 12/02/2022 103  98 - 111 mmol/L Final   CO2 12/02/2022 26  22 - 32 mmol/L Final   Glucose, Bld 12/02/2022 162 (H)  70 - 99 mg/dL Final   Glucose reference range applies only to samples taken after fasting for at least 8 hours.   BUN 12/02/2022 16  6 - 20 mg/dL Final   Creatinine, Ser 12/02/2022 1.11  0.61 - 1.24 mg/dL Final   Calcium 13/24/4010 9.0  8.9 - 10.3 mg/dL Final   Total Protein 27/25/3664 6.8  6.5 - 8.1 g/dL Final   Albumin 40/34/7425 4.0  3.5 - 5.0 g/dL Final   AST 95/63/8756 29  15 - 41 U/L Final   ALT 12/02/2022 19  0 - 44 U/L Final   Alkaline Phosphatase 12/02/2022 85  38 - 126  U/L Final   Total Bilirubin 12/02/2022 0.8  <1.2 mg/dL Final  GFR, Estimated 12/02/2022 >60  >60 mL/min Final   Comment: (NOTE) Calculated using the CKD-EPI Creatinine Equation (2021)    Anion gap 12/02/2022 10  5 - 15 Final   Performed at [facility], [address] [place], [place] [address]   WBC 12/02/2022 5.9  4.0 - 10.5 K/uL Final   RBC 12/02/2022 4.21 (L)  4.22 - 5.81 MIL/uL Final   Hemoglobin 12/02/2022 12.8 (L)  13.0 - 17.0 g/dL Final   HCT 69/62/9528 38.5 (L)  39.0 - 52.0 % Final   MCV 12/02/2022 91.4  80.0 - 100.0 fL Final   MCH 12/02/2022 30.4  26.0 - 34.0 pg Final   MCHC 12/02/2022 33.2  30.0 - 36.0 g/dL Final   RDW 41/32/4401 14.4  11.5 - 15.5 % Final   Platelets 12/02/2022 311  150 - 400 K/uL Final   nRBC 12/02/2022 0.0  0.0 - 0.2 % Final   Performed at [facility], [address] [place], [place] [address]   Tricyclic, Ur Screen 12/02/2022 NONE DETECTED  NONE DETECTED Final   Amphetamines, Ur Screen 12/02/2022 NONE DETECTED  NONE DETECTED Final   MDMA (Ecstasy)Ur Screen 12/02/2022 NONE DETECTED  NONE DETECTED Final   Cocaine Metabolite,Ur [place] 12/02/2022 POSITIVE (A)  NONE DETECTED Final   Opiate, Ur Screen 12/02/2022 NONE DETECTED  NONE DETECTED Final   Phencyclidine (PCP) Ur S 12/02/2022 NONE DETECTED  NONE DETECTED Final   Cannabinoid 50 Ng, Ur [place] 12/02/2022 NONE DETECTED  NONE DETECTED Final   Barbiturates, Ur Screen 12/02/2022 NONE DETECTED  NONE DETECTED Final   Benzodiazepine, Ur Scrn 12/02/2022 POSITIVE (A)  NONE DETECTED Final   Methadone Scn, Ur 12/02/2022 NONE DETECTED  NONE DETECTED Final   Comment: (NOTE) Tricyclics + metabolites, urine    Cutoff 1000 ng/mL Amphetamines + metabolites, urine  Cutoff 1000 ng/mL MDMA (Ecstasy), urine              Cutoff 500 ng/mL Cocaine Metabolite, urine          Cutoff 300 ng/mL Opiate + metabolites, urine        Cutoff 300 ng/mL Phencyclidine (PCP), urine         Cutoff 25 ng/mL Cannabinoid,  urine                 Cutoff 50 ng/mL Barbiturates + metabolites, urine  Cutoff 200 ng/mL Benzodiazepine, urine              Cutoff 200 ng/mL Methadone, urine                   Cutoff 300 ng/mL  The urine drug screen provides only a preliminary, unconfirmed analytical test result and should not be used for non-medical purposes. Clinical consideration and professional judgment should be applied to any positive drug screen result due to possible interfering substances. A more specific alternate chemical method must be used in order to obtain a confirmed analytical result. Gas chromatography / mass spectrometry (GC/MS) is the preferred confirm                          atory method. Performed at [facility], [address] [place], [place] [address]    Acetaminophen (Tylenol), Serum 12/02/2022 <10 (L)  10 - 30 ug/mL Final   Comment: (NOTE) Therapeutic concentrations vary significantly. A range of 10-30 ug/mL  may be an effective concentration for many patients. However, some  are best treated at  concentrations outside of this range. Acetaminophen concentrations >150 ug/mL at 4 hours after ingestion  and >50 ug/mL at 12 hours after ingestion are often associated with  toxic reactions.  Performed at [facility], [address] [place], [place] [address]    Alcohol, Ethyl (B) 12/02/2022 <10  <10 mg/dL Final   Comment: (NOTE) Lowest detectable limit for serum alcohol is 10 mg/dL.  For medical purposes only. Performed at [facility], [address] [place], [place] [address]    Salicylate Lvl 12/02/2022 <7.0 (L)  7.0 - 30.0 mg/dL Final   Performed at [facility], [address] [place], [place] [address]  Admission on 11/17/2022, Discharged on 11/17/2022  Component Date Value Ref Range Status   Sodium 11/17/2022 139  135 - 145 mmol/L Final   Potassium 11/17/2022 4.1  3.5 - 5.1 mmol/L Final   Chloride 11/17/2022 100  98 - 111 mmol/L  Final   CO2 11/17/2022 29  22 - 32 mmol/L Final   Glucose, Bld 11/17/2022 90  70 - 99 mg/dL Final   Glucose reference range applies only to samples taken after fasting for at least 8 hours.   BUN 11/17/2022 10  6 - 20 mg/dL Final   Creatinine, Ser 11/17/2022 0.68  0.61 - 1.24 mg/dL Final   Calcium 36/64/4034 9.0  8.9 - 10.3 mg/dL Final   Total Protein 74/25/9563 8.1  6.5 - 8.1 g/dL Final   Albumin 87/56/4332 4.6  3.5 - 5.0 g/dL Final   AST 95/18/8416 22  15 - 41 U/L Final   ALT 11/17/2022 20  0 - 44 U/L Final   Alkaline Phosphatase 11/17/2022 61  38 - 126 U/L Final   Total Bilirubin 11/17/2022 1.1  <1.2 mg/dL Final   GFR, Estimated 11/17/2022 >60  >60 mL/min Final   Comment: (NOTE) Calculated using the CKD-EPI Creatinine Equation (2021)    Anion gap 11/17/2022 10  5 - 15 Final   Performed at [facility], [address] [place], [place] [address]   Alcohol, Ethyl (B) 11/17/2022 <10  <10 mg/dL Final   Comment: (NOTE) Lowest detectable limit for serum alcohol is 10 mg/dL.  For medical purposes only. Performed at [facility], [address] [place], [place] [address]    Salicylate Lvl 11/17/2022 <7.0 (L)  7.0 - 30.0 mg/dL Final   Performed at [facility], [address] [place], [place] [address]   Acetaminophen (Tylenol), Serum 11/17/2022 <10 (L)  10 - 30 ug/mL Final   Comment: (NOTE) Therapeutic concentrations vary significantly. A range of 10-30 ug/mL  may be an effective concentration for many patients. However, some  are best treated at concentrations outside of this range. Acetaminophen concentrations >150 ug/mL at 4 hours after ingestion  and >50 ug/mL at 12 hours after ingestion are often associated with  toxic reactions.  Performed at [facility], [address] [place], [place] [address]    WBC 11/17/2022 7.6  4.0 - 10.5 K/uL Final   RBC 11/17/2022 5.07  4.22 - 5.81 MIL/uL Final   Hemoglobin 11/17/2022 15.2  13.0 - 17.0  g/dL Final   HCT 02/54/2706 46.8  39.0 - 52.0 % Final   MCV 11/17/2022 92.3  80.0 - 100.0 fL Final   MCH 11/17/2022 30.0  26.0 - 34.0 pg Final   MCHC 11/17/2022 32.5  30.0 - 36.0 g/dL Final   RDW 23/76/2831 14.6  11.5 - 15.5 % Final   Platelets 11/17/2022 345  150 - 400 K/uL Final   nRBC 11/17/2022 0.0  0.0 - 0.2 % Final   Performed at [facility], [address] [place], [place] [address]   Tricyclic, Ur Screen 11/17/2022 NONE DETECTED  NONE DETECTED Final   Amphetamines, Ur Screen 11/17/2022 NONE DETECTED  NONE DETECTED Final   MDMA (Ecstasy)Ur Screen 11/17/2022 NONE DETECTED  NONE DETECTED Final   Cocaine Metabolite,Ur [place] 11/17/2022 POSITIVE (A)  NONE DETECTED Final   Opiate, Ur Screen 11/17/2022 NONE DETECTED  NONE DETECTED Final   Phencyclidine (PCP) Ur S 11/17/2022 NONE DETECTED  NONE DETECTED Final   Cannabinoid 50 Ng, Ur [place] 11/17/2022 POSITIVE (A)  NONE DETECTED Final   Barbiturates, Ur Screen 11/17/2022 NONE DETECTED  NONE DETECTED Final   Benzodiazepine, Ur Scrn 11/17/2022 POSITIVE (A)  NONE DETECTED Final   Methadone Scn, Ur 11/17/2022 NONE DETECTED  NONE DETECTED Final   Comment: (NOTE) Tricyclics + metabolites, urine    Cutoff 1000 ng/mL Amphetamines + metabolites, urine  Cutoff 1000 ng/mL MDMA (Ecstasy), urine              Cutoff 500 ng/mL Cocaine Metabolite, urine          Cutoff 300 ng/mL Opiate + metabolites, urine        Cutoff 300 ng/mL Phencyclidine (PCP), urine         Cutoff 25 ng/mL Cannabinoid, urine                 Cutoff 50 ng/mL Barbiturates + metabolites, urine  Cutoff 200 ng/mL Benzodiazepine, urine              Cutoff 200 ng/mL Methadone, urine                   Cutoff 300 ng/mL  The urine drug screen provides only a preliminary, unconfirmed analytical test result and should not be used for non-medical purposes. Clinical consideration and professional judgment should be applied to any positive drug screen result due to possible interfering  substances. A more specific alternate chemical method must be used in order to obtain a confirmed analytical result. Gas chromatography / mass spectrometry (GC/MS) is the preferred confirm                          atory method. Performed at [facility], [address] [place], [place] [address]   Admission on 11/13/2022, Discharged on 11/13/2022  Component Date Value Ref Range Status   Sodium 11/13/2022 141  135 - 145 mmol/L Final   Potassium 11/13/2022 4.0  3.5 - 5.1 mmol/L Final   Chloride 11/13/2022 108  98 - 111 mmol/L Final   CO2 11/13/2022 26  22 - 32 mmol/L Final   Glucose, Bld 11/13/2022 126 (H)  70 - 99 mg/dL Final   Glucose reference range applies only to samples taken after fasting for at least 8 hours.   BUN 11/13/2022 14  6 - 20 mg/dL Final   Creatinine, Ser 11/13/2022 0.91  0.61 - 1.24 mg/dL Final   Calcium 09/81/1914 9.1  8.9 - 10.3 mg/dL Final   Total Protein 78/29/5621 7.2  6.5 - 8.1 g/dL Final   Albumin 30/86/5784 4.1  3.5 - 5.0 g/dL Final   AST 69/62/9528 30  15 - 41 U/L Final   ALT 11/13/2022 23  0 - 44 U/L Final   Alkaline Phosphatase 11/13/2022 76  38 - 126 U/L Final   Total Bilirubin 11/13/2022 1.9 (  H)  <1.2 mg/dL Final   GFR, Estimated 11/13/2022 >60  >60 mL/min Final   Comment: (NOTE) Calculated using the CKD-EPI Creatinine Equation (2021)    Anion gap 11/13/2022 7  5 - 15 Final   Performed at [facility], [address], [place], [place] [address]   Alcohol, Ethyl (B) 11/13/2022 <10  <10 mg/dL Final   Comment: (NOTE) Lowest detectable limit for serum alcohol is 10 mg/dL.  For medical purposes only. Performed at [facility], [address] [place], [place] [address]    Salicylate Lvl 11/13/2022 <7.0 (L)  7.0 - 30.0 mg/dL Final   Performed at [facility], [address] [place], [place] [address]   Acetaminophen (Tylenol), Serum 11/13/2022 <10 (L)  10 - 30 ug/mL Final   Comment:  (NOTE) Therapeutic concentrations vary significantly. A range of 10-30 ug/mL  may be an effective concentration for many patients. However, some  are best treated at concentrations outside of this range. Acetaminophen concentrations >150 ug/mL at 4 hours after ingestion  and >50 ug/mL at 12 hours after ingestion are often associated with  toxic reactions.  Performed at [facility], [address], [place], [place] [address]    WBC 11/13/2022 7.6  4.0 - 10.5 K/uL Final   RBC 11/13/2022 4.70  4.22 - 5.81 MIL/uL Final   Hemoglobin 11/13/2022 14.2  13.0 - 17.0 g/dL Final   HCT 29/56/2130 43.6  39.0 - 52.0 % Final   MCV 11/13/2022 92.8  80.0 - 100.0 fL Final   MCH 11/13/2022 30.2  26.0 - 34.0 pg Final   MCHC 11/13/2022 32.6  30.0 - 36.0 g/dL Final   RDW 86/57/8469 15.0  11.5 - 15.5 % Final   Platelets 11/13/2022 308  150 - 400 K/uL Final   nRBC 11/13/2022 0.0  0.0 - 0.2 % Final   Performed at [facility], [address] [place], [place] [address]   Opiates 11/13/2022 NONE DETECTED  NONE DETECTED Final   Cocaine 11/13/2022 POSITIVE (A)  NONE DETECTED Final   Benzodiazepines 11/13/2022 POSITIVE (A)  NONE DETECTED Final   Amphetamines 11/13/2022 NONE DETECTED  NONE DETECTED Final   Tetrahydrocannabinol 11/13/2022 POSITIVE (A)  NONE DETECTED Final   Barbiturates 11/13/2022 NONE DETECTED  NONE DETECTED Final   Comment: (NOTE) DRUG SCREEN FOR MEDICAL PURPOSES ONLY.  IF CONFIRMATION IS NEEDED FOR ANY PURPOSE, NOTIFY LAB WITHIN 5 DAYS.  LOWEST DETECTABLE LIMITS FOR URINE DRUG SCREEN Drug Class                     Cutoff (ng/mL) Amphetamine and metabolites    1000 Barbiturate and metabolites    200 Benzodiazepine                 200 Opiates and metabolites        300 Cocaine and metabolites        300 THC                            50 Performed at [facility], [address] [place], [place] [address]   Admission on  11/07/2022, Discharged on 11/07/2022  Component Date Value Ref Range Status   Acetaminophen (Tylenol), Serum 11/07/2022 <10 (L)  10 - 30 ug/mL Final   Comment: (NOTE) Therapeutic concentrations vary significantly. A range of 10-30 ug/mL  may be an effective concentration for many  patients. However, some  are best treated at concentrations outside of this range. Acetaminophen concentrations >150 ug/mL at 4 hours after ingestion  and >50 ug/mL at 12 hours after ingestion are often associated with  toxic reactions.  Performed at [facility], [address] [place], [place] [address]    Sodium 11/07/2022 137  135 - 145 mmol/L Final   Potassium 11/07/2022 3.7  3.5 - 5.1 mmol/L Final   Chloride 11/07/2022 107  98 - 111 mmol/L Final   CO2 11/07/2022 22  22 - 32 mmol/L Final   Glucose, Bld 11/07/2022 147 (H)  70 - 99 mg/dL Final   Glucose reference range applies only to samples taken after fasting for at least 8 hours.   BUN 11/07/2022 18  6 - 20 mg/dL Final   Creatinine, Ser 11/07/2022 0.93  0.61 - 1.24 mg/dL Final   Calcium 40/98/1191 8.5 (L)  8.9 - 10.3 mg/dL Final   Total Protein 47/82/9562 6.5  6.5 - 8.1 g/dL Final   Albumin 13/08/6576 3.5  3.5 - 5.0 g/dL Final   AST 46/96/2952 24  15 - 41 U/L Final   ALT 11/07/2022 21  0 - 44 U/L Final   Alkaline Phosphatase 11/07/2022 65  38 - 126 U/L Final   Total Bilirubin 11/07/2022 1.0  0.3 - 1.2 mg/dL Final   GFR, Estimated 11/07/2022 >60  >60 mL/min Final   Comment: (NOTE) Calculated using the CKD-EPI Creatinine Equation (2021)    Anion gap 11/07/2022 8  5 - 15 Final   Performed at [facility], [address] [place], [place] [address]   Alcohol, Ethyl (B) 11/07/2022 <10  <10 mg/dL Final   Comment: (NOTE) Lowest detectable limit for serum alcohol is 10 mg/dL.  For medical purposes only. Performed at [facility], [address] [place], [place] [address]    Salicylate Lvl 11/07/2022 <7.0 (L)  7.0 -  30.0 mg/dL Final   Performed at [facility], [address] [place], [place] [address]   WBC 11/07/2022 10.9 (H)  4.0 - 10.5 K/uL Final   RBC 11/07/2022 3.91 (L)  4.22 - 5.81 MIL/uL Final   Hemoglobin 11/07/2022 11.8 (L)  13.0 - 17.0 g/dL Final   HCT 66/44/0347 35.3 (L)  39.0 - 52.0 % Final   MCV 11/07/2022 90.3  80.0 - 100.0 fL Final   MCH 11/07/2022 30.2  26.0 - 34.0 pg Final   MCHC 11/07/2022 33.4  30.0 - 36.0 g/dL Final   RDW 42/59/5638 14.9  11.5 - 15.5 % Final   Platelets 11/07/2022 254  150 - 400 K/uL Final   nRBC 11/07/2022 0.0  0.0 - 0.2 % Final   Neutrophils Relative % 11/07/2022 84  % Final   Neutro Abs 11/07/2022 9.1 (H)  1.7 - 7.7 K/uL Final   Lymphocytes Relative 11/07/2022 7  % Final   Lymphs Abs 11/07/2022 0.8  0.7 - 4.0 K/uL Final   Monocytes Relative 11/07/2022 6  % Final   Monocytes Absolute 11/07/2022 0.6  0.1 - 1.0 K/uL Final   Eosinophils Relative 11/07/2022 2  % Final   Eosinophils Absolute 11/07/2022 0.2  0.0 - 0.5 K/uL Final   Basophils Relative 11/07/2022 1  % Final   Basophils Absolute 11/07/2022 0.1  0.0 - 0.1 K/uL Final   Immature Granulocytes 11/07/2022 0  % Final   Abs Immature Granulocytes 11/07/2022 0.04  0.00 - 0.07 K/uL Final   Performed at [facility], [address] [place], [place] [address]  Admission on 10/27/2022, Discharged on 11/01/2022  Component Date Value Ref Range Status   Cholesterol 10/31/2022 203 (H)  0 - 200 mg/dL Final   Triglycerides 62/13/0865 196 (H)  <150 mg/dL Final   HDL 78/46/9629 84  >40 mg/dL Final   Total CHOL/HDL Ratio 10/31/2022 2.4  RATIO Final   VLDL 10/31/2022 39  0 - 40 mg/dL Final   LDL Cholesterol 10/31/2022 80  0 - 99 mg/dL Final   Comment:        Total Cholesterol/HDL:CHD Risk Coronary Heart Disease Risk Table                     Men   Women  1/2 Average Risk   3.4   3.3  Average Risk       5.0   4.4  2 X Average Risk   9.6   7.1  3 X Average Risk  23.4   11.0        Use the  calculated Patient Ratio above and the CHD Risk Table to determine the patient's CHD Risk.        ATP III CLASSIFICATION (LDL):  <100     mg/dL   Optimal  528-413  mg/dL   Near or Above                    Optimal  130-159  mg/dL   Borderline  244-010  mg/dL   High  >272     mg/dL   Very High Performed at [facility], [address] [place], [place] [address]   Admission on 10/26/2022, Discharged on 10/27/2022  Component Date Value Ref Range Status   WBC 10/26/2022 9.6  4.0 - 10.5 K/uL Final   RBC 10/26/2022 4.98  4.22 - 5.81 MIL/uL Final   Hemoglobin 10/26/2022 14.7  13.0 - 17.0 g/dL Final   HCT 40/34/7425 44.1  39.0 - 52.0 % Final   MCV 10/26/2022 88.6  80.0 - 100.0 fL Final   MCH 10/26/2022 29.5  26.0 - 34.0 pg Final   MCHC 10/26/2022 33.3  30.0 - 36.0 g/dL Final   RDW 95/63/8756 14.3  11.5 - 15.5 % Final   Platelets 10/26/2022 378  150 - 400 K/uL Final   nRBC 10/26/2022 0.0  0.0 - 0.2 % Final   Performed at [facility], [address] [place], [place] [address]   Sodium 10/26/2022 135  135 - 145 mmol/L Final   Potassium 10/26/2022 4.1  3.5 - 5.1 mmol/L Final   Chloride 10/26/2022 101  98 - 111 mmol/L Final   CO2 10/26/2022 23  22 - 32 mmol/L Final   Glucose, Bld 10/26/2022 93  70 - 99 mg/dL Final   Glucose reference range applies only to samples taken after fasting for at least 8 hours.   BUN 10/26/2022 15  6 - 20 mg/dL Final   Creatinine, Ser 10/26/2022 0.89  0.61 - 1.24 mg/dL Final   Calcium 51/88/4166 9.2  8.9 - 10.3 mg/dL Final   GFR, Estimated 10/26/2022 >60  >60 mL/min Final   Comment: (NOTE) Calculated using the CKD-EPI Creatinine Equation (2021)    Anion gap 10/26/2022 11  5 - 15 Final   Performed at [facility], [address] [place], [place] [address]   Troponin I (High Sensitivity) 10/26/2022 4  <18 ng/L Final   Comment: (NOTE) Elevated high sensitivity troponin I (hsTnI) values and significant  changes  across serial  measurements may suggest ACS but many other  chronic and acute conditions are known to elevate hsTnI results.  Refer to the "Links" section for chest pain algorithms and additional  guidance. Performed at [facility], [address] [place], [place] [address]    Acetaminophen (Tylenol), Serum 10/26/2022 <10 (L)  10 - 30 ug/mL Final   Comment: (NOTE) Therapeutic concentrations vary significantly. A range of 10-30 ug/mL  may be an effective concentration for many patients. However, some  are best treated at concentrations outside of this range. Acetaminophen concentrations >150 ug/mL at 4 hours after ingestion  and >50 ug/mL at 12 hours after ingestion are often associated with  toxic reactions.  Performed at [facility], [address] [place], [place] [address]    Salicylate Lvl 10/26/2022 <7.0 (L)  7.0 - 30.0 mg/dL Final   Performed at [facility], [address] [place], [place] [address]   Alcohol, Ethyl (B) 10/26/2022 <10  <10 mg/dL Final   Comment: (NOTE) Lowest detectable limit for serum alcohol is 10 mg/dL.  For medical purposes only. Performed at [facility], [address] [place], [place] [address]    SARS Coronavirus 2 by RT PCR 10/26/2022 NEGATIVE  NEGATIVE Final   Comment: (NOTE) SARS-CoV-2 target nucleic acids are NOT DETECTED.  The SARS-CoV-2 RNA is generally detectable in upper respiratory specimens during the acute phase of infection. The lowest concentration of SARS-CoV-2 viral copies this assay can detect is 138 copies/mL. A negative result does not preclude SARS-Cov-2 infection and should not be used as the sole basis for treatment or other patient management decisions. A negative result may occur with  improper specimen collection/handling, submission of specimen other than nasopharyngeal swab, presence of viral mutation(s) within the areas targeted by this assay, and inadequate number of viral copies(<138  copies/mL). A negative result must be combined with clinical observations, patient history, and epidemiological information. The expected result is Negative.  Fact Sheet for Patients:  BloggerCourse.com  Fact Sheet for Healthcare Providers:  SeriousBroker.it  This test is no                          t yet approved or cleared by the [place] FDA and  has been authorized for detection and/or diagnosis of SARS-CoV-2 by FDA under an Emergency Use Authorization (EUA). This EUA will remain  in effect (meaning this test can be used) for the duration of the COVID-19 declaration under Section 564(b)(1) of the Act, 21 U.S.C.section 360bbb-3(b)(1), unless the authorization is terminated  or revoked sooner.       Influenza A by PCR 10/26/2022 NEGATIVE  NEGATIVE Final   Influenza B by PCR 10/26/2022 NEGATIVE  NEGATIVE Final   Comment: (NOTE) The Xpert Xpress SARS-CoV-2/FLU/RSV plus assay is intended as an aid in the diagnosis of influenza from Nasopharyngeal swab specimens and should not be used as a sole basis for treatment. Nasal washings and aspirates are unacceptable for Xpert Xpress SARS-CoV-2/FLU/RSV testing.  Fact Sheet for Patients: BloggerCourse.com  Fact Sheet for Healthcare Providers: SeriousBroker.it  This test is not yet approved or cleared by the [place] FDA and has been authorized for detection and/or diagnosis of SARS-CoV-2 by FDA under an Emergency Use Authorization (EUA). This EUA will remain in effect (meaning this test can be used) for the duration of the COVID-19 declaration under Section 564(b)(1) of the Act, 21 U.S.C. section 360bbb-3(b)(1), unless the authorization is  terminated or revoked.     Resp Syncytial Virus by PCR 10/26/2022 NEGATIVE  NEGATIVE Final   Comment: (NOTE) Fact Sheet for Patients: BloggerCourse.com  Fact  Sheet for Healthcare Providers: SeriousBroker.it  This test is not yet approved or cleared by the [place] FDA and has been authorized for detection and/or diagnosis of SARS-CoV-2 by FDA under an Emergency Use Authorization (EUA). This EUA will remain in effect (meaning this test can be used) for the duration of the COVID-19 declaration under Section 564(b)(1) of the Act, 21 U.S.C. section 360bbb-3(b)(1), unless the authorization is terminated or revoked.  Performed at [facility], [address] [place], [place] [address]    Troponin I (High Sensitivity) 10/26/2022 5  <18 ng/L Final   Comment: (NOTE) Elevated high sensitivity troponin I (hsTnI) values and significant  changes across serial measurements may suggest ACS but many other  chronic and acute conditions are known to elevate hsTnI results.  Refer to the "Links" section for chest pain algorithms and additional  guidance. Performed at [facility], [address] [place], [place] [address]    Tricyclic, Ur Screen 10/26/2022 NONE DETECTED  NONE DETECTED Final   Amphetamines, Ur Screen 10/26/2022 NONE DETECTED  NONE DETECTED Final   MDMA (Ecstasy)Ur Screen 10/26/2022 NONE DETECTED  NONE DETECTED Final   Cocaine Metabolite,Ur [place] 10/26/2022 POSITIVE (A)  NONE DETECTED Final   Opiate, Ur Screen 10/26/2022 NONE DETECTED  NONE DETECTED Final   Phencyclidine (PCP) Ur S 10/26/2022 NONE DETECTED  NONE DETECTED Final   Cannabinoid 50 Ng, Ur  10/26/2022 NONE DETECTED  NONE DETECTED Final   Barbiturates, Ur Screen 10/26/2022 NONE DETECTED  NONE DETECTED Final   Benzodiazepine, Ur Scrn 10/26/2022 POSITIVE (A)  NONE DETECTED Final   Methadone Scn, Ur 10/26/2022 NONE DETECTED  NONE DETECTED Final   Comment: (NOTE) Tricyclics + metabolites, urine    Cutoff 1000 ng/mL Amphetamines + metabolites, urine  Cutoff 1000 ng/mL MDMA (Ecstasy), urine              Cutoff 500 ng/mL Cocaine  Metabolite, urine          Cutoff 300 ng/mL Opiate + metabolites, urine        Cutoff 300 ng/mL Phencyclidine (PCP), urine         Cutoff 25 ng/mL Cannabinoid, urine                 Cutoff 50 ng/mL Barbiturates + metabolites, urine  Cutoff 200 ng/mL Benzodiazepine, urine              Cutoff 200 ng/mL Methadone, urine                   Cutoff 300 ng/mL  The urine drug screen provides only a preliminary, unconfirmed analytical test result and should not be used for non-medical purposes. Clinical consideration and professional judgment should be applied to any positive drug screen result due to possible interfering substances. A more specific alternate chemical method must be used in order to obtain a confirmed analytical result. Gas chromatography / mass spectrometry (GC/MS) is the preferred confirm                          atory method. Performed at [facility], [address], [place], [place] [address]   Admission on 10/22/2022, Discharged on 10/24/2022  Component Date Value Ref Range Status   Sodium 10/22/2022 139  135 - 145 mmol/L Final  Potassium 10/22/2022 3.5  3.5 - 5.1 mmol/L Final   Chloride 10/22/2022 104  98 - 111 mmol/L Final   CO2 10/22/2022 27  22 - 32 mmol/L Final   Glucose, Bld 10/22/2022 128 (H)  70 - 99 mg/dL Final   Glucose reference range applies only to samples taken after fasting for at least 8 hours.   BUN 10/22/2022 14  6 - 20 mg/dL Final   Creatinine, Ser 10/22/2022 0.76  0.61 - 1.24 mg/dL Final   Calcium 21/30/8657 8.9  8.9 - 10.3 mg/dL Final   Total Protein 84/69/6295 7.8  6.5 - 8.1 g/dL Final   Albumin 28/41/3244 4.4  3.5 - 5.0 g/dL Final   AST 01/06/7251 22  15 - 41 U/L Final   ALT 10/22/2022 19  0 - 44 U/L Final   Alkaline Phosphatase 10/22/2022 69  38 - 126 U/L Final   Total Bilirubin 10/22/2022 1.1  0.3 - 1.2 mg/dL Final   GFR, Estimated 10/22/2022 >60  >60 mL/min Final   Comment: (NOTE) Calculated using the CKD-EPI Creatinine Equation  (2021)    Anion gap 10/22/2022 8  5 - 15 Final   Performed at [facility], [address] [place], [place] [address]   WBC 10/22/2022 11.9 (H)  4.0 - 10.5 K/uL Final   RBC 10/22/2022 4.85  4.22 - 5.81 MIL/uL Final   Hemoglobin 10/22/2022 14.4  13.0 - 17.0 g/dL Final   HCT 34/74/2595 44.3  39.0 - 52.0 % Final   MCV 10/22/2022 91.3  80.0 - 100.0 fL Final   MCH 10/22/2022 29.7  26.0 - 34.0 pg Final   MCHC 10/22/2022 32.5  30.0 - 36.0 g/dL Final   RDW 63/87/5643 13.9  11.5 - 15.5 % Final   Platelets 10/22/2022 279  150 - 400 K/uL Final   nRBC 10/22/2022 0.0  0.0 - 0.2 % Final   Neutrophils Relative % 10/22/2022 72  % Final   Neutro Abs 10/22/2022 8.5 (H)  1.7 - 7.7 K/uL Final   Lymphocytes Relative 10/22/2022 14  % Final   Lymphs Abs 10/22/2022 1.7  0.7 - 4.0 K/uL Final   Monocytes Relative 10/22/2022 8  % Final   Monocytes Absolute 10/22/2022 1.0  0.1 - 1.0 K/uL Final   Eosinophils Relative 10/22/2022 5  % Final   Eosinophils Absolute 10/22/2022 0.7 (H)  0.0 - 0.5 K/uL Final   Basophils Relative 10/22/2022 1  % Final   Basophils Absolute 10/22/2022 0.1  0.0 - 0.1 K/uL Final   Immature Granulocytes 10/22/2022 0  % Final   Abs Immature Granulocytes 10/22/2022 0.04  0.00 - 0.07 K/uL Final   Performed at [facility], [address] [place], [place] [address]   Troponin I (High Sensitivity) 10/22/2022 5  <18 ng/L Final   Comment: (NOTE) Elevated high sensitivity troponin I (hsTnI) values and significant  changes across serial measurements may suggest ACS but many other  chronic and acute conditions are known to elevate hsTnI results.  Refer to the "Links" section for chest pain algorithms and additional  guidance. Performed at [facility], [address] [place], [place] [address]    Troponin I (High Sensitivity) 10/22/2022 5  <18 ng/L Final   Comment: (NOTE) Elevated high sensitivity troponin I (hsTnI) values and significant  changes across serial  measurements may suggest ACS but many other  chronic and acute conditions are known to elevate hsTnI results.  Refer to the "Links" section for chest pain algorithms and  additional  guidance. Performed at [facility], [address] [place], [place] [address]    pH, Ven 10/22/2022 7.34  7.25 - 7.43 Final   pCO2, Ven 10/22/2022 57  44 - 60 mmHg Final   pO2, Ven 10/22/2022 37  32 - 45 mmHg Final   Bicarbonate 10/22/2022 30.8 (H)  20.0 - 28.0 mmol/L Final   Acid-Base Excess 10/22/2022 3.5 (H)  0.0 - 2.0 mmol/L Final   O2 Saturation 10/22/2022 54.4  % Final   Patient temperature 10/22/2022 37.0   Final   Collection site 10/22/2022 VEIN   Final   Performed at [facility], [address] [place], [place] [address]   Glucose-Capillary 10/22/2022 198 (H)  70 - 99 mg/dL Final   Glucose reference range applies only to samples taken after fasting for at least 8 hours.   O2 Content 10/22/2022 4.0  L/min Final   Delivery systems 10/22/2022 NASAL CANNULA   Final   pH, Arterial 10/22/2022 7.41  7.35 - 7.45 Final   pCO2 arterial 10/22/2022 40  32 - 48 mmHg Final   pO2, Arterial 10/22/2022 124 (H)  83 - 108 mmHg Final   Bicarbonate 10/22/2022 25.4  20.0 - 28.0 mmol/L Final   Acid-Base Excess 10/22/2022 0.7  0.0 - 2.0 mmol/L Final   O2 Saturation 10/22/2022 99.3  % Final   Patient temperature 10/22/2022 37.0   Final   Collection site 10/22/2022 RIGHT RADIAL   Final   Allens test (pass/fail) 10/22/2022 PASS  PASS Final   Performed at [facility], [address], [place], [place] [address]  Admission on 09/24/2022, Discharged on 09/24/2022  Component Date Value Ref Range Status   Troponin I (High Sensitivity) 09/24/2022 5  <18 ng/L Final   Comment: (NOTE) Elevated high sensitivity troponin I (hsTnI) values and significant  changes across serial measurements may suggest ACS but many other  chronic and acute conditions are known to elevate hsTnI results.  Refer to  the "Links" section for chest pain algorithms and additional  guidance. Performed at [facility], [address] [place], [place] [address]    SARS Coronavirus 2 by RT PCR 09/24/2022 NEGATIVE  NEGATIVE Final   Influenza A by PCR 09/24/2022 NEGATIVE  NEGATIVE Final   Influenza B by PCR 09/24/2022 NEGATIVE  NEGATIVE Final   Comment: (NOTE) The Xpert Xpress SARS-CoV-2/FLU/RSV plus assay is intended as an aid in the diagnosis of influenza from Nasopharyngeal swab specimens and should not be used as a sole basis for treatment. Nasal washings and aspirates are unacceptable for Xpert Xpress SARS-CoV-2/FLU/RSV testing.  Fact Sheet for Patients: BloggerCourse.com  Fact Sheet for Healthcare Providers: SeriousBroker.it  This test is not yet approved or cleared by the [place] FDA and has been authorized for detection and/or diagnosis of SARS-CoV-2 by FDA under an Emergency Use Authorization (EUA). This EUA will remain in effect (meaning this test can be used) for the duration of the COVID-19 declaration under Section 564(b)(1) of the Act, 21 U.S.C. section 360bbb-3(b)(1), unless the authorization is terminated or revoked.     Resp Syncytial Virus by PCR 09/24/2022 NEGATIVE  NEGATIVE Final   Comment: (NOTE) Fact Sheet for Patients: BloggerCourse.com  Fact Sheet for Healthcare Providers: SeriousBroker.it  This test is not yet approved or cleared by the [place] FDA and has been authorized for detection and/or diagnosis of SARS-CoV-2 by FDA under an Emergency Use Authorization (EUA). This EUA will remain in effect (meaning this test can  be used) for the duration of the COVID-19 declaration under Section 564(b)(1) of the Act, 21 U.S.C. section 360bbb-3(b)(1), unless the authorization is terminated or revoked.  Performed at [facility], [address]  [place], [place] [address]    Sodium 09/24/2022 139  135 - 145 mmol/L Final   Potassium 09/24/2022 4.0  3.5 - 5.1 mmol/L Final   HEMOLYSIS AT THIS LEVEL MAY AFFECT RESULT   Chloride 09/24/2022 101  98 - 111 mmol/L Final   CO2 09/24/2022 27  22 - 32 mmol/L Final   Glucose, Bld 09/24/2022 121 (H)  70 - 99 mg/dL Final   Glucose reference range applies only to samples taken after fasting for at least 8 hours.   BUN 09/24/2022 10  6 - 20 mg/dL Final   Creatinine, Ser 09/24/2022 0.98  0.61 - 1.24 mg/dL Final   Calcium 27/25/3664 9.1  8.9 - 10.3 mg/dL Final   GFR, Estimated 09/24/2022 >60  >60 mL/min Final   Comment: (NOTE) Calculated using the CKD-EPI Creatinine Equation (2021)    Anion gap 09/24/2022 11  5 - 15 Final   Performed at [facility], [address] [place], [place] [address]   WBC 09/24/2022 8.0  4.0 - 10.5 K/uL Final   RBC 09/24/2022 4.78  4.22 - 5.81 MIL/uL Final   Hemoglobin 09/24/2022 14.3  13.0 - 17.0 g/dL Final   HCT 42/59/5638 42.7  39.0 - 52.0 % Final   MCV 09/24/2022 89.3  80.0 - 100.0 fL Final   MCH 09/24/2022 29.9  26.0 - 34.0 pg Final   MCHC 09/24/2022 33.5  30.0 - 36.0 g/dL Final   RDW 75/64/3329 14.0  11.5 - 15.5 % Final   Platelets 09/24/2022 333  150 - 400 K/uL Final   nRBC 09/24/2022 0.0  0.0 - 0.2 % Final   Neutrophils Relative % 09/24/2022 61  % Final   Neutro Abs 09/24/2022 4.8  1.7 - 7.7 K/uL Final   Lymphocytes Relative 09/24/2022 27  % Final   Lymphs Abs 09/24/2022 2.2  0.7 - 4.0 K/uL Final   Monocytes Relative 09/24/2022 8  % Final   Monocytes Absolute 09/24/2022 0.7  0.1 - 1.0 K/uL Final   Eosinophils Relative 09/24/2022 3  % Final   Eosinophils Absolute 09/24/2022 0.3  0.0 - 0.5 K/uL Final   Basophils Relative 09/24/2022 1  % Final   Basophils Absolute 09/24/2022 0.1  0.0 - 0.1 K/uL Final   Immature Granulocytes 09/24/2022 0  % Final   Abs Immature Granulocytes 09/24/2022 0.02  0.00 - 0.07 K/uL Final   Performed at [facility],  [address], [place], [place] [address]  Office Visit on 07/12/2022  Component Date Value Ref Range Status   Hemoglobin A1C 07/12/2022 5.9 (A)  4.0 - 5.6 % Final  Admission on 06/30/2022, Discharged on 07/01/2022  Component Date Value Ref Range Status   WBC 06/30/2022 8.9  4.0 - 10.5 K/uL Final   RBC 06/30/2022 4.26  4.22 - 5.81 MIL/uL Final   Hemoglobin 06/30/2022 12.7 (L)  13.0 - 17.0 g/dL Final   HCT 16/60/6301 39.6  39.0 - 52.0 % Final   MCV 06/30/2022 93.0  80.0 - 100.0 fL Final   MCH 06/30/2022 29.8  26.0 - 34.0 pg Final   MCHC 06/30/2022 32.1  30.0 - 36.0 g/dL Final   RDW 60/10/9321 14.8  11.5 - 15.5 % Final   Platelets 06/30/2022 234  150 - 400 K/uL Final  nRBC 06/30/2022 0.0  0.0 - 0.2 % Final   Performed at [facility], [address], [place], [place] [address]   Sodium 06/30/2022 140  135 - 145 mmol/L Final   Potassium 06/30/2022 3.8  3.5 - 5.1 mmol/L Final   Chloride 06/30/2022 107  98 - 111 mmol/L Final   CO2 06/30/2022 27  22 - 32 mmol/L Final   Glucose, Bld 06/30/2022 108 (H)  70 - 99 mg/dL Final   Glucose reference range applies only to samples taken after fasting for at least 8 hours.   BUN 06/30/2022 28 (H)  6 - 20 mg/dL Final   Creatinine, Ser 06/30/2022 1.18  0.61 - 1.24 mg/dL Final   Calcium 16/01/930 9.0  8.9 - 10.3 mg/dL Final   GFR, Estimated 06/30/2022 >60  >60 mL/min Final   Comment: (NOTE) Calculated using the CKD-EPI Creatinine Equation (2021)    Anion gap 06/30/2022 6  5 - 15 Final   Performed at [facility], [address] [place], [place] [address]   Sodium 06/30/2022 140  135 - 145 mmol/L Final   Potassium 06/30/2022 3.7  3.5 - 5.1 mmol/L Final   Chloride 06/30/2022 105  98 - 111 mmol/L Final   BUN 06/30/2022 28 (H)  6 - 20 mg/dL Final   Creatinine, Ser 06/30/2022 1.10  0.61 - 1.24 mg/dL Final   Glucose, Bld 22/02/5425 147 (H)  70 - 99 mg/dL Final   Glucose reference range applies only to samples taken  after fasting for at least 8 hours.   Calcium, Ion 06/30/2022 1.24  1.15 - 1.40 mmol/L Final   TCO2 06/30/2022 28  22 - 32 mmol/L Final   Hemoglobin 06/30/2022 12.6 (L)  13.0 - 17.0 g/dL Final   HCT 06/27/7626 37.0 (L)  39.0 - 52.0 % Final   B Natriuretic Peptide 06/30/2022 47.7  0.0 - 100.0 pg/mL Final   Performed at [facility], [address] [place], [place] [address]   Troponin I (High Sensitivity) 07/01/2022 4  <18 ng/L Final   Comment: (NOTE) Elevated high sensitivity troponin I (hsTnI) values and significant  changes across serial measurements may suggest ACS but many other  chronic and acute conditions are known to elevate hsTnI results.  Refer to the "Links" section for chest pain algorithms and additional  guidance. Performed at [facility], [address], [place], [place] [address]   There may be more visits with results that are not included.    Blood Alcohol level:  Lab Results  Component Value Date   ETH <10 12/02/2022   ETH <10 11/17/2022    Metabolic Disorder Labs: Lab Results  Component Value Date   HGBA1C 5.9 (A) 07/12/2022   MPG 117 02/02/2009   No results found for: "PROLACTIN" Lab Results  Component Value Date   CHOL 203 (H) 10/31/2022   TRIG 196 (H) 10/31/2022   HDL 84 10/31/2022   CHOLHDL 2.4 10/31/2022   VLDL 39 10/31/2022   LDLCALC 80 10/31/2022   LDLCALC 63 10/08/2014    Therapeutic Lab Levels: No results found for: "LITHIUM" No results found for: "VALPROATE" No results found for: "CBMZ"  Physical Findings   AIMS    Flowsheet Row Admission (Discharged) from 02/02/2016 [facility] Admission (Discharged) from 03/22/2015 in [facility] Admission (Discharged) from 10/05/2014 in [facility] INPATIENT BE[facility]AV[facility]AL MEDICINE Admission (Discharged) [facility]  AIMS Total  Score 0 0 0 0      AUDIT    Flowsheet  Row Admission (Discharged) from 11/17/2022 in [facility] INPATIENT BEHAVIORAL MEDICINE Admission (Discharged) from 10/27/2022 in [facility] INPATIENT BEHAVIORAL MEDICINE Admission (Discharged) from 03/22/2015 in [facility] Admission (Discharged) from 10/05/2014 in [facility] INPATIENT BEHAVIORAL MEDICINE Admission (Discharged) from 10/01/2014 in [facility]  Alcohol Use Disorder Identification Test Final Score (AUDIT) 14 14 25 18  34      Flowsheet Row ED from 12/03/2022 in [facility] ED from 12/02/2022 in [facility] Department [facility] Admission (Discharged) from 11/17/2022 in [facility] INPATIENT BEHAVIORAL MEDICINE  C-SSRS RISK CATEGORY No Risk Error: Q3, 4, or 5 should not be populated when Q2 is No Moderate Risk        Musculoskeletal  Strength & Muscle Tone: within normal limits Gait & Station: normal Patient leans: N/A  Psychiatric Specialty Exam  Presentation  General Appearance:  Appropriate for Environment; Casual  Eye Contact: Fair  Speech: Clear and Coherent; Normal Rate  Speech Volume: Normal  Handedness: Right   Mood and Affect  Mood: Depressed; Anxious  Affect: Congruent; Blunt   Thought Process  Thought Processes: Coherent; Goal Directed; Linear  Descriptions of Associations:Intact  Orientation:Full (Time, Place and Person)  Thought Content:Logical  Diagnosis of Schizophrenia or Schizoaffective disorder in past: No  Duration of Psychotic Symptoms: Greater than six months   Hallucinations:No data recorded Ideas of Reference:None  Suicidal Thoughts:No data recorded Homicidal Thoughts:No data recorded  Sensorium  Memory: Immediate Good; Recent Good  Judgment: Fair  Insight: Good   Executive Functions  Concentration: Good  Attention Span: Good  Recall: Good  Fund of Knowledge: Good  Language: Good   Psychomotor Activity  Psychomotor Activity:No  data recorded  Assets  Assets: Communication Skills; Desire for Improvement; Financial Resources/Insurance; Resilience   Sleep  Sleep:No data recorded  No data recorded  Physical Exam  Physical Exam Vitals and nursing note reviewed.  Neurological:     Mental Status: He is alert.  Psychiatric:        Mood and Affect: Mood normal.        Thought Content: Thought content normal.    Review of Systems  Psychiatric/Behavioral:  Positive for depression and substance abuse. The patient is nervous/anxious.    Blood pressure 128/66, pulse 62, temperature 98.7 F (37.1 C), temperature source Oral, resp. rate 18, SpO2 98%. There is no height or weight on file to calculate BMI.  Treatment Plan Summary: Daily contact with patient to assess and evaluate symptoms and progress in treatment and Medication management  Continue with current treatment for about 12/05/2022 as listed below except were noted  Substance-induced mood disorder: Cocaine abuse/misuse:   Continue Ativan taper per CIWA protocol Restarted Prozac 20 mg daily Restarted Risperdal 1 mg nightly Continue trazodone 50 mg nightly as needed   CSW to start working on discharge disposition F/u patient reported he is already been in contact with [facility] for admission acceptance Patient encouraged to participate in therapeutic milieu  Oneta Rack, NP 12/05/2022 11:38 AM

## 2022-12-05 NOTE — ED Notes (Signed)
Patient resting with eyes closed in no apparent acute distress. Respirations even and unlabored. Environment secured. Safety checks in place according to facility policy.

## 2022-12-05 NOTE — Group Note (Signed)
Group Topic: Overcoming Obstacles  Group Date: 12/05/2022 Start Time: 1000 End Time: 1100 Facilitators: Candis Schatz, NT  Department: [facility]  Number of Participants: 4  Group Focus: daily focus Treatment Modality:  Psychoeducation Interventions utilized were story telling Purpose: increase insight  Name: Franklin [name] Date of Birth: [birth year]  MR: [id number]    Level of Participation: N/A Quality of Participation: N/A Interactions with others: N/A Mood/Affect: good Triggers (if applicable): N/A Cognition: N/A Progress: None Response: N/A Plan: patient will be encouraged to participate in recovery process  Patients Problems:  Patient Active Problem List   Diagnosis Date Noted   Cocaine abuse with cocaine-induced mood disorder (HCC) 12/03/2022   Major depressive disorder, 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

## 2022-12-05 NOTE — Group Note (Signed)
Group Topic: Relapse and Recovery  Group Date: 12/05/2022 Start Time: 1510 End Time: 1520 Facilitators: Jule Schlabach, Jacklynn Barnacle, RN  Department: [facility]  Number of Participants: 4  Group Focus: abuse issues and coping skills Treatment Modality:  Patient-Centered Therapy Interventions utilized were assignment Purpose: increase insight  Name: Franklin [name] Date of Birth: 13-Oct-1970  MR: [id number]    Level of Participation: did not participate in group Quality of Participation: did not participate in group Interactions with others: did not participate in group  Mood/Affect: did not participate in group  Triggers (if applicable): did not participate in group  Cognition: did not participate in group  Progress: None Response: did not participate in group  Plan: patient will be encouraged to participate in future groups  Patients Problems:  Patient Active Problem List   Diagnosis Date Noted   Cocaine abuse with cocaine-induced mood disorder (HCC) 12/03/2022   Major depressive disorder, 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

## 2022-12-05 NOTE — ED Notes (Signed)
Patient alert & oriented x4. Denies intent to harm self or others when asked. Denies A/VH. Patient reports chronic pain in foot rating 4/10 at the moment, denies offer for medication at this time stating they'll ask for medication later. No acute distress noted. Support and encouragement provided. Routine safety checks conducted per facility protocol. Encouraged patient to notify staff if any thoughts of harm towards self or others arise. Patient verbalizes understanding and agreement.

## 2022-12-05 NOTE — ED Notes (Signed)
Patient in the bedroom composed and sleeping. NAD. Respirations even and unlabored. Will continue to monitor for safety.

## 2022-12-05 NOTE — ED Notes (Signed)
Pt in the bedroom composed and sleeping. NAD. Respirations even and unlabored. No complaints at the moment. Will keep monitoring for safety.

## 2022-12-06 DIAGNOSIS — F32A Depression, unspecified: Secondary | ICD-10-CM | POA: Diagnosis not present

## 2022-12-06 DIAGNOSIS — F1414 Cocaine abuse with cocaine-induced mood disorder: Secondary | ICD-10-CM | POA: Diagnosis not present

## 2022-12-06 NOTE — Group Note (Signed)
Group Topic: Communication  Group Date: 12/06/2022 Start Time: 1930 End Time: 2012 Facilitators: Rae Lips B  Department: [facility]  Number of Participants: 4  Group Focus: activities of daily living skills, check in, clarity of thought, and personal responsibility Treatment Modality:  Exposure Therapy Interventions utilized were story telling and support Purpose: express feelings  Name: Franklin [name] Date of Birth: October 09, 1970  MR: [id number]    Level of Participation: PT DID NOT ATTEND GROUP Quality of Participation: attentive and cooperative Interactions with others: gave feedback Mood/Affect: appropriate Triggers (if applicable): NA Cognition: coherent/clear Progress: None Response: NA Plan: patient will be encouraged to go to groups.   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

## 2022-12-06 NOTE — Group Note (Signed)
Group Topic: Wellness  Group Date: 12/06/2022 Start Time: 1700 End Time: 1730 Facilitators: Dickie La, RN  Department: [facility]  Number of Participants: 7  Group Focus: relapse prevention and self-awareness Treatment Modality:  Psychoeducation Interventions utilized were patient education, problem solving, and support Purpose: enhance coping skills and increase insight  Name: Dalante Lebsack Date of Birth: 07-11-1970  MR: [id number]    Level of Participation: active Quality of Participation: attentive, cooperative, and engaged Interactions with others: gave feedback Mood/Affect: appropriate Triggers (if applicable): none Cognition: coherent/clear, goal directed, and insightful Progress: Gaining insight Response: Pt participated in group and gave a positive feedback. Plan: follow-up needed  Patients Problems:  Patient Active Problem List   Diagnosis Date Noted   Cocaine abuse with cocaine-induced mood disorder (HCC) 12/03/2022   Major depressive disorder, 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

## 2022-12-06 NOTE — Progress Notes (Signed)
Pt is awake, alert and oriented X3. Pt complained of bilateral foot pain 7/10. Pt declined intervention when offered. No signs of acute distress noted. Administered scheduled meds per order. Pt denies current SI/HI/AVH, plan or intent. Staff will monitor for pt's safety.

## 2022-12-06 NOTE — Progress Notes (Signed)
 Pt slept most of the shift but woke up for meals. No distress noted or concerns voiced. Staff will monitor for pt's safety.

## 2022-12-06 NOTE — ED Notes (Signed)
Patient is sleeping. Respirations equal and unlabored, skin warm and dry. No change in assessment or acuity. Routine safety checks conducted according to facility protocol. Will continue to monitor for safety.   

## 2022-12-06 NOTE — ED Notes (Signed)
Pt is in his room resting in bed. Pt denies SI/HI/AVH. No acute distress noted. Will continue to monitor for safety.

## 2022-12-06 NOTE — ED Notes (Signed)
Patient in the bedroom calm and sleeping. NAD Respirations even and unlabored. Will continue monitor for safety

## 2022-12-06 NOTE — Group Note (Signed)
Group Topic: Change and Accountability AA Group Meeting Group Date: 12/06/2022 Start Time: 1009 End Time: 1110 Facilitators: Vonzell Schlatter B  Department: [facility]  Number of Participants: 6  Group Focus: community group and daily focus Treatment Modality:  Psychoeducation Interventions utilized were story telling and support Purpose: enhance coping skills and reinforce self-care  Name: Franklin [name] Date of Birth: 27-May-1970  MR: [id number]    Level of Participation: minimal Quality of Participation: attentive and cooperative Interactions with others: gave feedback Mood/Affect: positive Triggers (if applicable): n/a Cognition: coherent/clear Progress: Moderate Response: n/a Plan: follow-up needed  Patients Problems:  Patient Active Problem List   Diagnosis Date Noted   Cocaine abuse with cocaine-induced mood disorder (HCC) 12/03/2022   Major depressive disorder, 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

## 2022-12-06 NOTE — Care Management (Signed)
[facility] Care Management   10am  Writer contacted [facility] in [place] and they do not have a residential program.  The closest [facility] residential program is in [place].    12pm  Writer faxed a referral to [facility].

## 2022-12-06 NOTE — ED Provider Notes (Signed)
Behavioral Health Progress Note  Date and Time: 12/06/2022 9:04 AM Name: Franklin [name] MRN:  [id number]  Subjective:  Franklin [name] is a 52 year old male with a past psychiatric history significant for alcohol use disorder and cocaine abuse with cocaine induced mood disorder who presents to [facility] from [facility] with a chief complaint of cocaine abuse with cocaine induced mood disorder.   The patient was seen in his room, no acute distress. On assessment, the patient feels "calm" today.  Patient reports having poor sleep, reporting difficulty staying asleep.  Patient reports good appetite. Patient feels that the medications have been helpful and denies withdrawal symptoms.  Patient denies current SI, HI, AVH.   Diagnosis:  Final diagnoses:  Cocaine abuse with cocaine-induced mood disorder (HCC)    Total Time spent with patient: 15 minutes  Past Psychiatric History: Alcohol use disorder Cocaine use disorder Substance abuse disorder Major depressive disorder, recurrent severe, without psychotic features Generalized anxiety disorder Past Medical History: Chronic obstructive pulmonary disease Premature ventricular contraction Solitary pulmonary nodule Prediabetes Family History: Patient has a family history of hypertension and diabetes Family Psychiatric  History:  Social History:  No pertinent social history  Additional Social History:        Sleep: Good  Appetite:  Good  Current Medications:  Current Facility-Administered Medications  Medication Dose Route Frequency Provider Last Rate Last Admin   acetaminophen (TYLENOL) tablet 650 mg  650 mg Oral Q6H PRN Nwoko, Uchenna E, PA   650 mg at 12/03/22 2002   alum & mag hydroxide-simeth (MAALOX/MYLANTA) 200-200-20 MG/5ML suspension 30 mL  30 mL Oral Q4H PRN Nwoko, Uchenna E, PA       diphenhydrAMINE (BENADRYL) capsule 50 mg  50 mg Oral Q6H PRN Nwoko, Uchenna E, PA        Or   diphenhydrAMINE (BENADRYL) injection 50 mg  50 mg Intramuscular Q6H PRN Nwoko, Uchenna E, PA       FLUoxetine (PROZAC) capsule 20 mg  20 mg Oral Daily Oneta Rack, NP   20 mg at 12/06/22 4098   haloperidol (HALDOL) tablet 5 mg  5 mg Oral Q6H PRN Nwoko, Uchenna E, PA       Or   haloperidol lactate (HALDOL) injection 5 mg  5 mg Intramuscular Q6H PRN Nwoko, Uchenna E, PA       hydrOXYzine (ATARAX) tablet 25 mg  25 mg Oral TID PRN Nwoko, Uchenna E, PA       hydrOXYzine (ATARAX) tablet 25 mg  25 mg Oral Q6H PRN Nwoko, Uchenna E, PA       loperamide (IMODIUM) capsule 2-4 mg  2-4 mg Oral PRN Nwoko, Uchenna E, PA       LORazepam (ATIVAN) tablet 2 mg  2 mg Oral Q6H PRN Nwoko, Uchenna E, PA       Or   LORazepam (ATIVAN) injection 2 mg  2 mg Intramuscular Q6H PRN Nwoko, Uchenna E, PA       LORazepam (ATIVAN) tablet 1 mg  1 mg Oral BID Nwoko, Uchenna E, PA   1 mg at 12/06/22 1191   Followed by   [name] ON 12/07/2022] LORazepam (ATIVAN) tablet 1 mg  1 mg Oral Daily Nwoko, Uchenna E, PA       magnesium hydroxide (MILK OF MAGNESIA) suspension 30 mL  30 mL Oral Daily PRN Nwoko, Uchenna E, PA       multivitamin with minerals tablet 1 tablet  1 tablet Oral  Daily Nwoko, Uchenna E, PA   1 tablet at 12/06/22 0837   ondansetron (ZOFRAN-ODT) disintegrating tablet 4 mg  4 mg Oral Q6H PRN Nwoko, Uchenna E, PA       risperiDONE (RISPERDAL) tablet 1 mg  1 mg Oral QHS Oneta Rack, NP   1 mg at 12/05/22 2117   thiamine (VITAMIN B1) tablet 100 mg  100 mg Oral Daily Nwoko, Uchenna E, PA   100 mg at 12/06/22 0837   traZODone (DESYREL) tablet 50 mg  50 mg Oral QHS PRN Nwoko, Uchenna E, PA   50 mg at 12/04/22 2123   Current Outpatient Medications  Medication Sig Dispense Refill   benztropine (COGENTIN) 1 MG tablet Take 1 tablet (1 mg total) by mouth daily for 10 days. 10 tablet 0   carvedilol (COREG) 3.125 MG tablet Take 1 tablet (3.125 mg total) by mouth 2 (two) times daily with a meal. 10 tablet 0    FLUoxetine (PROZAC) 20 MG capsule Take 1 capsule (20 mg total) by mouth daily for 10 days. 10 capsule 0   mometasone-formoterol (DULERA) 200-5 MCG/ACT AERO Inhale 2 puffs into the lungs 2 (two) times daily for 10 days. 1 each 0   risperiDONE (RISPERDAL) 1 MG tablet Take 1 tablet (1 mg total) by mouth at bedtime for 10 days. 10 tablet 0   topiramate (TOPAMAX) 50 MG tablet Take 1 tablet (50 mg total) by mouth daily for 10 days. 10 tablet 0   traZODone (DESYREL) 100 MG tablet Take 1 tablet (100 mg total) by mouth at bedtime as needed for up to 10 days for sleep. 10 tablet 0    Labs  Lab Results:  Admission on 12/02/2022, Discharged on 12/03/2022  Component Date Value Ref Range Status   Sodium 12/02/2022 139  135 - 145 mmol/L Final   Potassium 12/02/2022 3.9  3.5 - 5.1 mmol/L Final   Chloride 12/02/2022 103  98 - 111 mmol/L Final   CO2 12/02/2022 26  22 - 32 mmol/L Final   Glucose, Bld 12/02/2022 162 (H)  70 - 99 mg/dL Final   Glucose reference range applies only to samples taken after fasting for at least 8 hours.   BUN 12/02/2022 16  6 - 20 mg/dL Final   Creatinine, Ser 12/02/2022 1.11  0.61 - 1.24 mg/dL Final   Calcium 16/10/9602 9.0  8.9 - 10.3 mg/dL Final   Total Protein 54/09/8117 6.8  6.5 - 8.1 g/dL Final   Albumin 14/78/2956 4.0  3.5 - 5.0 g/dL Final   AST 21/30/8657 29  15 - 41 U/L Final   ALT 12/02/2022 19  0 - 44 U/L Final   Alkaline Phosphatase 12/02/2022 85  38 - 126 U/L Final   Total Bilirubin 12/02/2022 0.8  <1.2 mg/dL Final   GFR, Estimated 12/02/2022 >60  >60 mL/min Final   Comment: (NOTE) Calculated using the CKD-EPI Creatinine Equation (2021)    Anion gap 12/02/2022 10  5 - 15 Final   Performed at [facility], [address] [place], [place] [address]   WBC 12/02/2022 5.9  4.0 - 10.5 K/uL Final   RBC 12/02/2022 4.21 (L)  4.22 - 5.81 MIL/uL Final   Hemoglobin 12/02/2022 12.8 (L)  13.0 - 17.0 g/dL Final   HCT 29/52/8413 38.5 (L)  39.0 - 52.0 % Final   MCV  12/02/2022 91.4  80.0 - 100.0 fL Final   MCH 12/02/2022 30.4  26.0 - 34.0 pg Final   MCHC 12/02/2022 33.2  30.0 - 36.0 g/dL Final   RDW 16/10/9602 14.4  11.5 - 15.5 % Final   Platelets 12/02/2022 311  150 - 400 K/uL Final   nRBC 12/02/2022 0.0  0.0 - 0.2 % Final   Performed at [facility], [address] [place], [place] [address]   Tricyclic, Ur Screen 12/02/2022 NONE DETECTED  NONE DETECTED Final   Amphetamines, Ur Screen 12/02/2022 NONE DETECTED  NONE DETECTED Final   MDMA (Ecstasy)Ur Screen 12/02/2022 NONE DETECTED  NONE DETECTED Final   Cocaine Metabolite,Ur [place] 12/02/2022 POSITIVE (A)  NONE DETECTED Final   Opiate, Ur Screen 12/02/2022 NONE DETECTED  NONE DETECTED Final   Phencyclidine (PCP) Ur S 12/02/2022 NONE DETECTED  NONE DETECTED Final   Cannabinoid 50 Ng, Ur [place] 12/02/2022 NONE DETECTED  NONE DETECTED Final   Barbiturates, Ur Screen 12/02/2022 NONE DETECTED  NONE DETECTED Final   Benzodiazepine, Ur Scrn 12/02/2022 POSITIVE (A)  NONE DETECTED Final   Methadone Scn, Ur 12/02/2022 NONE DETECTED  NONE DETECTED Final   Comment: (NOTE) Tricyclics + metabolites, urine    Cutoff 1000 ng/mL Amphetamines + metabolites, urine  Cutoff 1000 ng/mL MDMA (Ecstasy), urine              Cutoff 500 ng/mL Cocaine Metabolite, urine          Cutoff 300 ng/mL Opiate + metabolites, urine        Cutoff 300 ng/mL Phencyclidine (PCP), urine         Cutoff 25 ng/mL Cannabinoid, urine                 Cutoff 50 ng/mL Barbiturates + metabolites, urine  Cutoff 200 ng/mL Benzodiazepine, urine              Cutoff 200 ng/mL Methadone, urine                   Cutoff 300 ng/mL  The urine drug screen provides only a preliminary, unconfirmed analytical test result and should not be used for non-medical purposes. Clinical consideration and professional judgment should be applied to any positive drug screen result due to possible interfering substances. A more specific alternate chemical method must  be used in order to obtain a confirmed analytical result. Gas chromatography / mass spectrometry (GC/MS) is the preferred confirm                          atory method. Performed at [facility], [address] [place], [place] [address]    Acetaminophen (Tylenol), Serum 12/02/2022 <10 (L)  10 - 30 ug/mL Final   Comment: (NOTE) Therapeutic concentrations vary significantly. A range of 10-30 ug/mL  may be an effective concentration for many patients. However, some  are best treated at concentrations outside of this range. Acetaminophen concentrations >150 ug/mL at 4 hours after ingestion  and >50 ug/mL at 12 hours after ingestion are often associated with  toxic reactions.  Performed at [facility], [address] [place], [place] [address]    Alcohol, Ethyl (B) 12/02/2022 <10  <10 mg/dL Final   Comment: (NOTE) Lowest detectable limit for serum alcohol is 10 mg/dL.  For medical purposes only. Performed at [facility], [address] [place], [place] [address]    Salicylate Lvl 12/02/2022 <7.0 (L)  7.0 - 30.0 mg/dL Final   Performed at [facility], [address] [place], [place] [address]  Admission on 11/17/2022, Discharged on 11/17/2022  Component Date Value Ref Range Status   Sodium 11/17/2022 139  135 - 145 mmol/L Final   Potassium 11/17/2022 4.1  3.5 - 5.1 mmol/L Final   Chloride 11/17/2022 100  98 - 111 mmol/L Final   CO2 11/17/2022 29  22 - 32 mmol/L Final   Glucose, Bld 11/17/2022 90  70 - 99 mg/dL Final   Glucose reference range applies only to samples taken after fasting for at least 8 hours.   BUN 11/17/2022 10  6 - 20 mg/dL Final   Creatinine, Ser 11/17/2022 0.68  0.61 - 1.24 mg/dL Final   Calcium 16/10/9602 9.0  8.9 - 10.3 mg/dL Final   Total Protein 54/09/8117 8.1  6.5 - 8.1 g/dL Final   Albumin 14/78/2956 4.6  3.5 - 5.0 g/dL Final   AST 21/30/8657 22  15 - 41 U/L Final   ALT 11/17/2022 20  0 - 44 U/L Final    Alkaline Phosphatase 11/17/2022 61  38 - 126 U/L Final   Total Bilirubin 11/17/2022 1.1  <1.2 mg/dL Final   GFR, Estimated 11/17/2022 >60  >60 mL/min Final   Comment: (NOTE) Calculated using the CKD-EPI Creatinine Equation (2021)    Anion gap 11/17/2022 10  5 - 15 Final   Performed at [facility], [address] [place], [place] [address]   Alcohol, Ethyl (B) 11/17/2022 <10  <10 mg/dL Final   Comment: (NOTE) Lowest detectable limit for serum alcohol is 10 mg/dL.  For medical purposes only. Performed at [facility], [address] [place], [place] [address]    Salicylate Lvl 11/17/2022 <7.0 (L)  7.0 - 30.0 mg/dL Final   Performed at [facility], [address] [place], [place] [address]   Acetaminophen (Tylenol), Serum 11/17/2022 <10 (L)  10 - 30 ug/mL Final   Comment: (NOTE) Therapeutic concentrations vary significantly. A range of 10-30 ug/mL  may be an effective concentration for many patients. However, some  are best treated at concentrations outside of this range. Acetaminophen concentrations >150 ug/mL at 4 hours after ingestion  and >50 ug/mL at 12 hours after ingestion are often associated with  toxic reactions.  Performed at [facility], [address] [place], [place] [address]    WBC 11/17/2022 7.6  4.0 - 10.5 K/uL Final   RBC 11/17/2022 5.07  4.22 - 5.81 MIL/uL Final   Hemoglobin 11/17/2022 15.2  13.0 - 17.0 g/dL Final   HCT 72/53/6644 46.8  39.0 - 52.0 % Final   MCV 11/17/2022 92.3  80.0 - 100.0 fL Final   MCH 11/17/2022 30.0  26.0 - 34.0 pg Final   MCHC 11/17/2022 32.5  30.0 - 36.0 g/dL Final   RDW 03/47/4259 14.6  11.5 - 15.5 % Final   Platelets 11/17/2022 345  150 - 400 K/uL Final   nRBC 11/17/2022 0.0  0.0 - 0.2 % Final   Performed at [facility], [address] [place], [place] [address]   Tricyclic, Ur Screen 11/17/2022 NONE DETECTED  NONE DETECTED Final   Amphetamines, Ur Screen 11/17/2022 NONE  DETECTED  NONE DETECTED Final   MDMA (Ecstasy)Ur Screen 11/17/2022 NONE DETECTED  NONE DETECTED Final   Cocaine Metabolite,Ur [place] 11/17/2022 POSITIVE (A)  NONE DETECTED Final   Opiate, Ur Screen 11/17/2022 NONE DETECTED  NONE DETECTED Final   Phencyclidine (PCP) Ur S 11/17/2022 NONE DETECTED  NONE DETECTED Final   Cannabinoid 50 Ng, Ur [place] 11/17/2022 POSITIVE (A)  NONE DETECTED Final   Barbiturates, Ur  Screen 11/17/2022 NONE DETECTED  NONE DETECTED Final   Benzodiazepine, Ur Scrn 11/17/2022 POSITIVE (A)  NONE DETECTED Final   Methadone Scn, Ur 11/17/2022 NONE DETECTED  NONE DETECTED Final   Comment: (NOTE) Tricyclics + metabolites, urine    Cutoff 1000 ng/mL Amphetamines + metabolites, urine  Cutoff 1000 ng/mL MDMA (Ecstasy), urine              Cutoff 500 ng/mL Cocaine Metabolite, urine          Cutoff 300 ng/mL Opiate + metabolites, urine        Cutoff 300 ng/mL Phencyclidine (PCP), urine         Cutoff 25 ng/mL Cannabinoid, urine                 Cutoff 50 ng/mL Barbiturates + metabolites, urine  Cutoff 200 ng/mL Benzodiazepine, urine              Cutoff 200 ng/mL Methadone, urine                   Cutoff 300 ng/mL  The urine drug screen provides only a preliminary, unconfirmed analytical test result and should not be used for non-medical purposes. Clinical consideration and professional judgment should be applied to any positive drug screen result due to possible interfering substances. A more specific alternate chemical method must be used in order to obtain a confirmed analytical result. Gas chromatography / mass spectrometry (GC/MS) is the preferred confirm                          atory method. Performed at [facility], [address] [place], [place] [address]   Admission on 11/13/2022, Discharged on 11/13/2022  Component Date Value Ref Range Status   Sodium 11/13/2022 141  135 - 145 mmol/L Final   Potassium 11/13/2022 4.0  3.5 - 5.1 mmol/L Final   Chloride  11/13/2022 108  98 - 111 mmol/L Final   CO2 11/13/2022 26  22 - 32 mmol/L Final   Glucose, Bld 11/13/2022 126 (H)  70 - 99 mg/dL Final   Glucose reference range applies only to samples taken after fasting for at least 8 hours.   BUN 11/13/2022 14  6 - 20 mg/dL Final   Creatinine, Ser 11/13/2022 0.91  0.61 - 1.24 mg/dL Final   Calcium 60/45/4098 9.1  8.9 - 10.3 mg/dL Final   Total Protein 11/91/4782 7.2  6.5 - 8.1 g/dL Final   Albumin 95/62/1308 4.1  3.5 - 5.0 g/dL Final   AST 65/78/4696 30  15 - 41 U/L Final   ALT 11/13/2022 23  0 - 44 U/L Final   Alkaline Phosphatase 11/13/2022 76  38 - 126 U/L Final   Total Bilirubin 11/13/2022 1.9 (H)  <1.2 mg/dL Final   GFR, Estimated 11/13/2022 >60  >60 mL/min Final   Comment: (NOTE) Calculated using the CKD-EPI Creatinine Equation (2021)    Anion gap 11/13/2022 7  5 - 15 Final   Performed at [facility], [address] [place], [place] [address]   Alcohol, Ethyl (B) 11/13/2022 <10  <10 mg/dL Final   Comment: (NOTE) Lowest detectable limit for serum alcohol is 10 mg/dL.  For medical purposes only. Performed at [facility], [address], [place], [place] [address]    Salicylate Lvl 11/13/2022 <7.0 (L)  7.0 - 30.0 mg/dL Final   Performed at [facility], [address]  [place], [place] [address]   Acetaminophen (Tylenol), Serum 11/13/2022 <10 (L)  10 - 30 ug/mL Final   Comment: (NOTE) Therapeutic concentrations vary significantly. A range of 10-30 ug/mL  may be an effective concentration for many patients. However, some  are best treated at concentrations outside of this range. Acetaminophen concentrations >150 ug/mL at 4 hours after ingestion  and >50 ug/mL at 12 hours after ingestion are often associated with  toxic reactions.  Performed at [facility], [address] [place], [place] [address]    WBC 11/13/2022 7.6  4.0 - 10.5 K/uL Final   RBC  11/13/2022 4.70  4.22 - 5.81 MIL/uL Final   Hemoglobin 11/13/2022 14.2  13.0 - 17.0 g/dL Final   HCT 84/69/6295 43.6  39.0 - 52.0 % Final   MCV 11/13/2022 92.8  80.0 - 100.0 fL Final   MCH 11/13/2022 30.2  26.0 - 34.0 pg Final   MCHC 11/13/2022 32.6  30.0 - 36.0 g/dL Final   RDW 28/41/3244 15.0  11.5 - 15.5 % Final   Platelets 11/13/2022 308  150 - 400 K/uL Final   nRBC 11/13/2022 0.0  0.0 - 0.2 % Final   Performed at [facility], [address], [place], [place] [address]   Opiates 11/13/2022 NONE DETECTED  NONE DETECTED Final   Cocaine 11/13/2022 POSITIVE (A)  NONE DETECTED Final   Benzodiazepines 11/13/2022 POSITIVE (A)  NONE DETECTED Final   Amphetamines 11/13/2022 NONE DETECTED  NONE DETECTED Final   Tetrahydrocannabinol 11/13/2022 POSITIVE (A)  NONE DETECTED Final   Barbiturates 11/13/2022 NONE DETECTED  NONE DETECTED Final   Comment: (NOTE) DRUG SCREEN FOR MEDICAL PURPOSES ONLY.  IF CONFIRMATION IS NEEDED FOR ANY PURPOSE, NOTIFY LAB WITHIN 5 DAYS.  LOWEST DETECTABLE LIMITS FOR URINE DRUG SCREEN Drug Class                     Cutoff (ng/mL) Amphetamine and metabolites    1000 Barbiturate and metabolites    200 Benzodiazepine                 200 Opiates and metabolites        300 Cocaine and metabolites        300 THC                            50 Performed at [facility], [address] [place], [place] [address]   Admission on 11/07/2022, Discharged on 11/07/2022  Component Date Value Ref Range Status   Acetaminophen (Tylenol), Serum 11/07/2022 <10 (L)  10 - 30 ug/mL Final   Comment: (NOTE) Therapeutic concentrations vary significantly. A range of 10-30 ug/mL  may be an effective concentration for many patients. However, some  are best treated at concentrations outside of this range. Acetaminophen concentrations >150 ug/mL at 4 hours after ingestion  and >50 ug/mL at 12 hours after ingestion are often associated with  toxic  reactions.  Performed at [facility], [address] [place], [place] [address]    Sodium 11/07/2022 137  135 - 145 mmol/L Final   Potassium 11/07/2022 3.7  3.5 - 5.1 mmol/L Final   Chloride 11/07/2022 107  98 - 111 mmol/L Final   CO2 11/07/2022 22  22 - 32 mmol/L Final   Glucose, Bld 11/07/2022 147 (H)  70 - 99 mg/dL Final   Glucose reference range applies only to samples taken after fasting  for at least 8 hours.   BUN 11/07/2022 18  6 - 20 mg/dL Final   Creatinine, Ser 11/07/2022 0.93  0.61 - 1.24 mg/dL Final   Calcium 16/10/9602 8.5 (L)  8.9 - 10.3 mg/dL Final   Total Protein 54/09/8117 6.5  6.5 - 8.1 g/dL Final   Albumin 14/78/2956 3.5  3.5 - 5.0 g/dL Final   AST 21/30/8657 24  15 - 41 U/L Final   ALT 11/07/2022 21  0 - 44 U/L Final   Alkaline Phosphatase 11/07/2022 65  38 - 126 U/L Final   Total Bilirubin 11/07/2022 1.0  0.3 - 1.2 mg/dL Final   GFR, Estimated 11/07/2022 >60  >60 mL/min Final   Comment: (NOTE) Calculated using the CKD-EPI Creatinine Equation (2021)    Anion gap 11/07/2022 8  5 - 15 Final   Performed at [facility], [address] [place], [place] [address]   Alcohol, Ethyl (B) 11/07/2022 <10  <10 mg/dL Final   Comment: (NOTE) Lowest detectable limit for serum alcohol is 10 mg/dL.  For medical purposes only. Performed at [facility], [address] [place], [place] [address]    Salicylate Lvl 11/07/2022 <7.0 (L)  7.0 - 30.0 mg/dL Final   Performed at [facility], [address] [place], [place] [address]   WBC 11/07/2022 10.9 (H)  4.0 - 10.5 K/uL Final   RBC 11/07/2022 3.91 (L)  4.22 - 5.81 MIL/uL Final   Hemoglobin 11/07/2022 11.8 (L)  13.0 - 17.0 g/dL Final   HCT 40/10/2723 35.3 (L)  39.0 - 52.0 % Final   MCV 11/07/2022 90.3  80.0 - 100.0 fL Final   MCH 11/07/2022 30.2  26.0 - 34.0 pg Final   MCHC 11/07/2022 33.4  30.0 - 36.0 g/dL Final   RDW 36/64/4034 14.9  11.5 - 15.5 % Final   Platelets 11/07/2022  254  150 - 400 K/uL Final   nRBC 11/07/2022 0.0  0.0 - 0.2 % Final   Neutrophils Relative % 11/07/2022 84  % Final   Neutro Abs 11/07/2022 9.1 (H)  1.7 - 7.7 K/uL Final   Lymphocytes Relative 11/07/2022 7  % Final   Lymphs Abs 11/07/2022 0.8  0.7 - 4.0 K/uL Final   Monocytes Relative 11/07/2022 6  % Final   Monocytes Absolute 11/07/2022 0.6  0.1 - 1.0 K/uL Final   Eosinophils Relative 11/07/2022 2  % Final   Eosinophils Absolute 11/07/2022 0.2  0.0 - 0.5 K/uL Final   Basophils Relative 11/07/2022 1  % Final   Basophils Absolute 11/07/2022 0.1  0.0 - 0.1 K/uL Final   Immature Granulocytes 11/07/2022 0  % Final   Abs Immature Granulocytes 11/07/2022 0.04  0.00 - 0.07 K/uL Final   Performed at [facility], [address], [place], [place] [address]  Admission on 10/27/2022, Discharged on 11/01/2022  Component Date Value Ref Range Status   Cholesterol 10/31/2022 203 (H)  0 - 200 mg/dL Final   Triglycerides 56/38/7564 196 (H)  <150 mg/dL Final   HDL 33/29/5188 84  >40 mg/dL Final   Total CHOL/HDL Ratio 10/31/2022 2.4  RATIO Final   VLDL 10/31/2022 39  0 - 40 mg/dL Final   LDL Cholesterol 10/31/2022 80  0 - 99 mg/dL Final   Comment:        Total Cholesterol/HDL:CHD Risk Coronary Heart Disease Risk Table                     Men  Women  1/2 Average Risk   3.4   3.3  Average Risk       5.0   4.4  2 X Average Risk   9.6   7.1  3 X Average Risk  23.4   11.0        Use the calculated Patient Ratio above and the CHD Risk Table to determine the patient's CHD Risk.        ATP III CLASSIFICATION (LDL):  <100     mg/dL   Optimal  478-295  mg/dL   Near or Above                    Optimal  130-159  mg/dL   Borderline  621-308  mg/dL   High  >657     mg/dL   Very High Performed at [facility], [address] [place], [place] [address]   Admission on 10/26/2022, Discharged on 10/27/2022  Component Date Value Ref Range Status   WBC 10/26/2022 9.6  4.0 - 10.5 K/uL  Final   RBC 10/26/2022 4.98  4.22 - 5.81 MIL/uL Final   Hemoglobin 10/26/2022 14.7  13.0 - 17.0 g/dL Final   HCT 29/52/8413 44.1  39.0 - 52.0 % Final   MCV 10/26/2022 88.6  80.0 - 100.0 fL Final   MCH 10/26/2022 29.5  26.0 - 34.0 pg Final   MCHC 10/26/2022 33.3  30.0 - 36.0 g/dL Final   RDW 24/40/1027 14.3  11.5 - 15.5 % Final   Platelets 10/26/2022 378  150 - 400 K/uL Final   nRBC 10/26/2022 0.0  0.0 - 0.2 % Final   Performed at [facility], [address] [place], [place] [address]   Sodium 10/26/2022 135  135 - 145 mmol/L Final   Potassium 10/26/2022 4.1  3.5 - 5.1 mmol/L Final   Chloride 10/26/2022 101  98 - 111 mmol/L Final   CO2 10/26/2022 23  22 - 32 mmol/L Final   Glucose, Bld 10/26/2022 93  70 - 99 mg/dL Final   Glucose reference range applies only to samples taken after fasting for at least 8 hours.   BUN 10/26/2022 15  6 - 20 mg/dL Final   Creatinine, Ser 10/26/2022 0.89  0.61 - 1.24 mg/dL Final   Calcium 44/03/4740 9.2  8.9 - 10.3 mg/dL Final   GFR, Estimated 10/26/2022 >60  >60 mL/min Final   Comment: (NOTE) Calculated using the CKD-EPI Creatinine Equation (2021)    Anion gap 10/26/2022 11  5 - 15 Final   Performed at [facility], [address] [place], [place] [address]   Troponin I (High Sensitivity) 10/26/2022 4  <18 ng/L Final   Comment: (NOTE) Elevated high sensitivity troponin I (hsTnI) values and significant  changes across serial measurements may suggest ACS but many other  chronic and acute conditions are known to elevate hsTnI results.  Refer to the "Links" section for chest pain algorithms and additional  guidance. Performed at [facility], [address] [place], [place] [address]    Acetaminophen (Tylenol), Serum 10/26/2022 <10 (L)  10 - 30 ug/mL Final   Comment: (NOTE) Therapeutic concentrations vary significantly. A range of 10-30 ug/mL  may be an effective concentration for many patients. However, some  are best  treated at concentrations outside of this range. Acetaminophen concentrations >150 ug/mL at 4 hours after ingestion  and >50 ug/mL at 12 hours after ingestion are often associated with  toxic reactions.  Performed  at [facility], [address] [place], [place] [address]    Salicylate Lvl 10/26/2022 <7.0 (L)  7.0 - 30.0 mg/dL Final   Performed at [facility] [address] [place], [place] [address]   Alcohol, Ethyl (B) 10/26/2022 <10  <10 mg/dL Final   Comment: (NOTE) Lowest detectable limit for serum alcohol is 10 mg/dL.  For medical purposes only. Performed at [facility], [address] [place], [place] [address]    SARS Coronavirus 2 by RT PCR 10/26/2022 NEGATIVE  NEGATIVE Final   Comment: (NOTE) SARS-CoV-2 target nucleic acids are NOT DETECTED.  The SARS-CoV-2 RNA is generally detectable in upper respiratory specimens during the acute phase of infection. The lowest concentration of SARS-CoV-2 viral copies this assay can detect is 138 copies/mL. A negative result does not preclude SARS-Cov-2 infection and should not be used as the sole basis for treatment or other patient management decisions. A negative result may occur with  improper specimen collection/handling, submission of specimen other than nasopharyngeal swab, presence of viral mutation(s) within the areas targeted by this assay, and inadequate number of viral copies(<138 copies/mL). A negative result must be combined with clinical observations, patient history, and epidemiological information. The expected result is Negative.  Fact Sheet for Patients:  BloggerCourse.com  Fact Sheet for Healthcare Providers:  SeriousBroker.it  This test is no                          t yet approved or cleared by the [place] FDA and  has been authorized for detection and/or diagnosis of SARS-CoV-2 by FDA under an Emergency Use  Authorization (EUA). This EUA will remain  in effect (meaning this test can be used) for the duration of the COVID-19 declaration under Section 564(b)(1) of the Act, 21 U.S.C.section 360bbb-3(b)(1), unless the authorization is terminated  or revoked sooner.       Influenza A by PCR 10/26/2022 NEGATIVE  NEGATIVE Final   Influenza B by PCR 10/26/2022 NEGATIVE  NEGATIVE Final   Comment: (NOTE) The Xpert Xpress SARS-CoV-2/FLU/RSV plus assay is intended as an aid in the diagnosis of influenza from Nasopharyngeal swab specimens and should not be used as a sole basis for treatment. Nasal washings and aspirates are unacceptable for Xpert Xpress SARS-CoV-2/FLU/RSV testing.  Fact Sheet for Patients: BloggerCourse.com  Fact Sheet for Healthcare Providers: SeriousBroker.it  This test is not yet approved or cleared by the [place] FDA and has been authorized for detection and/or diagnosis of SARS-CoV-2 by FDA under an Emergency Use Authorization (EUA). This EUA will remain in effect (meaning this test can be used) for the duration of the COVID-19 declaration under Section 564(b)(1) of the Act, 21 U.S.C. section 360bbb-3(b)(1), unless the authorization is terminated or revoked.     Resp Syncytial Virus by PCR 10/26/2022 NEGATIVE  NEGATIVE Final   Comment: (NOTE) Fact Sheet for Patients: BloggerCourse.com  Fact Sheet for Healthcare Providers: SeriousBroker.it  This test is not yet approved or cleared by the [place] FDA and has been authorized for detection and/or diagnosis of SARS-CoV-2 by FDA under an Emergency Use Authorization (EUA). This EUA will remain in effect (meaning this test can be used) for the duration of the COVID-19 declaration under Section 564(b)(1) of the Act, 21 U.S.C. section 360bbb-3(b)(1), unless the authorization is terminated or revoked.  Performed at  [facility], [address] [place], [place] [address]    Troponin I (High Sensitivity) 10/26/2022  5  <18 ng/L Final   Comment: (NOTE) Elevated high sensitivity troponin I (hsTnI) values and significant  changes across serial measurements may suggest ACS but many other  chronic and acute conditions are known to elevate hsTnI results.  Refer to the "Links" section for chest pain algorithms and additional  guidance. Performed at [facility], [address] [place], [place] [address]    Tricyclic, Ur Screen 10/26/2022 NONE DETECTED  NONE DETECTED Final   Amphetamines, Ur Screen 10/26/2022 NONE DETECTED  NONE DETECTED Final   MDMA (Ecstasy)Ur Screen 10/26/2022 NONE DETECTED  NONE DETECTED Final   Cocaine Metabolite,Ur Rake 10/26/2022 POSITIVE (A)  NONE DETECTED Final   Opiate, Ur Screen 10/26/2022 NONE DETECTED  NONE DETECTED Final   Phencyclidine (PCP) Ur S 10/26/2022 NONE DETECTED  NONE DETECTED Final   Cannabinoid 50 Ng, Ur [place] 10/26/2022 NONE DETECTED  NONE DETECTED Final   Barbiturates, Ur Screen 10/26/2022 NONE DETECTED  NONE DETECTED Final   Benzodiazepine, Ur Scrn 10/26/2022 POSITIVE (A)  NONE DETECTED Final   Methadone Scn, Ur 10/26/2022 NONE DETECTED  NONE DETECTED Final   Comment: (NOTE) Tricyclics + metabolites, urine    Cutoff 1000 ng/mL Amphetamines + metabolites, urine  Cutoff 1000 ng/mL MDMA (Ecstasy), urine              Cutoff 500 ng/mL Cocaine Metabolite, urine          Cutoff 300 ng/mL Opiate + metabolites, urine        Cutoff 300 ng/mL Phencyclidine (PCP), urine         Cutoff 25 ng/mL Cannabinoid, urine                 Cutoff 50 ng/mL Barbiturates + metabolites, urine  Cutoff 200 ng/mL Benzodiazepine, urine              Cutoff 200 ng/mL Methadone, urine                   Cutoff 300 ng/mL  The urine drug screen provides only a preliminary, unconfirmed analytical test result and should not be used for non-medical purposes. Clinical  consideration and professional judgment should be applied to any positive drug screen result due to possible interfering substances. A more specific alternate chemical method must be used in order to obtain a confirmed analytical result. Gas chromatography / mass spectrometry (GC/MS) is the preferred confirm                          atory method. Performed at [facility], [address] [place], [place] [address]   Admission on 10/22/2022, Discharged on 10/24/2022  Component Date Value Ref Range Status   Sodium 10/22/2022 139  135 - 145 mmol/L Final   Potassium 10/22/2022 3.5  3.5 - 5.1 mmol/L Final   Chloride 10/22/2022 104  98 - 111 mmol/L Final   CO2 10/22/2022 27  22 - 32 mmol/L Final   Glucose, Bld 10/22/2022 128 (H)  70 - 99 mg/dL Final   Glucose reference range applies only to samples taken after fasting for at least 8 hours.   BUN 10/22/2022 14  6 - 20 mg/dL Final   Creatinine, Ser 10/22/2022 0.76  0.61 - 1.24 mg/dL Final   Calcium 84/13/2440 8.9  8.9 - 10.3 mg/dL Final   Total Protein 10/31/2534 7.8  6.5 - 8.1 g/dL Final   Albumin 64/40/3474 4.4  3.5 - 5.0 g/dL Final   AST  10/22/2022 22  15 - 41 U/L Final   ALT 10/22/2022 19  0 - 44 U/L Final   Alkaline Phosphatase 10/22/2022 69  38 - 126 U/L Final   Total Bilirubin 10/22/2022 1.1  0.3 - 1.2 mg/dL Final   GFR, Estimated 10/22/2022 >60  >60 mL/min Final   Comment: (NOTE) Calculated using the CKD-EPI Creatinine Equation (2021)    Anion gap 10/22/2022 8  5 - 15 Final   Performed at [facility], [address] [place], [place] [address]   WBC 10/22/2022 11.9 (H)  4.0 - 10.5 K/uL Final   RBC 10/22/2022 4.85  4.22 - 5.81 MIL/uL Final   Hemoglobin 10/22/2022 14.4  13.0 - 17.0 g/dL Final   HCT 60/45/4098 44.3  39.0 - 52.0 % Final   MCV 10/22/2022 91.3  80.0 - 100.0 fL Final   MCH 10/22/2022 29.7  26.0 - 34.0 pg Final   MCHC 10/22/2022 32.5  30.0 - 36.0 g/dL Final   RDW 11/91/4782 13.9  11.5 - 15.5 %  Final   Platelets 10/22/2022 279  150 - 400 K/uL Final   nRBC 10/22/2022 0.0  0.0 - 0.2 % Final   Neutrophils Relative % 10/22/2022 72  % Final   Neutro Abs 10/22/2022 8.5 (H)  1.7 - 7.7 K/uL Final   Lymphocytes Relative 10/22/2022 14  % Final   Lymphs Abs 10/22/2022 1.7  0.7 - 4.0 K/uL Final   Monocytes Relative 10/22/2022 8  % Final   Monocytes Absolute 10/22/2022 1.0  0.1 - 1.0 K/uL Final   Eosinophils Relative 10/22/2022 5  % Final   Eosinophils Absolute 10/22/2022 0.7 (H)  0.0 - 0.5 K/uL Final   Basophils Relative 10/22/2022 1  % Final   Basophils Absolute 10/22/2022 0.1  0.0 - 0.1 K/uL Final   Immature Granulocytes 10/22/2022 0  % Final   Abs Immature Granulocytes 10/22/2022 0.04  0.00 - 0.07 K/uL Final   Performed at [facility], [address] [place], [place] [address]   Troponin I (High Sensitivity) 10/22/2022 5  <18 ng/L Final   Comment: (NOTE) Elevated high sensitivity troponin I (hsTnI) values and significant  changes across serial measurements may suggest ACS but many other  chronic and acute conditions are known to elevate hsTnI results.  Refer to the "Links" section for chest pain algorithms and additional  guidance. Performed at [facility], [address] [place], [place] [address]    Troponin I (High Sensitivity) 10/22/2022 5  <18 ng/L Final   Comment: (NOTE) Elevated high sensitivity troponin I (hsTnI) values and significant  changes across serial measurements may suggest ACS but many other  chronic and acute conditions are known to elevate hsTnI results.  Refer to the "Links" section for chest pain algorithms and additional  guidance. Performed at [facility], [address] [place], [place] [address]    pH, Ven 10/22/2022 7.34  7.25 - 7.43 Final   pCO2, Ven 10/22/2022 57  44 - 60 mmHg Final   pO2, Ven 10/22/2022 37  32 - 45 mmHg Final   Bicarbonate 10/22/2022 30.8 (H)  20.0 - 28.0 mmol/L Final   Acid-Base Excess  10/22/2022 3.5 (H)  0.0 - 2.0 mmol/L Final   O2 Saturation 10/22/2022 54.4  % Final   Patient temperature 10/22/2022 37.0   Final   Collection site 10/22/2022 VEIN   Final   Performed at [facility], [address], [place], [place] [address]   Glucose-Capillary 10/22/2022  198 (H)  70 - 99 mg/dL Final   Glucose reference range applies only to samples taken after fasting for at least 8 hours.   O2 Content 10/22/2022 4.0  L/min Final   Delivery systems 10/22/2022 NASAL CANNULA   Final   pH, Arterial 10/22/2022 7.41  7.35 - 7.45 Final   pCO2 arterial 10/22/2022 40  32 - 48 mmHg Final   pO2, Arterial 10/22/2022 124 (H)  83 - 108 mmHg Final   Bicarbonate 10/22/2022 25.4  20.0 - 28.0 mmol/L Final   Acid-Base Excess 10/22/2022 0.7  0.0 - 2.0 mmol/L Final   O2 Saturation 10/22/2022 99.3  % Final   Patient temperature 10/22/2022 37.0   Final   Collection site 10/22/2022 RIGHT RADIAL   Final   Allens test (pass/fail) 10/22/2022 PASS  PASS Final   Performed at [facility], [address] [place], [place] [address]  Admission on 09/24/2022, Discharged on 09/24/2022  Component Date Value Ref Range Status   Troponin I (High Sensitivity) 09/24/2022 5  <18 ng/L Final   Comment: (NOTE) Elevated high sensitivity troponin I (hsTnI) values and significant  changes across serial measurements may suggest ACS but many other  chronic and acute conditions are known to elevate hsTnI results.  Refer to the "Links" section for chest pain algorithms and additional  guidance. Performed at [facility], [address] [place], [place] [address]    SARS Coronavirus 2 by RT PCR 09/24/2022 NEGATIVE  NEGATIVE Final   Influenza A by PCR 09/24/2022 NEGATIVE  NEGATIVE Final   Influenza B by PCR 09/24/2022 NEGATIVE  NEGATIVE Final   Comment: (NOTE) The Xpert Xpress SARS-CoV-2/FLU/RSV plus assay is intended as an aid in the diagnosis of influenza from Nasopharyngeal swab specimens  and should not be used as a sole basis for treatment. Nasal washings and aspirates are unacceptable for Xpert Xpress SARS-CoV-2/FLU/RSV testing.  Fact Sheet for Patients: BloggerCourse.com  Fact Sheet for Healthcare Providers: SeriousBroker.it  This test is not yet approved or cleared by the [place] FDA and has been authorized for detection and/or diagnosis of SARS-CoV-2 by FDA under an Emergency Use Authorization (EUA). This EUA will remain in effect (meaning this test can be used) for the duration of the COVID-19 declaration under Section 564(b)(1) of the Act, 21 U.S.C. section 360bbb-3(b)(1), unless the authorization is terminated or revoked.     Resp Syncytial Virus by PCR 09/24/2022 NEGATIVE  NEGATIVE Final   Comment: (NOTE) Fact Sheet for Patients: BloggerCourse.com  Fact Sheet for Healthcare Providers: SeriousBroker.it  This test is not yet approved or cleared by the [place] FDA and has been authorized for detection and/or diagnosis of SARS-CoV-2 by FDA under an Emergency Use Authorization (EUA). This EUA will remain in effect (meaning this test can be used) for the duration of the COVID-19 declaration under Section 564(b)(1) of the Act, 21 U.S.C. section 360bbb-3(b)(1), unless the authorization is terminated or revoked.  Performed at [facility], [address] [place], [place] [address]    Sodium 09/24/2022 139  135 - 145 mmol/L Final   Potassium 09/24/2022 4.0  3.5 - 5.1 mmol/L Final   HEMOLYSIS AT THIS LEVEL MAY AFFECT RESULT   Chloride 09/24/2022 101  98 - 111 mmol/L Final   CO2 09/24/2022 27  22 - 32 mmol/L Final   Glucose, Bld 09/24/2022 121 (H)  70 - 99 mg/dL Final   Glucose reference range applies only to samples taken after fasting for at  least 8 hours.   BUN 09/24/2022 10  6 - 20 mg/dL Final   Creatinine, Ser 09/24/2022 0.98  0.61 -  1.24 mg/dL Final   Calcium 78/29/5621 9.1  8.9 - 10.3 mg/dL Final   GFR, Estimated 09/24/2022 >60  >60 mL/min Final   Comment: (NOTE) Calculated using the CKD-EPI Creatinine Equation (2021)    Anion gap 09/24/2022 11  5 - 15 Final   Performed at [facility], [address] [place], [place] [address]   WBC 09/24/2022 8.0  4.0 - 10.5 K/uL Final   RBC 09/24/2022 4.78  4.22 - 5.81 MIL/uL Final   Hemoglobin 09/24/2022 14.3  13.0 - 17.0 g/dL Final   HCT 78/46/9629 42.7  39.0 - 52.0 % Final   MCV 09/24/2022 89.3  80.0 - 100.0 fL Final   MCH 09/24/2022 29.9  26.0 - 34.0 pg Final   MCHC 09/24/2022 33.5  30.0 - 36.0 g/dL Final   RDW 52/84/1324 14.0  11.5 - 15.5 % Final   Platelets 09/24/2022 333  150 - 400 K/uL Final   nRBC 09/24/2022 0.0  0.0 - 0.2 % Final   Neutrophils Relative % 09/24/2022 61  % Final   Neutro Abs 09/24/2022 4.8  1.7 - 7.7 K/uL Final   Lymphocytes Relative 09/24/2022 27  % Final   Lymphs Abs 09/24/2022 2.2  0.7 - 4.0 K/uL Final   Monocytes Relative 09/24/2022 8  % Final   Monocytes Absolute 09/24/2022 0.7  0.1 - 1.0 K/uL Final   Eosinophils Relative 09/24/2022 3  % Final   Eosinophils Absolute 09/24/2022 0.3  0.0 - 0.5 K/uL Final   Basophils Relative 09/24/2022 1  % Final   Basophils Absolute 09/24/2022 0.1  0.0 - 0.1 K/uL Final   Immature Granulocytes 09/24/2022 0  % Final   Abs Immature Granulocytes 09/24/2022 0.02  0.00 - 0.07 K/uL Final   Performed at [facility], [address] [place], [place] [address]  Office Visit on 07/12/2022  Component Date Value Ref Range Status   Hemoglobin A1C 07/12/2022 5.9 (A)  4.0 - 5.6 % Final  Admission on 06/30/2022, Discharged on 07/01/2022  Component Date Value Ref Range Status   WBC 06/30/2022 8.9  4.0 - 10.5 K/uL Final   RBC 06/30/2022 4.26  4.22 - 5.81 MIL/uL Final   Hemoglobin 06/30/2022 12.7 (L)  13.0 - 17.0 g/dL Final   HCT 72/53/6644 39.6  39.0 - 52.0 % Final   MCV 06/30/2022 93.0  80.0 - 100.0 fL Final    MCH 06/30/2022 29.8  26.0 - 34.0 pg Final   MCHC 06/30/2022 32.1  30.0 - 36.0 g/dL Final   RDW 03/47/4259 14.8  11.5 - 15.5 % Final   Platelets 06/30/2022 234  150 - 400 K/uL Final   nRBC 06/30/2022 0.0  0.0 - 0.2 % Final   Performed at [facility], [address], [place], [place] [address]   Sodium 06/30/2022 140  135 - 145 mmol/L Final   Potassium 06/30/2022 3.8  3.5 - 5.1 mmol/L Final   Chloride 06/30/2022 107  98 - 111 mmol/L Final   CO2 06/30/2022 27  22 - 32 mmol/L Final   Glucose, Bld 06/30/2022 108 (H)  70 - 99 mg/dL Final   Glucose reference range applies only to samples taken after fasting for at least 8 hours.   BUN 06/30/2022 28 (H)  6 - 20 mg/dL Final   Creatinine, Ser 06/30/2022 1.18  0.61 -  1.24 mg/dL Final   Calcium 07/04/1599 9.0  8.9 - 10.3 mg/dL Final   GFR, Estimated 06/30/2022 >60  >60 mL/min Final   Comment: (NOTE) Calculated using the CKD-EPI Creatinine Equation (2021)    Anion gap 06/30/2022 6  5 - 15 Final   Performed at [facility], [address], [place], [place] [address]   Sodium 06/30/2022 140  135 - 145 mmol/L Final   Potassium 06/30/2022 3.7  3.5 - 5.1 mmol/L Final   Chloride 06/30/2022 105  98 - 111 mmol/L Final   BUN 06/30/2022 28 (H)  6 - 20 mg/dL Final   Creatinine, Ser 06/30/2022 1.10  0.61 - 1.24 mg/dL Final   Glucose, Bld 55/73/2202 147 (H)  70 - 99 mg/dL Final   Glucose reference range applies only to samples taken after fasting for at least 8 hours.   Calcium, Ion 06/30/2022 1.24  1.15 - 1.40 mmol/L Final   TCO2 06/30/2022 28  22 - 32 mmol/L Final   Hemoglobin 06/30/2022 12.6 (L)  13.0 - 17.0 g/dL Final   HCT 54/27/0623 37.0 (L)  39.0 - 52.0 % Final   B Natriuretic Peptide 06/30/2022 47.7  0.0 - 100.0 pg/mL Final   Performed at [facility], [address], [place], [place] [address]   Troponin I (High Sensitivity) 07/01/2022 4  <18 ng/L Final   Comment: (NOTE) Elevated high  sensitivity troponin I (hsTnI) values and significant  changes across serial measurements may suggest ACS but many other  chronic and acute conditions are known to elevate hsTnI results.  Refer to the "Links" section for chest pain algorithms and additional  guidance. Performed at [facility], [address] [place], [place] [address]   There may be more visits with results that are not included.    Blood Alcohol level:  Lab Results  Component Value Date   ETH <10 12/02/2022   ETH <10 11/17/2022    Metabolic Disorder Labs: Lab Results  Component Value Date   HGBA1C 5.9 (A) 07/12/2022   MPG 117 02/02/2009   No results found for: "PROLACTIN" Lab Results  Component Value Date   CHOL 203 (H) 10/31/2022   TRIG 196 (H) 10/31/2022   HDL 84 10/31/2022   CHOLHDL 2.4 10/31/2022   VLDL 39 10/31/2022   LDLCALC 80 10/31/2022   LDLCALC 63 10/08/2014    Therapeutic Lab Levels: No results found for: "LITHIUM" No results found for: "VALPROATE" No results found for: "CBMZ"  Physical Findings   AIMS    Flowsheet Row Admission (Discharged) from 02/02/2016 [facility] Admission (Discharged) from 03/22/2015 in [facility] Admission (Discharged) from 10/05/2014 in [facility] INPATIENT BEHAV[facility]AL MEDICINE Admission (Discharged) [facility]  AIMS Total Score 0 0 0 0      AUDIT    Flowsheet Row Admission (Discharged) from 11/17/2022 in [facility] INPATIENT BE[facility]AVIORAL MEDICINE Admission (Discharged) from 10/27/2022 in [facility] INPATIENT BEHAVIORAL MEDICINE Admission (Discharged) from 03/22/2015 in [facility] Admission (Discharged) from 10/05/2014 in [facility] INPATIENT BEHAVIORAL MEDICINE Admission (Discharged) from 10/01/2014 in [facility]  Alcohol Use Disorder Identification Test Final Score (AUDIT) 14 14 25 18  34      PHQ2-9    Flowsheet Row  ED from 12/03/2022 in [facility]  PHQ-2 Total Score 6  PHQ-9 Total Score 27      Flowsheet Row ED from 12/03/2022 in [facility] ED from 12/02/2022 in [facility] Emergency Department at [facility] Admission (Discharged) from 11/17/2022 in [facility] INPATIENT BEHAVIORAL MEDICINE  C-SSRS RISK CATEGORY No Risk Error: Q3, 4, or 5 should not be populated when Q2 is No Moderate Risk        Musculoskeletal  Strength & Muscle Tone: within normal limits Gait & Station: normal Patient leans: N/A  Psychiatric Specialty Exam  Presentation  General Appearance:  Appropriate for Environment  Eye Contact: Fair  Speech: Clear and Coherent; Normal Rate  Speech Volume: Normal  Handedness: Right   Mood and Affect  Mood: Depressed  Affect: Depressed; Congruent   Thought Process  Thought Processes: Coherent; Linear  Descriptions of Associations:Intact  Orientation:Full (Time, Place and Person)  Thought Content:Logical; WDL  Diagnosis of Schizophrenia or Schizoaffective disorder in past: No  Duration of Psychotic Symptoms: Greater than six months   Hallucinations:Hallucinations: None  Ideas of Reference:None  Suicidal Thoughts:Suicidal Thoughts: No  Homicidal Thoughts:Homicidal Thoughts: No   Sensorium  Memory: Remote Good  Judgment: Fair  Insight: Fair   Art therapist  Concentration: Good  Attention Span: Good  Recall: Good  Fund of Knowledge: Good  Language: Good   Psychomotor Activity  Psychomotor Activity:Psychomotor Activity: Normal AIMS Completed?: No   Assets  Assets: Desire for Improvement; Resilience   Sleep  Sleep:Sleep: Poor Number of Hours of Sleep: 8   Nutritional Assessment (For OBS and [facility] admissions only) Has the patient had a weight loss or gain of 10 pounds or more in the last 3 months?: No Has the patient had a decrease in food intake/or  appetite?: No Does the patient have dental problems?: No Does the patient have eating habits or behaviors that may be indicators of an eating disorder including binging or inducing vomiting?: No Has the patient recently lost weight without trying?: 0 Has the patient been eating poorly because of a decreased appetite?: 0 Malnutrition Screening Tool Score: 0    Physical Exam  Physical Exam Vitals and nursing note reviewed.  Neurological:     Mental Status: He is alert.  Psychiatric:        Mood and Affect: Mood normal.        Thought Content: Thought content normal.    Review of Systems  Psychiatric/Behavioral:  Positive for depression and substance abuse. The patient is nervous/anxious.    Blood pressure 126/68, pulse 82, temperature 98.2 F (36.8 C), temperature source Oral, resp. rate 17, SpO2 100%. There is no height or weight on file to calculate BMI.  Treatment Plan Summary: Daily contact with patient to assess and evaluate symptoms and progress in treatment and Medication management  Alcohol Abuse/Use Disorder Alcohol Withdrawal -Ativan taper- ends 12/3 -CIWA with Ativan as needed for CIWA greater than 10  -Last CIWA score is 2 on 12/2  -Multivitamin with minerals daily -Tylenol 650 mg every 6 hours as needed for pain -Zofran 4 mg every 6 hours as needed for nausea or vomiting -Imodium 2 to 4 mg as needed for diarrhea or loose stools  -Maalox/Mylanta 30 mL every 4 hours as needed for indigestion -Milk of Mag 30 mL as needed for constipation   Cocaine abuse/misuse -Encourage abstinence  Substance-induced mood d/o Restarted Prozac 20 mg daily Restarted Risperdal 1 mg nightly Continue trazodone 50 mg nightly as needed   CSW to start working on discharge disposition F/u patient reported he is already been in contact with [facility] for admission acceptance Patient encouraged to participate in therapeutic milieu  Lance Muss, MD 12/06/2022 9:04 AM

## 2022-12-07 DIAGNOSIS — F1414 Cocaine abuse with cocaine-induced mood disorder: Secondary | ICD-10-CM | POA: Diagnosis not present

## 2022-12-07 DIAGNOSIS — F32A Depression, unspecified: Secondary | ICD-10-CM | POA: Diagnosis not present

## 2022-12-07 NOTE — Group Note (Signed)
Group Topic: Relapse and Recovery  Group Date: 12/07/2022 Start Time: 1730 End Time: 1740 Facilitators: Andrell Tallman, Jacklynn Barnacle, RN  Department: [facility]  Number of Participants: 7  Group Focus: discharge education Treatment Modality:  Individual Therapy Interventions utilized were assignment and support Purpose: suicide safety planning  Name: Franklin [name] Date of Birth: March 04, 1970  MR: [id number]    Level of Participation: active Quality of Participation: attentive Interactions with others: n/a - individualized therapy Mood/Affect: appropriate Triggers (if applicable): identified on form Cognition: coherent/clear Progress: Gaining insight Response: patient in room with form stating they will return form completed to staff, patient pleasant and engaged in conversation regarding importance of form Plan: patient will be encouraged to refer back to suicide safety plan as needed, reach out to support system, or a professional if neccessary  Patients Problems:  Patient Active Problem List   Diagnosis Date Noted   Cocaine abuse with cocaine-induced mood disorder (HCC) 12/03/2022   Major depressive disorder, 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

## 2022-12-07 NOTE — ED Notes (Signed)
 Pt was provided lunch

## 2022-12-07 NOTE — ED Notes (Signed)
Patient sitting in dayroom interacting with peers. No acute distress noted. No concerns voiced. Informed patient to notify staff with any needs or assistance. Patient verbalized understanding or agreement. Safety checks in place per facility policy.

## 2022-12-07 NOTE — ED Notes (Addendum)
Patient observed/assessed at nursing station. Patient alert and oriented x 4. Affect is blunted. Patient denies pain and anxiety. He denies A/V/H. He denies having any thoughts/plan of self harm and harm towards others. Fluid and snack offered. Patient states that appetite has been good throughout the day. Verbalizes no further complaints at this time. Will continue to monitor and support.  

## 2022-12-07 NOTE — ED Notes (Addendum)
Patient alert & oriented x4. Denies intent to harm self or others when asked. Denies A/VH. Patient reports pain in feet at this time, denying medication at this time. No acute distress noted. Support and encouragement provided. Routine safety checks conducted per facility protocol. Encouraged patient to notify staff if any thoughts of harm towards self or others arise. Patient verbalizes understanding and agreement.  ** edited due to wrong patient noted inputted

## 2022-12-07 NOTE — ED Notes (Signed)
Patient resting with eyes closed in no apparent acute distress. Respirations even and unlabored. Environment secured. Safety checks in place according to facility policy.

## 2022-12-07 NOTE — ED Notes (Signed)
Pt sleeping at present, no distress noted.  Monitoring for safety. 

## 2022-12-07 NOTE — ED Notes (Signed)
Patient is sleeping. Respirations equal and unlabored, skin warm and dry. No change in assessment or acuity. Routine safety checks conducted according to facility protocol. Will continue to monitor for safety.   

## 2022-12-07 NOTE — ED Notes (Signed)
Pt was provided dinner.

## 2022-12-07 NOTE — ED Provider Notes (Addendum)
Behavioral Health Progress Note  Date and Time: 12/07/2022 8:49 AM Name: Franklin [name] MRN:  [id number]  Subjective:  Franklin [name] is a 52 year old male with a past psychiatric history significant for alcohol use disorder and cocaine abuse with cocaine induced mood disorder who presents to [facility] from [facility] with a chief complaint of cocaine abuse with cocaine induced mood disorder.   Patient seen in his room, no acute distress. Patient reports feeling "good" today. Patient reports good sleep and good appetite. Regarding withdrawal symptoms, he denies. Regarding cravings, he denies. Patient denies current SI, HI, and AVH. Regarding discharge plans, he continues to wants to go to [facility] and states that he wants to go to that particular location because has been there before and states that it is very different than the [facility]. I discussed how social work is still working on placement.   Diagnosis:  Final diagnoses:  Cocaine abuse with cocaine-induced mood disorder (HCC)    Total Time spent with patient: 15 minutes  Past Psychiatric History: Alcohol use disorder Cocaine use disorder Substance abuse disorder Major depressive disorder, recurrent severe, without psychotic features Generalized anxiety disorder Past Medical History: Chronic obstructive pulmonary disease Premature ventricular contraction Solitary pulmonary nodule Prediabetes Family History: Patient has a family history of hypertension and diabetes Family Psychiatric  History:  Social History:  No pertinent social history  Additional Social History:        Sleep: Good  Appetite:  Good  Current Medications:  Current Facility-Administered Medications  Medication Dose Route Frequency Provider Last Rate Last Admin   acetaminophen (TYLENOL) tablet 650 mg  650 mg Oral Q6H PRN Nwoko, Uchenna E, PA   650 mg at 12/03/22 2002   alum & mag  hydroxide-simeth (MAALOX/MYLANTA) 200-200-20 MG/5ML suspension 30 mL  30 mL Oral Q4H PRN Nwoko, Uchenna E, PA       diphenhydrAMINE (BENADRYL) capsule 50 mg  50 mg Oral Q6H PRN Nwoko, Uchenna E, PA       Or   diphenhydrAMINE (BENADRYL) injection 50 mg  50 mg Intramuscular Q6H PRN Nwoko, Uchenna E, PA       FLUoxetine (PROZAC) capsule 20 mg  20 mg Oral Daily Oneta Rack, NP   20 mg at 12/06/22 1324   haloperidol (HALDOL) tablet 5 mg  5 mg Oral Q6H PRN Nwoko, Uchenna E, PA       Or   haloperidol lactate (HALDOL) injection 5 mg  5 mg Intramuscular Q6H PRN Nwoko, Uchenna E, PA       hydrOXYzine (ATARAX) tablet 25 mg  25 mg Oral TID PRN Nwoko, Uchenna E, PA       LORazepam (ATIVAN) tablet 2 mg  2 mg Oral Q6H PRN Nwoko, Uchenna E, PA       Or   LORazepam (ATIVAN) injection 2 mg  2 mg Intramuscular Q6H PRN Nwoko, Uchenna E, PA       LORazepam (ATIVAN) tablet 1 mg  1 mg Oral Daily Nwoko, Uchenna E, PA       magnesium hydroxide (MILK OF MAGNESIA) suspension 30 mL  30 mL Oral Daily PRN Nwoko, Uchenna E, PA       multivitamin with minerals tablet 1 tablet  1 tablet Oral Daily Nwoko, Uchenna E, PA   1 tablet at 12/06/22 0837   risperiDONE (RISPERDAL) tablet 1 mg  1 mg Oral QHS Oneta Rack, NP   1 mg at 12/06/22 2109  thiamine (VITAMIN B1) tablet 100 mg  100 mg Oral Daily Nwoko, Uchenna E, PA   100 mg at 12/06/22 0837   traZODone (DESYREL) tablet 50 mg  50 mg Oral QHS PRN Nwoko, Uchenna E, PA   50 mg at 12/06/22 2109   Current Outpatient Medications  Medication Sig Dispense Refill   benztropine (COGENTIN) 1 MG tablet Take 1 tablet (1 mg total) by mouth daily for 10 days. 10 tablet 0   carvedilol (COREG) 3.125 MG tablet Take 1 tablet (3.125 mg total) by mouth 2 (two) times daily with a meal. 10 tablet 0   FLUoxetine (PROZAC) 20 MG capsule Take 1 capsule (20 mg total) by mouth daily for 10 days. 10 capsule 0   mometasone-formoterol (DULERA) 200-5 MCG/ACT AERO Inhale 2 puffs into the lungs 2 (two)  times daily for 10 days. 1 each 0   risperiDONE (RISPERDAL) 1 MG tablet Take 1 tablet (1 mg total) by mouth at bedtime for 10 days. 10 tablet 0   topiramate (TOPAMAX) 50 MG tablet Take 1 tablet (50 mg total) by mouth daily for 10 days. 10 tablet 0   traZODone (DESYREL) 100 MG tablet Take 1 tablet (100 mg total) by mouth at bedtime as needed for up to 10 days for sleep. 10 tablet 0    Labs  Lab Results:  Admission on 12/02/2022, Discharged on 12/03/2022  Component Date Value Ref Range Status   Sodium 12/02/2022 139  135 - 145 mmol/L Final   Potassium 12/02/2022 3.9  3.5 - 5.1 mmol/L Final   Chloride 12/02/2022 103  98 - 111 mmol/L Final   CO2 12/02/2022 26  22 - 32 mmol/L Final   Glucose, Bld 12/02/2022 162 (H)  70 - 99 mg/dL Final   Glucose reference range applies only to samples taken after fasting for at least 8 hours.   BUN 12/02/2022 16  6 - 20 mg/dL Final   Creatinine, Ser 12/02/2022 1.11  0.61 - 1.24 mg/dL Final   Calcium 25/95/6387 9.0  8.9 - 10.3 mg/dL Final   Total Protein 56/43/3295 6.8  6.5 - 8.1 g/dL Final   Albumin 18/84/1660 4.0  3.5 - 5.0 g/dL Final   AST 63/01/6008 29  15 - 41 U/L Final   ALT 12/02/2022 19  0 - 44 U/L Final   Alkaline Phosphatase 12/02/2022 85  38 - 126 U/L Final   Total Bilirubin 12/02/2022 0.8  <1.2 mg/dL Final   GFR, Estimated 12/02/2022 >60  >60 mL/min Final   Comment: (NOTE) Calculated using the CKD-EPI Creatinine Equation (2021)    Anion gap 12/02/2022 10  5 - 15 Final   Performed at [facility], [address] [place], [place] [address]   WBC 12/02/2022 5.9  4.0 - 10.5 K/uL Final   RBC 12/02/2022 4.21 (L)  4.22 - 5.81 MIL/uL Final   Hemoglobin 12/02/2022 12.8 (L)  13.0 - 17.0 g/dL Final   HCT 57/32/2025 38.5 (L)  39.0 - 52.0 % Final   MCV 12/02/2022 91.4  80.0 - 100.0 fL Final   MCH 12/02/2022 30.4  26.0 - 34.0 pg Final   MCHC 12/02/2022 33.2  30.0 - 36.0 g/dL Final   RDW 42/70/6237 14.4  11.5 - 15.5 % Final   Platelets  12/02/2022 311  150 - 400 K/uL Final   nRBC 12/02/2022 0.0  0.0 - 0.2 % Final   Performed at [facility], [address], [place], [place] [address]   Tricyclic, Ur Screen 12/02/2022  NONE DETECTED  NONE DETECTED Final   Amphetamines, Ur Screen 12/02/2022 NONE DETECTED  NONE DETECTED Final   MDMA (Ecstasy)Ur Screen 12/02/2022 NONE DETECTED  NONE DETECTED Final   Cocaine Metabolite,Ur [place] 12/02/2022 POSITIVE (A)  NONE DETECTED Final   Opiate, Ur Screen 12/02/2022 NONE DETECTED  NONE DETECTED Final   Phencyclidine (PCP) Ur S 12/02/2022 NONE DETECTED  NONE DETECTED Final   Cannabinoid 50 Ng, Ur [place] 12/02/2022 NONE DETECTED  NONE DETECTED Final   Barbiturates, Ur Screen 12/02/2022 NONE DETECTED  NONE DETECTED Final   Benzodiazepine, Ur Scrn 12/02/2022 POSITIVE (A)  NONE DETECTED Final   Methadone Scn, Ur 12/02/2022 NONE DETECTED  NONE DETECTED Final   Comment: (NOTE) Tricyclics + metabolites, urine    Cutoff 1000 ng/mL Amphetamines + metabolites, urine  Cutoff 1000 ng/mL MDMA (Ecstasy), urine              Cutoff 500 ng/mL Cocaine Metabolite, urine          Cutoff 300 ng/mL Opiate + metabolites, urine        Cutoff 300 ng/mL Phencyclidine (PCP), urine         Cutoff 25 ng/mL Cannabinoid, urine                 Cutoff 50 ng/mL Barbiturates + metabolites, urine  Cutoff 200 ng/mL Benzodiazepine, urine              Cutoff 200 ng/mL Methadone, urine                   Cutoff 300 ng/mL  The urine drug screen provides only a preliminary, unconfirmed analytical test result and should not be used for non-medical purposes. Clinical consideration and professional judgment should be applied to any positive drug screen result due to possible interfering substances. A more specific alternate chemical method must be used in order to obtain a confirmed analytical result. Gas chromatography / mass spectrometry (GC/MS) is the preferred confirm                          atory method. Performed at  [facility], [address] [place], [place] [address]    Acetaminophen (Tylenol), Serum 12/02/2022 <10 (L)  10 - 30 ug/mL Final   Comment: (NOTE) Therapeutic concentrations vary significantly. A range of 10-30 ug/mL  may be an effective concentration for many patients. However, some  are best treated at concentrations outside of this range. Acetaminophen concentrations >150 ug/mL at 4 hours after ingestion  and >50 ug/mL at 12 hours after ingestion are often associated with  toxic reactions.  Performed at [facility], [address] [place], [place] [address]    Alcohol, Ethyl (B) 12/02/2022 <10  <10 mg/dL Final   Comment: (NOTE) Lowest detectable limit for serum alcohol is 10 mg/dL.  For medical purposes only. Performed at [facility], [address] [place], [place] [address]    Salicylate Lvl 12/02/2022 <7.0 (L)  7.0 - 30.0 mg/dL Final   Performed at [facility], [address] [place], [place] [address]  Admission on 11/17/2022, Discharged on 11/17/2022  Component Date Value Ref Range Status   Sodium 11/17/2022 139  135 - 145 mmol/L Final   Potassium 11/17/2022 4.1  3.5 - 5.1 mmol/L Final   Chloride 11/17/2022 100  98 - 111 mmol/L Final   CO2 11/17/2022 29  22 - 32 mmol/L Final   Glucose, Bld 11/17/2022 90  70 - 99 mg/dL Final   Glucose reference range applies only to samples taken after fasting for at least 8 hours.   BUN 11/17/2022 10  6 - 20 mg/dL Final   Creatinine, Ser 11/17/2022 0.68  0.61 - 1.24 mg/dL Final   Calcium 09/81/1914 9.0  8.9 - 10.3 mg/dL Final   Total Protein 78/29/5621 8.1  6.5 - 8.1 g/dL Final   Albumin 30/86/5784 4.6  3.5 - 5.0 g/dL Final   AST 69/62/9528 22  15 - 41 U/L Final   ALT 11/17/2022 20  0 - 44 U/L Final   Alkaline Phosphatase 11/17/2022 61  38 - 126 U/L Final   Total Bilirubin 11/17/2022 1.1  <1.2 mg/dL Final   GFR, Estimated 11/17/2022 >60  >60 mL/min Final   Comment: (NOTE) Calculated  using the CKD-EPI Creatinine Equation (2021)    Anion gap 11/17/2022 10  5 - 15 Final   Performed at [facility], [address] [place], [place] [address]   Alcohol, Ethyl (B) 11/17/2022 <10  <10 mg/dL Final   Comment: (NOTE) Lowest detectable limit for serum alcohol is 10 mg/dL.  For medical purposes only. Performed at [facility], [address] [place], [place] [address]    Salicylate Lvl 11/17/2022 <7.0 (L)  7.0 - 30.0 mg/dL Final   Performed at [facility], [address] [place], [place] [address]   Acetaminophen (Tylenol), Serum 11/17/2022 <10 (L)  10 - 30 ug/mL Final   Comment: (NOTE) Therapeutic concentrations vary significantly. A range of 10-30 ug/mL  may be an effective concentration for many patients. However, some  are best treated at concentrations outside of this range. Acetaminophen concentrations >150 ug/mL at 4 hours after ingestion  and >50 ug/mL at 12 hours after ingestion are often associated with  toxic reactions.  Performed at [facility], [address] [place], [place] [address]    WBC 11/17/2022 7.6  4.0 - 10.5 K/uL Final   RBC 11/17/2022 5.07  4.22 - 5.81 MIL/uL Final   Hemoglobin 11/17/2022 15.2  13.0 - 17.0 g/dL Final   HCT 47/42/5956 46.8  39.0 - 52.0 % Final   MCV 11/17/2022 92.3  80.0 - 100.0 fL Final   MCH 11/17/2022 30.0  26.0 - 34.0 pg Final   MCHC 11/17/2022 32.5  30.0 - 36.0 g/dL Final   RDW 38/75/6433 14.6  11.5 - 15.5 % Final   Platelets 11/17/2022 345  150 - 400 K/uL Final   nRBC 11/17/2022 0.0  0.0 - 0.2 % Final   Performed at [facility], [address] [place], [place] [address]   Tricyclic, Ur Screen 11/17/2022 NONE DETECTED  NONE DETECTED Final   Amphetamines, Ur Screen 11/17/2022 NONE DETECTED  NONE DETECTED Final   MDMA (Ecstasy)Ur Screen 11/17/2022 NONE DETECTED  NONE DETECTED Final   Cocaine Metabolite,Ur [place] 11/17/2022 POSITIVE (A)  NONE DETECTED Final   Opiate, Ur  Screen 11/17/2022 NONE DETECTED  NONE DETECTED Final   Phencyclidine (PCP) Ur S 11/17/2022 NONE DETECTED  NONE DETECTED Final   Cannabinoid 50 Ng, Ur [place] 11/17/2022 POSITIVE (A)  NONE DETECTED Final   Barbiturates, Ur Screen 11/17/2022 NONE DETECTED  NONE DETECTED Final   Benzodiazepine, Ur Scrn 11/17/2022 POSITIVE (A)  NONE DETECTED Final   Methadone Scn, Ur 11/17/2022 NONE DETECTED  NONE DETECTED Final   Comment: (NOTE) Tricyclics + metabolites, urine    Cutoff 1000 ng/mL Amphetamines + metabolites, urine  Cutoff 1000 ng/mL MDMA (Ecstasy), urine  Cutoff 500 ng/mL Cocaine Metabolite, urine          Cutoff 300 ng/mL Opiate + metabolites, urine        Cutoff 300 ng/mL Phencyclidine (PCP), urine         Cutoff 25 ng/mL Cannabinoid, urine                 Cutoff 50 ng/mL Barbiturates + metabolites, urine  Cutoff 200 ng/mL Benzodiazepine, urine              Cutoff 200 ng/mL Methadone, urine                   Cutoff 300 ng/mL  The urine drug screen provides only a preliminary, unconfirmed analytical test result and should not be used for non-medical purposes. Clinical consideration and professional judgment should be applied to any positive drug screen result due to possible interfering substances. A more specific alternate chemical method must be used in order to obtain a confirmed analytical result. Gas chromatography / mass spectrometry (GC/MS) is the preferred confirm                          atory method. Performed at [facility], [address] [place], [place] [address]   Admission on 11/13/2022, Discharged on 11/13/2022  Component Date Value Ref Range Status   Sodium 11/13/2022 141  135 - 145 mmol/L Final   Potassium 11/13/2022 4.0  3.5 - 5.1 mmol/L Final   Chloride 11/13/2022 108  98 - 111 mmol/L Final   CO2 11/13/2022 26  22 - 32 mmol/L Final   Glucose, Bld 11/13/2022 126 (H)  70 - 99 mg/dL Final   Glucose reference range applies only to samples taken  after fasting for at least 8 hours.   BUN 11/13/2022 14  6 - 20 mg/dL Final   Creatinine, Ser 11/13/2022 0.91  0.61 - 1.24 mg/dL Final   Calcium 82/95/6213 9.1  8.9 - 10.3 mg/dL Final   Total Protein 08/65/7846 7.2  6.5 - 8.1 g/dL Final   Albumin 96/29/5284 4.1  3.5 - 5.0 g/dL Final   AST 13/24/4010 30  15 - 41 U/L Final   ALT 11/13/2022 23  0 - 44 U/L Final   Alkaline Phosphatase 11/13/2022 76  38 - 126 U/L Final   Total Bilirubin 11/13/2022 1.9 (H)  <1.2 mg/dL Final   GFR, Estimated 11/13/2022 >60  >60 mL/min Final   Comment: (NOTE) Calculated using the CKD-EPI Creatinine Equation (2021)    Anion gap 11/13/2022 7  5 - 15 Final   Performed at [facility], [address] [place], [place] [address]   Alcohol, Ethyl (B) 11/13/2022 <10  <10 mg/dL Final   Comment: (NOTE) Lowest detectable limit for serum alcohol is 10 mg/dL.  For medical purposes only. Performed at [facility], [address], [place], [place] [address]    Salicylate Lvl 11/13/2022 <7.0 (L)  7.0 - 30.0 mg/dL Final   Performed at [facility], [address], [place], [place] [address]   Acetaminophen (Tylenol), Serum 11/13/2022 <10 (L)  10 - 30 ug/mL Final   Comment: (NOTE) Therapeutic concentrations vary significantly. A range of 10-30 ug/mL  may be an effective concentration for many patients. However, some  are best treated at concentrations outside of this range. Acetaminophen concentrations >150 ug/mL at 4 hours after ingestion  and >50 ug/mL at 12 hours after ingestion are  often associated with  toxic reactions.  Performed at [facility], [address], [place], [place] [address]    WBC 11/13/2022 7.6  4.0 - 10.5 K/uL Final   RBC 11/13/2022 4.70  4.22 - 5.81 MIL/uL Final   Hemoglobin 11/13/2022 14.2  13.0 - 17.0 g/dL Final   HCT 19/14/7829 43.6  39.0 - 52.0 % Final   MCV 11/13/2022 92.8  80.0 - 100.0 fL Final   MCH 11/13/2022  30.2  26.0 - 34.0 pg Final   MCHC 11/13/2022 32.6  30.0 - 36.0 g/dL Final   RDW 56/21/3086 15.0  11.5 - 15.5 % Final   Platelets 11/13/2022 308  150 - 400 K/uL Final   nRBC 11/13/2022 0.0  0.0 - 0.2 % Final   Performed at [facility], [address] [place], [place] [address]   Opiates 11/13/2022 NONE DETECTED  NONE DETECTED Final   Cocaine 11/13/2022 POSITIVE (A)  NONE DETECTED Final   Benzodiazepines 11/13/2022 POSITIVE (A)  NONE DETECTED Final   Amphetamines 11/13/2022 NONE DETECTED  NONE DETECTED Final   Tetrahydrocannabinol 11/13/2022 POSITIVE (A)  NONE DETECTED Final   Barbiturates 11/13/2022 NONE DETECTED  NONE DETECTED Final   Comment: (NOTE) DRUG SCREEN FOR MEDICAL PURPOSES ONLY.  IF CONFIRMATION IS NEEDED FOR ANY PURPOSE, NOTIFY LAB WITHIN 5 DAYS.  LOWEST DETECTABLE LIMITS FOR URINE DRUG SCREEN Drug Class                     Cutoff (ng/mL) Amphetamine and metabolites    1000 Barbiturate and metabolites    200 Benzodiazepine                 200 Opiates and metabolites        300 Cocaine and metabolites        300 THC                            50 Performed at [facility], [address], [place], [place] [address]   Admission on 11/07/2022, Discharged on 11/07/2022  Component Date Value Ref Range Status   Acetaminophen (Tylenol), Serum 11/07/2022 <10 (L)  10 - 30 ug/mL Final   Comment: (NOTE) Therapeutic concentrations vary significantly. A range of 10-30 ug/mL  may be an effective concentration for many patients. However, some  are best treated at concentrations outside of this range. Acetaminophen concentrations >150 ug/mL at 4 hours after ingestion  and >50 ug/mL at 12 hours after ingestion are often associated with  toxic reactions.  Performed at [facility], [address] [place], [place] [address]    Sodium 11/07/2022 137  135 - 145 mmol/L Final   Potassium 11/07/2022 3.7  3.5 - 5.1 mmol/L Final    Chloride 11/07/2022 107  98 - 111 mmol/L Final   CO2 11/07/2022 22  22 - 32 mmol/L Final   Glucose, Bld 11/07/2022 147 (H)  70 - 99 mg/dL Final   Glucose reference range applies only to samples taken after fasting for at least 8 hours.   BUN 11/07/2022 18  6 - 20 mg/dL Final   Creatinine, Ser 11/07/2022 0.93  0.61 - 1.24 mg/dL Final   Calcium 24/40/1027 8.5 (L)  8.9 - 10.3 mg/dL Final   Total Protein 25/36/6440 6.5  6.5 - 8.1 g/dL Final   Albumin 34/74/2595 3.5  3.5 - 5.0 g/dL Final   AST 63/87/5643 24  15 -  41 U/L Final   ALT 11/07/2022 21  0 - 44 U/L Final   Alkaline Phosphatase 11/07/2022 65  38 - 126 U/L Final   Total Bilirubin 11/07/2022 1.0  0.3 - 1.2 mg/dL Final   GFR, Estimated 11/07/2022 >60  >60 mL/min Final   Comment: (NOTE) Calculated using the CKD-EPI Creatinine Equation (2021)    Anion gap 11/07/2022 8  5 - 15 Final   Performed at [facility], [address] [place], [place] [address]   Alcohol, Ethyl (B) 11/07/2022 <10  <10 mg/dL Final   Comment: (NOTE) Lowest detectable limit for serum alcohol is 10 mg/dL.  For medical purposes only. Performed at [facility], [address] [place], [place] [address]    Salicylate Lvl 11/07/2022 <7.0 (L)  7.0 - 30.0 mg/dL Final   Performed at [facility], [address] [place], [place] [address]   WBC 11/07/2022 10.9 (H)  4.0 - 10.5 K/uL Final   RBC 11/07/2022 3.91 (L)  4.22 - 5.81 MIL/uL Final   Hemoglobin 11/07/2022 11.8 (L)  13.0 - 17.0 g/dL Final   HCT 46/96/2952 35.3 (L)  39.0 - 52.0 % Final   MCV 11/07/2022 90.3  80.0 - 100.0 fL Final   MCH 11/07/2022 30.2  26.0 - 34.0 pg Final   MCHC 11/07/2022 33.4  30.0 - 36.0 g/dL Final   RDW 84/13/2440 14.9  11.5 - 15.5 % Final   Platelets 11/07/2022 254  150 - 400 K/uL Final   nRBC 11/07/2022 0.0  0.0 - 0.2 % Final   Neutrophils Relative % 11/07/2022 84  % Final   Neutro Abs 11/07/2022 9.1 (H)  1.7 - 7.7 K/uL Final   Lymphocytes Relative  11/07/2022 7  % Final   Lymphs Abs 11/07/2022 0.8  0.7 - 4.0 K/uL Final   Monocytes Relative 11/07/2022 6  % Final   Monocytes Absolute 11/07/2022 0.6  0.1 - 1.0 K/uL Final   Eosinophils Relative 11/07/2022 2  % Final   Eosinophils Absolute 11/07/2022 0.2  0.0 - 0.5 K/uL Final   Basophils Relative 11/07/2022 1  % Final   Basophils Absolute 11/07/2022 0.1  0.0 - 0.1 K/uL Final   Immature Granulocytes 11/07/2022 0  % Final   Abs Immature Granulocytes 11/07/2022 0.04  0.00 - 0.07 K/uL Final   Performed at [facility], [address] [place], [place] [address]  Admission on 10/27/2022, Discharged on 11/01/2022  Component Date Value Ref Range Status   Cholesterol 10/31/2022 203 (H)  0 - 200 mg/dL Final   Triglycerides 53/66/4403 196 (H)  <150 mg/dL Final   HDL 47/42/5956 84  >40 mg/dL Final   Total CHOL/HDL Ratio 10/31/2022 2.4  RATIO Final   VLDL 10/31/2022 39  0 - 40 mg/dL Final   LDL Cholesterol 10/31/2022 80  0 - 99 mg/dL Final   Comment:        Total Cholesterol/HDL:CHD Risk Coronary Heart Disease Risk Table                     Men   Women  1/2 Average Risk   3.4   3.3  Average Risk       5.0   4.4  2 X Average Risk   9.6   7.1  3 X Average Risk  23.4   11.0        Use the calculated Patient Ratio above and the CHD Risk Table to determine the patient's CHD Risk.  ATP III CLASSIFICATION (LDL):  <100     mg/dL   Optimal  564-332  mg/dL   Near or Above                    Optimal  130-159  mg/dL   Borderline  951-884  mg/dL   High  >166     mg/dL   Very High Performed at [facility], [address] [place], [place] [address]   Admission on 10/26/2022, Discharged on 10/27/2022  Component Date Value Ref Range Status   WBC 10/26/2022 9.6  4.0 - 10.5 K/uL Final   RBC 10/26/2022 4.98  4.22 - 5.81 MIL/uL Final   Hemoglobin 10/26/2022 14.7  13.0 - 17.0 g/dL Final   HCT 60/10/9321 44.1  39.0 - 52.0 % Final   MCV 10/26/2022 88.6  80.0 - 100.0 fL Final    MCH 10/26/2022 29.5  26.0 - 34.0 pg Final   MCHC 10/26/2022 33.3  30.0 - 36.0 g/dL Final   RDW 55/73/2202 14.3  11.5 - 15.5 % Final   Platelets 10/26/2022 378  150 - 400 K/uL Final   nRBC 10/26/2022 0.0  0.0 - 0.2 % Final   Performed at [facility] [address] [place], [place] [address]   Sodium 10/26/2022 135  135 - 145 mmol/L Final   Potassium 10/26/2022 4.1  3.5 - 5.1 mmol/L Final   Chloride 10/26/2022 101  98 - 111 mmol/L Final   CO2 10/26/2022 23  22 - 32 mmol/L Final   Glucose, Bld 10/26/2022 93  70 - 99 mg/dL Final   Glucose reference range applies only to samples taken after fasting for at least 8 hours.   BUN 10/26/2022 15  6 - 20 mg/dL Final   Creatinine, Ser 10/26/2022 0.89  0.61 - 1.24 mg/dL Final   Calcium 62/37/6283 9.2  8.9 - 10.3 mg/dL Final   GFR, Estimated 10/26/2022 >60  >60 mL/min Final   Comment: (NOTE) Calculated using the CKD-EPI Creatinine Equation (2021)    Anion gap 10/26/2022 11  5 - 15 Final   Performed at [facility], [address] [place], [place] [address]   Troponin I (High Sensitivity) 10/26/2022 4  <18 ng/L Final   Comment: (NOTE) Elevated high sensitivity troponin I (hsTnI) values and significant  changes across serial measurements may suggest ACS but many other  chronic and acute conditions are known to elevate hsTnI results.  Refer to the "Links" section for chest pain algorithms and additional  guidance. Performed at [facility], [address] [place], [place] [address]    Acetaminophen (Tylenol), Serum 10/26/2022 <10 (L)  10 - 30 ug/mL Final   Comment: (NOTE) Therapeutic concentrations vary significantly. A range of 10-30 ug/mL  may be an effective concentration for many patients. However, some  are best treated at concentrations outside of this range. Acetaminophen concentrations >150 ug/mL at 4 hours after ingestion  and >50 ug/mL at 12 hours after ingestion are often associated with  toxic  reactions.  Performed at [facility], [address] [place], [place] [address]    Salicylate Lvl 10/26/2022 <7.0 (L)  7.0 - 30.0 mg/dL Final   Performed at [facility], [address] [place], [place] [address]   Alcohol, Ethyl (B) 10/26/2022 <10  <10 mg/dL Final   Comment: (NOTE) Lowest detectable limit for serum alcohol is 10 mg/dL.  For medical purposes only. Performed at [facility], [address] [place], [place] [address]    SARS Coronavirus 2 by RT PCR 10/26/2022 NEGATIVE  NEGATIVE Final   Comment: (NOTE) SARS-CoV-2 target nucleic acids are NOT DETECTED.  The SARS-CoV-2 RNA is generally detectable in upper respiratory specimens during the acute phase of infection. The lowest concentration of SARS-CoV-2 viral copies this assay can detect is 138 copies/mL. A negative result does not preclude SARS-Cov-2 infection and should not be used as the sole basis for treatment or other patient management decisions. A negative result may occur with  improper specimen collection/handling, submission of specimen other than nasopharyngeal swab, presence of viral mutation(s) within the areas targeted by this assay, and inadequate number of viral copies(<138 copies/mL). A negative result must be combined with clinical observations, patient history, and epidemiological information. The expected result is Negative.  Fact Sheet for Patients:  BloggerCourse.com  Fact Sheet for Healthcare Providers:  SeriousBroker.it  This test is no                          t yet approved or cleared by the [place] FDA and  has been authorized for detection and/or diagnosis of SARS-CoV-2 by FDA under an Emergency Use Authorization (EUA). This EUA will remain  in effect (meaning this test can be used) for the duration of the COVID-19 declaration under Section 564(b)(1) of the Act, 21 U.S.C.section 360bbb-3(b)(1),  unless the authorization is terminated  or revoked sooner.       Influenza A by PCR 10/26/2022 NEGATIVE  NEGATIVE Final   Influenza B by PCR 10/26/2022 NEGATIVE  NEGATIVE Final   Comment: (NOTE) The Xpert Xpress SARS-CoV-2/FLU/RSV plus assay is intended as an aid in the diagnosis of influenza from Nasopharyngeal swab specimens and should not be used as a sole basis for treatment. Nasal washings and aspirates are unacceptable for Xpert Xpress SARS-CoV-2/FLU/RSV testing.  Fact Sheet for Patients: BloggerCourse.com  Fact Sheet for Healthcare Providers: SeriousBroker.it  This test is not yet approved or cleared by the [place] FDA and has been authorized for detection and/or diagnosis of SARS-CoV-2 by FDA under an Emergency Use Authorization (EUA). This EUA will remain in effect (meaning this test can be used) for the duration of the COVID-19 declaration under Section 564(b)(1) of the Act, 21 U.S.C. section 360bbb-3(b)(1), unless the authorization is terminated or revoked.     Resp Syncytial Virus by PCR 10/26/2022 NEGATIVE  NEGATIVE Final   Comment: (NOTE) Fact Sheet for Patients: BloggerCourse.com  Fact Sheet for Healthcare Providers: SeriousBroker.it  This test is not yet approved or cleared by the [place] FDA and has been authorized for detection and/or diagnosis of SARS-CoV-2 by FDA under an Emergency Use Authorization (EUA). This EUA will remain in effect (meaning this test can be used) for the duration of the COVID-19 declaration under Section 564(b)(1) of the Act, 21 U.S.C. section 360bbb-3(b)(1), unless the authorization is terminated or revoked.  Performed at [facility], [address] [place], [place] [address]    Troponin I (High Sensitivity) 10/26/2022 5  <18 ng/L Final   Comment: (NOTE) Elevated high sensitivity troponin I (hsTnI) values  and significant  changes across serial measurements may suggest ACS but many other  chronic and acute conditions are known to elevate hsTnI results.  Refer to the "Links" section for chest pain algorithms and additional  guidance. Performed at [facility], [address], [place], [place] [address]    Tricyclic, Ur Screen 10/26/2022 NONE DETECTED  NONE DETECTED Final   Amphetamines, Ur Screen 10/26/2022 NONE DETECTED  NONE DETECTED Final   MDMA (Ecstasy)Ur Screen 10/26/2022 NONE DETECTED  NONE DETECTED Final   Cocaine Metabolite,Ur [place] 10/26/2022 POSITIVE (A)  NONE DETECTED Final   Opiate, Ur Screen 10/26/2022 NONE DETECTED  NONE DETECTED Final   Phencyclidine (PCP) Ur S 10/26/2022 NONE DETECTED  NONE DETECTED Final   Cannabinoid 50 Ng, Ur [place] 10/26/2022 NONE DETECTED  NONE DETECTED Final   Barbiturates, Ur Screen 10/26/2022 NONE DETECTED  NONE DETECTED Final   Benzodiazepine, Ur Scrn 10/26/2022 POSITIVE (A)  NONE DETECTED Final   Methadone Scn, Ur 10/26/2022 NONE DETECTED  NONE DETECTED Final   Comment: (NOTE) Tricyclics + metabolites, urine    Cutoff 1000 ng/mL Amphetamines + metabolites, urine  Cutoff 1000 ng/mL MDMA (Ecstasy), urine              Cutoff 500 ng/mL Cocaine Metabolite, urine          Cutoff 300 ng/mL Opiate + metabolites, urine        Cutoff 300 ng/mL Phencyclidine (PCP), urine         Cutoff 25 ng/mL Cannabinoid, urine                 Cutoff 50 ng/mL Barbiturates + metabolites, urine  Cutoff 200 ng/mL Benzodiazepine, urine              Cutoff 200 ng/mL Methadone, urine                   Cutoff 300 ng/mL  The urine drug screen provides only a preliminary, unconfirmed analytical test result and should not be used for non-medical purposes. Clinical consideration and professional judgment should be applied to any positive drug screen result due to possible interfering substances. A more specific alternate chemical method must be used in order to obtain a  confirmed analytical result. Gas chromatography / mass spectrometry (GC/MS) is the preferred confirm                          atory method. Performed at [facility], [address] [place], [place] [address]   Admission on 10/22/2022, Discharged on 10/24/2022  Component Date Value Ref Range Status   Sodium 10/22/2022 139  135 - 145 mmol/L Final   Potassium 10/22/2022 3.5  3.5 - 5.1 mmol/L Final   Chloride 10/22/2022 104  98 - 111 mmol/L Final   CO2 10/22/2022 27  22 - 32 mmol/L Final   Glucose, Bld 10/22/2022 128 (H)  70 - 99 mg/dL Final   Glucose reference range applies only to samples taken after fasting for at least 8 hours.   BUN 10/22/2022 14  6 - 20 mg/dL Final   Creatinine, Ser 10/22/2022 0.76  0.61 - 1.24 mg/dL Final   Calcium 91/47/8295 8.9  8.9 - 10.3 mg/dL Final   Total Protein 62/13/0865 7.8  6.5 - 8.1 g/dL Final   Albumin 78/46/9629 4.4  3.5 - 5.0 g/dL Final   AST 52/84/1324 22  15 - 41 U/L Final   ALT 10/22/2022 19  0 - 44 U/L Final   Alkaline Phosphatase 10/22/2022 69  38 - 126 U/L Final   Total Bilirubin 10/22/2022 1.1  0.3 - 1.2 mg/dL Final   GFR, Estimated 10/22/2022 >60  >60 mL/min Final   Comment: (NOTE) Calculated using the CKD-EPI Creatinine Equation (2021)    Anion gap 10/22/2022 8  5 - 15 Final  Performed at [facility], [address] [place], [place] [address]   WBC 10/22/2022 11.9 (H)  4.0 - 10.5 K/uL Final   RBC 10/22/2022 4.85  4.22 - 5.81 MIL/uL Final   Hemoglobin 10/22/2022 14.4  13.0 - 17.0 g/dL Final   HCT 60/45/4098 44.3  39.0 - 52.0 % Final   MCV 10/22/2022 91.3  80.0 - 100.0 fL Final   MCH 10/22/2022 29.7  26.0 - 34.0 pg Final   MCHC 10/22/2022 32.5  30.0 - 36.0 g/dL Final   RDW 11/91/4782 13.9  11.5 - 15.5 % Final   Platelets 10/22/2022 279  150 - 400 K/uL Final   nRBC 10/22/2022 0.0  0.0 - 0.2 % Final   Neutrophils Relative % 10/22/2022 72  % Final   Neutro Abs 10/22/2022 8.5 (H)  1.7 - 7.7 K/uL Final    Lymphocytes Relative 10/22/2022 14  % Final   Lymphs Abs 10/22/2022 1.7  0.7 - 4.0 K/uL Final   Monocytes Relative 10/22/2022 8  % Final   Monocytes Absolute 10/22/2022 1.0  0.1 - 1.0 K/uL Final   Eosinophils Relative 10/22/2022 5  % Final   Eosinophils Absolute 10/22/2022 0.7 (H)  0.0 - 0.5 K/uL Final   Basophils Relative 10/22/2022 1  % Final   Basophils Absolute 10/22/2022 0.1  0.0 - 0.1 K/uL Final   Immature Granulocytes 10/22/2022 0  % Final   Abs Immature Granulocytes 10/22/2022 0.04  0.00 - 0.07 K/uL Final   Performed at [facility], [address] [place], [place] [address]   Troponin I (High Sensitivity) 10/22/2022 5  <18 ng/L Final   Comment: (NOTE) Elevated high sensitivity troponin I (hsTnI) values and significant  changes across serial measurements may suggest ACS but many other  chronic and acute conditions are known to elevate hsTnI results.  Refer to the "Links" section for chest pain algorithms and additional  guidance. Performed at [facility], [address] [place], [place] [address]    Troponin I (High Sensitivity) 10/22/2022 5  <18 ng/L Final   Comment: (NOTE) Elevated high sensitivity troponin I (hsTnI) values and significant  changes across serial measurements may suggest ACS but many other  chronic and acute conditions are known to elevate hsTnI results.  Refer to the "Links" section for chest pain algorithms and additional  guidance. Performed at [facility], [address] [place], [place] [address]    pH, Ven 10/22/2022 7.34  7.25 - 7.43 Final   pCO2, Ven 10/22/2022 57  44 - 60 mmHg Final   pO2, Ven 10/22/2022 37  32 - 45 mmHg Final   Bicarbonate 10/22/2022 30.8 (H)  20.0 - 28.0 mmol/L Final   Acid-Base Excess 10/22/2022 3.5 (H)  0.0 - 2.0 mmol/L Final   O2 Saturation 10/22/2022 54.4  % Final   Patient temperature 10/22/2022 37.0   Final   Collection site 10/22/2022 VEIN   Final   Performed at [facility], [address] [place], [place] [address]   Glucose-Capillary 10/22/2022 198 (H)  70 - 99 mg/dL Final   Glucose reference range applies only to samples taken after fasting for at least 8 hours.   O2 Content 10/22/2022 4.0  L/min Final   Delivery systems 10/22/2022 NASAL CANNULA   Final   pH, Arterial 10/22/2022 7.41  7.35 - 7.45 Final   pCO2 arterial 10/22/2022 40  32 - 48 mmHg Final   pO2, Arterial 10/22/2022 124 (H)  83 - 108  mmHg Final   Bicarbonate 10/22/2022 25.4  20.0 - 28.0 mmol/L Final   Acid-Base Excess 10/22/2022 0.7  0.0 - 2.0 mmol/L Final   O2 Saturation 10/22/2022 99.3  % Final   Patient temperature 10/22/2022 37.0   Final   Collection site 10/22/2022 RIGHT RADIAL   Final   Allens test (pass/fail) 10/22/2022 PASS  PASS Final   Performed at [facility], [address] [place], [place] [address]  Admission on 09/24/2022, Discharged on 09/24/2022  Component Date Value Ref Range Status   Troponin I (High Sensitivity) 09/24/2022 5  <18 ng/L Final   Comment: (NOTE) Elevated high sensitivity troponin I (hsTnI) values and significant  changes across serial measurements may suggest ACS but many other  chronic and acute conditions are known to elevate hsTnI results.  Refer to the "Links" section for chest pain algorithms and additional  guidance. Performed at [facility], [address], [place], [place] [address]    SARS Coronavirus 2 by RT PCR 09/24/2022 NEGATIVE  NEGATIVE Final   Influenza A by PCR 09/24/2022 NEGATIVE  NEGATIVE Final   Influenza B by PCR 09/24/2022 NEGATIVE  NEGATIVE Final   Comment: (NOTE) The Xpert Xpress SARS-CoV-2/FLU/RSV plus assay is intended as an aid in the diagnosis of influenza from Nasopharyngeal swab specimens and should not be used as a sole basis for treatment. Nasal washings and aspirates are unacceptable for Xpert Xpress SARS-CoV-2/FLU/RSV testing.  Fact Sheet for  Patients: BloggerCourse.com  Fact Sheet for Healthcare Providers: SeriousBroker.it  This test is not yet approved or cleared by the [place] FDA and has been authorized for detection and/or diagnosis of SARS-CoV-2 by FDA under an Emergency Use Authorization (EUA). This EUA will remain in effect (meaning this test can be used) for the duration of the COVID-19 declaration under Section 564(b)(1) of the Act, 21 U.S.C. section 360bbb-3(b)(1), unless the authorization is terminated or revoked.     Resp Syncytial Virus by PCR 09/24/2022 NEGATIVE  NEGATIVE Final   Comment: (NOTE) Fact Sheet for Patients: BloggerCourse.com  Fact Sheet for Healthcare Providers: SeriousBroker.it  This test is not yet approved or cleared by the [place] FDA and has been authorized for detection and/or diagnosis of SARS-CoV-2 by FDA under an Emergency Use Authorization (EUA). This EUA will remain in effect (meaning this test can be used) for the duration of the COVID-19 declaration under Section 564(b)(1) of the Act, 21 U.S.C. section 360bbb-3(b)(1), unless the authorization is terminated or revoked.  Performed at [facility], [address], [place], [place] [address]    Sodium 09/24/2022 139  135 - 145 mmol/L Final   Potassium 09/24/2022 4.0  3.5 - 5.1 mmol/L Final   HEMOLYSIS AT THIS LEVEL MAY AFFECT RESULT   Chloride 09/24/2022 101  98 - 111 mmol/L Final   CO2 09/24/2022 27  22 - 32 mmol/L Final   Glucose, Bld 09/24/2022 121 (H)  70 - 99 mg/dL Final   Glucose reference range applies only to samples taken after fasting for at least 8 hours.   BUN 09/24/2022 10  6 - 20 mg/dL Final   Creatinine, Ser 09/24/2022 0.98  0.61 - 1.24 mg/dL Final   Calcium 91/47/8295 9.1  8.9 - 10.3 mg/dL Final   GFR, Estimated 09/24/2022 >60  >60 mL/min Final   Comment: (NOTE) Calculated using the CKD-EPI  Creatinine Equation (2021)    Anion gap 09/24/2022 11  5 - 15 Final   Performed at [facility], [address], [place], [place] [address]   WBC 09/24/2022 8.0  4.0 - 10.5 K/uL Final   RBC 09/24/2022 4.78  4.22 - 5.81 MIL/uL Final   Hemoglobin 09/24/2022 14.3  13.0 - 17.0 g/dL Final   HCT 52/84/1324 42.7  39.0 - 52.0 % Final   MCV 09/24/2022 89.3  80.0 - 100.0 fL Final   MCH 09/24/2022 29.9  26.0 - 34.0 pg Final   MCHC 09/24/2022 33.5  30.0 - 36.0 g/dL Final   RDW 40/10/2723 14.0  11.5 - 15.5 % Final   Platelets 09/24/2022 333  150 - 400 K/uL Final   nRBC 09/24/2022 0.0  0.0 - 0.2 % Final   Neutrophils Relative % 09/24/2022 61  % Final   Neutro Abs 09/24/2022 4.8  1.7 - 7.7 K/uL Final   Lymphocytes Relative 09/24/2022 27  % Final   Lymphs Abs 09/24/2022 2.2  0.7 - 4.0 K/uL Final   Monocytes Relative 09/24/2022 8  % Final   Monocytes Absolute 09/24/2022 0.7  0.1 - 1.0 K/uL Final   Eosinophils Relative 09/24/2022 3  % Final   Eosinophils Absolute 09/24/2022 0.3  0.0 - 0.5 K/uL Final   Basophils Relative 09/24/2022 1  % Final   Basophils Absolute 09/24/2022 0.1  0.0 - 0.1 K/uL Final   Immature Granulocytes 09/24/2022 0  % Final   Abs Immature Granulocytes 09/24/2022 0.02  0.00 - 0.07 K/uL Final   Performed at [facility], [address] [place], [place] [address]  Office Visit on 07/12/2022  Component Date Value Ref Range Status   Hemoglobin A1C 07/12/2022 5.9 (A)  4.0 - 5.6 % Final  Admission on 06/30/2022, Discharged on 07/01/2022  Component Date Value Ref Range Status   WBC 06/30/2022 8.9  4.0 - 10.5 K/uL Final   RBC 06/30/2022 4.26  4.22 - 5.81 MIL/uL Final   Hemoglobin 06/30/2022 12.7 (L)  13.0 - 17.0 g/dL Final   HCT 03/47/4259 39.6  39.0 - 52.0 % Final   MCV 06/30/2022 93.0  80.0 - 100.0 fL Final   MCH 06/30/2022 29.8  26.0 - 34.0 pg Final   MCHC 06/30/2022 32.1  30.0 - 36.0 g/dL Final   RDW 56/38/7564 14.8  11.5 - 15.5 % Final   Platelets 06/30/2022 234   150 - 400 K/uL Final   nRBC 06/30/2022 0.0  0.0 - 0.2 % Final   Performed at [facility], [address] [place], [place] [address]   Sodium 06/30/2022 140  135 - 145 mmol/L Final   Potassium 06/30/2022 3.8  3.5 - 5.1 mmol/L Final   Chloride 06/30/2022 107  98 - 111 mmol/L Final   CO2 06/30/2022 27  22 - 32 mmol/L Final   Glucose, Bld 06/30/2022 108 (H)  70 - 99 mg/dL Final   Glucose reference range applies only to samples taken after fasting for at least 8 hours.   BUN 06/30/2022 28 (H)  6 - 20 mg/dL Final   Creatinine, Ser 06/30/2022 1.18  0.61 - 1.24 mg/dL Final   Calcium 18/84/1660 9.0  8.9 - 10.3 mg/dL Final   GFR, Estimated 06/30/2022 >60  >60 mL/min Final   Comment: (NOTE) Calculated using the CKD-EPI Creatinine Equation (2021)    Anion gap 06/30/2022 6  5 - 15 Final   Performed at [facility], [address], [place], [place] [address]   Sodium 06/30/2022 140  135 - 145 mmol/L Final   Potassium 06/30/2022 3.7  3.5 - 5.1 mmol/L Final   Chloride 06/30/2022 105  98 - 111 mmol/L Final   BUN 06/30/2022 28 (H)  6 - 20 mg/dL Final   Creatinine, Ser 06/30/2022 1.10  0.61 - 1.24 mg/dL Final   Glucose, Bld 78/46/9629 147 (H)  70 - 99 mg/dL Final   Glucose reference range applies only to samples taken after fasting for at least 8 hours.   Calcium, Ion 06/30/2022 1.24  1.15 - 1.40 mmol/L Final   TCO2 06/30/2022 28  22 - 32 mmol/L Final   Hemoglobin 06/30/2022 12.6 (L)  13.0 - 17.0 g/dL Final   HCT 52/84/1324 37.0 (L)  39.0 - 52.0 % Final   B Natriuretic Peptide 06/30/2022 47.7  0.0 - 100.0 pg/mL Final   Performed at [facility], [address], [place], [place] [address]   Troponin I (High Sensitivity) 07/01/2022 4  <18 ng/L Final   Comment: (NOTE) Elevated high sensitivity troponin I (hsTnI) values and significant  changes across serial measurements may suggest ACS but many other  chronic and acute conditions are known to  elevate hsTnI results.  Refer to the "Links" section for chest pain algorithms and additional  guidance. Performed at [facility], [address] [place], [place] [address]   There may be more visits with results that are not included.    Blood Alcohol level:  Lab Results  Component Value Date   ETH <10 12/02/2022   ETH <10 11/17/2022    Metabolic Disorder Labs: Lab Results  Component Value Date   HGBA1C 5.9 (A) 07/12/2022   MPG 117 02/02/2009   No results found for: "PROLACTIN" Lab Results  Component Value Date   CHOL 203 (H) 10/31/2022   TRIG 196 (H) 10/31/2022   HDL 84 10/31/2022   CHOLHDL 2.4 10/31/2022   VLDL 39 10/31/2022   LDLCALC 80 10/31/2022   LDLCALC 63 10/08/2014    Therapeutic Lab Levels: No results found for: "LITHIUM" No results found for: "VALPROATE" No results found for: "CBMZ"  Physical Findings   AIMS    Flowsheet Row Admission (Discharged) from 02/02/2016 [facility] Admission (Discharged) from 03/22/2015 in [facility] Admission (Discharged) from 10/05/2014 in [facility]PATIENT BE[facility] Admission ([facility]charged) [facility]  AIMS Total Score 0 0 0 0      AUDIT    Flowsheet Row Admission (Discharged) from 11/17/2022 in [facility] INPATIENT BEHAVIORAL MEDICINE Admission (Discharged) from 10/27/2022 in [facility] INPATIENT BEHAVIORAL MEDICINE Admission (Discharged) from 03/22/2015 in [facility] Admission (Discharged) from 10/05/2014 in [facility] INPATIENT BEHAVIORAL MEDICINE Admission (Discharged) from 10/01/2014 in [facility]  Alcohol Use Disorder Identification Test Final Score (AUDIT) 14 14 25 18  34      PHQ2-9    Flowsheet Row ED from 12/03/2022 in [facility]  PHQ-2 Total Score 6  PHQ-9 Total Score 27      Flowsheet Row ED from 12/03/2022 in [facility] ED from 12/02/2022 in [facility] Emergency Department at [facility] Admission (Discharged) from 11/17/2022 in [facility] INPATIENT BEHAVIORAL MEDICINE  C-SSRS RISK CATEGORY No Risk Error: Q3, 4, or 5 should not be populated when Q2 is No Moderate Risk        Musculoskeletal  Strength & Muscle Tone: within normal limits Gait & Station: normal Patient leans: N/A  Psychiatric Specialty Exam  Presentation  General Appearance:  Appropriate for  Environment  Eye Contact: Fair  Speech: Clear and Coherent; Normal Rate  Speech Volume: Normal  Handedness: Right   Mood and Affect  Mood: Euthymic  Affect: Congruent; Appropriate   Thought Process  Thought Processes: Coherent; Goal Directed; Linear  Descriptions of Associations:Intact  Orientation:Full (Time, Place and Person)  Thought Content:Logical; WDL  Diagnosis of Schizophrenia or Schizoaffective disorder in past:    Hallucinations:Hallucinations: None  Ideas of Reference:None  Suicidal Thoughts:Suicidal Thoughts: No  Homicidal Thoughts:Homicidal Thoughts: No   Sensorium  Memory: Remote Good  Judgment: Fair  Insight: Fair   Art therapist  Concentration: Good  Attention Span: Good  Recall: Good  Fund of Knowledge: Good  Language: Good   Psychomotor Activity  Psychomotor Activity:Psychomotor Activity: Normal AIMS Completed?: No   Assets  Assets: Resilience; Desire for Improvement   Sleep  Sleep:Sleep: Fair   No data recorded   Physical Exam  Physical Exam Vitals and nursing note reviewed.  Neurological:     Mental Status: He is alert.  Psychiatric:        Mood and Affect: Mood normal.        Thought Content: Thought content normal.    Review of Systems  Psychiatric/Behavioral:  Positive for depression and substance abuse. The patient is nervous/anxious.    Blood pressure 128/78, pulse 65, temperature 98.7 F (37.1 C),  temperature source Oral, resp. rate 18, SpO2 99%. There is no height or weight on file to calculate BMI.  Treatment Plan Summary: Daily contact with patient to assess and evaluate symptoms and progress in treatment and Medication management  Alcohol Abuse/Use Disorder Alcohol Withdrawal -Ativan taper- ends 12/3 -D/c CIWA as scores have been <4 for >24 hours -Multivitamin with minerals daily -Tylenol 650 mg every 6 hours as needed for pain -Zofran 4 mg every 6 hours as needed for nausea or vomiting -Imodium 2 to 4 mg as needed for diarrhea or loose stools  -Maalox/Mylanta 30 mL every 4 hours as needed for indigestion -Milk of Mag 30 mL as needed for constipation   Cocaine abuse/misuse -Encourage abstinence  Substance-induced mood d/o Restarted Prozac 20 mg daily Restarted Risperdal 1 mg nightly Continue trazodone 50 mg nightly as needed   CSW to start working on discharge disposition Patient encouraged to participate in therapeutic milieu  Dispo: pending [facility]  Lance Muss, MD 12/07/2022 8:49 AM

## 2022-12-07 NOTE — Group Note (Signed)
Group Topic: Overcoming Obstacles  Group Date: 12/07/2022 Start Time: 0930 End Time: 1015 Facilitators: Londell Moh, NT  Department: [facility]  Number of Participants: 8  Group Focus: check in and daily focus Treatment Modality:  Psychoeducation Interventions utilized were patient education and support Purpose: express feelings and increase insight  Name: Franklin [name] Date of Birth: [birth year]  MR: [id number]    Level of Participation: Pt did not attend group. 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

## 2022-12-08 DIAGNOSIS — F1414 Cocaine abuse with cocaine-induced mood disorder: Secondary | ICD-10-CM | POA: Diagnosis not present

## 2022-12-08 DIAGNOSIS — F32A Depression, unspecified: Secondary | ICD-10-CM | POA: Diagnosis not present

## 2022-12-08 MED ORDER — TRAZODONE HCL 50 MG PO TABS: 50.0000 mg | ORAL_TABLET | Freq: Every evening | ORAL | 0 refills | Status: DC | PRN

## 2022-12-08 MED ORDER — RISPERIDONE 1 MG PO TABS: 1.0000 mg | ORAL_TABLET | Freq: Every day | ORAL | 0 refills | Status: DC

## 2022-12-08 MED ORDER — FLUOXETINE HCL 20 MG PO CAPS: 20.0000 mg | ORAL_CAPSULE | Freq: Every day | ORAL | 0 refills | Status: DC

## 2022-12-08 NOTE — Group Note (Signed)
Group Topic: Positive Affirmations  Group Date: 12/08/2022 Start Time: 1000 End Time: 1100 Facilitators: Ninfa Linden, NT +3 MHT 2 Department: [facility]  Number of Participants: 4  Group Focus: self-awareness Treatment Modality:  Psychoeducation Interventions utilized were group exercise Purpose: regain self-worth  Name: Franklin [name] Date of Birth: 1970/07/19  MR: [id number]    Level of Participation: active Quality of Participation: attentive Interactions with others: gave feedback Mood/Affect: appropriate Triggers (if applicable): N/A Cognition: coherent/clear Progress: Moderate Response: Approriate  Plan: patient will be encouraged to keep his options open.  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

## 2022-12-08 NOTE — ED Notes (Addendum)
Patient is sleeping. Respirations equal and unlabored, skin warm and dry. No change in assessment or acuity. Routine safety checks conducted according to facility protocol. Will continue to monitor for safety.   

## 2022-12-08 NOTE — ED Notes (Signed)
 Pt is in the dayroom watching TV with peers. Pt denies SI/HI/AVH. Pt has no further complain.No acute distress noted. Will continue to monitor for safety and provide support.

## 2022-12-08 NOTE — ED Notes (Signed)
Patient calm and cooperative on unit.  No complaints

## 2022-12-08 NOTE — ED Notes (Signed)
Patient is sleeping. Respirations equal and unlabored, skin warm and dry. No change in assessment or acuity. Routine safety checks conducted according to facility protocol. Will continue to monitor for safety.   

## 2022-12-08 NOTE — Discharge Planning (Signed)
Referral was received at [facility] from CM Ava, and per Marcelino Duster patient has been accepted and can transfer to the facility on tomorrow 12/09/2022 by 9:00am. Update has been provided to the patient and MD made aware. Patient was appreciative of the services provided by team. Patient will need a 14-30 day supply of medication and one month refill. No nicotine gum allowed, however 14-30 day nicotine patches to be provided if needed. Taxi voucher has been filled out and provided in chart for RN follow up in morning. No other needs to report at this time.    LCSW will continue to follow up and provide updates as received.    Franklin Boyden, LCSW Clinical Social Worker [facility] Ph: [phone]

## 2022-12-08 NOTE — Group Note (Signed)
Group Topic: Communication  Group Date: 12/08/2022 Start Time: 1950 End Time: 2020 Facilitators: Rae Lips B  Department: [facility]  Number of Participants: 5  Group Focus: check in Treatment Modality:  Individual Therapy Interventions utilized were support Purpose: express feelings and Check in and see how their day was and if there was anything they wanted to talk about.   Name: Franklin [name] Date of Birth: 1970-05-27  MR: [id number]    Level of Participation: active Quality of Participation: cooperative Interactions with others: gave feedback Mood/Affect: appropriate Triggers (if applicable): NA Cognition: coherent/clear Progress: Gaining insight Response: NA Plan: patient will be encouraged to keep attending groups.   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

## 2022-12-08 NOTE — Discharge Instructions (Addendum)
Follow-up recommendations:  Activity:  Normal, as tolerated Diet:  Per PCP recommendation  Patient is instructed prior to discharge to: Take all medications as prescribed by his mental healthcare provider. Report any adverse effects and/or reactions from the medicines to his outpatient provider promptly. Patient has been instructed & cautioned: To not engage in alcohol and or illegal drug use while on prescription medicines.  In the event of worsening symptoms, patient is instructed to call the crisis hotline at 988, 911 and or go to the nearest ED for appropriate evaluation and treatment of symptoms. To follow-up with his primary care provider for your other medical issues, concerns and or health care needs.  SUBSTANCE USE TREATMENT for Medicaid and State Funded/IPRS  [facility] [address], [place], [address] [phone] phone NOTE: [facility] is no longer offering IOP services.  Serves those who are low-income or have no insurance.  [facility] [address] [place], [place], [address] [phone] phone [phone] fax NOTE: Does have Substance Abuse-Intensive Outpatient Program [facility]) as well as transitional housing if eligible.  [facility] [address] [place], [place], [address] [phone] phone [phone] fax  [facility] [phone] [place], [place], [address] [phone] phone [phone] fax  HALFWAY HOUSES:  [facility] [phone]  [employer].oxfordvacancies.com  12 STEP PROGRAMS:  Alcoholics Anonymous of [place] [employer]  Narcotics Anonymous of [place] [employer]  Al-Anon of [place], [place] www.[place]alanon.org/find-meetings.html  Nar-Anon https://nar-anon.org/find-a-meetin  List of Residential placements:   [facility] in [place]: [phone]  [facility]:  [phone]  [place], [place] [phone]: Male and male facility; 30-day program: (uninsured and Medicaid such as Laurena Bering, [place], [place], partners)  [facility]: [phone]; men and women's facility; 28 days; Can have Medicaid tailored plan Tour manager or Partners)  [facility]: [phone] Karoline Caldwell or Larita Fife; 28 day program; must be fully detox; tailored Medicaid or no insurance  [address] in [place], [place]; [phone]; 28 day all males program; no insurance accepted  [facility] Referral in [place]: Gabriel Rung [phone] (no insurance or Medicaid only); 90 days; outpatient services but provide housing in apartments downtown [place]  [facility] Admission: [phone]: Patient must complete phone screening for placement: [place], [place]; 6 month program; uninsured, Medicaid, and [employer].   [facility]: no insurance required; [phone]  [facility]: [phone]; Intake: Molly Maduro; Must fill out application online; Alecia Lemming Delay [phone] x [address] [facility] in [place], [place]: [phone]; Admissions Coordinators Mr. Maurine Minister or Barron Alvine; 90 day program.  [facility]: [place], [place] [phone]; Co-Ed 9 month to a year program; Online application; Men entry fee is $500 (6-70months);  [employer]: [address] [place], [place] [address]; no fee or insurance required; minimum of 2 years; Highly structured; work based; Intake Coordinator is Thayer Ohm [phone]  [facility] in [place], [place]: [phone]; Fax number is [phone]; website: www.[facility].org; Requires 3-6 page autobiography; 2 year program (18 months and then 102month transitional housing); Admission fee is $300; no insurance needed; work Automotive engineer in [place], [place]: [employer] Desk Staff: Danise Edge [phone]: They have a Men's Regenerations Program 6-57months. Free program; There is an initial $300  fee however, they are willing to work with patients regarding that. Application is online.  [facility]: Admissions [phone] Doran Heater ext [phone]; Any 7-90 day program is out of pocket; 12 month program is free of charge; there is  a $275 entry fee; Patient is responsible for own transportation

## 2022-12-08 NOTE — Group Note (Signed)
Group Topic: Recovery Basics  Group Date: 12/08/2022 Start Time: 1200 End Time: 1220 Facilitators: Jenean Lindau, RN  Department: [facility]  Number of Participants: 7  Group Focus: chemical dependency education, chemical dependency issues, clarity of thought, and coping skills Treatment Modality:  Patient-Centered Therapy Interventions utilized were patient education Purpose: increase insight  Name: Franklin [name] Date of Birth: 1970/04/13  MR: [id number]    Level of Participation: moderate Quality of Participation: attentive Interactions with others: gave feedback Mood/Affect: appropriate Triggers (if applicable):   Cognition: coherent/clear Progress: Gaining insight Response:   Plan: follow-up needed  Patients Problems:  Patient Active Problem List   Diagnosis Date Noted   Cocaine abuse with cocaine-induced mood disorder (HCC) 12/03/2022   Major depressive disorder, 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

## 2022-12-08 NOTE — ED Provider Notes (Signed)
Behavioral Health Progress Note  Date and Time: 12/08/2022 10:43 AM Name: Calin Rominger MRN:  [id number]  Subjective:  Aodhan Mccalley is a 52 year old male with a past psychiatric history significant for alcohol use disorder and cocaine abuse with cocaine induced mood disorder who presents to [facility] from [facility] with a chief complaint of cocaine abuse with cocaine induced mood disorder.   Patient seen in in his room, no acute distress. Patient reports feeling "good" today. Patient reports good sleep and good appetite. Regarding withdrawal symptoms, he denies. Regarding cravings, he notes some cravings yesterday. Patient denies current SI, HI, and AVH. Regarding discharge plans, he is agreeable to going to [facility] in [place] tomorrow. Patient previously was accepted to [facility] in the past and left prematurely and when asked what is different this time, he states that back then, he did not have a plan in place but this time he is ready and feels regretful of last time.    Diagnosis:  Final diagnoses:  Cocaine abuse with cocaine-induced mood disorder (HCC)    Total Time spent with patient: 15 minutes  Past Psychiatric History: Alcohol use disorder Cocaine use disorder Substance abuse disorder Major depressive disorder, recurrent severe, without psychotic features Generalized anxiety disorder Past Medical History: Chronic obstructive pulmonary disease Premature ventricular contraction Solitary pulmonary nodule Prediabetes Family History: Patient has a family history of hypertension and diabetes Family Psychiatric  History:  Social History:  No pertinent social history  Additional Social History:        Sleep: Good  Appetite:  Good  Current Medications:  Current Facility-Administered Medications  Medication Dose Route Frequency Provider Last Rate Last Admin   acetaminophen (TYLENOL) tablet 650 mg  650 mg Oral Q6H  PRN Nwoko, Uchenna E, PA   650 mg at 12/03/22 2002   alum & mag hydroxide-simeth (MAALOX/MYLANTA) 200-200-20 MG/5ML suspension 30 mL  30 mL Oral Q4H PRN Nwoko, Uchenna E, PA       diphenhydrAMINE (BENADRYL) capsule 50 mg  50 mg Oral Q6H PRN Nwoko, Uchenna E, PA       Or   diphenhydrAMINE (BENADRYL) injection 50 mg  50 mg Intramuscular Q6H PRN Nwoko, Uchenna E, PA       FLUoxetine (PROZAC) capsule 20 mg  20 mg Oral Daily Oneta Rack, NP   20 mg at 12/08/22 0936   haloperidol (HALDOL) tablet 5 mg  5 mg Oral Q6H PRN Nwoko, Uchenna E, PA       Or   haloperidol lactate (HALDOL) injection 5 mg  5 mg Intramuscular Q6H PRN Nwoko, Uchenna E, PA       hydrOXYzine (ATARAX) tablet 25 mg  25 mg Oral TID PRN Nwoko, Uchenna E, PA       LORazepam (ATIVAN) tablet 2 mg  2 mg Oral Q6H PRN Nwoko, Uchenna E, PA       Or   LORazepam (ATIVAN) injection 2 mg  2 mg Intramuscular Q6H PRN Nwoko, Uchenna E, PA       magnesium hydroxide (MILK OF MAGNESIA) suspension 30 mL  30 mL Oral Daily PRN Nwoko, Uchenna E, PA       multivitamin with minerals tablet 1 tablet  1 tablet Oral Daily Nwoko, Uchenna E, PA   1 tablet at 12/08/22 0936   risperiDONE (RISPERDAL) tablet 1 mg  1 mg Oral QHS Oneta Rack, NP   1 mg at 12/07/22 2102   thiamine (VITAMIN B1) tablet 100  mg  100 mg Oral Daily Nwoko, Uchenna E, PA   100 mg at 12/08/22 0936   traZODone (DESYREL) tablet 50 mg  50 mg Oral QHS PRN Nwoko, Uchenna E, PA   50 mg at 12/07/22 2102   Current Outpatient Medications  Medication Sig Dispense Refill   risperiDONE (RISPERDAL) 1 MG tablet Take 1 tablet (1 mg total) by mouth at bedtime. 30 tablet 0   traZODone (DESYREL) 50 MG tablet Take 1 tablet (50 mg total) by mouth at bedtime as needed for sleep. 30 tablet 0    Labs  Lab Results:  Admission on 12/02/2022, Discharged on 12/03/2022  Component Date Value Ref Range Status   Sodium 12/02/2022 139  135 - 145 mmol/L Final   Potassium 12/02/2022 3.9  3.5 - 5.1 mmol/L Final    Chloride 12/02/2022 103  98 - 111 mmol/L Final   CO2 12/02/2022 26  22 - 32 mmol/L Final   Glucose, Bld 12/02/2022 162 (H)  70 - 99 mg/dL Final   Glucose reference range applies only to samples taken after fasting for at least 8 hours.   BUN 12/02/2022 16  6 - 20 mg/dL Final   Creatinine, Ser 12/02/2022 1.11  0.61 - 1.24 mg/dL Final   Calcium 01/06/7251 9.0  8.9 - 10.3 mg/dL Final   Total Protein 66/44/0347 6.8  6.5 - 8.1 g/dL Final   Albumin 42/59/5638 4.0  3.5 - 5.0 g/dL Final   AST 75/64/3329 29  15 - 41 U/L Final   ALT 12/02/2022 19  0 - 44 U/L Final   Alkaline Phosphatase 12/02/2022 85  38 - 126 U/L Final   Total Bilirubin 12/02/2022 0.8  <1.2 mg/dL Final   GFR, Estimated 12/02/2022 >60  >60 mL/min Final   Comment: (NOTE) Calculated using the CKD-EPI Creatinine Equation (2021)    Anion gap 12/02/2022 10  5 - 15 Final   Performed at [facility], [address] [place], [place] [address]   WBC 12/02/2022 5.9  4.0 - 10.5 K/uL Final   RBC 12/02/2022 4.21 (L)  4.22 - 5.81 MIL/uL Final   Hemoglobin 12/02/2022 12.8 (L)  13.0 - 17.0 g/dL Final   HCT 16/60/6301 38.5 (L)  39.0 - 52.0 % Final   MCV 12/02/2022 91.4  80.0 - 100.0 fL Final   MCH 12/02/2022 30.4  26.0 - 34.0 pg Final   MCHC 12/02/2022 33.2  30.0 - 36.0 g/dL Final   RDW 60/10/9321 14.4  11.5 - 15.5 % Final   Platelets 12/02/2022 311  150 - 400 K/uL Final   nRBC 12/02/2022 0.0  0.0 - 0.2 % Final   Performed at [facility], [address] [place], [place] [address]   Tricyclic, Ur Screen 12/02/2022 NONE DETECTED  NONE DETECTED Final   Amphetamines, Ur Screen 12/02/2022 NONE DETECTED  NONE DETECTED Final   MDMA (Ecstasy)Ur Screen 12/02/2022 NONE DETECTED  NONE DETECTED Final   Cocaine Metabolite,Ur [place] 12/02/2022 POSITIVE (A)  NONE DETECTED Final   Opiate, Ur Screen 12/02/2022 NONE DETECTED  NONE DETECTED Final   Phencyclidine (PCP) Ur S 12/02/2022 NONE DETECTED  NONE DETECTED Final   Cannabinoid 50 Ng,  Ur [place] 12/02/2022 NONE DETECTED  NONE DETECTED Final   Barbiturates, Ur Screen 12/02/2022 NONE DETECTED  NONE DETECTED Final   Benzodiazepine, Ur Scrn 12/02/2022 POSITIVE (A)  NONE DETECTED Final   Methadone Scn, Ur 12/02/2022 NONE DETECTED  NONE DETECTED Final   Comment: (NOTE) Tricyclics + metabolites, urine  Cutoff 1000 ng/mL Amphetamines + metabolites, urine  Cutoff 1000 ng/mL MDMA (Ecstasy), urine              Cutoff 500 ng/mL Cocaine Metabolite, urine          Cutoff 300 ng/mL Opiate + metabolites, urine        Cutoff 300 ng/mL Phencyclidine (PCP), urine         Cutoff 25 ng/mL Cannabinoid, urine                 Cutoff 50 ng/mL Barbiturates + metabolites, urine  Cutoff 200 ng/mL Benzodiazepine, urine              Cutoff 200 ng/mL Methadone, urine                   Cutoff 300 ng/mL  The urine drug screen provides only a preliminary, unconfirmed analytical test result and should not be used for non-medical purposes. Clinical consideration and professional judgment should be applied to any positive drug screen result due to possible interfering substances. A more specific alternate chemical method must be used in order to obtain a confirmed analytical result. Gas chromatography / mass spectrometry (GC/MS) is the preferred confirm                          atory method. Performed at [facility], [address] [place], [place] [address]    Acetaminophen (Tylenol), Serum 12/02/2022 <10 (L)  10 - 30 ug/mL Final   Comment: (NOTE) Therapeutic concentrations vary significantly. A range of 10-30 ug/mL  may be an effective concentration for many patients. However, some  are best treated at concentrations outside of this range. Acetaminophen concentrations >150 ug/mL at 4 hours after ingestion  and >50 ug/mL at 12 hours after ingestion are often associated with  toxic reactions.  Performed at [facility], [address] [place], [place] [address]    Alcohol,  Ethyl (B) 12/02/2022 <10  <10 mg/dL Final   Comment: (NOTE) Lowest detectable limit for serum alcohol is 10 mg/dL.  For medical purposes only. Performed at [facility], [address] [place], [place] [address]    Salicylate Lvl 12/02/2022 <7.0 (L)  7.0 - 30.0 mg/dL Final   Performed at [facility], [address] [place], [place] [address]  Admission on 11/17/2022, Discharged on 11/17/2022  Component Date Value Ref Range Status   Sodium 11/17/2022 139  135 - 145 mmol/L Final   Potassium 11/17/2022 4.1  3.5 - 5.1 mmol/L Final   Chloride 11/17/2022 100  98 - 111 mmol/L Final   CO2 11/17/2022 29  22 - 32 mmol/L Final   Glucose, Bld 11/17/2022 90  70 - 99 mg/dL Final   Glucose reference range applies only to samples taken after fasting for at least 8 hours.   BUN 11/17/2022 10  6 - 20 mg/dL Final   Creatinine, Ser 11/17/2022 0.68  0.61 - 1.24 mg/dL Final   Calcium 84/13/2440 9.0  8.9 - 10.3 mg/dL Final   Total Protein 10/31/2534 8.1  6.5 - 8.1 g/dL Final   Albumin 64/40/3474 4.6  3.5 - 5.0 g/dL Final   AST 25/95/6387 22  15 - 41 U/L Final   ALT 11/17/2022 20  0 - 44 U/L Final   Alkaline Phosphatase 11/17/2022 61  38 - 126 U/L Final   Total Bilirubin 11/17/2022 1.1  <1.2 mg/dL Final   GFR, Estimated 11/17/2022 >  60  >60 mL/min Final   Comment: (NOTE) Calculated using the CKD-EPI Creatinine Equation (2021)    Anion gap 11/17/2022 10  5 - 15 Final   Performed at [facility], [address] [place], [place] [address]   Alcohol, Ethyl (B) 11/17/2022 <10  <10 mg/dL Final   Comment: (NOTE) Lowest detectable limit for serum alcohol is 10 mg/dL.  For medical purposes only. Performed at [facility], [address] [place], [place] [address]    Salicylate Lvl 11/17/2022 <7.0 (L)  7.0 - 30.0 mg/dL Final   Performed at [facility], [address] [place], [place] [address]   Acetaminophen (Tylenol), Serum 11/17/2022 <10 (L)  10 -  30 ug/mL Final   Comment: (NOTE) Therapeutic concentrations vary significantly. A range of 10-30 ug/mL  may be an effective concentration for many patients. However, some  are best treated at concentrations outside of this range. Acetaminophen concentrations >150 ug/mL at 4 hours after ingestion  and >50 ug/mL at 12 hours after ingestion are often associated with  toxic reactions.  Performed at [facility], [address] [place], [place] [address]    WBC 11/17/2022 7.6  4.0 - 10.5 K/uL Final   RBC 11/17/2022 5.07  4.22 - 5.81 MIL/uL Final   Hemoglobin 11/17/2022 15.2  13.0 - 17.0 g/dL Final   HCT 96/29/5284 46.8  39.0 - 52.0 % Final   MCV 11/17/2022 92.3  80.0 - 100.0 fL Final   MCH 11/17/2022 30.0  26.0 - 34.0 pg Final   MCHC 11/17/2022 32.5  30.0 - 36.0 g/dL Final   RDW 13/24/4010 14.6  11.5 - 15.5 % Final   Platelets 11/17/2022 345  150 - 400 K/uL Final   nRBC 11/17/2022 0.0  0.0 - 0.2 % Final   Performed at [facility], [address] [place], [place] [address]   Tricyclic, Ur Screen 11/17/2022 NONE DETECTED  NONE DETECTED Final   Amphetamines, Ur Screen 11/17/2022 NONE DETECTED  NONE DETECTED Final   MDMA (Ecstasy)Ur Screen 11/17/2022 NONE DETECTED  NONE DETECTED Final   Cocaine Metabolite,Ur [place] 11/17/2022 POSITIVE (A)  NONE DETECTED Final   Opiate, Ur Screen 11/17/2022 NONE DETECTED  NONE DETECTED Final   Phencyclidine (PCP) Ur S 11/17/2022 NONE DETECTED  NONE DETECTED Final   Cannabinoid 50 Ng, Ur [place] 11/17/2022 POSITIVE (A)  NONE DETECTED Final   Barbiturates, Ur Screen 11/17/2022 NONE DETECTED  NONE DETECTED Final   Benzodiazepine, Ur Scrn 11/17/2022 POSITIVE (A)  NONE DETECTED Final   Methadone Scn, Ur 11/17/2022 NONE DETECTED  NONE DETECTED Final   Comment: (NOTE) Tricyclics + metabolites, urine    Cutoff 1000 ng/mL Amphetamines + metabolites, urine  Cutoff 1000 ng/mL MDMA (Ecstasy), urine              Cutoff 500 ng/mL Cocaine Metabolite, urine           Cutoff 300 ng/mL Opiate + metabolites, urine        Cutoff 300 ng/mL Phencyclidine (PCP), urine         Cutoff 25 ng/mL Cannabinoid, urine                 Cutoff 50 ng/mL Barbiturates + metabolites, urine  Cutoff 200 ng/mL Benzodiazepine, urine              Cutoff 200 ng/mL Methadone, urine                   Cutoff 300  ng/mL  The urine drug screen provides only a preliminary, unconfirmed analytical test result and should not be used for non-medical purposes. Clinical consideration and professional judgment should be applied to any positive drug screen result due to possible interfering substances. A more specific alternate chemical method must be used in order to obtain a confirmed analytical result. Gas chromatography / mass spectrometry (GC/MS) is the preferred confirm                          atory method. Performed at [facility], [address] [place], [place] [address]   Admission on 11/13/2022, Discharged on 11/13/2022  Component Date Value Ref Range Status   Sodium 11/13/2022 141  135 - 145 mmol/L Final   Potassium 11/13/2022 4.0  3.5 - 5.1 mmol/L Final   Chloride 11/13/2022 108  98 - 111 mmol/L Final   CO2 11/13/2022 26  22 - 32 mmol/L Final   Glucose, Bld 11/13/2022 126 (H)  70 - 99 mg/dL Final   Glucose reference range applies only to samples taken after fasting for at least 8 hours.   BUN 11/13/2022 14  6 - 20 mg/dL Final   Creatinine, Ser 11/13/2022 0.91  0.61 - 1.24 mg/dL Final   Calcium 82/95/6213 9.1  8.9 - 10.3 mg/dL Final   Total Protein 08/65/7846 7.2  6.5 - 8.1 g/dL Final   Albumin 96/29/5284 4.1  3.5 - 5.0 g/dL Final   AST 13/24/4010 30  15 - 41 U/L Final   ALT 11/13/2022 23  0 - 44 U/L Final   Alkaline Phosphatase 11/13/2022 76  38 - 126 U/L Final   Total Bilirubin 11/13/2022 1.9 (H)  <1.2 mg/dL Final   GFR, Estimated 11/13/2022 >60  >60 mL/min Final   Comment: (NOTE) Calculated using the CKD-EPI Creatinine Equation (2021)    Anion  gap 11/13/2022 7  5 - 15 Final   Performed at [facility], [address], [place], [place] [address]   Alcohol, Ethyl (B) 11/13/2022 <10  <10 mg/dL Final   Comment: (NOTE) Lowest detectable limit for serum alcohol is 10 mg/dL.  For medical purposes only. Performed at [facility], [address] [place], [place] [address]    Salicylate Lvl 11/13/2022 <7.0 (L)  7.0 - 30.0 mg/dL Final   Performed at [facility], [address], [place], [place] [address]   Acetaminophen (Tylenol), Serum 11/13/2022 <10 (L)  10 - 30 ug/mL Final   Comment: (NOTE) Therapeutic concentrations vary significantly. A range of 10-30 ug/mL  may be an effective concentration for many patients. However, some  are best treated at concentrations outside of this range. Acetaminophen concentrations >150 ug/mL at 4 hours after ingestion  and >50 ug/mL at 12 hours after ingestion are often associated with  toxic reactions.  Performed at [facility], [address] [place], [place] [address]    WBC 11/13/2022 7.6  4.0 - 10.5 K/uL Final   RBC 11/13/2022 4.70  4.22 - 5.81 MIL/uL Final   Hemoglobin 11/13/2022 14.2  13.0 - 17.0 g/dL Final   HCT 87/56/4332 43.6  39.0 - 52.0 % Final   MCV 11/13/2022 92.8  80.0 - 100.0 fL Final   MCH 11/13/2022 30.2  26.0 - 34.0 pg Final   MCHC 11/13/2022 32.6  30.0 - 36.0 g/dL Final   RDW 95/18/8416 15.0  11.5 - 15.5 % Final   Platelets 11/13/2022 308  150 - 400 K/uL Final   nRBC 11/13/2022 0.0  0.0 - 0.2 % Final   Performed at [facility], [address] [place], [place] [address]   Opiates 11/13/2022 NONE DETECTED  NONE DETECTED Final   Cocaine 11/13/2022 POSITIVE (A)  NONE DETECTED Final   Benzodiazepines 11/13/2022 POSITIVE (A)  NONE DETECTED Final   Amphetamines 11/13/2022 NONE DETECTED  NONE DETECTED Final   Tetrahydrocannabinol 11/13/2022 POSITIVE (A)  NONE DETECTED Final    Barbiturates 11/13/2022 NONE DETECTED  NONE DETECTED Final   Comment: (NOTE) DRUG SCREEN FOR MEDICAL PURPOSES ONLY.  IF CONFIRMATION IS NEEDED FOR ANY PURPOSE, NOTIFY LAB WITHIN 5 DAYS.  LOWEST DETECTABLE LIMITS FOR URINE DRUG SCREEN Drug Class                     Cutoff (ng/mL) Amphetamine and metabolites    1000 Barbiturate and metabolites    200 Benzodiazepine                 200 Opiates and metabolites        300 Cocaine and metabolites        300 THC                            50 Performed at [facility], [address], [place], [place] [address]   Admission on 11/07/2022, Discharged on 11/07/2022  Component Date Value Ref Range Status   Acetaminophen (Tylenol), Serum 11/07/2022 <10 (L)  10 - 30 ug/mL Final   Comment: (NOTE) Therapeutic concentrations vary significantly. A range of 10-30 ug/mL  may be an effective concentration for many patients. However, some  are best treated at concentrations outside of this range. Acetaminophen concentrations >150 ug/mL at 4 hours after ingestion  and >50 ug/mL at 12 hours after ingestion are often associated with  toxic reactions.  Performed at [facility], [address] [place], [place] [address]    Sodium 11/07/2022 137  135 - 145 mmol/L Final   Potassium 11/07/2022 3.7  3.5 - 5.1 mmol/L Final   Chloride 11/07/2022 107  98 - 111 mmol/L Final   CO2 11/07/2022 22  22 - 32 mmol/L Final   Glucose, Bld 11/07/2022 147 (H)  70 - 99 mg/dL Final   Glucose reference range applies only to samples taken after fasting for at least 8 hours.   BUN 11/07/2022 18  6 - 20 mg/dL Final   Creatinine, Ser 11/07/2022 0.93  0.61 - 1.24 mg/dL Final   Calcium 21/30/8657 8.5 (L)  8.9 - 10.3 mg/dL Final   Total Protein 84/69/6295 6.5  6.5 - 8.1 g/dL Final   Albumin 28/41/3244 3.5  3.5 - 5.0 g/dL Final   AST 01/06/7251 24  15 - 41 U/L Final   ALT 11/07/2022 21  0 - 44 U/L Final   Alkaline Phosphatase 11/07/2022 65  38 -  126 U/L Final   Total Bilirubin 11/07/2022 1.0  0.3 - 1.2 mg/dL Final   GFR, Estimated 11/07/2022 >60  >60 mL/min Final   Comment: (NOTE) Calculated using the CKD-EPI Creatinine Equation (2021)    Anion gap 11/07/2022 8  5 - 15 Final   Performed at [facility], [address] [place], [place] [address]   Alcohol, Ethyl (B) 11/07/2022 <10  <10 mg/dL Final   Comment: (NOTE) Lowest detectable limit for serum alcohol is 10 mg/dL.  For medical purposes only.  Performed at [facility], [address] [place], [place] [address]    Salicylate Lvl 11/07/2022 <7.0 (L)  7.0 - 30.0 mg/dL Final   Performed at [facility], [address] [place], [place] [address]   WBC 11/07/2022 10.9 (H)  4.0 - 10.5 K/uL Final   RBC 11/07/2022 3.91 (L)  4.22 - 5.81 MIL/uL Final   Hemoglobin 11/07/2022 11.8 (L)  13.0 - 17.0 g/dL Final   HCT 09/81/1914 35.3 (L)  39.0 - 52.0 % Final   MCV 11/07/2022 90.3  80.0 - 100.0 fL Final   MCH 11/07/2022 30.2  26.0 - 34.0 pg Final   MCHC 11/07/2022 33.4  30.0 - 36.0 g/dL Final   RDW 78/29/5621 14.9  11.5 - 15.5 % Final   Platelets 11/07/2022 254  150 - 400 K/uL Final   nRBC 11/07/2022 0.0  0.0 - 0.2 % Final   Neutrophils Relative % 11/07/2022 84  % Final   Neutro Abs 11/07/2022 9.1 (H)  1.7 - 7.7 K/uL Final   Lymphocytes Relative 11/07/2022 7  % Final   Lymphs Abs 11/07/2022 0.8  0.7 - 4.0 K/uL Final   Monocytes Relative 11/07/2022 6  % Final   Monocytes Absolute 11/07/2022 0.6  0.1 - 1.0 K/uL Final   Eosinophils Relative 11/07/2022 2  % Final   Eosinophils Absolute 11/07/2022 0.2  0.0 - 0.5 K/uL Final   Basophils Relative 11/07/2022 1  % Final   Basophils Absolute 11/07/2022 0.1  0.0 - 0.1 K/uL Final   Immature Granulocytes 11/07/2022 0  % Final   Abs Immature Granulocytes 11/07/2022 0.04  0.00 - 0.07 K/uL Final   Performed at [facility], [address] [place], [place] [address]  Admission on 10/27/2022, Discharged on  11/01/2022  Component Date Value Ref Range Status   Cholesterol 10/31/2022 203 (H)  0 - 200 mg/dL Final   Triglycerides 78/46/9629 196 (H)  <150 mg/dL Final   HDL 52/84/1324 84  >40 mg/dL Final   Total CHOL/HDL Ratio 10/31/2022 2.4  RATIO Final   VLDL 10/31/2022 39  0 - 40 mg/dL Final   LDL Cholesterol 10/31/2022 80  0 - 99 mg/dL Final   Comment:        Total Cholesterol/HDL:CHD Risk Coronary Heart Disease Risk Table                     Men   Women  1/2 Average Risk   3.4   3.3  Average Risk       5.0   4.4  2 X Average Risk   9.6   7.1  3 X Average Risk  23.4   11.0        Use the calculated Patient Ratio above and the CHD Risk Table to determine the patient's CHD Risk.        ATP III CLASSIFICATION (LDL):  <100     mg/dL   Optimal  401-027  mg/dL   Near or Above                    Optimal  130-159  mg/dL   Borderline  253-664  mg/dL   High  >403     mg/dL   Very High Performed at [facility], [address] [place], [place] [address]   Admission on 10/26/2022, Discharged on 10/27/2022  Component Date Value Ref Range Status   WBC 10/26/2022 9.6  4.0 - 10.5 K/uL Final  RBC 10/26/2022 4.98  4.22 - 5.81 MIL/uL Final   Hemoglobin 10/26/2022 14.7  13.0 - 17.0 g/dL Final   HCT 95/62/1308 44.1  39.0 - 52.0 % Final   MCV 10/26/2022 88.6  80.0 - 100.0 fL Final   MCH 10/26/2022 29.5  26.0 - 34.0 pg Final   MCHC 10/26/2022 33.3  30.0 - 36.0 g/dL Final   RDW 65/78/4696 14.3  11.5 - 15.5 % Final   Platelets 10/26/2022 378  150 - 400 K/uL Final   nRBC 10/26/2022 0.0  0.0 - 0.2 % Final   Performed at [facility], [address] [place], [place] [address]   Sodium 10/26/2022 135  135 - 145 mmol/L Final   Potassium 10/26/2022 4.1  3.5 - 5.1 mmol/L Final   Chloride 10/26/2022 101  98 - 111 mmol/L Final   CO2 10/26/2022 23  22 - 32 mmol/L Final   Glucose, Bld 10/26/2022 93  70 - 99 mg/dL Final   Glucose reference range applies only to samples taken after fasting  for at least 8 hours.   BUN 10/26/2022 15  6 - 20 mg/dL Final   Creatinine, Ser 10/26/2022 0.89  0.61 - 1.24 mg/dL Final   Calcium 41/32/4401 9.2  8.9 - 10.3 mg/dL Final   GFR, Estimated 10/26/2022 >60  >60 mL/min Final   Comment: (NOTE) Calculated using the CKD-EPI Creatinine Equation (2021)    Anion gap 10/26/2022 11  5 - 15 Final   Performed at [facility], [address] [place], [place] [address]   Troponin I (High Sensitivity) 10/26/2022 4  <18 ng/L Final   Comment: (NOTE) Elevated high sensitivity troponin I (hsTnI) values and significant  changes across serial measurements may suggest ACS but many other  chronic and acute conditions are known to elevate hsTnI results.  Refer to the "Links" section for chest pain algorithms and additional  guidance. Performed at [facility], [address] [place], [place] [address]    Acetaminophen (Tylenol), Serum 10/26/2022 <10 (L)  10 - 30 ug/mL Final   Comment: (NOTE) Therapeutic concentrations vary significantly. A range of 10-30 ug/mL  may be an effective concentration for many patients. However, some  are best treated at concentrations outside of this range. Acetaminophen concentrations >150 ug/mL at 4 hours after ingestion  and >50 ug/mL at 12 hours after ingestion are often associated with  toxic reactions.  Performed at [facility], [address] [place], [place] [address]    Salicylate Lvl 10/26/2022 <7.0 (L)  7.0 - 30.0 mg/dL Final   Performed at [facility], [address] [place], [place] [address]   Alcohol, Ethyl (B) 10/26/2022 <10  <10 mg/dL Final   Comment: (NOTE) Lowest detectable limit for serum alcohol is 10 mg/dL.  For medical purposes only. Performed at [facility], [address] [place], [place] [address]    SARS Coronavirus 2 by RT PCR 10/26/2022 NEGATIVE  NEGATIVE Final   Comment: (NOTE) SARS-CoV-2 target nucleic acids are NOT DETECTED.  The  SARS-CoV-2 RNA is generally detectable in upper respiratory specimens during the acute phase of infection. The lowest concentration of SARS-CoV-2 viral copies this assay can detect is 138 copies/mL. A negative result does not preclude SARS-Cov-2 infection and should not be used as the sole basis for treatment or other patient management decisions. A negative result may occur with  improper specimen collection/handling, submission of specimen other than nasopharyngeal swab, presence of viral mutation(s) within the areas  targeted by this assay, and inadequate number of viral copies(<138 copies/mL). A negative result must be combined with clinical observations, patient history, and epidemiological information. The expected result is Negative.  Fact Sheet for Patients:  BloggerCourse.com  Fact Sheet for Healthcare Providers:  SeriousBroker.it  This test is no                          t yet approved or cleared by the [place] FDA and  has been authorized for detection and/or diagnosis of SARS-CoV-2 by FDA under an Emergency Use Authorization (EUA). This EUA will remain  in effect (meaning this test can be used) for the duration of the COVID-19 declaration under Section 564(b)(1) of the Act, 21 U.S.C.section 360bbb-3(b)(1), unless the authorization is terminated  or revoked sooner.       Influenza A by PCR 10/26/2022 NEGATIVE  NEGATIVE Final   Influenza B by PCR 10/26/2022 NEGATIVE  NEGATIVE Final   Comment: (NOTE) The Xpert Xpress SARS-CoV-2/FLU/RSV plus assay is intended as an aid in the diagnosis of influenza from Nasopharyngeal swab specimens and should not be used as a sole basis for treatment. Nasal washings and aspirates are unacceptable for Xpert Xpress SARS-CoV-2/FLU/RSV testing.  Fact Sheet for Patients: BloggerCourse.com  Fact Sheet for Healthcare  Providers: SeriousBroker.it  This test is not yet approved or cleared by the [place] FDA and has been authorized for detection and/or diagnosis of SARS-CoV-2 by FDA under an Emergency Use Authorization (EUA). This EUA will remain in effect (meaning this test can be used) for the duration of the COVID-19 declaration under Section 564(b)(1) of the Act, 21 U.S.C. section 360bbb-3(b)(1), unless the authorization is terminated or revoked.     Resp Syncytial Virus by PCR 10/26/2022 NEGATIVE  NEGATIVE Final   Comment: (NOTE) Fact Sheet for Patients: BloggerCourse.com  Fact Sheet for Healthcare Providers: SeriousBroker.it  This test is not yet approved or cleared by the [place] FDA and has been authorized for detection and/or diagnosis of SARS-CoV-2 by FDA under an Emergency Use Authorization (EUA). This EUA will remain in effect (meaning this test can be used) for the duration of the COVID-19 declaration under Section 564(b)(1) of the Act, 21 U.S.C. section 360bbb-3(b)(1), unless the authorization is terminated or revoked.  Performed at [facility], [address] [place], [place] [address]    Troponin I (High Sensitivity) 10/26/2022 5  <18 ng/L Final   Comment: (NOTE) Elevated high sensitivity troponin I (hsTnI) values and significant  changes across serial measurements may suggest ACS but many other  chronic and acute conditions are known to elevate hsTnI results.  Refer to the "Links" section for chest pain algorithms and additional  guidance. Performed at [facility], [address] [place], [place] [address]    Tricyclic, Ur Screen 10/26/2022 NONE DETECTED  NONE DETECTED Final   Amphetamines, Ur Screen 10/26/2022 NONE DETECTED  NONE DETECTED Final   MDMA (Ecstasy)Ur Screen 10/26/2022 NONE DETECTED  NONE DETECTED Final   Cocaine Metabolite,Ur [place] 10/26/2022 POSITIVE  (A)  NONE DETECTED Final   Opiate, Ur Screen 10/26/2022 NONE DETECTED  NONE DETECTED Final   Phencyclidine (PCP) Ur S 10/26/2022 NONE DETECTED  NONE DETECTED Final   Cannabinoid 50 Ng, Ur [place] 10/26/2022 NONE DETECTED  NONE DETECTED Final   Barbiturates, Ur Screen 10/26/2022 NONE DETECTED  NONE DETECTED Final   Benzodiazepine, Ur Scrn 10/26/2022 POSITIVE (A)  NONE DETECTED Final   Methadone Scn, Ur 10/26/2022  NONE DETECTED  NONE DETECTED Final   Comment: (NOTE) Tricyclics + metabolites, urine    Cutoff 1000 ng/mL Amphetamines + metabolites, urine  Cutoff 1000 ng/mL MDMA (Ecstasy), urine              Cutoff 500 ng/mL Cocaine Metabolite, urine          Cutoff 300 ng/mL Opiate + metabolites, urine        Cutoff 300 ng/mL Phencyclidine (PCP), urine         Cutoff 25 ng/mL Cannabinoid, urine                 Cutoff 50 ng/mL Barbiturates + metabolites, urine  Cutoff 200 ng/mL Benzodiazepine, urine              Cutoff 200 ng/mL Methadone, urine                   Cutoff 300 ng/mL  The urine drug screen provides only a preliminary, unconfirmed analytical test result and should not be used for non-medical purposes. Clinical consideration and professional judgment should be applied to any positive drug screen result due to possible interfering substances. A more specific alternate chemical method must be used in order to obtain a confirmed analytical result. Gas chromatography / mass spectrometry (GC/MS) is the preferred confirm                          atory method. Performed at [facility], [address] [place], [place] [address]   Admission on 10/22/2022, Discharged on 10/24/2022  Component Date Value Ref Range Status   Sodium 10/22/2022 139  135 - 145 mmol/L Final   Potassium 10/22/2022 3.5  3.5 - 5.1 mmol/L Final   Chloride 10/22/2022 104  98 - 111 mmol/L Final   CO2 10/22/2022 27  22 - 32 mmol/L Final   Glucose, Bld 10/22/2022 128 (H)  70 - 99 mg/dL Final   Glucose  reference range applies only to samples taken after fasting for at least 8 hours.   BUN 10/22/2022 14  6 - 20 mg/dL Final   Creatinine, Ser 10/22/2022 0.76  0.61 - 1.24 mg/dL Final   Calcium 54/62/7035 8.9  8.9 - 10.3 mg/dL Final   Total Protein 00/93/8182 7.8  6.5 - 8.1 g/dL Final   Albumin 99/37/1696 4.4  3.5 - 5.0 g/dL Final   AST 78/93/8101 22  15 - 41 U/L Final   ALT 10/22/2022 19  0 - 44 U/L Final   Alkaline Phosphatase 10/22/2022 69  38 - 126 U/L Final   Total Bilirubin 10/22/2022 1.1  0.3 - 1.2 mg/dL Final   GFR, Estimated 10/22/2022 >60  >60 mL/min Final   Comment: (NOTE) Calculated using the CKD-EPI Creatinine Equation (2021)    Anion gap 10/22/2022 8  5 - 15 Final   Performed at [facility], [address] [place], [place] [address]   WBC 10/22/2022 11.9 (H)  4.0 - 10.5 K/uL Final   RBC 10/22/2022 4.85  4.22 - 5.81 MIL/uL Final   Hemoglobin 10/22/2022 14.4  13.0 - 17.0 g/dL Final   HCT 58/52/7782 44.3  39.0 - 52.0 % Final   MCV 10/22/2022 91.3  80.0 - 100.0 fL Final   MCH 10/22/2022 29.7  26.0 - 34.0 pg Final   MCHC 10/22/2022 32.5  30.0 - 36.0 g/dL Final   RDW 42/35/3614 13.9  11.5 - 15.5 % Final   Platelets 10/22/2022 279  150 - 400 K/uL Final   nRBC 10/22/2022 0.0  0.0 - 0.2 % Final   Neutrophils Relative % 10/22/2022 72  % Final   Neutro Abs 10/22/2022 8.5 (H)  1.7 - 7.7 K/uL Final   Lymphocytes Relative 10/22/2022 14  % Final   Lymphs Abs 10/22/2022 1.7  0.7 - 4.0 K/uL Final   Monocytes Relative 10/22/2022 8  % Final   Monocytes Absolute 10/22/2022 1.0  0.1 - 1.0 K/uL Final   Eosinophils Relative 10/22/2022 5  % Final   Eosinophils Absolute 10/22/2022 0.7 (H)  0.0 - 0.5 K/uL Final   Basophils Relative 10/22/2022 1  % Final   Basophils Absolute 10/22/2022 0.1  0.0 - 0.1 K/uL Final   Immature Granulocytes 10/22/2022 0  % Final   Abs Immature Granulocytes 10/22/2022 0.04  0.00 - 0.07 K/uL Final   Performed at [facility], [address]  [place], [place] [address]   Troponin I (High Sensitivity) 10/22/2022 5  <18 ng/L Final   Comment: (NOTE) Elevated high sensitivity troponin I (hsTnI) values and significant  changes across serial measurements may suggest ACS but many other  chronic and acute conditions are known to elevate hsTnI results.  Refer to the "Links" section for chest pain algorithms and additional  guidance. Performed at [facility], [address] [place], [place] [address]    Troponin I (High Sensitivity) 10/22/2022 5  <18 ng/L Final   Comment: (NOTE) Elevated high sensitivity troponin I (hsTnI) values and significant  changes across serial measurements may suggest ACS but many other  chronic and acute conditions are known to elevate hsTnI results.  Refer to the "Links" section for chest pain algorithms and additional  guidance. Performed at [facility], [address] [place], [place] [address]    pH, Ven 10/22/2022 7.34  7.25 - 7.43 Final   pCO2, Ven 10/22/2022 57  44 - 60 mmHg Final   pO2, Ven 10/22/2022 37  32 - 45 mmHg Final   Bicarbonate 10/22/2022 30.8 (H)  20.0 - 28.0 mmol/L Final   Acid-Base Excess 10/22/2022 3.5 (H)  0.0 - 2.0 mmol/L Final   O2 Saturation 10/22/2022 54.4  % Final   Patient temperature 10/22/2022 37.0   Final   Collection site 10/22/2022 VEIN   Final   Performed at [facility], [address] [place], [place] [address]   Glucose-Capillary 10/22/2022 198 (H)  70 - 99 mg/dL Final   Glucose reference range applies only to samples taken after fasting for at least 8 hours.   O2 Content 10/22/2022 4.0  L/min Final   Delivery systems 10/22/2022 NASAL CANNULA   Final   pH, Arterial 10/22/2022 7.41  7.35 - 7.45 Final   pCO2 arterial 10/22/2022 40  32 - 48 mmHg Final   pO2, Arterial 10/22/2022 124 (H)  83 - 108 mmHg Final   Bicarbonate 10/22/2022 25.4  20.0 - 28.0 mmol/L Final   Acid-Base Excess 10/22/2022 0.7  0.0 - 2.0 mmol/L Final   O2 Saturation  10/22/2022 99.3  % Final   Patient temperature 10/22/2022 37.0   Final   Collection site 10/22/2022 RIGHT RADIAL   Final   Allens test (pass/fail) 10/22/2022 PASS  PASS Final   Performed at [facility], [address] [place], [place] [address]  Admission on 09/24/2022, Discharged on 09/24/2022  Component Date Value Ref Range Status   Troponin I (High Sensitivity) 09/24/2022 5  <18 ng/L Final   Comment: (NOTE)  Elevated high sensitivity troponin I (hsTnI) values and significant  changes across serial measurements may suggest ACS but many other  chronic and acute conditions are known to elevate hsTnI results.  Refer to the "Links" section for chest pain algorithms and additional  guidance. Performed at [facility], [address], [place], [place] [address]    SARS Coronavirus 2 by RT PCR 09/24/2022 NEGATIVE  NEGATIVE Final   Influenza A by PCR 09/24/2022 NEGATIVE  NEGATIVE Final   Influenza B by PCR 09/24/2022 NEGATIVE  NEGATIVE Final   Comment: (NOTE) The Xpert Xpress SARS-CoV-2/FLU/RSV plus assay is intended as an aid in the diagnosis of influenza from Nasopharyngeal swab specimens and should not be used as a sole basis for treatment. Nasal washings and aspirates are unacceptable for Xpert Xpress SARS-CoV-2/FLU/RSV testing.  Fact Sheet for Patients: BloggerCourse.com  Fact Sheet for Healthcare Providers: SeriousBroker.it  This test is not yet approved or cleared by the [place] FDA and has been authorized for detection and/or diagnosis of SARS-CoV-2 by FDA under an Emergency Use Authorization (EUA). This EUA will remain in effect (meaning this test can be used) for the duration of the COVID-19 declaration under Section 564(b)(1) of the Act, 21 U.S.C. section 360bbb-3(b)(1), unless the authorization is terminated or revoked.     Resp Syncytial Virus by PCR 09/24/2022 NEGATIVE  NEGATIVE Final   Comment:  (NOTE) Fact Sheet for Patients: BloggerCourse.com  Fact Sheet for Healthcare Providers: SeriousBroker.it  This test is not yet approved or cleared by the [place] FDA and has been authorized for detection and/or diagnosis of SARS-CoV-2 by FDA under an Emergency Use Authorization (EUA). This EUA will remain in effect (meaning this test can be used) for the duration of the COVID-19 declaration under Section 564(b)(1) of the Act, 21 U.S.C. section 360bbb-3(b)(1), unless the authorization is terminated or revoked.  Performed at [facility], [address] [place], [place] [address]    Sodium 09/24/2022 139  135 - 145 mmol/L Final   Potassium 09/24/2022 4.0  3.5 - 5.1 mmol/L Final   HEMOLYSIS AT THIS LEVEL MAY AFFECT RESULT   Chloride 09/24/2022 101  98 - 111 mmol/L Final   CO2 09/24/2022 27  22 - 32 mmol/L Final   Glucose, Bld 09/24/2022 121 (H)  70 - 99 mg/dL Final   Glucose reference range applies only to samples taken after fasting for at least 8 hours.   BUN 09/24/2022 10  6 - 20 mg/dL Final   Creatinine, Ser 09/24/2022 0.98  0.61 - 1.24 mg/dL Final   Calcium 09/81/1914 9.1  8.9 - 10.3 mg/dL Final   GFR, Estimated 09/24/2022 >60  >60 mL/min Final   Comment: (NOTE) Calculated using the CKD-EPI Creatinine Equation (2021)    Anion gap 09/24/2022 11  5 - 15 Final   Performed at [facility], [address], [place], [place] [address]   WBC 09/24/2022 8.0  4.0 - 10.5 K/uL Final   RBC 09/24/2022 4.78  4.22 - 5.81 MIL/uL Final   Hemoglobin 09/24/2022 14.3  13.0 - 17.0 g/dL Final   HCT 62/13/0865 42.7  39.0 - 52.0 % Final   MCV 09/24/2022 89.3  80.0 - 100.0 fL Final   MCH 09/24/2022 29.9  26.0 - 34.0 pg Final   MCHC 09/24/2022 33.5  30.0 - 36.0 g/dL Final   RDW 78/46/9629 14.0  11.5 - 15.5 % Final   Platelets 09/24/2022 333  150 - 400 K/uL Final  nRBC 09/24/2022 0.0  0.0 - 0.2 % Final   Neutrophils Relative %  09/24/2022 61  % Final   Neutro Abs 09/24/2022 4.8  1.7 - 7.7 K/uL Final   Lymphocytes Relative 09/24/2022 27  % Final   Lymphs Abs 09/24/2022 2.2  0.7 - 4.0 K/uL Final   Monocytes Relative 09/24/2022 8  % Final   Monocytes Absolute 09/24/2022 0.7  0.1 - 1.0 K/uL Final   Eosinophils Relative 09/24/2022 3  % Final   Eosinophils Absolute 09/24/2022 0.3  0.0 - 0.5 K/uL Final   Basophils Relative 09/24/2022 1  % Final   Basophils Absolute 09/24/2022 0.1  0.0 - 0.1 K/uL Final   Immature Granulocytes 09/24/2022 0  % Final   Abs Immature Granulocytes 09/24/2022 0.02  0.00 - 0.07 K/uL Final   Performed at [facility], [address], [place], [place] [address]  Office Visit on 07/12/2022  Component Date Value Ref Range Status   Hemoglobin A1C 07/12/2022 5.9 (A)  4.0 - 5.6 % Final  Admission on 06/30/2022, Discharged on 07/01/2022  Component Date Value Ref Range Status   WBC 06/30/2022 8.9  4.0 - 10.5 K/uL Final   RBC 06/30/2022 4.26  4.22 - 5.81 MIL/uL Final   Hemoglobin 06/30/2022 12.7 (L)  13.0 - 17.0 g/dL Final   HCT 62/13/0865 39.6  39.0 - 52.0 % Final   MCV 06/30/2022 93.0  80.0 - 100.0 fL Final   MCH 06/30/2022 29.8  26.0 - 34.0 pg Final   MCHC 06/30/2022 32.1  30.0 - 36.0 g/dL Final   RDW 78/46/9629 14.8  11.5 - 15.5 % Final   Platelets 06/30/2022 234  150 - 400 K/uL Final   nRBC 06/30/2022 0.0  0.0 - 0.2 % Final   Performed at [facility], [address], [place], [place] [address]   Sodium 06/30/2022 140  135 - 145 mmol/L Final   Potassium 06/30/2022 3.8  3.5 - 5.1 mmol/L Final   Chloride 06/30/2022 107  98 - 111 mmol/L Final   CO2 06/30/2022 27  22 - 32 mmol/L Final   Glucose, Bld 06/30/2022 108 (H)  70 - 99 mg/dL Final   Glucose reference range applies only to samples taken after fasting for at least 8 hours.   BUN 06/30/2022 28 (H)  6 - 20 mg/dL Final   Creatinine, Ser 06/30/2022 1.18  0.61 - 1.24 mg/dL Final   Calcium 32/44/0102 9.0  8.9 - 10.3  mg/dL Final   GFR, Estimated 06/30/2022 >60  >60 mL/min Final   Comment: (NOTE) Calculated using the CKD-EPI Creatinine Equation (2021)    Anion gap 06/30/2022 6  5 - 15 Final   Performed at [facility], [address], [place], [place] [address]   Sodium 06/30/2022 140  135 - 145 mmol/L Final   Potassium 06/30/2022 3.7  3.5 - 5.1 mmol/L Final   Chloride 06/30/2022 105  98 - 111 mmol/L Final   BUN 06/30/2022 28 (H)  6 - 20 mg/dL Final   Creatinine, Ser 06/30/2022 1.10  0.61 - 1.24 mg/dL Final   Glucose, Bld 64/40/3474 147 (H)  70 - 99 mg/dL Final   Glucose reference range applies only to samples taken after fasting for at least 8 hours.   Calcium, Ion 06/30/2022 1.24  1.15 - 1.40 mmol/L Final   TCO2 06/30/2022 28  22 - 32 mmol/L Final   Hemoglobin 06/30/2022 12.6 (L)  13.0 - 17.0 g/dL Final  HCT 06/30/2022 37.0 (L)  39.0 - 52.0 % Final   B Natriuretic Peptide 06/30/2022 47.7  0.0 - 100.0 pg/mL Final   Performed at [facility], [address], [place], [place] [address]   Troponin I (High Sensitivity) 07/01/2022 4  <18 ng/L Final   Comment: (NOTE) Elevated high sensitivity troponin I (hsTnI) values and significant  changes across serial measurements may suggest ACS but many other  chronic and acute conditions are known to elevate hsTnI results.  Refer to the "Links" section for chest pain algorithms and additional  guidance. Performed at [facility], [address] [place], [place] [address]   There may be more visits with results that are not included.    Blood Alcohol level:  Lab Results  Component Value Date   ETH <10 12/02/2022   ETH <10 11/17/2022    Metabolic Disorder Labs: Lab Results  Component Value Date   HGBA1C 5.9 (A) 07/12/2022   MPG 117 02/02/2009   No results found for: "PROLACTIN" Lab Results  Component Value Date   CHOL 203 (H) 10/31/2022   TRIG 196 (H) 10/31/2022   HDL 84 10/31/2022    CHOLHDL 2.4 10/31/2022   VLDL 39 10/31/2022   LDLCALC 80 10/31/2022   LDLCALC 63 10/08/2014    Therapeutic Lab Levels: No results found for: "LITHIUM" No results found for: "VALPROATE" No results found for: "CBMZ"  Physical Findings   AIMS    Flowsheet Row Admission (Discharged) from 02/02/2016 [facility] Admission (Discharged) from 03/22/2015 in [facility] Admission (Discharged) from 10/05/2014 in [facility]PATIENT BE[facility]INE Admission (Discharged) [facility]  AIMS Total Score 0 0 0 0      AUDIT    Flowsheet Row Admission (Discharged) from 11/17/2022 in [facility] INPATIENT BEHAVIORAL MEDICINE Admission (Discharged) from 10/27/2022 in [facility] INPATIENT BEHAVIORAL MEDICINE Admission (Discharged) from 03/22/2015 in [facility] Admission (Discharged) from 10/05/2014 in [facility] INPATIENT BEHAVIORAL MEDICINE Admission (Discharged) from 10/01/2014 in [facility]  Alcohol Use Disorder Identification Test Final Score (AUDIT) 14 14 25 18  34      PHQ2-9    Flowsheet Row ED from 12/03/2022 in [facility]  PHQ-2 Total Score 6  PHQ-9 Total Score 27      Flowsheet Row ED from 12/03/2022 in [facility] ED from 12/02/2022 in [facility] Emergency Department at [facility] Admission (Discharged) from 11/17/2022 in [facility] INPATIENT BEHAVIORAL MEDICINE  C-SSRS RISK CATEGORY No Risk Error: Q3, 4, or 5 should not be populated when Q2 is No Moderate Risk        Musculoskeletal  Strength & Muscle Tone: within normal limits Gait & Station: normal Patient leans: N/A  Psychiatric Specialty Exam  Presentation  General Appearance:  Appropriate for Environment  Eye Contact: Fair  Speech: Clear and Coherent; Normal Rate  Speech Volume: Normal  Handedness: Right   Mood and Affect   Mood: Euthymic  Affect: Congruent; Appropriate   Thought Process  Thought Processes: Coherent; Goal Directed; Linear  Descriptions of Associations:Intact  Orientation:Full (Time, Place and Person)  Thought Content:Logical  Diagnosis of Schizophrenia or Schizoaffective disorder in past:    Hallucinations:Hallucinations: None  Ideas of Reference:None  Suicidal Thoughts:Suicidal Thoughts: No  Homicidal Thoughts:Homicidal Thoughts: No   Sensorium  Memory: Remote Good  Judgment: Fair  Insight: Fair   Executive Functions  Concentration: Good  Attention Span: Good  Recall: Dudley Major of Knowledge: Good  Language: Good   Psychomotor Activity  Psychomotor Activity:Psychomotor Activity: Normal AIMS Completed?: No   Assets  Assets: Communication Skills; Resilience; Desire for Improvement   Sleep  Sleep:Sleep: Fair   No data recorded   Physical Exam  Physical Exam Vitals and nursing note reviewed.  Neurological:     Mental Status: He is alert.  Psychiatric:        Mood and Affect: Mood normal.        Thought Content: Thought content normal.    Review of Systems  Psychiatric/Behavioral:  Positive for depression and substance abuse. The patient is nervous/anxious.    Blood pressure 123/72, pulse 78, temperature 98.2 F (36.8 C), temperature source Oral, resp. rate 16, SpO2 100%. There is no height or weight on file to calculate BMI.  Treatment Plan Summary: Daily contact with patient to assess and evaluate symptoms and progress in treatment and Medication management  Alcohol Abuse/Use Disorder Alcohol Withdrawal -Ativan taper completed 12/3 -Multivitamin with minerals daily -Tylenol 650 mg every 6 hours as needed for pain -Zofran 4 mg every 6 hours as needed for nausea or vomiting -Imodium 2 to 4 mg as needed for diarrhea or loose stools  -Maalox/Mylanta 30 mL every 4 hours as needed for indigestion -Milk of Mag 30 mL as needed  for constipation   Cocaine abuse/misuse -Encourage abstinence  Substance-induced mood d/o Restarted Prozac 20 mg daily Restarted Risperdal 1 mg nightly Continue trazodone 50 mg nightly as needed   CSW to start working on discharge disposition Patient encouraged to participate in therapeutic milieu  Dispo: [facility] tomorrow  Lance Muss, MD 12/08/2022 10:43 AM

## 2022-12-08 NOTE — ED Notes (Signed)
Patient is awake and alert on unit without issue or complaint.  Will monitor.

## 2022-12-09 DIAGNOSIS — F32A Depression, unspecified: Secondary | ICD-10-CM | POA: Diagnosis not present

## 2022-12-09 DIAGNOSIS — F1414 Cocaine abuse with cocaine-induced mood disorder: Secondary | ICD-10-CM | POA: Diagnosis not present

## 2022-12-09 NOTE — ED Provider Notes (Signed)
FBC/OBS ASAP Discharge Summary  Date and Time: 12/09/2022 8:05 AM  Name: Franklin [name]  MRN:  [id number]   Discharge Diagnoses:  Final diagnoses:  Cocaine abuse with cocaine-induced mood disorder [facility])    Subjective: Franklin [name] is a 52 year old male with a past psychiatric history significant for alcohol use disorder and cocaine abuse with cocaine induced mood disorder who presents to [facility] from [facility] with a chief complaint of cocaine abuse with cocaine induced mood disorder.   Patient doing well this morning, voiced no concerns and feels ready to go to [facility].  Stay Summary:  The patient was evaluated each day by a clinical provider to ascertain response to treatment. Improvement was noted by the patient's report of decreasing symptoms, improved sleep and appetite, affect, medication tolerance, behavior, and participation in unit programming.  Patient was asked each day to complete a self inventory noting mood, mental status, pain, new symptoms, anxiety and concerns.  The patient's medications were managed with the following directions:  Alcohol Abuse/Use Disorder Alcohol Withdrawal -Ativan taper completed 12/3 -Multivitamin with minerals daily -Tylenol 650 mg every 6 hours as needed for pain -Zofran 4 mg every 6 hours as needed for nausea or vomiting -Imodium 2 to 4 mg as needed for diarrhea or loose stools  -Maalox/Mylanta 30 mL every 4 hours as needed for indigestion -Milk of Mag 30 mL as needed for constipation    Cocaine abuse/misuse -Encourage abstinence   Substance-induced mood d/o Restarted Prozac 20 mg daily Restarted Risperdal 1 mg nightly Continue trazodone 50 mg nightly as needed  Patient responded well to medication and being in a therapeutic and supportive environment. Positive and appropriate behavior was noted and the patient was motivated for recovery. The patient worked closely with the  treatment team and case manager to develop a discharge plan with appropriate goals. Coping skills, problem solving as well as relaxation therapies were also part of the unit programming.   Total Time spent with patient: 30 minutes  Past Psychiatric History: Alcohol use disorder Cocaine use disorder Substance abuse disorder Major depressive disorder, recurrent severe, without psychotic features Generalized anxiety disorder Past Medical History: Chronic obstructive pulmonary disease Premature ventricular contraction Solitary pulmonary nodule Prediabetes Family History: Patient has a family history of hypertension and diabetes Family Psychiatric  History:  Social History:  No pertinent social history  Tobacco Cessation:  N/A, patient does not currently use tobacco products  Current Medications:  Current Facility-Administered Medications  Medication Dose Route Frequency Provider Last Rate Last Admin   acetaminophen (TYLENOL) tablet 650 mg  650 mg Oral Q6H PRN Nwoko, Uchenna E, PA   650 mg at 12/03/22 2002   alum & mag hydroxide-simeth (MAALOX/MYLANTA) 200-200-20 MG/5ML suspension 30 mL  30 mL Oral Q4H PRN Nwoko, Uchenna E, PA       diphenhydrAMINE (BENADRYL) capsule 50 mg  50 mg Oral Q6H PRN Nwoko, Uchenna E, PA       Or   diphenhydrAMINE (BENADRYL) injection 50 mg  50 mg Intramuscular Q6H PRN Nwoko, Uchenna E, PA       FLUoxetine (PROZAC) capsule 20 mg  20 mg Oral Daily Oneta Rack, NP   20 mg at 12/09/22 0800   haloperidol (HALDOL) tablet 5 mg  5 mg Oral Q6H PRN Nwoko, Uchenna E, PA       Or   haloperidol lactate (HALDOL) injection 5 mg  5 mg Intramuscular Q6H PRN Nwoko, Tommas Olp, PA  hydrOXYzine (ATARAX) tablet 25 mg  25 mg Oral TID PRN Nwoko, Uchenna E, PA       LORazepam (ATIVAN) tablet 2 mg  2 mg Oral Q6H PRN Nwoko, Uchenna E, PA       Or   LORazepam (ATIVAN) injection 2 mg  2 mg Intramuscular Q6H PRN Nwoko, Uchenna E, PA       magnesium hydroxide (MILK OF MAGNESIA)  suspension 30 mL  30 mL Oral Daily PRN Nwoko, Uchenna E, PA       multivitamin with minerals tablet 1 tablet  1 tablet Oral Daily Nwoko, Uchenna E, PA   1 tablet at 12/09/22 0800   risperiDONE (RISPERDAL) tablet 1 mg  1 mg Oral QHS Oneta Rack, NP   1 mg at 12/08/22 2106   thiamine (VITAMIN B1) tablet 100 mg  100 mg Oral Daily Nwoko, Uchenna E, PA   100 mg at 12/09/22 0800   traZODone (DESYREL) tablet 50 mg  50 mg Oral QHS PRN Nwoko, Uchenna E, PA   50 mg at 12/08/22 2106   Current Outpatient Medications  Medication Sig Dispense Refill   FLUoxetine (PROZAC) 20 MG capsule Take 1 capsule (20 mg total) by mouth daily. 30 capsule 0   risperiDONE (RISPERDAL) 1 MG tablet Take 1 tablet (1 mg total) by mouth at bedtime. 30 tablet 0   traZODone (DESYREL) 50 MG tablet Take 1 tablet (50 mg total) by mouth at bedtime as needed for sleep. 30 tablet 0    PTA Medications:  Facility Ordered Medications  Medication   acetaminophen (TYLENOL) tablet 650 mg   alum & mag hydroxide-simeth (MAALOX/MYLANTA) 200-200-20 MG/5ML suspension 30 mL   magnesium hydroxide (MILK OF MAGNESIA) suspension 30 mL   hydrOXYzine (ATARAX) tablet 25 mg   traZODone (DESYREL) tablet 50 mg   [COMPLETED] thiamine (VITAMIN B1) injection 100 mg   thiamine (VITAMIN B1) tablet 100 mg   multivitamin with minerals tablet 1 tablet   [EXPIRED] hydrOXYzine (ATARAX) tablet 25 mg   [EXPIRED] loperamide (IMODIUM) capsule 2-4 mg   [EXPIRED] ondansetron (ZOFRAN-ODT) disintegrating tablet 4 mg   [EXPIRED] LORazepam (ATIVAN) tablet 1 mg   Followed by   [COMPLETED] LORazepam (ATIVAN) tablet 1 mg   Followed by   [COMPLETED] LORazepam (ATIVAN) tablet 1 mg   Followed by   [COMPLETED] LORazepam (ATIVAN) tablet 1 mg   haloperidol (HALDOL) tablet 5 mg   Or   haloperidol lactate (HALDOL) injection 5 mg   LORazepam (ATIVAN) tablet 2 mg   Or   LORazepam (ATIVAN) injection 2 mg   diphenhydrAMINE (BENADRYL) capsule 50 mg   Or   diphenhydrAMINE  (BENADRYL) injection 50 mg   FLUoxetine (PROZAC) capsule 20 mg   risperiDONE (RISPERDAL) tablet 1 mg   PTA Medications  Medication Sig   FLUoxetine (PROZAC) 20 MG capsule Take 1 capsule (20 mg total) by mouth daily.   risperiDONE (RISPERDAL) 1 MG tablet Take 1 tablet (1 mg total) by mouth at bedtime.   traZODone (DESYREL) 50 MG tablet Take 1 tablet (50 mg total) by mouth at bedtime as needed for sleep.       12/09/2022    8:03 AM 12/06/2022    8:56 AM 12/05/2022    5:22 PM  Depression screen PHQ 2/9  Decreased Interest 0 3 3  Down, Depressed, Hopeless 0 3 3  PHQ - 2 Score 0 6 6  Altered sleeping 0  3  Tired, decreased energy 0  3  Change in appetite  0  3  Feeling bad or failure about yourself  0  3  Trouble concentrating 0  3  Moving slowly or fidgety/restless   3  Suicidal thoughts 0  3  PHQ-9 Score 0  27  Difficult doing work/chores   Extremely dIfficult    Flowsheet Row ED from 12/03/2022 in [facility] ED from 12/02/2022 in [facility] Emergency Department at [facility] Admission (Discharged) from 11/17/2022 in [facility] INPATIENT BEHAVIORAL MEDICINE  C-SSRS RISK CATEGORY No Risk Error: Q3, 4, or 5 should not be populated when Q2 is No Moderate Risk       Musculoskeletal  Strength & Muscle Tone: within normal limits Gait & Station: normal Patient leans: N/A  Psychiatric Specialty Exam  Presentation  General Appearance:  Appropriate for Environment  Eye Contact: Fair  Speech: Clear and Coherent; Normal Rate  Speech Volume: Normal  Handedness: Right   Mood and Affect  Mood: Euthymic  Affect: Congruent; Appropriate   Thought Process  Thought Processes: Coherent; Goal Directed; Linear  Descriptions of Associations:Intact  Orientation:Full (Time, Place and Person)  Thought Content:Logical  Diagnosis of Schizophrenia or Schizoaffective disorder in past: No  Duration of Psychotic Symptoms: Greater than six  months   Hallucinations:Hallucinations: None  Ideas of Reference:None  Suicidal Thoughts:Suicidal Thoughts: No  Homicidal Thoughts:Homicidal Thoughts: No   Sensorium  Memory: Remote Good  Judgment: Fair  Insight: Fair   Art therapist  Concentration: Good  Attention Span: Good  Recall: Good  Fund of Knowledge: Good  Language: Good   Psychomotor Activity  Psychomotor Activity: Psychomotor Activity: Normal AIMS Completed?: No   Assets  Assets: Communication Skills; Resilience; Desire for Improvement   Sleep  Sleep: Sleep: Fair   No data recorded  Physical Exam  Physical Exam Vitals reviewed.  Constitutional:      Appearance: Normal appearance.  HENT:     Head: Normocephalic and atraumatic.  Cardiovascular:     Rate and Rhythm: Normal rate.  Pulmonary:     Effort: Pulmonary effort is normal.  Neurological:     General: No focal deficit present.     Mental Status: He is alert and oriented to person, place, and time.    Review of Systems  Constitutional:  Negative for chills and fever.  Respiratory: Negative.    Cardiovascular: Negative.   Gastrointestinal: Negative.    Blood pressure 106/80, pulse 67, temperature 98.1 F (36.7 C), temperature source Oral, resp. rate 18, SpO2 100%. There is no height or weight on file to calculate BMI.  Demographic Factors:  Male and Low socioeconomic status  Loss Factors: Financial problems/change in socioeconomic status  Historical Factors: Impulsivity  Risk Reduction Factors:   Positive coping skills or problem solving skills  Continued Clinical Symptoms:  Alcohol/Substance Abuse/Dependencies  Cognitive Features That Contribute To Risk:  Closed-mindedness    Suicide Risk:  Mild:  Suicidal ideation of limited frequency, intensity, duration, and specificity.  There are no identifiable plans, no associated intent, mild dysphoria and related symptoms, good self-control (both objective  and subjective assessment), few other risk factors, and identifiable protective factors, including available and accessible social support.  Plan Of Care/Follow-up recommendations:  Activity as tolerated Regular diet Continue prescription medications See PCP for medical conditions   Disposition: Ronnie Doss, MD 12/09/2022, 8:05 AM

## 2022-12-09 NOTE — ED Notes (Signed)
Patient is sleeping. Respirations equal and unlabored, skin warm and dry. No change in assessment or acuity. Routine safety checks conducted according to facility protocol. Will continue to monitor for safety.   

## 2022-12-09 NOTE — ED Notes (Signed)
Patient A&Ox4. Denies intent to harm self/others when asked. Denies A/VH. Patient denies any physical complaints when asked. No acute distress noted. Support and encouragement provided. Routine safety checks conducted according to facility protocol. Encouraged patient to notify staff if thoughts of harm toward self or others arise. Patient verbalize understanding and agreement. Will continue to monitor for safety.    

## 2022-12-09 NOTE — ED Notes (Signed)
Patient A&O x 4, ambulatory. Patient discharged in no acute distress. Patient denied SI/HI, A/VH upon discharge. Patient verbalized understanding of all discharge instructions explained by staff, to include follow up appointments, RX's and safety plan. Patient reported mood 10/10.  Pt belongings returned to patient from locker # 13 intact. Patient escorted to lobby via staff for transport to destination. Safety maintained.  

## 2023-02-18 ENCOUNTER — Emergency Department (HOSPITAL_COMMUNITY): Payer: MEDICAID

## 2023-02-18 ENCOUNTER — Ambulatory Visit (HOSPITAL_COMMUNITY)
Admission: EM | Admit: 2023-02-18 | Discharge: 2023-02-18 | Disposition: A | Discharge status: Home / Self Care | Payer: MEDICAID | Source: Home / Self Care

## 2023-02-18 ENCOUNTER — Encounter (HOSPITAL_COMMUNITY): Payer: Self-pay

## 2023-02-18 ENCOUNTER — Emergency Department (HOSPITAL_COMMUNITY)
Admission: EM | Admit: 2023-02-18 | Discharge: 2023-02-18 | Disposition: A | Discharge status: Home / Self Care | Payer: MEDICAID

## 2023-02-18 ENCOUNTER — Emergency Department (HOSPITAL_COMMUNITY)
Admission: EM | Admit: 2023-02-18 | Discharge: 2023-02-19 | Disposition: A | Discharge status: Home / Self Care | Payer: MEDICAID | Attending: Emergency Medicine | Admitting: Emergency Medicine

## 2023-02-18 ENCOUNTER — Other Ambulatory Visit: Payer: Self-pay

## 2023-02-18 DIAGNOSIS — Y900 Blood alcohol level of less than 20 mg/100 ml: Secondary | ICD-10-CM | POA: Diagnosis not present

## 2023-02-18 DIAGNOSIS — F10239 Alcohol dependence with withdrawal, unspecified: Secondary | ICD-10-CM | POA: Diagnosis not present

## 2023-02-18 DIAGNOSIS — Z7951 Long term (current) use of inhaled steroids: Secondary | ICD-10-CM | POA: Diagnosis not present

## 2023-02-18 DIAGNOSIS — R059 Cough, unspecified: Secondary | ICD-10-CM | POA: Diagnosis present

## 2023-02-18 DIAGNOSIS — F1910 Other psychoactive substance abuse, uncomplicated: Secondary | ICD-10-CM | POA: Insufficient documentation

## 2023-02-18 DIAGNOSIS — Z79899 Other long term (current) drug therapy: Secondary | ICD-10-CM | POA: Diagnosis not present

## 2023-02-18 DIAGNOSIS — R45851 Suicidal ideations: Secondary | ICD-10-CM | POA: Insufficient documentation

## 2023-02-18 DIAGNOSIS — Z5900 Homelessness unspecified: Secondary | ICD-10-CM | POA: Insufficient documentation

## 2023-02-18 DIAGNOSIS — F1010 Alcohol abuse, uncomplicated: Secondary | ICD-10-CM | POA: Diagnosis present

## 2023-02-18 DIAGNOSIS — Z91148 Patient's other noncompliance with medication regimen for other reason: Secondary | ICD-10-CM | POA: Insufficient documentation

## 2023-02-18 DIAGNOSIS — Z20822 Contact with and (suspected) exposure to covid-19: Secondary | ICD-10-CM | POA: Diagnosis not present

## 2023-02-18 DIAGNOSIS — J441 Chronic obstructive pulmonary disease with (acute) exacerbation: Secondary | ICD-10-CM | POA: Insufficient documentation

## 2023-02-18 DIAGNOSIS — F10930 Alcohol use, unspecified with withdrawal, uncomplicated: Secondary | ICD-10-CM

## 2023-02-18 LAB — RESP PANEL BY RT-PCR (RSV, FLU A&B, COVID)  RVPGX2
Influenza A by PCR: NEGATIVE
Influenza B by PCR: NEGATIVE
Resp Syncytial Virus by PCR: NEGATIVE
SARS Coronavirus 2 by RT PCR: NEGATIVE

## 2023-02-18 LAB — CBC
HCT: 45.4 % (ref 39.0–52.0)
Hemoglobin: 14.8 g/dL (ref 13.0–17.0)
MCH: 29.1 pg (ref 26.0–34.0)
MCHC: 32.6 g/dL (ref 30.0–36.0)
MCV: 89.2 fL (ref 80.0–100.0)
Platelets: 399 10*3/uL (ref 150–400)
RBC: 5.09 MIL/uL (ref 4.22–5.81)
RDW: 13.6 % (ref 11.5–15.5)
WBC: 9.5 10*3/uL (ref 4.0–10.5)
nRBC: 0 % (ref 0.0–0.2)

## 2023-02-18 LAB — COMPREHENSIVE METABOLIC PANEL
ALT: 20 U/L (ref 0–44)
AST: 36 U/L (ref 15–41)
Albumin: 3.7 g/dL (ref 3.5–5.0)
Alkaline Phosphatase: 52 U/L (ref 38–126)
Anion gap: 15 (ref 5–15)
BUN: 11 mg/dL (ref 6–20)
CO2: 25 mmol/L (ref 22–32)
Calcium: 9.2 mg/dL (ref 8.9–10.3)
Chloride: 97 mmol/L — ABNORMAL LOW (ref 98–111)
Creatinine, Ser: 0.91 mg/dL (ref 0.61–1.24)
GFR, Estimated: >60 mL/min (ref >60)
Glucose, Bld: 125 mg/dL — ABNORMAL HIGH (ref 70–99)
Potassium: 4.1 mmol/L (ref 3.5–5.1)
Sodium: 137 mmol/L (ref 135–145)
Total Bilirubin: 0.7 mg/dL (ref 0.0–1.2)
Total Protein: 6.9 g/dL (ref 6.5–8.1)

## 2023-02-18 LAB — RAPID URINE DRUG SCREEN, HOSP PERFORMED
Amphetamines: NOT DETECTED
Barbiturates: NOT DETECTED
Benzodiazepines: NOT DETECTED
Cocaine: POSITIVE — AB
Opiates: NOT DETECTED
Tetrahydrocannabinol: NOT DETECTED

## 2023-02-18 LAB — ETHANOL: Alcohol, Ethyl (B): 16 mg/dL — ABNORMAL HIGH (ref <10)

## 2023-02-18 MED ORDER — PREDNISONE 50 MG PO TABS: ORAL_TABLET | ORAL | 0 refills | Status: DC

## 2023-02-18 MED ORDER — CHLORDIAZEPOXIDE HCL 25 MG PO CAPS
ORAL_CAPSULE | ORAL | 0 refills | Status: DC
  Filled 2023-02-18: qty 10, fill #0

## 2023-02-18 MED ORDER — AZITHROMYCIN 250 MG PO TABS
500.0000 mg | ORAL_TABLET | Freq: Once | ORAL | Status: AC
  Administered 2023-02-18: 500 mg via ORAL
  Filled 2023-02-18: qty 2

## 2023-02-18 MED ORDER — IPRATROPIUM-ALBUTEROL 0.5-2.5 (3) MG/3ML IN SOLN
3.0000 mL | Freq: Once | RESPIRATORY_TRACT | Status: AC
  Administered 2023-02-18: 3 mL via RESPIRATORY_TRACT
  Filled 2023-02-18: qty 3

## 2023-02-18 MED ORDER — ALBUTEROL SULFATE HFA 108 (90 BASE) MCG/ACT IN AERS: 2.0000 | INHALATION_SPRAY | RESPIRATORY_TRACT | 0 refills | Status: DC | PRN

## 2023-02-18 MED ORDER — PREDNISONE 20 MG PO TABS
60.0000 mg | ORAL_TABLET | Freq: Once | ORAL | Status: AC
  Administered 2023-02-18: 60 mg via ORAL
  Filled 2023-02-18: qty 3

## 2023-02-18 MED ORDER — CHLORDIAZEPOXIDE HCL 25 MG PO CAPS: ORAL_CAPSULE | ORAL | 0 refills | Status: DC

## 2023-02-18 MED ORDER — AZITHROMYCIN 250 MG PO TABS: 250.0000 mg | ORAL_TABLET | Freq: Every day | ORAL | 0 refills | Status: DC

## 2023-02-18 MED ORDER — CHLORDIAZEPOXIDE HCL 25 MG PO CAPS
25.0000 mg | ORAL_CAPSULE | Freq: Once | ORAL | Status: AC
  Administered 2023-02-18: 25 mg via ORAL
  Filled 2023-02-18: qty 1

## 2023-02-18 MED ORDER — PREDNISONE 50 MG PO TABS
ORAL_TABLET | ORAL | 0 refills | Status: DC
Start: 2023-02-18 — End: 2023-02-18
  Filled 2023-02-18: qty 4, fill #0

## 2023-02-18 MED ORDER — AZITHROMYCIN 250 MG PO TABS
250.0000 mg | ORAL_TABLET | Freq: Every day | ORAL | 0 refills | Status: DC
  Filled 2023-02-18: qty 6, 6d supply, fill #0

## 2023-02-18 MED ORDER — ALBUTEROL SULFATE HFA 108 (90 BASE) MCG/ACT IN AERS
2.0000 | INHALATION_SPRAY | RESPIRATORY_TRACT | 0 refills | Status: DC | PRN
  Filled 2023-02-18: qty 1, fill #0

## 2023-02-18 NOTE — ED Provider Notes (Signed)
[place] EMERGENCY DEPARTMENT AT [facility] Provider Note   CSN: [id number] Arrival date & time: 02/18/23  4098     History  Chief Complaint  Patient presents with   detox   Cough    Franklin [name] is a 53 y.o. male.  53 year old male with past medical history of polysubstance abuse and COPD presenting to the emergency department today with cough and shortness of breath.  Patient states he has had a productive cough now over the past few days.  He reports that he is coughing up a lot of dark looking sputum.  Denies any hemoptysis.  He denies any chest pain.  Denies any leg pain or swelling.  He states that he has been having some subjective fevers and chills at home.  He came to the ER today for further evaluation regarding this due to ongoing symptoms.  The patient states that he has not been feeling well has not been drinking much over the past few days.  Reports that he normally drinks a pint of liquor and a few beers per day.  He states that he is starting to feel shaky.  He denies any hallucinations.  Denies any suicidal or homicidal ideations.  He states that he does desire to stop drinking alcohol.  He does have outpatient options and would like to follow-up with them.   Cough Associated symptoms: shortness of breath        Home Medications Prior to Admission medications   Medication Sig Start Date End Date Taking? Authorizing Provider  albuterol (VENTOLIN HFA) 108 (90 Base) MCG/ACT inhaler Inhale 2 puffs into the lungs every 4 (four) hours as needed for wheezing or shortness of breath. 02/18/23  Yes Durwin Glaze, MD  amitriptyline (ELAVIL) 25 MG tablet Take 25 mg by mouth at bedtime. 02/03/23  Yes [provider]  ARIPiprazole (ABILIFY) 5 MG tablet Take 5 mg by mouth daily. 02/03/23  Yes [provider]  azithromycin (ZITHROMAX) 250 MG tablet Take 1 tablet (250 mg total) by mouth daily. Take 1 tablet by mouth daily, first dose given in the  emergency department 02/18/23  Yes Durwin Glaze, MD  chlordiazePOXIDE (LIBRIUM) 25 MG capsule 50mg  PO TID x 1D, then 25-50mg  PO BID X 1D, then 25-50mg  PO QD X 1D 02/18/23  Yes Durwin Glaze, MD  DULERA 200-5 MCG/ACT AERO Inhale 2 puffs into the lungs in the morning and at bedtime. 01/13/23  Yes [provider]  FLUoxetine (PROZAC) 20 MG capsule Take 1 capsule (20 mg total) by mouth daily. 12/08/22  Yes Lance Muss, MD  IBU 800 MG tablet Take 800 mg by mouth every 6 (six) hours as needed for moderate pain (pain score 4-6). 01/28/23  Yes [provider]  predniSONE (DELTASONE) 50 MG tablet Take 1 tab by mouth daily 02/18/23  Yes Durwin Glaze, MD  traZODone (DESYREL) 50 MG tablet Take 1 tablet (50 mg total) by mouth at bedtime as needed for sleep. 12/08/22  Yes Lance Muss, MD  VENTOLIN HFA 108 (90 Base) MCG/ACT inhaler Inhale 2 puffs into the lungs every 6 (six) hours as needed for wheezing or shortness of breath. 01/13/23  Yes [provider]  Vitamin D, Ergocalciferol, (DRISDOL) 1.25 MG (50000 UNIT) CAPS capsule Take 50,000 Units by mouth once a week. 02/09/23  Yes [provider]  risperiDONE (RISPERDAL) 1 MG tablet Take 1 tablet (1 mg total) by mouth at bedtime. 12/08/22 01/07/23  Lance Muss,  MD      Allergies    Patient has no known allergies.    Review of Systems   Review of Systems  Respiratory:  Positive for cough and shortness of breath.   All other systems reviewed and are negative.   Physical Exam Updated Vital Signs BP 132/81 (BP Location: Left Arm)   Pulse 73   Temp 97.7 F (36.5 C) (Axillary)   Resp 16   Ht 5\' 10"  (1.778 m)   Wt 74.8 kg   SpO2 100%   BMI 23.68 kg/m  Physical Exam Vitals and nursing note reviewed.   Gen: NAD Eyes: PERRL, EOMI HEENT: no oropharyngeal swelling Neck: trachea midline Resp: Diminished with scattered wheezes throughout all lung fields Card: RRR, no murmurs, rubs, or gallops Abd: nontender,  nondistended Extremities: no calf tenderness, no edema Vascular: 2+ radial pulses bilaterally, 2+ DP pulses bilaterally Neuro: Very mild tremor noted, clear nerves intact, no focal deficits Skin: no rashes Psyc: acting appropriately   ED Results / Procedures / Treatments   Labs (all labs ordered are listed, but only abnormal results are displayed) Labs Reviewed  COMPREHENSIVE METABOLIC PANEL - Abnormal; Notable for the following components:      Result Value   Chloride 97 (*)    Glucose, Bld 125 (*)    All other components within normal limits  ETHANOL - Abnormal; Notable for the following components:   Alcohol, Ethyl (B) 16 (*)    All other components within normal limits  RAPID URINE DRUG SCREEN, HOSP PERFORMED - Abnormal; Notable for the following components:   Cocaine POSITIVE (*)    All other components within normal limits  RESP PANEL BY RT-PCR (RSV, FLU A&B, COVID)  [id number]  CBC    EKG None  Radiology DG Chest Portable 1 View Result Date: 02/18/2023 CLINICAL DATA:  Follow-up nipple shadow EXAM: PORTABLE CHEST 1 VIEW COMPARISON:  Film from earlier in the same day. FINDINGS: Repeat frontal film with nipple markers shows the previously seen nodular density to represent the nipple. No focal infiltrate is seen. No bony abnormality is noted. IMPRESSION: Nipple shadows bilaterally similar to that seen on the recent exam. Electronically Signed   By: Alcide Clever M.D.   On: 02/18/2023 19:44   DG Chest 1 View Result Date: 02/18/2023 CLINICAL DATA:  Cough and fever EXAM: CHEST  1 VIEW COMPARISON:  10/26/2022 FINDINGS: 6 mm nodular density projecting in the left mid to lower lung, nipple shadow versus true pulmonary nodule. The lungs appear otherwise clear. Cardiac and mediastinal margins appear normal. No blunting of the costophrenic angles. No significant bony findings. IMPRESSION: 1. 6 mm nodular density projecting in the left mid to lower lung, nipple shadow versus true pulmonary  nodule. Nipple marker views or chest CT be utilized for definitive characterization. 2. No acute findings. Electronically Signed   By: Gaylyn Rong M.D.   On: 02/18/2023 17:15    Procedures Procedures    Medications Ordered in ED Medications  azithromycin (ZITHROMAX) tablet 500 mg (500 mg Oral Given 02/18/23 1703)  chlordiazePOXIDE (LIBRIUM) capsule 25 mg (25 mg Oral Given 02/18/23 1703)  ipratropium-albuterol (DUONEB) 0.5-2.5 (3) MG/3ML nebulizer solution 3 mL (3 mLs Nebulization Given 02/18/23 1706)  predniSONE (DELTASONE) tablet 60 mg (60 mg Oral Given 02/18/23 1703)    ED Course/ Medical Decision Making/ A&P  Medical Decision Making 53 year old male with past medical history of polysubstance abuse and COPD presenting to the emergency department today with cough and subjective fevers.  I will further evaluate the patient here with chest x-ray to eval for pneumonia, pulmonary edema, pulmonary infiltrates, pneumothorax.  Loss obtain a COVID and flu swab on the patient addition to basic labs.  The patient does have some very mild tremors but his vital signs are reassuring.  I will give patient Librium here.  Will give him prednisone as well as Zithromycin and a DuoNeb here for his cough.  Can be due to COPD exacerbation.  I will reevaluate for ultimate disposition.  The patient's chest x-ray showed a questionable pulmonary nodule.  On repeat this is unremarkable.  The patient remained hemodynamically stable here with no significant tremors on reassessment.  I think that he is stable for discharge.  The patient was requesting that we tried to get him into detox.  After speaking with our social workers here we do not arrange inpatient detox from the emergency department.  He is stable here and will be discharged with Librium taper and is encouraged to follow-up with behavioral health for reevaluation.  Amount and/or Complexity of Data Reviewed Labs:  ordered. Radiology: ordered.  Risk Prescription drug management.           Final Clinical Impression(s) / ED Diagnoses Final diagnoses:  Alcohol withdrawal syndrome without complication (HCC)  COPD exacerbation (HCC)    Rx / DC Orders ED Discharge Orders          Ordered    chlordiazePOXIDE (LIBRIUM) 25 MG capsule        02/18/23 2017    predniSONE (DELTASONE) 50 MG tablet        02/18/23 2017    albuterol (VENTOLIN HFA) 108 (90 Base) MCG/ACT inhaler  Every 4 hours PRN        02/18/23 2017    azithromycin (ZITHROMAX) 250 MG tablet  Daily        02/18/23 2017              Durwin Glaze, MD 02/18/23 2018

## 2023-02-18 NOTE — Progress Notes (Signed)
   02/18/23 2108  Patient Reported Information  How Did You Hear About [place]? Self  What Is the Reason for Your Visit/Call Today? Pt reports, he was recently discharged from [facility] for Alcohol withdrawals. Pt reports, currently having some withdrawal symptoms. Pt reports, using a gram of Crack Cocaine, drank a 12 pack of beer and a pint of liquor at 0600. Pt reports, drinking and using drugs daily. Pt denies, SI, HI, hallucinations, self-injurious behaviors and access to weapons. Pt reports, "I don't feel safe because drinking is like suicide," when asking the pt if he can contract for safety.  How Long Has This Been Causing You Problems? > than 6 months  What Do You Feel Would Help You the Most Today? Alcohol or Drug Use Treatment;Medication(s)  Have You Recently Had Any Thoughts About Hurting Yourself? No  Are You Planning to Commit Suicide/Harm Yourself At This time? No  Have you Recently Had Thoughts About Hurting Someone [name]? No  Are You Planning To Harm Someone At This Time? No  Explanation: None.  Physical Abuse Denies  Verbal Abuse Denies  Sexual Abuse Denies  Exploitation of patient/patient's resources Denies  Self-Neglect Denies  Possible abuse reported to: Other (Comment)  Have You Used Any Alcohol or Drugs in the Past 24 Hours? Yes  What Did You Use and How Much? Pt reports, he used a gram of Crack Cocaine, drank a 12 pack of beer and a pint of liquor at 0600. Pt reports, drinking and using drugs daily.  Do You Currently Have a Therapist/Psychiatrist? No  Have You Been Recently Discharged From Any Office Practice or Programs? No  [facility] Screening Triage Referral Assessment  Determination of Need Urgent (48 hours)  Options For Referral Facility-Based Crisis;Medication Management;BH Urgent Care;Inpatient Hospitalization;Outpatient Therapy    Determination of need: Urgent.   Flowsheet Row ED from 02/18/2023 in [facility] Most recent reading at 02/18/2023   9:55 PM ED from 02/18/2023 in [facility] Emergency Department at [facility] Most recent reading at 02/18/2023  8:38 AM ED from 12/03/2022 in [facility] Most recent reading at 12/04/2022  7:34 AM  C-SSRS RISK CATEGORY No Risk No Risk No Risk      Redmond Pulling, MS, [facility], [facility] Triage Specialist [phone]

## 2023-02-18 NOTE — ED Triage Notes (Signed)
Pt states he has had weakness, cough, fever and chills for a week. Pt states he would also like detox from alcohol.

## 2023-02-18 NOTE — ED Triage Notes (Signed)
Pt states that he just left [facility] and their were no beds, he went to the store and bought a beer, decided not to drink it and had thoughts of cutting himself with the bottle. Last drink yesterday,  normally drinks a 12 pack a day and a pint of liquor.

## 2023-02-18 NOTE — Progress Notes (Signed)
Substance abuse resources added to patients AVS.

## 2023-02-18 NOTE — ED Provider Notes (Signed)
 Behavioral Health Urgent Care Medical Screening Exam  Patient Name: Franklin [name] MRN: [id number] Date of Evaluation: 02/18/23 Chief Complaint:   Diagnosis:  Final diagnoses:  Polysubstance abuse (HCC)    History of Present illness: Franklin [name] is a 53 y.o. male with psychiatric history for suicidal ideation, depression, and polysubstance abuse (alcohol and cocaine).  Patient presented voluntarily to [facility] requesting substance abuse treatment.  Patient was evaluated face-to-face and his chart was reviewed by this nurse practitioner.  On assessment, patient is alert and oriented X4, he is calm and cooperative. He is speaking in a clear tone, moderate rate and volume. He has good eye contact. His moo in euthymic, affect is congruent. No objective signs of psychosis noted.   Patient reports that he was sober for about 1.5 months however he relapsed 2 weeks ago on alcohol and crack cocaine. He says he relapsed due to homelessness and hanging with the wrong people. He reports consuming approximately 1 pint of liquor and using 1gram of cocaine daily. He says he last consumed "1 pint liquor and a few cans of beer" and cocaine today around 6am. He report withdrawal symptoms of "feeling shaky." He denies all other symptoms. He denies a history of alcohol withdrawal seizure or DTs.  Per chart, patient was evaluated a few hours ago at [facility] with similar complaint. He was given Librium 25mg  P.O and prescription for Librium Taper to address alcohol withdrawal symptoms was sent to his pharmacy ([facility] on [address]). Patient was also provided with outpatient resources for substance abuse. Patient initially denied suicidal ideation but later endorsed passive suicidal ideation when provider attempted to discuss outpatient resources/services. Patient reports that he is only interested in inpatient services. He denies homicidal ideation, hallucination, paranoia, and all other substance abuse.   The patient  recently participated in inpatient substance abuse treatment. He also reports that he participated in residential rehab through [facility] in December 2024. This provider discussed outpatient substance abuse treatment options with patient as [facility] is currently at capacity for admission and patient denies current or past history of significant withdraw symptoms. Upon informing patient that [facility] is at capacity, patient began to endorse passive suicidal ideation stating "If I'm suicidal, you have to admit me. So I guess I will commit suicide by drinking."   Patient's suicidality is conditioned on obtaining inpatient services for the purpose of housing/shelter and the patient appears to have secondary gain due to homelessness.   Patient does not meet criteria for psychiatric admission or IVC. Supportive therapy provided about ongoing stressors. Discussed crisis plan, callling [phone] or going to Emergency Dept. Outpatient substance abuse resources provided to patient.      Flowsheet Row ED from 02/18/2023 in [facility] Most recent reading at 02/18/2023  9:55 PM ED from 02/18/2023 in [facility] Emergency Department at [facility] Most recent reading at 02/18/2023  8:38 AM ED from 12/03/2022 in [facility] Most recent reading at 12/04/2022  7:34 AM  C-SSRS RISK CATEGORY No Risk No Risk No Risk       Psychiatric Specialty Exam  Presentation  General Appearance:Appropriate for Environment  Eye Contact:Good  Speech:Clear and Coherent  Speech Volume:Normal  Handedness:Right   Mood and Affect  Mood: Euthymic  Affect: Congruent   Thought Process  Thought Processes: Coherent  Descriptions of Associations:Intact  Orientation:Full (Time, Place and Person)  Thought Content:WDL  Diagnosis of Schizophrenia or Schizoaffective disorder in past: No  Duration of Psychotic Symptoms: Greater  than six months   Hallucinations:None Voices telling him to kill himself  Ideas of Reference:None  Suicidal Thoughts:No -- (denies) -- (denies)  Homicidal Thoughts:No   Sensorium  Memory: Immediate Good; Recent Good; Remote Good  Judgment: Fair  Insight: Good   Executive Functions  Concentration: Good  Attention Span: Good  Recall: Good  Fund of Knowledge: Good  Language: Good   Psychomotor Activity  Psychomotor Activity: Normal Other (comment) (Not noted) No   Assets  Assets: Desire for Improvement; Physical Health; Social Support   Sleep  Sleep: Good  Number of hours:  8   Physical Exam: Physical Exam Vitals and nursing note reviewed.  Constitutional:      General: He is not in acute distress.    Appearance: He is well-developed.  HENT:     Head: Normocephalic and atraumatic.  Eyes:     Conjunctiva/sclera: Conjunctivae normal.  Cardiovascular:     Rate and Rhythm: Normal rate.  Pulmonary:     Effort: Pulmonary effort is normal.     Breath sounds: Normal breath sounds.  Musculoskeletal:        General: Normal range of motion.     Cervical back: Normal range of motion and neck supple.  Skin:    General: Skin is warm and dry.     Capillary Refill: Capillary refill takes less than 2 seconds.  Neurological:     Mental Status: He is alert and oriented to person, place, and time.  Psychiatric:        Attention and Perception: Attention and perception normal.        Mood and Affect: Mood normal.        Speech: Speech normal.        Behavior: Behavior normal. Behavior is cooperative.        Thought Content: Thought content normal.        Cognition and Memory: Cognition normal.    Review of Systems  Constitutional: Negative.   HENT: Negative.    Eyes: Negative.   Respiratory: Negative.    Cardiovascular: Negative.   Gastrointestinal: Negative.   Genitourinary: Negative.   Musculoskeletal: Negative.   Skin: Negative.   Neurological: Negative.    Endo/Heme/Allergies: Negative.   Psychiatric/Behavioral:  Positive for substance abuse.    There were no vitals taken for this visit. There is no height or weight on file to calculate BMI.  Musculoskeletal: Strength & Muscle Tone: within normal limits Gait & Station: normal Patient leans: Right   [facility] MSE Discharge Disposition for Follow up and Recommendations: Based on my evaluation the patient does not appear to have an emergency medical condition and can be discharged with resources and follow up care in outpatient services for substance abuse.    Maricela Bo, NP 02/18/2023, 10:47 PM

## 2023-02-18 NOTE — Discharge Instructions (Signed)
Please take the medication as prescribed for your withdrawal symptoms.  Do not drive or drink alcohol while taking this as it may make you drowsy.  Please follow-up with detox tomorrow.  We are unable to get this set up tonight.  You are medically cleared for detox.  Please take the prednisone and azithromycin and use 2 puffs of the albuterol for a flareup of your COPD.  Return to the ER for worsening symptoms.

## 2023-02-18 NOTE — Discharge Instructions (Signed)

## 2023-02-18 NOTE — ED Notes (Signed)
Patient A&O x 4, ambulatory. Patient discharged in no acute distress. Patient denied SI/HI, A/VH upon discharge. Patient verbalized understanding of all discharge instructions reviewed on AVS via staff, to include follow up appointments,  Safety maintained.

## 2023-02-19 ENCOUNTER — Other Ambulatory Visit: Payer: Self-pay

## 2023-02-19 LAB — RAPID URINE DRUG SCREEN, HOSP PERFORMED
Amphetamines: NOT DETECTED
Barbiturates: NOT DETECTED
Benzodiazepines: NOT DETECTED
Cocaine: POSITIVE — AB
Opiates: NOT DETECTED
Tetrahydrocannabinol: NOT DETECTED

## 2023-02-19 LAB — COMPREHENSIVE METABOLIC PANEL
ALT: 19 U/L (ref 0–44)
AST: 30 U/L (ref 15–41)
Albumin: 3.4 g/dL — ABNORMAL LOW (ref 3.5–5.0)
Alkaline Phosphatase: 60 U/L (ref 38–126)
Anion gap: 10 (ref 5–15)
BUN: 15 mg/dL (ref 6–20)
CO2: 24 mmol/L (ref 22–32)
Calcium: 9.5 mg/dL (ref 8.9–10.3)
Chloride: 100 mmol/L (ref 98–111)
Creatinine, Ser: 0.95 mg/dL (ref 0.61–1.24)
GFR, Estimated: >60 mL/min (ref >60)
Glucose, Bld: 213 mg/dL — ABNORMAL HIGH (ref 70–99)
Potassium: 4.3 mmol/L (ref 3.5–5.1)
Sodium: 134 mmol/L — ABNORMAL LOW (ref 135–145)
Total Bilirubin: 0.4 mg/dL (ref 0.0–1.2)
Total Protein: 6.6 g/dL (ref 6.5–8.1)

## 2023-02-19 LAB — CBC
HCT: 41.8 % (ref 39.0–52.0)
Hemoglobin: 13.8 g/dL (ref 13.0–17.0)
MCH: 29.2 pg (ref 26.0–34.0)
MCHC: 33 g/dL (ref 30.0–36.0)
MCV: 88.4 fL (ref 80.0–100.0)
Platelets: 373 10*3/uL (ref 150–400)
RBC: 4.73 MIL/uL (ref 4.22–5.81)
RDW: 13.6 % (ref 11.5–15.5)
WBC: 4.5 10*3/uL (ref 4.0–10.5)
nRBC: 0 % (ref 0.0–0.2)

## 2023-02-19 LAB — ACETAMINOPHEN LEVEL: Acetaminophen (Tylenol), Serum: <10 ug/mL — ABNORMAL LOW (ref 10–30)

## 2023-02-19 LAB — SALICYLATE LEVEL: Salicylate Lvl: <7 mg/dL — ABNORMAL LOW (ref 7.0–30.0)

## 2023-02-19 LAB — ETHANOL: Alcohol, Ethyl (B): <10 mg/dL (ref <10)

## 2023-02-19 MED ORDER — RISPERIDONE 1 MG PO TABS
1.0000 mg | ORAL_TABLET | Freq: Every day | ORAL | 0 refills | Status: DC
Start: 2023-02-19 — End: 2023-03-15

## 2023-02-19 MED ORDER — FLUOXETINE HCL 20 MG PO CAPS: 20.0000 mg | ORAL_CAPSULE | Freq: Every day | ORAL | 0 refills | Status: DC

## 2023-02-19 MED ORDER — TRAZODONE HCL 50 MG PO TABS: 50.0000 mg | ORAL_TABLET | Freq: Every evening | ORAL | 0 refills | Status: DC | PRN

## 2023-02-19 NOTE — ED Provider Notes (Signed)
 [place] EMERGENCY DEPARTMENT AT [facility] Provider Note   CSN: [id number] Arrival date & time: 02/18/23  2346     History  Chief Complaint  Patient presents with   Suicidal    Franklin [name] is a 53 y.o. male.  53 year old male with a history of anxiety, depression, substance abuse presents to the emergency department reporting thoughts of self-injurious behavior.  Had thoughts of wanting to cut himself with a broken beer bottle.  Reports worsening depression associated with relapse on drugs and alcohol.  Was seen earlier in the emergency department for this complaint and subsequently at behavioral health urgent care tonight.  Psychiatrically cleared for discharge just before midnight.  He has been noncompliant with his psychiatric medications; states he has run out of these.  No AVH, HI.  Hx of homelessness.  The history is provided by the patient. No language interpreter was used.       Home Medications Prior to Admission medications   Medication Sig Start Date End Date Taking? Authorizing Provider  albuterol (VENTOLIN HFA) 108 (90 Base) MCG/ACT inhaler Inhale 2 puffs into the lungs every 4 (four) hours as needed for wheezing or shortness of breath. 02/18/23   Durwin Glaze, MD  amitriptyline (ELAVIL) 25 MG tablet Take 25 mg by mouth at bedtime. 02/03/23   [provider]  ARIPiprazole (ABILIFY) 5 MG tablet Take 5 mg by mouth daily. 02/03/23   [provider]  azithromycin (ZITHROMAX) 250 MG tablet Take 1 tablet (250 mg total) by mouth daily. Take 1 tablet by mouth daily, first dose given in the emergency department 02/18/23   Durwin Glaze, MD  chlordiazePOXIDE (LIBRIUM) 25 MG capsule 50mg  PO TID x 1D, then 25-50mg  PO BID X 1D, then 25-50mg  PO QD X 1D 02/18/23   Durwin Glaze, MD  DULERA 200-5 MCG/ACT AERO Inhale 2 puffs into the lungs in the morning and at bedtime. 01/13/23   [provider]  FLUoxetine (PROZAC) 20 MG capsule Take 1 capsule (20  mg total) by mouth daily. 02/19/23   Antony Madura, PA-C  IBU 800 MG tablet Take 800 mg by mouth every 6 (six) hours as needed for moderate pain (pain score 4-6). 01/28/23   [provider]  predniSONE (DELTASONE) 50 MG tablet Take 1 tab by mouth daily 02/18/23   Durwin Glaze, MD  risperiDONE (RISPERDAL) 1 MG tablet Take 1 tablet (1 mg total) by mouth at bedtime. 02/19/23 03/21/23  Antony Madura, PA-C  traZODone (DESYREL) 50 MG tablet Take 1 tablet (50 mg total) by mouth at bedtime as needed for sleep. 02/19/23   Antony Madura, PA-C  VENTOLIN HFA 108 (90 Base) MCG/ACT inhaler Inhale 2 puffs into the lungs every 6 (six) hours as needed for wheezing or shortness of breath. 01/13/23   [provider]  Vitamin D, Ergocalciferol, (DRISDOL) 1.25 MG (50000 UNIT) CAPS capsule Take 50,000 Units by mouth once a week. 02/09/23   [provider]      Allergies    Patient has no known allergies.    Review of Systems   Review of Systems Ten systems reviewed and are negative for acute change, except as noted in the HPI.    Physical Exam Updated Vital Signs BP (!) 148/92 (BP Location: Right Arm)   Pulse 92   Temp 98.2 F (36.8 C) (Oral)   Resp 16   SpO2 99%   Physical Exam Vitals and nursing note reviewed.  Constitutional:  General: He is not in acute distress.    Appearance: He is well-developed. He is not diaphoretic.     Comments: Nontoxic appearing and in NAD  HENT:     Head: Normocephalic and atraumatic.  Eyes:     General: No scleral icterus.    Conjunctiva/sclera: Conjunctivae normal.  Pulmonary:     Effort: Pulmonary effort is normal. No respiratory distress.     Comments: Respirations even and unlabored Musculoskeletal:        General: Normal range of motion.     Cervical back: Normal range of motion.  Skin:    General: Skin is warm and dry.     Coloration: Skin is not pale.     Findings: No erythema or rash.  Neurological:     Mental Status: He is alert  and oriented to person, place, and time.     Coordination: Coordination normal.     Comments: No tremors. Speech clear without slurring.  Psychiatric:        Behavior: Behavior normal.     ED Results / Procedures / Treatments   Labs (all labs ordered are listed, but only abnormal results are displayed) Labs Reviewed  COMPREHENSIVE METABOLIC PANEL - Abnormal; Notable for the following components:      Result Value   Sodium 134 (*)    Glucose, Bld 213 (*)    Albumin 3.4 (*)    All other components within normal limits  SALICYLATE LEVEL - Abnormal; Notable for the following components:   Salicylate Lvl <7.0 (*)    All other components within normal limits  ACETAMINOPHEN LEVEL - Abnormal; Notable for the following components:   Acetaminophen (Tylenol), Serum <10 (*)    All other components within normal limits  RAPID URINE DRUG SCREEN, HOSP PERFORMED - Abnormal; Notable for the following components:   Cocaine POSITIVE (*)    All other components within normal limits  ETHANOL  CBC    EKG None  Radiology DG Chest Portable 1 View Result Date: 02/18/2023 CLINICAL DATA:  Follow-up nipple shadow EXAM: PORTABLE CHEST 1 VIEW COMPARISON:  Film from earlier in the same day. FINDINGS: Repeat frontal film with nipple markers shows the previously seen nodular density to represent the nipple. No focal infiltrate is seen. No bony abnormality is noted. IMPRESSION: Nipple shadows bilaterally similar to that seen on the recent exam. Electronically Signed   By: Alcide Clever M.D.   On: 02/18/2023 19:44   DG Chest 1 View Result Date: 02/18/2023 CLINICAL DATA:  Cough and fever EXAM: CHEST  1 VIEW COMPARISON:  10/26/2022 FINDINGS: 6 mm nodular density projecting in the left mid to lower lung, nipple shadow versus true pulmonary nodule. The lungs appear otherwise clear. Cardiac and mediastinal margins appear normal. No blunting of the costophrenic angles. No significant bony findings. IMPRESSION: 1. 6 mm  nodular density projecting in the left mid to lower lung, nipple shadow versus true pulmonary nodule. Nipple marker views or chest CT be utilized for definitive characterization. 2. No acute findings. Electronically Signed   By: Gaylyn Rong M.D.   On: 02/18/2023 17:15    Procedures Procedures    Medications Ordered in ED Medications - No data to display  ED Course/ Medical Decision Making/ A&P                                 Medical Decision Making Amount and/or Complexity of Data Reviewed Labs:  ordered.  Risk Prescription drug management.   This patient presents to the ED for concern of polysubstance use, this involves an extensive number of treatment options, and is a complaint that carries with it a high risk of complications and morbidity.  The differential diagnosis includes acute intoxication vs withdrawal vs substance induced mood disorder vs malingering.   Co morbidities that complicate the patient evaluation  Depression Anxiety Substance abuse COPD   Additional history obtained:  External records from outside source obtained and reviewed including medical screening exam at [facility]; exam occurred a few hours prior to patient arrival.   Lab Tests:  I Ordered, and personally interpreted labs.  The pertinent results include:  UDS positive for cocaine   Cardiac Monitoring:  The patient was maintained on a cardiac monitor.  I personally viewed and interpreted the cardiac monitored which showed an underlying rhythm of: NSR   Medicines ordered and prescription drug management:  I have reviewed the patients home medicines and have made adjustments as needed   Problem List / ED Course:  53 year old male with longstanding history of polysubstance abuse and homelessness presents to the emergency department for psychiatric assessment.  He reports passive SI with thoughts of cutting himself with a broken beer bottle.  States that he was sober for 2 months and  recently relapsed.  He is seeking assistance with detox and rehab.   This is the patient's third visit in the last 24 hours for these complaints.  He was assessed at behavioral health earlier this evening and psychiatrically cleared.  Does have a history of chronic suicidal ideation per chart review.  Has been calm and cooperative since arrival; makes good eye contact.  Do not feel that there is an emergent need for inpatient hospitalization or psychiatric stabilization.   I have encouraged the patient to resume his prescribed medications; refills have been sent to his pharmacy.  Also given resource guide for local outpatient and residential treatment facilities.   Reevaluation:  After the interventions noted above, I reevaluated the patient and found that they have :stayed the same   Social Determinants of Health:  Housing instability Polysubstance abuse   Dispostion:  After consideration of the diagnostic results and the patients response to treatment, I feel that the patent would benefit from outpatient f/u with a detox/rehabilitation facility. Reports being out of his psychiatric medications; these have been refilled. Return precautions provided. Discharged in stable condition.          Final Clinical Impression(s) / ED Diagnoses Final diagnoses:  Polysubstance abuse (HCC)  Noncompliance with medication regimen    Rx / DC Orders ED Discharge Orders          Ordered    traZODone (DESYREL) 50 MG tablet  At bedtime PRN        02/19/23 0311    FLUoxetine (PROZAC) 20 MG capsule  Daily        02/19/23 0311    risperiDONE (RISPERDAL) 1 MG tablet  Daily at bedtime       Note to Pharmacy: change to regular tabs per Nanine Means EPIC secure chat 11/23/22 AMP   02/19/23 0311              Antony Madura, PA-C 02/19/23 0346    Nira Conn, MD 02/19/23 [phone]

## 2023-02-19 NOTE — Discharge Instructions (Addendum)
 We recommend resuming your prescribed psychiatric medications. Refills have been sent to your preferred pharmacy. Follow up with an outpatient or residential substance use facility. You may also present to [facility] for any acute psychiatric complaints.

## 2023-02-27 ENCOUNTER — Inpatient Hospital Stay
Admission: AD | Admit: 2023-02-27 | Discharge: 2023-03-15 | DRG: 885 | Disposition: A | Discharge status: Home / Self Care | Payer: MEDICAID | Source: Other Acute Inpatient Hospital | Attending: Psychiatry | Admitting: Psychiatry

## 2023-02-27 ENCOUNTER — Other Ambulatory Visit: Payer: Self-pay

## 2023-02-27 ENCOUNTER — Encounter: Payer: Self-pay | Admitting: Psychiatry

## 2023-02-27 ENCOUNTER — Emergency Department
Admission: EM | Admit: 2023-02-27 | Discharge: 2023-02-27 | Disposition: A | Discharge status: Other Acute Inpatient Hospital | Payer: MEDICAID | Attending: Emergency Medicine | Admitting: Emergency Medicine

## 2023-02-27 ENCOUNTER — Emergency Department: Payer: MEDICAID

## 2023-02-27 DIAGNOSIS — Z833 Family history of diabetes mellitus: Secondary | ICD-10-CM

## 2023-02-27 DIAGNOSIS — F1420 Cocaine dependence, uncomplicated: Secondary | ICD-10-CM | POA: Diagnosis present

## 2023-02-27 DIAGNOSIS — Z9151 Personal history of suicidal behavior: Secondary | ICD-10-CM

## 2023-02-27 DIAGNOSIS — R45851 Suicidal ideations: Secondary | ICD-10-CM

## 2023-02-27 DIAGNOSIS — F329 Major depressive disorder, single episode, unspecified: Secondary | ICD-10-CM | POA: Diagnosis present

## 2023-02-27 DIAGNOSIS — F410 Panic disorder [episodic paroxysmal anxiety] without agoraphobia: Secondary | ICD-10-CM | POA: Diagnosis present

## 2023-02-27 DIAGNOSIS — F332 Major depressive disorder, recurrent severe without psychotic features: Secondary | ICD-10-CM | POA: Diagnosis present

## 2023-02-27 DIAGNOSIS — F1994 Other psychoactive substance use, unspecified with psychoactive substance-induced mood disorder: Secondary | ICD-10-CM | POA: Diagnosis not present

## 2023-02-27 DIAGNOSIS — F1410 Cocaine abuse, uncomplicated: Secondary | ICD-10-CM | POA: Diagnosis present

## 2023-02-27 DIAGNOSIS — F1990 Other psychoactive substance use, unspecified, uncomplicated: Secondary | ICD-10-CM

## 2023-02-27 DIAGNOSIS — J441 Chronic obstructive pulmonary disease with (acute) exacerbation: Secondary | ICD-10-CM | POA: Diagnosis present

## 2023-02-27 DIAGNOSIS — F1414 Cocaine abuse with cocaine-induced mood disorder: Secondary | ICD-10-CM | POA: Diagnosis present

## 2023-02-27 DIAGNOSIS — J449 Chronic obstructive pulmonary disease, unspecified: Secondary | ICD-10-CM | POA: Diagnosis present

## 2023-02-27 DIAGNOSIS — Z5982 Transportation insecurity: Secondary | ICD-10-CM

## 2023-02-27 DIAGNOSIS — Z7952 Long term (current) use of systemic steroids: Secondary | ICD-10-CM | POA: Diagnosis not present

## 2023-02-27 DIAGNOSIS — Z5900 Homelessness unspecified: Secondary | ICD-10-CM | POA: Diagnosis not present

## 2023-02-27 DIAGNOSIS — Z79899 Other long term (current) drug therapy: Secondary | ICD-10-CM

## 2023-02-27 DIAGNOSIS — Z8249 Family history of ischemic heart disease and other diseases of the circulatory system: Secondary | ICD-10-CM

## 2023-02-27 DIAGNOSIS — Z5941 Food insecurity: Secondary | ICD-10-CM | POA: Diagnosis not present

## 2023-02-27 DIAGNOSIS — F17210 Nicotine dependence, cigarettes, uncomplicated: Secondary | ICD-10-CM | POA: Diagnosis present

## 2023-02-27 DIAGNOSIS — Z765 Malingerer [conscious simulation]: Secondary | ICD-10-CM

## 2023-02-27 DIAGNOSIS — F1910 Other psychoactive substance abuse, uncomplicated: Secondary | ICD-10-CM | POA: Diagnosis present

## 2023-02-27 DIAGNOSIS — F1010 Alcohol abuse, uncomplicated: Secondary | ICD-10-CM | POA: Diagnosis present

## 2023-02-27 LAB — COMPREHENSIVE METABOLIC PANEL
ALT: 20 U/L (ref 0–44)
AST: 27 U/L (ref 15–41)
Albumin: 4.1 g/dL (ref 3.5–5.0)
Alkaline Phosphatase: 59 U/L (ref 38–126)
Anion gap: 13 (ref 5–15)
BUN: 11 mg/dL (ref 6–20)
CO2: 25 mmol/L (ref 22–32)
Calcium: 9.5 mg/dL (ref 8.9–10.3)
Chloride: 99 mmol/L (ref 98–111)
Creatinine, Ser: 0.97 mg/dL (ref 0.61–1.24)
GFR, Estimated: >60 mL/min (ref >60)
Glucose, Bld: 106 mg/dL — ABNORMAL HIGH (ref 70–99)
Potassium: 3.6 mmol/L (ref 3.5–5.1)
Sodium: 137 mmol/L (ref 135–145)
Total Bilirubin: 1 mg/dL (ref 0.0–1.2)
Total Protein: 7.6 g/dL (ref 6.5–8.1)

## 2023-02-27 LAB — URINE DRUG SCREEN, QUALITATIVE (ARMC ONLY)
Amphetamines, Ur Screen: NOT DETECTED
Barbiturates, Ur Screen: NOT DETECTED
Benzodiazepine, Ur Scrn: POSITIVE — AB
Cannabinoid 50 Ng, Ur ~~LOC~~: NOT DETECTED
Cocaine Metabolite,Ur ~~LOC~~: POSITIVE — AB
MDMA (Ecstasy)Ur Screen: NOT DETECTED
Methadone Scn, Ur: NOT DETECTED
Opiate, Ur Screen: NOT DETECTED
Phencyclidine (PCP) Ur S: NOT DETECTED
Tricyclic, Ur Screen: NOT DETECTED

## 2023-02-27 LAB — URINALYSIS, COMPLETE (UACMP) WITH MICROSCOPIC
Bacteria, UA: NONE SEEN
Bilirubin Urine: NEGATIVE
Glucose, UA: 150 mg/dL — AB
Hgb urine dipstick: NEGATIVE
Ketones, ur: NEGATIVE mg/dL
Leukocytes,Ua: NEGATIVE
Nitrite: NEGATIVE
Protein, ur: NEGATIVE mg/dL
Specific Gravity, Urine: 1.021 (ref 1.005–1.030)
pH: 8 (ref 5.0–8.0)

## 2023-02-27 LAB — RESP PANEL BY RT-PCR (RSV, FLU A&B, COVID)  RVPGX2
Influenza A by PCR: NEGATIVE
Influenza B by PCR: NEGATIVE
Resp Syncytial Virus by PCR: NEGATIVE
SARS Coronavirus 2 by RT PCR: NEGATIVE

## 2023-02-27 LAB — CBC
HCT: 44.1 % (ref 39.0–52.0)
Hemoglobin: 14.4 g/dL (ref 13.0–17.0)
MCH: 29.3 pg (ref 26.0–34.0)
MCHC: 32.7 g/dL (ref 30.0–36.0)
MCV: 89.6 fL (ref 80.0–100.0)
Platelets: 413 10*3/uL — ABNORMAL HIGH (ref 150–400)
RBC: 4.92 MIL/uL (ref 4.22–5.81)
RDW: 14.2 % (ref 11.5–15.5)
WBC: 7 10*3/uL (ref 4.0–10.5)
nRBC: 0 % (ref 0.0–0.2)

## 2023-02-27 LAB — TROPONIN I (HIGH SENSITIVITY)
Troponin I (High Sensitivity): 5 ng/L (ref <18)
Troponin I (High Sensitivity): 5 ng/L (ref <18)

## 2023-02-27 LAB — SALICYLATE LEVEL: Salicylate Lvl: <7 mg/dL — ABNORMAL LOW (ref 7.0–30.0)

## 2023-02-27 LAB — ETHANOL: Alcohol, Ethyl (B): <10 mg/dL (ref <10)

## 2023-02-27 LAB — CHLAMYDIA/NGC RT PCR (ARMC ONLY)
Chlamydia Tr: NOT DETECTED
N gonorrhoeae: NOT DETECTED

## 2023-02-27 LAB — ACETAMINOPHEN LEVEL: Acetaminophen (Tylenol), Serum: <10 ug/mL — ABNORMAL LOW (ref 10–30)

## 2023-02-27 MED ORDER — PREDNISONE 20 MG PO TABS
60.0000 mg | ORAL_TABLET | Freq: Once | ORAL | Status: AC
Start: 2023-02-27 — End: 2023-02-27
  Administered 2023-02-27: 60 mg via ORAL
  Filled 2023-02-27: qty 3

## 2023-02-27 MED ORDER — ALBUTEROL SULFATE (2.5 MG/3ML) 0.083% IN NEBU
2.5000 mg | INHALATION_SOLUTION | RESPIRATORY_TRACT | Status: DC | PRN
  Administered 2023-03-12: 2.5 mg via RESPIRATORY_TRACT
  Filled 2023-02-27 (×3): qty 3

## 2023-02-27 MED ORDER — FLUOXETINE HCL 20 MG PO CAPS
20.0000 mg | ORAL_CAPSULE | Freq: Every day | ORAL | Status: DC
  Administered 2023-02-27: 20 mg via ORAL
  Filled 2023-02-27: qty 1

## 2023-02-27 MED ORDER — OLANZAPINE 10 MG IM SOLR: 5.0000 mg | Freq: Three times a day (TID) | INTRAMUSCULAR | Status: DC | PRN

## 2023-02-27 MED ORDER — ALBUTEROL SULFATE HFA 108 (90 BASE) MCG/ACT IN AERS
2.0000 | INHALATION_SPRAY | RESPIRATORY_TRACT | Status: DC | PRN
  Filled 2023-02-27: qty 6.7

## 2023-02-27 MED ORDER — OLANZAPINE 5 MG PO TBDP: 5.0000 mg | ORAL_TABLET | Freq: Three times a day (TID) | ORAL | Status: DC | PRN

## 2023-02-27 MED ORDER — ACETAMINOPHEN 325 MG PO TABS: 650.0000 mg | ORAL_TABLET | Freq: Four times a day (QID) | ORAL | Status: DC | PRN

## 2023-02-27 MED ORDER — MAGNESIUM HYDROXIDE 400 MG/5ML PO SUSP: 30.0000 mL | Freq: Every day | ORAL | Status: DC | PRN

## 2023-02-27 MED ORDER — ALUM & MAG HYDROXIDE-SIMETH 200-200-20 MG/5ML PO SUSP: 30.0000 mL | ORAL | Status: DC | PRN

## 2023-02-27 MED ORDER — FLUOXETINE HCL 20 MG PO CAPS
20.0000 mg | ORAL_CAPSULE | Freq: Every day | ORAL | Status: DC
  Administered 2023-02-28 – 2023-03-02 (×3): 20 mg via ORAL
  Filled 2023-02-27 (×3): qty 1

## 2023-02-27 NOTE — ED Notes (Signed)
 Pt requested an STD test. Provider notified of same. Pt was encouraged to give a urine sample for STD testing. Pt still has not provided a sample.

## 2023-02-27 NOTE — ED Triage Notes (Addendum)
 Pt to ed from  a gast station. Pt called for SOB all day. Pt has expiratory wheeze per EMS but received 1 due neb.  Vitals for EMS  138/100 108 98% RA    Pt is caox4, in no acute distress and ambulatory from EMS bay to triage.   Pt is homeless patient. Pt feels better after receiving his neb treatment.  Pt states during triage "I am also feeling suicidal".  Pt admitts to drinking ETOH today and doing crack cocaine.

## 2023-02-27 NOTE — ED Notes (Signed)
 Breakfast tray provided.

## 2023-02-27 NOTE — ED Notes (Signed)
 Pt ask for a dinner tray and ginger ale. Advised pt we can give him a dinner tray and water, provided dinner tray and water to pt.

## 2023-02-27 NOTE — ED Notes (Signed)
 Pt ambulated to restroom. A urine sample was requested and pt stated he would try.

## 2023-02-27 NOTE — Consult Note (Signed)
 [facility] Consult Follow-up  Patient Name: .Franklin [name]  MRN: [id number]  DOB: 1970-11-28  Consult Order details:  Orders (From admission, onward)     Start     Ordered   02/27/23 0326  IP CONSULT TO PSYCHIATRY       Ordering Provider: Irean Hong, MD  Provider:  (Not yet assigned)  Question Answer Comment  Place call to: psychiatry   Reason for Consult Consult   Diagnosis/Clinical Info for Consult: cocaine abuse, SI      02/27/23 0326   02/27/23 0159  CONSULT TO CALL ACT TEAM       Ordering Provider: Irean Hong, MD  Provider:  (Not yet assigned)  Question:  Reason for Consult?  Answer:  [facility]   02/27/23 0159             Mode of Visit: Tele-visit Virtual Statement:TELE PSYCHIATRY ATTESTATION & CONSENT As the provider for this telehealth consult, I attest that I verified the patient's identity using two separate identifiers, introduced myself to the patient, provided my credentials, disclosed my location, and performed this encounter via a HIPAA-compliant, real-time, face-to-face, two-way, interactive audio and video platform and with the full consent and agreement of the patient (or guardian as applicable.) Patient physical location: [facility] ED. Telehealth provider physical location: home office in state of [place].   Video start time: 10:38 AM Video end time: 11:10 AM    Psychiatry Consult Evaluation  Service Date: February 27, 2023 LOS:  LOS: 0 days  Chief Complaint "drug problems, suicidal problems"  Primary Psychiatric Diagnoses  Suicidal ideation 2.  MDD   Assessment  Franklin [name] is a 53 y.o. male presenting to [facility] emergency department on 02/27/2023  2:25 AM for suicidal ideations.  Patient reports going to take some pills or either cut my wrist.  Patient states I took some pills a few days ago but I guess it was not enough.  He has a psychiatric history of major depressive disorder, suicidal ideation, cocaine use disorder,  alcohol use disorder, tobacco abuse, and generalized anxiety disorder.  Patient has a psychiatric history of multiple psychiatric hospitalizations and rehab treatments.  Patient reports a lot of my son years ago and I have not been right since.  He reports being prescribed Prozac 20 mg daily but has been noncompliant.  Prozac restarted today as he reports a history of effectiveness.  Patient denies homicidal ideations, AVH, paranoia or delusional thoughts  Patient meets criteria for inpatient psychiatry to ensure safety due to him having suicidal ideations with plan and intent.  Please see plan below for detailed recommendations.   Diagnoses:  Active Hospital problems: Suicidal ideations MDD   Plan   ## Psychiatric Medication Recommendations:  Start Prozac 20 mg daily  ## Medical Decision Making Capacity: Not specifically addressed in this encounter  ## Further Work-up:  -- Deferred to ED P EKG -- most recent EKG on 02/27/23 had QtC of 462 -- Pertinent labwork reviewed earlier this admission includes: CBC, CMP, UDS pos for cocaine and benzodiazepine, ethyl alcohol, tylenol level   ## Disposition:-- We recommend inpatient psychiatric hospitalization when medically cleared. Patient is under voluntary admission status at this time; please IVC if attempts to leave hospital.  ## Behavioral / Environmental: - No specific recommendations at this time.     ## Safety and Observation Level:  - Based on my clinical evaluation, I estimate the patient to be at low risk of self harm in the  current setting. - At this time, we recommend  routine. This decision is based on my review of the chart including patient's history and current presentation, interview of the patient, mental status examination, and consideration of suicide risk including evaluating suicidal ideation, plan, intent, suicidal or self-harm behaviors, risk factors, and protective factors. This judgment is based on our ability to  directly address suicide risk, implement suicide prevention strategies, and develop a safety plan while the patient is in the clinical setting. Please contact our team if there is a concern that risk level has changed.  CSSR Risk Category:C-SSRS RISK CATEGORY: No Risk  Suicide Risk Assessment: Patient has following modifiable risk factors for suicide: active suicidal ideation, which we are addressing by inpatient psychiatry. Patient has following non-modifiable or demographic risk factors for suicide: male gender Patient has the following protective factors against suicide: Cultural, spiritual, or religious beliefs that discourage suicide  Thank you for this consult request. Recommendations have been communicated to the primary team.  We will recommend for inpatient psychiatry once medically clear at this time.   Mcneil Sober, NP       History of Present Illness  Relevant Aspects of Hospital ED Course:  Admitted on 02/27/2023 for suicidal ideations.   Patient Report:  "I took pills the other day but I guess it was not enough"  Psych ROS:  Depression: Yes.  History of depression.  Currently depressed. Anxiety: Yes.  History of generalized anxiety disorder Mania (lifetime and current): Denies Psychosis: (lifetime and current): Denies   Review of Systems  Psychiatric/Behavioral:  Positive for depression, substance abuse and suicidal ideas. Negative for hallucinations and memory loss. The patient has insomnia. The patient is not nervous/anxious.   All other systems reviewed and are negative.    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 1 Gram daily.  Prescription drug abuse: denies Rehab hx: yes "several times"  Exam Findings  Physical Exam: no abnormalities observed Vital Signs:  Temp:  [97.5 F (36.4 C)-97.9 F (36.6 C)] 97.5 F (36.4 C) (02/23 0818) Pulse Rate:  [93-105] 93 (02/23 0818) Resp:  [16-22] 16 (02/23 0818) BP: (129-156)/(71-97) 129/71 (02/23 0818) SpO2:  [95 %-98 %] 95 % (02/23 0818) Weight:  [75 kg] 75 kg (02/23 0157) Blood pressure 129/71, pulse 93, temperature (!) 97.5 F (36.4 C), temperature source Oral, resp. rate 16, height 5\' 10"  (1.778 m), weight 75 kg, SpO2 95%. Body mass index is 23.72 kg/m.  Physical Exam Vitals and nursing note reviewed.  Constitutional:      Appearance: He is well-developed.  Neurological:     General: No  focal deficit present.     Mental Status: He is alert and oriented to person, place, and time.     Mental Status Exam: General Appearance: Disheveled  Orientation:  Full (Time, Place, and Person)  Memory:  Immediate;   Good Recent;   Good Remote;   Good  Concentration:  Concentration: Good and Attention Span: Good  Recall:  Good  Attention  Good  Eye Contact:  Fair  Speech:  Clear and Coherent  Language:  Good  Volume:  Normal  Mood: irritable  Affect:  Congruent  Thought Process:  Goal Directed  Thought Content:  Illogical  Suicidal  Thoughts:  Yes.  with intent/plan  Homicidal Thoughts:  No  Judgement:  Impaired  Insight:  Lacking  Psychomotor Activity:  Normal  Akathisia:  No  Fund of Knowledge:  Good      Assets:  Communication Skills Desire for Improvement  Cognition:  WNL  ADL's:  Intact  AIMS (if indicated):        Other History   These have been pulled in through the EMR, reviewed, and updated if appropriate.  Family History:  The patient's family history includes Diabetes in an other family member; Hypertension in an other family member.  Medical History: Past Medical History:  Diagnosis Date   Anxiety    COPD (chronic obstructive pulmonary disease) (HCC)    Depression    Substance abuse (HCC)     Surgical History: Past Surgical History:  Procedure Laterality Date   NO PAST SURGERIES       Medications:   Current Facility-Administered Medications:    albuterol (VENTOLIN HFA) 108 (90 Base) MCG/ACT inhaler 2 puff, 2 puff, Inhalation, Q4H PRN, Dolores Frame, Jade J, MD   FLUoxetine (PROZAC) capsule 20 mg, 20 mg, Oral, Daily, Luara Faye, NP, 20 mg at 02/27/23 1139  Current Outpatient Medications:    albuterol (VENTOLIN HFA) 108 (90 Base) MCG/ACT inhaler, Inhale 2 puffs into the lungs every 4 (four) hours as needed for wheezing or shortness of breath., Disp: 1 each, Rfl: 0   amitriptyline (ELAVIL) 25 MG tablet, Take 25 mg by mouth at bedtime., Disp: , Rfl:    ARIPiprazole (ABILIFY) 5 MG tablet, Take 5 mg by mouth daily., Disp: , Rfl:    DULERA 200-5 MCG/ACT AERO, Inhale 2 puffs into the lungs in the morning and at bedtime., Disp: , Rfl:    FLUoxetine (PROZAC) 20 MG capsule, Take 1 capsule (20 mg total) by mouth daily., Disp: 30 capsule, Rfl: 0   IBU 800 MG tablet, Take 800 mg by mouth every 6 (six) hours as needed for moderate pain (pain score 4-6)., Disp: , Rfl:    risperiDONE (RISPERDAL) 1 MG tablet, Take 1 tablet (1 mg total) by mouth at bedtime., Disp: 30 tablet, Rfl: 0   traZODone (DESYREL) 50  MG tablet, Take 1 tablet (50 mg total) by mouth at bedtime as needed for sleep., Disp: 30 tablet, Rfl: 0   Vitamin D, Ergocalciferol, (DRISDOL) 1.25 MG (50000 UNIT) CAPS capsule, Take 50,000 Units by mouth once a week., Disp: , Rfl:    azithromycin (ZITHROMAX) 250 MG tablet, Take 1 tablet (250 mg total) by mouth daily. Take 1 tablet by mouth daily, first dose given in the emergency department (Patient not taking: Reported on 02/27/2023), Disp: 4 tablet, Rfl: 0   chlordiazePOXIDE (LIBRIUM) 25 MG capsule, 50mg  PO TID x 1D, then 25-50mg  PO BID X 1D, then 25-50mg  PO QD X 1D (Patient not taking: Reported on 02/27/2023),  Disp: 10 capsule, Rfl: 0   predniSONE (DELTASONE) 50 MG tablet, Take 1 tab by mouth daily (Patient not taking: Reported on 02/27/2023), Disp: 4 tablet, Rfl: 0   VENTOLIN HFA 108 (90 Base) MCG/ACT inhaler, Inhale 2 puffs into the lungs every 6 (six) hours as needed for wheezing or shortness of breath. (Patient not taking: Reported on 02/27/2023), Disp: , Rfl:   Allergies: No Known Allergies  Mcneil Sober, NP

## 2023-02-27 NOTE — ED Notes (Signed)
 Pt asked for a ginger ale. Provided by this NT.

## 2023-02-27 NOTE — Group Note (Signed)
 Date:  02/27/2023 Time:  10:25 PM  Group Topic/Focus:  Goals Group:   The focus of this group is to help patients establish daily goals to achieve during treatment and discuss how the patient can incorporate goal setting into their daily lives to aide in recovery.    Participation Level:  Did Not Attend  Participation Quality:   Did Not Attend  Affect:   Did Not Attend  Cognitive:   Did Not Attend  Insight: None  Engagement in Group:  None  Modes of Intervention:  Discussion  Additional Comments:    Garry Heater 02/27/2023, 10:25 PM

## 2023-02-27 NOTE — ED Notes (Signed)
 Report called to Izzy on Geropsych unit.

## 2023-02-27 NOTE — Progress Notes (Signed)
 ADMISSION NOTE:   Pt is a 53 y.o. male who was admitted voluntarily to [facility].  Pt reported that "I want help my drinking." Pt is alert and oriented x 4.  Pt present with blunted affect and irritable mood. Pt endorses passive SI, HI, however, denies AVH. Pt stated, " I need detox." Pt stated that his major stressor is being homeless and his drinking. Pt endorses use of illicit drugs.  He stated that he smokes 1 pack of cigarettes per day and drinks a 12 pk  12 ounces beer almost every day.  Pt stated that his goals are to "detox" and "get his life together."   Pt disclosed that he was previously hospitalized in December 2024 at [facility] for the same issues described above.     RN and pt discussed pt's admission plan of care and consents signed. Pt verbalized understanding of plan of care. RN oriented pt to the staff, unit, and room. Skin assessment was completed.  No negative findings noted.  Pt's belongings secured in locker. Routine safety checks initiated.  Pt is safe on the unit.

## 2023-02-27 NOTE — Plan of Care (Signed)
  Problem: Education: Goal: Knowledge of General Education information will improve Description: Including pain rating scale, medication(s)/side effects and non-pharmacologic comfort measures Outcome: Not Progressing   Problem: Health Behavior/Discharge Planning: Goal: Ability to manage health-related needs will improve Outcome: Not Progressing   Problem: Education: Goal: Knowledge of disease or condition will improve Outcome: Not Progressing

## 2023-02-27 NOTE — BH Assessment (Addendum)
 Patient is to be admitted to [facility] Psych today 02/27/23 by Dr.  Marval Regal .  Attending Physician will be Dr.  Marval Regal .   Patient has been assigned to room L26, by [facility] Charge Nurse Truchas.    ER staff is aware of the admission: French Ana, ER Secretary   Dr. Fuller Plan, ER MD  Morrie Sheldon, Patient's Nurse

## 2023-02-27 NOTE — BH Assessment (Signed)
 Comprehensive Clinical Assessment (CCA) Screening, Triage and Referral Note  02/27/2023 Franklin [name] [id number] Franklin [name] is a 53 y.o., Black race, Not Hispanic or Latino ethnicity, ENGLISH speaking male who presented ED voluntarily for an evaluation. Per triage note Pt states during triage "I am also feeling suicidal".  Pt admits to drinking ETOH today and doing crack cocaine.  Of note, pt. was demanding and verbally aggressive towards staff when asked to ambulate to the interview room. Pt requested a wheel chair due to having body pain. On assessment the pt. admitted that he relapsed while in caring services 2 weeks ago. Pt reported that he uses crack cocaine and alcohol; last use prior to arrival. Pt reported that he is currently homeless. The pt reported having hx of substance abuse treatment in the past; however, he was unable to remain sober. The pt. endorsed vague SI stating, "I want to kill myself; everything is fucked up." The pt. had fair insight and impaired judgement. Pt reported that he has been off of his psych medications for 2 weeks. Pt noted to have remarkable irritability. Pt was in the action stage of change, as he admitted that his substance use is problematic. The pt. is not connected to any services; however, pt. expressed a desire for rehab/substance abuse treatment. Pt was fixated on getting another food tray, explaining that he hasn't eaten in 3 days. Pt reported having sleep disturbance Pt was not responding to internal/external stimuli. Pt had rapid speech; however, his thoughts were linear. Pt was oriented x4. Pt presented with a labile mood; affect was congruent. Pt had a disheveled appearance. Pt's BAL is unremarkable; UDS pending. Pt denied current SI/AV/H.  Risk Factors:  Current ideation, Alcohol / Substance abuse, agitation, and poor self-control, Hopelessness, Co-morbid health problems / pain,  Previous suicide ideation or attempts, Current or previous history of  psychiatric diagnosis,  Protective Factors: Pt is able to ask for help. Pt is receptive to treatment. Flowsheet Row ED from 02/27/2023 in [facility] Emergency Department at [facility] Most recent reading at 02/27/2023  1:59 AM ED from 02/18/2023 in [facility] Emergency Department at [facility] Most recent reading at 02/18/2023 11:59 PM ED from 02/18/2023 in [facility] Most recent reading at 02/18/2023  9:55 PM  C-SSRS RISK CATEGORY No Risk No Risk No Risk       Chief Complaint:  Chief Complaint  Patient presents with   Psychiatric Evaluation   Shortness of Breath   Visit Diagnosis:  Cocaine abuse with cocaine-induced mood disorder [facility])    Patient Reported Information How did you hear about [place]? Self  What Is the Reason for Your Visit/Call Today? Pt reports, he was recently discharged from [facility] for Alcohol withdrawals. Pt reports, currently having some withdrawal symptoms. Pt reports, using a gram of Crack Cocaine, drank a 12 pack of beer and a pint of liquor at 0600. Pt reports, drinking and using drugs daily. Pt denies, SI, HI, hallucinations, self-injurious behaviors and access to weapons. Pt reports, "I don't feel safe because drinking is like suicide," when asking the pt if he can contract for safety.  How Long Has This Been Causing You Problems? > than 6 months  What Do You Feel Would Help You the Most Today? Alcohol or Drug Use Treatment; Medication(s)   Have You Recently Had Any Thoughts About Hurting Yourself? No  Are You Planning to Commit Suicide/Harm Yourself At This time? No   Have you Recently Had Thoughts About  Hurting Someone Franklin [name]? No  Are You Planning to Harm Someone at This Time? No  Explanation: None.   Have You Used Any Alcohol or Drugs in the Past 24 Hours? Yes  How Long Ago Did You Use Drugs or Alcohol? No data recorded What Did You Use and How Much? Pt reports, he used a gram of Crack Cocaine, drank a 12 pack  of beer and a pint of liquor at 0600. Pt reports, drinking and using drugs daily.   Do You Currently Have a Therapist/Psychiatrist? No  Name of Therapist/Psychiatrist: N/A   Have You Been Recently Discharged From Any Office Practice or Programs? No  Explanation of Discharge From Practice/Program: [place] on 05/11/2022    CCA Screening Triage Referral Assessment Type of Contact: Face-to-Face  Telemedicine Service Delivery:   Is this Initial or Reassessment?   Date Telepsych consult ordered in [facility]:    Time Telepsych consult ordered in [facility]:    Location of Assessment: [facility] ED  Provider Location: [facility] ED    Collateral Involvement: None   Does Patient Have a Court Appointed Legal Guardian? No data recorded Name and Contact of Legal Guardian: No data recorded If Minor and Not Living with Parent(s), Who has Custody? N/A  Is CPS involved or ever been involved? Never  Is APS involved or ever been involved? Never   Patient Determined To Be At Risk for Harm To Self or Others Based on Review of Patient Reported Information or Presenting Complaint? No  Method: No Plan (denies HI)  Availability of Means: No access or NA (denies HI)  Intent: Vague intent or NA (denies HI)  Notification Required: No need or identified person (denies HI)  Additional Information for Danger to Others Potential: Previous attempts (In 2012 or 2013)  Additional Comments for Danger to Others Potential: N/A  Are There Guns or Other Weapons in Your Home? No  Types of Guns/Weapons: N/A  Are These Weapons Safely Secured?                            No  Who Could Verify You Are Able To Have These Secured: N/A  Do You Have any Outstanding Charges, Pending Court Dates, Parole/Probation? Patient denies  Contacted To Inform of Risk of Harm To Self or Others: -- (N/A)   Does Patient Present under Involuntary Commitment? No    [place] of Residence: Franklin [name]   Patient Currently Receiving the Following  Services: Not Receiving Services   Determination of Need: Urgent (48 hours)   Options For Referral: Facility-Based Crisis; Medication Management; [facility]; Inpatient Hospitalization; Outpatient Therapy   Disposition Recommendation per psychiatric provider:   Foy [place], LCAS

## 2023-02-27 NOTE — ED Notes (Addendum)
 Pt dressed out:  [employer] toboggan Blue hoodie  Black coat [employer] paper scrub pants White socks

## 2023-02-27 NOTE — ED Notes (Signed)
 Pt provided with lunch tray. Pt sitting up eating.

## 2023-02-27 NOTE — Consult Note (Addendum)
 [facility] Psychiatric Consult Initial  Patient Name: .Franklin [name]  MRN: [id number]  DOB: 07/19/1970  Consult Order details:  Orders (From admission, onward)     Start     Ordered   02/27/23 0326  IP CONSULT TO PSYCHIATRY       Ordering Provider: Irean Hong, MD  Provider:  (Not yet assigned)  Question Answer Comment  Place call to: psychiatry   Reason for Consult Consult   Diagnosis/Clinical Info for Consult: cocaine abuse, SI      02/27/23 0326   02/27/23 0159  CONSULT TO CALL ACT TEAM       Ordering Provider: Irean Hong, MD  Provider:  (Not yet assigned)  Question:  Reason for Consult?  Answer:  [facility]   02/27/23 0159             Mode of Visit: Tele-visit Virtual Statement:TELE PSYCHIATRY ATTESTATION & CONSENT As the provider for this telehealth consult, I attest that I verified the patient's identity using two separate identifiers, introduced myself to the patient, provided my credentials, disclosed my location, and performed this encounter via a HIPAA-compliant, real-time, face-to-face, two-way, interactive audio and video platform and with the full consent and agreement of the patient (or guardian as applicable.) Patient physical location: [facility]. Telehealth provider physical location: home office in state of [place].   Video start time:   Video end time:      Psychiatry Consult Evaluation  Service Date: February 27, 2023 LOS:  LOS: 0 days  Chief Complaint Patient presents to the Emergency Department (ED) from a gas station after calling EMS for shortness of breath (SOB) throughout the day. During triage, the patient also expressed suicidal ideation (SI).  Primary Psychiatric Diagnoses  Malingering Cocaine use disorder, severe, dependence (HCC) Substance abuse (HCC) Chronic obstructive pulmonary disease (HCC)  Assessment  This is a homeless male with a history of substance use disorder, multiple ED visits, and psychiatric admissions, presenting with SI in the  context of recent relapse. He has been seen in the ED multiple times in the past six months and is currently requesting substance use treatment.  Psychiatric Concerns:  Actively endorsing SI; however, chronic pattern of ED visits  Needs further assessment for inpatient psychiatric admission vs. detox placement  Recommend close monitoring in ED with further psychiatric evaluation  Substance Use:  Reports drinking alcohol and using crack cocaine today  Interest in treatment; consider referral to detox or rehab facility  Possible withdrawal management needed  Medical Concerns:  SOB improved with nebulizer treatment; possible underlying COPD/asthma  Continue monitoring respiratory status  Disposition:  Reassess in the AM for further evaluation of SI and possible inpatient psychiatric admission  Consider inpatient detox placement if deemed appropriate  Social work consult for assistance with placement and resources  Safety plan as needed   Diagnoses:  Active Hospital problems: Principal Problem:   Malingering Active Problems:   Cocaine use disorder, severe, dependence (HCC)   Substance abuse (HCC)   Chronic obstructive pulmonary disease (HCC)    Plan   ## Psychiatric Medication Recommendations:  Resume Home medications once reconciled  ## Medical Decision Making Capacity: Not specifically addressed in this encounter  ## Further Work-up:   Routine labs ordered, which include  Lab Orders         Resp panel by RT-PCR (RSV, Flu A&B, Covid) Anterior Nasal Swab         Comprehensive metabolic panel         Ethanol  Salicylate level         Acetaminophen level         cbc         Urine Drug Screen, Qualitative    Medication Management: Medications started  predniSONE  60 mg Oral Once   Will maintain observation checks every 15 minutes for safety. Psychosocial education regarding relapse prevention and self-care; social and communication  Social work  will consult with family for collateral information and discuss discharge and follow up plan.   ## Disposition:-- Reassess in the AM  ## Behavioral / Environmental: - No specific recommendations at this time.     ## Safety and Observation Level:  - Based on my clinical evaluation, I estimate the patient to be at low risk of self harm in the current setting. - At this time, we recommend  routine. This decision is based on my review of the chart including patient's history and current presentation, interview of the patient, mental status examination, and consideration of suicide risk including evaluating suicidal ideation, plan, intent, suicidal or self-harm behaviors, risk factors, and protective factors. This judgment is based on our ability to directly address suicide risk, implement suicide prevention strategies, and develop a safety plan while the patient is in the clinical setting. Please contact our team if there is a concern that risk level has changed.  CSSR Risk Category:C-SSRS RISK CATEGORY: No Risk  Suicide Risk Assessment: Patient has following modifiable risk factors for suicide: medication noncompliance and lack of access to outpatient mental health resources, which we are addressing by reassessing. Patient has following non-modifiable or demographic risk factors for suicide: male gender and psychiatric hospitalization Patient has the following protective factors against suicide: Frustration tolerance  Thank you for this consult request. Recommendations have been communicated to the primary team.  We will reassess in the AM at this time.   Jearld Lesch, NP       History of Present Illness  The patient is a homeless male who was brought in by EMS after experiencing SOB. EMS noted an expiratory wheeze, and the patient received one nebulizer (neb) treatment with reported improvement. Initial EMS vitals were:  Blood Pressure: 138/100  Heart Rate: 108  Oxygen Saturation: 98%  on Room Air (RA)  Upon arrival at the ED, the patient was alert and oriented x4, in no acute distress, and ambulatory from EMS bay to triage. During triage, the patient reported feeling better after the neb treatment but disclosed feeling suicidal. The patient admitted to alcohol (ETOH) consumption and crack cocaine use earlier today.  The patient reports a history of recent hospital admissions, stating he was last admitted in December. He relapsed from [facility] approximately two weeks ago, which resulted in his current homelessness. He expresses a desire to seek treatment for substance use. He endorses SI at this time but has been seen in the ED nine times in the last six months and admitted three times.   Psych ROS:  Depression: current Anxiety:  denies Mania (lifetime and current): NA Psychosis: (lifetime and current): NA  Review of Systems  Psychiatric/Behavioral:  Positive for depression, substance abuse and suicidal ideas. The patient has insomnia.   All other systems reviewed and are negative.    Psychiatric and Social History  Psychiatric History:   History of multiple ED visits for psychiatric concerns  Prior inpatient psychiatric admissions (last in December)  History of substance use disorder  Reports prior engagement with [facility] (relapsed two weeks ago)  Past Medical History:  Substance use disorder (alcohol, crack cocaine)  Possible COPD/Asthma (expiratory wheeze noted by EMS, improved with nebulizer treatment)  Medications:  Unknown (patient unable to provide current medication list)  Allergies:  No known drug allergies reported   Exam Findings  Physical Exam:  Vital Signs:  Temp:  [97.9 F (36.6 C)] 97.9 F (36.6 C) (02/23 0157) Pulse Rate:  [105] 105 (02/23 0157) Resp:  [22] 22 (02/23 0157) BP: (156)/(97) 156/97 (02/23 0157) SpO2:  [98 %] 98 % (02/23 0157) Weight:  [75 kg] 75 kg (02/23 0157) Blood pressure (!) 156/97, pulse (!)  105, temperature 97.9 F (36.6 C), temperature source Oral, resp. rate (!) 22, height 5\' 10"  (1.778 m), weight 75 kg, SpO2 98%. Body mass index is 23.72 kg/m.  Physical Exam Vitals and nursing note reviewed.  HENT:     Head: Normocephalic and atraumatic.     Nose: Nose normal.  Eyes:     Extraocular Movements: Extraocular movements intact.     Pupils: Pupils are equal, round, and reactive to light.  Pulmonary:     Effort: Pulmonary effort is normal.  Musculoskeletal:        General: Normal range of motion.     Cervical back: Normal range of motion.  Skin:    General: Skin is dry.  Neurological:     Mental Status: He is oriented to person, place, and time.  Psychiatric:        Attention and Perception: Attention and perception normal.        Mood and Affect: Mood is depressed. Affect is flat.        Speech: Speech normal.        Behavior: Behavior is agitated. Behavior is cooperative.        Thought Content: Thought content includes suicidal ideation. Thought content does not include suicidal plan.        Cognition and Memory: Cognition and memory normal.        Judgment: Judgment is impulsive and inappropriate.     Mental Status Exam: Appearance: Disheveled, appropriate for stated homelessness  Behavior: Cooperative but mildly restless  Speech: Normal rate, volume, and coherence  Mood: Depressed  Affect: Constricted  Thought Process: Linear  Thought Content: Endorses SI, no homicidal ideation (HI), no overt psychosis  Cognition: Alert and oriented x4  Insight/Judgment: Limited insight into substance use and recurrent ED visits  Assets:  Desire for Improvement  Cognition:  WNL  ADL's:  Impaired  AIMS (if indicated):        Other History   These have been pulled in through the EMR, reviewed, and updated if appropriate.  Family History:  The patient's family history includes Diabetes in an other family member; Hypertension in an other family member.  Medical  History: Past Medical History:  Diagnosis Date   Anxiety    COPD (chronic obstructive pulmonary disease) (HCC)    Depression    Substance abuse (HCC)     Surgical History: Past Surgical History:  Procedure Laterality Date   NO PAST SURGERIES       Medications:   Current Facility-Administered Medications:    albuterol (VENTOLIN HFA) 108 (90 Base) MCG/ACT inhaler 2 puff, 2 puff, Inhalation, Q4H PRN, Dolores Frame, Jade J, MD   predniSONE (DELTASONE) tablet 60 mg, 60 mg, Oral, Once, Irean Hong, MD  Current Outpatient Medications:    albuterol (VENTOLIN HFA) 108 (90 Base) MCG/ACT inhaler, Inhale 2 puffs into the lungs every 4 (four) hours as  needed for wheezing or shortness of breath., Disp: 1 each, Rfl: 0   amitriptyline (ELAVIL) 25 MG tablet, Take 25 mg by mouth at bedtime., Disp: , Rfl:    ARIPiprazole (ABILIFY) 5 MG tablet, Take 5 mg by mouth daily., Disp: , Rfl:    azithromycin (ZITHROMAX) 250 MG tablet, Take 1 tablet (250 mg total) by mouth daily. Take 1 tablet by mouth daily, first dose given in the emergency department, Disp: 4 tablet, Rfl: 0   chlordiazePOXIDE (LIBRIUM) 25 MG capsule, 50mg  PO TID x 1D, then 25-50mg  PO BID X 1D, then 25-50mg  PO QD X 1D, Disp: 10 capsule, Rfl: 0   DULERA 200-5 MCG/ACT AERO, Inhale 2 puffs into the lungs in the morning and at bedtime., Disp: , Rfl:    FLUoxetine (PROZAC) 20 MG capsule, Take 1 capsule (20 mg total) by mouth daily., Disp: 30 capsule, Rfl: 0   IBU 800 MG tablet, Take 800 mg by mouth every 6 (six) hours as needed for moderate pain (pain score 4-6)., Disp: , Rfl:    predniSONE (DELTASONE) 50 MG tablet, Take 1 tab by mouth daily, Disp: 4 tablet, Rfl: 0   risperiDONE (RISPERDAL) 1 MG tablet, Take 1 tablet (1 mg total) by mouth at bedtime., Disp: 30 tablet, Rfl: 0   traZODone (DESYREL) 50 MG tablet, Take 1 tablet (50 mg total) by mouth at bedtime as needed for sleep., Disp: 30 tablet, Rfl: 0   VENTOLIN HFA 108 (90 Base) MCG/ACT inhaler, Inhale 2  puffs into the lungs every 6 (six) hours as needed for wheezing or shortness of breath., Disp: , Rfl:    Vitamin D, Ergocalciferol, (DRISDOL) 1.25 MG (50000 UNIT) CAPS capsule, Take 50,000 Units by mouth once a week., Disp: , Rfl:   Allergies: No Known Allergies  Raylen Tangonan Damaris Hippo, NP

## 2023-02-27 NOTE — ED Provider Notes (Signed)
 [facility] Provider Note    Event Date/Time   First MD Initiated Contact with Patient 02/27/23 0315     (approximate)   History   Psychiatric Evaluation and Shortness of Breath   HPI  Franklin [name] is a 53 y.o. male brought to the ED via EMS from gas station for shortness of breath status post 1 DuoNeb by EMS, feeling better.  Patient is homeless and states he is feeling suicidal.  Endorses EtOH and crack cocaine use.  Denies active HI/AH/VH.  Denies fever/chills, chest pain, abdominal pain, nausea, vomiting or dizziness.     Past Medical History   Past Medical History:  Diagnosis Date  . Anxiety   . COPD (chronic obstructive pulmonary disease) (HCC)   . Depression   . Substance abuse [facility])      Active Problem List   Patient Active Problem List   Diagnosis Date Noted  . Malingering 02/27/2023  . Cocaine abuse with cocaine-induced mood disorder (HCC) 12/03/2022  . Major depressive disorder, recurrent severe without psychotic features (HCC) 10/27/2022  . PVC (premature ventricular contraction) 07/13/2022  . Solitary pulmonary nodule 07/13/2022  . Prediabetes 07/13/2022  . Abnormal echocardiogram 07/13/2022  . Chronic obstructive pulmonary disease (HCC) 06/18/2022  . Generalized anxiety disorder   . Substance abuse (HCC) 03/22/2015  . Tobacco abuse 10/06/2014  . Cocaine use disorder, severe, dependence (HCC) 10/02/2014  . Alcohol use disorder, severe, dependence (HCC) 06/18/2014     Past Surgical History   Past Surgical History:  Procedure Laterality Date  . NO PAST SURGERIES       Home Medications   Prior to Admission medications   Medication Sig Start Date End Date Taking? Authorizing Provider  albuterol (VENTOLIN HFA) 108 (90 Base) MCG/ACT inhaler Inhale 2 puffs into the lungs every 4 (four) hours as needed for wheezing or shortness of breath. 02/18/23   Durwin Glaze, MD  amitriptyline (ELAVIL) 25 MG tablet Take 25 mg by mouth  at bedtime. 02/03/23   [provider]  ARIPiprazole (ABILIFY) 5 MG tablet Take 5 mg by mouth daily. 02/03/23   [provider]  azithromycin (ZITHROMAX) 250 MG tablet Take 1 tablet (250 mg total) by mouth daily. Take 1 tablet by mouth daily, first dose given in the emergency department 02/18/23   Durwin Glaze, MD  chlordiazePOXIDE (LIBRIUM) 25 MG capsule 50mg  PO TID x 1D, then 25-50mg  PO BID X 1D, then 25-50mg  PO QD X 1D 02/18/23   Durwin Glaze, MD  DULERA 200-5 MCG/ACT AERO Inhale 2 puffs into the lungs in the morning and at bedtime. 01/13/23   [provider]  FLUoxetine (PROZAC) 20 MG capsule Take 1 capsule (20 mg total) by mouth daily. 02/19/23   Antony Madura, PA-C  IBU 800 MG tablet Take 800 mg by mouth every 6 (six) hours as needed for moderate pain (pain score 4-6). 01/28/23   [provider]  predniSONE (DELTASONE) 50 MG tablet Take 1 tab by mouth daily 02/18/23   Durwin Glaze, MD  risperiDONE (RISPERDAL) 1 MG tablet Take 1 tablet (1 mg total) by mouth at bedtime. 02/19/23 03/21/23  Antony Madura, PA-C  traZODone (DESYREL) 50 MG tablet Take 1 tablet (50 mg total) by mouth at bedtime as needed for sleep. 02/19/23   Antony Madura, PA-C  VENTOLIN HFA 108 (90 Base) MCG/ACT inhaler Inhale 2 puffs into the lungs every 6 (six) hours as needed for wheezing or shortness of breath. 01/13/23  [provider]  Vitamin D, Ergocalciferol, (DRISDOL) 1.25 MG (50000 UNIT) CAPS capsule Take 50,000 Units by mouth once a week. 02/09/23   [provider]     Allergies  Patient has no known allergies.   Family History   Family History  Problem Relation Age of Onset  . Hypertension Other   . Diabetes Other      Physical Exam  Triage Vital Signs: ED Triage Vitals [02/27/23 0157]  Encounter Vitals Group     BP (!) 156/97     Systolic BP Percentile      Diastolic BP Percentile      Pulse Rate (!) 105     Resp (!) 22     Temp 97.9 F (36.6 C)     Temp  Source Oral     SpO2 98 %     Weight 165 lb 5.5 oz (75 kg)     Height 5\' 10"  (1.778 m)     Head Circumference      Peak Flow      Pain Score 0     Pain Loc      Pain Education      Exclude from Growth Chart     Updated Vital Signs: BP (!) 156/97   Pulse (!) 105   Temp 97.9 F (36.6 C) (Oral)   Resp (!) 22   Ht 5\' 10"  (1.778 m)   Wt 75 kg   SpO2 98%   BMI 23.72 kg/m    General: Awake, no distress.  CV:  RRR.  Good peripheral perfusion.  Resp:  Normal effort.  CTAB. Abd:  Nontender.  No distention.  Other:  Cooperative.   ED Results / Procedures / Treatments  Labs (all labs ordered are listed, but only abnormal results are displayed) Labs Reviewed  COMPREHENSIVE METABOLIC PANEL - Abnormal; Notable for the following components:      Result Value   Glucose, Bld 106 (*)    All other components within normal limits  SALICYLATE LEVEL - Abnormal; Notable for the following components:   Salicylate Lvl <7.0 (*)    All other components within normal limits  ACETAMINOPHEN LEVEL - Abnormal; Notable for the following components:   Acetaminophen (Tylenol), Serum <10 (*)    All other components within normal limits  CBC - Abnormal; Notable for the following components:   Platelets 413 (*)    All other components within normal limits  RESP PANEL BY RT-PCR (RSV, FLU A&B, COVID)  [id number]  ETHANOL  URINE DRUG SCREEN, QUALITATIVE ([facility] ONLY)  TROPONIN I (HIGH SENSITIVITY)  TROPONIN I (HIGH SENSITIVITY)     EKG  ED ECG REPORT I, Yakelin Grenier J, the attending physician, personally viewed and interpreted this ECG.   Date: 02/27/2023  EKG Time: 0212  Rate: 102  Rhythm: sinus tachycardia  Axis: Normal  Intervals:none  ST&T Change: Nonspecific    RADIOLOGY I have independently visualized and interpreted patient's imaging study as well as noted the radiology interpretation:  Chest x-ray: No acute cardiopulmonary process  Official radiology report(s): DG Chest Port 1  View Result Date: 02/27/2023 CLINICAL DATA:  Shortness of breath EXAM: PORTABLE CHEST 1 VIEW COMPARISON:  02/18/2023 FINDINGS: Heart and mediastinal contours are within normal limits. No focal opacities or effusions. No acute bony abnormality. IMPRESSION: No active disease. Electronically Signed   By: Charlett Nose M.D.   On: 02/27/2023 02:32     PROCEDURES:  Critical Care performed: No  Procedures   MEDICATIONS ORDERED IN  ED: Medications  predniSONE (DELTASONE) tablet 60 mg (has no administration in time range)  albuterol (VENTOLIN HFA) 108 (90 Base) MCG/ACT inhaler 2 puff (has no administration in time range)     IMPRESSION / MDM / ASSESSMENT AND PLAN / ED COURSE  I reviewed the triage vital signs and the nursing notes.                             52 year old male with COPD presenting with shortness of breath and suicidal ideation.  Shortness of breath improved after DuoNeb given by EMS.  Will add prednisone.  Patient contracts for safety while in the emergency department.  Will keep patient voluntary pending psychiatric evaluation and disposition.  Laboratory results and chest x-ray unremarkable.  Patient is medically cleared at this time for psychiatric evaluation and disposition.  Patient's presentation is most consistent with exacerbation of chronic illness.  The patient has been placed in psychiatric observation due to the need to provide a safe environment for the patient while obtaining psychiatric consultation and evaluation, as well as ongoing medical and medication management to treat the patient's condition.  The patient has not been placed under full IVC at this time.  Clinical Course as of 02/27/23 0521  Sun Feb 27, 2023  0521 Patient requesting STD testing. [JS]    Clinical Course User Index [JS] Irean Hong, MD   FINAL CLINICAL IMPRESSION(S) / ED DIAGNOSES   Final diagnoses:  COPD exacerbation (HCC)  Suicidal ideation     Rx / DC Orders   ED Discharge  Orders     None        Note:  This document was prepared using Dragon voice recognition software and may include unintentional dictation errors.   Irean Hong, MD 02/27/23 [phone]

## 2023-02-27 NOTE — ED Notes (Signed)
 TTS Consult came to speak with the pt and asked pt if he could walk with her to do the consultation. The pt stated that he could not walk and needed a wheelchair. The consult personnel asked me if the pt was ambulatory and I advised her that he was ambulatory as he ambulated to his bed from triage as well as ambulated to the restroom and back without problem or incident. The pt became angry and starting cursing stating that I was calling him a liar and that he was in pain. I advised the pt that I did not state that he was a liar, only that he was in fact ambulatory.

## 2023-02-28 DIAGNOSIS — F1994 Other psychoactive substance use, unspecified with psychoactive substance-induced mood disorder: Secondary | ICD-10-CM | POA: Diagnosis not present

## 2023-02-28 MED ORDER — VITAMIN D (ERGOCALCIFEROL) 1.25 MG (50000 UNIT) PO CAPS
50000.0000 [IU] | ORAL_CAPSULE | ORAL | Status: DC
  Administered 2023-03-04 – 2023-03-10 (×2): 50000 [IU] via ORAL
  Filled 2023-02-28 (×3): qty 1

## 2023-02-28 NOTE — Plan of Care (Addendum)
 D: Pt alert and oriented. Pt report experiencing anxiety/depression at this time. Pt reports experiencing 5/10 generalized body aches and pain at this time, prn pain management medication offered and were refused by the pt. Pt stated he was okay for now. Pt educated on pain management and pain management medication. Pt denies experiencing any SI/HI, or AVH at this time.   A: Scheduled medications administered to pt, per MD orders. Support and encouragement provided. Frequent verbal contact made. Routine safety checks conducted q15 minutes.   R: No adverse drug reactions noted. Pt verbally contracts for safety at this time. Pt compliant with medications. Pt interacts minimally with others on the unit. Pt remains safe at this time. Plan of care ongoing.  Problem: Nutrition: Goal: Adequate nutrition will be maintained Outcome: Progressing   Problem: Coping: Goal: Level of anxiety will decrease Outcome: Not Progressing

## 2023-02-28 NOTE — Group Note (Signed)
 Recreation Therapy Group Note   Group Topic:General Recreation  Group Date: 02/28/2023 Start Time: 1400 End Time: 1500 Facilitators: Rosina Lowenstein, LRT, CTRS Location: Courtyard  Group Description: Outdoor Recreation. Patients had the option to play corn hole, ring toss, bowling or listening to music while outside in the courtyard getting fresh air and sunlight. LRT and patients discussed things that they enjoy doing in their free time outside of the hospital. LRT encouraged patients to drink water after being active and getting their heart rate up.   Goal Area(s) Addressed: Patient will identify leisure interests.  Patient will practice healthy decision making. Patient will engage in recreation activity.   Affect/Mood: N/A   Participation Level: Did not attend    Clinical Observations/Individualized Feedback: Patient did not attend group.   Plan: Continue to engage patient in RT group sessions 2-3x/week.   Rosina Lowenstein, LRT, CTRS 02/28/2023 4:42 PM

## 2023-02-28 NOTE — Plan of Care (Signed)

## 2023-02-28 NOTE — [facility] Suicide Risk Assessment (Signed)
 [facility] INPATIENT:  Family/Significant Other Suicide Prevention Education  Suicide Prevention Education:  Patient Refusal for Family/Significant Other Suicide Prevention Education: The patient Franklin [name] has refused to provide written consent for family/significant other to be provided Family/Significant Other Suicide Prevention Education during admission and/or prior to discharge.  Physician notified.  Elza Rafter 02/28/2023, 3:30 PM

## 2023-02-28 NOTE — [facility] Suicide Risk Assessment (Signed)
 [facility] Admission Suicide Risk Assessment   Nursing information obtained from:  Patient Demographic factors:  Male, Low socioeconomic status Current Mental Status:  Suicidal ideation indicated by patient Loss Factors:  Financial problems / change in socioeconomic status Historical Factors:  NA Risk Reduction Factors:  NA  Total Time spent with patient: 30 minutes Principal Problem: <principal problem not specified> Diagnosis:  Active Problems:   MDD (major depressive disorder)  Subjective Data: Franklin [name] is a 53 y.o. male presenting to [facility] emergency department on 02/27/2023  2:25 AM for suicidal ideations.  Patient reports going to take some pills or either cut my wrist.  Patient states I took some pills a few days ago but I guess it was not enough.  He has a psychiatric history of major depressive disorder, suicidal ideation, cocaine use disorder, alcohol use disorder, tobacco abuse, and generalized anxiety disorder   Continued Clinical Symptoms:  Alcohol Use Disorder Identification Test Final Score (AUDIT): 22 The "Alcohol Use Disorders Identification Test", Guidelines for Use in Primary Care, Second Edition.  World Science writer [facility]). Score between 0-7:  no or low risk or alcohol related problems. Score between 8-15:  moderate risk of alcohol related problems. Score between 16-19:  high risk of alcohol related problems. Score 20 or above:  warrants further diagnostic evaluation for alcohol dependence and treatment.   CLINICAL FACTORS:   Depression:   Comorbid alcohol abuse/dependence Hopelessness Alcohol/Substance Abuse/Dependencies   Musculoskeletal: Strength & Muscle Tone: within normal limits Gait & Station: normal Patient leans: N/A  Psychiatric Specialty Exam:  Presentation  General Appearance:  Appropriate for Environment  Eye Contact: Good  Speech: Clear and Coherent  Speech Volume: Normal  Handedness: Right   Mood  and Affect  Mood: Euthymic  Affect: Congruent   Thought Process  Thought Processes: Coherent  Descriptions of Associations:Intact  Orientation:Full (Time, Place and Person)  Thought Content:WDL  History of Schizophrenia/Schizoaffective disorder:No  Duration of Psychotic Symptoms:Greater than six months  Hallucinations:No data recorded Ideas of Reference:None  Suicidal Thoughts:No data recorded Homicidal Thoughts:No data recorded  Sensorium  Memory: Immediate Good; Recent Good; Remote Good  Judgment: Fair  Insight: Good   Executive Functions  Concentration: Good  Attention Span: Good  Recall: Good  Fund of Knowledge: Good  Language: Good   Psychomotor Activity  Psychomotor Activity:No data recorded  Assets  Assets: Desire for Improvement; Physical Health; Social Support   Sleep  Sleep:No data recorded   Physical Exam: Physical Exam ROS Blood pressure 116/69, pulse 73, temperature 98.4 F (36.9 C), resp. rate 14, height 5\' 10"  (1.778 m), weight 73.5 kg, SpO2 96%. Body mass index is 23.24 kg/m.   COGNITIVE FEATURES THAT CONTRIBUTE TO RISK:  Thought constriction (tunnel vision)    SUICIDE RISK:   Moderate:  Frequent suicidal ideation with limited intensity, and duration, some specificity in terms of plans, no associated intent, good self-control, limited dysphoria/symptomatology, some risk factors present, and identifiable protective factors, including available and accessible social support.  PLAN OF CARE: Patient is admitted to the inpatient geropsych unit with Q 15 min safety checks. Multidisciplinary team approach is offered, medication management, group/milieu therapy offered,  I certify that inpatient services furnished can reasonably be expected to improve the patient's condition.   Verner Chol, MD 02/28/2023, 1:40 PM

## 2023-02-28 NOTE — H&P (Addendum)
 Psychiatric Admission Assessment Adult  Patient Identification: Franklin [name] MRN:  [id number] Date of Evaluation:  02/28/2023 Chief Complaint:  MDD (major depressive disorder) [F32.9]   History of Present Illness: Franklin [name] is a 53 y.o. male presenting to [facility] emergency department on 02/27/2023 2:25 AM for suicidal ideations. Patient reports going to take some pills or either cut my wrist. Patient states I took some pills a few days ago but I guess it was not enough. He has a psychiatric history of major depressive disorder, suicidal ideation, cocaine use disorder, alcohol use disorder, tobacco abuse, and generalized anxiety disorder . Chart is reviewed including HPI, consult note, Psych social/family/,medical history.  On interview patient reports struggling with alcohol, crack cocaine, being off of his medications for some time, worsening depression with suicidal ideation.  He reports feeling depressed for last 3 years and reports feeling hopeless, worthless, denies anhedonia as he reports he still enjoys playing basketball, bowling, poor energy and motivation, fair sleep and appetite.  He reports chronic anxiety with intermittent panic attacks.  He denies any history of physical/sexual abuse.  He denies current nightmares or flashbacks.  He denies having racing thoughts, high surges of energy.  No grandiose delusions displayed or reported.  He reports daily use of alcohol since 1920s and has history of withdrawals and seizures withdrawing from alcohol.  He has been using crack cocaine for 20 years and last use was 2 days ago.  He denies any use of marijuana but smokes cigarettes.  He is requesting for help to get back on his medications and some resources from social work.   Total Time spent with patient: 1 hour Sleep  Sleep:No data recorded Past Psychiatric History:  Psychiatric History:  Information collected from patient  Prev Dx/Sx: MDD Current Psych  Provider: Denies Home Meds (current): Prozac 20 mg Previous Med Trials: Abilify 5 mg, Risperdal 1 mg, trazodone 50 mg, amitriptyline 25 mg Therapy: Yes   Prior Psych Hospitalization: Yes, more than 20 in his lifetime Prior Self Harm: Yes, previous suicide attempt was 5 years ago with plan to hang himself Prior Violence: Denies   Family Psych History: Denies Family Hx suicide: Denies   Social History:  Developmental Hx: normal Educational Hx: two years of college majoring in sociology 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 1 Gram daily.  Prescription drug abuse: denies Rehab hx: yes "several times" Is the patient at risk to self? Yes.    Has the patient been a risk to self in the past 6 months? No.  Has the patient been a risk to self within the distant past? No.  Is the patient a risk to others? No.  Has the patient been a risk to others in the past 6 months? No.  Has the patient been a risk to others within the distant past? No.   [place] Scale:  Flowsheet Row Admission (Current) from 02/27/2023 in [facility] Most recent reading at 02/27/2023  3:00 PM ED  from 02/27/2023 in [facility] Emergency Department at [facility] Most recent reading at 02/27/2023  1:59 AM ED from 02/18/2023 in [facility] Emergency Department at [facility] Most recent reading at 02/18/2023 11:59 PM  C-SSRS RISK CATEGORY Low Risk No  Risk No Risk        Past Medical History:  Past Medical History:  Diagnosis Date   Anxiety    COPD (chronic obstructive pulmonary disease) (HCC)    Depression    Substance abuse (HCC)     Past Surgical History:  Procedure Laterality Date   NO PAST SURGERIES     Family History:  Family History  Problem Relation Age of Onset   Hypertension Other    Diabetes Other     Social History:  Social History   Substance and Sexual Activity  Alcohol Use Yes   Alcohol/week: 12.0 standard drinks of alcohol   Types: 12 Cans of beer per week     Social History   Substance and Sexual Activity  Drug Use Yes   Types: "Crack" cocaine, Cocaine   Comment: one gram daily      Allergies:  No Known Allergies Lab Results:  Results for orders placed or performed during the hospital encounter of 02/27/23 (from the past 48 hours)  Comprehensive metabolic panel     Status: Abnormal   Collection Time: 02/27/23  2:01 AM  Result Value Ref Range   Sodium 137 135 - 145 mmol/L   Potassium 3.6 3.5 - 5.1 mmol/L   Chloride 99 98 - 111 mmol/L   CO2 25 22 - 32 mmol/L   Glucose, Bld 106 (H) 70 - 99 mg/dL    Comment: Glucose reference range applies only to samples taken after fasting for at least 8 hours.   BUN 11 6 - 20 mg/dL   Creatinine, Ser 2.84 0.61 - 1.24 mg/dL   Calcium 9.5 8.9 - 13.2 mg/dL   Total Protein 7.6 6.5 - 8.1 g/dL   Albumin 4.1 3.5 - 5.0 g/dL   AST 27 15 - 41 U/L   ALT 20 0 - 44 U/L   Alkaline Phosphatase 59 38 - 126 U/L   Total Bilirubin 1.0 0.0 - 1.2 mg/dL   GFR, Estimated >44 >01 mL/min    Comment: (NOTE) Calculated using the CKD-EPI Creatinine Equation (2021)    Anion gap 13 5 - 15    Comment: Performed at [facility], [address] [place], [place] [address]  Ethanol     Status: None   Collection Time: 02/27/23  2:01 AM  Result Value Ref Range   Alcohol, Ethyl (B) <10 <10 mg/dL    Comment: (NOTE) Lowest detectable limit for serum alcohol is 10  mg/dL.  For medical purposes only. Performed at [facility], [address] [place], [place] [address]   Salicylate level     Status: Abnormal   Collection Time: 02/27/23  2:01 AM  Result Value Ref Range   Salicylate Lvl <7.0 (L) 7.0 - 30.0 mg/dL    Comment: Performed at [facility], [address] [place], [place] [address]  Acetaminophen level     Status: Abnormal   Collection Time: 02/27/23  2:01 AM  Result Value Ref Range   Acetaminophen (Tylenol), Serum <10 (L) 10 - 30 ug/mL    Comment: (NOTE) Therapeutic concentrations vary significantly. A range of 10-30 ug/mL  may be an effective concentration for many patients. However, some  are best treated at concentrations outside  of this range. Acetaminophen concentrations >150 ug/mL at 4 hours after ingestion  and >50 ug/mL at 12 hours after ingestion are often associated with  toxic reactions.  Performed at [facility], [address] [place], [place] [address]   cbc     Status: Abnormal   Collection Time: 02/27/23  2:01 AM  Result Value Ref Range   WBC 7.0 4.0 - 10.5 K/uL   RBC 4.92 4.22 - 5.81 MIL/uL   Hemoglobin 14.4 13.0 - 17.0 g/dL   HCT 60.4 54.0 - 98.1 %   MCV 89.6 80.0 - 100.0 fL   MCH 29.3 26.0 - 34.0 pg   MCHC 32.7 30.0 - 36.0 g/dL   RDW 19.1 47.8 - 29.5 %   Platelets 413 (H) 150 - 400 K/uL   nRBC 0.0 0.0 - 0.2 %    Comment: Performed at [facility], [address] [place], [place] [address]  Troponin I (High Sensitivity)     Status: None   Collection Time: 02/27/23  2:01 AM  Result Value Ref Range   Troponin I (High Sensitivity) 5 <18 ng/L    Comment: (NOTE) Elevated high sensitivity troponin I (hsTnI) values and significant  changes across serial measurements may suggest ACS but many other  chronic and acute conditions are known to elevate hsTnI results.  Refer to the "Links" section for chest pain algorithms and additional  guidance. Performed at [facility], [address] [place], [place] [address]   Resp panel by RT-PCR (RSV, Flu A&B, Covid) Anterior Nasal Swab     Status: None   Collection Time: 02/27/23  2:01 AM   Specimen: Anterior Nasal Swab  Result Value Ref Range   SARS Coronavirus 2 by RT PCR NEGATIVE NEGATIVE    Comment: (NOTE) SARS-CoV-2 target nucleic acids are NOT DETECTED.  The SARS-CoV-2 RNA is generally detectable in upper respiratory specimens during the acute phase of infection. The lowest concentration of SARS-CoV-2 viral copies this assay can detect is 138 copies/mL. A negative result does not preclude SARS-Cov-2 infection and should not be used as the sole basis for treatment or other patient management decisions. A negative result may occur with  improper specimen collection/handling, submission of specimen other than nasopharyngeal swab, presence of viral mutation(s) within the areas targeted by this assay, and inadequate number of viral copies(<138 copies/mL). A negative result must be combined with clinical observations, patient history, and epidemiological information. The expected result is Negative.  Fact Sheet for Patients:  BloggerCourse.com  Fact Sheet for Healthcare Providers:  SeriousBroker.it  This test is no t yet approved or cleared by the [place] FDA and  has been authorized for detection and/or diagnosis of SARS-CoV-2 by FDA under an Emergency Use Authorization (EUA). This EUA will remain  in effect (meaning this test can be used) for the duration of the COVID-19 declaration under Section 564(b)(1) of the Act, 21 U.S.C.section 360bbb-3(b)(1), unless the authorization is terminated  or revoked sooner.       Influenza A by PCR NEGATIVE NEGATIVE   Influenza B by PCR NEGATIVE NEGATIVE    Comment: (NOTE) The Xpert Xpress SARS-CoV-2/FLU/RSV plus assay is intended as an aid in the diagnosis of influenza from Nasopharyngeal  swab specimens and should not be used as a sole basis for treatment. Nasal washings and aspirates are unacceptable for Xpert Xpress SARS-CoV-2/FLU/RSV testing.  Fact Sheet for Patients: BloggerCourse.com  Fact Sheet for Healthcare Providers: SeriousBroker.it  This test is not yet approved  or cleared by the [place] and has been authorized for detection and/or diagnosis of SARS-CoV-2 by FDA under an Emergency Use Authorization (EUA). This EUA will remain in effect (meaning this test can be used) for the duration of the COVID-19 declaration under Section 564(b)(1) of the Act, 21 U.S.C. section 360bbb-3(b)(1), unless the authorization is terminated or revoked.     Resp Syncytial Virus by PCR NEGATIVE NEGATIVE    Comment: (NOTE) Fact Sheet for Patients: BloggerCourse.com  Fact Sheet for Healthcare Providers: SeriousBroker.it  This test is not yet approved or cleared by the [place] FDA and has been authorized for detection and/or diagnosis of SARS-CoV-2 by FDA under an Emergency Use Authorization (EUA). This EUA will remain in effect (meaning this test can be used) for the duration of the COVID-19 declaration under Section 564(b)(1) of the Act, 21 U.S.C. section 360bbb-3(b)(1), unless the authorization is terminated or revoked.  Performed at [facility], [address] [place], [place] [address]   Troponin I (High Sensitivity)     Status: None   Collection Time: 02/27/23  3:53 AM  Result Value Ref Range   Troponin I (High Sensitivity) 5 <18 ng/L    Comment: (NOTE) Elevated high sensitivity troponin I (hsTnI) values and significant  changes across serial measurements may suggest ACS but many other  chronic and acute conditions are known to elevate hsTnI results.  Refer to the "Links" section for chest pain algorithms and additional   guidance. Performed at [facility], [address] [place], [place] [address]   Urinalysis, Complete w Microscopic -Urine, Clean Catch     Status: Abnormal   Collection Time: 02/27/23  9:08 AM  Result Value Ref Range   Color, Urine YELLOW (A) YELLOW   APPearance HAZY (A) CLEAR   Specific Gravity, Urine 1.021 1.005 - 1.030   pH 8.0 5.0 - 8.0   Glucose, UA 150 (A) NEGATIVE mg/dL   Hgb urine dipstick NEGATIVE NEGATIVE   Bilirubin Urine NEGATIVE NEGATIVE   Ketones, ur NEGATIVE NEGATIVE mg/dL   Protein, ur NEGATIVE NEGATIVE mg/dL   Nitrite NEGATIVE NEGATIVE   Leukocytes,Ua NEGATIVE NEGATIVE   RBC / HPF 0-5 0 - 5 RBC/hpf   WBC, UA 0-5 0 - 5 WBC/hpf   Bacteria, UA NONE SEEN NONE SEEN   Squamous Epithelial / HPF 0-5 0 - 5 /HPF   Mucus PRESENT     Comment: Performed at [facility], [address] [place], [place] [address]  Chlamydia/NGC rt PCR [facility] only)     Status: None   Collection Time: 02/27/23  9:08 AM   Specimen: Urine, Clean Catch  Result Value Ref Range   Specimen source GC/Chlam URINE, RANDOM    Chlamydia Tr NOT DETECTED NOT DETECTED   N gonorrhoeae NOT DETECTED NOT DETECTED    Comment: (NOTE) This CT/NG assay has not been evaluated in patients with a history of  hysterectomy. Performed at [facility], [address] [place], [place] [address]   Urine Drug Screen, Qualitative     Status: Abnormal   Collection Time: 02/27/23  9:08 AM  Result Value Ref Range   Tricyclic, Ur Screen NONE DETECTED NONE DETECTED   Amphetamines, Ur Screen NONE DETECTED NONE DETECTED   MDMA (Ecstasy)Ur Screen NONE DETECTED NONE DETECTED   Cocaine Metabolite,Ur [place] POSITIVE (A) NONE DETECTED   Opiate, Ur Screen NONE DETECTED NONE DETECTED   Phencyclidine (PCP) Ur S NONE DETECTED NONE DETECTED   Cannabinoid 50 Ng,  Ur [place] NONE DETECTED NONE DETECTED   Barbiturates, Ur Screen NONE DETECTED NONE DETECTED   Benzodiazepine, Ur Scrn POSITIVE (A) NONE DETECTED    Methadone Scn, Ur NONE DETECTED NONE DETECTED    Comment: (NOTE) Tricyclics + metabolites, urine    Cutoff 1000 ng/mL Amphetamines + metabolites, urine  Cutoff 1000 ng/mL MDMA (Ecstasy), urine              Cutoff 500 ng/mL Cocaine Metabolite, urine          Cutoff 300 ng/mL Opiate + metabolites, urine        Cutoff 300 ng/mL Phencyclidine (PCP), urine         Cutoff 25 ng/mL Cannabinoid, urine                 Cutoff 50 ng/mL Barbiturates + metabolites, urine  Cutoff 200 ng/mL Benzodiazepine, urine              Cutoff 200 ng/mL Methadone, urine                   Cutoff 300 ng/mL  The urine drug screen provides only a preliminary, unconfirmed analytical test result and should not be used for non-medical purposes. Clinical consideration and professional judgment should be applied to any positive drug screen result due to possible interfering substances. A more specific alternate chemical method must be used in order to obtain a confirmed analytical result. Gas chromatography / mass spectrometry (GC/MS) is the preferred confirm atory method. Performed at [facility], [address] [place], [place] [address]     Blood Alcohol level:  Lab Results  Component Value Date   [facility] <10 02/27/2023   ETH <10 02/19/2023    Metabolic Disorder Labs:  Lab Results  Component Value Date   HGBA1C 5.9 (A) 07/12/2022   MPG 117 02/02/2009   No results found for: "PROLACTIN" Lab Results  Component Value Date   CHOL 203 (H) 10/31/2022   TRIG 196 (H) 10/31/2022   HDL 84 10/31/2022   CHOLHDL 2.4 10/31/2022   VLDL 39 10/31/2022   LDLCALC 80 10/31/2022   LDLCALC 63 10/08/2014    Current Medications: Current Facility-Administered Medications  Medication Dose Route Frequency Provider Last Rate Last Admin   acetaminophen (TYLENOL) tablet 650 mg  650 mg Oral Q6H PRN Penn, Cicely, NP       albuterol (PROVENTIL) (2.5 MG/3ML) 0.083% nebulizer solution 2.5 mg  2.5 mg Inhalation Q4H PRN  Penn, Cicely, NP       alum & mag hydroxide-simeth (MAALOX/MYLANTA) 200-200-20 MG/5ML suspension 30 mL  30 mL Oral Q4H PRN Penn, Cicely, NP       FLUoxetine (PROZAC) capsule 20 mg  20 mg Oral Daily Penn, Cicely, NP   20 mg at 02/28/23 2956   magnesium hydroxide (MILK OF MAGNESIA) suspension 30 mL  30 mL Oral Daily PRN Penn, Cranston Neighbor, NP       OLANZapine (ZYPREXA) injection 5 mg  5 mg Intramuscular TID PRN Penn, Cranston Neighbor, NP       OLANZapine zydis (ZYPREXA) disintegrating tablet 5 mg  5 mg Oral TID PRN Mcneil Sober, NP       PTA Medications: Medications Prior to Admission  Medication Sig Dispense Refill Last Dose/Taking   albuterol (VENTOLIN HFA) 108 (90 Base) MCG/ACT inhaler Inhale 2 puffs into the lungs every 4 (four) hours as needed for wheezing or shortness of breath. 1 each 0    amitriptyline (ELAVIL) 25 MG tablet Take 25 mg  by mouth at bedtime.      ARIPiprazole (ABILIFY) 5 MG tablet Take 5 mg by mouth daily.      azithromycin (ZITHROMAX) 250 MG tablet Take 1 tablet (250 mg total) by mouth daily. Take 1 tablet by mouth daily, first dose given in the emergency department (Patient not taking: Reported on 02/27/2023) 4 tablet 0    chlordiazePOXIDE (LIBRIUM) 25 MG capsule 50mg  PO TID x 1D, then 25-50mg  PO BID X 1D, then 25-50mg  PO QD X 1D (Patient not taking: Reported on 02/27/2023) 10 capsule 0    DULERA 200-5 MCG/ACT AERO Inhale 2 puffs into the lungs in the morning and at bedtime.      FLUoxetine (PROZAC) 20 MG capsule Take 1 capsule (20 mg total) by mouth daily. 30 capsule 0    IBU 800 MG tablet Take 800 mg by mouth every 6 (six) hours as needed for moderate pain (pain score 4-6).      predniSONE (DELTASONE) 50 MG tablet Take 1 tab by mouth daily (Patient not taking: Reported on 02/27/2023) 4 tablet 0    risperiDONE (RISPERDAL) 1 MG tablet Take 1 tablet (1 mg total) by mouth at bedtime. 30 tablet 0    traZODone (DESYREL) 50 MG tablet Take 1 tablet (50 mg total) by mouth at bedtime as needed for  sleep. 30 tablet 0    VENTOLIN HFA 108 (90 Base) MCG/ACT inhaler Inhale 2 puffs into the lungs every 6 (six) hours as needed for wheezing or shortness of breath. (Patient not taking: Reported on 02/27/2023)      Vitamin D, Ergocalciferol, (DRISDOL) 1.25 MG (50000 UNIT) CAPS capsule Take 50,000 Units by mouth once a week.       Psychiatric Specialty Exam:  Presentation  General Appearance:  Appropriate for Environment  Eye Contact: Good  Speech: Clear and Coherent  Speech Volume: Normal    Mood and Affect  Mood: Euthymic  Affect: Congruent   Thought Process  Thought Processes: Coherent  Descriptions of Associations:Intact  Orientation:Full (Time, Place and Person)  Thought Content:WDL  Hallucinations:none reported Ideas of Reference:None  Suicidal Thoughts:SI with no intent Homicidal Thoughts:none reported  Sensorium  Memory: Immediate Good; Recent Good; Remote Good  Judgment: Fair  Insight: Good   Executive Functions  Concentration: Good  Attention Span: Good  Recall: Good  Fund of Knowledge: Good  Language: Good   Psychomotor Activity  Psychomotor Activity: WNL  Assets  Assets: Desire for Improvement; Physical Health; Social Support    Musculoskeletal: Strength & Muscle Tone: within normal limits Gait & Station: normal  Physical Exam: Physical Exam Vitals and nursing note reviewed.  HENT:     Head: Normocephalic.     Nose: Nose normal.     Mouth/Throat:     Mouth: Mucous membranes are moist.  Eyes:     Pupils: Pupils are equal, round, and reactive to light.  Cardiovascular:     Rate and Rhythm: Normal rate.  Pulmonary:     Breath sounds: Normal breath sounds.  Abdominal:     General: Bowel sounds are normal.  Skin:    General: Skin is warm.  Neurological:     General: No focal deficit present.     Mental Status: He is alert.    Review of Systems  Constitutional: Negative.   HENT: Negative.    Eyes:  Negative.   Respiratory: Negative.    Cardiovascular: Negative.   Gastrointestinal: Negative.   Musculoskeletal: Negative.   Skin: Negative.   Neurological: Negative.  Blood pressure 116/69, pulse 73, temperature 98.4 F (36.9 C), resp. rate 14, height 5\' 10"  (1.778 m), weight 73.5 kg, SpO2 96%. Body mass index is 23.24 kg/m.  Principal Diagnosis: <principal problem not specified> Diagnosis:  Active Problems:   MDD (major depressive disorder), Recurrent severe with out psychosis Vs Substance induced mood disorder Alcohol use disorder  Stimulant use disorder   Clinical Decision Making: Patient with history of depression, polysubstance use presented today for worsening depression, suicidal ideation.  He endorses hopelessness and anhedonia with worsening anxiety.  Given polysubstance use is noted to be impulsive in nature.  He carries a moderate risk for suicide.  Patient is currently admitted to geropsych inpatient unit.  Treatment Plan Summary:  Safety and Monitoring:             -- Voluntary admission to inpatient psychiatric unit for safety, stabilization and treatment             -- Daily contact with patient to assess and evaluate symptoms and progress in treatment             -- Patient's case to be discussed in multi-disciplinary team meeting             -- Observation Level: q15 minute checks             -- Vital signs:  q12 hours             -- Precautions: suicide, elopement, and assault   2. Psychiatric Diagnoses and Treatment:              Patient reports being on her Risperdal and Prozac Patient is not currently psychotic so will hold off Risperdal at this time Patient gave consent to restart his home Prozac 20 mg daily.     -- The risks/benefits/side-effects/alternatives to this medication were discussed in detail with the patient and time was given for questions. The patient consents to medication trial.                -- Metabolic profile and EKG monitoring  obtained while on an atypical antipsychotic (BMI: Lipid Panel: HbgA1c: QTc:)              -- Encouraged patient to participate in unit milieu and in scheduled group therapies                            3. Medical Issues Being Addressed:  No medical emergent needs noted   4. Discharge Planning:              -- Social work and case management to assist with discharge planning and identification of hospital follow-up needs prior to discharge             -- Estimated LOS: 5-7 days             -- Discharge Concerns: Need to establish a safety plan; Medication compliance and effectiveness             -- Discharge Goals: Return home with outpatient referrals follow ups  Physician Treatment Plan for Primary Diagnosis: <principal problem not specified> Long Term Goal(s): Improvement in symptoms so as ready for discharge  Short Term Goals: Ability to identify changes in lifestyle to reduce recurrence of condition will improve, Ability to verbalize feelings will improve, Ability to disclose and discuss suicidal ideas, Ability to demonstrate self-control will improve, Ability to identify and develop effective coping behaviors will  improve, Ability to maintain clinical measurements within normal limits will improve, and Compliance with prescribed medications will improve  Physician Treatment Plan for Secondary Diagnosis: Active Problems:   MDD (major depressive disorder)  Long Term Goal(s): Improvement in symptoms so as ready for discharge  Short Term Goals: Ability to identify changes in lifestyle to reduce recurrence of condition will improve, Ability to verbalize feelings will improve, Ability to disclose and discuss suicidal ideas, Ability to demonstrate self-control will improve, Ability to identify and develop effective coping behaviors will improve, Ability to maintain clinical measurements within normal limits will improve, Compliance with prescribed medications will improve, and Ability to  identify triggers associated with substance abuse/mental health issues will improve  I certify that inpatient services furnished can reasonably be expected to improve the patient's condition.    Verner Chol, MD 2/24/20251:51 PM

## 2023-02-28 NOTE — Group Note (Signed)
 Date:  02/28/2023 Time:  9:35 PM  Group Topic/Focus:  Wrap-Up Group:   The focus of this group is to help patients review their daily goal of treatment and discuss progress on daily workbooks.    Participation Level:  Minimal  Participation Quality:  Appropriate  Affect:  Appropriate  Cognitive:  Appropriate  Insight: Good  Engagement in Group:  Limited  Modes of Intervention:  Discussion  Additional Comments:    Maeola Harman 02/28/2023, 9:35 PM

## 2023-02-28 NOTE — BH IP Treatment Plan (Addendum)
 Interdisciplinary Treatment and Diagnostic Plan Update  02/28/2023 Time of Session: 9:43 AM  Mykel Sponaugle MRN: [id number]  Principal Diagnosis: <principal problem not specified>  Secondary Diagnoses: Active Problems:   MDD (major depressive disorder)   Current Medications:  Current Facility-Administered Medications  Medication Dose Route Frequency Provider Last Rate Last Admin   acetaminophen (TYLENOL) tablet 650 mg  650 mg Oral Q6H PRN Penn, Cicely, NP       albuterol (PROVENTIL) (2.5 MG/3ML) 0.083% nebulizer solution 2.5 mg  2.5 mg Inhalation Q4H PRN Penn, Cicely, NP       alum & mag hydroxide-simeth (MAALOX/MYLANTA) 200-200-20 MG/5ML suspension 30 mL  30 mL Oral Q4H PRN Penn, Cicely, NP       FLUoxetine (PROZAC) capsule 20 mg  20 mg Oral Daily Penn, Cicely, NP       magnesium hydroxide (MILK OF MAGNESIA) suspension 30 mL  30 mL Oral Daily PRN Penn, Cicely, NP       OLANZapine (ZYPREXA) injection 5 mg  5 mg Intramuscular TID PRN Penn, Cicely, NP       OLANZapine zydis (ZYPREXA) disintegrating tablet 5 mg  5 mg Oral TID PRN Mcneil Sober, NP       PTA Medications: Medications Prior to Admission  Medication Sig Dispense Refill Last Dose/Taking   albuterol (VENTOLIN HFA) 108 (90 Base) MCG/ACT inhaler Inhale 2 puffs into the lungs every 4 (four) hours as needed for wheezing or shortness of breath. 1 each 0    amitriptyline (ELAVIL) 25 MG tablet Take 25 mg by mouth at bedtime.      ARIPiprazole (ABILIFY) 5 MG tablet Take 5 mg by mouth daily.      azithromycin (ZITHROMAX) 250 MG tablet Take 1 tablet (250 mg total) by mouth daily. Take 1 tablet by mouth daily, first dose given in the emergency department (Patient not taking: Reported on 02/27/2023) 4 tablet 0    chlordiazePOXIDE (LIBRIUM) 25 MG capsule 50mg  PO TID x 1D, then 25-50mg  PO BID X 1D, then 25-50mg  PO QD X 1D (Patient not taking: Reported on 02/27/2023) 10 capsule 0    DULERA 200-5 MCG/ACT AERO Inhale 2 puffs into the lungs in the  morning and at bedtime.      FLUoxetine (PROZAC) 20 MG capsule Take 1 capsule (20 mg total) by mouth daily. 30 capsule 0    IBU 800 MG tablet Take 800 mg by mouth every 6 (six) hours as needed for moderate pain (pain score 4-6).      predniSONE (DELTASONE) 50 MG tablet Take 1 tab by mouth daily (Patient not taking: Reported on 02/27/2023) 4 tablet 0    risperiDONE (RISPERDAL) 1 MG tablet Take 1 tablet (1 mg total) by mouth at bedtime. 30 tablet 0    traZODone (DESYREL) 50 MG tablet Take 1 tablet (50 mg total) by mouth at bedtime as needed for sleep. 30 tablet 0    VENTOLIN HFA 108 (90 Base) MCG/ACT inhaler Inhale 2 puffs into the lungs every 6 (six) hours as needed for wheezing or shortness of breath. (Patient not taking: Reported on 02/27/2023)      Vitamin D, Ergocalciferol, (DRISDOL) 1.25 MG (50000 UNIT) CAPS capsule Take 50,000 Units by mouth once a week.       Patient Stressors:    Patient Strengths:    Treatment Modalities: Medication Management, Group therapy, Case management,  1 to 1 session with clinician, Psychoeducation, Recreational therapy.   Physician Treatment Plan for Primary Diagnosis: <principal problem not specified> Long  Term Goal(s):     Short Term Goals:    Medication Management: Evaluate patient's response, side effects, and tolerance of medication regimen.  Therapeutic Interventions: 1 to 1 sessions, Unit Group sessions and Medication administration.  Evaluation of Outcomes: Not Progressing  Physician Treatment Plan for Secondary Diagnosis: Active Problems:   MDD (major depressive disorder)  Long Term Goal(s):     Short Term Goals:       Medication Management: Evaluate patient's response, side effects, and tolerance of medication regimen.  Therapeutic Interventions: 1 to 1 sessions, Unit Group sessions and Medication administration.  Evaluation of Outcomes: Not Progressing   RN Treatment Plan for Primary Diagnosis: <principal problem not  specified> Long Term Goal(s): Knowledge of disease and therapeutic regimen to maintain health will improve  Short Term Goals: Ability to remain free from injury will improve, Ability to verbalize frustration and anger appropriately will improve, Ability to demonstrate self-control, Ability to participate in decision making will improve, Ability to verbalize feelings will improve, Ability to disclose and discuss suicidal ideas, Ability to identify and develop effective coping behaviors will improve, and Compliance with prescribed medications will improve  Medication Management: RN will administer medications as ordered by provider, will assess and evaluate patient's response and provide education to patient for prescribed medication. RN will report any adverse and/or side effects to prescribing provider.  Therapeutic Interventions: 1 on 1 counseling sessions, Psychoeducation, Medication administration, Evaluate responses to treatment, Monitor vital signs and CBGs as ordered, Perform/monitor CIWA, COWS, AIMS and Fall Risk screenings as ordered, Perform wound care treatments as ordered.  Evaluation of Outcomes: Not Progressing   LCSW Treatment Plan for Primary Diagnosis: <principal problem not specified> Long Term Goal(s): Safe transition to appropriate next level of care at discharge, Engage patient in therapeutic group addressing interpersonal concerns.  Short Term Goals: Engage patient in aftercare planning with referrals and resources, Increase social support, Increase ability to appropriately verbalize feelings, Increase emotional regulation, Facilitate acceptance of mental health diagnosis and concerns, Facilitate patient progression through stages of change regarding substance use diagnoses and concerns, Identify triggers associated with mental health/substance abuse issues, and Increase skills for wellness and recovery  Therapeutic Interventions: Assess for all discharge needs, 1 to 1 time with  Social worker, Explore available resources and support systems, Assess for adequacy in community support network, Educate family and significant other(s) on suicide prevention, Complete Psychosocial Assessment, Interpersonal group therapy.  Evaluation of Outcomes: Not Progressing   Progress in Treatment: Attending groups: No. Participating in groups: No. Taking medication as prescribed: Yes. Toleration medication: Yes. Family/Significant other contact made: No, will contact:  CSW will contact if given permission  Patient understands diagnosis: Yes. Discussing patient identified problems/goals with staff: Yes. Medical problems stabilized or resolved: Yes. Denies suicidal/homicidal ideation: Yes. Issues/concerns per patient self-inventory: No. Other: None   New problem(s) identified: No, Describe:  None identified   New Short Term/Long Term Goal(s): detox, elimination of symptoms of psychosis, medication management for mood stabilization; elimination of SI thoughts; development of comprehensive mental wellness/sobriety plan.   Patient Goals:  "Get back on my medici cation, get really balanced, get detoxed, get back to treatment"   Discharge Plan or Barriers: CSW will assist with appropriate discharge planning   Reason for Continuation of Hospitalization: Depression Medical Issues Medication stabilization  Estimated Length of Stay: 1 to 7 days   Last 3 [place] Suicide Severity Risk Score: Flowsheet Row Admission (Current) from 02/27/2023 in [facility] Most recent reading at 02/27/2023  3:00 PM ED from 02/27/2023 in [facility] Emergency Department at [facility] Most recent reading at 02/27/2023  1:59 AM ED from 02/18/2023 in [facility] Emergency Department at [facility] Most recent reading at 02/18/2023 11:59 PM  C-SSRS RISK CATEGORY Low Risk No Risk No Risk       Last PHQ 2/9 Scores:    12/09/2022    8:03 AM 12/06/2022    8:56 AM 12/05/2022     5:22 PM  Depression screen PHQ 2/9  Decreased Interest 0 3 3  Down, Depressed, Hopeless 0 3 3  PHQ - 2 Score 0 6 6  Altered sleeping 0  3  Tired, decreased energy 0  3  Change in appetite 0  3  Feeling bad or failure about yourself  0  3  Trouble concentrating 0  3  Moving slowly or fidgety/restless   3  Suicidal thoughts 0  3  PHQ-9 Score 0  27  Difficult doing work/chores   Extremely dIfficult    Scribe for Treatment Team: Elza Rafter, Theresia Majors 02/28/2023 9:55 AM

## 2023-03-01 NOTE — BHH Counselor (Signed)
 Adult Comprehensive Assessment  Patient ID: Franklin [name], male   DOB: 1970/05/10, 53 y.o.   MRN: [id number]  Information Source: Information source: Patient  Current Stressors:  Patient states their primary concerns and needs for treatment are:: "Getting back on my meds" Patient states their goals for this hospitilization and ongoing recovery are:: "Get back on my meds, getting back to treatment" Educational / Learning stressors: None reported Employment / Job issues: None reported Family Relationships: None reported Surveyor, quantity / Lack of resources (include bankruptcy): Pt reports he signed up for disability and hasn't heard anything back Housing / Lack of housing: Pt reports he has been homeless, and that he's living on the streets Physical health (include injuries & life threatening diseases): Pt reports he has COPD Social relationships: Pt reports he has no friends Substance abuse: Pt reports he uses alcohol and crack Bereavement / Loss: None reported  Living/Environment/Situation:  Living Arrangements: Other (Comment) (Pt reports he is homeless) Living conditions (as described by patient or guardian): Pt reports that he is homeless Who else lives in the home?: Pt does not report How long has patient lived in current situation?: Pt reports over 4 months What is atmosphere in current home: Dangerous, Temporary  Family History:  Marital status: Single Are you sexually active?: No What is your sexual orientation?: "straight" Has your sexual activity been affected by drugs, alcohol, medication, or emotional stress?: Pt denies. Does patient have children?: No  Childhood History:  By whom was/is the patient raised?: Both parents Additional childhood history information: Patient reports have a good childhood Description of patient's relationship with caregiver when they were a child: "good" Patient's description of current relationship with people who raised him/her: Pt does not  report How were you disciplined when you got in trouble as a child/adolescent?: "spanking" Does patient have siblings?: Yes Number of Siblings: 8 Description of patient's current relationship with siblings: Pt reports no relationship Did patient suffer any verbal/emotional/physical/sexual abuse as a child?: No Did patient suffer from severe childhood neglect?: No Has patient ever been sexually abused/assaulted/raped as an adolescent or adult?: No Was the patient ever a victim of a crime or a disaster?: No Witnessed domestic violence?: No Has patient been affected by domestic violence as an adult?: No  Education:  Highest grade of school patient has completed: 2 years of college Currently a Consulting civil engineer?: No Learning disability?: No  Employment/Work Situation:   Employment Situation: Unemployed Patient's Job has Been Impacted by Current Illness: No What is the Longest Time Patient has Held a Job?: 2 years Where was the Patient Employed at that Time?: "[employer]" Has Patient ever Been in the [employer]?: No  Financial Resources:   Surveyor, quantity resources: Sales executive, Medicaid Does patient have a Lawyer or guardian?: No  Alcohol/Substance Abuse:   What has been your use of drugs/alcohol within the last 12 months?: Pt reports he drinks 12 beers a day and uses 1 gram of crack daily If attempted suicide, did drugs/alcohol play a role in this?: Yes Alcohol/Substance Abuse Treatment Hx: Past detox, Past Tx, Inpatient If yes, describe treatment: Pt reports he has been to multiple substance use trearment programs throughout the years Has alcohol/substance abuse ever caused legal problems?: No  Social Support System:   Describe Community Support System: Pt reports none Type of faith/religion: Pt reports none How does patient's faith help to cope with current illness?: N/A  Leisure/Recreation:   Do You Have Hobbies?: No  Strengths/Needs:   What is the patient's perception of  their  strengths?: Pt reports none Patient states they can use these personal strengths during their treatment to contribute to their recovery: None reported Patient states these barriers may affect/interfere with their treatment: None reported Patient states these barriers may affect their return to the community: None reported Other important information patient would like considered in planning for their treatment: None reported  Discharge Plan:   Currently receiving community mental health services: No Patient states concerns and preferences for aftercare planning are: Pt reports they would like to go to substance use treatment Patient states they will know when they are safe and ready for discharge when: Pt shrugged his shoulders and shook his head Does patient have access to transportation?: No Does patient have financial barriers related to discharge medications?: No Patient description of barriers related to discharge medications: N/A Plan for no access to transportation at discharge: CSW will assist with transportation Will patient be returning to same living situation after discharge?: Yes  Summary/Recommendations:   Summary and Recommendations (to be completed by the evaluator): Patient is a 53 year old male from [place], [place] [facility] [place] emergency department on 02/27/2023 2:25 AM for suicidal ideations. Patient reports going to take some pills or either cut my wrist. Patient states I took some pills a few days ago but I guess it was not enough. He has a psychiatric history of major depressive disorder, suicidal ideation, cocaine use disorder, alcohol use disorder, tobacco abuse, and generalized anxiety disorder . Pt reports he is working on getting disability for his COPD. Pt reports he has been to rehab in the past and would like to go back, either [facility] or [facility] is preferred. Patient's primary diagnosis is Major Depressive Disorder. Recommendations  include: crisis stabilization, therapeutic milieu, encourage group attendance and participation, medication management for mood stabilization and development of comprehensive mental wellness/sobriety plan.  Elza Rafter. 03/01/2023

## 2023-03-01 NOTE — Progress Notes (Signed)
 [facility] MD Progress Note  03/01/2023 1:33 PM Franklin [name]  MRN:  [id number]  Franklin [name] is a 53 y.o. male presenting to [facility] emergency department on 02/27/2023 2:25 AM for suicidal ideations. Patient reports going to take some pills or either cut my wrist. Patient states I took some pills a few days ago but I guess it was not enough. He has a psychiatric history of major depressive disorder, suicidal ideation, cocaine use disorder, alcohol use disorder, tobacco abuse, and generalized anxiety disorder .  Subjective:  Chart reviewed, case discussed in multidisciplinary meeting, patient seen during rounds.  Patient is noted to be resting in his bed.  He offers no complaints.  He reports that his depression is 8 out of 10, 10 being the worst and anxiety as 8 out of 10.  He denies active suicidal thoughts and states that he feels safe being on the inpatient unit.  He denies HI/intent/plan.  He denies auditory/visual hallucinations.  His past waiting in groups.  He reports fair appetite and sleep.  Sleep: Fair  Appetite:  Fair  Past Psychiatric History: see h&P Family History:  Family History  Problem Relation Age of Onset   Hypertension Other    Diabetes Other    Social History:  Social History   Substance and Sexual Activity  Alcohol Use Yes   Alcohol/week: 12.0 standard drinks of alcohol   Types: 12 Cans of beer per week     Social History   Substance and Sexual Activity  Drug Use Yes   Types: "Crack" cocaine, Cocaine   Comment: one gram daily    Social History   Socioeconomic History   Marital status: Single    Spouse name: Not on file   Number of children: Not on file   Years of education: Not on file   Highest education level: Not on file  Occupational History   Occupation: [employer]  Tobacco Use   Smoking status: Some Days    Current packs/day: 0.50    Types: Cigarettes   Smokeless tobacco: Never  Vaping Use   Vaping status: Never Used   Substance and Sexual Activity   Alcohol use: Yes    Alcohol/week: 12.0 standard drinks of alcohol    Types: 12 Cans of beer per week   Drug use: Yes    Types: "Crack" cocaine, Cocaine    Comment: one gram daily   Sexual activity: Yes  Other Topics Concern   Not on file  Social History Narrative   The patient was born and raised in with that by both his biological parents. His father's past way but his mother still living. He denies any history of any physical or sexual abuse. He says he completed 2 years of college at [employer]. 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 He has never been married but has a 72 year old son who lives with his mother. He says he does get to see his son and has a relationship with him. He is not currently dating or in a relationship.      The patient does have a history of a DUI and had a court date last Friday for a DUI. He denies any other pending charges.            Social Drivers of Corporate investment banker Strain: Not on file  Food Insecurity: Food Insecurity Present (02/27/2023)   Hunger Vital  Sign    Worried About Programme researcher, broadcasting/film/video in the Last Year: Sometimes true    Ran Out of Food in the Last Year: Sometimes true  Transportation Needs: Unmet Transportation Needs (02/27/2023)   PRAPARE - Administrator, Civil Service (Medical): Yes    Lack of Transportation (Non-Medical): Yes  Physical Activity: Not on file  Stress: Not on file  Social Connections: Patient Declined (02/27/2023)   Social Connection and Isolation Panel [NHANES]    Frequency of Communication with Friends and Family: Patient declined    Frequency of Social Gatherings with Friends and Family: Patient declined    Attends Religious Services: Patient declined    Database administrator or Organizations: Patient declined    Attends Engineer, structural: Patient declined    Marital  Status: Patient declined   Past Medical History:  Past Medical History:  Diagnosis Date   Anxiety    COPD (chronic obstructive pulmonary disease) (HCC)    Depression    Substance abuse (HCC)     Past Surgical History:  Procedure Laterality Date   NO PAST SURGERIES      Current Medications: Current Facility-Administered Medications  Medication Dose Route Frequency Provider Last Rate Last Admin   acetaminophen (TYLENOL) tablet 650 mg  650 mg Oral Q6H PRN Penn, Cicely, NP       albuterol (PROVENTIL) (2.5 MG/3ML) 0.083% nebulizer solution 2.5 mg  2.5 mg Inhalation Q4H PRN Penn, Cicely, NP       alum & mag hydroxide-simeth (MAALOX/MYLANTA) 200-200-20 MG/5ML suspension 30 mL  30 mL Oral Q4H PRN Penn, Cicely, NP       FLUoxetine (PROZAC) capsule 20 mg  20 mg Oral Daily Penn, Cicely, NP   20 mg at 03/01/23 0909   magnesium hydroxide (MILK OF MAGNESIA) suspension 30 mL  30 mL Oral Daily PRN Penn, Cranston Neighbor, NP       OLANZapine (ZYPREXA) injection 5 mg  5 mg Intramuscular TID PRN Penn, Cicely, NP       OLANZapine zydis (ZYPREXA) disintegrating tablet 5 mg  5 mg Oral TID PRN Mcneil Sober, NP       [START ON 03/04/2023] Vitamin D (Ergocalciferol) (DRISDOL) 1.25 MG (50000 UNIT) capsule 50,000 Units  50,000 Units Oral Weekly Verner Chol, MD        Lab Results: No results found for this or any previous visit (from the past 48 hours).  Blood Alcohol level:  Lab Results  Component Value Date   ETH <10 02/27/2023   ETH <10 02/19/2023    Metabolic Disorder Labs: Lab Results  Component Value Date   HGBA1C 5.9 (A) 07/12/2022   MPG 117 02/02/2009   No results found for: "PROLACTIN" Lab Results  Component Value Date   CHOL 203 (H) 10/31/2022   TRIG 196 (H) 10/31/2022   HDL 84 10/31/2022   CHOLHDL 2.4 10/31/2022   VLDL 39 10/31/2022   LDLCALC 80 10/31/2022   LDLCALC 63 10/08/2014    Physical Findings: AIMS:  , ,  ,  ,    CIWA:    COWS:      Psychiatric Specialty  Exam:  Presentation  General Appearance:  Appropriate for Environment; Casual  Eye Contact: Fair  Speech: Clear and Coherent  Speech Volume: Normal    Mood and Affect  Mood: Depressed; Anxious  Affect: Depressed; Flat   Thought Process  Thought Processes: Coherent  Descriptions of Associations:Intact  Orientation:Full (Time, Place and Person)  Thought Content:Logical  Hallucinations:Hallucinations: None  Ideas of Reference:None  Suicidal Thoughts:Suicidal Thoughts: No  Homicidal Thoughts:Homicidal Thoughts: No   Sensorium  Memory: Immediate Fair; Recent Fair; Remote Fair  Judgment: Impaired  Insight: Shallow   Executive Functions  Concentration: Fair  Attention Span: Fair  Recall: Fair  Fund of Knowledge: Fair  Language: Fair   Psychomotor Activity  Psychomotor Activity: Psychomotor Activity: Normal  Musculoskeletal: Strength & Muscle Tone: within normal limits Gait & Station: normal Assets  Assets: Manufacturing systems engineer; Financial Resources/Insurance; Resilience    Physical Exam: Physical Exam ROS Blood pressure 129/66, pulse 76, temperature 98.5 F (36.9 C), resp. rate 14, height 5\' 10"  (1.778 m), weight 73.5 kg, SpO2 97%. Body mass index is 23.24 kg/m.  MDD (major depressive disorder), Recurrent severe with out psychosis Vs Substance induced mood disorder Alcohol use disorder  Stimulant use disorder     Clinical Decision Making: Patient with history of depression, polysubstance use presented today for worsening depression, suicidal ideation.  He endorses hopelessness and anhedonia with worsening anxiety.  Given polysubstance use is noted to be impulsive in nature.  He carries a moderate risk for suicide.  Patient is currently admitted to geropsych inpatient unit.   Treatment Plan Summary:   Safety and Monitoring:             -- Voluntary admission to inpatient psychiatric unit for safety, stabilization and  treatment             -- Daily contact with patient to assess and evaluate symptoms and progress in treatment             -- Patient's case to be discussed in multi-disciplinary team meeting             -- Observation Level: q15 minute checks             -- Vital signs:  q12 hours             -- Precautions: suicide, elopement, and assault   2. Psychiatric Diagnoses and Treatment:              Patient reports being on her Risperdal and Prozac Patient is not currently psychotic so will hold off Risperdal at this time Patient gave consent to restart his home Prozac 20 mg daily.     -- The risks/benefits/side-effects/alternatives to this medication were discussed in detail with the patient and time was given for questions. The patient consents to medication trial.                -- Metabolic profile and EKG monitoring obtained while on an atypical antipsychotic (BMI: Lipid Panel: HbgA1c: QTc:)              -- Encouraged patient to participate in unit milieu and in scheduled group therapies                            3. Medical Issues Being Addressed:  No medical emergent needs noted   4. Discharge Planning:              -- Social work and case management to assist with discharge planning and identification of hospital follow-up needs prior to discharge             -- Estimated LOS: 5-7 days             -- Discharge Concerns: Need to establish a safety plan; Medication compliance and effectiveness             --  Discharge Goals: Return home with outpatient referrals follow ups    Long Term Goal(s): Improvement in symptoms so as ready for discharge   Short Term Goals: Ability to identify changes in lifestyle to reduce recurrence of condition will improve, Ability to verbalize feelings will improve, Ability to disclose and discuss suicidal ideas, Ability to demonstrate self-control will improve, Ability to identify and develop effective coping behaviors will improve, Ability to maintain  clinical measurements within normal limits will improve, Compliance with prescribed medications will improve, and Ability to identify triggers associated with substance abuse/mental health issues will improve  Verner Chol, MD 03/01/2023, 1:33 PM

## 2023-03-01 NOTE — Progress Notes (Signed)
   03/01/23 2000  Psych Admission Type (Psych Patients Only)  Admission Status Voluntary  Psychosocial Assessment  Eye Contact Avoids  Facial Expression Anxious;Animated  Affect Anxious  Speech Logical/coherent  Interaction Assertive  Motor Activity Restless  Appearance/Hygiene Disheveled;Poor hygiene  Behavior Characteristics Cooperative  Mood Depressed  Thought Process  Coherency WDL  Content WDL  Delusions None reported or observed  Perception WDL  Hallucination None reported or observed  Judgment Poor  Confusion None  Danger to Self  Current suicidal ideation? Denies  Agreement Not to Harm Self Yes  Description of Agreement verbal  Danger to Others  Danger to Others None reported or observed

## 2023-03-01 NOTE — Plan of Care (Signed)

## 2023-03-01 NOTE — Progress Notes (Signed)
 Patient denies having tremors/no tremors felt, denies tactile disturbances, paroxysmal sweats, nausea, AVH, headache, and agitation.

## 2023-03-01 NOTE — Progress Notes (Signed)
   03/01/23 1300  Psych Admission Type (Psych Patients Only)  Admission Status Voluntary  Psychosocial Assessment  Patient Complaints Depression  Eye Contact Avoids  Facial Expression Animated  Affect Anxious  Speech Logical/coherent  Interaction Assertive  Motor Activity Fidgety  Appearance/Hygiene In scrubs  Behavior Characteristics Cooperative  Mood Depressed  Thought Process  Coherency WDL  Content WDL  Delusions None reported or observed  Perception WDL  Hallucination None reported or observed  Judgment Poor  Confusion None  Danger to Self  Current suicidal ideation? Denies  Agreement Not to Harm Self Yes  Description of Agreement verbal  Danger to Others  Danger to Others None reported or observed

## 2023-03-01 NOTE — Group Note (Signed)
 Recreation Therapy Group Note   Group Topic:Communication  Group Date: 03/01/2023 Start Time: 1330 End Time: 1430 Facilitators: Rosina Lowenstein, LRT, CTRS Location: Courtyard  Group Description: Emotional Check in. Patient sat and talked with LRT about how they are doing and whatever else is on their mind. LRT provided active listening, reassurance and encouragement. Pts were given the opportunity to listen to music or color mandalas while they talk.    Goal Area(s) Addressed: Patient will engage in conversation with LRT. Patient will communicate their wants, needs, or questions.  Patient will practice a new coping skill of "talking to someone".   Affect/Mood: N/A   Participation Level: Did not attend    Clinical Observations/Individualized Feedback: Patient did not attend group.   Plan: Continue to engage patient in RT group sessions 2-3x/week.   Rosina Lowenstein, LRT, CTRS 03/01/2023 4:29 PM

## 2023-03-01 NOTE — BHH Counselor (Signed)
 CSW provided pt numbers for Franklin [name], [employer] [facility] [employer] per his request.   Franklin [name], MSW, [facility] 03/01/2023 10:59 AM

## 2023-03-01 NOTE — Plan of Care (Signed)
   Problem: Education: Goal: Knowledge of General Education information will improve Description Including pain rating scale, medication(s)/side effects and non-pharmacologic comfort measures Outcome: Progressing   Problem: Health Behavior/Discharge Planning: Goal: Ability to manage health-related needs will improve Outcome: Progressing

## 2023-03-01 NOTE — Group Note (Signed)
 Date:  03/01/2023 Time:  9:37 PM  Group Topic/Focus:  Wrap-Up Group:   The focus of this group is to help patients review their daily goal of treatment and discuss progress on daily workbooks.    Participation Level:  Did Not Attend  Participation Quality:      Affect:      Cognitive:      Insight: None  Engagement in Group:  None  Modes of Intervention:      Additional Comments:    Maeola Harman 03/01/2023, 9:37 PM

## 2023-03-02 MED ORDER — FLUOXETINE HCL 10 MG PO CAPS
10.0000 mg | ORAL_CAPSULE | Freq: Once | ORAL | Status: AC
  Administered 2023-03-02: 10 mg via ORAL

## 2023-03-02 MED ORDER — FLUOXETINE HCL 20 MG PO CAPS
30.0000 mg | ORAL_CAPSULE | Freq: Every day | ORAL | Status: DC
  Administered 2023-03-03 – 2023-03-15 (×13): 30 mg via ORAL
  Filled 2023-03-02 (×13): qty 1

## 2023-03-02 NOTE — BHH Counselor (Signed)
 CSW contacted [employer] per pt's request at [phone].   CSW spoke with Clydie Braun at [facility].   She reports pt has been assigned to an outside care management agency called [employer] [phone]).   CSW will inform pt.   Reference ID# [id number]  Reynaldo Minium, MSW, [facility] 03/02/2023 2:07 PM

## 2023-03-02 NOTE — Plan of Care (Signed)
  Problem: Clinical Measurements: Goal: Ability to maintain clinical measurements within normal limits will improve Outcome: Progressing   Problem: Clinical Measurements: Goal: Ability to maintain clinical measurements within normal limits will improve Outcome: Progressing   

## 2023-03-02 NOTE — Group Note (Signed)
 Date:  03/02/2023 Time:  8:58 PM  Group Topic/Focus:  Wrap-Up Group:   The focus of this group is to help patients review their daily goal of treatment and discuss progress on daily workbooks.    Participation Level:  Did Not Attend   [name] 03/02/2023, 8:58 PM

## 2023-03-02 NOTE — B[facility] Counselor (Signed)
 CSW contacted the [employer] [phone]) per pt's request to find out about disability application.   CSW unable to reach, CSW remained on hold for 1 hour.   CSW will speak with pt as pt may need to complete this on an outpatient basis.   Reynaldo Minium, MSW, [place] 03/03/2023 9:47 AM

## 2023-03-02 NOTE — Plan of Care (Signed)

## 2023-03-02 NOTE — Plan of Care (Signed)
   Problem: Health Behavior/Discharge Planning: Goal: Ability to manage health-related needs will improve Outcome: Progressing

## 2023-03-02 NOTE — Progress Notes (Signed)
 [facility] MD Progress Note  03/02/2023 9:55 AM Franklin [name]  MRN:  [id number]  Franklin [name] is a 53 y.o. male presenting to [facility] emergency department on 02/27/2023 2:25 AM for suicidal ideations. Patient reports going to take some pills or either cut my wrist. Patient states I took some pills a few days ago but I guess it was not enough. He has a psychiatric history of major depressive disorder, suicidal ideation, cocaine use disorder, alcohol use disorder, tobacco abuse, and generalized anxiety disorder .  Subjective:  Chart reviewed, case discussed in multidisciplinary meeting, patient seen during rounds.  Today on interview patient reports that he is tolerating medications and agreed to increase dose of Prozac today.  He denies auditory/visual hallucinations.  He is not responding to any stimuli and no overt delusions were noted.  He is participating in groups on the unit.  He continues to feel hopeless and depressed.  He denies any active suicidal ideation/intent/plan as he is in the hospital.  He is taking his medications and denies having any side effects.  He has fair appetite and sleep.   Sleep: Fair  Appetite:  Fair  Past Psychiatric History: see h&P Family History:  Family History  Problem Relation Age of Onset   Hypertension Other    Diabetes Other    Social History:  Social History   Substance and Sexual Activity  Alcohol Use Yes   Alcohol/week: 12.0 standard drinks of alcohol   Types: 12 Cans of beer per week     Social History   Substance and Sexual Activity  Drug Use Yes   Types: "Crack" cocaine, Cocaine   Comment: one gram daily    Social History   Socioeconomic History   Marital status: Single    Spouse name: Not on file   Number of children: Not on file   Years of education: Not on file   Highest education level: Not on file  Occupational History   Occupation: [employer]  Tobacco Use   Smoking status: Some Days    Current packs/day:  0.50    Types: Cigarettes   Smokeless tobacco: Never  Vaping Use   Vaping status: Never Used  Substance and Sexual Activity   Alcohol use: Yes    Alcohol/week: 12.0 standard drinks of alcohol    Types: 12 Cans of beer per week   Drug use: Yes    Types: "Crack" cocaine, Cocaine    Comment: one gram daily   Sexual activity: Yes  Other Topics Concern   Not on file  Social History Narrative   The patient was born and raised in with that by both his biological parents. His father's past way but his mother still living. He denies any history of any physical or sexual abuse. He says he completed 2 years of college at [employer]. 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 He has never been married but has a 3 year old son who lives with his mother. He says he does get to see his son and has a relationship with him. He is not currently dating or in a relationship.      The patient does have a history of a DUI and had a court date last Friday for a DUI. He denies any other pending charges.            Social Drivers of Corporate investment banker Strain: Not on  file  Food Insecurity: Food Insecurity Present (02/27/2023)   Hunger Vital Sign    Worried About Running Out of Food in the Last Year: Sometimes true    Ran Out of Food in the Last Year: Sometimes true  Transportation Needs: Unmet Transportation Needs (02/27/2023)   PRAPARE - Administrator, Civil Service (Medical): Yes    Lack of Transportation (Non-Medical): Yes  Physical Activity: Not on file  Stress: Not on file  Social Connections: Patient Declined (02/27/2023)   Social Connection and Isolation Panel [NHANES]    Frequency of Communication with Friends and Family: Patient declined    Frequency of Social Gatherings with Friends and Family: Patient declined    Attends Religious Services: Patient declined    Database administrator or  Organizations: Patient declined    Attends Engineer, structural: Patient declined    Marital Status: Patient declined   Past Medical History:  Past Medical History:  Diagnosis Date   Anxiety    COPD (chronic obstructive pulmonary disease) (HCC)    Depression    Substance abuse (HCC)     Past Surgical History:  Procedure Laterality Date   NO PAST SURGERIES      Current Medications: Current Facility-Administered Medications  Medication Dose Route Frequency Provider Last Rate Last Admin   acetaminophen (TYLENOL) tablet 650 mg  650 mg Oral Q6H PRN Penn, Cicely, NP       albuterol (PROVENTIL) (2.5 MG/3ML) 0.083% nebulizer solution 2.5 mg  2.5 mg Inhalation Q4H PRN Penn, Cicely, NP       alum & mag hydroxide-simeth (MAALOX/MYLANTA) 200-200-20 MG/5ML suspension 30 mL  30 mL Oral Q4H PRN Penn, Cicely, NP       FLUoxetine (PROZAC) capsule 20 mg  20 mg Oral Daily Penn, Cicely, NP   20 mg at 03/02/23 0272   magnesium hydroxide (MILK OF MAGNESIA) suspension 30 mL  30 mL Oral Daily PRN Penn, Cranston Neighbor, NP       OLANZapine (ZYPREXA) injection 5 mg  5 mg Intramuscular TID PRN Penn, Cicely, NP       OLANZapine zydis (ZYPREXA) disintegrating tablet 5 mg  5 mg Oral TID PRN Mcneil Sober, NP       [START ON 03/04/2023] Vitamin D (Ergocalciferol) (DRISDOL) 1.25 MG (50000 UNIT) capsule 50,000 Units  50,000 Units Oral Weekly Verner Chol, MD        Lab Results: No results found for this or any previous visit (from the past 48 hours).  Blood Alcohol level:  Lab Results  Component Value Date   ETH <10 02/27/2023   ETH <10 02/19/2023    Metabolic Disorder Labs: Lab Results  Component Value Date   HGBA1C 5.9 (A) 07/12/2022   MPG 117 02/02/2009   No results found for: "PROLACTIN" Lab Results  Component Value Date   CHOL 203 (H) 10/31/2022   TRIG 196 (H) 10/31/2022   HDL 84 10/31/2022   CHOLHDL 2.4 10/31/2022   VLDL 39 10/31/2022   LDLCALC 80 10/31/2022   LDLCALC 63 10/08/2014     Physical Findings: AIMS:  , ,  ,  ,    CIWA:    COWS:      Psychiatric Specialty Exam:  Presentation  General Appearance:  Appropriate for Environment; Casual  Eye Contact: Fair  Speech: Clear and Coherent  Speech Volume: Normal    Mood and Affect  Mood: Anxious; Depressed  Affect: Depressed   Thought Process  Thought Processes: Coherent  Descriptions of  Associations:Intact  Orientation:Full (Time, Place and Person)  Thought Content:Logical  Hallucinations:Hallucinations: None  Ideas of Reference:None  Suicidal Thoughts:Suicidal Thoughts: No  Homicidal Thoughts:Homicidal Thoughts: No   Sensorium  Memory: Immediate Fair; Recent Fair; Remote Fair  Judgment: Impaired  Insight: Shallow   Executive Functions  Concentration: Poor  Attention Span: Fair  Recall: Fair  Fund of Knowledge: Fair  Language: Fair   Psychomotor Activity  Psychomotor Activity: Psychomotor Activity: Normal  Musculoskeletal: Strength & Muscle Tone: within normal limits Gait & Station: normal Assets  Assets: Manufacturing systems engineer; Desire for Improvement; Physical Health    Physical Exam: Physical Exam Vitals and nursing note reviewed.  HENT:     Head: Normocephalic.     Nose: Nose normal.     Mouth/Throat:     Mouth: Mucous membranes are moist.  Eyes:     Pupils: Pupils are equal, round, and reactive to light.  Cardiovascular:     Rate and Rhythm: Normal rate.  Pulmonary:     Breath sounds: Normal breath sounds.  Abdominal:     General: Bowel sounds are normal.  Skin:    General: Skin is warm.  Neurological:     General: No focal deficit present.     Mental Status: He is alert.    Review of Systems  Constitutional: Negative.   HENT: Negative.    Eyes: Negative.   Respiratory: Negative.    Cardiovascular: Negative.   Gastrointestinal: Negative.   Skin: Negative.   Neurological: Negative.    Blood pressure 116/71, pulse 65,  temperature 98.1 F (36.7 C), resp. rate 18, height 5\' 10"  (1.778 m), weight 73.5 kg, SpO2 94%. Body mass index is 23.24 kg/m.  MDD (major depressive disorder), Recurrent severe with out psychosis Vs Substance induced mood disorder Alcohol use disorder  Stimulant use disorder     Clinical Decision Making: Patient with history of depression, polysubstance use presented today for worsening depression, suicidal ideation.  He endorses hopelessness and anhedonia with worsening anxiety.  Given polysubstance use is noted to be impulsive in nature.  He carries a moderate risk for suicide.  Patient is currently admitted to geropsych inpatient unit.   Treatment Plan Summary:   Safety and Monitoring:             -- Voluntary admission to inpatient psychiatric unit for safety, stabilization and treatment             -- Daily contact with patient to assess and evaluate symptoms and progress in treatment             -- Patient's case to be discussed in multi-disciplinary team meeting             -- Observation Level: q15 minute checks             -- Vital signs:  q12 hours             -- Precautions: suicide, elopement, and assault   2. Psychiatric Diagnoses and Treatment:   Increase Prozac to 30 mg to optimize the treatment for depression.             Patient reports being on her Risperdal and Prozac Patient is not currently psychotic so will hold off Risperdal at this time     -- The risks/benefits/side-effects/alternatives to this medication were discussed in detail with the patient and time was given for questions. The patient consents to medication trial.                --  Metabolic profile and EKG monitoring obtained while on an atypical antipsychotic (BMI: Lipid Panel: HbgA1c: QTc:)              -- Encouraged patient to participate in unit milieu and in scheduled group therapies                            3. Medical Issues Being Addressed:  No medical emergent needs noted   4. Discharge  Planning:              -- Social work and case management to assist with discharge planning and identification of hospital follow-up needs prior to discharge             -- Estimated LOS: 5-7 days             -- Discharge Concerns: Need to establish a safety plan; Medication compliance and effectiveness             -- Discharge Goals: Return home with outpatient referrals follow ups    Long Term Goal(s): Improvement in symptoms so as ready for discharge   Short Term Goals: Ability to identify changes in lifestyle to reduce recurrence of condition will improve, Ability to verbalize feelings will improve, Ability to disclose and discuss suicidal ideas, Ability to demonstrate self-control will improve, Ability to identify and develop effective coping behaviors will improve, Ability to maintain clinical measurements within normal limits will improve, Compliance with prescribed medications will improve, and Ability to identify triggers associated with substance abuse/mental health issues will improve  Verner Chol, MD 03/02/2023, 9:55 AM

## 2023-03-02 NOTE — Progress Notes (Signed)
   03/02/23 1200  Psych Admission Type (Psych Patients Only)  Admission Status Voluntary  Psychosocial Assessment  Patient Complaints Depression  Eye Contact Avoids  Facial Expression Anxious;Animated  Affect Anxious  Speech Logical/coherent  Interaction Assertive  Motor Activity Restless  Appearance/Hygiene In scrubs  Behavior Characteristics Cooperative  Mood Depressed  Thought Process  Coherency WDL  Content WDL  Delusions None reported or observed  Perception WDL  Hallucination None reported or observed  Judgment Poor  Confusion None  Danger to Self  Current suicidal ideation? Denies  Agreement Not to Harm Self Yes  Description of Agreement Verbal  Danger to Others  Danger to Others None reported or observed

## 2023-03-03 DIAGNOSIS — F1994 Other psychoactive substance use, unspecified with psychoactive substance-induced mood disorder: Secondary | ICD-10-CM | POA: Diagnosis not present

## 2023-03-03 NOTE — Progress Notes (Signed)
 [facility] MD Progress Note  03/03/2023 4:13 PM Franklin [name]  MRN:  [id number]  Franklin [name] is a 53 y.o. male presenting to [facility] emergency department on 02/27/2023 2:25 AM for suicidal ideations. Patient reports going to take some pills or either cut my wrist. Patient states I took some pills a few days ago but I guess it was not enough. He has a psychiatric history of major depressive disorder, suicidal ideation, cocaine use disorder, alcohol use disorder, tobacco abuse, and generalized anxiety disorder .  Subjective:  Chart reviewed, case discussed in multidisciplinary meeting, patient seen during rounds.  Per nursing patient remains isolated to and mostly sleeping in his room.  Provider educated him and encouraged him to attend groups and stay in the day area so that his sleep schedule can be managed at night.  Patient acknowledged the recommendations.  He endorses depression and hopelessness but denies SI/HI/plan.  He denies auditory/visual hallucinations.  He reports making phone calls about getting into rehabs.  No behavioral problems on the unit.  Sleep: Fair  Appetite:  Fair  Past Psychiatric History: see h&P Family History:  Family History  Problem Relation Age of Onset   Hypertension Other    Diabetes Other    Social History:  Social History   Substance and Sexual Activity  Alcohol Use Yes   Alcohol/week: 12.0 standard drinks of alcohol   Types: 12 Cans of beer per week     Social History   Substance and Sexual Activity  Drug Use Yes   Types: "Crack" cocaine, Cocaine   Comment: one gram daily    Social History   Socioeconomic History   Marital status: Single    Spouse name: Not on file   Number of children: Not on file   Years of education: Not on file   Highest education level: Not on file  Occupational History   Occupation: [employer]  Tobacco Use   Smoking status: Some Days    Current packs/day: 0.50    Types: Cigarettes   Smokeless  tobacco: Never  Vaping Use   Vaping status: Never Used  Substance and Sexual Activity   Alcohol use: Yes    Alcohol/week: 12.0 standard drinks of alcohol    Types: 12 Cans of beer per week   Drug use: Yes    Types: "Crack" cocaine, Cocaine    Comment: one gram daily   Sexual activity: Yes  Other Topics Concern   Not on file  Social History Narrative   The patient was born and raised in with that by both his biological parents. His father's past way but his mother still living. He denies any history of any physical or sexual abuse. He says he completed 2 years of college at [employer]. 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 He has never been married but has a 49 year old son who lives with his mother. He says he does get to see his son and has a relationship with him. He is not currently dating or in a relationship.      The patient does have a history of a DUI and had a court date last Friday for a DUI. He denies any other pending charges.            Social Drivers of Corporate investment banker Strain: Not on file  Food Insecurity: Food Insecurity Present (02/27/2023)   Hunger Vital Sign  Worried About Programme researcher, broadcasting/film/video in the Last Year: Sometimes true    Ran Out of Food in the Last Year: Sometimes true  Transportation Needs: Unmet Transportation Needs (02/27/2023)   PRAPARE - Administrator, Civil Service (Medical): Yes    Lack of Transportation (Non-Medical): Yes  Physical Activity: Not on file  Stress: Not on file  Social Connections: Patient Declined (02/27/2023)   Social Connection and Isolation Panel [NHANES]    Frequency of Communication with Friends and Family: Patient declined    Frequency of Social Gatherings with Friends and Family: Patient declined    Attends Religious Services: Patient declined    Database administrator or Organizations: Patient declined    Attends Probation officer: Patient declined    Marital Status: Patient declined   Past Medical History:  Past Medical History:  Diagnosis Date   Anxiety    COPD (chronic obstructive pulmonary disease) (HCC)    Depression    Substance abuse (HCC)     Past Surgical History:  Procedure Laterality Date   NO PAST SURGERIES      Current Medications: Current Facility-Administered Medications  Medication Dose Route Frequency Provider Last Rate Last Admin   acetaminophen (TYLENOL) tablet 650 mg  650 mg Oral Q6H PRN Penn, Cicely, NP       albuterol (PROVENTIL) (2.5 MG/3ML) 0.083% nebulizer solution 2.5 mg  2.5 mg Inhalation Q4H PRN Penn, Cicely, NP       alum & mag hydroxide-simeth (MAALOX/MYLANTA) 200-200-20 MG/5ML suspension 30 mL  30 mL Oral Q4H PRN Penn, Cicely, NP       FLUoxetine (PROZAC) capsule 30 mg  30 mg Oral Daily Verner Chol, MD   30 mg at 03/03/23 0840   magnesium hydroxide (MILK OF MAGNESIA) suspension 30 mL  30 mL Oral Daily PRN Penn, Cranston Neighbor, NP       OLANZapine (ZYPREXA) injection 5 mg  5 mg Intramuscular TID PRN Penn, Cicely, NP       OLANZapine zydis (ZYPREXA) disintegrating tablet 5 mg  5 mg Oral TID PRN Mcneil Sober, NP       [START ON 03/04/2023] Vitamin D (Ergocalciferol) (DRISDOL) 1.25 MG (50000 UNIT) capsule 50,000 Units  50,000 Units Oral Weekly Verner Chol, MD        Lab Results: No results found for this or any previous visit (from the past 48 hours).  Blood Alcohol level:  Lab Results  Component Value Date   ETH <10 02/27/2023   ETH <10 02/19/2023    Metabolic Disorder Labs: Lab Results  Component Value Date   HGBA1C 5.9 (A) 07/12/2022   MPG 117 02/02/2009   No results found for: "PROLACTIN" Lab Results  Component Value Date   CHOL 203 (H) 10/31/2022   TRIG 196 (H) 10/31/2022   HDL 84 10/31/2022   CHOLHDL 2.4 10/31/2022   VLDL 39 10/31/2022   LDLCALC 80 10/31/2022   LDLCALC 63 10/08/2014    Physical Findings: AIMS:  , ,  ,  ,    CIWA:     COWS:      Psychiatric Specialty Exam:  Presentation  General Appearance:  Appropriate for Environment; Casual  Eye Contact: Fair  Speech: Clear and Coherent  Speech Volume: Normal    Mood and Affect  Mood: Dysphoric; Hopeless  Affect: Depressed; Flat   Thought Process  Thought Processes: Coherent  Descriptions of Associations:Intact  Orientation:Full (Time, Place and Person)  Thought Content:Logical  Hallucinations:Hallucinations: None  Ideas  of Reference:None  Suicidal Thoughts:Suicidal Thoughts: Yes, Passive SI Passive Intent and/or Plan: Without Intent; Without Plan  Homicidal Thoughts:Homicidal Thoughts: No   Sensorium  Memory: Immediate Fair; Recent Fair; Remote Fair  Judgment: Impaired  Insight: Shallow   Executive Functions  Concentration: Fair  Attention Span: Fair  Recall: Fair  Fund of Knowledge: Fair  Language: Fair   Psychomotor Activity  Psychomotor Activity: Psychomotor Activity: Normal  Musculoskeletal: Strength & Muscle Tone: within normal limits Gait & Station: normal Assets  Assets: Manufacturing systems engineer; Desire for Improvement; Physical Health    Physical Exam: Physical Exam Vitals and nursing note reviewed.  HENT:     Head: Normocephalic.     Nose: Nose normal.     Mouth/Throat:     Mouth: Mucous membranes are moist.  Eyes:     Pupils: Pupils are equal, round, and reactive to light.  Cardiovascular:     Rate and Rhythm: Normal rate.  Pulmonary:     Breath sounds: Normal breath sounds.  Abdominal:     General: Bowel sounds are normal.  Skin:    General: Skin is warm.  Neurological:     General: No focal deficit present.     Mental Status: He is alert.    Review of Systems  Constitutional: Negative.   HENT: Negative.    Eyes: Negative.   Respiratory: Negative.    Cardiovascular: Negative.   Gastrointestinal: Negative.   Skin: Negative.   Neurological: Negative.    Blood  pressure 119/75, pulse 65, temperature 98.4 F (36.9 C), resp. rate 17, height 5\' 10"  (1.778 m), weight 73.5 kg, SpO2 96%. Body mass index is 23.24 kg/m.  MDD (major depressive disorder), Recurrent severe with out psychosis Vs Substance induced mood disorder Alcohol use disorder  Stimulant use disorder     Clinical Decision Making: Patient with history of depression, polysubstance use presented today for worsening depression, suicidal ideation.  He endorses hopelessness and anhedonia with worsening anxiety.  Given polysubstance use is noted to be impulsive in nature.  He carries a moderate risk for suicide.  Patient is currently admitted to geropsych inpatient unit.   Treatment Plan Summary:   Safety and Monitoring:             -- Voluntary admission to inpatient psychiatric unit for safety, stabilization and treatment             -- Daily contact with patient to assess and evaluate symptoms and progress in treatment             -- Patient's case to be discussed in multi-disciplinary team meeting             -- Observation Level: q15 minute checks             -- Vital signs:  q12 hours             -- Precautions: suicide, elopement, and assault   2. Psychiatric Diagnoses and Treatment:    Prozac 30 mg to optimize the treatment for depression.             Patient reports being on her Risperdal and Prozac Patient is not currently psychotic so will hold off Risperdal at this time     -- The risks/benefits/side-effects/alternatives to this medication were discussed in detail with the patient and time was given for questions. The patient consents to medication trial.                -- Metabolic profile and  EKG monitoring obtained while on an atypical antipsychotic (BMI: Lipid Panel: HbgA1c: QTc:)              -- Encouraged patient to participate in unit milieu and in scheduled group therapies                            3. Medical Issues Being Addressed:  No medical emergent needs  noted   4. Discharge Planning:              -- Social work and case management to assist with discharge planning and identification of hospital follow-up needs prior to discharge             -- Estimated LOS: 5-7 days             -- Discharge Concerns: Need to establish a safety plan; Medication compliance and effectiveness             -- Discharge Goals: Return home with outpatient referrals follow ups    Long Term Goal(s): Improvement in symptoms so as ready for discharge   Short Term Goals: Ability to identify changes in lifestyle to reduce recurrence of condition will improve, Ability to verbalize feelings will improve, Ability to disclose and discuss suicidal ideas, Ability to demonstrate self-control will improve, Ability to identify and develop effective coping behaviors will improve, Ability to maintain clinical measurements within normal limits will improve, Compliance with prescribed medications will improve, and Ability to identify triggers associated with substance abuse/mental health issues will improve  Verner Chol, MD 03/03/2023, 4:13 PM

## 2023-03-03 NOTE — Plan of Care (Signed)
   03/02/23 2200  Psych Admission Type (Psych Patients Only)  Admission Status Voluntary  Psychosocial Assessment  Patient Complaints Depression  Eye Contact Brief  Facial Expression Flat  Affect Sullen  Speech Slow  Interaction Minimal  Motor Activity Slow  Appearance/Hygiene In scrubs  Behavior Characteristics Cooperative  Mood Depressed  Thought Process  Coherency WDL  Content WDL  Delusions None reported or observed  Perception WDL  Hallucination None reported or observed  Judgment Limited  Confusion None  Danger to Self  Current suicidal ideation? Denies  Danger to Others  Danger to Others None reported or observed      03/03/23 0600  15 Minute Checks  Location Bedroom  Visual Appearance Calm  Behavior Sleeping  Sleep (Behavioral Health Patients Only)  Calculate sleep? (Click Yes once per 24 hr at 0600 safety check) Yes  Documented sleep last 24 hours 11.25    Problem: Education: Goal: Knowledge of General Education information will improve Description: Including pain rating scale, medication(s)/side effects and non-pharmacologic comfort measures Outcome: Progressing   Problem: Health Behavior/Discharge Planning: Goal: Ability to manage health-related needs will improve Outcome: Progressing   Problem: Clinical Measurements: Goal: Ability to maintain clinical measurements within normal limits will improve Outcome: Progressing Goal: Will remain free from infection Outcome: Progressing Goal: Diagnostic test results will improve Outcome: Progressing

## 2023-03-03 NOTE — Group Note (Signed)
 LCSW Group Therapy Note   Group Date: 03/03/2023 Start Time: 1300 End Time: 1345   Type of Therapy and Topic:  Group Therapy: Challenging Core Beliefs  Participation Level:  Did Not Attend  Description of Group:  Patients were educated about core beliefs and asked to identify one harmful core belief that they have. Patients were asked to explore from where those beliefs originate. Patients were asked to discuss how those beliefs make them feel and the resulting behaviors of those beliefs. They were then be asked if those beliefs are true and, if so, what evidence they have to support them. Lastly, group members were challenged to replace those negative core beliefs with helpful beliefs.   Therapeutic Goals:   1. Patient will identify harmful core beliefs and explore the origins of such beliefs. 2. Patient will identify feelings and behaviors that result from those core beliefs. 3. Patient will discuss whether such beliefs are true. 4.  Patient will replace harmful core beliefs with helpful ones.  Summary of Patient Progress:  X  Therapeutic Modalities: Cognitive Behavioral Therapy; Solution-Focused Therapy   Elza Rafter, [place] 03/03/2023  2:47 PM

## 2023-03-03 NOTE — Group Note (Signed)
 Date:  03/03/2023 Time:  8:58 PM  Group Topic/Focus:  Self Care:   The focus of this group is to help patients understand the importance of self-care in order to improve or restore emotional, physical, spiritual, interpersonal, and financial health.    Participation Level:  Active  Participation Quality:  Appropriate  Affect:  Appropriate  Cognitive:  Appropriate  Insight: Appropriate and Good  Engagement in Group:  Engaged  Modes of Intervention:  Discussion  Additional Comments:    Burt Ek 03/03/2023, 8:58 PM

## 2023-03-03 NOTE — Progress Notes (Signed)
   03/03/23 0943  Psych Admission Type (Psych Patients Only)  Admission Status Voluntary  Psychosocial Assessment  Patient Complaints Depression;Isolation  Eye Contact Brief  Facial Expression Flat  Affect Flat  Speech Logical/coherent  Interaction Minimal  Motor Activity Slow  Appearance/Hygiene In scrubs  Behavior Characteristics Cooperative  Mood Depressed  Thought Process  Coherency WDL  Content WDL  Delusions None reported or observed  Perception WDL  Hallucination None reported or observed  Judgment Limited  Confusion None  Danger to Self  Current suicidal ideation? Denies  Agreement Not to Harm Self Yes  Description of Agreement verbal  Danger to Others  Danger to Others None reported or observed   Patient remained more isolative and only came out for meals. Did not attend any group.

## 2023-03-03 NOTE — Group Note (Signed)
 Date:  03/03/2023 Time:  11:10 AM  Group Topic/Focus:  Personal Choices and Values:   The focus of this group is to help patients assess and explore the importance of values in their lives, how their values affect their decisions, how they express their values and what opposes their expression. Wellness Toolbox:   The focus of this group is to discuss various aspects of wellness, balancing those aspects and exploring ways to increase the ability to experience wellness.  Patients will create a wellness toolbox for use upon discharge.    Participation Level:  Active  Participation Quality:  Appropriate  Affect:  Appropriate  Cognitive:  Appropriate  Insight: Appropriate  Engagement in Group:  Engaged and Supportive  Modes of Intervention:  Limit-setting, Socialization, and Support  Additional Comments:     Franklin [name] 03/03/2023, 11:10 AM

## 2023-03-03 NOTE — Group Note (Signed)
 Recreation Therapy Group Note   Group Topic:Communication  Group Date: 03/03/2023 Start Time: 1400 End Time: 1500 Facilitators: Rosina Lowenstein, LRT, CTRS Location:  Dayroom  Group Description: Emotional Check in. Patient sat and talked with LRT about how they are doing and whatever else is on their mind. LRT provided active listening, reassurance and encouragement. Pts were given the opportunity to listen to music or color mandalas while they talk.    Goal Area(s) Addressed: Patient will engage in conversation with LRT. Patient will communicate their wants, needs, or questions.  Patient will practice a new coping skill of "talking to someone".   Affect/Mood: N/A   Participation Level: Did not attend    Clinical Observations/Individualized Feedback: Franklin [name] did not attend group.   Plan: Continue to engage patient in RT group sessions 2-3x/week.   Rosina Lowenstein, LRT, CTRS 03/03/2023 4:47 PM

## 2023-03-04 DIAGNOSIS — F1994 Other psychoactive substance use, unspecified with psychoactive substance-induced mood disorder: Secondary | ICD-10-CM | POA: Diagnosis not present

## 2023-03-04 NOTE — Progress Notes (Signed)
   03/04/23 2000  Psych Admission Type (Psych Patients Only)  Admission Status Voluntary  Psychosocial Assessment  Patient Complaints Depression  Eye Contact Brief  Facial Expression Flat  Affect Flat  Speech Logical/coherent  Interaction Assertive  Motor Activity Slow  Appearance/Hygiene In scrubs  Behavior Characteristics Cooperative  Mood Depressed  Thought Process  Coherency WDL  Content WDL  Delusions None reported or observed  Perception WDL  Hallucination None reported or observed  Judgment Limited  Confusion None  Danger to Self  Current suicidal ideation? Denies  Agreement Not to Harm Self Yes  Description of Agreement verbal  Danger to Others  Danger to Others None reported or observed

## 2023-03-04 NOTE — Group Note (Signed)
 Recreation Therapy Group Note   Group Topic:Leisure Education  Group Date: 03/04/2023 Start Time: 1400 End Time: 1450 Facilitators: Rosina Lowenstein, LRT, CTRS Location: Courtyard  Group Description: Music. Patients encouraged to name their favorite song(s) for LRT to play song through speaker for group to hear. Patient educated on the definition of leisure and the importance of having different leisure interests outside of the hospital. Group discussed how leisure activities can often be used as Pharmacologist and that listening to music is one example.   Goal Area(s) Addressed:  Patient will identify a current leisure interest.  Patient will practice making a positive decision. Patient will have the opportunity to try a new leisure activity.  Affect/Mood: N/A   Participation Level: Did not attend    Clinical Observations/Individualized Feedback: Patient did not attend group.   Plan: Continue to engage patient in RT group sessions 2-3x/week.   [address], LRT, CTRS 03/04/2023 4:14 PM

## 2023-03-04 NOTE — Progress Notes (Signed)
 Occupational Therapy Group Note  Group Topic:  Neurographic Art Group Date: 03/04/2023 Group Time (start and end): 1300-1400 Facilitators: Wynona Canes, OTR/L  Group Description: Group participated with Neurographic art activity, using watercolor paints to facilitate creative expression and meditation/relaxation for each individual.  Incorporated bimanual coordination, mental focus, emotional processing, task/command following and relaxation techniques as appropriate.  Patients engaged socially with therapist and other group participants throughout session. Allowed to ask questions as appropriate, and encouraged to identify ways they could use/share their creations with themselves and others.   Therapeutic Goal(s): Demonstrate ability to independently manipulate utensils required to participate with and complete activity. Demonstrate ability to cognitively focus on task and follow commands necessary for completion. Demonstrate use of art as an outlet for emotional processing and expression. Identify and demonstrate importance of relaxation, neural calming and meditation for improved participation with life groups.   Individual Participation: Pt active and engaged. Demonstrated good ability to follow instructions, sustained moderate focus to task, engaged with therapist when prompted, and interacted with another group member unprompted. One visual cue to clarify instruction provided. Pt did take a small break to watch TV (muted) but did not leave the activity table.     Participation Level: Engaged through majority of group   Participation Quality: Interactive with prompts   Behavior: Appropriate   Speech/Thought Process: Appropriate   Affect/Mood: Appropriate   Modes of Intervention: Verbal instruction, visual instruction, hands on activity, calming music in background   Patient Response to Interventions:  Pt painted >50% of the page. Declined to keep but appreciative of the opportunity  to participate.       Plan: Continue to engage patient in PT/OT groups 1-2x/week.  Arman Filter., MPH, MS, OTR/L ascom [phone] 03/04/23, 3:34 PM

## 2023-03-04 NOTE — Group Note (Signed)
 Date:  03/04/2023 Time:  8:18 PM  Group Topic/Focus:  Identifying Needs:   The focus of this group is to help patients identify their personal needs that have been historically problematic and identify healthy behaviors to address their needs.    Participation Level:  Active  Participation Quality:  Appropriate  Affect:  Appropriate  Cognitive:  Appropriate  Insight: Appropriate and Good  Engagement in Group:  Engaged  Modes of Intervention:  Discussion  Additional Comments:    Burt Ek 03/04/2023, 8:18 PM

## 2023-03-04 NOTE — [facility] Counselor (Signed)
 CSW has sent requested information to [facility].  CSW received confirmation fax was successful.  Penni Homans, MSW, LCSW 03/04/2023 3:44 PM

## 2023-03-04 NOTE — Progress Notes (Signed)
   03/04/23 1300  Psych Admission Type (Psych Patients Only)  Admission Status Voluntary  Psychosocial Assessment  Patient Complaints Depression  Eye Contact Brief  Facial Expression Flat  Affect Flat  Speech Logical/coherent  Interaction Minimal  Motor Activity Slow  Appearance/Hygiene In scrubs  Behavior Characteristics Cooperative  Mood Depressed  Thought Process  Coherency WDL  Content WDL  Delusions None reported or observed  Perception WDL  Hallucination None reported or observed  Judgment Limited  Confusion None  Danger to Self  Current suicidal ideation? Denies  Agreement Not to Harm Self Yes  Description of Agreement verbal  Danger to Others  Danger to Others None reported or observed

## 2023-03-04 NOTE — Progress Notes (Signed)
 [facility] MD Progress Note  03/04/2023 10:56 AM Franklin [name]  MRN:  [id number]  Franklin [name] is a 53 y.o. male presenting to [facility] emergency department on 02/27/2023 2:25 AM for suicidal ideations. Patient reports going to take some pills or either cut my wrist. Patient states I took some pills a few days ago but I guess it was not enough. He has a psychiatric history of major depressive disorder, suicidal ideation, cocaine use disorder, alcohol use disorder, tobacco abuse, and generalized anxiety disorder .  Subjective:  Chart reviewed, case discussed in multidisciplinary meeting, patient seen during rounds.  Patient is noted to be sleeping in his room.  Per nursing report patient is not attending groups and is just isolated to his room.  He rates his depression 9 out of 10, 10 being the worst and anxiety as 9 out of 10.  He denies SI/HI/plan.  He denies auditory/visual hallucinations.  He is tolerating Prozac increase dose with no problems.  He has fair appetite and sleep.  He offers no other physical problems or complaints.  Nursing is encouraged to lock the patient's room during the group time so that patient is able to participate in groups and treatment plan.  Sleep: Fair  Appetite:  Fair  Past Psychiatric History: see h&P Family History:  Family History  Problem Relation Age of Onset   Hypertension Other    Diabetes Other    Social History:  Social History   Substance and Sexual Activity  Alcohol Use Yes   Alcohol/week: 12.0 standard drinks of alcohol   Types: 12 Cans of beer per week     Social History   Substance and Sexual Activity  Drug Use Yes   Types: "Crack" cocaine, Cocaine   Comment: one gram daily    Social History   Socioeconomic History   Marital status: Single    Spouse name: Not on file   Number of children: Not on file   Years of education: Not on file   Highest education level: Not on file  Occupational History   Occupation: [employer]   Tobacco Use   Smoking status: Some Days    Current packs/day: 0.50    Types: Cigarettes   Smokeless tobacco: Never  Vaping Use   Vaping status: Never Used  Substance and Sexual Activity   Alcohol use: Yes    Alcohol/week: 12.0 standard drinks of alcohol    Types: 12 Cans of beer per week   Drug use: Yes    Types: "Crack" cocaine, Cocaine    Comment: one gram daily   Sexual activity: Yes  Other Topics Concern   Not on file  Social History Narrative   The patient was born and raised in with that by both his biological parents. His father's past way but his mother still living. He denies any history of any physical or sexual abuse. He says he completed 2 years of college at [employer]. 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 He has never been married but has a 64 year old son who lives with his mother. He says he does get to see his son and has a relationship with him. He is not currently dating or in a relationship.      The patient does have a history of a DUI and had a court date last Friday for a DUI. He denies any other pending charges.  Social Drivers of Corporate investment banker Strain: Not on file  Food Insecurity: Food Insecurity Present (02/27/2023)   Hunger Vital Sign    Worried About Running Out of Food in the Last Year: Sometimes true    Ran Out of Food in the Last Year: Sometimes true  Transportation Needs: Unmet Transportation Needs (02/27/2023)   PRAPARE - Administrator, Civil Service (Medical): Yes    Lack of Transportation (Non-Medical): Yes  Physical Activity: Not on file  Stress: Not on file  Social Connections: Patient Declined (02/27/2023)   Social Connection and Isolation Panel [NHANES]    Frequency of Communication with Friends and Family: Patient declined    Frequency of Social Gatherings with Friends and Family: Patient declined    Attends Religious  Services: Patient declined    Database administrator or Organizations: Patient declined    Attends Engineer, structural: Patient declined    Marital Status: Patient declined   Past Medical History:  Past Medical History:  Diagnosis Date   Anxiety    COPD (chronic obstructive pulmonary disease) (HCC)    Depression    Substance abuse (HCC)     Past Surgical History:  Procedure Laterality Date   NO PAST SURGERIES      Current Medications: Current Facility-Administered Medications  Medication Dose Route Frequency Provider Last Rate Last Admin   acetaminophen (TYLENOL) tablet 650 mg  650 mg Oral Q6H PRN Penn, Cicely, NP       albuterol (PROVENTIL) (2.5 MG/3ML) 0.083% nebulizer solution 2.5 mg  2.5 mg Inhalation Q4H PRN Penn, Cicely, NP       alum & mag hydroxide-simeth (MAALOX/MYLANTA) 200-200-20 MG/5ML suspension 30 mL  30 mL Oral Q4H PRN Penn, Cicely, NP       FLUoxetine (PROZAC) capsule 30 mg  30 mg Oral Daily Verner Chol, MD   30 mg at 03/04/23 0910   magnesium hydroxide (MILK OF MAGNESIA) suspension 30 mL  30 mL Oral Daily PRN Penn, Cranston Neighbor, NP       OLANZapine (ZYPREXA) injection 5 mg  5 mg Intramuscular TID PRN Penn, Cicely, NP       OLANZapine zydis (ZYPREXA) disintegrating tablet 5 mg  5 mg Oral TID PRN Penn, Cicely, NP       Vitamin D (Ergocalciferol) (DRISDOL) 1.25 MG (50000 UNIT) capsule 50,000 Units  50,000 Units Oral Weekly Verner Chol, MD   50,000 Units at 03/04/23 0981    Lab Results: No results found for this or any previous visit (from the past 48 hours).  Blood Alcohol level:  Lab Results  Component Value Date   ETH <10 02/27/2023   ETH <10 02/19/2023    Metabolic Disorder Labs: Lab Results  Component Value Date   HGBA1C 5.9 (A) 07/12/2022   MPG 117 02/02/2009   No results found for: "PROLACTIN" Lab Results  Component Value Date   CHOL 203 (H) 10/31/2022   TRIG 196 (H) 10/31/2022   HDL 84 10/31/2022   CHOLHDL 2.4 10/31/2022   VLDL 39  10/31/2022   LDLCALC 80 10/31/2022   LDLCALC 63 10/08/2014    Physical Findings: AIMS:  , ,  ,  ,    CIWA:    COWS:      Psychiatric Specialty Exam:  Presentation  General Appearance:  Appropriate for Environment; Casual  Eye Contact: Fair  Speech: Clear and Coherent  Speech Volume: Normal    Mood and Affect  Mood: Hopeless; Depressed  Affect: Depressed;  Flat   Thought Process  Thought Processes: Coherent  Descriptions of Associations:Intact  Orientation:Full (Time, Place and Person)  Thought Content:Logical  Hallucinations:Hallucinations: None  Ideas of Reference:None  Suicidal Thoughts:Suicidal Thoughts: No SI Passive Intent and/or Plan: Without Intent; Without Plan  Homicidal Thoughts:Homicidal Thoughts: No   Sensorium  Memory: Immediate Fair; Recent Fair; Remote Fair  Judgment: Impaired  Insight: Shallow   Executive Functions  Concentration: Fair  Attention Span: Fair  Recall: Fair  Fund of Knowledge: Fair  Language: Fair   Psychomotor Activity  Psychomotor Activity: Psychomotor Activity: Normal  Musculoskeletal: Strength & Muscle Tone: within normal limits Gait & Station: normal Assets  Assets: Manufacturing systems engineer; Desire for Improvement; Physical Health    Physical Exam: Physical Exam Vitals and nursing note reviewed.  HENT:     Head: Normocephalic.     Nose: Nose normal.     Mouth/Throat:     Mouth: Mucous membranes are moist.  Eyes:     Pupils: Pupils are equal, round, and reactive to light.  Cardiovascular:     Rate and Rhythm: Normal rate.  Pulmonary:     Breath sounds: Normal breath sounds.  Abdominal:     General: Bowel sounds are normal.  Skin:    General: Skin is warm.  Neurological:     General: No focal deficit present.     Mental Status: He is alert.    Review of Systems  Constitutional: Negative.   HENT: Negative.    Eyes: Negative.   Respiratory: Negative.     Cardiovascular: Negative.   Gastrointestinal: Negative.   Skin: Negative.   Neurological: Negative.    Blood pressure 119/75, pulse 65, temperature 98.4 F (36.9 C), resp. rate 17, height 5\' 10"  (1.778 m), weight 73.5 kg, SpO2 96%. Body mass index is 23.24 kg/m.  MDD (major depressive disorder), Recurrent severe with out psychosis Vs Substance induced mood disorder Alcohol use disorder  Stimulant use disorder     Clinical Decision Making: Patient with history of depression, polysubstance use presented today for worsening depression, suicidal ideation.  He endorses hopelessness and anhedonia with worsening anxiety.  Given polysubstance use is noted to be impulsive in nature.  He carries a moderate risk for suicide.  Patient is currently admitted to geropsych inpatient unit.   Treatment Plan Summary:   Safety and Monitoring:             -- Voluntary admission to inpatient psychiatric unit for safety, stabilization and treatment             -- Daily contact with patient to assess and evaluate symptoms and progress in treatment             -- Patient's case to be discussed in multi-disciplinary team meeting             -- Observation Level: q15 minute checks             -- Vital signs:  q12 hours             -- Precautions: suicide, elopement, and assault   2. Psychiatric Diagnoses and Treatment:    Prozac 30 mg to optimize the treatment for depression.             Patient reports being on her Risperdal and Prozac Patient is not currently psychotic so will hold off Risperdal at this time     -- The risks/benefits/side-effects/alternatives to this medication were discussed in detail with the patient and time was given for  questions. The patient consents to medication trial.                -- Metabolic profile and EKG monitoring obtained while on an atypical antipsychotic (BMI: Lipid Panel: HbgA1c: QTc:)              -- Encouraged patient to participate in unit milieu and in scheduled  group therapies                            3. Medical Issues Being Addressed:  No medical emergent needs noted   4. Discharge Planning:              -- Social work and case management to assist with discharge planning and identification of hospital follow-up needs prior to discharge             -- Estimated LOS: 5-7 days             -- Discharge Concerns: Need to establish a safety plan; Medication compliance and effectiveness             -- Discharge Goals: Return home with outpatient referrals follow ups    Long Term Goal(s): Improvement in symptoms so as ready for discharge   Short Term Goals: Ability to identify changes in lifestyle to reduce recurrence of condition will improve, Ability to verbalize feelings will improve, Ability to disclose and discuss suicidal ideas, Ability to demonstrate self-control will improve, Ability to identify and develop effective coping behaviors will improve, Ability to maintain clinical measurements within normal limits will improve, Compliance with prescribed medications will improve, and Ability to identify triggers associated with substance abuse/mental health issues will improve  Verner Chol, MD 03/04/2023, 10:56 AM

## 2023-03-04 NOTE — Plan of Care (Addendum)
   03/03/23 2000  Psych Admission Type (Psych Patients Only)  Admission Status Voluntary  Psychosocial Assessment  Patient Complaints Depression  Eye Contact Brief  Facial Expression Flat  Affect Sullen  Speech Logical/coherent  Interaction Minimal  Motor Activity Slow  Appearance/Hygiene In scrubs  Behavior Characteristics Cooperative  Mood Depressed  Thought Process  Coherency WDL  Content WDL  Delusions None reported or observed  Perception WDL  Hallucination None reported or observed  Judgment Limited  Confusion None  Danger to Self  Current suicidal ideation? Denies  Danger to Others  Danger to Others None reported or observed      03/04/23 0615  15 Minute Checks  Location Bedroom  Visual Appearance Calm  Behavior Sleeping  Sleep (Behavioral Health Patients Only)  Calculate sleep? (Click Yes once per 24 hr at 0600 safety check) Yes  Documented sleep last 24 hours 13.75    Problem: Education: Goal: Knowledge of General Education information will improve Description: Including pain rating scale, medication(s)/side effects and non-pharmacologic comfort measures Outcome: Progressing   Problem: Health Behavior/Discharge Planning: Goal: Ability to manage health-related needs will improve Outcome: Progressing   Problem: Clinical Measurements: Goal: Ability to maintain clinical measurements within normal limits will improve Outcome: Progressing Goal: Will remain free from infection Outcome: Progressing   Problem: Nutrition: Goal: Adequate nutrition will be maintained Outcome: Progressing

## 2023-03-05 DIAGNOSIS — R45851 Suicidal ideations: Secondary | ICD-10-CM

## 2023-03-05 LAB — LIPID PANEL
Cholesterol: 193 mg/dL (ref 0–200)
HDL: 52 mg/dL (ref >40)
LDL Cholesterol: 105 mg/dL — ABNORMAL HIGH (ref 0–99)
Total CHOL/HDL Ratio: 3.7 ratio
Triglycerides: 178 mg/dL — ABNORMAL HIGH (ref <150)
VLDL: 36 mg/dL (ref 0–40)

## 2023-03-05 LAB — HEMOGLOBIN A1C
Hgb A1c MFr Bld: 5.6 % (ref 4.8–5.6)
Mean Plasma Glucose: 114.02 mg/dL

## 2023-03-05 NOTE — Plan of Care (Signed)

## 2023-03-05 NOTE — Progress Notes (Signed)
 [facility] MD Progress Note  03/05/2023  Franklin [name]  MRN:  [id number]  Franklin [name] is a 53 y.o. male presenting to [facility] emergency department on 02/27/2023 2:25 AM for suicidal ideations. Patient reports going to take some pills or either cut my wrist. Patient states I took some pills a few days ago but I guess it was not enough. He has a psychiatric history of major depressive disorder, suicidal ideation, cocaine use disorder, alcohol use disorder, tobacco abuse, and generalized anxiety disorder .  Subjective:  Chart reviewed, case discussed in multidisciplinary meeting, patient seen during rounds.  Per nursing report patient is not attending groups and is just isolated to his room.  Not much change in mental status exam since yesterday.  Patient continues to have off-and-on suicidal thoughts, denies any intention to harm himself on the unit.  Patient was encouraged to participate in milieu and attend groups.  Patient reports that social worker has informed patient about his transfer to [facility] upon availability of bed.  He denies auditory/visual hallucinations.  He has fair appetite and sleep.  He offers no other physical problems or complaints.    Sleep: Fair  Appetite:  Fair  Past Psychiatric History: see h&P Family History:  Family History  Problem Relation Age of Onset   Hypertension Other    Diabetes Other    Social History:  Social History   Substance and Sexual Activity  Alcohol Use Yes   Alcohol/week: 12.0 standard drinks of alcohol   Types: 12 Cans of beer per week     Social History   Substance and Sexual Activity  Drug Use Yes   Types: "Crack" cocaine, Cocaine   Comment: one gram daily    Social History   Socioeconomic History   Marital status: Single    Spouse name: Not on file   Number of children: Not on file   Years of education: Not on file   Highest education level: Not on file  Occupational History   Occupation: [employer]  Tobacco Use    Smoking status: Some Days    Current packs/day: 0.50    Types: Cigarettes   Smokeless tobacco: Never  Vaping Use   Vaping status: Never Used  Substance and Sexual Activity   Alcohol use: Yes    Alcohol/week: 12.0 standard drinks of alcohol    Types: 12 Cans of beer per week   Drug use: Yes    Types: "Crack" cocaine, Cocaine    Comment: one gram daily   Sexual activity: Yes  Other Topics Concern   Not on file  Social History Narrative   The patient was born and raised in with that by both his biological parents. His father's past way but his mother still living. He denies any history of any physical or sexual abuse. He says he completed 2 years of college at [employer]. 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 He has never been married but has a 35 year old son who lives with his mother. He says he does get to see his son and has a relationship with him. He is not currently dating or in a relationship.      The patient does have a history of a DUI and had a court date last Friday for a DUI. He denies any other pending charges.            Social Drivers of Health  Financial Resource Strain: Not on file  Food Insecurity: Food Insecurity Present (02/27/2023)   Hunger Vital Sign    Worried About Running Out of Food in the Last Year: Sometimes true    Ran Out of Food in the Last Year: Sometimes true  Transportation Needs: Unmet Transportation Needs (02/27/2023)   PRAPARE - Administrator, Civil Service (Medical): Yes    Lack of Transportation (Non-Medical): Yes  Physical Activity: Not on file  Stress: Not on file  Social Connections: Patient Declined (02/27/2023)   Social Connection and Isolation Panel [NHANES]    Frequency of Communication with Friends and Family: Patient declined    Frequency of Social Gatherings with Friends and Family: Patient declined    Attends Religious Services: Patient  declined    Database administrator or Organizations: Patient declined    Attends Engineer, structural: Patient declined    Marital Status: Patient declined   Past Medical History:  Past Medical History:  Diagnosis Date   Anxiety    COPD (chronic obstructive pulmonary disease) (HCC)    Depression    Substance abuse (HCC)     Past Surgical History:  Procedure Laterality Date   NO PAST SURGERIES      Current Medications: Current Facility-Administered Medications  Medication Dose Route Frequency Provider Last Rate Last Admin   acetaminophen (TYLENOL) tablet 650 mg  650 mg Oral Q6H PRN Penn, Cicely, NP       albuterol (PROVENTIL) (2.5 MG/3ML) 0.083% nebulizer solution 2.5 mg  2.5 mg Inhalation Q4H PRN Penn, Cicely, NP       alum & mag hydroxide-simeth (MAALOX/MYLANTA) 200-200-20 MG/5ML suspension 30 mL  30 mL Oral Q4H PRN Penn, Cicely, NP       FLUoxetine (PROZAC) capsule 30 mg  30 mg Oral Daily Verner Chol, MD   30 mg at 03/05/23 1015   magnesium hydroxide (MILK OF MAGNESIA) suspension 30 mL  30 mL Oral Daily PRN Penn, Cranston Neighbor, NP       OLANZapine (ZYPREXA) injection 5 mg  5 mg Intramuscular TID PRN Penn, Cicely, NP       OLANZapine zydis (ZYPREXA) disintegrating tablet 5 mg  5 mg Oral TID PRN Penn, Cranston Neighbor, NP       Vitamin D (Ergocalciferol) (DRISDOL) 1.25 MG (50000 UNIT) capsule 50,000 Units  50,000 Units Oral Weekly Verner Chol, MD   50,000 Units at 03/04/23 1610    Lab Results:  Results for orders placed or performed during the hospital encounter of 02/27/23 (from the past 48 hours)  Lipid panel     Status: Abnormal   Collection Time: 03/05/23  7:10 AM  Result Value Ref Range   Cholesterol 193 0 - 200 mg/dL   Triglycerides 960 (H) <150 mg/dL   HDL 52 >45 mg/dL   Total CHOL/HDL Ratio 3.7 RATIO   VLDL 36 0 - 40 mg/dL   LDL Cholesterol 409 (H) 0 - 99 mg/dL    Comment:        Total Cholesterol/HDL:CHD Risk Coronary Heart Disease Risk Table                      Men   Women  1/2 Average Risk   3.4   3.3  Average Risk       5.0   4.4  2 X Average Risk   9.6   7.1  3 X Average Risk  23.4   11.0  Use the calculated Patient Ratio above and the CHD Risk Table to determine the patient's CHD Risk.        ATP III CLASSIFICATION (LDL):  <100     mg/dL   Optimal  147-829  mg/dL   Near or Above                    Optimal  130-159  mg/dL   Borderline  562-130  mg/dL   High  >865     mg/dL   Very High Performed at [facility], [address] [place], [place] [address]     Blood Alcohol level:  Lab Results  Component Value Date   [facility] <10 02/27/2023   ETH <10 02/19/2023    Metabolic Disorder Labs: Lab Results  Component Value Date   HGBA1C 5.9 (A) 07/12/2022   MPG 117 02/02/2009   No results found for: "PROLACTIN" Lab Results  Component Value Date   CHOL 193 03/05/2023   TRIG 178 (H) 03/05/2023   HDL 52 03/05/2023   CHOLHDL 3.7 03/05/2023   VLDL 36 03/05/2023   LDLCALC 105 (H) 03/05/2023   LDLCALC 80 10/31/2022    Psychiatric Specialty Exam:  Presentation  General Appearance:   Casual  Eye Contact: Fair  Speech: Clear and Coherent  Speech Volume: Normal    Mood and Affect  Mood: Depressed  Affect: Constricted   Thought Process  Thought Processes: Coherent  Descriptions of Associations:Intact  Orientation:Full (Time, Place and Person)  Thought Content:Logical  Hallucinations:Hallucinations: None  Ideas of Reference:None  Suicidal Thoughts: Positive off-and-on suicidal thoughts  Homicidal Thoughts:Homicidal Thoughts: No   Sensorium  Memory: Immediate Fair; Recent Fair; Remote Fair  Judgment: Improving  Insight: Improving   Executive Functions  Concentration: Fair  Attention Span: Fair  Recall: [employer] of Knowledge: Fair  Language: Fair   Psychomotor Activity  Psychomotor Activity: Psychomotor Activity: Normal  Musculoskeletal: Strength & Muscle  Tone: within normal limits Gait & Station: normal Assets  Assets: Manufacturing systems engineer; Desire for Improvement; Physical Health    Physical Exam: Physical Exam Vitals and nursing note reviewed.  HENT:     Head: Normocephalic.     Nose: Nose normal.     Mouth/Throat:     Mouth: Mucous membranes are moist.  Eyes:     Pupils: Pupils are equal, round, and reactive to light.  Cardiovascular:     Rate and Rhythm: Normal rate.  Pulmonary:     Breath sounds: Normal breath sounds.  Abdominal:     General: Bowel sounds are normal.  Skin:    General: Skin is warm.  Neurological:     General: No focal deficit present.     Mental Status: He is alert.    Review of Systems  Constitutional: Negative.   HENT: Negative.    Eyes: Negative.   Respiratory: Negative.    Cardiovascular: Negative.   Gastrointestinal: Negative.   Skin: Negative.   Neurological: Negative.    Blood pressure 129/81, pulse 66, temperature (!) 96.6 F (35.9 C), resp. rate 18, height 5\' 10"  (1.778 m), weight 73.5 kg, SpO2 96%. Body mass index is 23.24 kg/m.  MDD (major depressive disorder), Recurrent severe with out psychosis Vs Substance induced mood disorder Alcohol use disorder  Stimulant use disorder     Clinical Decision Making: Patient with history of depression, polysubstance use presented today for worsening depression, suicidal ideation.  He endorses hopelessness and anhedonia with worsening anxiety.  Given polysubstance use is  noted to be impulsive in nature.  He carries a moderate risk for suicide.  Patient is currently admitted to geropsych inpatient unit.   Treatment Plan Summary:   Safety and Monitoring:             -- Voluntary admission to inpatient psychiatric unit for safety, stabilization and treatment             -- Daily contact with patient to assess and evaluate symptoms and progress in treatment             -- Patient's case to be discussed in multi-disciplinary team meeting              -- Observation Level: q15 minute checks             -- Vital signs:  q12 hours             -- Precautions: suicide, elopement, and assault   2. Psychiatric Diagnoses and Treatment:    Prozac 30 mg to optimize the treatment for depression.             Patient reports being on her Risperdal and Prozac Patient is not currently psychotic so will hold off Risperdal at this time     -- The risks/benefits/side-effects/alternatives to this medication were discussed in detail with the patient and time was given for questions. The patient consents to medication trial.                             3. Medical Issues Being Addressed:  No medical emergent needs noted   4. Discharge Planning:              -- Social work and case management to assist with discharge planning and identification of hospital follow-up needs prior to discharge             -- Estimated LOS: 4-5 days, patient awaiting bed at [facility]             -- Discharge Concerns: Need to establish a safety plan; Medication compliance and effectiveness             -- Discharge Goals: Return home with outpatient referrals follow ups    Long Term Goal(s): Improvement in symptoms so as ready for discharge   Short Term Goals: Ability to identify changes in lifestyle to reduce recurrence of condition will improve, Ability to verbalize feelings will improve, Ability to disclose and discuss suicidal ideas, Ability to demonstrate self-control will improve, Ability to identify and develop effective coping behaviors will improve, Ability to maintain clinical measurements within normal limits will improve, Compliance with prescribed medications will improve, and Ability to identify triggers associated with substance abuse/mental health issues will improve  Lewanda Rife, MD

## 2023-03-05 NOTE — Group Note (Signed)
 Date:  03/05/2023 Time:  11:21 PM  Group Topic/Focus:  Goals Group:   The focus of this group is to help patients establish daily goals to achieve during treatment and discuss how the patient can incorporate goal setting into their daily lives to aide in recovery.    Participation Level:  Active  Participation Quality:  Appropriate  Affect:  Appropriate  Cognitive:  Appropriate  Insight: Good  Engagement in Group:  Engaged  Modes of Intervention:  Discussion  Additional Comments:    Burt Ek 03/05/2023, 11:21 PM

## 2023-03-05 NOTE — Progress Notes (Signed)
 Patient is pleasant and cooperative.  Sad affect. Endorses depression.  Denies SI/HI and AVH.   Denies pain. Reports he slept well. Good appetite.   Compliant with scheduled medication.  15 min checks in place for safety.   Patient is present in the milieu.  Appropriate interaction with peers.

## 2023-03-05 NOTE — BH IP Treatment Plan (Signed)
 Interdisciplinary Treatment and Diagnostic Plan Update  03/05/2023 Time of Session: 0930am Franklin [name] MRN: [id number]  Principal Diagnosis: <principal problem not specified>  Secondary Diagnoses: Active Problems:   MDD (major depressive disorder)   Current Medications:  Current Facility-Administered Medications  Medication Dose Route Frequency Provider Last Rate Last Admin   acetaminophen (TYLENOL) tablet 650 mg  650 mg Oral Q6H PRN Penn, Cicely, NP       albuterol (PROVENTIL) (2.5 MG/3ML) 0.083% nebulizer solution 2.5 mg  2.5 mg Inhalation Q4H PRN Penn, Cicely, NP       alum & mag hydroxide-simeth (MAALOX/MYLANTA) 200-200-20 MG/5ML suspension 30 mL  30 mL Oral Q4H PRN Penn, Cicely, NP       FLUoxetine (PROZAC) capsule 30 mg  30 mg Oral Daily Verner Chol, MD   30 mg at 03/04/23 4098   magnesium hydroxide (MILK OF MAGNESIA) suspension 30 mL  30 mL Oral Daily PRN Penn, Cranston Neighbor, NP       OLANZapine (ZYPREXA) injection 5 mg  5 mg Intramuscular TID PRN Penn, Cicely, NP       OLANZapine zydis (ZYPREXA) disintegrating tablet 5 mg  5 mg Oral TID PRN Penn, Cranston Neighbor, NP       Vitamin D (Ergocalciferol) (DRISDOL) 1.25 MG (50000 UNIT) capsule 50,000 Units  50,000 Units Oral Weekly Verner Chol, MD   50,000 Units at 03/04/23 1191   PTA Medications: Medications Prior to Admission  Medication Sig Dispense Refill Last Dose/Taking   albuterol (VENTOLIN HFA) 108 (90 Base) MCG/ACT inhaler Inhale 2 puffs into the lungs every 4 (four) hours as needed for wheezing or shortness of breath. 1 each 0    amitriptyline (ELAVIL) 25 MG tablet Take 25 mg by mouth at bedtime.      ARIPiprazole (ABILIFY) 5 MG tablet Take 5 mg by mouth daily.      azithromycin (ZITHROMAX) 250 MG tablet Take 1 tablet (250 mg total) by mouth daily. Take 1 tablet by mouth daily, first dose given in the emergency department (Patient not taking: Reported on 02/27/2023) 4 tablet 0    chlordiazePOXIDE (LIBRIUM) 25 MG capsule 50mg  PO TID x  1D, then 25-50mg  PO BID X 1D, then 25-50mg  PO QD X 1D (Patient not taking: Reported on 02/27/2023) 10 capsule 0    DULERA 200-5 MCG/ACT AERO Inhale 2 puffs into the lungs in the morning and at bedtime.      FLUoxetine (PROZAC) 20 MG capsule Take 1 capsule (20 mg total) by mouth daily. 30 capsule 0    IBU 800 MG tablet Take 800 mg by mouth every 6 (six) hours as needed for moderate pain (pain score 4-6).      predniSONE (DELTASONE) 50 MG tablet Take 1 tab by mouth daily (Patient not taking: Reported on 02/27/2023) 4 tablet 0    risperiDONE (RISPERDAL) 1 MG tablet Take 1 tablet (1 mg total) by mouth at bedtime. 30 tablet 0    traZODone (DESYREL) 50 MG tablet Take 1 tablet (50 mg total) by mouth at bedtime as needed for sleep. 30 tablet 0    VENTOLIN HFA 108 (90 Base) MCG/ACT inhaler Inhale 2 puffs into the lungs every 6 (six) hours as needed for wheezing or shortness of breath. (Patient not taking: Reported on 02/27/2023)      Vitamin D, Ergocalciferol, (DRISDOL) 1.25 MG (50000 UNIT) CAPS capsule Take 50,000 Units by mouth once a week.       Patient Stressors:    Patient Strengths:    Treatment Modalities:  Medication Management, Group therapy, Case management,  1 to 1 session with clinician, Psychoeducation, Recreational therapy.   Physician Treatment Plan for Primary Diagnosis: <principal problem not specified> Long Term Goal(s): Improvement in symptoms so as ready for discharge   Short Term Goals: Ability to identify changes in lifestyle to reduce recurrence of condition will improve Ability to verbalize feelings will improve Ability to disclose and discuss suicidal ideas Ability to demonstrate self-control will improve Ability to identify and develop effective coping behaviors will improve Ability to maintain clinical measurements within normal limits will improve Compliance with prescribed medications will improve Ability to identify triggers associated with substance abuse/mental health  issues will improve  Medication Management: Evaluate patient's response, side effects, and tolerance of medication regimen.  Therapeutic Interventions: 1 to 1 sessions, Unit Group sessions and Medication administration.  Evaluation of Outcomes: Not Progressing  Physician Treatment Plan for Secondary Diagnosis: Active Problems:   MDD (major depressive disorder)  Long Term Goal(s): Improvement in symptoms so as ready for discharge   Short Term Goals: Ability to identify changes in lifestyle to reduce recurrence of condition will improve Ability to verbalize feelings will improve Ability to disclose and discuss suicidal ideas Ability to demonstrate self-control will improve Ability to identify and develop effective coping behaviors will improve Ability to maintain clinical measurements within normal limits will improve Compliance with prescribed medications will improve Ability to identify triggers associated with substance abuse/mental health issues will improve     Medication Management: Evaluate patient's response, side effects, and tolerance of medication regimen.  Therapeutic Interventions: 1 to 1 sessions, Unit Group sessions and Medication administration.  Evaluation of Outcomes: Not Progressing   RN Treatment Plan for Primary Diagnosis: <principal problem not specified> Long Term Goal(s): Knowledge of disease and therapeutic regimen to maintain health will improve  Short Term Goals: Ability to remain free from injury will improve, Ability to verbalize frustration and anger appropriately will improve, Ability to demonstrate self-control, Ability to participate in decision making will improve, Ability to verbalize feelings will improve, Ability to disclose and discuss suicidal ideas, Ability to identify and develop effective coping behaviors will improve, and Compliance with prescribed medications will improve  Medication Management: RN will administer medications as ordered by  provider, will assess and evaluate patient's response and provide education to patient for prescribed medication. RN will report any adverse and/or side effects to prescribing provider.  Therapeutic Interventions: 1 on 1 counseling sessions, Psychoeducation, Medication administration, Evaluate responses to treatment, Monitor vital signs and CBGs as ordered, Perform/monitor CIWA, COWS, AIMS and Fall Risk screenings as ordered, Perform wound care treatments as ordered.  Evaluation of Outcomes: Not Progressing   LCSW Treatment Plan for Primary Diagnosis: <principal problem not specified> Long Term Goal(s): Safe transition to appropriate next level of care at discharge, Engage patient in therapeutic group addressing interpersonal concerns.  Short Term Goals: Engage patient in aftercare planning with referrals and resources, Increase social support, Increase ability to appropriately verbalize feelings, Increase emotional regulation, Facilitate acceptance of mental health diagnosis and concerns, Facilitate patient progression through stages of change regarding substance use diagnoses and concerns, Identify triggers associated with mental health/substance abuse issues, and Increase skills for wellness and recovery  Therapeutic Interventions: Assess for all discharge needs, 1 to 1 time with Social worker, Explore available resources and support systems, Assess for adequacy in community support network, Educate family and significant other(s) on suicide prevention, Complete Psychosocial Assessment, Interpersonal group therapy.  Evaluation of Outcomes: Not Progressing   Progress in  Treatment: Attending groups: No. Participating in groups: No. Taking medication as prescribed: Yes. Toleration medication: Yes. Family/Significant other contact made: Yes, individual(s) contacted:  CSW will contact if given permission  Patient understands diagnosis: Yes. Discussing patient identified problems/goals with  staff: Yes. Medical problems stabilized or resolved: Yes. Denies suicidal/homicidal ideation: Yes. Issues/concerns per patient self-inventory: No. Other: none  New problem(s) identified: No, Describe:  none identified  New Short Term/Long Term Goal(s): detox, elimination of symptoms of psychosis, medication management for mood stabilization; elimination of SI thoughts; development of comprehensive mental wellness/sobriety plan.   Patient Goals:   "Get back on my medici cation, get really balanced, get detoxed, get back to treatment"   Discharge Plan or Barriers: CSW will assist with appropriate discharge planning     Reason for Continuation of Hospitalization: Depression Medical Issues Medication stabilization  Estimated Length of Stay:1-7 days  Last 3 [place] Suicide Severity Risk Score: Flowsheet Row Admission (Current) from 02/27/2023 in [facility] BE[facility]AL [facility] Most recent reading at 02/27/2023  3:00 PM ED from 02/27/2023 in [facility] Emergency Department at [facility] Most recent reading at 02/27/2023  1:59 AM ED from 02/18/2023 in [facility] Emergency Department at [facility] Most recent reading at 02/18/2023 11:59 PM  C-SSRS RISK CATEGORY Low Risk No Risk No Risk       Last PHQ 2/9 Scores:    12/09/2022    8:03 AM 12/06/2022    8:56 AM 12/05/2022    5:22 PM  Depression screen PHQ 2/9  Decreased Interest 0 3 3  Down, Depressed, Hopeless 0 3 3  PHQ - 2 Score 0 6 6  Altered sleeping 0  3  Tired, decreased energy 0  3  Change in appetite 0  3  Feeling bad or failure about yourself  0  3  Trouble concentrating 0  3  Moving slowly or fidgety/restless   3  Suicidal thoughts 0  3  PHQ-9 Score 0  27  Difficult doing work/chores   Extremely dIfficult    Scribe for Treatment Team: Charise Killian 03/05/2023 9:28 AM

## 2023-03-05 NOTE — Progress Notes (Signed)
   03/05/23 0600  15 Minute Checks  Location Bedroom  Visual Appearance Calm  Behavior Sleeping  Sleep (Behavioral Health Patients Only)  Calculate sleep? (Click Yes once per 24 hr at 0600 safety check) Yes  Documented sleep last 24 hours 9.75

## 2023-03-06 NOTE — Plan of Care (Signed)
   Problem: Education: Goal: Knowledge of General Education information will improve Description: Including pain rating scale, medication(s)/side effects and non-pharmacologic comfort measures Outcome: Progressing   Problem: Nutrition: Goal: Adequate nutrition will be maintained Outcome: Progressing   Problem: Coping: Goal: Level of anxiety will decrease Outcome: Progressing

## 2023-03-06 NOTE — Plan of Care (Signed)
 ?  Problem: Coping: ?Goal: Level of anxiety will decrease ?Outcome: Progressing ?  ?Problem: Safety: ?Goal: Ability to remain free from injury will improve ?Outcome: Progressing ?  ?

## 2023-03-06 NOTE — Group Note (Signed)
 Date:  03/06/2023 Time:  9:44 AM  Group Topic/Focus:  Making Healthy Choices:   The focus of this group is to help patients identify negative/unhealthy choices they were using prior to admission and identify positive/healthier coping strategies to replace them upon discharge.    Participation Level:  Did Not Attend   Rodena Goldmann 03/06/2023, 9:44 AM

## 2023-03-06 NOTE — Progress Notes (Signed)
 [facility] MD Progress Note  03/06/2023  Franklin [name]  MRN:  [id number]  Franklin [name] is a 53 y.o. male presenting to [facility] emergency department on 02/27/2023 2:25 AM for suicidal ideations. Patient reports going to take some pills or either cut my wrist. Patient states I took some pills a few days ago but I guess it was not enough. He has a psychiatric history of major depressive disorder, suicidal ideation, cocaine use disorder, alcohol use disorder, tobacco abuse, and generalized anxiety disorder .  Subjective:  Chart reviewed, case discussed in multidisciplinary meeting, patient seen during rounds.  Per nursing report participated in milieu yesterday.  He attended the groups as well.  Today patient reports that he feels in "little better".  He reports his sleep and appetite has improved.  Today he denies suicide thoughts, which is an improvement from yesterday. Patient reports that social worker has informed patient about his transfer to [facility] upon availability of bed.  He denies auditory/visual hallucinations.  He denies side effects from medicine  Sleep: Fair  Appetite:  Fair  Past Psychiatric History: see h&P Family History:  Family History  Problem Relation Age of Onset   Hypertension Other    Diabetes Other    Social History:  Social History   Substance and Sexual Activity  Alcohol Use Yes   Alcohol/week: 12.0 standard drinks of alcohol   Types: 12 Cans of beer per week     Social History   Substance and Sexual Activity  Drug Use Yes   Types: "Crack" cocaine, Cocaine   Comment: one gram daily    Social History   Socioeconomic History   Marital status: Single    Spouse name: Not on file   Number of children: Not on file   Years of education: Not on file   Highest education level: Not on file  Occupational History   Occupation: [employer]  Tobacco Use   Smoking status: Some Days    Current packs/day: 0.50    Types: Cigarettes   Smokeless tobacco:  Never  Vaping Use   Vaping status: Never Used  Substance and Sexual Activity   Alcohol use: Yes    Alcohol/week: 12.0 standard drinks of alcohol    Types: 12 Cans of beer per week   Drug use: Yes    Types: "Crack" cocaine, Cocaine    Comment: one gram daily   Sexual activity: Yes  Other Topics Concern   Not on file  Social History Narrative   The patient was born and raised in with that by both his biological parents. His father's past way but his mother still living. He denies any history of any physical or sexual abuse. He says he completed 2 years of college at [employer]. 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 He has never been married but has a 11 year old son who lives with his mother. He says he does get to see his son and has a relationship with him. He is not currently dating or in a relationship.      The patient does have a history of a DUI and had a court date last Friday for a DUI. He denies any other pending charges.            Social Drivers of Corporate investment banker Strain: Not on file  Food Insecurity: Food Insecurity Present (02/27/2023)   Hunger Vital Sign  Worried About Programme researcher, broadcasting/film/video in the Last Year: Sometimes true    Ran Out of Food in the Last Year: Sometimes true  Transportation Needs: Unmet Transportation Needs (02/27/2023)   PRAPARE - Administrator, Civil Service (Medical): Yes    Lack of Transportation (Non-Medical): Yes  Physical Activity: Not on file  Stress: Not on file  Social Connections: Patient Declined (02/27/2023)   Social Connection and Isolation Panel [NHANES]    Frequency of Communication with Friends and Family: Patient declined    Frequency of Social Gatherings with Friends and Family: Patient declined    Attends Religious Services: Patient declined    Database administrator or Organizations: Patient declined    Attends Museum/gallery exhibitions officer: Patient declined    Marital Status: Patient declined   Past Medical History:  Past Medical History:  Diagnosis Date   Anxiety    COPD (chronic obstructive pulmonary disease) (HCC)    Depression    Substance abuse (HCC)     Past Surgical History:  Procedure Laterality Date   NO PAST SURGERIES      Current Medications: Current Facility-Administered Medications  Medication Dose Route Frequency Provider Last Rate Last Admin   acetaminophen (TYLENOL) tablet 650 mg  650 mg Oral Q6H PRN Penn, Cicely, NP       albuterol (PROVENTIL) (2.5 MG/3ML) 0.083% nebulizer solution 2.5 mg  2.5 mg Inhalation Q4H PRN Penn, Cicely, NP       alum & mag hydroxide-simeth (MAALOX/MYLANTA) 200-200-20 MG/5ML suspension 30 mL  30 mL Oral Q4H PRN Penn, Cicely, NP       FLUoxetine (PROZAC) capsule 30 mg  30 mg Oral Daily Verner Chol, MD   30 mg at 03/06/23 7829   magnesium hydroxide (MILK OF MAGNESIA) suspension 30 mL  30 mL Oral Daily PRN Penn, Cranston Neighbor, NP       OLANZapine (ZYPREXA) injection 5 mg  5 mg Intramuscular TID PRN Penn, Cicely, NP       OLANZapine zydis (ZYPREXA) disintegrating tablet 5 mg  5 mg Oral TID PRN Penn, Cranston Neighbor, NP       Vitamin D (Ergocalciferol) (DRISDOL) 1.25 MG (50000 UNIT) capsule 50,000 Units  50,000 Units Oral Weekly Verner Chol, MD   50,000 Units at 03/04/23 5621    Lab Results:  Results for orders placed or performed during the hospital encounter of 02/27/23 (from the past 48 hours)  Hemoglobin A1c     Status: None   Collection Time: 03/05/23  7:10 AM  Result Value Ref Range   Hgb A1c MFr Bld 5.6 4.8 - 5.6 %    Comment: (NOTE) Pre diabetes:          5.7%-6.4%  Diabetes:              >6.4%  Glycemic control for   <7.0% adults with diabetes    Mean Plasma Glucose 114.02 mg/dL    Comment: Performed at [facility], [address] [place], [place] [address]  Lipid panel     Status: Abnormal   Collection Time: 03/05/23  7:10 AM  Result  Value Ref Range   Cholesterol 193 0 - 200 mg/dL   Triglycerides 784 (H) <150 mg/dL   HDL 52 >69 mg/dL   Total CHOL/HDL Ratio 3.7 RATIO   VLDL 36 0 - 40 mg/dL   LDL Cholesterol 629 (H) 0 - 99 mg/dL    Comment:        Total Cholesterol/HDL:CHD  Risk Coronary Heart Disease Risk Table                     Men   Women  1/2 Average Risk   3.4   3.3  Average Risk       5.0   4.4  2 X Average Risk   9.6   7.1  3 X Average Risk  23.4   11.0        Use the calculated Patient Ratio above and the CHD Risk Table to determine the patient's CHD Risk.        ATP III CLASSIFICATION (LDL):  <100     mg/dL   Optimal  161-096  mg/dL   Near or Above                    Optimal  130-159  mg/dL   Borderline  045-409  mg/dL   High  >811     mg/dL   Very High Performed at [facility], [address] [place], [place] [address]     Blood Alcohol level:  Lab Results  Component Value Date   [facility] <10 02/27/2023   ETH <10 02/19/2023    Metabolic Disorder Labs: Lab Results  Component Value Date   HGBA1C 5.6 03/05/2023   MPG 114.02 03/05/2023   MPG 117 02/02/2009   No results found for: "PROLACTIN" Lab Results  Component Value Date   CHOL 193 03/05/2023   TRIG 178 (H) 03/05/2023   HDL 52 03/05/2023   CHOLHDL 3.7 03/05/2023   VLDL 36 03/05/2023   LDLCALC 105 (H) 03/05/2023   LDLCALC 80 10/31/2022    Psychiatric Specialty Exam:  Psychiatric Specialty Exam:   Presentation  General Appearance:   Casual   Eye Contact: Fair   Speech: Clear and Coherent   Speech Volume: Normal       Mood and Affect  Mood: " Little better"   Affect: Less constricted     Thought Process  Thought Processes: Coherent   Descriptions of Associations:Intact   Orientation:Full (Time, Place and Person)   Thought Content:Logical   Hallucinations:Hallucinations: None   Ideas of Reference:None   Suicidal Thoughts: Denies  Homicidal Thoughts:Homicidal Thoughts: No      Sensorium  Memory: Immediate Fair; Recent Fair; Remote Fair   Judgment: Improving   Insight: Improving     Executive Functions  Concentration: Fair   Attention Span: Fair   Recall: [employer] of Knowledge: Fair   Language: Fair     Psychomotor Activity  Psychomotor Activity: Psychomotor Activity: Normal  Musculoskeletal: Strength & Muscle Tone: within normal limits Gait & Station: normal Assets  Assets: Manufacturing systems engineer; Desire for Improvement; Physical Health    Physical Exam: Physical Exam Vitals and nursing note reviewed.  HENT:     Head: Normocephalic.     Nose: Nose normal.     Mouth/Throat:     Mouth: Mucous membranes are moist.  Eyes:     Pupils: Pupils are equal, round, and reactive to light.  Cardiovascular:     Rate and Rhythm: Normal rate.  Pulmonary:     Breath sounds: Normal breath sounds.  Abdominal:     General: Bowel sounds are normal.  Skin:    General: Skin is warm.  Neurological:     General: No focal deficit present.     Mental Status: He is alert.    Review of Systems  Constitutional: Negative.   HENT:  Negative.    Eyes: Negative.   Respiratory: Negative.    Cardiovascular: Negative.   Gastrointestinal: Negative.   Skin: Negative.   Neurological: Negative.    Blood pressure 125/71, pulse 62, temperature 99 F (37.2 C), resp. rate 18, height 5\' 10"  (1.778 m), weight 73.5 kg, SpO2 95%. Body mass index is 23.24 kg/m.  MDD (major depressive disorder), Recurrent severe with out psychosis Vs Substance induced mood disorder Alcohol use disorder  Stimulant use disorder     Clinical Decision Making: Patient with history of depression, polysubstance use presented today for worsening depression, suicidal ideation.  He endorses hopelessness and anhedonia with worsening anxiety.  Given polysubstance use is noted to be impulsive in nature.  He carries a moderate risk for suicide.  Patient is currently admitted to  geropsych inpatient unit.   Treatment Plan Summary:   Safety and Monitoring:             -- Voluntary admission to inpatient psychiatric unit for safety, stabilization and treatment             -- Daily contact with patient to assess and evaluate symptoms and progress in treatment             -- Patient's case to be discussed in multi-disciplinary team meeting             -- Observation Level: q15 minute checks             -- Vital signs:  q12 hours             -- Precautions: suicide, elopement, and assault   2. Psychiatric Diagnoses and Treatment:    Prozac 30 mg to optimize the treatment for depression.             Patient reports being on her Risperdal and Prozac Patient is not currently psychotic so will hold off Risperdal at this time     -- The risks/benefits/side-effects/alternatives to this medication were discussed in detail with the patient and time was given for questions. The patient consents to medication trial.                             3. Medical Issues Being Addressed:  No medical emergent needs noted   4. Discharge Planning:              -- Social work and case management to assist with discharge planning and identification of hospital follow-up needs prior to discharge             -- Estimated LOS: 4-5 days, patient awaiting bed at [facility]             -- Discharge Concerns: Need to establish a safety plan; Medication compliance and effectiveness             -- Discharge Goals: Return home with outpatient referrals follow ups    Long Term Goal(s): Improvement in symptoms so as ready for discharge   Short Term Goals: Ability to identify changes in lifestyle to reduce recurrence of condition will improve, Ability to verbalize feelings will improve, Ability to disclose and discuss suicidal ideas, Ability to demonstrate self-control will improve, Ability to identify and develop effective coping behaviors will improve, Ability to maintain clinical measurements within  normal limits will improve, Compliance with prescribed medications will improve, and Ability to identify triggers associated with substance abuse/mental health issues will improve  Lewanda Rife, MD

## 2023-03-06 NOTE — Group Note (Signed)
 LCSW Group Therapy Note   Group Date: 03/06/2023 Start Time: 1300 End Time: 1400   Type of Therapy and Topic:  Group Therapy:   Participation Level:  Did Not Attend  Description of Group:   Therapeutic Goals:  1.     Summary of Patient Progress:    Did not attend  Therapeutic Modalities:   Alanda Amass 03/06/2023  1:57 PM

## 2023-03-06 NOTE — Progress Notes (Signed)
   03/05/23 2200  Psych Admission Type (Psych Patients Only)  Admission Status Voluntary  Psychosocial Assessment  Patient Complaints Depression  Eye Contact Brief  Facial Expression Flat  Affect Sad  Speech Logical/coherent  Interaction Minimal  Motor Activity Slow  Appearance/Hygiene In scrubs  Behavior Characteristics Cooperative  Mood Depressed;Sad  Thought Process  Coherency WDL  Content WDL  Delusions None reported or observed  Perception WDL  Hallucination None reported or observed  Judgment Impaired  Confusion None  Danger to Self  Current suicidal ideation?  (Denies)  Agreement Not to Harm Self Yes  Description of Agreement Verbal  Danger to Others  Danger to Others None reported or observed

## 2023-03-06 NOTE — Progress Notes (Signed)
 Patient is pleasant and cooperative.  Flat affect.  Endorses depression.  Denies SI/HI and AVH.  Denies anxiety.  Denies pain.  Reports he slept well.   Compliant with scheduled medication. 15 min checks in place for safety.  Patient isolated to room after breakfast.  Present in the milieu this evening.  Appropriate interaction with peers.

## 2023-03-07 DIAGNOSIS — F332 Major depressive disorder, recurrent severe without psychotic features: Secondary | ICD-10-CM

## 2023-03-07 NOTE — Progress Notes (Signed)
   03/06/23 2200  Psych Admission Type (Psych Patients Only)  Admission Status Voluntary  Psychosocial Assessment  Patient Complaints Depression  Eye Contact Brief  Facial Expression Flat  Affect Flat  Speech Logical/coherent  Interaction Isolative  Motor Activity Slow  Appearance/Hygiene In scrubs  Behavior Characteristics Cooperative  Mood Depressed  Thought Process  Coherency WDL  Content WDL  Delusions None reported or observed  Perception WDL  Hallucination None reported or observed  Judgment Impaired  Confusion None  Danger to Self  Current suicidal ideation? Denies

## 2023-03-07 NOTE — BHH Counselor (Signed)
 CSW contacted [facility] , [phone] per pt's request to make sure they received fax from last week.   CSW spoke with front desk receptionist, she states that pt can call to complete pre-screen after 2:30 PM and ask for [employer].   CSW informed pt.   Pt reports he will call.    Reynaldo Minium, MSW, [place] 03/07/2023 2:12 PM

## 2023-03-07 NOTE — Group Note (Deleted)
 Recreation Therapy Group Note   Group Topic:Goal Setting  Group Date: 03/07/2023 Start Time: 1040 End Time: 1140 Facilitators: Rosina Lowenstein, LRT Location:  Craft Room  Group Description: Product/process development scientist. Patients were given many different magazines, a glue stick, markers, and a piece of cardstock paper. LRT and pts discussed the importance of having goals in life. LRT and pts discussed the difference between short-term and long-term goals, as well as what a SMART goal is. LRT encouraged pts to create a vision board, with images they picked and then cut out with safety scissors from the magazine, for themselves, that capture their short and long-term goals. LRT encouraged pts to show and explain their vision board to the group.   Goal Area(s) Addressed:  Patient will gain knowledge of short vs. long term goals.  Patient will identify goals for themselves. Patient will practice setting SMART goals. Patient will verbalize their goals to LRT and peers.       Affect/Mood: {RT BHH Affect/Mood:26271}   Participation Level: {RT BHH Participation Level:26267}   Participation Quality: {RT BHH Participation Quality:26268}   Behavior: {RT BHH Group Behavior:26269}   Speech/Thought Process: {RT BHH Speech/Thought:26276}   Insight: {RT BHH Insight:26272}   Judgement: {RT BHH Judgement:26278}   Modes of Intervention: {RT BHH Modes of Intervention:26277}   Patient Response to Interventions:  {RT BHH Patient Response to Intervention:26274}   Education Outcome:  {RT BHH Education Outcome:26279}   Clinical Observations/Individualized Feedback: *** was *** in their participation of session activities and group discussion. Pt identified ***   Plan: {RT BHH Tx VHQI:69629}   Rosina Lowenstein, LRT,  03/07/2023 12:58 PM

## 2023-03-07 NOTE — Progress Notes (Signed)
   03/07/23 1610  15 Minute Checks  Location Bedroom  Visual Appearance Calm  Behavior Sleeping  Sleep (Behavioral Health Patients Only)  Calculate sleep? (Click Yes once per 24 hr at 0600 safety check) Yes  Documented sleep last 24 hours 11.75

## 2023-03-07 NOTE — Group Note (Signed)
 Date:  03/07/2023 Time:  12:41 AM  Group Topic/Focus:  Wrap-Up Group:   The focus of this group is to help patients review their daily goal of treatment and discuss progress on daily workbooks.    Participation Level:  Active  Participation Quality:  Appropriate  Affect:  Appropriate  Cognitive:  Appropriate  Insight: Improving  Engagement in Group:  Engaged  Modes of Intervention:  Discussion  Additional Comments:    Maeola Harman 03/07/2023, 12:41 AM

## 2023-03-07 NOTE — Progress Notes (Signed)
   03/07/23 0711  Psych Admission Type (Psych Patients Only)  Admission Status Voluntary  Psychosocial Assessment  Patient Complaints Isolation  Eye Contact Fair  Facial Expression Flat  Affect Flat  Speech Logical/coherent  Interaction Isolative  Motor Activity Slow  Appearance/Hygiene In scrubs  Behavior Characteristics Cooperative  Mood Pleasant  Thought Process  Coherency WDL  Content WDL  Delusions None reported or observed  Perception WDL  Hallucination None reported or observed  Judgment Impaired  Confusion None  Danger to Self  Current suicidal ideation? Denies  Danger to Others  Danger to Others None reported or observed

## 2023-03-07 NOTE — Group Note (Signed)
 Recreation Therapy Group Note   Group Topic:Goal Setting  Group Date: 03/07/2023 Start Time: 1500 End Time: 1600 Facilitators: Rosina Lowenstein, LRT, CTRS Location:  Dayroom  Group Description: Product/process development scientist. Patients were given many different magazines, a glue stick, markers, and a piece of cardstock paper. LRT and pts discussed the importance of having goals in life. LRT and pts discussed the difference between short-term and long-term goals, as well as what a SMART goal is. LRT encouraged pts to create a vision board, with images they picked and then cut out with safety scissors from the magazine, for themselves, that capture their short and long-term goals. LRT encouraged pts to show and explain their vision board to the group.   Goal Area(s) Addressed:  Patient will gain knowledge of short vs. long term goals.  Patient will identify goals for themselves. Patient will practice setting SMART goals. Patient will verbalize their goals to LRT and peers.   Affect/Mood: Appropriate   Participation Level: Active and Engaged   Participation Quality: Independent   Behavior: Appropriate, Calm, and Cooperative   Speech/Thought Process: Coherent   Insight: Fair   Judgement: Fair    Modes of Intervention: Art, Education, Exploration, Guided Discussion, Socialization, and Support   Patient Response to Interventions:  Attentive and Receptive   Education Outcome:  Acknowledges education   Clinical Observations/Individualized Feedback: Tyrell was active in their participation of session activities and group discussion. Pt identified "I want to travel to [place], [place], and [place]" as his goals. Pt appropriately identified images to reflect these. Pt interacted well with LRT and peers duration of session.    Plan: Continue to engage patient in RT group sessions 2-3x/week.   Rosina Lowenstein, LRT, CTRS 03/07/2023 4:44 PM

## 2023-03-07 NOTE — Progress Notes (Signed)
 [facility] MD Progress Note  03/07/2023  Franklin [name]  MRN:  [id number]  Pierre Dellarocco is a 53 y.o. male presenting to [facility] emergency department on 02/27/2023 2:25 AM for suicidal ideations. Patient reports going to take some pills or either cut my wrist. Patient states I took some pills a few days ago but I guess it was not enough. He has a psychiatric history of major depressive disorder, suicidal ideation, cocaine use disorder, alcohol use disorder, tobacco abuse, and generalized anxiety disorder .  Subjective:  Chart reviewed, case discussed in multidisciplinary meeting, patient seen during rounds.  Per nursing report patient has attended few groups.  Today patient reports that he feels "Good".  He reports his sleep and appetite has improved.  Today he denies suicide thoughts.  He was encouraged to work with Child psychotherapist on [facility].  He denies auditory/visual hallucinations.  He denies side effects from medicine  Sleep: Fair  Appetite:  Fair  Past Psychiatric History: see h&P Family History:  Family History  Problem Relation Age of Onset   Hypertension Other    Diabetes Other    Social History:  Social History   Substance and Sexual Activity  Alcohol Use Yes   Alcohol/week: 12.0 standard drinks of alcohol   Types: 12 Cans of beer per week     Social History   Substance and Sexual Activity  Drug Use Yes   Types: "Crack" cocaine, Cocaine   Comment: one gram daily    Social History   Socioeconomic History   Marital status: Single    Spouse name: Not on file   Number of children: Not on file   Years of education: Not on file   Highest education level: Not on file  Occupational History   Occupation: [employer]  Tobacco Use   Smoking status: Some Days    Current packs/day: 0.50    Types: Cigarettes   Smokeless tobacco: Never  Vaping Use   Vaping status: Never Used  Substance and Sexual Activity   Alcohol use: Yes    Alcohol/week: 12.0 standard  drinks of alcohol    Types: 12 Cans of beer per week   Drug use: Yes    Types: "Crack" cocaine, Cocaine    Comment: one gram daily   Sexual activity: Yes  Other Topics Concern   Not on file  Social History Narrative   The patient was born and raised in with that by both his biological parents. His father's past way but his mother still living. He denies any history of any physical or sexual abuse. He says he completed 2 years of college at [employer]. 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 He has never been married but has a 62 year old son who lives with his mother. He says he does get to see his son and has a relationship with him. He is not currently dating or in a relationship.      The patient does have a history of a DUI and had a court date last Friday for a DUI. He denies any other pending charges.            Social Drivers of Corporate investment banker Strain: Not on file  Food Insecurity: Food Insecurity Present (02/27/2023)   Hunger Vital Sign    Worried About Running Out of Food in the Last Year: Sometimes true    Ran Out  of Food in the Last Year: Sometimes true  Transportation Needs: Unmet Transportation Needs (02/27/2023)   PRAPARE - Administrator, Civil Service (Medical): Yes    Lack of Transportation (Non-Medical): Yes  Physical Activity: Not on file  Stress: Not on file  Social Connections: Patient Declined (02/27/2023)   Social Connection and Isolation Panel [NHANES]    Frequency of Communication with Friends and Family: Patient declined    Frequency of Social Gatherings with Friends and Family: Patient declined    Attends Religious Services: Patient declined    Database administrator or Organizations: Patient declined    Attends Engineer, structural: Patient declined    Marital Status: Patient declined   Past Medical History:  Past Medical History:  Diagnosis  Date   Anxiety    COPD (chronic obstructive pulmonary disease) (HCC)    Depression    Substance abuse (HCC)     Past Surgical History:  Procedure Laterality Date   NO PAST SURGERIES      Current Medications: Current Facility-Administered Medications  Medication Dose Route Frequency Provider Last Rate Last Admin   acetaminophen (TYLENOL) tablet 650 mg  650 mg Oral Q6H PRN Penn, Cicely, NP       albuterol (PROVENTIL) (2.5 MG/3ML) 0.083% nebulizer solution 2.5 mg  2.5 mg Inhalation Q4H PRN Penn, Cicely, NP       alum & mag hydroxide-simeth (MAALOX/MYLANTA) 200-200-20 MG/5ML suspension 30 mL  30 mL Oral Q4H PRN Penn, Cicely, NP       FLUoxetine (PROZAC) capsule 30 mg  30 mg Oral Daily Verner Chol, MD   30 mg at 03/07/23 4098   magnesium hydroxide (MILK OF MAGNESIA) suspension 30 mL  30 mL Oral Daily PRN Penn, Cranston Neighbor, NP       OLANZapine (ZYPREXA) injection 5 mg  5 mg Intramuscular TID PRN Penn, Cicely, NP       OLANZapine zydis (ZYPREXA) disintegrating tablet 5 mg  5 mg Oral TID PRN Penn, Cicely, NP       Vitamin D (Ergocalciferol) (DRISDOL) 1.25 MG (50000 UNIT) capsule 50,000 Units  50,000 Units Oral Weekly Verner Chol, MD   50,000 Units at 03/04/23 1191    Lab Results:  No results found for this or any previous visit (from the past 48 hours).   Blood Alcohol level:  Lab Results  Component Value Date   ETH <10 02/27/2023   ETH <10 02/19/2023    Metabolic Disorder Labs: Lab Results  Component Value Date   HGBA1C 5.6 03/05/2023   MPG 114.02 03/05/2023   MPG 117 02/02/2009   No results found for: "PROLACTIN" Lab Results  Component Value Date   CHOL 193 03/05/2023   TRIG 178 (H) 03/05/2023   HDL 52 03/05/2023   CHOLHDL 3.7 03/05/2023   VLDL 36 03/05/2023   LDLCALC 105 (H) 03/05/2023   LDLCALC 80 10/31/2022    Psychiatric Specialty Exam:   Presentation  General Appearance:   Casual   Eye Contact: Fair   Speech: Clear and Coherent   Speech  Volume: Normal       Mood and Affect  Mood: " Good"   Affect: More animated   Thought Process  Thought Processes: Coherent   Descriptions of Associations:Intact   Orientation:Full (Time, Place and Person)   Thought Content:Logical   Hallucinations:Hallucinations: None   Ideas of Reference:None   Suicidal Thoughts: Denies  Homicidal Thoughts:Homicidal Thoughts: No     Sensorium  Memory: Immediate Fair; Recent Fair;  Remote Fair   Judgment: Improving   Insight: Improving     Executive Functions  Concentration: Fair   Attention Span: Fair   Recall: [employer] of Knowledge: Fair   Language: Fair     Psychomotor Activity  Psychomotor Activity: Psychomotor Activity: Normal  Musculoskeletal: Strength & Muscle Tone: within normal limits Gait & Station: normal Assets  Assets: Manufacturing systems engineer; Desire for Improvement; Physical Health    Physical Exam: Physical Exam Vitals and nursing note reviewed.  HENT:     Head: Normocephalic.     Nose: No congestion.     Mouth/Throat:     Mouth: Mucous membranes are moist.  Eyes:     Pupils: Pupils are equal, round, and reactive to light.  Cardiovascular:     Rate and Rhythm: Normal rate.  Pulmonary:     Breath sounds: Normal breath sounds.  Abdominal:     General: Bowel sounds are normal.  Skin:    General: Skin is warm.  Neurological:     General: No focal deficit present.     Mental Status: He is alert.    Review of Systems  Constitutional: Negative.   HENT: Negative.    Eyes: Negative.   Respiratory: Negative.    Cardiovascular: Negative.   Gastrointestinal: Negative.   Skin: Negative.   Neurological: Negative.    Blood pressure 125/71, pulse 62, temperature 99 F (37.2 C), resp. rate 18, height 5\' 10"  (1.778 m), weight 73.5 kg, SpO2 95%. Body mass index is 23.24 kg/m.  MDD (major depressive disorder), Recurrent severe with out psychosis Vs Substance induced mood  disorder Alcohol use disorder  Stimulant use disorder     Clinical Decision Making: Patient with history of depression, polysubstance use presented today for worsening depression, suicidal ideation.  He endorses hopelessness and anhedonia with worsening anxiety.  Given polysubstance use is noted to be impulsive in nature.  He carries a moderate risk for suicide.  Patient is currently admitted to geropsych inpatient unit.   Treatment Plan Summary:   Safety and Monitoring:             -- Voluntary admission to inpatient psychiatric unit for safety, stabilization and treatment             -- Daily contact with patient to assess and evaluate symptoms and progress in treatment             -- Patient's case to be discussed in multi-disciplinary team meeting             -- Observation Level: q15 minute checks             -- Vital signs:  q12 hours             -- Precautions: suicide, elopement, and assault   2. Psychiatric Diagnoses and Treatment:    Prozac 30 mg to optimize the treatment for depression.             Patient reports being on her Risperdal and Prozac Patient is not currently psychotic so will hold off Risperdal at this time     -- The risks/benefits/side-effects/alternatives to this medication were discussed in detail with the patient and time was given for questions. The patient consents to medication trial.                             3. Medical Issues Being Addressed:  No medical emergent needs noted  4. Discharge Planning:              -- Social work and case management to assist with discharge planning and identification of hospital follow-up needs prior to discharge             -- Estimated LOS:2-3 days, patient awaiting bed at [facility]             -- Discharge Concerns: Need to establish a safety plan;    Long Term Goal(s): Improvement in symptoms so as ready for discharge   Short Term Goals: Ability to identify changes in lifestyle to reduce recurrence of condition  will improve, Ability to verbalize feelings will improve, Ability to disclose and discuss suicidal ideas, Ability to demonstrate self-control will improve, Ability to identify and develop effective coping behaviors will improve, Ability to maintain clinical measurements within normal limits will improve, Compliance with prescribed medications will improve, and Ability to identify triggers associated with substance abuse/mental health issues will improve  Lewanda Rife, MD

## 2023-03-07 NOTE — Plan of Care (Signed)

## 2023-03-07 NOTE — Progress Notes (Signed)
 Pt is alert and oriented. Isolates in room. Did not attend group. Endorses depression. Q15 min checks maintained for safety. Denies SI/HI. No c/o pain/discomfort noted. Pt remains safe on the unit.

## 2023-03-08 DIAGNOSIS — F332 Major depressive disorder, recurrent severe without psychotic features: Secondary | ICD-10-CM | POA: Diagnosis not present

## 2023-03-08 NOTE — Progress Notes (Signed)
 [facility] MD Progress Note  03/08/2023  Al Bracewell  MRN:  [id number]  Rik Wadel is a 53 y.o. male presenting to [facility] emergency department on 02/27/2023 2:25 AM for suicidal ideations. Patient reports going to take some pills or either cut my wrist. Patient states I took some pills a few days ago but I guess it was not enough. He has a psychiatric history of major depressive disorder, suicidal ideation, cocaine use disorder, alcohol use disorder, tobacco abuse, and generalized anxiety disorder .  Subjective:  Chart reviewed, case discussed in multidisciplinary meeting, patient seen during rounds.  No new acute events reported overnight.  Per nursing report patient has attended few groups.  Patient continues to report improvement in mood.  He reports his sleep and appetite has improved.  He denies suicidal and homicidal ideation.  He is working with Child psychotherapist on [facility].  He denies auditory/visual hallucinations.  He denies side effects from medicine  Sleep: Fair  Appetite:  Fair  Past Psychiatric History: see h&P Family History:  Family History  Problem Relation Age of Onset   Hypertension Other    Diabetes Other    Social History:  Social History   Substance and Sexual Activity  Alcohol Use Yes   Alcohol/week: 12.0 standard drinks of alcohol   Types: 12 Cans of beer per week     Social History   Substance and Sexual Activity  Drug Use Yes   Types: "Crack" cocaine, Cocaine   Comment: one gram daily    Social History   Socioeconomic History   Marital status: Single    Spouse name: Not on file   Number of children: Not on file   Years of education: Not on file   Highest education level: Not on file  Occupational History   Occupation: [employer]  Tobacco Use   Smoking status: Some Days    Current packs/day: 0.50    Types: Cigarettes   Smokeless tobacco: Never  Vaping Use   Vaping status: Never Used  Substance and Sexual Activity    Alcohol use: Yes    Alcohol/week: 12.0 standard drinks of alcohol    Types: 12 Cans of beer per week   Drug use: Yes    Types: "Crack" cocaine, Cocaine    Comment: one gram daily   Sexual activity: Yes  Other Topics Concern   Not on file  Social History Narrative   The patient was born and raised in with that by both his biological parents. His father's past way but his mother still living. He denies any history of any physical or sexual abuse. He says he completed 2 years of college at [employer]. 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 He has never been married but has a 61 year old son who lives with his mother. He says he does get to see his son and has a relationship with him. He is not currently dating or in a relationship.      The patient does have a history of a DUI and had a court date last Friday for a DUI. He denies any other pending charges.            Social Drivers of Corporate investment banker Strain: Not on file  Food Insecurity: Food Insecurity Present (02/27/2023)   Hunger Vital Sign    Worried About Running Out of Food in the Last Year: Sometimes  true    Ran Out of Food in the Last Year: Sometimes true  Transportation Needs: Unmet Transportation Needs (02/27/2023)   PRAPARE - Administrator, Civil Service (Medical): Yes    Lack of Transportation (Non-Medical): Yes  Physical Activity: Not on file  Stress: Not on file  Social Connections: Patient Declined (02/27/2023)   Social Connection and Isolation Panel [NHANES]    Frequency of Communication with Friends and Family: Patient declined    Frequency of Social Gatherings with Friends and Family: Patient declined    Attends Religious Services: Patient declined    Database administrator or Organizations: Patient declined    Attends Engineer, structural: Patient declined    Marital Status: Patient declined   Past  Medical History:  Past Medical History:  Diagnosis Date   Anxiety    COPD (chronic obstructive pulmonary disease) (HCC)    Depression    Substance abuse (HCC)     Past Surgical History:  Procedure Laterality Date   NO PAST SURGERIES      Current Medications: Current Facility-Administered Medications  Medication Dose Route Frequency Provider Last Rate Last Admin   acetaminophen (TYLENOL) tablet 650 mg  650 mg Oral Q6H PRN Penn, Cicely, NP       albuterol (PROVENTIL) (2.5 MG/3ML) 0.083% nebulizer solution 2.5 mg  2.5 mg Inhalation Q4H PRN Penn, Cicely, NP       alum & mag hydroxide-simeth (MAALOX/MYLANTA) 200-200-20 MG/5ML suspension 30 mL  30 mL Oral Q4H PRN Penn, Cicely, NP       FLUoxetine (PROZAC) capsule 30 mg  30 mg Oral Daily Verner Chol, MD   30 mg at 03/08/23 0800   magnesium hydroxide (MILK OF MAGNESIA) suspension 30 mL  30 mL Oral Daily PRN Penn, Cranston Neighbor, NP       OLANZapine (ZYPREXA) injection 5 mg  5 mg Intramuscular TID PRN Penn, Cicely, NP       OLANZapine zydis (ZYPREXA) disintegrating tablet 5 mg  5 mg Oral TID PRN Penn, Cicely, NP       Vitamin D (Ergocalciferol) (DRISDOL) 1.25 MG (50000 UNIT) capsule 50,000 Units  50,000 Units Oral Weekly Verner Chol, MD   50,000 Units at 03/04/23 1610    Lab Results:  No results found for this or any previous visit (from the past 48 hours).   Blood Alcohol level:  Lab Results  Component Value Date   ETH <10 02/27/2023   ETH <10 02/19/2023    Metabolic Disorder Labs: Lab Results  Component Value Date   HGBA1C 5.6 03/05/2023   MPG 114.02 03/05/2023   MPG 117 02/02/2009   No results found for: "PROLACTIN" Lab Results  Component Value Date   CHOL 193 03/05/2023   TRIG 178 (H) 03/05/2023   HDL 52 03/05/2023   CHOLHDL 3.7 03/05/2023   VLDL 36 03/05/2023   LDLCALC 105 (H) 03/05/2023   LDLCALC 80 10/31/2022    Psychiatric Specialty Exam:   Presentation  General Appearance:   Casual   Eye Contact: Fair    Speech: Clear and Coherent   Speech Volume: Normal       Mood and Affect  Mood: " Good"   Affect: More animated   Thought Process  Thought Processes: Coherent   Descriptions of Associations:Intact   Orientation:Full (Time, Place and Person)   Thought Content:Logical   Hallucinations:Hallucinations: None   Ideas of Reference:None   Suicidal Thoughts: Denies  Homicidal Thoughts:Homicidal Thoughts: No     Sensorium  Memory: Immediate Fair; Recent Fair; Remote Fair   Judgment: Improving   Insight: Improving     Executive Functions  Concentration: Fair   Attention Span: Fair   Recall: [employer] of Knowledge: Fair   Language: Fair     Psychomotor Activity  Psychomotor Activity: Psychomotor Activity: Normal  Musculoskeletal: Strength & Muscle Tone: within normal limits Gait & Station: normal Assets  Assets: Manufacturing systems engineer; Desire for Improvement; Physical Health    Physical Exam: Physical Exam Vitals and nursing note reviewed.  HENT:     Head: Normocephalic.     Nose: No congestion.     Mouth/Throat:     Mouth: Mucous membranes are moist.  Eyes:     Pupils: Pupils are equal, round, and reactive to light.  Cardiovascular:     Rate and Rhythm: Normal rate.  Pulmonary:     Breath sounds: Normal breath sounds.  Abdominal:     General: Bowel sounds are normal.  Skin:    General: Skin is warm.  Neurological:     General: No focal deficit present.     Mental Status: He is alert.    Review of Systems  Constitutional: Negative.   HENT: Negative.    Eyes: Negative.   Respiratory: Negative.    Cardiovascular: Negative.   Gastrointestinal: Negative.   Skin: Negative.   Neurological: Negative.    Blood pressure 118/75, pulse (!) 56, temperature 99.1 F (37.3 C), resp. rate 18, height 5\' 10"  (1.778 m), weight 73.5 kg, SpO2 98%. Body mass index is 23.24 kg/m.  MDD (major depressive disorder), Recurrent severe with out  psychosis Vs Substance induced mood disorder Alcohol use disorder  Stimulant use disorder     Clinical Decision Making: Patient with history of depression, polysubstance use presented today for worsening depression, suicidal ideation.  He endorses hopelessness and anhedonia with worsening anxiety.  Given polysubstance use is noted to be impulsive in nature.  He carries a moderate risk for suicide.  Patient is currently admitted to geropsych inpatient unit.   Treatment Plan Summary:   Safety and Monitoring:             -- Voluntary admission to inpatient psychiatric unit for safety, stabilization and treatment             -- Daily contact with patient to assess and evaluate symptoms and progress in treatment             -- Patient's case to be discussed in multi-disciplinary team meeting             -- Observation Level: q15 minute checks             -- Vital signs:  q12 hours             -- Precautions: suicide, elopement, and assault   2. Psychiatric Diagnoses and Treatment:    Prozac 30 mg  for depression.             Patient reports being on her Risperdal and Prozac Patient is not currently psychotic so will hold off Risperdal at this time     -- The risks/benefits/side-effects/alternatives to this medication were discussed in detail with the patient and time was given for questions. The patient consents to medication trial.                             3. Medical Issues Being Addressed:  No medical emergent  needs noted   4. Discharge Planning:              -- Social work and case management to assist with discharge planning and identification of hospital follow-up needs prior to discharge             -- Estimated LOS:2-3 days, patient awaiting bed at [facility]             -- Discharge Concerns: Need to establish a safety plan;     Lewanda Rife, MD

## 2023-03-08 NOTE — BHH Counselor (Signed)
 Pt requested that CSW contact his lawyer, Ethelene Browns to get a letter of continuation of his court case so that he can go to [facility].   CSW contacted [employer] office.   CSW was transferred to Elliot's desk-line.   CSW left VM requesting return call.   Reynaldo Minium, MSW, [place] 03/08/2023 1:24 PM

## 2023-03-08 NOTE — Group Note (Signed)
 Recreation Therapy Group Note   Group Topic:Coping Skills  Group Date: 03/08/2023 Start Time: 1500 End Time: 1545 Facilitators: Rosina Lowenstein, LRT, CTRS Location: Courtyard  Group Description: Music Reminisce. LRT encouraged patients to think of their favorite song(s) that reminded them of a positive memory or time in their life. LRT encouraged patient to talk about that memory aloud to the group. LRT played the song through a speaker for all to hear. LRT and patients discussed how thinking of a positive memory or time in their life can be used as a coping skill in everyday life post discharge.   Goal Area(s) Addressed: Patient will increase verbal communication by conversing with peers. Patient will contribute to group discussion with minimal prompting. Patient will reminisce a positive memory or moment in their life.    Affect/Mood: N/A   Participation Level: Did not attend    Clinical Observations/Individualized Feedback: Patient did not attend group.   Plan: Continue to engage patient in RT group sessions 2-3x/week.   Rosina Lowenstein, LRT, CTRS 03/08/2023 5:16 PM

## 2023-03-08 NOTE — Group Note (Signed)
 Date:  03/08/2023 Time:  10:23 AM  Group Topic/Focus:  Goals Group:   The focus of this group is to help patients establish daily goals to achieve during treatment and discuss how the patient can incorporate goal setting into their daily lives to aide in recovery.    Participation Level:  Active  Participation Quality:  Appropriate  Affect:  Appropriate  Cognitive:  Appropriate  Insight: Appropriate  Engagement in Group:  Engaged  Modes of Intervention:  Discussion   Ardelle Anton 03/08/2023, 10:23 AM

## 2023-03-08 NOTE — Progress Notes (Signed)
   03/08/23 0200  Psych Admission Type (Psych Patients Only)  Admission Status Voluntary  Psychosocial Assessment  Patient Complaints Isolation  Eye Contact Fair  Facial Expression Flat  Affect Flat  Speech Logical/coherent  Interaction Isolative  Motor Activity Slow  Appearance/Hygiene In scrubs  Behavior Characteristics Cooperative  Mood Pleasant  Thought Process  Coherency WDL  Content WDL  Delusions None reported or observed  Perception WDL  Hallucination None reported or observed  Judgment WDL  Confusion None  Danger to Self  Current suicidal ideation? Denies  Danger to Others  Danger to Others None reported or observed

## 2023-03-08 NOTE — Group Note (Signed)
 Date:  03/08/2023 Time:  11:32 PM  Group Topic/Focus:  Wrap-Up Group:   The focus of this group is to help patients review their daily goal of treatment and discuss progress on daily workbooks.    Participation Level:  Active  Participation Quality:  Appropriate  Affect:  Appropriate  Cognitive:  Appropriate  Insight: Appropriate  Engagement in Group:  Engaged  Modes of Intervention:  Discussion  Additional Comments:    Franklin [name] 03/08/2023, 11:32 PM

## 2023-03-08 NOTE — B[facility] Counselor (Signed)
 CSW gave pt phone number for [facility], [phone], per his request.   Reynaldo Minium, MSW, [facility] 03/08/2023 1:24 PM

## 2023-03-08 NOTE — Group Note (Signed)
 Recreation Therapy Group Note   Group Topic:Health and Wellness  Group Date: 03/08/2023 Start Time: 1100 End Time: 1130 Facilitators: Rosina Lowenstein, LRT, CTRS Location:  Dayroom  Group Description: Seated Exercise. LRT discussed the mental and physical benefits of exercise. LRT and group discussed how physical activity can be used as a coping skill. Pt's and LRT followed along to an exercise video on the TV screen that provided a visual representation and audio description of every exercise performed. Pt's encouraged to listen to their bodies and stop at any time if they experience feelings of discomfort or pain. Pts were encouraged to drink water and stay hydrated.   Goal Area(s) Addressed: Patient will learn benefits of physical activity. Patient will identify exercise as a coping skill.  Patient will follow multistep directions. Patient will try a new leisure interest.    Affect/Mood: Appropriate   Participation Level: Active and Engaged   Participation Quality: Independent   Behavior: Appropriate   Speech/Thought Process: Coherent   Insight: Fair   Judgement: Fair    Modes of Intervention: Activity   Patient Response to Interventions:  Receptive   Education Outcome:  Acknowledges education   Clinical Observations/Individualized Feedback: Franklin [name] was active in their participation of session activities and group discussion. Pt completed all exercises as shown. Pt interacted well with LRT and peers duration of session.    Plan: Continue to engage patient in RT group sessions 2-3x/week.   Rosina Lowenstein, LRT, CTRS 03/08/2023 12:29 PM

## 2023-03-08 NOTE — Plan of Care (Signed)
   Problem: Clinical Measurements: Goal: Ability to maintain clinical measurements within normal limits will improve Outcome: Progressing Goal: Will remain free from infection Outcome: Progressing Goal: Respiratory complications will improve Outcome: Progressing Goal: Cardiovascular complication will be avoided Outcome: Progressing   Problem: Activity: Goal: Risk for activity intolerance will decrease Outcome: Progressing

## 2023-03-08 NOTE — Plan of Care (Signed)
  Problem: Education: Goal: Knowledge of General Education information will improve Description: Including pain rating scale, medication(s)/side effects and non-pharmacologic comfort measures Outcome: Progressing   Problem: Health Behavior/Discharge Planning: Goal: Ability to manage health-related needs will improve Outcome: Progressing   Problem: Clinical Measurements: Goal: Ability to maintain clinical measurements within normal limits will improve 03/08/2023 0701 by Lahoma Crocker, RN Outcome: Progressing 03/08/2023 0700 by Lahoma Crocker, RN Outcome: Progressing Goal: Will remain free from infection 03/08/2023 0701 by Lahoma Crocker, RN Outcome: Progressing 03/08/2023 0700 by Lahoma Crocker, RN Outcome: Progressing Goal: Diagnostic test results will improve Outcome: Progressing Goal: Respiratory complications will improve Outcome: Progressing Goal: Cardiovascular complication will be avoided Outcome: Progressing

## 2023-03-08 NOTE — Group Note (Signed)
 Date:  03/08/2023 Time:  11:18 PM  Group Topic/Focus:  Wrap-Up Group:   The focus of this group is to help patients review their daily goal of treatment and discuss progress on daily workbooks.    Participation Level:  Active  Participation Quality:  Appropriate  Affect:  Appropriate  Cognitive:  Appropriate  Insight: Appropriate  Engagement in Group:  Engaged  Modes of Intervention:  Discussion  Additional Comments:    Franklin [name] 03/08/2023, 11:18 PM

## 2023-03-08 NOTE — Progress Notes (Signed)
   03/08/23 0715  Psych Admission Type (Psych Patients Only)  Admission Status Voluntary  Psychosocial Assessment  Patient Complaints None  Eye Contact Fair  Facial Expression Flat  Affect Flat  Speech Logical/coherent  Interaction Isolative  Motor Activity Slow  Appearance/Hygiene In scrubs  Behavior Characteristics Cooperative;Calm  Mood Pleasant  Thought Process  Coherency WDL  Content WDL  Delusions None reported or observed  Perception WDL  Hallucination None reported or observed  Judgment Impaired  Confusion None  Danger to Self  Current suicidal ideation? Denies  Danger to Others  Danger to Others None reported or observed

## 2023-03-09 DIAGNOSIS — F332 Major depressive disorder, recurrent severe without psychotic features: Secondary | ICD-10-CM | POA: Diagnosis not present

## 2023-03-09 NOTE — Progress Notes (Signed)
   03/09/23 0300  Charting Type  Charting Type Shift assessment  Safety Check Verification  Has the RN verified the 15 minute safety check completion? Yes  Neurological  Neuro (WDL) WDL  HEENT  HEENT (WDL) WDL  Respiratory  Respiratory (WDL) WDL  Cardiac  Cardiac (WDL) WDL  Vascular  Vascular (WDL) WDL  Integumentary  Integumentary (WDL) WDL  Braden Scale (Ages 8 and up)  Sensory Perceptions 4  Moisture 4  Activity 4  Mobility 4  Nutrition 3  Friction and Shear 3  Braden Scale Score 22  Musculoskeletal  Musculoskeletal (WDL) WDL  Gastrointestinal  Gastrointestinal (WDL) WDL  GU Assessment  Genitourinary (WDL) WDL  Neurological  Level of Consciousness Alert

## 2023-03-09 NOTE — BHH Counselor (Signed)
 CSW contacted the office of [facility] Defender's office [phone]) to get in contact with pt's lawyer. This is the second attempt.    CSW unable to reach, CSW unable to LVM requesting return call  Reynaldo Minium, MSW, [facility] 03/09/2023 3:14 PM

## 2023-03-09 NOTE — Progress Notes (Signed)
 [facility] MD Progress Note  03/09/2023  [name]  MRN:  [id number]  Franklin [name] is a 53 y.o. male presenting to [facility] emergency department on 02/27/2023 2:25 AM for suicidal ideations. Patient reports going to take some pills or either cut my wrist. Patient states I took some pills a few days ago but I guess it was not enough. He has a psychiatric history of major depressive disorder, suicidal ideation, cocaine use disorder, alcohol use disorder, tobacco abuse, and generalized anxiety disorder .  Subjective:  Chart reviewed, case discussed in multidisciplinary meeting, patient seen during rounds.  No new acute events reported overnight.  He is working with Child psychotherapist on [place]. Patient continues to report improvement in mood.  He reports his sleep and appetite has improved.  He denies suicidal and homicidal ideation.  He denies auditory/visual hallucinations.  He denies side effects from medicine  Sleep: Fair  Appetite:  Fair  Past Psychiatric History: see h&P Family History:  Family History  Problem Relation Age of Onset   Hypertension Other    Diabetes Other    Social History:  Social History   Substance and Sexual Activity  Alcohol Use Yes   Alcohol/week: 12.0 standard drinks of alcohol   Types: 12 Cans of beer per week     Social History   Substance and Sexual Activity  Drug Use Yes   Types: "Crack" cocaine, Cocaine   Comment: one gram daily    Social History   Socioeconomic History   Marital status: Single    Spouse name: Not on file   Number of children: Not on file   Years of education: Not on file   Highest education level: Not on file  Occupational History   Occupation: [employer]  Tobacco Use   Smoking status: Some Days    Current packs/day: 0.50    Types: Cigarettes   Smokeless tobacco: Never  Vaping Use   Vaping status: Never Used  Substance and Sexual Activity   Alcohol use: Yes    Alcohol/week: 12.0 standard drinks of  alcohol    Types: 12 Cans of beer per week   Drug use: Yes    Types: "Crack" cocaine, Cocaine    Comment: one gram daily   Sexual activity: Yes  Other Topics Concern   Not on file  Social History Narrative   The patient was born and raised in with that by both his biological parents. His father's past way but his mother still living. He denies any history of any physical or sexual abuse. He says he completed 2 years of college at [employer]. 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 He has never been married but has a 80 year old son who lives with his mother. He says he does get to see his son and has a relationship with him. He is not currently dating or in a relationship.      The patient does have a history of a DUI and had a court date last Friday for a DUI. He denies any other pending charges.            Social Drivers of Corporate investment banker Strain: Not on file  Food Insecurity: Food Insecurity Present (02/27/2023)   Hunger Vital Sign    Worried About Running Out of Food in the Last Year: Sometimes true    Ran Out of Food in the  Last Year: Sometimes true  Transportation Needs: Unmet Transportation Needs (02/27/2023)   PRAPARE - Transportation    Lack of Transportation (Medical): Yes    Lack of Transportation (Non-Medical): Yes  Physical Activity: Not on file  Stress: Not on file  Social Connections: Patient Declined (02/27/2023)   Social Connection and Isolation Panel [NHANES]    Frequency of Communication with Friends and Family: Patient declined    Frequency of Social Gatherings with Friends and Family: Patient declined    Attends Religious Services: Patient declined    Database administrator or Organizations: Patient declined    Attends Engineer, structural: Patient declined    Marital Status: Patient declined   Past Medical History:  Past Medical History:  Diagnosis Date    Anxiety    COPD (chronic obstructive pulmonary disease) (HCC)    Depression    Substance abuse (HCC)     Past Surgical History:  Procedure Laterality Date   NO PAST SURGERIES      Current Medications: Current Facility-Administered Medications  Medication Dose Route Frequency Provider Last Rate Last Admin   acetaminophen (TYLENOL) tablet 650 mg  650 mg Oral Q6H PRN Penn, Cicely, NP       albuterol (PROVENTIL) (2.5 MG/3ML) 0.083% nebulizer solution 2.5 mg  2.5 mg Inhalation Q4H PRN Penn, Cicely, NP       alum & mag hydroxide-simeth (MAALOX/MYLANTA) 200-200-20 MG/5ML suspension 30 mL  30 mL Oral Q4H PRN Penn, Cicely, NP       FLUoxetine (PROZAC) capsule 30 mg  30 mg Oral Daily Verner Chol, MD   30 mg at 03/09/23 0848   magnesium hydroxide (MILK OF MAGNESIA) suspension 30 mL  30 mL Oral Daily PRN Penn, Cranston Neighbor, NP       OLANZapine (ZYPREXA) injection 5 mg  5 mg Intramuscular TID PRN Penn, Cicely, NP       OLANZapine zydis (ZYPREXA) disintegrating tablet 5 mg  5 mg Oral TID PRN Penn, Cicely, NP       Vitamin D (Ergocalciferol) (DRISDOL) 1.25 MG (50000 UNIT) capsule 50,000 Units  50,000 Units Oral Weekly Verner Chol, MD   50,000 Units at 03/04/23 5284      Blood Alcohol level:  Lab Results  Component Value Date   ETH <10 02/27/2023   ETH <10 02/19/2023    Metabolic Disorder Labs: Lab Results  Component Value Date   HGBA1C 5.6 03/05/2023   MPG 114.02 03/05/2023   MPG 117 02/02/2009   No results found for: "PROLACTIN" Lab Results  Component Value Date   CHOL 193 03/05/2023   TRIG 178 (H) 03/05/2023   HDL 52 03/05/2023   CHOLHDL 3.7 03/05/2023   VLDL 36 03/05/2023   LDLCALC 105 (H) 03/05/2023   LDLCALC 80 10/31/2022    Psychiatric Specialty Exam:   Presentation  General Appearance:   Casual   Eye Contact: Fair   Speech: Clear and Coherent   Speech Volume: Normal       Mood and Affect  Mood: " Good"   Affect: Stable and animated   Thought  Process  Thought Processes: Coherent   Descriptions of Associations:Intact   Orientation:Full (Time, Place and Person)   Thought Content:Logical   Hallucinations:Hallucinations: None   Ideas of Reference:None   Suicidal Thoughts: Denies  Homicidal Thoughts:Homicidal Thoughts: No     Sensorium  Memory: Immediate Fair; Recent Fair; Remote Fair   Judgment: Improved   Insight: Improved     Executive Functions  Concentration: Fair  Attention Span: Fair   Recall: [employer] of Knowledge: Fair   Language: Fair     Psychomotor Activity  Psychomotor Activity: Psychomotor Activity: Normal  Musculoskeletal: Strength & Muscle Tone: within normal limits Gait & Station: normal Assets  Assets: Manufacturing systems engineer; Desire for Improvement; Physical Health    Physical Exam: Physical Exam Vitals and nursing note reviewed.  HENT:     Head: Normocephalic.     Nose: No congestion.     Mouth/Throat:     Mouth: Mucous membranes are moist.  Eyes:     Pupils: Pupils are equal, round, and reactive to light.  Cardiovascular:     Rate and Rhythm: Normal rate.  Pulmonary:     Breath sounds: Normal breath sounds.  Abdominal:     General: Bowel sounds are normal.  Skin:    General: Skin is warm.  Neurological:     General: No focal deficit present.     Mental Status: He is alert.    Review of Systems  Constitutional: Negative.   HENT: Negative.    Eyes: Negative.   Respiratory: Negative.    Cardiovascular: Negative.   Gastrointestinal: Negative.   Skin: Negative.   Neurological: Negative.    Blood pressure 110/72, pulse (!) 58, temperature 98.4 F (36.9 C), resp. rate 14, height 5\' 10"  (1.778 m), weight 73.5 kg, SpO2 97%. Body mass index is 23.24 kg/m.  MDD (major depressive disorder), Recurrent severe with out psychosis Vs Substance induced mood disorder Alcohol use disorder  Stimulant use disorder     Clinical Decision Making: Patient with  history of depression, polysubstance use presented today for worsening depression, suicidal ideation.  He endorses hopelessness and anhedonia with worsening anxiety.  Given polysubstance use is noted to be impulsive in nature.  He carries a moderate risk for suicide.  Patient is currently admitted to geropsych inpatient unit.   Treatment Plan Summary:   Safety and Monitoring:             -- Voluntary admission to inpatient psychiatric unit for safety, stabilization and treatment             -- Daily contact with patient to assess and evaluate symptoms and progress in treatment             -- Patient's case to be discussed in multi-disciplinary team meeting             -- Observation Level: q15 minute checks             -- Vital signs:  q12 hours             -- Precautions: suicide, elopement, and assault   2. Psychiatric Diagnoses and Treatment:    Prozac 30 mg  for depression.             Patient reports being on her Risperdal and Prozac Patient is not currently psychotic so will hold off Risperdal at this time     -- The risks/benefits/side-effects/alternatives to this medication were discussed in detail with the patient and time was given for questions. The patient consents to medication trial.                             3. Medical Issues Being Addressed:  No medical emergent needs noted   4. Discharge Planning:              -- Social work and case management  to assist with discharge planning and identification of hospital follow-up needs prior to discharge             -- Estimated LOS:2-3 days, patient awaiting bed at [facility]             -- Discharge Concerns: Placement   Lewanda Rife, MD

## 2023-03-09 NOTE — Plan of Care (Signed)
  Problem: Health Behavior/Discharge Planning: Goal: Identification of resources available to assist in meeting health care needs will improve Outcome: Progressing   Problem: Physical Regulation: Goal: Complications related to the disease process, condition or treatment will be avoided or minimized Outcome: Progressing   Problem: Safety: Goal: Ability to remain free from injury will improve Outcome: Progressing  Patient is isolative to his room denies SI/HI/A/VH and verbally contracts for safety. Patient encouraged to attend programming. Support ongoing. Q 15 minutes safety checks ongoing.

## 2023-03-09 NOTE — Group Note (Signed)
 Date:  03/09/2023 Time:  8:45 PM  Group Topic/Focus:  Wrap-Up Group:   The focus of this group is to help patients review their daily goal of treatment and discuss progress on daily workbooks.    Participation Level:  Active  Participation Quality:  Appropriate  Affect:  Appropriate  Cognitive:  Appropriate  Insight: Appropriate  Engagement in Group:  Engaged  Modes of Intervention:  Discussion    [name] 03/09/2023, 8:45 PM

## 2023-03-09 NOTE — BHH Counselor (Signed)
 CSW contacted  [facility] [phone]) per pt's request to discuss his transtion with intake coordinator.    Would need an assessment from [place], Won't have beds available to until the last week of March, first week April.    Fax: [phone]

## 2023-03-09 NOTE — Plan of Care (Signed)
 D: Pt alert and oriented. Pt reports experiencing anxiety/depression at this time. Pt reports anxiety experienced is "only a little". Pt denies experiencing any pain at this time. Pt denies experiencing any SI/HI, or AVH at this time.   A: Scheduled medications administered to pt, per MD orders. Support and encouragement provided. Frequent verbal contact made. Routine safety checks conducted q15 minutes.   R: No adverse drug reactions noted. Pt verbally contracts for safety at this time. Pt compliant with medications and treatment plan. Pt interacts well but minimally with others on the unit. Pt remains safe at this time. Plan of care ongoing.  Problem: Education: Goal: Knowledge of General Education information will improve Description: Including pain rating scale, medication(s)/side effects and non-pharmacologic comfort measures Outcome: Progressing   Problem: Coping: Goal: Level of anxiety will decrease Outcome: Not Progressing

## 2023-03-09 NOTE — Progress Notes (Addendum)
 Physical Therapy Group Note  Group Topic:  Exercise/Activity Goals and Guidelines Group Date: 03/09/2023 Group Time (start and end): 13:00-13:36 Facilitators:  D. Elly Modena PT, DPT  Group Description: Group reviewed definition of physical activity and benefits of a consistent exercise program on both physical and mental health.  Defined various types of physical activity (aerobic exercise, resistance exercise, flexibility training, balance exercise) and discussed specific guidelines/recommendations for each type of activity.  Educated participants in various ways to monitor/progress activity tolerance including use of RPE scale. Encouraged each participant to identify ways to incorporate regular physical activity into his/her daily routine, and provided handout with various community resources to support ongoing physical activity upon transition back to home and community.   Therapeutic Goal(s): Identify immediate- and long-term effects of physical activity on individual health. Identify various types of physical activity and activities included with each type. Identify and demonstrate ability to monitor individual response to exercise. Identify a goal/interest for incorporating physical activity into daily routine, both in the unit and upon return home.   Individual Participation: Alert and engaged throughout, participated in physical activity during the session where he ambulated throughout the unit without the need of an assistive device.     Participation Level and Quality:  Engaged   Behavior:   Appropriate   Speech/Thought Process: Appropriate    Affect/Mood:  Appropriate   Insight:  Good    Judgement:  Good   Modes of Intervention: Verbal discussion, demonstration, handout provided     Plan: Continue to engage patient in PT/OT groups 1-2x/week.   Ovidio Hanger PT, DPT 03/09/23, 1:51 PM

## 2023-03-10 DIAGNOSIS — F332 Major depressive disorder, recurrent severe without psychotic features: Secondary | ICD-10-CM | POA: Diagnosis not present

## 2023-03-10 MED ORDER — TRAZODONE HCL 50 MG PO TABS
50.0000 mg | ORAL_TABLET | Freq: Every evening | ORAL | Status: DC | PRN
  Administered 2023-03-10 – 2023-03-11 (×2): 50 mg via ORAL
  Filled 2023-03-10 (×2): qty 1

## 2023-03-10 NOTE — Progress Notes (Signed)
 [facility] MD Progress Note  03/10/2023  Franklin [name]  MRN:  [id number]  Franklin [name] is a 53 y.o. male presenting to [facility] emergency department on 02/27/2023 2:25 AM for suicidal ideations. Patient reports going to take some pills or either cut my wrist. Patient states I took some pills a few days ago but I guess it was not enough. He has a psychiatric history of major depressive disorder, suicidal ideation, cocaine use disorder, alcohol use disorder, tobacco abuse, and generalized anxiety disorder .  Subjective:  Chart reviewed, case discussed in multidisciplinary meeting, patient seen during rounds.  No new acute events reported overnight.  Patient reports he is hopeful to have a bed available by Monday.  Patient continues to report improvement in mood.  He reports his sleep and appetite has improved.  He denies suicidal and homicidal ideation.  He denies auditory/visual hallucinations.  He denies side effects from medicine  Sleep: Fair  Appetite:  Fair  Past Psychiatric History: see h&P Family History:  Family History  Problem Relation Age of Onset   Hypertension Other    Diabetes Other    Social History:  Social History   Substance and Sexual Activity  Alcohol Use Yes   Alcohol/week: 12.0 standard drinks of alcohol   Types: 12 Cans of beer per week     Social History   Substance and Sexual Activity  Drug Use Yes   Types: "Crack" cocaine, Cocaine   Comment: one gram daily    Social History   Socioeconomic History   Marital status: Single    Spouse name: Not on file   Number of children: Not on file   Years of education: Not on file   Highest education level: Not on file  Occupational History   Occupation: [employer]  Tobacco Use   Smoking status: Some Days    Current packs/day: 0.50    Types: Cigarettes   Smokeless tobacco: Never  Vaping Use   Vaping status: Never Used  Substance and Sexual Activity   Alcohol use: Yes    Alcohol/week: 12.0  standard drinks of alcohol    Types: 12 Cans of beer per week   Drug use: Yes    Types: "Crack" cocaine, Cocaine    Comment: one gram daily   Sexual activity: Yes  Other Topics Concern   Not on file  Social History Narrative   The patient was born and raised in with that by both his biological parents. His father's past way but his mother still living. He denies any history of any physical or sexual abuse. He says he completed 2 years of college at [employer]. 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 He has never been married but has a 14 year old son who lives with his mother. He says he does get to see his son and has a relationship with him. He is not currently dating or in a relationship.      The patient does have a history of a DUI and had a court date last Friday for a DUI. He denies any other pending charges.            Social Drivers of Corporate investment banker Strain: Not on file  Food Insecurity: Food Insecurity Present (02/27/2023)   Hunger Vital Sign    Worried About Running Out of Food in the Last Year: Sometimes true    Ran Out  of Food in the Last Year: Sometimes true  Transportation Needs: Unmet Transportation Needs (02/27/2023)   PRAPARE - Transportation    Lack of Transportation (Medical): Yes    Lack of Transportation (Non-Medical): Yes  Physical Activity: Not on file  Stress: Not on file  Social Connections: Patient Declined (02/27/2023)   Social Connection and Isolation Panel [NHANES]    Frequency of Communication with Friends and Family: Patient declined    Frequency of Social Gatherings with Friends and Family: Patient declined    Attends Religious Services: Patient declined    Database administrator or Organizations: Patient declined    Attends Engineer, structural: Patient declined    Marital Status: Patient declined   Past Medical History:  Past Medical History:   Diagnosis Date   Anxiety    COPD (chronic obstructive pulmonary disease) (HCC)    Depression    Substance abuse (HCC)     Past Surgical History:  Procedure Laterality Date   NO PAST SURGERIES      Current Medications: Current Facility-Administered Medications  Medication Dose Route Frequency Provider Last Rate Last Admin   acetaminophen (TYLENOL) tablet 650 mg  650 mg Oral Q6H PRN Penn, Cicely, NP       albuterol (PROVENTIL) (2.5 MG/3ML) 0.083% nebulizer solution 2.5 mg  2.5 mg Inhalation Q4H PRN Penn, Cicely, NP       alum & mag hydroxide-simeth (MAALOX/MYLANTA) 200-200-20 MG/5ML suspension 30 mL  30 mL Oral Q4H PRN Penn, Cicely, NP       FLUoxetine (PROZAC) capsule 30 mg  30 mg Oral Daily Verner Chol, MD   30 mg at 03/10/23 0931   magnesium hydroxide (MILK OF MAGNESIA) suspension 30 mL  30 mL Oral Daily PRN Penn, Cranston Neighbor, NP       OLANZapine (ZYPREXA) injection 5 mg  5 mg Intramuscular TID PRN Penn, Cicely, NP       OLANZapine zydis (ZYPREXA) disintegrating tablet 5 mg  5 mg Oral TID PRN Penn, Cicely, NP       Vitamin D (Ergocalciferol) (DRISDOL) 1.25 MG (50000 UNIT) capsule 50,000 Units  50,000 Units Oral Weekly Verner Chol, MD   50,000 Units at 03/04/23 6962      Blood Alcohol level:  Lab Results  Component Value Date   ETH <10 02/27/2023   ETH <10 02/19/2023    Metabolic Disorder Labs: Lab Results  Component Value Date   HGBA1C 5.6 03/05/2023   MPG 114.02 03/05/2023   MPG 117 02/02/2009   No results found for: "PROLACTIN" Lab Results  Component Value Date   CHOL 193 03/05/2023   TRIG 178 (H) 03/05/2023   HDL 52 03/05/2023   CHOLHDL 3.7 03/05/2023   VLDL 36 03/05/2023   LDLCALC 105 (H) 03/05/2023   LDLCALC 80 10/31/2022    Psychiatric Specialty Exam:   Presentation  General Appearance:   Casual   Eye Contact: Fair   Speech: Clear and Coherent   Speech Volume: Normal       Mood and Affect  Mood: " Good"   Affect: Stable and  animated   Thought Process  Thought Processes: Coherent   Descriptions of Associations:Intact   Orientation:Full (Time, Place and Person)   Thought Content:Logical   Hallucinations:Hallucinations: None   Ideas of Reference:None   Suicidal Thoughts: Denies  Homicidal Thoughts:Homicidal Thoughts: No     Sensorium  Memory: Immediate Fair; Recent Fair; Remote Fair   Judgment: Improved   Insight: Improved     Art therapist  Concentration: Fair   Attention Span: Fair   Recall: [employer] of Knowledge: Fair   Language: Fair     Psychomotor Activity  Psychomotor Activity: Psychomotor Activity: Normal  Musculoskeletal: Strength & Muscle Tone: within normal limits Gait & Station: normal Assets  Assets: Manufacturing systems engineer; Desire for Improvement; Physical Health    Physical Exam: Physical Exam Vitals and nursing note reviewed.  HENT:     Head: Normocephalic.     Nose: No congestion.     Mouth/Throat:     Mouth: Mucous membranes are moist.  Eyes:     Pupils: Pupils are equal, round, and reactive to light.  Cardiovascular:     Rate and Rhythm: Normal rate.  Pulmonary:     Breath sounds: Normal breath sounds.  Abdominal:     General: Bowel sounds are normal.  Skin:    General: Skin is warm.  Neurological:     General: No focal deficit present.     Mental Status: He is alert.    Review of Systems  Constitutional: Negative.   HENT: Negative.    Eyes: Negative.   Respiratory: Negative.    Cardiovascular: Negative.   Gastrointestinal: Negative.   Skin: Negative.   Neurological: Negative.    Blood pressure 126/76, pulse 81, temperature 98 F (36.7 C), resp. rate 18, height 5\' 10"  (1.778 m), weight 73.5 kg, SpO2 97%. Body mass index is 23.24 kg/m.  MDD (major depressive disorder), Recurrent severe with out psychosis Vs Substance induced mood disorder Alcohol use disorder  Stimulant use disorder     Clinical Decision Making:  Patient with history of depression, polysubstance use presented today for worsening depression, suicidal ideation.  He endorses hopelessness and anhedonia with worsening anxiety.  Given polysubstance use is noted to be impulsive in nature.  He carries a moderate risk for suicide.  Patient is currently admitted to geropsych inpatient unit.   Treatment Plan Summary:   Safety and Monitoring:             -- Voluntary admission to inpatient psychiatric unit for safety, stabilization and treatment             -- Daily contact with patient to assess and evaluate symptoms and progress in treatment             -- Patient's case to be discussed in multi-disciplinary team meeting             -- Observation Level: q15 minute checks             -- Vital signs:  q12 hours             -- Precautions: suicide, elopement, and assault   2. Psychiatric Diagnoses and Treatment:    Prozac 30 mg  for depression.             Patient reports being on her Risperdal and Prozac Patient is not currently psychotic so will hold off Risperdal at this time     -- The risks/benefits/side-effects/alternatives to this medication were discussed in detail with the patient and time was given for questions. The patient consents to medication trial.                             3. Medical Issues Being Addressed:  No medical emergent needs noted   4. Discharge Planning:              -- Social work  and case management to assist with discharge planning and identification of hospital follow-up needs prior to discharge             -- Estimated LOS:2-3 days, patient awaiting bed at [facility]             -- Discharge Concerns: Placement   Lewanda Rife, MD

## 2023-03-10 NOTE — Group Note (Signed)
 Recreation Therapy Group Note   Group Topic:Emotion Expression  Group Date: 03/10/2023 Start Time: 1500 End Time: 1550 Facilitators: Rosina Lowenstein, LRT, CTRS Location:  Dayroom  Group Description: Painting a Diplomatic Services operational officer. Patients and LRT discuss what it means to be "at peace", what it feels like physically and mentally. Pts are given a canvas and watercolor paint to use and encouraged to draw their idea of a peaceful place. Pts and LRT discuss how they use this in their daily life post discharge. Pts are encouraged to take their canvas home with them as a reminder to find their peaceful place whenever they are feeling depressed, anxious, etc.    Goal Area(s) Addressed:  Patient will identify what it means to experience a "peaceful" emotion. Patient will identify a new coping skill.  Patient will express their emotions through art. Patients will increase communication by talking with LRT and peers while in group.   Affect/Mood: Elevated   Participation Level: Active and Engaged   Participation Quality: Independent   Behavior: Alert   Speech/Thought Process: Coherent   Insight: Fair   Judgement: Fair    Modes of Intervention: Art, Exploration, Music, Open Conversation, Socialization, and Support   Patient Response to Interventions:  Attentive, Engaged, Interested , and Receptive   Education Outcome:  Acknowledges education   Clinical Observations/Individualized Feedback: Franklin [name] was active in their participation of session activities and group discussion. Pt identified "Being outside with a dog and deer, maybe some flowers and the sun" as his peaceful place. Pt was noticeably more talkative and interactive than in previous interactions. Pt interacted well with LRT and peers duration of session.    Plan: Continue to engage patient in RT group sessions 2-3x/week.   Rosina Lowenstein, LRT, CTRS 03/10/2023 5:16 PM

## 2023-03-10 NOTE — Plan of Care (Signed)
   Problem: Clinical Measurements: Goal: Ability to maintain clinical measurements within normal limits will improve Outcome: Progressing Goal: Will remain free from infection Outcome: Progressing

## 2023-03-10 NOTE — B[facility] Counselor (Signed)
 CSW contacted the office of [facility]'s office [phone]) to get in contact with pt's lawyer. This is the third attempt.   CSW left a HIPAA compliant VM,requesting a return call.    CSW sent email to pt's lawyer to request letter as  needed for admission to [facility].    Reynaldo Minium, MSW, [place] 03/10/2023 12:46 PM

## 2023-03-10 NOTE — BH IP Treatment Plan (Signed)
 Interdisciplinary Treatment and Diagnostic Plan Update  03/10/2023 Time of Session: 11:30 AM  Franklin [name] MRN: [id number]  Principal Diagnosis: <principal problem not specified>  Secondary Diagnoses: Active Problems:   MDD (major depressive disorder)   Current Medications:  Current Facility-Administered Medications  Medication Dose Route Frequency Provider Last Rate Last Admin   acetaminophen (TYLENOL) tablet 650 mg  650 mg Oral Q6H PRN Penn, Cicely, NP       albuterol (PROVENTIL) (2.5 MG/3ML) 0.083% nebulizer solution 2.5 mg  2.5 mg Inhalation Q4H PRN Penn, Cicely, NP       alum & mag hydroxide-simeth (MAALOX/MYLANTA) 200-200-20 MG/5ML suspension 30 mL  30 mL Oral Q4H PRN Penn, Cicely, NP       FLUoxetine (PROZAC) capsule 30 mg  30 mg Oral Daily Verner Chol, MD   30 mg at 03/10/23 0931   magnesium hydroxide (MILK OF MAGNESIA) suspension 30 mL  30 mL Oral Daily PRN Penn, Cranston Neighbor, NP       OLANZapine (ZYPREXA) injection 5 mg  5 mg Intramuscular TID PRN Penn, Cicely, NP       OLANZapine zydis (ZYPREXA) disintegrating tablet 5 mg  5 mg Oral TID PRN Penn, Cranston Neighbor, NP       Vitamin D (Ergocalciferol) (DRISDOL) 1.25 MG (50000 UNIT) capsule 50,000 Units  50,000 Units Oral Weekly Verner Chol, MD   50,000 Units at 03/04/23 1191   PTA Medications: Medications Prior to Admission  Medication Sig Dispense Refill Last Dose/Taking   albuterol (VENTOLIN HFA) 108 (90 Base) MCG/ACT inhaler Inhale 2 puffs into the lungs every 4 (four) hours as needed for wheezing or shortness of breath. 1 each 0    amitriptyline (ELAVIL) 25 MG tablet Take 25 mg by mouth at bedtime.      ARIPiprazole (ABILIFY) 5 MG tablet Take 5 mg by mouth daily.      azithromycin (ZITHROMAX) 250 MG tablet Take 1 tablet (250 mg total) by mouth daily. Take 1 tablet by mouth daily, first dose given in the emergency department (Patient not taking: Reported on 02/27/2023) 4 tablet 0    chlordiazePOXIDE (LIBRIUM) 25 MG capsule 50mg  PO  TID x 1D, then 25-50mg  PO BID X 1D, then 25-50mg  PO QD X 1D (Patient not taking: Reported on 02/27/2023) 10 capsule 0    DULERA 200-5 MCG/ACT AERO Inhale 2 puffs into the lungs in the morning and at bedtime.      FLUoxetine (PROZAC) 20 MG capsule Take 1 capsule (20 mg total) by mouth daily. 30 capsule 0    IBU 800 MG tablet Take 800 mg by mouth every 6 (six) hours as needed for moderate pain (pain score 4-6).      predniSONE (DELTASONE) 50 MG tablet Take 1 tab by mouth daily (Patient not taking: Reported on 02/27/2023) 4 tablet 0    risperiDONE (RISPERDAL) 1 MG tablet Take 1 tablet (1 mg total) by mouth at bedtime. 30 tablet 0    traZODone (DESYREL) 50 MG tablet Take 1 tablet (50 mg total) by mouth at bedtime as needed for sleep. 30 tablet 0    VENTOLIN HFA 108 (90 Base) MCG/ACT inhaler Inhale 2 puffs into the lungs every 6 (six) hours as needed for wheezing or shortness of breath. (Patient not taking: Reported on 02/27/2023)      Vitamin D, Ergocalciferol, (DRISDOL) 1.25 MG (50000 UNIT) CAPS capsule Take 50,000 Units by mouth once a week.       Patient Stressors:    Patient Strengths:  Treatment Modalities: Medication Management, Group therapy, Case management,  1 to 1 session with clinician, Psychoeducation, Recreational therapy.   Physician Treatment Plan for Primary Diagnosis: <principal problem not specified> Long Term Goal(s): Improvement in symptoms so as ready for discharge   Short Term Goals: Ability to identify changes in lifestyle to reduce recurrence of condition will improve Ability to verbalize feelings will improve Ability to disclose and discuss suicidal ideas Ability to demonstrate self-control will improve Ability to identify and develop effective coping behaviors will improve Ability to maintain clinical measurements within normal limits will improve Compliance with prescribed medications will improve Ability to identify triggers associated with substance abuse/mental  health issues will improve  Medication Management: Evaluate patient's response, side effects, and tolerance of medication regimen.  Therapeutic Interventions: 1 to 1 sessions, Unit Group sessions and Medication administration.  Evaluation of Outcomes: Progressing  Physician Treatment Plan for Secondary Diagnosis: Active Problems:   MDD (major depressive disorder)  Long Term Goal(s): Improvement in symptoms so as ready for discharge   Short Term Goals: Ability to identify changes in lifestyle to reduce recurrence of condition will improve Ability to verbalize feelings will improve Ability to disclose and discuss suicidal ideas Ability to demonstrate self-control will improve Ability to identify and develop effective coping behaviors will improve Ability to maintain clinical measurements within normal limits will improve Compliance with prescribed medications will improve Ability to identify triggers associated with substance abuse/mental health issues will improve     Medication Management: Evaluate patient's response, side effects, and tolerance of medication regimen.  Therapeutic Interventions: 1 to 1 sessions, Unit Group sessions and Medication administration.  Evaluation of Outcomes: Progressing   RN Treatment Plan for Primary Diagnosis: <principal problem not specified> Long Term Goal(s): Knowledge of disease and therapeutic regimen to maintain health will improve  Short Term Goals: Ability to remain free from injury will improve, Ability to verbalize frustration and anger appropriately will improve, Ability to demonstrate self-control, Ability to participate in decision making will improve, Ability to verbalize feelings will improve, Ability to disclose and discuss suicidal ideas, Ability to identify and develop effective coping behaviors will improve, and Compliance with prescribed medications will improve  Medication Management: RN will administer medications as ordered by  provider, will assess and evaluate patient's response and provide education to patient for prescribed medication. RN will report any adverse and/or side effects to prescribing provider.  Therapeutic Interventions: 1 on 1 counseling sessions, Psychoeducation, Medication administration, Evaluate responses to treatment, Monitor vital signs and CBGs as ordered, Perform/monitor CIWA, COWS, AIMS and Fall Risk screenings as ordered, Perform wound care treatments as ordered.  Evaluation of Outcomes: Progressing   LCSW Treatment Plan for Primary Diagnosis: <principal problem not specified> Long Term Goal(s): Safe transition to appropriate next level of care at discharge, Engage patient in therapeutic group addressing interpersonal concerns.  Short Term Goals: Engage patient in aftercare planning with referrals and resources, Increase social support, Increase ability to appropriately verbalize feelings, Increase emotional regulation, Facilitate acceptance of mental health diagnosis and concerns, Facilitate patient progression through stages of change regarding substance use diagnoses and concerns, Identify triggers associated with mental health/substance abuse issues, and Increase skills for wellness and recovery  Therapeutic Interventions: Assess for all discharge needs, 1 to 1 time with Social worker, Explore available resources and support systems, Assess for adequacy in community support network, Educate family and significant other(s) on suicide prevention, Complete Psychosocial Assessment, Interpersonal group therapy.  Evaluation of Outcomes: Progressing   Progress in Treatment: Attending  groups: Yes. and No. Participating in groups: Yes. and No. Taking medication as prescribed: Yes. Toleration medication: Yes. Family/Significant other contact made: No, will contact:  CSW will contact if given permission  Patient understands diagnosis: Yes. Discussing patient identified problems/goals with staff:  Yes. Medical problems stabilized or resolved: Yes. Denies suicidal/homicidal ideation: Yes. Issues/concerns per patient self-inventory: No. Other: none   New problem(s) identified: No, Describe:  none identified Update 03/10/23: No changes at this time    New Short Term/Long Term Goal(s): detox, elimination of symptoms of psychosis, medication management for mood stabilization; elimination of SI thoughts; development of comprehensive mental wellness/sobriety plan.  Update 03/10/23: No changes at this time    Patient Goals:   "Get back on my medici cation, get really balanced, get detoxed, get back to treatment"  Update 03/10/23: No changes at this time    Discharge Plan or Barriers: CSW will assist with appropriate discharge planning  Update 03/10/23: Pt is still looking for substance use treatment but has an impending court date/warrant that is causing some issues.      Reason for Continuation of Hospitalization: Depression Medical Issues Medication stabilization   Estimated Length of Stay: 1-7 days  Update 03/10/23: No changes at this time   Last 3 [place] Suicide Severity Risk Score: Flowsheet Row Admission (Current) from 02/27/2023 in [facility] Most recent reading at 02/27/2023  3:00 PM ED from 02/27/2023 in [facility] Emergency Department at [facility] Most recent reading at 02/27/2023  1:59 AM ED from 02/18/2023 in [facility] Emergency Department at [facility] Most recent reading at 02/18/2023 11:59 PM  C-SSRS RISK CATEGORY Low Risk No Risk No Risk       Last PHQ 2/9 Scores:    12/09/2022    8:03 AM 12/06/2022    8:56 AM 12/05/2022    5:22 PM  Depression screen PHQ 2/9  Decreased Interest 0 3 3  Down, Depressed, Hopeless 0 3 3  PHQ - 2 Score 0 6 6  Altered sleeping 0  3  Tired, decreased energy 0  3  Change in appetite 0  3  Feeling bad or failure about yourself  0  3  Trouble concentrating 0  3  Moving slowly or fidgety/restless   3   Suicidal thoughts 0  3  PHQ-9 Score 0  27  Difficult doing work/chores   Extremely dIfficult    Scribe for Treatment Team: Elza Rafter, Theresia Majors 03/10/2023 4:23 PM

## 2023-03-10 NOTE — Progress Notes (Signed)
   03/10/23 1100  Psych Admission Type (Psych Patients Only)  Admission Status Voluntary  Psychosocial Assessment  Patient Complaints Depression  Eye Contact Fair  Facial Expression Other (Comment) (appropriate)  Affect Appropriate to circumstance  Speech Logical/coherent  Interaction Minimal  Motor Activity Slow  Appearance/Hygiene In scrubs  Behavior Characteristics Cooperative  Mood Pleasant  Thought Process  Coherency WDL  Content WDL  Delusions None reported or observed  Perception WDL  Hallucination None reported or observed  Judgment WDL  Confusion None  Danger to Self  Current suicidal ideation? Denies  Agreement Not to Harm Self Yes  Description of Agreement verbal  Danger to Others  Danger to Others None reported or observed

## 2023-03-10 NOTE — Group Note (Signed)
 Recreation Therapy Group Note   Group Topic:Relaxation  Group Date: 03/10/2023 Start Time: 1100 End Time: 1135 Facilitators: Rosina Lowenstein, LRT, CTRS Location:  Dayroom  Group Description: PMR (Progressive Muscle Relaxation). LRT educates patients on what PMR is and the benefits that come from it. Patients are asked to sit with their feet flat on the floor while sitting up and all the way back in their chair, if possible. LRT and pts follow a prompt through a speaker that requires you to tense and release different muscles in their body and focus on their breathing. During session, lights are off and soft music is being played. LRT provides patients with an education handout on PMR.     Goal Area(s) Addressed:    Patients will be able to describe progressive muscle relaxation.    Patient will practice using relaxation technique.   Patient will identify a new coping skill.    Patient will follow multistep directions to reduce anxiety and stress.    Affect/Mood: N/A   Participation Level: Did not attend    Clinical Observations/Individualized Feedback: Patient did not attend group.  Plan: Continue to engage patient in RT group sessions 2-3x/week.   Rosina Lowenstein, LRT, CTRS 03/10/2023 12:37 PM

## 2023-03-10 NOTE — Group Note (Signed)
 Date:  03/10/2023 Time:  8:31 PM  Group Topic/Focus:  Making Healthy Choices:   The focus of this group is to help patients identify negative/unhealthy choices they were using prior to admission and identify positive/healthier coping strategies to replace them upon discharge.    Participation Level:  Active  Participation Quality:  Appropriate  Affect:  Appropriate  Cognitive:  Appropriate  Insight: Appropriate and Good  Engagement in Group:  Engaged  Modes of Intervention:  Discussion  Additional Comments:    Burt Ek 03/10/2023, 8:31 PM

## 2023-03-11 DIAGNOSIS — F332 Major depressive disorder, recurrent severe without psychotic features: Secondary | ICD-10-CM | POA: Diagnosis not present

## 2023-03-11 NOTE — Group Note (Signed)
 Date:  03/11/2023 Time:  5:04 PM  Group Topic/Focus:  Wellness Toolbox:   The focus of this group is to discuss various aspects of wellness, balancing those aspects and exploring ways to increase the ability to experience wellness.  Patients will create a wellness toolbox for use upon discharge.    Participation Level:  Active  Participation Quality:  Appropriate and Attentive  Affect:  Appropriate  Cognitive:  Alert and Appropriate  Insight: Appropriate and Good  Engagement in Group:  Engaged  Modes of Intervention:  Socialization  Additional Comments:     Alexis Frock 03/11/2023, 5:04 PM

## 2023-03-11 NOTE — Progress Notes (Signed)
   03/11/23 1000  Psych Admission Type (Psych Patients Only)  Admission Status Voluntary  Psychosocial Assessment  Patient Complaints Depression  Eye Contact Fair  Facial Expression Other (Comment) (appropriate)  Affect Appropriate to circumstance  Speech Logical/coherent  Interaction Minimal  Motor Activity Slow  Appearance/Hygiene In scrubs  Behavior Characteristics Cooperative  Mood Pleasant  Thought Process  Coherency WDL  Content WDL  Delusions None reported or observed  Perception WDL  Hallucination None reported or observed  Judgment WDL  Confusion None  Danger to Self  Current suicidal ideation? Denies  Agreement Not to Harm Self Yes  Description of Agreement verbal  Danger to Others  Danger to Others None reported or observed

## 2023-03-11 NOTE — Progress Notes (Signed)
   03/11/23 0615  15 Minute Checks  Location Bedroom  Visual Appearance Calm  Behavior Sleeping  Sleep (Behavioral Health Patients Only)  Calculate sleep? (Click Yes once per 24 hr at 0600 safety check) Yes  Documented sleep last 24 hours 7.75

## 2023-03-11 NOTE — Group Note (Signed)
 Recreation Therapy Group Note   Group Topic:Leisure Education  Group Date: 03/11/2023 Start Time: 1100 End Time: 1140 Facilitators: Rosina Lowenstein, LRT, CTRS Location:  Dayroom  Group Description: Leisure Education. Patients were given the opportunity to play cards, play chess, or color. Pt identified and conversated about things they enjoy doing in their free time and how they can continue to do that outside of the hospital.  Goal Area(s) Addressed: Patient will practice making a positive decision. Patient will have the opportunity to try a new leisure activity. Patient will communicate with peers and LRT.   Affect/Mood: N/A   Participation Level: Did not attend    Clinical Observations/Individualized Feedback: Patient did not attend group.   Plan: Continue to engage patient in RT group sessions 2-3x/week.   Rosina Lowenstein, LRT, CTRS 03/11/2023 12:29 PM

## 2023-03-11 NOTE — B[facility] Counselor (Signed)
 CSW faxed requested documents to [facility] [phone])  Reynaldo Minium, MSW, [facility] 03/11/2023 10:45 AM

## 2023-03-11 NOTE — Progress Notes (Signed)
.  Occupational Therapy Group Note  Group Topic:  Seated Upper Extremity Exercises with Theraband  Group Date: 03/11/2023  Group Time (start and end): 1:10pm - 1:55pm Facilitators: Wynona Canes, OTR/L   Group Description: Group instructed in series of upper extremities exercises, aimed to promote strength, flexibility, range of motion and functional endurance.  Patients provided cuing for proper mechanics and proper pace of exercise; exercises adjusted as necessary for individualized patient needs.  Patient also engaged in cognitive components throughout session, working to integrate attention to task, command following, turn-taking and appropriate social interaction throughout session.  Allowed to ask questions as appropriate, and encouraged to identify specific exercises that they could complete independently outside of group sessions.   Therapeutic Goal(s): Demonstrate appropriate performance of upper extremity exercises to promote strength, flexibility, range of motion and functional endurance Identify 2-3 specific upper extremity exercises to complete as home exercise program outside of group session   Individual Participation: Pt actively engaged throughout. Pt generally appropriate, however, when provided with the theraband he wrapped it around his neck and stated, "Oh good, I've been looking for one of these. I just need a few more." Pt was smiling and appeared to be attempting to joke. He promptly removed it when asked by therapist. Pt demonstrated proper technique for all exercises with PRN VC. Pt demonstrated fair insight into ways to incorporate exercises into daily routines. He also identified personal experiences and external motivational factors that were important to him to help ensure he continues with exercises.     Participation Level: active   Participation Quality: Engaged, asked questions, shared experiences    Behavior: Inappropriate use of equipment, corrected with cue    Speech/Thought Process: appropriate   Affect/Mood: Pt motivated to improve   Insight: good   Judgement: fair   Modes of Intervention: Visual demonstration, handout, hands on practice with equipment      Plan: Continue to engage patient in PT/OT groups 1-2x/week.   Arman Filter., MPH, MS, OTR/L ascom [phone] 03/11/23, 4:26 PM

## 2023-03-11 NOTE — Group Note (Signed)
 Recreation Therapy Group Note   Group Topic:General Recreation  Group Date: 03/11/2023 Start Time: 1500 End Time: 1600 Facilitators: Rosina Lowenstein, LRT, CTRS Location:  Dayroom  Group Description: Bingo. LRT and patients played multiple games of Bingo with music playing in the background. LRT and pts discussed how this could be a leisure interest and the importance of doing things they enjoy post-discharge. Pts won stress balls and [employer] as Chief Financial Officer.    Goal Area(s) Addressed: Patient will identify leisure interests.  Patient will practice healthy decision making. Patient will engage in recreation activity.  Patient will increase communication.   Affect/Mood: Appropriate   Participation Level: Active and Engaged   Participation Quality: Independent   Behavior: Appropriate, Calm, and Cooperative   Speech/Thought Process: Coherent   Insight: Good   Judgement: Good   Modes of Intervention: Activity   Patient Response to Interventions:  Attentive, Engaged, and Receptive   Education Outcome:  Acknowledges education   Clinical Observations/Individualized Feedback: Adryel was active in their participation of session activities and group discussion. Pt won a Facilities manager and received a stress ball as a prize. Pt interacted well with LRT and peers duration of session.    Plan: Continue to engage patient in RT group sessions 2-3x/week.   Rosina Lowenstein, LRT, CTRS 03/11/2023 5:34 PM

## 2023-03-11 NOTE — Plan of Care (Signed)
  Problem: Education: Goal: Knowledge of disease or condition will improve Outcome: Progressing Goal: Understanding of discharge needs will improve Outcome: Progressing   Problem: Health Behavior/Discharge Planning: Goal: Ability to identify changes in lifestyle to reduce recurrence of condition will improve Outcome: Progressing Goal: Identification of resources available to assist in meeting health care needs will improve Outcome: Progressing   Problem: Safety: Goal: Ability to remain free from injury will improve Outcome: Progressing

## 2023-03-11 NOTE — Plan of Care (Signed)
   Problem: Education: Goal: Knowledge of General Education information will improve Description Including pain rating scale, medication(s)/side effects and non-pharmacologic comfort measures Outcome: Progressing   Problem: Health Behavior/Discharge Planning: Goal: Ability to manage health-related needs will improve Outcome: Progressing

## 2023-03-11 NOTE — Progress Notes (Signed)
 [facility] MD Progress Note  03/11/2023  Franklin [name]  MRN:  [id number]  Franklin [name] is a 53 y.o. male presenting to [facility] emergency department on 02/27/2023 2:25 AM for suicidal ideations. Patient reports going to take some pills or either cut my wrist. Patient states I took some pills a few days ago but I guess it was not enough. He has a psychiatric history of major depressive disorder, suicidal ideation, cocaine use disorder, alcohol use disorder, tobacco abuse, and generalized anxiety disorder .  Subjective:  Chart reviewed, case discussed in multidisciplinary meeting, patient seen during rounds.  No new acute events reported overnight. We discussed rehab options, pt also reports he has a court hearing March 18, for a trespassing charge. Patient was encouraged to work on the court hearing with his attorney. Patient continues to report improvement in mood.  He reports his sleep and appetite has improved.  He denies suicidal and homicidal ideation.  He denies auditory/visual hallucinations.  He denies side effects from medicine  Sleep: Fair  Appetite:  Fair  Past Psychiatric History: see h&P Family History:  Family History  Problem Relation Age of Onset   Hypertension Other    Diabetes Other    Social History:  Social History   Substance and Sexual Activity  Alcohol Use Yes   Alcohol/week: 12.0 standard drinks of alcohol   Types: 12 Cans of beer per week     Social History   Substance and Sexual Activity  Drug Use Yes   Types: "Crack" cocaine, Cocaine   Comment: one gram daily    Social History   Socioeconomic History   Marital status: Single    Spouse name: Not on file   Number of children: Not on file   Years of education: Not on file   Highest education level: Not on file  Occupational History   Occupation: [employer]  Tobacco Use   Smoking status: Some Days    Current packs/day: 0.50    Types: Cigarettes   Smokeless tobacco: Never  Vaping Use    Vaping status: Never Used  Substance and Sexual Activity   Alcohol use: Yes    Alcohol/week: 12.0 standard drinks of alcohol    Types: 12 Cans of beer per week   Drug use: Yes    Types: "Crack" cocaine, Cocaine    Comment: one gram daily   Sexual activity: Yes  Other Topics Concern   Not on file  Social History Narrative   The patient was born and raised in with that by both his biological parents. His father's past way but his mother still living. He denies any history of any physical or sexual abuse. He says he completed 2 years of college at [employer]. 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 He has never been married but has a 97 year old son who lives with his mother. He says he does get to see his son and has a relationship with him. He is not currently dating or in a relationship.      The patient does have a history of a DUI and had a court date last Friday for a DUI. He denies any other pending charges.            Social Drivers of Corporate investment banker Strain: Not on file  Food Insecurity: Food Insecurity Present (02/27/2023)   Hunger Vital Sign    Worried  About Running Out of Food in the Last Year: Sometimes true    Ran Out of Food in the Last Year: Sometimes true  Transportation Needs: Unmet Transportation Needs (02/27/2023)   PRAPARE - Transportation    Lack of Transportation (Medical): Yes    Lack of Transportation (Non-Medical): Yes  Physical Activity: Not on file  Stress: Not on file  Social Connections: Patient Declined (02/27/2023)   Social Connection and Isolation Panel [NHANES]    Frequency of Communication with Friends and Family: Patient declined    Frequency of Social Gatherings with Friends and Family: Patient declined    Attends Religious Services: Patient declined    Database administrator or Organizations: Patient declined    Attends Engineer, structural:  Patient declined    Marital Status: Patient declined   Past Medical History:  Past Medical History:  Diagnosis Date   Anxiety    COPD (chronic obstructive pulmonary disease) (HCC)    Depression    Substance abuse (HCC)     Past Surgical History:  Procedure Laterality Date   NO PAST SURGERIES      Current Medications: Current Facility-Administered Medications  Medication Dose Route Frequency Provider Last Rate Last Admin   acetaminophen (TYLENOL) tablet 650 mg  650 mg Oral Q6H PRN Penn, Cicely, NP       albuterol (PROVENTIL) (2.5 MG/3ML) 0.083% nebulizer solution 2.5 mg  2.5 mg Inhalation Q4H PRN Penn, Cicely, NP       alum & mag hydroxide-simeth (MAALOX/MYLANTA) 200-200-20 MG/5ML suspension 30 mL  30 mL Oral Q4H PRN Penn, Cicely, NP       FLUoxetine (PROZAC) capsule 30 mg  30 mg Oral Daily Verner Chol, MD   30 mg at 03/11/23 0909   magnesium hydroxide (MILK OF MAGNESIA) suspension 30 mL  30 mL Oral Daily PRN Penn, Cranston Neighbor, NP       OLANZapine (ZYPREXA) injection 5 mg  5 mg Intramuscular TID PRN Penn, Cicely, NP       OLANZapine zydis (ZYPREXA) disintegrating tablet 5 mg  5 mg Oral TID PRN Mcneil Sober, NP       traZODone (DESYREL) tablet 50 mg  50 mg Oral QHS PRN Onuoha, Chinwendu V, NP   50 mg at 03/10/23 2240   Vitamin D (Ergocalciferol) (DRISDOL) 1.25 MG (50000 UNIT) capsule 50,000 Units  50,000 Units Oral Weekly Verner Chol, MD   50,000 Units at 03/10/23 2240      Blood Alcohol level:  Lab Results  Component Value Date   ETH <10 02/27/2023   ETH <10 02/19/2023    Metabolic Disorder Labs: Lab Results  Component Value Date   HGBA1C 5.6 03/05/2023   MPG 114.02 03/05/2023   MPG 117 02/02/2009   No results found for: "PROLACTIN" Lab Results  Component Value Date   CHOL 193 03/05/2023   TRIG 178 (H) 03/05/2023   HDL 52 03/05/2023   CHOLHDL 3.7 03/05/2023   VLDL 36 03/05/2023   LDLCALC 105 (H) 03/05/2023   LDLCALC 80 10/31/2022    Psychiatric Specialty  Exam:   Presentation  General Appearance:   Casual   Eye Contact: Fair   Speech: Clear and Coherent   Speech Volume: Normal       Mood and Affect  Mood: " Good"   Affect: Stable and animated   Thought Process  Thought Processes: Coherent   Descriptions of Associations:Intact   Orientation:Full (Time, Place and Person)   Thought Content:Logical   Hallucinations:Hallucinations: None  Ideas of Reference:None   Suicidal Thoughts: Denies  Homicidal Thoughts:Homicidal Thoughts: No     Sensorium  Memory: Immediate Fair; Recent Fair; Remote Fair   Judgment: Improved   Insight: Improved     Executive Functions  Concentration: Fair   Attention Span: Fair   Recall: [employer] of Knowledge: Fair   Language: Fair     Psychomotor Activity  Psychomotor Activity: Psychomotor Activity: Normal  Musculoskeletal: Strength & Muscle Tone: within normal limits Gait & Station: normal Assets  Assets: Manufacturing systems engineer; Desire for Improvement; Physical Health    Physical Exam: Physical Exam Vitals and nursing note reviewed.  HENT:     Head: Normocephalic.     Nose: No congestion.     Mouth/Throat:     Mouth: Mucous membranes are moist.  Eyes:     Pupils: Pupils are equal, round, and reactive to light.  Cardiovascular:     Rate and Rhythm: Normal rate.  Pulmonary:     Breath sounds: Normal breath sounds.  Abdominal:     General: Bowel sounds are normal.  Skin:    General: Skin is warm.  Neurological:     General: No focal deficit present.     Mental Status: He is alert.    Review of Systems  Constitutional: Negative.   HENT: Negative.    Eyes: Negative.   Respiratory: Negative.    Cardiovascular: Negative.   Gastrointestinal: Negative.   Skin: Negative.   Neurological: Negative.    Blood pressure 104/68, pulse 60, temperature 97.9 F (36.6 C), resp. rate 20, height 5\' 10"  (1.778 m), weight 73.5 kg, SpO2 97%. Body mass  index is 23.24 kg/m.  MDD (major depressive disorder), Recurrent severe with out psychosis Vs Substance induced mood disorder Alcohol use disorder  Stimulant use disorder     Clinical Decision Making: Patient with history of depression, polysubstance use presented today for worsening depression, suicidal ideation.  He endorses hopelessness and anhedonia with worsening anxiety.  Given polysubstance use is noted to be impulsive in nature.  He carries a moderate risk for suicide.  Patient is currently admitted to geropsych inpatient unit.   Treatment Plan Summary:   Safety and Monitoring:             -- Voluntary admission to inpatient psychiatric unit for safety, stabilization and treatment             -- Daily contact with patient to assess and evaluate symptoms and progress in treatment             -- Patient's case to be discussed in multi-disciplinary team meeting             -- Observation Level: q15 minute checks             -- Vital signs:  q12 hours             -- Precautions: suicide, elopement, and assault   2. Psychiatric Diagnoses and Treatment:    Prozac 30 mg  for depression.             Patient reports being on her Risperdal and Prozac Patient is not currently psychotic so will hold off Risperdal at this time     -- The risks/benefits/side-effects/alternatives to this medication were discussed in detail with the patient and time was given for questions. The patient consents to medication trial.  3. Medical Issues Being Addressed:  No medical emergent needs noted   4. Discharge Planning:              -- Social work and case management to assist with discharge planning and identification of hospital follow-up needs prior to discharge             -Patient is  awaiting bed at [facility]             -- Discharge Concerns: Placement   Lewanda Rife, MD

## 2023-03-11 NOTE — Progress Notes (Signed)
   03/10/23 2100  Psych Admission Type (Psych Patients Only)  Admission Status Voluntary  Psychosocial Assessment  Patient Complaints Depression  Eye Contact Fair  Facial Expression Other (Comment) (appropriate)  Affect Appropriate to circumstance  Speech Logical/coherent  Interaction Assertive  Motor Activity Slow  Appearance/Hygiene In scrubs  Behavior Characteristics Cooperative  Mood Pleasant  Thought Process  Coherency WDL   Pt is alert and oriented. Calm and cooperative. Visible in the dayroom. Interacting with peers and staff. Po med compliant. No behavior issues noted. Q 15 min checks provided. Denies SI/HI/AVH. No c/o pain/discomfort noted.

## 2023-03-11 NOTE — Group Note (Signed)
 Date:  03/11/2023 Time:  8:36 PM  Group Topic/Focus:  Wrap-Up Group:   The focus of this group is to help patients review their daily goal of treatment and discuss progress on daily workbooks.    Participation Level:  Active  Participation Quality:  Appropriate  Affect:  Appropriate  Cognitive:  Appropriate  Insight: Good  Engagement in Group:  Engaged  Modes of Intervention:  Discussion  Additional Comments:    Burt Ek 03/11/2023, 8:36 PM

## 2023-03-12 DIAGNOSIS — F1994 Other psychoactive substance use, unspecified with psychoactive substance-induced mood disorder: Secondary | ICD-10-CM | POA: Diagnosis not present

## 2023-03-12 MED ORDER — MELATONIN 5 MG PO TABS
5.0000 mg | ORAL_TABLET | Freq: Every day | ORAL | Status: DC
  Administered 2023-03-12 – 2023-03-14 (×3): 5 mg via ORAL
  Filled 2023-03-12 (×3): qty 1

## 2023-03-12 NOTE — Progress Notes (Signed)
   03/11/23 2239  Psych Admission Type (Psych Patients Only)  Admission Status Voluntary  Psychosocial Assessment  Patient Complaints Depression  Eye Contact Fair  Facial Expression Animated  Affect Appropriate to circumstance  Speech Logical/coherent  Interaction Minimal  Motor Activity Slow  Appearance/Hygiene In scrubs  Behavior Characteristics Cooperative  Mood Pleasant  Aggressive Behavior  Effect No apparent injury  Thought Process  Coherency WDL  Content WDL  Delusions None reported or observed  Perception WDL  Hallucination None reported or observed  Judgment WDL  Confusion None  Danger to Self  Current suicidal ideation? Denies  Agreement Not to Harm Self Yes  Description of Agreement Verbal  Danger to Others  Danger to Others None reported or observed

## 2023-03-12 NOTE — Plan of Care (Signed)
 Patient alert and oriented x4. Patient compliant with medications and treatment plan. Denies SI, HI, AVH, and pain. Scheduled medications administered to patient, per MD orders. Support and encouragement provided.  Routine safety checks conducted every 15 minutes.  Patient informed to notify staff with problems or concerns. Patient contracts for safety at this time. Patient receptive, calm, and cooperative. Patient interacts well with others on the unit.  Patient remains safe at this time.  Problem: Education: Goal: Knowledge of General Education information will improve Description: Including pain rating scale, medication(s)/side effects and non-pharmacologic comfort measures Outcome: Progressing   Problem: Health Behavior/Discharge Planning: Goal: Ability to manage health-related needs will improve Outcome: Progressing   Problem: Coping: Goal: Level of anxiety will decrease Outcome: Progressing   Problem: Activity: Goal: Risk for activity intolerance will decrease Outcome: Progressing

## 2023-03-12 NOTE — Plan of Care (Signed)
   Problem: Clinical Measurements: Goal: Ability to maintain clinical measurements within normal limits will improve Outcome: Progressing   Problem: Activity: Goal: Risk for activity intolerance will decrease Outcome: Progressing

## 2023-03-12 NOTE — Progress Notes (Signed)
   03/12/23 2000  Psych Admission Type (Psych Patients Only)  Admission Status Voluntary  Psychosocial Assessment  Patient Complaints Sleep disturbance  Eye Contact Fair  Facial Expression Other (Comment) (Appropriate)  Affect Appropriate to circumstance  Speech Logical/coherent  Interaction Assertive  Motor Activity Other (Comment) (WDL)  Appearance/Hygiene In scrubs  Behavior Characteristics Cooperative;Appropriate to situation  Mood Pleasant  Thought Process  Coherency WDL  Content WDL  Delusions None reported or observed  Perception WDL  Hallucination None reported or observed  Judgment WDL  Confusion None  Danger to Self  Current suicidal ideation? Denies  Agreement Not to Harm Self Yes  Description of Agreement verbal  Danger to Others  Danger to Others None reported or observed

## 2023-03-12 NOTE — Progress Notes (Signed)
 [facility] MD Progress Note  03/12/2023 2:54 PM Kenji Mapel  MRN:  [id number]  Franklin [name] is a 53 y.o. male presenting to [facility] emergency department on 02/27/2023 2:25 AM for suicidal ideations. Patient reports going to take some pills or either cut my wrist. Patient states I took some pills a few days ago but I guess it was not enough. He has a psychiatric history of major depressive disorder, suicidal ideation, cocaine use disorder, alcohol use disorder, tobacco abuse, and generalized anxiety disorder .  Subjective:  Chart reviewed, case discussed in multidisciplinary meeting, patient seen during rounds.  On interview patient reports doing well.  He wanted to talk to the social worker about having a court date on 03/22/2023 and if he goes to the rehab he will not be able to attend the court and he wants to see if he can stay in the hospital until then.  Provider explained him about the acute care facility criteria.  Patient denies feeling depressed or anxious he reports that the trazodone is making him groggy during the daytime and he wants to change that to melatonin at night.  He denies auditory/visual hallucinations.  He denies SI/HI/intent/plan.  He is tolerating Prozac well with no problems Sleep: Fair  Appetite:  Fair  Past Psychiatric History: see h&P Family History:  Family History  Problem Relation Age of Onset   Hypertension Other    Diabetes Other    Social History:  Social History   Substance and Sexual Activity  Alcohol Use Yes   Alcohol/week: 12.0 standard drinks of alcohol   Types: 12 Cans of beer per week     Social History   Substance and Sexual Activity  Drug Use Yes   Types: "Crack" cocaine, Cocaine   Comment: one gram daily    Social History   Socioeconomic History   Marital status: Single    Spouse name: Not on file   Number of children: Not on file   Years of education: Not on file   Highest education level: Not on file   Occupational History   Occupation: [employer]  Tobacco Use   Smoking status: Some Days    Current packs/day: 0.50    Types: Cigarettes   Smokeless tobacco: Never  Vaping Use   Vaping status: Never Used  Substance and Sexual Activity   Alcohol use: Yes    Alcohol/week: 12.0 standard drinks of alcohol    Types: 12 Cans of beer per week   Drug use: Yes    Types: "Crack" cocaine, Cocaine    Comment: one gram daily   Sexual activity: Yes  Other Topics Concern   Not on file  Social History Narrative   The patient was born and raised in with that by both his biological parents. His father's past way but his mother still living. He denies any history of any physical or sexual abuse. He says he completed 2 years of college at [employer]. 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 He has never been married but has a 70 year old son who lives with his mother. He says he does get to see his son and has a relationship with him. He is not currently dating or in a relationship.      The patient does have a history of a DUI and had a court date last Friday for a DUI. He denies any other pending charges.  Social Drivers of Corporate investment banker Strain: Not on file  Food Insecurity: Food Insecurity Present (02/27/2023)   Hunger Vital Sign    Worried About Running Out of Food in the Last Year: Sometimes true    Ran Out of Food in the Last Year: Sometimes true  Transportation Needs: Unmet Transportation Needs (02/27/2023)   PRAPARE - Administrator, Civil Service (Medical): Yes    Lack of Transportation (Non-Medical): Yes  Physical Activity: Not on file  Stress: Not on file  Social Connections: Patient Declined (02/27/2023)   Social Connection and Isolation Panel [NHANES]    Frequency of Communication with Friends and Family: Patient declined    Frequency of Social Gatherings with Friends and  Family: Patient declined    Attends Religious Services: Patient declined    Database administrator or Organizations: Patient declined    Attends Engineer, structural: Patient declined    Marital Status: Patient declined   Past Medical History:  Past Medical History:  Diagnosis Date   Anxiety    COPD (chronic obstructive pulmonary disease) (HCC)    Depression    Substance abuse (HCC)     Past Surgical History:  Procedure Laterality Date   NO PAST SURGERIES      Current Medications: Current Facility-Administered Medications  Medication Dose Route Frequency Provider Last Rate Last Admin   acetaminophen (TYLENOL) tablet 650 mg  650 mg Oral Q6H PRN Penn, Cicely, NP       albuterol (PROVENTIL) (2.5 MG/3ML) 0.083% nebulizer solution 2.5 mg  2.5 mg Inhalation Q4H PRN Penn, Cranston Neighbor, NP   2.5 mg at 03/12/23 0925   alum & mag hydroxide-simeth (MAALOX/MYLANTA) 200-200-20 MG/5ML suspension 30 mL  30 mL Oral Q4H PRN Penn, Cranston Neighbor, NP       FLUoxetine (PROZAC) capsule 30 mg  30 mg Oral Daily Verner Chol, MD   30 mg at 03/12/23 0947   magnesium hydroxide (MILK OF MAGNESIA) suspension 30 mL  30 mL Oral Daily PRN Penn, Cicely, NP       melatonin tablet 5 mg  5 mg Oral QHS Verner Chol, MD       OLANZapine (ZYPREXA) injection 5 mg  5 mg Intramuscular TID PRN Penn, Cicely, NP       OLANZapine zydis (ZYPREXA) disintegrating tablet 5 mg  5 mg Oral TID PRN Penn, Cicely, NP       Vitamin D (Ergocalciferol) (DRISDOL) 1.25 MG (50000 UNIT) capsule 50,000 Units  50,000 Units Oral Weekly Verner Chol, MD   50,000 Units at 03/10/23 2240    Lab Results: No results found for this or any previous visit (from the past 48 hours).  Blood Alcohol level:  Lab Results  Component Value Date   ETH <10 02/27/2023   ETH <10 02/19/2023    Metabolic Disorder Labs: Lab Results  Component Value Date   HGBA1C 5.6 03/05/2023   MPG 114.02 03/05/2023   MPG 117 02/02/2009   No results found for:  "PROLACTIN" Lab Results  Component Value Date   CHOL 193 03/05/2023   TRIG 178 (H) 03/05/2023   HDL 52 03/05/2023   CHOLHDL 3.7 03/05/2023   VLDL 36 03/05/2023   LDLCALC 105 (H) 03/05/2023   LDLCALC 80 10/31/2022    Physical Findings: AIMS:  , ,  ,  ,    CIWA:    COWS:      Psychiatric Specialty Exam:  Presentation  General Appearance:  Appropriate for Environment; Casual  Eye Contact: Fair  Speech: Clear and Coherent  Speech Volume: Normal    Mood and Affect  Mood: Euthymic  Affect: Appropriate   Thought Process  Thought Processes: Coherent  Descriptions of Associations:Intact  Orientation:Full (Time, Place and Person)  Thought Content:Illogical  Hallucinations:Hallucinations: None  Ideas of Reference:None  Suicidal Thoughts:Suicidal Thoughts: No  Homicidal Thoughts:Homicidal Thoughts: No   Sensorium  Memory: Immediate Fair; Recent Fair; Remote Fair  Judgment: Impaired  Insight: None   Executive Functions  Concentration: Fair  Attention Span: Fair  Recall: [employer] of Knowledge: Fair  Language: Fair   Psychomotor Activity  Psychomotor Activity: Psychomotor Activity: Normal  Musculoskeletal: Strength & Muscle Tone: within normal limits Gait & Station: normal Assets  Assets: Manufacturing systems engineer; Financial Resources/Insurance; Resilience    Physical Exam: Physical Exam Vitals and nursing note reviewed.  HENT:     Head: Normocephalic.     Nose: Nose normal.     Mouth/Throat:     Mouth: Mucous membranes are moist.  Eyes:     Pupils: Pupils are equal, round, and reactive to light.  Cardiovascular:     Rate and Rhythm: Normal rate.  Pulmonary:     Breath sounds: Normal breath sounds.  Abdominal:     General: Bowel sounds are normal.  Skin:    General: Skin is warm.  Neurological:     General: No focal deficit present.     Mental Status: He is alert.    Review of Systems  Constitutional:  Negative.   HENT: Negative.    Eyes: Negative.   Respiratory: Negative.    Cardiovascular: Negative.   Gastrointestinal: Negative.   Skin: Negative.   Neurological: Negative.    Blood pressure 119/73, pulse 87, temperature 98.2 F (36.8 C), resp. rate 18, height 5\' 10"  (1.778 m), weight 73.5 kg, SpO2 99%. Body mass index is 23.24 kg/m.  MDD (major depressive disorder), Recurrent severe with out psychosis Vs Substance induced mood disorder Alcohol use disorder  Stimulant use disorder     Clinical Decision Making: Patient with history of depression, polysubstance use presented today for worsening depression, suicidal ideation.  He endorses hopelessness and anhedonia with worsening anxiety.  Given polysubstance use is noted to be impulsive in nature.  He carries a moderate risk for suicide.  Patient is currently admitted to geropsych inpatient unit.   Treatment Plan Summary:   Safety and Monitoring:             -- Voluntary admission to inpatient psychiatric unit for safety, stabilization and treatment             -- Daily contact with patient to assess and evaluate symptoms and progress in treatment             -- Patient's case to be discussed in multi-disciplinary team meeting             -- Observation Level: q15 minute checks             -- Vital signs:  q12 hours             -- Precautions: suicide, elopement, and assault   2. Psychiatric Diagnoses and Treatment:    Prozac 30 mg to optimize the treatment for depression.             Patient reports being on her Risperdal and Prozac Patient is not currently psychotic so will hold off Risperdal at this time     -- The risks/benefits/side-effects/alternatives to this medication  were discussed in detail with the patient and time was given for questions. The patient consents to medication trial.                -- Metabolic profile and EKG monitoring obtained while on an atypical antipsychotic (BMI: Lipid Panel: HbgA1c: QTc:)               -- Encouraged patient to participate in unit milieu and in scheduled group therapies                            3. Medical Issues Being Addressed:  No medical emergent needs noted   4. Discharge Planning:              -- Social work and case management to assist with discharge planning and identification of hospital follow-up needs prior to discharge             -- Estimated LOS: 5-7 days             -- Discharge Concerns: Need to establish a safety plan; Medication compliance and effectiveness             -- Discharge Goals: Return home with outpatient referrals follow ups    Long Term Goal(s): Improvement in symptoms so as ready for discharge   Short Term Goals: Ability to identify changes in lifestyle to reduce recurrence of condition will improve, Ability to verbalize feelings will improve, Ability to disclose and discuss suicidal ideas, Ability to demonstrate self-control will improve, Ability to identify and develop effective coping behaviors will improve, Ability to maintain clinical measurements within normal limits will improve, Compliance with prescribed medications will improve, and Ability to identify triggers associated with substance abuse/mental health issues will improve  Verner Chol, MD 03/12/2023, 2:54 PM

## 2023-03-12 NOTE — Group Note (Signed)
 Date:  03/12/2023 Time:  9:13 PM  Group Topic/Focus:  Healthy Communication:   The focus of this group is to discuss communication, barriers to communication, as well as healthy ways to communicate with others.    Participation Level:  Active  Participation Quality:  Appropriate and Attentive  Affect:  Appropriate  Cognitive:  Alert and Appropriate  Insight: Appropriate and Good  Engagement in Group:  Engaged and Improving  Modes of Intervention:  Activity, Rapport Building, and Support  Additional Comments:     Graviela Nodal 03/12/2023, 9:13 PM

## 2023-03-12 NOTE — Progress Notes (Signed)
   03/12/23 1000  Psych Admission Type (Psych Patients Only)  Admission Status Voluntary  Psychosocial Assessment  Patient Complaints Sleep disturbance  Eye Contact Fair  Facial Expression Other (Comment) (appropriate)  Affect Appropriate to circumstance  Speech Logical/coherent  Interaction Assertive  Motor Activity Other (Comment) (WNL)  Appearance/Hygiene In scrubs  Behavior Characteristics Cooperative  Mood Pleasant  Thought Process  Coherency WDL  Content WDL  Delusions None reported or observed  Perception WDL  Hallucination None reported or observed  Judgment WDL  Confusion None  Danger to Self  Current suicidal ideation? Denies  Agreement Not to Harm Self Yes  Description of Agreement verbal  Danger to Others  Danger to Others None reported or observed

## 2023-03-12 NOTE — [facility] Group Notes (Signed)
 LCSW Group Therapy Note   03/12/2023 1:15pm   Type of Therapy and Topic:  Group Therapy:  Overcoming Obstacles   Participation Level:  Did Not Attend   Description of Group:    In this group patients will be encouraged to explore what they see as obstacles to their own wellness and recovery. They will be guided to discuss their thoughts, feelings, and behaviors related to these obstacles. The group will process together ways to cope with barriers, with attention given to specific choices patients can make. Each patient will be challenged to identify changes they are motivated to make in order to overcome their obstacles. This group will be process-oriented, with patients participating in exploration of their own experiences as well as giving and receiving support and challenge from other group members.   Therapeutic Goals: Patient will identify personal and current obstacles as they relate to admission. Patient will identify barriers that currently interfere with their wellness or overcoming obstacles.  Patient will identify feelings, thought process and behaviors related to these barriers. Patient will identify two changes they are willing to make to overcome these obstacles:      Summary of Patient Progress      Therapeutic Modalities:   Cognitive Behavioral Therapy Solution Focused Therapy Motivational Interviewing Relapse Prevention Therapy  Lorri Frederick, LCSW 03/12/2023 2:26 PM

## 2023-03-12 NOTE — Progress Notes (Signed)
   03/12/23 0630  15 Minute Checks  Location Bedroom  Visual Appearance Calm  Behavior Sleeping  Sleep (Behavioral Health Patients Only)  Calculate sleep? (Click Yes once per 24 hr at 0600 safety check) Yes  Documented sleep last 24 hours 7.75

## 2023-03-13 DIAGNOSIS — F1994 Other psychoactive substance use, unspecified with psychoactive substance-induced mood disorder: Secondary | ICD-10-CM | POA: Diagnosis not present

## 2023-03-13 NOTE — Plan of Care (Signed)
   Problem: Education: Goal: Knowledge of General Education information will improve Description Including pain rating scale, medication(s)/side effects and non-pharmacologic comfort measures Outcome: Progressing   Problem: Health Behavior/Discharge Planning: Goal: Ability to manage health-related needs will improve Outcome: Progressing

## 2023-03-13 NOTE — Progress Notes (Signed)
   03/13/23 1000  Psych Admission Type (Psych Patients Only)  Admission Status Voluntary  Psychosocial Assessment  Patient Complaints Depression;Anxiety  Eye Contact Fair  Facial Expression Other (Comment) (appropriate)  Affect Appropriate to circumstance  Speech Logical/coherent  Interaction Assertive  Motor Activity Other (Comment) (WNL)  Appearance/Hygiene In scrubs  Behavior Characteristics Cooperative  Mood Pleasant  Thought Process  Coherency WDL  Content WDL  Delusions None reported or observed  Perception WDL  Hallucination None reported or observed  Judgment WDL  Confusion None  Danger to Self  Current suicidal ideation? Denies  Agreement Not to Harm Self Yes  Description of Agreement verbal  Danger to Others  Danger to Others None reported or observed

## 2023-03-13 NOTE — Progress Notes (Signed)
 [facility] MD Progress Note  03/13/2023 12:20 PM Franklin [name]  MRN:  [id number]  Franklin [name] is a 53 y.o. male presenting to [facility] emergency department on 02/27/2023 2:25 AM for suicidal ideations. Patient reports going to take some pills or either cut my wrist. Patient states I took some pills a few days ago but I guess it was not enough. He has a psychiatric history of major depressive disorder, suicidal ideation, cocaine use disorder, alcohol use disorder, tobacco abuse, and generalized anxiety disorder .  Subjective:  Chart reviewed, case discussed in multidisciplinary meeting, patient seen during rounds.  Patient reports having a good night sleep.  He is able to tolerate melatonin without any side effects.  He denies feeling depressed or anxious.  He is taking his medications with no problems.  He reports that tomorrow he will be able to call the rehab place and get more details.  He denies SI/HI/plan.  He is participating in groups.  Per nursing, no behavioral outbursts. Sleep: Fair  Appetite:  Fair  Past Psychiatric History: see h&P Family History:  Family History  Problem Relation Age of Onset   Hypertension Other    Diabetes Other    Social History:  Social History   Substance and Sexual Activity  Alcohol Use Yes   Alcohol/week: 12.0 standard drinks of alcohol   Types: 12 Cans of beer per week     Social History   Substance and Sexual Activity  Drug Use Yes   Types: "Crack" cocaine, Cocaine   Comment: one gram daily    Social History   Socioeconomic History   Marital status: Single    Spouse name: Not on file   Number of children: Not on file   Years of education: Not on file   Highest education level: Not on file  Occupational History   Occupation: [employer]  Tobacco Use   Smoking status: Some Days    Current packs/day: 0.50    Types: Cigarettes   Smokeless tobacco: Never  Vaping Use   Vaping status: Never Used  Substance and Sexual Activity    Alcohol use: Yes    Alcohol/week: 12.0 standard drinks of alcohol    Types: 12 Cans of beer per week   Drug use: Yes    Types: "Crack" cocaine, Cocaine    Comment: one gram daily   Sexual activity: Yes  Other Topics Concern   Not on file  Social History Narrative   The patient was born and raised in with that by both his biological parents. His father's past way but his mother still living. He denies any history of any physical or sexual abuse. He says he completed 2 years of college at [employer]. 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 He has never been married but has a 10 year old son who lives with his mother. He says he does get to see his son and has a relationship with him. He is not currently dating or in a relationship.      The patient does have a history of a DUI and had a court date last Friday for a DUI. He denies any other pending charges.            Social Drivers of Corporate investment banker Strain: Not on file  Food Insecurity: Food Insecurity Present (02/27/2023)   Hunger Vital Sign    Worried About Running Out  of Food in the Last Year: Sometimes true    Ran Out of Food in the Last Year: Sometimes true  Transportation Needs: Unmet Transportation Needs (02/27/2023)   PRAPARE - Administrator, Civil Service (Medical): Yes    Lack of Transportation (Non-Medical): Yes  Physical Activity: Not on file  Stress: Not on file  Social Connections: Patient Declined (02/27/2023)   Social Connection and Isolation Panel [NHANES]    Frequency of Communication with Friends and Family: Patient declined    Frequency of Social Gatherings with Friends and Family: Patient declined    Attends Religious Services: Patient declined    Database administrator or Organizations: Patient declined    Attends Engineer, structural: Patient declined    Marital Status: Patient declined   Past  Medical History:  Past Medical History:  Diagnosis Date   Anxiety    COPD (chronic obstructive pulmonary disease) (HCC)    Depression    Substance abuse (HCC)     Past Surgical History:  Procedure Laterality Date   NO PAST SURGERIES      Current Medications: Current Facility-Administered Medications  Medication Dose Route Frequency Provider Last Rate Last Admin   acetaminophen (TYLENOL) tablet 650 mg  650 mg Oral Q6H PRN Penn, Cicely, NP       albuterol (PROVENTIL) (2.5 MG/3ML) 0.083% nebulizer solution 2.5 mg  2.5 mg Inhalation Q4H PRN Penn, Cranston Neighbor, NP   2.5 mg at 03/12/23 0925   alum & mag hydroxide-simeth (MAALOX/MYLANTA) 200-200-20 MG/5ML suspension 30 mL  30 mL Oral Q4H PRN Penn, Cranston Neighbor, NP       FLUoxetine (PROZAC) capsule 30 mg  30 mg Oral Daily Verner Chol, MD   30 mg at 03/13/23 0945   magnesium hydroxide (MILK OF MAGNESIA) suspension 30 mL  30 mL Oral Daily PRN Penn, Cranston Neighbor, NP       melatonin tablet 5 mg  5 mg Oral QHS Verner Chol, MD   5 mg at 03/12/23 2128   OLANZapine (ZYPREXA) injection 5 mg  5 mg Intramuscular TID PRN Penn, Cranston Neighbor, NP       OLANZapine zydis (ZYPREXA) disintegrating tablet 5 mg  5 mg Oral TID PRN Penn, Cicely, NP       Vitamin D (Ergocalciferol) (DRISDOL) 1.25 MG (50000 UNIT) capsule 50,000 Units  50,000 Units Oral Weekly Verner Chol, MD   50,000 Units at 03/10/23 2240    Lab Results: No results found for this or any previous visit (from the past 48 hours).  Blood Alcohol level:  Lab Results  Component Value Date   ETH <10 02/27/2023   ETH <10 02/19/2023    Metabolic Disorder Labs: Lab Results  Component Value Date   HGBA1C 5.6 03/05/2023   MPG 114.02 03/05/2023   MPG 117 02/02/2009   No results found for: "PROLACTIN" Lab Results  Component Value Date   CHOL 193 03/05/2023   TRIG 178 (H) 03/05/2023   HDL 52 03/05/2023   CHOLHDL 3.7 03/05/2023   VLDL 36 03/05/2023   LDLCALC 105 (H) 03/05/2023   LDLCALC 80 10/31/2022     Physical Findings: AIMS:  , ,  ,  ,    CIWA:    COWS:      Psychiatric Specialty Exam:  Presentation  General Appearance:  Appropriate for Environment; Casual  Eye Contact: Fair  Speech: Clear and Coherent  Speech Volume: Normal    Mood and Affect  Mood: Dysphoric  Affect: Depressed   Thought Process  Thought Processes: Coherent  Descriptions of Associations:Intact  Orientation:Full (Time, Place and Person)  Thought Content:Logical  Hallucinations:Hallucinations: None  Ideas of Reference:None  Suicidal Thoughts:Suicidal Thoughts: No  Homicidal Thoughts:Homicidal Thoughts: No   Sensorium  Memory: Immediate Fair; Recent Fair; Remote Fair  Judgment: Impaired  Insight: None   Executive Functions  Concentration: Fair  Attention Span: Fair  Recall: [employer] of Knowledge: Fair  Language: Fair   Psychomotor Activity  Psychomotor Activity: Psychomotor Activity: Normal  Musculoskeletal: Strength & Muscle Tone: within normal limits Gait & Station: normal Assets  Assets: Manufacturing systems engineer; Financial Resources/Insurance; Resilience    Physical Exam: Physical Exam Vitals and nursing note reviewed.  HENT:     Head: Normocephalic.     Nose: Nose normal.     Mouth/Throat:     Mouth: Mucous membranes are moist.  Eyes:     Pupils: Pupils are equal, round, and reactive to light.  Cardiovascular:     Rate and Rhythm: Normal rate.  Pulmonary:     Breath sounds: Normal breath sounds.  Abdominal:     General: Bowel sounds are normal.  Skin:    General: Skin is warm.  Neurological:     General: No focal deficit present.     Mental Status: He is alert.    Review of Systems  Constitutional: Negative.   HENT: Negative.    Eyes: Negative.   Respiratory: Negative.    Cardiovascular: Negative.   Gastrointestinal: Negative.   Skin: Negative.   Neurological: Negative.    Blood pressure 121/66, pulse (!) 59,  temperature 99.1 F (37.3 C), resp. rate 18, height 5\' 10"  (1.778 m), weight 73.5 kg, SpO2 98%. Body mass index is 23.24 kg/m.  MDD (major depressive disorder), Recurrent severe with out psychosis Vs Substance induced mood disorder Alcohol use disorder  Stimulant use disorder     Clinical Decision Making: Patient with history of depression, polysubstance use presented today for worsening depression, suicidal ideation.  He endorses hopelessness and anhedonia with worsening anxiety.  Given polysubstance use is noted to be impulsive in nature.  He carries a moderate risk for suicide.  Patient is currently admitted to geropsych inpatient unit.   Treatment Plan Summary:   Safety and Monitoring:             -- Voluntary admission to inpatient psychiatric unit for safety, stabilization and treatment             -- Daily contact with patient to assess and evaluate symptoms and progress in treatment             -- Patient's case to be discussed in multi-disciplinary team meeting             -- Observation Level: q15 minute checks             -- Vital signs:  q12 hours             -- Precautions: suicide, elopement, and assault   2. Psychiatric Diagnoses and Treatment:    Prozac 30 mg to optimize the treatment for depression.             Patient reports being on her Risperdal and Prozac Patient is not currently psychotic so will hold off Risperdal at this time     -- The risks/benefits/side-effects/alternatives to this medication were discussed in detail with the patient and time was given for questions. The patient consents to medication trial.                --  Metabolic profile and EKG monitoring obtained while on an atypical antipsychotic (BMI: Lipid Panel: HbgA1c: QTc:)              -- Encouraged patient to participate in unit milieu and in scheduled group therapies                            3. Medical Issues Being Addressed:  No medical emergent needs noted   4. Discharge Planning:               -- Social work and case management to assist with discharge planning and identification of hospital follow-up needs prior to discharge             -- Estimated LOS: 5-7 days             -- Discharge Concerns: Need to establish a safety plan; Medication compliance and effectiveness             -- Discharge Goals: Return home with outpatient referrals follow ups    Long Term Goal(s): Improvement in symptoms so as ready for discharge   Short Term Goals: Ability to identify changes in lifestyle to reduce recurrence of condition will improve, Ability to verbalize feelings will improve, Ability to disclose and discuss suicidal ideas, Ability to demonstrate self-control will improve, Ability to identify and develop effective coping behaviors will improve, Ability to maintain clinical measurements within normal limits will improve, Compliance with prescribed medications will improve, and Ability to identify triggers associated with substance abuse/mental health issues will improve  Verner Chol, MD 03/13/2023, 12:20 PM

## 2023-03-13 NOTE — Group Note (Signed)
 Date:  03/13/2023 Time:  10:36 PM  Group Topic/Focus:  Wrap-Up Group:   The focus of this group is to help patients review their daily goal of treatment and discuss progress on daily workbooks.    Participation Level:  Active  Participation Quality:  Appropriate  Affect:  Appropriate  Cognitive:  Alert  Insight: Appropriate  Engagement in Group:  Engaged  Modes of Intervention:  Discussion  Additional Comments:    Maeola Harman 03/13/2023, 10:36 PM

## 2023-03-13 NOTE — [facility] Group Notes (Signed)
 LCSW Wellness Group Note   03/13/2023 1:00pm  Type of Group and Topic: Psychoeducational Group:  Wellness  Participation Level:  did not attend  Description of Group  Wellness group introduces the topic and its focus on developing healthy habits across the spectrum and its relationship to a decrease in hospital admissions.  Six areas of wellness are discussed: physical, social spiritual, intellectual, occupational, and emotional.  Patients are asked to consider their current wellness habits and to identify areas of wellness where they are interested and able to focus on improvements.    Therapeutic Goals Patients will understand components of wellness and how they can positively impact overall health.  Patients will identify areas of wellness where they have developed good habits. Patients will identify areas of wellness where they would like to make improvements.    Summary of Patient Progress     Therapeutic Modalities: Cognitive Behavioral Therapy Psychoeducation    Lorri Frederick, LCSW

## 2023-03-13 NOTE — Plan of Care (Signed)

## 2023-03-14 DIAGNOSIS — F1994 Other psychoactive substance use, unspecified with psychoactive substance-induced mood disorder: Secondary | ICD-10-CM | POA: Diagnosis not present

## 2023-03-14 NOTE — Group Note (Signed)
 Recreation Therapy Group Note   Group Topic:Health and Wellness  Group Date: 03/14/2023 Start Time: 1400 End Time: 1450 Facilitators: Rosina Lowenstein, LRT, CTRS Location:  Dayroom  Activity Description/Intervention: Therapeutic Drumming. Patients with peers and staff were given the opportunity to engage in a leader facilitated [facility] Group Empowerment Drumming Circle with staff from the [employer], in partnership with [employer]. Teaching laboratory technician and trained [employer], Theodoro Doing leading with LRT observing and documenting intervention and pt response. This evidenced-based practice targets 7 areas of health and wellbeing in the human experience including: stress-reduction, exercise, self-expression, camaraderie/support, nurturing, spirituality, and music-making (leisure).    Goal Area(s) Addresses:  Patient will engage in pro-social way in music group.  Patient will follow directions of drum leader on the first prompt. Patient will demonstrate no behavioral issues during group.  Patient will identify if a reduction in stress level occurs as a result of participation in therapeutic drum circle.     Affect/Mood: Appropriate   Participation Level: Active   Participation Quality: Independent   Behavior: Appropriate   Speech/Thought Process: Coherent   Insight: Fair   Judgement: Fair    Modes of Intervention: Music   Patient Response to Interventions:  Receptive   Education Outcome:  Acknowledges education   Clinical Observations/Individualized Feedback: Emmanuell was active in their participation of session activities and group discussion. Pt interacted well with LRT and peers duration of session.    Plan: Continue to engage patient in RT group sessions 2-3x/week.   Rosina Lowenstein, LRT, CTRS 03/14/2023 5:07 PM

## 2023-03-14 NOTE — Progress Notes (Signed)
 [facility] MD Progress Note  03/14/2023 9:30 AM Franklin [name]  MRN:  [id number]  Franklin [name] is a 53 y.o. male presenting to [facility] emergency department on 02/27/2023 2:25 AM for suicidal ideations. Patient reports going to take some pills or either cut my wrist. Patient states I took some pills a few days ago but I guess it was not enough. He has a psychiatric history of major depressive disorder, suicidal ideation, cocaine use disorder, alcohol use disorder, tobacco abuse, and generalized anxiety disorder .  Subjective:  Chart reviewed, case discussed in multidisciplinary meeting, patient seen during rounds.  Patient reports that he is in a good mood.  He reports that he was able to talk to his sister who lives in [place] and she informed him that she will come from [place] on March 17 to pick him up from the rehab to take him to the court hearing and once he completes the hearing he is planning to go to [facility].  He denies feeling depressed or anxious.  He denies SI/HI/plan.  He denies auditory/visual hallucinations.  He reports doing well on Prozac and acknowledges that if he starts hearing voices upon discharge he will reach out to [facility] outpatient.  To get back on his Risperdal.  He did acknowledge that as of now there is no indication for Risperdal.  Per nursing report patient is doing well and participating in groups. Sleep: Fair  Appetite:  Fair  Past Psychiatric History: see h&P Family History:  Family History  Problem Relation Age of Onset   Hypertension Other    Diabetes Other    Social History:  Social History   Substance and Sexual Activity  Alcohol Use Yes   Alcohol/week: 12.0 standard drinks of alcohol   Types: 12 Cans of beer per week     Social History   Substance and Sexual Activity  Drug Use Yes   Types: "Crack" cocaine, Cocaine   Comment: one gram daily    Social History   Socioeconomic History   Marital status: Single     Spouse name: Not on file   Number of children: Not on file   Years of education: Not on file   Highest education level: Not on file  Occupational History   Occupation: [employer]  Tobacco Use   Smoking status: Some Days    Current packs/day: 0.50    Types: Cigarettes   Smokeless tobacco: Never  Vaping Use   Vaping status: Never Used  Substance and Sexual Activity   Alcohol use: Yes    Alcohol/week: 12.0 standard drinks of alcohol    Types: 12 Cans of beer per week   Drug use: Yes    Types: "Crack" cocaine, Cocaine    Comment: one gram daily   Sexual activity: Yes  Other Topics Concern   Not on file  Social History Narrative   The patient was born and raised in with that by both his biological parents. His father's past way but his mother still living. He denies any history of any physical or sexual abuse. He says he completed 2 years of college at [employer]. 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 He has never been married but has a 26 year old son who lives with his mother. He says he does get to see his son and has a relationship with him. He is not currently dating or in a  relationship.      The patient does have a history of a DUI and had a court date last Friday for a DUI. He denies any other pending charges.            Social Drivers of Corporate investment banker Strain: Not on file  Food Insecurity: Food Insecurity Present (02/27/2023)   Hunger Vital Sign    Worried About Running Out of Food in the Last Year: Sometimes true    Ran Out of Food in the Last Year: Sometimes true  Transportation Needs: Unmet Transportation Needs (02/27/2023)   PRAPARE - Administrator, Civil Service (Medical): Yes    Lack of Transportation (Non-Medical): Yes  Physical Activity: Not on file  Stress: Not on file  Social Connections: Patient Declined (02/27/2023)   Social Connection and Isolation Panel  [NHANES]    Frequency of Communication with Friends and Family: Patient declined    Frequency of Social Gatherings with Friends and Family: Patient declined    Attends Religious Services: Patient declined    Database administrator or Organizations: Patient declined    Attends Engineer, structural: Patient declined    Marital Status: Patient declined   Past Medical History:  Past Medical History:  Diagnosis Date   Anxiety    COPD (chronic obstructive pulmonary disease) (HCC)    Depression    Substance abuse (HCC)     Past Surgical History:  Procedure Laterality Date   NO PAST SURGERIES      Current Medications: Current Facility-Administered Medications  Medication Dose Route Frequency Provider Last Rate Last Admin   acetaminophen (TYLENOL) tablet 650 mg  650 mg Oral Q6H PRN Penn, Cicely, NP       albuterol (PROVENTIL) (2.5 MG/3ML) 0.083% nebulizer solution 2.5 mg  2.5 mg Inhalation Q4H PRN Penn, Cranston Neighbor, NP   2.5 mg at 03/12/23 0925   alum & mag hydroxide-simeth (MAALOX/MYLANTA) 200-200-20 MG/5ML suspension 30 mL  30 mL Oral Q4H PRN Penn, Cranston Neighbor, NP       FLUoxetine (PROZAC) capsule 30 mg  30 mg Oral Daily Verner Chol, MD   30 mg at 03/14/23 8413   magnesium hydroxide (MILK OF MAGNESIA) suspension 30 mL  30 mL Oral Daily PRN Penn, Cranston Neighbor, NP       melatonin tablet 5 mg  5 mg Oral QHS Verner Chol, MD   5 mg at 03/13/23 2126   OLANZapine (ZYPREXA) injection 5 mg  5 mg Intramuscular TID PRN Penn, Cranston Neighbor, NP       OLANZapine zydis (ZYPREXA) disintegrating tablet 5 mg  5 mg Oral TID PRN Penn, Cicely, NP       Vitamin D (Ergocalciferol) (DRISDOL) 1.25 MG (50000 UNIT) capsule 50,000 Units  50,000 Units Oral Weekly Verner Chol, MD   50,000 Units at 03/10/23 2240    Lab Results: No results found for this or any previous visit (from the past 48 hours).  Blood Alcohol level:  Lab Results  Component Value Date   ETH <10 02/27/2023   ETH <10 02/19/2023    Metabolic  Disorder Labs: Lab Results  Component Value Date   HGBA1C 5.6 03/05/2023   MPG 114.02 03/05/2023   MPG 117 02/02/2009   No results found for: "PROLACTIN" Lab Results  Component Value Date   CHOL 193 03/05/2023   TRIG 178 (H) 03/05/2023   HDL 52 03/05/2023   CHOLHDL 3.7 03/05/2023   VLDL 36 03/05/2023   LDLCALC 105 (H) 03/05/2023  LDLCALC 80 10/31/2022    Physical Findings: AIMS:  , ,  ,  ,    CIWA:    COWS:      Psychiatric Specialty Exam:  Presentation  General Appearance:  Appropriate for Environment; Casual  Eye Contact: Fair  Speech: Clear and Coherent  Speech Volume: Normal    Mood and Affect  Mood: Euthymic  Affect: Appropriate   Thought Process  Thought Processes: Coherent  Descriptions of Associations:Intact  Orientation:Full (Time, Place and Person)  Thought Content:Logical  Hallucinations:Hallucinations: None  Ideas of Reference:None  Suicidal Thoughts:Suicidal Thoughts: No  Homicidal Thoughts:Homicidal Thoughts: No   Sensorium  Memory: Immediate Fair; Recent Fair; Remote Fair  Judgment: Impaired  Insight: Shallow   Executive Functions  Concentration: Fair  Attention Span: Fair  Recall: Fair  Fund of Knowledge: Fair  Language: Fair   Psychomotor Activity  Psychomotor Activity: Psychomotor Activity: Normal  Musculoskeletal: Strength & Muscle Tone: within normal limits Gait & Station: normal Assets  Assets: Manufacturing systems engineer; Financial Resources/Insurance; Physical Health    Physical Exam: Physical Exam Vitals and nursing note reviewed.  HENT:     Head: Normocephalic.     Nose: Nose normal.     Mouth/Throat:     Mouth: Mucous membranes are moist.  Eyes:     Pupils: Pupils are equal, round, and reactive to light.  Cardiovascular:     Rate and Rhythm: Normal rate.  Pulmonary:     Breath sounds: Normal breath sounds.  Abdominal:     General: Bowel sounds are normal.  Skin:     General: Skin is warm.  Neurological:     General: No focal deficit present.     Mental Status: He is alert.    Review of Systems  Constitutional: Negative.   HENT: Negative.    Eyes: Negative.   Respiratory: Negative.    Cardiovascular: Negative.   Gastrointestinal: Negative.   Skin: Negative.   Neurological: Negative.    Blood pressure 134/78, pulse 60, temperature 98.1 F (36.7 C), resp. rate 18, height 5\' 10"  (1.778 m), weight 73.5 kg, SpO2 97%. Body mass index is 23.24 kg/m.  MDD (major depressive disorder), Recurrent severe with out psychosis Vs Substance induced mood disorder Alcohol use disorder  Stimulant use disorder     Clinical Decision Making: Patient with history of depression, polysubstance use presented today for worsening depression, suicidal ideation.  He endorses hopelessness and anhedonia with worsening anxiety.  Given polysubstance use is noted to be impulsive in nature.  He carries a moderate risk for suicide.  Patient is currently admitted to geropsych inpatient unit.   Treatment Plan Summary:   Safety and Monitoring:             -- Voluntary admission to inpatient psychiatric unit for safety, stabilization and treatment             -- Daily contact with patient to assess and evaluate symptoms and progress in treatment             -- Patient's case to be discussed in multi-disciplinary team meeting             -- Observation Level: q15 minute checks             -- Vital signs:  q12 hours             -- Precautions: suicide, elopement, and assault   2. Psychiatric Diagnoses and Treatment:    Prozac 30 mg to optimize the treatment for  depression.             Patient reports being on her Risperdal and Prozac Patient is not currently psychotic so will hold off Risperdal at this time     -- The risks/benefits/side-effects/alternatives to this medication were discussed in detail with the patient and time was given for questions. The patient consents to  medication trial.                -- Metabolic profile and EKG monitoring obtained while on an atypical antipsychotic (BMI: Lipid Panel: HbgA1c: QTc:)              -- Encouraged patient to participate in unit milieu and in scheduled group therapies                            3. Medical Issues Being Addressed:  No medical emergent needs noted   4. Discharge Planning:              -- Social work and case management to assist with discharge planning and identification of hospital follow-up needs prior to discharge             -- Estimated LOS: 5-7 days             -- Discharge Concerns: Need to establish a safety plan; Medication compliance and effectiveness             -- Discharge Goals: Return home with outpatient referrals follow ups    Long Term Goal(s): Improvement in symptoms so as ready for discharge   Short Term Goals: Ability to identify changes in lifestyle to reduce recurrence of condition will improve, Ability to verbalize feelings will improve, Ability to disclose and discuss suicidal ideas, Ability to demonstrate self-control will improve, Ability to identify and develop effective coping behaviors will improve, Ability to maintain clinical measurements within normal limits will improve, Compliance with prescribed medications will improve, and Ability to identify triggers associated with substance abuse/mental health issues will improve  Verner Chol, MD 03/14/2023, 9:30 AM

## 2023-03-14 NOTE — Progress Notes (Signed)
   03/14/23 2000  Psych Admission Type (Psych Patients Only)  Admission Status Voluntary  Psychosocial Assessment  Eye Contact Fair  Facial Expression Other (Comment) (Appropriate)  Affect Appropriate to circumstance  Speech Logical/coherent  Interaction Assertive  Motor Activity Other (Comment) (WDL)  Appearance/Hygiene In scrubs  Thought Process  Coherency WDL  Content WDL  Delusions None reported or observed  Perception WDL  Hallucination None reported or observed  Judgment WDL  Confusion None  Danger to Self  Current suicidal ideation? Denies  Agreement Not to Harm Self Yes  Description of Agreement verbal  Danger to Others  Danger to Others None reported or observed

## 2023-03-14 NOTE — Plan of Care (Signed)
   Problem: Education: Goal: Knowledge of General Education information will improve Description Including pain rating scale, medication(s)/side effects and non-pharmacologic comfort measures Outcome: Progressing   Problem: Health Behavior/Discharge Planning: Goal: Ability to manage health-related needs will improve Outcome: Progressing

## 2023-03-14 NOTE — Plan of Care (Signed)

## 2023-03-14 NOTE — Progress Notes (Signed)
   03/14/23 1400  Psych Admission Type (Psych Patients Only)  Admission Status Voluntary  Psychosocial Assessment  Patient Complaints Anxiety  Eye Contact Fair  Facial Expression Other (Comment) (appropriate)  Affect Appropriate to circumstance  Speech Logical/coherent  Interaction Assertive  Motor Activity Other (Comment) (WNL)  Appearance/Hygiene In scrubs  Behavior Characteristics Cooperative  Mood Pleasant  Thought Process  Coherency WDL  Content WDL  Delusions None reported or observed  Perception WDL  Hallucination None reported or observed  Judgment WDL  Confusion None  Danger to Self  Current suicidal ideation? Denies  Agreement Not to Harm Self Yes  Description of Agreement verbal  Danger to Others  Danger to Others None reported or observed

## 2023-03-15 ENCOUNTER — Other Ambulatory Visit: Payer: Self-pay

## 2023-03-15 DIAGNOSIS — F1994 Other psychoactive substance use, unspecified with psychoactive substance-induced mood disorder: Secondary | ICD-10-CM | POA: Diagnosis not present

## 2023-03-15 MED ORDER — ALBUTEROL SULFATE (2.5 MG/3ML) 0.083% IN NEBU
2.5000 mg | INHALATION_SOLUTION | RESPIRATORY_TRACT | 12 refills | Status: DC | PRN
  Filled 2023-03-15: qty 75, 5d supply, fill #0

## 2023-03-15 MED ORDER — FLUOXETINE HCL 10 MG PO CAPS
30.0000 mg | ORAL_CAPSULE | Freq: Every day | ORAL | 0 refills | Status: DC
  Filled 2023-03-15: qty 30, 10d supply, fill #0

## 2023-03-15 MED ORDER — VITAMIN D (ERGOCALCIFEROL) 1.25 MG (50000 UNIT) PO CAPS
50000.0000 [IU] | ORAL_CAPSULE | ORAL | 0 refills | Status: DC
  Filled 2023-03-15: qty 5, 35d supply, fill #0

## 2023-03-15 MED ORDER — MELATONIN 5 MG PO TABS
5.0000 mg | ORAL_TABLET | Freq: Every day | ORAL | 0 refills | Status: DC
  Filled 2023-03-15: qty 30, 30d supply, fill #0

## 2023-03-15 NOTE — Discharge Summary (Signed)
 Physician Discharge Summary Note  Patient:  Franklin [name] is an 53 y.o., male MRN:  [id number] DOB:  [birth year] Patient phone:  [phone] (home)  Patient address:   [place] [address],    Date of Admission:  02/27/2023 Date of Discharge: 03/15/23  Reason for Admission:  Franklin [name] is a 53 y.o. male presenting to [facility] emergency department on 02/27/2023 2:25 AM for suicidal ideations. Patient reports going to take some pills or either cut my wrist. Patient states I took some pills a few days ago but I guess it was not enough. He has a psychiatric history of major depressive disorder, suicidal ideation, cocaine use disorder, alcohol use disorder, tobacco abuse, and generalized anxiety disorder .   Principal Problem: <principal problem not specified> Discharge Diagnoses: Active Problems:   MDD (major depressive disorder)   Past Psychiatric History: see h&p  Family Psychiatric  History: see h&p Social History:  Social History   Substance and Sexual Activity  Alcohol Use Yes   Alcohol/week: 12.0 standard drinks of alcohol   Types: 12 Cans of beer per week     Social History   Substance and Sexual Activity  Drug Use Yes   Types: "Crack" cocaine, Cocaine   Comment: one gram daily    Social History   Socioeconomic History   Marital status: Single    Spouse name: Not on file   Number of children: Not on file   Years of education: Not on file   Highest education level: Not on file  Occupational History   Occupation: [employer]  Tobacco Use   Smoking status: Some Days    Current packs/day: 0.50    Types: Cigarettes   Smokeless tobacco: Never  Vaping Use   Vaping status: Never Used  Substance and Sexual Activity   Alcohol use: Yes    Alcohol/week: 12.0 standard drinks of alcohol    Types: 12 Cans of beer per week   Drug use: Yes    Types: "Crack" cocaine, Cocaine    Comment: one gram daily   Sexual activity: Yes  Other Topics  Concern   Not on file  Social History Narrative   The patient was born and raised in with that by both his biological parents. His father's past way but his mother still living. He denies any history of any physical or sexual abuse. He says he completed 2 years of college at [employer]. 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 He has never been married but has a 77 year old son who lives with his mother. He says he does get to see his son and has a relationship with him. He is not currently dating or in a relationship.      The patient does have a history of a DUI and had a court date last Friday for a DUI. He denies any other pending charges.            Social Drivers of Corporate investment banker Strain: Not on file  Food Insecurity: Food Insecurity Present (02/27/2023)   Hunger Vital Sign    Worried About Running Out of Food in the Last Year: Sometimes true    Ran Out of Food in the Last Year: Sometimes true  Transportation Needs: Unmet Transportation Needs (02/27/2023)   PRAPARE - Administrator, Civil Service (Medical): Yes    Lack of Transportation (Non-Medical): Yes  Physical Activity: Not on file  Stress: Not on file  Social Connections: Patient Declined (02/27/2023)   Social Connection and Isolation Panel [NHANES]    Frequency of Communication with Friends and Family: Patient declined    Frequency of Social Gatherings with Friends and Family: Patient declined    Attends Religious Services: Patient declined    Database administrator or Organizations: Patient declined    Attends Engineer, structural: Patient declined    Marital Status: Patient declined   Past Medical History:  Past Medical History:  Diagnosis Date   Anxiety    COPD (chronic obstructive pulmonary disease) (HCC)    Depression    Substance abuse (HCC)     Past Surgical History:  Procedure Laterality Date    NO PAST SURGERIES     Family History:  Family History  Problem Relation Age of Onset   Hypertension Other    Diabetes Other     Hospital Course:  Franklin [name] is a 53 y.o. male presenting to [facility] emergency department on 02/27/2023 2:25 AM for suicidal ideations. Patient reports going to take some pills or either cut my wrist. Patient states I took some pills a few days ago but I guess it was not enough. He has a psychiatric history of major depressive disorder, suicidal ideation, cocaine use disorder, alcohol use disorder, tobacco abuse, and generalized anxiety disorder .   Physical Findings: AIMS:  , ,  ,  ,    CIWA:    COWS:        Psychiatric Specialty Exam:  Presentation  General Appearance:  Appropriate for Environment; Casual  Eye Contact: Fair  Speech: Clear and Coherent  Speech Volume: Normal    Mood and Affect  Mood: Euthymic  Affect: Appropriate   Thought Process  Thought Processes: Coherent  Descriptions of Associations:Intact  Orientation:Full (Time, Place and Person)  Thought Content:Logical  Hallucinations:Hallucinations: None  Ideas of Reference:None  Suicidal Thoughts:Suicidal Thoughts: No  Homicidal Thoughts:Homicidal Thoughts: No   Sensorium  Memory: Immediate Fair; Recent Fair; Remote Fair  Judgment: Fair  Insight: Fair   Art therapist  Concentration: Fair  Attention Span: Fair  Recall: [employer] of Knowledge: Fair  Language: Fair   Psychomotor Activity  Psychomotor Activity: Psychomotor Activity: Normal  Musculoskeletal: Strength & Muscle Tone: {desc; muscle tone:32375} Gait & Station: {PE GAIT ED ZOXW:96045} Assets  Assets: Communication Skills; Desire for Improvement; Resilience   Sleep  Sleep: Sleep: Fair    Physical Exam: Physical Exam ROS Blood pressure 107/64, pulse (!) 59, temperature 99.1 F (37.3 C), resp. rate 18, height 5\' 10"  (1.778 m),  weight 73.5 kg, SpO2 98%. Body mass index is 23.24 kg/m.   Social History   Tobacco Use  Smoking Status Some Days   Current packs/day: 0.50   Types: Cigarettes  Smokeless Tobacco Never   Tobacco Cessation:  {Discharge tobacco cessation prescription:304700209}   Blood Alcohol level:  Lab Results  Component Value Date   ETH <10 02/27/2023   ETH <10 02/19/2023    Metabolic Disorder Labs:  Lab Results  Component Value Date   HGBA1C 5.6 03/05/2023   MPG 114.02 03/05/2023   MPG 117 02/02/2009   No results found for: "PROLACTIN" Lab Results  Component Value Date   CHOL 193 03/05/2023   TRIG 178 (H) 03/05/2023   HDL 52 03/05/2023   CHOLHDL 3.7 03/05/2023   VLDL 36 03/05/2023   LDLCALC 105 (H) 03/05/2023   LDLCALC 80 10/31/2022  See Psychiatric Specialty Exam and Suicide Risk Assessment completed by Attending Physician prior to discharge.  Discharge destination:  Other:  with sister  Is patient on multiple antipsychotic therapies at discharge:  No   Has Patient had three or more failed trials of antipsychotic monotherapy by history:  No  Recommended Plan for Multiple Antipsychotic Therapies: NA  Discharge Instructions     Diet - low sodium heart healthy   Complete by: As directed    Increase activity slowly   Complete by: As directed       Allergies as of 03/15/2023   No Known Allergies      Medication List     STOP taking these medications    albuterol 108 (90 Base) MCG/ACT inhaler Commonly known as: VENTOLIN HFA   amitriptyline 25 MG tablet Commonly known as: ELAVIL   ARIPiprazole 5 MG tablet Commonly known as: ABILIFY   azithromycin 250 MG tablet Commonly known as: ZITHROMAX   chlordiazePOXIDE 25 MG capsule Commonly known as: LIBRIUM   Dulera 200-5 MCG/ACT Aero Generic drug: mometasone-formoterol   IBU 800 MG tablet Generic drug: ibuprofen   predniSONE 50 MG tablet Commonly known as: DELTASONE   risperiDONE 1 MG tablet Commonly  known as: RISPERDAL   traZODone 50 MG tablet Commonly known as: DESYREL   Ventolin HFA 108 (90 Base) MCG/ACT inhaler Generic drug: albuterol Replaced by: albuterol (2.5 MG/3ML) 0.083% nebulizer solution       TAKE these medications      Indication  albuterol (2.5 MG/3ML) 0.083% nebulizer solution Commonly known as: PROVENTIL Inhale 3 mLs (2.5 mg total) into the lungs every 4 (four) hours as needed for wheezing or shortness of breath. Replaces: Ventolin HFA 108 (90 Base) MCG/ACT inhaler    FLUoxetine 10 MG capsule Commonly known as: PROZAC Take 3 capsules (30 mg total) by mouth daily. Start taking on: March 16, 2023 What changed:  medication strength how much to take    melatonin 5 MG Tabs Take 1 tablet (5 mg total) by mouth at bedtime.    Vitamin D (Ergocalciferol) 1.25 MG (50000 UNIT) Caps capsule Commonly known as: DRISDOL Take 1 capsule (50,000 Units total) by mouth once a week. Start taking on: March 18, 2023         Follow-up Information     Services, [facility]. Go to.   Why: Your appointment is scheduled for tomorrow at 2:00 PM . Please remember to bring you insurance card and approved belongings. Contact information: [address] [place] [address] [phone]                 Follow-up recommendations:  Activity:  As tolerated    Signed: Verner Chol, MD 03/15/2023, 10:29 AM

## 2023-03-15 NOTE — Progress Notes (Addendum)
  [facility] Adult Case Management Discharge Plan :  Will you be returning to the same living situation after discharge:  Yes,  pt will live with his sister  At discharge, do you have transportation home?: Yes,  pt's sister will pick him up  Do you have the ability to pay for your medications: Yes,  [employer] / [employer]  Release of information consent forms completed and in the chart;  Patient's signature needed at discharge.  Patient to Follow up at:  Follow-up Information     [facility] [employer]. Go to.   Why: Although you declined scheduled follow-up, I am putting walk-in hours for [facility] Contact information: [address] [place] [address] [phone]                8 AM  to 5 PM Next level of care provider has access to [facility] Link:no  Safety Planning and Suicide Prevention discussed: Yes,  CSW went over SPE with pt     Has patient been referred to the Quitline?: Patient does not use tobacco/nicotine products  Patient has been referred for addiction treatment: Patient refused referral for treatment; referral information given to patient at discharge.  [address] LCSWA 03/15/2023, 12:24 PM

## 2023-03-15 NOTE — Group Note (Signed)
 Date:  03/15/2023 Time:  9:27 AM  Group Topic/Focus:  Movement therapy    Participation Level:  Did Not Attend    Rodena Goldmann 03/15/2023, 9:27 AM

## 2023-03-15 NOTE — [facility] Suicide Risk Assessment (Signed)
 [facility] Discharge Suicide Risk Assessment   Principal Problem: <principal problem not specified> Discharge Diagnoses: Active Problems:   MDD (major depressive disorder)   Total Time spent with patient: 30 minutes  Musculoskeletal: Strength & Muscle Tone: within normal limits Gait & Station: normal Patient leans: N/A  Psychiatric Specialty Exam  Presentation  General Appearance:  Appropriate for Environment; Casual  Eye Contact: Fair  Speech: Clear and Coherent  Speech Volume: Normal  Handedness: Right   Mood and Affect  Mood: Euthymic  Duration of Depression Symptoms: Greater than two weeks  Affect: Appropriate   Thought Process  Thought Processes: Coherent  Descriptions of Associations:Intact  Orientation:Full (Time, Place and Person)  Thought Content:Logical  History of Schizophrenia/Schizoaffective disorder:No  Duration of Psychotic Symptoms:Greater than six months  Hallucinations:Hallucinations: None  Ideas of Reference:None  Suicidal Thoughts:Suicidal Thoughts: No  Homicidal Thoughts:Homicidal Thoughts: No   Sensorium  Memory: Immediate Fair; Recent Fair; Remote Fair  Judgment: Impaired  Insight: Shallow   Executive Functions  Concentration: Fair  Attention Span: Fair  Recall: [employer] of Knowledge: Fair  Language: Fair   Psychomotor Activity  Psychomotor Activity: Psychomotor Activity: Normal   Assets  Assets: Communication Skills; Financial Resources/Insurance; Physical Health   Sleep  Sleep: Sleep: Fair   Physical Exam: Physical Exam ROS Blood pressure 107/64, pulse (!) 59, temperature 99.1 F (37.3 C), resp. rate 18, height 5\' 10"  (1.778 m), weight 73.5 kg, SpO2 98%. Body mass index is 23.24 kg/m.  Mental Status Per Nursing Assessment::   On Admission:  Suicidal ideation indicated by patient  Demographic Factors:  Male and Low socioeconomic status  Loss Factors: Financial problems/change  in socioeconomic status  Historical Factors: Family history of mental illness or substance abuse  Risk Reduction Factors:   Sense of responsibility to family, Religious beliefs about death, Positive social support, and Positive coping skills or problem solving skills  Continued Clinical Symptoms:  Depression:   Comorbid alcohol abuse/dependence  Cognitive Features That Contribute To Risk:  Thought constriction (tunnel vision)    Suicide Risk:  Minimal: No identifiable suicidal ideation.  Patients presenting with no risk factors but with morbid ruminations; may be classified as minimal risk based on the severity of the depressive symptoms   Follow-up Information     Services, [facility]. Go to.   Why: Your appointment is scheduled for tomorrow at 2:00 PM . Please remember to bring you insurance card and approved belongings. Contact information: [address] [place] [address] [phone]                 Plan Of Care/Follow-up recommendations:  Activity:  As tolerated  Verner Chol, MD 03/15/2023, 10:27 AM

## 2023-03-15 NOTE — Group Note (Signed)
 Date:  03/15/2023 Time:  3:02 AM  Group Topic/Focus:  Wrap-Up Group:   The focus of this group is to help patients review their daily goal of treatment and discuss progress on daily workbooks.    Participation Level:  Active  Participation Quality:  Appropriate  Affect:  Appropriate  Cognitive:  Appropriate  Insight: Good  Engagement in Group:  Engaged  Modes of Intervention:  Discussion  Additional Comments:    Maeola Harman 03/15/2023, 3:02 AM

## 2023-03-15 NOTE — Progress Notes (Signed)
   03/15/23 0933  Psych Admission Type (Psych Patients Only)  Admission Status Voluntary  Psychosocial Assessment  Patient Complaints None  Eye Contact Brief  Facial Expression Animated  Affect Appropriate to circumstance  Speech Logical/coherent  Interaction Assertive  Appearance/Hygiene In scrubs  Behavior Characteristics Cooperative  Mood Pleasant  Thought Process  Coherency WDL  Content WDL  Delusions None reported or observed  Perception WDL  Hallucination None reported or observed  Judgment WDL  Confusion None  Danger to Self  Current suicidal ideation? Denies  Agreement Not to Harm Self Yes  Description of Agreement verbal  Danger to Others  Danger to Others None reported or observed   Patient discharged at this time in care of family. All discharge instructions given and read to patient with acknowledgment. Patient left with prescriptions dispensed by outpatient pharmacy.

## 2023-03-15 NOTE — Group Note (Signed)
 Recreation Therapy Group Note   Group Topic:Stress Management  Group Date: 03/15/2023 Start Time: 1215 End Time: 1300 Facilitators: Rosina Lowenstein, LRT, CTRS Location: Courtyard  Group Description: Outdoor Recreation. Patients had the option to play corn hole, ring toss, bowling or listening to music while outside in the courtyard getting fresh air and sunlight. LRT and patients discussed things that they enjoy doing in their free time outside of the hospital. LRT encouraged patients to drink water after being active and getting their heart rate up.   Goal Area(s) Addressed: Patient will identify leisure interests.  Patient will practice healthy decision making. Patient will engage in recreation activity.   Affect/Mood: Appropriate   Participation Level: Minimal    Clinical Observations/Individualized Feedback: [place] came outside for 5 minutes. Pt shared that he was looking forward to being discharged today.   Plan: Continue to engage patient in RT group sessions 2-3x/week.   Rosina Lowenstein, LRT, CTRS 03/15/2023 1:22 PM

## 2023-04-29 ENCOUNTER — Emergency Department: Payer: MEDICAID

## 2023-04-29 ENCOUNTER — Emergency Department
Admission: EM | Admit: 2023-04-29 | Discharge: 2023-04-29 | Disposition: A | Discharge status: Home / Self Care | Payer: MEDICAID | Attending: Emergency Medicine | Admitting: Emergency Medicine

## 2023-04-29 DIAGNOSIS — F1490 Cocaine use, unspecified, uncomplicated: Secondary | ICD-10-CM | POA: Insufficient documentation

## 2023-04-29 DIAGNOSIS — J441 Chronic obstructive pulmonary disease with (acute) exacerbation: Secondary | ICD-10-CM | POA: Diagnosis not present

## 2023-04-29 DIAGNOSIS — R0602 Shortness of breath: Secondary | ICD-10-CM | POA: Diagnosis present

## 2023-04-29 LAB — CBC WITH DIFFERENTIAL/PLATELET
Abs Immature Granulocytes: 0.02 10*3/uL (ref 0.00–0.07)
Basophils Absolute: 0.1 10*3/uL (ref 0.0–0.1)
Basophils Relative: 1 %
Eosinophils Absolute: 0.3 10*3/uL (ref 0.0–0.5)
Eosinophils Relative: 4 %
HCT: 42.7 % (ref 39.0–52.0)
Hemoglobin: 14.3 g/dL (ref 13.0–17.0)
Immature Granulocytes: 0 %
Lymphocytes Relative: 22 %
Lymphs Abs: 2 10*3/uL (ref 0.7–4.0)
MCH: 29 pg (ref 26.0–34.0)
MCHC: 33.5 g/dL (ref 30.0–36.0)
MCV: 86.6 fL (ref 80.0–100.0)
Monocytes Absolute: 1.1 10*3/uL — ABNORMAL HIGH (ref 0.1–1.0)
Monocytes Relative: 12 %
Neutro Abs: 5.8 10*3/uL (ref 1.7–7.7)
Neutrophils Relative %: 61 %
Platelets: 278 10*3/uL (ref 150–400)
RBC: 4.93 MIL/uL (ref 4.22–5.81)
RDW: 14.6 % (ref 11.5–15.5)
WBC: 9.3 10*3/uL (ref 4.0–10.5)
nRBC: 0 % (ref 0.0–0.2)

## 2023-04-29 LAB — COMPREHENSIVE METABOLIC PANEL WITH GFR
ALT: 19 U/L (ref 0–44)
AST: 25 U/L (ref 15–41)
Albumin: 4.4 g/dL (ref 3.5–5.0)
Alkaline Phosphatase: 64 U/L (ref 38–126)
Anion gap: 12 (ref 5–15)
BUN: 20 mg/dL (ref 6–20)
CO2: 22 mmol/L (ref 22–32)
Calcium: 9.5 mg/dL (ref 8.9–10.3)
Chloride: 104 mmol/L (ref 98–111)
Creatinine, Ser: 1.11 mg/dL (ref 0.61–1.24)
GFR, Estimated: >60 mL/min (ref >60)
Glucose, Bld: 101 mg/dL — ABNORMAL HIGH (ref 70–99)
Potassium: 3.8 mmol/L (ref 3.5–5.1)
Sodium: 138 mmol/L (ref 135–145)
Total Bilirubin: 2 mg/dL — ABNORMAL HIGH (ref 0.0–1.2)
Total Protein: 8.1 g/dL (ref 6.5–8.1)

## 2023-04-29 LAB — BRAIN NATRIURETIC PEPTIDE: B Natriuretic Peptide: 21.2 pg/mL (ref 0.0–100.0)

## 2023-04-29 LAB — TROPONIN I (HIGH SENSITIVITY): Troponin I (High Sensitivity): 6 ng/L (ref <18)

## 2023-04-29 MED ORDER — PREDNISONE 50 MG PO TABS: ORAL_TABLET | ORAL | 0 refills | Status: DC

## 2023-04-29 MED ORDER — ALBUTEROL SULFATE (2.5 MG/3ML) 0.083% IN NEBU: 2.5000 mg | INHALATION_SOLUTION | RESPIRATORY_TRACT | 2 refills | Status: DC | PRN

## 2023-04-29 MED ORDER — IPRATROPIUM-ALBUTEROL 0.5-2.5 (3) MG/3ML IN SOLN
6.0000 mL | Freq: Once | RESPIRATORY_TRACT | Status: AC
  Administered 2023-04-29: 6 mL via RESPIRATORY_TRACT
  Filled 2023-04-29: qty 3

## 2023-04-29 MED ORDER — IPRATROPIUM-ALBUTEROL 0.5-2.5 (3) MG/3ML IN SOLN: 3.0000 mL | Freq: Once | RESPIRATORY_TRACT | Status: DC

## 2023-04-29 MED ORDER — AEROCHAMBER MV MISC: 0 refills | Status: DC

## 2023-04-29 MED ORDER — DIAZEPAM 5 MG/ML IJ SOLN
5.0000 mg | Freq: Once | INTRAMUSCULAR | Status: AC
  Administered 2023-04-29: 5 mg via INTRAVENOUS
  Filled 2023-04-29: qty 2

## 2023-04-29 MED ORDER — ALBUTEROL SULFATE HFA 108 (90 BASE) MCG/ACT IN AERS: 2.0000 | INHALATION_SPRAY | Freq: Four times a day (QID) | RESPIRATORY_TRACT | 2 refills | Status: DC | PRN

## 2023-04-29 NOTE — Discharge Instructions (Signed)
Take medications as prescribed.  Thank you for choosing [place] for your health care today!  Please see your primary doctor this week for a follow up appointment.   If you have any new, worsening, or unexpected symptoms call your doctor right away or come back to the emergency department for reevaluation.  It was my pleasure to care for you today.   Daneil Dan Modesto Charon, MD

## 2023-04-29 NOTE — ED Notes (Signed)
 Answered call light, pt ask for food, advised pt NPO currently.

## 2023-04-29 NOTE — ED Triage Notes (Signed)
 [place] with c/o SOB. Pt reports smoking a "100 bag of crack" in the past 24 hours and has reported drinking 10 beers tonight. Pt reports SOB started after smoking crack tonight. EMS gave 1 duoneb and 125 mg solumedrol in route.

## 2023-04-29 NOTE — ED Provider Notes (Signed)
 [facility] Provider Note    Event Date/Time   First MD Initiated Contact with Patient 04/29/23 0127     (approximate)   History   Respiratory Distress   HPI  Franklin [name] is a 53 y.o. male   Past medical history of COPD and substance use who presents to the emergency department with shortness of breath after smoking crack cocaine tonight.  He states that he ran out of his inhaler and nebulizer treatments at home which he uses usually daily to keep his breathing comfortable, he ran out about 3 days ago.  Since then his breathing has been slightly worse and tonight acutely worse after smoking crack.  He got Solu-Medrol  and a DuoNeb en route by EMS and he feels much better now.  He is asking for food.  He is asking for refill of prescriptions.  He denies any respiratory infectious symptoms like cough, congestion, fevers or chills.  He denies chest pain.   External Medical Documents Reviewed: Behavioral health notes from March 2025 for psychiatric evaluation      Physical Exam   Triage Vital Signs: ED Triage Vitals  Encounter Vitals Group     BP 04/29/23 0131 (!) 138/100     Systolic BP Percentile --      Diastolic BP Percentile --      Pulse Rate 04/29/23 0131 (!) 104     Resp 04/29/23 0131 20     Temp 04/29/23 0131 98.7 F (37.1 C)     Temp Source 04/29/23 0131 Oral     SpO2 04/29/23 0122 97 %     Weight 04/29/23 0128 170 lb (77.1 kg)     Height 04/29/23 0128 5\' 10"  (1.778 m)     Head Circumference --      Peak Flow --      Pain Score 04/29/23 0131 0     Pain Loc --      Pain Education --      Exclude from Growth Chart --     Most recent vital signs: Vitals:   04/29/23 0130 04/29/23 0131  BP: (!) 139/101 (!) 138/100  Pulse: (!) 110 (!) 104  Resp: 18 20  Temp:  98.7 F (37.1 C)  SpO2: 95% 97%    General: Awake, no distress.  CV:  Good peripheral perfusion.  Resp:  Normal effort.  Abd:  No distention.  Other:  Awake alert  cooperative slightly tachycardic, with scant wheezing throughout all lung fields without focality.  He is appears slightly hyperactive consistent with his reported crack cocaine use.  He has a soft benign abdominal exam.   ED Results / Procedures / Treatments   Labs (all labs ordered are listed, but only abnormal results are displayed) Labs Reviewed  CBC WITH DIFFERENTIAL/PLATELET - Abnormal; Notable for the following components:      Result Value   Monocytes Absolute 1.1 (*)    All other components within normal limits  COMPREHENSIVE METABOLIC PANEL WITH GFR  BRAIN NATRIURETIC PEPTIDE  TROPONIN I (HIGH SENSITIVITY)     I ordered and reviewed the above labs they are notable for cell counts unremarkable, normal white blood cell count.  EKG  ED ECG REPORT I, Buell Carmin, the attending physician, personally viewed and interpreted this ECG.   Date: 04/29/2023  EKG Time: 0123  Rate: 106  Rhythm: sinus tachycardia  Axis: nl  Intervals:nl  ST&T Change: no stemi    RADIOLOGY I independently reviewed and interpreted chest x-ray  I see no obvious focality pneumothorax I also reviewed radiologist's formal read.   PROCEDURES:  Critical Care performed: No  Procedures   MEDICATIONS ORDERED IN ED: Medications  diazepam  (VALIUM ) injection 5 mg (5 mg Intravenous Given 04/29/23 0157)  ipratropium-albuterol  (DUONEB) 0.5-2.5 (3) MG/3ML nebulizer solution 6 mL (6 mLs Nebulization Given 04/29/23 0158)     IMPRESSION / MDM / ASSESSMENT AND PLAN / ED COURSE  I reviewed the triage vital signs and the nursing notes.                                Patient's presentation is most consistent with acute presentation with potential threat to life or bodily function.  Differential diagnosis includes, but is not limited to, COPD or asthma exacerbation, ARDS, respiratory infection, ACS, STEMI intoxication   The patient is on the cardiac monitor to evaluate for evidence of arrhythmia and/or  significant heart rate changes.  MDM:    I think he had a COPD exacerbation due to his lack of medications at home, and exacerbated by his crack cocaine use tonight.  He appears hyperactive slightly consistent with his crack cocaine use.  He denies chest pain he has a nonischemic EKG I doubt ACS.  I will give him another DuoNeb given his scant wheezing.  I will give him a prednisone  burst and a refill of his nebulizer and inhaler sent to his pharmacy.  I will treat him with some IV Valium  for his hyperactivity due to his crack cocaine use while other lab testing and treatment ongoing.    FINAL CLINICAL IMPRESSION(S) / ED DIAGNOSES   Final diagnoses:  COPD exacerbation (HCC)  Crack cocaine use     Rx / DC Orders   ED Discharge Orders          Ordered    albuterol  (VENTOLIN  HFA) 108 (90 Base) MCG/ACT inhaler  Every 6 hours PRN        04/29/23 0205    Spacer/Aero-Holding Chambers (AEROCHAMBER MV) inhaler        04/29/23 0205    albuterol  (PROVENTIL ) (2.5 MG/3ML) 0.083% nebulizer solution  Every 4 hours PRN        04/29/23 0205    predniSONE  (DELTASONE ) 50 MG tablet        04/29/23 0205    Ambulatory Referral to Primary Care (Establish Care)        04/29/23 0206             Note:  This document was prepared using Dragon voice recognition software and may include unintentional dictation errors.    Buell Carmin, MD 04/29/23 [phone]

## 2023-05-04 ENCOUNTER — Emergency Department (HOSPITAL_COMMUNITY): Payer: MEDICAID

## 2023-05-04 ENCOUNTER — Encounter (HOSPITAL_COMMUNITY): Payer: Self-pay | Admitting: Emergency Medicine

## 2023-05-04 ENCOUNTER — Observation Stay (HOSPITAL_COMMUNITY)
Admission: EM | Admit: 2023-05-04 | Discharge: 2023-05-07 | Disposition: A | Discharge status: Home / Self Care | Payer: MEDICAID | Attending: Emergency Medicine | Admitting: Emergency Medicine

## 2023-05-04 ENCOUNTER — Other Ambulatory Visit: Payer: Self-pay

## 2023-05-04 DIAGNOSIS — F17210 Nicotine dependence, cigarettes, uncomplicated: Secondary | ICD-10-CM | POA: Insufficient documentation

## 2023-05-04 DIAGNOSIS — F1420 Cocaine dependence, uncomplicated: Secondary | ICD-10-CM | POA: Diagnosis not present

## 2023-05-04 DIAGNOSIS — R0602 Shortness of breath: Secondary | ICD-10-CM | POA: Diagnosis present

## 2023-05-04 DIAGNOSIS — R7303 Prediabetes: Secondary | ICD-10-CM | POA: Diagnosis present

## 2023-05-04 DIAGNOSIS — A64 Unspecified sexually transmitted disease: Secondary | ICD-10-CM | POA: Diagnosis not present

## 2023-05-04 DIAGNOSIS — F332 Major depressive disorder, recurrent severe without psychotic features: Secondary | ICD-10-CM | POA: Diagnosis not present

## 2023-05-04 DIAGNOSIS — Z79899 Other long term (current) drug therapy: Secondary | ICD-10-CM | POA: Diagnosis not present

## 2023-05-04 DIAGNOSIS — Z1152 Encounter for screening for COVID-19: Secondary | ICD-10-CM | POA: Diagnosis not present

## 2023-05-04 DIAGNOSIS — J441 Chronic obstructive pulmonary disease with (acute) exacerbation: Principal | ICD-10-CM | POA: Diagnosis present

## 2023-05-04 LAB — CBC WITH DIFFERENTIAL/PLATELET
Abs Immature Granulocytes: 0.03 10*3/uL (ref 0.00–0.07)
Basophils Absolute: 0.1 10*3/uL (ref 0.0–0.1)
Basophils Relative: 1 %
Eosinophils Absolute: 0.7 10*3/uL — ABNORMAL HIGH (ref 0.0–0.5)
Eosinophils Relative: 7 %
HCT: 46.2 % (ref 39.0–52.0)
Hemoglobin: 14.9 g/dL (ref 13.0–17.0)
Immature Granulocytes: 0 %
Lymphocytes Relative: 29 %
Lymphs Abs: 2.9 10*3/uL (ref 0.7–4.0)
MCH: 28.9 pg (ref 26.0–34.0)
MCHC: 32.3 g/dL (ref 30.0–36.0)
MCV: 89.7 fL (ref 80.0–100.0)
Monocytes Absolute: 0.5 10*3/uL (ref 0.1–1.0)
Monocytes Relative: 5 %
Neutro Abs: 5.8 10*3/uL (ref 1.7–7.7)
Neutrophils Relative %: 58 %
Platelets: 342 10*3/uL (ref 150–400)
RBC: 5.15 MIL/uL (ref 4.22–5.81)
RDW: 14.8 % (ref 11.5–15.5)
WBC: 10 10*3/uL (ref 4.0–10.5)
nRBC: 0 % (ref 0.0–0.2)

## 2023-05-04 LAB — BLOOD GAS, VENOUS
Acid-Base Excess: 2.8 mmol/L — ABNORMAL HIGH (ref 0.0–2.0)
Bicarbonate: 30.7 mmol/L — ABNORMAL HIGH (ref 20.0–28.0)
O2 Saturation: 72.7 %
Patient temperature: 36.5
pCO2, Ven: 60 mmHg (ref 44–60)
pH, Ven: 7.32 (ref 7.25–7.43)
pO2, Ven: 42 mmHg (ref 32–45)

## 2023-05-04 LAB — RESP PANEL BY RT-PCR (RSV, FLU A&B, COVID)  RVPGX2
Influenza A by PCR: NEGATIVE
Influenza B by PCR: NEGATIVE
Resp Syncytial Virus by PCR: NEGATIVE
SARS Coronavirus 2 by RT PCR: NEGATIVE

## 2023-05-04 LAB — BASIC METABOLIC PANEL WITH GFR
Anion gap: 8 (ref 5–15)
BUN: 10 mg/dL (ref 6–20)
CO2: 25 mmol/L (ref 22–32)
Calcium: 9 mg/dL (ref 8.9–10.3)
Chloride: 106 mmol/L (ref 98–111)
Creatinine, Ser: 0.9 mg/dL (ref 0.61–1.24)
GFR, Estimated: >60 mL/min (ref >60)
Glucose, Bld: 138 mg/dL — ABNORMAL HIGH (ref 70–99)
Potassium: 3.8 mmol/L (ref 3.5–5.1)
Sodium: 139 mmol/L (ref 135–145)

## 2023-05-04 MED ORDER — ACETAMINOPHEN 650 MG RE SUPP: 650.0000 mg | Freq: Four times a day (QID) | RECTAL | Status: DC | PRN

## 2023-05-04 MED ORDER — ALBUTEROL SULFATE (2.5 MG/3ML) 0.083% IN NEBU
10.0000 mg | INHALATION_SOLUTION | Freq: Once | RESPIRATORY_TRACT | Status: AC
  Administered 2023-05-04: 10 mg via RESPIRATORY_TRACT
  Filled 2023-05-04: qty 12

## 2023-05-04 MED ORDER — LORAZEPAM 2 MG/ML PO CONC: 1.0000 mg | Freq: Once | ORAL | Status: DC

## 2023-05-04 MED ORDER — SODIUM CHLORIDE 0.9 % IV SOLN
500.0000 mg | Freq: Every day | INTRAVENOUS | Status: DC
  Administered 2023-05-05: 500 mg via INTRAVENOUS
  Filled 2023-05-04: qty 5

## 2023-05-04 MED ORDER — LORAZEPAM 2 MG/ML IJ SOLN
1.0000 mg | Freq: Once | INTRAMUSCULAR | Status: AC
  Administered 2023-05-04: 1 mg via INTRAVENOUS
  Filled 2023-05-04: qty 1

## 2023-05-04 MED ORDER — ONDANSETRON HCL 4 MG PO TABS: 4.0000 mg | ORAL_TABLET | Freq: Four times a day (QID) | ORAL | Status: DC | PRN

## 2023-05-04 MED ORDER — MELATONIN 5 MG PO TABS
5.0000 mg | ORAL_TABLET | Freq: Every day | ORAL | Status: DC
  Administered 2023-05-05 – 2023-05-06 (×3): 5 mg via ORAL
  Filled 2023-05-04 (×3): qty 1

## 2023-05-04 MED ORDER — IPRATROPIUM-ALBUTEROL 0.5-2.5 (3) MG/3ML IN SOLN
3.0000 mL | Freq: Four times a day (QID) | RESPIRATORY_TRACT | Status: DC
  Administered 2023-05-05 – 2023-05-07 (×9): 3 mL via RESPIRATORY_TRACT
  Filled 2023-05-04 (×10): qty 3

## 2023-05-04 MED ORDER — SENNOSIDES-DOCUSATE SODIUM 8.6-50 MG PO TABS: 1.0000 | ORAL_TABLET | Freq: Every evening | ORAL | Status: DC | PRN

## 2023-05-04 MED ORDER — FLUOXETINE HCL 20 MG PO CAPS
30.0000 mg | ORAL_CAPSULE | Freq: Every day | ORAL | Status: DC
  Administered 2023-05-05 – 2023-05-07 (×3): 30 mg via ORAL
  Filled 2023-05-04 (×3): qty 1

## 2023-05-04 MED ORDER — PREDNISONE 20 MG PO TABS
40.0000 mg | ORAL_TABLET | Freq: Every day | ORAL | Status: DC
  Administered 2023-05-05: 40 mg via ORAL
  Filled 2023-05-04: qty 2

## 2023-05-04 MED ORDER — ACETAMINOPHEN 325 MG PO TABS
650.0000 mg | ORAL_TABLET | Freq: Four times a day (QID) | ORAL | Status: DC | PRN
  Administered 2023-05-05 – 2023-05-06 (×2): 650 mg via ORAL
  Filled 2023-05-04 (×2): qty 2

## 2023-05-04 MED ORDER — ENOXAPARIN SODIUM 40 MG/0.4ML IJ SOSY
40.0000 mg | PREFILLED_SYRINGE | Freq: Every day | INTRAMUSCULAR | Status: DC
  Administered 2023-05-05 – 2023-05-07 (×3): 40 mg via SUBCUTANEOUS
  Filled 2023-05-04 (×3): qty 0.4

## 2023-05-04 MED ORDER — FLUTICASONE FUROATE-VILANTEROL 100-25 MCG/ACT IN AEPB
1.0000 | INHALATION_SPRAY | Freq: Every day | RESPIRATORY_TRACT | Status: DC
  Administered 2023-05-05: 1 via RESPIRATORY_TRACT
  Filled 2023-05-04 (×2): qty 28

## 2023-05-04 MED ORDER — ONDANSETRON HCL 4 MG/2ML IJ SOLN: 4.0000 mg | Freq: Four times a day (QID) | INTRAMUSCULAR | Status: DC | PRN

## 2023-05-04 NOTE — ED Notes (Signed)
 Patient now tolerating BiPap.

## 2023-05-04 NOTE — ED Notes (Signed)
 Patient continues to remove bipap despite multiple attempts at redirection and education on leaving it on.  MD Countryman made aware.

## 2023-05-04 NOTE — ED Triage Notes (Signed)
 Patient BIB [facility] c/o sob x 2-3 days.  EMS reports global wheezing, patient in tripod position on cpap upon arrival.  1 duoneb .3mg  IM epi 2 G mag 125 mg solu-medrol   20 L AC

## 2023-05-04 NOTE — ED Provider Notes (Signed)
 [place] [facility] Provider Note   CSN: [id number] Arrival date & time: 05/04/23  2019     History Chief Complaint  Patient presents with   Shortness of Breath    HPI Franklin [name] is a 53 y.o. male presenting for SOB x 3 days.   Patient's recorded medical, surgical, social, medication list and allergies were reviewed in the Snapshot window as part of the initial history.   Review of Systems   Review of Systems  Constitutional:  Negative for chills and fever.  HENT:  Negative for ear pain and sore throat.   Eyes:  Negative for pain and visual disturbance.  Respiratory:  Positive for cough, shortness of breath and wheezing.   Cardiovascular:  Negative for chest pain and palpitations.  Gastrointestinal:  Negative for abdominal pain and vomiting.  Genitourinary:  Negative for dysuria and hematuria.  Musculoskeletal:  Negative for arthralgias and back pain.  Skin:  Negative for color change and rash.  Neurological:  Negative for seizures and syncope.  All other systems reviewed and are negative.   Physical Exam Updated Vital Signs BP 135/87   Pulse (!) 105   Temp 97.7 F (36.5 C) (Temporal)   Resp (!) 22   Wt 77 kg   SpO2 97%   BMI 24.36 kg/m  Physical Exam Vitals and nursing note reviewed.  Constitutional:      General: He is not in acute distress.    Appearance: He is well-developed.  HENT:     Head: Normocephalic and atraumatic.  Eyes:     Conjunctiva/sclera: Conjunctivae normal.  Cardiovascular:     Rate and Rhythm: Normal rate and regular rhythm.     Heart sounds: No murmur heard. Pulmonary:     Effort: Pulmonary effort is normal. No respiratory distress.     Breath sounds: Normal breath sounds.  Abdominal:     Palpations: Abdomen is soft.     Tenderness: There is no abdominal tenderness.  Musculoskeletal:        General: No swelling.     Cervical back: Neck supple.  Skin:    General: Skin is warm and dry.      Capillary Refill: Capillary refill takes less than 2 seconds.  Neurological:     Mental Status: He is alert.  Psychiatric:        Mood and Affect: Mood normal.      ED Course/ Medical Decision Making/ A&P    Procedures .Critical Care  Performed by: Onetha Bile, MD Authorized by: Onetha Bile, MD   Critical care provider statement:    Critical care time (minutes):  80   Critical care was necessary to treat or prevent imminent or life-threatening deterioration of the following conditions:  Respiratory failure   Critical care was time spent personally by me on the following activities:  Development of treatment plan with patient or surrogate, discussions with consultants, evaluation of patient's response to treatment, examination of patient, ordering and review of laboratory studies, ordering and review of radiographic studies, ordering and performing treatments and interventions, pulse oximetry, re-evaluation of patient's condition and review of old charts   Care discussed with: admitting provider      Medications Ordered in ED Medications  enoxaparin  (LOVENOX ) injection 40 mg (has no administration in time range)  predniSONE  (DELTASONE ) tablet 40 mg (has no administration in time range)  fluticasone  furoate-vilanterol (BREO ELLIPTA ) 100-25 MCG/ACT 1 puff (has no administration in time range)  ipratropium-albuterol  (DUONEB) 0.5-2.5 (3)  MG/3ML nebulizer solution 3 mL (has no administration in time range)  acetaminophen  (TYLENOL ) tablet 650 mg (has no administration in time range)    Or  acetaminophen  (TYLENOL ) suppository 650 mg (has no administration in time range)  senna-docusate (Senokot-S) tablet 1 tablet (has no administration in time range)  ondansetron  (ZOFRAN ) tablet 4 mg (has no administration in time range)    Or  ondansetron  (ZOFRAN ) injection 4 mg (has no administration in time range)  azithromycin  (ZITHROMAX ) 500 mg in sodium chloride  0.9 % 250 mL IVPB (has  no administration in time range)  LORazepam  (ATIVAN ) injection 1 mg (1 mg Intravenous Given 05/04/23 2025)  albuterol  (PROVENTIL ) (2.5 MG/3ML) 0.083% nebulizer solution 10 mg (10 mg Nebulization Given 05/04/23 2026)  LORazepam  (ATIVAN ) injection 1 mg (1 mg Intravenous Given 05/04/23 2054)   Medical Decision Making:   Deric Vietmeier is a 53 y.o. male with a history of COPD, who presented to the ED today with acute on chronic SOB. They are endorsing worsening of their baseline dyspnea over the past 72 hours. Their baseline is a 0L O2 requirement. At their baseline they are able to get around the neighborhood and they are not able to at this time.   On my initial exam, the pt was SOB and tachypneic. Audible wheezing and grossly decreased breath sounds appreciated.  They are endorsing increased sputum production.    Reviewed and confirmed nursing documentation for past medical history, family history, social history.    Initial Assessment:   With the patient's presentation of SOB in the above setting, most likely diagnosis is COPD Exacerbation. Other diagnoses were considered including (but not limited to) CAP, PE, ACS, viral infection, PTX. These are considered less likely due to history of present illness and physical exam findings.   This is most consistent with an acute life/limb threatening illness complicated by underlying chronic conditions.  Initial Plan:  Empiric treatment of patient's symptoms with immediate initiation of inhaled bronchodilators and IV steroids. Given advanced nature of patient's presentation, patient recieved IV magnesium  and EPI as a rescue therapy.   Given extremis nature of the presentation, patient required non invasive ventilation initiation.  Evaluation for ACS with EKG and delta troponin  Evaluation for infectious versus intrathoracic abnormality with chest x-ray  Evaluation for volume overload with BNP  Screening labs including CBC and Metabolic panel to evaluate for  infectious or metabolic etiology of disease.  Patient's Wells score is LOW and patient does not warrant further objective evaluation for PE based on consistency of presentation of alternative diagnosis.  Objective evaluation as below reviewed   Initial Study Results:   Laboratory  All laboratory results reviewed without evidence of clinically relevant pathology.    EKG EKG was reviewed independently. Rate, rhythm, axis, intervals all examined and without medically relevant abnormality. ST segments without concerns for elevations.    Radiology:  All images reviewed independently. Agree with radiology report at this time.   DG Chest Portable 1 View Result Date: 05/04/2023 CLINICAL DATA:  Cough, short of breath for 2-3 days EXAM: PORTABLE CHEST 1 VIEW COMPARISON:  04/29/2023 FINDINGS: 2 frontal views of the chest demonstrate an unremarkable cardiac silhouette. No acute airspace disease, effusion, or pneumothorax. No acute bony abnormalities. IMPRESSION: 1. No acute intrathoracic process. Electronically Signed   By: Bobbye Burrow M.D.   On: 05/04/2023 20:43   DG Chest Port 1 View Result Date: 04/29/2023 CLINICAL DATA:  Shortness of breath. EXAM: PORTABLE CHEST 1 VIEW COMPARISON:  February 27, 2023 FINDINGS: The heart size and mediastinal contours are within normal limits. Both lungs are clear. The visualized skeletal structures are unremarkable. IMPRESSION: No active disease. Electronically Signed   By: Virgle Grime M.D.   On: 04/29/2023 02:29      Consults: Case discussed with hospitalist.   Final Assessment and Plan:   After initiation of medical therapies, patient is grossly improved and no longer in acute distress.    Given the advanced nature of the patient's presentation, patient will require advanced care and admission was arranged.       Clinical Impression:  1. COPD exacerbation (HCC)      Admit   Clinical Impression:  1. COPD exacerbation (HCC)      Admit   Final  Clinical Impression(s) / ED Diagnoses Final diagnoses:  COPD exacerbation (HCC)    Rx / DC Orders ED Discharge Orders     None         Onetha Bile, MD 05/04/23 2300

## 2023-05-04 NOTE — ED Notes (Signed)
 ED TO INPATIENT HANDOFF REPORT  ED Nurse Name and Phone #: Kayleen Party [phone]  S Name/Age/Gender Franklin [name] 53 y.o. male Room/Bed: TRACC/TRACC  Code Status   Code Status: Full Code  Home/SNF/Other Home Patient oriented to: self, place, time, and situation Is this baseline? Yes   Triage Complete: Triage complete  Chief Complaint COPD exacerbation (HCC) [J44.1]  Triage Note Patient BIB [facility] c/o sob x 2-3 days.  EMS reports global wheezing, patient in tripod position on cpap upon arrival.  1 duoneb .3mg  IM epi 2 G mag 125 mg solu-medrol   20 L AC   Allergies No Known Allergies  Level of Care/Admitting Diagnosis ED Disposition     ED Disposition  Admit   Condition  --   Comment  Hospital Area: [facility] [[id number]]  Level of Care: Progressive [102]  Admit to Progressive based on following criteria: RESPIRATORY PROBLEMS hypoxemic/hypercapnic respiratory failure that is responsive to NIPPV (BiPAP) or High Flow Nasal Cannula (6-80 lpm). Frequent assessment/intervention, no > Q2 hrs < Q4 hrs, to maintain oxygenation and pulmonary hygiene.  May place patient in observation at [facility] or [facility] if equivalent level of care is available:: No  Covid Evaluation: Asymptomatic - no recent exposure (last 10 days) testing not required  Diagnosis: COPD exacerbation [facility]) [[id number]]  Admitting Physician: Vita Grip [[id number]]  Attending Physician: Vita Grip [[id number]]          B Medical/Surgery History Past Medical History:  Diagnosis Date   Anxiety    COPD (chronic obstructive pulmonary disease) (HCC)    Depression    Substance abuse (HCC)    Past Surgical History:  Procedure Laterality Date   NO PAST SURGERIES       A IV Location/Drains/Wounds Patient Lines/Drains/Airways Status     Active Line/Drains/Airways     Name Placement date Placement time Site Days   Peripheral IV 05/04/23 20 G Left Antecubital 05/04/23  2022   Antecubital  less than 1            Intake/Output Last 24 hours No intake or output data in the 24 hours ending 05/04/23 2305  Labs/Imaging Results for orders placed or performed during the hospital encounter of 05/04/23 (from the past 48 hours)  CBC with Differential     Status: Abnormal   Collection Time: 05/04/23  8:22 PM  Result Value Ref Range   WBC 10.0 4.0 - 10.5 K/uL   RBC 5.15 4.22 - 5.81 MIL/uL   Hemoglobin 14.9 13.0 - 17.0 g/dL   HCT 31.5 40.0 - 86.7 %   MCV 89.7 80.0 - 100.0 fL   MCH 28.9 26.0 - 34.0 pg   MCHC 32.3 30.0 - 36.0 g/dL   RDW 61.9 50.9 - 32.6 %   Platelets 342 150 - 400 K/uL   nRBC 0.0 0.0 - 0.2 %   Neutrophils Relative % 58 %   Neutro Abs 5.8 1.7 - 7.7 K/uL   Lymphocytes Relative 29 %   Lymphs Abs 2.9 0.7 - 4.0 K/uL   Monocytes Relative 5 %   Monocytes Absolute 0.5 0.1 - 1.0 K/uL   Eosinophils Relative 7 %   Eosinophils Absolute 0.7 (H) 0.0 - 0.5 K/uL   Basophils Relative 1 %   Basophils Absolute 0.1 0.0 - 0.1 K/uL   Immature Granulocytes 0 %   Abs Immature Granulocytes 0.03 0.00 - 0.07 K/uL    Comment: Performed at [facility], [address], [place], [place]  [address]  Basic metabolic panel     Status: Abnormal   Collection Time: 05/04/23  8:22 PM  Result Value Ref Range   Sodium 139 135 - 145 mmol/L   Potassium 3.8 3.5 - 5.1 mmol/L   Chloride 106 98 - 111 mmol/L   CO2 25 22 - 32 mmol/L   Glucose, Bld 138 (H) 70 - 99 mg/dL    Comment: Glucose reference range applies only to samples taken after fasting for at least 8 hours.   BUN 10 6 - 20 mg/dL   Creatinine, Ser 6.04 0.61 - 1.24 mg/dL   Calcium 9.0 8.9 - 54.0 mg/dL   GFR, Estimated >98 >11 mL/min    Comment: (NOTE) Calculated using the CKD-EPI Creatinine Equation (2021)    Anion gap 8 5 - 15    Comment: Performed at [facility], [address] [place], [place] [address]  Blood gas, venous     Status: Abnormal   Collection Time: 05/04/23  8:29 PM  Result Value Ref  Range   pH, Ven 7.32 7.25 - 7.43   pCO2, Ven 60 44 - 60 mmHg   pO2, Ven 42 32 - 45 mmHg   Bicarbonate 30.7 (H) 20.0 - 28.0 mmol/L   Acid-Base Excess 2.8 (H) 0.0 - 2.0 mmol/L   O2 Saturation 72.7 %   Patient temperature 36.5     Comment: Performed at [facility], [address], [place], [place] [address]   DG Chest Portable 1 View Result Date: 05/04/2023 CLINICAL DATA:  Cough, short of breath for 2-3 days EXAM: PORTABLE CHEST 1 VIEW COMPARISON:  04/29/2023 FINDINGS: 2 frontal views of the chest demonstrate an unremarkable cardiac silhouette. No acute airspace disease, effusion, or pneumothorax. No acute bony abnormalities. IMPRESSION: 1. No acute intrathoracic process. Electronically Signed   By: Bobbye Burrow M.D.   On: 05/04/2023 20:43    Pending Labs Unresulted Labs (From admission, onward)     Start     Ordered   05/04/23 2022  Resp panel by RT-PCR (RSV, Flu A&B, Covid) Anterior Nasal Swab  Once,   URGENT        05/04/23 2021            Vitals/Pain Today's Vitals   05/04/23 2215 05/04/23 2230 05/04/23 2245 05/04/23 2300  BP: 135/87 (!) 152/96 131/79 (!) 147/83  Pulse: (!) 105 98 (!) 101 98  Resp: (!) 22 20 (!) 21 (!) 22  Temp:      TempSrc:      SpO2: 97% 100% 99% 99%  Weight:      PainSc:        Isolation Precautions No active isolations  Medications Medications  enoxaparin  (LOVENOX ) injection 40 mg (has no administration in time range)  predniSONE  (DELTASONE ) tablet 40 mg (has no administration in time range)  fluticasone  furoate-vilanterol (BREO ELLIPTA ) 100-25 MCG/ACT 1 puff (has no administration in time range)  ipratropium-albuterol  (DUONEB) 0.5-2.5 (3) MG/3ML nebulizer solution 3 mL (has no administration in time range)  acetaminophen  (TYLENOL ) tablet 650 mg (has no administration in time range)    Or  acetaminophen  (TYLENOL ) suppository 650 mg (has no administration in time range)  senna-docusate (Senokot-S) tablet 1 tablet (has no  administration in time range)  ondansetron  (ZOFRAN ) tablet 4 mg (has no administration in time range)    Or  ondansetron  (ZOFRAN ) injection 4 mg (has no administration in time range)  azithromycin  (ZITHROMAX ) 500 mg in sodium chloride  0.9 % 250 mL  IVPB (has no administration in time range)  melatonin tablet 5 mg (has no administration in time range)  FLUoxetine  (PROZAC ) capsule 30 mg (has no administration in time range)  LORazepam  (ATIVAN ) injection 1 mg (1 mg Intravenous Given 05/04/23 2025)  albuterol  (PROVENTIL ) (2.5 MG/3ML) 0.083% nebulizer solution 10 mg (10 mg Nebulization Given 05/04/23 2026)  LORazepam  (ATIVAN ) injection 1 mg (1 mg Intravenous Given 05/04/23 2054)    Mobility walks       R Recommendations: See Admitting Provider Note  Report given to:   Additional Notes:

## 2023-05-04 NOTE — H&P (Signed)
 History and Physical  Faolan Honkomp EAV:[id number] DOB: 1970/11/18 DOA: 05/04/2023  PCP: Patient, No Pcp Per   Chief Complaint: Shortness of breath  HPI: Franklin [name] is a 53 y.o. male with medical history significant for anxiety and depression, COPD, polysubstance use disorder (THC and cocaine) and tobacco use disorder who presents to the ED for evaluation of shortness of breath. Patient reports he has had shortness of breath with associated wheezing for the last 3 days.  Today, his symptoms worsened so he called EMS. He reports smoking about 2 packs/day of cigarettes as well as smoking marijuana and cocaine.  Reports he last use cocaine earlier today. On EMS arrival, patient was found to be in tripod position on his CPAP.  Patient received IV Solu-Medrol  125 mg, IV mag 2 g, 0.3 mg IM epinephrine  and 1 DuoNeb.  Patient on BiPAP during evaluation.  He reports improvement in shortness of breath but denies any chest pain, nausea or vomiting.  ED Course: Initial vitals showed temp 97.7, RR 28, HR 121, BP 146/87, SPO2 99% on BiPAP at 40% FiO2.  Labs significant for normal white count, normal kidney function, normal VBG, negative COVID, flu and RSV test. EKG shows sinus tach with multiple PVCs.  CXR with no acute cardiopulmonary disease.  Patient received IV Ativan  1 mg x 2 due to increased anxiety on the BiPAP. He also received albuterol  nebs 10 mg x 1. TRH was consulted for admission.  Review of Systems: Please see HPI for pertinent positives and negatives. A complete 10 system review of systems are otherwise negative.  Past Medical History:  Diagnosis Date   Anxiety    COPD (chronic obstructive pulmonary disease) (HCC)    Depression    Substance abuse (HCC)    Past Surgical History:  Procedure Laterality Date   NO PAST SURGERIES     Social History:  reports that he has been smoking cigarettes. He has never used smokeless tobacco. He reports current alcohol  use of about 12.0 standard drinks  of alcohol  per week. He reports current drug use. Drugs: "Crack" cocaine and Cocaine.  No Known Allergies  Family History  Problem Relation Age of Onset   Hypertension Other    Diabetes Other      Prior to Admission medications   Medication Sig Start Date End Date Taking? Authorizing Provider  albuterol  (PROVENTIL ) (2.5 MG/3ML) 0.083% nebulizer solution Take 3 mLs (2.5 mg total) by nebulization every 4 (four) hours as needed for wheezing or shortness of breath. 04/29/23 04/28/24  Buell Carmin, MD  albuterol  (VENTOLIN  HFA) 108 [phone] Base) MCG/ACT inhaler Inhale 2 puffs into the lungs every 6 (six) hours as needed for wheezing or shortness of breath. 04/29/23   Buell Carmin, MD  FLUoxetine  (PROZAC ) 10 MG capsule Take 3 capsules (30 mg total) by mouth daily. 03/16/23   Jadapalle, Sree, MD  melatonin 5 MG TABS Take 1 tablet (5 mg total) by mouth at bedtime. 03/15/23   Aurelia Blotter, MD  predniSONE  (DELTASONE ) 50 MG tablet Take 1 tablet daily for the next 4 days 04/29/23   Buell Carmin, MD  Spacer/Aero-Holding Chambers (AEROCHAMBER MV) inhaler Use as instructed 04/29/23   Buell Carmin, MD  Vitamin D , Ergocalciferol , (DRISDOL ) 1.25 MG (50000 UNIT) CAPS capsule Take 1 capsule (50,000 Units total) by mouth once a week. 03/18/23   Aurelia Blotter, MD    Physical Exam: BP 135/87   Pulse (!) 105   Temp 97.7 F (36.5 C) (Temporal)   Resp (!) 22  Wt 77 kg   SpO2 97%   BMI 24.36 kg/m  General: Ill-appearing middle-age man laying in bed slightly restless. HEENT: [place]/AT. Anicteric sclera. Dry mucous membrane. CV: Tachycardic. Regular rhythm. No murmurs, rubs, or gallops. No LE edema Pulmonary: On BiPAP. Lungs CTAB. Moderate expiratory wheezing throughout. No rales or rhonchi. Abdominal: Soft, nontender, nondistended. Normal bowel sounds. Extremities: Palpable radial and DP pulses. Normal ROM. Skin: Warm and dry. No obvious rash or lesions. Neuro: A&Ox3. Moves all extremities. Normal sensation to light  touch. No focal deficit. Psych: Anxious          Labs on Admission:  Basic Metabolic Panel: Recent Labs  Lab 04/29/23 0156 05/04/23 2022  NA 138 139  K 3.8 3.8  CL 104 106  CO2 22 25  GLUCOSE 101* 138*  BUN 20 10  CREATININE 1.11 0.90  CALCIUM 9.5 9.0   Liver Function Tests: Recent Labs  Lab 04/29/23 0156  AST 25  ALT 19  ALKPHOS 64  BILITOT 2.0*  PROT 8.1  ALBUMIN 4.4   No results for input(s): "LIPASE", "AMYLASE" in the last 168 hours. No results for input(s): "AMMONIA" in the last 168 hours. CBC: Recent Labs  Lab 04/29/23 0156 05/04/23 2022  WBC 9.3 10.0  NEUTROABS 5.8 5.8  HGB 14.3 14.9  HCT 42.7 46.2  MCV 86.6 89.7  PLT 278 342   Cardiac Enzymes: No results for input(s): "CKTOTAL", "CKMB", "CKMBINDEX", "TROPONINI" in the last 168 hours. BNP (last 3 results) Recent Labs    06/18/22 2226 06/30/22 2311 04/29/23 0156  BNP 23.4 47.7 21.2    ProBNP (last 3 results) No results for input(s): "PROBNP" in the last 8760 hours.  CBG: No results for input(s): "GLUCAP" in the last 168 hours.  Radiological Exams on Admission: DG Chest Portable 1 View Result Date: 05/04/2023 CLINICAL DATA:  Cough, short of breath for 2-3 days EXAM: PORTABLE CHEST 1 VIEW COMPARISON:  04/29/2023 FINDINGS: 2 frontal views of the chest demonstrate an unremarkable cardiac silhouette. No acute airspace disease, effusion, or pneumothorax. No acute bony abnormalities. IMPRESSION: 1. No acute intrathoracic process. Electronically Signed   By: Bobbye Burrow M.D.   On: 05/04/2023 20:43   Assessment/Plan Franklin [name] is a 53 y.o. male with medical history significant for anxiety and depression, COPD, polysubstance use disorder (THC and cocaine) and tobacco use disorder who presents to the ED for evaluation of shortness of breath and admitted for COPD exacerbation  # COPD with acute exacerbation - History of COPD with continuous use of tobacco and polysubstance use including cocaine  and THC. - Found to have significant wheezing, SOB and anxiety on admission requiring BiPAP therapy - No evidence of active respiratory infection, COPD exacerbation due to persistent tobacco use and cocaine use - Prednisone  40 mg daily for 4 days - Breo Ellipta  and scheduled DuoNebs - IV azithromycin  500 mg daily - Incentive spirometer, flutter valve - Continue as needed BiPAP therapy  # Tobacco use disorder - Reports smoking about 2 packs/day of cigarettes - Provided brief tobacco cessation counseling at bedside - Agreeable to nicotine  patch, ordered  # Polysubstance use disorder - History of smoking crack cocaine and marijuana - Admits to crack cocaine use prior to arrival - [facility] consulted for substance use disorder resources  # Anxiety and depression - Continue fluoxetine  - PRN Atarax  as needed for anxiety  # Insomnia - Continue melatonin at bedtime   DVT prophylaxis: Lovenox      Code Status: Full Code  Consults  called: None  Family Communication: No family at bedside  Severity of Illness: The appropriate patient status for this patient is OBSERVATION. Observation status is judged to be reasonable and necessary in order to provide the required intensity of service to ensure the patient's safety. The patient's presenting symptoms, physical exam findings, and initial radiographic and laboratory data in the context of their medical condition is felt to place them at decreased risk for further clinical deterioration. Furthermore, it is anticipated that the patient will be medically stable for discharge from the hospital within 2 midnights of admission.   Level of care: Progressive   This record has been created using Conservation officer, historic buildings. Errors have been sought and corrected, but may not always be located. Such creation errors do not reflect on the standard of care.   Vita Grip, MD 05/04/2023, 11:02 PM [facility] Pager: [phone] Isaiah  41:10   If 7PM-7AM, please contact night-coverage [email or web address] Password [email or web address]

## 2023-05-04 NOTE — Progress Notes (Signed)
Pt placed on Bipap per MD verbal order.

## 2023-05-05 DIAGNOSIS — A64 Unspecified sexually transmitted disease: Secondary | ICD-10-CM

## 2023-05-05 DIAGNOSIS — F1420 Cocaine dependence, uncomplicated: Secondary | ICD-10-CM | POA: Diagnosis not present

## 2023-05-05 DIAGNOSIS — R7303 Prediabetes: Secondary | ICD-10-CM

## 2023-05-05 DIAGNOSIS — F332 Major depressive disorder, recurrent severe without psychotic features: Secondary | ICD-10-CM | POA: Diagnosis not present

## 2023-05-05 DIAGNOSIS — J441 Chronic obstructive pulmonary disease with (acute) exacerbation: Secondary | ICD-10-CM | POA: Diagnosis not present

## 2023-05-05 LAB — HIV ANTIBODY (ROUTINE TESTING W REFLEX): HIV Screen 4th Generation wRfx: NONREACTIVE

## 2023-05-05 MED ORDER — NICOTINE 21 MG/24HR TD PT24
21.0000 mg | MEDICATED_PATCH | Freq: Every day | TRANSDERMAL | Status: DC
  Administered 2023-05-05: 21 mg via TRANSDERMAL
  Filled 2023-05-05 (×2): qty 1

## 2023-05-05 MED ORDER — LORAZEPAM 1 MG PO TABS
1.0000 mg | ORAL_TABLET | ORAL | Status: DC | PRN
  Administered 2023-05-05 – 2023-05-07 (×5): 1 mg via ORAL
  Filled 2023-05-05 (×5): qty 1

## 2023-05-05 MED ORDER — HYDROXYZINE HCL 25 MG PO TABS: 25.0000 mg | ORAL_TABLET | Freq: Three times a day (TID) | ORAL | Status: DC | PRN

## 2023-05-05 MED ORDER — BUDESONIDE 0.25 MG/2ML IN SUSP
0.2500 mg | Freq: Two times a day (BID) | RESPIRATORY_TRACT | Status: DC
  Administered 2023-05-05 – 2023-05-06 (×3): 0.25 mg via RESPIRATORY_TRACT
  Filled 2023-05-05 (×4): qty 2

## 2023-05-05 MED ORDER — GUAIFENESIN-DM 100-10 MG/5ML PO SYRP
5.0000 mL | ORAL_SOLUTION | ORAL | Status: DC | PRN
  Administered 2023-05-07: 5 mL via ORAL
  Filled 2023-05-05: qty 5

## 2023-05-05 MED ORDER — DOXYCYCLINE HYCLATE 100 MG PO TABS
100.0000 mg | ORAL_TABLET | Freq: Two times a day (BID) | ORAL | Status: DC
  Administered 2023-05-05 – 2023-05-07 (×5): 100 mg via ORAL
  Filled 2023-05-05 (×5): qty 1

## 2023-05-05 MED ORDER — ORAL CARE MOUTH RINSE: 15.0000 mL | OROMUCOSAL | Status: DC | PRN

## 2023-05-05 MED ORDER — ALBUTEROL SULFATE (2.5 MG/3ML) 0.083% IN NEBU
2.5000 mg | INHALATION_SOLUTION | RESPIRATORY_TRACT | Status: DC | PRN
  Administered 2023-05-06 – 2023-05-07 (×2): 2.5 mg via RESPIRATORY_TRACT
  Filled 2023-05-05 (×2): qty 3

## 2023-05-05 MED ORDER — SODIUM CHLORIDE 0.9 % IV SOLN: INTRAVENOUS | Status: AC

## 2023-05-05 MED ORDER — CEFTRIAXONE SODIUM 1 G IJ SOLR
1.0000 g | Freq: Once | INTRAMUSCULAR | Status: AC
Start: 2023-05-05 — End: 2023-05-05
  Administered 2023-05-05: 1 g via INTRAMUSCULAR
  Filled 2023-05-05: qty 10

## 2023-05-05 MED ORDER — METHYLPREDNISOLONE SODIUM SUCC 40 MG IJ SOLR
40.0000 mg | Freq: Two times a day (BID) | INTRAMUSCULAR | Status: DC
  Administered 2023-05-05 – 2023-05-07 (×4): 40 mg via INTRAVENOUS
  Filled 2023-05-05 (×4): qty 1

## 2023-05-05 NOTE — Assessment & Plan Note (Addendum)
 Positive urethritis with mucopurulent discharge.  Follow up with urine testing for chlamydia and gonorrhea.  HIV non reactive  Empiric therapy with ceftriaxone  x1 and doxycyline for 7 days.

## 2023-05-05 NOTE — Assessment & Plan Note (Addendum)
 Follow up as outpatient.  Continue with fluoxetine .

## 2023-05-05 NOTE — Hospital Course (Addendum)
 [name] was admitted to the hospital with the working diagnosis of COPD exacerbation.   53 yo male with the past medical history of COPD, polysubstance abuse, anxiety and depression who presented with dyspnea. Reported dyspnea and wheezing for the last 3 days prior to admission. He has been using TCH, cocaine and smoking cigarettes. On the day of admission his dyspnea was severe to the point where he decided to call EMS. He was found in respiratory distress, placed on Cpap and transported to the hospital.  On his initial physical examination his blood pressure was 146/87, HR 121, RR 28 and 02 saturation 99^% on bipap.  Lungs with expiratory wheezing with no rhonchi, heart with S1 and S2 present and regular, abdomen with no distention and no lower extremity edema.   VBG 7.32/ 60/ 42/ 30/ 72%  Na 139, K 3,8 Cl 105 bicarbonate 25 glucose 138 bun 10 cr 0,90  Wbc 10,0 hgb 14,9 plt 342  Sars covid 19 negative  Influenza negative  RSV negative   Toxicology positive for cocaine and benzodiazepines.   Chest radiograph with significant hyperinflation with no cardiomegaly, no effusions or infiltrates.   EKG 114 bpm, normal axis, normal intervals. Qtc 440, sinus rhythm with PAC, with no significant ST segment or T wave changes.   Patient was placed on aggressive bronchodilator therapy and systemic corticosteroids Inhaled corticosteroids 05/03 significant improvement in his symptoms and improved ventilation.  Plan to continue therapy at home and have close follow up as outpatient.  Smoking cessation counseling.

## 2023-05-05 NOTE — Progress Notes (Addendum)
  Progress Note   Patient: Franklin [name] ZOX:[id number] DOB: 21-Mar-1970 DOA: 05/04/2023     0 DOS: the patient was seen and examined on 05/05/2023   Brief hospital course: [name] was admitted to the hospital with the working diagnosis of COPD exacerbation.   53 yo male with the past medical history of COPD, polysubstance abuse, anxiety and depression who presented with dyspnea. Reported dyspnea and wheezing for the last 3 days prior to admission. He has been using TCH, cocaine and smoking cigarettes. On the day of admission his dyspnea was severe to the point where he decided to call EMS. He was found in respiratory distress, placed on Cpap and transported to the hospital.  On his initial physical examination his blood pressure was 146/87, HR 121, RR 28 and 02 saturation 99^% on bipap.  Lungs with expiratory wheezing with no rhonchi, heart with S1 and S2 present and regular, abdomen with no distention and no lower extremity edema.   Toxicology positive for cocaine and benzodiazepines.    Chest radiograph with significant hyperinflation with no cardiomegaly, no effusions or infiltrates.    Assessment and Plan: * COPD exacerbation (HCC) Continue to have significant signs of air trapping.   Plan to continue aggressive bronchodilator therapy, as needed and scheduled duoneb. Inhaled and systemic corticosteroids, (change from prednisone  to IV methylprednisolone ).  Continue oxymetry monitoring  Continue airway clearing techniques with flutter valve and incentive spirometer.  Out of bed to chair tid and encourage mobility.   Prediabetes Continue glucose monitoring  If hyperglycemia will plan to start insulin coverage.   Cocaine use disorder, severe, dependence (HCC) Advices to avoid substance abuse.  Add as needed lorazepam  for anxiety.   Major depressive disorder, recurrent severe without psychotic features (HCC) Follow up as outpatient.  Continue with fluoxetine .   STD  (male) Positive urethritis with mucopurulent discharge.  Will plan to for urine testing for chlamydia and gonorrhea.  (HIV) reflex Empiric therapy with ceftriaxone  and doxycyline.     Subjective: Patient continue to have dyspnea, cough and wheezing, not yet back to baseline, this morning positive anxiety, but not agitation   Physical Exam: Vitals:   05/05/23 1000 05/05/23 1148 05/05/23 1323 05/05/23 1409  BP: 117/71 (!) 139/91    Pulse: (!) 119 (!) 106    Resp: 20 20 (!) 21   Temp: 98.4 F (36.9 C) 98.4 F (36.9 C)    TempSrc: Oral Oral    SpO2: 96% 96%  96%  Weight:      Height:       Neurology awake and alert, deconditioned ENT with mild pallor  Cardiovascular with S1 and S2 present and regular with no gallops, rubs or murmurs  Respiratory with decreased air movement and prolonged expiratory phase, with positive expiratory wheezing and rhonchi, no rales Abdomen with no distention  No lower extremity edema  Data Reviewed:    Family Communication: no family at the bedside   Disposition: Status is: Observation The patient remains OBS appropriate and will d/c before 2 midnights.  Planned Discharge Destination: Home     Author: Albertus Alt, MD 05/05/2023 2:33 PM  For on call review [email or web address].

## 2023-05-05 NOTE — TOC Initial Note (Signed)
 Transition of Care [facility]) - Initial/Assessment Note    Patient Details  Name: Franklin [name] MRN: [id number] Date of Birth: 1970/11/01  [facility] CM/SW Contact:    Arron Big, LCSWA Phone Number: 05/05/2023, 10:24 AM  Clinical Narrative:     CSW met patient at bedside and provided him with substance use resources and SDOH resources for food, housing, and transportation. CSW informed patient that we do not assist with substance use placement from this setting. Patient expressed his understanding verbally.   TOC will continue to follow.                Expected Discharge Plan:  (TBD) Barriers to Discharge: Continued Medical Work up   Patient Goals and CMS Choice Patient states their goals for this hospitalization and ongoing recovery are:: To get better          Expected Discharge Plan and Services In-house Referral: Clinical Social Work     Living arrangements for the past 2 months: Homeless                                      Prior Living Arrangements/Services Living arrangements for the past 2 months: Homeless Lives with:: Self Patient language and need for interpreter reviewed:: No Do you feel safe going back to the place where you live?: Yes      Need for Family Participation in Patient Care: No (Comment) Care giver support system in place?: No (comment)   Criminal Activity/Legal Involvement Pertinent to Current Situation/Hospitalization: No - Comment as needed  Activities of Daily Living   ADL Screening (condition at time of admission) Independently performs ADLs?: Yes (appropriate for developmental age) Is the patient deaf or have difficulty hearing?: No Does the patient have difficulty seeing, even when wearing glasses/contacts?: No Does the patient have difficulty concentrating, remembering, or making decisions?: No  Permission Sought/Granted   Permission granted to share information with : No              Emotional  Assessment Appearance:: Appears stated age Attitude/Demeanor/Rapport: Engaged, Lethargic Affect (typically observed): Calm Orientation: : Oriented to Self, Oriented to Place, Oriented to  Time, Oriented to Situation Alcohol  / Substance Use: Not Applicable Psych Involvement: No (comment)  Admission diagnosis:  COPD exacerbation (HCC) [J44.1] Patient Active Problem List   Diagnosis Date Noted   Malingering 02/27/2023   MDD (major depressive disorder) 02/27/2023   Cocaine abuse with cocaine-induced mood disorder (HCC) 12/03/2022   Major depressive disorder, recurrent severe without psychotic features (HCC) 10/27/2022   PVC (premature ventricular contraction) 07/13/2022   Solitary pulmonary nodule 07/13/2022   Prediabetes 07/13/2022   Abnormal echocardiogram 07/13/2022   Chronic obstructive pulmonary disease (HCC) 06/18/2022   COPD exacerbation (HCC) 05/24/2022   Generalized anxiety disorder    Substance abuse (HCC) 03/22/2015   Tobacco abuse 10/06/2014   Cocaine use disorder, severe, dependence (HCC) 10/02/2014   Alcohol  use disorder, severe, dependence (HCC) 06/18/2014   Suicidal ideation 04/08/2012   PCP:  Patient, No Pcp Per Pharmacy:   [facility] [address] [place] [address] Phone: [phone] Fax: [phone]  [facility] [place], [place] - [place] [address] [place] [address] Phone: [phone] Fax: [phone]  [id number] [place] - [address]  ([place]) [place] [address] Phone:  [phone] Fax: [phone]     Social Drivers of Health (SDOH) Social History: SDOH Screenings   Food Insecurity: Food Insecurity Present (05/05/2023)  Housing: High Risk (05/05/2023)  Transportation Needs: Unmet Transportation Needs (05/05/2023)  Utilities: Patient Declined (05/05/2023)  Alcohol  Screen: High Risk  (02/27/2023)  Depression (PHQ2-9): Low Risk  (12/09/2022)  Recent Concern: Depression (PHQ2-9) - Medium Risk (12/06/2022)  Social Connections: Patient Declined (05/05/2023)  Tobacco Use: High Risk (05/04/2023)   SDOH Interventions:     Readmission Risk Interventions     No data to display

## 2023-05-05 NOTE — Assessment & Plan Note (Addendum)
 Significant improvement in air movement and ventilation.  02 saturation today is 95% on room air.   Continue bronchodilator therapy as needed with duoneb and and rescue inhaler with albuterol .  Inhaled corticosteroids and oral prednisone  for 3 more days.   Continue airway clearing techniques with flutter valve and incentive spirometer.

## 2023-05-05 NOTE — Plan of Care (Signed)

## 2023-05-05 NOTE — Assessment & Plan Note (Addendum)
 Advised to avoid substance abuse.  Patient was placed on as needed lorazepam  for anxiety with good toleration. No withdrawal symptoms.

## 2023-05-05 NOTE — Assessment & Plan Note (Addendum)
 Mild hyperglycemia, steroid induced.

## 2023-05-06 DIAGNOSIS — F1420 Cocaine dependence, uncomplicated: Secondary | ICD-10-CM | POA: Diagnosis not present

## 2023-05-06 DIAGNOSIS — F332 Major depressive disorder, recurrent severe without psychotic features: Secondary | ICD-10-CM | POA: Diagnosis not present

## 2023-05-06 DIAGNOSIS — R7303 Prediabetes: Secondary | ICD-10-CM | POA: Diagnosis not present

## 2023-05-06 DIAGNOSIS — J441 Chronic obstructive pulmonary disease with (acute) exacerbation: Secondary | ICD-10-CM | POA: Diagnosis not present

## 2023-05-06 LAB — BASIC METABOLIC PANEL WITH GFR
Anion gap: 9 (ref 5–15)
BUN: 10 mg/dL (ref 6–20)
CO2: 23 mmol/L (ref 22–32)
Calcium: 9 mg/dL (ref 8.9–10.3)
Chloride: 107 mmol/L (ref 98–111)
Creatinine, Ser: 0.9 mg/dL (ref 0.61–1.24)
GFR, Estimated: >60 mL/min (ref >60)
Glucose, Bld: 169 mg/dL — ABNORMAL HIGH (ref 70–99)
Potassium: 3.8 mmol/L (ref 3.5–5.1)
Sodium: 139 mmol/L (ref 135–145)

## 2023-05-06 LAB — URINE CYTOLOGY ANCILLARY ONLY
Chlamydia: NEGATIVE
Comment: NEGATIVE
Comment: NORMAL
Neisseria Gonorrhea: NEGATIVE

## 2023-05-06 LAB — CBC
HCT: 40.3 % (ref 39.0–52.0)
Hemoglobin: 13.2 g/dL (ref 13.0–17.0)
MCH: 28.8 pg (ref 26.0–34.0)
MCHC: 32.8 g/dL (ref 30.0–36.0)
MCV: 87.8 fL (ref 80.0–100.0)
Platelets: 317 10*3/uL (ref 150–400)
RBC: 4.59 MIL/uL (ref 4.22–5.81)
RDW: 15.1 % (ref 11.5–15.5)
WBC: 15.4 10*3/uL — ABNORMAL HIGH (ref 4.0–10.5)
nRBC: 0 % (ref 0.0–0.2)

## 2023-05-06 NOTE — Progress Notes (Signed)
  Progress Note   Patient: Franklin [name] ZOX:[id number] DOB: 06-08-1970 DOA: 05/04/2023     0 DOS: the patient was seen and examined on 05/06/2023   Brief hospital course: [name] was admitted to the hospital with the working diagnosis of COPD exacerbation.   53 yo male with the past medical history of COPD, polysubstance abuse, anxiety and depression who presented with dyspnea. Reported dyspnea and wheezing for the last 3 days prior to admission. He has been using TCH, cocaine and smoking cigarettes. On the day of admission his dyspnea was severe to the point where he decided to call EMS. He was found in respiratory distress, placed on Cpap and transported to the hospital.  On his initial physical examination his blood pressure was 146/87, HR 121, RR 28 and 02 saturation 99^% on bipap.  Lungs with expiratory wheezing with no rhonchi, heart with S1 and S2 present and regular, abdomen with no distention and no lower extremity edema.   Toxicology positive for cocaine and benzodiazepines.    Chest radiograph with significant hyperinflation with no cardiomegaly, no effusions or infiltrates.    Assessment and Plan: * COPD exacerbation (HCC) Improved air movement but not yet back to baseline  02 saturation is 95% on room air.   Continue aggressive bronchodilator therapy, as needed and scheduled duoneb. Inhaled corticosteroids  IV methylprednisolone   Continue oxymetry monitoring  Continue airway clearing techniques with flutter valve and incentive spirometer.  Out of bed to chair tid and encourage mobility.   Prediabetes Continue glucose monitoring  Mild hyperglycemia, will continue to hold on insulin therapy for now.  Fasting glucose this am 169 mg/dl   Cocaine use disorder, severe, dependence (HCC) Advised to avoid substance abuse.  Continue with as needed lorazepam  for anxiety.   Major depressive disorder, recurrent severe without psychotic features (HCC) Follow up as outpatient.   Continue with fluoxetine .   STD (male) Positive urethritis with mucopurulent discharge.  Follow up with urine testing for chlamydia and gonorrhea.  HIV non reactive  Empiric therapy with ceftriaxone  x1 and doxycyline for 7 days.        Subjective: Patient with improvement in dyspnea but not yet back to baseline, no chest pain,   Physical Exam: Vitals:   05/06/23 0710 05/06/23 0724 05/06/23 1114 05/06/23 1350  BP:  (!) 139/91 132/75   Pulse:   (!) 104   Resp:  20 20   Temp:  98.2 F (36.8 C) (!) 97.5 F (36.4 C)   TempSrc:  Axillary Oral   SpO2: 93% 98% 95% 99%  Weight:      Height:       Neurology awake and alert ENT with no pallor Cardiovascular with S1 and S2 present and regular with no gallops, rubs or murmurs Respiratory with positive expiratory wheezing and prolonged expiratory phase with no rhonchi or rales, air movement improved from yesterday  Abdomen with no distention  No lower extremity edema  Data Reviewed:    Family Communication: no family at the bedside   Disposition: Status is: Observation The patient remains OBS appropriate and will d/c before 2 midnights.  Planned Discharge Destination: Home     Author: Albertus Alt, MD 05/06/2023 3:06 PM  For on call review [email or web address].

## 2023-05-06 NOTE — Plan of Care (Signed)

## 2023-05-06 NOTE — Progress Notes (Signed)
 1124: Pt requested letter noting his hospitalization and dates be faxed to his court-appointment lawyer in [place], as he reports he was supposed to be in court on 05/05/23. Letter written by MD, printed and faxed at this time to Oletta Berry at fax [phone] per pt request. See chart review/letter tab as needed.

## 2023-05-07 ENCOUNTER — Other Ambulatory Visit (HOSPITAL_COMMUNITY): Payer: Self-pay

## 2023-05-07 DIAGNOSIS — J441 Chronic obstructive pulmonary disease with (acute) exacerbation: Secondary | ICD-10-CM | POA: Diagnosis not present

## 2023-05-07 DIAGNOSIS — F1420 Cocaine dependence, uncomplicated: Secondary | ICD-10-CM | POA: Diagnosis not present

## 2023-05-07 DIAGNOSIS — R7303 Prediabetes: Secondary | ICD-10-CM | POA: Diagnosis not present

## 2023-05-07 DIAGNOSIS — F332 Major depressive disorder, recurrent severe without psychotic features: Secondary | ICD-10-CM | POA: Diagnosis not present

## 2023-05-07 MED ORDER — IPRATROPIUM-ALBUTEROL 0.5-2.5 (3) MG/3ML IN SOLN
3.0000 mL | Freq: Four times a day (QID) | RESPIRATORY_TRACT | 0 refills | Status: DC | PRN
  Filled 2023-05-07: qty 360, 30d supply, fill #0

## 2023-05-07 MED ORDER — DOXYCYCLINE HYCLATE 100 MG PO TABS
100.0000 mg | ORAL_TABLET | Freq: Two times a day (BID) | ORAL | 0 refills | Status: DC
  Filled 2023-05-07: qty 8, 4d supply, fill #0

## 2023-05-07 MED ORDER — FLUOXETINE HCL 10 MG PO CAPS
30.0000 mg | ORAL_CAPSULE | Freq: Every day | ORAL | 0 refills | Status: DC
  Filled 2023-05-07: qty 90, 30d supply, fill #0

## 2023-05-07 MED ORDER — ALBUTEROL SULFATE HFA 108 (90 BASE) MCG/ACT IN AERS
2.0000 | INHALATION_SPRAY | Freq: Four times a day (QID) | RESPIRATORY_TRACT | 0 refills | Status: DC | PRN
  Filled 2023-05-07: qty 6.7, 25d supply, fill #0

## 2023-05-07 MED ORDER — PREDNISONE 20 MG PO TABS
40.0000 mg | ORAL_TABLET | Freq: Every day | ORAL | 0 refills | Status: DC
  Filled 2023-05-07: qty 6, 3d supply, fill #0

## 2023-05-07 NOTE — Plan of Care (Signed)
  Problem: Education: Goal: Knowledge of General Education information will improve Description: Including pain rating scale, medication(s)/side effects and non-pharmacologic comfort measures Outcome: Progressing   Problem: Clinical Measurements: Goal: Cardiovascular complication will be avoided Outcome: Progressing   Problem: Elimination: Goal: Will not experience complications related to urinary retention Outcome: Progressing   Problem: Clinical Measurements: Goal: Ability to maintain clinical measurements within normal limits will improve Outcome: Not Progressing Goal: Will remain free from infection Outcome: Not Progressing Goal: Respiratory complications will improve Outcome: Not Progressing

## 2023-05-07 NOTE — Discharge Summary (Signed)
 Physician Discharge Summary   Patient: Franklin [name] MRN: [id number] DOB: 1970-02-18  Admit date:     05/04/2023  Discharge date: 05/07/23  Discharge Physician: Franklin [name]   PCP: Patient, No Pcp Per   Recommendations at discharge:    Patient will continue prednisone  40 mg daily for 3 more days.  Continue as needed bronchodilator therapy Smoking cessation counseling Complete 7 days of doxycycline  for suspected STD, urine micro for gonorrhea and chlamydia pending. (He has one dose of IM ceftriaxone ).  Follow up with primary care in 7 to 10 days.     Discharge Diagnoses: Principal Problem:   COPD exacerbation (HCC) Active Problems:   Prediabetes   Cocaine use disorder, severe, dependence (HCC)   Major depressive disorder, recurrent severe without psychotic features (HCC)   STD (male)  Resolved Problems:   * No resolved hospital problems. [facility] Course: [name] was admitted to the hospital with the working diagnosis of COPD exacerbation.   53 yo male with the past medical history of COPD, polysubstance abuse, anxiety and depression who presented with dyspnea. Reported dyspnea and wheezing for the last 3 days prior to admission. He has been using TCH, cocaine and smoking cigarettes. On the day of admission his dyspnea was severe to the point where he decided to call EMS. He was found in respiratory distress, placed on Cpap and transported to the hospital.  On his initial physical examination his blood pressure was 146/87, HR 121, RR 28 and 02 saturation 99^% on bipap.  Lungs with expiratory wheezing with no rhonchi, heart with S1 and S2 present and regular, abdomen with no distention and no lower extremity edema.   VBG 7.32/ 60/ 42/ 30/ 72%  Na 139, K 3,8 Cl 105 bicarbonate 25 glucose 138 bun 10 cr 0,90  Wbc 10,0 hgb 14,9 plt 342  Sars covid 19 negative  Influenza negative  RSV negative   Toxicology positive for cocaine and benzodiazepines.   Chest  radiograph with significant hyperinflation with no cardiomegaly, no effusions or infiltrates.   EKG 114 bpm, normal axis, normal intervals. Qtc 440, sinus rhythm with PAC, with no significant ST segment or T wave changes.   Patient was placed on aggressive bronchodilator therapy and systemic corticosteroids Inhaled corticosteroids 05/03 significant improvement in his symptoms and improved ventilation.  Plan to continue therapy at home and have close follow up as outpatient.  Smoking cessation counseling.    Assessment and Plan: * COPD exacerbation (HCC) Significant improvement in air movement and ventilation.  02 saturation today is 95% on room air.   Continue bronchodilator therapy as needed with duoneb and and rescue inhaler with albuterol .  Inhaled corticosteroids and oral prednisone  for 3 more days.   Continue airway clearing techniques with flutter valve and incentive spirometer.   Prediabetes Mild hyperglycemia, steroid induced.   Cocaine use disorder, severe, dependence (HCC) Advised to avoid substance abuse.  Patient was placed on as needed lorazepam  for anxiety with good toleration. No withdrawal symptoms.    Major depressive disorder, recurrent severe without psychotic features (HCC) Follow up as outpatient.  Continue with fluoxetine .   STD (male) Positive urethritis with mucopurulent discharge.  Follow up with urine testing for chlamydia and gonorrhea.  HIV non reactive  Empiric therapy with ceftriaxone  x1 and doxycyline for 7 days.           Consultants: none  Procedures performed: none   Disposition: Home Diet recommendation:  Regular diet DISCHARGE MEDICATION: Allergies as of 05/07/2023  No Known Allergies      Medication List     STOP taking these medications    AeroChamber MV inhaler   melatonin 5 MG Tabs   Vitamin D  (Ergocalciferol ) 1.25 MG (50000 UNIT) Caps capsule Commonly known as: DRISDOL        TAKE these medications     albuterol  108 (90 Base) MCG/ACT inhaler Commonly known as: VENTOLIN  HFA Inhale 2 puffs into the lungs every 6 (six) hours as needed for wheezing or shortness of breath. What changed: Another medication with the same name was removed. Continue taking this medication, and follow the directions you see here.   doxycycline  100 MG tablet Commonly known as: VIBRA -TABS Take 1 tablet (100 mg total) by mouth every 12 (twelve) hours for 4 days.   FLUoxetine  10 MG capsule Commonly known as: PROZAC  Take 3 capsules (30 mg total) by mouth daily. Start taking on: May 08, 2023   ipratropium-albuterol  0.5-2.5 (3) MG/3ML Soln Commonly known as: DUONEB Take 3 mLs by nebulization every 6 (six) hours as needed.   predniSONE  20 MG tablet Commonly known as: DELTASONE  Take 2 tablets (40 mg total) by mouth daily with breakfast for 3 days. What changed:  medication strength how much to take how to take this when to take this additional instructions               Durable Medical Equipment  (From admission, onward)           Start     Ordered   05/07/23 0000  For home use only DME Nebulizer machine       Question Answer Comment  Patient needs a nebulizer to treat with the following condition Asthma   Length of Need 12 Months   Additional equipment included Administration kit      05/07/23 1023            Follow-up Information     Schedule an appointment as soon as possible for a visit  with [facility] with your PCP/Specialist as discussed.   Contact information: [email or web address] Call our physician referral line at [phone].               Discharge Exam: Filed Weights   05/05/23 0018 05/06/23 0612 05/07/23 0544  Weight: 76.5 kg 77.3 kg 75 kg   BP 127/87   Pulse 93   Temp 97.6 F (36.4 C) (Oral)   Resp 20   Ht 5\' 10"  (1.778 m)   Wt 75 kg   SpO2 95%   BMI 23.72 kg/m   Patient with significant improvement in his dyspnea, no chest  pain, tolerating po well.   Neurology awake and alert ENT with no pallor Cardiovascular with S1 and S2 present and regular with no gallops, rubs or murmurs Respiratory with improved ventilation, this morning with no wheezing or rhonchi, no rales.  Mild prolonged expiratory phase.  Abdomen with no distention  No lower extremity edema.   Condition at discharge: stable  The results of significant diagnostics from this hospitalization (including imaging, microbiology, ancillary and laboratory) are listed below for reference.   Imaging Studies: DG Chest Portable 1 View Result Date: 05/04/2023 CLINICAL DATA:  Cough, short of breath for 2-3 days EXAM: PORTABLE CHEST 1 VIEW COMPARISON:  04/29/2023 FINDINGS: 2 frontal views of the chest demonstrate an unremarkable cardiac silhouette. No acute airspace disease, effusion, or pneumothorax. No acute bony abnormalities. IMPRESSION: 1. No acute intrathoracic process. Electronically Signed   By: Bari Boos.D.  On: 05/04/2023 20:43   DG Chest Port 1 View Result Date: 04/29/2023 CLINICAL DATA:  Shortness of breath. EXAM: PORTABLE CHEST 1 VIEW COMPARISON:  February 27, 2023 FINDINGS: The heart size and mediastinal contours are within normal limits. Both lungs are clear. The visualized skeletal structures are unremarkable. IMPRESSION: No active disease. Electronically Signed   By: Virgle Grime M.D.   On: 04/29/2023 02:29    Microbiology: Results for orders placed or performed during the hospital encounter of 05/04/23  Resp panel by RT-PCR (RSV, Flu A&B, Covid) Anterior Nasal Swab     Status: None   Collection Time: 05/04/23  8:22 PM   Specimen: Anterior Nasal Swab  Result Value Ref Range Status   SARS Coronavirus 2 by RT PCR NEGATIVE NEGATIVE Final   Influenza A by PCR NEGATIVE NEGATIVE Final   Influenza B by PCR NEGATIVE NEGATIVE Final    Comment: (NOTE) The Xpert Xpress SARS-CoV-2/FLU/RSV plus assay is intended as an aid in the diagnosis  of influenza from Nasopharyngeal swab specimens and should not be used as a sole basis for treatment. Nasal washings and aspirates are unacceptable for Xpert Xpress SARS-CoV-2/FLU/RSV testing.  Fact Sheet for Patients: BloggerCourse.com  Fact Sheet for Healthcare Providers: SeriousBroker.it  This test is not yet approved or cleared by the [place]  FDA and has been authorized for detection and/or diagnosis of SARS-CoV-2 by FDA under an Emergency Use Authorization (EUA). This EUA will remain in effect (meaning this test can be used) for the duration of the COVID-19 declaration under Section 564(b)(1) of the Act, 21 U.S.C. section 360bbb-3(b)(1), unless the authorization is terminated or revoked.     Resp Syncytial Virus by PCR NEGATIVE NEGATIVE Final    Comment: (NOTE) Fact Sheet for Patients: BloggerCourse.com  Fact Sheet for Healthcare Providers: SeriousBroker.it  This test is not yet approved or cleared by the [place]  FDA and has been authorized for detection and/or diagnosis of SARS-CoV-2 by FDA under an Emergency Use Authorization (EUA). This EUA will remain in effect (meaning this test can be used) for the duration of the COVID-19 declaration under Section 564(b)(1) of the Act, 21 U.S.C. section 360bbb-3(b)(1), unless the authorization is terminated or revoked.  Performed at [facility], [address] [place], [place] [address]     Labs: CBC: Recent Labs  Lab 05/04/23 2022 05/06/23 0329  WBC 10.0 15.4*  NEUTROABS 5.8  --   HGB 14.9 13.2  HCT 46.2 40.3  MCV 89.7 87.8  PLT 342 317   Basic Metabolic Panel: Recent Labs  Lab 05/04/23 2022 05/06/23 0329  NA 139 139  K 3.8 3.8  CL 106 107  CO2 25 23  GLUCOSE 138* 169*  BUN 10 10  CREATININE 0.90 0.90  CALCIUM 9.0 9.0   Liver Function Tests: No results for input(s): "AST", "ALT",  "ALKPHOS", "BILITOT", "PROT", "ALBUMIN" in the last 168 hours. CBG: No results for input(s): "GLUCAP" in the last 168 hours.  Discharge time spent: greater than 30 minutes.  Signed: Albertus Alt, MD [facility] 05/07/2023

## 2023-05-07 NOTE — Care Management (Addendum)
 Nebulizer to be delivered to the room by [employer], cab voucher provided (left at nurse's station)  [facility] MEDICATION ASSISTANCE CARD Pharmacies please call: [phone] for claim processing assistance.  Rx BIN: [id number] Rx Group: [id number] Rx PCN: [id number] Relationship Code: 1 Person Code: 01  Patient ID (MRN): [id number]   Patient Name: Franklin [name]     Patient DOB: Oct 13, 1970    Discharge Date: 05/07/2023    Expiration Date: 05/15/2023 (must be filled within 7 days of discharge)  MATCH provided to [facility] pharmacy

## 2023-05-09 ENCOUNTER — Other Ambulatory Visit (HOSPITAL_COMMUNITY): Payer: Self-pay

## 2023-05-09 ENCOUNTER — Emergency Department (HOSPITAL_COMMUNITY): Payer: MEDICAID

## 2023-05-09 ENCOUNTER — Emergency Department (HOSPITAL_COMMUNITY)
Admission: EM | Admit: 2023-05-09 | Discharge: 2023-05-09 | Disposition: A | Discharge status: Home / Self Care | Payer: MEDICAID | Attending: Emergency Medicine | Admitting: Emergency Medicine

## 2023-05-09 ENCOUNTER — Encounter (HOSPITAL_COMMUNITY): Payer: Self-pay

## 2023-05-09 ENCOUNTER — Other Ambulatory Visit: Payer: Self-pay

## 2023-05-09 DIAGNOSIS — I4891 Unspecified atrial fibrillation: Secondary | ICD-10-CM

## 2023-05-09 DIAGNOSIS — J441 Chronic obstructive pulmonary disease with (acute) exacerbation: Secondary | ICD-10-CM | POA: Diagnosis not present

## 2023-05-09 DIAGNOSIS — F1410 Cocaine abuse, uncomplicated: Secondary | ICD-10-CM | POA: Insufficient documentation

## 2023-05-09 DIAGNOSIS — R059 Cough, unspecified: Secondary | ICD-10-CM | POA: Insufficient documentation

## 2023-05-09 DIAGNOSIS — Z5900 Homelessness unspecified: Secondary | ICD-10-CM | POA: Insufficient documentation

## 2023-05-09 DIAGNOSIS — R0602 Shortness of breath: Secondary | ICD-10-CM | POA: Diagnosis present

## 2023-05-09 DIAGNOSIS — Z720 Tobacco use: Secondary | ICD-10-CM | POA: Diagnosis not present

## 2023-05-09 DIAGNOSIS — F1490 Cocaine use, unspecified, uncomplicated: Secondary | ICD-10-CM

## 2023-05-09 HISTORY — DX: Unspecified atrial fibrillation: I48.91

## 2023-05-09 LAB — CBC
HCT: 47.1 % (ref 39.0–52.0)
Hemoglobin: 15.5 g/dL (ref 13.0–17.0)
MCH: 28.9 pg (ref 26.0–34.0)
MCHC: 32.9 g/dL (ref 30.0–36.0)
MCV: 87.9 fL (ref 80.0–100.0)
Platelets: 369 10*3/uL (ref 150–400)
RBC: 5.36 MIL/uL (ref 4.22–5.81)
RDW: 14.9 % (ref 11.5–15.5)
WBC: 10.4 10*3/uL (ref 4.0–10.5)
nRBC: 0 % (ref 0.0–0.2)

## 2023-05-09 LAB — RESP PANEL BY RT-PCR (RSV, FLU A&B, COVID)  RVPGX2
Influenza A by PCR: NEGATIVE
Influenza B by PCR: NEGATIVE
Resp Syncytial Virus by PCR: NEGATIVE
SARS Coronavirus 2 by RT PCR: NEGATIVE

## 2023-05-09 LAB — BLOOD GAS, VENOUS
Acid-Base Excess: 3.5 mmol/L — ABNORMAL HIGH (ref 0.0–2.0)
Bicarbonate: 29.1 mmol/L — ABNORMAL HIGH (ref 20.0–28.0)
O2 Saturation: 98.8 %
Patient temperature: 37
pCO2, Ven: 47 mmHg (ref 44–60)
pH, Ven: 7.4 (ref 7.25–7.43)
pO2, Ven: 89 mmHg — ABNORMAL HIGH (ref 32–45)

## 2023-05-09 LAB — COMPREHENSIVE METABOLIC PANEL WITH GFR
ALT: 24 U/L (ref 0–44)
AST: 30 U/L (ref 15–41)
Albumin: 3.8 g/dL (ref 3.5–5.0)
Alkaline Phosphatase: 57 U/L (ref 38–126)
Anion gap: 11 (ref 5–15)
BUN: 15 mg/dL (ref 6–20)
CO2: 26 mmol/L (ref 22–32)
Calcium: 9.1 mg/dL (ref 8.9–10.3)
Chloride: 102 mmol/L (ref 98–111)
Creatinine, Ser: 1.08 mg/dL (ref 0.61–1.24)
GFR, Estimated: >60 mL/min (ref >60)
Glucose, Bld: 191 mg/dL — ABNORMAL HIGH (ref 70–99)
Potassium: 4.1 mmol/L (ref 3.5–5.1)
Sodium: 139 mmol/L (ref 135–145)
Total Bilirubin: 1.1 mg/dL (ref 0.0–1.2)
Total Protein: 6.7 g/dL (ref 6.5–8.1)

## 2023-05-09 MED ORDER — DOXYCYCLINE HYCLATE 100 MG PO TABS
100.0000 mg | ORAL_TABLET | Freq: Two times a day (BID) | ORAL | 0 refills | Status: AC
  Filled 2023-05-09: qty 8, 4d supply, fill #0

## 2023-05-09 MED ORDER — ALBUTEROL SULFATE (2.5 MG/3ML) 0.083% IN NEBU
2.5000 mg | INHALATION_SOLUTION | Freq: Once | RESPIRATORY_TRACT | Status: AC
  Administered 2023-05-09: 2.5 mg via RESPIRATORY_TRACT
  Filled 2023-05-09: qty 3

## 2023-05-09 MED ORDER — ALBUTEROL SULFATE HFA 108 (90 BASE) MCG/ACT IN AERS
2.0000 | INHALATION_SPRAY | Freq: Once | RESPIRATORY_TRACT | Status: AC
  Administered 2023-05-09: 2 via RESPIRATORY_TRACT
  Filled 2023-05-09: qty 6.7

## 2023-05-09 MED ORDER — PREDNISONE 10 MG PO TABS
ORAL_TABLET | ORAL | 0 refills | Status: DC
  Filled 2023-05-09: qty 39, 12d supply, fill #0

## 2023-05-09 MED ORDER — ALBUTEROL SULFATE (2.5 MG/3ML) 0.083% IN NEBU
10.0000 mg/h | INHALATION_SOLUTION | Freq: Once | RESPIRATORY_TRACT | Status: AC
  Administered 2023-05-09: 10 mg/h via RESPIRATORY_TRACT
  Filled 2023-05-09: qty 3

## 2023-05-09 MED ORDER — MAGNESIUM SULFATE 2 GM/50ML IV SOLN
2.0000 g | Freq: Once | INTRAVENOUS | Status: AC
Start: 2023-05-09 — End: 2023-05-09
  Administered 2023-05-09: 2 g via INTRAVENOUS
  Filled 2023-05-09: qty 50

## 2023-05-09 MED ORDER — ALBUTEROL SULFATE (2.5 MG/3ML) 0.083% IN NEBU: 2.5000 mg | INHALATION_SOLUTION | Freq: Once | RESPIRATORY_TRACT | Status: DC

## 2023-05-09 NOTE — ED Notes (Signed)
 CCMD notified. Pt is on monitor.

## 2023-05-09 NOTE — Progress Notes (Signed)
 Transition of Care [facility]) - Emergency Department Mini Assessment   Patient Details  Name: Franklin [name] MRN: [id number] Date of Birth: 20-Jan-1970  Transition of Care [facility]) CM/SW Contact:    Joanette Moynahan, RN Phone Number: 05/09/2023, 11:47 AM   Clinical Narrative: TOC consulted regarding medication assistance.  RNCM reviewed chart to find that pt was enrolled in the [facility] program on Friday and is eligible to additional medications as the card has not expired yet.  RNCM contacted [facility] to ensure they received Rx and will deliver to pt at bedside prior to discharge.    ED Mini Assessment: What brought you to the Emergency Department? : (P) I couldn't catch my breath     Barrier interventions: (P) medication assistance  Means of departure: (P) Public Transportation  Interventions which prevented an admission or readmission: Medication Review    Patient Contact and Communications        ,          Patient states their goals for this hospitalization and ongoing recovery are:: (P) Feel better      Admission diagnosis:  SHOB/ Neb treatment Patient Active Problem List   Diagnosis Date Noted   A-fib [facility]    STD (male) 05/05/2023   Malingering 02/27/2023   MDD (major depressive disorder) 02/27/2023   Cocaine abuse with cocaine-induced mood disorder (HCC) 12/03/2022   Major depressive disorder, recurrent severe without psychotic features (HCC) 10/27/2022   PVC (premature ventricular contraction) 07/13/2022   Solitary pulmonary nodule 07/13/2022   Prediabetes 07/13/2022   Abnormal echocardiogram 07/13/2022   Chronic obstructive pulmonary disease (HCC) 06/18/2022   COPD exacerbation (HCC) 05/24/2022   Generalized anxiety disorder    Substance abuse (HCC) 03/22/2015   Tobacco abuse 10/06/2014   Cocaine use disorder, severe, dependence (HCC) 10/02/2014   Alcohol  use disorder, severe, dependence (HCC) 06/18/2014   Suicidal ideation 04/08/2012   PCP:  Patient, No  Pcp Per Pharmacy:   [facility] [address] [place] [address] Phone: [phone] Fax: [phone]  [facility] [place], [place] - [place] [address] [place] [address] Phone: [phone] Fax: [phone]  [facility] [id number] [place]), [place] - [address] [place]) [place] [address] Phone: [phone] Fax: [phone]  [facility] [address] [place] [address] Phone: [phone] Fax: [phone]

## 2023-05-09 NOTE — ED Provider Notes (Signed)
 [facility] Provider Note   CSN: [id number] Arrival date & time: 05/09/23  0848     History  Chief Complaint  Patient presents with   Shortness of Breath         Franklin [name] is a 53 y.o. male.  53 year old male with a history of cocaine abuse, homelessness, tobacco use, and COPD who presents to the emergency department with shortness of breath.  Patient reports that yesterday after being discharged from the hospital was doing well.  Did smoke some crack last night.  Says that this morning he woke up and his breathing was worse.  Says he had a persistent cough that is productive but it is improved somewhat since his hospitalization.  Also still having some chills.  His sister took all his medications including his prednisone  and so he has not received his dose the day.  911 was called and EMS gave him 125 mg of Solu-Medrol , Atrovent, and albuterol .       Home Medications Prior to Admission medications   Medication Sig Start Date End Date Taking? Authorizing Provider  predniSONE  (DELTASONE ) 10 MG tablet Take 6 tablets (60 mg total) by mouth daily for 3 days, THEN 4 tablets (40 mg total) daily for 3 days, THEN 2 tablets (20 mg total) daily for 3 days, THEN 1 tablet (10 mg total) daily for 3 days. 05/09/23 05/21/23 Yes Ninetta Basket, MD  albuterol  (VENTOLIN  HFA) 108 [phone] Base) MCG/ACT inhaler Inhale 2 puffs into the lungs every 6 (six) hours as needed for wheezing or shortness of breath. 05/07/23   Arrien, Mauricio Daniel, MD  doxycycline  (VIBRA -TABS) 100 MG tablet Take 1 tablet (100 mg total) by mouth every 12 (twelve) hours for 4 days. 05/09/23 05/13/23  Ninetta Basket, MD  FLUoxetine  (PROZAC ) 10 MG capsule Take 3 capsules (30 mg total) by mouth daily. 05/08/23   Arrien, Mauricio Daniel, MD  ipratropium-albuterol  (DUONEB) 0.5-2.5 (3) MG/3ML SOLN Take 3 mLs by nebulization every 6 (six) hours as needed. 05/07/23   Arrien, Curlee Doss, MD       Allergies    Patient has no known allergies.    Review of Systems   Review of Systems  Physical Exam Updated Vital Signs BP 130/72   Pulse (!) 40   Temp 98.4 F (36.9 C) (Oral)   Resp 19   Ht 5\' 10"  (1.778 m)   Wt 77.1 kg   SpO2 (!) 86%   BMI 24.39 kg/m  Physical Exam Vitals and nursing note reviewed.  Constitutional:      General: He is not in acute distress.    Appearance: He is well-developed.  HENT:     Head: Normocephalic and atraumatic.     Right Ear: External ear normal.     Left Ear: External ear normal.     Nose: Nose normal.  Eyes:     Extraocular Movements: Extraocular movements intact.     Conjunctiva/sclera: Conjunctivae normal.     Pupils: Pupils are equal, round, and reactive to light.  Cardiovascular:     Rate and Rhythm: Normal rate. Rhythm irregular.     Heart sounds: Normal heart sounds.  Pulmonary:     Effort: Pulmonary effort is normal. No respiratory distress.     Breath sounds: Normal breath sounds.     Comments: Speaking in full sentences Musculoskeletal:     Cervical back: Normal range of motion and neck supple.     Right lower leg:  No edema.     Left lower leg: No edema.  Skin:    General: Skin is warm and dry.  Neurological:     Mental Status: He is alert. Mental status is at baseline.  Psychiatric:        Mood and Affect: Mood normal.        Behavior: Behavior normal.     ED Results / Procedures / Treatments   Labs (all labs ordered are listed, but only abnormal results are displayed) Labs Reviewed  COMPREHENSIVE METABOLIC PANEL WITH GFR - Abnormal; Notable for the following components:      Result Value   Glucose, Bld 191 (*)    All other components within normal limits  BLOOD GAS, VENOUS - Abnormal; Notable for the following components:   pO2, Ven 89 (*)    Bicarbonate 29.1 (*)    Acid-Base Excess 3.5 (*)    All other components within normal limits  RESP PANEL BY RT-PCR (RSV, FLU A&B, COVID)  RVPGX2  CBC     EKG EKG Interpretation Date/Time:  Monday May 09 2023 10:23:48 EDT Ventricular Rate:  100 PR Interval:  120 QRS Duration:  108 QT Interval:  395 QTC Calculation: 510 R Axis:   50  Text Interpretation: Sinus tachycardia Atrial premature complex Consider right atrial enlargement ST elev, probable normal early repol pattern Prolonged QT interval Confirmed by Shyrl Doyne [phone]) on 05/09/2023 11:27:07 AM  Radiology DG Chest 2 View Result Date: 05/09/2023 CLINICAL DATA:  Shortness of breath. EXAM: CHEST - 2 VIEW COMPARISON:  05/04/2023. FINDINGS: The heart size and mediastinal contours are within normal limits. No focal consolidation, pleural effusion, or pneumothorax. No acute osseous abnormality. IMPRESSION: No acute cardiopulmonary findings. Electronically Signed   By: Mannie Seek M.D.   On: 05/09/2023 10:10    Procedures Procedures    Medications Ordered in ED Medications  albuterol  (PROVENTIL ) (2.5 MG/3ML) 0.083% nebulizer solution (10 mg/hr Nebulization Given 05/09/23 1000)  magnesium  sulfate IVPB 2 g 50 mL (0 g Intravenous Stopped 05/09/23 1138)  albuterol  (VENTOLIN  HFA) 108 (90 Base) MCG/ACT inhaler 2 puff (2 puffs Inhalation Given 05/09/23 1154)  albuterol  (PROVENTIL ) (2.5 MG/3ML) 0.083% nebulizer solution 2.5 mg (2.5 mg Nebulization Given 05/09/23 1155)    ED Course/ Medical Decision Making/ A&P                                 Medical Decision Making Amount and/or Complexity of Data Reviewed Labs: ordered. Radiology: ordered.  Risk Prescription drug management.   Franklin [name] is a 53 y.o. male with comorbidities that complicate the patient evaluation including cocaine abuse, homelessness, tobacco use, and COPD who presents to the emergency department with shortness of breath.    Initial Ddx:  COPD exacerbation, inhalational injury from a crack, pneumonia, URI, CHF exacerbation  MDM/Course:  Patient presents to the emergency department with wheezing and  respiratory symptoms.  This is after recently being discharged from the hospital.  Did appear to post smoke crack just prior to that happening.  Has not been taking his medications because he reports a family member left with them.  On exam does have diffuse expiratory wheezing.  Normal work of breathing and is speaking in full sentences.  He was satting 100% on room air at upon my initial evaluation.  Did give him a round of continuous albuterol .  Already received steroids from EMS.  Upon re-evaluation patient was still not  in any distress and was satting between 98 to 100% on room air.  Of note there was an 86% oxygen saturation documented by nursing but I believe this is a spurious value.  Lab work was unremarkable.  COVID and flu negative.  Chest x-ray without acute findings.  Patient counseled to stop smoking crack since I suspect is likely causing some irritation of his COPD.  Sent home on a longer prednisone  taper to take as well as antibiotics.  Also given an albuterol  inhaler upon discharge.  This patient presents to the ED for concern of complaints listed in HPI, this involves an extensive number of treatment options, and is a complaint that carries with it a high risk of complications and morbidity. Disposition including potential need for admission considered.   Dispo: DC Home. Return precautions discussed including, but not limited to, those listed in the AVS. Allowed pt time to ask questions which were answered fully prior to dc.  Records reviewed Outpatient Clinic Notes The following labs were independently interpreted: Chemistry and show no acute abnormality I independently reviewed the following imaging with scope of interpretation limited to determining acute life threatening conditions related to emergency care: Chest x-ray and agree with the radiologist interpretation with the following exceptions: none I personally reviewed and interpreted cardiac monitoring: normal sinus rhythm  I  personally reviewed and interpreted the pt's EKG: see above for interpretation  I have reviewed the patients home medications and made adjustments as needed Social Determinants of health:  Housing instability  Portions of this note were generated with Scientist, clinical (histocompatibility and immunogenetics). Dictation errors may occur despite best attempts at proofreading.     Final Clinical Impression(s) / ED Diagnoses Final diagnoses:  COPD exacerbation (HCC)  Crack cocaine use    Rx / DC Orders ED Discharge Orders          Ordered    predniSONE  (DELTASONE ) 10 MG tablet  Daily        05/09/23 1138    doxycycline  (VIBRA -TABS) 100 MG tablet  Every 12 hours        05/09/23 1138              Ninetta Basket, MD 05/09/23 2031

## 2023-05-09 NOTE — Discharge Instructions (Signed)
 You were seen for your COPD exacerbation in the emergency department.   At home, please use your inhalers for any wheezing.  Please take the steroids and doxycyline we have prescribed you for your COPD exacerbation as well.   Stop smoking crack because it can cause shortness of breath like you are experiencing.  Check your MyChart online for the results of any tests that had not resulted by the time you left the emergency department.   Follow-up with your primary doctor in 2-3 days regarding your visit.  If you do not have a primary care doctor you may follow-up with [facility] which is listed in this packet.  Return immediately to the emergency department if you experience any of the following: Difficulty breathing, severe chest pain, or any other concerning symptoms.    Thank you for visiting our Emergency Department. It was a pleasure taking care of you today.

## 2023-05-09 NOTE — ED Triage Notes (Signed)
 Pt was [facility] for SOB. Upon ems arrival pt was wheezing and haivng difficulty catching breath.   *Pt is currently is experiencing homelessness.  EMS gave 10albuterol,  atrovent  125 mg solu  EMS vs 95%RA  HXCOPD, AFIB

## 2023-05-09 NOTE — ED Notes (Signed)
 Pt transported to xray

## 2023-05-16 ENCOUNTER — Encounter: Payer: Self-pay | Admitting: Student

## 2023-05-16 ENCOUNTER — Other Ambulatory Visit: Payer: Self-pay

## 2023-05-16 ENCOUNTER — Observation Stay
Admission: EM | Admit: 2023-05-16 | Discharge: 2023-05-17 | Disposition: A | Discharge status: Home / Self Care | Payer: MEDICAID | Attending: Internal Medicine | Admitting: Internal Medicine

## 2023-05-16 ENCOUNTER — Emergency Department: Payer: MEDICAID

## 2023-05-16 DIAGNOSIS — J441 Chronic obstructive pulmonary disease with (acute) exacerbation: Principal | ICD-10-CM | POA: Diagnosis present

## 2023-05-16 DIAGNOSIS — F1490 Cocaine use, unspecified, uncomplicated: Secondary | ICD-10-CM | POA: Diagnosis not present

## 2023-05-16 DIAGNOSIS — F17210 Nicotine dependence, cigarettes, uncomplicated: Secondary | ICD-10-CM | POA: Diagnosis not present

## 2023-05-16 DIAGNOSIS — F32A Depression, unspecified: Secondary | ICD-10-CM | POA: Insufficient documentation

## 2023-05-16 DIAGNOSIS — F17200 Nicotine dependence, unspecified, uncomplicated: Secondary | ICD-10-CM | POA: Diagnosis present

## 2023-05-16 DIAGNOSIS — F419 Anxiety disorder, unspecified: Secondary | ICD-10-CM | POA: Diagnosis not present

## 2023-05-16 DIAGNOSIS — E876 Hypokalemia: Secondary | ICD-10-CM | POA: Diagnosis not present

## 2023-05-16 DIAGNOSIS — F10239 Alcohol dependence with withdrawal, unspecified: Secondary | ICD-10-CM | POA: Diagnosis not present

## 2023-05-16 DIAGNOSIS — F1920 Other psychoactive substance dependence, uncomplicated: Secondary | ICD-10-CM | POA: Insufficient documentation

## 2023-05-16 DIAGNOSIS — R0602 Shortness of breath: Secondary | ICD-10-CM | POA: Diagnosis present

## 2023-05-16 DIAGNOSIS — I480 Paroxysmal atrial fibrillation: Secondary | ICD-10-CM | POA: Diagnosis not present

## 2023-05-16 LAB — BASIC METABOLIC PANEL WITH GFR
Anion gap: 13 (ref 5–15)
BUN: 7 mg/dL (ref 6–20)
CO2: 24 mmol/L (ref 22–32)
Calcium: 9.1 mg/dL (ref 8.9–10.3)
Chloride: 102 mmol/L (ref 98–111)
Creatinine, Ser: 1.08 mg/dL (ref 0.61–1.24)
GFR, Estimated: >60 mL/min (ref >60)
Glucose, Bld: 125 mg/dL — ABNORMAL HIGH (ref 70–99)
Potassium: 3.2 mmol/L — ABNORMAL LOW (ref 3.5–5.1)
Sodium: 139 mmol/L (ref 135–145)

## 2023-05-16 LAB — CBC WITH DIFFERENTIAL/PLATELET
Abs Immature Granulocytes: 0.02 10*3/uL (ref 0.00–0.07)
Basophils Absolute: 0.1 10*3/uL (ref 0.0–0.1)
Basophils Relative: 1 %
Eosinophils Absolute: 0.2 10*3/uL (ref 0.0–0.5)
Eosinophils Relative: 3 %
HCT: 43.8 % (ref 39.0–52.0)
Hemoglobin: 14.3 g/dL (ref 13.0–17.0)
Immature Granulocytes: 0 %
Lymphocytes Relative: 30 %
Lymphs Abs: 2.4 10*3/uL (ref 0.7–4.0)
MCH: 29.4 pg (ref 26.0–34.0)
MCHC: 32.6 g/dL (ref 30.0–36.0)
MCV: 90.1 fL (ref 80.0–100.0)
Monocytes Absolute: 0.4 10*3/uL (ref 0.1–1.0)
Monocytes Relative: 5 %
Neutro Abs: 5.2 10*3/uL (ref 1.7–7.7)
Neutrophils Relative %: 61 %
Platelets: 348 10*3/uL (ref 150–400)
RBC: 4.86 MIL/uL (ref 4.22–5.81)
RDW: 15.2 % (ref 11.5–15.5)
WBC: 8.2 10*3/uL (ref 4.0–10.5)
nRBC: 0 % (ref 0.0–0.2)

## 2023-05-16 LAB — URINE DRUG SCREEN, QUALITATIVE (ARMC ONLY)
Amphetamines, Ur Screen: POSITIVE — AB
Barbiturates, Ur Screen: NOT DETECTED
Benzodiazepine, Ur Scrn: NOT DETECTED
Cannabinoid 50 Ng, Ur ~~LOC~~: NOT DETECTED
Cocaine Metabolite,Ur ~~LOC~~: POSITIVE — AB
MDMA (Ecstasy)Ur Screen: NOT DETECTED
Methadone Scn, Ur: NOT DETECTED
Opiate, Ur Screen: NOT DETECTED
Phencyclidine (PCP) Ur S: NOT DETECTED
Tricyclic, Ur Screen: NOT DETECTED

## 2023-05-16 LAB — BLOOD GAS, VENOUS
Acid-Base Excess: 0.5 mmol/L (ref 0.0–2.0)
Bicarbonate: 26 mmol/L (ref 20.0–28.0)
O2 Saturation: 98.7 %
Patient temperature: 37
pCO2, Ven: 44 mmHg (ref 44–60)
pH, Ven: 7.38 (ref 7.25–7.43)
pO2, Ven: 96 mmHg — ABNORMAL HIGH (ref 32–45)

## 2023-05-16 LAB — RESP PANEL BY RT-PCR (RSV, FLU A&B, COVID)  RVPGX2
Influenza A by PCR: NEGATIVE
Influenza B by PCR: NEGATIVE
Resp Syncytial Virus by PCR: NEGATIVE
SARS Coronavirus 2 by RT PCR: NEGATIVE

## 2023-05-16 LAB — TROPONIN I (HIGH SENSITIVITY)
Troponin I (High Sensitivity): 6 ng/L (ref <18)
Troponin I (High Sensitivity): 5 ng/L (ref <18)

## 2023-05-16 MED ORDER — THIAMINE MONONITRATE 100 MG PO TABS
100.0000 mg | ORAL_TABLET | Freq: Every day | ORAL | Status: DC
  Administered 2023-05-16 – 2023-05-17 (×2): 100 mg via ORAL
  Filled 2023-05-16 (×2): qty 1

## 2023-05-16 MED ORDER — ACETAMINOPHEN 650 MG RE SUPP: 650.0000 mg | Freq: Four times a day (QID) | RECTAL | Status: DC | PRN

## 2023-05-16 MED ORDER — AZITHROMYCIN 500 MG PO TABS
500.0000 mg | ORAL_TABLET | Freq: Every day | ORAL | Status: DC
  Administered 2023-05-16 – 2023-05-17 (×2): 500 mg via ORAL
  Filled 2023-05-16 (×2): qty 1

## 2023-05-16 MED ORDER — HYDROXYZINE HCL 50 MG PO TABS
25.0000 mg | ORAL_TABLET | Freq: Three times a day (TID) | ORAL | Status: DC | PRN
Start: 2023-05-16 — End: 2023-05-17

## 2023-05-16 MED ORDER — ADULT MULTIVITAMIN W/MINERALS CH
1.0000 | ORAL_TABLET | Freq: Every day | ORAL | Status: DC
  Administered 2023-05-16 – 2023-05-17 (×2): 1 via ORAL
  Filled 2023-05-16 (×2): qty 1

## 2023-05-16 MED ORDER — METHYLPREDNISOLONE SODIUM SUCC 125 MG IJ SOLR
125.0000 mg | Freq: Once | INTRAMUSCULAR | Status: AC
  Administered 2023-05-16: 125 mg via INTRAVENOUS
  Filled 2023-05-16: qty 2

## 2023-05-16 MED ORDER — ENOXAPARIN SODIUM 40 MG/0.4ML IJ SOSY
40.0000 mg | PREFILLED_SYRINGE | INTRAMUSCULAR | Status: DC
  Administered 2023-05-16: 40 mg via SUBCUTANEOUS
  Filled 2023-05-16: qty 0.4

## 2023-05-16 MED ORDER — POTASSIUM CHLORIDE CRYS ER 20 MEQ PO TBCR
40.0000 meq | EXTENDED_RELEASE_TABLET | Freq: Once | ORAL | Status: AC
  Administered 2023-05-16: 40 meq via ORAL
  Filled 2023-05-16: qty 2

## 2023-05-16 MED ORDER — LORAZEPAM 1 MG PO TABS
1.0000 mg | ORAL_TABLET | ORAL | Status: DC | PRN
  Administered 2023-05-16: 1 mg via ORAL
  Filled 2023-05-16: qty 1

## 2023-05-16 MED ORDER — LORAZEPAM 2 MG/ML IJ SOLN: 1.0000 mg | INTRAMUSCULAR | Status: DC | PRN

## 2023-05-16 MED ORDER — SENNOSIDES-DOCUSATE SODIUM 8.6-50 MG PO TABS: 1.0000 | ORAL_TABLET | Freq: Every evening | ORAL | Status: DC | PRN

## 2023-05-16 MED ORDER — PREDNISONE 50 MG PO TABS
60.0000 mg | ORAL_TABLET | Freq: Every day | ORAL | Status: DC
  Administered 2023-05-17: 60 mg via ORAL
  Filled 2023-05-16: qty 1

## 2023-05-16 MED ORDER — ACETAMINOPHEN 325 MG PO TABS: 650.0000 mg | ORAL_TABLET | Freq: Four times a day (QID) | ORAL | Status: DC | PRN

## 2023-05-16 MED ORDER — THIAMINE HCL 100 MG/ML IJ SOLN
100.0000 mg | Freq: Every day | INTRAMUSCULAR | Status: DC
Start: 2023-05-16 — End: 2023-05-17
  Filled 2023-05-16: qty 2

## 2023-05-16 MED ORDER — POTASSIUM CHLORIDE 10 MEQ/100ML IV SOLN
10.0000 meq | INTRAVENOUS | Status: AC
  Administered 2023-05-16 (×2): 10 meq via INTRAVENOUS
  Filled 2023-05-16: qty 100

## 2023-05-16 MED ORDER — FOLIC ACID 1 MG PO TABS
1.0000 mg | ORAL_TABLET | Freq: Every day | ORAL | Status: DC
  Administered 2023-05-16 – 2023-05-17 (×2): 1 mg via ORAL
  Filled 2023-05-16 (×2): qty 1

## 2023-05-16 MED ORDER — ONDANSETRON HCL 4 MG/2ML IJ SOLN: 4.0000 mg | Freq: Four times a day (QID) | INTRAMUSCULAR | Status: DC | PRN

## 2023-05-16 MED ORDER — FLUTICASONE FUROATE-VILANTEROL 100-25 MCG/ACT IN AEPB
1.0000 | INHALATION_SPRAY | Freq: Every day | RESPIRATORY_TRACT | Status: DC
  Administered 2023-05-16 – 2023-05-17 (×2): 1 via RESPIRATORY_TRACT
  Filled 2023-05-16: qty 28

## 2023-05-16 MED ORDER — ONDANSETRON HCL 4 MG PO TABS
4.0000 mg | ORAL_TABLET | Freq: Four times a day (QID) | ORAL | Status: DC | PRN
Start: 2023-05-16 — End: 2023-05-17

## 2023-05-16 MED ORDER — FLUOXETINE HCL 20 MG PO CAPS
30.0000 mg | ORAL_CAPSULE | Freq: Every day | ORAL | Status: DC
  Administered 2023-05-16 – 2023-05-17 (×2): 30 mg via ORAL
  Filled 2023-05-16 (×3): qty 1

## 2023-05-16 MED ORDER — IPRATROPIUM-ALBUTEROL 0.5-2.5 (3) MG/3ML IN SOLN: 3.0000 mL | RESPIRATORY_TRACT | Status: DC | PRN

## 2023-05-16 MED ORDER — IPRATROPIUM-ALBUTEROL 0.5-2.5 (3) MG/3ML IN SOLN
3.0000 mL | RESPIRATORY_TRACT | Status: AC
  Administered 2023-05-16 (×3): 3 mL via RESPIRATORY_TRACT
  Filled 2023-05-16: qty 6
  Filled 2023-05-16: qty 3

## 2023-05-16 NOTE — Progress Notes (Signed)
 Patient states [facility] will not admit him due to a pending court date. However, he has now confirmed he will be able to discharge to his sister's house in [place], [place]. Will provide with resources in this area.

## 2023-05-16 NOTE — Progress Notes (Signed)
 Introduced patient to role of Statistician. Intake questions completed.  Patient is HOMELESS and has been for about a year. Patient states he does not have transportation, but walks everywhere. Endorses both financial and food insecurity. States food is a "day to day" thing. 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   Patient endorses current substance abuse. Patient smokes "crack" cocaine daily and drinks about 12 beers a day.   Patient does admit to a history of depression, anxiety and SI. He denies any current SI or HI.   Patient states he is "hopeful" he will be able to go stay with his sister in [place], [place] . Although no concrete plans have been made for this.  Patient does NOT have a PCP. He is willing to establish one; however, he wants to "figure out where [he] is going first."  Patient interested in substance abuse treatment. Would consider [facility] residential treatment. Again wants to wait to complete the referral until he speaks with his sister.   Encouraged to reach out with questions or concerns. Will follow.

## 2023-05-16 NOTE — H&P (Addendum)
 History and Physical  Franklin [name]:[id number] DOB: 05/23/1970 DOA: 05/16/2023  PCP: Patient, No Pcp Per   Chief Complaint: Shortness of breath, wheezing  HPI: Franklin [name] is a 53 y.o. male with medical history significant for anxiety and depression, COPD, polysubstance use disorder (THC and cocaine) and tobacco use disorder who presents to the ED for evaluation of shortness of breath and wheezing.  Patient reports smoking crack 2 days ago.  Yesterday, he started having significant wheezing and shortness of breath with associated productive cough.  Thinks his crack was laced with something.  He also continues to smoke 16 cigarettes/day.  He has had multiple hospitalizations and ED visits over the last 2 months for COPD exacerbation in the setting of continuous crack and tobacco use.  He endorsed mild nausea but denies any vomiting, chest pain, abdominal pain, fevers, chills or headache.  ED Course: Initial vitals showed temp 97.5, RR 15, HR 109, BP 148/96, SpO2 100% on 2 L, placed on BiPAP briefly with FiO2 of 28% with improvement in symptoms.  Patient currently on room air with SpO2 97 to 100%.  Labs significant for hypokalemia with K+ of 3.2 otherwise normal kidney function, VBG and CBC. UDS positive for amphetamines and cocaine.  EKG shows sinus tach with PVCs. CXR with no acute cardiopulmonary disease.  Patient received IV Solu-Medrol  125 mg x 1, DuoNeb and IV KCl 10 mEq x 2 doses.  TRH was consulted for admission.  Review of Systems: Please see HPI for pertinent positives and negatives. A complete 10 system review of systems are otherwise negative.  Past Medical History:  Diagnosis Date   A-fib (HCC)    Anxiety    COPD (chronic obstructive pulmonary disease) (HCC)    Depression    Substance abuse (HCC)    Past Surgical History:  Procedure Laterality Date   NO PAST SURGERIES     Social History:  reports that he has been smoking cigarettes. He has never used smokeless tobacco. He  reports current alcohol  use of about 12.0 standard drinks of alcohol  per week. He reports current drug use. Drugs: "Crack" cocaine and Cocaine.  No Known Allergies  Family History  Problem Relation Age of Onset   Hypertension Other    Diabetes Other      Prior to Admission medications   Medication Sig Start Date End Date Taking? Authorizing Provider  albuterol  (VENTOLIN  HFA) 108 (90 Base) MCG/ACT inhaler Inhale 2 puffs into the lungs every 6 (six) hours as needed for wheezing or shortness of breath. 05/07/23   Arrien, Curlee Doss, MD  FLUoxetine  (PROZAC ) 10 MG capsule Take 3 capsules (30 mg total) by mouth daily. 05/08/23   Arrien, Curlee Doss, MD  ipratropium-albuterol  (DUONEB) 0.5-2.5 (3) MG/3ML SOLN Take 3 mLs by nebulization every 6 (six) hours as needed. 05/07/23   Arrien, Curlee Doss, MD  predniSONE  (DELTASONE ) 10 MG tablet Take 6 tablets (60 mg total) by mouth daily for 3 days, THEN 4 tablets (40 mg total) daily for 3 days, THEN 2 tablets (20 mg total) daily for 3 days, THEN 1 tablet (10 mg total) daily for 3 days. 05/09/23 05/21/23  Ninetta Basket, MD    Physical Exam: BP 137/69   Pulse (!) 102   Temp (!) 97.5 F (36.4 C)   Resp 20   SpO2 97%  General: Pleasant, well-appearing middle-age man laying in bed. No acute distress. HEENT: [place]/AT. Anicteric sclera CV: Tachycardic. Irregular rhythm. No murmurs, rubs, or gallops. No LE edema Pulmonary:  Lungs CTAB. Normal effort.  Mild expiratory wheezes.  No rales. Abdominal: Soft, nontender, nondistended. Normal bowel sounds. Extremities: Palpable radial and DP pulses. Normal ROM. Skin: Warm and dry. No obvious rash or lesions. Neuro: A&Ox3. Moves all extremities. Normal sensation to light touch. No focal deficit. Psych: Normal mood and affect          Labs on Admission:  Basic Metabolic Panel: Recent Labs  Lab 05/09/23 1003 05/16/23 0145  NA 139 139  K 4.1 3.2*  CL 102 102  CO2 26 24  GLUCOSE 191* 125*  BUN 15 7   CREATININE 1.08 1.08  CALCIUM 9.1 9.1   Liver Function Tests: Recent Labs  Lab 05/09/23 1003  AST 30  ALT 24  ALKPHOS 57  BILITOT 1.1  PROT 6.7  ALBUMIN 3.8   No results for input(s): "LIPASE", "AMYLASE" in the last 168 hours. No results for input(s): "AMMONIA" in the last 168 hours. CBC: Recent Labs  Lab 05/09/23 1003 05/16/23 0145  WBC 10.4 8.2  NEUTROABS  --  5.2  HGB 15.5 14.3  HCT 47.1 43.8  MCV 87.9 90.1  PLT 369 348   Cardiac Enzymes: No results for input(s): "CKTOTAL", "CKMB", "CKMBINDEX", "TROPONINI" in the last 168 hours. BNP (last 3 results) Recent Labs    06/18/22 2226 06/30/22 2311 04/29/23 0156  BNP 23.4 47.7 21.2    ProBNP (last 3 results) No results for input(s): "PROBNP" in the last 8760 hours.  CBG: No results for input(s): "GLUCAP" in the last 168 hours.  Radiological Exams on Admission: DG Chest Portable 1 View Result Date: 05/16/2023 CLINICAL DATA:  Initial evaluation for acute shortness of breath. EXAM: PORTABLE CHEST 1 VIEW COMPARISON:  Prior radiograph from 05/09/2023 FINDINGS: Transverse heart size within normal limits. Mediastinal silhouette normal. Lungs are mildly hyperinflated. No pulmonary edema or pleural effusion. No focal infiltrates or consolidative airspace disease. No pneumothorax. Visualized soft tissues and osseous structures demonstrate no acute finding. IMPRESSION: Hyperinflation.  No other active cardiopulmonary disease. Electronically Signed   By: Virgia Griffins M.D.   On: 05/16/2023 03:33   Assessment/Plan Franklin [name] is a 53 y.o. male with medical history significant for anxiety and depression, COPD, polysubstance use disorder (THC and cocaine) and tobacco use disorder who presents to the ED for evaluation of shortness of breath and wheezing and admitted for COPD exacerbation.  # COPD with acute exacerbation - History of COPD with continuous use of tobacco and polysubstance use including cocaine and THC. -  Found to have significant wheezing and SOB requiring brief BiPAP therapy. - COPD exacerbation due to persistent tobacco use and cocaine use, has had multiple ED visits and hospitalizations for the last few months due to this. - Wheezing and SOB significantly improved, patient now on room air. - Prednisone  60 mg daily, consider taper at discharge - Breo Ellipta  and PRN DuoNebs - Azithromycin  500 mg daily - Incentive spirometer, flutter valve  # Hypokalemia - K+ of 3.2 on admission s/p IV KCl 10 mEq x 2 doses - Give KCl 40 mEq x 1 - Follow-up morning BMP and mag  # Alcohol  use disorder # EtOH withdrawal, early - Patient found to be anxious and diaphoretic by RN after admission, CIWA score of 7 - Reports drinking 12 beers a day - Start CIWA with Ativan  - Multivitamin, folate and thiamine  - TOC consulted for substance use resources  # Tobacco use disorder - Reports smoking about 16 cigarettes/day - Smoking cessation counseling for 4 minutes today,  considered nicotine  patch but patient declined  I have discussed tobacco cessation with the patient.  I have counseled the patient regarding the negative impacts of continued tobacco use including but not limited to lung cancer, COPD, and cardiovascular disease.  I have discussed alternatives to tobacco and modalities that may help facilitate tobacco cessation including but not limited to biofeedback, hypnosis, and medications.  Total time spent with tobacco counseling was 4 minutes.  # Polysubstance use disorder - History of smoking crack cocaine and marijuana - Admits to crack cocaine use 2 days ago - UDS positive for amphetamine and cocaine - TOC consulted for substance use disorder resources   # Anxiety and depression - Continue fluoxetine  - PRN Atarax  as needed for anxiety    DVT prophylaxis: Lovenox      Code Status: Full Code  Consults called: None  Family Communication: No family at bedside  Severity of Illness: The  appropriate patient status for this patient is OBSERVATION. Observation status is judged to be reasonable and necessary in order to provide the required intensity of service to ensure the patient's safety. The patient's presenting symptoms, physical exam findings, and initial radiographic and laboratory data in the context of their medical condition is felt to place them at decreased risk for further clinical deterioration. Furthermore, it is anticipated that the patient will be medically stable for discharge from the hospital within 2 midnights of admission.   Level of care: Telemetry Medical   This record has been created using Conservation officer, historic buildings. Errors have been sought and corrected, but may not always be located. Such creation errors do not reflect on the standard of care.   Vita Grip, MD 05/16/2023, 8:30 AM [facility] Pager: [phone] Isaiah 41:10   If 7PM-7AM, please contact night-coverage [email or web address] Password [email or web address]

## 2023-05-16 NOTE — ED Triage Notes (Addendum)
 POV with CC of SOB x2 hrs. Accessory muscle use. SpO2 98% on room air. Placed on 2L [place] for comfort. Resp called and MD in room.

## 2023-05-16 NOTE — Discharge Instructions (Signed)
 Your nurse navigator, Cienna Dumais, can be reached at [phone]   [facility] Residential Treatment: [phone] [address] [place], [place] [address]

## 2023-05-16 NOTE — Plan of Care (Signed)
 Alert and oriented x4. No complaints of pain. On ABT for COPD exacerbation. No respiratory distress noted. Resp even and unlabored. Appetite good, with 75-100% meal consumption. No skin breakdown. Has remained free from injury this shift. Problem: Education: Goal: Knowledge of General Education information will improve Description: Including pain rating scale, medication(s)/side effects and non-pharmacologic comfort measures Outcome: Progressing   Problem: Health Behavior/Discharge Planning: Goal: Ability to manage health-related needs will improve Outcome: Progressing   Problem: Clinical Measurements: Goal: Ability to maintain clinical measurements within normal limits will improve Outcome: Progressing Goal: Will remain free from infection Outcome: Progressing Goal: Diagnostic test results will improve Outcome: Progressing Goal: Respiratory complications will improve Outcome: Progressing Goal: Cardiovascular complication will be avoided Outcome: Progressing   Problem: Activity: Goal: Risk for activity intolerance will decrease Outcome: Progressing   Problem: Nutrition: Goal: Adequate nutrition will be maintained Outcome: Progressing   Problem: Coping: Goal: Level of anxiety will decrease Outcome: Progressing   Problem: Elimination: Goal: Will not experience complications related to bowel motility Outcome: Progressing Goal: Will not experience complications related to urinary retention Outcome: Progressing   Problem: Pain Managment: Goal: General experience of comfort will improve and/or be controlled Outcome: Progressing   Problem: Safety: Goal: Ability to remain free from injury will improve Outcome: Progressing   Problem: Skin Integrity: Goal: Risk for impaired skin integrity will decrease Outcome: Progressing   Problem: Education: Goal: Knowledge of disease or condition will improve Outcome: Progressing Goal: Knowledge of the prescribed therapeutic regimen  will improve Outcome: Progressing Goal: Individualized Educational Video(s) Outcome: Progressing   Problem: Activity: Goal: Ability to tolerate increased activity will improve Outcome: Progressing Goal: Will verbalize the importance of balancing activity with adequate rest periods Outcome: Progressing   Problem: Respiratory: Goal: Ability to maintain a clear airway will improve Outcome: Progressing Goal: Levels of oxygenation will improve Outcome: Progressing Goal: Ability to maintain adequate ventilation will improve Outcome: Progressing

## 2023-05-16 NOTE — ED Provider Notes (Signed)
 [facility] Provider Note    Event Date/Time   First MD Initiated Contact with Patient 05/16/23 [phone]     (approximate)   History   Shortness of Breath   HPI  Franklin [name] is a 53 y.o. male with history of COPD, atrial fibrillation, substance use disorder who presents to the emergency department complaints of shortness of breath, wheezing that started several hours ago.  No fevers, productive cough.  Feels like his chest is tight.  No lower extremity swelling or pain.  Does not wear oxygen chronically.   History provided by patient.    Past Medical History:  Diagnosis Date   A-fib (HCC)    Anxiety    COPD (chronic obstructive pulmonary disease) (HCC)    Depression    Substance abuse (HCC)     Past Surgical History:  Procedure Laterality Date   NO PAST SURGERIES      MEDICATIONS:  Prior to Admission medications   Medication Sig Start Date End Date Taking? Authorizing Provider  albuterol  (VENTOLIN  HFA) 108 (90 Base) MCG/ACT inhaler Inhale 2 puffs into the lungs every 6 (six) hours as needed for wheezing or shortness of breath. 05/07/23   Arrien, Curlee Doss, MD  FLUoxetine  (PROZAC ) 10 MG capsule Take 3 capsules (30 mg total) by mouth daily. 05/08/23   Arrien, Curlee Doss, MD  ipratropium-albuterol  (DUONEB) 0.5-2.5 (3) MG/3ML SOLN Take 3 mLs by nebulization every 6 (six) hours as needed. 05/07/23   Arrien, Curlee Doss, MD  predniSONE  (DELTASONE ) 10 MG tablet Take 6 tablets (60 mg total) by mouth daily for 3 days, THEN 4 tablets (40 mg total) daily for 3 days, THEN 2 tablets (20 mg total) daily for 3 days, THEN 1 tablet (10 mg total) daily for 3 days. 05/09/23 05/21/23  Ninetta Basket, MD    Physical Exam   Triage Vital Signs: ED Triage Vitals [05/16/23 0145]  Encounter Vitals Group     BP      Systolic BP Percentile      Diastolic BP Percentile      Pulse Rate (!) 108     Resp 20     Temp      Temp src      SpO2 98 %     Weight       Height      Head Circumference      Peak Flow      Pain Score      Pain Loc      Pain Education      Exclude from Growth Chart     Most recent vital signs: Vitals:   05/16/23 0149 05/16/23 0245  BP: (!) 148/96 136/73  Pulse: (!) 109 (!) 101  Resp: 15 13  Temp: (!) 97.5 F (36.4 C)   SpO2: 100% 100%    CONSTITUTIONAL: Alert, responds appropriately to questions.  Chronically ill-appearing, in respiratory distress HEAD: Normocephalic, atraumatic EYES: Conjunctivae clear, pupils appear equal, sclera nonicteric ENT: normal nose; moist mucous membranes NECK: Supple, normal ROM CARD: Regular and tachycardic; S1 and S2 appreciated RESP: In respiratory distress.  Increased work of breathing.  No hypoxia.  Diffuse expiratory wheezes, very diminished aeration.  No rhonchi or rales.  Speaking in truncated sentences. ABD/GI: Non-distended; soft, non-tender, no rebound, no guarding, no peritoneal signs BACK: The back appears normal EXT: Normal ROM in all joints; no deformity noted, no edema, no calf tenderness or calf swelling SKIN: Normal color for age and race; warm;  no rash on exposed skin NEURO: Moves all extremities equally, normal speech PSYCH: The patient's mood and manner are appropriate.   ED Results / Procedures / Treatments   LABS: (all labs ordered are listed, but only abnormal results are displayed) Labs Reviewed  BASIC METABOLIC PANEL WITH GFR - Abnormal; Notable for the following components:      Result Value   Potassium 3.2 (*)    Glucose, Bld 125 (*)    All other components within normal limits  BLOOD GAS, VENOUS - Abnormal; Notable for the following components:   pO2, Ven 96 (*)    All other components within normal limits  RESP PANEL BY RT-PCR (RSV, FLU A&B, COVID)  [id number]  CBC WITH DIFFERENTIAL/PLATELET  URINE DRUG SCREEN, QUALITATIVE ([facility] ONLY)  TROPONIN I (HIGH SENSITIVITY)  TROPONIN I (HIGH SENSITIVITY)     EKG:  EKG  Interpretation Date/Time:  Monday May 16 2023 01:46:56 EDT Ventricular Rate:  109 PR Interval:  133 QRS Duration:  100 QT Interval:  332 QTC Calculation: 447 R Axis:   41  Text Interpretation: Sinus tachycardia Ventricular premature complex Nonspecific repol abnormality, lateral leads Confirmed by Verneda Golder [phone]) on 05/16/2023 2:13:09 AM         RADIOLOGY: My personal review and interpretation of imaging: Chest x-ray clear.  I have personally reviewed all radiology reports.   DG Chest Portable 1 View Result Date: 05/16/2023 CLINICAL DATA:  Initial evaluation for acute shortness of breath. EXAM: PORTABLE CHEST 1 VIEW COMPARISON:  Prior radiograph from 05/09/2023 FINDINGS: Transverse heart size within normal limits. Mediastinal silhouette normal. Lungs are mildly hyperinflated. No pulmonary edema or pleural effusion. No focal infiltrates or consolidative airspace disease. No pneumothorax. Visualized soft tissues and osseous structures demonstrate no acute finding. IMPRESSION: Hyperinflation.  No other active cardiopulmonary disease. Electronically Signed   By: Virgia Griffins M.D.   On: 05/16/2023 03:33     PROCEDURES:  Critical Care performed: Yes, see critical care procedure note(s)   CRITICAL CARE Performed by: Starling Eck Jamie Belger   Total critical care time: 35 minutes  Critical care time was exclusive of separately billable procedures and treating other patients.  Critical care was necessary to treat or prevent imminent or life-threatening deterioration.  Critical care was time spent personally by me on the following activities: development of treatment plan with patient and/or surrogate as well as nursing, discussions with consultants, evaluation of patient's response to treatment, examination of patient, obtaining history from patient or surrogate, ordering and performing treatments and interventions, ordering and review of laboratory studies, ordering and review of  radiographic studies, pulse oximetry and re-evaluation of patient's condition.   Aaron Aas1-3 Lead EKG Interpretation  Performed by: Sherin Murdoch, Clover Dao, DO Authorized by: Jeilyn Reznik, Clover Dao, DO     Interpretation: abnormal     ECG rate:  109   ECG rate assessment: tachycardic     Rhythm: sinus tachycardia     Ectopy: none     Conduction: normal       IMPRESSION / MDM / ASSESSMENT AND PLAN / ED COURSE  I reviewed the triage vital signs and the nursing notes.    Patient here with shortness of breath, wheezing.  In respiratory distress.  The patient is on the cardiac monitor to evaluate for evidence of arrhythmia and/or significant heart rate changes.   DIFFERENTIAL DIAGNOSIS (includes but not limited to):   COPD exacerbation, pneumonia, pneumothorax, PE, ACS, viral URI, CHF   Patient's presentation is most consistent with acute presentation  with potential threat to life or bodily function.   PLAN: Will obtain labs, COVID and flu swab, chest x-ray.  Will give breathing treatments, IV steroids.  Will place him on BiPAP due to respiratory distress and increased work of breathing.  Anticipate admission.   MEDICATIONS GIVEN IN ED: Medications  ipratropium-albuterol  (DUONEB) 0.5-2.5 (3) MG/3ML nebulizer solution 3 mL (3 mLs Nebulization Given 05/16/23 0331)  potassium chloride  10 mEq in 100 mL IVPB (10 mEq Intravenous New Bag/Given 05/16/23 0336)  methylPREDNISolone  sodium succinate (SOLU-MEDROL ) 125 mg/2 mL injection 125 mg (125 mg Intravenous Given 05/16/23 0220)     ED COURSE: VBG reassuring.  Normal hemoglobin.  Potassium of 3.2.  Will give replacement.  Troponin negative.  Chest x-ray reviewed and interpreted by myself and the radiologist is clear.  COVID, flu and RSV negative.  Patient reports feeling better now able to speak full sentences.  Still has some diminished aeration but wheezing has improved.  Was able to transition him off BiPAP.  Patient is agreeable to admission for observation  for COPD exacerbation.  Will discuss with the hospitalist.   CONSULTS:  Consulted and discussed patient's case with hospitalist, Dr. Vallarie Gauze.  I have recommended admission and consulting physician agrees and will place admission orders.  Patient (and family if present) agree with this plan.   I reviewed all nursing notes, vitals, pertinent previous records.  All labs, EKGs, imaging ordered have been independently reviewed and interpreted by myself.    OUTSIDE RECORDS REVIEWED: Reviewed last echo on 06/20/2022 which showed EF of 50 to 55%, left ventricle had low normal function.       FINAL CLINICAL IMPRESSION(S) / ED DIAGNOSES   Final diagnoses:  COPD exacerbation (HCC)     Rx / DC Orders   ED Discharge Orders     None        Note:  This document was prepared using Dragon voice recognition software and may include unintentional dictation errors.   Daleena Rotter, Clover Dao, DO 05/16/23 [phone]

## 2023-05-17 ENCOUNTER — Other Ambulatory Visit: Payer: Self-pay

## 2023-05-17 DIAGNOSIS — J441 Chronic obstructive pulmonary disease with (acute) exacerbation: Secondary | ICD-10-CM | POA: Diagnosis not present

## 2023-05-17 LAB — CBC
HCT: 38.5 % — ABNORMAL LOW (ref 39.0–52.0)
Hemoglobin: 12.6 g/dL — ABNORMAL LOW (ref 13.0–17.0)
MCH: 29.3 pg (ref 26.0–34.0)
MCHC: 32.7 g/dL (ref 30.0–36.0)
MCV: 89.5 fL (ref 80.0–100.0)
Platelets: 311 10*3/uL (ref 150–400)
RBC: 4.3 MIL/uL (ref 4.22–5.81)
RDW: 15.3 % (ref 11.5–15.5)
WBC: 10.4 10*3/uL (ref 4.0–10.5)
nRBC: 0 % (ref 0.0–0.2)

## 2023-05-17 LAB — BASIC METABOLIC PANEL WITH GFR
Anion gap: 7 (ref 5–15)
BUN: 12 mg/dL (ref 6–20)
CO2: 25 mmol/L (ref 22–32)
Calcium: 8.6 mg/dL — ABNORMAL LOW (ref 8.9–10.3)
Chloride: 104 mmol/L (ref 98–111)
Creatinine, Ser: 0.92 mg/dL (ref 0.61–1.24)
GFR, Estimated: >60 mL/min (ref >60)
Glucose, Bld: 124 mg/dL — ABNORMAL HIGH (ref 70–99)
Potassium: 3.7 mmol/L (ref 3.5–5.1)
Sodium: 136 mmol/L (ref 135–145)

## 2023-05-17 LAB — MAGNESIUM: Magnesium: 2.1 mg/dL (ref 1.7–2.4)

## 2023-05-17 MED ORDER — PREDNISONE 20 MG PO TABS
40.0000 mg | ORAL_TABLET | Freq: Every day | ORAL | 0 refills | Status: AC
  Filled 2023-05-17: qty 6, 3d supply, fill #0

## 2023-05-17 MED ORDER — FLUTICASONE FUROATE-VILANTEROL 100-25 MCG/ACT IN AEPB
1.0000 | INHALATION_SPRAY | Freq: Every day | RESPIRATORY_TRACT | 0 refills | Status: DC
  Filled 2023-05-17: qty 60, 30d supply, fill #0

## 2023-05-17 MED ORDER — ALBUTEROL SULFATE HFA 108 (90 BASE) MCG/ACT IN AERS
2.0000 | INHALATION_SPRAY | Freq: Four times a day (QID) | RESPIRATORY_TRACT | 0 refills | Status: DC | PRN
  Filled 2023-05-17: qty 8, fill #0

## 2023-05-17 MED ORDER — IPRATROPIUM-ALBUTEROL 0.5-2.5 (3) MG/3ML IN SOLN
3.0000 mL | Freq: Four times a day (QID) | RESPIRATORY_TRACT | 0 refills | Status: AC | PRN
  Filled 2023-05-17: qty 360, 30d supply, fill #0

## 2023-05-17 NOTE — Plan of Care (Signed)

## 2023-05-17 NOTE — Discharge Summary (Signed)
 Physician Discharge Summary   Patient: Franklin [name] MRN: [id number] DOB: Dec 01, 1970  Admit date:     05/16/2023  Discharge date: 05/17/23  Discharge Physician: Sheril Dines   PCP: Patient, No Pcp Per   Recommendations at discharge:    Follow up with PCP in 2 weeks  Discharge Diagnoses: Principal Problem:   COPD exacerbation (HCC) Active Problems:   Tobacco use disorder   Hypokalemia  Resolved Problems:   * No resolved hospital problems. *  Hospital Course:  Jerri Vanfossen is a 53 y.o. male with medical history significant for anxiety and depression, COPD, polysubstance use disorder (THC and cocaine) and tobacco use disorder who presents to the ED for evaluation of shortness of breath and wheezing.  Patient reports smoking crack 2 days ago.  Yesterday, he started having significant wheezing and shortness of breath with associated productive cough.  Thinks his crack was laced with something.  He also continues to smoke 16 cigarettes/day.  He has had multiple hospitalizations and ED visits over the last 2 months for COPD exacerbation in the setting of continuous crack and tobacco use     Assessment and Plan:   COPD exacerbation: Improved.  He will be discharged on prednisone .  Continue bronchodilators at discharge.  He requested a new nebulizer machine because he said he is nebulizer is dysfunctional. Nebulizer and DuoNeb have been prescribed.   Hypokalemia: Improved   Alcohol  use disorder, tobacco use disorder, cocaine use disorder: Counseled to quit.   Anxiety, depression,: Continue fluoxetine        Consultants: None Procedures performed: None Disposition: Home Diet recommendation:  Discharge Diet Orders (From admission, onward)     Start     Ordered   05/17/23 0000  Diet - low sodium heart healthy        05/17/23 1039           Cardiac diet DISCHARGE MEDICATION: Allergies as of 05/17/2023   No Known Allergies      Medication List     TAKE these  medications    albuterol  108 (90 Base) MCG/ACT inhaler Commonly known as: VENTOLIN  HFA Inhale 2 puffs into the lungs every 6 (six) hours as needed for wheezing or shortness of breath.   FLUoxetine  10 MG capsule Commonly known as: PROZAC  Take 3 capsules (30 mg total) by mouth daily.   fluticasone  furoate-vilanterol 100-25 MCG/ACT Aepb Commonly known as: BREO ELLIPTA  Inhale 1 puff into the lungs daily. Start taking on: May 18, 2023   ipratropium-albuterol  0.5-2.5 (3) MG/3ML Soln Commonly known as: DUONEB Take 3 mLs by nebulization every 6 (six) hours as needed.   predniSONE  20 MG tablet Commonly known as: DELTASONE  Take 2 tablets (40 mg total) by mouth daily with breakfast for 3 days. Start taking on: May 18, 2023 What changed:  medication strength See the new instructions.               Durable Medical Equipment  (From admission, onward)           Start     Ordered   05/17/23 0000  For home use only DME Nebulizer machine       Question Answer Comment  Patient needs a nebulizer to treat with the following condition COPD exacerbation (HCC)   Length of Need Lifetime   Additional equipment included Administration kit      05/17/23 1039            Discharge Exam:  GEN: NAD SKIN: Chronic erythematous maculopapular rash on  his back EYES: No pallor or icterus ENT: MMM CV: RRR PULM: CTA B ABD: soft, ND, NT, +BS CNS: AAO x 3, non focal EXT: No edema or tenderness   Condition at discharge: good  The results of significant diagnostics from this hospitalization (including imaging, microbiology, ancillary and laboratory) are listed below for reference.   Imaging Studies: DG Chest Portable 1 View Result Date: 05/16/2023 CLINICAL DATA:  Initial evaluation for acute shortness of breath. EXAM: PORTABLE CHEST 1 VIEW COMPARISON:  Prior radiograph from 05/09/2023 FINDINGS: Transverse heart size within normal limits. Mediastinal silhouette normal. Lungs are mildly  hyperinflated. No pulmonary edema or pleural effusion. No focal infiltrates or consolidative airspace disease. No pneumothorax. Visualized soft tissues and osseous structures demonstrate no acute finding. IMPRESSION: Hyperinflation.  No other active cardiopulmonary disease. Electronically Signed   By: Virgia Griffins M.D.   On: 05/16/2023 03:33   DG Chest 2 View Result Date: 05/09/2023 CLINICAL DATA:  Shortness of breath. EXAM: CHEST - 2 VIEW COMPARISON:  05/04/2023. FINDINGS: The heart size and mediastinal contours are within normal limits. No focal consolidation, pleural effusion, or pneumothorax. No acute osseous abnormality. IMPRESSION: No acute cardiopulmonary findings. Electronically Signed   By: Mannie Seek M.D.   On: 05/09/2023 10:10   DG Chest Portable 1 View Result Date: 05/04/2023 CLINICAL DATA:  Cough, short of breath for 2-3 days EXAM: PORTABLE CHEST 1 VIEW COMPARISON:  04/29/2023 FINDINGS: 2 frontal views of the chest demonstrate an unremarkable cardiac silhouette. No acute airspace disease, effusion, or pneumothorax. No acute bony abnormalities. IMPRESSION: 1. No acute intrathoracic process. Electronically Signed   By: Bobbye Burrow M.D.   On: 05/04/2023 20:43   DG Chest Port 1 View Result Date: 04/29/2023 CLINICAL DATA:  Shortness of breath. EXAM: PORTABLE CHEST 1 VIEW COMPARISON:  February 27, 2023 FINDINGS: The heart size and mediastinal contours are within normal limits. Both lungs are clear. The visualized skeletal structures are unremarkable. IMPRESSION: No active disease. Electronically Signed   By: Virgle Grime M.D.   On: 04/29/2023 02:29    Microbiology: Results for orders placed or performed during the hospital encounter of 05/16/23  Resp panel by RT-PCR (RSV, Flu A&B, Covid) Anterior Nasal Swab     Status: None   Collection Time: 05/16/23  2:30 AM   Specimen: Anterior Nasal Swab  Result Value Ref Range Status   SARS Coronavirus 2 by RT PCR NEGATIVE NEGATIVE  Final    Comment: (NOTE) SARS-CoV-2 target nucleic acids are NOT DETECTED.  The SARS-CoV-2 RNA is generally detectable in upper respiratory specimens during the acute phase of infection. The lowest concentration of SARS-CoV-2 viral copies this assay can detect is 138 copies/mL. A negative result does not preclude SARS-Cov-2 infection and should not be used as the sole basis for treatment or other patient management decisions. A negative result may occur with  improper specimen collection/handling, submission of specimen other than nasopharyngeal swab, presence of viral mutation(s) within the areas targeted by this assay, and inadequate number of viral copies(<138 copies/mL). A negative result must be combined with clinical observations, patient history, and epidemiological information. The expected result is Negative.  Fact Sheet for Patients:  BloggerCourse.com  Fact Sheet for Healthcare Providers:  SeriousBroker.it  This test is no t yet approved or cleared by the [place]  FDA and  has been authorized for detection and/or diagnosis of SARS-CoV-2 by FDA under an Emergency Use Authorization (EUA). This EUA will remain  in effect (meaning this test can be used)  for the duration of the COVID-19 declaration under Section 564(b)(1) of the Act, 21 U.S.C.section 360bbb-3(b)(1), unless the authorization is terminated  or revoked sooner.       Influenza A by PCR NEGATIVE NEGATIVE Final   Influenza B by PCR NEGATIVE NEGATIVE Final    Comment: (NOTE) The Xpert Xpress SARS-CoV-2/FLU/RSV plus assay is intended as an aid in the diagnosis of influenza from Nasopharyngeal swab specimens and should not be used as a sole basis for treatment. Nasal washings and aspirates are unacceptable for Xpert Xpress SARS-CoV-2/FLU/RSV testing.  Fact Sheet for Patients: BloggerCourse.com  Fact Sheet for Healthcare  Providers: SeriousBroker.it  This test is not yet approved or cleared by the [place]  FDA and has been authorized for detection and/or diagnosis of SARS-CoV-2 by FDA under an Emergency Use Authorization (EUA). This EUA will remain in effect (meaning this test can be used) for the duration of the COVID-19 declaration under Section 564(b)(1) of the Act, 21 U.S.C. section 360bbb-3(b)(1), unless the authorization is terminated or revoked.     Resp Syncytial Virus by PCR NEGATIVE NEGATIVE Final    Comment: (NOTE) Fact Sheet for Patients: BloggerCourse.com  Fact Sheet for Healthcare Providers: SeriousBroker.it  This test is not yet approved or cleared by the [place]  FDA and has been authorized for detection and/or diagnosis of SARS-CoV-2 by FDA under an Emergency Use Authorization (EUA). This EUA will remain in effect (meaning this test can be used) for the duration of the COVID-19 declaration under Section 564(b)(1) of the Act, 21 U.S.C. section 360bbb-3(b)(1), unless the authorization is terminated or revoked.  Performed at [facility], [address] [place], [place] [address]     Labs: CBC: Recent Labs  Lab 05/16/23 0145 05/17/23 0420  WBC 8.2 10.4  NEUTROABS 5.2  --   HGB 14.3 12.6*  HCT 43.8 38.5*  MCV 90.1 89.5  PLT 348 311   Basic Metabolic Panel: Recent Labs  Lab 05/16/23 0145 05/17/23 0420  NA 139 136  K 3.2* 3.7  CL 102 104  CO2 24 25  GLUCOSE 125* 124*  BUN 7 12  CREATININE 1.08 0.92  CALCIUM 9.1 8.6*  MG  --  2.1   Liver Function Tests: No results for input(s): "AST", "ALT", "ALKPHOS", "BILITOT", "PROT", "ALBUMIN" in the last 168 hours. CBG: No results for input(s): "GLUCAP" in the last 168 hours.  Discharge time spent: greater than 30 minutes.  Signed: Sheril Dines, MD [facility] 05/17/2023

## 2023-05-17 NOTE — TOC Transition Note (Addendum)
 Transition of Care [facility]) - Discharge Note   Patient Details  Name: Franklin [name] MRN: [id number] Date of Birth: 1970-12-22  [facility] CM/SW Contact:  Crayton Docker, RN 05/17/2023, 12:31 PM   Clinical Narrative:     Discharge orders for home/self care. Order noted for nebulizer. CM secure message to Sam Creighton, [employer] regarding order for nebulizer for room delivery.   Secure message received from Sam Creighton, order for nebulizer sent for processing.  Secure message from [place], [employer], nebulizer delivered.  Final next level of care: Home/Self Care Barriers to Discharge: No Barriers Identified   Patient Goals and CMS Choice      Home/self care   Discharge Placement    Home/self care            Discharge Plan and Services Additional resources added to the After Visit Summary for               DME Arranged: Nebulizer/meds DME Agency: [employer] Date DME Agency Contacted: 05/17/23 Time DME Agency Contacted: 53 Representative spoke with at DME Agency: Sam Creighton   Social Drivers of Health (SDOH) Interventions SDOH Screenings   Food Insecurity: Food Insecurity Present (05/16/2023)  Housing: High Risk (05/16/2023)  Transportation Needs: Unmet Transportation Needs (05/16/2023)  Utilities: Not At Risk (05/16/2023)  Alcohol  Screen: High Risk (02/27/2023)  Depression (PHQ2-9): Low Risk  (12/09/2022)  Recent Concern: Depression (PHQ2-9) - Medium Risk (12/06/2022)  Social Connections: Unknown (05/16/2023)  Tobacco Use: High Risk (05/16/2023)     Readmission Risk Interventions     No data to display

## 2023-05-17 NOTE — Plan of Care (Signed)

## 2023-06-07 ENCOUNTER — Emergency Department: Payer: MEDICAID

## 2023-06-07 ENCOUNTER — Emergency Department
Admission: EM | Admit: 2023-06-07 | Discharge: 2023-06-08 | Disposition: A | Discharge status: Other Acute Inpatient Hospital | Payer: MEDICAID | Attending: Emergency Medicine | Admitting: Emergency Medicine

## 2023-06-07 DIAGNOSIS — F332 Major depressive disorder, recurrent severe without psychotic features: Secondary | ICD-10-CM | POA: Diagnosis not present

## 2023-06-07 DIAGNOSIS — F14221 Cocaine dependence with intoxication delirium: Secondary | ICD-10-CM | POA: Insufficient documentation

## 2023-06-07 DIAGNOSIS — R4182 Altered mental status, unspecified: Secondary | ICD-10-CM | POA: Diagnosis present

## 2023-06-07 DIAGNOSIS — F1910 Other psychoactive substance abuse, uncomplicated: Secondary | ICD-10-CM | POA: Diagnosis not present

## 2023-06-07 DIAGNOSIS — F14921 Cocaine use, unspecified with intoxication delirium: Secondary | ICD-10-CM

## 2023-06-07 LAB — CBC WITH DIFFERENTIAL/PLATELET
Abs Immature Granulocytes: 0.03 10*3/uL (ref 0.00–0.07)
Basophils Absolute: 0 10*3/uL (ref 0.0–0.1)
Basophils Relative: 1 %
Eosinophils Absolute: 0.1 10*3/uL (ref 0.0–0.5)
Eosinophils Relative: 2 %
HCT: 35.9 % — ABNORMAL LOW (ref 39.0–52.0)
Hemoglobin: 11.8 g/dL — ABNORMAL LOW (ref 13.0–17.0)
Immature Granulocytes: 0 %
Lymphocytes Relative: 22 %
Lymphs Abs: 1.5 10*3/uL (ref 0.7–4.0)
MCH: 28.7 pg (ref 26.0–34.0)
MCHC: 32.9 g/dL (ref 30.0–36.0)
MCV: 87.3 fL (ref 80.0–100.0)
Monocytes Absolute: 0.6 10*3/uL (ref 0.1–1.0)
Monocytes Relative: 9 %
Neutro Abs: 4.6 10*3/uL (ref 1.7–7.7)
Neutrophils Relative %: 66 %
Platelets: 249 10*3/uL (ref 150–400)
RBC: 4.11 MIL/uL — ABNORMAL LOW (ref 4.22–5.81)
RDW: 15.1 % (ref 11.5–15.5)
WBC: 6.9 10*3/uL (ref 4.0–10.5)
nRBC: 0 % (ref 0.0–0.2)

## 2023-06-07 LAB — COMPREHENSIVE METABOLIC PANEL WITH GFR
ALT: 20 U/L (ref 0–44)
AST: 29 U/L (ref 15–41)
Albumin: 3.7 g/dL (ref 3.5–5.0)
Alkaline Phosphatase: 62 U/L (ref 38–126)
Anion gap: 10 (ref 5–15)
BUN: 14 mg/dL (ref 6–20)
CO2: 24 mmol/L (ref 22–32)
Calcium: 9.3 mg/dL (ref 8.9–10.3)
Chloride: 104 mmol/L (ref 98–111)
Creatinine, Ser: 0.88 mg/dL (ref 0.61–1.24)
GFR, Estimated: >60 mL/min (ref >60)
Glucose, Bld: 154 mg/dL — ABNORMAL HIGH (ref 70–99)
Potassium: 3.7 mmol/L (ref 3.5–5.1)
Sodium: 138 mmol/L (ref 135–145)
Total Bilirubin: 2 mg/dL — ABNORMAL HIGH (ref 0.0–1.2)
Total Protein: 6.2 g/dL — ABNORMAL LOW (ref 6.5–8.1)

## 2023-06-07 LAB — ACETAMINOPHEN LEVEL: Acetaminophen (Tylenol), Serum: <10 ug/mL — ABNORMAL LOW (ref 10–30)

## 2023-06-07 LAB — SALICYLATE LEVEL: Salicylate Lvl: <7 mg/dL — ABNORMAL LOW (ref 7.0–30.0)

## 2023-06-07 LAB — ETHANOL: Alcohol, Ethyl (B): <15 mg/dL (ref <15)

## 2023-06-07 NOTE — ED Notes (Signed)
 Pt moved to ED stretcher so he can go to CT. Bed alarm on. Pt was cooperative but very jerky with his movements. PT stated he knew he was at the hospital but that was all the verbal communication I could get out of the pt at this time.

## 2023-06-07 NOTE — ED Notes (Signed)
 Pt back from CT

## 2023-06-07 NOTE — ED Provider Notes (Signed)
 [facility] Provider Note    Event Date/Time   First MD Initiated Contact with Patient 06/07/23 1949     (approximate)   History   Drug Overdose   HPI  Franklin [name] is a 53 year old male with history of anxiety, depression, polysubstance use presenting to the emergency department for evaluation of altered mental status.  Patient was found outside acting erratically.  With EMS he was agitated, required IM Versed .  Initial heart rates in the 120s.  Patient did admit to using crack cocaine.  On my evaluation, patient unable to provide history.    Physical Exam   Triage Vital Signs: ED Triage Vitals  Encounter Vitals Group     BP 06/07/23 2008 123/70     Systolic BP Percentile --      Diastolic BP Percentile --      Pulse Rate 06/07/23 2008 97     Resp 06/07/23 2008 17     Temp 06/07/23 2008 97.8 F (36.6 C)     Temp Source 06/07/23 2008 Oral     SpO2 06/07/23 2008 94 %     Weight --      Height --      Head Circumference --      Peak Flow --      Pain Score 06/07/23 1956 Asleep     Pain Loc --      Pain Education --      Exclude from Growth Chart --     Most recent vital signs: Vitals:   06/07/23 2008  BP: 123/70  Pulse: 97  Resp: 17  Temp: 97.8 F (36.6 C)  SpO2: 94%     General: Alternate between somnolent and agitated, frequently moving around with mumbling speech CV:  Regular rate, good peripheral perfusion.  Resp:  Unlabored respirations.  Abd:  Nondistended.  Neuro:  Moving extremity spontaneously and equally   ED Results / Procedures / Treatments   Labs (all labs ordered are listed, but only abnormal results are displayed) Labs Reviewed  CBC WITH DIFFERENTIAL/PLATELET - Abnormal; Notable for the following components:      Result Value   RBC 4.11 (*)    Hemoglobin 11.8 (*)    HCT 35.9 (*)    All other components within normal limits  COMPREHENSIVE METABOLIC PANEL WITH GFR - Abnormal; Notable for the following  components:   Glucose, Bld 154 (*)    Total Protein 6.2 (*)    Total Bilirubin 2.0 (*)    All other components within normal limits  SALICYLATE LEVEL - Abnormal; Notable for the following components:   Salicylate Lvl <7.0 (*)    All other components within normal limits  ACETAMINOPHEN  LEVEL - Abnormal; Notable for the following components:   Acetaminophen  (Tylenol ), Serum <10 (*)    All other components within normal limits  ETHANOL  URINE DRUG SCREEN, QUALITATIVE ([facility] ONLY)     EKG EKG independently reviewed and interpreted by myself demonstrates:  EKG demonstrates normal sinus rhythm rate 93, PR 136, QRS 86, QTc 469, no acute ST changes  RADIOLOGY Imaging independently reviewed and interpreted by myself demonstrates:  CT head without acute abnormality  Formal Radiology Read:  CT Head Wo Contrast Result Date: 06/07/2023 CLINICAL DATA:  Altered mental status EXAM: CT HEAD WITHOUT CONTRAST TECHNIQUE: Contiguous axial images were obtained from the base of the skull through the vertex without intravenous contrast. RADIATION DOSE REDUCTION: This exam was performed according to the departmental dose-optimization program which includes  automated exposure control, adjustment of the mA and/or kV according to patient size and/or use of iterative reconstruction technique. COMPARISON:  None Available. FINDINGS: Brain: Normal anatomic configuration. No abnormal intra or extra-axial mass lesion or fluid collection. No abnormal mass effect or midline shift. No evidence of acute intracranial hemorrhage or infarct. Ventricular size is normal. Cerebellum unremarkable. Vascular: Unremarkable Skull: Intact Sinuses/Orbits: Mucosal thickening noted within the maxillary sinuses bilaterally. No air-fluid levels. Remaining paranasal sinuses are clear. Orbits are unremarkable. Other: Mastoid air cells and middle ear cavities are clear. IMPRESSION: 1. No acute intracranial abnormality. 2. Mild bilateral maxillary  sinus disease. Electronically Signed   By: Worthy Heads M.D.   On: 06/07/2023 22:14    PROCEDURES:  Critical Care performed: No  Procedures   MEDICATIONS ORDERED IN ED: Medications - No data to display   IMPRESSION / MDM / ASSESSMENT AND PLAN / ED COURSE  I reviewed the triage vital signs and the nursing notes.  Differential diagnosis includes, but is not limited to, primary psychiatric disorder, substance-induced mood disorder, acute stress response  Patient's presentation is most consistent with acute presentation with potential threat to life or bodily function.  53 year old male presenting with altered mental status.  Head CT without acute findings.  Labs with mild stable anemia otherwise reassuring.  Patient remains erratic on reevaluation.  Consideration for substance-induced mood disorder given clinical history, but cannot rule out primary psychiatric disorder.  Given persistent symptoms, will go ahead and consult psychiatry and TTS.  If patient wakes up and returns to baseline mental status, suspect he will be able to be discharged.  The patient has been placed in psychiatric observation due to the need to provide a safe environment for the patient while obtaining psychiatric consultation and evaluation, as well as ongoing medical and medication management to treat the patient's condition.  The patient has not been placed under full IVC at this time.     FINAL CLINICAL IMPRESSION(S) / ED DIAGNOSES   Final diagnoses:  Cocaine intoxication delirium (HCC)     Rx / DC Orders   ED Discharge Orders     None        Note:  This document was prepared using Dragon voice recognition software and may include unintentional dictation errors.   Claria Crofts, MD 06/07/23 [phone]

## 2023-06-07 NOTE — ED Notes (Signed)
 RN called lab about status of lab orders. Lab stated they will run the orders now.

## 2023-06-07 NOTE — ED Triage Notes (Signed)
 Pt to ED via [facility] from [facility] in [place]. Pt was found outside the facility acting erratically. PT admitted to EMS that he had used crack cocaine and has a history of the same.   Pt resting upon arrival.   EMS Gave 5mg  IM versed  Vitals were within normal limits other than a HR in the 120s.

## 2023-06-07 NOTE — ED Notes (Signed)
 Pt to CT with security officer and CT tech.

## 2023-06-08 ENCOUNTER — Other Ambulatory Visit (HOSPITAL_COMMUNITY)
Admission: EM | Admit: 2023-06-08 | Discharge: 2023-06-14 | Disposition: A | Discharge status: Home / Self Care | Payer: MEDICAID | Attending: Psychiatry | Admitting: Psychiatry

## 2023-06-08 ENCOUNTER — Other Ambulatory Visit: Payer: Self-pay

## 2023-06-08 DIAGNOSIS — J449 Chronic obstructive pulmonary disease, unspecified: Secondary | ICD-10-CM | POA: Insufficient documentation

## 2023-06-08 DIAGNOSIS — F17210 Nicotine dependence, cigarettes, uncomplicated: Secondary | ICD-10-CM | POA: Diagnosis not present

## 2023-06-08 DIAGNOSIS — F32A Depression, unspecified: Secondary | ICD-10-CM | POA: Diagnosis not present

## 2023-06-08 DIAGNOSIS — Z5900 Homelessness unspecified: Secondary | ICD-10-CM | POA: Insufficient documentation

## 2023-06-08 DIAGNOSIS — Z9151 Personal history of suicidal behavior: Secondary | ICD-10-CM | POA: Insufficient documentation

## 2023-06-08 DIAGNOSIS — Z7951 Long term (current) use of inhaled steroids: Secondary | ICD-10-CM | POA: Insufficient documentation

## 2023-06-08 DIAGNOSIS — Z79899 Other long term (current) drug therapy: Secondary | ICD-10-CM | POA: Insufficient documentation

## 2023-06-08 DIAGNOSIS — G4700 Insomnia, unspecified: Secondary | ICD-10-CM | POA: Insufficient documentation

## 2023-06-08 DIAGNOSIS — F411 Generalized anxiety disorder: Secondary | ICD-10-CM | POA: Diagnosis present

## 2023-06-08 DIAGNOSIS — F1424 Cocaine dependence with cocaine-induced mood disorder: Secondary | ICD-10-CM | POA: Insufficient documentation

## 2023-06-08 DIAGNOSIS — F17200 Nicotine dependence, unspecified, uncomplicated: Secondary | ICD-10-CM | POA: Diagnosis present

## 2023-06-08 DIAGNOSIS — I10 Essential (primary) hypertension: Secondary | ICD-10-CM | POA: Insufficient documentation

## 2023-06-08 DIAGNOSIS — F329 Major depressive disorder, single episode, unspecified: Secondary | ICD-10-CM | POA: Diagnosis present

## 2023-06-08 DIAGNOSIS — E559 Vitamin D deficiency, unspecified: Secondary | ICD-10-CM | POA: Diagnosis not present

## 2023-06-08 DIAGNOSIS — F1420 Cocaine dependence, uncomplicated: Secondary | ICD-10-CM | POA: Diagnosis present

## 2023-06-08 DIAGNOSIS — F1910 Other psychoactive substance abuse, uncomplicated: Secondary | ICD-10-CM | POA: Diagnosis present

## 2023-06-08 DIAGNOSIS — R45851 Suicidal ideations: Secondary | ICD-10-CM | POA: Insufficient documentation

## 2023-06-08 DIAGNOSIS — F1020 Alcohol dependence, uncomplicated: Secondary | ICD-10-CM | POA: Diagnosis present

## 2023-06-08 DIAGNOSIS — F1994 Other psychoactive substance use, unspecified with psychoactive substance-induced mood disorder: Secondary | ICD-10-CM

## 2023-06-08 LAB — URINE DRUG SCREEN, QUALITATIVE (ARMC ONLY)
Amphetamines, Ur Screen: NOT DETECTED
Barbiturates, Ur Screen: NOT DETECTED
Benzodiazepine, Ur Scrn: POSITIVE — AB
Cannabinoid 50 Ng, Ur ~~LOC~~: NOT DETECTED
Cocaine Metabolite,Ur ~~LOC~~: POSITIVE — AB
MDMA (Ecstasy)Ur Screen: NOT DETECTED
Methadone Scn, Ur: NOT DETECTED
Opiate, Ur Screen: NOT DETECTED
Phencyclidine (PCP) Ur S: NOT DETECTED
Tricyclic, Ur Screen: NOT DETECTED

## 2023-06-08 MED ORDER — LORAZEPAM 1 MG PO TABS: 1.0000 mg | ORAL_TABLET | Freq: Four times a day (QID) | ORAL | Status: DC | PRN

## 2023-06-08 MED ORDER — THIAMINE MONONITRATE 100 MG PO TABS
100.0000 mg | ORAL_TABLET | Freq: Every day | ORAL | Status: DC
  Administered 2023-06-09 – 2023-06-14 (×6): 100 mg via ORAL
  Filled 2023-06-08 (×6): qty 1

## 2023-06-08 MED ORDER — ADULT MULTIVITAMIN W/MINERALS CH
1.0000 | ORAL_TABLET | Freq: Every day | ORAL | Status: DC
  Administered 2023-06-08 – 2023-06-14 (×7): 1 via ORAL
  Filled 2023-06-08 (×7): qty 1

## 2023-06-08 MED ORDER — LOPERAMIDE HCL 2 MG PO CAPS: 2.0000 mg | ORAL_CAPSULE | ORAL | Status: AC | PRN

## 2023-06-08 MED ORDER — LORAZEPAM 1 MG PO TABS: 1.0000 mg | ORAL_TABLET | Freq: Three times a day (TID) | ORAL | Status: DC

## 2023-06-08 MED ORDER — FLUOXETINE HCL 10 MG PO CAPS
10.0000 mg | ORAL_CAPSULE | Freq: Every day | ORAL | Status: DC
  Administered 2023-06-08: 10 mg via ORAL
  Filled 2023-06-08 (×2): qty 1

## 2023-06-08 MED ORDER — THIAMINE HCL 100 MG/ML IJ SOLN
100.0000 mg | Freq: Once | INTRAMUSCULAR | Status: AC
  Administered 2023-06-08: 100 mg via INTRAMUSCULAR
  Filled 2023-06-08: qty 2

## 2023-06-08 MED ORDER — ARIPIPRAZOLE 10 MG PO TABS
10.0000 mg | ORAL_TABLET | Freq: Every day | ORAL | Status: DC
  Administered 2023-06-08: 10 mg via ORAL
  Filled 2023-06-08: qty 1

## 2023-06-08 MED ORDER — TRAZODONE HCL 50 MG PO TABS
50.0000 mg | ORAL_TABLET | Freq: Every evening | ORAL | Status: DC | PRN
  Administered 2023-06-08 – 2023-06-13 (×4): 50 mg via ORAL
  Filled 2023-06-08 (×4): qty 1

## 2023-06-08 MED ORDER — LORAZEPAM 1 MG PO TABS
1.0000 mg | ORAL_TABLET | Freq: Four times a day (QID) | ORAL | Status: DC
  Administered 2023-06-08 (×2): 1 mg via ORAL
  Filled 2023-06-08 (×2): qty 1

## 2023-06-08 MED ORDER — HYDROXYZINE HCL 25 MG PO TABS: 25.0000 mg | ORAL_TABLET | Freq: Four times a day (QID) | ORAL | Status: AC | PRN

## 2023-06-08 MED ORDER — LORAZEPAM 1 MG PO TABS: 1.0000 mg | ORAL_TABLET | Freq: Two times a day (BID) | ORAL | Status: DC

## 2023-06-08 MED ORDER — LORAZEPAM 1 MG PO TABS: 1.0000 mg | ORAL_TABLET | Freq: Every day | ORAL | Status: DC

## 2023-06-08 MED ORDER — ARIPIPRAZOLE 10 MG PO TABS
10.0000 mg | ORAL_TABLET | Freq: Every day | ORAL | Status: DC
  Administered 2023-06-09 – 2023-06-14 (×6): 10 mg via ORAL
  Filled 2023-06-08 (×6): qty 1

## 2023-06-08 MED ORDER — ONDANSETRON 4 MG PO TBDP: 4.0000 mg | ORAL_TABLET | Freq: Four times a day (QID) | ORAL | Status: AC | PRN

## 2023-06-08 NOTE — Group Note (Unsigned)
 Group Topic: Boundaries  Group Date: 06/08/2023 Start Time: 0300 End Time: 0400 Facilitators: Merline Starr, [place]  Department: [facility]  Number of Participants: 9  Group Focus: coping skills Treatment Modality:  Psychoeducation Interventions utilized were patient education Purpose: explore maladaptive thinking, improve communication skills, reinforce self-care, and trigger / craving management   Name: Franklin [name] Date of Birth: [birth year]  MR: [id number]    Level of Participation: {THERAPIES; PSYCH GROUP PARTICIPATION WUJWJ:19147} Quality of Participation: {THERAPIES; PSYCH QUALITY OF PARTICIPATION:23992} Interactions with others: {THERAPIES; PSYCH INTERACTIONS:23993} Mood/Affect: {THERAPIES; PSYCH MOOD/AFFECT:23994} Triggers (if applicable): *** Cognition: {THERAPIES; PSYCH COGNITION:23995} Progress: {THERAPIES; PSYCH PROGRESS:23997} Response: *** Plan: {THERAPIES; PSYCH WGNF:62130}  Patients Problems:  Patient Active Problem List   Diagnosis Date Noted   Hypokalemia 05/16/2023   A-fib [facility])    STD (male) 05/05/2023   Malingering 02/27/2023   MDD (major depressive disorder) 02/27/2023   Cocaine abuse with cocaine-induced mood disorder (HCC) 12/03/2022   Major depressive disorder, 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

## 2023-06-08 NOTE — Discharge Planning (Signed)
 LCSW met with patient to assess current mood, affect, physical state, and inquire about needs/goals while here in [facility] and after discharge. Patient reports he presented due for alcohol  and crack cocaine detox. Patient reports he was drinking 12 pack of beers and smoking crack daily. He has been living homeless for past 2 years and unemployed. Patient denies having access to transportation, and reports having limited social support. Patient reports his current goal is to seek residential at [facility] for substance use. Patient denies any prior history of outpatient care but reports doing a couple of residential stays at [facility] and [facility] and last residential program was approximately 6 months ago. inpatient substance abuse treatment. Patient currently denies any SI/HI/AVH. Patient is aware that LCSW will send referrals out for review and will follow up to provide updates as received. Patient expressed understanding and appreciation of LCSW assistance. No other needs were reported at this time by patient.

## 2023-06-08 NOTE — ED Notes (Signed)
 Psych team at bedside .

## 2023-06-08 NOTE — Group Note (Signed)
 Group Topic: Relapse and Recovery  Group Date: 06/08/2023 Start Time: 2000 End Time: 2100 Facilitators: Wendall Halls B  Department: [facility]  Number of Participants: 2  Group Focus: abuse issues, co-dependency, communication, coping skills, daily focus, goals/reality orientation, healthy friendships, and relapse prevention Treatment Modality:  Exposure Therapy Interventions utilized were leisure development Purpose: enhance coping skills, express feelings, express irrational fears, and relapse prevention strategies  Name: Franklin [name] Date of Birth: 24-Oct-1970  MR: [id number]    Level of Participation: PT DIDN'T ATTEND GROUP Quality of Participation: cooperative Interactions with others: gave feedback Mood/Affect: appropriate Triggers (if applicable): NA Cognition: coherent/clear Progress: None Response: NA Plan: patient will be encouraged to go to groups.   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

## 2023-06-08 NOTE — ED Provider Notes (Signed)
 Emergency Medicine Observation Re-evaluation Note  Franklin [name] is a 53 y.o. male, seen on rounds today.  Pt initially presented to the ED for complaints of Drug Overdose Currently, the patient is resting.  Physical Exam  BP (!) 145/91   Pulse 68   Temp 98.3 F (36.8 C) (Oral)   Resp 18   SpO2 98%  Physical Exam Gen:  No acute distress Resp:  Breathing easily and comfortably, no accessory muscle usage Neuro:  Moving all four extremities, no gross focal neuro deficits Psych:  Resting currently, calm when awake  ED Course / MDM  EKG:   I have reviewed the labs performed to date as well as medications administered while in observation.  Recent changes in the last 24 hours include initial EDP evaluation and psych evaluation..  Plan  Current plan is for transfer to [facility] after 8am.  Patient is voluntary.    Lynnda Sas, MD 06/08/23 [phone]

## 2023-06-08 NOTE — ED Notes (Signed)
 Recommended for inpatient treatment.

## 2023-06-08 NOTE — Group Note (Signed)
 Group Topic: Wellness  Group Date: 06/08/2023 Start Time: 1720 End Time: 1745 Facilitators: Pennie Box, RN  Department: [facility]  Number of Participants: 10  Group Focus: nursing group Treatment Modality:  Skills Training Interventions utilized were group exercise and patient education Purpose: improve communication skills  Name: Franklin [name] Date of Birth: March 20, 1970  MR: [id number]    Level of Participation: minimal Quality of Participation: isolative Interactions with others: pt left early without providing feedback Mood/Affect: flat Triggers (if applicable): n/a Cognition: coherent/clear Progress: Minimal Response: pt left early without providing feedback Plan: patient will be encouraged to attend future nursing groups   Patients Problems:  Patient Active Problem List   Diagnosis Date Noted   Hypokalemia 05/16/2023   A-fib [facility])    STD (male) 05/05/2023   Malingering 02/27/2023   MDD (major depressive disorder) 02/27/2023   Cocaine abuse with cocaine-induced mood disorder (HCC) 12/03/2022   Major depressive disorder, 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

## 2023-06-08 NOTE — ED Notes (Signed)
Vol /psych consult pending 

## 2023-06-08 NOTE — ED Notes (Signed)
 Pt given sandwich tray and water

## 2023-06-08 NOTE — ED Notes (Signed)
 Admission paperwork explained to pt, pt denied questions or concerns. Items placed directly in locker prior to escorting to unit. Skin assessment completed- tattoos on neck and superficial scratch's near L elbow/ antecubital crease. Mild swelling in feet, pt says its ' caused by walking a lot', c/o 8/10 pain. Pt observed to walk with care while ambulating. Pt denies si hi and avh- pt provided verbal contract for safety. Pt provided with lunch. Pt current resting in bed. Last BM today.

## 2023-06-08 NOTE — Consult Note (Addendum)
 [facility] Psychiatric Consult Initial  Patient Name: .Franklin [name]  MRN: [id number]  DOB: 1970/05/14  Consult Order details:  Orders (From admission, onward)     Start     Ordered   06/07/23 2320  IP CONSULT TO PSYCHIATRY       Ordering Provider: Claria Crofts, MD  Provider:  (Not yet assigned)  Question Answer Comment  Consult Timeframe ROUTINE - requires response within 24 hours   Reason for Consult? Consult for medication management   Contact phone number where the requesting provider can be reached [phone]      06/07/23 2319   06/07/23 2319  CONSULT TO CALL ACT TEAM       Ordering Provider: Claria Crofts, MD  Provider:  (Not yet assigned)  Question:  Reason for Consult?  Answer:  Psych consult   06/07/23 2319             Mode of Visit: Tele-visit Virtual Statement:TELE PSYCHIATRY ATTESTATION & CONSENT As the provider for this telehealth consult, I attest that I verified the patient's identity using two separate identifiers, introduced myself to the patient, provided my credentials, disclosed my location, and performed this encounter via a HIPAA-compliant, real-time, face-to-face, two-way, interactive audio and video platform and with the full consent and agreement of the patient (or guardian as applicable.) Patient physical location: [facility]. Telehealth provider physical location: home office in state of [place] .   Video start time:   Video end time:      Psychiatry Consult Evaluation  Service Date: June 08, 2023 LOS:  LOS: 0 days  Chief Complaint Cocaine Overdose  Primary Psychiatric Diagnoses  Polysubstance abuse disorder Assessment  Franklin [name] is a 53 y.o. male admitted: Presented to the ED on 06/07/2023  7:47 PM for evaluation following a cocaine overdose. He carries the psychiatric diagnoses of bipolar disorder and generalized anxiety disorder (GAD) and has a past medical history of chronic obstructive pulmonary disease (COPD).  His  current presentation of lethargy, jerky body movements, and recent substance use is most consistent with acute stimulant intoxication in the context of polysubstance use disorder. He meets criteria for substance use disorder, severe, with physiological dependence based on daily use of alcohol  and cocaine, physical symptoms, and functional impairment including homelessness.  Current outpatient psychotropic medications include none reported at this time, He was not compliant with medications prior to admission as evidenced by lack of reported current use and relapse into daily substance use.  On initial examination, patient reports feeling sleepy but is able to participate in the assessment interview. He exhibits jerky body movements and denies suicidal ideation, homicidal ideation, or auditory/visual hallucinations. He expresses readiness for treatment and reports a previous 5-year period of sobriety.  Diagnoses:  Active Hospital problems: Active Problems:   * No active hospital problems. *    Plan   ## Psychiatric Medication Recommendations:  Abilify 10 mg per day increase by 5 mg per day after 1 week.  ## Medical Decision Making Capacity: Not specifically addressed in this encounter   ## Disposition:-- There are no psychiatric contraindications to discharge at this time  ## Behavioral / Environmental: - No specific recommendations at this time.     ## Safety and Observation Level:  - Based on my clinical evaluation, I estimate the patient to be at low risk of self harm in the current setting. - At this time, we recommend  routine. This decision is based on my review of the chart including patient's  history and current presentation, interview of the patient, mental status examination, and consideration of suicide risk including evaluating suicidal ideation, plan, intent, suicidal or self-harm behaviors, risk factors, and protective factors. This judgment is based on our ability to directly  address suicide risk, implement suicide prevention strategies, and develop a safety plan while the patient is in the clinical setting. Please contact our team if there is a concern that risk level has changed.  CSSR Risk Category:C-SSRS RISK CATEGORY: No Risk  Suicide Risk Assessment: Patient has following modifiable risk factors for suicide: untreated depression, which we are addressing by inpatient. Patient has following non-modifiable or demographic risk factors for suicide: male gender Patient has the following protective factors against suicide: Cultural, spiritual, or religious beliefs that discourage suicide  Thank you for this consult request. Recommendations have been communicated to the primary team.  We will recommend inpatient admission at this time.   Herta Hink, NP       History of Present Illness  Relevant Aspects of Hospital ED Course:  Admitted on 06/07/2023 for Cocaine Overdose.   Patient Report:  Patient presents to the ED following a cocaine overdose. He states he feels sleepy but is able to participate in the interview. He denies suicidal ideation (SI), homicidal ideation (HI), and auditory/visual hallucinations (AVH). Patient reports a history of bipolar disorder, generalized anxiety disorder (GAD), and chronic obstructive pulmonary disease (COPD). He is currently homeless and reports daily alcohol  and cocaine use. He acknowledges his substance use and states he is ready to enter treatment. Reports his longest sobriety period lasted 5 years.  Psych ROS:  Depression: yes Anxiety:  yes Mania (lifetime and current): unknown Psychosis: (lifetime and current): unknown  Review of Systems  Constitutional: Negative.   HENT: Negative.    Eyes: Negative.   Respiratory: Negative.    Cardiovascular: Negative.   Gastrointestinal: Negative.   Genitourinary: Negative.   Musculoskeletal: Negative.   Skin: Negative.   Psychiatric/Behavioral:  Positive for substance  abuse.      Psychiatric and Social History  Psychiatric History:  Information collected from Patient and chart history  Prev Dx/Sx: bipolar, MDD, GAD Current Psych Provider: none Home Meds (current): none Previous Med Trials: unknown Therapy: none  Prior Psych Hospitalization: yes  Prior Self Harm: unknown Prior Violence: unknown  Family Psych History: unknown Family Hx suicide: unknown  Social History:  Developmental Hx: unknown Educational Hx: unknown Occupational Hx: unknown Legal Hx: unknown Living Situation: homeless Spiritual Hx: unknown Access to weapons/lethal means: no   Substance History Alcohol : yes  Type of alcohol  unknown Last Drink last night Number of drinks per day can't count them History of alcohol  withdrawal seizures yes History of DT's yes Tobacco: unknown Illicit drugs: crack Prescription drug abuse: denies Rehab hx: unknown  Exam Findings   Vital Signs:  Temp:  [97.8 F (36.6 C)] 97.8 F (36.6 C) (06/03 2008) Pulse Rate:  [97] 97 (06/03 2008) Resp:  [17] 17 (06/03 2008) BP: (123)/(70) 123/70 (06/03 2008) SpO2:  [94 %] 94 % (06/03 2008) Blood pressure 123/70, pulse 97, temperature 97.8 F (36.6 C), temperature source Oral, resp. rate 17, SpO2 94%. There is no height or weight on file to calculate BMI.  Physical Exam HENT:     Head: Normocephalic.     Nose: Nose normal.     Mouth/Throat:     Pharynx: Oropharynx is clear.  Cardiovascular:     Pulses: Normal pulses.  Pulmonary:     Effort: Pulmonary effort is  normal.  Musculoskeletal:        General: Normal range of motion.     Cervical back: Normal range of motion.  Skin:    General: Skin is dry.     Findings: Bruising present.  Neurological:     Mental Status: He is alert.     Other History   These have been pulled in through the EMR, reviewed, and updated if appropriate.  Family History:  The patient's family history includes Diabetes in an other family member;  Hypertension in an other family member.  Medical History: Past Medical History:  Diagnosis Date   A-fib (HCC)    Anxiety    COPD (chronic obstructive pulmonary disease) (HCC)    Depression    Substance abuse (HCC)     Surgical History: Past Surgical History:  Procedure Laterality Date   NO PAST SURGERIES       Medications:  No current facility-administered medications for this encounter.  Current Outpatient Medications:    albuterol  (VENTOLIN  HFA) 108 (90 Base) MCG/ACT inhaler, Inhale 2 puffs into the lungs every 6 (six) hours as needed for wheezing or shortness of breath., Disp: 8 g, Rfl: 0   FLUoxetine  (PROZAC ) 10 MG capsule, Take 3 capsules (30 mg total) by mouth daily., Disp: 90 capsule, Rfl: 0   fluticasone  furoate-vilanterol (BREO ELLIPTA ) 100-25 MCG/ACT AEPB, Inhale 1 puff into the lungs daily., Disp: 60 each, Rfl: 0   ipratropium-albuterol  (DUONEB) 0.5-2.5 (3) MG/3ML SOLN, Take 3 mLs by nebulization every 6 (six) hours as needed., Disp: 360 mL, Rfl: 0  Allergies: No Known Allergies  Anieya Helman, NP

## 2023-06-08 NOTE — ED Notes (Signed)
Voluntary consent signed

## 2023-06-08 NOTE — ED Notes (Signed)
 Pt currently in room, pt ate dinner. No complaints or concerns reported to RN. Pt left RN group early to go rest in room.

## 2023-06-08 NOTE — BH Assessment (Signed)
 Comprehensive Clinical Assessment (CCA) Note  06/08/2023 Franklin [name] [id number] Recommendations for Services/Supports/Treatments: Psych NP Stan Eans. determined pt. meets psychiatric inpatient criteria. Franklin [name] is a 53 year old, English speaking, Black male. Pt presented to [facility] ED voluntarily for a mental health evaluation. Per triage note: Pt to ED via [facility] [place]. Pt was found outside the facility acting erratically. PT admitted to EMS that he had used crack cocaine and has a history of the same.  Per patient report, his main stressors are being unhoused. Pt reported that he'd used cocaine and alcohol  prior to arrival. Pt was in the contemplation stage of change, explaining that he desires substance abuse treatment and wants to try [facility]. Pt reported that he has tremors and seizures for withdrawal symptoms. Pt is not medication compliant and is not connected to services. Pt had good insight and impaired judgement. Pt had slurred, rapid speech and his thoughts were relevant. Pt was cooperative throughout the assessment. Pt's mood was full range; affect was congruent. Pt had a disheveled appearance. Pt denied SI/HI/AV/H. Pt's BAL is unremarkable and UDS +for cocaine and benzos.  Chief Complaint:  Chief Complaint  Patient presents with   Drug Overdose   Visit Diagnosis: Cocaine use disorder, severe, dependence (HCC)   Major depressive disorder, recurrent severe without psychotic features (HCC)    CCA Screening, Triage and Referral (STR)  Patient Reported Information How did you hear about us ? Self  Referral name: No data recorded Referral phone number: No data recorded  Whom do you see for routine medical problems? No data recorded Practice/Facility Name: No data recorded Practice/Facility Phone Number: No data recorded Name of Contact: No data recorded Contact Number: No data recorded Contact Fax Number: No data recorded Prescriber Name: No data  recorded Prescriber Address (if known): No data recorded  What Is the Reason for Your Visit/Call Today? Pt states during triage "I am also feeling suicidal".  Pt admitts to drinking ETOH today and doing crack cocaine.  How Long Has This Been Causing You Problems? > than 6 months  What Do You Feel Would Help You the Most Today? Alcohol  or Drug Use Treatment; Medication(s)   Have You Recently Been in Any Inpatient Treatment (Hospital/Detox/Crisis Center/28-Day Program)? No data recorded Name/Location of Program/Hospital:No data recorded How Long Were You There? No data recorded When Were You Discharged? No data recorded  Have You Ever Received Services From [facility] Before? No data recorded Who Do You See at [facility]? No data recorded  Have You Recently Had Any Thoughts About Hurting Yourself? Yes  Are You Planning to Commit Suicide/Harm Yourself At This time? Yes   Have you Recently Had Thoughts About Hurting Someone Franklin [name]? No  Explanation: Pt endorsed thoughts of SI.   Have You Used Any Alcohol  or Drugs in the Past 24 Hours? Yes  How Long Ago Did You Use Drugs or Alcohol ? No data recorded What Did You Use and How Much? Pt reported using an unknown amount of alcohol  and crack.   Do You Currently Have a Therapist/Psychiatrist? No  Name of Therapist/Psychiatrist: No data recorded  Have You Been Recently Discharged From Any Office Practice or Programs? No  Explanation of Discharge From Practice/Program: No data recorded    CCA Screening Triage Referral Assessment Type of Contact: Face-to-Face  Is this Initial or Reassessment? No data recorded Date Telepsych consult ordered in [facility]:  No data recorded Time Telepsych consult ordered in [facility]:  No data recorded  Patient Reported  Information Reviewed? No data recorded Patient Left Without Being Seen? No data recorded Reason for Not Completing Assessment: No data recorded  Collateral Involvement: None   Does Patient  Have a Court Appointed Legal Guardian? No data recorded Name and Contact of Legal Guardian: No data recorded If Minor and Not Living with Parent(s), Who has Custody? n/a  Is CPS involved or ever been involved? Never  Is APS involved or ever been involved? Never   Patient Determined To Be At Risk for Harm To Self or Others Based on Review of Patient Reported Information or Presenting Complaint? No  Method: No Plan  Availability of Means: No access or NA  Intent: Vague intent or NA  Notification Required: No need or identified person  Additional Information for Danger to Others Potential: Previous attempts  Additional Comments for Danger to Others Potential: N/A  Are There Guns or Other Weapons in Your Home? No  Types of Guns/Weapons: N/A  Are These Weapons Safely Secured?                            No  Who Could Verify You Are Able To Have These Secured: No data recorded Do You Have any Outstanding Charges, Pending Court Dates, Parole/Probation? Pt denied  Contacted To Inform of Risk of Harm To Self or Others: Other: Comment   Location of Assessment: [facility] ED   Does Patient Present under Involuntary Commitment? No  IVC Papers Initial File Date: No data recorded  [place] of Residence: [place]   Patient Currently Receiving the Following Services: Not Receiving Services   Determination of Need: Emergent (2 hours)   Options For Referral: ED Visit; Chemical Dependency Intensive Outpatient Therapy (CDIOP)     CCA Biopsychosocial Intake/Chief Complaint:  No data recorded Current Symptoms/Problems: No data recorded  Patient Reported Schizophrenia/Schizoaffective Diagnosis in Past: No   Strengths: Have some insight, seeking help and polite.  Preferences: No data recorded Abilities: No data recorded  Type of Services Patient Feels are Needed: No data recorded  Initial Clinical Notes/Concerns: No data recorded  Mental Health Symptoms Depression:  Difficulty  Concentrating; Hopelessness; Worthlessness   Duration of Depressive symptoms: Greater than two weeks   Mania:  Recklessness; Change in energy/activity   Anxiety:   N/A   Psychosis:  No data recorded  Duration of Psychotic symptoms: No data recorded  Trauma:  N/A   Obsessions:  N/A   Compulsions:  N/A   Inattention:  No data recorded  Hyperactivity/Impulsivity:  N/A   Oppositional/Defiant Behaviors:  N/A   Emotional Irregularity:  N/A   Other Mood/Personality Symptoms:  No data recorded   Mental Status Exam Appearance and self-care  Stature:  Average   Weight:  Average weight   Clothing:  Neat/clean; Age-appropriate   Grooming:  Normal   Cosmetic use:  None   Posture/gait:  Normal   Motor activity:  -- (Within normal range)   Sensorium  Attention:  Normal   Concentration:  Normal   Orientation:  X5   Recall/memory:  Normal   Affect and Mood  Affect:  Appropriate; Depressed   Mood:  Depressed   Relating  Eye contact:  Normal   Facial expression:  Depressed   Attitude toward examiner:  Cooperative   Thought and Language  Speech flow: Clear and Coherent   Thought content:  Appropriate to Mood and Circumstances   Preoccupation:  None   Hallucinations:  None   Organization:  No  data recorded  [employer] of Knowledge:  Fair   Intelligence:  Average   Abstraction:  Functional   Judgement:  Fair; Impaired   Reality Testing:  Realistic   Insight:  Fair   Decision Making:  Normal   Social Functioning  Social Maturity:  Isolates   Social Judgement:  Naive; "Chief of Staff"; Heedless   Stress  Stressors:  Surveyor, quantity; Housing   Coping Ability:  Exhausted   Skill Deficits:  None   Supports:  Support needed     Religion:    Leisure/Recreation:    Exercise/Diet:     CCA Employment/Education Employment/Work Situation:    Education:     CCA Family/Childhood History Family and Relationship History:     Childhood History:     Child/Adolescent Assessment:     CCA Substance Use Alcohol /Drug Use:                           ASAM's:  Six Dimensions of Multidimensional Assessment  Dimension 1:  Acute Intoxication and/or Withdrawal Potential:      Dimension 2:  Biomedical Conditions and Complications:      Dimension 3:  Emotional, Behavioral, or Cognitive Conditions and Complications:     Dimension 4:  Readiness to Change:     Dimension 5:  Relapse, Continued use, or Continued Problem Potential:     Dimension 6:  Recovery/Living Environment:     ASAM Severity Score:    ASAM Recommended Level of Treatment:     Substance use Disorder (SUD)    Recommendations for Services/Supports/Treatments:    DSM5 Diagnoses: Patient Active Problem List   Diagnosis Date Noted   Hypokalemia 05/16/2023   A-fib (HCC)    STD (male) 05/05/2023   Malingering 02/27/2023   MDD (major depressive disorder) 02/27/2023   Cocaine abuse with cocaine-induced mood disorder (HCC) 12/03/2022   Major depressive disorder, 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    Patient Centered Plan: Patient is on the following Treatment Plan(s):  Substance Abuse  Morio Widen R Ashaun Gaughan, LCAS

## 2023-06-08 NOTE — ED Notes (Signed)
 Pt dressed out in hospital scrubs and socks  Pt's belongings:  Neuro[employer]

## 2023-06-08 NOTE — ED Notes (Signed)
 This RN called [facility] to give report. RN unable to take report at this time. Left phone number and request for a return call.

## 2023-06-08 NOTE — ED Notes (Addendum)
 This pt has been accepted to [facility] Room 155 06/08/23. Pt can arrive anytime after 8 AM.

## 2023-06-08 NOTE — ED Notes (Signed)
Pt given water at his request.

## 2023-06-08 NOTE — BH Assessment (Signed)
 Patient has been accepted to [facility] [place].  Patient assigned to room 155 Accepting physician is Dr. Dr Champ Coma.  Call report to  .  Representative was [place].   ER Staff is aware of it:  Glenda, ER Secretary  Dr. Lorrane Rosette, ER MD  Bonnye Butts, Patient's Nurse     Patient can arrive 06/08/23 after 8 AM.

## 2023-06-08 NOTE — ED Provider Notes (Signed)
 Facility Based Crisis Admission H&P  Date: 06/08/23 Patient Name: Charlton Boule MRN: [id number] Chief Complaint: "alcohol  and drugs, tripping"  Diagnoses:  Final diagnoses:  None    HPI: On admission to the ED per TTS, Jasmine Faulcon: 53 year old, English speaking, Black male. Pt presented to [facility] ED voluntarily for a mental health evaluation. Per triage note: Pt to ED via [facility] [place]. Pt was found outside the facility acting erratically. PT admitted to EMS that he had used crack cocaine and has a history of the same.  Per patient report, his main stressors are being unhoused. Pt reported that he'd used cocaine and alcohol  prior to arrival. Pt was in the contemplation stage of change, explaining that he desires substance abuse treatment and wants to try [facility]. Pt reported that he has tremors and seizures for withdrawal symptoms. Pt is not medication compliant and is not connected to services. Pt had good insight and impaired judgement. Pt had slurred, rapid speech and his thoughts were relevant. Pt was cooperative throughout the assessment. Pt's mood was full range; affect was congruent. Pt had a disheveled appearance. Pt denied SI/HI/AV/H. Pt's BAL is unremarkable and UDS +for cocaine and benzos.   Admission to [facility] on 6/4: The client reported using alcohol  and drugs "and tripping" prior to going to the ED.  He stated he was drinking a 12 pack to a case daily of beer and using a gram a day of crack via smoking prior to admission, positive for cocaine and benzodiazepines with no alcohol .  Denies use of nicotine  or other substances.  High depression with no suicidal ideations, poor motivation and energy, dysphoric mood, and feelings of hopelessness.  High anxiety with restlessness, excessive worry, overwhelmed with no panic attacks.  Denies trauma.  Sleep is not good with poor initiation and maintenance.  Appetite is "good" with no recent weight changes.  Denies hallucinations,  paranoia, homicidal ideations, and current w/d symptoms or cravings.  His last rehab was a year ago at [facility] and receives his medication management at [facility] in [place].  He is currently houseless with a supportive sister.  Currently, he desires rehab after his detox.   PHQ 2-9:  Flowsheet Row ED from 06/08/2023 in [facility] from 12/03/2022 in [facility]  Thoughts that you would be better off dead, or of hurting yourself in some way Not at all Not at all  PHQ-9 Total Score 9 0       Flowsheet Row ED from 06/08/2023 in [facility] ED from 06/07/2023 in [facility] Emergency Department at [facility] ED to Hosp-Admission (Discharged) from 05/16/2023 in [facility] 1C MEDICAL TELEMETRY  C-SSRS RISK CATEGORY No Risk No Risk No Risk         Total Time spent with patient: 45 minutes  Musculoskeletal  Strength & Muscle Tone: within normal limits Gait & Station: normal Patient leans: N/A  Psychiatric Specialty Exam: Physical Exam Vitals and nursing note reviewed.  Constitutional:      Appearance: Normal appearance.  HENT:     Head: Normocephalic.     Nose: Nose normal.  Pulmonary:     Effort: Pulmonary effort is normal.  Musculoskeletal:        General: Normal range of motion.     Cervical back: Normal range of motion.  Neurological:     General: No focal deficit present.     Mental Status: He  is alert and oriented to person, place, and time.     Review of Systems  Psychiatric/Behavioral:  Positive for depression and substance abuse. The patient is nervous/anxious.   All other systems reviewed and are negative.   There were no vitals taken for this visit.There is no height or weight on file to calculate BMI.  General Appearance: Casual  Eye Contact:  Fair  Speech:  Normal Rate  Volume:  Decreased  Mood:  Anxious and Depressed  Affect:  Blunt   Thought Process:  Coherent  Orientation:  Full (Time, Place, and Person)  Thought Content:  Logical  Suicidal Thoughts:  No  Homicidal Thoughts:  No  Memory:  Immediate;   Fair Recent;   Fair Remote;   Fair  Judgement:  Fair  Insight:  Fair  Psychomotor Activity:  Decreased  Concentration:  Concentration: Fair and Attention Span: Fair  Recall:  [employer] of Knowledge:  Fair  Language:  Good  Akathisia:  No  Handed:  Right  AIMS (if indicated):     Assets:  Leisure Time Physical Health Resilience Social Support  ADL's:  Intact  Cognition:  WNL  Sleep:        Physical Exam Vitals and nursing note reviewed.  Constitutional:      Appearance: Normal appearance.  HENT:     Head: Normocephalic.     Nose: Nose normal.  Pulmonary:     Effort: Pulmonary effort is normal.  Musculoskeletal:        General: Normal range of motion.     Cervical back: Normal range of motion.  Neurological:     General: No focal deficit present.     Mental Status: He is alert and oriented to person, place, and time.    Review of Systems  Psychiatric/Behavioral:  Positive for depression and substance abuse. The patient is nervous/anxious.   All other systems reviewed and are negative.   There were no vitals taken for this visit. There is no height or weight on file to calculate BMI.  Past Psychiatric History: depression, anxiety, alcohol  and cocaine use d/o   Is the patient at risk to self? No  Has the patient been a risk to self in the past 6 months? No .    Has the patient been a risk to self within the distant past? No   Is the patient a risk to others? No   Has the patient been a risk to others in the past 6 months? No   Has the patient been a risk to others within the distant past? No   Past Medical History: COPD Family History: none Social History: none  Last Labs:  Admission on 06/07/2023, Discharged on 06/08/2023  Component Date Value Ref Range Status   WBC 06/07/2023 6.9   4.0 - 10.5 K/uL Final   RBC 06/07/2023 4.11 (L)  4.22 - 5.81 MIL/uL Final   Hemoglobin 06/07/2023 11.8 (L)  13.0 - 17.0 g/dL Final   HCT 82/95/6213 35.9 (L)  39.0 - 52.0 % Final   MCV 06/07/2023 87.3  80.0 - 100.0 fL Final   MCH 06/07/2023 28.7  26.0 - 34.0 pg Final   MCHC 06/07/2023 32.9  30.0 - 36.0 g/dL Final   RDW 08/65/7846 15.1  11.5 - 15.5 % Final   Platelets 06/07/2023 249  150 - 400 K/uL Final   nRBC 06/07/2023 0.0  0.0 - 0.2 % Final   Neutrophils Relative % 06/07/2023 66  % Final  Neutro Abs 06/07/2023 4.6  1.7 - 7.7 K/uL Final   Lymphocytes Relative 06/07/2023 22  % Final   Lymphs Abs 06/07/2023 1.5  0.7 - 4.0 K/uL Final   Monocytes Relative 06/07/2023 9  % Final   Monocytes Absolute 06/07/2023 0.6  0.1 - 1.0 K/uL Final   Eosinophils Relative 06/07/2023 2  % Final   Eosinophils Absolute 06/07/2023 0.1  0.0 - 0.5 K/uL Final   Basophils Relative 06/07/2023 1  % Final   Basophils Absolute 06/07/2023 0.0  0.0 - 0.1 K/uL Final   Immature Granulocytes 06/07/2023 0  % Final   Abs Immature Granulocytes 06/07/2023 0.03  0.00 - 0.07 K/uL Final   Performed at [facility], [address] [place], [place] [address]   Sodium 06/07/2023 138  135 - 145 mmol/L Final   Potassium 06/07/2023 3.7  3.5 - 5.1 mmol/L Final   Chloride 06/07/2023 104  98 - 111 mmol/L Final   CO2 06/07/2023 24  22 - 32 mmol/L Final   Glucose, Bld 06/07/2023 154 (H)  70 - 99 mg/dL Final   Glucose reference range applies only to samples taken after fasting for at least 8 hours.   BUN 06/07/2023 14  6 - 20 mg/dL Final   Creatinine, Ser 06/07/2023 0.88  0.61 - 1.24 mg/dL Final   Calcium 19/14/7829 9.3  8.9 - 10.3 mg/dL Final   Total Protein 56/21/3086 6.2 (L)  6.5 - 8.1 g/dL Final   Albumin 57/84/6962 3.7  3.5 - 5.0 g/dL Final   AST 95/28/4132 29  15 - 41 U/L Final   ALT 06/07/2023 20  0 - 44 U/L Final   Alkaline Phosphatase 06/07/2023 62  38 - 126 U/L Final   Total Bilirubin 06/07/2023 2.0 (H)  0.0 -  1.2 mg/dL Final   GFR, Estimated 06/07/2023 >60  >60 mL/min Final   Comment: (NOTE) Calculated using the CKD-EPI Creatinine Equation (2021)    Anion gap 06/07/2023 10  5 - 15 Final   Performed at [facility], [address] [place], [place] [address]   Salicylate Lvl 06/07/2023 <7.0 (L)  7.0 - 30.0 mg/dL Final   Performed at [facility], [address] [place], [place] [address]   Acetaminophen  (Tylenol ), Serum 06/07/2023 <10 (L)  10 - 30 ug/mL Final   Comment: (NOTE) Therapeutic concentrations vary significantly. A range of 10-30 ug/mL  may be an effective concentration for many patients. However, some  are best treated at concentrations outside of this range. Acetaminophen  concentrations >150 ug/mL at 4 hours after ingestion  and >50 ug/mL at 12 hours after ingestion are often associated with  toxic reactions.  Performed at [facility], [address] [place], [place] [address]    Alcohol , Ethyl (B) 06/07/2023 <15  <15 mg/dL Final   Comment: (NOTE) For medical purposes only. Performed at [facility], [address] [place], [place] [address]    Tricyclic, Ur Screen 06/08/2023 NONE DETECTED  NONE DETECTED Final   Amphetamines, Ur Screen 06/08/2023 NONE DETECTED  NONE DETECTED Final   MDMA (Ecstasy)Ur Screen 06/08/2023 NONE DETECTED  NONE DETECTED Final   Cocaine Metabolite,Ur [place] 06/08/2023 POSITIVE (A)  NONE DETECTED Final   Opiate, Ur Screen 06/08/2023 NONE DETECTED  NONE DETECTED Final   Phencyclidine (PCP) Ur S 06/08/2023 NONE DETECTED  NONE DETECTED Final   Cannabinoid 50 Ng, Ur [place] 06/08/2023 NONE DETECTED  NONE DETECTED Final   Barbiturates, Ur Screen 06/08/2023 NONE DETECTED  NONE  DETECTED Final   Benzodiazepine, Ur Scrn 06/08/2023 POSITIVE (A)  NONE DETECTED Final   Methadone Scn, Ur 06/08/2023 NONE DETECTED  NONE DETECTED Final   Comment: (NOTE) Tricyclics + metabolites, urine    Cutoff 1000 ng/mL Amphetamines +  metabolites, urine  Cutoff 1000 ng/mL MDMA (Ecstasy), urine              Cutoff 500 ng/mL Cocaine Metabolite, urine          Cutoff 300 ng/mL Opiate + metabolites, urine        Cutoff 300 ng/mL Phencyclidine (PCP), urine         Cutoff 25 ng/mL Cannabinoid, urine                 Cutoff 50 ng/mL Barbiturates + metabolites, urine  Cutoff 200 ng/mL Benzodiazepine, urine              Cutoff 200 ng/mL Methadone, urine                   Cutoff 300 ng/mL  The urine drug screen provides only a preliminary, unconfirmed analytical test result and should not be used for non-medical purposes. Clinical consideration and professional judgment should be applied to any positive drug screen result due to possible interfering substances. A more specific alternate chemical method must be used in order to obtain a confirmed analytical result. Gas chromatography / mass spectrometry (GC/MS) is the preferred confirm                          atory method. Performed at [facility], [address] [place], [place] [address]   Admission on 05/16/2023, Discharged on 05/17/2023  Component Date Value Ref Range Status   WBC 05/16/2023 8.2  4.0 - 10.5 K/uL Final   RBC 05/16/2023 4.86  4.22 - 5.81 MIL/uL Final   Hemoglobin 05/16/2023 14.3  13.0 - 17.0 g/dL Final   HCT 60/45/4098 43.8  39.0 - 52.0 % Final   MCV 05/16/2023 90.1  80.0 - 100.0 fL Final   MCH 05/16/2023 29.4  26.0 - 34.0 pg Final   MCHC 05/16/2023 32.6  30.0 - 36.0 g/dL Final   RDW 11/91/4782 15.2  11.5 - 15.5 % Final   Platelets 05/16/2023 348  150 - 400 K/uL Final   nRBC 05/16/2023 0.0  0.0 - 0.2 % Final   Neutrophils Relative % 05/16/2023 61  % Final   Neutro Abs 05/16/2023 5.2  1.7 - 7.7 K/uL Final   Lymphocytes Relative 05/16/2023 30  % Final   Lymphs Abs 05/16/2023 2.4  0.7 - 4.0 K/uL Final   Monocytes Relative 05/16/2023 5  % Final   Monocytes Absolute 05/16/2023 0.4  0.1 - 1.0 K/uL Final   Eosinophils Relative 05/16/2023 3  %  Final   Eosinophils Absolute 05/16/2023 0.2  0.0 - 0.5 K/uL Final   Basophils Relative 05/16/2023 1  % Final   Basophils Absolute 05/16/2023 0.1  0.0 - 0.1 K/uL Final   Immature Granulocytes 05/16/2023 0  % Final   Abs Immature Granulocytes 05/16/2023 0.02  0.00 - 0.07 K/uL Final   Performed at [facility], [address] [place], [place] [address]   Sodium 05/16/2023 139  135 - 145 mmol/L Final   Potassium 05/16/2023 3.2 (L)  3.5 - 5.1 mmol/L Final   Chloride 05/16/2023 102  98 - 111 mmol/L Final   CO2 05/16/2023 24  22 - 32 mmol/L Final  Glucose, Bld 05/16/2023 125 (H)  70 - 99 mg/dL Final   Glucose reference range applies only to samples taken after fasting for at least 8 hours.   BUN 05/16/2023 7  6 - 20 mg/dL Final   Creatinine, Ser 05/16/2023 1.08  0.61 - 1.24 mg/dL Final   Calcium 76/16/0737 9.1  8.9 - 10.3 mg/dL Final   GFR, Estimated 05/16/2023 >60  >60 mL/min Final   Comment: (NOTE) Calculated using the CKD-EPI Creatinine Equation (2021)    Anion gap 05/16/2023 13  5 - 15 Final   Performed at [facility], [address] [place], [place] [address]   Troponin I (High Sensitivity) 05/16/2023 6  <18 ng/L Final   Comment: (NOTE) Elevated high sensitivity troponin I (hsTnI) values and significant  changes across serial measurements may suggest ACS but many other  chronic and acute conditions are known to elevate hsTnI results.  Refer to the "Links" section for chest pain algorithms and additional  guidance. Performed at [facility], [address] [place], [place] [address]    SARS Coronavirus 2 by RT PCR 05/16/2023 NEGATIVE  NEGATIVE Final   Comment: (NOTE) SARS-CoV-2 target nucleic acids are NOT DETECTED.  The SARS-CoV-2 RNA is generally detectable in upper respiratory specimens during the acute phase of infection. The lowest concentration of SARS-CoV-2 viral copies this assay can detect is 138 copies/mL. A negative result does not  preclude SARS-Cov-2 infection and should not be used as the sole basis for treatment or other patient management decisions. A negative result may occur with  improper specimen collection/handling, submission of specimen other than nasopharyngeal swab, presence of viral mutation(s) within the areas targeted by this assay, and inadequate number of viral copies(<138 copies/mL). A negative result must be combined with clinical observations, patient history, and epidemiological information. The expected result is Negative.  Fact Sheet for Patients:  BloggerCourse.com  Fact Sheet for Healthcare Providers:  SeriousBroker.it  This test is no                          t yet approved or cleared by the [place]  FDA and  has been authorized for detection and/or diagnosis of SARS-CoV-2 by FDA under an Emergency Use Authorization (EUA). This EUA will remain  in effect (meaning this test can be used) for the duration of the COVID-19 declaration under Section 564(b)(1) of the Act, 21 U.S.C.section 360bbb-3(b)(1), unless the authorization is terminated  or revoked sooner.       Influenza A by PCR 05/16/2023 NEGATIVE  NEGATIVE Final   Influenza B by PCR 05/16/2023 NEGATIVE  NEGATIVE Final   Comment: (NOTE) The Xpert Xpress SARS-CoV-2/FLU/RSV plus assay is intended as an aid in the diagnosis of influenza from Nasopharyngeal swab specimens and should not be used as a sole basis for treatment. Nasal washings and aspirates are unacceptable for Xpert Xpress SARS-CoV-2/FLU/RSV testing.  Fact Sheet for Patients: BloggerCourse.com  Fact Sheet for Healthcare Providers: SeriousBroker.it  This test is not yet approved or cleared by the [place]  FDA and has been authorized for detection and/or diagnosis of SARS-CoV-2 by FDA under an Emergency Use Authorization (EUA). This EUA will remain in  effect (meaning this test can be used) for the duration of the COVID-19 declaration under Section 564(b)(1) of the Act, 21 U.S.C. section 360bbb-3(b)(1), unless the authorization is terminated or revoked.     Resp Syncytial Virus by PCR 05/16/2023 NEGATIVE  NEGATIVE Final   Comment: (  NOTE) Fact Sheet for Patients: BloggerCourse.com  Fact Sheet for Healthcare Providers: SeriousBroker.it  This test is not yet approved or cleared by the [place]  FDA and has been authorized for detection and/or diagnosis of SARS-CoV-2 by FDA under an Emergency Use Authorization (EUA). This EUA will remain in effect (meaning this test can be used) for the duration of the COVID-19 declaration under Section 564(b)(1) of the Act, 21 U.S.C. section 360bbb-3(b)(1), unless the authorization is terminated or revoked.  Performed at [facility], [address] [place], [place] [address]    pH, Ven 05/16/2023 7.38  7.25 - 7.43 Final   pCO2, Ven 05/16/2023 44  44 - 60 mmHg Final   pO2, Ven 05/16/2023 96 (H)  32 - 45 mmHg Final   Bicarbonate 05/16/2023 26.0  20.0 - 28.0 mmol/L Final   Acid-Base Excess 05/16/2023 0.5  0.0 - 2.0 mmol/L Final   O2 Saturation 05/16/2023 98.7  % Final   Patient temperature 05/16/2023 37.0   Final   Collection site 05/16/2023 VEIN   Final   Performed at [facility], [address] [place], [place] [address]   Troponin I (High Sensitivity) 05/16/2023 5  <18 ng/L Final   Comment: (NOTE) Elevated high sensitivity troponin I (hsTnI) values and significant  changes across serial measurements may suggest ACS but many other  chronic and acute conditions are known to elevate hsTnI results.  Refer to the "Links" section for chest pain algorithms and additional  guidance. Performed at [facility], [address] [place], [place] [address]    Tricyclic, Ur Screen 05/16/2023 NONE DETECTED  NONE  DETECTED Final   Amphetamines, Ur Screen 05/16/2023 POSITIVE (A)  NONE DETECTED Final   Comment: (NOTE) Trazodone  is metabolized in vivo to several metabolites, including pharmacologically active m-CPP, which is excreted in the urine. Immunoassay screens for amphetamines and MDMA have potential cross-reactivity with these compounds and may provide false positive  results.     MDMA (Ecstasy)Ur Screen 05/16/2023 NONE DETECTED  NONE DETECTED Final   Cocaine Metabolite,Ur [place] 05/16/2023 POSITIVE (A)  NONE DETECTED Final   Opiate, Ur Screen 05/16/2023 NONE DETECTED  NONE DETECTED Final   Phencyclidine (PCP) Ur S 05/16/2023 NONE DETECTED  NONE DETECTED Final   Cannabinoid 50 Ng, Ur [place] 05/16/2023 NONE DETECTED  NONE DETECTED Final   Barbiturates, Ur Screen 05/16/2023 NONE DETECTED  NONE DETECTED Final   Benzodiazepine, Ur Scrn 05/16/2023 NONE DETECTED  NONE DETECTED Final   Methadone Scn, Ur 05/16/2023 NONE DETECTED  NONE DETECTED Final   Comment: (NOTE) Tricyclics + metabolites, urine    Cutoff 1000 ng/mL Amphetamines + metabolites, urine  Cutoff 1000 ng/mL MDMA (Ecstasy), urine              Cutoff 500 ng/mL Cocaine Metabolite, urine          Cutoff 300 ng/mL Opiate + metabolites, urine        Cutoff 300 ng/mL Phencyclidine (PCP), urine         Cutoff 25 ng/mL Cannabinoid, urine                 Cutoff 50 ng/mL Barbiturates + metabolites, urine  Cutoff 200 ng/mL Benzodiazepine, urine              Cutoff 200 ng/mL Methadone, urine                   Cutoff 300 ng/mL  The urine drug screen provides only a preliminary, unconfirmed  analytical test result and should not be used for non-medical purposes. Clinical consideration and professional judgment should be applied to any positive drug screen result due to possible interfering substances. A more specific alternate chemical method must be used in order to obtain a confirmed analytical result. Gas chromatography / mass spectrometry (GC/MS) is  the preferred confirm                          atory method. Performed at [facility], [address] [place], [place] [address]    Sodium 05/17/2023 136  135 - 145 mmol/L Final   Potassium 05/17/2023 3.7  3.5 - 5.1 mmol/L Final   Chloride 05/17/2023 104  98 - 111 mmol/L Final   CO2 05/17/2023 25  22 - 32 mmol/L Final   Glucose, Bld 05/17/2023 124 (H)  70 - 99 mg/dL Final   Glucose reference range applies only to samples taken after fasting for at least 8 hours.   BUN 05/17/2023 12  6 - 20 mg/dL Final   Creatinine, Ser 05/17/2023 0.92  0.61 - 1.24 mg/dL Final   Calcium 21/30/8657 8.6 (L)  8.9 - 10.3 mg/dL Final   GFR, Estimated 05/17/2023 >60  >60 mL/min Final   Comment: (NOTE) Calculated using the CKD-EPI Creatinine Equation (2021)    Anion gap 05/17/2023 7  5 - 15 Final   Performed at [facility], [address] [place], [place] [address]   Magnesium  05/17/2023 2.1  1.7 - 2.4 mg/dL Final   Performed at [facility], [address] [place], [place] [address]   WBC 05/17/2023 10.4  4.0 - 10.5 K/uL Final   RBC 05/17/2023 4.30  4.22 - 5.81 MIL/uL Final   Hemoglobin 05/17/2023 12.6 (L)  13.0 - 17.0 g/dL Final   HCT 41/32/4401 38.5 (L)  39.0 - 52.0 % Final   MCV 05/17/2023 89.5  80.0 - 100.0 fL Final   MCH 05/17/2023 29.3  26.0 - 34.0 pg Final   MCHC 05/17/2023 32.7  30.0 - 36.0 g/dL Final   RDW 02/72/5366 15.3  11.5 - 15.5 % Final   Platelets 05/17/2023 311  150 - 400 K/uL Final   nRBC 05/17/2023 0.0  0.0 - 0.2 % Final   Performed at [facility], [address] [place], [place] [address]  Admission on 05/09/2023, Discharged on 05/09/2023  Component Date Value Ref Range Status   SARS Coronavirus 2 by RT PCR 05/09/2023 NEGATIVE  NEGATIVE Final   Influenza A by PCR 05/09/2023 NEGATIVE  NEGATIVE Final   Influenza B by PCR 05/09/2023 NEGATIVE  NEGATIVE Final   Comment: (NOTE) The Xpert Xpress SARS-CoV-2/FLU/RSV plus assay is intended as  an aid in the diagnosis of influenza from Nasopharyngeal swab specimens and should not be used as a sole basis for treatment. Nasal washings and aspirates are unacceptable for Xpert Xpress SARS-CoV-2/FLU/RSV testing.  Fact Sheet for Patients: BloggerCourse.com  Fact Sheet for Healthcare Providers: SeriousBroker.it  This test is not yet approved or cleared by the [place]  FDA and has been authorized for detection and/or diagnosis of SARS-CoV-2 by FDA under an Emergency Use Authorization (EUA). This EUA will remain in effect (meaning this test can be used) for the duration of the COVID-19 declaration under Section 564(b)(1) of the Act, 21 U.S.C. section 360bbb-3(b)(1), unless the authorization is terminated or revoked.     Resp Syncytial Virus by PCR 05/09/2023 NEGATIVE  NEGATIVE Final   Comment: (NOTE)  Fact Sheet for Patients: BloggerCourse.com  Fact Sheet for Healthcare Providers: SeriousBroker.it  This test is not yet approved or cleared by the [place]  FDA and has been authorized for detection and/or diagnosis of SARS-CoV-2 by FDA under an Emergency Use Authorization (EUA). This EUA will remain in effect (meaning this test can be used) for the duration of the COVID-19 declaration under Section 564(b)(1) of the Act, 21 U.S.C. section 360bbb-3(b)(1), unless the authorization is terminated or revoked.  Performed at [facility], [address], [place], [place] [address]    Sodium 05/09/2023 139  135 - 145 mmol/L Final   Potassium 05/09/2023 4.1  3.5 - 5.1 mmol/L Final   Chloride 05/09/2023 102  98 - 111 mmol/L Final   CO2 05/09/2023 26  22 - 32 mmol/L Final   Glucose, Bld 05/09/2023 191 (H)  70 - 99 mg/dL Final   Glucose reference range applies only to samples taken after fasting for at least 8 hours.   BUN 05/09/2023 15  6 - 20 mg/dL Final   Creatinine, Ser  05/09/2023 1.08  0.61 - 1.24 mg/dL Final   Calcium 84/13/2440 9.1  8.9 - 10.3 mg/dL Final   Total Protein 10/31/2534 6.7  6.5 - 8.1 g/dL Final   Albumin 64/40/3474 3.8  3.5 - 5.0 g/dL Final   AST 25/95/6387 30  15 - 41 U/L Final   ALT 05/09/2023 24  0 - 44 U/L Final   Alkaline Phosphatase 05/09/2023 57  38 - 126 U/L Final   Total Bilirubin 05/09/2023 1.1  0.0 - 1.2 mg/dL Final   GFR, Estimated 05/09/2023 >60  >60 mL/min Final   Comment: (NOTE) Calculated using the CKD-EPI Creatinine Equation (2021)    Anion gap 05/09/2023 11  5 - 15 Final   Performed at [facility], [address] [place], [place] [address]   WBC 05/09/2023 10.4  4.0 - 10.5 K/uL Final   RBC 05/09/2023 5.36  4.22 - 5.81 MIL/uL Final   Hemoglobin 05/09/2023 15.5  13.0 - 17.0 g/dL Final   HCT 29/51/8841 47.1  39.0 - 52.0 % Final   MCV 05/09/2023 87.9  80.0 - 100.0 fL Final   MCH 05/09/2023 28.9  26.0 - 34.0 pg Final   MCHC 05/09/2023 32.9  30.0 - 36.0 g/dL Final   RDW 66/06/3014 14.9  11.5 - 15.5 % Final   Platelets 05/09/2023 369  150 - 400 K/uL Final   nRBC 05/09/2023 0.0  0.0 - 0.2 % Final   Performed at [facility], [address] [place], [place] [address]   pH, Ven 05/09/2023 7.4  7.25 - 7.43 Final   pCO2, Ven 05/09/2023 47  44 - 60 mmHg Final   pO2, Ven 05/09/2023 89 (H)  32 - 45 mmHg Final   Bicarbonate 05/09/2023 29.1 (H)  20.0 - 28.0 mmol/L Final   Acid-Base Excess 05/09/2023 3.5 (H)  0.0 - 2.0 mmol/L Final   O2 Saturation 05/09/2023 98.8  % Final   Patient temperature 05/09/2023 37.0   Final   Collection site 05/09/2023 LEFT ANTECUBITAL   Final   IV   Drawn by 05/09/2023 DRAWN BY RN   Final   Performed at [facility], [address], [place], [place] [address]  Admission on 05/04/2023, Discharged on 05/07/2023  Component Date Value Ref Range Status   WBC 05/04/2023 10.0  4.0 - 10.5 K/uL Final   RBC 05/04/2023 5.15  4.22 - 5.81 MIL/uL Final  Hemoglobin 05/04/2023 14.9  13.0 -  17.0 g/dL Final   HCT 30/86/5784 46.2  39.0 - 52.0 % Final   MCV 05/04/2023 89.7  80.0 - 100.0 fL Final   MCH 05/04/2023 28.9  26.0 - 34.0 pg Final   MCHC 05/04/2023 32.3  30.0 - 36.0 g/dL Final   RDW 69/62/9528 14.8  11.5 - 15.5 % Final   Platelets 05/04/2023 342  150 - 400 K/uL Final   nRBC 05/04/2023 0.0  0.0 - 0.2 % Final   Neutrophils Relative % 05/04/2023 58  % Final   Neutro Abs 05/04/2023 5.8  1.7 - 7.7 K/uL Final   Lymphocytes Relative 05/04/2023 29  % Final   Lymphs Abs 05/04/2023 2.9  0.7 - 4.0 K/uL Final   Monocytes Relative 05/04/2023 5  % Final   Monocytes Absolute 05/04/2023 0.5  0.1 - 1.0 K/uL Final   Eosinophils Relative 05/04/2023 7  % Final   Eosinophils Absolute 05/04/2023 0.7 (H)  0.0 - 0.5 K/uL Final   Basophils Relative 05/04/2023 1  % Final   Basophils Absolute 05/04/2023 0.1  0.0 - 0.1 K/uL Final   Immature Granulocytes 05/04/2023 0  % Final   Abs Immature Granulocytes 05/04/2023 0.03  0.00 - 0.07 K/uL Final   Performed at [facility], [address] [place], [place] [address]   Sodium 05/04/2023 139  135 - 145 mmol/L Final   Potassium 05/04/2023 3.8  3.5 - 5.1 mmol/L Final   Chloride 05/04/2023 106  98 - 111 mmol/L Final   CO2 05/04/2023 25  22 - 32 mmol/L Final   Glucose, Bld 05/04/2023 138 (H)  70 - 99 mg/dL Final   Glucose reference range applies only to samples taken after fasting for at least 8 hours.   BUN 05/04/2023 10  6 - 20 mg/dL Final   Creatinine, Ser 05/04/2023 0.90  0.61 - 1.24 mg/dL Final   Calcium 40/10/2723 9.0  8.9 - 10.3 mg/dL Final   GFR, Estimated 05/04/2023 >60  >60 mL/min Final   Comment: (NOTE) Calculated using the CKD-EPI Creatinine Equation (2021)    Anion gap 05/04/2023 8  5 - 15 Final   Performed at [facility], [address], [place], [place] [address]   pH, Ven 05/04/2023 7.32  7.25 - 7.43 Final   pCO2, Ven 05/04/2023 60  44 - 60 mmHg Final   pO2, Ven 05/04/2023 42  32 - 45 mmHg Final   Bicarbonate  05/04/2023 30.7 (H)  20.0 - 28.0 mmol/L Final   Acid-Base Excess 05/04/2023 2.8 (H)  0.0 - 2.0 mmol/L Final   O2 Saturation 05/04/2023 72.7  % Final   Patient temperature 05/04/2023 36.5   Final   Performed at [facility], [address], [place], [place] [address]   SARS Coronavirus 2 by RT PCR 05/04/2023 NEGATIVE  NEGATIVE Final   Influenza A by PCR 05/04/2023 NEGATIVE  NEGATIVE Final   Influenza B by PCR 05/04/2023 NEGATIVE  NEGATIVE Final   Comment: (NOTE) The Xpert Xpress SARS-CoV-2/FLU/RSV plus assay is intended as an aid in the diagnosis of influenza from Nasopharyngeal swab specimens and should not be used as a sole basis for treatment. Nasal washings and aspirates are unacceptable for Xpert Xpress SARS-CoV-2/FLU/RSV testing.  Fact Sheet for Patients: BloggerCourse.com  Fact Sheet for Healthcare Providers: SeriousBroker.it  This test is not yet approved or cleared by the [place]  FDA and has been authorized for detection and/or diagnosis of SARS-CoV-2 by  FDA under an Emergency Use Authorization (EUA). This EUA will remain in effect (meaning this test can be used) for the duration of the COVID-19 declaration under Section 564(b)(1) of the Act, 21 U.S.C. section 360bbb-3(b)(1), unless the authorization is terminated or revoked.     Resp Syncytial Virus by PCR 05/04/2023 NEGATIVE  NEGATIVE Final   Comment: (NOTE) Fact Sheet for Patients: BloggerCourse.com  Fact Sheet for Healthcare Providers: SeriousBroker.it  This test is not yet approved or cleared by the [place]  FDA and has been authorized for detection and/or diagnosis of SARS-CoV-2 by FDA under an Emergency Use Authorization (EUA). This EUA will remain in effect (meaning this test can be used) for the duration of the COVID-19 declaration under Section 564(b)(1) of the Act, 21 U.S.C. section  360bbb-3(b)(1), unless the authorization is terminated or revoked.  Performed at [facility], [address], [place], [place] [address]    Neisseria Gonorrhea 05/05/2023 Negative   Final   Chlamydia 05/05/2023 Negative   Final   Comment 05/05/2023 Normal Reference Ranger Chlamydia - Negative   Final   Comment 05/05/2023 Normal Reference Range Neisseria Gonorrhea - Negative   Final   HIV Screen 4th Generation wRfx 05/05/2023 Non Reactive  Non Reactive Final   Performed at [facility], [address], [place], [place] [address]   Sodium 05/06/2023 139  135 - 145 mmol/L Final   Potassium 05/06/2023 3.8  3.5 - 5.1 mmol/L Final   Chloride 05/06/2023 107  98 - 111 mmol/L Final   CO2 05/06/2023 23  22 - 32 mmol/L Final   Glucose, Bld 05/06/2023 169 (H)  70 - 99 mg/dL Final   Glucose reference range applies only to samples taken after fasting for at least 8 hours.   BUN 05/06/2023 10  6 - 20 mg/dL Final   Creatinine, Ser 05/06/2023 0.90  0.61 - 1.24 mg/dL Final   Calcium 29/56/2130 9.0  8.9 - 10.3 mg/dL Final   GFR, Estimated 05/06/2023 >60  >60 mL/min Final   Comment: (NOTE) Calculated using the CKD-EPI Creatinine Equation (2021)    Anion gap 05/06/2023 9  5 - 15 Final   Performed at [facility], [address] [place], [place] [address]   WBC 05/06/2023 15.4 (H)  4.0 - 10.5 K/uL Final   RBC 05/06/2023 4.59  4.22 - 5.81 MIL/uL Final   Hemoglobin 05/06/2023 13.2  13.0 - 17.0 g/dL Final   HCT 46/96/2952 40.3  39.0 - 52.0 % Final   MCV 05/06/2023 87.8  80.0 - 100.0 fL Final   MCH 05/06/2023 28.8  26.0 - 34.0 pg Final   MCHC 05/06/2023 32.8  30.0 - 36.0 g/dL Final   RDW 84/13/2440 15.1  11.5 - 15.5 % Final   Platelets 05/06/2023 317  150 - 400 K/uL Final   nRBC 05/06/2023 0.0  0.0 - 0.2 % Final   Performed at [facility], [address], [place], [place] [address]  Admission on 04/29/2023, Discharged on 04/29/2023  Component Date Value Ref Range Status    Sodium 04/29/2023 138  135 - 145 mmol/L Final   Potassium 04/29/2023 3.8  3.5 - 5.1 mmol/L Final   Chloride 04/29/2023 104  98 - 111 mmol/L Final   CO2 04/29/2023 22  22 - 32 mmol/L Final   Glucose, Bld 04/29/2023 101 (H)  70 - 99 mg/dL Final   Glucose reference range applies only to samples taken after fasting for at least 8  hours.   BUN 04/29/2023 20  6 - 20 mg/dL Final   Creatinine, Ser 04/29/2023 1.11  0.61 - 1.24 mg/dL Final   Calcium 86/57/8469 9.5  8.9 - 10.3 mg/dL Final   Total Protein 62/95/2841 8.1  6.5 - 8.1 g/dL Final   Albumin 32/44/0102 4.4  3.5 - 5.0 g/dL Final   AST 72/53/6644 25  15 - 41 U/L Final   ALT 04/29/2023 19  0 - 44 U/L Final   Alkaline Phosphatase 04/29/2023 64  38 - 126 U/L Final   Total Bilirubin 04/29/2023 2.0 (H)  0.0 - 1.2 mg/dL Final   GFR, Estimated 04/29/2023 >60  >60 mL/min Final   Comment: (NOTE) Calculated using the CKD-EPI Creatinine Equation (2021)    Anion gap 04/29/2023 12  5 - 15 Final   Performed at [facility], [address] [place], [place] [address]   Troponin I (High Sensitivity) 04/29/2023 6  <18 ng/L Final   Comment: (NOTE) Elevated high sensitivity troponin I (hsTnI) values and significant  changes across serial measurements may suggest ACS but many other  chronic and acute conditions are known to elevate hsTnI results.  Refer to the "Links" section for chest pain algorithms and additional  guidance. Performed at [facility], [address] [place], [place] [address]    WBC 04/29/2023 9.3  4.0 - 10.5 K/uL Final   RBC 04/29/2023 4.93  4.22 - 5.81 MIL/uL Final   Hemoglobin 04/29/2023 14.3  13.0 - 17.0 g/dL Final   HCT 38/75/6433 42.7  39.0 - 52.0 % Final   MCV 04/29/2023 86.6  80.0 - 100.0 fL Final   MCH 04/29/2023 29.0  26.0 - 34.0 pg Final   MCHC 04/29/2023 33.5  30.0 - 36.0 g/dL Final   RDW 29/51/8841 14.6  11.5 - 15.5 % Final   Platelets 04/29/2023 278  150 - 400 K/uL Final   nRBC 04/29/2023 0.0  0.0  - 0.2 % Final   Neutrophils Relative % 04/29/2023 61  % Final   Neutro Abs 04/29/2023 5.8  1.7 - 7.7 K/uL Final   Lymphocytes Relative 04/29/2023 22  % Final   Lymphs Abs 04/29/2023 2.0  0.7 - 4.0 K/uL Final   Monocytes Relative 04/29/2023 12  % Final   Monocytes Absolute 04/29/2023 1.1 (H)  0.1 - 1.0 K/uL Final   Eosinophils Relative 04/29/2023 4  % Final   Eosinophils Absolute 04/29/2023 0.3  0.0 - 0.5 K/uL Final   Basophils Relative 04/29/2023 1  % Final   Basophils Absolute 04/29/2023 0.1  0.0 - 0.1 K/uL Final   Immature Granulocytes 04/29/2023 0  % Final   Abs Immature Granulocytes 04/29/2023 0.02  0.00 - 0.07 K/uL Final   Performed at [facility], [address] [place], [place] [address]   B Natriuretic Peptide 04/29/2023 21.2  0.0 - 100.0 pg/mL Final   Performed at [facility], [address] [place], [place] [address]  Admission on 02/27/2023, Discharged on 03/15/2023  Component Date Value Ref Range Status   Hgb A1c MFr Bld 03/05/2023 5.6  4.8 - 5.6 % Final   Comment: (NOTE) Pre diabetes:          5.7%-6.4%  Diabetes:              >6.4%  Glycemic control for   <7.0% adults with diabetes    Mean Plasma Glucose 03/05/2023 114.02  mg/dL Final   Performed at [facility], [address],  [place], [place] [address]   Cholesterol 03/05/2023 193  0 - 200 mg/dL Final   Triglycerides 37/62/8315 178 (H)  <150 mg/dL Final   HDL 17/61/6073 52  >40 mg/dL Final   Total CHOL/HDL Ratio 03/05/2023 3.7  RATIO Final   VLDL 03/05/2023 36  0 - 40 mg/dL Final   LDL Cholesterol 03/05/2023 105 (H)  0 - 99 mg/dL Final   Comment:        Total Cholesterol/HDL:CHD Risk Coronary Heart Disease Risk Table                     Men   Women  1/2 Average Risk   3.4   3.3  Average Risk       5.0   4.4  2 X Average Risk   9.6   7.1  3 X Average Risk  23.4   11.0        Use the calculated Patient Ratio above and the CHD Risk Table to determine the patient's CHD Risk.         ATP III CLASSIFICATION (LDL):  <100     mg/dL   Optimal  710-626  mg/dL   Near or Above                    Optimal  130-159  mg/dL   Borderline  948-546  mg/dL   High  >270     mg/dL   Very High Performed at [facility], [address] [place], [place] [address]   Admission on 02/27/2023, Discharged on 02/27/2023  Component Date Value Ref Range Status   Sodium 02/27/2023 137  135 - 145 mmol/L Final   Potassium 02/27/2023 3.6  3.5 - 5.1 mmol/L Final   Chloride 02/27/2023 99  98 - 111 mmol/L Final   CO2 02/27/2023 25  22 - 32 mmol/L Final   Glucose, Bld 02/27/2023 106 (H)  70 - 99 mg/dL Final   Glucose reference range applies only to samples taken after fasting for at least 8 hours.   BUN 02/27/2023 11  6 - 20 mg/dL Final   Creatinine, Ser 02/27/2023 0.97  0.61 - 1.24 mg/dL Final   Calcium 38/18/2993 9.5  8.9 - 10.3 mg/dL Final   Total Protein 71/69/6789 7.6  6.5 - 8.1 g/dL Final   Albumin 38/10/1749 4.1  3.5 - 5.0 g/dL Final   AST 02/58/5277 27  15 - 41 U/L Final   ALT 02/27/2023 20  0 - 44 U/L Final   Alkaline Phosphatase 02/27/2023 59  38 - 126 U/L Final   Total Bilirubin 02/27/2023 1.0  0.0 - 1.2 mg/dL Final   GFR, Estimated 02/27/2023 >60  >60 mL/min Final   Comment: (NOTE) Calculated using the CKD-EPI Creatinine Equation (2021)    Anion gap 02/27/2023 13  5 - 15 Final   Performed at [facility], [address] [place], [place] [address]   Alcohol , Ethyl (B) 02/27/2023 <10  <10 mg/dL Final   Comment: (NOTE) Lowest detectable limit for serum alcohol  is 10 mg/dL.  For medical purposes only. Performed at [facility], [address] [place], [place] [address]    Salicylate Lvl 02/27/2023 <7.0 (L)  7.0 - 30.0 mg/dL Final   Performed at [facility], [address] [place], [place] [address]   Acetaminophen  (Tylenol ), Serum 02/27/2023 <10 (L)  10 - 30 ug/mL Final   Comment: (NOTE) Therapeutic concentrations vary  significantly. A range of 10-30  ug/mL  may be an effective concentration for many patients. However, some  are best treated at concentrations outside of this range. Acetaminophen  concentrations >150 ug/mL at 4 hours after ingestion  and >50 ug/mL at 12 hours after ingestion are often associated with  toxic reactions.  Performed at [facility], [address] [place], [place] [address]    WBC 02/27/2023 7.0  4.0 - 10.5 K/uL Final   RBC 02/27/2023 4.92  4.22 - 5.81 MIL/uL Final   Hemoglobin 02/27/2023 14.4  13.0 - 17.0 g/dL Final   HCT 95/62/1308 44.1  39.0 - 52.0 % Final   MCV 02/27/2023 89.6  80.0 - 100.0 fL Final   MCH 02/27/2023 29.3  26.0 - 34.0 pg Final   MCHC 02/27/2023 32.7  30.0 - 36.0 g/dL Final   RDW 65/78/4696 14.2  11.5 - 15.5 % Final   Platelets 02/27/2023 413 (H)  150 - 400 K/uL Final   nRBC 02/27/2023 0.0  0.0 - 0.2 % Final   Performed at [facility], [address] [place], [place] [address]   Troponin I (High Sensitivity) 02/27/2023 5  <18 ng/L Final   Comment: (NOTE) Elevated high sensitivity troponin I (hsTnI) values and significant  changes across serial measurements may suggest ACS but many other  chronic and acute conditions are known to elevate hsTnI results.  Refer to the "Links" section for chest pain algorithms and additional  guidance. Performed at [facility], [address] [place], [place] [address]    SARS Coronavirus 2 by RT PCR 02/27/2023 NEGATIVE  NEGATIVE Final   Comment: (NOTE) SARS-CoV-2 target nucleic acids are NOT DETECTED.  The SARS-CoV-2 RNA is generally detectable in upper respiratory specimens during the acute phase of infection. The lowest concentration of SARS-CoV-2 viral copies this assay can detect is 138 copies/mL. A negative result does not preclude SARS-Cov-2 infection and should not be used as the sole basis for treatment or other patient management decisions. A negative result may occur with   improper specimen collection/handling, submission of specimen other than nasopharyngeal swab, presence of viral mutation(s) within the areas targeted by this assay, and inadequate number of viral copies(<138 copies/mL). A negative result must be combined with clinical observations, patient history, and epidemiological information. The expected result is Negative.  Fact Sheet for Patients:  BloggerCourse.com  Fact Sheet for Healthcare Providers:  SeriousBroker.it  This test is no                          t yet approved or cleared by the [place]  FDA and  has been authorized for detection and/or diagnosis of SARS-CoV-2 by FDA under an Emergency Use Authorization (EUA). This EUA will remain  in effect (meaning this test can be used) for the duration of the COVID-19 declaration under Section 564(b)(1) of the Act, 21 U.S.C.section 360bbb-3(b)(1), unless the authorization is terminated  or revoked sooner.       Influenza A by PCR 02/27/2023 NEGATIVE  NEGATIVE Final   Influenza B by PCR 02/27/2023 NEGATIVE  NEGATIVE Final   Comment: (NOTE) The Xpert Xpress SARS-CoV-2/FLU/RSV plus assay is intended as an aid in the diagnosis of influenza from Nasopharyngeal swab specimens and should not be used as a sole basis for treatment. Nasal washings and aspirates are unacceptable for Xpert Xpress SARS-CoV-2/FLU/RSV testing.  Fact Sheet for Patients: BloggerCourse.com  Fact Sheet for Healthcare Providers: SeriousBroker.it  This test is not yet approved or cleared  by the [place]  FDA and has been authorized for detection and/or diagnosis of SARS-CoV-2 by FDA under an Emergency Use Authorization (EUA). This EUA will remain in effect (meaning this test can be used) for the duration of the COVID-19 declaration under Section 564(b)(1) of the Act, 21 U.S.C. section 360bbb-3(b)(1), unless  the authorization is terminated or revoked.     Resp Syncytial Virus by PCR 02/27/2023 NEGATIVE  NEGATIVE Final   Comment: (NOTE) Fact Sheet for Patients: BloggerCourse.com  Fact Sheet for Healthcare Providers: SeriousBroker.it  This test is not yet approved or cleared by the [place]  FDA and has been authorized for detection and/or diagnosis of SARS-CoV-2 by FDA under an Emergency Use Authorization (EUA). This EUA will remain in effect (meaning this test can be used) for the duration of the COVID-19 declaration under Section 564(b)(1) of the Act, 21 U.S.C. section 360bbb-3(b)(1), unless the authorization is terminated or revoked.  Performed at [facility], [address] [place], [place] [address]    Troponin I (High Sensitivity) 02/27/2023 5  <18 ng/L Final   Comment: (NOTE) Elevated high sensitivity troponin I (hsTnI) values and significant  changes across serial measurements may suggest ACS but many other  chronic and acute conditions are known to elevate hsTnI results.  Refer to the "Links" section for chest pain algorithms and additional  guidance. Performed at [facility], [address] [place], [place] [address]    Color, Urine 02/27/2023 YELLOW (A)  YELLOW Final   APPearance 02/27/2023 HAZY (A)  CLEAR Final   Specific Gravity, Urine 02/27/2023 1.021  1.005 - 1.030 Final   pH 02/27/2023 8.0  5.0 - 8.0 Final   Glucose, UA 02/27/2023 150 (A)  NEGATIVE mg/dL Final   Hgb urine dipstick 02/27/2023 NEGATIVE  NEGATIVE Final   Bilirubin Urine 02/27/2023 NEGATIVE  NEGATIVE Final   Ketones, ur 02/27/2023 NEGATIVE  NEGATIVE mg/dL Final   Protein, ur 82/95/6213 NEGATIVE  NEGATIVE mg/dL Final   Nitrite 08/65/7846 NEGATIVE  NEGATIVE Final   Leukocytes,Ua 02/27/2023 NEGATIVE  NEGATIVE Final   RBC / HPF 02/27/2023 0-5  0 - 5 RBC/hpf Final   WBC, UA 02/27/2023 0-5  0 - 5 WBC/hpf Final   Bacteria, UA  02/27/2023 NONE SEEN  NONE SEEN Final   Squamous Epithelial / HPF 02/27/2023 0-5  0 - 5 /HPF Final   Mucus 02/27/2023 PRESENT   Final   Performed at [facility], [address] [place], [place] [address]   Specimen source GC/Chlam 02/27/2023 URINE, RANDOM   Final   Chlamydia Tr 02/27/2023 NOT DETECTED  NOT DETECTED Final   N gonorrhoeae 02/27/2023 NOT DETECTED  NOT DETECTED Final   Comment: (NOTE) This CT/NG assay has not been evaluated in patients with a history of  hysterectomy. Performed at [facility], [address] [place], [place] [address]    Tricyclic, Ur Screen 02/27/2023 NONE DETECTED  NONE DETECTED Final   Amphetamines, Ur Screen 02/27/2023 NONE DETECTED  NONE DETECTED Final   MDMA (Ecstasy)Ur Screen 02/27/2023 NONE DETECTED  NONE DETECTED Final   Cocaine Metabolite,Ur [place] 02/27/2023 POSITIVE (A)  NONE DETECTED Final   Opiate, Ur Screen 02/27/2023 NONE DETECTED  NONE DETECTED Final   Phencyclidine (PCP) Ur S 02/27/2023 NONE DETECTED  NONE DETECTED Final   Cannabinoid 50 Ng, Ur  02/27/2023 NONE DETECTED  NONE DETECTED Final   Barbiturates, Ur Screen 02/27/2023 NONE DETECTED  NONE DETECTED Final   Benzodiazepine, Ur Scrn 02/27/2023 POSITIVE (A)  NONE  DETECTED Final   Methadone Scn, Ur 02/27/2023 NONE DETECTED  NONE DETECTED Final   Comment: (NOTE) Tricyclics + metabolites, urine    Cutoff 1000 ng/mL Amphetamines + metabolites, urine  Cutoff 1000 ng/mL MDMA (Ecstasy), urine              Cutoff 500 ng/mL Cocaine Metabolite, urine          Cutoff 300 ng/mL Opiate + metabolites, urine        Cutoff 300 ng/mL Phencyclidine (PCP), urine         Cutoff 25 ng/mL Cannabinoid, urine                 Cutoff 50 ng/mL Barbiturates + metabolites, urine  Cutoff 200 ng/mL Benzodiazepine, urine              Cutoff 200 ng/mL Methadone, urine                   Cutoff 300 ng/mL  The urine drug screen provides only a preliminary, unconfirmed analytical test result and  should not be used for non-medical purposes. Clinical consideration and professional judgment should be applied to any positive drug screen result due to possible interfering substances. A more specific alternate chemical method must be used in order to obtain a confirmed analytical result. Gas chromatography / mass spectrometry (GC/MS) is the preferred confirm                          atory method. Performed at [facility], [address] [place], [place] [address]   Admission on 02/18/2023, Discharged on 02/19/2023  Component Date Value Ref Range Status   Sodium 02/19/2023 134 (L)  135 - 145 mmol/L Final   Potassium 02/19/2023 4.3  3.5 - 5.1 mmol/L Final   Chloride 02/19/2023 100  98 - 111 mmol/L Final   CO2 02/19/2023 24  22 - 32 mmol/L Final   Glucose, Bld 02/19/2023 213 (H)  70 - 99 mg/dL Final   Glucose reference range applies only to samples taken after fasting for at least 8 hours.   BUN 02/19/2023 15  6 - 20 mg/dL Final   Creatinine, Ser 02/19/2023 0.95  0.61 - 1.24 mg/dL Final   Calcium 60/45/4098 9.5  8.9 - 10.3 mg/dL Final   Total Protein 11/91/4782 6.6  6.5 - 8.1 g/dL Final   Albumin 95/62/1308 3.4 (L)  3.5 - 5.0 g/dL Final   AST 65/78/4696 30  15 - 41 U/L Final   ALT 02/19/2023 19  0 - 44 U/L Final   Alkaline Phosphatase 02/19/2023 60  38 - 126 U/L Final   Total Bilirubin 02/19/2023 0.4  0.0 - 1.2 mg/dL Final   GFR, Estimated 02/19/2023 >60  >60 mL/min Final   Comment: (NOTE) Calculated using the CKD-EPI Creatinine Equation (2021)    Anion gap 02/19/2023 10  5 - 15 Final   Performed at [facility], [address] [place], [place] [address]   Alcohol , Ethyl (B) 02/19/2023 <10  <10 mg/dL Final   Comment: (NOTE) Lowest detectable limit for serum alcohol  is 10 mg/dL.  For medical purposes only. Performed at [facility], [address] [place], [place] [address]    Salicylate Lvl 02/19/2023 <7.0 (L)  7.0 - 30.0 mg/dL Final   Performed at  [facility], [address], [place], [place] [address]   Acetaminophen  (Tylenol ), Serum 02/19/2023 <10 (L)  10 -  30 ug/mL Final   Comment: (NOTE) Therapeutic concentrations vary significantly. A range of 10-30 ug/mL  may be an effective concentration for many patients. However, some  are best treated at concentrations outside of this range. Acetaminophen  concentrations >150 ug/mL at 4 hours after ingestion  and >50 ug/mL at 12 hours after ingestion are often associated with  toxic reactions.  Performed at [facility], [address], [place], [place] [address]    WBC 02/19/2023 4.5  4.0 - 10.5 K/uL Final   RBC 02/19/2023 4.73  4.22 - 5.81 MIL/uL Final   Hemoglobin 02/19/2023 13.8  13.0 - 17.0 g/dL Final   HCT 60/45/4098 41.8  39.0 - 52.0 % Final   MCV 02/19/2023 88.4  80.0 - 100.0 fL Final   MCH 02/19/2023 29.2  26.0 - 34.0 pg Final   MCHC 02/19/2023 33.0  30.0 - 36.0 g/dL Final   RDW 11/91/4782 13.6  11.5 - 15.5 % Final   Platelets 02/19/2023 373  150 - 400 K/uL Final   nRBC 02/19/2023 0.0  0.0 - 0.2 % Final   Performed at [facility], [address] [place], [place] [address]   Opiates 02/19/2023 NONE DETECTED  NONE DETECTED Final   Cocaine 02/19/2023 POSITIVE (A)  NONE DETECTED Final   Benzodiazepines 02/19/2023 NONE DETECTED  NONE DETECTED Final   Amphetamines 02/19/2023 NONE DETECTED  NONE DETECTED Final   Tetrahydrocannabinol 02/19/2023 NONE DETECTED  NONE DETECTED Final   Barbiturates 02/19/2023 NONE DETECTED  NONE DETECTED Final   Comment: (NOTE) DRUG SCREEN FOR MEDICAL PURPOSES ONLY.  IF CONFIRMATION IS NEEDED FOR ANY PURPOSE, NOTIFY LAB WITHIN 5 DAYS.  LOWEST DETECTABLE LIMITS FOR URINE DRUG SCREEN Drug Class                     Cutoff (ng/mL) Amphetamine and metabolites    1000 Barbiturate and metabolites    200 Benzodiazepine                 200 Opiates and metabolites        300 Cocaine and metabolites        300 THC                             50 Performed at [facility], [address], [place], [place] [address]   Admission on 02/18/2023, Discharged on 02/18/2023  Component Date Value Ref Range Status   Sodium 02/18/2023 137  135 - 145 mmol/L Final   Potassium 02/18/2023 4.1  3.5 - 5.1 mmol/L Final   Chloride 02/18/2023 97 (L)  98 - 111 mmol/L Final   CO2 02/18/2023 25  22 - 32 mmol/L Final   Glucose, Bld 02/18/2023 125 (H)  70 - 99 mg/dL Final   Glucose reference range applies only to samples taken after fasting for at least 8 hours.   BUN 02/18/2023 11  6 - 20 mg/dL Final   Creatinine, Ser 02/18/2023 0.91  0.61 - 1.24 mg/dL Final   Calcium 78/46/9629 9.2  8.9 - 10.3 mg/dL Final   Total Protein 52/84/1324 6.9  6.5 - 8.1 g/dL Final   Albumin 40/10/2723 3.7  3.5 - 5.0 g/dL Final   AST 36/64/4034 36  15 - 41 U/L Final   ALT 02/18/2023 20  0 - 44 U/L Final   Alkaline Phosphatase 02/18/2023 52  38 - 126 U/L Final   Total Bilirubin 02/18/2023  0.7  0.0 - 1.2 mg/dL Final   GFR, Estimated 02/18/2023 >60  >60 mL/min Final   Comment: (NOTE) Calculated using the CKD-EPI Creatinine Equation (2021)    Anion gap 02/18/2023 15  5 - 15 Final   Performed at [facility], [address], [place], [place] [address]   Alcohol , Ethyl (B) 02/18/2023 16 (H)  <10 mg/dL Final   Comment: (NOTE) Lowest detectable limit for serum alcohol  is 10 mg/dL.  For medical purposes only. Performed at [facility], [address], [place], [place] [address]    WBC 02/18/2023 9.5  4.0 - 10.5 K/uL Final   RBC 02/18/2023 5.09  4.22 - 5.81 MIL/uL Final   Hemoglobin 02/18/2023 14.8  13.0 - 17.0 g/dL Final   HCT 27/25/3664 45.4  39.0 - 52.0 % Final   MCV 02/18/2023 89.2  80.0 - 100.0 fL Final   MCH 02/18/2023 29.1  26.0 - 34.0 pg Final   MCHC 02/18/2023 32.6  30.0 - 36.0 g/dL Final   RDW 40/34/7425 13.6  11.5 - 15.5 % Final   Platelets 02/18/2023 399  150 - 400 K/uL Final   nRBC 02/18/2023 0.0  0.0 - 0.2 % Final   Performed at  [facility], [address] [place], [place] [address]   Opiates 02/18/2023 NONE DETECTED  NONE DETECTED Final   Cocaine 02/18/2023 POSITIVE (A)  NONE DETECTED Final   Benzodiazepines 02/18/2023 NONE DETECTED  NONE DETECTED Final   Amphetamines 02/18/2023 NONE DETECTED  NONE DETECTED Final   Tetrahydrocannabinol 02/18/2023 NONE DETECTED  NONE DETECTED Final   Barbiturates 02/18/2023 NONE DETECTED  NONE DETECTED Final   Comment: (NOTE) DRUG SCREEN FOR MEDICAL PURPOSES ONLY.  IF CONFIRMATION IS NEEDED FOR ANY PURPOSE, NOTIFY LAB WITHIN 5 DAYS.  LOWEST DETECTABLE LIMITS FOR URINE DRUG SCREEN Drug Class                     Cutoff (ng/mL) Amphetamine and metabolites    1000 Barbiturate and metabolites    200 Benzodiazepine                 200 Opiates and metabolites        300 Cocaine and metabolites        300 THC                            50 Performed at [facility], [address] [place], [place] [address]    SARS Coronavirus 2 by RT PCR 02/18/2023 NEGATIVE  NEGATIVE Final   Influenza A by PCR 02/18/2023 NEGATIVE  NEGATIVE Final   Influenza B by PCR 02/18/2023 NEGATIVE  NEGATIVE Final   Comment: (NOTE) The Xpert Xpress SARS-CoV-2/FLU/RSV plus assay is intended as an aid in the diagnosis of influenza from Nasopharyngeal swab specimens and should not be used as a sole basis for treatment. Nasal washings and aspirates are unacceptable for Xpert Xpress SARS-CoV-2/FLU/RSV testing.  Fact Sheet for Patients: BloggerCourse.com  Fact Sheet for Healthcare Providers: SeriousBroker.it  This test is not yet approved or cleared by the [place]  FDA and has been authorized for detection and/or diagnosis of SARS-CoV-2 by FDA under an Emergency Use Authorization (EUA). This EUA will remain in effect (meaning this test can be used) for the duration of the COVID-19 declaration under Section 564(b)(1) of the Act, 21  U.S.C. section 360bbb-3(b)(1), unless the authorization is terminated or revoked.  Resp Syncytial Virus by PCR 02/18/2023 NEGATIVE  NEGATIVE Final   Comment: (NOTE) Fact Sheet for Patients: BloggerCourse.com  Fact Sheet for Healthcare Providers: SeriousBroker.it  This test is not yet approved or cleared by the [place]  FDA and has been authorized for detection and/or diagnosis of SARS-CoV-2 by FDA under an Emergency Use Authorization (EUA). This EUA will remain in effect (meaning this test can be used) for the duration of the COVID-19 declaration under Section 564(b)(1) of the Act, 21 U.S.C. section 360bbb-3(b)(1), unless the authorization is terminated or revoked.  Performed at [facility], [address], [place], [place] [address]     Allergies: Patient has no known allergies.  Medications:  Facility Ordered Medications  Medication   [START ON 06/09/2023] ARIPiprazole (ABILIFY) tablet 10 mg   thiamine  (VITAMIN B1) injection 100 mg   [START ON 06/09/2023] thiamine  (VITAMIN B1) tablet 100 mg   multivitamin with minerals tablet 1 tablet   LORazepam  (ATIVAN ) tablet 1 mg   hydrOXYzine  (ATARAX ) tablet 25 mg   loperamide  (IMODIUM ) capsule 2-4 mg   ondansetron  (ZOFRAN -ODT) disintegrating tablet 4 mg   LORazepam  (ATIVAN ) tablet 1 mg   Followed by   Cecily Cohen ON 06/09/2023] LORazepam  (ATIVAN ) tablet 1 mg   Followed by   Cecily Cohen ON 06/10/2023] LORazepam  (ATIVAN ) tablet 1 mg   Followed by   Cecily Cohen ON 06/12/2023] LORazepam  (ATIVAN ) tablet 1 mg   FLUoxetine  (PROZAC ) capsule 10 mg   PTA Medications  Medication Sig   FLUoxetine  (PROZAC ) 10 MG capsule Take 3 capsules (30 mg total) by mouth daily.   albuterol  (VENTOLIN  HFA) 108 (90 Base) MCG/ACT inhaler Inhale 2 puffs into the lungs every 6 (six) hours as needed for wheezing or shortness of breath.   ipratropium-albuterol  (DUONEB) 0.5-2.5 (3) MG/3ML SOLN Take 3 mLs by nebulization  every 6 (six) hours as needed. (Patient taking differently: Take 3 mLs by nebulization every 6 (six) hours as needed (For shortness of breath).)   fluticasone  furoate-vilanterol (BREO ELLIPTA ) 100-25 MCG/ACT AEPB Inhale 1 puff into the lungs daily.    Long Term Goals: Improvement in symptoms so as ready for discharge  Short Term Goals: Patient will verbalize feelings in meetings with treatment team members., Patient will attend at least of 50% of the groups daily., Pt will complete the PHQ9 on admission, day 3 and discharge., Patient will participate in completing the [place] Suicide Severity Rating Scale, Patient will score a low risk of violence for 24 hours prior to discharge, and Patient will take medications as prescribed daily.  Medical Decision Making  Cocaine and alcohol  use d/o with uncomplicated withdrawal: Continue Ativan  detox protocol  Depression: Abilify 10 mg daily  Prozac  10 mg daily  Anxiety: Hydroxyzine  25 mg every six hours PRN  Insomnia: Trazodone  50 mg at bedtime PRN    Recommendations  Based on my evaluation the patient does not appear to have an emergency medical condition.  Roslynn Coombes, NP 06/08/23  4:39 PM

## 2023-06-09 DIAGNOSIS — F32A Depression, unspecified: Secondary | ICD-10-CM | POA: Diagnosis not present

## 2023-06-09 DIAGNOSIS — F1424 Cocaine dependence with cocaine-induced mood disorder: Secondary | ICD-10-CM | POA: Diagnosis not present

## 2023-06-09 DIAGNOSIS — F17210 Nicotine dependence, cigarettes, uncomplicated: Secondary | ICD-10-CM | POA: Diagnosis not present

## 2023-06-09 DIAGNOSIS — Z5900 Homelessness unspecified: Secondary | ICD-10-CM | POA: Diagnosis not present

## 2023-06-09 MED ORDER — FLUOXETINE HCL 20 MG PO CAPS
20.0000 mg | ORAL_CAPSULE | Freq: Every day | ORAL | Status: DC
  Administered 2023-06-09 – 2023-06-14 (×6): 20 mg via ORAL
  Filled 2023-06-09 (×6): qty 1

## 2023-06-09 MED ORDER — IBUPROFEN 600 MG PO TABS
600.0000 mg | ORAL_TABLET | Freq: Four times a day (QID) | ORAL | Status: DC | PRN
  Filled 2023-06-09: qty 1

## 2023-06-09 MED ORDER — GABAPENTIN 300 MG PO CAPS
300.0000 mg | ORAL_CAPSULE | Freq: Three times a day (TID) | ORAL | Status: DC
  Administered 2023-06-09 – 2023-06-14 (×16): 300 mg via ORAL
  Filled 2023-06-09 (×16): qty 1

## 2023-06-09 NOTE — Group Note (Signed)
 Group Topic: Relaxation  Group Date: 06/09/2023 Start Time: 0800 End Time: 1610 Facilitators: Lestine Rathke, NT  Department: [facility]  Number of Participants: 5  Group Focus: coping skills Treatment Modality:  Behavior Modification Therapy and Cognitive Behavioral Therapy Interventions utilized were assignment, clarification, and group exercise Purpose: enhance coping skills and increase insight  Name: Franklin [name] Date of Birth: [birth year]  MR: [id number]    Level of Participation: Pt did not attend meeting Quality of Participation: n/a Interactions with others: n/a Mood/Affect: n/a Triggers (if applicable): n/a Cognition: n/a Progress: None Response: n/a Plan: patient will be encouraged to attend all scheduled activities.  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

## 2023-06-09 NOTE — ED Notes (Signed)
 Patient is sleeping. Respirations equal and unlabored, skin warm and dry. No change in assessment or acuity. Routine safety checks conducted according to facility protocol. Will continue to monitor for safety.

## 2023-06-09 NOTE — ED Notes (Signed)
 Pt has kept low profile on unit this shift. OOB for meals and meds.  Fidgety movements at times.   Behaviors appropriate.    Q 15  minute observations for safety continue

## 2023-06-09 NOTE — ED Notes (Signed)
 Patient observed/assessed at bedside lying in bed asleep. Patient alert and oriented to self and location. Affect is guarded. Patient denies pain and anxiety. He denies A/V/H. He denies having any thoughts/plan of self harm and harm towards others. Fluid and snack offered. Patient states that appetite has been good throughout the day. Verbalizes no further complaints at this time. Will continue to monitor and support.

## 2023-06-09 NOTE — ED Notes (Signed)
 Pt OOB for lunch.  Present for nutrition group.   Pt returned to room.  And has been observed resting in bed.  Eyes closed. Respirations even and non labored.  NAD Q 15 minute observations for safety continue.

## 2023-06-09 NOTE — ED Notes (Signed)
Patient is sleeping. Respirations equal and unlabored, skin warm and dry, NAD. No change in assessment or acuity. Routine safety checks conducted according to facility protocol. Will continue to monitor for safety.   

## 2023-06-09 NOTE — ED Provider Notes (Signed)
 Behavioral Health Progress Note  Date and Time: 06/09/2023 9:24 AM Name: Franklin [name] MRN:  [id number]  Subjective:  The notes, vital signs, and patient reviewed with the MD prior to the assessment.  Franklin [name] was lying quietly on his bed and reported his sleep was "pretty good" last night, just complained of back "disc" pain to which gabapentin  has helped in the past when asked, order placed with PRN ibuprofen .  Denied withdrawal symptoms.  Depression and anxiety are "pretty bad", no suicidal ideations.  No issues tolerating his medications.  Diagnosis:  Final diagnoses:  Cocaine use disorder, severe, dependence (HCC)    Total Time spent with patient: 20 minutes  Past Psychiatric History: depression, cocaine and alcohol  dependence, anxiety Past Medical History: COPD Family History: none Family Psychiatric  History: none Social History: houseless   Sleep: Good  Appetite:  Good  Current Medications:  Current Facility-Administered Medications  Medication Dose Route Frequency Provider Last Rate Last Admin   ARIPiprazole (ABILIFY) tablet 10 mg  10 mg Oral Daily McLauchlin, Angela, NP       FLUoxetine  (PROZAC ) capsule 10 mg  10 mg Oral Daily George Kinder, Jacyln Carmer Y, NP   10 mg at 06/08/23 1716   hydrOXYzine  (ATARAX ) tablet 25 mg  25 mg Oral Q6H PRN McLauchlin, Angela, NP       loperamide  (IMODIUM ) capsule 2-4 mg  2-4 mg Oral PRN McLauchlin, Angela, NP       multivitamin with minerals tablet 1 tablet  1 tablet Oral Daily McLauchlin, Angela, NP   1 tablet at 06/08/23 1715   ondansetron  (ZOFRAN -ODT) disintegrating tablet 4 mg  4 mg Oral Q6H PRN McLauchlin, Angela, NP       thiamine  (VITAMIN B1) tablet 100 mg  100 mg Oral Daily McLauchlin, Angela, NP       traZODone  (DESYREL ) tablet 50 mg  50 mg Oral QHS PRN Rock Sobol Y, NP   50 mg at 06/08/23 2122   Current Outpatient Medications  Medication Sig Dispense Refill   albuterol  (VENTOLIN  HFA) 108 (90 Base) MCG/ACT inhaler Inhale 2 puffs into the  lungs every 6 (six) hours as needed for wheezing or shortness of breath. 8 g 0   FLUoxetine  (PROZAC ) 10 MG capsule Take 3 capsules (30 mg total) by mouth daily. 90 capsule 0   fluticasone  furoate-vilanterol (BREO ELLIPTA ) 100-25 MCG/ACT AEPB Inhale 1 puff into the lungs daily. 60 each 0   ipratropium-albuterol  (DUONEB) 0.5-2.5 (3) MG/3ML SOLN Take 3 mLs by nebulization every 6 (six) hours as needed. (Patient taking differently: Take 3 mLs by nebulization every 6 (six) hours as needed (For shortness of breath).) 360 mL 0    Labs  Lab Results:  Admission on 06/07/2023, Discharged on 06/08/2023  Component Date Value Ref Range Status   WBC 06/07/2023 6.9  4.0 - 10.5 K/uL Final   RBC 06/07/2023 4.11 (L)  4.22 - 5.81 MIL/uL Final   Hemoglobin 06/07/2023 11.8 (L)  13.0 - 17.0 g/dL Final   HCT 01/06/7251 35.9 (L)  39.0 - 52.0 % Final   MCV 06/07/2023 87.3  80.0 - 100.0 fL Final   MCH 06/07/2023 28.7  26.0 - 34.0 pg Final   MCHC 06/07/2023 32.9  30.0 - 36.0 g/dL Final   RDW 66/44/0347 15.1  11.5 - 15.5 % Final   Platelets 06/07/2023 249  150 - 400 K/uL Final   nRBC 06/07/2023 0.0  0.0 - 0.2 % Final   Neutrophils Relative % 06/07/2023 66  % Final   Neutro  Abs 06/07/2023 4.6  1.7 - 7.7 K/uL Final   Lymphocytes Relative 06/07/2023 22  % Final   Lymphs Abs 06/07/2023 1.5  0.7 - 4.0 K/uL Final   Monocytes Relative 06/07/2023 9  % Final   Monocytes Absolute 06/07/2023 0.6  0.1 - 1.0 K/uL Final   Eosinophils Relative 06/07/2023 2  % Final   Eosinophils Absolute 06/07/2023 0.1  0.0 - 0.5 K/uL Final   Basophils Relative 06/07/2023 1  % Final   Basophils Absolute 06/07/2023 0.0  0.0 - 0.1 K/uL Final   Immature Granulocytes 06/07/2023 0  % Final   Abs Immature Granulocytes 06/07/2023 0.03  0.00 - 0.07 K/uL Final   Performed at [facility], [address] [place], [place] [address]   Sodium 06/07/2023 138  135 - 145 mmol/L Final   Potassium 06/07/2023 3.7  3.5 - 5.1 mmol/L Final   Chloride  06/07/2023 104  98 - 111 mmol/L Final   CO2 06/07/2023 24  22 - 32 mmol/L Final   Glucose, Bld 06/07/2023 154 (H)  70 - 99 mg/dL Final   Glucose reference range applies only to samples taken after fasting for at least 8 hours.   BUN 06/07/2023 14  6 - 20 mg/dL Final   Creatinine, Ser 06/07/2023 0.88  0.61 - 1.24 mg/dL Final   Calcium 91/47/8295 9.3  8.9 - 10.3 mg/dL Final   Total Protein 62/13/0865 6.2 (L)  6.5 - 8.1 g/dL Final   Albumin 78/46/9629 3.7  3.5 - 5.0 g/dL Final   AST 52/84/1324 29  15 - 41 U/L Final   ALT 06/07/2023 20  0 - 44 U/L Final   Alkaline Phosphatase 06/07/2023 62  38 - 126 U/L Final   Total Bilirubin 06/07/2023 2.0 (H)  0.0 - 1.2 mg/dL Final   GFR, Estimated 06/07/2023 >60  >60 mL/min Final   Comment: (NOTE) Calculated using the CKD-EPI Creatinine Equation (2021)    Anion gap 06/07/2023 10  5 - 15 Final   Performed at [facility], [address] [place], [place] [address]   Salicylate Lvl 06/07/2023 <7.0 (L)  7.0 - 30.0 mg/dL Final   Performed at [facility], [address] [place], [place] [address]   Acetaminophen  (Tylenol ), Serum 06/07/2023 <10 (L)  10 - 30 ug/mL Final   Comment: (NOTE) Therapeutic concentrations vary significantly. A range of 10-30 ug/mL  may be an effective concentration for many patients. However, some  are best treated at concentrations outside of this range. Acetaminophen  concentrations >150 ug/mL at 4 hours after ingestion  and >50 ug/mL at 12 hours after ingestion are often associated with  toxic reactions.  Performed at [facility], [address] [place], [place] [address]    Alcohol , Ethyl (B) 06/07/2023 <15  <15 mg/dL Final   Comment: (NOTE) For medical purposes only. Performed at [facility], [address] [place], [place] [address]    Tricyclic, Ur Screen 06/08/2023 NONE DETECTED  NONE DETECTED Final   Amphetamines, Ur Screen 06/08/2023 NONE DETECTED  NONE DETECTED Final    MDMA (Ecstasy)Ur Screen 06/08/2023 NONE DETECTED  NONE DETECTED Final   Cocaine Metabolite,Ur [place] 06/08/2023 POSITIVE (A)  NONE DETECTED Final   Opiate, Ur Screen 06/08/2023 NONE DETECTED  NONE DETECTED Final   Phencyclidine (PCP) Ur S 06/08/2023 NONE DETECTED  NONE DETECTED Final   Cannabinoid 50 Ng, Ur [place] 06/08/2023 NONE DETECTED  NONE DETECTED Final   Barbiturates, Ur Screen 06/08/2023 NONE DETECTED  NONE  DETECTED Final   Benzodiazepine, Ur Scrn 06/08/2023 POSITIVE (A)  NONE DETECTED Final   Methadone Scn, Ur 06/08/2023 NONE DETECTED  NONE DETECTED Final   Comment: (NOTE) Tricyclics + metabolites, urine    Cutoff 1000 ng/mL Amphetamines + metabolites, urine  Cutoff 1000 ng/mL MDMA (Ecstasy), urine              Cutoff 500 ng/mL Cocaine Metabolite, urine          Cutoff 300 ng/mL Opiate + metabolites, urine        Cutoff 300 ng/mL Phencyclidine (PCP), urine         Cutoff 25 ng/mL Cannabinoid, urine                 Cutoff 50 ng/mL Barbiturates + metabolites, urine  Cutoff 200 ng/mL Benzodiazepine, urine              Cutoff 200 ng/mL Methadone, urine                   Cutoff 300 ng/mL  The urine drug screen provides only a preliminary, unconfirmed analytical test result and should not be used for non-medical purposes. Clinical consideration and professional judgment should be applied to any positive drug screen result due to possible interfering substances. A more specific alternate chemical method must be used in order to obtain a confirmed analytical result. Gas chromatography / mass spectrometry (GC/MS) is the preferred confirm                          atory method. Performed at [facility], [address] [place], [place] [address]   Admission on 05/16/2023, Discharged on 05/17/2023  Component Date Value Ref Range Status   WBC 05/16/2023 8.2  4.0 - 10.5 K/uL Final   RBC 05/16/2023 4.86  4.22 - 5.81 MIL/uL Final   Hemoglobin 05/16/2023 14.3  13.0 - 17.0 g/dL Final    HCT 16/01/930 43.8  39.0 - 52.0 % Final   MCV 05/16/2023 90.1  80.0 - 100.0 fL Final   MCH 05/16/2023 29.4  26.0 - 34.0 pg Final   MCHC 05/16/2023 32.6  30.0 - 36.0 g/dL Final   RDW 35/57/3220 15.2  11.5 - 15.5 % Final   Platelets 05/16/2023 348  150 - 400 K/uL Final   nRBC 05/16/2023 0.0  0.0 - 0.2 % Final   Neutrophils Relative % 05/16/2023 61  % Final   Neutro Abs 05/16/2023 5.2  1.7 - 7.7 K/uL Final   Lymphocytes Relative 05/16/2023 30  % Final   Lymphs Abs 05/16/2023 2.4  0.7 - 4.0 K/uL Final   Monocytes Relative 05/16/2023 5  % Final   Monocytes Absolute 05/16/2023 0.4  0.1 - 1.0 K/uL Final   Eosinophils Relative 05/16/2023 3  % Final   Eosinophils Absolute 05/16/2023 0.2  0.0 - 0.5 K/uL Final   Basophils Relative 05/16/2023 1  % Final   Basophils Absolute 05/16/2023 0.1  0.0 - 0.1 K/uL Final   Immature Granulocytes 05/16/2023 0  % Final   Abs Immature Granulocytes 05/16/2023 0.02  0.00 - 0.07 K/uL Final   Performed at [facility], [address] [place], [place] [address]   Sodium 05/16/2023 139  135 - 145 mmol/L Final   Potassium 05/16/2023 3.2 (L)  3.5 - 5.1 mmol/L Final   Chloride 05/16/2023 102  98 - 111 mmol/L Final   CO2 05/16/2023 24  22 - 32 mmol/L Final  Glucose, Bld 05/16/2023 125 (H)  70 - 99 mg/dL Final   Glucose reference range applies only to samples taken after fasting for at least 8 hours.   BUN 05/16/2023 7  6 - 20 mg/dL Final   Creatinine, Ser 05/16/2023 1.08  0.61 - 1.24 mg/dL Final   Calcium 16/10/9602 9.1  8.9 - 10.3 mg/dL Final   GFR, Estimated 05/16/2023 >60  >60 mL/min Final   Comment: (NOTE) Calculated using the CKD-EPI Creatinine Equation (2021)    Anion gap 05/16/2023 13  5 - 15 Final   Performed at [facility], [address] [place], [place] [address]   Troponin I (High Sensitivity) 05/16/2023 6  <18 ng/L Final   Comment: (NOTE) Elevated high sensitivity troponin I (hsTnI) values and significant  changes across serial  measurements may suggest ACS but many other  chronic and acute conditions are known to elevate hsTnI results.  Refer to the "Links" section for chest pain algorithms and additional  guidance. Performed at [facility], [address] [place], [place] [address]    SARS Coronavirus 2 by RT PCR 05/16/2023 NEGATIVE  NEGATIVE Final   Comment: (NOTE) SARS-CoV-2 target nucleic acids are NOT DETECTED.  The SARS-CoV-2 RNA is generally detectable in upper respiratory specimens during the acute phase of infection. The lowest concentration of SARS-CoV-2 viral copies this assay can detect is 138 copies/mL. A negative result does not preclude SARS-Cov-2 infection and should not be used as the sole basis for treatment or other patient management decisions. A negative result may occur with  improper specimen collection/handling, submission of specimen other than nasopharyngeal swab, presence of viral mutation(s) within the areas targeted by this assay, and inadequate number of viral copies(<138 copies/mL). A negative result must be combined with clinical observations, patient history, and epidemiological information. The expected result is Negative.  Fact Sheet for Patients:  BloggerCourse.com  Fact Sheet for Healthcare Providers:  SeriousBroker.it  This test is no                          t yet approved or cleared by the [place]  FDA and  has been authorized for detection and/or diagnosis of SARS-CoV-2 by FDA under an Emergency Use Authorization (EUA). This EUA will remain  in effect (meaning this test can be used) for the duration of the COVID-19 declaration under Section 564(b)(1) of the Act, 21 U.S.C.section 360bbb-3(b)(1), unless the authorization is terminated  or revoked sooner.       Influenza A by PCR 05/16/2023 NEGATIVE  NEGATIVE Final   Influenza B by PCR 05/16/2023 NEGATIVE  NEGATIVE Final   Comment: (NOTE) The  Xpert Xpress SARS-CoV-2/FLU/RSV plus assay is intended as an aid in the diagnosis of influenza from Nasopharyngeal swab specimens and should not be used as a sole basis for treatment. Nasal washings and aspirates are unacceptable for Xpert Xpress SARS-CoV-2/FLU/RSV testing.  Fact Sheet for Patients: BloggerCourse.com  Fact Sheet for Healthcare Providers: SeriousBroker.it  This test is not yet approved or cleared by the [place]  FDA and has been authorized for detection and/or diagnosis of SARS-CoV-2 by FDA under an Emergency Use Authorization (EUA). This EUA will remain in effect (meaning this test can be used) for the duration of the COVID-19 declaration under Section 564(b)(1) of the Act, 21 U.S.C. section 360bbb-3(b)(1), unless the authorization is terminated or revoked.     Resp Syncytial Virus by PCR 05/16/2023 NEGATIVE  NEGATIVE Final   Comment: (  NOTE) Fact Sheet for Patients: BloggerCourse.com  Fact Sheet for Healthcare Providers: SeriousBroker.it  This test is not yet approved or cleared by the [place]  FDA and has been authorized for detection and/or diagnosis of SARS-CoV-2 by FDA under an Emergency Use Authorization (EUA). This EUA will remain in effect (meaning this test can be used) for the duration of the COVID-19 declaration under Section 564(b)(1) of the Act, 21 U.S.C. section 360bbb-3(b)(1), unless the authorization is terminated or revoked.  Performed at [facility], [address] [place], [place] [address]    pH, Ven 05/16/2023 7.38  7.25 - 7.43 Final   pCO2, Ven 05/16/2023 44  44 - 60 mmHg Final   pO2, Ven 05/16/2023 96 (H)  32 - 45 mmHg Final   Bicarbonate 05/16/2023 26.0  20.0 - 28.0 mmol/L Final   Acid-Base Excess 05/16/2023 0.5  0.0 - 2.0 mmol/L Final   O2 Saturation 05/16/2023 98.7  % Final   Patient temperature 05/16/2023 37.0    Final   Collection site 05/16/2023 VEIN   Final   Performed at [facility], [address] [place], [place] [address]   Troponin I (High Sensitivity) 05/16/2023 5  <18 ng/L Final   Comment: (NOTE) Elevated high sensitivity troponin I (hsTnI) values and significant  changes across serial measurements may suggest ACS but many other  chronic and acute conditions are known to elevate hsTnI results.  Refer to the "Links" section for chest pain algorithms and additional  guidance. Performed at [facility], [address] [place], [place] [address]    Tricyclic, Ur Screen 05/16/2023 NONE DETECTED  NONE DETECTED Final   Amphetamines, Ur Screen 05/16/2023 POSITIVE (A)  NONE DETECTED Final   Comment: (NOTE) Trazodone  is metabolized in vivo to several metabolites, including pharmacologically active m-CPP, which is excreted in the urine. Immunoassay screens for amphetamines and MDMA have potential cross-reactivity with these compounds and may provide false positive  results.     MDMA (Ecstasy)Ur Screen 05/16/2023 NONE DETECTED  NONE DETECTED Final   Cocaine Metabolite,Ur  05/16/2023 POSITIVE (A)  NONE DETECTED Final   Opiate, Ur Screen 05/16/2023 NONE DETECTED  NONE DETECTED Final   Phencyclidine (PCP) Ur S 05/16/2023 NONE DETECTED  NONE DETECTED Final   Cannabinoid 50 Ng, Ur  05/16/2023 NONE DETECTED  NONE DETECTED Final   Barbiturates, Ur Screen 05/16/2023 NONE DETECTED  NONE DETECTED Final   Benzodiazepine, Ur Scrn 05/16/2023 NONE DETECTED  NONE DETECTED Final   Methadone Scn, Ur 05/16/2023 NONE DETECTED  NONE DETECTED Final   Comment: (NOTE) Tricyclics + metabolites, urine    Cutoff 1000 ng/mL Amphetamines + metabolites, urine  Cutoff 1000 ng/mL MDMA (Ecstasy), urine              Cutoff 500 ng/mL Cocaine Metabolite, urine          Cutoff 300 ng/mL Opiate + metabolites, urine        Cutoff 300 ng/mL Phencyclidine (PCP), urine         Cutoff 25  ng/mL Cannabinoid, urine                 Cutoff 50 ng/mL Barbiturates + metabolites, urine  Cutoff 200 ng/mL Benzodiazepine, urine              Cutoff 200 ng/mL Methadone, urine                   Cutoff 300 ng/mL  The urine drug screen provides only a preliminary,  unconfirmed analytical test result and should not be used for non-medical purposes. Clinical consideration and professional judgment should be applied to any positive drug screen result due to possible interfering substances. A more specific alternate chemical method must be used in order to obtain a confirmed analytical result. Gas chromatography / mass spectrometry (GC/MS) is the preferred confirm                          atory method. Performed at [facility], [address] [place], [place] [address]    Sodium 05/17/2023 136  135 - 145 mmol/L Final   Potassium 05/17/2023 3.7  3.5 - 5.1 mmol/L Final   Chloride 05/17/2023 104  98 - 111 mmol/L Final   CO2 05/17/2023 25  22 - 32 mmol/L Final   Glucose, Bld 05/17/2023 124 (H)  70 - 99 mg/dL Final   Glucose reference range applies only to samples taken after fasting for at least 8 hours.   BUN 05/17/2023 12  6 - 20 mg/dL Final   Creatinine, Ser 05/17/2023 0.92  0.61 - 1.24 mg/dL Final   Calcium 60/45/4098 8.6 (L)  8.9 - 10.3 mg/dL Final   GFR, Estimated 05/17/2023 >60  >60 mL/min Final   Comment: (NOTE) Calculated using the CKD-EPI Creatinine Equation (2021)    Anion gap 05/17/2023 7  5 - 15 Final   Performed at [facility], [address] [place], [place] [address]   Magnesium  05/17/2023 2.1  1.7 - 2.4 mg/dL Final   Performed at [facility], [address] [place], [place] [address]   WBC 05/17/2023 10.4  4.0 - 10.5 K/uL Final   RBC 05/17/2023 4.30  4.22 - 5.81 MIL/uL Final   Hemoglobin 05/17/2023 12.6 (L)  13.0 - 17.0 g/dL Final   HCT 62/13/0865 38.5 (L)  39.0 - 52.0 % Final   MCV 05/17/2023 89.5  80.0 - 100.0 fL Final   MCH  05/17/2023 29.3  26.0 - 34.0 pg Final   MCHC 05/17/2023 32.7  30.0 - 36.0 g/dL Final   RDW 78/46/9629 15.3  11.5 - 15.5 % Final   Platelets 05/17/2023 311  150 - 400 K/uL Final   nRBC 05/17/2023 0.0  0.0 - 0.2 % Final   Performed at [facility], [address] [place], [place] [address]  Admission on 05/09/2023, Discharged on 05/09/2023  Component Date Value Ref Range Status   SARS Coronavirus 2 by RT PCR 05/09/2023 NEGATIVE  NEGATIVE Final   Influenza A by PCR 05/09/2023 NEGATIVE  NEGATIVE Final   Influenza B by PCR 05/09/2023 NEGATIVE  NEGATIVE Final   Comment: (NOTE) The Xpert Xpress SARS-CoV-2/FLU/RSV plus assay is intended as an aid in the diagnosis of influenza from Nasopharyngeal swab specimens and should not be used as a sole basis for treatment. Nasal washings and aspirates are unacceptable for Xpert Xpress SARS-CoV-2/FLU/RSV testing.  Fact Sheet for Patients: BloggerCourse.com  Fact Sheet for Healthcare Providers: SeriousBroker.it  This test is not yet approved or cleared by the [place]  FDA and has been authorized for detection and/or diagnosis of SARS-CoV-2 by FDA under an Emergency Use Authorization (EUA). This EUA will remain in effect (meaning this test can be used) for the duration of the COVID-19 declaration under Section 564(b)(1) of the Act, 21 U.S.C. section 360bbb-3(b)(1), unless the authorization is terminated or revoked.     Resp Syncytial Virus by PCR 05/09/2023 NEGATIVE  NEGATIVE Final   Comment: (NOTE)  Fact Sheet for Patients: BloggerCourse.com  Fact Sheet for Healthcare Providers: SeriousBroker.it  This test is not yet approved or cleared by the [place]  FDA and has been authorized for detection and/or diagnosis of SARS-CoV-2 by FDA under an Emergency Use Authorization (EUA). This EUA will remain in effect (meaning this test can  be used) for the duration of the COVID-19 declaration under Section 564(b)(1) of the Act, 21 U.S.C. section 360bbb-3(b)(1), unless the authorization is terminated or revoked.  Performed at [facility], [address] [place], [place] [address]    Sodium 05/09/2023 139  135 - 145 mmol/L Final   Potassium 05/09/2023 4.1  3.5 - 5.1 mmol/L Final   Chloride 05/09/2023 102  98 - 111 mmol/L Final   CO2 05/09/2023 26  22 - 32 mmol/L Final   Glucose, Bld 05/09/2023 191 (H)  70 - 99 mg/dL Final   Glucose reference range applies only to samples taken after fasting for at least 8 hours.   BUN 05/09/2023 15  6 - 20 mg/dL Final   Creatinine, Ser 05/09/2023 1.08  0.61 - 1.24 mg/dL Final   Calcium 13/08/6576 9.1  8.9 - 10.3 mg/dL Final   Total Protein 46/96/2952 6.7  6.5 - 8.1 g/dL Final   Albumin 84/13/2440 3.8  3.5 - 5.0 g/dL Final   AST 10/31/2534 30  15 - 41 U/L Final   ALT 05/09/2023 24  0 - 44 U/L Final   Alkaline Phosphatase 05/09/2023 57  38 - 126 U/L Final   Total Bilirubin 05/09/2023 1.1  0.0 - 1.2 mg/dL Final   GFR, Estimated 05/09/2023 >60  >60 mL/min Final   Comment: (NOTE) Calculated using the CKD-EPI Creatinine Equation (2021)    Anion gap 05/09/2023 11  5 - 15 Final   Performed at [facility], [address], [place], [place] [address]   WBC 05/09/2023 10.4  4.0 - 10.5 K/uL Final   RBC 05/09/2023 5.36  4.22 - 5.81 MIL/uL Final   Hemoglobin 05/09/2023 15.5  13.0 - 17.0 g/dL Final   HCT 47/42/5956 47.1  39.0 - 52.0 % Final   MCV 05/09/2023 87.9  80.0 - 100.0 fL Final   MCH 05/09/2023 28.9  26.0 - 34.0 pg Final   MCHC 05/09/2023 32.9  30.0 - 36.0 g/dL Final   RDW 38/75/6433 14.9  11.5 - 15.5 % Final   Platelets 05/09/2023 369  150 - 400 K/uL Final   nRBC 05/09/2023 0.0  0.0 - 0.2 % Final   Performed at [facility], [address] [place], [place] [address]   pH, Ven 05/09/2023 7.4  7.25 - 7.43 Final   pCO2, Ven 05/09/2023 47  44 - 60 mmHg Final   pO2, Ven  05/09/2023 89 (H)  32 - 45 mmHg Final   Bicarbonate 05/09/2023 29.1 (H)  20.0 - 28.0 mmol/L Final   Acid-Base Excess 05/09/2023 3.5 (H)  0.0 - 2.0 mmol/L Final   O2 Saturation 05/09/2023 98.8  % Final   Patient temperature 05/09/2023 37.0   Final   Collection site 05/09/2023 LEFT ANTECUBITAL   Final   IV   Drawn by 05/09/2023 DRAWN BY RN   Final   Performed at [facility], [address] [place], [place] [address]  Admission on 05/04/2023, Discharged on 05/07/2023  Component Date Value Ref Range Status   WBC 05/04/2023 10.0  4.0 - 10.5 K/uL Final   RBC 05/04/2023 5.15  4.22 - 5.81 MIL/uL  Final   Hemoglobin 05/04/2023 14.9  13.0 - 17.0 g/dL Final   HCT 25/36/6440 46.2  39.0 - 52.0 % Final   MCV 05/04/2023 89.7  80.0 - 100.0 fL Final   MCH 05/04/2023 28.9  26.0 - 34.0 pg Final   MCHC 05/04/2023 32.3  30.0 - 36.0 g/dL Final   RDW 34/74/2595 14.8  11.5 - 15.5 % Final   Platelets 05/04/2023 342  150 - 400 K/uL Final   nRBC 05/04/2023 0.0  0.0 - 0.2 % Final   Neutrophils Relative % 05/04/2023 58  % Final   Neutro Abs 05/04/2023 5.8  1.7 - 7.7 K/uL Final   Lymphocytes Relative 05/04/2023 29  % Final   Lymphs Abs 05/04/2023 2.9  0.7 - 4.0 K/uL Final   Monocytes Relative 05/04/2023 5  % Final   Monocytes Absolute 05/04/2023 0.5  0.1 - 1.0 K/uL Final   Eosinophils Relative 05/04/2023 7  % Final   Eosinophils Absolute 05/04/2023 0.7 (H)  0.0 - 0.5 K/uL Final   Basophils Relative 05/04/2023 1  % Final   Basophils Absolute 05/04/2023 0.1  0.0 - 0.1 K/uL Final   Immature Granulocytes 05/04/2023 0  % Final   Abs Immature Granulocytes 05/04/2023 0.03  0.00 - 0.07 K/uL Final   Performed at [facility], [address] [place], [place] [address]   Sodium 05/04/2023 139  135 - 145 mmol/L Final   Potassium 05/04/2023 3.8  3.5 - 5.1 mmol/L Final   Chloride 05/04/2023 106  98 - 111 mmol/L Final   CO2 05/04/2023 25  22 - 32 mmol/L Final   Glucose, Bld 05/04/2023 138 (H)  70 - 99 mg/dL  Final   Glucose reference range applies only to samples taken after fasting for at least 8 hours.   BUN 05/04/2023 10  6 - 20 mg/dL Final   Creatinine, Ser 05/04/2023 0.90  0.61 - 1.24 mg/dL Final   Calcium 64/33/2951 9.0  8.9 - 10.3 mg/dL Final   GFR, Estimated 05/04/2023 >60  >60 mL/min Final   Comment: (NOTE) Calculated using the CKD-EPI Creatinine Equation (2021)    Anion gap 05/04/2023 8  5 - 15 Final   Performed at [facility], [address], [place], [place] [address]   pH, Ven 05/04/2023 7.32  7.25 - 7.43 Final   pCO2, Ven 05/04/2023 60  44 - 60 mmHg Final   pO2, Ven 05/04/2023 42  32 - 45 mmHg Final   Bicarbonate 05/04/2023 30.7 (H)  20.0 - 28.0 mmol/L Final   Acid-Base Excess 05/04/2023 2.8 (H)  0.0 - 2.0 mmol/L Final   O2 Saturation 05/04/2023 72.7  % Final   Patient temperature 05/04/2023 36.5   Final   Performed at [facility], [address], [place], [place] [address]   SARS Coronavirus 2 by RT PCR 05/04/2023 NEGATIVE  NEGATIVE Final   Influenza A by PCR 05/04/2023 NEGATIVE  NEGATIVE Final   Influenza B by PCR 05/04/2023 NEGATIVE  NEGATIVE Final   Comment: (NOTE) The Xpert Xpress SARS-CoV-2/FLU/RSV plus assay is intended as an aid in the diagnosis of influenza from Nasopharyngeal swab specimens and should not be used as a sole basis for treatment. Nasal washings and aspirates are unacceptable for Xpert Xpress SARS-CoV-2/FLU/RSV testing.  Fact Sheet for Patients: BloggerCourse.com  Fact Sheet for Healthcare Providers: SeriousBroker.it  This test is not yet approved or cleared by the [place]  FDA and has been authorized for detection and/or diagnosis of  SARS-CoV-2 by FDA under an Emergency Use Authorization (EUA). This EUA will remain in effect (meaning this test can be used) for the duration of the COVID-19 declaration under Section 564(b)(1) of the Act, 21 U.S.C. section 360bbb-3(b)(1), unless  the authorization is terminated or revoked.     Resp Syncytial Virus by PCR 05/04/2023 NEGATIVE  NEGATIVE Final   Comment: (NOTE) Fact Sheet for Patients: BloggerCourse.com  Fact Sheet for Healthcare Providers: SeriousBroker.it  This test is not yet approved or cleared by the [place]  FDA and has been authorized for detection and/or diagnosis of SARS-CoV-2 by FDA under an Emergency Use Authorization (EUA). This EUA will remain in effect (meaning this test can be used) for the duration of the COVID-19 declaration under Section 564(b)(1) of the Act, 21 U.S.C. section 360bbb-3(b)(1), unless the authorization is terminated or revoked.  Performed at [facility], [address], [place], [place] [address]    Neisseria Gonorrhea 05/05/2023 Negative   Final   Chlamydia 05/05/2023 Negative   Final   Comment 05/05/2023 Normal Reference Ranger Chlamydia - Negative   Final   Comment 05/05/2023 Normal Reference Range Neisseria Gonorrhea - Negative   Final   HIV Screen 4th Generation wRfx 05/05/2023 Non Reactive  Non Reactive Final   Performed at [facility], [address], [place], [place] [address]   Sodium 05/06/2023 139  135 - 145 mmol/L Final   Potassium 05/06/2023 3.8  3.5 - 5.1 mmol/L Final   Chloride 05/06/2023 107  98 - 111 mmol/L Final   CO2 05/06/2023 23  22 - 32 mmol/L Final   Glucose, Bld 05/06/2023 169 (H)  70 - 99 mg/dL Final   Glucose reference range applies only to samples taken after fasting for at least 8 hours.   BUN 05/06/2023 10  6 - 20 mg/dL Final   Creatinine, Ser 05/06/2023 0.90  0.61 - 1.24 mg/dL Final   Calcium 69/62/9528 9.0  8.9 - 10.3 mg/dL Final   GFR, Estimated 05/06/2023 >60  >60 mL/min Final   Comment: (NOTE) Calculated using the CKD-EPI Creatinine Equation (2021)    Anion gap 05/06/2023 9  5 - 15 Final   Performed at [facility], [address] [place], [place] [address]   WBC  05/06/2023 15.4 (H)  4.0 - 10.5 K/uL Final   RBC 05/06/2023 4.59  4.22 - 5.81 MIL/uL Final   Hemoglobin 05/06/2023 13.2  13.0 - 17.0 g/dL Final   HCT 40/10/2723 40.3  39.0 - 52.0 % Final   MCV 05/06/2023 87.8  80.0 - 100.0 fL Final   MCH 05/06/2023 28.8  26.0 - 34.0 pg Final   MCHC 05/06/2023 32.8  30.0 - 36.0 g/dL Final   RDW 36/64/4034 15.1  11.5 - 15.5 % Final   Platelets 05/06/2023 317  150 - 400 K/uL Final   nRBC 05/06/2023 0.0  0.0 - 0.2 % Final   Performed at [facility], [address], [place], [place] [address]  Admission on 04/29/2023, Discharged on 04/29/2023  Component Date Value Ref Range Status   Sodium 04/29/2023 138  135 - 145 mmol/L Final   Potassium 04/29/2023 3.8  3.5 - 5.1 mmol/L Final   Chloride 04/29/2023 104  98 - 111 mmol/L Final   CO2 04/29/2023 22  22 - 32 mmol/L Final   Glucose, Bld 04/29/2023 101 (H)  70 - 99 mg/dL Final   Glucose reference range applies only to samples taken after fasting for at  least 8 hours.   BUN 04/29/2023 20  6 - 20 mg/dL Final   Creatinine, Ser 04/29/2023 1.11  0.61 - 1.24 mg/dL Final   Calcium 86/57/8469 9.5  8.9 - 10.3 mg/dL Final   Total Protein 62/95/2841 8.1  6.5 - 8.1 g/dL Final   Albumin 32/44/0102 4.4  3.5 - 5.0 g/dL Final   AST 72/53/6644 25  15 - 41 U/L Final   ALT 04/29/2023 19  0 - 44 U/L Final   Alkaline Phosphatase 04/29/2023 64  38 - 126 U/L Final   Total Bilirubin 04/29/2023 2.0 (H)  0.0 - 1.2 mg/dL Final   GFR, Estimated 04/29/2023 >60  >60 mL/min Final   Comment: (NOTE) Calculated using the CKD-EPI Creatinine Equation (2021)    Anion gap 04/29/2023 12  5 - 15 Final   Performed at [facility], [address] [place], [place] [address]   Troponin I (High Sensitivity) 04/29/2023 6  <18 ng/L Final   Comment: (NOTE) Elevated high sensitivity troponin I (hsTnI) values and significant  changes across serial measurements may suggest ACS but many other  chronic and acute conditions are known to  elevate hsTnI results.  Refer to the "Links" section for chest pain algorithms and additional  guidance. Performed at [facility], [address] [place], [place] [address]    WBC 04/29/2023 9.3  4.0 - 10.5 K/uL Final   RBC 04/29/2023 4.93  4.22 - 5.81 MIL/uL Final   Hemoglobin 04/29/2023 14.3  13.0 - 17.0 g/dL Final   HCT 38/75/6433 42.7  39.0 - 52.0 % Final   MCV 04/29/2023 86.6  80.0 - 100.0 fL Final   MCH 04/29/2023 29.0  26.0 - 34.0 pg Final   MCHC 04/29/2023 33.5  30.0 - 36.0 g/dL Final   RDW 29/51/8841 14.6  11.5 - 15.5 % Final   Platelets 04/29/2023 278  150 - 400 K/uL Final   nRBC 04/29/2023 0.0  0.0 - 0.2 % Final   Neutrophils Relative % 04/29/2023 61  % Final   Neutro Abs 04/29/2023 5.8  1.7 - 7.7 K/uL Final   Lymphocytes Relative 04/29/2023 22  % Final   Lymphs Abs 04/29/2023 2.0  0.7 - 4.0 K/uL Final   Monocytes Relative 04/29/2023 12  % Final   Monocytes Absolute 04/29/2023 1.1 (H)  0.1 - 1.0 K/uL Final   Eosinophils Relative 04/29/2023 4  % Final   Eosinophils Absolute 04/29/2023 0.3  0.0 - 0.5 K/uL Final   Basophils Relative 04/29/2023 1  % Final   Basophils Absolute 04/29/2023 0.1  0.0 - 0.1 K/uL Final   Immature Granulocytes 04/29/2023 0  % Final   Abs Immature Granulocytes 04/29/2023 0.02  0.00 - 0.07 K/uL Final   Performed at [facility], [address] [place], [place] [address]   B Natriuretic Peptide 04/29/2023 21.2  0.0 - 100.0 pg/mL Final   Performed at [facility], [address] [place], [place] [address]  Admission on 02/27/2023, Discharged on 03/15/2023  Component Date Value Ref Range Status   Hgb A1c MFr Bld 03/05/2023 5.6  4.8 - 5.6 % Final   Comment: (NOTE) Pre diabetes:          5.7%-6.4%  Diabetes:              >6.4%  Glycemic control for   <7.0% adults with diabetes    Mean Plasma Glucose 03/05/2023 114.02  mg/dL Final   Performed at [facility], [address] [place], [place] [address]    Cholesterol 03/05/2023 193  0 - 200 mg/dL Final   Triglycerides 51/88/4166 178 (H)  <150 mg/dL Final   HDL 07/04/1599 52  >40 mg/dL Final   Total CHOL/HDL Ratio 03/05/2023 3.7  RATIO Final   VLDL 03/05/2023 36  0 - 40 mg/dL Final   LDL Cholesterol 03/05/2023 105 (H)  0 - 99 mg/dL Final   Comment:        Total Cholesterol/HDL:CHD Risk Coronary Heart Disease Risk Table                     Men   Women  1/2 Average Risk   3.4   3.3  Average Risk       5.0   4.4  2 X Average Risk   9.6   7.1  3 X Average Risk  23.4   11.0        Use the calculated Patient Ratio above and the CHD Risk Table to determine the patient's CHD Risk.        ATP III CLASSIFICATION (LDL):  <100     mg/dL   Optimal  093-235  mg/dL   Near or Above                    Optimal  130-159  mg/dL   Borderline  573-220  mg/dL   High  >254     mg/dL   Very High Performed at [facility], [address] [place], [place] [address]   Admission on 02/27/2023, Discharged on 02/27/2023  Component Date Value Ref Range Status   Sodium 02/27/2023 137  135 - 145 mmol/L Final   Potassium 02/27/2023 3.6  3.5 - 5.1 mmol/L Final   Chloride 02/27/2023 99  98 - 111 mmol/L Final   CO2 02/27/2023 25  22 - 32 mmol/L Final   Glucose, Bld 02/27/2023 106 (H)  70 - 99 mg/dL Final   Glucose reference range applies only to samples taken after fasting for at least 8 hours.   BUN 02/27/2023 11  6 - 20 mg/dL Final   Creatinine, Ser 02/27/2023 0.97  0.61 - 1.24 mg/dL Final   Calcium 37/62/8315 9.5  8.9 - 10.3 mg/dL Final   Total Protein 17/61/6073 7.6  6.5 - 8.1 g/dL Final   Albumin 71/06/2692 4.1  3.5 - 5.0 g/dL Final   AST 85/46/2703 27  15 - 41 U/L Final   ALT 02/27/2023 20  0 - 44 U/L Final   Alkaline Phosphatase 02/27/2023 59  38 - 126 U/L Final   Total Bilirubin 02/27/2023 1.0  0.0 - 1.2 mg/dL Final   GFR, Estimated 02/27/2023 >60  >60 mL/min Final   Comment: (NOTE) Calculated using the CKD-EPI Creatinine Equation (2021)     Anion gap 02/27/2023 13  5 - 15 Final   Performed at [facility], [address] [place], [place] [address]   Alcohol , Ethyl (B) 02/27/2023 <10  <10 mg/dL Final   Comment: (NOTE) Lowest detectable limit for serum alcohol  is 10 mg/dL.  For medical purposes only. Performed at [facility], [address] [place], [place] [address]    Salicylate Lvl 02/27/2023 <7.0 (L)  7.0 - 30.0 mg/dL Final   Performed at [facility], [address] [place], [place] [address]   Acetaminophen  (Tylenol ), Serum 02/27/2023 <10 (L)  10 - 30 ug/mL Final   Comment: (NOTE) Therapeutic concentrations vary significantly. A range  of 10-30 ug/mL  may be an effective concentration for many patients. However, some  are best treated at concentrations outside of this range. Acetaminophen  concentrations >150 ug/mL at 4 hours after ingestion  and >50 ug/mL at 12 hours after ingestion are often associated with  toxic reactions.  Performed at [facility], [address] [place], [place] [address]    WBC 02/27/2023 7.0  4.0 - 10.5 K/uL Final   RBC 02/27/2023 4.92  4.22 - 5.81 MIL/uL Final   Hemoglobin 02/27/2023 14.4  13.0 - 17.0 g/dL Final   HCT 29/56/2130 44.1  39.0 - 52.0 % Final   MCV 02/27/2023 89.6  80.0 - 100.0 fL Final   MCH 02/27/2023 29.3  26.0 - 34.0 pg Final   MCHC 02/27/2023 32.7  30.0 - 36.0 g/dL Final   RDW 86/57/8469 14.2  11.5 - 15.5 % Final   Platelets 02/27/2023 413 (H)  150 - 400 K/uL Final   nRBC 02/27/2023 0.0  0.0 - 0.2 % Final   Performed at [facility], [address] [place], [place] [address]   Troponin I (High Sensitivity) 02/27/2023 5  <18 ng/L Final   Comment: (NOTE) Elevated high sensitivity troponin I (hsTnI) values and significant  changes across serial measurements may suggest ACS but many other  chronic and acute conditions are known to elevate hsTnI results.  Refer to the "Links" section for chest pain algorithms and  additional  guidance. Performed at [facility], [address] [place], [place] [address]    SARS Coronavirus 2 by RT PCR 02/27/2023 NEGATIVE  NEGATIVE Final   Comment: (NOTE) SARS-CoV-2 target nucleic acids are NOT DETECTED.  The SARS-CoV-2 RNA is generally detectable in upper respiratory specimens during the acute phase of infection. The lowest concentration of SARS-CoV-2 viral copies this assay can detect is 138 copies/mL. A negative result does not preclude SARS-Cov-2 infection and should not be used as the sole basis for treatment or other patient management decisions. A negative result may occur with  improper specimen collection/handling, submission of specimen other than nasopharyngeal swab, presence of viral mutation(s) within the areas targeted by this assay, and inadequate number of viral copies(<138 copies/mL). A negative result must be combined with clinical observations, patient history, and epidemiological information. The expected result is Negative.  Fact Sheet for Patients:  BloggerCourse.com  Fact Sheet for Healthcare Providers:  SeriousBroker.it  This test is no                          t yet approved or cleared by the [place]  FDA and  has been authorized for detection and/or diagnosis of SARS-CoV-2 by FDA under an Emergency Use Authorization (EUA). This EUA will remain  in effect (meaning this test can be used) for the duration of the COVID-19 declaration under Section 564(b)(1) of the Act, 21 U.S.C.section 360bbb-3(b)(1), unless the authorization is terminated  or revoked sooner.       Influenza A by PCR 02/27/2023 NEGATIVE  NEGATIVE Final   Influenza B by PCR 02/27/2023 NEGATIVE  NEGATIVE Final   Comment: (NOTE) The Xpert Xpress SARS-CoV-2/FLU/RSV plus assay is intended as an aid in the diagnosis of influenza from Nasopharyngeal swab specimens and should not be used as a sole basis  for treatment. Nasal washings and aspirates are unacceptable for Xpert Xpress SARS-CoV-2/FLU/RSV testing.  Fact Sheet for Patients: BloggerCourse.com  Fact Sheet for Healthcare Providers: SeriousBroker.it  This test is not yet  approved or cleared by the [place]  FDA and has been authorized for detection and/or diagnosis of SARS-CoV-2 by FDA under an Emergency Use Authorization (EUA). This EUA will remain in effect (meaning this test can be used) for the duration of the COVID-19 declaration under Section 564(b)(1) of the Act, 21 U.S.C. section 360bbb-3(b)(1), unless the authorization is terminated or revoked.     Resp Syncytial Virus by PCR 02/27/2023 NEGATIVE  NEGATIVE Final   Comment: (NOTE) Fact Sheet for Patients: BloggerCourse.com  Fact Sheet for Healthcare Providers: SeriousBroker.it  This test is not yet approved or cleared by the [place]  FDA and has been authorized for detection and/or diagnosis of SARS-CoV-2 by FDA under an Emergency Use Authorization (EUA). This EUA will remain in effect (meaning this test can be used) for the duration of the COVID-19 declaration under Section 564(b)(1) of the Act, 21 U.S.C. section 360bbb-3(b)(1), unless the authorization is terminated or revoked.  Performed at [facility], [address] [place], [place] [address]    Troponin I (High Sensitivity) 02/27/2023 5  <18 ng/L Final   Comment: (NOTE) Elevated high sensitivity troponin I (hsTnI) values and significant  changes across serial measurements may suggest ACS but many other  chronic and acute conditions are known to elevate hsTnI results.  Refer to the "Links" section for chest pain algorithms and additional  guidance. Performed at [facility], [address] [place], [place] [address]    Color, Urine 02/27/2023 YELLOW (A)  YELLOW Final    APPearance 02/27/2023 HAZY (A)  CLEAR Final   Specific Gravity, Urine 02/27/2023 1.021  1.005 - 1.030 Final   pH 02/27/2023 8.0  5.0 - 8.0 Final   Glucose, UA 02/27/2023 150 (A)  NEGATIVE mg/dL Final   Hgb urine dipstick 02/27/2023 NEGATIVE  NEGATIVE Final   Bilirubin Urine 02/27/2023 NEGATIVE  NEGATIVE Final   Ketones, ur 02/27/2023 NEGATIVE  NEGATIVE mg/dL Final   Protein, ur 62/13/0865 NEGATIVE  NEGATIVE mg/dL Final   Nitrite 78/46/9629 NEGATIVE  NEGATIVE Final   Leukocytes,Ua 02/27/2023 NEGATIVE  NEGATIVE Final   RBC / HPF 02/27/2023 0-5  0 - 5 RBC/hpf Final   WBC, UA 02/27/2023 0-5  0 - 5 WBC/hpf Final   Bacteria, UA 02/27/2023 NONE SEEN  NONE SEEN Final   Squamous Epithelial / HPF 02/27/2023 0-5  0 - 5 /HPF Final   Mucus 02/27/2023 PRESENT   Final   Performed at [facility], [address] [place], [place] [address]   Specimen source GC/Chlam 02/27/2023 URINE, RANDOM   Final   Chlamydia Tr 02/27/2023 NOT DETECTED  NOT DETECTED Final   N gonorrhoeae 02/27/2023 NOT DETECTED  NOT DETECTED Final   Comment: (NOTE) This CT/NG assay has not been evaluated in patients with a history of  hysterectomy. Performed at [facility], [address] [place], [place] [address]    Tricyclic, Ur Screen 02/27/2023 NONE DETECTED  NONE DETECTED Final   Amphetamines, Ur Screen 02/27/2023 NONE DETECTED  NONE DETECTED Final   MDMA (Ecstasy)Ur Screen 02/27/2023 NONE DETECTED  NONE DETECTED Final   Cocaine Metabolite,Ur Ina 02/27/2023 POSITIVE (A)  NONE DETECTED Final   Opiate, Ur Screen 02/27/2023 NONE DETECTED  NONE DETECTED Final   Phencyclidine (PCP) Ur S 02/27/2023 NONE DETECTED  NONE DETECTED Final   Cannabinoid 50 Ng, Ur Jardine 02/27/2023 NONE DETECTED  NONE DETECTED Final   Barbiturates, Ur Screen 02/27/2023 NONE DETECTED  NONE DETECTED Final   Benzodiazepine, Ur Scrn 02/27/2023 POSITIVE (A)  NONE DETECTED Final   Methadone Scn, Ur 02/27/2023 NONE DETECTED  NONE DETECTED Final    Comment: (NOTE) Tricyclics + metabolites, urine    Cutoff 1000 ng/mL Amphetamines + metabolites, urine  Cutoff 1000 ng/mL MDMA (Ecstasy), urine              Cutoff 500 ng/mL Cocaine Metabolite, urine          Cutoff 300 ng/mL Opiate + metabolites, urine        Cutoff 300 ng/mL Phencyclidine (PCP), urine         Cutoff 25 ng/mL Cannabinoid, urine                 Cutoff 50 ng/mL Barbiturates + metabolites, urine  Cutoff 200 ng/mL Benzodiazepine, urine              Cutoff 200 ng/mL Methadone, urine                   Cutoff 300 ng/mL  The urine drug screen provides only a preliminary, unconfirmed analytical test result and should not be used for non-medical purposes. Clinical consideration and professional judgment should be applied to any positive drug screen result due to possible interfering substances. A more specific alternate chemical method must be used in order to obtain a confirmed analytical result. Gas chromatography / mass spectrometry (GC/MS) is the preferred confirm                          atory method. Performed at [facility], [address] [place], [place] [address]   Admission on 02/18/2023, Discharged on 02/19/2023  Component Date Value Ref Range Status   Sodium 02/19/2023 134 (L)  135 - 145 mmol/L Final   Potassium 02/19/2023 4.3  3.5 - 5.1 mmol/L Final   Chloride 02/19/2023 100  98 - 111 mmol/L Final   CO2 02/19/2023 24  22 - 32 mmol/L Final   Glucose, Bld 02/19/2023 213 (H)  70 - 99 mg/dL Final   Glucose reference range applies only to samples taken after fasting for at least 8 hours.   BUN 02/19/2023 15  6 - 20 mg/dL Final   Creatinine, Ser 02/19/2023 0.95  0.61 - 1.24 mg/dL Final   Calcium 57/84/6962 9.5  8.9 - 10.3 mg/dL Final   Total Protein 95/28/4132 6.6  6.5 - 8.1 g/dL Final   Albumin 44/01/270 3.4 (L)  3.5 - 5.0 g/dL Final   AST 53/66/4403 30  15 - 41 U/L Final   ALT 02/19/2023 19  0 - 44 U/L Final   Alkaline Phosphatase 02/19/2023 60   38 - 126 U/L Final   Total Bilirubin 02/19/2023 0.4  0.0 - 1.2 mg/dL Final   GFR, Estimated 02/19/2023 >60  >60 mL/min Final   Comment: (NOTE) Calculated using the CKD-EPI Creatinine Equation (2021)    Anion gap 02/19/2023 10  5 - 15 Final   Performed at [facility], [address] [place], [place] [address]   Alcohol , Ethyl (B) 02/19/2023 <10  <10 mg/dL Final   Comment: (NOTE) Lowest detectable limit for serum alcohol  is 10 mg/dL.  For medical purposes only. Performed at [facility], [address], [place], [place] [address]    Salicylate Lvl 02/19/2023 <7.0 (L)  7.0 - 30.0 mg/dL Final   Performed at [facility], [address], [place], [place] [address]   Acetaminophen  (Tylenol ), Serum 02/19/2023 <10 (L)  10 -  30 ug/mL Final   Comment: (NOTE) Therapeutic concentrations vary significantly. A range of 10-30 ug/mL  may be an effective concentration for many patients. However, some  are best treated at concentrations outside of this range. Acetaminophen  concentrations >150 ug/mL at 4 hours after ingestion  and >50 ug/mL at 12 hours after ingestion are often associated with  toxic reactions.  Performed at [facility], [address] [place] [address], [place], [place] [address]    WBC 02/19/2023 4.5  4.0 - 10.5 K/uL Final   RBC 02/19/2023 4.73  4.22 - 5.81 MIL/uL Final   Hemoglobin 02/19/2023 13.8  13.0 - 17.0 g/dL Final   HCT 13/24/4010 41.8  39.0 - 52.0 % Final   MCV 02/19/2023 88.4  80.0 - 100.0 fL Final   MCH 02/19/2023 29.2  26.0 - 34.0 pg Final   MCHC 02/19/2023 33.0  30.0 - 36.0 g/dL Final   RDW 27/25/3664 13.6  11.5 - 15.5 % Final   Platelets 02/19/2023 373  150 - 400 K/uL Final   nRBC 02/19/2023 0.0  0.0 - 0.2 % Final   Performed at [facility], [address] [place], [place] [address]   Opiates 02/19/2023 NONE DETECTED  NONE DETECTED Final   Cocaine 02/19/2023 POSITIVE (A)  NONE DETECTED Final   Benzodiazepines 02/19/2023 NONE DETECTED  NONE  DETECTED Final   Amphetamines 02/19/2023 NONE DETECTED  NONE DETECTED Final   Tetrahydrocannabinol 02/19/2023 NONE DETECTED  NONE DETECTED Final   Barbiturates 02/19/2023 NONE DETECTED  NONE DETECTED Final   Comment: (NOTE) DRUG SCREEN FOR MEDICAL PURPOSES ONLY.  IF CONFIRMATION IS NEEDED FOR ANY PURPOSE, NOTIFY LAB WITHIN 5 DAYS.  LOWEST DETECTABLE LIMITS FOR URINE DRUG SCREEN Drug Class                     Cutoff (ng/mL) Amphetamine and metabolites    1000 Barbiturate and metabolites    200 Benzodiazepine                 200 Opiates and metabolites        300 Cocaine and metabolites        300 THC                            50 Performed at [facility], [address], [place], [place] [address]   Admission on 02/18/2023, Discharged on 02/18/2023  Component Date Value Ref Range Status   Sodium 02/18/2023 137  135 - 145 mmol/L Final   Potassium 02/18/2023 4.1  3.5 - 5.1 mmol/L Final   Chloride 02/18/2023 97 (L)  98 - 111 mmol/L Final   CO2 02/18/2023 25  22 - 32 mmol/L Final   Glucose, Bld 02/18/2023 125 (H)  70 - 99 mg/dL Final   Glucose reference range applies only to samples taken after fasting for at least 8 hours.   BUN 02/18/2023 11  6 - 20 mg/dL Final   Creatinine, Ser 02/18/2023 0.91  0.61 - 1.24 mg/dL Final   Calcium 63/87/5643 9.2  8.9 - 10.3 mg/dL Final   Total Protein 32/95/1884 6.9  6.5 - 8.1 g/dL Final   Albumin 16/60/6301 3.7  3.5 - 5.0 g/dL Final   AST 60/10/9321 36  15 - 41 U/L Final   ALT 02/18/2023 20  0 - 44 U/L Final   Alkaline Phosphatase 02/18/2023 52  38 - 126 U/L Final   Total Bilirubin 02/18/2023  0.7  0.0 - 1.2 mg/dL Final   GFR, Estimated 02/18/2023 >60  >60 mL/min Final   Comment: (NOTE) Calculated using the CKD-EPI Creatinine Equation (2021)    Anion gap 02/18/2023 15  5 - 15 Final   Performed at [facility], [address] [place], [place] [address]   Alcohol , Ethyl (B) 02/18/2023 16 (H)  <10 mg/dL Final   Comment:  (NOTE) Lowest detectable limit for serum alcohol  is 10 mg/dL.  For medical purposes only. Performed at [facility], [address] [place], [place] [address]    WBC 02/18/2023 9.5  4.0 - 10.5 K/uL Final   RBC 02/18/2023 5.09  4.22 - 5.81 MIL/uL Final   Hemoglobin 02/18/2023 14.8  13.0 - 17.0 g/dL Final   HCT 40/34/7425 45.4  39.0 - 52.0 % Final   MCV 02/18/2023 89.2  80.0 - 100.0 fL Final   MCH 02/18/2023 29.1  26.0 - 34.0 pg Final   MCHC 02/18/2023 32.6  30.0 - 36.0 g/dL Final   RDW 95/63/8756 13.6  11.5 - 15.5 % Final   Platelets 02/18/2023 399  150 - 400 K/uL Final   nRBC 02/18/2023 0.0  0.0 - 0.2 % Final   Performed at [facility], [address] [place], [place] [address]   Opiates 02/18/2023 NONE DETECTED  NONE DETECTED Final   Cocaine 02/18/2023 POSITIVE (A)  NONE DETECTED Final   Benzodiazepines 02/18/2023 NONE DETECTED  NONE DETECTED Final   Amphetamines 02/18/2023 NONE DETECTED  NONE DETECTED Final   Tetrahydrocannabinol 02/18/2023 NONE DETECTED  NONE DETECTED Final   Barbiturates 02/18/2023 NONE DETECTED  NONE DETECTED Final   Comment: (NOTE) DRUG SCREEN FOR MEDICAL PURPOSES ONLY.  IF CONFIRMATION IS NEEDED FOR ANY PURPOSE, NOTIFY LAB WITHIN 5 DAYS.  LOWEST DETECTABLE LIMITS FOR URINE DRUG SCREEN Drug Class                     Cutoff (ng/mL) Amphetamine and metabolites    1000 Barbiturate and metabolites    200 Benzodiazepine                 200 Opiates and metabolites        300 Cocaine and metabolites        300 THC                            50 Performed at [facility], [address], [place], [place] [address]    SARS Coronavirus 2 by RT PCR 02/18/2023 NEGATIVE  NEGATIVE Final   Influenza A by PCR 02/18/2023 NEGATIVE  NEGATIVE Final   Influenza B by PCR 02/18/2023 NEGATIVE  NEGATIVE Final   Comment: (NOTE) The Xpert Xpress SARS-CoV-2/FLU/RSV plus assay is intended as an aid in the diagnosis of influenza from Nasopharyngeal swab  specimens and should not be used as a sole basis for treatment. Nasal washings and aspirates are unacceptable for Xpert Xpress SARS-CoV-2/FLU/RSV testing.  Fact Sheet for Patients: BloggerCourse.com  Fact Sheet for Healthcare Providers: SeriousBroker.it  This test is not yet approved or cleared by the [place]  FDA and has been authorized for detection and/or diagnosis of SARS-CoV-2 by FDA under an Emergency Use Authorization (EUA). This EUA will remain in effect (meaning this test can be used) for the duration of the COVID-19 declaration under Section 564(b)(1) of the Act, 21 U.S.C. section 360bbb-3(b)(1), unless the authorization is terminated or revoked.  Resp Syncytial Virus by PCR 02/18/2023 NEGATIVE  NEGATIVE Final   Comment: (NOTE) Fact Sheet for Patients: BloggerCourse.com  Fact Sheet for Healthcare Providers: SeriousBroker.it  This test is not yet approved or cleared by the [place]  FDA and has been authorized for detection and/or diagnosis of SARS-CoV-2 by FDA under an Emergency Use Authorization (EUA). This EUA will remain in effect (meaning this test can be used) for the duration of the COVID-19 declaration under Section 564(b)(1) of the Act, 21 U.S.C. section 360bbb-3(b)(1), unless the authorization is terminated or revoked.  Performed at [facility], [address] [place], [place] [address]     Blood Alcohol  level:  Lab Results  Component Value Date   [facility] <15 06/07/2023   ETH <10 02/27/2023    Metabolic Disorder Labs: Lab Results  Component Value Date   HGBA1C 5.6 03/05/2023   MPG 114.02 03/05/2023   MPG 117 02/02/2009   No results found for: "PROLACTIN" Lab Results  Component Value Date   CHOL 193 03/05/2023   TRIG 178 (H) 03/05/2023   HDL 52 03/05/2023   CHOLHDL 3.7 03/05/2023   VLDL 36 03/05/2023   LDLCALC 105 (H)  03/05/2023   LDLCALC 80 10/31/2022    Therapeutic Lab Levels: No results found for: "LITHIUM" No results found for: "VALPROATE" No results found for: "CBMZ"  Physical Findings   AIMS    Flowsheet Row Admission (Discharged) from 02/02/2016 [facility] Admission (Discharged) from 03/22/2015 in [facility] Admission (Discharged) from 10/05/2014 in [facility]PATIENT BE[facility] Admission (Dis[facility]) [facility]  AIMS Total Score 0 0 0 0      AUDIT    Flowsheet Row Admission (Discharged) from 02/27/2023 in [facility] BEHAVIORAL MEDICINE Admission (Discharged) from 11/17/2022 in [facility] INPATIENT BEHAVIORAL MEDICINE Admission (Discharged) from 10/27/2022 in [facility] INPATIENT BEHAVIORAL MEDICINE Admission (Discharged) from 03/22/2015 in [facility] Admission (Discharged) from 10/05/2014 in [facility] INPATIENT BEHAVIORAL MEDICINE  Alcohol  Use Disorder Identification Test Final Score (AUDIT) 22 14 14 25 18       PHQ2-9    Flowsheet Row ED from 06/08/2023 in [facility] ED from 12/03/2022 in [facility]  PHQ-2 Total Score 4 0  PHQ-9 Total Score 9 0      Flowsheet Row ED from 06/08/2023 in [facility] ED from 06/07/2023 in [facility] Emergency Department at [facility] ED to Hosp-Admission (Discharged) from 05/16/2023 in [facility] 1C MEDICAL TELEMETRY  C-SSRS RISK CATEGORY No Risk No Risk No Risk        Musculoskeletal  Strength & Muscle Tone: within normal limits Gait & Station: normal Patient leans: N/A  Psychiatric Specialty Exam: Physical Exam Vitals and nursing note reviewed.  Constitutional:      Appearance: Normal appearance.  HENT:     Head: Normocephalic.     Nose: Nose normal.  Pulmonary:     Effort: Pulmonary effort is normal.   Musculoskeletal:        General: Normal range of motion.     Cervical back: Normal range of motion.  Neurological:     General: No focal deficit present.     Mental Status: He is alert and oriented to person, place, and time.     Review of Systems  Psychiatric/Behavioral:  Positive for depression and substance abuse. The patient is nervous/anxious.   All other systems reviewed and are  negative.   Blood pressure (!) 147/78, pulse 85, temperature 98.9 F (37.2 C), temperature source Oral, resp. rate 18, SpO2 96%.There is no height or weight on file to calculate BMI.  General Appearance: Casual  Eye Contact:  Fair  Speech:  Clear and Coherent  Volume:  Normal  Mood:  Anxious and Depressed  Affect:  Blunt  Thought Process:  Coherent  Orientation:  Full (Time, Place, and Person)  Thought Content:  Logical  Suicidal Thoughts:  No  Homicidal Thoughts:  No  Memory:  Immediate;   Fair Recent;   Fair Remote;   Fair  Judgement:  Fair  Insight:  Fair  Psychomotor Activity:  Decreased  Concentration:  Concentration: Fair and Attention Span: Fair  Recall:  [employer] of Knowledge:  Fair  Language:  Good  Akathisia:  No  Handed:  Right  AIMS (if indicated):     Assets:  Leisure Time Physical Health Resilience  ADL's:  Intact  Cognition:  WNL  Sleep:        Physical Exam  Physical Exam Vitals and nursing note reviewed.  Constitutional:      Appearance: Normal appearance.  HENT:     Head: Normocephalic.     Nose: Nose normal.  Pulmonary:     Effort: Pulmonary effort is normal.  Musculoskeletal:        General: Normal range of motion.     Cervical back: Normal range of motion.  Neurological:     General: No focal deficit present.     Mental Status: He is alert and oriented to person, place, and time.    Review of Systems  Psychiatric/Behavioral:  Positive for depression and substance abuse. The patient is nervous/anxious.   All other systems reviewed and are  negative.  Blood pressure (!) 147/78, pulse 85, temperature 98.9 F (37.2 C), temperature source Oral, resp. rate 18, SpO2 96%. There is no height or weight on file to calculate BMI.  Treatment Plan Summary: Daily contact with patient to assess and evaluate symptoms and progress in treatment, Medication management, and Plan : Cocaine and alcohol  use d/o with uncomplicated withdrawal: Continue Ativan  detox protocol   Depression: Abilify 10 mg daily  Prozac  10 mg daily increased to 20 mg daily   Anxiety: Hydroxyzine  25 mg every six hours PRN   Insomnia: Trazodone  50 mg at bedtime PRN  Back Pain: Gabapentin  300 mg TID Ibuprofen  600 mg every 6 hours PRN  Roslynn Coombes, NP 06/09/2023 9:24 AM

## 2023-06-09 NOTE — Group Note (Signed)
 Group Topic: Social Support  Group Date: 06/09/2023 Start Time: 1710 End Time: 1720 Facilitators: Esther Hem, NT  Department: [facility]  Number of Participants: 12  Group Focus: reminiscence Treatment Modality:  Psychoeducation Interventions utilized were reminiscence and support Purpose: increase insight  Name: Franklin [name] Date of Birth: 01-Mar-1970  MR: [id number]    Level of Participation: PT was present but did not share anything Quality of Participation: attentive Interactions with others: PT was seen shaking his head in agreement Mood/Affect: appropriate Triggers (if applicable): n/a Cognition: coherent/clear Progress: Gaining insight Response: n/a Plan: patient will be encouraged to attend group  Patients Problems:  Patient Active Problem List   Diagnosis Date Noted   Hypokalemia 05/16/2023   A-fib [facility])    STD (male) 05/05/2023   Malingering 02/27/2023   MDD (major depressive disorder) 02/27/2023   Cocaine abuse with cocaine-induced mood disorder (HCC) 12/03/2022   Major depressive disorder, 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

## 2023-06-09 NOTE — ED Notes (Signed)
 Pt presented at nurses station.  Guarded and short with responses. Pt denied current SI plan and intent,  Pt stated he was just in a lot of pain.  Medication compliant. Q 15 minute observations for safety continue

## 2023-06-10 DIAGNOSIS — F17210 Nicotine dependence, cigarettes, uncomplicated: Secondary | ICD-10-CM | POA: Diagnosis not present

## 2023-06-10 DIAGNOSIS — F32A Depression, unspecified: Secondary | ICD-10-CM | POA: Diagnosis not present

## 2023-06-10 DIAGNOSIS — Z5900 Homelessness unspecified: Secondary | ICD-10-CM | POA: Diagnosis not present

## 2023-06-10 DIAGNOSIS — F1424 Cocaine dependence with cocaine-induced mood disorder: Secondary | ICD-10-CM | POA: Diagnosis not present

## 2023-06-10 MED ORDER — PROPRANOLOL HCL 10 MG PO TABS
10.0000 mg | ORAL_TABLET | Freq: Two times a day (BID) | ORAL | Status: DC
  Administered 2023-06-10 – 2023-06-14 (×8): 10 mg via ORAL
  Filled 2023-06-10 (×8): qty 1

## 2023-06-10 NOTE — ED Notes (Signed)
 Pt is isolative to bedroom only coming out to take scheduled medication. Pt denies SI/HI/AVH. Pt voiced no complaints to this Clinical research associate. Pt is safe on the unit at this time.

## 2023-06-10 NOTE — ED Provider Notes (Addendum)
 Behavioral Health Progress Note  Date and Time: 06/10/2023 4:10 PM Name: Franklin [name] MRN:  [id number]  HPI: Franklin [name] is a 53 y.o. male with a history of cocaine use disorder who initially presented to the [facility] ER on 06/3 after being found acting erratically outside with a change in mental status. Pt was also very agitated, requiring Versed  to be subdued and transported to the ER for medical evaluation where he was subsequently recommended for inpatient treatment, and transferred to the [facility] for treatment and stabilization of his mental status on 06/04. Main stressor as reported on admission to the [facility] is homelessness and substance abuse.  Patient assessment, 06/10/2023: On assessment today, the pt reports that their mood is still very depressed, rates depression as a 7, 10 being worse. Reports that anxiety is also still very anxious, rates anxiety today as a 7, 10 being worst. Sleep is fair, and improving. Appetite is fair, and also improving. Concentration is average. Energy level is low. Denies suicidal thoughts.  Denies suicidal intent and plan.  Denies having any HI.  Denies having psychotic symptoms.   Denies having side effects to current psychiatric medications.   We discussed keeping medications same, and adding Inderal 10mg  BID for GAD and hypertension.  Patient is currently on Prozac  20 mg, for depressive symptoms, Abilify 10 mg for augmentation, as well as gabapentin  300 mg 3 times daily for anxiety.  At this point, patient's depressive symptoms seem to be more of economic stressors with his homelessness and lack of finances being a huge contributor.  He has been encouraged to speak with CSW to explore resources that might be helpful to him.  Discussed the following psychosocial stressors: Homelessness, as well as recurrent substance abuse, patient verbalized wanting to speak with CSW in an effort to explore housing resources as well as resources  for substance use.  Patient verbalized an interest in substance abuse treatment options, he was educated that CSW will be provided with the information.  Empathy and active listening was provided.  Projected discharge will be early next week, patient has been encouraged to start working with CSW, in order t get referrals into substance abuse treatment facilities or halfway houses, and has verbalized understanding.  Diagnosis:  Final diagnoses:  Substance induced mood disorder (HCC)   Total Time spent with patient: 45 minutes  Sleep: Fair  Appetite:  Fair  Current Medications:  Current Facility-Administered Medications  Medication Dose Route Frequency Provider Last Rate Last Admin   ARIPiprazole (ABILIFY) tablet 10 mg  10 mg Oral Daily McLauchlin, Angela, NP   10 mg at 06/10/23 4098   FLUoxetine  (PROZAC ) capsule 20 mg  20 mg Oral Daily Lord, Jamison Y, NP   20 mg at 06/10/23 1191   gabapentin  (NEURONTIN ) capsule 300 mg  300 mg Oral TID Lord, Jamison Y, NP   300 mg at 06/10/23 1522   hydrOXYzine  (ATARAX ) tablet 25 mg  25 mg Oral Q6H PRN McLauchlin, Angela, NP       ibuprofen  (ADVIL ) tablet 600 mg  600 mg Oral Q6H PRN Lord, Jamison Y, NP       loperamide  (IMODIUM ) capsule 2-4 mg  2-4 mg Oral PRN McLauchlin, Angela, NP       multivitamin with minerals tablet 1 tablet  1 tablet Oral Daily McLauchlin, Angela, NP   1 tablet at 06/10/23 0931   ondansetron  (ZOFRAN -ODT) disintegrating tablet 4 mg  4 mg Oral Q6H PRN McLauchlin, Angela, NP  propranolol (INDERAL) tablet 10 mg  10 mg Oral BID Kahlel Peake, NP       thiamine  (VITAMIN B1) tablet 100 mg  100 mg Oral Daily McLauchlin, Angela, NP   100 mg at 06/10/23 0931   traZODone  (DESYREL ) tablet 50 mg  50 mg Oral QHS PRN Lord, Jamison Y, NP   50 mg at 06/09/23 2124   Current Outpatient Medications  Medication Sig Dispense Refill   albuterol  (VENTOLIN  HFA) 108 (90 Base) MCG/ACT inhaler Inhale 2 puffs into the lungs every 6 (six) hours as needed  for wheezing or shortness of breath. 8 g 0   FLUoxetine  (PROZAC ) 10 MG capsule Take 3 capsules (30 mg total) by mouth daily. 90 capsule 0   fluticasone  furoate-vilanterol (BREO ELLIPTA ) 100-25 MCG/ACT AEPB Inhale 1 puff into the lungs daily. 60 each 0   ipratropium-albuterol  (DUONEB) 0.5-2.5 (3) MG/3ML SOLN Take 3 mLs by nebulization every 6 (six) hours as needed. (Patient taking differently: Take 3 mLs by nebulization every 6 (six) hours as needed (For shortness of breath).) 360 mL 0    Labs  Lab Results:  Admission on 06/07/2023, Discharged on 06/08/2023  Component Date Value Ref Range Status   WBC 06/07/2023 6.9  4.0 - 10.5 K/uL Final   RBC 06/07/2023 4.11 (L)  4.22 - 5.81 MIL/uL Final   Hemoglobin 06/07/2023 11.8 (L)  13.0 - 17.0 g/dL Final   HCT 82/95/6213 35.9 (L)  39.0 - 52.0 % Final   MCV 06/07/2023 87.3  80.0 - 100.0 fL Final   MCH 06/07/2023 28.7  26.0 - 34.0 pg Final   MCHC 06/07/2023 32.9  30.0 - 36.0 g/dL Final   RDW 08/65/7846 15.1  11.5 - 15.5 % Final   Platelets 06/07/2023 249  150 - 400 K/uL Final   nRBC 06/07/2023 0.0  0.0 - 0.2 % Final   Neutrophils Relative % 06/07/2023 66  % Final   Neutro Abs 06/07/2023 4.6  1.7 - 7.7 K/uL Final   Lymphocytes Relative 06/07/2023 22  % Final   Lymphs Abs 06/07/2023 1.5  0.7 - 4.0 K/uL Final   Monocytes Relative 06/07/2023 9  % Final   Monocytes Absolute 06/07/2023 0.6  0.1 - 1.0 K/uL Final   Eosinophils Relative 06/07/2023 2  % Final   Eosinophils Absolute 06/07/2023 0.1  0.0 - 0.5 K/uL Final   Basophils Relative 06/07/2023 1  % Final   Basophils Absolute 06/07/2023 0.0  0.0 - 0.1 K/uL Final   Immature Granulocytes 06/07/2023 0  % Final   Abs Immature Granulocytes 06/07/2023 0.03  0.00 - 0.07 K/uL Final   Performed at [facility], [address] [place], [place] [address]   Sodium 06/07/2023 138  135 - 145 mmol/L Final   Potassium 06/07/2023 3.7  3.5 - 5.1 mmol/L Final   Chloride 06/07/2023 104  98 - 111 mmol/L  Final   CO2 06/07/2023 24  22 - 32 mmol/L Final   Glucose, Bld 06/07/2023 154 (H)  70 - 99 mg/dL Final   Glucose reference range applies only to samples taken after fasting for at least 8 hours.   BUN 06/07/2023 14  6 - 20 mg/dL Final   Creatinine, Ser 06/07/2023 0.88  0.61 - 1.24 mg/dL Final   Calcium 28/41/3244 9.3  8.9 - 10.3 mg/dL Final   Total Protein 01/06/7251 6.2 (L)  6.5 - 8.1 g/dL Final   Albumin 66/44/0347 3.7  3.5 - 5.0 g/dL Final   AST 42/59/5638 29  15 -  41 U/L Final   ALT 06/07/2023 20  0 - 44 U/L Final   Alkaline Phosphatase 06/07/2023 62  38 - 126 U/L Final   Total Bilirubin 06/07/2023 2.0 (H)  0.0 - 1.2 mg/dL Final   GFR, Estimated 06/07/2023 >60  >60 mL/min Final   Comment: (NOTE) Calculated using the CKD-EPI Creatinine Equation (2021)    Anion gap 06/07/2023 10  5 - 15 Final   Performed at [facility], [address] [place], [place] [address]   Salicylate Lvl 06/07/2023 <7.0 (L)  7.0 - 30.0 mg/dL Final   Performed at [facility], [address] [place], [place] [address]   Acetaminophen  (Tylenol ), Serum 06/07/2023 <10 (L)  10 - 30 ug/mL Final   Comment: (NOTE) Therapeutic concentrations vary significantly. A range of 10-30 ug/mL  may be an effective concentration for many patients. However, some  are best treated at concentrations outside of this range. Acetaminophen  concentrations >150 ug/mL at 4 hours after ingestion  and >50 ug/mL at 12 hours after ingestion are often associated with  toxic reactions.  Performed at [facility], [address] [place], [place] [address]    Alcohol , Ethyl (B) 06/07/2023 <15  <15 mg/dL Final   Comment: (NOTE) For medical purposes only. Performed at [facility], [address] [place], [place] [address]    Tricyclic, Ur Screen 06/08/2023 NONE DETECTED  NONE DETECTED Final   Amphetamines, Ur Screen 06/08/2023 NONE DETECTED  NONE DETECTED Final   MDMA (Ecstasy)Ur Screen  06/08/2023 NONE DETECTED  NONE DETECTED Final   Cocaine Metabolite,Ur [place] 06/08/2023 POSITIVE (A)  NONE DETECTED Final   Opiate, Ur Screen 06/08/2023 NONE DETECTED  NONE DETECTED Final   Phencyclidine (PCP) Ur S 06/08/2023 NONE DETECTED  NONE DETECTED Final   Cannabinoid 50 Ng, Ur [place] 06/08/2023 NONE DETECTED  NONE DETECTED Final   Barbiturates, Ur Screen 06/08/2023 NONE DETECTED  NONE DETECTED Final   Benzodiazepine, Ur Scrn 06/08/2023 POSITIVE (A)  NONE DETECTED Final   Methadone Scn, Ur 06/08/2023 NONE DETECTED  NONE DETECTED Final   Comment: (NOTE) Tricyclics + metabolites, urine    Cutoff 1000 ng/mL Amphetamines + metabolites, urine  Cutoff 1000 ng/mL MDMA (Ecstasy), urine              Cutoff 500 ng/mL Cocaine Metabolite, urine          Cutoff 300 ng/mL Opiate + metabolites, urine        Cutoff 300 ng/mL Phencyclidine (PCP), urine         Cutoff 25 ng/mL Cannabinoid, urine                 Cutoff 50 ng/mL Barbiturates + metabolites, urine  Cutoff 200 ng/mL Benzodiazepine, urine              Cutoff 200 ng/mL Methadone, urine                   Cutoff 300 ng/mL  The urine drug screen provides only a preliminary, unconfirmed analytical test result and should not be used for non-medical purposes. Clinical consideration and professional judgment should be applied to any positive drug screen result due to possible interfering substances. A more specific alternate chemical method must be used in order to obtain a confirmed analytical result. Gas chromatography / mass spectrometry (GC/MS) is the preferred confirm  atory method. Performed at [facility], [address] [place], [place] [address]   Admission on 05/16/2023, Discharged on 05/17/2023  Component Date Value Ref Range Status   WBC 05/16/2023 8.2  4.0 - 10.5 K/uL Final   RBC 05/16/2023 4.86  4.22 - 5.81 MIL/uL Final   Hemoglobin 05/16/2023 14.3  13.0 - 17.0 g/dL Final   HCT 60/45/4098 43.8   39.0 - 52.0 % Final   MCV 05/16/2023 90.1  80.0 - 100.0 fL Final   MCH 05/16/2023 29.4  26.0 - 34.0 pg Final   MCHC 05/16/2023 32.6  30.0 - 36.0 g/dL Final   RDW 11/91/4782 15.2  11.5 - 15.5 % Final   Platelets 05/16/2023 348  150 - 400 K/uL Final   nRBC 05/16/2023 0.0  0.0 - 0.2 % Final   Neutrophils Relative % 05/16/2023 61  % Final   Neutro Abs 05/16/2023 5.2  1.7 - 7.7 K/uL Final   Lymphocytes Relative 05/16/2023 30  % Final   Lymphs Abs 05/16/2023 2.4  0.7 - 4.0 K/uL Final   Monocytes Relative 05/16/2023 5  % Final   Monocytes Absolute 05/16/2023 0.4  0.1 - 1.0 K/uL Final   Eosinophils Relative 05/16/2023 3  % Final   Eosinophils Absolute 05/16/2023 0.2  0.0 - 0.5 K/uL Final   Basophils Relative 05/16/2023 1  % Final   Basophils Absolute 05/16/2023 0.1  0.0 - 0.1 K/uL Final   Immature Granulocytes 05/16/2023 0  % Final   Abs Immature Granulocytes 05/16/2023 0.02  0.00 - 0.07 K/uL Final   Performed at [facility], [address] [place], [place] [address]   Sodium 05/16/2023 139  135 - 145 mmol/L Final   Potassium 05/16/2023 3.2 (L)  3.5 - 5.1 mmol/L Final   Chloride 05/16/2023 102  98 - 111 mmol/L Final   CO2 05/16/2023 24  22 - 32 mmol/L Final   Glucose, Bld 05/16/2023 125 (H)  70 - 99 mg/dL Final   Glucose reference range applies only to samples taken after fasting for at least 8 hours.   BUN 05/16/2023 7  6 - 20 mg/dL Final   Creatinine, Ser 05/16/2023 1.08  0.61 - 1.24 mg/dL Final   Calcium 30/86/5784 9.1  8.9 - 10.3 mg/dL Final   GFR, Estimated 05/16/2023 >60  >60 mL/min Final   Comment: (NOTE) Calculated using the CKD-EPI Creatinine Equation (2021)    Anion gap 05/16/2023 13  5 - 15 Final   Performed at [facility], [address] [place], [place] [address]   Troponin I (High Sensitivity) 05/16/2023 6  <18 ng/L Final   Comment: (NOTE) Elevated high sensitivity troponin I (hsTnI) values and significant  changes across serial measurements may  suggest ACS but many other  chronic and acute conditions are known to elevate hsTnI results.  Refer to the "Links" section for chest pain algorithms and additional  guidance. Performed at [facility], [address] [place], [place] [address]    SARS Coronavirus 2 by RT PCR 05/16/2023 NEGATIVE  NEGATIVE Final   Comment: (NOTE) SARS-CoV-2 target nucleic acids are NOT DETECTED.  The SARS-CoV-2 RNA is generally detectable in upper respiratory specimens during the acute phase of infection. The lowest concentration of SARS-CoV-2 viral copies this assay can detect is 138 copies/mL. A negative result does not preclude SARS-Cov-2 infection and should not be used as the sole basis for treatment or other patient management decisions. A negative result may occur with  improper  specimen collection/handling, submission of specimen other than nasopharyngeal swab, presence of viral mutation(s) within the areas targeted by this assay, and inadequate number of viral copies(<138 copies/mL). A negative result must be combined with clinical observations, patient history, and epidemiological information. The expected result is Negative.  Fact Sheet for Patients:  BloggerCourse.com  Fact Sheet for Healthcare Providers:  SeriousBroker.it  This test is no                          t yet approved or cleared by the [place]  FDA and  has been authorized for detection and/or diagnosis of SARS-CoV-2 by FDA under an Emergency Use Authorization (EUA). This EUA will remain  in effect (meaning this test can be used) for the duration of the COVID-19 declaration under Section 564(b)(1) of the Act, 21 U.S.C.section 360bbb-3(b)(1), unless the authorization is terminated  or revoked sooner.       Influenza A by PCR 05/16/2023 NEGATIVE  NEGATIVE Final   Influenza B by PCR 05/16/2023 NEGATIVE  NEGATIVE Final   Comment: (NOTE) The Xpert Xpress  SARS-CoV-2/FLU/RSV plus assay is intended as an aid in the diagnosis of influenza from Nasopharyngeal swab specimens and should not be used as a sole basis for treatment. Nasal washings and aspirates are unacceptable for Xpert Xpress SARS-CoV-2/FLU/RSV testing.  Fact Sheet for Patients: BloggerCourse.com  Fact Sheet for Healthcare Providers: SeriousBroker.it  This test is not yet approved or cleared by the [place]  FDA and has been authorized for detection and/or diagnosis of SARS-CoV-2 by FDA under an Emergency Use Authorization (EUA). This EUA will remain in effect (meaning this test can be used) for the duration of the COVID-19 declaration under Section 564(b)(1) of the Act, 21 U.S.C. section 360bbb-3(b)(1), unless the authorization is terminated or revoked.     Resp Syncytial Virus by PCR 05/16/2023 NEGATIVE  NEGATIVE Final   Comment: (NOTE) Fact Sheet for Patients: BloggerCourse.com  Fact Sheet for Healthcare Providers: SeriousBroker.it  This test is not yet approved or cleared by the [place]  FDA and has been authorized for detection and/or diagnosis of SARS-CoV-2 by FDA under an Emergency Use Authorization (EUA). This EUA will remain in effect (meaning this test can be used) for the duration of the COVID-19 declaration under Section 564(b)(1) of the Act, 21 U.S.C. section 360bbb-3(b)(1), unless the authorization is terminated or revoked.  Performed at [facility], [address] [place], [place] [address]    pH, Ven 05/16/2023 7.38  7.25 - 7.43 Final   pCO2, Ven 05/16/2023 44  44 - 60 mmHg Final   pO2, Ven 05/16/2023 96 (H)  32 - 45 mmHg Final   Bicarbonate 05/16/2023 26.0  20.0 - 28.0 mmol/L Final   Acid-Base Excess 05/16/2023 0.5  0.0 - 2.0 mmol/L Final   O2 Saturation 05/16/2023 98.7  % Final   Patient temperature 05/16/2023 37.0   Final    Collection site 05/16/2023 VEIN   Final   Performed at [facility], [address] [place], [place] [address]   Troponin I (High Sensitivity) 05/16/2023 5  <18 ng/L Final   Comment: (NOTE) Elevated high sensitivity troponin I (hsTnI) values and significant  changes across serial measurements may suggest ACS but many other  chronic and acute conditions are known to elevate hsTnI results.  Refer to the "Links" section for chest pain algorithms and additional  guidance. Performed at [facility], [address] [place], [place] [address]  Tricyclic, Ur Screen 05/16/2023 NONE DETECTED  NONE DETECTED Final   Amphetamines, Ur Screen 05/16/2023 POSITIVE (A)  NONE DETECTED Final   Comment: (NOTE) Trazodone  is metabolized in vivo to several metabolites, including pharmacologically active m-CPP, which is excreted in the urine. Immunoassay screens for amphetamines and MDMA have potential cross-reactivity with these compounds and may provide false positive  results.     MDMA (Ecstasy)Ur Screen 05/16/2023 NONE DETECTED  NONE DETECTED Final   Cocaine Metabolite,Ur [place] 05/16/2023 POSITIVE (A)  NONE DETECTED Final   Opiate, Ur Screen 05/16/2023 NONE DETECTED  NONE DETECTED Final   Phencyclidine (PCP) Ur S 05/16/2023 NONE DETECTED  NONE DETECTED Final   Cannabinoid 50 Ng, Ur [place] 05/16/2023 NONE DETECTED  NONE DETECTED Final   Barbiturates, Ur Screen 05/16/2023 NONE DETECTED  NONE DETECTED Final   Benzodiazepine, Ur Scrn 05/16/2023 NONE DETECTED  NONE DETECTED Final   Methadone Scn, Ur 05/16/2023 NONE DETECTED  NONE DETECTED Final   Comment: (NOTE) Tricyclics + metabolites, urine    Cutoff 1000 ng/mL Amphetamines + metabolites, urine  Cutoff 1000 ng/mL MDMA (Ecstasy), urine              Cutoff 500 ng/mL Cocaine Metabolite, urine          Cutoff 300 ng/mL Opiate + metabolites, urine        Cutoff 300 ng/mL Phencyclidine (PCP), urine         Cutoff 25 ng/mL Cannabinoid, urine                  Cutoff 50 ng/mL Barbiturates + metabolites, urine  Cutoff 200 ng/mL Benzodiazepine, urine              Cutoff 200 ng/mL Methadone, urine                   Cutoff 300 ng/mL  The urine drug screen provides only a preliminary, unconfirmed analytical test result and should not be used for non-medical purposes. Clinical consideration and professional judgment should be applied to any positive drug screen result due to possible interfering substances. A more specific alternate chemical method must be used in order to obtain a confirmed analytical result. Gas chromatography / mass spectrometry (GC/MS) is the preferred confirm                          atory method. Performed at [facility], [address] [place], [place] [address]    Sodium 05/17/2023 136  135 - 145 mmol/L Final   Potassium 05/17/2023 3.7  3.5 - 5.1 mmol/L Final   Chloride 05/17/2023 104  98 - 111 mmol/L Final   CO2 05/17/2023 25  22 - 32 mmol/L Final   Glucose, Bld 05/17/2023 124 (H)  70 - 99 mg/dL Final   Glucose reference range applies only to samples taken after fasting for at least 8 hours.   BUN 05/17/2023 12  6 - 20 mg/dL Final   Creatinine, Ser 05/17/2023 0.92  0.61 - 1.24 mg/dL Final   Calcium 60/45/4098 8.6 (L)  8.9 - 10.3 mg/dL Final   GFR, Estimated 05/17/2023 >60  >60 mL/min Final   Comment: (NOTE) Calculated using the CKD-EPI Creatinine Equation (2021)    Anion gap 05/17/2023 7  5 - 15 Final   Performed at [facility], [address] [place], [place] [address]   Magnesium  05/17/2023 2.1  1.7 - 2.4 mg/dL Final   Performed at [facility],  [address] [place], [place] [address]   WBC 05/17/2023 10.4  4.0 - 10.5 K/uL Final   RBC 05/17/2023 4.30  4.22 - 5.81 MIL/uL Final   Hemoglobin 05/17/2023 12.6 (L)  13.0 - 17.0 g/dL Final   HCT 60/45/4098 38.5 (L)  39.0 - 52.0 % Final   MCV 05/17/2023 89.5  80.0 - 100.0 fL Final   MCH 05/17/2023 29.3  26.0 - 34.0 pg  Final   MCHC 05/17/2023 32.7  30.0 - 36.0 g/dL Final   RDW 11/91/4782 15.3  11.5 - 15.5 % Final   Platelets 05/17/2023 311  150 - 400 K/uL Final   nRBC 05/17/2023 0.0  0.0 - 0.2 % Final   Performed at [facility], [address] [place], [place] [address]  Admission on 05/09/2023, Discharged on 05/09/2023  Component Date Value Ref Range Status   SARS Coronavirus 2 by RT PCR 05/09/2023 NEGATIVE  NEGATIVE Final   Influenza A by PCR 05/09/2023 NEGATIVE  NEGATIVE Final   Influenza B by PCR 05/09/2023 NEGATIVE  NEGATIVE Final   Comment: (NOTE) The Xpert Xpress SARS-CoV-2/FLU/RSV plus assay is intended as an aid in the diagnosis of influenza from Nasopharyngeal swab specimens and should not be used as a sole basis for treatment. Nasal washings and aspirates are unacceptable for Xpert Xpress SARS-CoV-2/FLU/RSV testing.  Fact Sheet for Patients: BloggerCourse.com  Fact Sheet for Healthcare Providers: SeriousBroker.it  This test is not yet approved or cleared by the [place]  FDA and has been authorized for detection and/or diagnosis of SARS-CoV-2 by FDA under an Emergency Use Authorization (EUA). This EUA will remain in effect (meaning this test can be used) for the duration of the COVID-19 declaration under Section 564(b)(1) of the Act, 21 U.S.C. section 360bbb-3(b)(1), unless the authorization is terminated or revoked.     Resp Syncytial Virus by PCR 05/09/2023 NEGATIVE  NEGATIVE Final   Comment: (NOTE) Fact Sheet for Patients: BloggerCourse.com  Fact Sheet for Healthcare Providers: SeriousBroker.it  This test is not yet approved or cleared by the [place]  FDA and has been authorized for detection and/or diagnosis of SARS-CoV-2 by FDA under an Emergency Use Authorization (EUA). This EUA will remain in effect (meaning this test can be used) for the duration of  the COVID-19 declaration under Section 564(b)(1) of the Act, 21 U.S.C. section 360bbb-3(b)(1), unless the authorization is terminated or revoked.  Performed at [facility], [address], [place], [place] [address]    Sodium 05/09/2023 139  135 - 145 mmol/L Final   Potassium 05/09/2023 4.1  3.5 - 5.1 mmol/L Final   Chloride 05/09/2023 102  98 - 111 mmol/L Final   CO2 05/09/2023 26  22 - 32 mmol/L Final   Glucose, Bld 05/09/2023 191 (H)  70 - 99 mg/dL Final   Glucose reference range applies only to samples taken after fasting for at least 8 hours.   BUN 05/09/2023 15  6 - 20 mg/dL Final   Creatinine, Ser 05/09/2023 1.08  0.61 - 1.24 mg/dL Final   Calcium 78/46/9629 9.1  8.9 - 10.3 mg/dL Final   Total Protein 52/84/1324 6.7  6.5 - 8.1 g/dL Final   Albumin 40/10/2723 3.8  3.5 - 5.0 g/dL Final   AST 36/64/4034 30  15 - 41 U/L Final   ALT 05/09/2023 24  0 - 44 U/L Final   Alkaline Phosphatase 05/09/2023 57  38 - 126 U/L Final   Total Bilirubin 05/09/2023 1.1  0.0 - 1.2 mg/dL  Final   GFR, Estimated 05/09/2023 >60  >60 mL/min Final   Comment: (NOTE) Calculated using the CKD-EPI Creatinine Equation (2021)    Anion gap 05/09/2023 11  5 - 15 Final   Performed at [facility], [address], [place], [place] [address]   WBC 05/09/2023 10.4  4.0 - 10.5 K/uL Final   RBC 05/09/2023 5.36  4.22 - 5.81 MIL/uL Final   Hemoglobin 05/09/2023 15.5  13.0 - 17.0 g/dL Final   HCT 62/13/0865 47.1  39.0 - 52.0 % Final   MCV 05/09/2023 87.9  80.0 - 100.0 fL Final   MCH 05/09/2023 28.9  26.0 - 34.0 pg Final   MCHC 05/09/2023 32.9  30.0 - 36.0 g/dL Final   RDW 78/46/9629 14.9  11.5 - 15.5 % Final   Platelets 05/09/2023 369  150 - 400 K/uL Final   nRBC 05/09/2023 0.0  0.0 - 0.2 % Final   Performed at [facility], [address] [place], [place] [address]   pH, Ven 05/09/2023 7.4  7.25 - 7.43 Final   pCO2, Ven 05/09/2023 47  44 - 60 mmHg Final   pO2, Ven 05/09/2023 89 (H)  32 - 45  mmHg Final   Bicarbonate 05/09/2023 29.1 (H)  20.0 - 28.0 mmol/L Final   Acid-Base Excess 05/09/2023 3.5 (H)  0.0 - 2.0 mmol/L Final   O2 Saturation 05/09/2023 98.8  % Final   Patient temperature 05/09/2023 37.0   Final   Collection site 05/09/2023 LEFT ANTECUBITAL   Final   IV   Drawn by 05/09/2023 DRAWN BY RN   Final   Performed at [facility], [address], [place], [place] [address]  Admission on 05/04/2023, Discharged on 05/07/2023  Component Date Value Ref Range Status   WBC 05/04/2023 10.0  4.0 - 10.5 K/uL Final   RBC 05/04/2023 5.15  4.22 - 5.81 MIL/uL Final   Hemoglobin 05/04/2023 14.9  13.0 - 17.0 g/dL Final   HCT 10/31/2534 46.2  39.0 - 52.0 % Final   MCV 05/04/2023 89.7  80.0 - 100.0 fL Final   MCH 05/04/2023 28.9  26.0 - 34.0 pg Final   MCHC 05/04/2023 32.3  30.0 - 36.0 g/dL Final   RDW 64/40/3474 14.8  11.5 - 15.5 % Final   Platelets 05/04/2023 342  150 - 400 K/uL Final   nRBC 05/04/2023 0.0  0.0 - 0.2 % Final   Neutrophils Relative % 05/04/2023 58  % Final   Neutro Abs 05/04/2023 5.8  1.7 - 7.7 K/uL Final   Lymphocytes Relative 05/04/2023 29  % Final   Lymphs Abs 05/04/2023 2.9  0.7 - 4.0 K/uL Final   Monocytes Relative 05/04/2023 5  % Final   Monocytes Absolute 05/04/2023 0.5  0.1 - 1.0 K/uL Final   Eosinophils Relative 05/04/2023 7  % Final   Eosinophils Absolute 05/04/2023 0.7 (H)  0.0 - 0.5 K/uL Final   Basophils Relative 05/04/2023 1  % Final   Basophils Absolute 05/04/2023 0.1  0.0 - 0.1 K/uL Final   Immature Granulocytes 05/04/2023 0  % Final   Abs Immature Granulocytes 05/04/2023 0.03  0.00 - 0.07 K/uL Final   Performed at [facility], [address], [place], [place] [address]   Sodium 05/04/2023 139  135 - 145 mmol/L Final   Potassium 05/04/2023 3.8  3.5 - 5.1 mmol/L Final   Chloride 05/04/2023 106  98 - 111 mmol/L Final   CO2 05/04/2023 25  22 - 32 mmol/L Final   Glucose, Bld 05/04/2023 138 (H)  70 - 99 mg/dL Final   Glucose reference  range applies only to samples taken after fasting for at least 8 hours.   BUN 05/04/2023 10  6 - 20 mg/dL Final   Creatinine, Ser 05/04/2023 0.90  0.61 - 1.24 mg/dL Final   Calcium 40/98/1191 9.0  8.9 - 10.3 mg/dL Final   GFR, Estimated 05/04/2023 >60  >60 mL/min Final   Comment: (NOTE) Calculated using the CKD-EPI Creatinine Equation (2021)    Anion gap 05/04/2023 8  5 - 15 Final   Performed at [facility], [address] [place], [place] [address]   pH, Ven 05/04/2023 7.32  7.25 - 7.43 Final   pCO2, Ven 05/04/2023 60  44 - 60 mmHg Final   pO2, Ven 05/04/2023 42  32 - 45 mmHg Final   Bicarbonate 05/04/2023 30.7 (H)  20.0 - 28.0 mmol/L Final   Acid-Base Excess 05/04/2023 2.8 (H)  0.0 - 2.0 mmol/L Final   O2 Saturation 05/04/2023 72.7  % Final   Patient temperature 05/04/2023 36.5   Final   Performed at [facility], [address] [place], [place] [address]   SARS Coronavirus 2 by RT PCR 05/04/2023 NEGATIVE  NEGATIVE Final   Influenza A by PCR 05/04/2023 NEGATIVE  NEGATIVE Final   Influenza B by PCR 05/04/2023 NEGATIVE  NEGATIVE Final   Comment: (NOTE) The Xpert Xpress SARS-CoV-2/FLU/RSV plus assay is intended as an aid in the diagnosis of influenza from Nasopharyngeal swab specimens and should not be used as a sole basis for treatment. Nasal washings and aspirates are unacceptable for Xpert Xpress SARS-CoV-2/FLU/RSV testing.  Fact Sheet for Patients: BloggerCourse.com  Fact Sheet for Healthcare Providers: SeriousBroker.it  This test is not yet approved or cleared by the [place]  FDA and has been authorized for detection and/or diagnosis of SARS-CoV-2 by FDA under an Emergency Use Authorization (EUA). This EUA will remain in effect (meaning this test can be used) for the duration of the COVID-19 declaration under Section 564(b)(1) of the Act, 21 U.S.C. section 360bbb-3(b)(1), unless the authorization is  terminated or revoked.     Resp Syncytial Virus by PCR 05/04/2023 NEGATIVE  NEGATIVE Final   Comment: (NOTE) Fact Sheet for Patients: BloggerCourse.com  Fact Sheet for Healthcare Providers: SeriousBroker.it  This test is not yet approved or cleared by the [place]  FDA and has been authorized for detection and/or diagnosis of SARS-CoV-2 by FDA under an Emergency Use Authorization (EUA). This EUA will remain in effect (meaning this test can be used) for the duration of the COVID-19 declaration under Section 564(b)(1) of the Act, 21 U.S.C. section 360bbb-3(b)(1), unless the authorization is terminated or revoked.  Performed at [facility], [address], [place], [place] [address]    Neisseria Gonorrhea 05/05/2023 Negative   Final   Chlamydia 05/05/2023 Negative   Final   Comment 05/05/2023 Normal Reference Ranger Chlamydia - Negative   Final   Comment 05/05/2023 Normal Reference Range Neisseria Gonorrhea - Negative   Final   HIV Screen 4th Generation wRfx 05/05/2023 Non Reactive  Non Reactive Final   Performed at [facility], [address], [place], [place] [address]   Sodium 05/06/2023 139  135 - 145 mmol/L Final   Potassium 05/06/2023 3.8  3.5 - 5.1 mmol/L Final   Chloride 05/06/2023 107  98 - 111 mmol/L Final   CO2 05/06/2023 23  22 -  32 mmol/L Final   Glucose, Bld 05/06/2023 169 (H)  70 - 99 mg/dL Final   Glucose reference range applies only to samples taken after fasting for at least 8 hours.   BUN 05/06/2023 10  6 - 20 mg/dL Final   Creatinine, Ser 05/06/2023 0.90  0.61 - 1.24 mg/dL Final   Calcium 11/91/4782 9.0  8.9 - 10.3 mg/dL Final   GFR, Estimated 05/06/2023 >60  >60 mL/min Final   Comment: (NOTE) Calculated using the CKD-EPI Creatinine Equation (2021)    Anion gap 05/06/2023 9  5 - 15 Final   Performed at [facility], [address], [place], [place] [address]   WBC 05/06/2023 15.4 (H)   4.0 - 10.5 K/uL Final   RBC 05/06/2023 4.59  4.22 - 5.81 MIL/uL Final   Hemoglobin 05/06/2023 13.2  13.0 - 17.0 g/dL Final   HCT 30/86/5784 40.3  39.0 - 52.0 % Final   MCV 05/06/2023 87.8  80.0 - 100.0 fL Final   MCH 05/06/2023 28.8  26.0 - 34.0 pg Final   MCHC 05/06/2023 32.8  30.0 - 36.0 g/dL Final   RDW 69/62/9528 15.1  11.5 - 15.5 % Final   Platelets 05/06/2023 317  150 - 400 K/uL Final   nRBC 05/06/2023 0.0  0.0 - 0.2 % Final   Performed at [facility], [address], [place], [place] [address]  Admission on 04/29/2023, Discharged on 04/29/2023  Component Date Value Ref Range Status   Sodium 04/29/2023 138  135 - 145 mmol/L Final   Potassium 04/29/2023 3.8  3.5 - 5.1 mmol/L Final   Chloride 04/29/2023 104  98 - 111 mmol/L Final   CO2 04/29/2023 22  22 - 32 mmol/L Final   Glucose, Bld 04/29/2023 101 (H)  70 - 99 mg/dL Final   Glucose reference range applies only to samples taken after fasting for at least 8 hours.   BUN 04/29/2023 20  6 - 20 mg/dL Final   Creatinine, Ser 04/29/2023 1.11  0.61 - 1.24 mg/dL Final   Calcium 40/10/2723 9.5  8.9 - 10.3 mg/dL Final   Total Protein 36/64/4034 8.1  6.5 - 8.1 g/dL Final   Albumin 74/25/9563 4.4  3.5 - 5.0 g/dL Final   AST 87/56/4332 25  15 - 41 U/L Final   ALT 04/29/2023 19  0 - 44 U/L Final   Alkaline Phosphatase 04/29/2023 64  38 - 126 U/L Final   Total Bilirubin 04/29/2023 2.0 (H)  0.0 - 1.2 mg/dL Final   GFR, Estimated 04/29/2023 >60  >60 mL/min Final   Comment: (NOTE) Calculated using the CKD-EPI Creatinine Equation (2021)    Anion gap 04/29/2023 12  5 - 15 Final   Performed at [facility], [address] [place], [place] [address]   Troponin I (High Sensitivity) 04/29/2023 6  <18 ng/L Final   Comment: (NOTE) Elevated high sensitivity troponin I (hsTnI) values and significant  changes across serial measurements may suggest ACS but many other  chronic and acute conditions are known to elevate hsTnI results.   Refer to the "Links" section for chest pain algorithms and additional  guidance. Performed at [facility], [address] [place], [place] [address]    WBC 04/29/2023 9.3  4.0 - 10.5 K/uL Final   RBC 04/29/2023 4.93  4.22 - 5.81 MIL/uL Final   Hemoglobin 04/29/2023 14.3  13.0 - 17.0 g/dL Final   HCT 63/01/6008 42.7  39.0 - 52.0 % Final  MCV 04/29/2023 86.6  80.0 - 100.0 fL Final   MCH 04/29/2023 29.0  26.0 - 34.0 pg Final   MCHC 04/29/2023 33.5  30.0 - 36.0 g/dL Final   RDW 08/65/7846 14.6  11.5 - 15.5 % Final   Platelets 04/29/2023 278  150 - 400 K/uL Final   nRBC 04/29/2023 0.0  0.0 - 0.2 % Final   Neutrophils Relative % 04/29/2023 61  % Final   Neutro Abs 04/29/2023 5.8  1.7 - 7.7 K/uL Final   Lymphocytes Relative 04/29/2023 22  % Final   Lymphs Abs 04/29/2023 2.0  0.7 - 4.0 K/uL Final   Monocytes Relative 04/29/2023 12  % Final   Monocytes Absolute 04/29/2023 1.1 (H)  0.1 - 1.0 K/uL Final   Eosinophils Relative 04/29/2023 4  % Final   Eosinophils Absolute 04/29/2023 0.3  0.0 - 0.5 K/uL Final   Basophils Relative 04/29/2023 1  % Final   Basophils Absolute 04/29/2023 0.1  0.0 - 0.1 K/uL Final   Immature Granulocytes 04/29/2023 0  % Final   Abs Immature Granulocytes 04/29/2023 0.02  0.00 - 0.07 K/uL Final   Performed at [facility], [address] [place], [place] [address]   B Natriuretic Peptide 04/29/2023 21.2  0.0 - 100.0 pg/mL Final   Performed at [facility], [address] [place], [place] [address]  Admission on 02/27/2023, Discharged on 03/15/2023  Component Date Value Ref Range Status   Hgb A1c MFr Bld 03/05/2023 5.6  4.8 - 5.6 % Final   Comment: (NOTE) Pre diabetes:          5.7%-6.4%  Diabetes:              >6.4%  Glycemic control for   <7.0% adults with diabetes    Mean Plasma Glucose 03/05/2023 114.02  mg/dL Final   Performed at [facility], [address] [place], [place] [address]   Cholesterol 03/05/2023 193   0 - 200 mg/dL Final   Triglycerides 02/72/5366 178 (H)  <150 mg/dL Final   HDL 44/03/4740 52  >40 mg/dL Final   Total CHOL/HDL Ratio 03/05/2023 3.7  RATIO Final   VLDL 03/05/2023 36  0 - 40 mg/dL Final   LDL Cholesterol 03/05/2023 105 (H)  0 - 99 mg/dL Final   Comment:        Total Cholesterol/HDL:CHD Risk Coronary Heart Disease Risk Table                     Men   Women  1/2 Average Risk   3.4   3.3  Average Risk       5.0   4.4  2 X Average Risk   9.6   7.1  3 X Average Risk  23.4   11.0        Use the calculated Patient Ratio above and the CHD Risk Table to determine the patient's CHD Risk.        ATP III CLASSIFICATION (LDL):  <100     mg/dL   Optimal  595-638  mg/dL   Near or Above                    Optimal  130-159  mg/dL   Borderline  756-433  mg/dL   High  >295     mg/dL   Very High Performed at [facility], [address] [place], [place] [address]   Admission on 02/27/2023, Discharged on 02/27/2023  Component Date Value Ref Range Status   Sodium 02/27/2023 137  135 - 145 mmol/L Final   Potassium 02/27/2023 3.6  3.5 - 5.1 mmol/L Final   Chloride 02/27/2023 99  98 - 111 mmol/L Final   CO2 02/27/2023 25  22 - 32 mmol/L Final   Glucose, Bld 02/27/2023 106 (H)  70 - 99 mg/dL Final   Glucose reference range applies only to samples taken after fasting for at least 8 hours.   BUN 02/27/2023 11  6 - 20 mg/dL Final   Creatinine, Ser 02/27/2023 0.97  0.61 - 1.24 mg/dL Final   Calcium 96/04/5407 9.5  8.9 - 10.3 mg/dL Final   Total Protein 81/19/1478 7.6  6.5 - 8.1 g/dL Final   Albumin 29/56/2130 4.1  3.5 - 5.0 g/dL Final   AST 86/57/8469 27  15 - 41 U/L Final   ALT 02/27/2023 20  0 - 44 U/L Final   Alkaline Phosphatase 02/27/2023 59  38 - 126 U/L Final   Total Bilirubin 02/27/2023 1.0  0.0 - 1.2 mg/dL Final   GFR, Estimated 02/27/2023 >60  >60 mL/min Final   Comment: (NOTE) Calculated using the CKD-EPI Creatinine Equation (2021)    Anion gap 02/27/2023 13   5 - 15 Final   Performed at [facility], [address] [place], [place] [address]   Alcohol , Ethyl (B) 02/27/2023 <10  <10 mg/dL Final   Comment: (NOTE) Lowest detectable limit for serum alcohol  is 10 mg/dL.  For medical purposes only. Performed at [facility], [address] [place], [place] [address]    Salicylate Lvl 02/27/2023 <7.0 (L)  7.0 - 30.0 mg/dL Final   Performed at [facility], [address] [place], [place] [address]   Acetaminophen  (Tylenol ), Serum 02/27/2023 <10 (L)  10 - 30 ug/mL Final   Comment: (NOTE) Therapeutic concentrations vary significantly. A range of 10-30 ug/mL  may be an effective concentration for many patients. However, some  are best treated at concentrations outside of this range. Acetaminophen  concentrations >150 ug/mL at 4 hours after ingestion  and >50 ug/mL at 12 hours after ingestion are often associated with  toxic reactions.  Performed at [facility], [address] [place], [place] [address]    WBC 02/27/2023 7.0  4.0 - 10.5 K/uL Final   RBC 02/27/2023 4.92  4.22 - 5.81 MIL/uL Final   Hemoglobin 02/27/2023 14.4  13.0 - 17.0 g/dL Final   HCT 66/44/0347 44.1  39.0 - 52.0 % Final   MCV 02/27/2023 89.6  80.0 - 100.0 fL Final   MCH 02/27/2023 29.3  26.0 - 34.0 pg Final   MCHC 02/27/2023 32.7  30.0 - 36.0 g/dL Final   RDW 42/59/5638 14.2  11.5 - 15.5 % Final   Platelets 02/27/2023 413 (H)  150 - 400 K/uL Final   nRBC 02/27/2023 0.0  0.0 - 0.2 % Final   Performed at [facility], [address] [place], [place] [address]   Troponin I (High Sensitivity) 02/27/2023 5  <18 ng/L Final   Comment: (NOTE) Elevated high sensitivity troponin I (hsTnI) values and significant  changes across serial measurements may suggest ACS but many other  chronic and acute conditions are known to elevate hsTnI results.  Refer to the "Links" section for chest pain algorithms and additional   guidance. Performed at [facility], [address] [place], [place] [address]    SARS Coronavirus 2 by RT PCR 02/27/2023 NEGATIVE  NEGATIVE Final  Comment: (NOTE) SARS-CoV-2 target nucleic acids are NOT DETECTED.  The SARS-CoV-2 RNA is generally detectable in upper respiratory specimens during the acute phase of infection. The lowest concentration of SARS-CoV-2 viral copies this assay can detect is 138 copies/mL. A negative result does not preclude SARS-Cov-2 infection and should not be used as the sole basis for treatment or other patient management decisions. A negative result may occur with  improper specimen collection/handling, submission of specimen other than nasopharyngeal swab, presence of viral mutation(s) within the areas targeted by this assay, and inadequate number of viral copies(<138 copies/mL). A negative result must be combined with clinical observations, patient history, and epidemiological information. The expected result is Negative.  Fact Sheet for Patients:  BloggerCourse.com  Fact Sheet for Healthcare Providers:  SeriousBroker.it  This test is no                          t yet approved or cleared by the [place]  FDA and  has been authorized for detection and/or diagnosis of SARS-CoV-2 by FDA under an Emergency Use Authorization (EUA). This EUA will remain  in effect (meaning this test can be used) for the duration of the COVID-19 declaration under Section 564(b)(1) of the Act, 21 U.S.C.section 360bbb-3(b)(1), unless the authorization is terminated  or revoked sooner.       Influenza A by PCR 02/27/2023 NEGATIVE  NEGATIVE Final   Influenza B by PCR 02/27/2023 NEGATIVE  NEGATIVE Final   Comment: (NOTE) The Xpert Xpress SARS-CoV-2/FLU/RSV plus assay is intended as an aid in the diagnosis of influenza from Nasopharyngeal swab specimens and should not be used as a sole basis for  treatment. Nasal washings and aspirates are unacceptable for Xpert Xpress SARS-CoV-2/FLU/RSV testing.  Fact Sheet for Patients: BloggerCourse.com  Fact Sheet for Healthcare Providers: SeriousBroker.it  This test is not yet approved or cleared by the [place]  FDA and has been authorized for detection and/or diagnosis of SARS-CoV-2 by FDA under an Emergency Use Authorization (EUA). This EUA will remain in effect (meaning this test can be used) for the duration of the COVID-19 declaration under Section 564(b)(1) of the Act, 21 U.S.C. section 360bbb-3(b)(1), unless the authorization is terminated or revoked.     Resp Syncytial Virus by PCR 02/27/2023 NEGATIVE  NEGATIVE Final   Comment: (NOTE) Fact Sheet for Patients: BloggerCourse.com  Fact Sheet for Healthcare Providers: SeriousBroker.it  This test is not yet approved or cleared by the [place]  FDA and has been authorized for detection and/or diagnosis of SARS-CoV-2 by FDA under an Emergency Use Authorization (EUA). This EUA will remain in effect (meaning this test can be used) for the duration of the COVID-19 declaration under Section 564(b)(1) of the Act, 21 U.S.C. section 360bbb-3(b)(1), unless the authorization is terminated or revoked.  Performed at [facility], [address] [place], [place] [address]    Troponin I (High Sensitivity) 02/27/2023 5  <18 ng/L Final   Comment: (NOTE) Elevated high sensitivity troponin I (hsTnI) values and significant  changes across serial measurements may suggest ACS but many other  chronic and acute conditions are known to elevate hsTnI results.  Refer to the "Links" section for chest pain algorithms and additional  guidance. Performed at [facility], [address] [place], [place] [address]    Color, Urine 02/27/2023 YELLOW (A)  YELLOW Final    APPearance 02/27/2023 HAZY (A)  CLEAR Final   Specific Gravity, Urine 02/27/2023 1.021  1.005 -  1.030 Final   pH 02/27/2023 8.0  5.0 - 8.0 Final   Glucose, UA 02/27/2023 150 (A)  NEGATIVE mg/dL Final   Hgb urine dipstick 02/27/2023 NEGATIVE  NEGATIVE Final   Bilirubin Urine 02/27/2023 NEGATIVE  NEGATIVE Final   Ketones, ur 02/27/2023 NEGATIVE  NEGATIVE mg/dL Final   Protein, ur 16/10/9602 NEGATIVE  NEGATIVE mg/dL Final   Nitrite 54/09/8117 NEGATIVE  NEGATIVE Final   Leukocytes,Ua 02/27/2023 NEGATIVE  NEGATIVE Final   RBC / HPF 02/27/2023 0-5  0 - 5 RBC/hpf Final   WBC, UA 02/27/2023 0-5  0 - 5 WBC/hpf Final   Bacteria, UA 02/27/2023 NONE SEEN  NONE SEEN Final   Squamous Epithelial / HPF 02/27/2023 0-5  0 - 5 /HPF Final   Mucus 02/27/2023 PRESENT   Final   Performed at [facility], [address] [place], [place] [address]   Specimen source GC/Chlam 02/27/2023 URINE, RANDOM   Final   Chlamydia Tr 02/27/2023 NOT DETECTED  NOT DETECTED Final   N gonorrhoeae 02/27/2023 NOT DETECTED  NOT DETECTED Final   Comment: (NOTE) This CT/NG assay has not been evaluated in patients with a history of  hysterectomy. Performed at [facility], [address] [place], [place] [address]    Tricyclic, Ur Screen 02/27/2023 NONE DETECTED  NONE DETECTED Final   Amphetamines, Ur Screen 02/27/2023 NONE DETECTED  NONE DETECTED Final   MDMA (Ecstasy)Ur Screen 02/27/2023 NONE DETECTED  NONE DETECTED Final   Cocaine Metabolite,Ur [place] 02/27/2023 POSITIVE (A)  NONE DETECTED Final   Opiate, Ur Screen 02/27/2023 NONE DETECTED  NONE DETECTED Final   Phencyclidine (PCP) Ur S 02/27/2023 NONE DETECTED  NONE DETECTED Final   Cannabinoid 50 Ng, Ur [place] 02/27/2023 NONE DETECTED  NONE DETECTED Final   Barbiturates, Ur Screen 02/27/2023 NONE DETECTED  NONE DETECTED Final   Benzodiazepine, Ur Scrn 02/27/2023 POSITIVE (A)  NONE DETECTED Final   Methadone Scn, Ur 02/27/2023 NONE DETECTED  NONE DETECTED Final    Comment: (NOTE) Tricyclics + metabolites, urine    Cutoff 1000 ng/mL Amphetamines + metabolites, urine  Cutoff 1000 ng/mL MDMA (Ecstasy), urine              Cutoff 500 ng/mL Cocaine Metabolite, urine          Cutoff 300 ng/mL Opiate + metabolites, urine        Cutoff 300 ng/mL Phencyclidine (PCP), urine         Cutoff 25 ng/mL Cannabinoid, urine                 Cutoff 50 ng/mL Barbiturates + metabolites, urine  Cutoff 200 ng/mL Benzodiazepine, urine              Cutoff 200 ng/mL Methadone, urine                   Cutoff 300 ng/mL  The urine drug screen provides only a preliminary, unconfirmed analytical test result and should not be used for non-medical purposes. Clinical consideration and professional judgment should be applied to any positive drug screen result due to possible interfering substances. A more specific alternate chemical method must be used in order to obtain a confirmed analytical result. Gas chromatography / mass spectrometry (GC/MS) is the preferred confirm                          atory method. Performed at [facility], [address], [place], [place] [address]  Admission on 02/18/2023, Discharged on 02/19/2023  Component Date Value Ref Range Status   Sodium 02/19/2023 134 (L)  135 - 145 mmol/L Final   Potassium 02/19/2023 4.3  3.5 - 5.1 mmol/L Final   Chloride 02/19/2023 100  98 - 111 mmol/L Final   CO2 02/19/2023 24  22 - 32 mmol/L Final   Glucose, Bld 02/19/2023 213 (H)  70 - 99 mg/dL Final   Glucose reference range applies only to samples taken after fasting for at least 8 hours.   BUN 02/19/2023 15  6 - 20 mg/dL Final   Creatinine, Ser 02/19/2023 0.95  0.61 - 1.24 mg/dL Final   Calcium 40/98/1191 9.5  8.9 - 10.3 mg/dL Final   Total Protein 47/82/9562 6.6  6.5 - 8.1 g/dL Final   Albumin 13/08/6576 3.4 (L)  3.5 - 5.0 g/dL Final   AST 46/96/2952 30  15 - 41 U/L Final   ALT 02/19/2023 19  0 - 44 U/L Final   Alkaline Phosphatase 02/19/2023 60   38 - 126 U/L Final   Total Bilirubin 02/19/2023 0.4  0.0 - 1.2 mg/dL Final   GFR, Estimated 02/19/2023 >60  >60 mL/min Final   Comment: (NOTE) Calculated using the CKD-EPI Creatinine Equation (2021)    Anion gap 02/19/2023 10  5 - 15 Final   Performed at [facility], [address], [place], [place] [address]   Alcohol , Ethyl (B) 02/19/2023 <10  <10 mg/dL Final   Comment: (NOTE) Lowest detectable limit for serum alcohol  is 10 mg/dL.  For medical purposes only. Performed at [facility], [address], [place], [place] [address]    Salicylate Lvl 02/19/2023 <7.0 (L)  7.0 - 30.0 mg/dL Final   Performed at [facility], [address], [place], [place] [address]   Acetaminophen  (Tylenol ), Serum 02/19/2023 <10 (L)  10 - 30 ug/mL Final   Comment: (NOTE) Therapeutic concentrations vary significantly. A range of 10-30 ug/mL  may be an effective concentration for many patients. However, some  are best treated at concentrations outside of this range. Acetaminophen  concentrations >150 ug/mL at 4 hours after ingestion  and >50 ug/mL at 12 hours after ingestion are often associated with  toxic reactions.  Performed at [facility], [address] [place], [place] [address]    WBC 02/19/2023 4.5  4.0 - 10.5 K/uL Final   RBC 02/19/2023 4.73  4.22 - 5.81 MIL/uL Final   Hemoglobin 02/19/2023 13.8  13.0 - 17.0 g/dL Final   HCT 34/74/2595 41.8  39.0 - 52.0 % Final   MCV 02/19/2023 88.4  80.0 - 100.0 fL Final   MCH 02/19/2023 29.2  26.0 - 34.0 pg Final   MCHC 02/19/2023 33.0  30.0 - 36.0 g/dL Final   RDW 63/87/5643 13.6  11.5 - 15.5 % Final   Platelets 02/19/2023 373  150 - 400 K/uL Final   nRBC 02/19/2023 0.0  0.0 - 0.2 % Final   Performed at [facility], [address], [place], [place] [address]   Opiates 02/19/2023 NONE DETECTED  NONE DETECTED Final   Cocaine 02/19/2023 POSITIVE (A)  NONE DETECTED Final   Benzodiazepines 02/19/2023 NONE DETECTED  NONE  DETECTED Final   Amphetamines 02/19/2023 NONE DETECTED  NONE DETECTED Final   Tetrahydrocannabinol 02/19/2023 NONE DETECTED  NONE DETECTED Final   Barbiturates 02/19/2023 NONE DETECTED  NONE DETECTED Final   Comment: (NOTE) DRUG SCREEN FOR MEDICAL PURPOSES ONLY.  IF CONFIRMATION IS  NEEDED FOR ANY PURPOSE, NOTIFY LAB WITHIN 5 DAYS.  LOWEST DETECTABLE LIMITS FOR URINE DRUG SCREEN Drug Class                     Cutoff (ng/mL) Amphetamine and metabolites    1000 Barbiturate and metabolites    200 Benzodiazepine                 200 Opiates and metabolites        300 Cocaine and metabolites        300 THC                            50 Performed at [facility], [address], [place], [place] [address]   Admission on 02/18/2023, Discharged on 02/18/2023  Component Date Value Ref Range Status   Sodium 02/18/2023 137  135 - 145 mmol/L Final   Potassium 02/18/2023 4.1  3.5 - 5.1 mmol/L Final   Chloride 02/18/2023 97 (L)  98 - 111 mmol/L Final   CO2 02/18/2023 25  22 - 32 mmol/L Final   Glucose, Bld 02/18/2023 125 (H)  70 - 99 mg/dL Final   Glucose reference range applies only to samples taken after fasting for at least 8 hours.   BUN 02/18/2023 11  6 - 20 mg/dL Final   Creatinine, Ser 02/18/2023 0.91  0.61 - 1.24 mg/dL Final   Calcium 60/45/4098 9.2  8.9 - 10.3 mg/dL Final   Total Protein 11/91/4782 6.9  6.5 - 8.1 g/dL Final   Albumin 95/62/1308 3.7  3.5 - 5.0 g/dL Final   AST 65/78/4696 36  15 - 41 U/L Final   ALT 02/18/2023 20  0 - 44 U/L Final   Alkaline Phosphatase 02/18/2023 52  38 - 126 U/L Final   Total Bilirubin 02/18/2023 0.7  0.0 - 1.2 mg/dL Final   GFR, Estimated 02/18/2023 >60  >60 mL/min Final   Comment: (NOTE) Calculated using the CKD-EPI Creatinine Equation (2021)    Anion gap 02/18/2023 15  5 - 15 Final   Performed at [facility], [address] [place], [place] [address]   Alcohol , Ethyl (B) 02/18/2023 16 (H)  <10 mg/dL Final   Comment:  (NOTE) Lowest detectable limit for serum alcohol  is 10 mg/dL.  For medical purposes only. Performed at [facility], [address] [place], [place] [address]    WBC 02/18/2023 9.5  4.0 - 10.5 K/uL Final   RBC 02/18/2023 5.09  4.22 - 5.81 MIL/uL Final   Hemoglobin 02/18/2023 14.8  13.0 - 17.0 g/dL Final   HCT 40/10/2723 45.4  39.0 - 52.0 % Final   MCV 02/18/2023 89.2  80.0 - 100.0 fL Final   MCH 02/18/2023 29.1  26.0 - 34.0 pg Final   MCHC 02/18/2023 32.6  30.0 - 36.0 g/dL Final   RDW 36/64/4034 13.6  11.5 - 15.5 % Final   Platelets 02/18/2023 399  150 - 400 K/uL Final   nRBC 02/18/2023 0.0  0.0 - 0.2 % Final   Performed at [facility], [address], [place], [place] [address]   Opiates 02/18/2023 NONE DETECTED  NONE DETECTED Final   Cocaine 02/18/2023 POSITIVE (A)  NONE DETECTED Final   Benzodiazepines 02/18/2023 NONE DETECTED  NONE DETECTED Final   Amphetamines 02/18/2023 NONE DETECTED  NONE DETECTED Final   Tetrahydrocannabinol 02/18/2023 NONE DETECTED  NONE DETECTED Final  Barbiturates 02/18/2023 NONE DETECTED  NONE DETECTED Final   Comment: (NOTE) DRUG SCREEN FOR MEDICAL PURPOSES ONLY.  IF CONFIRMATION IS NEEDED FOR ANY PURPOSE, NOTIFY LAB WITHIN 5 DAYS.  LOWEST DETECTABLE LIMITS FOR URINE DRUG SCREEN Drug Class                     Cutoff (ng/mL) Amphetamine and metabolites    1000 Barbiturate and metabolites    200 Benzodiazepine                 200 Opiates and metabolites        300 Cocaine and metabolites        300 THC                            50 Performed at [facility], [address], [place], [place] [address]    SARS Coronavirus 2 by RT PCR 02/18/2023 NEGATIVE  NEGATIVE Final   Influenza A by PCR 02/18/2023 NEGATIVE  NEGATIVE Final   Influenza B by PCR 02/18/2023 NEGATIVE  NEGATIVE Final   Comment: (NOTE) The Xpert Xpress SARS-CoV-2/FLU/RSV plus assay is intended as an aid in the diagnosis of influenza from Nasopharyngeal swab  specimens and should not be used as a sole basis for treatment. Nasal washings and aspirates are unacceptable for Xpert Xpress SARS-CoV-2/FLU/RSV testing.  Fact Sheet for Patients: BloggerCourse.com  Fact Sheet for Healthcare Providers: SeriousBroker.it  This test is not yet approved or cleared by the [place]  FDA and has been authorized for detection and/or diagnosis of SARS-CoV-2 by FDA under an Emergency Use Authorization (EUA). This EUA will remain in effect (meaning this test can be used) for the duration of the COVID-19 declaration under Section 564(b)(1) of the Act, 21 U.S.C. section 360bbb-3(b)(1), unless the authorization is terminated or revoked.     Resp Syncytial Virus by PCR 02/18/2023 NEGATIVE  NEGATIVE Final   Comment: (NOTE) Fact Sheet for Patients: BloggerCourse.com  Fact Sheet for Healthcare Providers: SeriousBroker.it  This test is not yet approved or cleared by the [place]  FDA and has been authorized for detection and/or diagnosis of SARS-CoV-2 by FDA under an Emergency Use Authorization (EUA). This EUA will remain in effect (meaning this test can be used) for the duration of the COVID-19 declaration under Section 564(b)(1) of the Act, 21 U.S.C. section 360bbb-3(b)(1), unless the authorization is terminated or revoked.  Performed at [facility], [address], [place], [place] [address]     Blood Alcohol  level:  Lab Results  Component Value Date   [facility] <15 06/07/2023   ETH <10 02/27/2023    Metabolic Disorder Labs: Lab Results  Component Value Date   HGBA1C 5.6 03/05/2023   MPG 114.02 03/05/2023   MPG 117 02/02/2009   No results found for: "PROLACTIN" Lab Results  Component Value Date   CHOL 193 03/05/2023   TRIG 178 (H) 03/05/2023   HDL 52 03/05/2023   CHOLHDL 3.7 03/05/2023   VLDL 36 03/05/2023   LDLCALC 105 (H)  03/05/2023   LDLCALC 80 10/31/2022    Therapeutic Lab Levels: No results found for: "LITHIUM" No results found for: "VALPROATE" No results found for: "CBMZ"  Physical Findings   AIMS    Flowsheet Row Admission (Discharged) from 02/02/2016 [facility] Admission (Discharged) from 03/22/2015 in [facility] Admission (Discharged) from 10/05/2014 in [facility] INPATIENT BE[facility]AVIORAL MEDICINE Admission (Discharged) [facility]18[facility]  AIMS Total Score 0 0 0 0      AUDIT    Flowsheet Row Admission (Discharged) from 02/27/2023 in [facility] BEHAVIORAL MEDICINE Admission (Discharged) from 11/17/2022 in [facility] INPATIENT BEHAVIORAL MEDICINE Admission (Discharged) from 10/27/2022 in [facility] INPATIENT BEHAVIORAL MEDICINE Admission (Discharged) from 03/22/2015 in [facility] Admission (Discharged) from 10/05/2014 in [facility] INPATIENT BEHAVIORAL MEDICINE  Alcohol  Use Disorder Identification Test Final Score (AUDIT) 22 14 14 25 18       PHQ2-9    Flowsheet Row ED from 06/08/2023 in [facility] ED from 12/03/2022 in [facility]  PHQ-2 Total Score 4 0  PHQ-9 Total Score 9 0      Flowsheet Row ED from 06/08/2023 in [facility] ED from 06/07/2023 in [facility] Emergency Department at [facility] ED to Hosp-Admission (Discharged) from 05/16/2023 in [facility] 1C MEDICAL TELEMETRY  C-SSRS RISK CATEGORY No Risk No Risk No Risk        Musculoskeletal  Strength & Muscle Tone: within normal limits Gait & Station: normal Patient leans: N/A  Psychiatric Specialty Exam  Presentation  General Appearance:  Appropriate for Environment; Casual  Eye Contact: Fair  Speech: Clear and Coherent  Speech Volume: Normal  Handedness: Right   Mood and Affect   Mood: Anxious  Affect: Congruent   Thought Process  Thought Processes: Coherent  Descriptions of Associations:Intact  Orientation:Full (Time, Place and Person)  Thought Content:Logical  Diagnosis of Schizophrenia or Schizoaffective disorder in past: No  Duration of Psychotic Symptoms: No data recorded  Hallucinations:No data recorded Ideas of Reference:None  Suicidal Thoughts:Suicidal Thoughts: No  Homicidal Thoughts:Homicidal Thoughts: No   Sensorium  Memory: Immediate Fair  Judgment: Fair  Insight: Fair   Art therapist  Concentration: Fair  Attention Span: Fair  Recall: Fair  Fund of Knowledge: Fair  Language: Fair   Psychomotor Activity  Psychomotor Activity:Psychomotor Activity: Normal   Assets  Assets: Resilience   Sleep  Sleep:Sleep: Fair   No data recorded  Physical Exam  Physical Exam Vitals and nursing note reviewed.  HENT:     Nose: Nose normal.  Musculoskeletal:        General: Normal range of motion.  Neurological:     General: No focal deficit present.     Mental Status: He is oriented to person, place, and time.    Review of Systems  Psychiatric/Behavioral:  Positive for depression and substance abuse. Negative for hallucinations, memory loss and suicidal ideas. The patient is nervous/anxious and has insomnia.   All other systems reviewed and are negative.  Blood pressure (!) 146/76, pulse 96, temperature 97.9 F (36.6 C), temperature source Oral, resp. rate 18, SpO2 96%. There is no height or weight on file to calculate BMI.  Treatment Plan Summary: Daily contact with patient to assess and evaluate symptoms and progress in treatment and Medication management - Please see the [facility] for detailed medications. -Continue Abilify 10 mg daily for mood stabilization -Start Inderal 10 mg BID for hypertension/GAD -Continue Prozac  20 mg daily for depressive symptoms -Continue hydroxyzine  25 mg 3 times daily as  needed for breakthrough anxiety -Continue trazodone  50 mg nightly as needed for sleep -Start agitation protocol medication as needed Zyprexa  5 mg IM or p.o. for agitation - Oral thiamine  and MVI replacement - Abstinence from substances encouraged - SW to look into options for outpatient SA treatment at discharge  Labs reviewed: Ordered hemoglobin A1c as last 1 was on 03/1.  Ordered lipid panel and hemoglobin A1c.  Ordered vitamin D , and B12.  Robet Chiquito, NP 06/10/2023 4:10 PM

## 2023-06-10 NOTE — ED Notes (Signed)
 Patient awake and alert on unit sitting in day room eating breakfast.  He remains irritable and guarded on approach.  No agitation.  No withdrawal.  Will monitor.

## 2023-06-10 NOTE — Group Note (Signed)
 Group Topic: Healthy Self Image and Positive Change  Group Date: 06/10/2023 Start Time: 1210 End Time: 1240 Facilitators: Erma Hay, NT  Department: [facility]  Number of Participants: 9  Group Focus: community group Treatment Modality:  Patient-Centered Therapy Interventions utilized were group exercise Purpose: express feelings and increase insight  Name: Franklin [name] Date of Birth: 10-19-1970  MR: [id number]    Level of Participation: active Quality of Participation: cooperative Interactions with others: gave feedback Mood/Affect: appropriate Triggers (if applicable):  Cognition: insightful Progress: Gaining insight Response:  Plan: patient will be encouraged to continue being grateful and having a positive outlook on life.  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

## 2023-06-10 NOTE — Discharge Planning (Signed)
 SW made call to Jan at [facility] and she has reviewed his clinicals and just needs him to call in with phone screen. SW spoke with Valinda Gault who will be returning call back once she gets confirmation on when he can admit next week. Will continue to follow.

## 2023-06-10 NOTE — Group Note (Signed)
 Group Topic: Fears and Unhealthy Coping Skills  Group Date: 06/10/2023 Start Time: 2030 End Time: 2100 Facilitators: Alvino Joseph, NT  Department: [facility]  Number of Participants: 8  Group Focus: triggers Treatment Modality:  Individual Therapy Interventions utilized were group exercise Purpose: trigger / craving management  Name: Franklin [name] Date of Birth: Dec 16, 1970  MR: [id number]    Level of Participation: n/a Quality of Participation: n/a Interactions with others: n/a Mood/Affect: n/a Triggers (if applicable): n/a Cognition: n/a Progress: Other Response: n/a Plan: patient will be encouraged to attend group  Patients Problems:  Patient Active Problem List   Diagnosis Date Noted   Hypokalemia 05/16/2023   A-fib [facility])    STD (male) 05/05/2023   Malingering 02/27/2023   MDD (major depressive disorder) 02/27/2023   Cocaine abuse with cocaine-induced mood disorder (HCC) 12/03/2022   Major depressive disorder, 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

## 2023-06-10 NOTE — ED Notes (Signed)
 Patient is sleeping. Respirations equal and unlabored, skin warm and dry, NAD. No change in assessment or acuity. Routine safety checks conducted according to facility protocol.

## 2023-06-10 NOTE — Group Note (Signed)
 Group Topic: Communication  Group Date: 06/10/2023 Start Time: 1000 End Time: 1045 Facilitators: Ashby Blackwater, NT  Department: [facility]  Number of Participants: 11  Group Focus: communication, problem solving, self-awareness, and social skills Treatment Modality:  Skills Training Interventions utilized were group exercise Purpose: improve communication skills, regain self-worth, and reinforce self-care  Name: Franklin [name] Date of Birth: May 18, 1970  MR: [id number]    Level of Participation: active Quality of Participation: cooperative and engaged Interactions with others: gave feedback Mood/Affect: brightens with interaction Triggers (if applicable): NONE Cognition: insightful Progress: Gaining insight Response: Gained a lot from this group Plan: patient will be encouraged to attend group  Patients Problems:  Patient Active Problem List   Diagnosis Date Noted   Hypokalemia 05/16/2023   A-fib [facility])    STD (male) 05/05/2023   Malingering 02/27/2023   MDD (major depressive disorder) 02/27/2023   Cocaine abuse with cocaine-induced mood disorder (HCC) 12/03/2022   Major depressive disorder, 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

## 2023-06-11 ENCOUNTER — Encounter (HOSPITAL_COMMUNITY): Payer: Self-pay | Admitting: Psychiatry

## 2023-06-11 DIAGNOSIS — F32A Depression, unspecified: Secondary | ICD-10-CM | POA: Diagnosis not present

## 2023-06-11 DIAGNOSIS — F4310 Post-traumatic stress disorder, unspecified: Secondary | ICD-10-CM

## 2023-06-11 DIAGNOSIS — F1424 Cocaine dependence with cocaine-induced mood disorder: Secondary | ICD-10-CM | POA: Diagnosis not present

## 2023-06-11 DIAGNOSIS — Z5900 Homelessness unspecified: Secondary | ICD-10-CM | POA: Diagnosis not present

## 2023-06-11 DIAGNOSIS — F17210 Nicotine dependence, cigarettes, uncomplicated: Secondary | ICD-10-CM | POA: Diagnosis not present

## 2023-06-11 HISTORY — DX: Post-traumatic stress disorder, unspecified: F43.10

## 2023-06-11 LAB — LIPID PANEL
Cholesterol: 184 mg/dL (ref 0–200)
HDL: 50 mg/dL (ref >40)
LDL Cholesterol: 114 mg/dL — ABNORMAL HIGH (ref 0–99)
Total CHOL/HDL Ratio: 3.7 ratio
Triglycerides: 98 mg/dL (ref <150)
VLDL: 20 mg/dL (ref 0–40)

## 2023-06-11 LAB — VITAMIN B12: Vitamin B-12: 310 pg/mL (ref 180–914)

## 2023-06-11 LAB — TSH: TSH: 0.355 u[IU]/mL (ref 0.350–4.500)

## 2023-06-11 LAB — VITAMIN D 25 HYDROXY (VIT D DEFICIENCY, FRACTURES): Vit D, 25-Hydroxy: 27.83 ng/mL — ABNORMAL LOW (ref 30–100)

## 2023-06-11 MED ORDER — NALTREXONE HCL 50 MG PO TABS
25.0000 mg | ORAL_TABLET | Freq: Every day | ORAL | Status: AC
  Administered 2023-06-11: 25 mg via ORAL
  Filled 2023-06-11: qty 1

## 2023-06-11 MED ORDER — FLUTICASONE FUROATE-VILANTEROL 200-25 MCG/ACT IN AEPB
1.0000 | INHALATION_SPRAY | Freq: Every morning | RESPIRATORY_TRACT | Status: DC
  Administered 2023-06-12 – 2023-06-14 (×3): 1 via RESPIRATORY_TRACT
  Filled 2023-06-11: qty 28

## 2023-06-11 MED ORDER — NALTREXONE HCL 50 MG PO TABS
50.0000 mg | ORAL_TABLET | Freq: Every day | ORAL | Status: DC
  Administered 2023-06-12 – 2023-06-13 (×2): 50 mg via ORAL
  Filled 2023-06-11 (×2): qty 1

## 2023-06-11 NOTE — ED Notes (Signed)
 Patient asleep in bed without issue or complaint.  No distress or withdrawal.  Will monitor.

## 2023-06-11 NOTE — ED Provider Notes (Addendum)
 Behavioral Health Progress Note  Date and Time: 06/11/2023 3:04 PM Name: Franklin [name] MRN:  [id number]  HPI: Franklin [name] is a 53 y.o. male with a history of AUD, cocaine use disorder, MDD, GAD who initially presented to the [facility] ER on 06/3 after being found acting erratically outside with a change in mental status. Pt was also very agitated, requiring Versed  to be subdued and transported to the ER for medical evaluation where he was subsequently recommended for inpatient treatment, and transferred to the [facility] for treatment and stabilization of his mental status on 06/04. Main stressor as reported on admission to the [facility] is homelessness and substance abuse.  Subjective: Feels "ok", tired, some low back pain, but not too bad. Appetite intact. Sleep was good. Denied med side effects thus far. Discussed the risk, benefits, side effects of naltrexone and is amenable to starting it. Denied cravings currently. No other concerns.  Denied active and passive SI, HI, AVH   Plans to go to [facility]  Diagnosis:  Final diagnoses:  Substance induced mood disorder (HCC)   Total Time spent with patient: 30 minutes  Sleep: see subjective  Appetite:  see subjective  Current Medications:  Current Facility-Administered Medications  Medication Dose Route Frequency Provider Last Rate Last Admin   ARIPiprazole  (ABILIFY ) tablet 10 mg  10 mg Oral Daily McLauchlin, Angela, NP   10 mg at 06/11/23 0931   FLUoxetine  (PROZAC ) capsule 20 mg  20 mg Oral Daily Lissa Riding, NP   20 mg at 06/11/23 0931   [START ON 06/12/2023] fluticasone  furoate-vilanterol (BREO ELLIPTA ) 200-25 MCG/ACT 1 puff  1 puff Inhalation q AM Arthi Mcdonald, DO       gabapentin  (NEURONTIN ) capsule 300 mg  300 mg Oral TID Lissa Riding, NP   300 mg at 06/11/23 0865   hydrOXYzine  (ATARAX ) tablet 25 mg  25 mg Oral Q6H PRN McLauchlin, Angela, NP       ibuprofen  (ADVIL ) tablet 600 mg  600 mg Oral Q6H PRN Lord, Jamison  Y, NP       loperamide  (IMODIUM ) capsule 2-4 mg  2-4 mg Oral PRN McLauchlin, Angela, NP       multivitamin with minerals tablet 1 tablet  1 tablet Oral Daily McLauchlin, Angela, NP   1 tablet at 06/11/23 0931   naltrexone (DEPADE) tablet 25 mg  25 mg Oral QHS Merian Wroe, DO       Followed by   Cecily Cohen ON 06/12/2023] naltrexone (DEPADE) tablet 50 mg  50 mg Oral QHS Deuce Paternoster, DO       ondansetron  (ZOFRAN -ODT) disintegrating tablet 4 mg  4 mg Oral Q6H PRN McLauchlin, Angela, NP       propranolol  (INDERAL ) tablet 10 mg  10 mg Oral BID Nkwenti, Doris, NP   10 mg at 06/11/23 7846   thiamine  (VITAMIN B1) tablet 100 mg  100 mg Oral Daily McLauchlin, Angela, NP   100 mg at 06/11/23 0931   traZODone  (DESYREL ) tablet 50 mg  50 mg Oral QHS PRN Lord, Jamison Y, NP   50 mg at 06/09/23 2124   Current Outpatient Medications  Medication Sig Dispense Refill   albuterol  (VENTOLIN  HFA) 108 (90 Base) MCG/ACT inhaler Inhale 2 puffs into the lungs every 6 (six) hours as needed for wheezing or shortness of breath. 8 g 0   FLUoxetine  (PROZAC ) 10 MG capsule Take 3 capsules (30 mg total) by mouth daily. 90 capsule 0   fluticasone  furoate-vilanterol (BREO ELLIPTA )  100-25 MCG/ACT AEPB Inhale 1 puff into the lungs daily. 60 each 0   ipratropium-albuterol  (DUONEB) 0.5-2.5 (3) MG/3ML SOLN Take 3 mLs by nebulization every 6 (six) hours as needed. (Patient taking differently: Take 3 mLs by nebulization every 6 (six) hours as needed (For shortness of breath).) 360 mL 0   mometasone -formoterol  (DULERA ) 200-5 MCG/ACT AERO Inhale 2 puffs into the lungs 2 (two) times daily in the am and at bedtime..      Labs  Lab Results:  Admission on 06/08/2023  Component Date Value Ref Range Status   Vit D, 25-Hydroxy 06/11/2023 27.83 (L)  30 - 100 ng/mL Final   Comment: (NOTE) Vitamin D  deficiency has been defined by the Institute of Medicine  and an Endocrine Society practice guideline as a level of serum 25-OH  vitamin D  less than  20 ng/mL (1,2). The Endocrine Society went on to  further define vitamin D  insufficiency as a level between 21 and 29  ng/mL (2).  1. IOM (Institute of Medicine). 2010. Dietary reference intakes for  calcium and D. [place]: [employer]. 2. Holick MF, [place]ink[place], Bischoff-Ferrari HA, et al. Evaluation,  treatment, and prevention of vitamin D  deficiency: an Endocrine  Society clinical practice guideline, JCEM. 2011 Jul; 96(7): 1911-30.  Performed at [facility], [address] [place], [place] [address]    Vitamin B-12 06/11/2023 310  180 - 914 pg/mL Final   Comment: (NOTE) This assay is not validated for testing neonatal or myeloproliferative syndrome specimens for Vitamin B12 levels. Performed at [facility], [address], [place], [place] [address]    TSH 06/11/2023 0.355  0.350 - 4.500 uIU/mL Final   Comment: Performed by a 3rd Generation assay with a functional sensitivity of <=0.01 uIU/mL. Performed at [facility], [address] [place], [place] [address]    Cholesterol 06/11/2023 184  0 - 200 mg/dL Final   Triglycerides 96/29/5284 98  <150 mg/dL Final   HDL 13/24/4010 50  >40 mg/dL Final   Total CHOL/HDL Ratio 06/11/2023 3.7  RATIO Final   VLDL 06/11/2023 20  0 - 40 mg/dL Final   LDL Cholesterol 06/11/2023 114 (H)  0 - 99 mg/dL Final   Comment:        Total Cholesterol/HDL:CHD Risk Coronary Heart Disease Risk Table                     Men   Women  1/2 Average Risk   3.4   3.3  Average Risk       5.0   4.4  2 X Average Risk   9.6   7.1  3 X Average Risk  23.4   11.0        Use the calculated Patient Ratio above and the CHD Risk Table to determine the patient's CHD Risk.        ATP III CLASSIFICATION (LDL):  <100     mg/dL   Optimal  272-536  mg/dL   Near or Above                    Optimal  130-159  mg/dL   Borderline  644-034  mg/dL   High  >742     mg/dL   Very High Performed at [facility], [address] [place], [place] [address]   Admission on 06/07/2023, Discharged on 06/08/2023  Component Date Value Ref Range Status  WBC 06/07/2023 6.9  4.0 - 10.5 K/uL Final   RBC 06/07/2023 4.11 (L)  4.22 - 5.81 MIL/uL Final   Hemoglobin 06/07/2023 11.8 (L)  13.0 - 17.0 g/dL Final   HCT 01/06/7251 35.9 (L)  39.0 - 52.0 % Final   MCV 06/07/2023 87.3  80.0 - 100.0 fL Final   MCH 06/07/2023 28.7  26.0 - 34.0 pg Final   MCHC 06/07/2023 32.9  30.0 - 36.0 g/dL Final   RDW 66/44/0347 15.1  11.5 - 15.5 % Final   Platelets 06/07/2023 249  150 - 400 K/uL Final   nRBC 06/07/2023 0.0  0.0 - 0.2 % Final   Neutrophils Relative % 06/07/2023 66  % Final   Neutro Abs 06/07/2023 4.6  1.7 - 7.7 K/uL Final   Lymphocytes Relative 06/07/2023 22  % Final   Lymphs Abs 06/07/2023 1.5  0.7 - 4.0 K/uL Final   Monocytes Relative 06/07/2023 9  % Final   Monocytes Absolute 06/07/2023 0.6  0.1 - 1.0 K/uL Final   Eosinophils Relative 06/07/2023 2  % Final   Eosinophils Absolute 06/07/2023 0.1  0.0 - 0.5 K/uL Final   Basophils Relative 06/07/2023 1  % Final   Basophils Absolute 06/07/2023 0.0  0.0 - 0.1 K/uL Final   Immature Granulocytes 06/07/2023 0  % Final   Abs Immature Granulocytes 06/07/2023 0.03  0.00 - 0.07 K/uL Final   Performed at [facility], [address] [place], [place] [address]   Sodium 06/07/2023 138  135 - 145 mmol/L Final   Potassium 06/07/2023 3.7  3.5 - 5.1 mmol/L Final   Chloride 06/07/2023 104  98 - 111 mmol/L Final   CO2 06/07/2023 24  22 - 32 mmol/L Final   Glucose, Bld 06/07/2023 154 (H)  70 - 99 mg/dL Final   Glucose reference range applies only to samples taken after fasting for at least 8 hours.   BUN 06/07/2023 14  6 - 20 mg/dL Final   Creatinine, Ser 06/07/2023 0.88  0.61 - 1.24 mg/dL Final   Calcium 63/87/5643 9.3  8.9 - 10.3 mg/dL Final   Total Protein 32/95/1884 6.2 (L)  6.5 - 8.1 g/dL Final   Albumin 16/60/6301 3.7  3.5 - 5.0 g/dL Final   AST 60/10/9321 29  15 - 41 U/L  Final   ALT 06/07/2023 20  0 - 44 U/L Final   Alkaline Phosphatase 06/07/2023 62  38 - 126 U/L Final   Total Bilirubin 06/07/2023 2.0 (H)  0.0 - 1.2 mg/dL Final   GFR, Estimated 06/07/2023 >60  >60 mL/min Final   Comment: (NOTE) Calculated using the CKD-EPI Creatinine Equation (2021)    Anion gap 06/07/2023 10  5 - 15 Final   Performed at [facility], [address] [place], [place] [address]   Salicylate Lvl 06/07/2023 <7.0 (L)  7.0 - 30.0 mg/dL Final   Performed at [facility], [address] [place], [place] [address]   Acetaminophen  (Tylenol ), Serum 06/07/2023 <10 (L)  10 - 30 ug/mL Final   Comment: (NOTE) Therapeutic concentrations vary significantly. A range of 10-30 ug/mL  may be an effective concentration for many patients. However, some  are best treated at concentrations outside of this range. Acetaminophen  concentrations >150 ug/mL at 4 hours after ingestion  and >50 ug/mL at 12 hours after ingestion are often associated with  toxic reactions.  Performed at [facility], [address] [place], [place] [address]    Alcohol , Ethyl (B) 06/07/2023 <  15  <15 mg/dL Final   Comment: (NOTE) For medical purposes only. Performed at [facility], [address] [place], [place] [address]    Tricyclic, Ur Screen 06/08/2023 NONE DETECTED  NONE DETECTED Final   Amphetamines, Ur Screen 06/08/2023 NONE DETECTED  NONE DETECTED Final   MDMA (Ecstasy)Ur Screen 06/08/2023 NONE DETECTED  NONE DETECTED Final   Cocaine Metabolite,Ur [place] 06/08/2023 POSITIVE (A)  NONE DETECTED Final   Opiate, Ur Screen 06/08/2023 NONE DETECTED  NONE DETECTED Final   Phencyclidine (PCP) Ur S 06/08/2023 NONE DETECTED  NONE DETECTED Final   Cannabinoid 50 Ng, Ur [place] 06/08/2023 NONE DETECTED  NONE DETECTED Final   Barbiturates, Ur Screen 06/08/2023 NONE DETECTED  NONE DETECTED Final   Benzodiazepine, Ur Scrn 06/08/2023 POSITIVE (A)  NONE DETECTED Final   Methadone Scn,  Ur 06/08/2023 NONE DETECTED  NONE DETECTED Final   Comment: (NOTE) Tricyclics + metabolites, urine    Cutoff 1000 ng/mL Amphetamines + metabolites, urine  Cutoff 1000 ng/mL MDMA (Ecstasy), urine              Cutoff 500 ng/mL Cocaine Metabolite, urine          Cutoff 300 ng/mL Opiate + metabolites, urine        Cutoff 300 ng/mL Phencyclidine (PCP), urine         Cutoff 25 ng/mL Cannabinoid, urine                 Cutoff 50 ng/mL Barbiturates + metabolites, urine  Cutoff 200 ng/mL Benzodiazepine, urine              Cutoff 200 ng/mL Methadone, urine                   Cutoff 300 ng/mL  The urine drug screen provides only a preliminary, unconfirmed analytical test result and should not be used for non-medical purposes. Clinical consideration and professional judgment should be applied to any positive drug screen result due to possible interfering substances. A more specific alternate chemical method must be used in order to obtain a confirmed analytical result. Gas chromatography / mass spectrometry (GC/MS) is the preferred confirm                          atory method. Performed at [facility], [address] [place], [place] [address]   Admission on 05/16/2023, Discharged on 05/17/2023  Component Date Value Ref Range Status   WBC 05/16/2023 8.2  4.0 - 10.5 K/uL Final   RBC 05/16/2023 4.86  4.22 - 5.81 MIL/uL Final   Hemoglobin 05/16/2023 14.3  13.0 - 17.0 g/dL Final   HCT 09/81/1914 43.8  39.0 - 52.0 % Final   MCV 05/16/2023 90.1  80.0 - 100.0 fL Final   MCH 05/16/2023 29.4  26.0 - 34.0 pg Final   MCHC 05/16/2023 32.6  30.0 - 36.0 g/dL Final   RDW 78/29/5621 15.2  11.5 - 15.5 % Final   Platelets 05/16/2023 348  150 - 400 K/uL Final   nRBC 05/16/2023 0.0  0.0 - 0.2 % Final   Neutrophils Relative % 05/16/2023 61  % Final   Neutro Abs 05/16/2023 5.2  1.7 - 7.7 K/uL Final   Lymphocytes Relative 05/16/2023 30  % Final   Lymphs Abs 05/16/2023 2.4  0.7 - 4.0 K/uL Final    Monocytes Relative 05/16/2023 5  % Final   Monocytes Absolute 05/16/2023 0.4  0.1 - 1.0 K/uL Final  Eosinophils Relative 05/16/2023 3  % Final   Eosinophils Absolute 05/16/2023 0.2  0.0 - 0.5 K/uL Final   Basophils Relative 05/16/2023 1  % Final   Basophils Absolute 05/16/2023 0.1  0.0 - 0.1 K/uL Final   Immature Granulocytes 05/16/2023 0  % Final   Abs Immature Granulocytes 05/16/2023 0.02  0.00 - 0.07 K/uL Final   Performed at [facility], [address] [place], [place] [address]   Sodium 05/16/2023 139  135 - 145 mmol/L Final   Potassium 05/16/2023 3.2 (L)  3.5 - 5.1 mmol/L Final   Chloride 05/16/2023 102  98 - 111 mmol/L Final   CO2 05/16/2023 24  22 - 32 mmol/L Final   Glucose, Bld 05/16/2023 125 (H)  70 - 99 mg/dL Final   Glucose reference range applies only to samples taken after fasting for at least 8 hours.   BUN 05/16/2023 7  6 - 20 mg/dL Final   Creatinine, Ser 05/16/2023 1.08  0.61 - 1.24 mg/dL Final   Calcium 56/21/3086 9.1  8.9 - 10.3 mg/dL Final   GFR, Estimated 05/16/2023 >60  >60 mL/min Final   Comment: (NOTE) Calculated using the CKD-EPI Creatinine Equation (2021)    Anion gap 05/16/2023 13  5 - 15 Final   Performed at [facility], [address] [place], [place] [address]   Troponin I (High Sensitivity) 05/16/2023 6  <18 ng/L Final   Comment: (NOTE) Elevated high sensitivity troponin I (hsTnI) values and significant  changes across serial measurements may suggest ACS but many other  chronic and acute conditions are known to elevate hsTnI results.  Refer to the "Links" section for chest pain algorithms and additional  guidance. Performed at [facility], [address] [place], [place] [address]    SARS Coronavirus 2 by RT PCR 05/16/2023 NEGATIVE  NEGATIVE Final   Comment: (NOTE) SARS-CoV-2 target nucleic acids are NOT DETECTED.  The SARS-CoV-2 RNA is generally detectable in upper respiratory specimens during the acute  phase of infection. The lowest concentration of SARS-CoV-2 viral copies this assay can detect is 138 copies/mL. A negative result does not preclude SARS-Cov-2 infection and should not be used as the sole basis for treatment or other patient management decisions. A negative result may occur with  improper specimen collection/handling, submission of specimen other than nasopharyngeal swab, presence of viral mutation(s) within the areas targeted by this assay, and inadequate number of viral copies(<138 copies/mL). A negative result must be combined with clinical observations, patient history, and epidemiological information. The expected result is Negative.  Fact Sheet for Patients:  BloggerCourse.com  Fact Sheet for Healthcare Providers:  SeriousBroker.it  This test is no                          t yet approved or cleared by the [place]  FDA and  has been authorized for detection and/or diagnosis of SARS-CoV-2 by FDA under an Emergency Use Authorization (EUA). This EUA will remain  in effect (meaning this test can be used) for the duration of the COVID-19 declaration under Section 564(b)(1) of the Act, 21 U.S.C.section 360bbb-3(b)(1), unless the authorization is terminated  or revoked sooner.       Influenza A by PCR 05/16/2023 NEGATIVE  NEGATIVE Final   Influenza B by PCR 05/16/2023 NEGATIVE  NEGATIVE Final   Comment: (NOTE) The Xpert Xpress SARS-CoV-2/FLU/RSV plus assay is intended as an aid in the diagnosis of influenza from Nasopharyngeal  swab specimens and should not be used as a sole basis for treatment. Nasal washings and aspirates are unacceptable for Xpert Xpress SARS-CoV-2/FLU/RSV testing.  Fact Sheet for Patients: BloggerCourse.com  Fact Sheet for Healthcare Providers: SeriousBroker.it  This test is not yet approved or cleared by the [place]  FDA and has  been authorized for detection and/or diagnosis of SARS-CoV-2 by FDA under an Emergency Use Authorization (EUA). This EUA will remain in effect (meaning this test can be used) for the duration of the COVID-19 declaration under Section 564(b)(1) of the Act, 21 U.S.C. section 360bbb-3(b)(1), unless the authorization is terminated or revoked.     Resp Syncytial Virus by PCR 05/16/2023 NEGATIVE  NEGATIVE Final   Comment: (NOTE) Fact Sheet for Patients: BloggerCourse.com  Fact Sheet for Healthcare Providers: SeriousBroker.it  This test is not yet approved or cleared by the [place]  FDA and has been authorized for detection and/or diagnosis of SARS-CoV-2 by FDA under an Emergency Use Authorization (EUA). This EUA will remain in effect (meaning this test can be used) for the duration of the COVID-19 declaration under Section 564(b)(1) of the Act, 21 U.S.C. section 360bbb-3(b)(1), unless the authorization is terminated or revoked.  Performed at [facility], [address] [place], [place] [address]    pH, Ven 05/16/2023 7.38  7.25 - 7.43 Final   pCO2, Ven 05/16/2023 44  44 - 60 mmHg Final   pO2, Ven 05/16/2023 96 (H)  32 - 45 mmHg Final   Bicarbonate 05/16/2023 26.0  20.0 - 28.0 mmol/L Final   Acid-Base Excess 05/16/2023 0.5  0.0 - 2.0 mmol/L Final   O2 Saturation 05/16/2023 98.7  % Final   Patient temperature 05/16/2023 37.0   Final   Collection site 05/16/2023 VEIN   Final   Performed at [facility], [address] [place], [place] [address]   Troponin I (High Sensitivity) 05/16/2023 5  <18 ng/L Final   Comment: (NOTE) Elevated high sensitivity troponin I (hsTnI) values and significant  changes across serial measurements may suggest ACS but many other  chronic and acute conditions are known to elevate hsTnI results.  Refer to the "Links" section for chest pain algorithms and additional   guidance. Performed at [facility], [address] [place], [place] [address]    Tricyclic, Ur Screen 05/16/2023 NONE DETECTED  NONE DETECTED Final   Amphetamines, Ur Screen 05/16/2023 POSITIVE (A)  NONE DETECTED Final   Comment: (NOTE) Trazodone  is metabolized in vivo to several metabolites, including pharmacologically active m-CPP, which is excreted in the urine. Immunoassay screens for amphetamines and MDMA have potential cross-reactivity with these compounds and may provide false positive  results.     MDMA (Ecstasy)Ur Screen 05/16/2023 NONE DETECTED  NONE DETECTED Final   Cocaine Metabolite,Ur [place] 05/16/2023 POSITIVE (A)  NONE DETECTED Final   Opiate, Ur Screen 05/16/2023 NONE DETECTED  NONE DETECTED Final   Phencyclidine (PCP) Ur S 05/16/2023 NONE DETECTED  NONE DETECTED Final   Cannabinoid 50 Ng, Ur [place] 05/16/2023 NONE DETECTED  NONE DETECTED Final   Barbiturates, Ur Screen 05/16/2023 NONE DETECTED  NONE DETECTED Final   Benzodiazepine, Ur Scrn 05/16/2023 NONE DETECTED  NONE DETECTED Final   Methadone Scn, Ur 05/16/2023 NONE DETECTED  NONE DETECTED Final   Comment: (NOTE) Tricyclics + metabolites, urine    Cutoff 1000 ng/mL Amphetamines + metabolites, urine  Cutoff 1000 ng/mL MDMA (Ecstasy), urine              Cutoff 500 ng/mL  Cocaine Metabolite, urine          Cutoff 300 ng/mL Opiate + metabolites, urine        Cutoff 300 ng/mL Phencyclidine (PCP), urine         Cutoff 25 ng/mL Cannabinoid, urine                 Cutoff 50 ng/mL Barbiturates + metabolites, urine  Cutoff 200 ng/mL Benzodiazepine, urine              Cutoff 200 ng/mL Methadone, urine                   Cutoff 300 ng/mL  The urine drug screen provides only a preliminary, unconfirmed analytical test result and should not be used for non-medical purposes. Clinical consideration and professional judgment should be applied to any positive drug screen result due to possible interfering substances. A  more specific alternate chemical method must be used in order to obtain a confirmed analytical result. Gas chromatography / mass spectrometry (GC/MS) is the preferred confirm                          atory method. Performed at [facility], [address] [place], [place] [address]    Sodium 05/17/2023 136  135 - 145 mmol/L Final   Potassium 05/17/2023 3.7  3.5 - 5.1 mmol/L Final   Chloride 05/17/2023 104  98 - 111 mmol/L Final   CO2 05/17/2023 25  22 - 32 mmol/L Final   Glucose, Bld 05/17/2023 124 (H)  70 - 99 mg/dL Final   Glucose reference range applies only to samples taken after fasting for at least 8 hours.   BUN 05/17/2023 12  6 - 20 mg/dL Final   Creatinine, Ser 05/17/2023 0.92  0.61 - 1.24 mg/dL Final   Calcium 60/45/4098 8.6 (L)  8.9 - 10.3 mg/dL Final   GFR, Estimated 05/17/2023 >60  >60 mL/min Final   Comment: (NOTE) Calculated using the CKD-EPI Creatinine Equation (2021)    Anion gap 05/17/2023 7  5 - 15 Final   Performed at [facility], [address] [place], [place] [address]   Magnesium  05/17/2023 2.1  1.7 - 2.4 mg/dL Final   Performed at [facility], [address] [place], [place] [address]   WBC 05/17/2023 10.4  4.0 - 10.5 K/uL Final   RBC 05/17/2023 4.30  4.22 - 5.81 MIL/uL Final   Hemoglobin 05/17/2023 12.6 (L)  13.0 - 17.0 g/dL Final   HCT 62/13/0865 38.5 (L)  39.0 - 52.0 % Final   MCV 05/17/2023 89.5  80.0 - 100.0 fL Final   MCH 05/17/2023 29.3  26.0 - 34.0 pg Final   MCHC 05/17/2023 32.7  30.0 - 36.0 g/dL Final   RDW 78/46/9629 15.3  11.5 - 15.5 % Final   Platelets 05/17/2023 311  150 - 400 K/uL Final   nRBC 05/17/2023 0.0  0.0 - 0.2 % Final   Performed at [facility], [address] [place], [place] [address]  Admission on 05/09/2023, Discharged on 05/09/2023  Component Date Value Ref Range Status   SARS Coronavirus 2 by RT PCR 05/09/2023 NEGATIVE  NEGATIVE Final   Influenza A by PCR 05/09/2023 NEGATIVE   NEGATIVE Final   Influenza B by PCR 05/09/2023 NEGATIVE  NEGATIVE Final   Comment: (NOTE) The Xpert Xpress SARS-CoV-2/FLU/RSV plus assay is intended as an aid in the diagnosis of influenza from Nasopharyngeal  swab specimens and should not be used as a sole basis for treatment. Nasal washings and aspirates are unacceptable for Xpert Xpress SARS-CoV-2/FLU/RSV testing.  Fact Sheet for Patients: BloggerCourse.com  Fact Sheet for Healthcare Providers: SeriousBroker.it  This test is not yet approved or cleared by the [place]  FDA and has been authorized for detection and/or diagnosis of SARS-CoV-2 by FDA under an Emergency Use Authorization (EUA). This EUA will remain in effect (meaning this test can be used) for the duration of the COVID-19 declaration under Section 564(b)(1) of the Act, 21 U.S.C. section 360bbb-3(b)(1), unless the authorization is terminated or revoked.     Resp Syncytial Virus by PCR 05/09/2023 NEGATIVE  NEGATIVE Final   Comment: (NOTE) Fact Sheet for Patients: BloggerCourse.com  Fact Sheet for Healthcare Providers: SeriousBroker.it  This test is not yet approved or cleared by the [place]  FDA and has been authorized for detection and/or diagnosis of SARS-CoV-2 by FDA under an Emergency Use Authorization (EUA). This EUA will remain in effect (meaning this test can be used) for the duration of the COVID-19 declaration under Section 564(b)(1) of the Act, 21 U.S.C. section 360bbb-3(b)(1), unless the authorization is terminated or revoked.  Performed at [facility], [address] [place], [place] [address]    Sodium 05/09/2023 139  135 - 145 mmol/L Final   Potassium 05/09/2023 4.1  3.5 - 5.1 mmol/L Final   Chloride 05/09/2023 102  98 - 111 mmol/L Final   CO2 05/09/2023 26  22 - 32 mmol/L Final   Glucose, Bld 05/09/2023 191 (H)  70 - 99 mg/dL  Final   Glucose reference range applies only to samples taken after fasting for at least 8 hours.   BUN 05/09/2023 15  6 - 20 mg/dL Final   Creatinine, Ser 05/09/2023 1.08  0.61 - 1.24 mg/dL Final   Calcium 60/45/4098 9.1  8.9 - 10.3 mg/dL Final   Total Protein 11/91/4782 6.7  6.5 - 8.1 g/dL Final   Albumin 95/62/1308 3.8  3.5 - 5.0 g/dL Final   AST 65/78/4696 30  15 - 41 U/L Final   ALT 05/09/2023 24  0 - 44 U/L Final   Alkaline Phosphatase 05/09/2023 57  38 - 126 U/L Final   Total Bilirubin 05/09/2023 1.1  0.0 - 1.2 mg/dL Final   GFR, Estimated 05/09/2023 >60  >60 mL/min Final   Comment: (NOTE) Calculated using the CKD-EPI Creatinine Equation (2021)    Anion gap 05/09/2023 11  5 - 15 Final   Performed at [facility] [facility], [address] [place], [place] [address]   WBC 05/09/2023 10.4  4.0 - 10.5 K/uL Final   RBC 05/09/2023 5.36  4.22 - 5.81 MIL/uL Final   Hemoglobin 05/09/2023 15.5  13.0 - 17.0 g/dL Final   HCT 41/32/4401 47.1  39.0 - 52.0 % Final   MCV 05/09/2023 87.9  80.0 - 100.0 fL Final   MCH 05/09/2023 28.9  26.0 - 34.0 pg Final   MCHC 05/09/2023 32.9  30.0 - 36.0 g/dL Final   RDW 02/72/5366 14.9  11.5 - 15.5 % Final   Platelets 05/09/2023 369  150 - 400 K/uL Final   nRBC 05/09/2023 0.0  0.0 - 0.2 % Final   Performed at [facility], [address], [place], [place] [address]   pH, Ven 05/09/2023 7.4  7.25 - 7.43 Final   pCO2, Ven 05/09/2023 47  44 - 60 mmHg Final   pO2, Ven 05/09/2023 89 (H)  32 - 45 mmHg Final   Bicarbonate 05/09/2023 29.1 (H)  20.0 - 28.0 mmol/L Final   Acid-Base Excess 05/09/2023 3.5 (H)  0.0 - 2.0 mmol/L Final   O2 Saturation 05/09/2023 98.8  % Final   Patient temperature 05/09/2023 37.0   Final   Collection site 05/09/2023 LEFT ANTECUBITAL   Final   IV   Drawn by 05/09/2023 DRAWN BY RN   Final   Performed at [facility], [address], [place], [place] [address]  Admission on 05/04/2023, Discharged on 05/07/2023  Component Date  Value Ref Range Status   WBC 05/04/2023 10.0  4.0 - 10.5 K/uL Final   RBC 05/04/2023 5.15  4.22 - 5.81 MIL/uL Final   Hemoglobin 05/04/2023 14.9  13.0 - 17.0 g/dL Final   HCT 33/29/5188 46.2  39.0 - 52.0 % Final   MCV 05/04/2023 89.7  80.0 - 100.0 fL Final   MCH 05/04/2023 28.9  26.0 - 34.0 pg Final   MCHC 05/04/2023 32.3  30.0 - 36.0 g/dL Final   RDW 41/66/0630 14.8  11.5 - 15.5 % Final   Platelets 05/04/2023 342  150 - 400 K/uL Final   nRBC 05/04/2023 0.0  0.0 - 0.2 % Final   Neutrophils Relative % 05/04/2023 58  % Final   Neutro Abs 05/04/2023 5.8  1.7 - 7.7 K/uL Final   Lymphocytes Relative 05/04/2023 29  % Final   Lymphs Abs 05/04/2023 2.9  0.7 - 4.0 K/uL Final   Monocytes Relative 05/04/2023 5  % Final   Monocytes Absolute 05/04/2023 0.5  0.1 - 1.0 K/uL Final   Eosinophils Relative 05/04/2023 7  % Final   Eosinophils Absolute 05/04/2023 0.7 (H)  0.0 - 0.5 K/uL Final   Basophils Relative 05/04/2023 1  % Final   Basophils Absolute 05/04/2023 0.1  0.0 - 0.1 K/uL Final   Immature Granulocytes 05/04/2023 0  % Final   Abs Immature Granulocytes 05/04/2023 0.03  0.00 - 0.07 K/uL Final   Performed at [facility], [address], [place], [place] [address]   Sodium 05/04/2023 139  135 - 145 mmol/L Final   Potassium 05/04/2023 3.8  3.5 - 5.1 mmol/L Final   Chloride 05/04/2023 106  98 - 111 mmol/L Final   CO2 05/04/2023 25  22 - 32 mmol/L Final   Glucose, Bld 05/04/2023 138 (H)  70 - 99 mg/dL Final   Glucose reference range applies only to samples taken after fasting for at least 8 hours.   BUN 05/04/2023 10  6 - 20 mg/dL Final   Creatinine, Ser 05/04/2023 0.90  0.61 - 1.24 mg/dL Final   Calcium 93/23/5573 9.0  8.9 - 10.3 mg/dL Final   GFR, Estimated 05/04/2023 >60  >60 mL/min Final   Comment: (NOTE) Calculated using the CKD-EPI Creatinine Equation (2021)    Anion gap 05/04/2023 8  5 - 15 Final   Performed at [facility], [address] [place], [place] [address]   pH,  Ven 05/04/2023 7.32  7.25 - 7.43 Final   pCO2, Ven 05/04/2023 60  44 - 60 mmHg Final   pO2, Ven 05/04/2023 42  32 - 45 mmHg Final   Bicarbonate 05/04/2023 30.7 (H)  20.0 - 28.0 mmol/L Final   Acid-Base Excess 05/04/2023 2.8 (H)  0.0 - 2.0 mmol/L Final   O2 Saturation 05/04/2023 72.7  % Final   Patient temperature 05/04/2023 36.5   Final   Performed at [facility], [address] [place], [place] [address]   SARS Coronavirus 2 by RT PCR 05/04/2023 NEGATIVE  NEGATIVE Final   Influenza A by PCR 05/04/2023 NEGATIVE  NEGATIVE Final   Influenza B by PCR 05/04/2023 NEGATIVE  NEGATIVE Final   Comment: (NOTE) The Xpert Xpress SARS-CoV-2/FLU/RSV plus assay is intended as an aid in the diagnosis of influenza from Nasopharyngeal swab specimens and should not be used as a sole basis for treatment. Nasal washings and aspirates are unacceptable for Xpert Xpress SARS-CoV-2/FLU/RSV testing.  Fact Sheet for Patients: BloggerCourse.com  Fact Sheet for Healthcare Providers: SeriousBroker.it  This test is not yet approved or cleared by the [place]  FDA and has been authorized for detection and/or diagnosis of SARS-CoV-2 by FDA under an Emergency Use Authorization (EUA). This EUA will remain in effect (meaning this test can be used) for the duration of the COVID-19 declaration under Section 564(b)(1) of the Act, 21 U.S.C. section 360bbb-3(b)(1), unless the authorization is terminated or revoked.     Resp Syncytial Virus by PCR 05/04/2023 NEGATIVE  NEGATIVE Final   Comment: (NOTE) Fact Sheet for Patients: BloggerCourse.com  Fact Sheet for Healthcare Providers: SeriousBroker.it  This test is not yet approved or cleared by the [place]  FDA and has been authorized for detection and/or diagnosis of SARS-CoV-2 by FDA under an Emergency Use Authorization (EUA). This EUA will remain in  effect (meaning this test can be used) for the duration of the COVID-19 declaration under Section 564(b)(1) of the Act, 21 U.S.C. section 360bbb-3(b)(1), unless the authorization is terminated or revoked.  Performed at [facility], [address] [place], [place] [address]    Neisseria Gonorrhea 05/05/2023 Negative   Final   Chlamydia 05/05/2023 Negative   Final   Comment 05/05/2023 Normal Reference Ranger Chlamydia - Negative   Final   Comment 05/05/2023 Normal Reference Range Neisseria Gonorrhea - Negative   Final   HIV Screen 4th Generation wRfx 05/05/2023 Non Reactive  Non Reactive Final   Performed at [facility], [address], [place], [place] [address]   Sodium 05/06/2023 139  135 - 145 mmol/L Final   Potassium 05/06/2023 3.8  3.5 - 5.1 mmol/L Final   Chloride 05/06/2023 107  98 - 111 mmol/L Final   CO2 05/06/2023 23  22 - 32 mmol/L Final   Glucose, Bld 05/06/2023 169 (H)  70 - 99 mg/dL Final   Glucose reference range applies only to samples taken after fasting for at least 8 hours.   BUN 05/06/2023 10  6 - 20 mg/dL Final   Creatinine, Ser 05/06/2023 0.90  0.61 - 1.24 mg/dL Final   Calcium 21/30/8657 9.0  8.9 - 10.3 mg/dL Final   GFR, Estimated 05/06/2023 >60  >60 mL/min Final   Comment: (NOTE) Calculated using the CKD-EPI Creatinine Equation (2021)    Anion gap 05/06/2023 9  5 - 15 Final   Performed at [facility], [address], [place], [place] [address]   WBC 05/06/2023 15.4 (H)  4.0 - 10.5 K/uL Final   RBC 05/06/2023 4.59  4.22 - 5.81 MIL/uL Final   Hemoglobin 05/06/2023 13.2  13.0 - 17.0 g/dL Final   HCT 29/52/8413 40.3  39.0 - 52.0 % Final   MCV 05/06/2023 87.8  80.0 - 100.0 fL Final   MCH 05/06/2023 28.8  26.0 - 34.0 pg Final   MCHC 05/06/2023 32.8  30.0 - 36.0 g/dL Final   RDW 24/40/1027 15.1  11.5 - 15.5 % Final  Platelets 05/06/2023 317  150 - 400 K/uL Final   nRBC 05/06/2023 0.0  0.0 - 0.2 % Final   Performed at [facility]  [facility], [address], [place], [place] [address]  Admission on 04/29/2023, Discharged on 04/29/2023  Component Date Value Ref Range Status   Sodium 04/29/2023 138  135 - 145 mmol/L Final   Potassium 04/29/2023 3.8  3.5 - 5.1 mmol/L Final   Chloride 04/29/2023 104  98 - 111 mmol/L Final   CO2 04/29/2023 22  22 - 32 mmol/L Final   Glucose, Bld 04/29/2023 101 (H)  70 - 99 mg/dL Final   Glucose reference range applies only to samples taken after fasting for at least 8 hours.   BUN 04/29/2023 20  6 - 20 mg/dL Final   Creatinine, Ser 04/29/2023 1.11  0.61 - 1.24 mg/dL Final   Calcium 60/45/4098 9.5  8.9 - 10.3 mg/dL Final   Total Protein 11/91/4782 8.1  6.5 - 8.1 g/dL Final   Albumin 95/62/1308 4.4  3.5 - 5.0 g/dL Final   AST 65/78/4696 25  15 - 41 U/L Final   ALT 04/29/2023 19  0 - 44 U/L Final   Alkaline Phosphatase 04/29/2023 64  38 - 126 U/L Final   Total Bilirubin 04/29/2023 2.0 (H)  0.0 - 1.2 mg/dL Final   GFR, Estimated 04/29/2023 >60  >60 mL/min Final   Comment: (NOTE) Calculated using the CKD-EPI Creatinine Equation (2021)    Anion gap 04/29/2023 12  5 - 15 Final   Performed at [facility], [address] [place], [place] [address]   Troponin I (High Sensitivity) 04/29/2023 6  <18 ng/L Final   Comment: (NOTE) Elevated high sensitivity troponin I (hsTnI) values and significant  changes across serial measurements may suggest ACS but many other  chronic and acute conditions are known to elevate hsTnI results.  Refer to the "Links" section for chest pain algorithms and additional  guidance. Performed at [facility], [address] [place], [place] [address]    WBC 04/29/2023 9.3  4.0 - 10.5 K/uL Final   RBC 04/29/2023 4.93  4.22 - 5.81 MIL/uL Final   Hemoglobin 04/29/2023 14.3  13.0 - 17.0 g/dL Final   HCT 40/10/2723 42.7  39.0 - 52.0 % Final   MCV 04/29/2023 86.6  80.0 - 100.0 fL Final   MCH 04/29/2023 29.0  26.0 - 34.0 pg Final   MCHC 04/29/2023 33.5  30.0 -  36.0 g/dL Final   RDW 36/64/4034 14.6  11.5 - 15.5 % Final   Platelets 04/29/2023 278  150 - 400 K/uL Final   nRBC 04/29/2023 0.0  0.0 - 0.2 % Final   Neutrophils Relative % 04/29/2023 61  % Final   Neutro Abs 04/29/2023 5.8  1.7 - 7.7 K/uL Final   Lymphocytes Relative 04/29/2023 22  % Final   Lymphs Abs 04/29/2023 2.0  0.7 - 4.0 K/uL Final   Monocytes Relative 04/29/2023 12  % Final   Monocytes Absolute 04/29/2023 1.1 (H)  0.1 - 1.0 K/uL Final   Eosinophils Relative 04/29/2023 4  % Final   Eosinophils Absolute 04/29/2023 0.3  0.0 - 0.5 K/uL Final   Basophils Relative 04/29/2023 1  % Final   Basophils Absolute 04/29/2023 0.1  0.0 - 0.1 K/uL Final   Immature Granulocytes 04/29/2023 0  % Final   Abs Immature Granulocytes 04/29/2023 0.02  0.00 - 0.07 K/uL Final   Performed at [facility], [address]  [place], [place] [address]   B Natriuretic Peptide 04/29/2023 21.2  0.0 - 100.0 pg/mL Final   Performed at [facility], [address] [place], [place] [address]  Admission on 02/27/2023, Discharged on 03/15/2023  Component Date Value Ref Range Status   Hgb A1c MFr Bld 03/05/2023 5.6  4.8 - 5.6 % Final   Comment: (NOTE) Pre diabetes:          5.7%-6.4%  Diabetes:              >6.4%  Glycemic control for   <7.0% adults with diabetes    Mean Plasma Glucose 03/05/2023 114.02  mg/dL Final   Performed at [facility], [address], [place], [place] [address]   Cholesterol 03/05/2023 193  0 - 200 mg/dL Final   Triglycerides 21/30/8657 178 (H)  <150 mg/dL Final   HDL 84/69/6295 52  >40 mg/dL Final   Total CHOL/HDL Ratio 03/05/2023 3.7  RATIO Final   VLDL 03/05/2023 36  0 - 40 mg/dL Final   LDL Cholesterol 03/05/2023 105 (H)  0 - 99 mg/dL Final   Comment:        Total Cholesterol/HDL:CHD Risk Coronary Heart Disease Risk Table                     Men   Women  1/2 Average Risk   3.4   3.3  Average Risk       5.0   4.4  2 X Average Risk   9.6   7.1  3 X  Average Risk  23.4   11.0        Use the calculated Patient Ratio above and the CHD Risk Table to determine the patient's CHD Risk.        ATP III CLASSIFICATION (LDL):  <100     mg/dL   Optimal  284-132  mg/dL   Near or Above                    Optimal  130-159  mg/dL   Borderline  440-102  mg/dL   High  >725     mg/dL   Very High Performed at [facility], [address] [place], [place] [address]   Admission on 02/27/2023, Discharged on 02/27/2023  Component Date Value Ref Range Status   Sodium 02/27/2023 137  135 - 145 mmol/L Final   Potassium 02/27/2023 3.6  3.5 - 5.1 mmol/L Final   Chloride 02/27/2023 99  98 - 111 mmol/L Final   CO2 02/27/2023 25  22 - 32 mmol/L Final   Glucose, Bld 02/27/2023 106 (H)  70 - 99 mg/dL Final   Glucose reference range applies only to samples taken after fasting for at least 8 hours.   BUN 02/27/2023 11  6 - 20 mg/dL Final   Creatinine, Ser 02/27/2023 0.97  0.61 - 1.24 mg/dL Final   Calcium 03/47/4259 9.5  8.9 - 10.3 mg/dL Final   Total Protein 56/38/7564 7.6  6.5 - 8.1 g/dL Final   Albumin 33/29/5188 4.1  3.5 - 5.0 g/dL Final   AST 41/66/0630 27  15 - 41 U/L Final   ALT 02/27/2023 20  0 - 44 U/L Final   Alkaline Phosphatase 02/27/2023 59  38 - 126 U/L Final   Total Bilirubin 02/27/2023 1.0  0.0 - 1.2 mg/dL Final   GFR, Estimated 02/27/2023 >60  >60 mL/min Final   Comment: (NOTE) Calculated using the CKD-EPI Creatinine Equation (  2021)    Anion gap 02/27/2023 13  5 - 15 Final   Performed at [facility], [address] [place], [place] [address]   Alcohol , Ethyl (B) 02/27/2023 <10  <10 mg/dL Final   Comment: (NOTE) Lowest detectable limit for serum alcohol  is 10 mg/dL.  For medical purposes only. Performed at [facility], [address] [place], [place] [address]    Salicylate Lvl 02/27/2023 <7.0 (L)  7.0 - 30.0 mg/dL Final   Performed at [facility], [address] [place], [place]  [address]   Acetaminophen  (Tylenol ), Serum 02/27/2023 <10 (L)  10 - 30 ug/mL Final   Comment: (NOTE) Therapeutic concentrations vary significantly. A range of 10-30 ug/mL  may be an effective concentration for many patients. However, some  are best treated at concentrations outside of this range. Acetaminophen  concentrations >150 ug/mL at 4 hours after ingestion  and >50 ug/mL at 12 hours after ingestion are often associated with  toxic reactions.  Performed at [facility], [address] [place], [place] [address]    WBC 02/27/2023 7.0  4.0 - 10.5 K/uL Final   RBC 02/27/2023 4.92  4.22 - 5.81 MIL/uL Final   Hemoglobin 02/27/2023 14.4  13.0 - 17.0 g/dL Final   HCT 57/84/6962 44.1  39.0 - 52.0 % Final   MCV 02/27/2023 89.6  80.0 - 100.0 fL Final   MCH 02/27/2023 29.3  26.0 - 34.0 pg Final   MCHC 02/27/2023 32.7  30.0 - 36.0 g/dL Final   RDW 95/28/4132 14.2  11.5 - 15.5 % Final   Platelets 02/27/2023 413 (H)  150 - 400 K/uL Final   nRBC 02/27/2023 0.0  0.0 - 0.2 % Final   Performed at [facility], [address] [place], [place] [address]   Troponin I (High Sensitivity) 02/27/2023 5  <18 ng/L Final   Comment: (NOTE) Elevated high sensitivity troponin I (hsTnI) values and significant  changes across serial measurements may suggest ACS but many other  chronic and acute conditions are known to elevate hsTnI results.  Refer to the "Links" section for chest pain algorithms and additional  guidance. Performed at [facility], [address] [place], [place] [address]    SARS Coronavirus 2 by RT PCR 02/27/2023 NEGATIVE  NEGATIVE Final   Comment: (NOTE) SARS-CoV-2 target nucleic acids are NOT DETECTED.  The SARS-CoV-2 RNA is generally detectable in upper respiratory specimens during the acute phase of infection. The lowest concentration of SARS-CoV-2 viral copies this assay can detect is 138 copies/mL. A negative result does not preclude  SARS-Cov-2 infection and should not be used as the sole basis for treatment or other patient management decisions. A negative result may occur with  improper specimen collection/handling, submission of specimen other than nasopharyngeal swab, presence of viral mutation(s) within the areas targeted by this assay, and inadequate number of viral copies(<138 copies/mL). A negative result must be combined with clinical observations, patient history, and epidemiological information. The expected result is Negative.  Fact Sheet for Patients:  BloggerCourse.com  Fact Sheet for Healthcare Providers:  SeriousBroker.it  This test is no                          t yet approved or cleared by the [place]  FDA and  has been authorized for detection and/or diagnosis of SARS-CoV-2 by FDA under an Emergency Use Authorization (EUA). This EUA will remain  in effect (meaning this  test can be used) for the duration of the COVID-19 declaration under Section 564(b)(1) of the Act, 21 U.S.C.section 360bbb-3(b)(1), unless the authorization is terminated  or revoked sooner.       Influenza A by PCR 02/27/2023 NEGATIVE  NEGATIVE Final   Influenza B by PCR 02/27/2023 NEGATIVE  NEGATIVE Final   Comment: (NOTE) The Xpert Xpress SARS-CoV-2/FLU/RSV plus assay is intended as an aid in the diagnosis of influenza from Nasopharyngeal swab specimens and should not be used as a sole basis for treatment. Nasal washings and aspirates are unacceptable for Xpert Xpress SARS-CoV-2/FLU/RSV testing.  Fact Sheet for Patients: BloggerCourse.com  Fact Sheet for Healthcare Providers: SeriousBroker.it  This test is not yet approved or cleared by the [place]  FDA and has been authorized for detection and/or diagnosis of SARS-CoV-2 by FDA under an Emergency Use Authorization (EUA). This EUA will remain in effect  (meaning this test can be used) for the duration of the COVID-19 declaration under Section 564(b)(1) of the Act, 21 U.S.C. section 360bbb-3(b)(1), unless the authorization is terminated or revoked.     Resp Syncytial Virus by PCR 02/27/2023 NEGATIVE  NEGATIVE Final   Comment: (NOTE) Fact Sheet for Patients: BloggerCourse.com  Fact Sheet for Healthcare Providers: SeriousBroker.it  This test is not yet approved or cleared by the [place]  FDA and has been authorized for detection and/or diagnosis of SARS-CoV-2 by FDA under an Emergency Use Authorization (EUA). This EUA will remain in effect (meaning this test can be used) for the duration of the COVID-19 declaration under Section 564(b)(1) of the Act, 21 U.S.C. section 360bbb-3(b)(1), unless the authorization is terminated or revoked.  Performed at [facility], [address] [place], [place] [address]    Troponin I (High Sensitivity) 02/27/2023 5  <18 ng/L Final   Comment: (NOTE) Elevated high sensitivity troponin I (hsTnI) values and significant  changes across serial measurements may suggest ACS but many other  chronic and acute conditions are known to elevate hsTnI results.  Refer to the "Links" section for chest pain algorithms and additional  guidance. Performed at [facility], [address] [place], [place] [address]    Color, Urine 02/27/2023 YELLOW (A)  YELLOW Final   APPearance 02/27/2023 HAZY (A)  CLEAR Final   Specific Gravity, Urine 02/27/2023 1.021  1.005 - 1.030 Final   pH 02/27/2023 8.0  5.0 - 8.0 Final   Glucose, UA 02/27/2023 150 (A)  NEGATIVE mg/dL Final   Hgb urine dipstick 02/27/2023 NEGATIVE  NEGATIVE Final   Bilirubin Urine 02/27/2023 NEGATIVE  NEGATIVE Final   Ketones, ur 02/27/2023 NEGATIVE  NEGATIVE mg/dL Final   Protein, ur 24/40/1027 NEGATIVE  NEGATIVE mg/dL Final   Nitrite 25/36/6440 NEGATIVE  NEGATIVE Final    Leukocytes,Ua 02/27/2023 NEGATIVE  NEGATIVE Final   RBC / HPF 02/27/2023 0-5  0 - 5 RBC/hpf Final   WBC, UA 02/27/2023 0-5  0 - 5 WBC/hpf Final   Bacteria, UA 02/27/2023 NONE SEEN  NONE SEEN Final   Squamous Epithelial / HPF 02/27/2023 0-5  0 - 5 /HPF Final   Mucus 02/27/2023 PRESENT   Final   Performed at [facility], [address] [place], [place] [address]   Specimen source GC/Chlam 02/27/2023 URINE, RANDOM   Final   Chlamydia Tr 02/27/2023 NOT DETECTED  NOT DETECTED Final   N gonorrhoeae 02/27/2023 NOT DETECTED  NOT DETECTED Final   Comment: (NOTE) This CT/NG assay has not been evaluated in patients with a history of  hysterectomy. Performed at [facility], [address] [place], [place] [address]    Tricyclic, Ur Screen 02/27/2023 NONE DETECTED  NONE DETECTED Final   Amphetamines, Ur Screen 02/27/2023 NONE DETECTED  NONE DETECTED Final   MDMA (Ecstasy)Ur Screen 02/27/2023 NONE DETECTED  NONE DETECTED Final   Cocaine Metabolite,Ur [place] 02/27/2023 POSITIVE (A)  NONE DETECTED Final   Opiate, Ur Screen 02/27/2023 NONE DETECTED  NONE DETECTED Final   Phencyclidine (PCP) Ur S 02/27/2023 NONE DETECTED  NONE DETECTED Final   Cannabinoid 50 Ng, Ur [place] 02/27/2023 NONE DETECTED  NONE DETECTED Final   Barbiturates, Ur Screen 02/27/2023 NONE DETECTED  NONE DETECTED Final   Benzodiazepine, Ur Scrn 02/27/2023 POSITIVE (A)  NONE DETECTED Final   Methadone Scn, Ur 02/27/2023 NONE DETECTED  NONE DETECTED Final   Comment: (NOTE) Tricyclics + metabolites, urine    Cutoff 1000 ng/mL Amphetamines + metabolites, urine  Cutoff 1000 ng/mL MDMA (Ecstasy), urine              Cutoff 500 ng/mL Cocaine Metabolite, urine          Cutoff 300 ng/mL Opiate + metabolites, urine        Cutoff 300 ng/mL Phencyclidine (PCP), urine         Cutoff 25 ng/mL Cannabinoid, urine                 Cutoff 50 ng/mL Barbiturates + metabolites, urine  Cutoff 200 ng/mL Benzodiazepine, urine               Cutoff 200 ng/mL Methadone, urine                   Cutoff 300 ng/mL  The urine drug screen provides only a preliminary, unconfirmed analytical test result and should not be used for non-medical purposes. Clinical consideration and professional judgment should be applied to any positive drug screen result due to possible interfering substances. A more specific alternate chemical method must be used in order to obtain a confirmed analytical result. Gas chromatography / mass spectrometry (GC/MS) is the preferred confirm                          atory method. Performed at [facility], [address] [place], [place] [address]   Admission on 02/18/2023, Discharged on 02/19/2023  Component Date Value Ref Range Status   Sodium 02/19/2023 134 (L)  135 - 145 mmol/L Final   Potassium 02/19/2023 4.3  3.5 - 5.1 mmol/L Final   Chloride 02/19/2023 100  98 - 111 mmol/L Final   CO2 02/19/2023 24  22 - 32 mmol/L Final   Glucose, Bld 02/19/2023 213 (H)  70 - 99 mg/dL Final   Glucose reference range applies only to samples taken after fasting for at least 8 hours.   BUN 02/19/2023 15  6 - 20 mg/dL Final   Creatinine, Ser 02/19/2023 0.95  0.61 - 1.24 mg/dL Final   Calcium 56/21/3086 9.5  8.9 - 10.3 mg/dL Final   Total Protein 57/84/6962 6.6  6.5 - 8.1 g/dL Final   Albumin 95/28/4132 3.4 (L)  3.5 - 5.0 g/dL Final   AST 44/01/270 30  15 - 41 U/L Final   ALT 02/19/2023 19  0 - 44 U/L Final   Alkaline Phosphatase 02/19/2023 60  38 - 126 U/L Final   Total Bilirubin 02/19/2023 0.4  0.0 - 1.2 mg/dL Final   GFR, Estimated 02/19/2023 >60  >60  mL/min Final   Comment: (NOTE) Calculated using the CKD-EPI Creatinine Equation (2021)    Anion gap 02/19/2023 10  5 - 15 Final   Performed at [facility], [address] [place], [place] [address]   Alcohol , Ethyl (B) 02/19/2023 <10  <10 mg/dL Final   Comment: (NOTE) Lowest detectable limit for serum alcohol  is 10 mg/dL.  For medical purposes  only. Performed at [facility], [address] [place], [place] [address]    Salicylate Lvl 02/19/2023 <7.0 (L)  7.0 - 30.0 mg/dL Final   Performed at [facility], [address], [place], [place] [address]   Acetaminophen  (Tylenol ), Serum 02/19/2023 <10 (L)  10 - 30 ug/mL Final   Comment: (NOTE) Therapeutic concentrations vary significantly. A range of 10-30 ug/mL  may be an effective concentration for many patients. However, some  are best treated at concentrations outside of this range. Acetaminophen  concentrations >150 ug/mL at 4 hours after ingestion  and >50 ug/mL at 12 hours after ingestion are often associated with  toxic reactions.  Performed at [facility], [address], [place], [place] [address]    WBC 02/19/2023 4.5  4.0 - 10.5 K/uL Final   RBC 02/19/2023 4.73  4.22 - 5.81 MIL/uL Final   Hemoglobin 02/19/2023 13.8  13.0 - 17.0 g/dL Final   HCT 29/56/2130 41.8  39.0 - 52.0 % Final   MCV 02/19/2023 88.4  80.0 - 100.0 fL Final   MCH 02/19/2023 29.2  26.0 - 34.0 pg Final   MCHC 02/19/2023 33.0  30.0 - 36.0 g/dL Final   RDW 86/57/8469 13.6  11.5 - 15.5 % Final   Platelets 02/19/2023 373  150 - 400 K/uL Final   nRBC 02/19/2023 0.0  0.0 - 0.2 % Final   Performed at [facility], [address] [place], [place] [address]   Opiates 02/19/2023 NONE DETECTED  NONE DETECTED Final   Cocaine 02/19/2023 POSITIVE (A)  NONE DETECTED Final   Benzodiazepines 02/19/2023 NONE DETECTED  NONE DETECTED Final   Amphetamines 02/19/2023 NONE DETECTED  NONE DETECTED Final   Tetrahydrocannabinol 02/19/2023 NONE DETECTED  NONE DETECTED Final   Barbiturates 02/19/2023 NONE DETECTED  NONE DETECTED Final   Comment: (NOTE) DRUG SCREEN FOR MEDICAL PURPOSES ONLY.  IF CONFIRMATION IS NEEDED FOR ANY PURPOSE, NOTIFY LAB WITHIN 5 DAYS.  LOWEST DETECTABLE LIMITS FOR URINE DRUG SCREEN Drug Class                     Cutoff (ng/mL) Amphetamine and metabolites     1000 Barbiturate and metabolites    200 Benzodiazepine                 200 Opiates and metabolites        300 Cocaine and metabolites        300 THC                            50 Performed at [facility], [address], [place], [place] [address]   Admission on 02/18/2023, Discharged on 02/18/2023  Component Date Value Ref Range Status   Sodium 02/18/2023 137  135 - 145 mmol/L Final   Potassium 02/18/2023 4.1  3.5 - 5.1 mmol/L Final   Chloride 02/18/2023 97 (L)  98 - 111 mmol/L Final   CO2 02/18/2023 25  22 - 32 mmol/L Final   Glucose,  Bld 02/18/2023 125 (H)  70 - 99 mg/dL Final   Glucose reference range applies only to samples taken after fasting for at least 8 hours.   BUN 02/18/2023 11  6 - 20 mg/dL Final   Creatinine, Ser 02/18/2023 0.91  0.61 - 1.24 mg/dL Final   Calcium 16/10/9602 9.2  8.9 - 10.3 mg/dL Final   Total Protein 54/09/8117 6.9  6.5 - 8.1 g/dL Final   Albumin 14/78/2956 3.7  3.5 - 5.0 g/dL Final   AST 21/30/8657 36  15 - 41 U/L Final   ALT 02/18/2023 20  0 - 44 U/L Final   Alkaline Phosphatase 02/18/2023 52  38 - 126 U/L Final   Total Bilirubin 02/18/2023 0.7  0.0 - 1.2 mg/dL Final   GFR, Estimated 02/18/2023 >60  >60 mL/min Final   Comment: (NOTE) Calculated using the CKD-EPI Creatinine Equation (2021)    Anion gap 02/18/2023 15  5 - 15 Final   Performed at [facility], [address], [place], [place] [address]   Alcohol , Ethyl (B) 02/18/2023 16 (H)  <10 mg/dL Final   Comment: (NOTE) Lowest detectable limit for serum alcohol  is 10 mg/dL.  For medical purposes only. Performed at [facility], [address] [place], [place] [address]    WBC 02/18/2023 9.5  4.0 - 10.5 K/uL Final   RBC 02/18/2023 5.09  4.22 - 5.81 MIL/uL Final   Hemoglobin 02/18/2023 14.8  13.0 - 17.0 g/dL Final   HCT 41/32/4401 45.4  39.0 - 52.0 % Final   MCV 02/18/2023 89.2  80.0 - 100.0 fL Final   MCH 02/18/2023 29.1  26.0 - 34.0 pg Final   MCHC 02/18/2023 32.6   30.0 - 36.0 g/dL Final   RDW 02/72/5366 13.6  11.5 - 15.5 % Final   Platelets 02/18/2023 399  150 - 400 K/uL Final   nRBC 02/18/2023 0.0  0.0 - 0.2 % Final   Performed at [facility], [address] [place], [place] [address]   Opiates 02/18/2023 NONE DETECTED  NONE DETECTED Final   Cocaine 02/18/2023 POSITIVE (A)  NONE DETECTED Final   Benzodiazepines 02/18/2023 NONE DETECTED  NONE DETECTED Final   Amphetamines 02/18/2023 NONE DETECTED  NONE DETECTED Final   Tetrahydrocannabinol 02/18/2023 NONE DETECTED  NONE DETECTED Final   Barbiturates 02/18/2023 NONE DETECTED  NONE DETECTED Final   Comment: (NOTE) DRUG SCREEN FOR MEDICAL PURPOSES ONLY.  IF CONFIRMATION IS NEEDED FOR ANY PURPOSE, NOTIFY LAB WITHIN 5 DAYS.  LOWEST DETECTABLE LIMITS FOR URINE DRUG SCREEN Drug Class                     Cutoff (ng/mL) Amphetamine and metabolites    1000 Barbiturate and metabolites    200 Benzodiazepine                 200 Opiates and metabolites        300 Cocaine and metabolites        300 THC                            50 Performed at [facility], [address] [place], [place] [address]    SARS Coronavirus 2 by RT PCR 02/18/2023 NEGATIVE  NEGATIVE Final   Influenza A by PCR 02/18/2023 NEGATIVE  NEGATIVE Final   Influenza B by PCR 02/18/2023 NEGATIVE  NEGATIVE Final   Comment: (NOTE) The  Xpert Xpress SARS-CoV-2/FLU/RSV plus assay is intended as an aid in the diagnosis of influenza from Nasopharyngeal swab specimens and should not be used as a sole basis for treatment. Nasal washings and aspirates are unacceptable for Xpert Xpress SARS-CoV-2/FLU/RSV testing.  Fact Sheet for Patients: BloggerCourse.com  Fact Sheet for Healthcare Providers: SeriousBroker.it  This test is not yet approved or cleared by the [place]  FDA and has been authorized for detection and/or diagnosis of SARS-CoV-2 by FDA under an Emergency Use  Authorization (EUA). This EUA will remain in effect (meaning this test can be used) for the duration of the COVID-19 declaration under Section 564(b)(1) of the Act, 21 U.S.C. section 360bbb-3(b)(1), unless the authorization is terminated or revoked.     Resp Syncytial Virus by PCR 02/18/2023 NEGATIVE  NEGATIVE Final   Comment: (NOTE) Fact Sheet for Patients: BloggerCourse.com  Fact Sheet for Healthcare Providers: SeriousBroker.it  This test is not yet approved or cleared by the [place]  FDA and has been authorized for detection and/or diagnosis of SARS-CoV-2 by FDA under an Emergency Use Authorization (EUA). This EUA will remain in effect (meaning this test can be used) for the duration of the COVID-19 declaration under Section 564(b)(1) of the Act, 21 U.S.C. section 360bbb-3(b)(1), unless the authorization is terminated or revoked.  Performed at [facility], [address], [place], [place] [address]   There may be more visits with results that are not included.    Blood Alcohol  level:  Lab Results  Component Value Date   [facility] <15 06/07/2023   ETH <10 02/27/2023    Metabolic Disorder Labs: Lab Results  Component Value Date   HGBA1C 5.6 03/05/2023   MPG 114.02 03/05/2023   MPG 117 02/02/2009   No results found for: "PROLACTIN" Lab Results  Component Value Date   CHOL 184 06/11/2023   TRIG 98 06/11/2023   HDL 50 06/11/2023   CHOLHDL 3.7 06/11/2023   VLDL 20 06/11/2023   LDLCALC 114 (H) 06/11/2023   LDLCALC 105 (H) 03/05/2023    Therapeutic Lab Levels: No results found for: "LITHIUM" No results found for: "VALPROATE" No results found for: "CBMZ"  Physical Findings   AIMS    Flowsheet Row Admission (Discharged) from 02/02/2016 [facility] Admission (Discharged) from 03/22/2015 in [facility] Admission (Discharged) from 10/05/2014 in [facility] INPATIENT  BEHAVIORAL MEDICINE Admission (Discharged) [facility]  AIMS Total Score 0 0 0 0      AUDIT    Flowsheet Row Admission (Discharged) from 02/27/2023 in [facility] BEHAVIORAL MEDICINE Admission (Discharged) from 11/17/2022 in [facility] INPATIENT BEHAVIORAL MEDICINE Admission (Discharged) from 10/27/2022 in [facility] INPATIENT BEHAVIORAL MEDICINE Admission (Discharged) from 03/22/2015 in BE[facility]IORAL [facility]ALTH OBSERVATION UNIT Admission (Discharged) from 10/05/2014 in [facility] INPATIENT BEHAVIORAL MEDICINE  Alcohol  Use Disorder Identification Test Final Score (AUDIT) 22 14 14 25 18       PHQ2-9    Flowsheet Row ED from 06/08/2023 in [facility] ED from 12/03/2022 in [facility]  PHQ-2 Total Score 4 0  PHQ-9 Total Score 9 0      Flowsheet Row ED from 06/08/2023 in [facility] ED from 06/07/2023 in [facility] Emergency Department at [facility] ED to Hosp-Admission (Discharged) from 05/16/2023 in [facility] 1C MEDICAL TELEMETRY  C-SSRS RISK CATEGORY No Risk No Risk No Risk        Musculoskeletal  Strength &  Muscle Tone: within normal limits Gait & Station: normal Patient leans: N/A  Psychiatric Specialty Exam  Presentation  General Appearance:  Appropriate for Environment; Casual; Fairly Groomed  Eye Contact: Fair  Speech: Clear and Coherent; Normal Rate  Speech Volume: Normal  Handedness: Right   Mood and Affect  Mood: Anxious; Depressed; Irritable  Affect: Appropriate; Congruent; Restricted   Thought Process  Thought Processes: Coherent; Goal Directed; Linear  Descriptions of Associations:Intact  Orientation:Full (Time, Place and Person)  Thought Content:Rumination; Perseveration; WDL  Diagnosis of Schizophrenia or Schizoaffective disorder in past: No   Hallucinations:Hallucinations: None  Ideas of  Reference:None  Suicidal Thoughts:Suicidal Thoughts: No  Homicidal Thoughts:Homicidal Thoughts: No   Sensorium  Memory: Immediate Good  Judgment: Fair  Insight: Shallow   Executive Functions  Concentration: Good  Attention Span: Good  Recall: Good  Fund of Knowledge: Good  Language: Good   Psychomotor Activity  Psychomotor Activity:Psychomotor Activity: Normal   Assets  Assets: Communication Skills; Desire for Improvement; Resilience   Sleep  Sleep:Sleep: Poor   No data recorded  Physical Exam  Physical Exam Vitals and nursing note reviewed.  Constitutional:      General: He is not in acute distress.    Appearance: Normal appearance. He is not ill-appearing, toxic-appearing or diaphoretic.  HENT:     Head: Normocephalic and atraumatic.     Nose: No congestion.  Eyes:     Conjunctiva/sclera: Conjunctivae normal.  Pulmonary:     Effort: Pulmonary effort is normal. No respiratory distress.  Neurological:     General: No focal deficit present.     Mental Status: He is alert and oriented to person, place, and time.     Gait: Gait normal.    Review of Systems  Constitutional:  Positive for malaise/fatigue.  HENT:  Negative for congestion.   Respiratory:  Negative for cough, shortness of breath and wheezing.   Cardiovascular:  Negative for chest pain and palpitations.  Gastrointestinal:  Negative for abdominal pain, nausea and vomiting.  Neurological:  Negative for dizziness, tremors and headaches.   Blood pressure (!) 141/88, pulse (!) 50, temperature 98.5 F (36.9 C), temperature source Oral, resp. rate 17, SpO2 98%. There is no height or weight on file to calculate BMI.  Treatment Plan Summary: Daily contact with patient to assess and evaluate symptoms and progress in treatment and Medication management - Please see the [facility] for detailed medications. -Continue Abilify  10 mg daily for mood stabilization -cont Inderal  10 mg BID for  hypertension/GAD -Continue Prozac  20 mg daily for depressive symptoms -Continue hydroxyzine  25 mg 3 times daily as needed for breakthrough anxiety -Continue trazodone  50 mg nightly as needed for sleep -cont agitation protocol medication as needed Zyprexa  5 mg IM or p.o. for agitation - Oral thiamine  and MVI replacement - Abstinence from substances encouraged - SW to look into options for outpatient SA treatment at discharge  -started naltrexone 25 mggx1d then increase to 50 mg qPM  Labs reviewed: Ordered hemoglobin A1c as last 1 was on 03/1.  Ordered lipid panel and hemoglobin A1c.  Ordered vitamin D , and B12.  Demosthenes Virnig, DO 06/11/2023 3:04 PM

## 2023-06-11 NOTE — ED Notes (Signed)
 Patient is sleeping. Respirations equal and unlabored, skin warm and dry. No change in assessment or acuity. Routine safety checks conducted according to facility protocol.

## 2023-06-11 NOTE — ED Notes (Signed)
 Patient isolative to room and bed.  Minimally interactive.  Patient is calm on unit but guarded.  No withdrawal at this time.  He was encouraged to come to group outside after lunch but he declined.  No behavioral issues.  Keeps to himself.  Denies withdrawal.  Will monitor.

## 2023-06-11 NOTE — Group Note (Signed)
 Group Topic: Relaxation  Group Date: 06/11/2023 Start Time: 1215 End Time: 1250 Facilitators: Erma Hay, NT  Department: [facility]  Number of Participants: 7  Group Focus: relaxation Treatment Modality:  Patient-Centered Therapy Interventions utilized were group exercise Purpose: reinforce self-care  Name: Franklin [name] Date of Birth: [birth year]  MR: [id number]    Level of Participation: Patient did not attend group. Quality of Participation:  Interactions with others:  Mood/Affect:  Triggers (if applicable):  Cognition:  Progress:  Response:  Plan:   Patients Problems:  Patient Active Problem List   Diagnosis Date Noted   Hypokalemia 05/16/2023   A-fib 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

## 2023-06-11 NOTE — ED Notes (Signed)
 Patient resting quietly in bed with eyes closed with unlabored breathing. Q 15 minute safety checks remain in place.  Pt remains safe on the unit at this time.

## 2023-06-11 NOTE — ED Notes (Signed)
 Labs collected at 0520 and sent to [facility] lab via [place] courier.

## 2023-06-11 NOTE — Group Note (Signed)
 Group Topic: Fears and Unhealthy Coping Skills  Group Date: 06/11/2023 Start Time: 1030 End Time: 1100 Facilitators: Milan Alfred, NT MHT 2 Department: [facility]  Number of Participants: 1  Group Focus: coping skills Treatment Modality:  Behavior Modification Therapy Interventions utilized were patient education Purpose: explore maladaptive thinking  Name: Franklin [name] Date of Birth: 04-15-1970  MR: [id number]    Level of Participation: Patient did not attend group Quality of Participation: N/A Interactions with others: NA Mood/Affect: N/A Triggers (if applicable): N/A Cognition: N/A Progress: N/A Response: N/A Plan: N/A  Patients Problems:  Patient Active Problem List   Diagnosis Date Noted   Hypokalemia 05/16/2023   A-fib [facility])    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) 05/05/2023   Malingering 02/27/2023   MDD (major depressive disorder) 02/27/2023   Cocaine abuse with cocaine-induced mood disorder (HCC) 12/03/2022   Major depressive disorder, 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

## 2023-06-12 ENCOUNTER — Encounter (HOSPITAL_COMMUNITY): Payer: Self-pay | Admitting: Psychiatry

## 2023-06-12 DIAGNOSIS — Z5900 Homelessness unspecified: Secondary | ICD-10-CM | POA: Diagnosis not present

## 2023-06-12 DIAGNOSIS — F32A Depression, unspecified: Secondary | ICD-10-CM | POA: Diagnosis not present

## 2023-06-12 DIAGNOSIS — F17210 Nicotine dependence, cigarettes, uncomplicated: Secondary | ICD-10-CM | POA: Diagnosis not present

## 2023-06-12 DIAGNOSIS — E559 Vitamin D deficiency, unspecified: Secondary | ICD-10-CM

## 2023-06-12 DIAGNOSIS — F1424 Cocaine dependence with cocaine-induced mood disorder: Secondary | ICD-10-CM | POA: Diagnosis not present

## 2023-06-12 HISTORY — DX: Vitamin D deficiency, unspecified: E55.9

## 2023-06-12 MED ORDER — VITAMIN D 25 MCG (1000 UNIT) PO TABS
1000.0000 [IU] | ORAL_TABLET | Freq: Every day | ORAL | Status: DC
  Administered 2023-06-13 – 2023-06-14 (×2): 1000 [IU] via ORAL
  Filled 2023-06-12 (×2): qty 1

## 2023-06-12 NOTE — ED Notes (Signed)
 Pt compliant will all hs medications. Medications reviewed. Pt denies concerns or complaints.

## 2023-06-12 NOTE — ED Notes (Signed)
 Patient is sleeping. Respirations equal and unlabored, skin warm and dry. No change in assessment or acuity. Routine safety checks conducted according to facility protocol.

## 2023-06-12 NOTE — Group Note (Signed)
 Group Topic: Understanding Self  Group Date: 06/12/2023 Start Time: 1655 End Time: 1705 Facilitators: Pennie Box, RN  Department: [facility]  Number of Participants: 9  Group Focus: feeling awareness/expression Treatment Modality:  Psychoeducation Interventions utilized were assignment and patient education Purpose: increase insight  Name: Franklin [name] Date of Birth: 23-Aug-1970  MR: [id number]    Level of Participation: active Quality of Participation: attentive and cooperative Interactions with others: gave feedback Mood/Affect: appropriate Triggers (if applicable): n/a Cognition: coherent/clear Progress: Significant Response: pt verbal expressed the importance of knowing what makes him feel loved Plan: patient will be encouraged to attend future nursing groups  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) 05/05/2023   Malingering 02/27/2023   MDD (major depressive disorder) 02/27/2023   Cocaine abuse with cocaine-induced mood disorder (HCC) 12/03/2022   Major depressive disorder, 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

## 2023-06-12 NOTE — ED Provider Notes (Signed)
 Behavioral Health Progress Note  Date and Time: 06/12/2023 3:15 PM Name: Shalon Councilman MRN:  [id number]  HPI: Haydon Dorris is a 53 y.o. male with a history of AUD, cocaine use disorder, MDD, GAD who initially presented to the [facility] ER on 06/3 after being found acting erratically outside with a change in mental status. Pt was also very agitated, requiring Versed  to be subdued and transported to the ER for medical evaluation where he was subsequently recommended for inpatient treatment, and transferred to the [facility] for treatment and stabilization of his mental status on 06/04. Main stressor as reported on admission to the [facility] is homelessness and substance abuse.  Subjective: Initially seen asleep in bed, mid morning, pleasant, engaged with eval.   Feels "ok", some depressed and anxious mood, but not so bad, feels manageable. No issues with sleep, but still feels tired. Appetite is good.  Denied side effects to naltrexone. Denied somatic concerns.  Denied active and passive SI, HI, AVH, paranoia   Has an appointment to talk to [facility] monday AM (6/9)  Diagnosis:  Final diagnoses:  Cocaine use disorder, severe, dependence (HCC)  Substance induced mood disorder (HCC)  Tobacco use disorder  Chronic obstructive pulmonary disease, unspecified COPD type (HCC)  Generalized anxiety disorder  Alcohol  use disorder, severe, dependence (HCC)   Total Time spent with patient: 30 minutes  Sleep: see subjective  Appetite:  see subjective  Current Medications:  Current Facility-Administered Medications  Medication Dose Route Frequency Provider Last Rate Last Admin   ARIPiprazole  (ABILIFY ) tablet 10 mg  10 mg Oral Daily McLauchlin, Angela, NP   10 mg at 06/12/23 0915   [START ON 06/13/2023] cholecalciferol (VITAMIN D3) 25 MCG (1000 UNIT) tablet 1,000 Units  1,000 Units Oral Q breakfast Jozef Eisenbeis, DO       FLUoxetine  (PROZAC ) capsule 20 mg  20 mg Oral Daily Lissa Riding, NP   20 mg at 06/12/23 0915   fluticasone  furoate-vilanterol (BREO ELLIPTA ) 200-25 MCG/ACT 1 puff  1 puff Inhalation q AM Devontaye Ground, DO   1 puff at 06/12/23 4132   gabapentin  (NEURONTIN ) capsule 300 mg  300 mg Oral TID Lissa Riding, NP   300 mg at 06/12/23 4401   ibuprofen  (ADVIL ) tablet 600 mg  600 mg Oral Q6H PRN Lissa Riding, NP       multivitamin with minerals tablet 1 tablet  1 tablet Oral Daily McLauchlin, Angela, NP   1 tablet at 06/12/23 0917   naltrexone (DEPADE) tablet 50 mg  50 mg Oral QHS Malayasia Mirkin, DO       propranolol  (INDERAL ) tablet 10 mg  10 mg Oral BID Nkwenti, Doris, NP   10 mg at 06/12/23 0272   thiamine  (VITAMIN B1) tablet 100 mg  100 mg Oral Daily McLauchlin, Angela, NP   100 mg at 06/12/23 5366   traZODone  (DESYREL ) tablet 50 mg  50 mg Oral QHS PRN Lord, Jamison Y, NP   50 mg at 06/11/23 2134   Current Outpatient Medications  Medication Sig Dispense Refill   albuterol  (VENTOLIN  HFA) 108 (90 Base) MCG/ACT inhaler Inhale 2 puffs into the lungs every 6 (six) hours as needed for wheezing or shortness of breath. 8 g 0   FLUoxetine  (PROZAC ) 10 MG capsule Take 3 capsules (30 mg total) by mouth daily. 90 capsule 0   fluticasone  furoate-vilanterol (BREO ELLIPTA ) 100-25 MCG/ACT AEPB Inhale 1 puff into the lungs daily. 60 each 0   ipratropium-albuterol  (DUONEB)  0.5-2.5 (3) MG/3ML SOLN Take 3 mLs by nebulization every 6 (six) hours as needed. (Patient taking differently: Take 3 mLs by nebulization every 6 (six) hours as needed (For shortness of breath).) 360 mL 0   mometasone -formoterol  (DULERA ) 200-5 MCG/ACT AERO Inhale 2 puffs into the lungs 2 (two) times daily in the am and at bedtime..      Labs  Lab Results:  Admission on 06/08/2023  Component Date Value Ref Range Status   Vit D, 25-Hydroxy 06/11/2023 27.83 (L)  30 - 100 ng/mL Final   Comment: (NOTE) Vitamin D  deficiency has been defined by the Institute of Medicine  and an Endocrine Society practice  guideline as a level of serum 25-OH  vitamin D  less than 20 ng/mL (1,2). The Endocrine Society went on to  further define vitamin D  insufficiency as a level between 21 and 29  ng/mL (2).  1. IOM (Institute of Medicine). 2010. Dietary reference intakes for  calcium and D. [place]: [employer]. 2. Holick MF, Binkley Okeene, Bischoff-Ferrari HA, et al. Evaluation,  treatment, and prevention of vitamin D  deficiency: an Endocrine  Society clinical practice guideline, JCEM. 2011 Jul; 96(7): 1911-30.  Performed at [facility], [address], [place], [place] [address]    Vitamin B-12 06/11/2023 310  180 - 914 pg/mL Final   Comment: (NOTE) This assay is not validated for testing neonatal or myeloproliferative syndrome specimens for Vitamin B12 levels. Performed at [facility], [address] [place], [place] [address]    TSH 06/11/2023 0.355  0.350 - 4.500 uIU/mL Final   Comment: Performed by a 3rd Generation assay with a functional sensitivity of <=0.01 uIU/mL. Performed at [facility], [address], [place], [place] [address]    Cholesterol 06/11/2023 184  0 - 200 mg/dL Final   Triglycerides 91/47/8295 98  <150 mg/dL Final   HDL 62/13/0865 50  >40 mg/dL Final   Total CHOL/HDL Ratio 06/11/2023 3.7  RATIO Final   VLDL 06/11/2023 20  0 - 40 mg/dL Final   LDL Cholesterol 06/11/2023 114 (H)  0 - 99 mg/dL Final   Comment:        Total Cholesterol/HDL:CHD Risk Coronary Heart Disease Risk Table                     Men   Women  1/2 Average Risk   3.4   3.3  Average Risk       5.0   4.4  2 X Average Risk   9.6   7.1  3 X Average Risk  23.4   11.0        Use the calculated Patient Ratio above and the CHD Risk Table to determine the patient's CHD Risk.        ATP III CLASSIFICATION (LDL):  <100     mg/dL   Optimal  784-696  mg/dL   Near or Above                    Optimal  130-159  mg/dL   Borderline  295-284  mg/dL   High  >132     mg/dL    Very High Performed at [facility], [address], [place], [place] [address]   Admission on 06/07/2023, Discharged on 06/08/2023  Component Date Value Ref Range Status   WBC 06/07/2023 6.9  4.0 - 10.5 K/uL Final   RBC 06/07/2023 4.11 (L)  4.22 - 5.81 MIL/uL Final   Hemoglobin 06/07/2023 11.8 (L)  13.0 - 17.0 g/dL Final   HCT 40/10/2723 35.9 (L)  39.0 - 52.0 % Final   MCV 06/07/2023 87.3  80.0 - 100.0 fL Final   MCH 06/07/2023 28.7  26.0 - 34.0 pg Final   MCHC 06/07/2023 32.9  30.0 - 36.0 g/dL Final   RDW 36/64/4034 15.1  11.5 - 15.5 % Final   Platelets 06/07/2023 249  150 - 400 K/uL Final   nRBC 06/07/2023 0.0  0.0 - 0.2 % Final   Neutrophils Relative % 06/07/2023 66  % Final   Neutro Abs 06/07/2023 4.6  1.7 - 7.7 K/uL Final   Lymphocytes Relative 06/07/2023 22  % Final   Lymphs Abs 06/07/2023 1.5  0.7 - 4.0 K/uL Final   Monocytes Relative 06/07/2023 9  % Final   Monocytes Absolute 06/07/2023 0.6  0.1 - 1.0 K/uL Final   Eosinophils Relative 06/07/2023 2  % Final   Eosinophils Absolute 06/07/2023 0.1  0.0 - 0.5 K/uL Final   Basophils Relative 06/07/2023 1  % Final   Basophils Absolute 06/07/2023 0.0  0.0 - 0.1 K/uL Final   Immature Granulocytes 06/07/2023 0  % Final   Abs Immature Granulocytes 06/07/2023 0.03  0.00 - 0.07 K/uL Final   Performed at [facility], [address] [place], [place] [address]   Sodium 06/07/2023 138  135 - 145 mmol/L Final   Potassium 06/07/2023 3.7  3.5 - 5.1 mmol/L Final   Chloride 06/07/2023 104  98 - 111 mmol/L Final   CO2 06/07/2023 24  22 - 32 mmol/L Final   Glucose, Bld 06/07/2023 154 (H)  70 - 99 mg/dL Final   Glucose reference range applies only to samples taken after fasting for at least 8 hours.   BUN 06/07/2023 14  6 - 20 mg/dL Final   Creatinine, Ser 06/07/2023 0.88  0.61 - 1.24 mg/dL Final   Calcium 56/38/7564 9.3  8.9 - 10.3 mg/dL Final   Total Protein 33/29/5188 6.2 (L)  6.5 - 8.1 g/dL Final   Albumin 41/66/0630 3.7   3.5 - 5.0 g/dL Final   AST 16/01/930 29  15 - 41 U/L Final   ALT 06/07/2023 20  0 - 44 U/L Final   Alkaline Phosphatase 06/07/2023 62  38 - 126 U/L Final   Total Bilirubin 06/07/2023 2.0 (H)  0.0 - 1.2 mg/dL Final   GFR, Estimated 06/07/2023 >60  >60 mL/min Final   Comment: (NOTE) Calculated using the CKD-EPI Creatinine Equation (2021)    Anion gap 06/07/2023 10  5 - 15 Final   Performed at [facility], [address] [place], [place] [address]   Salicylate Lvl 06/07/2023 <7.0 (L)  7.0 - 30.0 mg/dL Final   Performed at [facility], [address] [place], [place] [address]   Acetaminophen  (Tylenol ), Serum 06/07/2023 <10 (L)  10 - 30 ug/mL Final   Comment: (NOTE) Therapeutic concentrations vary significantly. A range of 10-30 ug/mL  may be an effective concentration for many patients. However, some  are best treated at concentrations outside of this range. Acetaminophen  concentrations >150 ug/mL at 4 hours after ingestion  and >50 ug/mL at 12 hours after ingestion are often associated with  toxic reactions.  Performed at [facility], [address] [place], [place] [address]    Alcohol , Ethyl (B) 06/07/2023 <15  <15 mg/dL Final   Comment: (NOTE) For medical purposes only. Performed at [facility], [address] [place], [place] [address]    Tricyclic, Ur Screen 06/08/2023 NONE DETECTED  NONE DETECTED Final   Amphetamines, Ur Screen 06/08/2023 NONE DETECTED  NONE DETECTED Final   MDMA (Ecstasy)Ur Screen 06/08/2023 NONE DETECTED  NONE DETECTED Final   Cocaine Metabolite,Ur [place] 06/08/2023 POSITIVE (A)  NONE DETECTED Final   Opiate, Ur Screen 06/08/2023 NONE DETECTED  NONE DETECTED Final   Phencyclidine (PCP) Ur S 06/08/2023 NONE DETECTED  NONE DETECTED Final   Cannabinoid 50 Ng, Ur [place] 06/08/2023 NONE DETECTED  NONE DETECTED Final   Barbiturates, Ur Screen 06/08/2023 NONE DETECTED  NONE DETECTED Final   Benzodiazepine, Ur Scrn  06/08/2023 POSITIVE (A)  NONE DETECTED Final   Methadone Scn, Ur 06/08/2023 NONE DETECTED  NONE DETECTED Final   Comment: (NOTE) Tricyclics + metabolites, urine    Cutoff 1000 ng/mL Amphetamines + metabolites, urine  Cutoff 1000 ng/mL MDMA (Ecstasy), urine              Cutoff 500 ng/mL Cocaine Metabolite, urine          Cutoff 300 ng/mL Opiate + metabolites, urine        Cutoff 300 ng/mL Phencyclidine (PCP), urine         Cutoff 25 ng/mL Cannabinoid, urine                 Cutoff 50 ng/mL Barbiturates + metabolites, urine  Cutoff 200 ng/mL Benzodiazepine, urine              Cutoff 200 ng/mL Methadone, urine                   Cutoff 300 ng/mL  The urine drug screen provides only a preliminary, unconfirmed analytical test result and should not be used for non-medical purposes. Clinical consideration and professional judgment should be applied to any positive drug screen result due to possible interfering substances. A more specific alternate chemical method must be used in order to obtain a confirmed analytical result. Gas chromatography / mass spectrometry (GC/MS) is the preferred confirm                          atory method. Performed at [facility], [address] [place], [place] [address]   Admission on 05/16/2023, Discharged on 05/17/2023  Component Date Value Ref Range Status   WBC 05/16/2023 8.2  4.0 - 10.5 K/uL Final   RBC 05/16/2023 4.86  4.22 - 5.81 MIL/uL Final   Hemoglobin 05/16/2023 14.3  13.0 - 17.0 g/dL Final   HCT 78/29/5621 43.8  39.0 - 52.0 % Final   MCV 05/16/2023 90.1  80.0 - 100.0 fL Final   MCH 05/16/2023 29.4  26.0 - 34.0 pg Final   MCHC 05/16/2023 32.6  30.0 - 36.0 g/dL Final   RDW 30/86/5784 15.2  11.5 - 15.5 % Final   Platelets 05/16/2023 348  150 - 400 K/uL Final   nRBC 05/16/2023 0.0  0.0 - 0.2 % Final   Neutrophils Relative % 05/16/2023 61  % Final   Neutro Abs 05/16/2023 5.2  1.7 - 7.7 K/uL Final   Lymphocytes Relative 05/16/2023 30  %  Final   Lymphs Abs 05/16/2023 2.4  0.7 - 4.0 K/uL Final   Monocytes Relative 05/16/2023 5  % Final   Monocytes Absolute 05/16/2023 0.4  0.1 - 1.0 K/uL Final   Eosinophils Relative 05/16/2023 3  % Final   Eosinophils Absolute 05/16/2023 0.2  0.0 - 0.5 K/uL Final   Basophils Relative 05/16/2023 1  % Final   Basophils Absolute 05/16/2023 0.1  0.0 - 0.1 K/uL Final   Immature Granulocytes 05/16/2023 0  % Final   Abs Immature Granulocytes 05/16/2023 0.02  0.00 - 0.07 K/uL Final   Performed at [facility], [address] [place], [place] [address]   Sodium 05/16/2023 139  135 - 145 mmol/L Final   Potassium 05/16/2023 3.2 (L)  3.5 - 5.1 mmol/L Final   Chloride 05/16/2023 102  98 - 111 mmol/L Final   CO2 05/16/2023 24  22 - 32 mmol/L Final   Glucose, Bld 05/16/2023 125 (H)  70 - 99 mg/dL Final   Glucose reference range applies only to samples taken after fasting for at least 8 hours.   BUN 05/16/2023 7  6 - 20 mg/dL Final   Creatinine, Ser 05/16/2023 1.08  0.61 - 1.24 mg/dL Final   Calcium 42/59/5638 9.1  8.9 - 10.3 mg/dL Final   GFR, Estimated 05/16/2023 >60  >60 mL/min Final   Comment: (NOTE) Calculated using the CKD-EPI Creatinine Equation (2021)    Anion gap 05/16/2023 13  5 - 15 Final   Performed at [facility], [address] [place], [place] [address]   Troponin I (High Sensitivity) 05/16/2023 6  <18 ng/L Final   Comment: (NOTE) Elevated high sensitivity troponin I (hsTnI) values and significant  changes across serial measurements may suggest ACS but many other  chronic and acute conditions are known to elevate hsTnI results.  Refer to the "Links" section for chest pain algorithms and additional  guidance. Performed at [facility], [address] [place], [place] [address]    SARS Coronavirus 2 by RT PCR 05/16/2023 NEGATIVE  NEGATIVE Final   Comment: (NOTE) SARS-CoV-2 target nucleic acids are NOT DETECTED.  The SARS-CoV-2 RNA is generally  detectable in upper respiratory specimens during the acute phase of infection. The lowest concentration of SARS-CoV-2 viral copies this assay can detect is 138 copies/mL. A negative result does not preclude SARS-Cov-2 infection and should not be used as the sole basis for treatment or other patient management decisions. A negative result may occur with  improper specimen collection/handling, submission of specimen other than nasopharyngeal swab, presence of viral mutation(s) within the areas targeted by this assay, and inadequate number of viral copies(<138 copies/mL). A negative result must be combined with clinical observations, patient history, and epidemiological information. The expected result is Negative.  Fact Sheet for Patients:  BloggerCourse.com  Fact Sheet for Healthcare Providers:  SeriousBroker.it  This test is no                          t yet approved or cleared by the [place]  FDA and  has been authorized for detection and/or diagnosis of SARS-CoV-2 by FDA under an Emergency Use Authorization (EUA). This EUA will remain  in effect (meaning this test can be used) for the duration of the COVID-19 declaration under Section 564(b)(1) of the Act, 21 U.S.C.section 360bbb-3(b)(1), unless the authorization is terminated  or revoked sooner.       Influenza A by PCR 05/16/2023 NEGATIVE  NEGATIVE Final   Influenza B by PCR 05/16/2023 NEGATIVE  NEGATIVE Final   Comment: (NOTE) The Xpert Xpress SARS-CoV-2/FLU/RSV plus assay is intended as an aid in the diagnosis of influenza from Nasopharyngeal swab specimens and should not be used as a sole basis for treatment. Nasal  washings and aspirates are unacceptable for Xpert Xpress SARS-CoV-2/FLU/RSV testing.  Fact Sheet for Patients: BloggerCourse.com  Fact Sheet for Healthcare Providers: SeriousBroker.it  This test is not  yet approved or cleared by the [place]  FDA and has been authorized for detection and/or diagnosis of SARS-CoV-2 by FDA under an Emergency Use Authorization (EUA). This EUA will remain in effect (meaning this test can be used) for the duration of the COVID-19 declaration under Section 564(b)(1) of the Act, 21 U.S.C. section 360bbb-3(b)(1), unless the authorization is terminated or revoked.     Resp Syncytial Virus by PCR 05/16/2023 NEGATIVE  NEGATIVE Final   Comment: (NOTE) Fact Sheet for Patients: BloggerCourse.com  Fact Sheet for Healthcare Providers: SeriousBroker.it  This test is not yet approved or cleared by the [place]  FDA and has been authorized for detection and/or diagnosis of SARS-CoV-2 by FDA under an Emergency Use Authorization (EUA). This EUA will remain in effect (meaning this test can be used) for the duration of the COVID-19 declaration under Section 564(b)(1) of the Act, 21 U.S.C. section 360bbb-3(b)(1), unless the authorization is terminated or revoked.  Performed at [facility], [address] [place], [place] [address]    pH, Ven 05/16/2023 7.38  7.25 - 7.43 Final   pCO2, Ven 05/16/2023 44  44 - 60 mmHg Final   pO2, Ven 05/16/2023 96 (H)  32 - 45 mmHg Final   Bicarbonate 05/16/2023 26.0  20.0 - 28.0 mmol/L Final   Acid-Base Excess 05/16/2023 0.5  0.0 - 2.0 mmol/L Final   O2 Saturation 05/16/2023 98.7  % Final   Patient temperature 05/16/2023 37.0   Final   Collection site 05/16/2023 VEIN   Final   Performed at [facility], [address] [place], [place] [address]   Troponin I (High Sensitivity) 05/16/2023 5  <18 ng/L Final   Comment: (NOTE) Elevated high sensitivity troponin I (hsTnI) values and significant  changes across serial measurements may suggest ACS but many other  chronic and acute conditions are known to elevate hsTnI results.  Refer to the "Links" section for  chest pain algorithms and additional  guidance. Performed at [facility], [address] [place], [place] [address]    Tricyclic, Ur Screen 05/16/2023 NONE DETECTED  NONE DETECTED Final   Amphetamines, Ur Screen 05/16/2023 POSITIVE (A)  NONE DETECTED Final   Comment: (NOTE) Trazodone  is metabolized in vivo to several metabolites, including pharmacologically active m-CPP, which is excreted in the urine. Immunoassay screens for amphetamines and MDMA have potential cross-reactivity with these compounds and may provide false positive  results.     MDMA (Ecstasy)Ur Screen 05/16/2023 NONE DETECTED  NONE DETECTED Final   Cocaine Metabolite,Ur [place] 05/16/2023 POSITIVE (A)  NONE DETECTED Final   Opiate, Ur Screen 05/16/2023 NONE DETECTED  NONE DETECTED Final   Phencyclidine (PCP) Ur S 05/16/2023 NONE DETECTED  NONE DETECTED Final   Cannabinoid 50 Ng, Ur  05/16/2023 NONE DETECTED  NONE DETECTED Final   Barbiturates, Ur Screen 05/16/2023 NONE DETECTED  NONE DETECTED Final   Benzodiazepine, Ur Scrn 05/16/2023 NONE DETECTED  NONE DETECTED Final   Methadone Scn, Ur 05/16/2023 NONE DETECTED  NONE DETECTED Final   Comment: (NOTE) Tricyclics + metabolites, urine    Cutoff 1000 ng/mL Amphetamines + metabolites, urine  Cutoff 1000 ng/mL MDMA (Ecstasy), urine              Cutoff 500 ng/mL Cocaine Metabolite, urine          Cutoff 300  ng/mL Opiate + metabolites, urine        Cutoff 300 ng/mL Phencyclidine (PCP), urine         Cutoff 25 ng/mL Cannabinoid, urine                 Cutoff 50 ng/mL Barbiturates + metabolites, urine  Cutoff 200 ng/mL Benzodiazepine, urine              Cutoff 200 ng/mL Methadone, urine                   Cutoff 300 ng/mL  The urine drug screen provides only a preliminary, unconfirmed analytical test result and should not be used for non-medical purposes. Clinical consideration and professional judgment should be applied to any positive drug screen result due to  possible interfering substances. A more specific alternate chemical method must be used in order to obtain a confirmed analytical result. Gas chromatography / mass spectrometry (GC/MS) is the preferred confirm                          atory method. Performed at [facility], [address] [place], [place] [address]    Sodium 05/17/2023 136  135 - 145 mmol/L Final   Potassium 05/17/2023 3.7  3.5 - 5.1 mmol/L Final   Chloride 05/17/2023 104  98 - 111 mmol/L Final   CO2 05/17/2023 25  22 - 32 mmol/L Final   Glucose, Bld 05/17/2023 124 (H)  70 - 99 mg/dL Final   Glucose reference range applies only to samples taken after fasting for at least 8 hours.   BUN 05/17/2023 12  6 - 20 mg/dL Final   Creatinine, Ser 05/17/2023 0.92  0.61 - 1.24 mg/dL Final   Calcium 29/56/2130 8.6 (L)  8.9 - 10.3 mg/dL Final   GFR, Estimated 05/17/2023 >60  >60 mL/min Final   Comment: (NOTE) Calculated using the CKD-EPI Creatinine Equation (2021)    Anion gap 05/17/2023 7  5 - 15 Final   Performed at [facility], [address] [place], [place] [address]   Magnesium  05/17/2023 2.1  1.7 - 2.4 mg/dL Final   Performed at [facility], [address] [place], [place] [address]   WBC 05/17/2023 10.4  4.0 - 10.5 K/uL Final   RBC 05/17/2023 4.30  4.22 - 5.81 MIL/uL Final   Hemoglobin 05/17/2023 12.6 (L)  13.0 - 17.0 g/dL Final   HCT 95/28/4132 38.5 (L)  39.0 - 52.0 % Final   MCV 05/17/2023 89.5  80.0 - 100.0 fL Final   MCH 05/17/2023 29.3  26.0 - 34.0 pg Final   MCHC 05/17/2023 32.7  30.0 - 36.0 g/dL Final   RDW 44/01/270 15.3  11.5 - 15.5 % Final   Platelets 05/17/2023 311  150 - 400 K/uL Final   nRBC 05/17/2023 0.0  0.0 - 0.2 % Final   Performed at [facility], [address] [place], [place] [address]  Admission on 05/09/2023, Discharged on 05/09/2023  Component Date Value Ref Range Status   SARS Coronavirus 2 by RT PCR 05/09/2023 NEGATIVE  NEGATIVE Final    Influenza A by PCR 05/09/2023 NEGATIVE  NEGATIVE Final   Influenza B by PCR 05/09/2023 NEGATIVE  NEGATIVE Final   Comment: (NOTE) The Xpert Xpress SARS-CoV-2/FLU/RSV plus assay is intended as an aid in the diagnosis of influenza from Nasopharyngeal swab specimens and should not be used as a sole basis for treatment. Nasal  washings and aspirates are unacceptable for Xpert Xpress SARS-CoV-2/FLU/RSV testing.  Fact Sheet for Patients: BloggerCourse.com  Fact Sheet for Healthcare Providers: SeriousBroker.it  This test is not yet approved or cleared by the [place]  FDA and has been authorized for detection and/or diagnosis of SARS-CoV-2 by FDA under an Emergency Use Authorization (EUA). This EUA will remain in effect (meaning this test can be used) for the duration of the COVID-19 declaration under Section 564(b)(1) of the Act, 21 U.S.C. section 360bbb-3(b)(1), unless the authorization is terminated or revoked.     Resp Syncytial Virus by PCR 05/09/2023 NEGATIVE  NEGATIVE Final   Comment: (NOTE) Fact Sheet for Patients: BloggerCourse.com  Fact Sheet for Healthcare Providers: SeriousBroker.it  This test is not yet approved or cleared by the [place]  FDA and has been authorized for detection and/or diagnosis of SARS-CoV-2 by FDA under an Emergency Use Authorization (EUA). This EUA will remain in effect (meaning this test can be used) for the duration of the COVID-19 declaration under Section 564(b)(1) of the Act, 21 U.S.C. section 360bbb-3(b)(1), unless the authorization is terminated or revoked.  Performed at [facility], [address], [place], [place] [address]    Sodium 05/09/2023 139  135 - 145 mmol/L Final   Potassium 05/09/2023 4.1  3.5 - 5.1 mmol/L Final   Chloride 05/09/2023 102  98 - 111 mmol/L Final   CO2 05/09/2023 26  22 - 32 mmol/L Final   Glucose,  Bld 05/09/2023 191 (H)  70 - 99 mg/dL Final   Glucose reference range applies only to samples taken after fasting for at least 8 hours.   BUN 05/09/2023 15  6 - 20 mg/dL Final   Creatinine, Ser 05/09/2023 1.08  0.61 - 1.24 mg/dL Final   Calcium 02/72/5366 9.1  8.9 - 10.3 mg/dL Final   Total Protein 44/03/4740 6.7  6.5 - 8.1 g/dL Final   Albumin 59/56/3875 3.8  3.5 - 5.0 g/dL Final   AST 64/33/2951 30  15 - 41 U/L Final   ALT 05/09/2023 24  0 - 44 U/L Final   Alkaline Phosphatase 05/09/2023 57  38 - 126 U/L Final   Total Bilirubin 05/09/2023 1.1  0.0 - 1.2 mg/dL Final   GFR, Estimated 05/09/2023 >60  >60 mL/min Final   Comment: (NOTE) Calculated using the CKD-EPI Creatinine Equation (2021)    Anion gap 05/09/2023 11  5 - 15 Final   Performed at [facility], [address], [place], [place] [address]   WBC 05/09/2023 10.4  4.0 - 10.5 K/uL Final   RBC 05/09/2023 5.36  4.22 - 5.81 MIL/uL Final   Hemoglobin 05/09/2023 15.5  13.0 - 17.0 g/dL Final   HCT 60/63/0160 47.1  39.0 - 52.0 % Final   MCV 05/09/2023 87.9  80.0 - 100.0 fL Final   MCH 05/09/2023 28.9  26.0 - 34.0 pg Final   MCHC 05/09/2023 32.9  30.0 - 36.0 g/dL Final   RDW 10/93/2355 14.9  11.5 - 15.5 % Final   Platelets 05/09/2023 369  150 - 400 K/uL Final   nRBC 05/09/2023 0.0  0.0 - 0.2 % Final   Performed at [facility], [address], [place], [place] [address]   pH, Ven 05/09/2023 7.4  7.25 - 7.43 Final   pCO2, Ven 05/09/2023 47  44 - 60 mmHg Final   pO2, Ven 05/09/2023 89 (H)  32 - 45 mmHg Final   Bicarbonate 05/09/2023 29.1 (H)  20.0 -  28.0 mmol/L Final   Acid-Base Excess 05/09/2023 3.5 (H)  0.0 - 2.0 mmol/L Final   O2 Saturation 05/09/2023 98.8  % Final   Patient temperature 05/09/2023 37.0   Final   Collection site 05/09/2023 LEFT ANTECUBITAL   Final   IV   Drawn by 05/09/2023 DRAWN BY RN   Final   Performed at [facility], [address], [place], [place] [address]  Admission on 05/04/2023,  Discharged on 05/07/2023  Component Date Value Ref Range Status   WBC 05/04/2023 10.0  4.0 - 10.5 K/uL Final   RBC 05/04/2023 5.15  4.22 - 5.81 MIL/uL Final   Hemoglobin 05/04/2023 14.9  13.0 - 17.0 g/dL Final   HCT 88/41/6606 46.2  39.0 - 52.0 % Final   MCV 05/04/2023 89.7  80.0 - 100.0 fL Final   MCH 05/04/2023 28.9  26.0 - 34.0 pg Final   MCHC 05/04/2023 32.3  30.0 - 36.0 g/dL Final   RDW 30/16/0109 14.8  11.5 - 15.5 % Final   Platelets 05/04/2023 342  150 - 400 K/uL Final   nRBC 05/04/2023 0.0  0.0 - 0.2 % Final   Neutrophils Relative % 05/04/2023 58  % Final   Neutro Abs 05/04/2023 5.8  1.7 - 7.7 K/uL Final   Lymphocytes Relative 05/04/2023 29  % Final   Lymphs Abs 05/04/2023 2.9  0.7 - 4.0 K/uL Final   Monocytes Relative 05/04/2023 5  % Final   Monocytes Absolute 05/04/2023 0.5  0.1 - 1.0 K/uL Final   Eosinophils Relative 05/04/2023 7  % Final   Eosinophils Absolute 05/04/2023 0.7 (H)  0.0 - 0.5 K/uL Final   Basophils Relative 05/04/2023 1  % Final   Basophils Absolute 05/04/2023 0.1  0.0 - 0.1 K/uL Final   Immature Granulocytes 05/04/2023 0  % Final   Abs Immature Granulocytes 05/04/2023 0.03  0.00 - 0.07 K/uL Final   Performed at [facility], [address] [place], [place] [address]   Sodium 05/04/2023 139  135 - 145 mmol/L Final   Potassium 05/04/2023 3.8  3.5 - 5.1 mmol/L Final   Chloride 05/04/2023 106  98 - 111 mmol/L Final   CO2 05/04/2023 25  22 - 32 mmol/L Final   Glucose, Bld 05/04/2023 138 (H)  70 - 99 mg/dL Final   Glucose reference range applies only to samples taken after fasting for at least 8 hours.   BUN 05/04/2023 10  6 - 20 mg/dL Final   Creatinine, Ser 05/04/2023 0.90  0.61 - 1.24 mg/dL Final   Calcium 73/22/0254 9.0  8.9 - 10.3 mg/dL Final   GFR, Estimated 05/04/2023 >60  >60 mL/min Final   Comment: (NOTE) Calculated using the CKD-EPI Creatinine Equation (2021)    Anion gap 05/04/2023 8  5 - 15 Final   Performed at [facility],  [address], [place], [place] [address]   pH, Ven 05/04/2023 7.32  7.25 - 7.43 Final   pCO2, Ven 05/04/2023 60  44 - 60 mmHg Final   pO2, Ven 05/04/2023 42  32 - 45 mmHg Final   Bicarbonate 05/04/2023 30.7 (H)  20.0 - 28.0 mmol/L Final   Acid-Base Excess 05/04/2023 2.8 (H)  0.0 - 2.0 mmol/L Final   O2 Saturation 05/04/2023 72.7  % Final   Patient temperature 05/04/2023 36.5   Final   Performed at [facility], [address], [place], [place] [address]   SARS Coronavirus 2 by RT PCR  05/04/2023 NEGATIVE  NEGATIVE Final   Influenza A by PCR 05/04/2023 NEGATIVE  NEGATIVE Final   Influenza B by PCR 05/04/2023 NEGATIVE  NEGATIVE Final   Comment: (NOTE) The Xpert Xpress SARS-CoV-2/FLU/RSV plus assay is intended as an aid in the diagnosis of influenza from Nasopharyngeal swab specimens and should not be used as a sole basis for treatment. Nasal washings and aspirates are unacceptable for Xpert Xpress SARS-CoV-2/FLU/RSV testing.  Fact Sheet for Patients: BloggerCourse.com  Fact Sheet for Healthcare Providers: SeriousBroker.it  This test is not yet approved or cleared by the [place]  FDA and has been authorized for detection and/or diagnosis of SARS-CoV-2 by FDA under an Emergency Use Authorization (EUA). This EUA will remain in effect (meaning this test can be used) for the duration of the COVID-19 declaration under Section 564(b)(1) of the Act, 21 U.S.C. section 360bbb-3(b)(1), unless the authorization is terminated or revoked.     Resp Syncytial Virus by PCR 05/04/2023 NEGATIVE  NEGATIVE Final   Comment: (NOTE) Fact Sheet for Patients: BloggerCourse.com  Fact Sheet for Healthcare Providers: SeriousBroker.it  This test is not yet approved or cleared by the [place]  FDA and has been authorized for detection and/or diagnosis of SARS-CoV-2 by FDA under an Emergency Use  Authorization (EUA). This EUA will remain in effect (meaning this test can be used) for the duration of the COVID-19 declaration under Section 564(b)(1) of the Act, 21 U.S.C. section 360bbb-3(b)(1), unless the authorization is terminated or revoked.  Performed at [facility], [address] [place], [place] [address]    Neisseria Gonorrhea 05/05/2023 Negative   Final   Chlamydia 05/05/2023 Negative   Final   Comment 05/05/2023 Normal Reference Ranger Chlamydia - Negative   Final   Comment 05/05/2023 Normal Reference Range Neisseria Gonorrhea - Negative   Final   HIV Screen 4th Generation wRfx 05/05/2023 Non Reactive  Non Reactive Final   Performed at [facility], [address] [place], [place] [address]   Sodium 05/06/2023 139  135 - 145 mmol/L Final   Potassium 05/06/2023 3.8  3.5 - 5.1 mmol/L Final   Chloride 05/06/2023 107  98 - 111 mmol/L Final   CO2 05/06/2023 23  22 - 32 mmol/L Final   Glucose, Bld 05/06/2023 169 (H)  70 - 99 mg/dL Final   Glucose reference range applies only to samples taken after fasting for at least 8 hours.   BUN 05/06/2023 10  6 - 20 mg/dL Final   Creatinine, Ser 05/06/2023 0.90  0.61 - 1.24 mg/dL Final   Calcium 82/95/6213 9.0  8.9 - 10.3 mg/dL Final   GFR, Estimated 05/06/2023 >60  >60 mL/min Final   Comment: (NOTE) Calculated using the CKD-EPI Creatinine Equation (2021)    Anion gap 05/06/2023 9  5 - 15 Final   Performed at [facility], [address] [place], [place] [address]   WBC 05/06/2023 15.4 (H)  4.0 - 10.5 K/uL Final   RBC 05/06/2023 4.59  4.22 - 5.81 MIL/uL Final   Hemoglobin 05/06/2023 13.2  13.0 - 17.0 g/dL Final   HCT 84/69/6295 40.3  39.0 - 52.0 % Final   MCV 05/06/2023 87.8  80.0 - 100.0 fL Final   MCH 05/06/2023 28.8  26.0 - 34.0 pg Final   MCHC 05/06/2023 32.8  30.0 - 36.0 g/dL Final   RDW 28/41/3244 15.1  11.5 - 15.5 % Final   Platelets 05/06/2023 317  150 - 400 K/uL Final   nRBC  05/06/2023 0.0  0.0 - 0.2  % Final   Performed at [facility], [address] [place], [place] [address]  Admission on 04/29/2023, Discharged on 04/29/2023  Component Date Value Ref Range Status   Sodium 04/29/2023 138  135 - 145 mmol/L Final   Potassium 04/29/2023 3.8  3.5 - 5.1 mmol/L Final   Chloride 04/29/2023 104  98 - 111 mmol/L Final   CO2 04/29/2023 22  22 - 32 mmol/L Final   Glucose, Bld 04/29/2023 101 (H)  70 - 99 mg/dL Final   Glucose reference range applies only to samples taken after fasting for at least 8 hours.   BUN 04/29/2023 20  6 - 20 mg/dL Final   Creatinine, Ser 04/29/2023 1.11  0.61 - 1.24 mg/dL Final   Calcium 60/45/4098 9.5  8.9 - 10.3 mg/dL Final   Total Protein 11/91/4782 8.1  6.5 - 8.1 g/dL Final   Albumin 95/62/1308 4.4  3.5 - 5.0 g/dL Final   AST 65/78/4696 25  15 - 41 U/L Final   ALT 04/29/2023 19  0 - 44 U/L Final   Alkaline Phosphatase 04/29/2023 64  38 - 126 U/L Final   Total Bilirubin 04/29/2023 2.0 (H)  0.0 - 1.2 mg/dL Final   GFR, Estimated 04/29/2023 >60  >60 mL/min Final   Comment: (NOTE) Calculated using the CKD-EPI Creatinine Equation (2021)    Anion gap 04/29/2023 12  5 - 15 Final   Performed at [facility], [address] [place], [place] [address]   Troponin I (High Sensitivity) 04/29/2023 6  <18 ng/L Final   Comment: (NOTE) Elevated high sensitivity troponin I (hsTnI) values and significant  changes across serial measurements may suggest ACS but many other  chronic and acute conditions are known to elevate hsTnI results.  Refer to the "Links" section for chest pain algorithms and additional  guidance. Performed at [facility], [address] [place], [place] [address]    WBC 04/29/2023 9.3  4.0 - 10.5 K/uL Final   RBC 04/29/2023 4.93  4.22 - 5.81 MIL/uL Final   Hemoglobin 04/29/2023 14.3  13.0 - 17.0 g/dL Final   HCT 40/10/2723 42.7  39.0 - 52.0 % Final   MCV 04/29/2023 86.6  80.0 - 100.0 fL Final   MCH 04/29/2023 29.0  26.0 -  34.0 pg Final   MCHC 04/29/2023 33.5  30.0 - 36.0 g/dL Final   RDW 36/64/4034 14.6  11.5 - 15.5 % Final   Platelets 04/29/2023 278  150 - 400 K/uL Final   nRBC 04/29/2023 0.0  0.0 - 0.2 % Final   Neutrophils Relative % 04/29/2023 61  % Final   Neutro Abs 04/29/2023 5.8  1.7 - 7.7 K/uL Final   Lymphocytes Relative 04/29/2023 22  % Final   Lymphs Abs 04/29/2023 2.0  0.7 - 4.0 K/uL Final   Monocytes Relative 04/29/2023 12  % Final   Monocytes Absolute 04/29/2023 1.1 (H)  0.1 - 1.0 K/uL Final   Eosinophils Relative 04/29/2023 4  % Final   Eosinophils Absolute 04/29/2023 0.3  0.0 - 0.5 K/uL Final   Basophils Relative 04/29/2023 1  % Final   Basophils Absolute 04/29/2023 0.1  0.0 - 0.1 K/uL Final   Immature Granulocytes 04/29/2023 0  % Final   Abs Immature Granulocytes 04/29/2023 0.02  0.00 - 0.07 K/uL Final   Performed at [facility], [address] [place], [place] [address]   B Natriuretic Peptide 04/29/2023 21.2  0.0 -  100.0 pg/mL Final   Performed at [facility], [address] [place], [place] [address]  Admission on 02/27/2023, Discharged on 03/15/2023  Component Date Value Ref Range Status   Hgb A1c MFr Bld 03/05/2023 5.6  4.8 - 5.6 % Final   Comment: (NOTE) Pre diabetes:          5.7%-6.4%  Diabetes:              >6.4%  Glycemic control for   <7.0% adults with diabetes    Mean Plasma Glucose 03/05/2023 114.02  mg/dL Final   Performed at [facility], [address], [place], [place] [address]   Cholesterol 03/05/2023 193  0 - 200 mg/dL Final   Triglycerides 46/96/2952 178 (H)  <150 mg/dL Final   HDL 84/13/2440 52  >40 mg/dL Final   Total CHOL/HDL Ratio 03/05/2023 3.7  RATIO Final   VLDL 03/05/2023 36  0 - 40 mg/dL Final   LDL Cholesterol 03/05/2023 105 (H)  0 - 99 mg/dL Final   Comment:        Total Cholesterol/HDL:CHD Risk Coronary Heart Disease Risk Table                     Men   Women  1/2 Average Risk   3.4   3.3  Average Risk        5.0   4.4  2 X Average Risk   9.6   7.1  3 X Average Risk  23.4   11.0        Use the calculated Patient Ratio above and the CHD Risk Table to determine the patient's CHD Risk.        ATP III CLASSIFICATION (LDL):  <100     mg/dL   Optimal  102-725  mg/dL   Near or Above                    Optimal  130-159  mg/dL   Borderline  366-440  mg/dL   High  >347     mg/dL   Very High Performed at [facility], [address] [place], [place] [address]   Admission on 02/27/2023, Discharged on 02/27/2023  Component Date Value Ref Range Status   Sodium 02/27/2023 137  135 - 145 mmol/L Final   Potassium 02/27/2023 3.6  3.5 - 5.1 mmol/L Final   Chloride 02/27/2023 99  98 - 111 mmol/L Final   CO2 02/27/2023 25  22 - 32 mmol/L Final   Glucose, Bld 02/27/2023 106 (H)  70 - 99 mg/dL Final   Glucose reference range applies only to samples taken after fasting for at least 8 hours.   BUN 02/27/2023 11  6 - 20 mg/dL Final   Creatinine, Ser 02/27/2023 0.97  0.61 - 1.24 mg/dL Final   Calcium 63/87/5643 9.5  8.9 - 10.3 mg/dL Final   Total Protein 32/95/1884 7.6  6.5 - 8.1 g/dL Final   Albumin 16/60/6301 4.1  3.5 - 5.0 g/dL Final   AST 60/10/9321 27  15 - 41 U/L Final   ALT 02/27/2023 20  0 - 44 U/L Final   Alkaline Phosphatase 02/27/2023 59  38 - 126 U/L Final   Total Bilirubin 02/27/2023 1.0  0.0 - 1.2 mg/dL Final   GFR, Estimated 02/27/2023 >60  >60 mL/min Final   Comment: (NOTE) Calculated using the CKD-EPI Creatinine Equation (2021)    Anion gap 02/27/2023 13  5 - 15 Final  Performed at [facility], [address] [place], [place] [address]   Alcohol , Ethyl (B) 02/27/2023 <10  <10 mg/dL Final   Comment: (NOTE) Lowest detectable limit for serum alcohol  is 10 mg/dL.  For medical purposes only. Performed at [facility], [address] [place], [place] [address]    Salicylate Lvl 02/27/2023 <7.0 (L)  7.0 - 30.0 mg/dL Final   Performed at [facility], [address] [place] [address] [place], [place] [address]   Acetaminophen  (Tylenol ), Serum 02/27/2023 <10 (L)  10 - 30 ug/mL Final   Comment: (NOTE) Therapeutic concentrations vary significantly. A range of 10-30 ug/mL  may be an effective concentration for many patients. However, some  are best treated at concentrations outside of this range. Acetaminophen  concentrations >150 ug/mL at 4 hours after ingestion  and >50 ug/mL at 12 hours after ingestion are often associated with  toxic reactions.  Performed at [facility], [address] [place], [place] [address]    WBC 02/27/2023 7.0  4.0 - 10.5 K/uL Final   RBC 02/27/2023 4.92  4.22 - 5.81 MIL/uL Final   Hemoglobin 02/27/2023 14.4  13.0 - 17.0 g/dL Final   HCT 01/06/7251 44.1  39.0 - 52.0 % Final   MCV 02/27/2023 89.6  80.0 - 100.0 fL Final   MCH 02/27/2023 29.3  26.0 - 34.0 pg Final   MCHC 02/27/2023 32.7  30.0 - 36.0 g/dL Final   RDW 66/44/0347 14.2  11.5 - 15.5 % Final   Platelets 02/27/2023 413 (H)  150 - 400 K/uL Final   nRBC 02/27/2023 0.0  0.0 - 0.2 % Final   Performed at [facility], [address] [place], [place] [address]   Troponin I (High Sensitivity) 02/27/2023 5  <18 ng/L Final   Comment: (NOTE) Elevated high sensitivity troponin I (hsTnI) values and significant  changes across serial measurements may suggest ACS but many other  chronic and acute conditions are known to elevate hsTnI results.  Refer to the "Links" section for chest pain algorithms and additional  guidance. Performed at [facility], [address] [place], [place] [address]    SARS Coronavirus 2 by RT PCR 02/27/2023 NEGATIVE  NEGATIVE Final   Comment: (NOTE) SARS-CoV-2 target nucleic acids are NOT DETECTED.  The SARS-CoV-2 RNA is generally detectable in upper respiratory specimens during the acute phase of infection. The lowest concentration of SARS-CoV-2 viral copies this assay can detect is 138 copies/mL. A negative  result does not preclude SARS-Cov-2 infection and should not be used as the sole basis for treatment or other patient management decisions. A negative result may occur with  improper specimen collection/handling, submission of specimen other than nasopharyngeal swab, presence of viral mutation(s) within the areas targeted by this assay, and inadequate number of viral copies(<138 copies/mL). A negative result must be combined with clinical observations, patient history, and epidemiological information. The expected result is Negative.  Fact Sheet for Patients:  BloggerCourse.com  Fact Sheet for Healthcare Providers:  SeriousBroker.it  This test is no                          t yet approved or cleared by the [place]  FDA and  has been authorized for detection and/or diagnosis of SARS-CoV-2 by FDA under an Emergency Use Authorization (EUA). This EUA will remain  in effect (meaning this test can be used) for the duration of the COVID-19 declaration under Section 564(b)(1) of  the Act, 21 U.S.C.section 360bbb-3(b)(1), unless the authorization is terminated  or revoked sooner.       Influenza A by PCR 02/27/2023 NEGATIVE  NEGATIVE Final   Influenza B by PCR 02/27/2023 NEGATIVE  NEGATIVE Final   Comment: (NOTE) The Xpert Xpress SARS-CoV-2/FLU/RSV plus assay is intended as an aid in the diagnosis of influenza from Nasopharyngeal swab specimens and should not be used as a sole basis for treatment. Nasal washings and aspirates are unacceptable for Xpert Xpress SARS-CoV-2/FLU/RSV testing.  Fact Sheet for Patients: BloggerCourse.com  Fact Sheet for Healthcare Providers: SeriousBroker.it  This test is not yet approved or cleared by the [place]  FDA and has been authorized for detection and/or diagnosis of SARS-CoV-2 by FDA under an Emergency Use Authorization (EUA). This EUA will  remain in effect (meaning this test can be used) for the duration of the COVID-19 declaration under Section 564(b)(1) of the Act, 21 U.S.C. section 360bbb-3(b)(1), unless the authorization is terminated or revoked.     Resp Syncytial Virus by PCR 02/27/2023 NEGATIVE  NEGATIVE Final   Comment: (NOTE) Fact Sheet for Patients: BloggerCourse.com  Fact Sheet for Healthcare Providers: SeriousBroker.it  This test is not yet approved or cleared by the [place]  FDA and has been authorized for detection and/or diagnosis of SARS-CoV-2 by FDA under an Emergency Use Authorization (EUA). This EUA will remain in effect (meaning this test can be used) for the duration of the COVID-19 declaration under Section 564(b)(1) of the Act, 21 U.S.C. section 360bbb-3(b)(1), unless the authorization is terminated or revoked.  Performed at [facility], [address] [place], [place] [address]    Troponin I (High Sensitivity) 02/27/2023 5  <18 ng/L Final   Comment: (NOTE) Elevated high sensitivity troponin I (hsTnI) values and significant  changes across serial measurements may suggest ACS but many other  chronic and acute conditions are known to elevate hsTnI results.  Refer to the "Links" section for chest pain algorithms and additional  guidance. Performed at [facility], [address] [place], [place] [address]    Color, Urine 02/27/2023 YELLOW (A)  YELLOW Final   APPearance 02/27/2023 HAZY (A)  CLEAR Final   Specific Gravity, Urine 02/27/2023 1.021  1.005 - 1.030 Final   pH 02/27/2023 8.0  5.0 - 8.0 Final   Glucose, UA 02/27/2023 150 (A)  NEGATIVE mg/dL Final   Hgb urine dipstick 02/27/2023 NEGATIVE  NEGATIVE Final   Bilirubin Urine 02/27/2023 NEGATIVE  NEGATIVE Final   Ketones, ur 02/27/2023 NEGATIVE  NEGATIVE mg/dL Final   Protein, ur 09/81/1914 NEGATIVE  NEGATIVE mg/dL Final   Nitrite 78/29/5621 NEGATIVE  NEGATIVE  Final   Leukocytes,Ua 02/27/2023 NEGATIVE  NEGATIVE Final   RBC / HPF 02/27/2023 0-5  0 - 5 RBC/hpf Final   WBC, UA 02/27/2023 0-5  0 - 5 WBC/hpf Final   Bacteria, UA 02/27/2023 NONE SEEN  NONE SEEN Final   Squamous Epithelial / HPF 02/27/2023 0-5  0 - 5 /HPF Final   Mucus 02/27/2023 PRESENT   Final   Performed at [facility], [address] [place], [place] [address]   Specimen source GC/Chlam 02/27/2023 URINE, RANDOM   Final   Chlamydia Tr 02/27/2023 NOT DETECTED  NOT DETECTED Final   N gonorrhoeae 02/27/2023 NOT DETECTED  NOT DETECTED Final   Comment: (NOTE) This CT/NG assay has not been evaluated in patients with a history of  hysterectomy. Performed at [facility], [address], [place], [place] [address]  Tricyclic, Ur Screen 02/27/2023 NONE DETECTED  NONE DETECTED Final   Amphetamines, Ur Screen 02/27/2023 NONE DETECTED  NONE DETECTED Final   MDMA (Ecstasy)Ur Screen 02/27/2023 NONE DETECTED  NONE DETECTED Final   Cocaine Metabolite,Ur [place] 02/27/2023 POSITIVE (A)  NONE DETECTED Final   Opiate, Ur Screen 02/27/2023 NONE DETECTED  NONE DETECTED Final   Phencyclidine (PCP) Ur S 02/27/2023 NONE DETECTED  NONE DETECTED Final   Cannabinoid 50 Ng, Ur [place] 02/27/2023 NONE DETECTED  NONE DETECTED Final   Barbiturates, Ur Screen 02/27/2023 NONE DETECTED  NONE DETECTED Final   Benzodiazepine, Ur Scrn 02/27/2023 POSITIVE (A)  NONE DETECTED Final   Methadone Scn, Ur 02/27/2023 NONE DETECTED  NONE DETECTED Final   Comment: (NOTE) Tricyclics + metabolites, urine    Cutoff 1000 ng/mL Amphetamines + metabolites, urine  Cutoff 1000 ng/mL MDMA (Ecstasy), urine              Cutoff 500 ng/mL Cocaine Metabolite, urine          Cutoff 300 ng/mL Opiate + metabolites, urine        Cutoff 300 ng/mL Phencyclidine (PCP), urine         Cutoff 25 ng/mL Cannabinoid, urine                 Cutoff 50 ng/mL Barbiturates + metabolites, urine  Cutoff 200 ng/mL Benzodiazepine, urine               Cutoff 200 ng/mL Methadone, urine                   Cutoff 300 ng/mL  The urine drug screen provides only a preliminary, unconfirmed analytical test result and should not be used for non-medical purposes. Clinical consideration and professional judgment should be applied to any positive drug screen result due to possible interfering substances. A more specific alternate chemical method must be used in order to obtain a confirmed analytical result. Gas chromatography / mass spectrometry (GC/MS) is the preferred confirm                          atory method. Performed at [facility], [address] [place], [place] [address]   Admission on 02/18/2023, Discharged on 02/19/2023  Component Date Value Ref Range Status   Sodium 02/19/2023 134 (L)  135 - 145 mmol/L Final   Potassium 02/19/2023 4.3  3.5 - 5.1 mmol/L Final   Chloride 02/19/2023 100  98 - 111 mmol/L Final   CO2 02/19/2023 24  22 - 32 mmol/L Final   Glucose, Bld 02/19/2023 213 (H)  70 - 99 mg/dL Final   Glucose reference range applies only to samples taken after fasting for at least 8 hours.   BUN 02/19/2023 15  6 - 20 mg/dL Final   Creatinine, Ser 02/19/2023 0.95  0.61 - 1.24 mg/dL Final   Calcium 60/45/4098 9.5  8.9 - 10.3 mg/dL Final   Total Protein 11/91/4782 6.6  6.5 - 8.1 g/dL Final   Albumin 95/62/1308 3.4 (L)  3.5 - 5.0 g/dL Final   AST 65/78/4696 30  15 - 41 U/L Final   ALT 02/19/2023 19  0 - 44 U/L Final   Alkaline Phosphatase 02/19/2023 60  38 - 126 U/L Final   Total Bilirubin 02/19/2023 0.4  0.0 - 1.2 mg/dL Final   GFR, Estimated 02/19/2023 >60  >60 mL/min Final   Comment: (NOTE) Calculated using the CKD-EPI Creatinine Equation (2021)  Anion gap 02/19/2023 10  5 - 15 Final   Performed at [facility], [address], [place], [place] [address]   Alcohol , Ethyl (B) 02/19/2023 <10  <10 mg/dL Final   Comment: (NOTE) Lowest detectable limit for serum alcohol  is 10 mg/dL.  For medical  purposes only. Performed at [facility], [address] [place], [place] [address]    Salicylate Lvl 02/19/2023 <7.0 (L)  7.0 - 30.0 mg/dL Final   Performed at [facility], [address] [place], [place] [address]   Acetaminophen  (Tylenol ), Serum 02/19/2023 <10 (L)  10 - 30 ug/mL Final   Comment: (NOTE) Therapeutic concentrations vary significantly. A range of 10-30 ug/mL  may be an effective concentration for many patients. However, some  are best treated at concentrations outside of this range. Acetaminophen  concentrations >150 ug/mL at 4 hours after ingestion  and >50 ug/mL at 12 hours after ingestion are often associated with  toxic reactions.  Performed at [facility], [address], [place], [place] [address]    WBC 02/19/2023 4.5  4.0 - 10.5 K/uL Final   RBC 02/19/2023 4.73  4.22 - 5.81 MIL/uL Final   Hemoglobin 02/19/2023 13.8  13.0 - 17.0 g/dL Final   HCT 54/00/8676 41.8  39.0 - 52.0 % Final   MCV 02/19/2023 88.4  80.0 - 100.0 fL Final   MCH 02/19/2023 29.2  26.0 - 34.0 pg Final   MCHC 02/19/2023 33.0  30.0 - 36.0 g/dL Final   RDW 19/50/9326 13.6  11.5 - 15.5 % Final   Platelets 02/19/2023 373  150 - 400 K/uL Final   nRBC 02/19/2023 0.0  0.0 - 0.2 % Final   Performed at [facility] Lab, [address], [place], [place] [address]   Opiates 02/19/2023 NONE DETECTED  NONE DETECTED Final   Cocaine 02/19/2023 POSITIVE (A)  NONE DETECTED Final   Benzodiazepines 02/19/2023 NONE DETECTED  NONE DETECTED Final   Amphetamines 02/19/2023 NONE DETECTED  NONE DETECTED Final   Tetrahydrocannabinol 02/19/2023 NONE DETECTED  NONE DETECTED Final   Barbiturates 02/19/2023 NONE DETECTED  NONE DETECTED Final   Comment: (NOTE) DRUG SCREEN FOR MEDICAL PURPOSES ONLY.  IF CONFIRMATION IS NEEDED FOR ANY PURPOSE, NOTIFY LAB WITHIN 5 DAYS.  LOWEST DETECTABLE LIMITS FOR URINE DRUG SCREEN Drug Class                     Cutoff (ng/mL) Amphetamine and metabolites     1000 Barbiturate and metabolites    200 Benzodiazepine                 200 Opiates and metabolites        300 Cocaine and metabolites        300 THC                            50 Performed at [facility] Lab, [address] [place], [place] [address]   Admission on 02/18/2023, Discharged on 02/18/2023  Component Date Value Ref Range Status   Sodium 02/18/2023 137  135 - 145 mmol/L Final   Potassium 02/18/2023 4.1  3.5 - 5.1 mmol/L Final   Chloride 02/18/2023 97 (L)  98 - 111 mmol/L Final   CO2 02/18/2023 25  22 - 32 mmol/L Final   Glucose, Bld 02/18/2023 125 (H)  70 - 99 mg/dL Final   Glucose reference range applies  only to samples taken after fasting for at least 8 hours.   BUN 02/18/2023 11  6 - 20 mg/dL Final   Creatinine, Ser 02/18/2023 0.91  0.61 - 1.24 mg/dL Final   Calcium 81/19/1478 9.2  8.9 - 10.3 mg/dL Final   Total Protein 29/56/2130 6.9  6.5 - 8.1 g/dL Final   Albumin 86/57/8469 3.7  3.5 - 5.0 g/dL Final   AST 62/95/2841 36  15 - 41 U/L Final   ALT 02/18/2023 20  0 - 44 U/L Final   Alkaline Phosphatase 02/18/2023 52  38 - 126 U/L Final   Total Bilirubin 02/18/2023 0.7  0.0 - 1.2 mg/dL Final   GFR, Estimated 02/18/2023 >60  >60 mL/min Final   Comment: (NOTE) Calculated using the CKD-EPI Creatinine Equation (2021)    Anion gap 02/18/2023 15  5 - 15 Final   Performed at [facility] Lab, [address] [place], [place] [address]   Alcohol , Ethyl (B) 02/18/2023 16 (H)  <10 mg/dL Final   Comment: (NOTE) Lowest detectable limit for serum alcohol  is 10 mg/dL.  For medical purposes only. Performed at [facility] Lab, [address] [place], [place] [address]    WBC 02/18/2023 9.5  4.0 - 10.5 K/uL Final   RBC 02/18/2023 5.09  4.22 - 5.81 MIL/uL Final   Hemoglobin 02/18/2023 14.8  13.0 - 17.0 g/dL Final   HCT 53/66/4403 45.4  39.0 - 52.0 % Final   MCV 02/18/2023 89.2  80.0 - 100.0 fL Final   MCH 02/18/2023 29.1  26.0 - 34.0 pg Final   MCHC 02/18/2023 32.6   30.0 - 36.0 g/dL Final   RDW 47/42/5956 13.6  11.5 - 15.5 % Final   Platelets 02/18/2023 399  150 - 400 K/uL Final   nRBC 02/18/2023 0.0  0.0 - 0.2 % Final   Performed at [facility] Lab, [address] [place], [place] [address]   Opiates 02/18/2023 NONE DETECTED  NONE DETECTED Final   Cocaine 02/18/2023 POSITIVE (A)  NONE DETECTED Final   Benzodiazepines 02/18/2023 NONE DETECTED  NONE DETECTED Final   Amphetamines 02/18/2023 NONE DETECTED  NONE DETECTED Final   Tetrahydrocannabinol 02/18/2023 NONE DETECTED  NONE DETECTED Final   Barbiturates 02/18/2023 NONE DETECTED  NONE DETECTED Final   Comment: (NOTE) DRUG SCREEN FOR MEDICAL PURPOSES ONLY.  IF CONFIRMATION IS NEEDED FOR ANY PURPOSE, NOTIFY LAB WITHIN 5 DAYS.  LOWEST DETECTABLE LIMITS FOR URINE DRUG SCREEN Drug Class                     Cutoff (ng/mL) Amphetamine and metabolites    1000 Barbiturate and metabolites    200 Benzodiazepine                 200 Opiates and metabolites        300 Cocaine and metabolites        300 THC                            50 Performed at [facility] Lab, [address] [place], [place] [address]    SARS Coronavirus 2 by RT PCR 02/18/2023 NEGATIVE  NEGATIVE Final   Influenza A by PCR 02/18/2023 NEGATIVE  NEGATIVE Final   Influenza B by PCR 02/18/2023 NEGATIVE  NEGATIVE Final   Comment: (NOTE) The Xpert Xpress SARS-CoV-2/FLU/RSV plus assay is intended as an aid in the diagnosis of influenza from  Nasopharyngeal swab specimens and should not be used as a sole basis for treatment. Nasal washings and aspirates are unacceptable for Xpert Xpress SARS-CoV-2/FLU/RSV testing.  Fact Sheet for Patients: BloggerCourse.com  Fact Sheet for Healthcare Providers: SeriousBroker.it  This test is not yet approved or cleared by the [place]  FDA and has been authorized for detection and/or diagnosis of SARS-CoV-2 by FDA under an Emergency Use  Authorization (EUA). This EUA will remain in effect (meaning this test can be used) for the duration of the COVID-19 declaration under Section 564(b)(1) of the Act, 21 U.S.C. section 360bbb-3(b)(1), unless the authorization is terminated or revoked.     Resp Syncytial Virus by PCR 02/18/2023 NEGATIVE  NEGATIVE Final   Comment: (NOTE) Fact Sheet for Patients: BloggerCourse.com  Fact Sheet for Healthcare Providers: SeriousBroker.it  This test is not yet approved or cleared by the [place]  FDA and has been authorized for detection and/or diagnosis of SARS-CoV-2 by FDA under an Emergency Use Authorization (EUA). This EUA will remain in effect (meaning this test can be used) for the duration of the COVID-19 declaration under Section 564(b)(1) of the Act, 21 U.S.C. section 360bbb-3(b)(1), unless the authorization is terminated or revoked.  Performed at [facility] Lab, [address], [place], [place] [address]   There may be more visits with results that are not included.    Blood Alcohol  level:  Lab Results  Component Value Date   [facility] <15 06/07/2023   ETH <10 02/27/2023    Metabolic Disorder Labs: Lab Results  Component Value Date   HGBA1C 5.6 03/05/2023   MPG 114.02 03/05/2023   MPG 117 02/02/2009   No results found for: "PROLACTIN" Lab Results  Component Value Date   CHOL 184 06/11/2023   TRIG 98 06/11/2023   HDL 50 06/11/2023   CHOLHDL 3.7 06/11/2023   VLDL 20 06/11/2023   LDLCALC 114 (H) 06/11/2023   LDLCALC 105 (H) 03/05/2023    Therapeutic Lab Levels: No results found for: "LITHIUM" No results found for: "VALPROATE" No results found for: "CBMZ"  Physical Findings   AIMS    Flowsheet Row Admission (Discharged) from 02/02/2016 [facility] Admission (Discharged) from 03/22/2015 in [facility] Admission (Discharged) from 10/05/2014 in [facility] INPATIENT  BEHAVIORAL MEDICINE Admission (Discharged) [facility]  AIMS Total Score 0 0 0 0      AUDIT    Flowsheet Row Admission (Discharged) from 02/27/2023 in [facility] BEHAVIORAL MEDICINE Admission (Discharged) from 11/17/2022 in [facility] INPATIENT BEHAVIORAL MEDICINE Admission (Discharged) from 10/27/2022 in [facility] INPATIENT BEHAVIORAL MEDICINE Admission (Discharged) from 03/22/2015 in [facility] Admission (Discharged) from 10/05/2014 in [facility] INPATIENT BEHAVIORAL MEDICINE  Alcohol  Use Disorder Identification Test Final Score (AUDIT) 22 14 14 25 18       PHQ2-9    Flowsheet Row ED from 06/08/2023 in [facility] ED from 12/03/2022 in [facility]  PHQ-2 Total Score 4 0  PHQ-9 Total Score 9 0      Flowsheet Row ED from 06/08/2023 in [facility] ED from 06/07/2023 in [facility] Emergency Department at [facility] ED to Hosp-Admission (Discharged) from 05/16/2023 in [facility] 1C MEDICAL TELEMETRY  C-SSRS RISK CATEGORY No Risk No Risk No Risk        Musculoskeletal  Strength & Muscle Tone: within normal limits Gait & Station: normal Patient leans: N/A  Psychiatric Specialty Exam  Presentation  General Appearance:  Appropriate for Environment; Casual; Fairly Groomed  Eye Contact: Fair  Speech: Clear and Coherent; Normal Rate  Speech Volume: Normal  Handedness: Right   Mood and Affect  Mood: Anxious; Depressed; Irritable  Affect: Appropriate; Congruent; Restricted   Thought Process  Thought Processes: Coherent; Goal Directed; Linear  Descriptions of Associations:Intact  Orientation:Full (Time, Place and Person)  Thought Content:Rumination; Perseveration; WDL  Diagnosis of Schizophrenia or Schizoaffective disorder in past: No   Hallucinations:Hallucinations: None  Ideas of  Reference:None  Suicidal Thoughts:Suicidal Thoughts: No  Homicidal Thoughts:Homicidal Thoughts: No   Sensorium  Memory: Immediate Good  Judgment: Fair  Insight: Shallow   Executive Functions  Concentration: Good  Attention Span: Good  Recall: Good  Fund of Knowledge: Good  Language: Good   Psychomotor Activity  Psychomotor Activity:Psychomotor Activity: Normal   Assets  Assets: Communication Skills; Desire for Improvement; Resilience   Sleep  Sleep:Sleep: Poor   No data recorded  Physical Exam  Physical Exam Vitals and nursing note reviewed.  Constitutional:      General: He is not in acute distress.    Appearance: Normal appearance. He is not ill-appearing, toxic-appearing or diaphoretic.  HENT:     Head: Normocephalic and atraumatic.     Nose: No congestion.  Eyes:     Conjunctiva/sclera: Conjunctivae normal.  Pulmonary:     Effort: Pulmonary effort is normal. No respiratory distress.  Neurological:     General: No focal deficit present.     Mental Status: He is alert and oriented to person, place, and time.     Gait: Gait normal.    Review of Systems  Constitutional:  Positive for malaise/fatigue.  HENT:  Negative for congestion.   Respiratory:  Negative for cough, shortness of breath and wheezing.   Cardiovascular:  Negative for chest pain and palpitations.  Gastrointestinal:  Negative for abdominal pain, nausea and vomiting.  Neurological:  Negative for dizziness, tremors and headaches.   Blood pressure 115/80, pulse (!) 51, temperature 97.7 F (36.5 C), temperature source Oral, resp. rate 16, SpO2 98%. There is no height or weight on file to calculate BMI.  Treatment Plan Summary: Daily contact with patient to assess and evaluate symptoms and progress in treatment and Medication management - Please see the [facility] for detailed medications. -Continue Abilify  10 mg daily for mood stabilization -cont Inderal  10 mg BID for  hypertension/GAD -Continue Prozac  20 mg daily for depressive symptoms -Continue hydroxyzine  25 mg 3 times daily as needed for breakthrough anxiety -Continue trazodone  50 mg nightly as needed for sleep -cont agitation protocol medication as needed Zyprexa  5 mg IM or p.o. for agitation - Oral thiamine  and MVI replacement - Abstinence from substances encouraged - SW to look into options for outpatient SA treatment at discharge  -naltrexone increase to 50 mg qPM -started vit d3 1000ui daily x90 days  Labs reviewed: Ordered hemoglobin A1c as last 1 was on 03/1.  Ordered lipid panel and hemoglobin A1c.  B12 wnl. Vit d low, ~20  Roxie Kreeger, DO 06/12/2023 3:15 PM

## 2023-06-12 NOTE — ED Notes (Signed)
 Pt report that he has experienced an improvement in mood and a lessening of general anxiety since admission. Pt denies concerns or complaints. Pt denies si, hi, avh- verbal contract for safety provided. Pt provided with new set of scrubs and toiletries as requested.

## 2023-06-12 NOTE — Group Note (Signed)
 Group Topic: Positive Affirmations  Group Date: 06/12/2023 Start Time: 1610 End Time: 1700 Facilitators: Esther Hem, NT  Department: [facility]  Number of Participants: 9  Group Focus: affirmation Treatment Modality:  Psychoeducation Interventions utilized were support Purpose: increase insight and regain self-worth  Name: Franklin [name] Date of Birth: 1970-03-10  MR: [id number]    Level of Participation: active Quality of Participation: attentive, cooperative, and engaged Interactions with others: gave feedback Mood/Affect: appropriate and positive Triggers (if applicable): n/a Cognition: coherent/clear Progress: Gaining insight Response: PT shared that he wanted more for his life and that he was here to get the help that he needed. 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

## 2023-06-12 NOTE — ED Notes (Signed)
 Pt ate dinner, currently resting in room. Medications were reviewed today. Pt continues to deny complaints or discomfort.

## 2023-06-12 NOTE — Group Note (Signed)
 Group Topic: Decisional Balance/Substance Abuse  Group Date: 06/12/2023 Start Time: 1930 End Time: 2000 Facilitators: Alvino Joseph, NT  Department: [facility]  Number of Participants: 9  Group Focus: coping skills Treatment Modality:  Individual Therapy Interventions utilized were group exercise Purpose: express feelings  Name: Franklin [name] Date of Birth: 1970-03-30  MR: [id number]    Level of Participation: n/a Quality of Participation: n/a Interactions with others: n/a Mood/Affect: n/a Triggers (if applicable): n/a Cognition: n/a Progress: Other Response: n/a 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) 05/05/2023   Malingering 02/27/2023   MDD (major depressive disorder) 02/27/2023   Cocaine abuse with cocaine-induced mood disorder (HCC) 12/03/2022   Major depressive disorder, 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

## 2023-06-13 DIAGNOSIS — F1424 Cocaine dependence with cocaine-induced mood disorder: Secondary | ICD-10-CM | POA: Diagnosis not present

## 2023-06-13 DIAGNOSIS — F17210 Nicotine dependence, cigarettes, uncomplicated: Secondary | ICD-10-CM | POA: Diagnosis not present

## 2023-06-13 DIAGNOSIS — F32A Depression, unspecified: Secondary | ICD-10-CM | POA: Diagnosis not present

## 2023-06-13 DIAGNOSIS — Z5900 Homelessness unspecified: Secondary | ICD-10-CM | POA: Diagnosis not present

## 2023-06-13 LAB — HEMOGLOBIN A1C
Hgb A1c MFr Bld: 6.3 % — ABNORMAL HIGH (ref 4.8–5.6)
Mean Plasma Glucose: 134 mg/dL

## 2023-06-13 MED ORDER — HYDROCORTISONE 1 % EX OINT
TOPICAL_OINTMENT | Freq: Every day | CUTANEOUS | Status: DC | PRN
  Administered 2023-06-14: 1 via TOPICAL
  Filled 2023-06-13: qty 28.35

## 2023-06-13 NOTE — ED Notes (Signed)
 Pt sleeping at present, no distress noted.  Monitoring for safety.

## 2023-06-13 NOTE — Group Note (Signed)
 Group Topic: Social Support  Group Date: 06/13/2023 Start Time: 1645 End Time: 1717 Facilitators: Chipper Council, NT  Department: [facility]  Number of Participants: 8  Group Focus: acceptance, affirmation, check in, and coping skills Treatment Modality:  Psychoeducation Interventions utilized were exploration, patient education, and support Purpose: enhance coping skills, express feelings, express irrational fears, increase insight, regain self-worth, and reinforce self-care  Name: Franklin [name] Date of Birth: 07/30/1970  MR: [id number]    Level of Participation: moderate Quality of Participation: attentive Interactions with others: gave feedback Mood/Affect: appropriate Triggers (if applicable): n/a Cognition: coherent/clear Progress: Gaining insight Response: n/a Plan: patient will be encouraged to attend future groups.  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

## 2023-06-13 NOTE — Discharge Planning (Signed)
 SW spoke with patient and he made SW aware that he is planning to DC with his sister back to [facility] on Thursday if he is unable to get into [facility] before then. SW made call to [facility] regarding referral and Valinda Gault stated they would not have a bed until 6/23. SW explained that was entirely too far off and that he has a back up plan to return home with his sister and he could just be removed from the waiting list. SW made MD and resident aware disposition for Thursday at 9:00 and patient will need a cab voucher to meet his sister in [place]. Will continue to follow.

## 2023-06-13 NOTE — ED Provider Notes (Signed)
 Behavioral Health Progress Note  Date and Time: 06/13/2023 8:07 AM Name: Franklin [name] MRN:  [id number]  HPI: Franklin [name] is a 53 y.o. male with a history of AUD, cocaine use disorder, MDD, GAD who initially presented to the [facility] ER on 06/3 after being found acting erratically outside with a change in mental status. Pt was also very agitated, requiring Versed  to be subdued and transported to the ER for medical evaluation where he was subsequently recommended for inpatient treatment, and transferred to the [facility] for treatment and stabilization of his mental status on 06/04. Main stressor as reported on admission to the [facility] is homelessness and substance abuse.  Subjective: Patient seen in his room in his bed.  Reports no issues with sleep or appetite. Reports no physical complaints, reports some continued cravings. Denies side effects to medications. Denies withdrawal symptoms. Reports "good" mood. Denies depression, anxiety. Reports that he has an appointment to talk with [facility] today. Reports that if it doesn't work out with [facility] that his sister plans to bring him to a rehab facility in [place] . Otherwise he is homeless.    Diagnosis:  Final diagnoses:  Cocaine use disorder, severe, dependence (HCC)  Substance induced mood disorder (HCC)  Tobacco use disorder  Chronic obstructive pulmonary disease, unspecified COPD type (HCC)  Generalized anxiety disorder  Alcohol  use disorder, severe, dependence (HCC)  Vitamin D  insufficiency   Total Time spent with patient: 30 minutes  Sleep: see subjective  Appetite:  see subjective  Current Medications:  Current Facility-Administered Medications  Medication Dose Route Frequency Provider Last Rate Last Admin   ARIPiprazole  (ABILIFY ) tablet 10 mg  10 mg Oral Daily McLauchlin, Angela, NP   10 mg at 06/12/23 0915   cholecalciferol (VITAMIN D3) 25 MCG (1000 UNIT) tablet 1,000 Units  1,000 Units Oral Q breakfast  Nguyen, Julie, DO       FLUoxetine  (PROZAC ) capsule 20 mg  20 mg Oral Daily Lissa Riding, NP   20 mg at 06/12/23 0915   fluticasone  furoate-vilanterol (BREO ELLIPTA ) 200-25 MCG/ACT 1 puff  1 puff Inhalation q AM Nguyen, Julie, DO   1 puff at 06/13/23 0650   gabapentin  (NEURONTIN ) capsule 300 mg  300 mg Oral TID Lissa Riding, NP   300 mg at 06/12/23 2124   ibuprofen  (ADVIL ) tablet 600 mg  600 mg Oral Q6H PRN Lord, Jamison Y, NP       multivitamin with minerals tablet 1 tablet  1 tablet Oral Daily McLauchlin, Angela, NP   1 tablet at 06/12/23 0917   naltrexone (DEPADE) tablet 50 mg  50 mg Oral QHS Nguyen, Julie, DO   50 mg at 06/12/23 2123   propranolol  (INDERAL ) tablet 10 mg  10 mg Oral BID Nkwenti, Doris, NP   10 mg at 06/12/23 2123   thiamine  (VITAMIN B1) tablet 100 mg  100 mg Oral Daily McLauchlin, Angela, NP   100 mg at 06/12/23 4098   traZODone  (DESYREL ) tablet 50 mg  50 mg Oral QHS PRN Lord, Jamison Y, NP   50 mg at 06/11/23 2134   Current Outpatient Medications  Medication Sig Dispense Refill   albuterol  (VENTOLIN  HFA) 108 (90 Base) MCG/ACT inhaler Inhale 2 puffs into the lungs every 6 (six) hours as needed for wheezing or shortness of breath. 8 g 0   FLUoxetine  (PROZAC ) 10 MG capsule Take 3 capsules (30 mg total) by mouth daily. 90 capsule 0   fluticasone  furoate-vilanterol (BREO ELLIPTA ) 100-25 MCG/ACT  AEPB Inhale 1 puff into the lungs daily. 60 each 0   ipratropium-albuterol  (DUONEB) 0.5-2.5 (3) MG/3ML SOLN Take 3 mLs by nebulization every 6 (six) hours as needed. (Patient taking differently: Take 3 mLs by nebulization every 6 (six) hours as needed (For shortness of breath).) 360 mL 0   mometasone -formoterol  (DULERA ) 200-5 MCG/ACT AERO Inhale 2 puffs into the lungs 2 (two) times daily in the am and at bedtime..      Labs  Lab Results:  Admission on 06/08/2023  Component Date Value Ref Range Status   Vit D, 25-Hydroxy 06/11/2023 27.83 (L)  30 - 100 ng/mL Final   Comment:  (NOTE) Vitamin D  deficiency has been defined by the Institute of Medicine  and an Endocrine Society practice guideline as a level of serum 25-OH  vitamin D  less than 20 ng/mL (1,2). The Endocrine Society went on to  further define vitamin D  insufficiency as a level between 21 and 29  ng/mL (2).  1. IOM (Institute of Medicine). 2010. Dietary reference intakes for  calcium and D. [place]: [employer]. 2. Holick MF, Binkley [address], Bischoff-Ferrari HA, et al. Evaluation,  treatment, and prevention of vitamin D  deficiency: an Endocrine  Society clinical practice guideline, JCEM. 2011 Jul; 96(7): 1911-30.  Performed at [facility], [address], [place], [place] [address]    Vitamin B-12 06/11/2023 310  180 - 914 pg/mL Final   Comment: (NOTE) This assay is not validated for testing neonatal or myeloproliferative syndrome specimens for Vitamin B12 levels. Performed at [facility], [address] [place], [place] [address]    TSH 06/11/2023 0.355  0.350 - 4.500 uIU/mL Final   Comment: Performed by a 3rd Generation assay with a functional sensitivity of <=0.01 uIU/mL. Performed at [facility], [address] [place], [place] [address]    Cholesterol 06/11/2023 184  0 - 200 mg/dL Final   Triglycerides 29/56/2130 98  <150 mg/dL Final   HDL 86/57/8469 50  >40 mg/dL Final   Total CHOL/HDL Ratio 06/11/2023 3.7  RATIO Final   VLDL 06/11/2023 20  0 - 40 mg/dL Final   LDL Cholesterol 06/11/2023 114 (H)  0 - 99 mg/dL Final   Comment:        Total Cholesterol/HDL:CHD Risk Coronary Heart Disease Risk Table                     Men   Women  1/2 Average Risk   3.4   3.3  Average Risk       5.0   4.4  2 X Average Risk   9.6   7.1  3 X Average Risk  23.4   11.0        Use the calculated Patient Ratio above and the CHD Risk Table to determine the patient's CHD Risk.        ATP III CLASSIFICATION (LDL):  <100     mg/dL   Optimal  629-528  mg/dL   Near  or Above                    Optimal  130-159  mg/dL   Borderline  413-244  mg/dL   High  >010     mg/dL   Very High Performed at [facility], [address], [place], [place] [address]   Admission on 06/07/2023, Discharged on 06/08/2023  Component Date Value Ref Range Status   WBC  06/07/2023 6.9  4.0 - 10.5 K/uL Final   RBC 06/07/2023 4.11 (L)  4.22 - 5.81 MIL/uL Final   Hemoglobin 06/07/2023 11.8 (L)  13.0 - 17.0 g/dL Final   HCT 96/04/5407 35.9 (L)  39.0 - 52.0 % Final   MCV 06/07/2023 87.3  80.0 - 100.0 fL Final   MCH 06/07/2023 28.7  26.0 - 34.0 pg Final   MCHC 06/07/2023 32.9  30.0 - 36.0 g/dL Final   RDW 81/19/1478 15.1  11.5 - 15.5 % Final   Platelets 06/07/2023 249  150 - 400 K/uL Final   nRBC 06/07/2023 0.0  0.0 - 0.2 % Final   Neutrophils Relative % 06/07/2023 66  % Final   Neutro Abs 06/07/2023 4.6  1.7 - 7.7 K/uL Final   Lymphocytes Relative 06/07/2023 22  % Final   Lymphs Abs 06/07/2023 1.5  0.7 - 4.0 K/uL Final   Monocytes Relative 06/07/2023 9  % Final   Monocytes Absolute 06/07/2023 0.6  0.1 - 1.0 K/uL Final   Eosinophils Relative 06/07/2023 2  % Final   Eosinophils Absolute 06/07/2023 0.1  0.0 - 0.5 K/uL Final   Basophils Relative 06/07/2023 1  % Final   Basophils Absolute 06/07/2023 0.0  0.0 - 0.1 K/uL Final   Immature Granulocytes 06/07/2023 0  % Final   Abs Immature Granulocytes 06/07/2023 0.03  0.00 - 0.07 K/uL Final   Performed at [facility], [address] [place], [place] [address]   Sodium 06/07/2023 138  135 - 145 mmol/L Final   Potassium 06/07/2023 3.7  3.5 - 5.1 mmol/L Final   Chloride 06/07/2023 104  98 - 111 mmol/L Final   CO2 06/07/2023 24  22 - 32 mmol/L Final   Glucose, Bld 06/07/2023 154 (H)  70 - 99 mg/dL Final   Glucose reference range applies only to samples taken after fasting for at least 8 hours.   BUN 06/07/2023 14  6 - 20 mg/dL Final   Creatinine, Ser 06/07/2023 0.88  0.61 - 1.24 mg/dL Final   Calcium 13/08/6576 9.3   8.9 - 10.3 mg/dL Final   Total Protein 46/96/2952 6.2 (L)  6.5 - 8.1 g/dL Final   Albumin 84/13/2440 3.7  3.5 - 5.0 g/dL Final   AST 10/31/2534 29  15 - 41 U/L Final   ALT 06/07/2023 20  0 - 44 U/L Final   Alkaline Phosphatase 06/07/2023 62  38 - 126 U/L Final   Total Bilirubin 06/07/2023 2.0 (H)  0.0 - 1.2 mg/dL Final   GFR, Estimated 06/07/2023 >60  >60 mL/min Final   Comment: (NOTE) Calculated using the CKD-EPI Creatinine Equation (2021)    Anion gap 06/07/2023 10  5 - 15 Final   Performed at [facility], [address] [place], [place] [address]   Salicylate Lvl 06/07/2023 <7.0 (L)  7.0 - 30.0 mg/dL Final   Performed at [facility], [address] [place], [place] [address]   Acetaminophen  (Tylenol ), Serum 06/07/2023 <10 (L)  10 - 30 ug/mL Final   Comment: (NOTE) Therapeutic concentrations vary significantly. A range of 10-30 ug/mL  may be an effective concentration for many patients. However, some  are best treated at concentrations outside of this range. Acetaminophen  concentrations >150 ug/mL at 4 hours after ingestion  and >50 ug/mL at 12 hours after ingestion are often associated with  toxic reactions.  Performed at [facility], [address] [place], [place] [address]    Alcohol , Ethyl (B) 06/07/2023 <15  <  15 mg/dL Final   Comment: (NOTE) For medical purposes only. Performed at [facility], [address] [place], [place] [address]    Tricyclic, Ur Screen 06/08/2023 NONE DETECTED  NONE DETECTED Final   Amphetamines, Ur Screen 06/08/2023 NONE DETECTED  NONE DETECTED Final   MDMA (Ecstasy)Ur Screen 06/08/2023 NONE DETECTED  NONE DETECTED Final   Cocaine Metabolite,Ur [place] 06/08/2023 POSITIVE (A)  NONE DETECTED Final   Opiate, Ur Screen 06/08/2023 NONE DETECTED  NONE DETECTED Final   Phencyclidine (PCP) Ur S 06/08/2023 NONE DETECTED  NONE DETECTED Final   Cannabinoid 50 Ng, Ur [place] 06/08/2023 NONE DETECTED  NONE DETECTED  Final   Barbiturates, Ur Screen 06/08/2023 NONE DETECTED  NONE DETECTED Final   Benzodiazepine, Ur Scrn 06/08/2023 POSITIVE (A)  NONE DETECTED Final   Methadone Scn, Ur 06/08/2023 NONE DETECTED  NONE DETECTED Final   Comment: (NOTE) Tricyclics + metabolites, urine    Cutoff 1000 ng/mL Amphetamines + metabolites, urine  Cutoff 1000 ng/mL MDMA (Ecstasy), urine              Cutoff 500 ng/mL Cocaine Metabolite, urine          Cutoff 300 ng/mL Opiate + metabolites, urine        Cutoff 300 ng/mL Phencyclidine (PCP), urine         Cutoff 25 ng/mL Cannabinoid, urine                 Cutoff 50 ng/mL Barbiturates + metabolites, urine  Cutoff 200 ng/mL Benzodiazepine, urine              Cutoff 200 ng/mL Methadone, urine                   Cutoff 300 ng/mL  The urine drug screen provides only a preliminary, unconfirmed analytical test result and should not be used for non-medical purposes. Clinical consideration and professional judgment should be applied to any positive drug screen result due to possible interfering substances. A more specific alternate chemical method must be used in order to obtain a confirmed analytical result. Gas chromatography / mass spectrometry (GC/MS) is the preferred confirm                          atory method. Performed at [facility], [address] [place], [place] [address]   Admission on 05/16/2023, Discharged on 05/17/2023  Component Date Value Ref Range Status   WBC 05/16/2023 8.2  4.0 - 10.5 K/uL Final   RBC 05/16/2023 4.86  4.22 - 5.81 MIL/uL Final   Hemoglobin 05/16/2023 14.3  13.0 - 17.0 g/dL Final   HCT 62/95/2841 43.8  39.0 - 52.0 % Final   MCV 05/16/2023 90.1  80.0 - 100.0 fL Final   MCH 05/16/2023 29.4  26.0 - 34.0 pg Final   MCHC 05/16/2023 32.6  30.0 - 36.0 g/dL Final   RDW 32/44/0102 15.2  11.5 - 15.5 % Final   Platelets 05/16/2023 348  150 - 400 K/uL Final   nRBC 05/16/2023 0.0  0.0 - 0.2 % Final   Neutrophils Relative %  05/16/2023 61  % Final   Neutro Abs 05/16/2023 5.2  1.7 - 7.7 K/uL Final   Lymphocytes Relative 05/16/2023 30  % Final   Lymphs Abs 05/16/2023 2.4  0.7 - 4.0 K/uL Final   Monocytes Relative 05/16/2023 5  % Final   Monocytes Absolute 05/16/2023 0.4  0.1 - 1.0 K/uL Final  Eosinophils Relative 05/16/2023 3  % Final   Eosinophils Absolute 05/16/2023 0.2  0.0 - 0.5 K/uL Final   Basophils Relative 05/16/2023 1  % Final   Basophils Absolute 05/16/2023 0.1  0.0 - 0.1 K/uL Final   Immature Granulocytes 05/16/2023 0  % Final   Abs Immature Granulocytes 05/16/2023 0.02  0.00 - 0.07 K/uL Final   Performed at [facility], [address] [place], [place] [address]   Sodium 05/16/2023 139  135 - 145 mmol/L Final   Potassium 05/16/2023 3.2 (L)  3.5 - 5.1 mmol/L Final   Chloride 05/16/2023 102  98 - 111 mmol/L Final   CO2 05/16/2023 24  22 - 32 mmol/L Final   Glucose, Bld 05/16/2023 125 (H)  70 - 99 mg/dL Final   Glucose reference range applies only to samples taken after fasting for at least 8 hours.   BUN 05/16/2023 7  6 - 20 mg/dL Final   Creatinine, Ser 05/16/2023 1.08  0.61 - 1.24 mg/dL Final   Calcium 19/14/7829 9.1  8.9 - 10.3 mg/dL Final   GFR, Estimated 05/16/2023 >60  >60 mL/min Final   Comment: (NOTE) Calculated using the CKD-EPI Creatinine Equation (2021)    Anion gap 05/16/2023 13  5 - 15 Final   Performed at [facility], [address] [place], [place] [address]   Troponin I (High Sensitivity) 05/16/2023 6  <18 ng/L Final   Comment: (NOTE) Elevated high sensitivity troponin I (hsTnI) values and significant  changes across serial measurements may suggest ACS but many other  chronic and acute conditions are known to elevate hsTnI results.  Refer to the "Links" section for chest pain algorithms and additional  guidance. Performed at [facility], [address] [place], [place] [address]    SARS Coronavirus 2 by RT PCR 05/16/2023 NEGATIVE  NEGATIVE  Final   Comment: (NOTE) SARS-CoV-2 target nucleic acids are NOT DETECTED.  The SARS-CoV-2 RNA is generally detectable in upper respiratory specimens during the acute phase of infection. The lowest concentration of SARS-CoV-2 viral copies this assay can detect is 138 copies/mL. A negative result does not preclude SARS-Cov-2 infection and should not be used as the sole basis for treatment or other patient management decisions. A negative result may occur with  improper specimen collection/handling, submission of specimen other than nasopharyngeal swab, presence of viral mutation(s) within the areas targeted by this assay, and inadequate number of viral copies(<138 copies/mL). A negative result must be combined with clinical observations, patient history, and epidemiological information. The expected result is Negative.  Fact Sheet for Patients:  BloggerCourse.com  Fact Sheet for Healthcare Providers:  SeriousBroker.it  This test is no                          t yet approved or cleared by the [place]  FDA and  has been authorized for detection and/or diagnosis of SARS-CoV-2 by FDA under an Emergency Use Authorization (EUA). This EUA will remain  in effect (meaning this test can be used) for the duration of the COVID-19 declaration under Section 564(b)(1) of the Act, 21 U.S.C.section 360bbb-3(b)(1), unless the authorization is terminated  or revoked sooner.       Influenza A by PCR 05/16/2023 NEGATIVE  NEGATIVE Final   Influenza B by PCR 05/16/2023 NEGATIVE  NEGATIVE Final   Comment: (NOTE) The Xpert Xpress SARS-CoV-2/FLU/RSV plus assay is intended as an aid in the diagnosis of influenza from Nasopharyngeal  swab specimens and should not be used as a sole basis for treatment. Nasal washings and aspirates are unacceptable for Xpert Xpress SARS-CoV-2/FLU/RSV testing.  Fact Sheet for  Patients: BloggerCourse.com  Fact Sheet for Healthcare Providers: SeriousBroker.it  This test is not yet approved or cleared by the [place]  FDA and has been authorized for detection and/or diagnosis of SARS-CoV-2 by FDA under an Emergency Use Authorization (EUA). This EUA will remain in effect (meaning this test can be used) for the duration of the COVID-19 declaration under Section 564(b)(1) of the Act, 21 U.S.C. section 360bbb-3(b)(1), unless the authorization is terminated or revoked.     Resp Syncytial Virus by PCR 05/16/2023 NEGATIVE  NEGATIVE Final   Comment: (NOTE) Fact Sheet for Patients: BloggerCourse.com  Fact Sheet for Healthcare Providers: SeriousBroker.it  This test is not yet approved or cleared by the [place]  FDA and has been authorized for detection and/or diagnosis of SARS-CoV-2 by FDA under an Emergency Use Authorization (EUA). This EUA will remain in effect (meaning this test can be used) for the duration of the COVID-19 declaration under Section 564(b)(1) of the Act, 21 U.S.C. section 360bbb-3(b)(1), unless the authorization is terminated or revoked.  Performed at [facility], [address] [place], [place] [address]    pH, Ven 05/16/2023 7.38  7.25 - 7.43 Final   pCO2, Ven 05/16/2023 44  44 - 60 mmHg Final   pO2, Ven 05/16/2023 96 (H)  32 - 45 mmHg Final   Bicarbonate 05/16/2023 26.0  20.0 - 28.0 mmol/L Final   Acid-Base Excess 05/16/2023 0.5  0.0 - 2.0 mmol/L Final   O2 Saturation 05/16/2023 98.7  % Final   Patient temperature 05/16/2023 37.0   Final   Collection site 05/16/2023 VEIN   Final   Performed at [facility], [address] [place], [place] [address]   Troponin I (High Sensitivity) 05/16/2023 5  <18 ng/L Final   Comment: (NOTE) Elevated high sensitivity troponin I (hsTnI) values and significant  changes  across serial measurements may suggest ACS but many other  chronic and acute conditions are known to elevate hsTnI results.  Refer to the "Links" section for chest pain algorithms and additional  guidance. Performed at [facility], [address] [place], [place] [address]    Tricyclic, Ur Screen 05/16/2023 NONE DETECTED  NONE DETECTED Final   Amphetamines, Ur Screen 05/16/2023 POSITIVE (A)  NONE DETECTED Final   Comment: (NOTE) Trazodone  is metabolized in vivo to several metabolites, including pharmacologically active m-CPP, which is excreted in the urine. Immunoassay screens for amphetamines and MDMA have potential cross-reactivity with these compounds and may provide false positive  results.     MDMA (Ecstasy)Ur Screen 05/16/2023 NONE DETECTED  NONE DETECTED Final   Cocaine Metabolite,Ur [place] 05/16/2023 POSITIVE (A)  NONE DETECTED Final   Opiate, Ur Screen 05/16/2023 NONE DETECTED  NONE DETECTED Final   Phencyclidine (PCP) Ur S 05/16/2023 NONE DETECTED  NONE DETECTED Final   Cannabinoid 50 Ng, Ur [place] 05/16/2023 NONE DETECTED  NONE DETECTED Final   Barbiturates, Ur Screen 05/16/2023 NONE DETECTED  NONE DETECTED Final   Benzodiazepine, Ur Scrn 05/16/2023 NONE DETECTED  NONE DETECTED Final   Methadone Scn, Ur 05/16/2023 NONE DETECTED  NONE DETECTED Final   Comment: (NOTE) Tricyclics + metabolites, urine    Cutoff 1000 ng/mL Amphetamines + metabolites, urine  Cutoff 1000 ng/mL MDMA (Ecstasy), urine              Cutoff 500 ng/mL  Cocaine Metabolite, urine          Cutoff 300 ng/mL Opiate + metabolites, urine        Cutoff 300 ng/mL Phencyclidine (PCP), urine         Cutoff 25 ng/mL Cannabinoid, urine                 Cutoff 50 ng/mL Barbiturates + metabolites, urine  Cutoff 200 ng/mL Benzodiazepine, urine              Cutoff 200 ng/mL Methadone, urine                   Cutoff 300 ng/mL  The urine drug screen provides only a preliminary, unconfirmed analytical test result  and should not be used for non-medical purposes. Clinical consideration and professional judgment should be applied to any positive drug screen result due to possible interfering substances. A more specific alternate chemical method must be used in order to obtain a confirmed analytical result. Gas chromatography / mass spectrometry (GC/MS) is the preferred confirm                          atory method. Performed at [facility], [address] [place], [place] [address]    Sodium 05/17/2023 136  135 - 145 mmol/L Final   Potassium 05/17/2023 3.7  3.5 - 5.1 mmol/L Final   Chloride 05/17/2023 104  98 - 111 mmol/L Final   CO2 05/17/2023 25  22 - 32 mmol/L Final   Glucose, Bld 05/17/2023 124 (H)  70 - 99 mg/dL Final   Glucose reference range applies only to samples taken after fasting for at least 8 hours.   BUN 05/17/2023 12  6 - 20 mg/dL Final   Creatinine, Ser 05/17/2023 0.92  0.61 - 1.24 mg/dL Final   Calcium 30/86/5784 8.6 (L)  8.9 - 10.3 mg/dL Final   GFR, Estimated 05/17/2023 >60  >60 mL/min Final   Comment: (NOTE) Calculated using the CKD-EPI Creatinine Equation (2021)    Anion gap 05/17/2023 7  5 - 15 Final   Performed at [facility], [address] [place], [place] [address]   Magnesium  05/17/2023 2.1  1.7 - 2.4 mg/dL Final   Performed at [facility], [address] [place], [place] [address]   WBC 05/17/2023 10.4  4.0 - 10.5 K/uL Final   RBC 05/17/2023 4.30  4.22 - 5.81 MIL/uL Final   Hemoglobin 05/17/2023 12.6 (L)  13.0 - 17.0 g/dL Final   HCT 32/44/0102 38.5 (L)  39.0 - 52.0 % Final   MCV 05/17/2023 89.5  80.0 - 100.0 fL Final   MCH 05/17/2023 29.3  26.0 - 34.0 pg Final   MCHC 05/17/2023 32.7  30.0 - 36.0 g/dL Final   RDW 72/53/6644 15.3  11.5 - 15.5 % Final   Platelets 05/17/2023 311  150 - 400 K/uL Final   nRBC 05/17/2023 0.0  0.0 - 0.2 % Final   Performed at [facility], [address] [place], [place] [address]   Admission on 05/09/2023, Discharged on 05/09/2023  Component Date Value Ref Range Status   SARS Coronavirus 2 by RT PCR 05/09/2023 NEGATIVE  NEGATIVE Final   Influenza A by PCR 05/09/2023 NEGATIVE  NEGATIVE Final   Influenza B by PCR 05/09/2023 NEGATIVE  NEGATIVE Final   Comment: (NOTE) The Xpert Xpress SARS-CoV-2/FLU/RSV plus assay is intended as an aid in the diagnosis of influenza from Nasopharyngeal  swab specimens and should not be used as a sole basis for treatment. Nasal washings and aspirates are unacceptable for Xpert Xpress SARS-CoV-2/FLU/RSV testing.  Fact Sheet for Patients: BloggerCourse.com  Fact Sheet for Healthcare Providers: SeriousBroker.it  This test is not yet approved or cleared by the [place]  FDA and has been authorized for detection and/or diagnosis of SARS-CoV-2 by FDA under an Emergency Use Authorization (EUA). This EUA will remain in effect (meaning this test can be used) for the duration of the COVID-19 declaration under Section 564(b)(1) of the Act, 21 U.S.C. section 360bbb-3(b)(1), unless the authorization is terminated or revoked.     Resp Syncytial Virus by PCR 05/09/2023 NEGATIVE  NEGATIVE Final   Comment: (NOTE) Fact Sheet for Patients: BloggerCourse.com  Fact Sheet for Healthcare Providers: SeriousBroker.it  This test is not yet approved or cleared by the [place]  FDA and has been authorized for detection and/or diagnosis of SARS-CoV-2 by FDA under an Emergency Use Authorization (EUA). This EUA will remain in effect (meaning this test can be used) for the duration of the COVID-19 declaration under Section 564(b)(1) of the Act, 21 U.S.C. section 360bbb-3(b)(1), unless the authorization is terminated or revoked.  Performed at [facility], [address], [place], [place] [address]    Sodium 05/09/2023 139  135 - 145 mmol/L  Final   Potassium 05/09/2023 4.1  3.5 - 5.1 mmol/L Final   Chloride 05/09/2023 102  98 - 111 mmol/L Final   CO2 05/09/2023 26  22 - 32 mmol/L Final   Glucose, Bld 05/09/2023 191 (H)  70 - 99 mg/dL Final   Glucose reference range applies only to samples taken after fasting for at least 8 hours.   BUN 05/09/2023 15  6 - 20 mg/dL Final   Creatinine, Ser 05/09/2023 1.08  0.61 - 1.24 mg/dL Final   Calcium 84/13/2440 9.1  8.9 - 10.3 mg/dL Final   Total Protein 10/31/2534 6.7  6.5 - 8.1 g/dL Final   Albumin 64/40/3474 3.8  3.5 - 5.0 g/dL Final   AST 25/95/6387 30  15 - 41 U/L Final   ALT 05/09/2023 24  0 - 44 U/L Final   Alkaline Phosphatase 05/09/2023 57  38 - 126 U/L Final   Total Bilirubin 05/09/2023 1.1  0.0 - 1.2 mg/dL Final   GFR, Estimated 05/09/2023 >60  >60 mL/min Final   Comment: (NOTE) Calculated using the CKD-EPI Creatinine Equation (2021)    Anion gap 05/09/2023 11  5 - 15 Final   Performed at [facility], [address], [place], [place] [address]   WBC 05/09/2023 10.4  4.0 - 10.5 K/uL Final   RBC 05/09/2023 5.36  4.22 - 5.81 MIL/uL Final   Hemoglobin 05/09/2023 15.5  13.0 - 17.0 g/dL Final   HCT 29/51/8841 47.1  39.0 - 52.0 % Final   MCV 05/09/2023 87.9  80.0 - 100.0 fL Final   MCH 05/09/2023 28.9  26.0 - 34.0 pg Final   MCHC 05/09/2023 32.9  30.0 - 36.0 g/dL Final   RDW 66/06/3014 14.9  11.5 - 15.5 % Final   Platelets 05/09/2023 369  150 - 400 K/uL Final   nRBC 05/09/2023 0.0  0.0 - 0.2 % Final   Performed at [facility], [address], [place], [place] [address]   pH, Ven 05/09/2023 7.4  7.25 - 7.43 Final   pCO2, Ven 05/09/2023 47  44 - 60 mmHg Final   pO2, Ven 05/09/2023 89 (H)  32 - 45 mmHg Final   Bicarbonate 05/09/2023 29.1 (H)  20.0 - 28.0 mmol/L Final   Acid-Base Excess 05/09/2023 3.5 (H)  0.0 - 2.0 mmol/L Final   O2 Saturation 05/09/2023 98.8  % Final   Patient temperature 05/09/2023 37.0   Final   Collection site 05/09/2023 LEFT ANTECUBITAL    Final   IV   Drawn by 05/09/2023 DRAWN BY RN   Final   Performed at [facility], [address] [place], [place] [address]  Admission on 05/04/2023, Discharged on 05/07/2023  Component Date Value Ref Range Status   WBC 05/04/2023 10.0  4.0 - 10.5 K/uL Final   RBC 05/04/2023 5.15  4.22 - 5.81 MIL/uL Final   Hemoglobin 05/04/2023 14.9  13.0 - 17.0 g/dL Final   HCT 60/45/4098 46.2  39.0 - 52.0 % Final   MCV 05/04/2023 89.7  80.0 - 100.0 fL Final   MCH 05/04/2023 28.9  26.0 - 34.0 pg Final   MCHC 05/04/2023 32.3  30.0 - 36.0 g/dL Final   RDW 11/91/4782 14.8  11.5 - 15.5 % Final   Platelets 05/04/2023 342  150 - 400 K/uL Final   nRBC 05/04/2023 0.0  0.0 - 0.2 % Final   Neutrophils Relative % 05/04/2023 58  % Final   Neutro Abs 05/04/2023 5.8  1.7 - 7.7 K/uL Final   Lymphocytes Relative 05/04/2023 29  % Final   Lymphs Abs 05/04/2023 2.9  0.7 - 4.0 K/uL Final   Monocytes Relative 05/04/2023 5  % Final   Monocytes Absolute 05/04/2023 0.5  0.1 - 1.0 K/uL Final   Eosinophils Relative 05/04/2023 7  % Final   Eosinophils Absolute 05/04/2023 0.7 (H)  0.0 - 0.5 K/uL Final   Basophils Relative 05/04/2023 1  % Final   Basophils Absolute 05/04/2023 0.1  0.0 - 0.1 K/uL Final   Immature Granulocytes 05/04/2023 0  % Final   Abs Immature Granulocytes 05/04/2023 0.03  0.00 - 0.07 K/uL Final   Performed at [facility], [address], [place], [place] [address]   Sodium 05/04/2023 139  135 - 145 mmol/L Final   Potassium 05/04/2023 3.8  3.5 - 5.1 mmol/L Final   Chloride 05/04/2023 106  98 - 111 mmol/L Final   CO2 05/04/2023 25  22 - 32 mmol/L Final   Glucose, Bld 05/04/2023 138 (H)  70 - 99 mg/dL Final   Glucose reference range applies only to samples taken after fasting for at least 8 hours.   BUN 05/04/2023 10  6 - 20 mg/dL Final   Creatinine, Ser 05/04/2023 0.90  0.61 - 1.24 mg/dL Final   Calcium 30/86/5784 9.0  8.9 - 10.3 mg/dL Final   GFR, Estimated 05/04/2023 >60  >60 mL/min Final    Comment: (NOTE) Calculated using the CKD-EPI Creatinine Equation (2021)    Anion gap 05/04/2023 8  5 - 15 Final   Performed at [facility], [address] [place], [place] [address]   pH, Ven 05/04/2023 7.32  7.25 - 7.43 Final   pCO2, Ven 05/04/2023 60  44 - 60 mmHg Final   pO2, Ven 05/04/2023 42  32 - 45 mmHg Final   Bicarbonate 05/04/2023 30.7 (H)  20.0 - 28.0 mmol/L Final   Acid-Base Excess 05/04/2023 2.8 (H)  0.0 - 2.0 mmol/L Final   O2 Saturation 05/04/2023 72.7  % Final   Patient temperature 05/04/2023 36.5   Final   Performed at [facility], [address] [place], [place] [address]   SARS Coronavirus 2 by RT PCR 05/04/2023 NEGATIVE  NEGATIVE Final   Influenza A by PCR 05/04/2023 NEGATIVE  NEGATIVE Final   Influenza B by PCR 05/04/2023 NEGATIVE  NEGATIVE Final   Comment: (NOTE) The Xpert Xpress SARS-CoV-2/FLU/RSV plus assay is intended as an aid in the diagnosis of influenza from Nasopharyngeal swab specimens and should not be used as a sole basis for treatment. Nasal washings and aspirates are unacceptable for Xpert Xpress SARS-CoV-2/FLU/RSV testing.  Fact Sheet for Patients: BloggerCourse.com  Fact Sheet for Healthcare Providers: SeriousBroker.it  This test is not yet approved or cleared by the [place]  FDA and has been authorized for detection and/or diagnosis of SARS-CoV-2 by FDA under an Emergency Use Authorization (EUA). This EUA will remain in effect (meaning this test can be used) for the duration of the COVID-19 declaration under Section 564(b)(1) of the Act, 21 U.S.C. section 360bbb-3(b)(1), unless the authorization is terminated or revoked.     Resp Syncytial Virus by PCR 05/04/2023 NEGATIVE  NEGATIVE Final   Comment: (NOTE) Fact Sheet for Patients: BloggerCourse.com  Fact Sheet for Healthcare Providers: SeriousBroker.it  This test  is not yet approved or cleared by the [place]  FDA and has been authorized for detection and/or diagnosis of SARS-CoV-2 by FDA under an Emergency Use Authorization (EUA). This EUA will remain in effect (meaning this test can be used) for the duration of the COVID-19 declaration under Section 564(b)(1) of the Act, 21 U.S.C. section 360bbb-3(b)(1), unless the authorization is terminated or revoked.  Performed at [facility], [address] [place], [place] [address]    Neisseria Gonorrhea 05/05/2023 Negative   Final   Chlamydia 05/05/2023 Negative   Final   Comment 05/05/2023 Normal Reference Ranger Chlamydia - Negative   Final   Comment 05/05/2023 Normal Reference Range Neisseria Gonorrhea - Negative   Final   HIV Screen 4th Generation wRfx 05/05/2023 Non Reactive  Non Reactive Final   Performed at [facility], [address] [place], [place] [address]   Sodium 05/06/2023 139  135 - 145 mmol/L Final   Potassium 05/06/2023 3.8  3.5 - 5.1 mmol/L Final   Chloride 05/06/2023 107  98 - 111 mmol/L Final   CO2 05/06/2023 23  22 - 32 mmol/L Final   Glucose, Bld 05/06/2023 169 (H)  70 - 99 mg/dL Final   Glucose reference range applies only to samples taken after fasting for at least 8 hours.   BUN 05/06/2023 10  6 - 20 mg/dL Final   Creatinine, Ser 05/06/2023 0.90  0.61 - 1.24 mg/dL Final   Calcium 21/30/8657 9.0  8.9 - 10.3 mg/dL Final   GFR, Estimated 05/06/2023 >60  >60 mL/min Final   Comment: (NOTE) Calculated using the CKD-EPI Creatinine Equation (2021)    Anion gap 05/06/2023 9  5 - 15 Final   Performed at [facility], [address] [place], [place] [address]   WBC 05/06/2023 15.4 (H)  4.0 - 10.5 K/uL Final   RBC 05/06/2023 4.59  4.22 - 5.81 MIL/uL Final   Hemoglobin 05/06/2023 13.2  13.0 - 17.0 g/dL Final   HCT 29/52/8413 40.3  39.0 - 52.0 % Final   MCV 05/06/2023 87.8  80.0 - 100.0 fL Final   MCH 05/06/2023 28.8  26.0 - 34.0 pg Final   MCHC 05/06/2023  32.8  30.0 - 36.0 g/dL Final   RDW 24/40/1027 15.1  11.5 - 15.5 %  Final   Platelets 05/06/2023 317  150 - 400 K/uL Final   nRBC 05/06/2023 0.0  0.0 - 0.2 % Final   Performed at [facility], [address], [place], [place] [address]  Admission on 04/29/2023, Discharged on 04/29/2023  Component Date Value Ref Range Status   Sodium 04/29/2023 138  135 - 145 mmol/L Final   Potassium 04/29/2023 3.8  3.5 - 5.1 mmol/L Final   Chloride 04/29/2023 104  98 - 111 mmol/L Final   CO2 04/29/2023 22  22 - 32 mmol/L Final   Glucose, Bld 04/29/2023 101 (H)  70 - 99 mg/dL Final   Glucose reference range applies only to samples taken after fasting for at least 8 hours.   BUN 04/29/2023 20  6 - 20 mg/dL Final   Creatinine, Ser 04/29/2023 1.11  0.61 - 1.24 mg/dL Final   Calcium 57/84/6962 9.5  8.9 - 10.3 mg/dL Final   Total Protein 95/28/4132 8.1  6.5 - 8.1 g/dL Final   Albumin 44/01/270 4.4  3.5 - 5.0 g/dL Final   AST 53/66/4403 25  15 - 41 U/L Final   ALT 04/29/2023 19  0 - 44 U/L Final   Alkaline Phosphatase 04/29/2023 64  38 - 126 U/L Final   Total Bilirubin 04/29/2023 2.0 (H)  0.0 - 1.2 mg/dL Final   GFR, Estimated 04/29/2023 >60  >60 mL/min Final   Comment: (NOTE) Calculated using the CKD-EPI Creatinine Equation (2021)    Anion gap 04/29/2023 12  5 - 15 Final   Performed at [facility], [address] [place], [place] [address]   Troponin I (High Sensitivity) 04/29/2023 6  <18 ng/L Final   Comment: (NOTE) Elevated high sensitivity troponin I (hsTnI) values and significant  changes across serial measurements may suggest ACS but many other  chronic and acute conditions are known to elevate hsTnI results.  Refer to the "Links" section for chest pain algorithms and additional  guidance. Performed at [facility], [address] [place], [place] [address]    WBC 04/29/2023 9.3  4.0 - 10.5 K/uL Final   RBC 04/29/2023 4.93  4.22 - 5.81 MIL/uL Final   Hemoglobin  04/29/2023 14.3  13.0 - 17.0 g/dL Final   HCT 75/64/3329 42.7  39.0 - 52.0 % Final   MCV 04/29/2023 86.6  80.0 - 100.0 fL Final   MCH 04/29/2023 29.0  26.0 - 34.0 pg Final   MCHC 04/29/2023 33.5  30.0 - 36.0 g/dL Final   RDW 51/88/4166 14.6  11.5 - 15.5 % Final   Platelets 04/29/2023 278  150 - 400 K/uL Final   nRBC 04/29/2023 0.0  0.0 - 0.2 % Final   Neutrophils Relative % 04/29/2023 61  % Final   Neutro Abs 04/29/2023 5.8  1.7 - 7.7 K/uL Final   Lymphocytes Relative 04/29/2023 22  % Final   Lymphs Abs 04/29/2023 2.0  0.7 - 4.0 K/uL Final   Monocytes Relative 04/29/2023 12  % Final   Monocytes Absolute 04/29/2023 1.1 (H)  0.1 - 1.0 K/uL Final   Eosinophils Relative 04/29/2023 4  % Final   Eosinophils Absolute 04/29/2023 0.3  0.0 - 0.5 K/uL Final   Basophils Relative 04/29/2023 1  % Final   Basophils Absolute 04/29/2023 0.1  0.0 - 0.1 K/uL Final   Immature Granulocytes 04/29/2023 0  % Final   Abs Immature Granulocytes 04/29/2023 0.02  0.00 - 0.07 K/uL Final   Performed at [facility], [address] [place], [place] [address]   B Natriuretic Peptide 04/29/2023 21.2  0.0 - 100.0 pg/mL Final   Performed at [facility], [address] [place], [place] [address]  Admission on 02/27/2023, Discharged on 03/15/2023  Component Date Value Ref Range Status   Hgb A1c MFr Bld 03/05/2023 5.6  4.8 - 5.6 % Final   Comment: (NOTE) Pre diabetes:          5.7%-6.4%  Diabetes:              >6.4%  Glycemic control for   <7.0% adults with diabetes    Mean Plasma Glucose 03/05/2023 114.02  mg/dL Final   Performed at [facility], [address], [place], [place] [address]   Cholesterol 03/05/2023 193  0 - 200 mg/dL Final   Triglycerides 46/96/2952 178 (H)  <150 mg/dL Final   HDL 84/13/2440 52  >40 mg/dL Final   Total CHOL/HDL Ratio 03/05/2023 3.7  RATIO Final   VLDL 03/05/2023 36  0 - 40 mg/dL Final   LDL Cholesterol 03/05/2023 105 (H)  0 - 99 mg/dL Final   Comment:         Total Cholesterol/HDL:CHD Risk Coronary Heart Disease Risk Table                     Men   Women  1/2 Average Risk   3.4   3.3  Average Risk       5.0   4.4  2 X Average Risk   9.6   7.1  3 X Average Risk  23.4   11.0        Use the calculated Patient Ratio above and the CHD Risk Table to determine the patient's CHD Risk.        ATP III CLASSIFICATION (LDL):  <100     mg/dL   Optimal  102-725  mg/dL   Near or Above                    Optimal  130-159  mg/dL   Borderline  366-440  mg/dL   High  >347     mg/dL   Very High Performed at [facility], [address] [place], [place] [address]   Admission on 02/27/2023, Discharged on 02/27/2023  Component Date Value Ref Range Status   Sodium 02/27/2023 137  135 - 145 mmol/L Final   Potassium 02/27/2023 3.6  3.5 - 5.1 mmol/L Final   Chloride 02/27/2023 99  98 - 111 mmol/L Final   CO2 02/27/2023 25  22 - 32 mmol/L Final   Glucose, Bld 02/27/2023 106 (H)  70 - 99 mg/dL Final   Glucose reference range applies only to samples taken after fasting for at least 8 hours.   BUN 02/27/2023 11  6 - 20 mg/dL Final   Creatinine, Ser 02/27/2023 0.97  0.61 - 1.24 mg/dL Final   Calcium 63/87/5643 9.5  8.9 - 10.3 mg/dL Final   Total Protein 32/95/1884 7.6  6.5 - 8.1 g/dL Final   Albumin 16/60/6301 4.1  3.5 - 5.0 g/dL Final   AST 60/10/9321 27  15 - 41 U/L Final   ALT 02/27/2023 20  0 - 44 U/L Final   Alkaline Phosphatase 02/27/2023 59  38 - 126 U/L Final   Total Bilirubin 02/27/2023 1.0  0.0 - 1.2 mg/dL Final   GFR, Estimated 02/27/2023 >60  >60 mL/min Final   Comment: (NOTE) Calculated using the CKD-EPI  Creatinine Equation (2021)    Anion gap 02/27/2023 13  5 - 15 Final   Performed at [facility], [address] [place], [place] [address]   Alcohol , Ethyl (B) 02/27/2023 <10  <10 mg/dL Final   Comment: (NOTE) Lowest detectable limit for serum alcohol  is 10 mg/dL.  For medical purposes only. Performed at [facility], [address] [place], [place] [address]    Salicylate Lvl 02/27/2023 <7.0 (L)  7.0 - 30.0 mg/dL Final   Performed at [facility], [address] [place], [place] [address]   Acetaminophen  (Tylenol ), Serum 02/27/2023 <10 (L)  10 - 30 ug/mL Final   Comment: (NOTE) Therapeutic concentrations vary significantly. A range of 10-30 ug/mL  may be an effective concentration for many patients. However, some  are best treated at concentrations outside of this range. Acetaminophen  concentrations >150 ug/mL at 4 hours after ingestion  and >50 ug/mL at 12 hours after ingestion are often associated with  toxic reactions.  Performed at [facility], [address] [place], [place] [address]    WBC 02/27/2023 7.0  4.0 - 10.5 K/uL Final   RBC 02/27/2023 4.92  4.22 - 5.81 MIL/uL Final   Hemoglobin 02/27/2023 14.4  13.0 - 17.0 g/dL Final   HCT 62/95/2841 44.1  39.0 - 52.0 % Final   MCV 02/27/2023 89.6  80.0 - 100.0 fL Final   MCH 02/27/2023 29.3  26.0 - 34.0 pg Final   MCHC 02/27/2023 32.7  30.0 - 36.0 g/dL Final   RDW 32/44/0102 14.2  11.5 - 15.5 % Final   Platelets 02/27/2023 413 (H)  150 - 400 K/uL Final   nRBC 02/27/2023 0.0  0.0 - 0.2 % Final   Performed at [facility], [address] [place], [place] [address]   Troponin I (High Sensitivity) 02/27/2023 5  <18 ng/L Final   Comment: (NOTE) Elevated high sensitivity troponin I (hsTnI) values and significant  changes across serial measurements may suggest ACS but many other  chronic and acute conditions are known to elevate hsTnI results.  Refer to the "Links" section for chest pain algorithms and additional  guidance. Performed at [facility], [address] [place], [place] [address]    SARS Coronavirus 2 by RT PCR 02/27/2023 NEGATIVE  NEGATIVE Final   Comment: (NOTE) SARS-CoV-2 target nucleic acids are NOT DETECTED.  The SARS-CoV-2 RNA is generally detectable in upper  respiratory specimens during the acute phase of infection. The lowest concentration of SARS-CoV-2 viral copies this assay can detect is 138 copies/mL. A negative result does not preclude SARS-Cov-2 infection and should not be used as the sole basis for treatment or other patient management decisions. A negative result may occur with  improper specimen collection/handling, submission of specimen other than nasopharyngeal swab, presence of viral mutation(s) within the areas targeted by this assay, and inadequate number of viral copies(<138 copies/mL). A negative result must be combined with clinical observations, patient history, and epidemiological information. The expected result is Negative.  Fact Sheet for Patients:  BloggerCourse.com  Fact Sheet for Healthcare Providers:  SeriousBroker.it  This test is no                          t yet approved or cleared by the [place]  FDA and  has been authorized for detection and/or diagnosis of SARS-CoV-2 by FDA under an Emergency Use Authorization (EUA). This EUA will remain  in effect (  meaning this test can be used) for the duration of the COVID-19 declaration under Section 564(b)(1) of the Act, 21 U.S.C.section 360bbb-3(b)(1), unless the authorization is terminated  or revoked sooner.       Influenza A by PCR 02/27/2023 NEGATIVE  NEGATIVE Final   Influenza B by PCR 02/27/2023 NEGATIVE  NEGATIVE Final   Comment: (NOTE) The Xpert Xpress SARS-CoV-2/FLU/RSV plus assay is intended as an aid in the diagnosis of influenza from Nasopharyngeal swab specimens and should not be used as a sole basis for treatment. Nasal washings and aspirates are unacceptable for Xpert Xpress SARS-CoV-2/FLU/RSV testing.  Fact Sheet for Patients: BloggerCourse.com  Fact Sheet for Healthcare Providers: SeriousBroker.it  This test is not yet approved or  cleared by the [place]  FDA and has been authorized for detection and/or diagnosis of SARS-CoV-2 by FDA under an Emergency Use Authorization (EUA). This EUA will remain in effect (meaning this test can be used) for the duration of the COVID-19 declaration under Section 564(b)(1) of the Act, 21 U.S.C. section 360bbb-3(b)(1), unless the authorization is terminated or revoked.     Resp Syncytial Virus by PCR 02/27/2023 NEGATIVE  NEGATIVE Final   Comment: (NOTE) Fact Sheet for Patients: BloggerCourse.com  Fact Sheet for Healthcare Providers: SeriousBroker.it  This test is not yet approved or cleared by the [place]  FDA and has been authorized for detection and/or diagnosis of SARS-CoV-2 by FDA under an Emergency Use Authorization (EUA). This EUA will remain in effect (meaning this test can be used) for the duration of the COVID-19 declaration under Section 564(b)(1) of the Act, 21 U.S.C. section 360bbb-3(b)(1), unless the authorization is terminated or revoked.  Performed at [facility], [address] [place], [place] [address]    Troponin I (High Sensitivity) 02/27/2023 5  <18 ng/L Final   Comment: (NOTE) Elevated high sensitivity troponin I (hsTnI) values and significant  changes across serial measurements may suggest ACS but many other  chronic and acute conditions are known to elevate hsTnI results.  Refer to the "Links" section for chest pain algorithms and additional  guidance. Performed at [facility], [address] [place], [place] [address]    Color, Urine 02/27/2023 YELLOW (A)  YELLOW Final   APPearance 02/27/2023 HAZY (A)  CLEAR Final   Specific Gravity, Urine 02/27/2023 1.021  1.005 - 1.030 Final   pH 02/27/2023 8.0  5.0 - 8.0 Final   Glucose, UA 02/27/2023 150 (A)  NEGATIVE mg/dL Final   Hgb urine dipstick 02/27/2023 NEGATIVE  NEGATIVE Final   Bilirubin Urine 02/27/2023 NEGATIVE   NEGATIVE Final   Ketones, ur 02/27/2023 NEGATIVE  NEGATIVE mg/dL Final   Protein, ur 69/62/9528 NEGATIVE  NEGATIVE mg/dL Final   Nitrite 41/32/4401 NEGATIVE  NEGATIVE Final   Leukocytes,Ua 02/27/2023 NEGATIVE  NEGATIVE Final   RBC / HPF 02/27/2023 0-5  0 - 5 RBC/hpf Final   WBC, UA 02/27/2023 0-5  0 - 5 WBC/hpf Final   Bacteria, UA 02/27/2023 NONE SEEN  NONE SEEN Final   Squamous Epithelial / HPF 02/27/2023 0-5  0 - 5 /HPF Final   Mucus 02/27/2023 PRESENT   Final   Performed at [facility], [address] [place], [place] [address]   Specimen source GC/Chlam 02/27/2023 URINE, RANDOM   Final   Chlamydia Tr 02/27/2023 NOT DETECTED  NOT DETECTED Final   N gonorrhoeae 02/27/2023 NOT DETECTED  NOT DETECTED Final   Comment: (NOTE) This CT/NG assay has not been evaluated in patients with a history  of  hysterectomy. Performed at [facility], [address] [place], [place] [address]    Tricyclic, Ur Screen 02/27/2023 NONE DETECTED  NONE DETECTED Final   Amphetamines, Ur Screen 02/27/2023 NONE DETECTED  NONE DETECTED Final   MDMA (Ecstasy)Ur Screen 02/27/2023 NONE DETECTED  NONE DETECTED Final   Cocaine Metabolite,Ur [place] 02/27/2023 POSITIVE (A)  NONE DETECTED Final   Opiate, Ur Screen 02/27/2023 NONE DETECTED  NONE DETECTED Final   Phencyclidine (PCP) Ur S 02/27/2023 NONE DETECTED  NONE DETECTED Final   Cannabinoid 50 Ng, Ur [place] 02/27/2023 NONE DETECTED  NONE DETECTED Final   Barbiturates, Ur Screen 02/27/2023 NONE DETECTED  NONE DETECTED Final   Benzodiazepine, Ur Scrn 02/27/2023 POSITIVE (A)  NONE DETECTED Final   Methadone Scn, Ur 02/27/2023 NONE DETECTED  NONE DETECTED Final   Comment: (NOTE) Tricyclics + metabolites, urine    Cutoff 1000 ng/mL Amphetamines + metabolites, urine  Cutoff 1000 ng/mL MDMA (Ecstasy), urine              Cutoff 500 ng/mL Cocaine Metabolite, urine          Cutoff 300 ng/mL Opiate + metabolites, urine        Cutoff 300 ng/mL Phencyclidine  (PCP), urine         Cutoff 25 ng/mL Cannabinoid, urine                 Cutoff 50 ng/mL Barbiturates + metabolites, urine  Cutoff 200 ng/mL Benzodiazepine, urine              Cutoff 200 ng/mL Methadone, urine                   Cutoff 300 ng/mL  The urine drug screen provides only a preliminary, unconfirmed analytical test result and should not be used for non-medical purposes. Clinical consideration and professional judgment should be applied to any positive drug screen result due to possible interfering substances. A more specific alternate chemical method must be used in order to obtain a confirmed analytical result. Gas chromatography / mass spectrometry (GC/MS) is the preferred confirm                          atory method. Performed at [facility], [address] [place], [place] [address]   Admission on 02/18/2023, Discharged on 02/19/2023  Component Date Value Ref Range Status   Sodium 02/19/2023 134 (L)  135 - 145 mmol/L Final   Potassium 02/19/2023 4.3  3.5 - 5.1 mmol/L Final   Chloride 02/19/2023 100  98 - 111 mmol/L Final   CO2 02/19/2023 24  22 - 32 mmol/L Final   Glucose, Bld 02/19/2023 213 (H)  70 - 99 mg/dL Final   Glucose reference range applies only to samples taken after fasting for at least 8 hours.   BUN 02/19/2023 15  6 - 20 mg/dL Final   Creatinine, Ser 02/19/2023 0.95  0.61 - 1.24 mg/dL Final   Calcium 78/29/5621 9.5  8.9 - 10.3 mg/dL Final   Total Protein 30/86/5784 6.6  6.5 - 8.1 g/dL Final   Albumin 69/62/9528 3.4 (L)  3.5 - 5.0 g/dL Final   AST 41/32/4401 30  15 - 41 U/L Final   ALT 02/19/2023 19  0 - 44 U/L Final   Alkaline Phosphatase 02/19/2023 60  38 - 126 U/L Final   Total Bilirubin 02/19/2023 0.4  0.0 - 1.2 mg/dL Final   GFR, Estimated 02/19/2023 >60  >  60 mL/min Final   Comment: (NOTE) Calculated using the CKD-EPI Creatinine Equation (2021)    Anion gap 02/19/2023 10  5 - 15 Final   Performed at [facility], [address] [place], [place] [address]   Alcohol , Ethyl (B) 02/19/2023 <10  <10 mg/dL Final   Comment: (NOTE) Lowest detectable limit for serum alcohol  is 10 mg/dL.  For medical purposes only. Performed at [facility], [address], [place], [place] [address]    Salicylate Lvl 02/19/2023 <7.0 (L)  7.0 - 30.0 mg/dL Final   Performed at [facility], [address], [place], [place] [address]   Acetaminophen  (Tylenol ), Serum 02/19/2023 <10 (L)  10 - 30 ug/mL Final   Comment: (NOTE) Therapeutic concentrations vary significantly. A range of 10-30 ug/mL  may be an effective concentration for many patients. However, some  are best treated at concentrations outside of this range. Acetaminophen  concentrations >150 ug/mL at 4 hours after ingestion  and >50 ug/mL at 12 hours after ingestion are often associated with  toxic reactions.  Performed at [facility], [address] [place], [place] [address]    WBC 02/19/2023 4.5  4.0 - 10.5 K/uL Final   RBC 02/19/2023 4.73  4.22 - 5.81 MIL/uL Final   Hemoglobin 02/19/2023 13.8  13.0 - 17.0 g/dL Final   HCT 29/56/2130 41.8  39.0 - 52.0 % Final   MCV 02/19/2023 88.4  80.0 - 100.0 fL Final   MCH 02/19/2023 29.2  26.0 - 34.0 pg Final   MCHC 02/19/2023 33.0  30.0 - 36.0 g/dL Final   RDW 86/57/8469 13.6  11.5 - 15.5 % Final   Platelets 02/19/2023 373  150 - 400 K/uL Final   nRBC 02/19/2023 0.0  0.0 - 0.2 % Final   Performed at [facility], [address] [place], [place] [address]   Opiates 02/19/2023 NONE DETECTED  NONE DETECTED Final   Cocaine 02/19/2023 POSITIVE (A)  NONE DETECTED Final   Benzodiazepines 02/19/2023 NONE DETECTED  NONE DETECTED Final   Amphetamines 02/19/2023 NONE DETECTED  NONE DETECTED Final   Tetrahydrocannabinol 02/19/2023 NONE DETECTED  NONE DETECTED Final   Barbiturates 02/19/2023 NONE DETECTED  NONE DETECTED Final   Comment: (NOTE) DRUG SCREEN FOR MEDICAL PURPOSES ONLY.  IF CONFIRMATION IS NEEDED FOR  ANY PURPOSE, NOTIFY LAB WITHIN 5 DAYS.  LOWEST DETECTABLE LIMITS FOR URINE DRUG SCREEN Drug Class                     Cutoff (ng/mL) Amphetamine and metabolites    1000 Barbiturate and metabolites    200 Benzodiazepine                 200 Opiates and metabolites        300 Cocaine and metabolites        300 THC                            50 Performed at [facility], [address], [place], [place] [address]   Admission on 02/18/2023, Discharged on 02/18/2023  Component Date Value Ref Range Status   Sodium 02/18/2023 137  135 - 145 mmol/L Final   Potassium 02/18/2023 4.1  3.5 - 5.1 mmol/L Final   Chloride 02/18/2023 97 (L)  98 - 111 mmol/L Final   CO2 02/18/2023 25  22 - 32 mmol/L Final  Glucose, Bld 02/18/2023 125 (H)  70 - 99 mg/dL Final   Glucose reference range applies only to samples taken after fasting for at least 8 hours.   BUN 02/18/2023 11  6 - 20 mg/dL Final   Creatinine, Ser 02/18/2023 0.91  0.61 - 1.24 mg/dL Final   Calcium 09/81/1914 9.2  8.9 - 10.3 mg/dL Final   Total Protein 78/29/5621 6.9  6.5 - 8.1 g/dL Final   Albumin 30/86/5784 3.7  3.5 - 5.0 g/dL Final   AST 69/62/9528 36  15 - 41 U/L Final   ALT 02/18/2023 20  0 - 44 U/L Final   Alkaline Phosphatase 02/18/2023 52  38 - 126 U/L Final   Total Bilirubin 02/18/2023 0.7  0.0 - 1.2 mg/dL Final   GFR, Estimated 02/18/2023 >60  >60 mL/min Final   Comment: (NOTE) Calculated using the CKD-EPI Creatinine Equation (2021)    Anion gap 02/18/2023 15  5 - 15 Final   Performed at [facility], [address] [place], [place] [address]   Alcohol , Ethyl (B) 02/18/2023 16 (H)  <10 mg/dL Final   Comment: (NOTE) Lowest detectable limit for serum alcohol  is 10 mg/dL.  For medical purposes only. Performed at [facility], [address] [place], [place] [address]    WBC 02/18/2023 9.5  4.0 - 10.5 K/uL Final   RBC 02/18/2023 5.09  4.22 - 5.81 MIL/uL Final   Hemoglobin 02/18/2023 14.8  13.0 - 17.0  g/dL Final   HCT 72/53/6644 45.4  39.0 - 52.0 % Final   MCV 02/18/2023 89.2  80.0 - 100.0 fL Final   MCH 02/18/2023 29.1  26.0 - 34.0 pg Final   MCHC 02/18/2023 32.6  30.0 - 36.0 g/dL Final   RDW 03/47/4259 13.6  11.5 - 15.5 % Final   Platelets 02/18/2023 399  150 - 400 K/uL Final   nRBC 02/18/2023 0.0  0.0 - 0.2 % Final   Performed at [facility], [address], [place], [place] [address]   Opiates 02/18/2023 NONE DETECTED  NONE DETECTED Final   Cocaine 02/18/2023 POSITIVE (A)  NONE DETECTED Final   Benzodiazepines 02/18/2023 NONE DETECTED  NONE DETECTED Final   Amphetamines 02/18/2023 NONE DETECTED  NONE DETECTED Final   Tetrahydrocannabinol 02/18/2023 NONE DETECTED  NONE DETECTED Final   Barbiturates 02/18/2023 NONE DETECTED  NONE DETECTED Final   Comment: (NOTE) DRUG SCREEN FOR MEDICAL PURPOSES ONLY.  IF CONFIRMATION IS NEEDED FOR ANY PURPOSE, NOTIFY LAB WITHIN 5 DAYS.  LOWEST DETECTABLE LIMITS FOR URINE DRUG SCREEN Drug Class                     Cutoff (ng/mL) Amphetamine and metabolites    1000 Barbiturate and metabolites    200 Benzodiazepine                 200 Opiates and metabolites        300 Cocaine and metabolites        300 THC                            50 Performed at [facility], [address], [place], [place] [address]    SARS Coronavirus 2 by RT PCR 02/18/2023 NEGATIVE  NEGATIVE Final   Influenza A by PCR 02/18/2023 NEGATIVE  NEGATIVE Final   Influenza B by PCR 02/18/2023 NEGATIVE  NEGATIVE Final   Comment: (NOTE)  The Xpert Xpress SARS-CoV-2/FLU/RSV plus assay is intended as an aid in the diagnosis of influenza from Nasopharyngeal swab specimens and should not be used as a sole basis for treatment. Nasal washings and aspirates are unacceptable for Xpert Xpress SARS-CoV-2/FLU/RSV testing.  Fact Sheet for Patients: BloggerCourse.com  Fact Sheet for Healthcare  Providers: SeriousBroker.it  This test is not yet approved or cleared by the [place]  FDA and has been authorized for detection and/or diagnosis of SARS-CoV-2 by FDA under an Emergency Use Authorization (EUA). This EUA will remain in effect (meaning this test can be used) for the duration of the COVID-19 declaration under Section 564(b)(1) of the Act, 21 U.S.C. section 360bbb-3(b)(1), unless the authorization is terminated or revoked.     Resp Syncytial Virus by PCR 02/18/2023 NEGATIVE  NEGATIVE Final   Comment: (NOTE) Fact Sheet for Patients: BloggerCourse.com  Fact Sheet for Healthcare Providers: SeriousBroker.it  This test is not yet approved or cleared by the [place]  FDA and has been authorized for detection and/or diagnosis of SARS-CoV-2 by FDA under an Emergency Use Authorization (EUA). This EUA will remain in effect (meaning this test can be used) for the duration of the COVID-19 declaration under Section 564(b)(1) of the Act, 21 U.S.C. section 360bbb-3(b)(1), unless the authorization is terminated or revoked.  Performed at [facility], [address] [place], [place] [address]   There may be more visits with results that are not included.    Blood Alcohol  level:  Lab Results  Component Value Date   [facility] <15 06/07/2023   ETH <10 02/27/2023    Metabolic Disorder Labs: Lab Results  Component Value Date   HGBA1C 5.6 03/05/2023   MPG 114.02 03/05/2023   MPG 117 02/02/2009   No results found for: "PROLACTIN" Lab Results  Component Value Date   CHOL 184 06/11/2023   TRIG 98 06/11/2023   HDL 50 06/11/2023   CHOLHDL 3.7 06/11/2023   VLDL 20 06/11/2023   LDLCALC 114 (H) 06/11/2023   LDLCALC 105 (H) 03/05/2023    Therapeutic Lab Levels: No results found for: "LITHIUM" No results found for: "VALPROATE" No results found for: "CBMZ"  Physical Findings   AIMS     Flowsheet Row Admission (Discharged) from 02/02/2016 [facility] Admission (Discharged) from 03/22/2015 in [facility] Admission (Discharged) from 10/05/2014 in [facility] INPATIENT BEHAVIORAL MEDICINE Admission (Discharged) [facility]  AIMS Total Score 0 0 0 0      AUDIT    Flowsheet Row Admission (Discharged) from 02/27/2023 in [facility] BEHAVIORAL MEDICINE Admission (Discharged) from 11/17/2022 in [facility] INPATIENT BEHAVIORAL MEDICINE Admission (Discharged) from 10/27/2022 in [facility] INPATIENT BEHAVIORAL MEDICINE Admission (Discharged) from 03/22/2015 in [facility] Admission (Discharged) from 10/05/2014 in [facility] INPATIENT BEHAVIORAL MEDICINE  Alcohol  Use Disorder Identification Test Final Score (AUDIT) 22 14 14 25 18       PHQ2-9    Flowsheet Row ED from 06/08/2023 in [facility] ED from 12/03/2022 in [facility]  PHQ-2 Total Score 4 0  PHQ-9 Total Score 9 0      Flowsheet Row ED from 06/08/2023 in [facility] ED from 06/07/2023 in [facility] Emergency Department at [facility] ED to Hosp-Admission (Discharged) from 05/16/2023 in [facility] 1C MEDICAL TELEMETRY  C-SSRS RISK CATEGORY No Risk No Risk No Risk        Musculoskeletal  Strength &  Muscle Tone: within normal limits Gait & Station: normal Patient leans: N/A  Psychiatric Specialty Exam  Presentation  General Appearance:  Appropriate for Environment; Casual; Fairly Groomed  Eye Contact: Fair  Speech: Clear and Coherent; Normal Rate  Speech Volume: Normal  Handedness: Right   Mood and Affect  Mood: "Fine"  Affect: Appropriate; Congruent; Restricted   Thought Process  Thought Processes: Coherent; Goal Directed; Linear  Descriptions of Associations:Intact  Orientation:Full  (Time, Place and Person)  Thought Content:Rumination; Perseveration; WDL  Diagnosis of Schizophrenia or Schizoaffective disorder in past: No   Hallucinations: None  Ideas of Reference:None  Suicidal Thoughts: None  Homicidal Thoughts: None   Sensorium  Memory: Immediate Good  Judgment: Fair  Insight: Shallow   Executive Functions  Concentration: Good  Attention Span: Good  Recall: Good  Fund of Knowledge: Good  Language: Good   Psychomotor Activity  Psychomotor Activity: Normal   Assets  Assets: Communication Skills; Desire for Improvement; Resilience   Sleep  Sleep: Fair  Physical Exam  Physical Exam Vitals and nursing note reviewed.  Constitutional:      General: He is not in acute distress.    Appearance: Normal appearance. He is not ill-appearing, toxic-appearing or diaphoretic.  HENT:     Head: Normocephalic and atraumatic.     Nose: No congestion.  Eyes:     Conjunctiva/sclera: Conjunctivae normal.  Pulmonary:     Effort: Pulmonary effort is normal. No respiratory distress.  Neurological:     General: No focal deficit present.     Mental Status: He is alert and oriented to person, place, and time.     Gait: Gait normal.    Review of Systems  Constitutional:  Positive for malaise/fatigue.  HENT:  Negative for congestion.   Respiratory:  Negative for cough, shortness of breath and wheezing.   Cardiovascular:  Negative for chest pain and palpitations.  Gastrointestinal:  Negative for abdominal pain, nausea and vomiting.  Neurological:  Negative for dizziness, tremors and headaches.   Blood pressure (!) 141/78, pulse 99, temperature 98.7 F (37.1 C), temperature source Oral, resp. rate 16, SpO2 98%. There is no height or weight on file to calculate BMI.  Treatment Plan Summary: Daily contact with patient to assess and evaluate symptoms and progress in treatment and Medication management  - Please see the [facility] for detailed  medications. -Continue Abilify  10 mg daily for mood stabilization -cont Inderal  10 mg BID for hypertension/GAD -Continue Prozac  20 mg daily for depressive symptoms -Continue hydroxyzine  25 mg 3 times daily as needed for breakthrough anxiety -Continue trazodone  50 mg nightly as needed for sleep -cont agitation protocol medication as needed Zyprexa  5 mg IM or p.o. for agitation - Oral thiamine  and MVI replacement - Abstinence from substances encouraged - SW to look into options for outpatient SA treatment at discharge  -continue naltrexone 50 mg qPM -started vit d3 1000ui daily x90 days  Labs reviewed: Ordered hemoglobin A1c as last 1 was on 03/1 (pending).  B12 wnl. Vit d low, ~20  Lipid Panel     Component Value Date/Time   CHOL 184 06/11/2023 0520   TRIG 98 06/11/2023 0520   HDL 50 06/11/2023 0520   CHOLHDL 3.7 06/11/2023 0520   VLDL 20 06/11/2023 0520   LDLCALC 114 (H) 06/11/2023 0520    Norbert Bean, MD, PGY-2 06/13/2023 8:07 AM  This case was discussed with attending Dr. Genita Keys who agrees with the above formulated treatment plan. Please see attending attestation for additional details.  This note was created using a voice recognition software as a result there may be grammatical errors inadvertently enclosed that do not reflect the nature of this encounter. Every attempt is made to correct such errors.

## 2023-06-13 NOTE — ED Notes (Signed)
 Pt sleeping at present, no distress noted, monitoring for safety.

## 2023-06-13 NOTE — ED Notes (Signed)
 Pt was provided dinner.

## 2023-06-13 NOTE — Group Note (Signed)
 Group Topic: Social Support  Group Date: 06/13/2023 Start Time: 1744 End Time: 1754 Facilitators: Dontavia Brand, [place]oll, RN  Department: [facility]  Number of Participants: 1  Group Focus: check in Treatment Modality:  Individual Therapy Interventions utilized were support Purpose: express feelings  Name: Franklin [name] Date of Birth: 15-Mar-1970  MR: [id number]    Level of Participation: active Quality of Participation: attentive, cooperative, and engaged Interactions with others: gave feedback Mood/Affect: appropriate and positive Triggers (if applicable): None identified at this time Cognition: coherent/clear and logical Progress: Gaining insight Response: Patient has been interacting with peers and staff, bright disposition. Patient voices no complaints at this time, had reported some itching earlier in the day that has since resolves after PRN medication.  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

## 2023-06-13 NOTE — ED Notes (Signed)
 Pt was provided lunch

## 2023-06-13 NOTE — ED Notes (Signed)
 Patient alert & oriented x4. Denies intent to harm self or others when asked. Denies A/VH. Patient denies pain when asked, reports itchiness on arms, states he believes he got into some poison oak, provider Norbert Bean, Md made aware. No acute distress noted. Scheduled medications administered with no complications. Support and encouragement provided. Routine safety checks conducted per facility protocol. Encouraged patient to notify staff if any thoughts of harm towards self or others arise. Patient verbalizes understanding and agreement.

## 2023-06-14 DIAGNOSIS — Z5900 Homelessness unspecified: Secondary | ICD-10-CM | POA: Diagnosis not present

## 2023-06-14 DIAGNOSIS — F32A Depression, unspecified: Secondary | ICD-10-CM | POA: Diagnosis not present

## 2023-06-14 DIAGNOSIS — F1424 Cocaine dependence with cocaine-induced mood disorder: Secondary | ICD-10-CM | POA: Diagnosis not present

## 2023-06-14 DIAGNOSIS — F17210 Nicotine dependence, cigarettes, uncomplicated: Secondary | ICD-10-CM | POA: Diagnosis not present

## 2023-06-14 MED ORDER — VITAMIN D3 25 MCG PO TABS: 1000.0000 [IU] | ORAL_TABLET | Freq: Every day | ORAL | 0 refills | Status: AC

## 2023-06-14 MED ORDER — NALTREXONE HCL 50 MG PO TABS: 50.0000 mg | ORAL_TABLET | Freq: Every day | ORAL | 0 refills | Status: DC

## 2023-06-14 MED ORDER — ADULT MULTIVITAMIN W/MINERALS CH
1.0000 | ORAL_TABLET | Freq: Every day | ORAL | Status: AC
Start: 2023-06-15 — End: ?

## 2023-06-14 MED ORDER — FLUTICASONE FUROATE-VILANTEROL 100-25 MCG/ACT IN AEPB: 1.0000 | INHALATION_SPRAY | Freq: Every day | RESPIRATORY_TRACT | 0 refills | Status: DC

## 2023-06-14 MED ORDER — PROPRANOLOL HCL 10 MG PO TABS: 10.0000 mg | ORAL_TABLET | Freq: Two times a day (BID) | ORAL | 0 refills | Status: DC

## 2023-06-14 MED ORDER — TRAZODONE HCL 50 MG PO TABS: 50.0000 mg | ORAL_TABLET | Freq: Every evening | ORAL | 0 refills | Status: DC | PRN

## 2023-06-14 MED ORDER — ADULT MULTIVITAMIN W/MINERALS CH: 1.0000 | ORAL_TABLET | Freq: Every day | ORAL | Status: DC

## 2023-06-14 MED ORDER — ARIPIPRAZOLE 10 MG PO TABS: 10.0000 mg | ORAL_TABLET | Freq: Every day | ORAL | 0 refills | Status: DC

## 2023-06-14 MED ORDER — VITAMIN B-1 100 MG PO TABS: 100.0000 mg | ORAL_TABLET | Freq: Every day | ORAL | Status: AC

## 2023-06-14 MED ORDER — VITAMIN D3 25 MCG PO TABS: 1000.0000 [IU] | ORAL_TABLET | Freq: Every day | ORAL | 0 refills | Status: DC

## 2023-06-14 MED ORDER — FLUOXETINE HCL 20 MG PO CAPS: 20.0000 mg | ORAL_CAPSULE | Freq: Every day | ORAL | 0 refills | Status: DC

## 2023-06-14 MED ORDER — VITAMIN B-1 100 MG PO TABS: 100.0000 mg | ORAL_TABLET | Freq: Every day | ORAL | Status: DC

## 2023-06-14 MED ORDER — GABAPENTIN 300 MG PO CAPS: 300.0000 mg | ORAL_CAPSULE | Freq: Three times a day (TID) | ORAL | 0 refills | Status: DC

## 2023-06-14 NOTE — Discharge Instructions (Addendum)
 -Follow-up with your outpatient psychiatric provider -instructions on appointment date, time, and address (location) are provided to you in discharge paperwork.  -Take your psychiatric medications as prescribed at discharge - instructions are provided to you in the discharge paperwork  -Follow-up with outpatient primary care doctor and other specialists -for management of preventative medicine and any chronic medical disease.  -Recommend abstinence from alcohol , tobacco, and other illicit drug use at discharge.   -If your psychiatric symptoms recur, worsen, or if you have side effects to your psychiatric medications, call your outpatient psychiatric provider, 911, [phone] or go to the nearest emergency department.  -If suicidal thoughts occur, call your outpatient psychiatric provider, 911, [phone] or go to the nearest emergency department.  Naloxone (Narcan) can help reverse an overdose when given to the victim quickly.  [facility] offers free naloxone kits and instructions/training on its use.  Add naloxone to your first aid kit and you can help save a life.   Pick up your free kit at the following locations:   [place]:  [facility], [address] [place] [address] [phone]) [facility] [address] [place] [address] [phone]) [facility] [address] [place] [address]  [place]: [facility] [address] [place] [address] ([phone]) [facility] [address] [place] [address] [phone])   AA and NA Meetings in [place], [place]  Find AA meetings in [place], [place]  to help you on your road to recovery. Our comprehensive directory of AA meetings in [place], [place]  includes open, closed, speaker, and other specialized meetings, all of which are designed to help  you get sober and remain sober. AA NA Time Meeting Name Group Name Address Meeting Type 7:00 AM EST Sober at [employer] and [employer] Sober at [employer] and F2F [address] [place], [place], [address] Open Meeting 7:30 AM EST Sober at [employer] at [employer] [address], [place], [place], [address] Open Meeting 10:00 AM EST [place], [place], [address] Open Meeting 12:00 PM EST [place], [place], [address] Open Meeting 12:00 PM EST [address] Group [address] Group [address], [place], [place], [address] Open Meeting 5:30 PM EST [place]  Street [facility] [place]  [address], [place], [place], [address] Open Meeting 5:30 PM EST 5 30 Group [place] 5 30 Group [place] [address], [place], [place], [address] Open Meeting 7:00 PM EST Living in the Solution [facility] in the Solution [facility] [address] [place], [place], [address] Closed Meeting 7:00 PM EST 5 30 Group [address] 5 30 Group [address] [place], [place], [address] Open Meeting 7:00 PM EST [facility] Promises Group [facility] Promises Group [address], [place], [place], [address] Open Meeting 7:30 PM EST [facility] [employer] [address], [place], [place], [address] Open Meeting 11:59 PM EST [address] [place]  [address], [place], [place], [address] Open Meeting  Find The Best Local Rehab Centers In [place]  [place] 2025  Mental Health [facility] [address] [place], [place] , [address] Best 100 rehab in [place]  Inpatient Rehab Grants and Private Insurance accepted 6.68 Alcohol  Rehabilitation8.00 Treatment Options6.73 Drug Rehab and Detox6.00 Insurance and Payments6.00 ON THIS PAGE Speak to addiction  Specialist 100% Free and Confidential [phone] NA Time Meeting Name Group Name Address Meeting Type 7:00 AM EST Sober at [employer] and [employer] Sober at [employer] and [employer] [address], [place], [place], [address] Open Meeting 7:30 AM EST Sober at [employer] at [employer] [address], [place], [place], [address] Open Meeting 10:00 AM EST [place], [place], [address] Open Meeting 12:00 PM EST [place], [place], [address] Open Meeting 12:00 PM EST [address] Group [address] Group [address] [place], [place], [address] Open Meeting 5:30 PM EST [place]  [address] [place]  [address] [place] [address], [place], [place], [address] Open Meeting 5:30 PM EST 5 30 Group [place] 5 30 Group [place] [address], [place], [place], [address] Open Meeting 7:00 PM EST Living in the Solution [facility] in the Solution [facility] [address], [place], [place], [address] Closed Meeting 7:00 PM EST 5 30 Group [address] 5 30 [address] [place], [place], [address] Open Meeting 7:00 PM EST [facility] Promises Group [facility] Promises Group [address] [place], [place], [address] Open Meeting 7:30 PM EST [facility] [employer] [address] [place], [place], [address] Open Meeting 11:59 PM EST [place]  [address] [place]  [address] [place] [address] [place], [place], [address] Open Meeting

## 2023-06-14 NOTE — ED Notes (Signed)
 Pt in bed w/eyes closed resting quietly. Respirations equal and unlabored. No signs or symptoms of distress. Routine safety checks maintained/ongoing.

## 2023-06-14 NOTE — Discharge Planning (Signed)
 [place] to meet his sister and return to her home in [place], [place] where he will resume his outpatient SA treatment through AA/NA meetings as he had reported to do successfully in the past. No other concerns noted or reported. RN was provided a cab voucher for transport of patient.

## 2023-06-14 NOTE — ED Notes (Signed)
 Pt reports overall improvement since admission, says he is ready to discharge. Pt denies si hi and avh. Pt ate breakfast, denies physical complaints or discomforts.

## 2023-06-14 NOTE — ED Provider Notes (Signed)
 FBC/OBS ASAP Discharge Summary  Date and Time: 06/14/2023 9:21 AM  Name: Franklin [name]  MRN:  [id number]   Discharge Diagnoses:  Final diagnoses:  Cocaine use disorder, severe, dependence (HCC)  Substance induced mood disorder (HCC)  Tobacco use disorder  Chronic obstructive pulmonary disease, unspecified COPD type (HCC)  Generalized anxiety disorder  Alcohol  use disorder, severe, dependence (HCC)  Vitamin D  insufficiency   Subjective:  Patient is seen lying in his bed today. He reports that he talked with his sister yesterday and she is coming over to their aunt's house today and he requests discharge to his aunt's place today. He reports euthymic mood, reports feeling less depressed compared to prior to admission. He reports plan to go back with his sister to [place]  where he previously maintained sobriety and to get back connected with AA and NA meetings. He is future oriented. He denies SI/HI/AVH. He reports that his admission was due to acting erratically due to his substance use. He does not appear to be responding to internal stimuli. Discussed that he could also continue to contact rehabs while he is down in [place]  and he is open to this. Reports he had difficulty in the past due to his court dates. While it was encouraged to patient to allow providers to contact his sister for discharge planning, he declined.   Stay Summary:  During the patient's hospitalization, patient had extensive initial psychiatric evaluation, and follow-up psychiatric evaluations every day.  Psychiatric diagnoses provided upon initial assessment:  -Cocaine use disorder -Alcohol  use disorder  -MDD -GAD   Patient's psychiatric medications were adjusted on admission:  --Started on abilify  10mg  for mood stability --Started on prozac  10mg  for depression --Started on ativan  withdrawal protocol for alcohol  use   During the hospitalization, other adjustments were made to the patient's psychiatric  medication regimen:  --increased prozac  20mg  for depression --started gabapentin  300 TID for anxiety and alcohol  use disorder --completed ativan  taper --started naltrexone 50mg  for alcohol  use disorder --started propranolol  10mg  BID for anxiety and hypertension  Patient's care was discussed during the interdisciplinary team meeting every day during the hospitalization.  The patient denied having side effects to prescribed psychiatric medication.  Gradually, patient started adjusting to milieu. The patient was evaluated each day by a clinical provider to ascertain response to treatment. Improvement was noted by the patient's report of decreasing symptoms, improved sleep and appetite, affect, medication tolerance, behavior, and participation in unit programming.  Patient was asked each day to complete a self inventory noting mood, mental status, pain, new symptoms, anxiety and concerns.    Symptoms were reported as significantly decreased or resolved completely by discharge.   On day of discharge, the patient reports that their mood is stable. The patient denied having suicidal thoughts for more than 48 hours prior to discharge.  Patient denies having homicidal thoughts.  Patient denies having auditory hallucinations.  Patient denies any visual hallucinations or other symptoms of psychosis. The patient was motivated to continue taking medication with a goal of continued improvement in mental health.   The patient reports their target psychiatric symptoms of depression, substance use cravings responded well to the psychiatric medications, and the patient reports overall benefit other psychiatric hospitalization. Supportive psychotherapy was provided to the patient. The patient also participated in regular group therapy while hospitalized. Coping skills, problem solving as well as relaxation therapies were also part of the unit programming.  Labs were reviewed with the patient, and abnormal results  were discussed with  the patient.  The patient is able to verbalize their individual safety plan to this provider.  # It is recommended to the patient to continue psychiatric medications as prescribed, after discharge from the hospital.    # It is recommended to the patient to follow up with your outpatient psychiatric provider and PCP.  # It was discussed with the patient, the impact of alcohol , drugs, tobacco have been there overall psychiatric and medical wellbeing, and total abstinence from substance use was recommended the patient.ed.  # Prescriptions provided or sent directly to preferred pharmacy at discharge. Patient agreeable to plan. Given opportunity to ask questions. Appears to feel comfortable with discharge.    # In the event of worsening symptoms, the patient is instructed to call the crisis hotline, 911 and or go to the nearest ED for appropriate evaluation and treatment of symptoms. To follow-up with primary care provider for other medical issues, concerns and or health care needs  # Patient was discharged to aunt's home with a plan to follow up as noted below.   Past Psychiatric History:  Prev Dx/Sx: MDD Current Psych Provider: Denies Home Meds (current): Prozac  20 mg Previous Med Trials: Abilify  5 mg, Risperdal  1 mg, trazodone  50 mg, amitriptyline 25 mg Therapy: Yes   Prior Psych Hospitalization: Yes, more than 20 in his lifetime. 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, dispatching trucks, no income coming not on disability Legal Hx: court date on March 24, 2023 for grand larceny Living  Situation: Homeless, single, no children, no family members involved in his life Spiritual Hx: Christianity Access to weapons/lethal means: no   Social History   Tobacco Use   Smoking status: Some Days    Current packs/day: 0.50    Types: Cigarettes   Smokeless tobacco: Never  Vaping Use   Vaping status: Never Used  Substance Use Topics   Alcohol  use: Yes    Alcohol /week: 12.0 standard drinks of alcohol     Types: 12 Cans of beer per week   Drug use: Yes    Types: "Crack" cocaine, Cocaine    Comment: one gram daily    Tobacco Cessation:  A prescription for an FDA-approved tobacco cessation medication was offered at discharge and the patient refused  Current Medications:  Current Facility-Administered Medications  Medication Dose Route Frequency Provider Last Rate Last Admin   ARIPiprazole  (ABILIFY ) tablet 10 mg  10 mg Oral Daily McLauchlin, Angela, NP   10 mg at 06/14/23 0910   cholecalciferol (VITAMIN D3) 25 MCG (1000 UNIT) tablet 1,000 Units  1,000 Units Oral Q breakfast Nguyen, Julie, DO   1,000 Units at 06/14/23 1914   FLUoxetine  (PROZAC ) capsule 20 mg  20 mg Oral Daily Lissa Riding, NP   20 mg at 06/14/23 0910   fluticasone  furoate-vilanterol (BREO ELLIPTA ) 200-25 MCG/ACT 1 puff  1 puff Inhalation q AM Nguyen, Julie, DO   1 puff at 06/14/23 0910   gabapentin  (NEURONTIN ) capsule 300 mg  300 mg Oral TID Lissa Riding, NP   300 mg at 06/14/23 0911   hydrocortisone 1 % ointment   Topical Daily PRN Norbert Bean,  MD   1 Application at 06/14/23 0914   ibuprofen  (ADVIL ) tablet 600 mg  600 mg Oral Q6H PRN Lord, Jamison Y, NP       multivitamin with minerals tablet 1 tablet  1 tablet Oral Daily McLauchlin, Angela, NP   1 tablet at 06/14/23 0911   naltrexone (DEPADE) tablet 50 mg  50 mg Oral QHS Nguyen, Julie, DO   50 mg at 06/13/23 2100   propranolol  (INDERAL ) tablet 10 mg  10 mg Oral BID Nkwenti, Doris, NP   10 mg at 06/14/23 1610   thiamine  (VITAMIN B1) tablet 100 mg  100 mg  Oral Daily McLauchlin, Angela, NP   100 mg at 06/14/23 0912   traZODone  (DESYREL ) tablet 50 mg  50 mg Oral QHS PRN Lord, Jamison Y, NP   50 mg at 06/13/23 2100   Current Outpatient Medications  Medication Sig Dispense Refill   albuterol  (VENTOLIN  HFA) 108 (90 Base) MCG/ACT inhaler Inhale 2 puffs into the lungs every 6 (six) hours as needed for wheezing or shortness of breath. 8 g 0   ipratropium-albuterol  (DUONEB) 0.5-2.5 (3) MG/3ML SOLN Take 3 mLs by nebulization every 6 (six) hours as needed. (Patient taking differently: Take 3 mLs by nebulization every 6 (six) hours as needed (For shortness of breath).) 360 mL 0   mometasone -formoterol  (DULERA ) 200-5 MCG/ACT AERO Inhale 2 puffs into the lungs 2 (two) times daily in the am and at bedtime.Cecily Cohen ON 06/15/2023] ARIPiprazole  (ABILIFY ) 10 MG tablet Take 1 tablet (10 mg total) by mouth daily. 30 tablet 0   [START ON 06/15/2023] cholecalciferol (CHOLECALCIFEROL) 25 MCG tablet Take 1 tablet (1,000 Units total) by mouth daily with breakfast. 30 tablet 0   [START ON 06/15/2023] FLUoxetine  (PROZAC ) 20 MG capsule Take 1 capsule (20 mg total) by mouth daily. 30 capsule 0   fluticasone  furoate-vilanterol (BREO ELLIPTA ) 100-25 MCG/ACT AEPB Inhale 1 puff into the lungs daily. 30 each 0   gabapentin  (NEURONTIN ) 300 MG capsule Take 1 capsule (300 mg total) by mouth 3 (three) times daily. 90 capsule 0   [START ON 06/15/2023] Multiple Vitamin (MULTIVITAMIN WITH MINERALS) TABS tablet Take 1 tablet by mouth daily.     naltrexone (DEPADE) 50 MG tablet Take 1 tablet (50 mg total) by mouth at bedtime. 30 tablet 0   propranolol  (INDERAL ) 10 MG tablet Take 1 tablet (10 mg total) by mouth 2 (two) times daily. 60 tablet 0   [START ON 06/15/2023] thiamine  (VITAMIN B-1) 100 MG tablet Take 1 tablet (100 mg total) by mouth daily.     traZODone  (DESYREL ) 50 MG tablet Take 1 tablet (50 mg total) by mouth at bedtime as needed for sleep. 30 tablet 0    PTA Medications:   Facility Ordered Medications  Medication   ARIPiprazole  (ABILIFY ) tablet 10 mg   [COMPLETED] thiamine  (VITAMIN B1) injection 100 mg   thiamine  (VITAMIN B1) tablet 100 mg   multivitamin with minerals tablet 1 tablet   [EXPIRED] hydrOXYzine  (ATARAX ) tablet 25 mg   [EXPIRED] loperamide  (IMODIUM ) capsule 2-4 mg   [EXPIRED] ondansetron  (ZOFRAN -ODT) disintegrating tablet 4 mg   traZODone  (DESYREL ) tablet 50 mg   ibuprofen  (ADVIL ) tablet 600 mg   gabapentin  (NEURONTIN ) capsule 300 mg   FLUoxetine  (PROZAC ) capsule 20 mg   propranolol  (INDERAL ) tablet 10 mg   [COMPLETED] naltrexone (DEPADE) tablet 25 mg   Followed by   naltrexone (DEPADE) tablet 50 mg   fluticasone  furoate-vilanterol (BREO ELLIPTA ) 200-25 MCG/ACT 1 puff  cholecalciferol (VITAMIN D3) 25 MCG (1000 UNIT) tablet 1,000 Units   hydrocortisone 1 % ointment   PTA Medications  Medication Sig   albuterol  (VENTOLIN  HFA) 108 (90 Base) MCG/ACT inhaler Inhale 2 puffs into the lungs every 6 (six) hours as needed for wheezing or shortness of breath.   ipratropium-albuterol  (DUONEB) 0.5-2.5 (3) MG/3ML SOLN Take 3 mLs by nebulization every 6 (six) hours as needed. (Patient taking differently: Take 3 mLs by nebulization every 6 (six) hours as needed (For shortness of breath).)   mometasone -formoterol  (DULERA ) 200-5 MCG/ACT AERO Inhale 2 puffs into the lungs 2 (two) times daily in the am and at bedtime..   propranolol  (INDERAL ) 10 MG tablet Take 1 tablet (10 mg total) by mouth 2 (two) times daily.   [START ON 06/15/2023] ARIPiprazole  (ABILIFY ) 10 MG tablet Take 1 tablet (10 mg total) by mouth daily.   [START ON 06/15/2023] FLUoxetine  (PROZAC ) 20 MG capsule Take 1 capsule (20 mg total) by mouth daily.   traZODone  (DESYREL ) 50 MG tablet Take 1 tablet (50 mg total) by mouth at bedtime as needed for sleep.   naltrexone (DEPADE) 50 MG tablet Take 1 tablet (50 mg total) by mouth at bedtime.   gabapentin  (NEURONTIN ) 300 MG capsule Take 1 capsule (300 mg  total) by mouth 3 (three) times daily.   [START ON 06/15/2023] cholecalciferol (CHOLECALCIFEROL) 25 MCG tablet Take 1 tablet (1,000 Units total) by mouth daily with breakfast.   [START ON 06/15/2023] Multiple Vitamin (MULTIVITAMIN WITH MINERALS) TABS tablet Take 1 tablet by mouth daily.   [START ON 06/15/2023] thiamine  (VITAMIN B-1) 100 MG tablet Take 1 tablet (100 mg total) by mouth daily.   fluticasone  furoate-vilanterol (BREO ELLIPTA ) 100-25 MCG/ACT AEPB Inhale 1 puff into the lungs daily.       06/13/2023    1:31 PM 06/08/2023    4:37 PM 12/09/2022    8:03 AM  Depression screen PHQ 2/9  Decreased Interest 2 2 0  Down, Depressed, Hopeless 2 2 0  PHQ - 2 Score 4 4 0  Altered sleeping 2 2 0  Tired, decreased energy 2 2 0  Change in appetite 0 0 0  Feeling bad or failure about yourself  1 1 0  Trouble concentrating 0 0 0  Moving slowly or fidgety/restless 0 0   Suicidal thoughts 0 0 0  PHQ-9 Score 9 9 0  Difficult doing work/chores  Somewhat difficult     Flowsheet Row ED from 06/08/2023 in [facility] ED from 06/07/2023 in [facility] Department at [facility] ED to Hosp-Admission (Discharged) from 05/16/2023 in [facility] 1C MEDICAL TELEMETRY  C-SSRS RISK CATEGORY No Risk No Risk No Risk       Musculoskeletal  Strength & Muscle Tone: within normal limits Gait & Station: normal Patient leans: N/A  Psychiatric Specialty Exam  Presentation  General Appearance:  Appropriate for Environment; Casual; Fairly Groomed  Eye Contact: Fair  Speech: Clear and Coherent; Normal Rate  Speech Volume: Normal  Handedness: Right   Mood and Affect  Mood: "Pretty good"  Affect: Appropriate, Congruent   Thought Process  Thought Processes: Coherent; Goal Directed; Linear  Descriptions of Associations:Intact  Orientation:Full (Time, Place and Person)  Thought Content:Rumination; Perseveration; WDL  Diagnosis of  Schizophrenia or Schizoaffective disorder in past: No  Duration of Psychotic Symptoms: None  Hallucinations:None Ideas of Reference:None  Suicidal Thoughts: None Homicidal Thoughts: None  Sensorium  Memory: Immediate Good  Judgment: Fair  Insight:  Shallow   Executive Functions  Concentration: Good  Attention Span: Good  Recall: Good  Fund of Knowledge: Good  Language: Good   Psychomotor Activity  Psychomotor Activity: Normal  Assets  Assets: Communication Skills; Desire for Improvement; Resilience   Sleep  Sleep: Fair   Physical Exam  Physical Exam ROS Physical Exam Constitutional:      Appearance: the patient is not toxic-appearing.  Pulmonary:     Effort: Pulmonary effort is normal.  Neurological:     General: No focal deficit present.     Mental Status: the patient is alert and oriented to person, place, and time.   Review of Systems  Respiratory:  Negative for shortness of breath.   Cardiovascular:  Negative for chest pain.  Gastrointestinal:  Negative for abdominal pain, constipation, diarrhea, nausea and vomiting.  Neurological:  Negative for headaches.   Blood pressure (!) 141/97, pulse 77, temperature 98.6 F (37 C), temperature source Oral, resp. rate 15, SpO2 100%. There is no height or weight on file to calculate BMI.  Demographic Factors:  Male, Low socioeconomic status, and Unemployed  Loss Factors: Legal issues  Historical Factors: Prior suicide attempts and Impulsivity  Risk Reduction Factors:   Living with another person, especially a relative  Continued Clinical Symptoms:  Alcohol /Substance Abuse/Dependencies Previous Psychiatric Diagnoses and Treatments  Cognitive Features That Contribute To Risk:  Thought constriction (tunnel vision)    Suicide Risk:  Mild:  There are no identifiable plans, no associated intent, mild dysphoria and related symptoms, good self-control (both objective and subjective assessment),  few other risk factors, and identifiable protective factors, including available and accessible social support.  Plan Of Care/Follow-up recommendations:  Activity: as tolerated  Diet: heart healthy  Other: -Follow-up with your outpatient psychiatric provider -instructions on appointment date, time, and address (location) are provided to you in discharge paperwork.  -Take your psychiatric medications as prescribed at discharge - instructions are provided to you in the discharge paperwork  -Follow-up with outpatient primary care doctor and other specialists -for management of preventative medicine and chronic medical disease: -HTN -COPD  -Testing: Follow-up with outpatient provider for abnormal lab results:  -A1c 6.3 -LDL 114 -Vit D 27.83 -UDS+cocaine -Hgb 11.8  -If you are prescribed an atypical antipsychotic medication, we recommend that your outpatient psychiatrist follow routine screening for side effects within 3 months of discharge, including monitoring: AIMS scale, height, weight, blood pressure, fasting lipid panel, HbA1c, and fasting blood sugar.   -Recommend total abstinence from alcohol , tobacco, and other illicit drug use at discharge.   -If your psychiatric symptoms recur, worsen, or if you have side effects to your psychiatric medications, call your outpatient psychiatric provider, 911, [phone] or go to the nearest emergency department.  -If suicidal thoughts occur, immediately call your outpatient psychiatric provider, 911, [phone] or go to the nearest emergency department.   Disposition: Home to aunt's house   Norbert Bean, MD, PGY-2 06/14/2023, 9:21 AM  This case was discussed with attending Dr. Genita Keys who agrees with the above formulated treatment plan. Please see attending attestation for additional details.   This note was created using a voice recognition software as a result there may be grammatical errors inadvertently enclosed that do not reflect the nature of this  encounter. Every attempt is made to correct such errors.

## 2023-06-14 NOTE — Group Note (Signed)
 Group Topic: Social Support  Group Date: 06/14/2023 Start Time: 0730 End Time: 0800 Facilitators: Savien Mamula , Lucian Rust, NT  Department: [facility]  Number of Participants: 8  Group Focus: acceptance, daily focus, and goals/reality orientation Treatment Modality:  Cognitive Behavioral Therapy Interventions utilized were exploration, story telling, and support Purpose: express feelings, increase insight, and reinforce self-care  Name: Franklin [name] Date of Birth: Mar 07, 1970  MR: [id number]    Level of Participation: active Quality of Participation: attentive and cooperative Interactions with others: gave feedback Mood/Affect: appropriate Triggers (if applicable): N/A Cognition: coherent/clear and goal directed Progress: Gaining insight Response: "I plan on getting some long term help with my habits, they have set me back for years but I think it is time to take back control of my life and path" Plan: referral / recommendations  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) 05/05/2023   Malingering 02/27/2023   MDD (major depressive disorder) 02/27/2023   Cocaine abuse with cocaine-induced mood disorder (HCC) 12/03/2022   Major depressive disorder, 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

## 2023-06-14 NOTE — ED Notes (Signed)
 Discharge paperwork discussed with pt including AVS, follow up plan, and medications, pt denied questions. Pt escorted off unit to taxi, all items returned.

## 2023-06-21 ENCOUNTER — Emergency Department (HOSPITAL_COMMUNITY)
Admission: EM | Admit: 2023-06-21 | Discharge: 2023-06-22 | Disposition: A | Discharge status: Home / Self Care | Payer: MEDICAID | Attending: Emergency Medicine | Admitting: Emergency Medicine

## 2023-06-21 ENCOUNTER — Other Ambulatory Visit: Payer: Self-pay

## 2023-06-21 DIAGNOSIS — F329 Major depressive disorder, single episode, unspecified: Secondary | ICD-10-CM | POA: Diagnosis not present

## 2023-06-21 DIAGNOSIS — Z5900 Homelessness unspecified: Secondary | ICD-10-CM | POA: Diagnosis not present

## 2023-06-21 DIAGNOSIS — J441 Chronic obstructive pulmonary disease with (acute) exacerbation: Secondary | ICD-10-CM | POA: Diagnosis not present

## 2023-06-21 DIAGNOSIS — F419 Anxiety disorder, unspecified: Secondary | ICD-10-CM | POA: Diagnosis not present

## 2023-06-21 DIAGNOSIS — Z765 Malingerer [conscious simulation]: Secondary | ICD-10-CM | POA: Diagnosis not present

## 2023-06-21 DIAGNOSIS — F4310 Post-traumatic stress disorder, unspecified: Secondary | ICD-10-CM | POA: Diagnosis not present

## 2023-06-21 DIAGNOSIS — F1090 Alcohol use, unspecified, uncomplicated: Secondary | ICD-10-CM | POA: Diagnosis not present

## 2023-06-21 DIAGNOSIS — Z91148 Patient's other noncompliance with medication regimen for other reason: Secondary | ICD-10-CM | POA: Diagnosis not present

## 2023-06-21 DIAGNOSIS — F17210 Nicotine dependence, cigarettes, uncomplicated: Secondary | ICD-10-CM | POA: Insufficient documentation

## 2023-06-21 DIAGNOSIS — J449 Chronic obstructive pulmonary disease, unspecified: Secondary | ICD-10-CM | POA: Diagnosis not present

## 2023-06-21 DIAGNOSIS — R0602 Shortness of breath: Secondary | ICD-10-CM | POA: Diagnosis present

## 2023-06-21 DIAGNOSIS — F1910 Other psychoactive substance abuse, uncomplicated: Secondary | ICD-10-CM | POA: Diagnosis present

## 2023-06-21 DIAGNOSIS — F1490 Cocaine use, unspecified, uncomplicated: Secondary | ICD-10-CM | POA: Diagnosis not present

## 2023-06-21 MED ORDER — ALBUTEROL SULFATE HFA 108 (90 BASE) MCG/ACT IN AERS
8.0000 | INHALATION_SPRAY | Freq: Once | RESPIRATORY_TRACT | Status: AC
  Administered 2023-06-21: 8 via RESPIRATORY_TRACT
  Filled 2023-06-21: qty 6.7

## 2023-06-21 MED ORDER — PREDNISONE 20 MG PO TABS: ORAL_TABLET | ORAL | 0 refills | Status: DC

## 2023-06-21 MED ORDER — AEROCHAMBER PLUS FLO-VU LARGE MISC: 1.0000 | Freq: Once | Status: DC

## 2023-06-21 NOTE — Discharge Instructions (Signed)
 Use your inhaler every 4 hours(6 puffs) while awake, return for sudden worsening shortness of breath, or if you need to use your inhaler more often.   If you want to stop drinking and using illegal drugs then please go to the behavioral health urgent care center now.  I have also given you a list of resources in case you would prefer to go somewhere else tomorrow.

## 2023-06-21 NOTE — ED Provider Notes (Addendum)
 [place] EMERGENCY DEPARTMENT AT [facility] Provider Note   CSN: [id number] Arrival date & time: 06/21/23  2317     Patient presents with: No chief complaint on file.   Franklin [name] is a 53 y.o. male.   53 yo M with a chief complaints of difficulty breathing.  Going on for a couple days.  Feels like his prior COPD exacerbations.  He has no medication for this at home.  He denies fevers.  No known sick contacts.  He was given medication with EMS and feels a bit better now.        Prior to Admission medications   Medication Sig Start Date End Date Taking? Authorizing Provider  predniSONE  (DELTASONE ) 20 MG tablet 2 tabs po daily x 4 days 06/21/23  Yes Albertus Hughs, DO  albuterol  (VENTOLIN  HFA) 108 (90 Base) MCG/ACT inhaler Inhale 2 puffs into the lungs every 6 (six) hours as needed for wheezing or shortness of breath. 05/17/23   Sheril Dines, MD  ARIPiprazole  (ABILIFY ) 10 MG tablet Take 1 tablet (10 mg total) by mouth daily. 06/15/23 07/15/23  Chien, Stephanie, MD  FLUoxetine  (PROZAC ) 20 MG capsule Take 1 capsule (20 mg total) by mouth daily. 06/15/23 07/15/23  Chien, Stephanie, MD  fluticasone  furoate-vilanterol (BREO ELLIPTA ) 100-25 MCG/ACT AEPB Inhale 1 puff into the lungs daily. 06/14/23 07/14/23  Chien, Stephanie, MD  gabapentin  (NEURONTIN ) 300 MG capsule Take 1 capsule (300 mg total) by mouth 3 (three) times daily. 06/14/23 07/14/23  Chien, Stephanie, MD  ipratropium-albuterol  (DUONEB) 0.5-2.5 (3) MG/3ML SOLN Take 3 mLs by nebulization every 6 (six) hours as needed. Patient taking differently: Take 3 mLs by nebulization every 6 (six) hours as needed (For shortness of breath). 05/17/23   Sheril Dines, MD  mometasone -formoterol  (DULERA ) 200-5 MCG/ACT AERO Inhale 2 puffs into the lungs 2 (two) times daily in the am and at bedtime..    [provider]  Multiple Vitamin (MULTIVITAMIN WITH MINERALS) TABS tablet Take 1 tablet by mouth daily. 06/15/23   Chien, Stephanie, MD   naltrexone  (DEPADE) 50 MG tablet Take 1 tablet (50 mg total) by mouth at bedtime. 06/14/23 07/14/23  Chien, Stephanie, MD  propranolol  (INDERAL ) 10 MG tablet Take 1 tablet (10 mg total) by mouth 2 (two) times daily. 06/14/23 07/14/23  Chien, Stephanie, MD  thiamine  (VITAMIN B-1) 100 MG tablet Take 1 tablet (100 mg total) by mouth daily. 06/15/23   Chien, Stephanie, MD  traZODone  (DESYREL ) 50 MG tablet Take 1 tablet (50 mg total) by mouth at bedtime as needed for sleep. 06/14/23 07/14/23  Chien, Stephanie, MD  vitamin D3 (CHOLECALCIFEROL ) 25 MCG tablet Take 1 tablet (1,000 Units total) by mouth daily with breakfast. 06/15/23 07/15/23  Norbert Bean, MD    Allergies: Patient has no known allergies.    Review of Systems  Updated Vital Signs BP (!) 149/94   Pulse 78   Resp (!) 21   Ht 5' 10 (1.778 m)   Wt 77.1 kg   SpO2 100%   BMI 24.39 kg/m   Physical Exam Vitals and nursing note reviewed.  Constitutional:      Appearance: He is well-developed.  HENT:     Head: Normocephalic and atraumatic.   Eyes:     Pupils: Pupils are equal, round, and reactive to light.   Neck:     Vascular: No JVD.   Cardiovascular:     Rate and Rhythm: Normal rate and regular rhythm.     Heart sounds: No murmur  heard.    No friction rub. No gallop.  Pulmonary:     Effort: No respiratory distress.     Breath sounds: Wheezing present.     Comments: Mild diffuse end expiratory wheezes.  No tachypnea. Abdominal:     General: There is no distension.     Tenderness: There is no abdominal tenderness. There is no guarding or rebound.   Musculoskeletal:        General: Normal range of motion.     Cervical back: Normal range of motion and neck supple.   Skin:    Coloration: Skin is not pale.     Findings: No rash.   Neurological:     Mental Status: He is alert and oriented to person, place, and time.   Psychiatric:        Behavior: Behavior normal.     (all labs ordered are listed, but only  abnormal results are displayed) Labs Reviewed - No data to display  EKG: None  Radiology: No results found.   Procedures   Medications Ordered in the ED  AeroChamber Plus Flo-Vu Large MISC 1 each (1 each Other Patient Refused/Not Given 06/21/23 2348)  albuterol  (VENTOLIN  HFA) 108 (90 Base) MCG/ACT inhaler 8 puff (8 puffs Inhalation Given 06/21/23 2348)                                    Medical Decision Making Risk Prescription drug management.   53 yo M with a chief complaints of difficulty breathing.  Feels like he has prior COPD exacerbations.  He is well-appearing nontoxic.  He feels much better after breathing medicine with EMS.  He has run out of his home medications.  Given an albuterol  inhaler here.  Will discharge the patient home.  PCP follow-up.  As I was leaving the room the patient told me he wants help from alcohol  and cocaine.  I encouraged him to follow-up at an outpatient center.  If he felt he wanted to be seen immediately for this then I encouraged him to go to the Behavioral health urgent care center.  Patient does express interest in going there now.  Nursing does have a cab voucher that can provider him which seems appropriate.   The patients results and plan were reviewed and discussed.   Any x-rays performed were independently reviewed by myself.   Differential diagnosis were considered with the presenting HPI.  Medications  AeroChamber Plus Flo-Vu Large MISC 1 each (1 each Other Patient Refused/Not Given 06/21/23 2348)  albuterol  (VENTOLIN  HFA) 108 (90 Base) MCG/ACT inhaler 8 puff (8 puffs Inhalation Given 06/21/23 2348)    Vitals:   06/21/23 2330 06/21/23 2331 06/21/23 2333 06/21/23 2349  BP: (!) 149/94  (!) 149/94   Pulse: 71  69 78  Resp: (!) 26   (!) 21  SpO2: 99%   100%  Weight:  77.1 kg    Height:  5' 10 (1.778 m)      Final diagnoses:  COPD exacerbation (HCC)         Final diagnoses:  COPD exacerbation [facility])    ED Discharge  Orders          Ordered    predniSONE  (DELTASONE ) 20 MG tablet        06/21/23 2351               Albertus Hughs, DO 06/21/23 2353    Albertus Hughs,  DO 06/22/23 0013

## 2023-06-21 NOTE — ED Triage Notes (Signed)
 Pt BIB EMS. EMS reports pt toke up from nap with [facility], reports silent/diminished upper lobes and exportatory wheezes lower lobes. Pt sating in 80's on RA on ems arrival, 97% with mask and neb   Hx afib, copd  Pt states he wouold like assistance with substance use. Pt reports drinking about a 12 pack of beer a day and states using 1 gram of crack daily. Last drink and drug use 10pm last night, per pt. Smokes half pack cigarettes dailey.   EMS admin total Albuterol  10mg , 125 solumedrol, 1 atrovent  EMS VS 138/108

## 2023-06-22 ENCOUNTER — Ambulatory Visit (HOSPITAL_COMMUNITY)
Admission: EM | Admit: 2023-06-22 | Discharge: 2023-06-22 | Disposition: A | Discharge status: Home / Self Care | Payer: MEDICAID | Source: Home / Self Care

## 2023-06-22 DIAGNOSIS — F1910 Other psychoactive substance abuse, uncomplicated: Secondary | ICD-10-CM | POA: Insufficient documentation

## 2023-06-22 DIAGNOSIS — F329 Major depressive disorder, single episode, unspecified: Secondary | ICD-10-CM | POA: Insufficient documentation

## 2023-06-22 DIAGNOSIS — F419 Anxiety disorder, unspecified: Secondary | ICD-10-CM | POA: Insufficient documentation

## 2023-06-22 DIAGNOSIS — Z91148 Patient's other noncompliance with medication regimen for other reason: Secondary | ICD-10-CM | POA: Insufficient documentation

## 2023-06-22 DIAGNOSIS — F1090 Alcohol use, unspecified, uncomplicated: Secondary | ICD-10-CM | POA: Insufficient documentation

## 2023-06-22 DIAGNOSIS — F1490 Cocaine use, unspecified, uncomplicated: Secondary | ICD-10-CM | POA: Insufficient documentation

## 2023-06-22 DIAGNOSIS — Z765 Malingerer [conscious simulation]: Secondary | ICD-10-CM | POA: Insufficient documentation

## 2023-06-22 DIAGNOSIS — F4310 Post-traumatic stress disorder, unspecified: Secondary | ICD-10-CM | POA: Insufficient documentation

## 2023-06-22 DIAGNOSIS — J449 Chronic obstructive pulmonary disease, unspecified: Secondary | ICD-10-CM | POA: Insufficient documentation

## 2023-06-22 DIAGNOSIS — Z5900 Homelessness unspecified: Secondary | ICD-10-CM | POA: Insufficient documentation

## 2023-06-22 NOTE — ED Provider Notes (Signed)
 Behavioral Health Urgent Care Medical Screening Exam  Patient Name: Franklin [name] MRN: [id number] Date of Evaluation: 06/22/23 Chief Complaint:   Diagnosis:  Final diagnoses:  Malingering  Homelessness  Substance abuse (HCC)    History of Present illness: Franklin [name] is a 53 y.o. male. With psychiatric history of PTSD, Substance abuse, Anxiety, Depression and homelessness, who presented voluntarily as a walk in to [facility] requesting substance abuse treatment.  Patient was seen face to face by this provider and chart reviewed. Per chart review, patient is well known to the Behavioral health service line with several inpatient psychiatric admissions and ED/UC visits with similar storyline (drinking a 12 pack to a case daily of beer and using a gram a day of crack via smoking prior to admission)  and requesting psychiatric care. Patient's last admission to the [facility] was on 06/08/23. Per chart review, patient does not follow through with outpatient recommendations for substance abuse/psychiatric treatments.  Patient has significant history of malingering evident by multiple ED/urgent care visits and psychiatric SA hospitalizations with similar requests of substance abuse treatment.  Patient's behaviors are more consistent with someone who is acting in self-preservation, seeking secondary gain of unmet needs for housing and transportation. Patient has not shown any improvements or willingness to follow up with recommendations from past hospitalizations under similar circumstances. He believes he is entitled to transportation and a place to stay/long term residential treatment, provided by the health system.  He was established at [facility] for medication management in [facility]. However, he is non compliant with his medications, citing lack of transportation to his appointments.  He is currently homeless with a sister who he reports is non-supportive and has refused to come take him to her  place.  On approach, Patient presents as non-chalant, first asking provider if he was staying , otherwise he wants to leave because there was no need wasting his time.   Per Triage Pt reports using crack (1gm) and alcohol  (12 pk/beer) within the last 24 hours. He denies withdrawal symptoms.   Patient is encouraged to complete the evaluation process, which he obliges. He reports being discharged from the ED  because I have COPD and don't take medicine, but they gave me an inhaler now.  Patient is asked if he has a PCP for follow up/medication refills, and he states I don't have a means of transportation to go for my appointments, that's why I don't go. If I need to, I go to the ED.  Patient is asked about previous recommended follow up psychiatric appointments and SAIOP. He reports  fuck that, I don't have to do all that, I don't have transportation, I need long term rehab, I was supposed to go to [facility] for long term treatment, but they didn't have a bed, they said a bed would be available on June the 28 th..  Patient became impatient at this point, stating I was asking too many questions and he wanted to leave.  Discussed recommendation for discharge and follow up with SAIOP.  Discussed follow up with area homeless shelters and community resources.resources provided at discharge.   On evaluation, patient is alert, oriented x 3, and cooperative. Speech is clear and coherent.. Pt appears appropriately dressed. Eye contact is fair. Mood is euthymic, affect is congruent with mood. Thought process is coherent and goal directed and thought content is WDL. Pt denies SI/HI/AVH. There is no objective indication that the patient is responding to internal stimuli. No delusions elicited during this assessment.  Patient is provided with opportunity for questions. He verbalize understanding and is in agreement.   Patient will benefit from outpatient psychiatric services with resources provided  along with community resources prior to discharge.  Flowsheet Row ED from 06/22/2023 in [facility] ED from 06/21/2023 in [facility] Emergency Department at [facility] ED from 06/08/2023 in [facility]  C-SSRS RISK CATEGORY No Risk No Risk No Risk    Psychiatric Specialty Exam  Presentation  General Appearance:Appropriate for Environment  Eye Contact:Fair  Speech:Clear and Coherent  Speech Volume:Normal  Handedness:Right   Mood and Affect  Mood: Euthymic  Affect: Congruent   Thought Process  Thought Processes: Goal Directed; Coherent  Descriptions of Associations:Intact  Orientation:Full (Time, Place and Person)  Thought Content:WDL  Diagnosis of Schizophrenia or Schizoaffective disorder in past: No  Duration of Psychotic Symptoms: No data recorded Hallucinations:None  Ideas of Reference:None  Suicidal Thoughts:No -- (denies) Without Intent; Without Plan  Homicidal Thoughts:No   Sensorium  Memory: Immediate Fair  Judgment: Intact  Insight: Present   Executive Functions  Concentration: Good  Attention Span: Good  Recall: Good  Fund of Knowledge: Good  Language: Good   Psychomotor Activity  Psychomotor Activity: Normal Other (comment) (Not noted) No   Assets  Assets: Communication Skills; Desire for Improvement   Sleep  Sleep: Fair  Number of hours:  8   Physical Exam: Physical Exam Constitutional:      General: He is not in acute distress.    Appearance: He is not diaphoretic.  HENT:     Nose: No congestion.   Cardiovascular:     Rate and Rhythm: Normal rate.  Pulmonary:     Effort: No respiratory distress.  Chest:     Chest wall: No tenderness.   Neurological:     Mental Status: He is alert and oriented to person, place, and time.   Psychiatric:        Attention and Perception: Attention and perception normal.        Mood and Affect: Mood  and affect normal.        Speech: Speech normal.        Behavior: Behavior is cooperative.        Thought Content: Thought content normal.        Cognition and Memory: Cognition and memory normal.    Review of Systems  Constitutional:  Negative for chills, diaphoresis and fever.  HENT:  Negative for congestion.   Eyes:  Negative for discharge.  Respiratory:  Negative for cough, shortness of breath and wheezing.   Cardiovascular:  Negative for chest pain and palpitations.  Gastrointestinal:  Negative for diarrhea, nausea and vomiting.  Neurological:  Negative for dizziness, seizures, weakness and headaches.  Psychiatric/Behavioral:  Positive for substance abuse.    Blood pressure (!) 143/76, pulse 73, temperature 98.4 F (36.9 C), temperature source Oral, resp. rate 18, SpO2 98%. There is no height or weight on file to calculate BMI.  Musculoskeletal: Strength & Muscle Tone: within normal limits Gait & Station: normal Patient leans: N/A   [facility] MSE Discharge Disposition for Follow up and Recommendations: Based on my evaluation the patient does not appear to have an emergency medical condition and can be discharged with resources and follow up care in outpatient services for Medication Management, Substance Abuse Intensive Outpatient Program, and Individual Therapy  Pt denies SI/HI/AVH or paranoia. Pt denies withdrawal symptoms. Patient does not meet inpatient psychiatric admission criteria or  IVC criteria. There is no evidence of imminent risk of harm to self or others.   Recommend discharge and follow up with provided outpatient resources for area homeless shelters and substance abuse treatment centers. Patient is agreeable to plan, but mentions severally he does not have a place to stay or means of transportation around town.  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 He is encouraged to use  the resources provided and seek assistance from community social services for his transportation needs.   Discharge recommendations:  Please follow up with your primary care provider for all medical related needs.   Therapy: We recommend that patient participate in individual therapy to address mental health concerns.  Safety:  The patient should abstain from use of illicit substances/drugs and abuse of any medications. If symptoms worsen or do not continue to improve or if the patient becomes actively suicidal or homicidal then it is recommended that the patient return to the closest hospital emergency department, the [facility], or call 911 for further evaluation and treatment. National Suicide Prevention Lifeline 1-800-SUICIDE or 530 176 4561.  About 988 988 offers 24/7 access to trained crisis counselors who can help people experiencing mental health-related distress. People can call or text 988 or chat 988lifeline.org for themselves or if they are worried about a loved one who may need crisis support.  Crisis Mobile: [facility]:                     (250) 302-8386 (for crisis response 24 hours a day) [facility] Hotline:                                            (920) 838-6580   Patient discharged in stable condition  Mayah Urquidi Louisa Rowan, NP 06/22/2023, 2:48 AM

## 2023-06-22 NOTE — Progress Notes (Signed)
   06/22/23 0116  [facility] Triage Screening (Walk-ins at [facility] only)  How Did You Hear About Us ? Self  What Is the Reason for Your Visit/Call Today? Pt presents to [facility] as a voluntary walk-in, unaccompanied requesting substance abuse treatment. Pt reports using crack (1gm) and alcohol  (12 pk/beer) within the last 24 hours. Pt reports diagnosis of Bipolar, Anxiety and Depression. Pt denies taking psychotropic medications at this time, nor is he established with outpatient therapy. Pt desires long term treatment facility to address substance use. Pt currently denies SI,HI,AVH.  How Long Has This Been Causing You Problems? > than 6 months  Have You Recently Had Any Thoughts About Hurting Yourself? No  Are You Planning to Commit Suicide/Harm Yourself At This time? No  Have you Recently Had Thoughts About Hurting Someone Marigene Shoulder? No  Are You Planning To Harm Someone At This Time? No  Explanation: Pt denies  Physical Abuse Denies  Verbal Abuse Denies  Sexual Abuse Denies  Exploitation of patient/patient's resources Denies  Self-Neglect Denies  Possible abuse reported to:  (NA)  Are you currently experiencing any auditory, visual or other hallucinations? No  Have You Used Any Alcohol  or Drugs in the Past 24 Hours? Yes  What Did You Use and How Much? 1 gram of crack and 12 pk beer  Do you have any current medical co-morbidities that require immediate attention? No  Clinician description of patient physical appearance/behavior: Calm, cooperative, casually dressed  What Do You Feel Would Help You the Most Today? Alcohol  or Drug Use Treatment  If access to [facility] was not available, would you have sought care in the Emergency Department? Yes  Determination of Need Urgent (48 hours)  Options For Referral Other: Comment;Outpatient Therapy;Chemical Dependency Intensive Outpatient Therapy (CDIOP);Medication Management;Facility-Based Crisis  Determination of Need filed? Yes

## 2023-06-22 NOTE — Discharge Instructions (Signed)
   Discharge recommendations:  Patient is to take medications as prescribed. Please see information for follow-up appointment with psychiatry and therapy. Please follow up with your primary care provider for all medical related needs.   Therapy: We recommend that patient participate in individual therapy to address mental health concerns.  Safety:  The patient should abstain from use of illicit substances/drugs and abuse of any medications. If symptoms worsen or do not continue to improve or if the patient becomes actively suicidal or homicidal then it is recommended that the patient return to the closest hospital emergency department, the [facility], or call 911 for further evaluation and treatment. National Suicide Prevention Lifeline 1-800-SUICIDE or 343-540-1664.  About 988 988 offers 24/7 access to trained crisis counselors who can help people experiencing mental health-related distress. People can call or text 988 or chat 988lifeline.org for themselves or if they are worried about a loved one who may need crisis support.  Crisis Mobile: [facility]:                     541-402-3479 (for crisis response 24 hours a day) [facility] Hotline:                                            (929) 468-9115

## 2023-06-22 NOTE — ED Notes (Signed)
 Dyspnea has subsudes and pt denies any SHOB or pain. Pt request transportation assistance upon d/c, pt reports he will use provided taxi voucher to follow up with [facility]. Provider aware.

## 2023-07-04 ENCOUNTER — Emergency Department: Payer: MEDICAID

## 2023-07-04 ENCOUNTER — Other Ambulatory Visit: Payer: Self-pay

## 2023-07-04 ENCOUNTER — Emergency Department
Admission: EM | Admit: 2023-07-04 | Discharge: 2023-07-04 | Disposition: A | Discharge status: Home / Self Care | Payer: MEDICAID | Attending: Emergency Medicine | Admitting: Emergency Medicine

## 2023-07-04 DIAGNOSIS — J441 Chronic obstructive pulmonary disease with (acute) exacerbation: Secondary | ICD-10-CM | POA: Insufficient documentation

## 2023-07-04 DIAGNOSIS — Z7901 Long term (current) use of anticoagulants: Secondary | ICD-10-CM | POA: Diagnosis not present

## 2023-07-04 DIAGNOSIS — R0602 Shortness of breath: Secondary | ICD-10-CM | POA: Diagnosis present

## 2023-07-04 LAB — CBC WITH DIFFERENTIAL/PLATELET
Abs Immature Granulocytes: 0.02 10*3/uL (ref 0.00–0.07)
Basophils Absolute: 0.1 10*3/uL (ref 0.0–0.1)
Basophils Relative: 1 %
Eosinophils Absolute: 0.6 10*3/uL — ABNORMAL HIGH (ref 0.0–0.5)
Eosinophils Relative: 8 %
HCT: 38.4 % — ABNORMAL LOW (ref 39.0–52.0)
Hemoglobin: 12.7 g/dL — ABNORMAL LOW (ref 13.0–17.0)
Immature Granulocytes: 0 %
Lymphocytes Relative: 26 %
Lymphs Abs: 2.1 10*3/uL (ref 0.7–4.0)
MCH: 30 pg (ref 26.0–34.0)
MCHC: 33.1 g/dL (ref 30.0–36.0)
MCV: 90.6 fL (ref 80.0–100.0)
Monocytes Absolute: 0.9 10*3/uL (ref 0.1–1.0)
Monocytes Relative: 11 %
Neutro Abs: 4.6 10*3/uL (ref 1.7–7.7)
Neutrophils Relative %: 54 %
Platelets: 263 10*3/uL (ref 150–400)
RBC: 4.24 MIL/uL (ref 4.22–5.81)
RDW: 15.3 % (ref 11.5–15.5)
WBC: 8.3 10*3/uL (ref 4.0–10.5)
nRBC: 0 % (ref 0.0–0.2)

## 2023-07-04 LAB — BASIC METABOLIC PANEL WITH GFR
Anion gap: 11 (ref 5–15)
BUN: 16 mg/dL (ref 6–20)
CO2: 24 mmol/L (ref 22–32)
Calcium: 9 mg/dL (ref 8.9–10.3)
Chloride: 109 mmol/L (ref 98–111)
Creatinine, Ser: 1.29 mg/dL — ABNORMAL HIGH (ref 0.61–1.24)
GFR, Estimated: >60 mL/min (ref >60)
Glucose, Bld: 122 mg/dL — ABNORMAL HIGH (ref 70–99)
Potassium: 3.6 mmol/L (ref 3.5–5.1)
Sodium: 144 mmol/L (ref 135–145)

## 2023-07-04 MED ORDER — PREDNISONE 20 MG PO TABS: 40.0000 mg | ORAL_TABLET | Freq: Every day | ORAL | 0 refills | Status: AC

## 2023-07-04 MED ORDER — SODIUM CHLORIDE 0.9 % IV BOLUS
1000.0000 mL | Freq: Once | INTRAVENOUS | Status: AC
  Administered 2023-07-04: 1000 mL via INTRAVENOUS

## 2023-07-04 MED ORDER — DOXYCYCLINE HYCLATE 100 MG PO CAPS: 100.0000 mg | ORAL_CAPSULE | Freq: Two times a day (BID) | ORAL | 0 refills | Status: DC

## 2023-07-04 NOTE — ED Triage Notes (Signed)
 Pt arrives via [facility] from a ags station with c/o SOB. Pt has a Hx of COPD and states they've been seen here frequently for the same. Per EMS pt's lung were diminished on their arrival and they heard wheezing on the left side. Pt also has a Hx of afib. Pt is supposed to take a blood thinner but hasn't taken in over 1 mo, due to issues getting med.  Pt received 5mg  of albuterol , 1 duoneb, 125mg  of solumedrol and a mag drip from EMS.   Pt is A&Ox4 during triage.

## 2023-07-04 NOTE — ED Provider Notes (Signed)
 [facility] Provider Note    Event Date/Time   First MD Initiated Contact with Patient 07/04/23 1559     (approximate)   History   Chief Complaint: Shortness of Breath   HPI  Franklin [name] is a 53 y.o. male with a history of atrial fibrillation, COPD, substance abuse who [place] due to shortness of breath.  He reports that he started noticing he was getting more short of breath yesterday, which continued worsening throughout today.  EMS report that he initially had diminished breath sounds and pronounced wheezing.  They gave 3 nebs, 125 Solu-Medrol , 2 g mag.  Patient denies chest pain or fever.        Past Medical History:  Diagnosis Date   A-fib [facility])    Anxiety    COPD (chronic obstructive pulmonary disease) (HCC)    Depression    PTSD (post-traumatic stress disorder) 06/11/2023   Substance abuse (HCC)    Vitamin D  insufficiency 06/12/2023    Current Outpatient Rx   Order #: [id number] Class: Normal   Order #: [id number] Class: Normal   Order #: [id number] Class: Normal   Order #: [id number] Class: Print   Order #: [id number] Class: Print   Order #: [id number] Class: Print   Order #: [id number] Class: Print   Order #: [id number] Class: Normal   Order #: [id number] Class: Historical Med   Order #: [id number] Class: OTC   Order #: [id number] Class: Print   Order #: [id number] Class: Print   Order #: [id number] Class: OTC   Order #: [id number] Class: Print   Order #: [id number] Class: Print    Past Surgical History:  Procedure Laterality Date   NO PAST SURGERIES      Physical Exam   Triage Vital Signs: ED Triage Vitals  Encounter Vitals Group     BP      Girls Systolic BP Percentile      Girls Diastolic BP Percentile      Boys Systolic BP Percentile      Boys Diastolic BP Percentile      Pulse      Resp      Temp      Temp src      SpO2      Weight      Height      Head Circumference      Peak Flow      Pain Score       Pain Loc      Pain Education      Exclude from Growth Chart     Most recent vital signs: Vitals:   07/04/23 1730 07/04/23 1800  BP: 138/87 (!) 136/91  Pulse: 86 90  Resp:    Temp:    SpO2: 98% 97%    General: Awake, no distress.  CV:  Good peripheral perfusion.  Regular rate and rhythm Resp:  Normal effort.  Good air entry in all lung fields.  Mildly prolonged expiratory phase.  No audible wheezing Abd:  No distention.  Soft nontender Other:  No lower extremity edema   ED Results / Procedures / Treatments   Labs (all labs ordered are listed, but only abnormal results are displayed) Labs Reviewed  BASIC METABOLIC PANEL WITH GFR - Abnormal; Notable for the following components:      Result Value   Glucose, Bld 122 (*)    Creatinine, Ser 1.29 (*)    All other components within normal limits  CBC WITH DIFFERENTIAL/PLATELET - Abnormal; Notable for the following components:  Hemoglobin 12.7 (*)    HCT 38.4 (*)    Eosinophils Absolute 0.6 (*)    All other components within normal limits     EKG Interpreted by me Sinus rhythm rate of 81.  Normal axis, normal intervals.  Normal QRS ST segments and T waves.  Sinus arrhythmia   RADIOLOGY Chest x-ray interpreted by me, unremarkable.  Radiology report reviewed   PROCEDURES:  Procedures   MEDICATIONS ORDERED IN ED: Medications  sodium chloride  0.9 % bolus 1,000 mL (1,000 mLs Intravenous New Bag/Given 07/04/23 1629)     IMPRESSION / MDM / ASSESSMENT AND PLAN / ED COURSE  I reviewed the triage vital signs and the nursing notes.  DDx: COPD exacerbation, pneumonia, pneumothorax, pulmonary edema  Patient's presentation is most consistent with acute presentation with potential threat to life or bodily function.  Patient presents with shortness of breath and wheezing.  Seems to be improving quickly with initial medications given by EMS.  Will complete nebulizer course and  reassess   ----------------------------------------- 6:44 PM on 07/04/2023 ----------------------------------------- Feels much better.  Lungs clear.  Stable for discharge.      FINAL CLINICAL IMPRESSION(S) / ED DIAGNOSES   Final diagnoses:  COPD exacerbation (HCC)     Rx / DC Orders   ED Discharge Orders          Ordered    predniSONE  (DELTASONE ) 20 MG tablet  Daily with breakfast        07/04/23 1841    doxycycline  (VIBRAMYCIN ) 100 MG capsule  2 times daily        07/04/23 1841             Note:  This document was prepared using Dragon voice recognition software and may include unintentional dictation errors.   Viviann Pastor, MD 07/04/23 [phone]

## 2023-07-09 ENCOUNTER — Emergency Department: Payer: MEDICAID

## 2023-07-09 ENCOUNTER — Other Ambulatory Visit: Payer: Self-pay

## 2023-07-09 ENCOUNTER — Emergency Department (HOSPITAL_COMMUNITY)
Admission: EM | Admit: 2023-07-09 | Discharge: 2023-07-09 | Disposition: A | Discharge status: Home / Self Care | Payer: MEDICAID | Attending: Emergency Medicine | Admitting: Emergency Medicine

## 2023-07-09 ENCOUNTER — Inpatient Hospital Stay
Admission: RE | Admit: 2023-07-09 | Discharge: 2023-07-18 | DRG: 897 | Disposition: A | Discharge status: Home / Self Care | Payer: MEDICAID | Source: Intra-hospital | Attending: Psychiatry | Admitting: Psychiatry

## 2023-07-09 ENCOUNTER — Encounter: Payer: Self-pay | Admitting: Psychiatry

## 2023-07-09 ENCOUNTER — Emergency Department
Admission: EM | Admit: 2023-07-09 | Discharge: 2023-07-09 | Disposition: A | Discharge status: Home / Self Care | Payer: MEDICAID

## 2023-07-09 DIAGNOSIS — F411 Generalized anxiety disorder: Secondary | ICD-10-CM | POA: Diagnosis not present

## 2023-07-09 DIAGNOSIS — J441 Chronic obstructive pulmonary disease with (acute) exacerbation: Secondary | ICD-10-CM | POA: Insufficient documentation

## 2023-07-09 DIAGNOSIS — F1420 Cocaine dependence, uncomplicated: Secondary | ICD-10-CM | POA: Diagnosis present

## 2023-07-09 DIAGNOSIS — Z5321 Procedure and treatment not carried out due to patient leaving prior to being seen by health care provider: Secondary | ICD-10-CM

## 2023-07-09 DIAGNOSIS — Z7951 Long term (current) use of inhaled steroids: Secondary | ICD-10-CM

## 2023-07-09 DIAGNOSIS — Z833 Family history of diabetes mellitus: Secondary | ICD-10-CM | POA: Diagnosis not present

## 2023-07-09 DIAGNOSIS — F332 Major depressive disorder, recurrent severe without psychotic features: Secondary | ICD-10-CM | POA: Diagnosis present

## 2023-07-09 DIAGNOSIS — F1994 Other psychoactive substance use, unspecified with psychoactive substance-induced mood disorder: Secondary | ICD-10-CM | POA: Diagnosis present

## 2023-07-09 DIAGNOSIS — F4310 Post-traumatic stress disorder, unspecified: Secondary | ICD-10-CM | POA: Insufficient documentation

## 2023-07-09 DIAGNOSIS — I4891 Unspecified atrial fibrillation: Secondary | ICD-10-CM | POA: Diagnosis not present

## 2023-07-09 DIAGNOSIS — Z8249 Family history of ischemic heart disease and other diseases of the circulatory system: Secondary | ICD-10-CM

## 2023-07-09 DIAGNOSIS — Z5900 Homelessness unspecified: Secondary | ICD-10-CM | POA: Insufficient documentation

## 2023-07-09 DIAGNOSIS — F1510 Other stimulant abuse, uncomplicated: Secondary | ICD-10-CM | POA: Diagnosis present

## 2023-07-09 DIAGNOSIS — F17210 Nicotine dependence, cigarettes, uncomplicated: Secondary | ICD-10-CM | POA: Diagnosis present

## 2023-07-09 DIAGNOSIS — R45851 Suicidal ideations: Secondary | ICD-10-CM | POA: Diagnosis present

## 2023-07-09 DIAGNOSIS — Z5941 Food insecurity: Secondary | ICD-10-CM

## 2023-07-09 DIAGNOSIS — Z79899 Other long term (current) drug therapy: Secondary | ICD-10-CM | POA: Diagnosis not present

## 2023-07-09 DIAGNOSIS — Z765 Malingerer [conscious simulation]: Secondary | ICD-10-CM

## 2023-07-09 DIAGNOSIS — Z5329 Procedure and treatment not carried out because of patient's decision for other reasons: Secondary | ICD-10-CM | POA: Insufficient documentation

## 2023-07-09 DIAGNOSIS — R0602 Shortness of breath: Secondary | ICD-10-CM | POA: Diagnosis present

## 2023-07-09 DIAGNOSIS — Z5982 Transportation insecurity: Secondary | ICD-10-CM | POA: Diagnosis not present

## 2023-07-09 DIAGNOSIS — Z1152 Encounter for screening for COVID-19: Secondary | ICD-10-CM | POA: Diagnosis not present

## 2023-07-09 DIAGNOSIS — F1590 Other stimulant use, unspecified, uncomplicated: Secondary | ICD-10-CM | POA: Diagnosis not present

## 2023-07-09 DIAGNOSIS — F333 Major depressive disorder, recurrent, severe with psychotic symptoms: Secondary | ICD-10-CM | POA: Diagnosis not present

## 2023-07-09 LAB — URINE DRUG SCREEN, QUALITATIVE (ARMC ONLY)
Amphetamines, Ur Screen: NOT DETECTED
Barbiturates, Ur Screen: NOT DETECTED
Benzodiazepine, Ur Scrn: NOT DETECTED
Cannabinoid 50 Ng, Ur ~~LOC~~: POSITIVE — AB
Cocaine Metabolite,Ur ~~LOC~~: POSITIVE — AB
MDMA (Ecstasy)Ur Screen: NOT DETECTED
Methadone Scn, Ur: NOT DETECTED
Opiate, Ur Screen: NOT DETECTED
Phencyclidine (PCP) Ur S: NOT DETECTED
Tricyclic, Ur Screen: NOT DETECTED

## 2023-07-09 LAB — RESP PANEL BY RT-PCR (RSV, FLU A&B, COVID)  RVPGX2
Influenza A by PCR: NEGATIVE
Influenza B by PCR: NEGATIVE
Resp Syncytial Virus by PCR: NEGATIVE
SARS Coronavirus 2 by RT PCR: NEGATIVE

## 2023-07-09 LAB — CBC
HCT: 44.9 % (ref 39.0–52.0)
Hemoglobin: 15.2 g/dL (ref 13.0–17.0)
MCH: 30 pg (ref 26.0–34.0)
MCHC: 33.9 g/dL (ref 30.0–36.0)
MCV: 88.6 fL (ref 80.0–100.0)
Platelets: 328 K/uL (ref 150–400)
RBC: 5.07 MIL/uL (ref 4.22–5.81)
RDW: 14.8 % (ref 11.5–15.5)
WBC: 7.4 K/uL (ref 4.0–10.5)
nRBC: 0 % (ref 0.0–0.2)

## 2023-07-09 LAB — COMPREHENSIVE METABOLIC PANEL WITH GFR
ALT: 21 U/L (ref 0–44)
AST: 24 U/L (ref 15–41)
Albumin: 4.2 g/dL (ref 3.5–5.0)
Alkaline Phosphatase: 68 U/L (ref 38–126)
Anion gap: 13 (ref 5–15)
BUN: 19 mg/dL (ref 6–20)
CO2: 23 mmol/L (ref 22–32)
Calcium: 9.5 mg/dL (ref 8.9–10.3)
Chloride: 102 mmol/L (ref 98–111)
Creatinine, Ser: 1.12 mg/dL (ref 0.61–1.24)
GFR, Estimated: >60 mL/min (ref >60)
Glucose, Bld: 264 mg/dL — ABNORMAL HIGH (ref 70–99)
Potassium: 3.8 mmol/L (ref 3.5–5.1)
Sodium: 138 mmol/L (ref 135–145)
Total Bilirubin: 1.2 mg/dL (ref 0.0–1.2)
Total Protein: 7.4 g/dL (ref 6.5–8.1)

## 2023-07-09 LAB — URINALYSIS, ROUTINE W REFLEX MICROSCOPIC
Bacteria, UA: NONE SEEN
Bilirubin Urine: NEGATIVE
Glucose, UA: >=500 mg/dL — AB
Hgb urine dipstick: NEGATIVE
Ketones, ur: 80 mg/dL — AB
Leukocytes,Ua: NEGATIVE
Nitrite: NEGATIVE
Protein, ur: 30 mg/dL — AB
Specific Gravity, Urine: 1.03 (ref 1.005–1.030)
Squamous Epithelial / HPF: 0 /HPF (ref 0–5)
pH: 5 (ref 5.0–8.0)

## 2023-07-09 LAB — TROPONIN I (HIGH SENSITIVITY): Troponin I (High Sensitivity): 3 ng/L (ref <18)

## 2023-07-09 LAB — BRAIN NATRIURETIC PEPTIDE: B Natriuretic Peptide: 28.6 pg/mL (ref 0.0–100.0)

## 2023-07-09 MED ORDER — LORAZEPAM 2 MG/ML IJ SOLN: 2.0000 mg | Freq: Three times a day (TID) | INTRAMUSCULAR | Status: DC | PRN

## 2023-07-09 MED ORDER — DIPHENHYDRAMINE HCL 50 MG/ML IJ SOLN: 50.0000 mg | Freq: Three times a day (TID) | INTRAMUSCULAR | Status: DC | PRN

## 2023-07-09 MED ORDER — HALOPERIDOL LACTATE 5 MG/ML IJ SOLN: 10.0000 mg | Freq: Three times a day (TID) | INTRAMUSCULAR | Status: DC | PRN

## 2023-07-09 MED ORDER — ARIPIPRAZOLE 10 MG PO TABS
10.0000 mg | ORAL_TABLET | Freq: Every day | ORAL | Status: DC
  Administered 2023-07-09: 10 mg via ORAL
  Filled 2023-07-09: qty 1

## 2023-07-09 MED ORDER — ALUM & MAG HYDROXIDE-SIMETH 200-200-20 MG/5ML PO SUSP: 30.0000 mL | ORAL | Status: DC | PRN

## 2023-07-09 MED ORDER — IPRATROPIUM-ALBUTEROL 0.5-2.5 (3) MG/3ML IN SOLN
3.0000 mL | Freq: Once | RESPIRATORY_TRACT | Status: AC
  Administered 2023-07-09: 3 mL via RESPIRATORY_TRACT
  Filled 2023-07-09: qty 3

## 2023-07-09 MED ORDER — ARIPIPRAZOLE 5 MG PO TABS
10.0000 mg | ORAL_TABLET | Freq: Every day | ORAL | Status: DC
Start: 2023-07-10 — End: 2023-07-11
  Administered 2023-07-10: 10 mg via ORAL
  Filled 2023-07-09: qty 2

## 2023-07-09 MED ORDER — FLUOXETINE HCL 20 MG PO CAPS
20.0000 mg | ORAL_CAPSULE | Freq: Every day | ORAL | Status: DC
  Administered 2023-07-10 – 2023-07-18 (×9): 20 mg via ORAL
  Filled 2023-07-09 (×9): qty 1

## 2023-07-09 MED ORDER — FLUOXETINE HCL 20 MG PO CAPS
20.0000 mg | ORAL_CAPSULE | Freq: Every day | ORAL | Status: DC
  Administered 2023-07-09: 20 mg via ORAL
  Filled 2023-07-09: qty 1

## 2023-07-09 MED ORDER — HALOPERIDOL LACTATE 5 MG/ML IJ SOLN: 5.0000 mg | Freq: Three times a day (TID) | INTRAMUSCULAR | Status: DC | PRN

## 2023-07-09 MED ORDER — HYDROXYZINE HCL 25 MG PO TABS
25.0000 mg | ORAL_TABLET | Freq: Three times a day (TID) | ORAL | Status: DC | PRN
  Administered 2023-07-10: 25 mg via ORAL
  Filled 2023-07-09: qty 1

## 2023-07-09 MED ORDER — HALOPERIDOL 5 MG PO TABS: 5.0000 mg | ORAL_TABLET | Freq: Three times a day (TID) | ORAL | Status: DC | PRN

## 2023-07-09 MED ORDER — MAGNESIUM HYDROXIDE 400 MG/5ML PO SUSP: 30.0000 mL | Freq: Every day | ORAL | Status: DC | PRN

## 2023-07-09 MED ORDER — DIPHENHYDRAMINE HCL 25 MG PO CAPS: 50.0000 mg | ORAL_CAPSULE | Freq: Three times a day (TID) | ORAL | Status: DC | PRN

## 2023-07-09 MED ORDER — METHYLPREDNISOLONE SODIUM SUCC 125 MG IJ SOLR
125.0000 mg | Freq: Once | INTRAMUSCULAR | Status: AC
  Administered 2023-07-09: 125 mg via INTRAVENOUS
  Filled 2023-07-09: qty 2

## 2023-07-09 MED ORDER — TRAZODONE HCL 50 MG PO TABS: 50.0000 mg | ORAL_TABLET | Freq: Every evening | ORAL | Status: DC | PRN

## 2023-07-09 MED ORDER — TRAZODONE HCL 50 MG PO TABS
50.0000 mg | ORAL_TABLET | Freq: Every evening | ORAL | Status: DC | PRN
  Administered 2023-07-10: 50 mg via ORAL
  Filled 2023-07-09 (×2): qty 1

## 2023-07-09 MED ORDER — ACETAMINOPHEN 325 MG PO TABS: 650.0000 mg | ORAL_TABLET | Freq: Four times a day (QID) | ORAL | Status: DC | PRN

## 2023-07-09 NOTE — Group Note (Signed)
 Date:  07/09/2023 Time:  9:14 PM  Group Topic/Focus:  Overcoming Stress:   The focus of this group is to define stress and help patients assess their triggers.    Participation Level:  Did Not Attend  Participation Quality:  Did Not Attend  Affect:  Did Not Attend  Cognitive:  Did Not Attend  Insight: None  Engagement in Group:  None  Modes of Intervention:  Exploration  Additional Comments:    Laymon ONEIDA Finder 07/09/2023, 9:14 PM

## 2023-07-09 NOTE — ED Notes (Signed)
 Pt says I feel better after paramedics administered medications. Request IV be removed. Says he needs to get home for his daughter.   Declined signing AMA paperwork.   Alert, oriented, and ambulatory. Displays no signs of distress.

## 2023-07-09 NOTE — Progress Notes (Signed)
   07/09/23 1945  Psych Admission Type (Psych Patients Only)  Admission Status Voluntary  Psychosocial Assessment  Patient Complaints Anxiety;Depression  Eye Contact Brief  Facial Expression Animated  Affect Appropriate to circumstance  Speech Logical/coherent;Soft  Interaction Minimal  Motor Activity Slow  Appearance/Hygiene In scrubs  Behavior Characteristics Cooperative;Appropriate to situation  Mood Sad;Pleasant  Thought Process  Coherency WDL  Content WDL  Delusions None reported or observed  Perception WDL  Hallucination None reported or observed  Judgment Impaired  Confusion None  Danger to Self  Current suicidal ideation? Passive  Description of Suicide Plan Denies a plan  Danger to Others  Danger to Others None reported or observed

## 2023-07-09 NOTE — ED Provider Notes (Signed)
 [facility] Provider Note    Event Date/Time   First MD Initiated Contact with Patient 07/09/23 [phone]     (approximate)   History   Suicidal and Shortness of Breath   HPI  Franklin [name] is a 53 y.o. male with a history of substance abuse, COPD, atrial fibrillation who presents with suicidal thoughts as well as shortness of breath.  Notably the patient was seen here overnight for significant shortness of breath but eloped.  Denies fevers, has thoughts of walking into traffic but contracts for safety     Physical Exam   Triage Vital Signs: ED Triage Vitals  Encounter Vitals Group     BP 07/09/23 0739 (!) 164/93     Girls Systolic BP Percentile --      Girls Diastolic BP Percentile --      Boys Systolic BP Percentile --      Boys Diastolic BP Percentile --      Pulse Rate 07/09/23 0739 100     Resp 07/09/23 0739 20     Temp 07/09/23 0739 97.9 F (36.6 C)     Temp src --      SpO2 07/09/23 0739 93 %     Weight 07/09/23 0756 74.8 kg (165 lb)     Height 07/09/23 0756 1.778 m (5' 10)     Head Circumference --      Peak Flow --      Pain Score 07/09/23 0756 0     Pain Loc --      Pain Education --      Exclude from Growth Chart --     Most recent vital signs: Vitals:   07/09/23 0739  BP: (!) 164/93  Pulse: 100  Resp: 20  Temp: 97.9 F (36.6 C)  SpO2: 93%     General: Awake, no distress.  CV:  Good peripheral perfusion.  Resp:  Mild tachypnea, scattered wheezing Abd:  No distention.  Other:     ED Results / Procedures / Treatments   Labs (all labs ordered are listed, but only abnormal results are displayed) Labs Reviewed  COMPREHENSIVE METABOLIC PANEL WITH GFR - Abnormal; Notable for the following components:      Result Value   Glucose, Bld 264 (*)    All other components within normal limits  RESP PANEL BY RT-PCR (RSV, FLU A&B, COVID)  RVPGX2  CBC  BRAIN NATRIURETIC PEPTIDE  URINE DRUG SCREEN, QUALITATIVE ([facility] ONLY)   URINALYSIS, ROUTINE W REFLEX MICROSCOPIC  TROPONIN I (HIGH SENSITIVITY)     EKG  ED ECG REPORT I, Lamar Price, the attending physician, personally viewed and interpreted this ECG.  Date: 07/09/2023  Rhythm: Sinus tachycardia QRS Axis: normal Intervals: normal ST/T Wave abnormalities: normal Narrative Interpretation: no evidence of acute ischemia    RADIOLOGY Chest x-ray viewed interpreted by me, no acute abnormality    PROCEDURES:  Critical Care performed:   Procedures   MEDICATIONS ORDERED IN ED: Medications  ARIPiprazole  (ABILIFY ) tablet 10 mg (has no administration in time range)  FLUoxetine  (PROZAC ) capsule 20 mg (has no administration in time range)  traZODone  (DESYREL ) tablet 50 mg (has no administration in time range)  ipratropium-albuterol  (DUONEB) 0.5-2.5 (3) MG/3ML nebulizer solution 3 mL (3 mLs Nebulization Given 07/09/23 0930)  ipratropium-albuterol  (DUONEB) 0.5-2.5 (3) MG/3ML nebulizer solution 3 mL (3 mLs Nebulization Given 07/09/23 0929)  methylPREDNISolone  sodium succinate (SOLU-MEDROL ) 125 mg/2 mL injection 125 mg (125 mg Intravenous Given 07/09/23 0930)  IMPRESSION / MDM / ASSESSMENT AND PLAN / ED COURSE  I reviewed the triage vital signs and the nursing notes. Patient's presentation is most consistent with severe exacerbation of chronic illness.  Patient presents with shortness of breath as detailed above, he does have wheezing on exam and I suspect COPD exacerbation, differential includes pneumonia.  Given his substance abuse possibility of CHF/ACS but no chest pain at this time  Will give DuoNeb, steroids, obtain labs, chest x-ray.  After medically cleared will consult psychiatry  Lab work, x-ray is reassuring, he is feeling better after treatment, he is medically cleared for psychiatric evaluation.      FINAL CLINICAL IMPRESSION(S) / ED DIAGNOSES   Final diagnoses:  COPD exacerbation (HCC)  Suicidal ideation     Rx / DC Orders    ED Discharge Orders     None        Note:  This document was prepared using Dragon voice recognition software and may include unintentional dictation errors.   Arlander Charleston, MD 07/09/23 1257

## 2023-07-09 NOTE — BH Assessment (Signed)
 Tele consult will be completed by [facility].  [facility] Coordinator will communicate in secure chat assessment time and provider name.

## 2023-07-09 NOTE — ED Notes (Signed)
 Pt dressed out into beh scrubs with this tech and Alaina Rn, belongings include:   Black t shirt Black hat Grey socks Grey pants  [employer]

## 2023-07-09 NOTE — ED Triage Notes (Signed)
 Pt c/o SOB and SI w/ plan to jump off a bridge starting last night.  Pt reports he is SI d/t bad drug problem.    Pt reports using Adderall and crack yesterday.  No drug or ETOH use this morning.   Pt is easily speaking full sentences.

## 2023-07-09 NOTE — ED Triage Notes (Signed)
 Arrives [facility] from home in respiratory distress.   Paramedics say pt was initially sitting tripod and unable to speak in sentences.   Received 0.3mg  IM Epinephrine .  2g Magnesium  10mg  albuterol   125mg  solumedrol   Admits to adderall and crack use this morning.

## 2023-07-09 NOTE — BH Assessment (Signed)
 Patient has been tentatively accepted to [facility], pending Negative PCR, Covid and UDS, and UA. Volunteer Consent  to be fax to [phone] and original with Pt at transfer or use ECON SENT. Attending Physician will be Layne, MD Pt has been assigned to room L 27 RN to report to [phone]  ER staff is aware of the admission: -Lyndy, ER Secretary -Dr. Arlander, ER MD -Leontine, Patient's Nurse

## 2023-07-09 NOTE — Plan of Care (Signed)
 New admission  Problem: Education: Goal: Knowledge of the prescribed therapeutic regimen will improve Outcome: Not Progressing   Problem: Activity: Goal: Interest or engagement in leisure activities will improve Outcome: Not Progressing   Problem: Coping: Goal: Will verbalize feelings Outcome: Not Progressing

## 2023-07-09 NOTE — ED Provider Notes (Signed)
 [facility] Provider Note    Event Date/Time   First MD Initiated Contact with Patient 07/09/23 [phone]     (approximate)   History   Shortness of Breath  Arrives [facility] from home in respiratory distress.   Paramedics say pt was initially sitting tripod and unable to speak in sentences.   Received 0.3mg  IM Epinephrine .  2g Magnesium  10mg  albuterol   125mg  solumedrol   Admits to adderall and crack use this morning.    HPI Franklin [name] is a 53 y.o. male PMH COPD, polysubstance use, A-fib, homelessness brought in by ambulance for reported difficulty breathing - Per nursing report from EMS, patient was currently in severe respiratory distress on their arrival.  Received a dose of IM epinephrine  as well as 2 g magnesium , 125 mg Solu-Medrol , serial DuoNebs.  On arrival to emergency department was reportedly speaking full sentences, satting 99% on room air with normal work of breathing.  About 5 minutes after arriving to emergency department and prior to my evaluation, patient eloped from the emergency department.        Physical Exam   Triage Vital Signs: ED Triage Vitals  Encounter Vitals Group     BP 07/09/23 0048 (!) 188/108     Girls Systolic BP Percentile --      Girls Diastolic BP Percentile --      Boys Systolic BP Percentile --      Boys Diastolic BP Percentile --      Pulse Rate 07/09/23 0048 (!) 107     Resp 07/09/23 0048 15     Temp 07/09/23 0048 98.3 F (36.8 C)     Temp src --      SpO2 07/09/23 0048 99 %     Weight 07/09/23 0052 165 lb (74.8 kg)     Height 07/09/23 0052 5' 10 (1.778 m)     Head Circumference --      Peak Flow --      Pain Score 07/09/23 0052 0     Pain Loc --      Pain Education --      Exclude from Growth Chart --     Most recent vital signs: Vitals:   07/09/23 0048  BP: (!) 188/108  Pulse: (!) 107  Resp: 15  Temp: 98.3 F (36.8 C)  SpO2: 99%    Eloped prior to exam.  ED Results / Procedures /  Treatments   Labs (all labs ordered are listed, but only abnormal results are displayed) Labs Reviewed - No data to display Critical Care performed: No  Procedures   MEDICATIONS ORDERED IN ED: Medications - No data to display   IMPRESSION / MDM / ASSESSMENT AND PLAN / ED COURSE  I reviewed the triage vital signs and the nursing notes.                              DDX/MDM/AP: Differential diagnosis includes, but is not limited to, likely COPD exacerbation.  I am unable to comment in details patient eloped shortly after arriving to emergency department.  Did have decision-making capacity per nurse bedside.  ELOPED from ED shortly after arrival and prior to evaluation.      FINAL CLINICAL IMPRESSION(S) / ED DIAGNOSES   Final diagnoses:  COPD exacerbation (HCC)  Eloped from emergency department     Rx / DC Orders   ED Discharge Orders     None  Note:  This document was prepared using Dragon voice recognition software and may include unintentional dictation errors.   Clarine Ozell LABOR, MD 07/09/23 0100

## 2023-07-09 NOTE — Progress Notes (Signed)
 Admission Note:  Franklin [name] was admitted voluntarily from [facility] emergency department.  Patient presented to the ER for shortness of breath and suicidal thoughts with a plan to jump off a bridge.  Pleasant and cooperative with admission process.  Flat affect  Endorses anxiety and depression.  Denies having any suicidal thoughts or plans. Denies HI and AVH.  Denies pain. Patient reports drinking a 12 pack of beers a day and substance abuse.  Denies smoking cigarettes.   Franklin [name] is homeless and has difficulties obtaining food, medications as well as transportation. Reports no support outside of the hospital.  No recent falls and he does not use any assistive devices. Skin assessment completed with Norleen, MHT.  No contraband found.   Patient oriented to the unit policies.  Questions and concerns addressed.  Patient lying down in his room.

## 2023-07-09 NOTE — ED Notes (Signed)
 Patient has been excessively eating upon his arrival.

## 2023-07-09 NOTE — Tx Team (Signed)
 Initial Treatment Plan 07/09/2023 3:13 PM [name] FMW:[id number]    PATIENT STRESSORS: Substance abuse     PATIENT STRENGTHS: Capable of independent living    PATIENT IDENTIFIED PROBLEMS:     Anxiety  Depression  Substance Abuse             DISCHARGE CRITERIA:  Ability to meet basic life and health needs Adequate post-discharge living arrangements Improved stabilization in mood, thinking, and/or behavior Safe-care adequate arrangements made  PRELIMINARY DISCHARGE PLAN: Attend aftercare/continuing care group Placement in alternative living arrangements  PATIENT/FAMILY INVOLVEMENT: This treatment plan has been presented to and reviewed with the patient, Franklin [name], and/or family member. The patient has been given the opportunity to ask questions ans make suggestions.   Garen CINDERELLA Daring, RN 07/09/2023, 3:13 PM

## 2023-07-09 NOTE — BH Assessment (Signed)
 Comprehensive Clinical Assessment (CCA) Note  07/09/2023 Franklin [name] [id number]  Chief Complaint:  Chief Complaint  Patient presents with   Suicidal   Shortness of Breath   Visit Diagnosis:    F32.3 Major depressive disorder, Single episode, With psychotic features F14.20 Cocaine use disorder, Severe    Flowsheet Row ED from 07/09/2023 in [facility] Emergency Department at [facility] Most recent reading at 07/09/2023  7:57 AM ED from 07/09/2023 in [facility] Emergency Department at [facility] Most recent reading at 07/09/2023 12:52 AM ED from 07/04/2023 in [facility] Emergency Department at [facility] Most recent reading at 07/04/2023  4:03 PM  C-SSRS RISK CATEGORY High Risk No Risk No Risk    The patient demonstrates the following risk factors for suicide: Chronic risk factors for suicide include: psychiatric disorder of major depression disorder, PTSD, substance use disorder, and previous suicide attempts jumping from a bridge. Acute risk factors for suicide include: unemployment, social withdrawal/isolation, and loss (financial, interpersonal, professional). Protective factors for this patient include: positive social support, positive therapeutic relationship, coping skills, and hope for the future. Considering these factors, the overall suicide risk at this point appears to be high. Patient is not appropriate for outpatient follow up.   Disposition: Franklin [name] Alert, MD, patient meets inpatient criteria.  [facility] AC contact and bed availability under review.  Disposition discussed with Geographical information systems officer.  RN to discuss with EDP.  Franklin [name] is a 53 year old male who presents voluntarily to [facility] ED via EMS and unaccompanied.  Pt reports SI with a plan to jump off a bridge, started last night.  Pt reports Paranoia.  Pt denies HI, or AVH.  Pt acknowledged the following symptoms; anxious, irritable, tension, fatigue, loss of interest, decreased concentration, guilt and feeling  worthlessness.  Pt reports sleeping six hours during the night; also, reports decreased appetite.  Pt reports one previous suicide attempt by jumping from bridge.  Pt denies any history of intentional self-injurious behaviors.  Pt says he has been using Crack Cocaine, $200.00 dollar a day, "I want some more crack cocaine, I really want to stopped using crack cocaine.".  Pt says he has been drinking 40oz beer daily.  Pt denies using any other substance use.  Pt identifies his primary stressor as with using drugs and smoking Crack Cocaine.  Pt reports he is currently homeless.  Pt reports no support person.  Pt reports a family history of mental illness; also, reports a family history of substance used.  Pt denies any history of abuse or trauma.  Pt denies any current legal problems.  Pt denies any guns or weapons in the home.  Pt says he is not currently receiving weekly outpatient therapy; also, reports not receiving outpatient medication management.  Pt reports he has participated in drug rehabilitation "I have a bad drug problem, I need help", several years ago, unable to identify services.  Pt reports he has a diagnosis of COPD.  Pt is dressed in scrubs, alert, oriented x 4 with articulation error speech and restless motor behavior.  Eye contact is fleeting.  Pt mood is anxious and affect is anxious.  Thought process relevant.  Pt's insight is fair and judgment is impaired.  There is some indication Pt is currently responding to internal stimuli or experiencing delusional thought content.  Pt is was cooperative throughout assessment.   CCA Screening, Triage and Referral (STR)  Patient Reported Information How did you hear about us ? Other (Comment) (EMS)  What Is the Reason for  Your Visit/Call Today? SOB and SI w/ plan to jump off a bridge starting last night.  Pt reports he is SI d/t bad drug problem.  How Long Has This Been Causing You Problems? <Week  What Do You Feel Would Help You the Most  Today? Alcohol  or Drug Use Treatment; Treatment for Depression or other mood problem   Have You Recently Had Any Thoughts About Hurting Yourself? Yes  Are You Planning to Commit Suicide/Harm Yourself At This time? No   Flowsheet Row ED from 07/09/2023 in [facility] Emergency Department at [facility] Most recent reading at 07/09/2023  7:57 AM ED from 07/09/2023 in [facility] Emergency Department at [facility] Most recent reading at 07/09/2023 12:52 AM ED from 07/04/2023 in [facility] Emergency Department at [facility] Most recent reading at 07/04/2023  4:03 PM  C-SSRS RISK CATEGORY High Risk No Risk No Risk    Have you Recently Had Thoughts About Hurting Someone Franklin [name]? No  Are You Planning to Harm Someone at This Time? No  Explanation: Pt reports I used a lot of crack cocaine yesterday   Have You Used Any Alcohol  or Drugs in the Past 24 Hours? Yes  How Long Ago Did You Use Drugs or Alcohol ? Alcohol , Crack Cocaine, I used between $200.00 and $300.00 worth  What Did You Use and How Much? Crack Cocaine, Beers 40oz   Do You Currently Have a Therapist/Psychiatrist? No  Name of Therapist/Psychiatrist:    Have You Been Recently Discharged From Any Office Practice or Programs? No  Explanation of Discharge From Practice/Program: none   CCA Screening Triage Referral Assessment Type of Contact: Face-to-Face  Telemedicine Service Delivery:   Is this Initial or Reassessment?   Date Telepsych consult ordered in [facility]:    Time Telepsych consult ordered in [facility]:    Location of Assessment: [facility] ED  Provider Location: [facility] ED   Collateral Involvement: No collateral involved   Does Patient Have a Court Appointed Legal Guardian? No  Legal Guardian Contact Information: -- (n/a)  Copy of Legal Guardianship Form: -- (n/a)  Legal Guardian Notified of Arrival: -- (n/a)  Legal Guardian Notified of Pending Discharge: -- (n/a)  If Minor and Not Living with Parent(s),  Who has Custody? -- (n/a)  Is CPS involved or ever been involved? -- (n/a)  Is APS involved or ever been involved? -- (n/a)   Patient Determined To Be At Risk for Harm To Self or Others Based on Review of Patient Reported Information or Presenting Complaint? Yes, for Self-Harm  Method: Plan without intent (Jump over bridge)  Availability of Means: No access or NA  Intent: Vague intent or NA  Notification Required: No need or identified person  Additional Information for Danger to Others Potential: -- (n/a)  Additional Comments for Danger to Others Potential: -- (n/a)  Are There Guns or Other Weapons in Your Home? No  Types of Guns/Weapons: Pt reports no guns or weapons in his possessons  Are These Geophysical data processor Secured?                            -- (n/a)  Who Could Verify You Are Able To Have These Secured: -- (n/a)  Do You Have any Outstanding Charges, Pending Court Dates, Parole/Probation? No  Contacted To Inform of Risk of Harm To Self or Others: Other: Comment (No need to identify)    Does Patient Present under Involuntary Commitment? No  [place] of Residence: [place]   Patient Currently Receiving the Following Services: Not Receiving Services   Determination of Need: Urgent (48 hours)   Options For Referral: Facility-Based Crisis     CCA Biopsychosocial Patient Reported Schizophrenia/Schizoaffective Diagnosis in Past: No   Strengths: Have some insight, seeking help and polite.   Mental Health Symptoms Depression:  Difficulty Concentrating; Hopelessness; Worthlessness   Duration of Depressive symptoms: Duration of Depressive Symptoms: Greater than two weeks   Mania:  Recklessness; Change in energy/activity   Anxiety:   Difficulty concentrating; Restlessness; Sleep   Psychosis:  Grossly disorganized or catatonic behavior; Affective flattening/alogia/avolition   Duration of Psychotic symptoms: Duration of Psychotic Symptoms: Less than six  months   Trauma:  N/A   Obsessions:  Disrupts routine/functioning; Recurrent & persistent thoughts/impulses/images   Compulsions:  Driven to perform behaviors/acts; Disrupts with routine/functioning; Poor Insight; Repeated behaviors/mental acts   Inattention:  None   Hyperactivity/Impulsivity:  N/A   Oppositional/Defiant Behaviors:  N/A   Emotional Irregularity:  Potentially harmful impulsivity; Recurrent suicidal behaviors/gestures/threats; Transient, stress-related paranoia/disassociation   Other Mood/Personality Symptoms:  Depression/Anxious    Mental Status Exam Appearance and self-care  Stature:  Average   Weight:  Average weight   Clothing:  Disheveled (Pt dressed in scrubs)   Grooming:  Normal   Cosmetic use:  None   Posture/gait:  Tense; Bizarre   Motor activity:  Agitated; Restless; Repetitive   Sensorium  Attention:  Distractible; Persistent   Concentration:  Preoccupied; Variable   Orientation:  Situation; Object; Person; Place   Recall/memory:  Normal   Affect and Mood  Affect:  Appropriate; Full Range   Mood:  Anxious   Relating  Eye contact:  Fleeting   Facial expression:  Depressed; Responsive; Tense; Anxious   Attitude toward examiner:  Cooperative   Thought and Language  Speech flow: Articulation error; Flight of Ideas   Thought content:  Appropriate to Mood and Circumstances   Preoccupation:  None   Hallucinations:  None   Organization:  Disorganized; Circumstantial; Loose   Company secretary of Knowledge:  Fair   Intelligence:  Average   Abstraction:  Functional   Judgement:  Fair; Impaired   Reality Testing:  Realistic   Insight:  Fair   Decision Making:  Normal   Social Functioning  Social Maturity:  Isolates   Social Judgement:  Naive; Chief of Staff; Victimized   Stress  Stressors:  Surveyor, quantity; Housing   Coping Ability:  Exhausted   Skill Deficits:  Self-care; Self-control; Decision making    Supports:  Support needed     Religion: Religion/Spirituality Are You A Religious Person?: No How Might This Affect Treatment?: N/A  Leisure/Recreation: Leisure / Recreation Do You Have Hobbies?: Yes Leisure and Hobbies: baskeball  Exercise/Diet: Exercise/Diet Do You Exercise?: Yes What Type of Exercise Do You Do?: Run/Walk How Many Times a Week Do You Exercise?: 1-3 times a week Have You Gained or Lost A Significant Amount of Weight in the Past Six Months?: No Do You Follow a Special Diet?: No Do You Have Any Trouble Sleeping?: Yes Explanation of Sleeping Difficulties: Pt reports sleeping six hours during the day   CCA Employment/Education Employment/Work Situation: Employment / Work Situation Employment Situation: Unemployed Patient's Job has Been Impacted by Current Illness: No Has Patient ever Been in Equities trader?: No  Education: Education Is Patient Currently Attending School?: No Last Grade Completed: 12 Did You Attend College?: Yes What Type of College Degree Do you Have?: Pt reports attended  [employer]Psych[employer] Did You Have An Individualized Education Program (IIEP): No Did You Have Any Difficulty At School?: No Patient's Education Has Been Impacted by Current Illness: No   CCA Family/Childhood History Family and Relationship History: Family history Marital status: Single Does patient have children?: No  Childhood History:  Childhood History By whom was/is the patient raised?: Both parents Did patient suffer any verbal/emotional/physical/sexual abuse as a child?: No Did patient suffer from severe childhood neglect?: No Has patient ever been sexually abused/assaulted/raped as an adolescent or adult?: No Was the patient ever a victim of a crime or a disaster?: No Witnessed domestic violence?: No Has patient been affected by domestic violence as an adult?: No       CCA Substance Use Alcohol /Drug Use: Alcohol  / Drug Use Pain Medications:  See MAR Prescriptions: See MAR Over the Counter: See MAR History of alcohol  / drug use?: Yes Longest period of sobriety (when/how long): Unable to quantify Withdrawal Symptoms: Seizures, Tremors Onset of Seizures: UTA Date of most recent seizure: UTA Substance #1 Name of Substance 1: Crack Cocaine 1 - Age of First Use: 29 1 - Amount (size/oz): $200.00 a day 1 - Frequency: daily 1 - Duration: ongoing 1 - Last Use / Amount: 07/08/23 1 - Method of Aquiring: UTA 1- Route of Use: smoking Substance #2 Name of Substance 2: Alcohol  2 - Age of First Use: 18 2 - Amount (size/oz): Beer 40oz 2 - Frequency: daily 2 - Duration: ongoing 2 - Last Use / Amount: 07/08/23 2 - Method of Aquiring: UTA 2 - Route of Substance Use: drinking                     ASAM's:  Six Dimensions of Multidimensional Assessment  Dimension 1:  Acute Intoxication and/or Withdrawal Potential:   Dimension 1:  Description of individual's past and current experiences of substance use and withdrawal: 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  Dimension 2:  Biomedical Conditions and Complications:   Dimension 2:  Description of patient's biomedical conditions and  complications: Patient suffers from COPD  Dimension 3:  Emotional, Behavioral, or Cognitive Conditions and Complications:  Dimension 3:  Description of emotional, behavioral, or cognitive conditions and complications: Pt has a hx of Bipolar disorder and Paranoid hallucinations.  Dimension 4:  Readiness to Change:  Dimension 4:  Description of Readiness to Change criteria: Patient seeking mental health assistance, howver fails to follow through with recommendations  Dimension 5:  Relapse, Continued use, or Continued Problem Potential:  Dimension 5:  Relapse, continued use, or continued problem potential critiera description: Patient continues to relapse  Dimension 6:  Recovery/Living Environment:  Dimension 6:  Recovery/Iiving  environment criteria description: Patient is currently homeless  ASAM Severity Score: ASAM's Severity Rating Score: 16  ASAM Recommended Level of Treatment: ASAM Recommended Level of Treatment: Level II Partial Hospitalization Treatment   Substance use Disorder (SUD) Substance Use Disorder (SUD)  Checklist Symptoms of Substance Use: Persistent desire or unsuccessful efforts to cut down or control use, Continued use despite persistent or recurrent social, interpersonal problems, caused or exacerbated by use, Continued use despite having a persistent/recurrent physical/psychological problem caused/exacerbated by use, Presence of craving or strong urge to use, Large amounts of time spent to obtain, use or recover from the substance(s), Recurrent use that results in a failure to fulfill major role obligations (work, school, home)  Recommendations for Services/Supports/Treatments: Recommendations for Services/Supports/Treatments Recommendations For Services/Supports/Treatments: Individual Therapy, Medication Management,  SAIOP (Substance Abuse Intensive Outpatient Program), Inpatient Hospitalization  Disposition Recommendation per psychiatric provider: We recommend inpatient psychiatric hospitalization when medically cleared. Patient is under voluntary admission status at this time; please IVC if attempts to leave hospital.   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/27/2022   PVC (premature ventricular contraction) 07/13/2022   Solitary pulmonary nodule 07/13/2022   Prediabetes 07/13/2022   Abnormal echocardiogram 07/13/2022   Chronic obstructive  pulmonary disease (HCC) 06/18/2022   COPD exacerbation (HCC) 05/24/2022   Generalized anxiety disorder    Substance abuse (HCC) 03/22/2015   Tobacco use disorder 10/06/2014   Cocaine use disorder, severe, dependence (HCC) 10/02/2014   Alcohol  use disorder, severe, dependence (HCC) 06/18/2014   Suicidal ideation 04/08/2012     Referrals to Alternative Service(s): Referred to Alternative Service(s):   Place:   Date:   Time:    Referred to Alternative Service(s):   Place:   Date:   Time:    Referred to Alternative Service(s):   Place:   Date:   Time:    Referred to Alternative Service(s):   Place:   Date:   Time:     Alean Olds, [facility]

## 2023-07-09 NOTE — Consult Note (Signed)
 [facility] Consult Note  Patient Name: Franklin [name] MRN: [id number] DOB: 24-Jan-1970 DATE OF Consult: 07/09/2023  PRIMARY PSYCHIATRIC DIAGNOSES  1.  MDD, recurrent, severe, no psychotic features; SI 2.  PTSD; Cocaine use disorder, severe 3.  Homelessness  RECOMMENDATIONS  Recommendations:   Medication recommendations:  - for mood/anxiety, fluoxetine  20 mg po daily and Abilify  10 mg po daily; and trazodone  50 mg po nightly PRN for insomnia  Non-Medication/therapeutic recommendations: psych admission; substance abuse treatment  Is inpatient psychiatric hospitalization recommended for this patient? Yes (Explain why): interrupted suicide attempt; suicidal ideation  Follow-Up Telepsychiatry C/L services: We will sign off for now. Please re-consult our service if needed for any concerning changes in the patient's condition, discharge planning, or questions.  Communication: Treatment team members (and family members if applicable) who were involved in treatment/care discussions and planning, and with whom we spoke or engaged with via secure text/chat, include the following: EDRN  Thank you for involving us  in the care of this patient. If you have any additional questions or concerns, please call [phone] and ask for me or the provider on-call.   TELEPSYCHIATRY ATTESTATION & CONSENT  As the provider for this telehealth consult, I attest that I verified the patient's identity using two separate identifiers, introduced myself to the patient, provided my credentials, disclosed my location, and performed this encounter via a HIPAA-compliant, real-time, face-to-face, two-way, interactive audio and video platform and with the full consent and agreement of the patient (or guardian as applicable.)  Patient physical location: ED at [facility]. Telehealth provider physical location: home office in state of [place] .  Video start time: 1000a CST (Central Time) Video end time:  1013a CST (Central Time)  IDENTIFYING DATA  Franklin [name] is a 53 y.o. year-old male for whom a psychiatric consultation has been ordered by the primary provider. The patient was identified using two separate identifiers.  CHIEF COMPLAINT/REASON FOR CONSULT   Depression; suicidal ideation   HISTORY OF PRESENT ILLNESS (HPI)  The patient is a 53 year old man with history of MDD, PTSD; whom presented to ED with c/o active suicidal ideation with plan to jump off a bridge.  The patient, when seen, is awake, alert, cooperative, and oriented to self, place, context; he reports stressors including six months of homelessness (after the passing of his mother and her house being sold), the anniversary of his deceased teenage son (reports anniversary was Monday); and daily drug use, endorsing both cocaine and alcohol  use, described as 'too much' re: amounts. He reports having been standing at a bridge preparing to jump before being reportedly stopped by police. He is help-seeking and wishes to ultimately get treatment at a 30-day program, though I discussed at present his suicidal ideation and reported aborted attempt are concerning enough to warrant inpatient Psychiatric admission. He is open to this; he states he wants to be back on his medications which he hasn't taken in months. He reports previously taking aripiprazole  and fluoxetine . He denies HI, he denies AVH.  I recommend Psychiatric admission and fluoxetine  (Prozac ) 20 mg po daily and aripiprazole  (Abilify ) 10 mg po nightly   PAST PSYCHIATRIC HISTORY  Psych admissions: Yes Suicide attempts: Yes, per chart had plan to hang self Outpatient treatment: Denies current Prior med trials: Yes; see HPI History of abuse.trauma: Yes  Otherwise as per HPI above.  PAST MEDICAL HISTORY  Past Medical History:  Diagnosis Date   A-fib [facility])    Anxiety    COPD (chronic obstructive pulmonary disease) (  HCC)    Depression    PTSD (post-traumatic stress  disorder) 06/11/2023   Substance abuse (HCC)    Vitamin D  insufficiency 06/12/2023     HOME MEDICATIONS  Facility Ordered Medications  Medication   [COMPLETED] ipratropium-albuterol  (DUONEB) 0.5-2.5 (3) MG/3ML nebulizer solution 3 mL   [COMPLETED] ipratropium-albuterol  (DUONEB) 0.5-2.5 (3) MG/3ML nebulizer solution 3 mL   [COMPLETED] methylPREDNISolone  sodium succinate (SOLU-MEDROL ) 125 mg/2 mL injection 125 mg   PTA Medications  Medication Sig   albuterol  (VENTOLIN  HFA) 108 (90 Base) MCG/ACT inhaler Inhale 2 puffs into the lungs every 6 (six) hours as needed for wheezing or shortness of breath.   ipratropium-albuterol  (DUONEB) 0.5-2.5 (3) MG/3ML SOLN Take 3 mLs by nebulization every 6 (six) hours as needed. (Patient taking differently: Take 3 mLs by nebulization every 6 (six) hours as needed (For shortness of breath).)   mometasone -formoterol  (DULERA ) 200-5 MCG/ACT AERO Inhale 2 puffs into the lungs 2 (two) times daily in the am and at bedtime..   propranolol  (INDERAL ) 10 MG tablet Take 1 tablet (10 mg total) by mouth 2 (two) times daily.   ARIPiprazole  (ABILIFY ) 10 MG tablet Take 1 tablet (10 mg total) by mouth daily.   FLUoxetine  (PROZAC ) 20 MG capsule Take 1 capsule (20 mg total) by mouth daily.   traZODone  (DESYREL ) 50 MG tablet Take 1 tablet (50 mg total) by mouth at bedtime as needed for sleep.   naltrexone  (DEPADE) 50 MG tablet Take 1 tablet (50 mg total) by mouth at bedtime.   gabapentin  (NEURONTIN ) 300 MG capsule Take 1 capsule (300 mg total) by mouth 3 (three) times daily.   vitamin D3 (CHOLECALCIFEROL ) 25 MCG tablet Take 1 tablet (1,000 Units total) by mouth daily with breakfast.   Multiple Vitamin (MULTIVITAMIN WITH MINERALS) TABS tablet Take 1 tablet by mouth daily.   thiamine  (VITAMIN B-1) 100 MG tablet Take 1 tablet (100 mg total) by mouth daily.   fluticasone  furoate-vilanterol (BREO ELLIPTA ) 100-25 MCG/ACT AEPB Inhale 1 puff into the lungs daily.   doxycycline  (VIBRAMYCIN )  100 MG capsule Take 1 capsule (100 mg total) by mouth 2 (two) times daily for 7 days.     ALLERGIES  No Known Allergies  SOCIAL & SUBSTANCE USE HISTORY  Social History   Socioeconomic History   Marital status: Single    Spouse name: Not on file   Number of children: Not on file   Years of education: Not on file   Highest education level: Not on file  Occupational History   Occupation: [employer]  Tobacco Use   Smoking status: Some Days    Current packs/day: 0.50    Types: Cigarettes   Smokeless tobacco: Never  Vaping Use   Vaping status: Never Used  Substance and Sexual Activity   Alcohol  use: Yes    Alcohol /week: 12.0 standard drinks of alcohol     Types: 12 Cans of beer per week   Drug use: Yes    Types: Crack cocaine, Cocaine    Comment: one gram daily   Sexual activity: Yes  Other Topics Concern   Not on file  Social History Narrative   The patient was born and raised in with that by both his biological parents. His father's passed away but his mother still living. He denies any history of any physical or sexual abuse. He says he completed 2 years of college at [employer]. 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 He has never been married but has a 37 year old son who lives with his mother. He says he does get to see his son and has a relationship with him. He is not currently dating or in a relationship.      The patient does have a history of a DUI and had a court date last Friday for a DUI. He denies any other pending charges.            Social Drivers of Corporate investment banker Strain: Not on file  Food Insecurity: Food Insecurity Present (06/08/2023)   Hunger Vital Sign    Worried About Running Out of Food in the Last Year: Sometimes true    Ran Out of Food in the Last Year: Sometimes true  Transportation Needs: Unmet Transportation Needs (06/08/2023)   PRAPARE - Doctor, general practice (Medical): Yes    Lack of Transportation (Non-Medical): Yes  Physical Activity: Not on file  Stress: Not on file  Social Connections: Unknown (05/16/2023)   Social Connection and Isolation Panel    Frequency of Communication with Friends and Family: Patient declined    Frequency of Social Gatherings with Friends and Family: Patient declined    Attends Religious Services: Patient declined    Database administrator or Organizations: Patient declined    Attends Engineer, structural: Patient declined    Marital Status: Never married   Social History   Tobacco Use  Smoking Status Some Days   Current packs/day: 0.50   Types: Cigarettes  Smokeless Tobacco Never   Social History   Substance and Sexual Activity  Alcohol  Use Yes   Alcohol /week: 12.0 standard drinks of alcohol    Types: 12 Cans of beer per week   Social History   Substance and Sexual Activity  Drug Use Yes   Types: Crack cocaine, Cocaine   Comment: one gram daily    Additional pertinent information:  Reports being homeless for 6 months  Substance abuse as above.  FAMILY HISTORY  Family History  Problem Relation Age of Onset   Hypertension Other    Diabetes Other    Family Psychiatric History (if known):  denies  MENTAL STATUS EXAM (MSE)  Mental Status Exam: General Appearance: hospital scrubs; NAD  Orientation:  Full (Time, Place, and Person)  Memory:  no apparent deficits  Concentration:  fair  Recall:  fair  Attention  good  Eye Contact:  good  Speech:  WNL  Language:  fluent English  Volume:  normal  Mood: depressed, suicidal - patient  Affect:  Congruent  Thought Process:  Linear  Thought Content:  themes of worthlessness, hopelessness  Suicidal Thoughts:  Yes.  with intent/plan  Homicidal Thoughts:  No  Judgement:  limited  Insight:  limited  Psychomotor Activity:  WNL  Akathisia:  none evident  Fund of Knowledge:  fair     VITALS  Blood pressure (!)  164/93, pulse 100, temperature 97.9 F (36.6 C), resp. rate 20, height 5' 10 (1.778 m), weight 74.8 kg, SpO2 93%.  LABS  Admission on 07/09/2023  Component Date Value Ref Range Status   WBC 07/09/2023 7.4  4.0 - 10.5 K/uL Final   RBC 07/09/2023 5.07  4.22 - 5.81 MIL/uL Final   Hemoglobin 07/09/2023 15.2  13.0 - 17.0 g/dL Final   HCT 92/94/7974 44.9  39.0 - 52.0 % Final   MCV 07/09/2023 88.6  80.0 - 100.0 fL Final   MCH 07/09/2023 30.0  26.0 - 34.0 pg Final   MCHC 07/09/2023 33.9  30.0 - 36.0 g/dL Final   RDW 92/94/7974 14.8  11.5 - 15.5 % Final   Platelets 07/09/2023 328  150 - 400 K/uL Final   nRBC 07/09/2023 0.0  0.0 - 0.2 % Final   Performed at [facility], [address] [place], [place] [address]   Sodium 07/09/2023 138  135 - 145 mmol/L Final   Potassium 07/09/2023 3.8  3.5 - 5.1 mmol/L Final   Chloride 07/09/2023 102  98 - 111 mmol/L Final   CO2 07/09/2023 23  22 - 32 mmol/L Final   Glucose, Bld 07/09/2023 264 (H)  70 - 99 mg/dL Final   Glucose reference range applies only to samples taken after fasting for at least 8 hours.   BUN 07/09/2023 19  6 - 20 mg/dL Final   Creatinine, Ser 07/09/2023 1.12  0.61 - 1.24 mg/dL Final   Calcium 92/94/7974 9.5  8.9 - 10.3 mg/dL Final   Total Protein 92/94/7974 7.4  6.5 - 8.1 g/dL Final   Albumin 92/94/7974 4.2  3.5 - 5.0 g/dL Final   AST 92/94/7974 24  15 - 41 U/L Final   ALT 07/09/2023 21  0 - 44 U/L Final   Alkaline Phosphatase 07/09/2023 68  38 - 126 U/L Final   Total Bilirubin 07/09/2023 1.2  0.0 - 1.2 mg/dL Final   GFR, Estimated 07/09/2023 >60  >60 mL/min Final   Comment: (NOTE) Calculated using the CKD-EPI Creatinine Equation (2021)    Anion gap 07/09/2023 13  5 - 15 Final   Performed at [facility], [address] [place], [place] [address]   Troponin I (High Sensitivity) 07/09/2023 3  <18 ng/L Final   Comment: (NOTE) Elevated high sensitivity troponin I (hsTnI) values and significant  changes across  serial measurements may suggest ACS but many other  chronic and acute conditions are known to elevate hsTnI results.  Refer to the Links section for chest pain algorithms and additional  guidance. Performed at [facility], [address] [place], [place] [address]    B Natriuretic Peptide 07/09/2023 28.6  0.0 - 100.0 pg/mL Final   Performed at [facility], [address] [place], [place] [address]    PSYCHIATRIC REVIEW OF SYSTEMS (ROS)  ROS: Notable for the following relevant positive findings: ROS - depressed mood, hopelessness, worthlessness, anhedonia, anergia, suicidal ideation, suicidal plan  Additional findings:      Musculoskeletal: No abnormal movements observed      Gait & Station: Normal      Pain Screening: Denies      Nutrition & Dental Concerns: Decrease in food intake and/or loss of appetite  RISK FORMULATION/ASSESSMENT  Is the patient experiencing any suicidal or homicidal ideations: Yes       Explain if yes: SI, plan, reports attempt interrupted by police Protective factors considered for safety management: help-seeking; prior treatment response  Risk factors/concerns considered for safety management:  Prior attempt Depression Substance abuse/dependence Isolation Male gender  Is there a safety management plan with the patient and treatment team to minimize risk factors and promote protective factors: Yes           Explain: meds, psych admission Is crisis care placement or psychiatric hospitalization recommended: Yes     Based on my current evaluation and risk assessment, patient is determined at this time to be at:  High risk  *RISK ASSESSMENT Risk assessment is a dynamic process; it  is possible that this patient's condition, and risk level, may change. This should be re-evaluated and managed over time as appropriate. Please re-consult psychiatric consult services if additional assistance is needed in terms of risk assessment and  management. If your team decides to discharge this patient, please advise the patient how to best access emergency psychiatric services, or to call 911, if their condition worsens or they feel unsafe in any way.   Nancyann LITTIE Alert, MD Telepsychiatry Consult Services

## 2023-07-10 DIAGNOSIS — F333 Major depressive disorder, recurrent, severe with psychotic symptoms: Secondary | ICD-10-CM

## 2023-07-10 DIAGNOSIS — F332 Major depressive disorder, recurrent severe without psychotic features: Secondary | ICD-10-CM

## 2023-07-10 NOTE — Plan of Care (Signed)
  Problem: Activity: Goal: Interest or engagement in leisure activities will improve Outcome: Not Progressing Goal: Imbalance in normal sleep/wake cycle will improve Outcome: Not Progressing   Problem: Coping: Goal: Will verbalize feelings Outcome: Not Progressing

## 2023-07-10 NOTE — Progress Notes (Signed)
 Patient remained mostly isolative to his room throughout shift. He did not participate in group but did present for meals. He denies suicidal ideations, and auditory/visual hallucinations. Patient took scheduled medications without difficulty.

## 2023-07-10 NOTE — Plan of Care (Signed)
  Problem: Activity: Goal: Interest or engagement in leisure activities will improve Outcome: Not Progressing Goal: Imbalance in normal sleep/wake cycle will improve Outcome: Not Progressing   Problem: Coping: Goal: Coping ability will improve Outcome: Not Progressing Goal: Will verbalize feelings Outcome: Progressing

## 2023-07-10 NOTE — H&P (Signed)
 Psychiatric Admission Assessment Adult  Patient Identification: Franklin [name] MRN:  [id number] Date of Evaluation:  07/10/2023 Chief Complaint:  MDD (major depressive disorder), recurrent episode, severe (HCC) [F33.2]   History of Present Illness: Patient is a 53 year old man with history of MDD, PTSD; alcohol  use disorder,stimulant use disorder whom presented to ED with c/o active suicidal ideation with plan to jump off a bridge. Patient is admitted to [facility] unit with Q15 min safety monitoring. Multidisciplinary team approach is offered. Medication management; group/milieu therapy is offered.   On interview patient is noted to be sleeping in bed.  He engaged in interview with the provider with eyes closed.  He reports feeling depressed and having suicidal ideation for the last few days.  He endorses feeling hopeless and worthless with low energy, low motivation, poor appetite and sleep.  He denies any changes in his weight.  When asked about the stressors he reports his addiction to drugs and alcohol  are making his depression worse.  He endorses worsening anxiety and panic attacks.  He denies having any history of abuse and denies ongoing nightmares and flashbacks.  He denies auditory/visual hallucinations.  He denies current or recent symptoms of mania/hypomania.  Per ED note patient is requesting for 30-day rehab after stabilization of his mood.  He is unable to recall his previous medication trial but currently agrees to take Prozac  and Abilify  that was started in the emergency room.  He reports currently being homeless and not having any family members as also another stressor.  He reports history of alcohol  use for many years and use of crack cocaine on daily basis.  Total Time spent with patient: 1 hour Sleep  Sleep:Sleep: Fair  Past Psychiatric History:  Psychiatric History:  Information collected from Patient/ chart  Prev Dx/Sx: Alcohol  use/stimulant use disorder Current Psych  Provider: None reported Home Meds (current): None reported Previous Med Trials: Unknown Therapy: None reported  Prior Psych Hospitalization: Multiple times Prior Self Harm: Reports sitting on a bridge as a suicidal gesture few weeks ago Prior Violence: Denies  Family Psych History: History of alcohol  drug use in family members and unknown mental health problems Family Hx suicide: Denies  Social History:  Educational Hx: 3 years of college 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 Yes.    Has the patient been a risk to self in the past 6 months? No.  Has the patient been a risk to self within the distant past? No.  Is the patient a risk to others? No.  Has the patient been a risk to others in the past 6 months? No.  Has the patient been a risk to others within the distant past? No.   [place] Scale:  Flowsheet Row Admission (Current) from 07/09/2023 in [facility] BEHAVIORAL MEDICINE Most recent reading at 07/09/2023  2:51 PM ED from 07/09/2023 in [facility] Emergency Department at [facility] Most recent reading at 07/09/2023  7:57 AM ED from 07/09/2023 in [facility] Emergency Department at [facility] Most recent reading at 07/09/2023 12:52 AM  C-SSRS RISK CATEGORY High Risk High  Risk No Risk     Past Medical History:  Past Medical History:  Diagnosis Date   A-fib (HCC)    Anxiety    COPD (chronic obstructive pulmonary disease) (HCC)    Depression    PTSD (post-traumatic stress disorder) 06/11/2023   Substance abuse (HCC)     Vitamin D  insufficiency 06/12/2023    Past Surgical History:  Procedure Laterality Date   NO PAST SURGERIES     Family History:  Family History  Problem Relation Age of Onset   Hypertension Other    Diabetes Other     Social History:  Social History   Substance and Sexual Activity  Alcohol  Use Yes   Alcohol /week: 12.0 standard drinks of alcohol    Types: 12 Cans of beer per week     Social History   Substance and Sexual Activity  Drug Use Yes   Types: Crack cocaine, Cocaine   Comment: one gram daily      Allergies:  No Known Allergies Lab Results:  Results for orders placed or performed during the hospital encounter of 07/09/23 (from the past 48 hours)  CBC     Status: None   Collection Time: 07/09/23  8:30 AM  Result Value Ref Range   WBC 7.4 4.0 - 10.5 K/uL   RBC 5.07 4.22 - 5.81 MIL/uL   Hemoglobin 15.2 13.0 - 17.0 g/dL   HCT 55.0 60.9 - 47.9 %   MCV 88.6 80.0 - 100.0 fL   MCH 30.0 26.0 - 34.0 pg   MCHC 33.9 30.0 - 36.0 g/dL   RDW 85.1 88.4 - 84.4 %   Platelets 328 150 - 400 K/uL   nRBC 0.0 0.0 - 0.2 %    Comment: Performed at [facility], [address] [place], [place] [address]  Comprehensive metabolic panel     Status: Abnormal   Collection Time: 07/09/23  8:30 AM  Result Value Ref Range   Sodium 138 135 - 145 mmol/L   Potassium 3.8 3.5 - 5.1 mmol/L   Chloride 102 98 - 111 mmol/L   CO2 23 22 - 32 mmol/L   Glucose, Bld 264 (H) 70 - 99 mg/dL    Comment: Glucose reference range applies only to samples taken after fasting for at least 8 hours.   BUN 19 6 - 20 mg/dL   Creatinine, Ser 8.87 0.61 - 1.24 mg/dL   Calcium 9.5 8.9 - 89.6 mg/dL   Total Protein 7.4 6.5 - 8.1 g/dL   Albumin 4.2 3.5 - 5.0 g/dL   AST 24 15 - 41 U/L   ALT 21 0 - 44 U/L   Alkaline Phosphatase 68 38 - 126 U/L   Total Bilirubin 1.2 0.0 - 1.2 mg/dL   GFR, Estimated >39 >39 mL/min    Comment: (NOTE) Calculated using the CKD-EPI Creatinine Equation (2021)    Anion  gap 13 5 - 15    Comment: Performed at [facility], [address], [place], [place] [address]  Troponin I (High Sensitivity)     Status: None   Collection Time: 07/09/23  8:30 AM  Result Value Ref Range   Troponin I (High Sensitivity) 3 <18 ng/L    Comment: (NOTE) Elevated high sensitivity troponin I (hsTnI) values and significant  changes across serial measurements may suggest ACS but many other  chronic and acute conditions are known to elevate hsTnI results.  Refer to the Links section for chest pain algorithms and additional  guidance. Performed at [facility], [address] [place], [place] [address]   Brain natriuretic peptide     Status: None   Collection Time: 07/09/23  8:30 AM  Result Value Ref Range   B Natriuretic Peptide 28.6 0.0 - 100.0 pg/mL    Comment: Performed at [facility], [address] [place], [place] [address]  Urine Drug Screen, Qualitative ([facility] only)     Status: Abnormal   Collection Time: 07/09/23 12:30 PM  Result Value Ref Range   Tricyclic, Ur Screen NONE DETECTED NONE DETECTED   Amphetamines, Ur Screen NONE DETECTED NONE DETECTED   MDMA (Ecstasy)Ur Screen NONE DETECTED NONE DETECTED   Cocaine Metabolite,Ur [place] POSITIVE (A) NONE DETECTED   Opiate, Ur Screen NONE DETECTED NONE DETECTED   Phencyclidine (PCP) Ur S NONE DETECTED NONE DETECTED   Cannabinoid 50 Ng, Ur  POSITIVE (A) NONE DETECTED   Barbiturates, Ur Screen NONE DETECTED NONE DETECTED   Benzodiazepine, Ur Scrn NONE DETECTED NONE DETECTED   Methadone Scn, Ur NONE DETECTED NONE DETECTED    Comment: (NOTE) Tricyclics + metabolites, urine    Cutoff 1000 ng/mL Amphetamines + metabolites, urine  Cutoff 1000 ng/mL MDMA (Ecstasy), urine              Cutoff 500 ng/mL Cocaine Metabolite, urine          Cutoff 300 ng/mL Opiate + metabolites, urine        Cutoff 300 ng/mL Phencyclidine (PCP), urine         Cutoff 25 ng/mL Cannabinoid, urine                  Cutoff 50 ng/mL Barbiturates + metabolites, urine  Cutoff 200 ng/mL Benzodiazepine, urine              Cutoff 200 ng/mL Methadone, urine                   Cutoff 300 ng/mL  The urine drug screen provides only a preliminary, unconfirmed analytical test result and should not be used for non-medical purposes. Clinical consideration and professional judgment should be applied to any positive drug screen result due to possible interfering substances. A more specific alternate chemical method must be used in order to obtain a confirmed analytical result. Gas chromatography / mass spectrometry (GC/MS) is the preferred confirm atory method. Performed at [facility], [address] [place], [place] [address]   Urinalysis, Routine w reflex microscopic -Urine, Clean Catch     Status: Abnormal   Collection Time: 07/09/23 12:30 PM  Result Value Ref Range   Color, Urine YELLOW YELLOW   APPearance TURBID (A) CLEAR   Specific Gravity, Urine 1.030 1.005 - 1.030   pH 5.0 5.0 - 8.0   Glucose, UA >=500 (A) NEGATIVE mg/dL   Hgb urine dipstick NEGATIVE NEGATIVE   Bilirubin Urine NEGATIVE NEGATIVE   Ketones, ur 80 (A) NEGATIVE mg/dL   Protein, ur 30 (A) NEGATIVE mg/dL   Nitrite NEGATIVE NEGATIVE   Leukocytes,Ua NEGATIVE NEGATIVE   RBC / HPF 0-5 0 - 5 RBC/hpf   WBC, UA 0-5 0 - 5 WBC/hpf   Bacteria, UA NONE SEEN NONE SEEN   Squamous Epithelial / HPF 0 0 - 5 /HPF   Mucus PRESENT    Hyaline Casts, UA PRESENT    Uric Acid Crys, UA PRESENT     Comment: Performed at [facility], [address] [place], [place] [address]  Resp panel by RT-PCR (RSV, Flu A&B, Covid)  Anterior Nasal Swab     Status: None   Collection Time: 07/09/23 12:48 PM   Specimen: Anterior Nasal Swab  Result Value Ref Range   SARS Coronavirus 2 by RT PCR NEGATIVE NEGATIVE    Comment: (NOTE) SARS-CoV-2 target nucleic acids are NOT DETECTED.  The SARS-CoV-2 RNA is generally detectable in upper  respiratory specimens during the acute phase of infection. The lowest concentration of SARS-CoV-2 viral copies this assay can detect is 138 copies/mL. A negative result does not preclude SARS-Cov-2 infection and should not be used as the sole basis for treatment or other patient management decisions. A negative result may occur with  improper specimen collection/handling, submission of specimen other than nasopharyngeal swab, presence of viral mutation(s) within the areas targeted by this assay, and inadequate number of viral copies(<138 copies/mL). A negative result must be combined with clinical observations, patient history, and epidemiological information. The expected result is Negative.  Fact Sheet for Patients:  BloggerCourse.com  Fact Sheet for Healthcare Providers:  SeriousBroker.it  This test is no t yet approved or cleared by the [place]  FDA and  has been authorized for detection and/or diagnosis of SARS-CoV-2 by FDA under an Emergency Use Authorization (EUA). This EUA will remain  in effect (meaning this test can be used) for the duration of the COVID-19 declaration under Section 564(b)(1) of the Act, 21 U.S.C.section 360bbb-3(b)(1), unless the authorization is terminated  or revoked sooner.       Influenza A by PCR NEGATIVE NEGATIVE   Influenza B by PCR NEGATIVE NEGATIVE    Comment: (NOTE) The Xpert Xpress SARS-CoV-2/FLU/RSV plus assay is intended as an aid in the diagnosis of influenza from Nasopharyngeal swab specimens and should not be used as a sole basis for treatment. Nasal washings and aspirates are unacceptable for Xpert Xpress SARS-CoV-2/FLU/RSV testing.  Fact Sheet for Patients: BloggerCourse.com  Fact Sheet for Healthcare Providers: SeriousBroker.it  This test is not yet approved or cleared by the [place]  FDA and has been authorized for  detection and/or diagnosis of SARS-CoV-2 by FDA under an Emergency Use Authorization (EUA). This EUA will remain in effect (meaning this test can be used) for the duration of the COVID-19 declaration under Section 564(b)(1) of the Act, 21 U.S.C. section 360bbb-3(b)(1), unless the authorization is terminated or revoked.     Resp Syncytial Virus by PCR NEGATIVE NEGATIVE    Comment: (NOTE) Fact Sheet for Patients: BloggerCourse.com  Fact Sheet for Healthcare Providers: SeriousBroker.it  This test is not yet approved or cleared by the [place]  FDA and has been authorized for detection and/or diagnosis of SARS-CoV-2 by FDA under an Emergency Use Authorization (EUA). This EUA will remain in effect (meaning this test can be used) for the duration of the COVID-19 declaration under Section 564(b)(1) of the Act, 21 U.S.C. section 360bbb-3(b)(1), unless the authorization is terminated or revoked.  Performed at [facility], [address] [place], [place] [address]     Blood Alcohol  level:  Lab Results  Component Value Date   [facility] <15 06/07/2023   ETH <10 02/27/2023    Metabolic Disorder Labs:  Lab Results  Component Value Date   HGBA1C 6.3 (H) 06/11/2023   MPG 134 06/11/2023   MPG 114.02 03/05/2023   No results found for: PROLACTIN Lab Results  Component Value Date   CHOL 184 06/11/2023   TRIG 98 06/11/2023   HDL 50 06/11/2023   CHOLHDL 3.7 06/11/2023   VLDL 20 06/11/2023   LDLCALC 114 (H)  06/11/2023   LDLCALC 105 (H) 03/05/2023    Current Medications: Current Facility-Administered Medications  Medication Dose Route Frequency Provider Last Rate Last Admin   acetaminophen  (TYLENOL ) tablet 650 mg  650 mg Oral Q6H PRN Brent, Amanda C, NP       alum & mag hydroxide-simeth (MAALOX/MYLANTA) 200-200-20 MG/5ML suspension 30 mL  30 mL Oral Q4H PRN Brent, Amanda C, NP       ARIPiprazole  (ABILIFY ) tablet 10 mg   10 mg Oral Daily Brent, Amanda C, NP   10 mg at 07/10/23 1015   haloperidol  (HALDOL ) tablet 5 mg  5 mg Oral TID PRN Brent, Amanda C, NP       And   diphenhydrAMINE  (BENADRYL ) capsule 50 mg  50 mg Oral TID PRN Brent, Amanda C, NP       haloperidol  lactate (HALDOL ) injection 5 mg  5 mg Intramuscular TID PRN Brent, Amanda C, NP       And   diphenhydrAMINE  (BENADRYL ) injection 50 mg  50 mg Intramuscular TID PRN Brent, Amanda C, NP       And   LORazepam  (ATIVAN ) injection 2 mg  2 mg Intramuscular TID PRN Brent, Amanda C, NP       haloperidol  lactate (HALDOL ) injection 10 mg  10 mg Intramuscular TID PRN Thresa Alan BROCKS, NP       And   diphenhydrAMINE  (BENADRYL ) injection 50 mg  50 mg Intramuscular TID PRN Brent, Amanda C, NP       And   LORazepam  (ATIVAN ) injection 2 mg  2 mg Intramuscular TID PRN Brent, Amanda C, NP       FLUoxetine  (PROZAC ) capsule 20 mg  20 mg Oral Daily Brent, Amanda C, NP   20 mg at 07/10/23 1015   hydrOXYzine  (ATARAX ) tablet 25 mg  25 mg Oral TID PRN Brent, Amanda C, NP       magnesium  hydroxide (MILK OF MAGNESIA) suspension 30 mL  30 mL Oral Daily PRN Brent, Amanda C, NP       traZODone  (DESYREL ) tablet 50 mg  50 mg Oral QHS PRN Brent, Amanda C, NP       PTA Medications: Medications Prior to Admission  Medication Sig Dispense Refill Last Dose/Taking   albuterol  (VENTOLIN  HFA) 108 (90 Base) MCG/ACT inhaler Inhale 2 puffs into the lungs every 6 (six) hours as needed for wheezing or shortness of breath. 8 g 0    ARIPiprazole  (ABILIFY ) 10 MG tablet Take 1 tablet (10 mg total) by mouth daily. (Patient not taking: Reported on 07/09/2023) 30 tablet 0    doxycycline  (VIBRAMYCIN ) 100 MG capsule Take 1 capsule (100 mg total) by mouth 2 (two) times daily for 7 days. (Patient not taking: Reported on 07/09/2023) 14 capsule 0    FLUoxetine  (PROZAC ) 20 MG capsule Take 1 capsule (20 mg total) by mouth daily. (Patient not taking: Reported on 07/09/2023) 30 capsule 0    fluticasone   furoate-vilanterol (BREO ELLIPTA ) 100-25 MCG/ACT AEPB Inhale 1 puff into the lungs daily. 30 each 0    gabapentin  (NEURONTIN ) 300 MG capsule Take 1 capsule (300 mg total) by mouth 3 (three) times daily. 90 capsule 0    ipratropium-albuterol  (DUONEB) 0.5-2.5 (3) MG/3ML SOLN Take 3 mLs by nebulization every 6 (six) hours as needed. (Patient taking differently: Take 3 mLs by nebulization every 6 (six) hours as needed (For shortness of breath).) 360 mL 0    mometasone -formoterol  (DULERA ) 200-5 MCG/ACT AERO Inhale 2 puffs into the lungs 2 (  two) times daily in the am and at bedtime.. (Patient not taking: Reported on 07/09/2023)      Multiple Vitamin (MULTIVITAMIN WITH MINERALS) TABS tablet Take 1 tablet by mouth daily.      naltrexone  (DEPADE) 50 MG tablet Take 1 tablet (50 mg total) by mouth at bedtime. 30 tablet 0    propranolol  (INDERAL ) 10 MG tablet Take 1 tablet (10 mg total) by mouth 2 (two) times daily. 60 tablet 0    thiamine  (VITAMIN B-1) 100 MG tablet Take 1 tablet (100 mg total) by mouth daily.      traZODone  (DESYREL ) 50 MG tablet Take 1 tablet (50 mg total) by mouth at bedtime as needed for sleep. 30 tablet 0    vitamin D3 (CHOLECALCIFEROL ) 25 MCG tablet Take 1 tablet (1,000 Units total) by mouth daily with breakfast. 30 tablet 0     Psychiatric Specialty Exam:  Presentation  General Appearance:  Appropriate for Environment; Casual  Eye Contact: Minimal  Speech: Normal Rate  Speech Volume: Normal    Mood and Affect  Mood: Depressed; Dysphoric  Affect: Depressed; Flat   Thought Process  Thought Processes: Coherent  Descriptions of Associations:Intact  Orientation:Full (Time, Place and Person)  Thought Content:Illogical  Hallucinations:Hallucinations: None  Ideas of Reference:None  Suicidal Thoughts:Suicidal Thoughts: Yes, Passive SI Passive Intent and/or Plan: Without Intent; Without Plan  Homicidal Thoughts:Homicidal Thoughts: No   Sensorium   Memory: Recent Fair; Immediate Fair; Remote Fair  Judgment: Impaired  Insight: Shallow   Executive Functions  Concentration: Fair  Attention Span: Fair  Recall: [employer] of Knowledge: Fair  Language: Fair   Psychomotor Activity  Psychomotor Activity: Psychomotor Activity: Normal   Assets  Assets: Communication Skills; Desire for Improvement; Resilience    Musculoskeletal: Strength & Muscle Tone: within normal limits Gait & Station: normal  Physical Exam: Physical Exam Vitals and nursing note reviewed.  HENT:     Head: Normocephalic.     Nose: Nose normal.  Pulmonary:     Effort: Pulmonary effort is normal.  Neurological:     Mental Status: He is alert.    Review of Systems  Constitutional: Negative.   HENT: Negative.    Eyes: Negative.   Respiratory: Negative.    Cardiovascular: Negative.   Skin: Negative.    Blood pressure (!) 143/81, pulse 67, temperature 98.1 F (36.7 C), resp. rate 20, height 5' 10 (1.778 m), weight 76 kg, SpO2 96%. Body mass index is 24.03 kg/m.  Principal Diagnosis: MDD (major depressive disorder), recurrent episode, severe (HCC) Diagnosis:  Principal Problem:   MDD (major depressive disorder), recurrent episode, severe (HCC)   Clinical Decision Making: Patient with history of depression, alcohol  use, stimulant use disorder is currently admitted for worsening depression and suicidal ideation in the context of multiple psychosocial stressors including homelessness and chronic substance use.  Treatment Plan Summary:  Safety and Monitoring:             -- Voluntary admission to inpatient psychiatric unit for safety, stabilization and treatment             -- Daily contact with patient to assess and evaluate symptoms and progress in treatment             -- Patient's case to be discussed in multi-disciplinary team meeting             -- Observation Level: q15 minute checks             -- Vital signs:  q12 hours              -- Precautions: suicide, elopement, and assault   2. Psychiatric Diagnoses and Treatment:              Continue Prozac  20 mg daily and Abilify  10 mg daily     -- The risks/benefits/side-effects/alternatives to this medication were discussed in detail with the patient and time was given for questions. The patient consents to medication trial.                -- Metabolic profile and EKG monitoring obtained while on an atypical antipsychotic (BMI: Lipid Panel: HbgA1c: QTc:)              -- Encouraged patient to participate in unit milieu and in scheduled group therapies                            3. Medical Issues Being Addressed:    No acute medical concerns noted at this time. 4. Discharge Planning:              -- Social work and case management to assist with discharge planning and identification of hospital follow-up needs prior to discharge             -- Estimated LOS: 5-7 days             -- Discharge Concerns: Need to establish a safety plan; Medication compliance and effectiveness             -- Discharge Goals: Return home with outpatient referrals follow ups  Physician Treatment Plan for Primary Diagnosis: MDD (major depressive disorder), recurrent episode, severe (HCC) Long Term Goal(s): Improvement in symptoms so as ready for discharge  Short Term Goals: Ability to identify changes in lifestyle to reduce recurrence of condition will improve, Ability to verbalize feelings will improve, Ability to disclose and discuss suicidal ideas, Ability to demonstrate self-control will improve, and Ability to identify and develop effective coping behaviors will improve  Physician Treatment Plan for Secondary Diagnosis: Principal Problem:   MDD (major depressive disorder), recurrent episode, severe (HCC)  Long Term Goal(s): Improvement in symptoms so as ready for discharge  Short Term Goals: Ability to identify changes in lifestyle to reduce recurrence of condition will improve,  Ability to verbalize feelings will improve, Ability to disclose and discuss suicidal ideas, Ability to demonstrate self-control will improve, Ability to identify and develop effective coping behaviors will improve, Ability to maintain clinical measurements within normal limits will improve, and Ability to identify triggers associated with substance abuse/mental health issues will improve  I certify that inpatient services furnished can reasonably be expected to improve the patient's condition.    Angel Hobdy, MD 7/6/202511:25 AM

## 2023-07-10 NOTE — Group Note (Signed)
 LCSW Group Therapy Note  Group Date: 07/10/2023 Start Time: 1400 End Time: 1445   Type of Therapy and Topic:  Group Therapy - Healthy vs Unhealthy Coping Skills  Participation Level:  Did Not Attend   Description of Group The focus of this group was to determine what unhealthy coping techniques typically are used by group members and what healthy coping techniques would be helpful in coping with various problems. Patients were guided in becoming aware of the differences between healthy and unhealthy coping techniques. Patients were asked to identify 2-3 healthy coping skills they would like to learn to use more effectively.  Therapeutic Goals Patients learned that coping is what human beings do all day long to deal with various situations in their lives Patients defined and discussed healthy vs unhealthy coping techniques Patients identified their preferred coping techniques and identified whether these were healthy or unhealthy Patients determined 2-3 healthy coping skills they would like to become more familiar with and use more often. Patients provided support and ideas to each other   Summary of Patient Progress:  Patient did not attend group.   Therapeutic Modalities Cognitive Behavioral Therapy Motivational Interviewing  Franklin [name] [place], [place] 07/10/2023  3:14 PM

## 2023-07-10 NOTE — Progress Notes (Addendum)
 Patient is alert and oriented and currently resting in bed.  He currently denies any report of suicidal thoughts but endorses anxiety and depression. Patient is calm and short in his responses.   07/10/23 1015  Psych Admission Type (Psych Patients Only)  Admission Status Voluntary  Psychosocial Assessment  Patient Complaints Anxiety;Depression  Eye Contact Brief  Facial Expression Flat  Affect Flat  Speech Logical/coherent  Interaction Minimal  Motor Activity Slow  Appearance/Hygiene In scrubs  Behavior Characteristics Calm;Appropriate to situation  Mood Sad  Thought Process  Coherency WDL  Content WDL  Delusions None reported or observed  Perception WDL  Hallucination None reported or observed  Judgment Impaired  Confusion None  Danger to Self  Current suicidal ideation? Denies  Danger to Others  Danger to Others None reported or observed

## 2023-07-10 NOTE — Group Note (Signed)
 Date:  07/10/2023 Time:  11:08 AM  Group Topic/Focus:  Goals Group:   The focus of this group is to help patients establish daily goals to achieve during treatment and discuss how the patient can incorporate goal setting into their daily lives to aide in recovery.    Participation Level:  Did Not Attend  Arland Nutting 07/10/2023, 11:08 AM

## 2023-07-10 NOTE — BHH Suicide Risk Assessment (Signed)
 [facility] Admission Suicide Risk Assessment   Nursing information obtained from:  Patient Demographic factors:  Low socioeconomic status, Living alone, Unemployed Current Mental Status:  Suicidal ideation indicated by patient Loss Factors:  Financial problems / change in socioeconomic status, Decrease in vocational status Historical Factors:  NA Risk Reduction Factors:  Sense of responsibility to family  Total Time spent with patient: 30 minutes Principal Problem: MDD (major depressive disorder), recurrent episode, severe (HCC) Diagnosis:  Principal Problem:   MDD (major depressive disorder), recurrent episode, severe (HCC)  Subjective Data: patient is a 53 year old man with history of MDD, PTSD;alcohol  use disorder,stimulant use disorder whom presented to ED with c/o active suicidal ideation with plan to jump off a bridge. Patient is admitted to [facility] unit with Q15 min safety monitoring. Multidisciplinary team approach is offered. Medication management; group/milieu therapy is offered.   Continued Clinical Symptoms:  Alcohol  Use Disorder Identification Test Final Score (AUDIT): 29 The Alcohol  Use Disorders Identification Test, Guidelines for Use in Primary Care, Second Edition.  World Science writer [facility]). Score between 0-7:  no or low risk or alcohol  related problems. Score between 8-15:  moderate risk of alcohol  related problems. Score between 16-19:  high risk of alcohol  related problems. Score 20 or above:  warrants further diagnostic evaluation for alcohol  dependence and treatment.   CLINICAL FACTORS:   Depression:   Comorbid alcohol  abuse/dependence   Musculoskeletal: Strength & Muscle Tone: within normal limits Gait & Station: normal Patient leans: N/A  Psychiatric Specialty Exam:  Presentation  General Appearance:  Appropriate for Environment; Casual  Eye Contact: Minimal  Speech: Normal Rate  Speech Volume: Normal  Handedness: Right   Mood and  Affect  Mood: Depressed; Dysphoric  Affect: Depressed; Flat   Thought Process  Thought Processes: Coherent  Descriptions of Associations:Intact  Orientation:Full (Time, Place and Person)  Thought Content:Illogical  History of Schizophrenia/Schizoaffective disorder:No  Duration of Psychotic Symptoms:Less than six months  Hallucinations:Hallucinations: None  Ideas of Reference:None  Suicidal Thoughts:Suicidal Thoughts: Yes, Passive SI Passive Intent and/or Plan: Without Intent; Without Plan  Homicidal Thoughts:Homicidal Thoughts: No   Sensorium  Memory: Recent Fair; Immediate Fair; Remote Fair  Judgment: Impaired  Insight: Shallow   Executive Functions  Concentration: Fair  Attention Span: Fair  Recall: [employer] of Knowledge: Fair  Language: Fair   Psychomotor Activity  Psychomotor Activity: Psychomotor Activity: Normal   Assets  Assets: Communication Skills; Desire for Improvement; Resilience   Sleep  Sleep: Sleep: Fair    Physical Exam: Physical Exam ROS Blood pressure (!) 143/81, pulse 67, temperature 98.1 F (36.7 C), resp. rate 20, height 5' 10 (1.778 m), weight 76 kg, SpO2 96%. Body mass index is 24.03 kg/m.   COGNITIVE FEATURES THAT CONTRIBUTE TO RISK:  None    SUICIDE RISK:   Mild:  Suicidal ideation of limited frequency, intensity, duration, and specificity.  There are no identifiable plans, no associated intent, mild dysphoria and related symptoms, good self-control (both objective and subjective assessment), few other risk factors, and identifiable protective factors, including available and accessible social support.  PLAN OF CARE: Patient is admitted to [facility] psych unit with Q15 min safety monitoring. Multidisciplinary team approach is offered. Medication management; group/milieu therapy is offered.   I certify that inpatient services furnished can reasonably be expected to improve the patient's condition.    Allyn Foil, MD 07/10/2023, 11:23 AM

## 2023-07-10 NOTE — Group Note (Signed)
 Date:  07/10/2023 Time:  9:30 PM  Group Topic/Focus:  Wrap-Up Group:   The focus of this group is to help patients review their daily goal of treatment and discuss progress on daily workbooks.    Participation Level:  Did Not Attend  Participation Quality:     Affect:     Cognitive:     Insight: None  Engagement in Group:  None  Modes of Intervention:     Additional Comments:    Tommas CHRISTELLA Bunker 07/10/2023, 9:30 PM

## 2023-07-11 MED ORDER — ALBUTEROL SULFATE HFA 108 (90 BASE) MCG/ACT IN AERS
1.0000 | INHALATION_SPRAY | Freq: Four times a day (QID) | RESPIRATORY_TRACT | Status: DC | PRN
  Administered 2023-07-15: 1 via RESPIRATORY_TRACT
  Filled 2023-07-11: qty 6.7

## 2023-07-11 MED ORDER — ARIPIPRAZOLE 10 MG PO TABS
10.0000 mg | ORAL_TABLET | Freq: Every day | ORAL | Status: DC
  Administered 2023-07-11 – 2023-07-18 (×8): 10 mg via ORAL
  Filled 2023-07-11: qty 1
  Filled 2023-07-11: qty 2
  Filled 2023-07-11: qty 1
  Filled 2023-07-11: qty 2
  Filled 2023-07-11: qty 1
  Filled 2023-07-11 (×2): qty 2
  Filled 2023-07-11 (×2): qty 1
  Filled 2023-07-11: qty 2
  Filled 2023-07-11 (×2): qty 1
  Filled 2023-07-11: qty 2
  Filled 2023-07-11: qty 1

## 2023-07-11 NOTE — Progress Notes (Signed)
   07/10/23 2200  Psych Admission Type (Psych Patients Only)  Admission Status Voluntary  Psychosocial Assessment  Patient Complaints None  Eye Contact Brief  Facial Expression Flat  Affect Flat  Speech Logical/coherent  Interaction Minimal  Motor Activity Slow  Appearance/Hygiene In scrubs  Behavior Characteristics Cooperative  Mood Pleasant  Thought Process  Coherency WDL  Content WDL  Delusions None reported or observed  Perception WDL  Hallucination None reported or observed  Judgment Limited  Confusion None  Danger to Self  Current suicidal ideation? Denies  Danger to Others  Danger to Others None reported or observed

## 2023-07-11 NOTE — Plan of Care (Signed)
  Problem: Education: Goal: Utilization of techniques to improve thought processes will improve Outcome: Progressing Goal: Knowledge of the prescribed therapeutic regimen will improve Outcome: Progressing   Problem: Activity: Goal: Interest or engagement in leisure activities will improve Outcome: Progressing Goal: Imbalance in normal sleep/wake cycle will improve Outcome: Progressing   Problem: Coping: Goal: Coping ability will improve Outcome: Progressing Goal: Will verbalize feelings Outcome: Progressing   Problem: Health Behavior/Discharge Planning: Goal: Ability to make decisions will improve Outcome: Progressing

## 2023-07-11 NOTE — BHH Counselor (Signed)
 CSW has sent referral to [facility]  CSW received confirmation fax.  Sherryle Margo, MSW, LCSW 07/11/2023 1:30 PM

## 2023-07-11 NOTE — BH IP Treatment Plan (Signed)
 Interdisciplinary Treatment and Diagnostic Plan Update  07/11/2023 Time of Session: 10:13AM Franklin [name] MRN: [id number]  Principal Diagnosis: MDD (major depressive disorder), recurrent episode, severe (HCC)  Secondary Diagnoses: Principal Problem:   MDD (major depressive disorder), recurrent episode, severe (HCC)   Current Medications:  Current Facility-Administered Medications  Medication Dose Route Frequency Provider Last Rate Last Admin   acetaminophen  (TYLENOL ) tablet 650 mg  650 mg Oral Q6H PRN Brent, Amanda C, NP       alum & mag hydroxide-simeth (MAALOX/MYLANTA) 200-200-20 MG/5ML suspension 30 mL  30 mL Oral Q4H PRN Brent, Amanda C, NP       ARIPiprazole  (ABILIFY ) tablet 10 mg  10 mg Oral Daily Greenwood, Howard F, RPH   10 mg at 07/11/23 1000   haloperidol  (HALDOL ) tablet 5 mg  5 mg Oral TID PRN Brent, Amanda C, NP       And   diphenhydrAMINE  (BENADRYL ) capsule 50 mg  50 mg Oral TID PRN Brent, Amanda C, NP       haloperidol  lactate (HALDOL ) injection 5 mg  5 mg Intramuscular TID PRN Brent, Amanda C, NP       And   diphenhydrAMINE  (BENADRYL ) injection 50 mg  50 mg Intramuscular TID PRN Thresa Alan BROCKS, NP       And   LORazepam  (ATIVAN ) injection 2 mg  2 mg Intramuscular TID PRN Brent, Amanda C, NP       haloperidol  lactate (HALDOL ) injection 10 mg  10 mg Intramuscular TID PRN Thresa Alan BROCKS, NP       And   diphenhydrAMINE  (BENADRYL ) injection 50 mg  50 mg Intramuscular TID PRN Brent, Amanda C, NP       And   LORazepam  (ATIVAN ) injection 2 mg  2 mg Intramuscular TID PRN Brent, Amanda C, NP       FLUoxetine  (PROZAC ) capsule 20 mg  20 mg Oral Daily Brent, Amanda C, NP   20 mg at 07/11/23 1000   hydrOXYzine  (ATARAX ) tablet 25 mg  25 mg Oral TID PRN Brent, Amanda C, NP   25 mg at 07/10/23 2203   magnesium  hydroxide (MILK OF MAGNESIA) suspension 30 mL  30 mL Oral Daily PRN Brent, Amanda C, NP       traZODone  (DESYREL ) tablet 50 mg  50 mg Oral QHS PRN Brent, Amanda C, NP   50 mg at  07/10/23 2203   PTA Medications: Medications Prior to Admission  Medication Sig Dispense Refill Last Dose/Taking   albuterol  (VENTOLIN  HFA) 108 (90 Base) MCG/ACT inhaler Inhale 2 puffs into the lungs every 6 (six) hours as needed for wheezing or shortness of breath. 8 g 0    ARIPiprazole  (ABILIFY ) 10 MG tablet Take 1 tablet (10 mg total) by mouth daily. (Patient not taking: Reported on 07/09/2023) 30 tablet 0    doxycycline  (VIBRAMYCIN ) 100 MG capsule Take 1 capsule (100 mg total) by mouth 2 (two) times daily for 7 days. (Patient not taking: Reported on 07/09/2023) 14 capsule 0    FLUoxetine  (PROZAC ) 20 MG capsule Take 1 capsule (20 mg total) by mouth daily. (Patient not taking: Reported on 07/09/2023) 30 capsule 0    fluticasone  furoate-vilanterol (BREO ELLIPTA ) 100-25 MCG/ACT AEPB Inhale 1 puff into the lungs daily. 30 each 0    gabapentin  (NEURONTIN ) 300 MG capsule Take 1 capsule (300 mg total) by mouth 3 (three) times daily. 90 capsule 0    ipratropium-albuterol  (DUONEB) 0.5-2.5 (3) MG/3ML SOLN Take 3 mLs by nebulization every 6 (  six) hours as needed. (Patient taking differently: Take 3 mLs by nebulization every 6 (six) hours as needed (For shortness of breath).) 360 mL 0    mometasone -formoterol  (DULERA ) 200-5 MCG/ACT AERO Inhale 2 puffs into the lungs 2 (two) times daily in the am and at bedtime.. (Patient not taking: Reported on 07/09/2023)      Multiple Vitamin (MULTIVITAMIN WITH MINERALS) TABS tablet Take 1 tablet by mouth daily.      naltrexone  (DEPADE) 50 MG tablet Take 1 tablet (50 mg total) by mouth at bedtime. 30 tablet 0    propranolol  (INDERAL ) 10 MG tablet Take 1 tablet (10 mg total) by mouth 2 (two) times daily. 60 tablet 0    thiamine  (VITAMIN B-1) 100 MG tablet Take 1 tablet (100 mg total) by mouth daily.      traZODone  (DESYREL ) 50 MG tablet Take 1 tablet (50 mg total) by mouth at bedtime as needed for sleep. 30 tablet 0    vitamin D3 (CHOLECALCIFEROL ) 25 MCG tablet Take 1 tablet  (1,000 Units total) by mouth daily with breakfast. 30 tablet 0     Patient Stressors: Substance abuse    Patient Strengths: Capable of independent living   Treatment Modalities: Medication Management, Group therapy, Case management,  1 to 1 session with clinician, Psychoeducation, Recreational therapy.   Physician Treatment Plan for Primary Diagnosis: MDD (major depressive disorder), recurrent episode, severe (HCC) Long Term Goal(s): Improvement in symptoms so as ready for discharge   Short Term Goals: Ability to identify changes in lifestyle to reduce recurrence of condition will improve Ability to verbalize feelings will improve Ability to disclose and discuss suicidal ideas Ability to demonstrate self-control will improve Ability to identify and develop effective coping behaviors will improve Ability to maintain clinical measurements within normal limits will improve Ability to identify triggers associated with substance abuse/mental health issues will improve  Medication Management: Evaluate patient's response, side effects, and tolerance of medication regimen.  Therapeutic Interventions: 1 to 1 sessions, Unit Group sessions and Medication administration.  Evaluation of Outcomes: Not Met  Physician Treatment Plan for Secondary Diagnosis: Principal Problem:   MDD (major depressive disorder), recurrent episode, severe (HCC)  Long Term Goal(s): Improvement in symptoms so as ready for discharge   Short Term Goals: Ability to identify changes in lifestyle to reduce recurrence of condition will improve Ability to verbalize feelings will improve Ability to disclose and discuss suicidal ideas Ability to demonstrate self-control will improve Ability to identify and develop effective coping behaviors will improve Ability to maintain clinical measurements within normal limits will improve Ability to identify triggers associated with substance abuse/mental health issues will improve      Medication Management: Evaluate patient's response, side effects, and tolerance of medication regimen.  Therapeutic Interventions: 1 to 1 sessions, Unit Group sessions and Medication administration.  Evaluation of Outcomes: Not Met   RN Treatment Plan for Primary Diagnosis: MDD (major depressive disorder), recurrent episode, severe (HCC) Long Term Goal(s): Knowledge of disease and therapeutic regimen to maintain health will improve  Short Term Goals: Ability to demonstrate self-control, Ability to participate in decision making will improve, Ability to verbalize feelings will improve, Ability to disclose and discuss suicidal ideas, Ability to identify and develop effective coping behaviors will improve, and Compliance with prescribed medications will improve  Medication Management: RN will administer medications as ordered by provider, will assess and evaluate patient's response and provide education to patient for prescribed medication. RN will report any adverse and/or side effects to prescribing  provider.  Therapeutic Interventions: 1 on 1 counseling sessions, Psychoeducation, Medication administration, Evaluate responses to treatment, Monitor vital signs and CBGs as ordered, Perform/monitor CIWA, COWS, AIMS and Fall Risk screenings as ordered, Perform wound care treatments as ordered.  Evaluation of Outcomes: Not Met   LCSW Treatment Plan for Primary Diagnosis: MDD (major depressive disorder), recurrent episode, severe (HCC) Long Term Goal(s): Safe transition to appropriate next level of care at discharge, Engage patient in therapeutic group addressing interpersonal concerns.  Short Term Goals: Engage patient in aftercare planning with referrals and resources, Increase social support, Increase ability to appropriately verbalize feelings, Increase emotional regulation, Facilitate acceptance of mental health diagnosis and concerns, Facilitate patient progression through stages of change  regarding substance use diagnoses and concerns, Identify triggers associated with mental health/substance abuse issues, and Increase skills for wellness and recovery  Therapeutic Interventions: Assess for all discharge needs, 1 to 1 time with Social worker, Explore available resources and support systems, Assess for adequacy in community support network, Educate family and significant other(s) on suicide prevention, Complete Psychosocial Assessment, Interpersonal group therapy.  Evaluation of Outcomes: Not Met   Progress in Treatment: Attending groups: No. Participating in groups: No. Taking medication as prescribed: Yes. Toleration medication: Yes. Family/Significant other contact made: No, will contact:  once permission has been granted.  Patient understands diagnosis: Yes. Discussing patient identified problems/goals with staff: Yes. Medical problems stabilized or resolved: Yes. Denies suicidal/homicidal ideation: Yes. Issues/concerns per patient self-inventory: No. Other: none  New problem(s) identified: No, Describe:  none  New Short Term/Long Term Goal(s): detox, elimination of symptoms of psychosis, medication management for mood stabilization; elimination of SI thoughts; development of comprehensive mental wellness/sobriety plan.   Patient Goals:  get back on my meds  Discharge Plan or Barriers: CSW to assist patient in development of appropriate discharge plans.  Patient reports that he would like to be discharged to a SUD facility.   Reason for Continuation of Hospitalization: Anxiety Depression Medication stabilization Suicidal ideation  Estimated Length of Stay:  1-7 days  Last 3 [place] Suicide Severity Risk Score: Flowsheet Row Admission (Current) from 07/09/2023 in [facility] Most recent reading at 07/09/2023  2:51 PM ED from 07/09/2023 in [facility] Emergency Department at [facility] Most recent reading at 07/09/2023  7:57 AM ED from  07/09/2023 in [facility] Emergency Department at [facility] Most recent reading at 07/09/2023 12:52 AM  C-SSRS RISK CATEGORY High Risk High Risk No Risk    Last PHQ 2/9 Scores:    06/14/2023   10:09 AM 06/13/2023    1:31 PM 06/08/2023    4:37 PM  Depression screen PHQ 2/9  Decreased Interest 0 2 2  Down, Depressed, Hopeless 0 2 2  PHQ - 2 Score 0 4 4  Altered sleeping 0 2 2  Tired, decreased energy 0 2 2  Change in appetite 0 0 0  Feeling bad or failure about yourself  0 1 1  Trouble concentrating 0 0 0  Moving slowly or fidgety/restless 0 0 0  Suicidal thoughts 0 0 0  PHQ-9 Score 0 9 9  Difficult doing work/chores   Somewhat difficult    Scribe for Treatment Team: Sherryle JINNY Margo, LCSW 07/11/2023 11:42 AM

## 2023-07-11 NOTE — Progress Notes (Signed)
 [facility] MD Progress Note  07/11/2023 3:36 PM Franklin [name]  MRN:  [id number]  Patient is a 53 year old man with history of MDD, PTSD; alcohol  use disorder,stimulant use disorder whom presented to ED with c/o active suicidal ideation with plan to jump off a bridge. Patient is admitted to [facility] unit with Q15 min safety monitoring. Multidisciplinary team approach is offered. Medication management; group/milieu therapy is offered.   Subjective:  Chart reviewed, case discussed in multidisciplinary meeting, patient seen during rounds.  Patient met with the treatment team today.  He reports that once he feels better and emotionally stable he wants to go to [place] for substance use rehab.  Social worker was present and informed him that our Phone number will be provided to him and he needs to call them and schedule the intake.  He continues to endorse feeling depressed and hopelessness.  He reports passive suicidal thoughts but denies having any active plan or intent.  He denies auditory/visual hallucinations.  He is taking his medications with no reported side effect.  He asked for albuterol  inhaler.   Sleep: Fair  Appetite:  Fair  Past Psychiatric History: see h&P Family History:  Family History  Problem Relation Age of Onset   Hypertension Other    Diabetes Other    Social History:  Social History   Substance and Sexual Activity  Alcohol  Use Yes   Alcohol /week: 12.0 standard drinks of alcohol    Types: 12 Cans of beer per week     Social History   Substance and Sexual Activity  Drug Use Yes   Types: Crack cocaine, Cocaine   Comment: one gram daily    Social History   Socioeconomic History   Marital status: Single    Spouse name: Not on file   Number of children: Not on file   Years of education: Not on file   Highest education level: Not on file  Occupational History   Occupation: [employer]  Tobacco Use   Smoking status: Some Days    Current packs/day: 0.50    Types: Cigarettes    Smokeless tobacco: Never  Vaping Use   Vaping status: Never Used  Substance and Sexual Activity   Alcohol  use: Yes    Alcohol /week: 12.0 standard drinks of alcohol     Types: 12 Cans of beer per week   Drug use: Yes    Types: Crack cocaine, Cocaine    Comment: one gram daily   Sexual activity: Yes  Other Topics Concern   Not on file  Social History Narrative   The patient was born and raised in with that by both his biological parents. His father's passed away but his mother still living. He denies any history of any physical or sexual abuse. He says he completed 2 years of college at [employer]. 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 He has never been married but has a 39 year old son who lives with his mother. He says he does get to see his son and has a relationship with him. He is not currently dating or in a relationship.      The patient does have a history of a DUI and had a court date last Friday for a DUI. He denies any other pending charges.            Social Drivers of Corporate investment banker Strain: Not on file  Food Insecurity: Food  Insecurity Present (07/09/2023)   Hunger Vital Sign    Worried About Running Out of Food in the Last Year: Often true    Ran Out of Food in the Last Year: Often true  Transportation Needs: Unmet Transportation Needs (07/09/2023)   PRAPARE - Administrator, Civil Service (Medical): Yes    Lack of Transportation (Non-Medical): Yes  Physical Activity: Not on file  Stress: Not on file  Social Connections: Socially Isolated (07/09/2023)   Social Connection and Isolation Panel    Frequency of Communication with Friends and Family: Never    Frequency of Social Gatherings with Friends and Family: Never    Attends Religious Services: Never    Database administrator or Organizations: Yes    Attends Engineer, structural: Never    Marital Status:  Never married   Past Medical History:  Past Medical History:  Diagnosis Date   A-fib (HCC)    Anxiety    COPD (chronic obstructive pulmonary disease) (HCC)    Depression    PTSD (post-traumatic stress disorder) 06/11/2023   Substance abuse (HCC)    Vitamin D  insufficiency 06/12/2023    Past Surgical History:  Procedure Laterality Date   NO PAST SURGERIES      Current Medications: Current Facility-Administered Medications  Medication Dose Route Frequency Provider Last Rate Last Admin   acetaminophen  (TYLENOL ) tablet 650 mg  650 mg Oral Q6H PRN Brent, Amanda C, NP       alum & mag hydroxide-simeth (MAALOX/MYLANTA) 200-200-20 MG/5ML suspension 30 mL  30 mL Oral Q4H PRN Brent, Amanda C, NP       ARIPiprazole  (ABILIFY ) tablet 10 mg  10 mg Oral Daily Niels Barrio F, RPH   10 mg at 07/11/23 1000   haloperidol  (HALDOL ) tablet 5 mg  5 mg Oral TID PRN Brent, Amanda C, NP       And   diphenhydrAMINE  (BENADRYL ) capsule 50 mg  50 mg Oral TID PRN Brent, Amanda C, NP       haloperidol  lactate (HALDOL ) injection 5 mg  5 mg Intramuscular TID PRN Brent, Amanda C, NP       And   diphenhydrAMINE  (BENADRYL ) injection 50 mg  50 mg Intramuscular TID PRN Brent, Amanda C, NP       And   LORazepam  (ATIVAN ) injection 2 mg  2 mg Intramuscular TID PRN Brent, Amanda C, NP       haloperidol  lactate (HALDOL ) injection 10 mg  10 mg Intramuscular TID PRN Thresa Alan BROCKS, NP       And   diphenhydrAMINE  (BENADRYL ) injection 50 mg  50 mg Intramuscular TID PRN Brent, Amanda C, NP       And   LORazepam  (ATIVAN ) injection 2 mg  2 mg Intramuscular TID PRN Brent, Amanda C, NP       FLUoxetine  (PROZAC ) capsule 20 mg  20 mg Oral Daily Brent, Amanda C, NP   20 mg at 07/11/23 1000   hydrOXYzine  (ATARAX ) tablet 25 mg  25 mg Oral TID PRN Brent, Amanda C, NP   25 mg at 07/10/23 2203   magnesium  hydroxide (MILK OF MAGNESIA) suspension 30 mL  30 mL Oral Daily PRN Brent, Amanda C, NP       traZODone  (DESYREL ) tablet 50 mg   50 mg Oral QHS PRN Brent, Amanda C, NP   50 mg at 07/10/23 2203    Lab Results: No results found for this or any previous visit (from the  past 48 hours).  Blood Alcohol  level:  Lab Results  Component Value Date   [facility] <15 06/07/2023   ETH <10 02/27/2023    Metabolic Disorder Labs: Lab Results  Component Value Date   HGBA1C 6.3 (H) 06/11/2023   MPG 134 06/11/2023   MPG 114.02 03/05/2023   No results found for: PROLACTIN Lab Results  Component Value Date   CHOL 184 06/11/2023   TRIG 98 06/11/2023   HDL 50 06/11/2023   CHOLHDL 3.7 06/11/2023   VLDL 20 06/11/2023   LDLCALC 114 (H) 06/11/2023   LDLCALC 105 (H) 03/05/2023    Physical Findings: AIMS:  , ,  ,  ,    CIWA:    COWS:      Psychiatric Specialty Exam:  Presentation  General Appearance:  Appropriate for Environment; Casual  Eye Contact: Minimal  Speech: Normal Rate  Speech Volume: Normal    Mood and Affect  Mood: Depressed; Dysphoric  Affect: Depressed; Flat   Thought Process  Thought Processes: Coherent  Descriptions of Associations:Intact  Orientation:Full (Time, Place and Person)  Thought Content:Illogical  Hallucinations:Hallucinations: None  Ideas of Reference:None  Suicidal Thoughts:Suicidal Thoughts: Yes, Passive SI Passive Intent and/or Plan: Without Intent; Without Plan  Homicidal Thoughts:Homicidal Thoughts: No   Sensorium  Memory: Recent Fair; Immediate Fair; Remote Fair  Judgment: Impaired  Insight: Shallow   Executive Functions  Concentration: Fair  Attention Span: Fair  Recall: [employer] of Knowledge: Fair  Language: Fair   Psychomotor Activity  Psychomotor Activity: Psychomotor Activity: Normal  Musculoskeletal: Strength & Muscle Tone: within normal limits Gait & Station: normal Assets  Assets: Manufacturing systems engineer; Desire for Improvement; Resilience    Physical Exam: Physical Exam Vitals and nursing note reviewed.     ROS Blood pressure (!) 123/93, pulse 65, temperature 98.1 F (36.7 C), resp. rate 18, height 5' 10 (1.778 m), weight 76 kg, SpO2 97%. Body mass index is 24.03 kg/m.  Diagnosis: Principal Problem:   MDD (major depressive disorder), recurrent episode, severe (HCC) Stimulant use disorder   Clinical Decision Making: Patient with history of depression, alcohol  use, stimulant use disorder is currently admitted for worsening depression and suicidal ideation in the context of multiple psychosocial stressors including homelessness and chronic substance use.   Treatment Plan Summary:   Safety and Monitoring:             -- Voluntary admission to inpatient psychiatric unit for safety, stabilization and treatment             -- Daily contact with patient to assess and evaluate symptoms and progress in treatment             -- Patient's case to be discussed in multi-disciplinary team meeting             -- Observation Level: q15 minute checks             -- Vital signs:  q12 hours             -- Precautions: suicide, elopement, and assault   2. Psychiatric Diagnoses and Treatment:              Continue Prozac  20 mg daily and Abilify  10 mg daily     -- The risks/benefits/side-effects/alternatives to this medication were discussed in detail with the patient and time was given for questions. The patient consents to medication trial.                -- Metabolic profile  and EKG monitoring obtained while on an atypical antipsychotic (BMI: Lipid Panel: HbgA1c: QTc:)              -- Encouraged patient to participate in unit milieu and in scheduled group therapies                            3. Medical Issues Being Addressed:    No acute medical concerns noted at this time.  4. Discharge Planning:   -- Social work and case management to assist with discharge planning and identification of hospital follow-up needs prior to discharge  -- Estimated LOS: 3-4 days  Tamina Cyphers, MD 07/11/2023,  3:36 PM

## 2023-07-11 NOTE — Progress Notes (Signed)
 Patient is a voluntary admission to [facility] for MDD and polysubstance abuse.  Patient mostly isolates to his room.  Was given list of phone numbers by SW to find rehab but did not try to make any calls even after this nurse went in and prompted his to do so.  Patient will be on a lock out order which he is aware of with this nurse reminding him he is here for help but does not attend group.  He states understanding of this lock out order.  Will continue to monitor.

## 2023-07-11 NOTE — Group Note (Signed)
 Date:  07/11/2023 Time:  10:26 AM  Group Topic/Focus:  Coping With Mental Health Crisis:   The purpose of this group is to help patients identify strategies for coping with mental health crisis.  Group discusses possible causes of crisis and ways to manage them effectively.    Participation Level:  Active  Participation Quality:  Appropriate  Affect:  Appropriate  Cognitive:  Appropriate  Insight: Appropriate  Engagement in Group:  Engaged  Modes of Intervention:  Activity  Additional Comments:    Franklin [name] 07/11/2023, 10:26 AM

## 2023-07-11 NOTE — Group Note (Signed)
 Recreation Therapy Group Note   Group Topic:Emotion Expression  Group Date: 07/11/2023 Start Time: 1500 End Time: 1600 Facilitators: Celestia Jeoffrey BRAVO, LRT, CTRS Location: Dayroom  Group Description: Painting a Diplomatic Services operational officer. Patients and LRT discuss what it means to be "at peace", what it feels like physically and mentally. Pts are given a canvas and watercolor paint to use and encouraged to draw their idea of a peaceful place. Pts and LRT discuss how they use this in their daily life post discharge. Pts are encouraged to take their canvas home with them as a reminder to find their peaceful place whenever they are feeling depressed, anxious, etc.    Goal Area(s) Addressed:  Patient will identify what it means to experience a "peaceful" emotion. Patient will identify a new coping skill.  Patient will express their emotions through art. Patients will increase communication by talking with LRT and peers while in group   Affect/Mood: N/A   Participation Level: Did not attend    Clinical Observations/Individualized Feedback: Patient did not attend group.   Plan: Continue to engage patient in RT group sessions 2-3x/week.   [address], LRT, CTRS 07/11/2023 5:20 PM

## 2023-07-11 NOTE — [facility] Suicide Risk Assessment (Signed)
 [facility] INPATIENT:  Family/Significant Other Suicide Prevention Education  Suicide Prevention Education:  Patient Refusal for Family/Significant Other Suicide Prevention Education: The patient Franklin [name] has refused to provide written consent for family/significant other to be provided Family/Significant Other Suicide Prevention Education during admission and/or prior to discharge.  Physician notified.  SPE completed with pt, as pt refused to consent to family contact. SPI pamphlet provided to pt and pt was encouraged to share information with support network, ask questions, and talk about any concerns relating to SPE. Pt denies access to guns/firearms and verbalized understanding of information provided. Mobile Crisis information also provided to pt.    Sherryle JINNY Margo 07/11/2023, 11:45 AM

## 2023-07-11 NOTE — [facility] Counselor (Signed)
 Adult Comprehensive Assessment  Patient ID: Franklin [name], male   DOB: 1970/05/11, 53 y.o.   MRN: [id number]  Information Source: Information source: Patient  Current Stressors:  Patient states their primary concerns and needs for treatment are:: depression, anxiety, suicidal, crack, alcohol  Patient states their goals for this hospitilization and ongoing recovery are:: got some help Educational / Learning stressors: Pt denies. Employment / Job issues: Pt denies. Family Relationships: Pt denies. Financial / Lack of resources (include bankruptcy): I don't have enough money Housing / Lack of housing: Pt denies. Physical health (include injuries & life threatening diseases): COPD Social relationships: I have nothing Substance abuse: crack, alcohol  Bereavement / Loss: Pt denies.  Living/Environment/Situation:  Living Arrangements: Other (Comment) Living conditions (as described by patient or guardian): Pt reports that he is homeless and staying on the street. Who else lives in the home?: Pt reports that he is homeless. How long has patient lived in current situation?: 6 months What is atmosphere in current home: Other (Comment) (rough)  Family History:  Marital status: Single Are you sexually active?: No What is your sexual orientation?: straight Has your sexual activity been affected by drugs, alcohol , medication, or emotional stress?: All of the above Does patient have children?: No  Childhood History:  By whom was/is the patient raised?: Both parents Description of patient's relationship with caregiver when they were a child: okay Patient's description of current relationship with people who raised him/her: Pt reports that parents are deceased. How were you disciplined when you got in trouble as a child/adolescent?: whoopings Does patient have siblings?: No Did patient suffer any verbal/emotional/physical/sexual abuse as a child?: No Did patient suffer  from severe childhood neglect?: No Has patient ever been sexually abused/assaulted/raped as an adolescent or adult?: No Was the patient ever a victim of a crime or a disaster?: No Witnessed domestic violence?: No Has patient been affected by domestic violence as an adult?: No  Education:  Highest grade of school patient has completed: 2 years of college Currently a Consulting civil engineer?: No Learning disability?: No  Employment/Work Situation:   Employment Situation: Unemployed What is the Longest Time Patient has Held a Job?: 5 years 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?: No  Financial Resources:   Financial resources: Medicaid Does patient have a Lawyer or guardian?: No  Alcohol /Substance Abuse:   What has been your use of drugs/alcohol  within the last 12 months?: Crack:  daily, gram Alcohol : daily 12-pack If attempted suicide, did drugs/alcohol  play a role in this?: Yes (last week I was on the bridge and was thinking of jumping off) Alcohol /Substance Abuse Treatment Hx: Past Tx, Inpatient If yes, describe treatment: good  Social Support System:   Forensic psychologist System: None Describe Community Support System: Pt denies. Type of faith/religion: Pt denies. How does patient's faith help to cope with current illness?: Pt denies.  Leisure/Recreation:   Do You Have Hobbies?: No  Strengths/Needs:   What is the patient's perception of their strengths?: Pt denies. Patient states they can use these personal strengths during their treatment to contribute to their recovery: Pt denies. Patient states these barriers may affect/interfere with their treatment: Pt denies. Patient states these barriers may affect their return to the community: Pt denies.  Discharge Plan:   Currently receiving community mental health services: No Patient states concerns and preferences for aftercare planning are: Pt  reports that he is seeking a referral for substance use treatment. Patient  states they will know when they are safe and ready for discharge when: I don't know Does patient have access to transportation?: Yes Does patient have financial barriers related to discharge medications?: No Will patient be returning to same living situation after discharge?: Yes  Summary/Recommendations:   Summary and Recommendations (to be completed by the evaluator): Patient is a 53 year old male from [place], [place] [facility]).  Patient presents to the hospital for concerns of suicidal ideations.  Patient reports that he was on a bridge and contemplating jumping off.  He reports that a trigger to his current mental health status has been his noncompliance with his medications.  He also reports that he has been struggling with housing instability for the last six months.  He reports that he has been unable to stay with family or friend support and has been staying on the street.  He also reports that current mental health symptoms have been triggered by his substance use.  He reports that he is seeking residential treatment to address his use.  Recommendations include: crisis stabilization, therapeutic milieu, encourage group attendance and participation, medication management for mood stabilization and development of comprehensive mental wellness/sobriety plan.  Sherryle JINNY Margo. 07/11/2023

## 2023-07-12 NOTE — Progress Notes (Signed)
 Patient is a voluntary admission to [facility] for depression and polysubstance abuse seeking rehab.  Patient had a lock out order today and attended group in the morning and called [facility] for rehab.  Stated he needed to call them back around 4pm but stated they didn't answer when he called back.  Denies SI, HI, AVH, anxiety and depression.  Will continue to monitor.

## 2023-07-12 NOTE — Group Note (Signed)
 [facility] LCSW Group Therapy Note   Group Date: 07/12/2023 Start Time: 1300 End Time: 1400   Type of Therapy and Topic: Group Therapy: Avoiding Self-Sabotaging and Enabling Behaviors  Participation Level: Minimal  Mood:  Description of Group:  In this group, patients will learn how to identify obstacles, self-sabotaging and enabling behaviors, as well as: what are they, why do we do them and what needs these behaviors meet. Discuss unhealthy relationships and how to have positive healthy boundaries with those that sabotage and enable. Explore aspects of self-sabotage and enabling in yourself and how to limit these self-destructive behaviors in everyday life.   Therapeutic Goals: 1. Patient will identify one obstacle that relates to self-sabotage and enabling behaviors 2. Patient will identify one personal self-sabotaging or enabling behavior they did prior to admission 3. Patient will state a plan to change the above identified behavior 4. Patient will demonstrate ability to communicate their needs through discussion and/or role play.    Summary of Patient Progress:   Pt appropriate throughout group and minimally engaged with peers in meaningful discussion about group topic   Therapeutic Modalities:  Cognitive Behavioral Therapy Person-Centered Therapy Motivational Interviewing    Lum Franklin [name], LCSWA

## 2023-07-12 NOTE — Plan of Care (Signed)
  Problem: Education: Goal: Knowledge of the prescribed therapeutic regimen will improve Outcome: Progressing   Problem: Activity: Goal: Interest or engagement in leisure activities will improve Outcome: Not Progressing

## 2023-07-12 NOTE — Progress Notes (Signed)
 [facility] MD Progress Note  07/12/2023 4:39 PM Franklin [name]  MRN:  [id number]  Patient is a 53 year old man with history of MDD, PTSD; alcohol  use disorder,stimulant use disorder whom presented to ED with c/o active suicidal ideation with plan to jump off a bridge. Patient is admitted to [facility] unit with Q15 min safety monitoring. Multidisciplinary team approach is offered. Medication management; group/milieu therapy is offered.   Subjective:  Chart reviewed, case discussed in multidisciplinary meeting, patient seen during rounds.  Patient is noted to be staying in the day area as his room was locked.  Per nursing patient consistently refuses to participate in groups and come out of his room and sleeps all day.  With this provider he denies auditory/visual hallucinations and denies suicidal/homicidal ideation.  He informed the provider that he called our call 1 time and he was told to call back again in the afternoon.  Per nursing patient is not making an effort to call or cough consistently to get the initial assessment done before they make any decision about accepting.  Patient is taking his medications and denies having any side effects.  Sleep: Fair  Appetite:  Fair  Past Psychiatric History: see h&P Family History:  Family History  Problem Relation Age of Onset   Hypertension Other    Diabetes Other    Social History:  Social History   Substance and Sexual Activity  Alcohol  Use Yes   Alcohol /week: 12.0 standard drinks of alcohol    Types: 12 Cans of beer per week     Social History   Substance and Sexual Activity  Drug Use Yes   Types: Crack cocaine, Cocaine   Comment: one gram daily    Social History   Socioeconomic History   Marital status: Single    Spouse name: Not on file   Number of children: Not on file   Years of education: Not on file   Highest education level: Not on file  Occupational History   Occupation: [employer]  Tobacco Use   Smoking status: Some Days     Current packs/day: 0.50    Types: Cigarettes   Smokeless tobacco: Never  Vaping Use   Vaping status: Never Used  Substance and Sexual Activity   Alcohol  use: Yes    Alcohol /week: 12.0 standard drinks of alcohol     Types: 12 Cans of beer per week   Drug use: Yes    Types: Crack cocaine, Cocaine    Comment: one gram daily   Sexual activity: Yes  Other Topics Concern   Not on file  Social History Narrative   The patient was born and raised in with that by both his biological parents. His father's passed away but his mother still living. He denies any history of any physical or sexual abuse. He says he completed 2 years of college at [employer]. 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 He has never been married but has a 3 year old son who lives with his mother. He says he does get to see his son and has a relationship with him. He is not currently dating or in a relationship.      The patient does have a history of a DUI and had a court date last Friday for a DUI. He denies any other pending charges.            Social Drivers of Corporate investment banker  Strain: Not on file  Food Insecurity: Food Insecurity Present (07/09/2023)   Hunger Vital Sign    Worried About Running Out of Food in the Last Year: Often true    Ran Out of Food in the Last Year: Often true  Transportation Needs: Unmet Transportation Needs (07/09/2023)   PRAPARE - Administrator, Civil Service (Medical): Yes    Lack of Transportation (Non-Medical): Yes  Physical Activity: Not on file  Stress: Not on file  Social Connections: Socially Isolated (07/09/2023)   Social Connection and Isolation Panel    Frequency of Communication with Friends and Family: Never    Frequency of Social Gatherings with Friends and Family: Never    Attends Religious Services: Never    Database administrator or Organizations: Yes    Attends Museum/gallery exhibitions officer: Never    Marital Status: Never married   Past Medical History:  Past Medical History:  Diagnosis Date   A-fib (HCC)    Anxiety    COPD (chronic obstructive pulmonary disease) (HCC)    Depression    PTSD (post-traumatic stress disorder) 06/11/2023   Substance abuse (HCC)    Vitamin D  insufficiency 06/12/2023    Past Surgical History:  Procedure Laterality Date   NO PAST SURGERIES      Current Medications: Current Facility-Administered Medications  Medication Dose Route Frequency Provider Last Rate Last Admin   acetaminophen  (TYLENOL ) tablet 650 mg  650 mg Oral Q6H PRN Brent, Amanda C, NP       albuterol  (VENTOLIN  HFA) 108 (90 Base) MCG/ACT inhaler 1 puff  1 puff Inhalation Q6H PRN Keshawn Fiorito, MD       alum & mag hydroxide-simeth (MAALOX/MYLANTA) 200-200-20 MG/5ML suspension 30 mL  30 mL Oral Q4H PRN Brent, Amanda C, NP       ARIPiprazole  (ABILIFY ) tablet 10 mg  10 mg Oral Daily Greenwood, Howard F, RPH   10 mg at 07/12/23 1000   haloperidol  (HALDOL ) tablet 5 mg  5 mg Oral TID PRN Brent, Amanda C, NP       And   diphenhydrAMINE  (BENADRYL ) capsule 50 mg  50 mg Oral TID PRN Brent, Amanda C, NP       haloperidol  lactate (HALDOL ) injection 5 mg  5 mg Intramuscular TID PRN Brent, Amanda C, NP       And   diphenhydrAMINE  (BENADRYL ) injection 50 mg  50 mg Intramuscular TID PRN Brent, Amanda C, NP       And   LORazepam  (ATIVAN ) injection 2 mg  2 mg Intramuscular TID PRN Brent, Amanda C, NP       haloperidol  lactate (HALDOL ) injection 10 mg  10 mg Intramuscular TID PRN Brent, Amanda C, NP       And   diphenhydrAMINE  (BENADRYL ) injection 50 mg  50 mg Intramuscular TID PRN Brent, Amanda C, NP       And   LORazepam  (ATIVAN ) injection 2 mg  2 mg Intramuscular TID PRN Brent, Amanda C, NP       FLUoxetine  (PROZAC ) capsule 20 mg  20 mg Oral Daily Brent, Amanda C, NP   20 mg at 07/12/23 1001   hydrOXYzine  (ATARAX ) tablet 25 mg  25 mg Oral TID PRN Brent, Amanda C, NP    25 mg at 07/10/23 2203   magnesium  hydroxide (MILK OF MAGNESIA) suspension 30 mL  30 mL Oral Daily PRN Brent, Amanda C, NP       traZODone  (DESYREL ) tablet 50 mg  50 mg Oral QHS PRN Brent, Amanda C, NP   50 mg at 07/10/23 2203    Lab Results: No results found for this or any previous visit (from the past 48 hours).  Blood Alcohol  level:  Lab Results  Component Value Date   [facility] <15 06/07/2023   ETH <10 02/27/2023    Metabolic Disorder Labs: Lab Results  Component Value Date   HGBA1C 6.3 (H) 06/11/2023   MPG 134 06/11/2023   MPG 114.02 03/05/2023   No results found for: PROLACTIN Lab Results  Component Value Date   CHOL 184 06/11/2023   TRIG 98 06/11/2023   HDL 50 06/11/2023   CHOLHDL 3.7 06/11/2023   VLDL 20 06/11/2023   LDLCALC 114 (H) 06/11/2023   LDLCALC 105 (H) 03/05/2023    Physical Findings: AIMS:  , ,  ,  ,    CIWA:    COWS:      Psychiatric Specialty Exam:  Presentation  General Appearance:  Appropriate for Environment; Casual  Eye Contact: Minimal  Speech: Normal Rate  Speech Volume: Normal    Mood and Affect  Mood: Depressed; Dysphoric  Affect: Depressed; Flat   Thought Process  Thought Processes: Coherent  Descriptions of Associations:Intact  Orientation:Full (Time, Place and Person)  Thought Content:Illogical  Hallucinations: Denies  Ideas of Reference:None  Suicidal Thoughts: Denies  Homicidal Thoughts: Denies   Sensorium  Memory: Recent Fair; Immediate Fair; Remote Fair  Judgment: Impaired  Insight: Shallow   Executive Functions  Concentration: Fair  Attention Span: Fair  Recall: [employer] of Knowledge: Fair  Language: Fair   Psychomotor Activity  Psychomotor Activity: No data recorded  Musculoskeletal: Strength & Muscle Tone: within normal limits Gait & Station: normal Assets  Assets: Manufacturing systems engineer; Desire for Improvement; Resilience    Physical Exam: Physical  Exam Vitals and nursing note reviewed.    ROS Blood pressure 139/89, pulse 60, temperature 98.2 F (36.8 C), temperature source Temporal, resp. rate 17, height 5' 10 (1.778 m), weight 76 kg, SpO2 98%. Body mass index is 24.03 kg/m.  Diagnosis: Principal Problem:   MDD (major depressive disorder), recurrent episode, severe (HCC) Stimulant use disorder   Clinical Decision Making: Patient with history of depression, alcohol  use, stimulant use disorder is currently admitted for worsening depression and suicidal ideation in the context of multiple psychosocial stressors including homelessness and chronic substance use.   Treatment Plan Summary:   Safety and Monitoring:             -- Voluntary admission to inpatient psychiatric unit for safety, stabilization and treatment             -- Daily contact with patient to assess and evaluate symptoms and progress in treatment             -- Patient's case to be discussed in multi-disciplinary team meeting             -- Observation Level: q15 minute checks             -- Vital signs:  q12 hours             -- Precautions: suicide, elopement, and assault   2. Psychiatric Diagnoses and Treatment:              Continue Prozac  20 mg daily and Abilify  10 mg daily     -- The risks/benefits/side-effects/alternatives to this medication were discussed in detail with the patient and time was given for questions. The patient  consents to medication trial.                -- Metabolic profile and EKG monitoring obtained while on an atypical antipsychotic (BMI: Lipid Panel: HbgA1c: QTc:)              -- Encouraged patient to participate in unit milieu and in scheduled group therapies                            3. Medical Issues Being Addressed:    No acute medical concerns noted at this time.  4. Discharge Planning:   -- Social work and case management to assist with discharge planning and identification of hospital follow-up needs prior to  discharge  -- Estimated LOS: 3-4 days  Vickye Astorino, MD 07/12/2023, 4:39 PM

## 2023-07-12 NOTE — Group Note (Signed)
 Date:  07/12/2023 Time:  11:04 AM  Group Topic/Focus:  Outside Rec/Music Therapy  The purpose of this group is too allow patients to go out and get fresh air while doing some outside activities. Also listening to soothing music     Participation Level:  Minimal Engagement in Group:  None  Modes of Intervention:  Activity  Additional Comments:    Franklin [name] 07/12/2023, 11:04 AM

## 2023-07-12 NOTE — Group Note (Signed)
 Recreation Therapy Group Note   Group Topic:Coping Skills  Group Date: 07/12/2023 Start Time: 1510 End Time: 1600 Facilitators: Celestia Jeoffrey BRAVO, LRT, CTRS Location: Dayroom  Group Description: Mind Map.  Patient was provided a blank template of a diagram with 32 blank boxes in a tiered system, branching from the center (similar to a bubble chart). LRT directed patients to label the middle of the diagram Coping Skills. LRT and patients then came up with 8 different coping skills as examples. Pt were directed to record their coping skills in the 2nd tier boxes closest to the center.  Patients would then share their coping skills with the group as LRT wrote them out. LRT gave a handout of 99 different coping skills at the end of group.   Goal Area(s) Addressed: Patients will be able to define "coping skills". Patient will identify new coping skills.  Patient will increase communication.   Affect/Mood: N/A   Participation Level: Did not attend    Clinical Observations/Individualized Feedback: Patient did not attend group.  Plan: Continue to engage patient in RT group sessions 2-3x/week.   Jeoffrey BRAVO Celestia, LRT, CTRS 07/12/2023 4:52 PM

## 2023-07-12 NOTE — Group Note (Signed)
 Date:  07/12/2023 Time:  9:01 PM  Group Topic/Focus:  Wrap-Up Group:   The focus of this group is to help patients review their daily goal of treatment and discuss progress on daily workbooks.    Participation Level:  Active  Participation Quality:  Appropriate  Affect:  Appropriate  Cognitive:  Appropriate  Insight: Appropriate  Engagement in Group:  Engaged  Modes of Intervention:  Discussion  Additional Comments:    Franklin [name] 07/12/2023, 9:01 PM

## 2023-07-12 NOTE — Group Note (Signed)
 Recreation Therapy Group Note   Group Topic:Stress Management  Group Date: 07/12/2023 Start Time: 1100 End Time: 1135 Facilitators: Celestia Jeoffrey BRAVO, LRT, CTRS Location: Dayroom  Group Description: Meditation. LRT educated on the benefits of meditation and relaxation techniques, and how it can apply to everyday life post-discharge. LRT and pt's followed along to an audio script of a "guided meditation" video. LRT facilitated post-activity processing to gain feedback on session.   Goal Area(s) Addressed:  Patient will practice using relaxation technique. Patient will identify a new coping skill.  Patient will follow multistep directions to reduce anxiety and stress.   Affect/Mood: Appropriate   Participation Level: Active   Participation Quality: Independent   Behavior: Calm and Cooperative   Speech/Thought Process: Coherent   Insight: Fair   Judgement: Fair    Modes of Intervention: Education and Exploration   Patient Response to Interventions:  Attentive and Receptive   Education Outcome:  Acknowledges education   Clinical Observations/Individualized Feedback: Franklin [name] was active in their participation of session activities and group discussion. Pt identified that he has done relaxation exercises like this before. Pt followed along to the prompts appropriately.    Plan: Continue to engage patient in RT group sessions 2-3x/week.   Jeoffrey BRAVO Celestia, LRT, CTRS 07/12/2023 2:08 PM

## 2023-07-12 NOTE — Progress Notes (Signed)
   07/11/23 2100  Psych Admission Type (Psych Patients Only)  Admission Status Voluntary  Psychosocial Assessment  Patient Complaints Isolation  Eye Contact Brief  Facial Expression Flat  Affect Flat  Speech Aggressive  Interaction Minimal  Motor Activity Fidgety  Appearance/Hygiene In scrubs  Thought Process  Coherency WDL  Content WDL  Delusions None reported or observed  Perception WDL  Hallucination None reported or observed  Judgment Limited  Confusion None  Danger to Self  Current suicidal ideation? Denies  Agreement Not to Harm Self Yes  Description of Agreement Verbal  Danger to Others  Danger to Others None reported or observed

## 2023-07-13 NOTE — Progress Notes (Signed)
 [facility] MD Progress Note  07/13/2023 9:46 PM Olliver Boyadjian  MRN:  [id number]  Patient is a 53 year old man with history of MDD, PTSD; alcohol  use disorder,stimulant use disorder whom presented to ED with c/o active suicidal ideation with plan to jump off a bridge. Patient is admitted to [facility] unit with Q15 min safety monitoring. Multidisciplinary team approach is offered. Medication management; group/milieu therapy is offered.   Subjective:  Chart reviewed, case discussed in multidisciplinary meeting, patient seen during rounds.  It is noted to be resting in bed.  He reports that he is about to call the rehab center to see if we can complete his initial assessment.  He reports that he participated in few groups as he was locked out of his room yesterday.  He reports feeling better and denies SI/HI/plan and denies hallucinations.  He denies auditory/visual hallucinations.  He reports having fair appetite and sleep.  He offers no other concerns    Sleep: Fair  Appetite:  Fair  Past Psychiatric History: see h&P Family History:  Family History  Problem Relation Age of Onset   Hypertension Other    Diabetes Other    Social History:  Social History   Substance and Sexual Activity  Alcohol  Use Yes   Alcohol /week: 12.0 standard drinks of alcohol    Types: 12 Cans of beer per week     Social History   Substance and Sexual Activity  Drug Use Yes   Types: Crack cocaine, Cocaine   Comment: one gram daily    Social History   Socioeconomic History   Marital status: Single    Spouse name: Not on file   Number of children: Not on file   Years of education: Not on file   Highest education level: Not on file  Occupational History   Occupation: [employer]  Tobacco Use   Smoking status: Some Days    Current packs/day: 0.50    Types: Cigarettes   Smokeless tobacco: Never  Vaping Use   Vaping status: Never Used  Substance and Sexual Activity   Alcohol  use: Yes    Alcohol /week: 12.0  standard drinks of alcohol     Types: 12 Cans of beer per week   Drug use: Yes    Types: Crack cocaine, Cocaine    Comment: one gram daily   Sexual activity: Yes  Other Topics Concern   Not on file  Social History Narrative   The patient was born and raised in with that by both his biological parents. His father's passed away but his mother still living. He denies any history of any physical or sexual abuse. He says he completed 2 years of college at [employer]. 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 He has never been married but has a 78 year old son who lives with his mother. He says he does get to see his son and has a relationship with him. He is not currently dating or in a relationship.      The patient does have a history of a DUI and had a court date last Friday for a DUI. He denies any other pending charges.            Social Drivers of Corporate investment banker Strain: Not on file  Food Insecurity: Food Insecurity Present (07/09/2023)   Hunger Vital Sign    Worried About Running Out of Food in the Last Year: Often  true    Ran Out of Food in the Last Year: Often true  Transportation Needs: Unmet Transportation Needs (07/09/2023)   PRAPARE - Administrator, Civil Service (Medical): Yes    Lack of Transportation (Non-Medical): Yes  Physical Activity: Not on file  Stress: Not on file  Social Connections: Socially Isolated (07/09/2023)   Social Connection and Isolation Panel    Frequency of Communication with Friends and Family: Never    Frequency of Social Gatherings with Friends and Family: Never    Attends Religious Services: Never    Database administrator or Organizations: Yes    Attends Engineer, structural: Never    Marital Status: Never married   Past Medical History:  Past Medical History:  Diagnosis Date   A-fib (HCC)    Anxiety    COPD (chronic obstructive  pulmonary disease) (HCC)    Depression    PTSD (post-traumatic stress disorder) 06/11/2023   Substance abuse (HCC)    Vitamin D  insufficiency 06/12/2023    Past Surgical History:  Procedure Laterality Date   NO PAST SURGERIES      Current Medications: Current Facility-Administered Medications  Medication Dose Route Frequency Provider Last Rate Last Admin   acetaminophen  (TYLENOL ) tablet 650 mg  650 mg Oral Q6H PRN Brent, Amanda C, NP       albuterol  (VENTOLIN  HFA) 108 (90 Base) MCG/ACT inhaler 1 puff  1 puff Inhalation Q6H PRN Donnelly Mellow, MD       alum & mag hydroxide-simeth (MAALOX/MYLANTA) 200-200-20 MG/5ML suspension 30 mL  30 mL Oral Q4H PRN Brent, Amanda C, NP       ARIPiprazole  (ABILIFY ) tablet 10 mg  10 mg Oral Daily Greenwood, Howard F, RPH   10 mg at 07/13/23 9083   haloperidol  (HALDOL ) tablet 5 mg  5 mg Oral TID PRN Brent, Amanda C, NP       And   diphenhydrAMINE  (BENADRYL ) capsule 50 mg  50 mg Oral TID PRN Brent, Amanda C, NP       haloperidol  lactate (HALDOL ) injection 5 mg  5 mg Intramuscular TID PRN Brent, Amanda C, NP       And   diphenhydrAMINE  (BENADRYL ) injection 50 mg  50 mg Intramuscular TID PRN Brent, Amanda C, NP       And   LORazepam  (ATIVAN ) injection 2 mg  2 mg Intramuscular TID PRN Brent, Amanda C, NP       haloperidol  lactate (HALDOL ) injection 10 mg  10 mg Intramuscular TID PRN Thresa Alan BROCKS, NP       And   diphenhydrAMINE  (BENADRYL ) injection 50 mg  50 mg Intramuscular TID PRN Brent, Amanda C, NP       And   LORazepam  (ATIVAN ) injection 2 mg  2 mg Intramuscular TID PRN Brent, Amanda C, NP       FLUoxetine  (PROZAC ) capsule 20 mg  20 mg Oral Daily Brent, Amanda C, NP   20 mg at 07/13/23 9083   hydrOXYzine  (ATARAX ) tablet 25 mg  25 mg Oral TID PRN Brent, Amanda C, NP   25 mg at 07/10/23 2203   magnesium  hydroxide (MILK OF MAGNESIA) suspension 30 mL  30 mL Oral Daily PRN Brent, Amanda C, NP       traZODone  (DESYREL ) tablet 50 mg  50 mg Oral QHS PRN  Brent, Amanda C, NP   50 mg at 07/10/23 2203    Lab Results: No results found for this or any previous  visit (from the past 48 hours).  Blood Alcohol  level:  Lab Results  Component Value Date   [facility] <15 06/07/2023   ETH <10 02/27/2023    Metabolic Disorder Labs: Lab Results  Component Value Date   HGBA1C 6.3 (H) 06/11/2023   MPG 134 06/11/2023   MPG 114.02 03/05/2023   No results found for: PROLACTIN Lab Results  Component Value Date   CHOL 184 06/11/2023   TRIG 98 06/11/2023   HDL 50 06/11/2023   CHOLHDL 3.7 06/11/2023   VLDL 20 06/11/2023   LDLCALC 114 (H) 06/11/2023   LDLCALC 105 (H) 03/05/2023    Physical Findings: AIMS:  , ,  ,  ,    CIWA:    COWS:      Psychiatric Specialty Exam:  Presentation  General Appearance:  Appropriate for Environment; Casual  Eye Contact: Minimal  Speech: Normal Rate  Speech Volume: Normal    Mood and Affect  Mood: Depressed; Dysphoric  Affect: Depressed; Flat   Thought Process  Thought Processes: Coherent  Descriptions of Associations:Intact  Orientation:Full (Time, Place and Person)  Thought Content:Illogical  Hallucinations: Denies  Ideas of Reference:None  Suicidal Thoughts: Denies  Homicidal Thoughts: Denies   Sensorium  Memory: Recent Fair; Immediate Fair; Remote Fair  Judgment: Impaired  Insight: Shallow   Executive Functions  Concentration: Fair  Attention Span: Fair  Recall: [employer] of Knowledge: Fair  Language: Fair   Psychomotor Activity  Psychomotor Activity: No data recorded  Musculoskeletal: Strength & Muscle Tone: within normal limits Gait & Station: normal Assets  Assets: Manufacturing systems engineer; Desire for Improvement; Resilience    Physical Exam: Physical Exam Vitals and nursing note reviewed.    ROS Blood pressure 131/82, pulse 72, temperature 98.3 F (36.8 C), resp. rate 16, height 5' 10 (1.778 m), weight 76 kg, SpO2 98%. Body mass  index is 24.03 kg/m.  Diagnosis: Principal Problem:   MDD (major depressive disorder), recurrent episode, severe (HCC) Stimulant use disorder   Clinical Decision Making: Patient with history of depression, alcohol  use, stimulant use disorder is currently admitted for worsening depression and suicidal ideation in the context of multiple psychosocial stressors including homelessness and chronic substance use.   Treatment Plan Summary:   Safety and Monitoring:             -- Voluntary admission to inpatient psychiatric unit for safety, stabilization and treatment             -- Daily contact with patient to assess and evaluate symptoms and progress in treatment             -- Patient's case to be discussed in multi-disciplinary team meeting             -- Observation Level: q15 minute checks             -- Vital signs:  q12 hours             -- Precautions: suicide, elopement, and assault   2. Psychiatric Diagnoses and Treatment:              Continue Prozac  20 mg daily and Abilify  10 mg daily     -- The risks/benefits/side-effects/alternatives to this medication were discussed in detail with the patient and time was given for questions. The patient consents to medication trial.                -- Metabolic profile and EKG monitoring obtained while on an atypical antipsychotic (BMI:  Lipid Panel: HbgA1c: QTc:)              -- Encouraged patient to participate in unit milieu and in scheduled group therapies                            3. Medical Issues Being Addressed:    No acute medical concerns noted at this time.  4. Discharge Planning:   -- Social work and case management to assist with discharge planning and identification of hospital follow-up needs prior to discharge  -- Estimated LOS: 3-4 days  Hatem Cull, MD 07/13/2023, 9:46 PM

## 2023-07-13 NOTE — Progress Notes (Signed)
   07/13/23 1300  Psych Admission Type (Psych Patients Only)  Admission Status Voluntary  Psychosocial Assessment  Patient Complaints None  Eye Contact Brief  Facial Expression Flat  Affect Flat  Speech Logical/coherent  Interaction Minimal  Motor Activity Slow  Appearance/Hygiene In scrubs  Behavior Characteristics Appropriate to situation  Mood Pleasant  Thought Process  Coherency WDL  Content WDL  Delusions None reported or observed  Perception WDL  Hallucination None reported or observed  Judgment Limited  Confusion None  Danger to Self  Current suicidal ideation? Denies  Agreement Not to Harm Self Yes  Description of Agreement verbal  Danger to Others  Danger to Others None reported or observed

## 2023-07-13 NOTE — Plan of Care (Signed)
   Problem: Activity: Goal: Interest or engagement in leisure activities will improve Outcome: Progressing Goal: Imbalance in normal sleep/wake cycle will improve Outcome: Progressing

## 2023-07-13 NOTE — Group Note (Signed)
 Date:  07/13/2023 Time:  11:19 AM  Group Topic/Focus:  Emotional Education:   The focus of this group is to discuss what feelings/emotions are, and how they are experienced.    Participation Level:  Did Not Attend   Franklin [name] 07/13/2023, 11:19 AM

## 2023-07-13 NOTE — Progress Notes (Signed)
   07/13/23 2000  Psych Admission Type (Psych Patients Only)  Admission Status Voluntary  Psychosocial Assessment  Patient Complaints None  Eye Contact Brief  Facial Expression Flat  Affect Flat  Speech Logical/coherent  Interaction Minimal  Motor Activity Slow  Appearance/Hygiene In scrubs  Behavior Characteristics Appropriate to situation  Mood Pleasant  Thought Process  Coherency WDL  Content WDL  Delusions None reported or observed  Perception WDL  Hallucination None reported or observed  Judgment Limited  Confusion None  Danger to Self  Current suicidal ideation? Denies

## 2023-07-13 NOTE — Progress Notes (Signed)
   07/12/23 2000  Psych Admission Type (Psych Patients Only)  Admission Status Voluntary  Psychosocial Assessment  Patient Complaints None  Eye Contact Brief  Facial Expression Flat  Affect Flat  Speech Logical/coherent  Interaction Minimal  Motor Activity Slow  Appearance/Hygiene In scrubs  Behavior Characteristics Cooperative  Mood Pleasant  Thought Process  Coherency WDL  Content WDL  Delusions None reported or observed  Perception WDL  Hallucination None reported or observed  Judgment Limited  Confusion None  Danger to Self  Current suicidal ideation? Denies

## 2023-07-13 NOTE — Plan of Care (Signed)

## 2023-07-13 NOTE — Group Note (Signed)
 Physical/Occupational Therapy Group Note  Group Topic: Transfer Training   Group Date: 07/13/2023 Start Time: 1300 End Time: 1330 Facilitators: Aalani Aikens, Alm Hamilton, PT   Group Description: Group educated on sequence and techniques to maximize safety with functional transfers.  Additionally, integrated education on impact of seating surfaces, use of assistive device and management of orthostasis with movement transitions.  Patients actively engaged with functional transfers (sit/stand) from various seating surfaces, with and without assist devices, working to integrate and retain education provided during session.  Allowed time for questions and further discussion on mobility concerns/needs.   Therapeutic Goal(s):  Identify and demonstrate safe technique for sit/stand transfers from various seating surfaces. Identify and demonstrate safe use of assistive devices with basic transfers and simple mobility. Identify and demonstrate ability to recognize signs/symptoms of orthostasis and appropriate compensatory/safety techniques.  Individual Participation: Did not attend  Participation Level:   Participation Quality:   Behavior:   Speech/Thought Process:   Affect/Mood:   Insight:   Judgement:   Individualization:   Modes of Intervention:   Patient Response to Interventions:    Plan: Continue to engage patient in PT/OT groups 1 - 2x/week.  CHARM Hamilton Bertin PT, DPT 07/13/23, 4:22 PM

## 2023-07-13 NOTE — Group Note (Signed)
 Date:  07/13/2023 Time:  10:21 PM  Group Topic/Focus:  Identifying Needs:   The focus of this group is to help patients identify their personal needs that have been historically problematic and identify healthy behaviors to address their needs.    Participation Level:  Did Not Attend  Participation Quality:  Did Not Attend  Affect:  Did Not Attend  Cognitive:  Did Not Attend  Insight: None  Engagement in Group:  None  Modes of Intervention:  Education  Additional Comments:    Franklin [name] 07/13/2023, 10:21 PM

## 2023-07-14 NOTE — Plan of Care (Signed)
  Problem: Education: Goal: Utilization of techniques to improve thought processes will improve Outcome: Progressing Goal: Knowledge of the prescribed therapeutic regimen will improve Outcome: Progressing   Problem: Activity: Goal: Interest or engagement in leisure activities will improve Outcome: Progressing Goal: Imbalance in normal sleep/wake cycle will improve Outcome: Progressing   Problem: Coping: Goal: Coping ability will improve Outcome: Progressing Goal: Will verbalize feelings Outcome: Progressing   Problem: Health Behavior/Discharge Planning: Goal: Ability to make decisions will improve Outcome: Progressing Goal: Compliance with therapeutic regimen will improve Outcome: Progressing   

## 2023-07-14 NOTE — Group Note (Signed)
 Recreation Therapy Group Note   Group Topic:General Recreation  Group Date: 07/14/2023 Start Time: 1100 End Time: 1135 Facilitators: Celestia Jeoffrey BRAVO, LRT, CTRS Location: Courtyard  Group Description: Outdoor Recreation. Patients had the option to play corn hole, ring toss, bowling or listening to music while outside in the courtyard getting fresh air and sunlight. Patients helped water and prune the raised garden beds. LRT and patients discussed things that they enjoy doing in their free time outside of the hospital. LRT encouraged patients to drink water after being active and getting their heart rate up.   Goal Area(s) Addressed: Patient will identify leisure interests.  Patient will practice healthy decision making. Patient will engage in recreation activity.   Affect/Mood: N/A   Participation Level: Did not attend    Clinical Observations/Individualized Feedback: Patient did not attend group.   Plan: Continue to engage patient in RT group sessions 2-3x/week.   Jeoffrey BRAVO Celestia, LRT, CTRS 07/14/2023 2:06 PM

## 2023-07-14 NOTE — Group Note (Signed)
 [facility] LCSW Group Therapy Note   Group Date: 07/14/2023 Start Time: 1300 End Time: 1345   Type of Therapy/Topic:  Group Therapy:  Emotion Regulation  Participation Level:  Did Not Attend   Mood:  Description of Group:    The purpose of this group is to assist patients in learning to regulate negative emotions and experience positive emotions. Patients will be guided to discuss ways in which they have been vulnerable to their negative emotions. These vulnerabilities will be juxtaposed with experiences of positive emotions or situations, and patients challenged to use positive emotions to combat negative ones. Special emphasis will be placed on coping with negative emotions in conflict situations, and patients will process healthy conflict resolution skills.  Therapeutic Goals: Patient will identify two positive emotions or experiences to reflect on in order to balance out negative emotions:  Patient will label two or more emotions that they find the most difficult to experience:  Patient will be able to demonstrate positive conflict resolution skills through discussion or role plays:   Summary of Patient Progress:   X    Therapeutic Modalities:   Cognitive Behavioral Therapy Feelings Identification Dialectical Behavioral Therapy   Lum [name] Croft, LCSWA

## 2023-07-14 NOTE — Group Note (Signed)
 Recreation Therapy Group Note   Group Topic:Emotion Expression  Group Date: 07/14/2023 Start Time: 1500 End Time: 1550 Facilitators: Celestia Jeoffrey BRAVO, LRT, CTRS Location: Dayroom  Group Description: Painting a Diplomatic Services operational officer. Patients and LRT discuss what it means to be "at peace", what it feels like physically and mentally. Pts are given a canvas and watercolor paint to use and encouraged to draw their idea of a peaceful place. Pts and LRT discuss how they use this in their daily life post discharge. Pts are encouraged to take their canvas home with them as a reminder to find their peaceful place whenever they are feeling depressed, anxious, etc.    Goal Area(s) Addressed:  Patient will identify what it means to experience a "peaceful" emotion. Patient will identify a new coping skill.  Patient will express their emotions through art. Patients will increase communication by talking with LRT and peers while in group.   Affect/Mood: N/A   Participation Level: Did not attend    Clinical Observations/Individualized Feedback: Patient did not attend group.   Plan: Continue to engage patient in RT group sessions 2-3x/week.   Jeoffrey BRAVO Celestia, LRT, CTRS 07/14/2023 4:54 PM

## 2023-07-14 NOTE — Plan of Care (Signed)
  Problem: Education: Goal: Utilization of techniques to improve thought processes will improve Outcome: Progressing   Problem: Activity: Goal: Interest or engagement in leisure activities will improve Outcome: Progressing   Problem: Coping: Goal: Coping ability will improve Outcome: Progressing   

## 2023-07-14 NOTE — [facility] Counselor (Signed)
 CSW spoke to CSW [place] on Tuesday, who reported that she sent paperwork to [facility] for pt's admission.   Pt contacted [facility] on Wed (7/9).   [facility] contacted CSW Michaela who told this Clinical research associate that [facility] asked for pt to call them again because they had one question for him prior to his admission to the program.   CSW informed pt. Pt made contact with [facility].   Pt reported to CSW that [facility] states they are willing to take pt on Monday (7/14) at 9:30 AM with a 28 day supply of medication.   CSW will contact [facility] to confirm.   Lum Croft, MSW, [place] 07/14/2023 8:44 AM

## 2023-07-14 NOTE — [facility] Counselor (Signed)
 CSW contacted [facility] [phone]) to confirm pt's appointment.   Sherry at [facility] reports pt needs to be there at 9:30 AM on Monday with a 28-day supply of medication, she also reports that the address of the facility is [address], [place].   CSW will inform pt's care team.   Lum Croft, MSW, [facility] 07/14/2023 10:39 AM

## 2023-07-14 NOTE — Progress Notes (Signed)
   07/14/23 2119  Psych Admission Type (Psych Patients Only)  Admission Status Voluntary  Psychosocial Assessment  Patient Complaints None  Eye Contact Brief  Facial Expression Flat  Affect Flat  Speech Soft  Interaction Minimal  Motor Activity Slow  Appearance/Hygiene In scrubs  Behavior Characteristics Cooperative;Appropriate to situation  Mood Pleasant  Thought Process  Coherency WDL  Content WDL  Delusions None reported or observed  Perception WDL  Hallucination None reported or observed  Judgment Limited  Confusion None  Danger to Self  Current suicidal ideation? Denies  Agreement Not to Harm Self Yes  Description of Agreement verbal  Danger to Others  Danger to Others None reported or observed

## 2023-07-14 NOTE — Group Note (Signed)
 Date:  07/14/2023 Time:  10:36 AM  Group Topic/Focus:  Movement Therapy    Participation Level:  Did Not Attend    Norleen SHAUNNA Bias 07/14/2023, 10:36 AM

## 2023-07-14 NOTE — Plan of Care (Signed)

## 2023-07-14 NOTE — Progress Notes (Incomplete)
 Pt up to the nurses station requesting snack to put on his stomach. Pt stating that he vomited after dinner. Pt provided a sandwich box. After pt denied nausea and stating I feel relaxed.

## 2023-07-14 NOTE — Progress Notes (Signed)
   07/14/23 1300  Psych Admission Type (Psych Patients Only)  Admission Status Voluntary  Psychosocial Assessment  Patient Complaints None  Eye Contact Brief  Facial Expression Flat  Affect Flat  Speech Soft  Interaction Minimal  Motor Activity Slow  Appearance/Hygiene In scrubs  Behavior Characteristics Cooperative  Mood Depressed;Pleasant  Thought Process  Coherency WDL  Content WDL  Delusions None reported or observed  Perception WDL  Hallucination None reported or observed  Judgment Limited  Confusion None  Danger to Self  Current suicidal ideation? Denies  Agreement Not to Harm Self Yes  Description of Agreement Verbal  Danger to Others  Danger to Others None reported or observed

## 2023-07-14 NOTE — Progress Notes (Signed)
 [facility] MD Progress Note  07/14/2023 12:20 PM Franklin [name]  MRN:  [id number]  Patient is a 53 year old man with history of MDD, PTSD; alcohol  use disorder,stimulant use disorder whom presented to ED with c/o active suicidal ideation with plan to jump off a bridge. Patient is admitted to [facility] unit with Q15 min safety monitoring. Multidisciplinary team approach is offered. Medication management; group/milieu therapy is offered.   Subjective:  Chart reviewed, case discussed in multidisciplinary meeting, patient is noted to be resting in bed.  He reports that he did get accepted to [place] and he needs to be leaving Monday morning.  He reports that he needs 28-day supply of his medications to carry with him.  He reports that his depression and hallucinations improved significantly.  He denies SI/HI/plan and denies auditory/visual hallucinations.  He has fair appetite and sleep.  Per nursing patient remains minimally engaging but has no behavioral problems    Sleep: Fair  Appetite:  Fair  Past Psychiatric History: see h&P Family History:  Family History  Problem Relation Age of Onset   Hypertension Other    Diabetes Other    Social History:  Social History   Substance and Sexual Activity  Alcohol  Use Yes   Alcohol /week: 12.0 standard drinks of alcohol    Types: 12 Cans of beer per week     Social History   Substance and Sexual Activity  Drug Use Yes   Types: Crack cocaine, Cocaine   Comment: one gram daily    Social History   Socioeconomic History   Marital status: Single    Spouse name: Not on file   Number of children: Not on file   Years of education: Not on file   Highest education level: Not on file  Occupational History   Occupation: [employer]  Tobacco Use   Smoking status: Some Days    Current packs/day: 0.50    Types: Cigarettes   Smokeless tobacco: Never  Vaping Use   Vaping status: Never Used  Substance and Sexual Activity   Alcohol  use: Yes    Alcohol /week:  12.0 standard drinks of alcohol     Types: 12 Cans of beer per week   Drug use: Yes    Types: Crack cocaine, Cocaine    Comment: one gram daily   Sexual activity: Yes  Other Topics Concern   Not on file  Social History Narrative   The patient was born and raised in with that by both his biological parents. His father's passed away but his mother still living. He denies any history of any physical or sexual abuse. He says he completed 2 years of college at [employer]. 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 He has never been married but has a 33 year old son who lives with his mother. He says he does get to see his son and has a relationship with him. He is not currently dating or in a relationship.      The patient does have a history of a DUI and had a court date last Friday for a DUI. He denies any other pending charges.            Social Drivers of Corporate investment banker Strain: Not on file  Food Insecurity: Food Insecurity Present (07/09/2023)   Hunger Vital Sign    Worried About Running Out of Food in the Last Year: Often true  Ran Out of Food in the Last Year: Often true  Transportation Needs: Unmet Transportation Needs (07/09/2023)   PRAPARE - Administrator, Civil Service (Medical): Yes    Lack of Transportation (Non-Medical): Yes  Physical Activity: Not on file  Stress: Not on file  Social Connections: Socially Isolated (07/09/2023)   Social Connection and Isolation Panel    Frequency of Communication with Friends and Family: Never    Frequency of Social Gatherings with Friends and Family: Never    Attends Religious Services: Never    Database administrator or Organizations: Yes    Attends Engineer, structural: Never    Marital Status: Never married   Past Medical History:  Past Medical History:  Diagnosis Date   A-fib (HCC)    Anxiety    COPD (chronic obstructive  pulmonary disease) (HCC)    Depression    PTSD (post-traumatic stress disorder) 06/11/2023   Substance abuse (HCC)    Vitamin D  insufficiency 06/12/2023    Past Surgical History:  Procedure Laterality Date   NO PAST SURGERIES      Current Medications: Current Facility-Administered Medications  Medication Dose Route Frequency Provider Last Rate Last Admin   acetaminophen  (TYLENOL ) tablet 650 mg  650 mg Oral Q6H PRN Brent, Amanda C, NP       albuterol  (VENTOLIN  HFA) 108 (90 Base) MCG/ACT inhaler 1 puff  1 puff Inhalation Q6H PRN Jona Zappone, MD       alum & mag hydroxide-simeth (MAALOX/MYLANTA) 200-200-20 MG/5ML suspension 30 mL  30 mL Oral Q4H PRN Brent, Amanda C, NP       ARIPiprazole  (ABILIFY ) tablet 10 mg  10 mg Oral Daily Greenwood, Howard F, RPH   10 mg at 07/14/23 9045   haloperidol  (HALDOL ) tablet 5 mg  5 mg Oral TID PRN Brent, Amanda C, NP       And   diphenhydrAMINE  (BENADRYL ) capsule 50 mg  50 mg Oral TID PRN Brent, Amanda C, NP       haloperidol  lactate (HALDOL ) injection 5 mg  5 mg Intramuscular TID PRN Brent, Amanda C, NP       And   diphenhydrAMINE  (BENADRYL ) injection 50 mg  50 mg Intramuscular TID PRN Brent, Amanda C, NP       And   LORazepam  (ATIVAN ) injection 2 mg  2 mg Intramuscular TID PRN Brent, Amanda C, NP       haloperidol  lactate (HALDOL ) injection 10 mg  10 mg Intramuscular TID PRN Thresa Alan BROCKS, NP       And   diphenhydrAMINE  (BENADRYL ) injection 50 mg  50 mg Intramuscular TID PRN Brent, Amanda C, NP       And   LORazepam  (ATIVAN ) injection 2 mg  2 mg Intramuscular TID PRN Brent, Amanda C, NP       FLUoxetine  (PROZAC ) capsule 20 mg  20 mg Oral Daily Brent, Amanda C, NP   20 mg at 07/14/23 0954   hydrOXYzine  (ATARAX ) tablet 25 mg  25 mg Oral TID PRN Brent, Amanda C, NP   25 mg at 07/10/23 2203   magnesium  hydroxide (MILK OF MAGNESIA) suspension 30 mL  30 mL Oral Daily PRN Brent, Amanda C, NP       traZODone  (DESYREL ) tablet 50 mg  50 mg Oral QHS PRN  Brent, Amanda C, NP   50 mg at 07/10/23 2203    Lab Results: No results found for this or any previous visit (from the past  48 hours).  Blood Alcohol  level:  Lab Results  Component Value Date   [facility] <15 06/07/2023   ETH <10 02/27/2023    Metabolic Disorder Labs: Lab Results  Component Value Date   HGBA1C 6.3 (H) 06/11/2023   MPG 134 06/11/2023   MPG 114.02 03/05/2023   No results found for: PROLACTIN Lab Results  Component Value Date   CHOL 184 06/11/2023   TRIG 98 06/11/2023   HDL 50 06/11/2023   CHOLHDL 3.7 06/11/2023   VLDL 20 06/11/2023   LDLCALC 114 (H) 06/11/2023   LDLCALC 105 (H) 03/05/2023    Physical Findings: AIMS:  , ,  ,  ,    CIWA:    COWS:      Psychiatric Specialty Exam:  Presentation  General Appearance:  Appropriate for Environment; Casual  Eye Contact: Minimal  Speech: Normal Rate  Speech Volume: Normal    Mood and Affect  Mood: Depressed; Dysphoric  Affect: Depressed; Flat   Thought Process  Thought Processes: Coherent  Descriptions of Associations:Intact  Orientation:Full (Time, Place and Person)  Thought Content:Illogical  Hallucinations: Denies  Ideas of Reference:None  Suicidal Thoughts: Denies  Homicidal Thoughts: Denies   Sensorium  Memory: Recent Fair; Immediate Fair; Remote Fair  Judgment: Impaired  Insight: Shallow   Executive Functions  Concentration: Fair  Attention Span: Fair  Recall: [employer] of Knowledge: Fair  Language: Fair   Psychomotor Activity  Psychomotor Activity: No data recorded  Musculoskeletal: Strength & Muscle Tone: within normal limits Gait & Station: normal Assets  Assets: Manufacturing systems engineer; Desire for Improvement; Resilience    Physical Exam: Physical Exam Vitals and nursing note reviewed.    ROS Blood pressure (!) 141/84, pulse (!) 53, temperature 97.6 F (36.4 C), resp. rate 20, height 5' 10 (1.778 m), weight 76 kg, SpO2 98%. Body  mass index is 24.03 kg/m.  Diagnosis: Principal Problem:   MDD (major depressive disorder), recurrent episode, severe (HCC) Stimulant use disorder   Clinical Decision Making: Patient with history of depression, alcohol  use, stimulant use disorder is currently admitted for worsening depression and suicidal ideation in the context of multiple psychosocial stressors including homelessness and chronic substance use.   Treatment Plan Summary:   Safety and Monitoring:             -- Voluntary admission to inpatient psychiatric unit for safety, stabilization and treatment             -- Daily contact with patient to assess and evaluate symptoms and progress in treatment             -- Patient's case to be discussed in multi-disciplinary team meeting             -- Observation Level: q15 minute checks             -- Vital signs:  q12 hours             -- Precautions: suicide, elopement, and assault   2. Psychiatric Diagnoses and Treatment:              Continue Prozac  20 mg daily and Abilify  10 mg daily     -- The risks/benefits/side-effects/alternatives to this medication were discussed in detail with the patient and time was given for questions. The patient consents to medication trial.                -- Metabolic profile and EKG monitoring obtained while on an atypical antipsychotic (BMI: Lipid Panel:  HbgA1c: QTc:)              -- Encouraged patient to participate in unit milieu and in scheduled group therapies                            3. Medical Issues Being Addressed:    No acute medical concerns noted at this time.  4. Discharge Planning:   -- Social work and case management to assist with discharge planning and identification of hospital follow-up needs prior to discharge  -- Estimated LOS: 3-4 days  Allyn Foil, MD 07/14/2023, 12:20 PM

## 2023-07-15 ENCOUNTER — Other Ambulatory Visit: Payer: Self-pay

## 2023-07-15 DIAGNOSIS — F1590 Other stimulant use, unspecified, uncomplicated: Secondary | ICD-10-CM

## 2023-07-15 MED ORDER — FLUTICASONE FUROATE-VILANTEROL 100-25 MCG/ACT IN AEPB
1.0000 | INHALATION_SPRAY | Freq: Every day | RESPIRATORY_TRACT | 0 refills | Status: AC
  Filled 2023-07-15: qty 60, 30d supply, fill #0

## 2023-07-15 MED ORDER — GABAPENTIN 300 MG PO CAPS: 300.0000 mg | ORAL_CAPSULE | Freq: Three times a day (TID) | ORAL | 0 refills | Status: AC

## 2023-07-15 MED ORDER — FLUOXETINE HCL 20 MG PO CAPS
20.0000 mg | ORAL_CAPSULE | Freq: Every day | ORAL | 0 refills | Status: AC
  Filled 2023-07-15: qty 30, 30d supply, fill #0

## 2023-07-15 MED ORDER — ARIPIPRAZOLE 10 MG PO TABS
10.0000 mg | ORAL_TABLET | Freq: Every day | ORAL | 0 refills | Status: AC
  Filled 2023-07-15: qty 30, 30d supply, fill #0

## 2023-07-15 MED ORDER — ALBUTEROL SULFATE HFA 108 (90 BASE) MCG/ACT IN AERS
1.0000 | INHALATION_SPRAY | Freq: Four times a day (QID) | RESPIRATORY_TRACT | 0 refills | Status: AC | PRN
  Filled 2023-07-15: qty 6.7, 50d supply, fill #0

## 2023-07-15 NOTE — Progress Notes (Signed)
 [facility] MD Progress Note  07/15/2023 11:56 AM Franklin [name]  MRN:  [id number]  Patient is a 53 year old man with history of MDD, PTSD; alcohol  use disorder,stimulant use disorder whom presented to ED with c/o active suicidal ideation with plan to jump off a bridge. Patient is admitted to [facility] unit with Q15 min safety monitoring. Multidisciplinary team approach is offered. Medication management; group/milieu therapy is offered.   Subjective:  Chart reviewed, case discussed in multidisciplinary meeting, patient is noted to be resting in day area.  He offers no complaints and reports feeling better.  He wanted to be educated on his medications as he is transitioning to the substance use rehab on Monday.  Patient informed him that he will be provided with 30-day supply of his medications including his inhaler by Monday.  He denies SI/HI/plan.  He denies auditory/visual hallucinations.  He offers no other complaints.   Sleep: Fair  Appetite:  Fair  Past Psychiatric History: see h&P Family History:  Family History  Problem Relation Age of Onset   Hypertension Other    Diabetes Other    Social History:  Social History   Substance and Sexual Activity  Alcohol  Use Yes   Alcohol /week: 12.0 standard drinks of alcohol    Types: 12 Cans of beer per week     Social History   Substance and Sexual Activity  Drug Use Yes   Types: Crack cocaine, Cocaine   Comment: one gram daily    Social History   Socioeconomic History   Marital status: Single    Spouse name: Not on file   Number of children: Not on file   Years of education: Not on file   Highest education level: Not on file  Occupational History   Occupation: [employer]  Tobacco Use   Smoking status: Some Days    Current packs/day: 0.50    Types: Cigarettes   Smokeless tobacco: Never  Vaping Use   Vaping status: Never Used  Substance and Sexual Activity   Alcohol  use: Yes    Alcohol /week: 12.0 standard drinks of alcohol     Types:  12 Cans of beer per week   Drug use: Yes    Types: Crack cocaine, Cocaine    Comment: one gram daily   Sexual activity: Yes  Other Topics Concern   Not on file  Social History Narrative   The patient was born and raised in with that by both his biological parents. His father's passed away but his mother still living. He denies any history of any physical or sexual abuse. He says he completed 2 years of college at [employer]. 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 He has never been married but has a 69 year old son who lives with his mother. He says he does get to see his son and has a relationship with him. He is not currently dating or in a relationship.      The patient does have a history of a DUI and had a court date last Friday for a DUI. He denies any other pending charges.            Social Drivers of Corporate investment banker Strain: Not on file  Food Insecurity: Food Insecurity Present (07/09/2023)   Hunger Vital Sign    Worried About Running Out of Food in the Last Year: Often true    Ran Out of Food in the  Last Year: Often true  Transportation Needs: Unmet Transportation Needs (07/09/2023)   PRAPARE - Administrator, Civil Service (Medical): Yes    Lack of Transportation (Non-Medical): Yes  Physical Activity: Not on file  Stress: Not on file  Social Connections: Socially Isolated (07/09/2023)   Social Connection and Isolation Panel    Frequency of Communication with Friends and Family: Never    Frequency of Social Gatherings with Friends and Family: Never    Attends Religious Services: Never    Database administrator or Organizations: Yes    Attends Engineer, structural: Never    Marital Status: Never married   Past Medical History:  Past Medical History:  Diagnosis Date   A-fib (HCC)    Anxiety    COPD (chronic obstructive pulmonary disease) (HCC)    Depression     PTSD (post-traumatic stress disorder) 06/11/2023   Substance abuse (HCC)    Vitamin D  insufficiency 06/12/2023    Past Surgical History:  Procedure Laterality Date   NO PAST SURGERIES      Current Medications: Current Facility-Administered Medications  Medication Dose Route Frequency Provider Last Rate Last Admin   acetaminophen  (TYLENOL ) tablet 650 mg  650 mg Oral Q6H PRN Brent, Amanda C, NP       albuterol  (VENTOLIN  HFA) 108 (90 Base) MCG/ACT inhaler 1 puff  1 puff Inhalation Q6H PRN Desman Polak, MD   1 puff at 07/15/23 1012   alum & mag hydroxide-simeth (MAALOX/MYLANTA) 200-200-20 MG/5ML suspension 30 mL  30 mL Oral Q4H PRN Brent, Amanda C, NP       ARIPiprazole  (ABILIFY ) tablet 10 mg  10 mg Oral Daily Niels Barrio F, RPH   10 mg at 07/15/23 1009   haloperidol  (HALDOL ) tablet 5 mg  5 mg Oral TID PRN Brent, Amanda C, NP       And   diphenhydrAMINE  (BENADRYL ) capsule 50 mg  50 mg Oral TID PRN Brent, Amanda C, NP       haloperidol  lactate (HALDOL ) injection 5 mg  5 mg Intramuscular TID PRN Brent, Amanda C, NP       And   diphenhydrAMINE  (BENADRYL ) injection 50 mg  50 mg Intramuscular TID PRN Brent, Amanda C, NP       And   LORazepam  (ATIVAN ) injection 2 mg  2 mg Intramuscular TID PRN Brent, Amanda C, NP       haloperidol  lactate (HALDOL ) injection 10 mg  10 mg Intramuscular TID PRN Thresa Alan BROCKS, NP       And   diphenhydrAMINE  (BENADRYL ) injection 50 mg  50 mg Intramuscular TID PRN Brent, Amanda C, NP       And   LORazepam  (ATIVAN ) injection 2 mg  2 mg Intramuscular TID PRN Brent, Amanda C, NP       FLUoxetine  (PROZAC ) capsule 20 mg  20 mg Oral Daily Brent, Amanda C, NP   20 mg at 07/15/23 1009   hydrOXYzine  (ATARAX ) tablet 25 mg  25 mg Oral TID PRN Brent, Amanda C, NP   25 mg at 07/10/23 2203   magnesium  hydroxide (MILK OF MAGNESIA) suspension 30 mL  30 mL Oral Daily PRN Brent, Amanda C, NP       traZODone  (DESYREL ) tablet 50 mg  50 mg Oral QHS PRN Brent, Amanda C, NP    50 mg at 07/10/23 2203    Lab Results: No results found for this or any previous visit (from the past 48 hours).  Blood Alcohol  level:  Lab Results  Component Value Date   [facility] <15 06/07/2023   ETH <10 02/27/2023    Metabolic Disorder Labs: Lab Results  Component Value Date   HGBA1C 6.3 (H) 06/11/2023   MPG 134 06/11/2023   MPG 114.02 03/05/2023   No results found for: PROLACTIN Lab Results  Component Value Date   CHOL 184 06/11/2023   TRIG 98 06/11/2023   HDL 50 06/11/2023   CHOLHDL 3.7 06/11/2023   VLDL 20 06/11/2023   LDLCALC 114 (H) 06/11/2023   LDLCALC 105 (H) 03/05/2023    Physical Findings: AIMS:  , ,  ,  ,    CIWA:    COWS:      Psychiatric Specialty Exam:  Presentation  General Appearance:  Appropriate for Environment; Casual  Eye Contact: Minimal  Speech: Normal Rate  Speech Volume: Normal    Mood and Affect  Mood: improved Affect: euthymic  Thought Process  Thought Processes: Coherent  Descriptions of Associations:Intact  Orientation:Full (Time, Place and Person)  Thought Content:logical Hallucinations: Denies  Ideas of Reference:None  Suicidal Thoughts: Denies  Homicidal Thoughts: Denies   Sensorium  Memory: Recent Fair; Immediate Fair; Remote Fair  Judgment: fair Insight: fair  Art therapist  Concentration: Fair  Attention Span: Fair  Recall: [employer] of Knowledge: Fair  Language: Fair   Psychomotor Activity  Psychomotor Activity: normal  Musculoskeletal: Strength & Muscle Tone: within normal limits Gait & Station: normal Assets  Assets: Manufacturing systems engineer; Desire for Improvement; Resilience    Physical Exam: Physical Exam Vitals and nursing note reviewed.    ROS Blood pressure 129/72, pulse 61, temperature 98.1 F (36.7 C), resp. rate 18, height 5' 10 (1.778 m), weight 76 kg, SpO2 93%. Body mass index is 24.03 kg/m.  Diagnosis: Principal Problem:   MDD (major  depressive disorder), recurrent episode, severe (HCC) Stimulant use disorder   Clinical Decision Making: Patient with history of depression, alcohol  use, stimulant use disorder is currently admitted for worsening depression and suicidal ideation in the context of multiple psychosocial stressors including homelessness and chronic substance use.  Patient showed improvement with the current medication regimen and is being monitored for any side effects including EPS.  Patient is accepted to [place] substance use rehab on Monday.   Treatment Plan Summary:   Safety and Monitoring:             -- Voluntary admission to inpatient psychiatric unit for safety, stabilization and treatment             -- Daily contact with patient to assess and evaluate symptoms and progress in treatment             -- Patient's case to be discussed in multi-disciplinary team meeting             -- Observation Level: q15 minute checks             -- Vital signs:  q12 hours             -- Precautions: suicide, elopement, and assault   2. Psychiatric Diagnoses and Treatment:              Continue Prozac  20 mg daily and Abilify  10 mg daily     -- The risks/benefits/side-effects/alternatives to this medication were discussed in detail with the patient and time was given for questions. The patient consents to medication trial.                --  Metabolic profile and EKG monitoring obtained while on an atypical antipsychotic (BMI: Lipid Panel: HbgA1c: QTc:)              -- Encouraged patient to participate in unit milieu and in scheduled group therapies                            3. Medical Issues Being Addressed:    No acute medical concerns noted at this time.  4. Discharge Planning:   -- Social work and case management to assist with discharge planning and identification of hospital follow-up needs prior to discharge  -- Estimated LOS: 3days  Allyn Foil, MD 07/15/2023, 11:56 AM

## 2023-07-15 NOTE — Plan of Care (Signed)
  Problem: Activity: Goal: Interest or engagement in leisure activities will improve Outcome: Progressing Goal: Imbalance in normal sleep/wake cycle will improve Outcome: Progressing   Problem: Coping: Goal: Coping ability will improve Outcome: Progressing

## 2023-07-15 NOTE — Progress Notes (Signed)
   07/15/23 1400  Psych Admission Type (Psych Patients Only)  Admission Status Voluntary  Psychosocial Assessment  Patient Complaints None  Eye Contact Brief  Facial Expression Flat  Affect Flat  Speech Soft  Interaction Minimal  Motor Activity Slow  Appearance/Hygiene In scrubs  Behavior Characteristics Cooperative;Appropriate to situation  Mood Pleasant  Thought Process  Coherency WDL  Content WDL  Delusions None reported or observed  Perception WDL  Hallucination None reported or observed  Judgment Limited  Confusion None  Danger to Self  Current suicidal ideation? Denies  Agreement Not to Harm Self Yes  Description of Agreement Verbal  Danger to Others  Danger to Others None reported or observed

## 2023-07-15 NOTE — Group Note (Signed)
 Date:  07/15/2023 Time:  10:58 AM  Group Topic/Focus:  Fresh air Therapy with music and conversation.    Participation Level:  Active  Participation Quality:  Appropriate  Affect:  Appropriate  Cognitive:  Appropriate  Insight: Appropriate  Engagement in Group:  Engaged  Modes of Intervention:  Socialization and music  Additional Comments:  none  Norleen SHAUNNA Bias 07/15/2023, 10:58 AM

## 2023-07-15 NOTE — Group Note (Signed)
 Physical/Occupational Therapy Group Note  Group Topic: Pain Management and Coping   Group Date: 07/15/2023 Start Time: 1300 End Time: 1345 Facilitators: Clive Warren CROME, OT   Group Description:  Group discussed impact of chronic/acute pain on safety and independence with functional tasks and impact on mental health.  Identified and discussed any previously learned or implemented strategies used.  Discussed and reviewed cognitive behavioral pain coping strategies to address/improve overall management of pain. Discussed relaxation, distraction techniques, cognitive restructuring, activity pacing/energy conservation, environment/home safety modifications, and role of sleep and sleep hygiene. Allowed time for questions and further discussion.    Therapeutic Goal(s):   -  Identify and discuss previously utilized pain coping strategies and implications of pain on function/well-being -  Identify and discuss implementing new cognitive behavioral pain coping strategies into daily routines -  Demonstrate understanding and performance of learned cognitive behavioral pain coping strategies.   Individual Participation: Pt did not attend.  Participation Level: Did not attend   Participation Quality:   Behavior:   Speech/Thought Process:   Affect/Mood:   Insight:   Judgement:   Modes of Intervention:   Patient Response to Interventions:    Plan: Continue to engage patient in PT/OT groups 1 - 2x/week.  Catricia Scheerer R., MPH, MS, OTR/L ascom [phone] 07/15/23, 3:37 PM

## 2023-07-15 NOTE — [facility] Counselor (Signed)
 CSW contacted [employer] and scheduled pt for pick-up at 8:30 AM.   Lum Croft, MSW, [facility] 07/15/2023 1:47 PM

## 2023-07-15 NOTE — Group Note (Signed)
 Date:  07/15/2023 Time:  4:11 PM  Group Topic/Focus:  Bingo Dayroom Activity   This group allows patients to come together and play bingo and also get to know one another while winning small prizes and rewards when they won bingo     Participation Level:  None  Additional Comments Came into group and sat in the corner and read a magazine no participation in this group.   Rori Goar T Zulema 07/15/2023, 4:11 PM

## 2023-07-16 NOTE — Plan of Care (Signed)

## 2023-07-16 NOTE — Group Note (Signed)
 Date:  07/16/2023 Time:  8:27 PM  Group Topic/Focus:  Self Care:   The focus of this group is to help patients understand the importance of self-care in order to improve or restore emotional, physical, spiritual, interpersonal, and financial health.    Participation Level:  Active  Participation Quality:  Appropriate  Affect:  Appropriate  Cognitive:  Appropriate  Insight: Appropriate  Engagement in Group:  Engaged  Modes of Intervention:  Education  Additional Comments:    Laymon ONEIDA Finder 07/16/2023, 8:27 PM

## 2023-07-16 NOTE — Progress Notes (Signed)
 [facility] MD Progress Note  07/16/2023 10:32 AM Franklin [name]  MRN:  [id number]  Patient is a 53 year old man with history of MDD, PTSD; alcohol  use disorder,stimulant use disorder whom presented to ED with c/o active suicidal ideation with plan to jump off a bridge. Patient is admitted to [facility] unit with Q15 min safety monitoring. Multidisciplinary team approach is offered. Medication management; group/milieu therapy is offered.   Subjective:  Chart reviewed, case discussed in multidisciplinary meeting, patient is noted to be resting in his bed in no distress.  He reports that he is relaxing and eagerly awaiting Monday when he transitions to [facility]. Depression and anxiety are manageable at this time and he has no complaints or concerns. Ate 100% of his breakfast. He continues to engage in groups, with staff, and providers appropriately. He has been calm, cooperative and compliant with his treatment plan. He denies SI/HI/plan.  He denies auditory/visual hallucinations.  Sleep: Good  Appetite:  Good  Past Psychiatric History: see h&P Family History:  Family History  Problem Relation Age of Onset   Hypertension Other    Diabetes Other    Social History:  Social History   Substance and Sexual Activity  Alcohol  Use Yes   Alcohol /week: 12.0 standard drinks of alcohol    Types: 12 Cans of beer per week     Social History   Substance and Sexual Activity  Drug Use Yes   Types: Crack cocaine, Cocaine   Comment: one gram daily    Social History   Socioeconomic History   Marital status: Single    Spouse name: Not on file   Number of children: Not on file   Years of education: Not on file   Highest education level: Not on file  Occupational History   Occupation: [employer]  Tobacco Use   Smoking status: Some Days    Current packs/day: 0.50    Types: Cigarettes   Smokeless tobacco: Never  Vaping Use   Vaping status: Never Used  Substance and Sexual Activity   Alcohol  use: Yes     Alcohol /week: 12.0 standard drinks of alcohol     Types: 12 Cans of beer per week   Drug use: Yes    Types: Crack cocaine, Cocaine    Comment: one gram daily   Sexual activity: Yes  Other Topics Concern   Not on file  Social History Narrative   The patient was born and raised in with that by both his biological parents. His father's passed away but his mother still living. He denies any history of any physical or sexual abuse. He says he completed 2 years of college at [employer]. 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 He has never been married but has a 88 year old son who lives with his mother. He says he does get to see his son and has a relationship with him. He is not currently dating or in a relationship.      The patient does have a history of a DUI and had a court date last Friday for a DUI. He denies any other pending charges.            Social Drivers of Corporate investment banker Strain: Not on file  Food Insecurity: Food Insecurity Present (07/09/2023)   Hunger Vital Sign    Worried About Running Out of Food in the Last Year: Often true    Ran Out  of Food in the Last Year: Often true  Transportation Needs: Unmet Transportation Needs (07/09/2023)   PRAPARE - Administrator, Civil Service (Medical): Yes    Lack of Transportation (Non-Medical): Yes  Physical Activity: Not on file  Stress: Not on file  Social Connections: Socially Isolated (07/09/2023)   Social Connection and Isolation Panel    Frequency of Communication with Friends and Family: Never    Frequency of Social Gatherings with Friends and Family: Never    Attends Religious Services: Never    Database administrator or Organizations: Yes    Attends Engineer, structural: Never    Marital Status: Never married   Past Medical History:  Past Medical History:  Diagnosis Date   A-fib (HCC)    Anxiety    COPD  (chronic obstructive pulmonary disease) (HCC)    Depression    PTSD (post-traumatic stress disorder) 06/11/2023   Substance abuse (HCC)    Vitamin D  insufficiency 06/12/2023    Past Surgical History:  Procedure Laterality Date   NO PAST SURGERIES      Current Medications: Current Facility-Administered Medications  Medication Dose Route Frequency Provider Last Rate Last Admin   acetaminophen  (TYLENOL ) tablet 650 mg  650 mg Oral Q6H PRN Brent, Amanda C, NP       albuterol  (VENTOLIN  HFA) 108 (90 Base) MCG/ACT inhaler 1 puff  1 puff Inhalation Q6H PRN Jadapalle, Sree, MD   1 puff at 07/15/23 1012   alum & mag hydroxide-simeth (MAALOX/MYLANTA) 200-200-20 MG/5ML suspension 30 mL  30 mL Oral Q4H PRN Brent, Amanda C, NP       ARIPiprazole  (ABILIFY ) tablet 10 mg  10 mg Oral Daily Greenwood, Howard F, RPH   10 mg at 07/16/23 9081   haloperidol  (HALDOL ) tablet 5 mg  5 mg Oral TID PRN Brent, Amanda C, NP       And   diphenhydrAMINE  (BENADRYL ) capsule 50 mg  50 mg Oral TID PRN Brent, Amanda C, NP       haloperidol  lactate (HALDOL ) injection 5 mg  5 mg Intramuscular TID PRN Brent, Amanda C, NP       And   diphenhydrAMINE  (BENADRYL ) injection 50 mg  50 mg Intramuscular TID PRN Brent, Amanda C, NP       And   LORazepam  (ATIVAN ) injection 2 mg  2 mg Intramuscular TID PRN Brent, Amanda C, NP       haloperidol  lactate (HALDOL ) injection 10 mg  10 mg Intramuscular TID PRN Thresa Alan BROCKS, NP       And   diphenhydrAMINE  (BENADRYL ) injection 50 mg  50 mg Intramuscular TID PRN Brent, Amanda C, NP       And   LORazepam  (ATIVAN ) injection 2 mg  2 mg Intramuscular TID PRN Brent, Amanda C, NP       FLUoxetine  (PROZAC ) capsule 20 mg  20 mg Oral Daily Brent, Amanda C, NP   20 mg at 07/16/23 9081   hydrOXYzine  (ATARAX ) tablet 25 mg  25 mg Oral TID PRN Brent, Amanda C, NP   25 mg at 07/10/23 2203   magnesium  hydroxide (MILK OF MAGNESIA) suspension 30 mL  30 mL Oral Daily PRN Brent, Amanda C, NP       traZODone   (DESYREL ) tablet 50 mg  50 mg Oral QHS PRN Brent, Amanda C, NP   50 mg at 07/10/23 2203    Lab Results: No results found for this or any previous visit (from the  past 48 hours).  Blood Alcohol  level:  Lab Results  Component Value Date   [facility] <15 06/07/2023   ETH <10 02/27/2023    Metabolic Disorder Labs: Lab Results  Component Value Date   HGBA1C 6.3 (H) 06/11/2023   MPG 134 06/11/2023   MPG 114.02 03/05/2023   No results found for: PROLACTIN Lab Results  Component Value Date   CHOL 184 06/11/2023   TRIG 98 06/11/2023   HDL 50 06/11/2023   CHOLHDL 3.7 06/11/2023   VLDL 20 06/11/2023   LDLCALC 114 (H) 06/11/2023   LDLCALC 105 (H) 03/05/2023    Physical Findings: AIMS:  , ,  ,  ,    CIWA:    COWS:      Psychiatric Specialty Exam:  Presentation  General Appearance:  Appropriate for Environment; Casual  Eye Contact: Good  Speech: Normal Rate  Speech Volume: Normal    Mood and Affect  Mood: improved Affect: euthymic  Thought Process  Thought Processes: Coherent  Descriptions of Associations:Intact  Orientation:Full (Time, Place and Person)  Thought Content:logical Hallucinations: Denies  Ideas of Reference:None  Suicidal Thoughts: Denies  Homicidal Thoughts: Denies   Sensorium  Memory: Recent Fair; Immediate Fair; Remote Fair  Judgment: fair Insight: fair  Art therapist  Concentration: Fair  Attention Span: Fair  Recall: [employer] of Knowledge: Fair  Language: Fair   Psychomotor Activity  Psychomotor Activity: normal  Musculoskeletal: Strength & Muscle Tone: within normal limits Gait & Station: normal Assets  Assets: Manufacturing systems engineer; Desire for Improvement; Resilience    Physical Exam: Physical Exam Vitals and nursing note reviewed.    ROS Blood pressure 131/75, pulse 73, temperature 98.2 F (36.8 C), resp. rate 17, height 5' 10 (1.778 m), weight 76 kg, SpO2 99%. Body mass index is  24.03 kg/m.  Diagnosis: Principal Problem:   MDD (major depressive disorder), recurrent episode, severe (HCC) Stimulant use disorder   Clinical Decision Making: Patient with history of depression, alcohol  use, stimulant use disorder is currently admitted for worsening depression and suicidal ideation in the context of multiple psychosocial stressors including homelessness and chronic substance use.  Patient showed improvement with the current medication regimen and is being monitored for any side effects including EPS.  Patient is accepted to [place] substance use rehab on Monday.   Treatment Plan Summary:   Safety and Monitoring:             -- Voluntary admission to inpatient psychiatric unit for safety, stabilization and treatment             -- Daily contact with patient to assess and evaluate symptoms and progress in treatment             -- Patient's case to be discussed in multi-disciplinary team meeting             -- Observation Level: q15 minute checks             -- Vital signs:  q12 hours             -- Precautions: suicide, elopement, and assault   2. Psychiatric Diagnoses and Treatment:              Continue Prozac  20 mg daily and Abilify  10 mg daily     -- The risks/benefits/side-effects/alternatives to this medication were discussed in detail with the patient and time was given for questions. The patient consents to medication trial.                --  Metabolic profile and EKG monitoring obtained while on an atypical antipsychotic (BMI: Lipid Panel: HbgA1c: QTc:)              -- Encouraged patient to participate in unit milieu and in scheduled group therapies                            3. Medical Issues Being Addressed:    No acute medical concerns noted at this time.  4. Discharge Planning:   -- Social work and case management to assist with discharge planning and identification of hospital follow-up needs prior to discharge  -- Estimated LOS: 3days  Daine KATHEE Ober, NP 07/16/2023, 10:32 AM

## 2023-07-16 NOTE — Progress Notes (Signed)
 Patient was up and visible on the unit at the start of the shift. Patient calm and cooperative. No behavioral issues noted/ reported. C/o feeling hungry d/t not eating his dinner because he stomach was slightly upset. A bag meal was retrieved from the kitchen and offered to the patient, in which he was pleased to have. No further complaints voiced. Patient participated in evening group. Retired to bed shortly after attending group. Rested throughout the night. No distress noted/ reported at this time. Patient remains in bed resting, rise and fall of chest noted. All orders maintained as written.    07/16/23 0400  Psych Admission Type (Psych Patients Only)  Admission Status Voluntary  Psychosocial Assessment  Patient Complaints None  Eye Contact Brief  Facial Expression Flat  Affect Flat  Speech Soft  Interaction Minimal  Motor Activity Slow  Appearance/Hygiene In scrubs  Behavior Characteristics Cooperative;Appropriate to situation  Mood Pleasant  Aggressive Behavior  Effect No apparent injury  Thought Process  Coherency WDL  Content WDL  Delusions None reported or observed  Perception WDL  Hallucination None reported or observed  Judgment Limited  Confusion None  Danger to Self  Current suicidal ideation? Denies  Agreement Not to Harm Self Yes  Description of Agreement verbal  Danger to Others  Danger to Others None reported or observed

## 2023-07-16 NOTE — Group Note (Signed)
 Date:  07/16/2023 Time:  10:46 AM  Group Topic/Focus:  Fresh air Therapy wirh music and conversation    Participation Level:  Did Not Attend    Norleen SHAUNNA Bias 07/16/2023, 10:46 AM

## 2023-07-16 NOTE — BH IP Treatment Plan (Signed)
 Interdisciplinary Treatment and Diagnostic Plan Update  07/16/2023 Time of Session: 10:00am Franklin [name] MRN: [id number]  Principal Diagnosis: MDD (major depressive disorder), recurrent episode, severe (HCC)  Secondary Diagnoses: Principal Problem:   MDD (major depressive disorder), recurrent episode, severe (HCC)   Current Medications:  Current Facility-Administered Medications  Medication Dose Route Frequency Provider Last Rate Last Admin   acetaminophen  (TYLENOL ) tablet 650 mg  650 mg Oral Q6H PRN Brent, Amanda C, NP       albuterol  (VENTOLIN  HFA) 108 (90 Base) MCG/ACT inhaler 1 puff  1 puff Inhalation Q6H PRN Jadapalle, Sree, MD   1 puff at 07/15/23 1012   alum & mag hydroxide-simeth (MAALOX/MYLANTA) 200-200-20 MG/5ML suspension 30 mL  30 mL Oral Q4H PRN Brent, Amanda C, NP       ARIPiprazole  (ABILIFY ) tablet 10 mg  10 mg Oral Daily Greenwood, Howard F, RPH   10 mg at 07/16/23 9081   haloperidol  (HALDOL ) tablet 5 mg  5 mg Oral TID PRN Brent, Amanda C, NP       And   diphenhydrAMINE  (BENADRYL ) capsule 50 mg  50 mg Oral TID PRN Brent, Amanda C, NP       haloperidol  lactate (HALDOL ) injection 5 mg  5 mg Intramuscular TID PRN Thresa Alan BROCKS, NP       And   diphenhydrAMINE  (BENADRYL ) injection 50 mg  50 mg Intramuscular TID PRN Thresa Alan BROCKS, NP       And   LORazepam  (ATIVAN ) injection 2 mg  2 mg Intramuscular TID PRN Brent, Amanda C, NP       haloperidol  lactate (HALDOL ) injection 10 mg  10 mg Intramuscular TID PRN Brent, Amanda C, NP       And   diphenhydrAMINE  (BENADRYL ) injection 50 mg  50 mg Intramuscular TID PRN Brent, Amanda C, NP       And   LORazepam  (ATIVAN ) injection 2 mg  2 mg Intramuscular TID PRN Brent, Amanda C, NP       FLUoxetine  (PROZAC ) capsule 20 mg  20 mg Oral Daily Brent, Amanda C, NP   20 mg at 07/16/23 9081   hydrOXYzine  (ATARAX ) tablet 25 mg  25 mg Oral TID PRN Brent, Amanda C, NP   25 mg at 07/10/23 2203   magnesium  hydroxide (MILK OF MAGNESIA) suspension  30 mL  30 mL Oral Daily PRN Brent, Amanda C, NP       traZODone  (DESYREL ) tablet 50 mg  50 mg Oral QHS PRN Brent, Amanda C, NP   50 mg at 07/10/23 2203   PTA Medications: Medications Prior to Admission  Medication Sig Dispense Refill Last Dose/Taking   albuterol  (VENTOLIN  HFA) 108 (90 Base) MCG/ACT inhaler Inhale 2 puffs into the lungs every 6 (six) hours as needed for wheezing or shortness of breath. 8 g 0    [EXPIRED] doxycycline  (VIBRAMYCIN ) 100 MG capsule Take 1 capsule (100 mg total) by mouth 2 (two) times daily for 7 days. (Patient not taking: Reported on 07/09/2023) 14 capsule 0    ipratropium-albuterol  (DUONEB) 0.5-2.5 (3) MG/3ML SOLN Take 3 mLs by nebulization every 6 (six) hours as needed. (Patient taking differently: Take 3 mLs by nebulization every 6 (six) hours as needed (For shortness of breath).) 360 mL 0    mometasone -formoterol  (DULERA ) 200-5 MCG/ACT AERO Inhale 2 puffs into the lungs 2 (two) times daily in the am and at bedtime.. (Patient not taking: Reported on 07/09/2023)      Multiple Vitamin (MULTIVITAMIN WITH MINERALS) TABS  tablet Take 1 tablet by mouth daily.      [EXPIRED] naltrexone  (DEPADE) 50 MG tablet Take 1 tablet (50 mg total) by mouth at bedtime. 30 tablet 0    propranolol  (INDERAL ) 10 MG tablet Take 1 tablet (10 mg total) by mouth 2 (two) times daily. 60 tablet 0    thiamine  (VITAMIN B-1) 100 MG tablet Take 1 tablet (100 mg total) by mouth daily.      traZODone  (DESYREL ) 50 MG tablet Take 1 tablet (50 mg total) by mouth at bedtime as needed for sleep. 30 tablet 0    [EXPIRED] vitamin D3 (CHOLECALCIFEROL ) 25 MCG tablet Take 1 tablet (1,000 Units total) by mouth daily with breakfast. 30 tablet 0    [DISCONTINUED] ARIPiprazole  (ABILIFY ) 10 MG tablet Take 1 tablet (10 mg total) by mouth daily. (Patient not taking: Reported on 07/09/2023) 30 tablet 0    [DISCONTINUED] FLUoxetine  (PROZAC ) 20 MG capsule Take 1 capsule (20 mg total) by mouth daily. (Patient not taking: Reported on  07/09/2023) 30 capsule 0    [DISCONTINUED] fluticasone  furoate-vilanterol (BREO ELLIPTA ) 100-25 MCG/ACT AEPB Inhale 1 puff into the lungs daily. 30 each 0    [DISCONTINUED] gabapentin  (NEURONTIN ) 300 MG capsule Take 1 capsule (300 mg total) by mouth 3 (three) times daily. 90 capsule 0     Patient Stressors: Substance abuse    Patient Strengths: Capable of independent living   Treatment Modalities: Medication Management, Group therapy, Case management,  1 to 1 session with clinician, Psychoeducation, Recreational therapy.   Physician Treatment Plan for Primary Diagnosis: MDD (major depressive disorder), recurrent episode, severe (HCC) Long Term Goal(s): Improvement in symptoms so as ready for discharge   Short Term Goals: Ability to identify changes in lifestyle to reduce recurrence of condition will improve Ability to verbalize feelings will improve Ability to disclose and discuss suicidal ideas Ability to demonstrate self-control will improve Ability to identify and develop effective coping behaviors will improve Ability to maintain clinical measurements within normal limits will improve Ability to identify triggers associated with substance abuse/mental health issues will improve  Medication Management: Evaluate patient's response, side effects, and tolerance of medication regimen.  Therapeutic Interventions: 1 to 1 sessions, Unit Group sessions and Medication administration.  Evaluation of Outcomes: Progressing  Physician Treatment Plan for Secondary Diagnosis: Principal Problem:   MDD (major depressive disorder), recurrent episode, severe (HCC)  Long Term Goal(s): Improvement in symptoms so as ready for discharge   Short Term Goals: Ability to identify changes in lifestyle to reduce recurrence of condition will improve Ability to verbalize feelings will improve Ability to disclose and discuss suicidal ideas Ability to demonstrate self-control will improve Ability to identify  and develop effective coping behaviors will improve Ability to maintain clinical measurements within normal limits will improve Ability to identify triggers associated with substance abuse/mental health issues will improve     Medication Management: Evaluate patient's response, side effects, and tolerance of medication regimen.  Therapeutic Interventions: 1 to 1 sessions, Unit Group sessions and Medication administration.  Evaluation of Outcomes: Progressing   RN Treatment Plan for Primary Diagnosis: MDD (major depressive disorder), recurrent episode, severe (HCC) Long Term Goal(s): Knowledge of disease and therapeutic regimen to maintain health will improve  Short Term Goals: Ability to remain free from injury will improve, Ability to verbalize frustration and anger appropriately will improve, Ability to demonstrate self-control, Ability to participate in decision making will improve, Ability to verbalize feelings will improve, Ability to disclose and discuss suicidal ideas, Ability to  identify and develop effective coping behaviors will improve, and Compliance with prescribed medications will improve  Medication Management: RN will administer medications as ordered by provider, will assess and evaluate patient's response and provide education to patient for prescribed medication. RN will report any adverse and/or side effects to prescribing provider.  Therapeutic Interventions: 1 on 1 counseling sessions, Psychoeducation, Medication administration, Evaluate responses to treatment, Monitor vital signs and CBGs as ordered, Perform/monitor CIWA, COWS, AIMS and Fall Risk screenings as ordered, Perform wound care treatments as ordered.  Evaluation of Outcomes: Progressing   LCSW Treatment Plan for Primary Diagnosis: MDD (major depressive disorder), recurrent episode, severe (HCC) Long Term Goal(s): Safe transition to appropriate next level of care at discharge, Engage patient in therapeutic  group addressing interpersonal concerns.  Short Term Goals: Engage patient in aftercare planning with referrals and resources, Increase social support, Increase ability to appropriately verbalize feelings, Increase emotional regulation, Facilitate acceptance of mental health diagnosis and concerns, Facilitate patient progression through stages of change regarding substance use diagnoses and concerns, Identify triggers associated with mental health/substance abuse issues, and Increase skills for wellness and recovery  Therapeutic Interventions: Assess for all discharge needs, 1 to 1 time with Social worker, Explore available resources and support systems, Assess for adequacy in community support network, Educate family and significant other(s) on suicide prevention, Complete Psychosocial Assessment, Interpersonal group therapy.  Evaluation of Outcomes: Progressing   Progress in Treatment: Attending groups: Yes. Participating in groups: Yes.and no Taking medication as prescribed: Yes. Toleration medication: Yes. Family/Significant other contact made: No, will contact:  Pt declined stating no family support Patient understands diagnosis: Yes. Discussing patient identified problems/goals with staff: Yes. Medical problems stabilized or resolved: Yes. Denies suicidal/homicidal ideation: Yes. Issues/concerns per patient self-inventory: No. Other: none  New problem(s) identified: No, Describe:  none  New Short Term/Long Term Goal(s):detox, elimination of symptoms of psychosis, medication management for mood stabilization; elimination of SI thoughts; development of comprehensive mental wellness/sobriety plan.     Patient Goals:    get back on my meds   Discharge Plan or Barriers: CSW to assist patient in development of appropriate discharge plans. Patient reports that he would like to be discharged to a SUD facility.   Reason for Continuation of Hospitalization:  Anxiety Depression Medication stabilization Suicidal ideation  Estimated Length of Stay:1-7 days  Last 3 [place] Suicide Severity Risk Score: Flowsheet Row Admission (Current) from 07/09/2023 in [facility] BE[facility] Most recent reading at 07/09/2023  2:51 PM ED from 07/09/2023 in [facility] Emergency Department at [facility] Most recent reading at 07/09/2023  7:57 AM ED from 07/09/2023 in [facility] Emergency Department at [facility] Most recent reading at 07/09/2023 12:52 AM  C-SSRS RISK CATEGORY High Risk High Risk No Risk    Last PHQ 2/9 Scores:    06/14/2023   10:09 AM 06/13/2023    1:31 PM 06/08/2023    4:37 PM  Depression screen PHQ 2/9  Decreased Interest 0 2 2  Down, Depressed, Hopeless 0 2 2  PHQ - 2 Score 0 4 4  Altered sleeping 0 2 2  Tired, decreased energy 0 2 2  Change in appetite 0 0 0  Feeling bad or failure about yourself  0 1 1  Trouble concentrating 0 0 0  Moving slowly or fidgety/restless 0 0 0  Suicidal thoughts 0 0 0  PHQ-9 Score 0 9 9  Difficult doing work/chores   Somewhat difficult    Scribe for Treatment Team: Janavia Rottman  Autry Prust, LCSW 07/16/2023 10:01 AM

## 2023-07-17 NOTE — Plan of Care (Signed)

## 2023-07-17 NOTE — Group Note (Signed)
 Date:  07/17/2023 Time:  10:01 PM  Group Topic/Focus:  Wrap-Up Group:   The focus of this group is to help patients review their daily goal of treatment and discuss progress on daily workbooks.    Participation Level:  Active  Participation Quality:  Appropriate  Affect:  Appropriate  Cognitive:  Alert  Insight: Appropriate  Engagement in Group:  Engaged  Modes of Intervention:  Discussion  Additional Comments:    Franklin [name] 07/17/2023, 10:01 PM

## 2023-07-17 NOTE — Progress Notes (Signed)
 Patient spent most of the shift resting in the bed, patient denies SI, HI & AVH. He spent most of the shift resting in the bed quietly. No new behavioral issues to report on shift at this time.

## 2023-07-17 NOTE — Plan of Care (Signed)
   Problem: Safety: Goal: Ability to disclose and discuss suicidal ideas will improve Outcome: Progressing

## 2023-07-17 NOTE — Group Note (Signed)
 Date:  07/17/2023 Time:  11:47 AM  Group Topic/Focus:  Self Esteem Action Plan:   The focus of this group is to help patients create a plan to continue to build self-esteem after discharge.    Participation Level:  Did Not Attend   Franklin [name] 07/17/2023, 11:47 AM

## 2023-07-17 NOTE — Progress Notes (Signed)
 Patient was up and visible on the unit at the start of the shift. Present in the day area, amongst peers. Appropriately interactive with both staff and peers. Calm and cooperative. No complaints voiced. No distress noted/ reported at this time. Consumed evening snack, before retiring to bed for the night. Patient slept throughout the night without any disturbances. All orders maintained as written.    07/16/23 2300  Psych Admission Type (Psych Patients Only)  Admission Status Voluntary  Psychosocial Assessment  Eye Contact Brief  Facial Expression Flat  Affect Flat  Speech Soft  Interaction Minimal  Motor Activity Slow  Appearance/Hygiene In scrubs  Behavior Characteristics Cooperative;Appropriate to situation  Mood Pleasant  Aggressive Behavior  Effect No apparent injury  Thought Process  Coherency WDL  Content WDL  Delusions None reported or observed  Perception WDL  Hallucination None reported or observed  Judgment Limited  Confusion None  Danger to Self  Current suicidal ideation? Denies  Agreement Not to Harm Self Yes  Description of Agreement verbal  Danger to Others  Danger to Others None reported or observed

## 2023-07-17 NOTE — Progress Notes (Signed)
 [facility] MD Progress Note  07/17/2023 10:02 AM Franklin [name]  MRN:  [id number]  Patient is a 53 year old man with history of MDD, PTSD; alcohol  use disorder,stimulant use disorder whom presented to ED with c/o active suicidal ideation with plan to jump off a bridge. Patient is admitted to [facility] unit with Q15 min safety monitoring. Multidisciplinary team approach is offered. Medication management; group/milieu therapy is offered.   Subjective:  Chart reviewed, case discussed in multidisciplinary meeting, patient is noted to be resting in his bed in no distress.  Patient is resting in bed and easily awakened by calling his name. He reports that he is feeling great and continues to be optimistic regarding his transition on Monday. He denies any concerns at this time. Depression and anxiety symptoms are reported to be minimal and manageable. H Ate 100% of his breakfast. He continues to engage in groups, with staff, and providers appropriately. He has been calm, cooperative and compliant with his treatment plan. He denies SI/HI/plan.  He denies auditory/visual hallucinations.  Sleep: Good  Appetite:  Good  Past Psychiatric History: see h&P Family History:  Family History  Problem Relation Age of Onset   Hypertension Other    Diabetes Other    Social History:  Social History   Substance and Sexual Activity  Alcohol  Use Yes   Alcohol /week: 12.0 standard drinks of alcohol    Types: 12 Cans of beer per week     Social History   Substance and Sexual Activity  Drug Use Yes   Types: Crack cocaine, Cocaine   Comment: one gram daily    Social History   Socioeconomic History   Marital status: Single    Spouse name: Not on file   Number of children: Not on file   Years of education: Not on file   Highest education level: Not on file  Occupational History   Occupation: [employer]  Tobacco Use   Smoking status: Some Days    Current packs/day: 0.50    Types: Cigarettes   Smokeless tobacco:  Never  Vaping Use   Vaping status: Never Used  Substance and Sexual Activity   Alcohol  use: Yes    Alcohol /week: 12.0 standard drinks of alcohol     Types: 12 Cans of beer per week   Drug use: Yes    Types: Crack cocaine, Cocaine    Comment: one gram daily   Sexual activity: Yes  Other Topics Concern   Not on file  Social History Narrative   The patient was born and raised in with that by both his biological parents. His father's passed away but his mother still living. He denies any history of any physical or sexual abuse. He says he completed 2 years of college at [employer]. 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 He has never been married but has a 78 year old son who lives with his mother. He says he does get to see his son and has a relationship with him. He is not currently dating or in a relationship.      The patient does have a history of a DUI and had a court date last Friday for a DUI. He denies any other pending charges.            Social Drivers of Corporate investment banker Strain: Not on file  Food Insecurity: Food Insecurity Present (07/09/2023)   Hunger Vital Sign  Worried About Programme researcher, broadcasting/film/video in the Last Year: Often true    Barista in the Last Year: Often true  Transportation Needs: Unmet Transportation Needs (07/09/2023)   PRAPARE - Administrator, Civil Service (Medical): Yes    Lack of Transportation (Non-Medical): Yes  Physical Activity: Not on file  Stress: Not on file  Social Connections: Socially Isolated (07/09/2023)   Social Connection and Isolation Panel    Frequency of Communication with Friends and Family: Never    Frequency of Social Gatherings with Friends and Family: Never    Attends Religious Services: Never    Database administrator or Organizations: Yes    Attends Engineer, structural: Never    Marital Status: Never married    Past Medical History:  Past Medical History:  Diagnosis Date   A-fib (HCC)    Anxiety    COPD (chronic obstructive pulmonary disease) (HCC)    Depression    PTSD (post-traumatic stress disorder) 06/11/2023   Substance abuse (HCC)    Vitamin D  insufficiency 06/12/2023    Past Surgical History:  Procedure Laterality Date   NO PAST SURGERIES      Current Medications: Current Facility-Administered Medications  Medication Dose Route Frequency Provider Last Rate Last Admin   acetaminophen  (TYLENOL ) tablet 650 mg  650 mg Oral Q6H PRN Brent, Amanda C, NP       albuterol  (VENTOLIN  HFA) 108 (90 Base) MCG/ACT inhaler 1 puff  1 puff Inhalation Q6H PRN Jadapalle, Sree, MD   1 puff at 07/15/23 1012   alum & mag hydroxide-simeth (MAALOX/MYLANTA) 200-200-20 MG/5ML suspension 30 mL  30 mL Oral Q4H PRN Brent, Amanda C, NP       ARIPiprazole  (ABILIFY ) tablet 10 mg  10 mg Oral Daily Franklin [name], RPH   10 mg at 07/17/23 0933   haloperidol  (HALDOL ) tablet 5 mg  5 mg Oral TID PRN Brent, Amanda C, NP       And   diphenhydrAMINE  (BENADRYL ) capsule 50 mg  50 mg Oral TID PRN Brent, Amanda C, NP       haloperidol  lactate (HALDOL ) injection 5 mg  5 mg Intramuscular TID PRN Brent, Amanda C, NP       And   diphenhydrAMINE  (BENADRYL ) injection 50 mg  50 mg Intramuscular TID PRN Brent, Amanda C, NP       And   LORazepam  (ATIVAN ) injection 2 mg  2 mg Intramuscular TID PRN Brent, Amanda C, NP       haloperidol  lactate (HALDOL ) injection 10 mg  10 mg Intramuscular TID PRN Brent, Amanda C, NP       And   diphenhydrAMINE  (BENADRYL ) injection 50 mg  50 mg Intramuscular TID PRN Brent, Amanda C, NP       And   LORazepam  (ATIVAN ) injection 2 mg  2 mg Intramuscular TID PRN Brent, Amanda C, NP       FLUoxetine  (PROZAC ) capsule 20 mg  20 mg Oral Daily Brent, Amanda C, NP   20 mg at 07/17/23 0933   hydrOXYzine  (ATARAX ) tablet 25 mg  25 mg Oral TID PRN Brent, Amanda C, NP   25 mg at 07/10/23 2203   magnesium   hydroxide (MILK OF MAGNESIA) suspension 30 mL  30 mL Oral Daily PRN Brent, Amanda C, NP       traZODone  (DESYREL ) tablet 50 mg  50 mg Oral QHS PRN Brent, Amanda C, NP   50 mg at 07/10/23  2203    Lab Results: No results found for this or any previous visit (from the past 48 hours).  Blood Alcohol  level:  Lab Results  Component Value Date   [facility] <15 06/07/2023   ETH <10 02/27/2023    Metabolic Disorder Labs: Lab Results  Component Value Date   HGBA1C 6.3 (H) 06/11/2023   MPG 134 06/11/2023   MPG 114.02 03/05/2023   No results found for: PROLACTIN Lab Results  Component Value Date   CHOL 184 06/11/2023   TRIG 98 06/11/2023   HDL 50 06/11/2023   CHOLHDL 3.7 06/11/2023   VLDL 20 06/11/2023   LDLCALC 114 (H) 06/11/2023   LDLCALC 105 (H) 03/05/2023    Physical Findings: AIMS:  , ,  ,  ,    CIWA:    COWS:      Psychiatric Specialty Exam:  Presentation  General Appearance:  Appropriate for Environment; Neat  Eye Contact: Good  Speech: Normal Rate  Speech Volume: Normal    Mood and Affect  Mood: improved Affect: euthymic  Thought Process  Thought Processes: Coherent; Linear  Descriptions of Associations:Intact  Orientation:Full (Time, Place and Person)  Thought Content:logical Hallucinations: Denies  Ideas of Reference:None  Suicidal Thoughts: Denies  Homicidal Thoughts: Denies   Sensorium  Memory: Immediate Good; Recent Good; Remote Good  Judgment: fair Insight: fair  Executive Functions  Concentration: Good  Attention Span: Good  Recall: Good  Fund of Knowledge: Good  Language: Good   Psychomotor Activity  Psychomotor Activity: normal  Musculoskeletal: Strength & Muscle Tone: within normal limits Gait & Station: normal Assets  Assets: Desire for Improvement    Physical Exam: Physical Exam Vitals and nursing note reviewed.    ROS Blood pressure 117/79, pulse 61, temperature 98.8 F (37.1 C), resp.  rate 18, height 5' 10 (1.778 m), weight 76 kg, SpO2 100%. Body mass index is 24.03 kg/m.  Diagnosis: Principal Problem:   MDD (major depressive disorder), recurrent episode, severe (HCC) Stimulant use disorder   Clinical Decision Making: Patient with history of depression, alcohol  use, stimulant use disorder is currently admitted for worsening depression and suicidal ideation in the context of multiple psychosocial stressors including homelessness and chronic substance use.  Patient showed improvement with the current medication regimen and is being monitored for any side effects including EPS.  Patient is accepted to [place] substance use rehab on Monday.   Treatment Plan Summary:   Safety and Monitoring:             -- Voluntary admission to inpatient psychiatric unit for safety, stabilization and treatment             -- Daily contact with patient to assess and evaluate symptoms and progress in treatment             -- Patient's case to be discussed in multi-disciplinary team meeting             -- Observation Level: q15 minute checks             -- Vital signs:  q12 hours             -- Precautions: suicide, elopement, and assault   2. Psychiatric Diagnoses and Treatment:              Continue Prozac  20 mg daily and Abilify  10 mg daily     -- The risks/benefits/side-effects/alternatives to this medication were discussed in detail with the patient and time was given for questions. The  patient consents to medication trial.                -- Metabolic profile and EKG monitoring obtained while on an atypical antipsychotic (BMI: Lipid Panel: HbgA1c: QTc:)              -- Encouraged patient to participate in unit milieu and in scheduled group therapies                            3. Medical Issues Being Addressed:    No acute medical concerns noted at this time.  4. Discharge Planning:   -- Social work and case management to assist with discharge planning and identification of hospital  follow-up needs prior to discharge  -- Discharge planned for Monday 07/18/2023  Daine KATHEE Ober, NP 07/17/2023, 10:02 AM

## 2023-07-17 NOTE — Group Note (Signed)
 LCSW Group Therapy Note  Group Date: 07/17/2023 Start Time: 1300 End Time: 1400   Type of Therapy and Topic:  Group Therapy - Healthy vs Unhealthy Coping Skills  Participation Level:  Did Not Attend   Description of Group The focus of this group was to determine what unhealthy coping techniques typically are used by group members and what healthy coping techniques would be helpful in coping with various problems. Patients were guided in becoming aware of the differences between healthy and unhealthy coping techniques. Patients were asked to identify 2-3 healthy coping skills they would like to learn to use more effectively.  Therapeutic Goals Patients learned that coping is what human beings do all day long to deal with various situations in their lives Patients defined and discussed healthy vs unhealthy coping techniques Patients identified their preferred coping techniques and identified whether these were healthy or unhealthy Patients determined 2-3 healthy coping skills they would like to become more familiar with and use more often. Patients provided support and ideas to each other   Summary of Patient Progress:  Did Not Attend   Therapeutic Modalities Cognitive Behavioral Therapy Motivational Interviewing  Aldo CHRISTELLA Niece, [place] 07/17/2023  2:08 PM

## 2023-07-18 NOTE — BHH Counselor (Signed)
 CSW received a call from Joyce at [facility] who reports that pt asked the driver, Olam to go to the restroom.   Olam then stopped at a [employer] station (off [address]) heading to [employer].   Merlynn and Olam report that pt grabbed his belongings and said he was not going to the destination and began to walk.   CSW asked Olam if pt was close enough that CSW could speak with him.   Olam reports pt has already walked too far and she can no longer see where he went.   CSW informed nursing staff as well as provider.   CSW contacted [facility] Supervisor Massie Silvius, CSW currently awaiting response.   Lum Croft, MSW, [place] 07/18/2023 9:32 AM

## 2023-07-18 NOTE — Plan of Care (Signed)
  Problem: Education: Goal: Utilization of techniques to improve thought processes will improve Outcome: Progressing Goal: Knowledge of the prescribed therapeutic regimen will improve Outcome: Progressing   Problem: Coping: Goal: Coping ability will improve Outcome: Progressing Goal: Will verbalize feelings Outcome: Progressing   Problem: Safety: Goal: Ability to disclose and discuss suicidal ideas will improve Outcome: Progressing

## 2023-07-18 NOTE — BHH Suicide Risk Assessment (Signed)
 [facility] Discharge Suicide Risk Assessment   Principal Problem: MDD (major depressive disorder), recurrent episode, severe (HCC) Discharge Diagnoses: Principal Problem:   MDD (major depressive disorder), recurrent episode, severe (HCC)   Total Time spent with patient: 30 minutes  Musculoskeletal: Strength & Muscle Tone: within normal limits Gait & Station: normal Patient leans: N/A  Psychiatric Specialty Exam  Presentation  General Appearance:  Appropriate for Environment; Casual  Eye Contact: Fair  Speech: Clear and Coherent  Speech Volume: Normal  Handedness: Right   Mood and Affect  Mood: Euthymic  Duration of Depression Symptoms: Greater than two weeks  Affect: Appropriate   Thought Process  Thought Processes: Coherent  Descriptions of Associations:Intact  Orientation:Full (Time, Place and Person)  Thought Content:Logical  History of Schizophrenia/Schizoaffective disorder:No  Duration of Psychotic Symptoms:Less than six months  Hallucinations:Hallucinations: None  Ideas of Reference:None  Suicidal Thoughts:Suicidal Thoughts: No  Homicidal Thoughts:Homicidal Thoughts: No   Sensorium  Memory: Immediate Fair; Recent Fair; Remote Fair  Judgment: Fair  Insight: Fair   Art therapist  Concentration: Fair  Attention Span: Fair  Recall: [employer] of Knowledge: Fair  Language: Fair   Psychomotor Activity  Psychomotor Activity: Psychomotor Activity: Normal   Assets  Assets: Communication Skills; Desire for Improvement; Physical Health   Sleep  Sleep: Sleep: Fair  Estimated Sleeping Duration (Last 24 Hours): 12.00-15.75 hours  Physical Exam: Physical Exam ROS Blood pressure (!) 107/59, pulse 70, temperature 98.4 F (36.9 C), resp. rate 17, height 5' 10 (1.778 m), weight 76 kg, SpO2 98%. Body mass index is 24.03 kg/m.  Mental Status Per Nursing Assessment::   On Admission:  Suicidal ideation indicated by  patient  Demographic Factors:  Male  Loss Factors: Decrease in vocational status  Historical Factors: NA  Risk Reduction Factors:   Positive social support, Positive therapeutic relationship, and Positive coping skills or problem solving skills  Continued Clinical Symptoms:  Depression:   Comorbid alcohol  abuse/dependence  Cognitive Features That Contribute To Risk:  None    Suicide Risk:  Minimal: No identifiable suicidal ideation.  Patients presenting with no risk factors but with morbid ruminations; may be classified as minimal risk based on the severity of the depressive symptoms   Follow-up Information     [facility] Call on 07/18/2023.   Specialty: Addiction Medicine Why: Your appointment with [facility] is scheduled for 07/18/23 at 9:30 AM Contact information: [address] [place] [address] [phone]                 Plan Of Care/Follow-up recommendations:  Activity:  As tolerated  Allyn Foil, MD 07/18/2023, 7:32 AM

## 2023-07-18 NOTE — Plan of Care (Signed)
  Problem: Education: Goal: Utilization of techniques to improve thought processes will improve Outcome: Adequate for Discharge Goal: Knowledge of the prescribed therapeutic regimen will improve Outcome: Adequate for Discharge   Problem: Activity: Goal: Interest or engagement in leisure activities will improve Outcome: Adequate for Discharge Goal: Imbalance in normal sleep/wake cycle will improve Outcome: Adequate for Discharge   Problem: Coping: Goal: Coping ability will improve Outcome: Adequate for Discharge Goal: Will verbalize feelings Outcome: Adequate for Discharge   Problem: Health Behavior/Discharge Planning: Goal: Ability to make decisions will improve Outcome: Adequate for Discharge Goal: Compliance with therapeutic regimen will improve Outcome: Adequate for Discharge   Problem: Role Relationship: Goal: Will demonstrate positive changes in social behaviors and relationships Outcome: Adequate for Discharge   Problem: Safety: Goal: Ability to disclose and discuss suicidal ideas will improve Outcome: Adequate for Discharge Goal: Ability to identify and utilize support systems that promote safety will improve Outcome: Adequate for Discharge   Problem: Self-Concept: Goal: Will verbalize positive feelings about self Outcome: Adequate for Discharge Goal: Level of anxiety will decrease Outcome: Adequate for Discharge  Patient alert and oriented x 4.  Affect is pleasant and calm. Denies anxiety, SI/HI or AVH.  Patient states they will try to keep themselves safe when they return home.  Reviewed discharge instructions with patient including follow up appointment with provider, medication and prescriptions.  Questions answered and understanding verbalized.  Discharge packet given.  All belongings returned to patient after verification completed by staff.    Patient escorted by staff off unit at this time stable without complaint.

## 2023-07-18 NOTE — Progress Notes (Signed)
  [facility] Adult Case Management Discharge Plan :  Will you be returning to the same living situation after discharge:  No. At discharge, do you have transportation home?: Yes,  CSW will assist with transportation Do you have the ability to pay for your medications: Yes,  [employer] / [employer]  Release of information consent forms completed and in the chart;  Patient's signature needed at discharge.  Patient to Follow up at:  Follow-up Information     [facility] Call on 07/18/2023.   Specialty: Addiction Medicine Why: Your appointment with [facility] is scheduled for 07/18/23 at 9:30 AM Contact information: [address] [place] [address] [phone]                 Next level of care provider has access to [facility] Link:no  Safety Planning and Suicide Prevention discussed: Yes,  SPE gone over with pt at discharge      Has patient been referred to the Quitline?: Yes, faxed/e-referral on 07/18/23  Patient has been referred for addiction treatment: Yes, the patient will follow up with an outpatient provider for substance use disorder. Psychiatrist/APP: patient to schedule appointment  Franklin [name], LCSWA 07/18/2023, 6:57 AM

## 2023-07-18 NOTE — BHH Counselor (Signed)
 CSW contacted [facility] and spoke with Amber, and reported that pt left vehicle and is not coming to his scheduled appointment.   Lum Croft, MSW, [place] 07/18/2023 9:36 AM

## 2023-07-18 NOTE — Discharge Summary (Signed)
 Physician Discharge Summary Note  Patient:  Franklin [name] is an 53 y.o., male MRN:  [id number] DOB:  1970-09-30 Patient phone:  [phone] (home)  Patient address:   [place] [address],   Time spent more than 35 minutes Date of Admission:  07/09/2023 Date of Discharge: 07/18/23  Reason for Admission:  Patient is a 53 year old man with history of MDD, PTSD; alcohol  use disorder,stimulant use disorder whom presented to ED with c/o active suicidal ideation with plan to jump off a bridge. Patient is admitted to [facility] unit with Q15 min safety monitoring. Multidisciplinary team approach is offered. Medication management; group/milieu therapy is offered.   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   Past Psychiatric History: see h&p  Family Psychiatric  History: see h&p Social History:  Social History   Substance and Sexual Activity  Alcohol  Use Yes   Alcohol /week: 12.0 standard drinks of alcohol    Types: 12 Cans of beer per week     Social History   Substance and Sexual Activity  Drug Use Yes   Types: Crack cocaine, Cocaine   Comment: one gram daily    Social History   Socioeconomic History   Marital status: Single    Spouse name: Not on file   Number of children: Not on file   Years of education: Not on file   Highest education level: Not on file  Occupational History   Occupation: [employer]  Tobacco Use   Smoking status: Some Days    Current packs/day: 0.50    Types: Cigarettes   Smokeless tobacco: Never  Vaping Use   Vaping status: Never Used  Substance and Sexual Activity   Alcohol  use: Yes    Alcohol /week: 12.0 standard drinks of alcohol     Types: 12 Cans of beer per week   Drug use: Yes    Types: Crack cocaine, Cocaine    Comment: one gram daily   Sexual activity: Yes  Other Topics Concern   Not on file  Social History Narrative    The patient was born and raised in with that by both his biological parents. His father's passed away but his mother still living. He denies any history of any physical or sexual abuse. He says he completed 2 years of college at [employer]. 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 He has never been married but has a 73 year old son who lives with his mother. He says he does get to see his son and has a relationship with him. He is not currently dating or in a relationship.      The patient does have a history of a DUI and had a court date last Friday for a DUI. He denies any other pending charges.            Social Drivers of Corporate investment banker Strain: Not on file  Food Insecurity: Food Insecurity Present (07/09/2023)   Hunger Vital Sign    Worried About Running Out of Food in the Last Year: Often true    Ran Out of Food in the Last Year: Often true  Transportation Needs: Unmet Transportation Needs (07/09/2023)   PRAPARE - Administrator, Civil Service (Medical): Yes    Lack of Transportation (Non-Medical): Yes  Physical Activity: Not on file  Stress: Not on file  Social Connections: Socially  Isolated (07/09/2023)   Social Connection and Isolation Panel    Frequency of Communication with Friends and Family: Never    Frequency of Social Gatherings with Friends and Family: Never    Attends Religious Services: Never    Database administrator or Organizations: Yes    Attends Engineer, structural: Never    Marital Status: Never married   Past Medical History:  Past Medical History:  Diagnosis Date   A-fib (HCC)    Anxiety    COPD (chronic obstructive pulmonary disease) (HCC)    Depression    PTSD (post-traumatic stress disorder) 06/11/2023   Substance abuse (HCC)    Vitamin D  insufficiency 06/12/2023    Past Surgical History:  Procedure Laterality Date   NO PAST SURGERIES      Family History:  Family History  Problem Relation Age of Onset   Hypertension Other    Diabetes Other     Hospital Course:  Patient is a 53 year old man with history of MDD, PTSD; alcohol  use disorder,stimulant use disorder whom presented to ED with c/o active suicidal ideation with plan to jump off a bridge. Patient is admitted to [facility] unit with Q15 min safety monitoring. Multidisciplinary team approach is offered. Medication management; group/milieu therapy is offered.  Detailed risk assessment is complete based on clinical exam and individual risk factors and acute suicide risk is low and acute violence risk is low.     On admission,Patient was started on Prozac  20 mg and Abilify  5 mg titrated up to 10 mg to help with depression and hallucinations. Patient tolerated medications very well with no reported side effects. He initially isolated to his room but since his room was locked out he was able to participate in groups and worked with LCSW to secure a bed at [facility]. On the day of discharge he consistently denies SI/HI/plan/intent and remains future oriented as he is willing to participate in inpatient substance use program.  Later in the day the team received a phone call from the cab driver that picked up the patient to take him to rehab program that patient asked him to stop at a gas station for bathroom and when the cab driver stopped he jumped out of the car to get his belongings and ran away.  Leadership was notified.  Chart will be alerted for malingering and misuse of healthcare facilities for secondary gain including inpatient stay in the context of homelessness and substance use, without following up with outpatient recommendations  Currently, all modifiable risk of harm to self/harm to others have been addressed and patient is no longer appropriate for the acute inpatient setting and is able to continue treatment for mental health needs in the community with the supports as  indicated below.  Patient is educated and verbalized understanding of discharge plan of care including medications, follow-up appointments, mental health resources and further crisis services in the community.  He is instructed to call 911 or present to the nearest emergency room should he experience any decompensation in mood, disturbance of bowel or return of suicidal/homicidal ideations.  Patient verbalizes understanding of this education and agrees to this plan of care  Physical Findings: AIMS:  , ,  ,  ,    CIWA:    COWS:        Psychiatric Specialty Exam:  Presentation  General Appearance:  Appropriate for Environment; Casual  Eye Contact: Fair  Speech: Clear and Coherent  Speech Volume: Normal    Mood  and Affect  Mood: Euthymic  Affect: Appropriate   Thought Process  Thought Processes: Coherent  Descriptions of Associations:Intact  Orientation:Full (Time, Place and Person)  Thought Content:Logical  Hallucinations:Hallucinations: None  Ideas of Reference:None  Suicidal Thoughts:Suicidal Thoughts: No  Homicidal Thoughts:Homicidal Thoughts: No   Sensorium  Memory: Immediate Fair; Recent Fair; Remote Fair  Judgment: Fair  Insight: Fair   Art therapist  Concentration: Fair  Attention Span: Fair  Recall: [employer] of Knowledge: Fair  Language: Fair   Psychomotor Activity  Psychomotor Activity: Psychomotor Activity: Normal  Musculoskeletal: Strength & Muscle Tone: within normal limits Gait & Station: normal Assets  Assets: Manufacturing systems engineer; Desire for Improvement; Physical Health   Sleep  Sleep: Sleep: Fair Number of Hours of Sleep: 6    Physical Exam: Physical Exam ROS Blood pressure (!) 107/59, pulse 70, temperature 98.4 F (36.9 C), resp. rate 17, height 5' 10 (1.778 m), weight 76 kg, SpO2 98%. Body mass index is 24.03 kg/m.   Social History   Tobacco Use  Smoking Status Some Days   Current  packs/day: 0.50   Types: Cigarettes  Smokeless Tobacco Never   Tobacco Cessation:  N/A, patient does not currently use tobacco products   Blood Alcohol  level:  Lab Results  Component Value Date   [facility] <15 06/07/2023   ETH <10 02/27/2023    Metabolic Disorder Labs:  Lab Results  Component Value Date   HGBA1C 6.3 (H) 06/11/2023   MPG 134 06/11/2023   MPG 114.02 03/05/2023   No results found for: PROLACTIN Lab Results  Component Value Date   CHOL 184 06/11/2023   TRIG 98 06/11/2023   HDL 50 06/11/2023   CHOLHDL 3.7 06/11/2023   VLDL 20 06/11/2023   LDLCALC 114 (H) 06/11/2023   LDLCALC 105 (H) 03/05/2023    See Psychiatric Specialty Exam and Suicide Risk Assessment completed by Attending Physician prior to discharge.  Discharge destination:  [facility]  Is patient on multiple antipsychotic therapies at discharge:  No   Has Patient had three or more failed trials of antipsychotic monotherapy by history:  No  Recommended Plan for Multiple Antipsychotic Therapies: NA  Discharge Instructions     Diet - low sodium heart healthy   Complete by: As directed    Increase activity slowly   Complete by: As directed       Allergies as of 07/18/2023   No Known Allergies      Medication List     STOP taking these medications    doxycycline  100 MG capsule Commonly known as: VIBRAMYCIN    naltrexone  50 MG tablet Commonly known as: DEPADE   propranolol  10 MG tablet Commonly known as: INDERAL    traZODone  50 MG tablet Commonly known as: DESYREL        TAKE these medications      Indication  albuterol  108 (90 Base) MCG/ACT inhaler Commonly known as: VENTOLIN  HFA Inhale 1 puff into the lungs every 6 (six) hours as needed for wheezing or shortness of breath. What changed: how much to take    ARIPiprazole  10 MG tablet Commonly known as: ABILIFY  Take 1 tablet (10 mg total) by mouth daily.  Indication: Major Depressive Disorder   Breo Ellipta  100-25 MCG/ACT  Aepb Generic drug: fluticasone  furoate-vilanterol Inhale 1 puff into the lungs daily.  Indication: Chronic Obstructive Lung Disease   FLUoxetine  20 MG capsule Commonly known as: PROZAC  Take 1 capsule (20 mg total) by mouth daily.  Indication: Depression   gabapentin  300 MG capsule  Commonly known as: NEURONTIN  Take 1 capsule (300 mg total) by mouth 3 (three) times daily.    ipratropium-albuterol  0.5-2.5 (3) MG/3ML Soln Commonly known as: DUONEB Take 3 mLs by nebulization every 6 (six) hours as needed. What changed: reasons to take this    mometasone -formoterol  200-5 MCG/ACT Aero Commonly known as: DULERA  Inhale 2 puffs into the lungs 2 (two) times daily in the am and at bedtime..  Indication: Chronic Obstructive Lung Disease   multivitamin with minerals Tabs tablet Take 1 tablet by mouth daily.    thiamine  100 MG tablet Commonly known as: Vitamin B-1 Take 1 tablet (100 mg total) by mouth daily.        ASK your doctor about these medications      Indication  vitamin D3 25 MCG tablet Commonly known as: CHOLECALCIFEROL  Take 1 tablet (1,000 Units total) by mouth daily with breakfast. Ask about: Should I take this medication?         Follow-up Information     [facility] Call on 07/18/2023.   Specialty: Addiction Medicine Why: Your appointment with [facility] is scheduled for 07/18/23 at 9:30 AM Contact information: [address] [place] [address] [phone]                 Follow-up recommendations:  Activity:  As tolerated    Signed: Cesareo Vickrey, MD 07/18/2023, 9:11 AM

## 2023-07-18 NOTE — Progress Notes (Signed)
 Patient was up and visible on the unit. Appropriately interactive with both staff and peers. Appetite is fair. Safe behaviors displayed thus far. No complaints voiced. No distress noted/ reported. Slept throughout the majority of the night. All orders maintained as written.

## 2023-07-19 DIAGNOSIS — F332 Major depressive disorder, recurrent severe without psychotic features: Secondary | ICD-10-CM
